# Patient Record
Sex: Female | Born: 1940 | Race: Black or African American | Hispanic: No | Marital: Single | Smoking: Never smoker
Health system: Southern US, Community
[De-identification: ages and names within clinical notes are randomized; demographics above are authoritative.]

## PROBLEM LIST (undated history)

## (undated) DIAGNOSIS — I4892 Unspecified atrial flutter: Secondary | ICD-10-CM

## (undated) DIAGNOSIS — E119 Type 2 diabetes mellitus without complications: Secondary | ICD-10-CM

## (undated) DIAGNOSIS — M3211 Endocarditis in systemic lupus erythematosus: Secondary | ICD-10-CM

## (undated) DIAGNOSIS — IMO0002 Reserved for concepts with insufficient information to code with codable children: Secondary | ICD-10-CM

## (undated) DIAGNOSIS — M1990 Unspecified osteoarthritis, unspecified site: Secondary | ICD-10-CM

## (undated) DIAGNOSIS — M329 Systemic lupus erythematosus, unspecified: Secondary | ICD-10-CM

## (undated) DIAGNOSIS — R0600 Dyspnea, unspecified: Secondary | ICD-10-CM

## (undated) DIAGNOSIS — K219 Gastro-esophageal reflux disease without esophagitis: Secondary | ICD-10-CM

## (undated) DIAGNOSIS — Z923 Personal history of irradiation: Secondary | ICD-10-CM

## (undated) DIAGNOSIS — C50919 Malignant neoplasm of unspecified site of unspecified female breast: Secondary | ICD-10-CM

## (undated) DIAGNOSIS — E039 Hypothyroidism, unspecified: Secondary | ICD-10-CM

## (undated) DIAGNOSIS — I10 Essential (primary) hypertension: Secondary | ICD-10-CM

## (undated) HISTORY — DX: Malignant neoplasm of unspecified site of unspecified female breast: C50.919

## (undated) HISTORY — DX: Personal history of irradiation: Z92.3

---

## 2007-01-27 HISTORY — PX: BREAST LUMPECTOMY: SHX2

## 2014-06-20 ENCOUNTER — Encounter (HOSPITAL_COMMUNITY): Payer: Self-pay | Admitting: Emergency Medicine

## 2014-06-20 ENCOUNTER — Emergency Department (HOSPITAL_COMMUNITY): Payer: Medicare Other

## 2014-06-20 ENCOUNTER — Emergency Department (HOSPITAL_COMMUNITY)
Admission: EM | Admit: 2014-06-20 | Discharge: 2014-06-20 | Disposition: A | Discharge status: Home / Self Care | Payer: Medicare Other | Attending: Emergency Medicine | Admitting: Emergency Medicine

## 2014-06-20 DIAGNOSIS — E119 Type 2 diabetes mellitus without complications: Secondary | ICD-10-CM | POA: Insufficient documentation

## 2014-06-20 DIAGNOSIS — R350 Frequency of micturition: Secondary | ICD-10-CM | POA: Diagnosis not present

## 2014-06-20 DIAGNOSIS — I10 Essential (primary) hypertension: Secondary | ICD-10-CM | POA: Diagnosis not present

## 2014-06-20 DIAGNOSIS — R5383 Other fatigue: Secondary | ICD-10-CM | POA: Insufficient documentation

## 2014-06-20 DIAGNOSIS — Z87440 Personal history of urinary (tract) infections: Secondary | ICD-10-CM | POA: Insufficient documentation

## 2014-06-20 DIAGNOSIS — R0602 Shortness of breath: Secondary | ICD-10-CM | POA: Diagnosis not present

## 2014-06-20 DIAGNOSIS — Z8739 Personal history of other diseases of the musculoskeletal system and connective tissue: Secondary | ICD-10-CM | POA: Diagnosis not present

## 2014-06-20 DIAGNOSIS — R05 Cough: Secondary | ICD-10-CM | POA: Diagnosis not present

## 2014-06-20 DIAGNOSIS — R112 Nausea with vomiting, unspecified: Secondary | ICD-10-CM | POA: Insufficient documentation

## 2014-06-20 DIAGNOSIS — R42 Dizziness and giddiness: Secondary | ICD-10-CM | POA: Diagnosis not present

## 2014-06-20 HISTORY — DX: Type 2 diabetes mellitus without complications: E11.9

## 2014-06-20 HISTORY — DX: Reserved for concepts with insufficient information to code with codable children: IMO0002

## 2014-06-20 HISTORY — DX: Essential (primary) hypertension: I10

## 2014-06-20 HISTORY — DX: Systemic lupus erythematosus, unspecified: M32.9

## 2014-06-20 LAB — I-STAT TROPONIN, ED: Troponin i, poc: 0 ng/mL (ref 0.00–0.08)

## 2014-06-20 LAB — CBC WITH DIFFERENTIAL/PLATELET
BASOS PCT: 0 % (ref 0–1)
Basophils Absolute: 0 10*3/uL (ref 0.0–0.1)
EOS ABS: 0 10*3/uL (ref 0.0–0.7)
EOS PCT: 1 % (ref 0–5)
HEMATOCRIT: 35 % — AB (ref 36.0–46.0)
Hemoglobin: 11.5 g/dL — ABNORMAL LOW (ref 12.0–15.0)
Lymphocytes Relative: 28 % (ref 12–46)
Lymphs Abs: 1.3 10*3/uL (ref 0.7–4.0)
MCH: 28.2 pg (ref 26.0–34.0)
MCHC: 32.9 g/dL (ref 30.0–36.0)
MCV: 85.8 fL (ref 78.0–100.0)
Monocytes Absolute: 0.3 10*3/uL (ref 0.1–1.0)
Monocytes Relative: 7 % (ref 3–12)
NEUTROS ABS: 2.9 10*3/uL (ref 1.7–7.7)
NEUTROS PCT: 64 % (ref 43–77)
Platelets: 217 10*3/uL (ref 150–400)
RBC: 4.08 MIL/uL (ref 3.87–5.11)
RDW: 14.2 % (ref 11.5–15.5)
WBC: 4.6 10*3/uL (ref 4.0–10.5)

## 2014-06-20 LAB — URINALYSIS, ROUTINE W REFLEX MICROSCOPIC
Bilirubin Urine: NEGATIVE
Glucose, UA: NEGATIVE mg/dL
Hgb urine dipstick: NEGATIVE
KETONES UR: NEGATIVE mg/dL
Nitrite: NEGATIVE
PH: 6 (ref 5.0–8.0)
PROTEIN: NEGATIVE mg/dL
Specific Gravity, Urine: 1.008 (ref 1.005–1.030)
UROBILINOGEN UA: 0.2 mg/dL (ref 0.0–1.0)

## 2014-06-20 LAB — BASIC METABOLIC PANEL
Anion gap: 9 (ref 5–15)
BUN: 17 mg/dL (ref 6–20)
CO2: 22 mmol/L (ref 22–32)
CREATININE: 1.23 mg/dL — AB (ref 0.44–1.00)
Calcium: 10.2 mg/dL (ref 8.9–10.3)
Chloride: 99 mmol/L — ABNORMAL LOW (ref 101–111)
GFR calc Af Amer: 50 mL/min — ABNORMAL LOW (ref >60)
GFR calc non Af Amer: 43 mL/min — ABNORMAL LOW (ref >60)
Glucose, Bld: 114 mg/dL — ABNORMAL HIGH (ref 65–99)
POTASSIUM: 4.6 mmol/L (ref 3.5–5.1)
SODIUM: 130 mmol/L — AB (ref 135–145)

## 2014-06-20 LAB — URINE MICROSCOPIC-ADD ON

## 2014-06-20 MED ORDER — SODIUM CHLORIDE 0.9 % IV BOLUS (SEPSIS)
1000.0000 mL | Freq: Once | INTRAVENOUS | Status: AC
  Administered 2014-06-20: 1000 mL via INTRAVENOUS

## 2014-06-20 NOTE — ED Notes (Signed)
Family requesting something to eat due to patient being diabetic.  Dr. Sherry Ruffing notified

## 2014-06-20 NOTE — ED Notes (Signed)
Pt c/o dizziness and nausea x 2 days; pt recently moved here due to decline in health to live with family; pt was recently hospitalized for urosepsis; pt denies urinary sx

## 2014-06-20 NOTE — Discharge Instructions (Signed)

## 2014-06-20 NOTE — ED Notes (Signed)
Discharge instructions reviewed with patient and daughter, voiced understanding

## 2014-06-20 NOTE — ED Provider Notes (Signed)
CSN: [id number]     Arrival date & time 06/20/14  1238 History   First MD Initiated Contact with Patient 06/20/14 1531     Chief Complaint  Patient presents with  . Dizziness   [name] is a 74 y.o. female with a past medical history significant for hypertension, diabetes, lupus, and recent multiple admissions for urosepsis discharge 2 weeks ago who presents with shortness of breath, fatigue, lightheadedness, nausea with vomiting, and frequency. The patient's currently by her daughter reports that the patient was discharged 2 weeks ago from a facility in [place] where she was found to be uroseptic. The patient says that since discharge, she is continued to feel short of breath with exertion and generalized fatigue. The patient says that she is more concerned because for the last 2 days, she has had lightheadedness with standing and occasional sensation of vertigo. The patient also has had nausea and vomiting for the last 2 days. The patient denies any fevers or chills, denies chest pain, and otherwise says she has had normal bowel movements. The patient denies dysuria, change in urine smell or color but does say she has had frequency.  The patient denies any other complaints on arrival.   (Consider location/radiation/quality/duration/timing/severity/associated sxs/prior Treatment) Patient is a 74 y.o. female presenting with dizziness. The history is provided by the patient and a relative (daughter). No language interpreter was used.  Dizziness Quality:  Head spinning and lightheadedness Severity:  Moderate Onset quality:  Gradual Duration:  2 days Timing:  Constant Progression:  Waxing and waning Chronicity:  Recurrent Context: standing up   Context: not with loss of consciousness   Relieved by:  Nothing Worsened by:  Nothing Ineffective treatments:  None tried Associated symptoms: nausea, shortness of breath and vomiting   Associated symptoms: no chest pain, no  diarrhea, no headaches, no palpitations, no syncope and no vision changes   Nausea:    Severity:  Moderate   Onset quality:  Gradual   Duration:  2 days   Timing:  Intermittent   Progression:  Waxing and waning Shortness of breath:    Severity:  Mild   Onset quality:  Gradual   Duration:  2 weeks   Timing:  Intermittent   Progression:  Waxing and waning Vomiting:    Quality:  Stomach contents   Number of occurrences:  Several   Severity:  Mild   Duration:  2 days   Timing:  Intermittent   Progression:  Unchanged   Past Medical History  Diagnosis Date  . Hypertension   . Diabetes mellitus without complication   . Lupus    History reviewed. No pertinent past surgical history. History reviewed. No pertinent family history. History  Substance Use Topics  . Smoking status: Never Smoker   . Smokeless tobacco: Not on file  . Alcohol Use: No   OB History    No data available     Review of Systems  Constitutional: Positive for fatigue. Negative for fever, chills and diaphoresis.  HENT: Negative for congestion.   Eyes: Negative for visual disturbance.  Respiratory: Positive for cough (dry) and shortness of breath. Negative for choking, chest tightness, wheezing and stridor.   Cardiovascular: Negative for chest pain, palpitations, leg swelling and syncope.  Gastrointestinal: Positive for nausea and vomiting. Negative for abdominal pain, diarrhea and constipation.  Genitourinary: Positive for frequency. Negative for dysuria and flank pain.  Musculoskeletal: Negative for back pain.  Skin: Negative for rash and wound.  Neurological: Positive  for dizziness and light-headedness. Negative for seizures, syncope and headaches.  Psychiatric/Behavioral: Negative for agitation.  All other systems reviewed and are negative.     Allergies  Ciprofloxacin  Home Medications   Prior to Admission medications   Not on File   BP 129/79 mmHg  Pulse 77  Temp(Src) 97.5 F (36.4 C)  (Oral)  Resp 18  SpO2 98% Physical Exam  Constitutional: [name], place, and time. She appears well-developed and well-nourished.  HENT:  Head: Normocephalic and atraumatic.  Mouth/Throat: No oropharyngeal exudate.  Eyes: Conjunctivae are normal. Pupils are equal, round, and reactive to light.  Neck: Normal range of motion.  Cardiovascular: Normal rate, regular rhythm, normal heart sounds and intact distal pulses.   No murmur heard. Pulmonary/Chest: Effort normal. No stridor. No respiratory distress. She has no wheezes. She exhibits no tenderness.  Abdominal: Soft. Bowel sounds are normal. She exhibits no distension. There is no tenderness. There is no rebound.  Musculoskeletal: Normal range of motion. She exhibits no tenderness.  Neurological: She is alert and oriented to person, place, and time. She exhibits normal muscle tone.  Skin: Skin is warm. She is not diaphoretic. No pallor.  Psychiatric: She has a normal mood and affect.  Nursing note and vitals reviewed.   ED Course  Procedures (including critical care time) Labs Review Labs Reviewed  BASIC METABOLIC PANEL - Abnormal; Notable for the following:    Sodium 130 (*)    Chloride 99 (*)    Glucose, Bld 114 (*)    Creatinine, Ser 1.23 (*)    GFR calc non Af Amer 43 (*)    GFR calc Af Amer 50 (*)    All other components within normal limits  CBC WITH DIFFERENTIAL/PLATELET - Abnormal; Notable for the following:    Hemoglobin 11.5 (*)    HCT 35.0 (*)    All other components within normal limits  URINALYSIS, ROUTINE W REFLEX MICROSCOPIC  I-STAT TROPOININ, ED    Imaging Review Dg Chest 2 View  06/20/2014   CLINICAL DATA:  Dizziness.  Lupus.  Hypertension.  Diabetes.  EXAM: CHEST  2 VIEW  COMPARISON:  None.  FINDINGS: Fine interstitial accentuation in the lung bases. Mildly tortuous thoracic aorta. Mild enlargement of the cardiopericardial silhouette  IMPRESSION: 1. Mild enlargement of the cardiopericardial  silhouette, without edema. 2. Fine interstitial accentuation in the lung bases. Causes other than chronic fibrosis include rheumatoid lung, idiopathic pulmonary fibrosis, drug reaction, bronchiectasis, chronic aspiration, dermatomyositis, sarcoidosis, scleroderma, and a variety of other conditions.   Electronically Signed   By: Van Clines M.D.   On: 06/20/2014 14:14     EKG Interpretation None      MDM   Caidynce Muzyka is a 74 y.o. female with a past medical history significant for hypertension, diabetes, lupus, and recent multiple admissions for urosepsis discharge 2 weeks ago who presents with shortness of breath, fatigue, lightheadedness, nausea with vomiting, and frequency. Given the patient's recent infection, there is concern for possible infectious etiology of her dizziness. The patient had diagnostic laboratory and imaging studies ordered. The patient did not have any chest pain or shortness of breath while in the ED.  The patient's diagnostic laboratory testing results are seen above. The patient's urinalysis showed moderate leukocytes however, there was many epithelial cells. The urine was nitrite negative and the patient denied other urinary symptoms aside from mild frequency. The patient reported that during her last UTI, she had dysuria which has resolved. She  is felt not to have a UTI this time. The patient's troponin was negative, her EKG did not show evidence of ischemia, Her CBC did not show a leukocytosis and her hemoglobin was 11.5. The patient's BNP showed mild hyponatremia and a creatinine of 1.2. The patient's chest x-ray did not show a focal consolidation concerning for pneumonia But did show mild enlargement of the pericardial silhouette. There was also some fine interstitial accentuation in the lung basis concerning for chronic changes.   7:57 PM On reassessment, the patient's daughter reports that the patient had a fall several days ago both in her bathroom and in the  kitchen where she hit her head on the wall. The daughter reports that the patient's dizziness worsened following that injury. In light of this information, a CT scan of the head was ordered.  The patient CT scan of the head did not show any acute abnormality aside from age-related volume loss.  The patient and her daughter were reassured with the diagnostic workup results. The patient was given fluids and was PO challenged. The patient was able to tolerate crackers, fluids, and a sandwich. The patient felt resolution of her lightheadedness following the fluid resuscitation and food. The patient was reassured that she did not appear to have an infection at this time however, she may be slightly dehydrated due to the increased fluid requirements following her recent infection. The patient was instructed to follow up with her PCP in the next several days. The patient's daughter reports that she will call them in the morning to set up an appointment. The patient was given return precautions for new or worsening symptoms. The patient and her family voice understanding of the plan of care, had no other questions, concerns, or complaints, and the patient was discharged in good condition.  This patient was seen with Dr. Aline Brochure, emergency medicine attending.    Final diagnoses:  Lightheaded       Antony Blackbird, MD 06/21/14 [id number]  Pamella Pert, MD 06/21/14 [phone]

## 2015-01-02 ENCOUNTER — Encounter (HOSPITAL_COMMUNITY): Payer: Self-pay | Admitting: Emergency Medicine

## 2015-01-02 ENCOUNTER — Inpatient Hospital Stay (HOSPITAL_COMMUNITY)
Admission: EM | Admit: 2015-01-02 | Discharge: 2015-01-06 | DRG: 864 | Disposition: A | Discharge status: Home / Self Care | Payer: Medicare Other | Attending: Internal Medicine | Admitting: Internal Medicine

## 2015-01-02 ENCOUNTER — Emergency Department (HOSPITAL_COMMUNITY): Payer: Medicare Other

## 2015-01-02 DIAGNOSIS — I129 Hypertensive chronic kidney disease with stage 1 through stage 4 chronic kidney disease, or unspecified chronic kidney disease: Secondary | ICD-10-CM | POA: Diagnosis present

## 2015-01-02 DIAGNOSIS — K219 Gastro-esophageal reflux disease without esophagitis: Secondary | ICD-10-CM | POA: Diagnosis present

## 2015-01-02 DIAGNOSIS — R7989 Other specified abnormal findings of blood chemistry: Secondary | ICD-10-CM

## 2015-01-02 DIAGNOSIS — R509 Fever, unspecified: Secondary | ICD-10-CM | POA: Diagnosis not present

## 2015-01-02 DIAGNOSIS — I10 Essential (primary) hypertension: Secondary | ICD-10-CM | POA: Diagnosis present

## 2015-01-02 DIAGNOSIS — R502 Drug induced fever: Principal | ICD-10-CM | POA: Diagnosis present

## 2015-01-02 DIAGNOSIS — E1122 Type 2 diabetes mellitus with diabetic chronic kidney disease: Secondary | ICD-10-CM | POA: Diagnosis present

## 2015-01-02 DIAGNOSIS — D638 Anemia in other chronic diseases classified elsewhere: Secondary | ICD-10-CM | POA: Diagnosis present

## 2015-01-02 DIAGNOSIS — R112 Nausea with vomiting, unspecified: Secondary | ICD-10-CM | POA: Diagnosis present

## 2015-01-02 DIAGNOSIS — M329 Systemic lupus erythematosus, unspecified: Secondary | ICD-10-CM | POA: Diagnosis present

## 2015-01-02 DIAGNOSIS — L298 Other pruritus: Secondary | ICD-10-CM | POA: Diagnosis present

## 2015-01-02 DIAGNOSIS — D649 Anemia, unspecified: Secondary | ICD-10-CM

## 2015-01-02 DIAGNOSIS — N183 Chronic kidney disease, stage 3 (moderate): Secondary | ICD-10-CM | POA: Diagnosis present

## 2015-01-02 DIAGNOSIS — R51 Headache: Secondary | ICD-10-CM | POA: Diagnosis present

## 2015-01-02 DIAGNOSIS — Y92009 Unspecified place in unspecified non-institutional (private) residence as the place of occurrence of the external cause: Secondary | ICD-10-CM

## 2015-01-02 DIAGNOSIS — T368X5A Adverse effect of other systemic antibiotics, initial encounter: Secondary | ICD-10-CM | POA: Diagnosis present

## 2015-01-02 DIAGNOSIS — IMO0002 Reserved for concepts with insufficient information to code with codable children: Secondary | ICD-10-CM | POA: Diagnosis present

## 2015-01-02 DIAGNOSIS — Z7984 Long term (current) use of oral hypoglycemic drugs: Secondary | ICD-10-CM

## 2015-01-02 DIAGNOSIS — N289 Disorder of kidney and ureter, unspecified: Secondary | ICD-10-CM

## 2015-01-02 DIAGNOSIS — E039 Hypothyroidism, unspecified: Secondary | ICD-10-CM | POA: Diagnosis present

## 2015-01-02 DIAGNOSIS — N390 Urinary tract infection, site not specified: Secondary | ICD-10-CM | POA: Diagnosis present

## 2015-01-02 DIAGNOSIS — T7840XA Allergy, unspecified, initial encounter: Secondary | ICD-10-CM

## 2015-01-02 HISTORY — DX: Hypothyroidism, unspecified: E03.9

## 2015-01-02 LAB — URINALYSIS, ROUTINE W REFLEX MICROSCOPIC
Bilirubin Urine: NEGATIVE
GLUCOSE, UA: NEGATIVE mg/dL
Hgb urine dipstick: NEGATIVE
KETONES UR: NEGATIVE mg/dL
Nitrite: NEGATIVE
Protein, ur: NEGATIVE mg/dL
Specific Gravity, Urine: 1.012 (ref 1.005–1.030)
pH: 7.5 (ref 5.0–8.0)

## 2015-01-02 LAB — CBC WITH DIFFERENTIAL/PLATELET
BASOS PCT: 0 %
Basophils Absolute: 0 10*3/uL (ref 0.0–0.1)
Eosinophils Absolute: 0 10*3/uL (ref 0.0–0.7)
Eosinophils Relative: 0 %
HEMATOCRIT: 34.5 % — AB (ref 36.0–46.0)
HEMOGLOBIN: 11.1 g/dL — AB (ref 12.0–15.0)
Lymphocytes Relative: 18 %
Lymphs Abs: 1.3 10*3/uL (ref 0.7–4.0)
MCH: 28.7 pg (ref 26.0–34.0)
MCHC: 32.2 g/dL (ref 30.0–36.0)
MCV: 89.1 fL (ref 78.0–100.0)
MONOS PCT: 3 %
Monocytes Absolute: 0.2 10*3/uL (ref 0.1–1.0)
NEUTROS ABS: 5.7 10*3/uL (ref 1.7–7.7)
Neutrophils Relative %: 79 %
Platelets: 215 10*3/uL (ref 150–400)
RBC: 3.87 MIL/uL (ref 3.87–5.11)
RDW: 14 % (ref 11.5–15.5)
WBC: 7.2 10*3/uL (ref 4.0–10.5)

## 2015-01-02 LAB — COMPREHENSIVE METABOLIC PANEL
ALT: 13 U/L — AB (ref 14–54)
AST: 25 U/L (ref 15–41)
Albumin: 4.3 g/dL (ref 3.5–5.0)
Alkaline Phosphatase: 56 U/L (ref 38–126)
Anion gap: 10 (ref 5–15)
BUN: 12 mg/dL (ref 6–20)
CALCIUM: 9.8 mg/dL (ref 8.9–10.3)
CHLORIDE: 99 mmol/L — AB (ref 101–111)
CO2: 25 mmol/L (ref 22–32)
CREATININE: 1.29 mg/dL — AB (ref 0.44–1.00)
GFR calc Af Amer: 47 mL/min — ABNORMAL LOW (ref >60)
GFR, EST NON AFRICAN AMERICAN: 40 mL/min — AB (ref >60)
Glucose, Bld: 124 mg/dL — ABNORMAL HIGH (ref 65–99)
Potassium: 3.9 mmol/L (ref 3.5–5.1)
Sodium: 134 mmol/L — ABNORMAL LOW (ref 135–145)
Total Bilirubin: 0.4 mg/dL (ref 0.3–1.2)
Total Protein: 8.4 g/dL — ABNORMAL HIGH (ref 6.5–8.1)

## 2015-01-02 LAB — I-STAT CG4 LACTIC ACID, ED
LACTIC ACID, VENOUS: 2.5 mmol/L — AB (ref 0.5–2.0)
Lactic Acid, Venous: 0.71 mmol/L (ref 0.5–2.0)

## 2015-01-02 LAB — URINE MICROSCOPIC-ADD ON

## 2015-01-02 LAB — CBG MONITORING, ED: Glucose-Capillary: 84 mg/dL (ref 65–99)

## 2015-01-02 MED ORDER — IBUPROFEN 200 MG PO TABS
200.0000 mg | ORAL_TABLET | Freq: Four times a day (QID) | ORAL | Status: DC | PRN
  Administered 2015-01-03 – 2015-01-04 (×2): 200 mg via ORAL
  Filled 2015-01-02 (×2): qty 1

## 2015-01-02 MED ORDER — PANTOPRAZOLE SODIUM 40 MG PO TBEC
40.0000 mg | DELAYED_RELEASE_TABLET | Freq: Every day | ORAL | Status: DC
  Administered 2015-01-03 – 2015-01-06 (×4): 40 mg via ORAL
  Filled 2015-01-02 (×4): qty 1

## 2015-01-02 MED ORDER — ACETAMINOPHEN 325 MG PO TABS
650.0000 mg | ORAL_TABLET | Freq: Four times a day (QID) | ORAL | Status: DC | PRN
  Administered 2015-01-03 – 2015-01-04 (×2): 650 mg via ORAL
  Filled 2015-01-02 (×3): qty 2

## 2015-01-02 MED ORDER — INSULIN ASPART 100 UNIT/ML ~~LOC~~ SOLN: 0.0000 [IU] | Freq: Three times a day (TID) | SUBCUTANEOUS | Status: DC

## 2015-01-02 MED ORDER — DEXTROSE 5 % IV SOLN
1.0000 g | Freq: Once | INTRAVENOUS | Status: AC
  Administered 2015-01-02: 1 g via INTRAVENOUS
  Filled 2015-01-02: qty 10

## 2015-01-02 MED ORDER — ONDANSETRON 4 MG PO TBDP
4.0000 mg | ORAL_TABLET | Freq: Once | ORAL | Status: AC | PRN
  Administered 2015-01-02: 4 mg via ORAL

## 2015-01-02 MED ORDER — HYDROXYCHLOROQUINE SULFATE 200 MG PO TABS
200.0000 mg | ORAL_TABLET | Freq: Every day | ORAL | Status: DC
  Administered 2015-01-03 – 2015-01-06 (×4): 200 mg via ORAL
  Filled 2015-01-02 (×4): qty 1

## 2015-01-02 MED ORDER — SODIUM CHLORIDE 0.9 % IV SOLN
INTRAVENOUS | Status: AC
  Administered 2015-01-03: via INTRAVENOUS

## 2015-01-02 MED ORDER — LEVOTHYROXINE SODIUM 50 MCG PO TABS
50.0000 ug | ORAL_TABLET | Freq: Every day | ORAL | Status: DC
  Administered 2015-01-03 – 2015-01-06 (×4): 50 ug via ORAL
  Filled 2015-01-02 (×4): qty 1

## 2015-01-02 MED ORDER — SODIUM CHLORIDE 0.9 % IV SOLN
1000.0000 mL | Freq: Once | INTRAVENOUS | Status: AC
  Administered 2015-01-02: 1000 mL via INTRAVENOUS

## 2015-01-02 MED ORDER — ACETAMINOPHEN 650 MG RE SUPP: 650.0000 mg | Freq: Four times a day (QID) | RECTAL | Status: DC | PRN

## 2015-01-02 MED ORDER — SODIUM CHLORIDE 0.9 % IV SOLN
1000.0000 mL | INTRAVENOUS | Status: DC
  Administered 2015-01-02: 1000 mL via INTRAVENOUS

## 2015-01-02 MED ORDER — ONDANSETRON 4 MG PO TBDP
ORAL_TABLET | ORAL | Status: AC
  Filled 2015-01-02: qty 1

## 2015-01-02 MED ORDER — SODIUM CHLORIDE 0.9 % IV BOLUS (SEPSIS)
500.0000 mL | Freq: Once | INTRAVENOUS | Status: AC
  Administered 2015-01-02: 500 mL via INTRAVENOUS

## 2015-01-02 MED ORDER — ENOXAPARIN SODIUM 40 MG/0.4ML ~~LOC~~ SOLN
40.0000 mg | SUBCUTANEOUS | Status: DC
  Administered 2015-01-03 – 2015-01-04 (×2): 40 mg via SUBCUTANEOUS
  Filled 2015-01-02 (×2): qty 0.4

## 2015-01-02 MED ORDER — ACETAMINOPHEN 325 MG PO TABS
650.0000 mg | ORAL_TABLET | Freq: Once | ORAL | Status: AC | PRN
  Administered 2015-01-02: 650 mg via ORAL
  Filled 2015-01-02: qty 2

## 2015-01-02 NOTE — ED Notes (Signed)
Dr. Roxanne Mins informed of patient's lactic acid level and vital signs.

## 2015-01-02 NOTE — ED Notes (Addendum)
Pt states she took a antibiotic for urinary tract infection today around 430pm and right after started itching all over and having tremors. No obvious rash is noted but pt states she just itching everywhere and also has a headache. Pt states she has itching in her throat but no difficulty speaking or trouble swallowing.

## 2015-01-02 NOTE — ED Provider Notes (Signed)
CSN: [id number]     Arrival date & time 01/02/15  1745 History   First MD Initiated Contact with Patient 01/02/15 2021     Chief Complaint  Patient presents with  . Pruritis     (Consider location/radiation/quality/duration/timing/severity/associated sxs/prior Treatment) The history is provided by the patient and a relative.   74 year old female has been having waxing and waning confusion at home. She was more confused yesterday than she had been previously. She was taken to her PCP today who did a urinalysis and diagnosed urinary tract infection and started her on ciprofloxacin. She is allergic to ciprofloxacin and started having itching following that. Family states that she is in general better today than she had been. She denies fever at home and denies chills and sweats. She denies cough. She did vomit once in triage but had not vomited prior to that. She denies constipation or diarrhea. She denies urinary urgency, frequency, tenesmus, urgency.  Past Medical History  Diagnosis Date  . Hypertension   . Diabetes mellitus without complication ([place])   . Lupus ([place])    History reviewed. No pertinent past surgical history. No family history on file. Social History  Substance Use Topics  . Smoking status: Never Smoker   . Smokeless tobacco: None  . Alcohol Use: No   OB History    No data available     Review of Systems  All other systems reviewed and are negative.     Allergies  Ciprofloxacin  Home Medications   Prior to Admission medications   Medication Sig Start Date End Date Taking? Authorizing Provider  glimepiride (AMARYL) 1 MG tablet Take 1 mg by mouth daily with breakfast.   Yes Historical Provider, MD  levothyroxine (SYNTHROID, LEVOTHROID) 50 MCG tablet Take 50 mcg by mouth daily before breakfast.   Yes Historical Provider, MD  lisinopril-hydrochlorothiazide (PRINZIDE,ZESTORETIC) 20-12.5 MG per tablet Take 1 tablet by mouth.    Yes Historical Provider, MD   omeprazole (PRILOSEC) 20 MG capsule Take 20 mg by mouth daily.   Yes Historical Provider, MD  glucose 5 G chewable tablet Chew 15 g by mouth as needed for low blood sugar.    Historical Provider, MD  hydroxychloroquine (PLAQUENIL) 200 MG tablet Take 200 mg by mouth daily.    Historical Provider, MD   BP 147/75 mmHg  Pulse 109  Temp(Src) 103.2 F (39.6 C) (Rectal)  Resp 20  Ht 5\' 6"  (1.676 m)  Wt 150 lb (68.04 kg)  BMI 24.22 kg/m2  SpO2 98% Physical Exam  Nursing note and vitals reviewed.  74 year old female, resting comfortably and in no acute distress. Vital signs are significant for fever and tachycardia and hypertension. Oxygen saturation is 98%, which is normal. Head is normocephalic and atraumatic. PERRLA, EOMI. Oropharynx is clear. Neck is nontender and supple without adenopathy or JVD. Back is nontender and there is no CVA tenderness. Lungs are clear without rales, wheezes, or rhonchi. Chest is nontender. Heart has regular rate and rhythm without murmur. Abdomen is soft, flat, nontender without masses or hepatosplenomegaly and peristalsis is normoactive. Extremities have no cyanosis or edema, full range of motion is present. Skin is warm and dry without rash. Neurologic: Mental status is normal - she is alert and oriented 3, cranial nerves are intact, there are no motor or sensory deficits.  ED Course  Procedures (including critical care time) Labs Review Results for orders placed or performed during the hospital encounter of 01/02/15  Comprehensive metabolic panel  Result Value  Ref Range   Sodium 134 (L) 135 - 145 mmol/L   Potassium 3.9 3.5 - 5.1 mmol/L   Chloride 99 (L) 101 - 111 mmol/L   CO2 25 22 - 32 mmol/L   Glucose, Bld 124 (H) 65 - 99 mg/dL   BUN 12 6 - 20 mg/dL   Creatinine, Ser 1.29 (H) 0.44 - 1.00 mg/dL   Calcium 9.8 8.9 - 10.3 mg/dL   Total Protein 8.4 (H) 6.5 - 8.1 g/dL   Albumin 4.3 3.5 - 5.0 g/dL   AST 25 15 - 41 U/L   ALT 13 (L) 14 - 54 U/L    Alkaline Phosphatase 56 38 - 126 U/L   Total Bilirubin 0.4 0.3 - 1.2 mg/dL   GFR calc non Af Amer 40 (L) >60 mL/min   GFR calc Af Amer 47 (L) >60 mL/min   Anion gap 10 5 - 15  CBC with Differential  Result Value Ref Range   WBC 7.2 4.0 - 10.5 K/uL   RBC 3.87 3.87 - 5.11 MIL/uL   Hemoglobin 11.1 (L) 12.0 - 15.0 g/dL   HCT 34.5 (L) 36.0 - 46.0 %   MCV 89.1 78.0 - 100.0 fL   MCH 28.7 26.0 - 34.0 pg   MCHC 32.2 30.0 - 36.0 g/dL   RDW 14.0 11.5 - 15.5 %   Platelets 215 150 - 400 K/uL   Neutrophils Relative % 79 %   Neutro Abs 5.7 1.7 - 7.7 K/uL   Lymphocytes Relative 18 %   Lymphs Abs 1.3 0.7 - 4.0 K/uL   Monocytes Relative 3 %   Monocytes Absolute 0.2 0.1 - 1.0 K/uL   Eosinophils Relative 0 %   Eosinophils Absolute 0.0 0.0 - 0.7 K/uL   Basophils Relative 0 %   Basophils Absolute 0.0 0.0 - 0.1 K/uL  Urinalysis, Routine w reflex microscopic (not at [facility])  Result Value Ref Range   Color, Urine YELLOW YELLOW   APPearance CLEAR CLEAR   Specific Gravity, Urine 1.012 1.005 - 1.030   pH 7.5 5.0 - 8.0   Glucose, UA NEGATIVE NEGATIVE mg/dL   Hgb urine dipstick NEGATIVE NEGATIVE   Bilirubin Urine NEGATIVE NEGATIVE   Ketones, ur NEGATIVE NEGATIVE mg/dL   Protein, ur NEGATIVE NEGATIVE mg/dL   Nitrite NEGATIVE NEGATIVE   Leukocytes, UA TRACE (A) NEGATIVE  Urine microscopic-add on  Result Value Ref Range   Squamous Epithelial / LPF 6-30 (A) NONE SEEN   WBC, UA 0-5 0 - 5 WBC/hpf   RBC / HPF 0-5 0 - 5 RBC/hpf   Bacteria, UA RARE (A) NONE SEEN  CBG monitoring, ED  Result Value Ref Range   Glucose-Capillary 84 65 - 99 mg/dL  I-Stat CG4 Lactic Acid, ED (Not at [facility])  Result Value Ref Range   Lactic Acid, Venous 2.50 (HH) 0.5 - 2.0 mmol/L   Comment NOTIFIED PHYSICIAN    Imaging Review Dg Chest 2 View  01/02/2015  CLINICAL DATA:  Fever.  Weakness.  Confusion. EXAM: CHEST  2 VIEW COMPARISON:  06/20/2014 6C FINDINGS: Mild cardiomegaly is stable. No evidence of congestive heart failure.  Chronic interstitial prominence noted in both lung bases. No evidence of acute infiltrate or edema. No evidence of pneumothorax or pleural effusion. IMPRESSION: Stable cardiomegaly and chronic bibasilar interstitial prominence. No acute findings. Electronically Signed   By: Earle Gell M.D.   On: 01/02/2015 20:37   I have personally reviewed and evaluated these images and lab results as part of my medical  decision-making.  MDM   Final diagnoses:  Elevated lactic acid level  Fever, unspecified  Renal insufficiency  Normochromic normocytic anemia    Fever with elevated lactic acid. She has minimal other criteria for sepsis. She is started on early goal-directed fluid therapy and is started on antibiotics for presumed urinary tract infection. Chest x-ray shows no evidence of pneumonia. Urine culture may not show her pathogen because of the dose of ciprofloxacin prior to coming to the ED. She is started on ceftriaxone for presumed urinary tract infection.  Urinalysis actually does not show clear evidence of UTI. WBC is normal. There is mild renal insufficiency present which is unchanged from baseline and mild anemia present unchanged from baseline. It is possible that fever is a drug fever from taking ciprofloxacin although that would not account for her elevated lactic acid. Lactic acid level will be reviewed. After completion of early goal-directed fluid therapy but patient does not appear clinically septic at this point. Case is discussed with Dr. Tamala Julian of [facility] who agrees to admit the patient under observation status.  Delora Fuel, MD [id number] [id number]

## 2015-01-03 ENCOUNTER — Encounter (HOSPITAL_COMMUNITY): Payer: Self-pay | Admitting: *Deleted

## 2015-01-03 DIAGNOSIS — M329 Systemic lupus erythematosus, unspecified: Secondary | ICD-10-CM | POA: Diagnosis present

## 2015-01-03 DIAGNOSIS — L298 Other pruritus: Secondary | ICD-10-CM | POA: Diagnosis present

## 2015-01-03 DIAGNOSIS — Y92009 Unspecified place in unspecified non-institutional (private) residence as the place of occurrence of the external cause: Secondary | ICD-10-CM | POA: Diagnosis not present

## 2015-01-03 DIAGNOSIS — N390 Urinary tract infection, site not specified: Secondary | ICD-10-CM | POA: Diagnosis present

## 2015-01-03 DIAGNOSIS — R51 Headache: Secondary | ICD-10-CM | POA: Diagnosis present

## 2015-01-03 DIAGNOSIS — T7840XA Allergy, unspecified, initial encounter: Secondary | ICD-10-CM

## 2015-01-03 DIAGNOSIS — N183 Chronic kidney disease, stage 3 (moderate): Secondary | ICD-10-CM

## 2015-01-03 DIAGNOSIS — D638 Anemia in other chronic diseases classified elsewhere: Secondary | ICD-10-CM | POA: Diagnosis present

## 2015-01-03 DIAGNOSIS — E039 Hypothyroidism, unspecified: Secondary | ICD-10-CM | POA: Diagnosis present

## 2015-01-03 DIAGNOSIS — R509 Fever, unspecified: Secondary | ICD-10-CM | POA: Diagnosis present

## 2015-01-03 DIAGNOSIS — I10 Essential (primary) hypertension: Secondary | ICD-10-CM | POA: Diagnosis present

## 2015-01-03 DIAGNOSIS — N3000 Acute cystitis without hematuria: Secondary | ICD-10-CM

## 2015-01-03 DIAGNOSIS — R112 Nausea with vomiting, unspecified: Secondary | ICD-10-CM | POA: Diagnosis present

## 2015-01-03 DIAGNOSIS — T368X5A Adverse effect of other systemic antibiotics, initial encounter: Secondary | ICD-10-CM | POA: Diagnosis present

## 2015-01-03 DIAGNOSIS — K219 Gastro-esophageal reflux disease without esophagitis: Secondary | ICD-10-CM | POA: Diagnosis present

## 2015-01-03 DIAGNOSIS — Z7984 Long term (current) use of oral hypoglycemic drugs: Secondary | ICD-10-CM | POA: Diagnosis not present

## 2015-01-03 DIAGNOSIS — R502 Drug induced fever: Secondary | ICD-10-CM | POA: Diagnosis present

## 2015-01-03 DIAGNOSIS — Z889 Allergy status to unspecified drugs, medicaments and biological substances status: Secondary | ICD-10-CM | POA: Diagnosis not present

## 2015-01-03 DIAGNOSIS — I129 Hypertensive chronic kidney disease with stage 1 through stage 4 chronic kidney disease, or unspecified chronic kidney disease: Secondary | ICD-10-CM | POA: Diagnosis present

## 2015-01-03 DIAGNOSIS — E1122 Type 2 diabetes mellitus with diabetic chronic kidney disease: Secondary | ICD-10-CM | POA: Diagnosis present

## 2015-01-03 LAB — CBC
HCT: 31.6 % — ABNORMAL LOW (ref 36.0–46.0)
Hemoglobin: 10 g/dL — ABNORMAL LOW (ref 12.0–15.0)
MCH: 28.2 pg (ref 26.0–34.0)
MCHC: 31.6 g/dL (ref 30.0–36.0)
MCV: 89 fL (ref 78.0–100.0)
PLATELETS: 208 10*3/uL (ref 150–400)
RBC: 3.55 MIL/uL — AB (ref 3.87–5.11)
RDW: 13.9 % (ref 11.5–15.5)
WBC: 7.3 10*3/uL (ref 4.0–10.5)

## 2015-01-03 LAB — BASIC METABOLIC PANEL
ANION GAP: 7 (ref 5–15)
BUN: 10 mg/dL (ref 6–20)
CALCIUM: 8.4 mg/dL — AB (ref 8.9–10.3)
CO2: 22 mmol/L (ref 22–32)
Chloride: 106 mmol/L (ref 101–111)
Creatinine, Ser: 1.12 mg/dL — ABNORMAL HIGH (ref 0.44–1.00)
GFR calc Af Amer: 55 mL/min — ABNORMAL LOW (ref >60)
GFR, EST NON AFRICAN AMERICAN: 48 mL/min — AB (ref >60)
Glucose, Bld: 132 mg/dL — ABNORMAL HIGH (ref 65–99)
POTASSIUM: 3.8 mmol/L (ref 3.5–5.1)
SODIUM: 135 mmol/L (ref 135–145)

## 2015-01-03 LAB — TSH: TSH: 1.489 u[IU]/mL (ref 0.350–4.500)

## 2015-01-03 LAB — GLUCOSE, CAPILLARY
GLUCOSE-CAPILLARY: 115 mg/dL — AB (ref 65–99)
GLUCOSE-CAPILLARY: 66 mg/dL (ref 65–99)
GLUCOSE-CAPILLARY: 82 mg/dL (ref 65–99)
Glucose-Capillary: 103 mg/dL — ABNORMAL HIGH (ref 65–99)
Glucose-Capillary: 91 mg/dL (ref 65–99)
Glucose-Capillary: 90 mg/dL (ref 65–99)

## 2015-01-03 MED ORDER — DIPHENHYDRAMINE HCL 50 MG/ML IJ SOLN: 25.0000 mg | Freq: Four times a day (QID) | INTRAMUSCULAR | Status: DC | PRN

## 2015-01-03 MED ORDER — HYDROCHLOROTHIAZIDE 12.5 MG PO CAPS
12.5000 mg | ORAL_CAPSULE | Freq: Every day | ORAL | Status: DC
  Administered 2015-01-03 – 2015-01-06 (×4): 12.5 mg via ORAL
  Filled 2015-01-03 (×4): qty 1

## 2015-01-03 MED ORDER — ONDANSETRON HCL 4 MG/2ML IJ SOLN: 4.0000 mg | Freq: Four times a day (QID) | INTRAMUSCULAR | Status: DC | PRN

## 2015-01-03 MED ORDER — DEXTROSE 5 % IV SOLN
1.0000 g | INTRAVENOUS | Status: DC
  Administered 2015-01-03 – 2015-01-05 (×3): 1 g via INTRAVENOUS
  Filled 2015-01-03 (×4): qty 10

## 2015-01-03 MED ORDER — SODIUM CHLORIDE 0.9 % IV SOLN
INTRAVENOUS | Status: DC
  Administered 2015-01-03 – 2015-01-05 (×3): via INTRAVENOUS

## 2015-01-03 MED ORDER — LISINOPRIL-HYDROCHLOROTHIAZIDE 20-12.5 MG PO TABS: 1.0000 | ORAL_TABLET | Freq: Every day | ORAL | Status: DC

## 2015-01-03 MED ORDER — LISINOPRIL 20 MG PO TABS
20.0000 mg | ORAL_TABLET | Freq: Every day | ORAL | Status: DC
Start: 2015-01-03 — End: 2015-01-06
  Administered 2015-01-03 – 2015-01-06 (×4): 20 mg via ORAL
  Filled 2015-01-03 (×4): qty 1

## 2015-01-03 NOTE — Progress Notes (Signed)
Changes made to assessment due to charting on wrong patient. Dorita Fray, RN, BSN 01/03/2015 11:20 AM

## 2015-01-03 NOTE — Progress Notes (Signed)
[facility] PROGRESS NOTE  [name] [id number] DOB: 01-07-1941 DOA: 01/02/2015 PCP: Benito Mccreedy, MD  Assessment/Plan: 1. Drug allergy -Patient recent diagnosed with urinary tract infection by her primary care provider, prescribed pruritus which led to the development of generalized pruritus associated with nausea vomiting. -Ciprofloxacin has been discontinued. She is currently receiving IV ceftriaxone for her urinary tract infection. -She remains hemodynamically stable  2.  Urinary tract infection. -Overnight she spiked a temperature of 103.2, this morning having temperature of 101.1. -She is ill-appearing on exam, will continue empiric IV antibiotic coverage with ceftriaxone 1 g IV every 24 hours. Urinalysis revealed trace leukocytes with rare bacteria however this was obtained after taking antibiotic therapy. -It is also possible fevers may be related to drug reaction -Plan to monitor her overnight    Code Status: Full code Family Communication: Family not present Disposition Plan: Will monitor overnight, it's been discharge in morning if she remains stable.  Antibiotics:  Ceftriaxone  HPI/Subjective: [name] is a pleasant 74 y/o with a past medical history of hypertension, lupus, hypothyroidism presented to the emergency department overnight with complaints of generalized pruritus. She was diagnosed with a urinary tract infection by her primary care provider and prescribed ciprofloxacin. After taking first dose of this medication she developed generalized pruritus associated with headaches, nausea and vomiting. On presentation she was found to have a temperature of 103.2 with lactic acid of 2.5.  Objective: Filed Vitals:   01/03/15 0937 01/03/15 1424  BP: 122/68 123/61  Pulse:  86  Temp:  99.4 F (37.4 C)  Resp:  16    Intake/Output Summary (Last 24 hours) at 01/03/15 1612 Last data filed at 01/03/15 1545  Gross per 24 hour  Intake 3072.08 ml  Output     300 ml  Net 2772.08 ml   Filed Weights   01/02/15 1754 01/03/15 1100  Weight: 68.04 kg (150 lb) 69.7 kg (153 lb 10.6 oz)    Exam:   General:  Ill-appearing, although no acute distress. She was ambulated down the hallway and back.  Cardiovascular: Regular rate rhythm normal S1-S2 no murmurs or gallops  Respiratory: Normal respiratory effort, lungs are clear to auscultation bilaterally  Abdomen: Soft nontender nondistended  Musculoskeletal: No edema  Data Reviewed: Basic Metabolic Panel:  Recent Labs Lab 01/02/15 1955 01/03/15 0251  NA 134* 135  K 3.9 3.8  CL 99* 106  CO2 25 22  GLUCOSE 124* 132*  BUN 12 10  CREATININE 1.29* 1.12*  CALCIUM 9.8 8.4*   Liver Function Tests:  Recent Labs Lab 01/02/15 1955  AST 25  ALT 13*  ALKPHOS 56  BILITOT 0.4  PROT 8.4*  ALBUMIN 4.3   No results for input(s): LIPASE, AMYLASE in the last 168 hours. No results for input(s): AMMONIA in the last 168 hours. CBC:  Recent Labs Lab 01/02/15 1955 01/03/15 0251  WBC 7.2 7.3  NEUTROABS 5.7  --   HGB 11.1* 10.0*  HCT 34.5* 31.6*  MCV 89.1 89.0  PLT 215 208   Cardiac Enzymes: No results for input(s): CKTOTAL, CKMB, CKMBINDEX, TROPONINI in the last 168 hours. BNP (last 3 results) No results for input(s): BNP in the last 8760 hours.  ProBNP (last 3 results) No results for input(s): PROBNP in the last 8760 hours.  CBG:  Recent Labs Lab 01/02/15 1939 01/03/15 0804 01/03/15 1137  GLUCAP 84 115* 103*    Recent Results (from the past 240 hour(s))  Culture, blood (routine x 2)     Status:  None (Preliminary result)   Collection Time: 01/02/15  7:55 PM  Result Value Ref Range Status   Specimen Description BLOOD RIGHT ARM  Final   Special Requests BOTTLES DRAWN AEROBIC AND ANAEROBIC 5CC  Final   Culture NO GROWTH < 24 HOURS  Final   Report Status PENDING  Incomplete  Culture, blood (routine x 2)     Status: None (Preliminary result)   Collection Time: 01/02/15   7:55 PM  Result Value Ref Range Status   Specimen Description BLOOD LEFT ARM  Final   Special Requests BOTTLES DRAWN AEROBIC AND ANAEROBIC 5CC  Final   Culture NO GROWTH < 24 HOURS  Final   Report Status PENDING  Incomplete  Urine culture     Status: None (Preliminary result)   Collection Time: 01/02/15  9:08 PM  Result Value Ref Range Status   Specimen Description URINE, CLEAN CATCH  Final   Special Requests NONE  Final   Culture NO GROWTH < 24 HOURS  Final   Report Status PENDING  Incomplete     Studies: Dg Chest 2 View  01/02/2015  CLINICAL DATA:  Fever.  Weakness.  Confusion. EXAM: CHEST  2 VIEW COMPARISON:  06/20/2014 6C FINDINGS: Mild cardiomegaly is stable. No evidence of congestive heart failure. Chronic interstitial prominence noted in both lung bases. No evidence of acute infiltrate or edema. No evidence of pneumothorax or pleural effusion. IMPRESSION: Stable cardiomegaly and chronic bibasilar interstitial prominence. No acute findings. Electronically Signed   By: Earle Gell M.D.   On: 01/02/2015 20:37    Scheduled Meds: . cefTRIAXone (ROCEPHIN)  IV  1 g Intravenous Q24H  . enoxaparin (LOVENOX) injection  40 mg Subcutaneous Q24H  . hydrochlorothiazide  12.5 mg Oral Daily  . hydroxychloroquine  200 mg Oral Daily  . insulin aspart  0-9 Units Subcutaneous TID WC  . levothyroxine  50 mcg Oral QAC breakfast  . lisinopril  20 mg Oral Daily  . pantoprazole  40 mg Oral Daily   Continuous Infusions: . sodium chloride 75 mL/hr at 01/03/15 1401    Principal Problem:   Drug-induced hypersensitivity reaction Active Problems:   Pyrexia   Hypothyroidism   HTN (hypertension)   UTI (urinary tract infection)   Lupus ([place])    Time spent:     Kelvin Cellar  [facility] Pager [phone]. If 7PM-7AM, please contact night-coverage at [email or web address], password [facility] 01/03/2015, 4:12 PM  LOS: 0 days

## 2015-01-03 NOTE — Progress Notes (Signed)
Patient family member complained of minimal swelling to pt's left side of face and bottom lip. RN noted that pt was sleeping on that side for the last couple of hours. Pt also complained of stiff neck. RN repositioned patient and informed patient and family member that we will monitor the next couple hours to see if the reposition of pt neck would give relief to stiff neck. RN will report off to night nurse to monitor pt's condition. Dorita Fray 01/03/2015 6:59 PM

## 2015-01-03 NOTE — H&P (Addendum)
[facility] History and Physical  [name] [id number] DOB: January 04, 1941 DOA: 01/02/2015  Referring physician: ED PCP: Benito Mccreedy, MD   Chief Complaint: Itching all over   HPI:  74 year old female with a past medical history of HTN, lupus, hypothyroidism, diabetes mellitus; who presents with complaints of  Itching, nausea, and vomiting. Patient went to her primary care office today for routine follow-up. Patient had PCP check her urine and she has a previous history of UTIs that have led her to have hospitalizations as bacteria can get in her blood. Patient denied any symptoms of dysuria, frequency, odor, or fever. Her doctor at that time performed a UA which appeared to be positive for bacteria. She was started on ciprofloxacin although patient did not recall at that time that she was allergic to this medicine. She took one dose and developed acute onset of itching all over, and felt as though her throat was getting tight. She reports associated symptoms of headache, nausea, vomiting, and shakes.   Upon arrival to the emergency department she was seen have elevated fever 103.69F, heart rate of 109, Lactic acid level of 2.5 on initial lab work. UA was positive for small amount of leukocyte estrace.   Review of Systems  Constitutional: Positive for fever, chills, malaise/fatigue and diaphoresis.  HENT: Negative for ear discharge.   Eyes: Negative for double vision and pain.  Respiratory: Positive for shortness of breath. Negative for hemoptysis and wheezing.   Cardiovascular: Negative for orthopnea and leg swelling.  Gastrointestinal: Positive for nausea and vomiting. Negative for diarrhea and constipation.  Genitourinary: Negative for dysuria, frequency, hematuria and flank pain.  Musculoskeletal: Positive for joint pain. Negative for myalgias.  Skin: Positive for itching. Negative for rash.  Neurological: Positive for tremors and headaches.      Past Medical History   Diagnosis Date  . Hypertension   . Diabetes mellitus without complication ([place])   . Lupus ([place])      History reviewed. No pertinent past surgical history.    Social History:  reports that she has never smoked. She does not have any smokeless tobacco history on file. She reports that she does not drink alcohol or use illicit drugs. Where does patient live--home Can patient participate in ADLs? Yes   Allergies  Allergen Reactions  . Ciprofloxacin Itching, Swelling and Other (See Comments)    Possibly causing tremors?    No family history on file.      Prior to Admission medications   Medication Sig Start Date End Date Taking? Authorizing Provider  glimepiride (AMARYL) 1 MG tablet Take 1 mg by mouth daily with breakfast.   Yes Historical Provider, MD  levothyroxine (SYNTHROID, LEVOTHROID) 50 MCG tablet Take 50 mcg by mouth daily before breakfast.   Yes Historical Provider, MD  lisinopril-hydrochlorothiazide (PRINZIDE,ZESTORETIC) 20-12.5 MG per tablet Take 1 tablet by mouth.    Yes Historical Provider, MD  omeprazole (PRILOSEC) 20 MG capsule Take 20 mg by mouth daily.   Yes Historical Provider, MD  glucose 5 G chewable tablet Chew 15 g by mouth as needed for low blood sugar.    Historical Provider, MD  hydroxychloroquine (PLAQUENIL) 200 MG tablet Take 200 mg by mouth daily.    Historical Provider, MD     Physical Exam: Filed Vitals:   01/02/15 2315 01/02/15 2330 01/02/15 2345 01/03/15 0000  BP: 115/69 127/69 127/75   Pulse:      Temp:    101.1 F (38.4 C)  TempSrc:    Rectal  Resp:   25   Height:      Weight:      SpO2:         Constitutional: Vital signs reviewed. Patient is a well-developed and well-nourished in no acute distress and cooperative with exam. Alert and oriented x3.  Head: Normocephalic and atraumatic  Ear: TM normal bilaterally  Mouth: no erythema or exudates, MMM  Eyes: PERRL, EOMI, conjunctivae normal, No scleral icterus.  Neck: Supple, Trachea  midline normal ROM, No JVD, mass, thyromegaly, or carotid bruit present.  Cardiovascular: RRR, S1 normal, S2 normal, no MRG, pulses symmetric and intact bilaterally  Pulmonary/Chest: CTAB, no wheezes, rales, or rhonchi  Abdominal: Soft. Non-tender, non-distended, bowel sounds are normal, no masses, organomegaly, or guarding present.  GU: no CVA tenderness Musculoskeletal: No joint deformities, erythema, or stiffness, ROM full and no nontender Ext: no edema and no cyanosis, pulses palpable bilaterally (DP and PT)  Hematology: no cervical, inginal, or axillary adenopathy.  Neurological: A&O x3, Strenght is normal and symmetric bilaterally, cranial nerve II-XII are grossly intact, no focal motor deficit, sensory intact to light touch bilaterally.  Skin: Warm, dry and intact. No rash, cyanosis, or clubbing.  Psychiatric: Normal mood and affect. speech and behavior is normal. Judgment and thought content normal. Cognition and memory are normal.      Data Review   Micro Results No results found for this or any previous visit (from the past 240 hour(s)).  Radiology Reports Dg Chest 2 View  01/02/2015  CLINICAL DATA:  Fever.  Weakness.  Confusion. EXAM: CHEST  2 VIEW COMPARISON:  06/20/2014 6C FINDINGS: Mild cardiomegaly is stable. No evidence of congestive heart failure. Chronic interstitial prominence noted in both lung bases. No evidence of acute infiltrate or edema. No evidence of pneumothorax or pleural effusion. IMPRESSION: Stable cardiomegaly and chronic bibasilar interstitial prominence. No acute findings. Electronically Signed   By: Earle Gell M.D.   On: 01/02/2015 20:37     CBC  Recent Labs Lab 01/02/15 1955  WBC 7.2  HGB 11.1*  HCT 34.5*  PLT 215  MCV 89.1  MCH 28.7  MCHC 32.2  RDW 14.0  LYMPHSABS 1.3  MONOABS 0.2  EOSABS 0.0  BASOSABS 0.0    Chemistries   Recent Labs Lab 01/02/15 1955  NA 134*  K 3.9  CL 99*  CO2 25  GLUCOSE 124*  BUN 12  CREATININE 1.29*   CALCIUM 9.8  AST 25  ALT 13*  ALKPHOS 56  BILITOT 0.4   ------------------------------------------------------------------------------------------------------------------ estimated creatinine clearance is 36.9 mL/min (by C-G formula based on Cr of 1.29). ------------------------------------------------------------------------------------------------------------------ No results for input(s): HGBA1C in the last 72 hours. ------------------------------------------------------------------------------------------------------------------ No results for input(s): CHOL, HDL, LDLCALC, TRIG, CHOLHDL, LDLDIRECT in the last 72 hours. ------------------------------------------------------------------------------------------------------------------ No results for input(s): TSH, T4TOTAL, T3FREE, THYROIDAB in the last 72 hours.  Invalid input(s): FREET3 ------------------------------------------------------------------------------------------------------------------ No results for input(s): VITAMINB12, FOLATE, FERRITIN, TIBC, IRON, RETICCTPCT in the last 72 hours.  Coagulation profile No results for input(s): INR, PROTIME in the last 168 hours.  No results for input(s): DDIMER in the last 72 hours.  Cardiac Enzymes No results for input(s): CKMB, TROPONINI, MYOGLOBIN in the last 168 hours.  Invalid input(s): CK ------------------------------------------------------------------------------------------------------------------ Invalid input(s): POCBNP   CBG:  Recent Labs Lab 01/02/15 1939  GLUCAP 84      EKG   Assessment/Plan Principal Problem: Suspected  Drug-induced hypersensitivity reaction: Patient's acute symptoms of itching with fever immediately following taking ciprofloxacin with known drug allergy. Lactic acid elevated  at 2.5 on admission question if this is so with drug allergies. Initial CBC showing a normal differential. -Advised patient on the need to avoid ciprofloxacin  and possibly fluoroquinolones as a class  -Symptomatic therapy including IV fluids -Benadryl when necessary itching -Zofran when necessary for nausea or vomiting -Continue to monitor overnight possible DC in a.m.  UTI (urinary tract infection): Present on admission. Patient with no complaints of dysuria, frequency, or urgency. UA at outpatient office positive. Status post one doses ciprofloxacin repeat urinalysis on admission showing only trace leukocyte esterase. Patient reports history of UTIs leading to hospitalizations and bacteremia. Patient received 1 dose of IM Rocephin in ED. Also on the differential included the possibility of sepsis as it too could relate to symptoms of fever, nausea, and vomiting.  -Urine culture ordered -F/u Blood cultures 2 -Question need of continued antibiotics and patient was suffering from a simple uncomplicated UTI  Pyrexia: Fever as high as 103.21F suspect related to ciprofloxacin less likely related to UTI. -Tylenol when necessary fever    Lupus: Patient notes that she has not been on this medicine for a week or so due to the recent move..Patient with history of lupus which could have effects on kidney function as well as hemoglobin and hematocrit. -Continue hydroxychloroquine -Care management consult    Hypothyroidism -Check TSH   -Continue home dose of levothyroxine  HTN (hypertension) -Continue lisinopril hydrochlorothiazide   Diabetes mellitus type 2: Patient reports only being on oral hypoglycemic agent. Unaware of last hemoglobin A1c. -Checking hemoglobin A1c -Held home medication glimepiride -CBG checks every before meals -Sensitive sliding scale insulin as needed Chronic kidney disease stage III: Stable baseline kidney functions seem to be 1.23 back in May 2016 and relatively unchanged on presentation today. -CBC repeat in a.m.  Anemia of chronic disease: H&H 11.1 and 34.5 respectively. Appears close to baseline as previous H&H was 11.5  and 35 and May 2016.. -Continue to monitor CBC repeat in a.m.  GERD -Protonix  Code Status:   full Family Communication: bedside Disposition Plan: admit   Total time spent 55 minutes.Greater than 50% of this time was spent in counseling, explanation of diagnosis, planning of further management, and coordination of care  [facility] Pager [phone]  If 7PM-7AM, please contact night-coverage [email or web address] Password [email or web address] 01/03/2015, 12:06 AM

## 2015-01-03 NOTE — Care Management Note (Signed)
Case Management Note  Patient Details  Name: [name] MRN: [id number] Date of Birth: 03-05-1940  Subjective/Objective:  Date:01/03/15 Spoke with patient at the bedside along with [name], daughter [phone].  Introduced self as Tourist information centre manager and explained role in discharge planning and how to be reached.  Verified patient lives in town, alone with daughter.  Expressed potential need for tub bench and 3 n 1 DME.  Verified patient anticipates to go home with family,  at time of discharge and will have full-time supervision by family  at this time to best of their knowledge.  Patient denied needing help with their medication.  Patient is driven by daughter to MD appointments.  Verified patient has PCP Iona Beard Osei-Bonsu.  NCM gave patient brochure for outpatient Neuro Rehabilitation.    Plan: CM will continue to follow for discharge planning and [facility] resources.                   Action/Plan:   Expected Discharge Date:                  Expected Discharge Plan:  Home/Self Care  In-House Referral:     Discharge planning Services  CM Consult  Post Acute Care Choice:    Choice offered to:     DME Arranged:    DME Agency:     HH Arranged:    [place] Agency:     Status of Service:  Completed, signed off  Medicare Important Message Given:    Date Medicare IM Given:    Medicare IM give by:    Date Additional Medicare IM Given:    Additional Medicare Important Message give by:     If discussed at [place] of Stay Meetings, dates discussed:    Additional Comments:  Zenon Mayo, RN 01/03/2015, 2:46 PM

## 2015-01-03 NOTE — Progress Notes (Signed)
[name] [id number] Code Status:Full code   Admission Data: 01/03/2015 4:46 AM Attending Provider:  Fuller Plan [id number], MD Consults/ Treatment Team:    [name] is a 74 y.o. female patient admitted from ED awake, alert - oriented  X 3 - no acute distress noted.  VSS - Blood pressure 98/49, pulse 90, temperature 100.1 F (37.8 C), temperature source Oral, resp. rate 15, height 5\' 6"  (1.676 m), weight 68.04 kg (150 lb), SpO2 98 %.    IV in place, occlusive dsg intact without redness.  Orientation to room, and floor completed with information packet given to patient/family.  Patient declined safety video at this time.  Admission INP armband ID verified with patient/family, and in place.   SR up x 2, fall assessment complete, with patient and family able to verbalize understanding of risk associated with falls, and verbalized understanding to call nsg before up out of bed.  Call light within reach, patient able to voice, and demonstrate understanding.  Skin, clean-dry- intact without evidence of bruising, or skin tears.   No evidence of skin break down noted on exam.     Will cont to eval and treat per MD orders.  Vertell Limber, RN 01/03/2015 4:46 AM  \

## 2015-01-03 NOTE — Evaluation (Signed)
Physical Therapy Evaluation Patient Details Name: Ettel Sipos MRN: [id number] DOB: 1940/11/14 Today's Date: 01/03/2015   History of Present Illness  Pt is a 74 y/o female with a PMH significant for HTN, lupus, hypothyroidism, DM. She presents with complaints of itching, nausea, and vomiting after taking an antibiotic that she did not recall she was allergic to when the MD prescribed it. She took one dose and developed acute symptoms.  Clinical Impression  Pt admitted with above diagnosis. Pt currently with functional limitations due to the deficits listed below (see PT Problem List). At the time of PT eval pt was able to perform transfers and ambulation with supervision to min assist for balance. Pt's daughter present during session and reports that she is not at her baseline of function and is typically very active. Pt will benefit from skilled PT to increase their independence and safety with mobility to allow discharge to the venue listed below.       Follow Up Recommendations Outpatient PT;Supervision for mobility/OOB    Equipment Recommendations  Cane;3in1 (PT) (vs. tub bench)    Recommendations for Other Services       Precautions / Restrictions Precautions Precautions: Fall Restrictions Weight Bearing Restrictions: No      Mobility  Bed Mobility Overal bed mobility: Modified Independent             General bed mobility comments: No use of bed rails required. Increased time to transition fully to EOB.   Transfers Overall transfer level: Needs assistance Equipment used: None Transfers: Sit to/from Stand Sit to Stand: Supervision         General transfer comment: Supervision for safety as pt appeared very guarded and somewhat unsteady.   Ambulation/Gait Ambulation/Gait assistance: Min assist Ambulation Distance (Feet): 200 Feet Assistive device: None Gait Pattern/deviations: Step-through pattern;Decreased stride length;Narrow base of support;Decreased  dorsiflexion - right;Decreased dorsiflexion - left Gait velocity: Decreased Gait velocity interpretation: Below normal speed for age/gender General Gait Details: Pt almost shuffling feet during ambulation, but taking consistently short, narrow steps. Pt was not able to make corrective changes with cues. At end of gait training, pt began taking steps and becoming increasingly more off balance. Assist was required with the gait belt to recover.  Stairs            Wheelchair Mobility    Modified Rankin (Stroke Patients Only)       Balance Overall balance assessment: Needs assistance Sitting-balance support: Feet supported;No upper extremity supported Sitting balance-Leahy Scale: Fair     Standing balance support: No upper extremity supported;During functional activity Standing balance-Leahy Scale: Fair                               Pertinent Vitals/Pain Pain Assessment: Faces Faces Pain Scale: Hurts even more Pain Location: Headache Pain Intervention(s): Limited activity within patient's tolerance;Monitored during session;Repositioned    Home Living Family/patient expects to be discharged to:: Private residence Living Arrangements: Children Available Help at Discharge: Family Type of Home: House Home Access: Stairs to enter Entrance Stairs-Rails: None Technical brewer of Steps: 1 Home Layout: Multi-level Home Equipment: None      Prior Function Level of Independence: Independent               Hand Dominance        Extremity/Trunk Assessment   Upper Extremity Assessment: Defer to OT evaluation (Pain with AROM overhead)  Lower Extremity Assessment: Generalized weakness (sudden release of tension during MMT of quads)      Cervical / Trunk Assessment: Normal  Communication   Communication: Expressive difficulties (Pt grossly does not speak during session.)  Cognition Arousal/Alertness: Lethargic Behavior During Therapy:  Flat affect Overall Cognitive Status: Impaired/Different from baseline Area of Impairment: Orientation;Attention;Following commands;Safety/judgement;Awareness;Problem solving Orientation Level: Disoriented to;Time;Situation Current Attention Level: Sustained   Following Commands: Follows one step commands with increased time;Follows one step commands inconsistently Safety/Judgement: Decreased awareness of safety;Decreased awareness of deficits Awareness: Emergent Problem Solving: Slow processing;Decreased initiation;Difficulty sequencing;Requires verbal cues General Comments: Pt initially reports it is May instead of December. Does not speak unless therapist insists that she answer questions. Daughter reports this is not her baseline.    General Comments      Exercises        Assessment/Plan    PT Assessment Patient needs continued PT services  PT Diagnosis Difficulty walking;Generalized weakness   PT Problem List Decreased strength;Decreased range of motion;Decreased activity tolerance;Decreased balance;Decreased mobility;Decreased knowledge of use of DME;Decreased safety awareness;Decreased knowledge of precautions;Pain  PT Treatment Interventions DME instruction;Gait training;Stair training;Functional mobility training;Therapeutic activities;Therapeutic exercise;Neuromuscular re-education;Patient/family education   PT Goals (Current goals can be found in the Care Plan section) Acute Rehab PT Goals Patient Stated Goal: Pt did not state goals during session PT Goal Formulation: With patient/family Time For Goal Achievement: 01/10/15 Potential to Achieve Goals: Good    Frequency Min 3X/week   Barriers to discharge        Co-evaluation               End of Session Equipment Utilized During Treatment: Gait belt Activity Tolerance: Patient limited by fatigue Patient left: in bed;with call bell/phone within reach;with bed alarm set;with family/visitor present Nurse  Communication: Mobility status    Functional Assessment Tool Used: Clinical judgement Functional Limitation: Mobility: Walking and moving around Mobility: Walking and Moving Around Current Status [id number]): At least 1 percent but less than 20 percent impaired, limited or restricted Mobility: Walking and Moving Around Goal Status [phone]): At least 1 percent but less than 20 percent impaired, limited or restricted    Time: [id number] PT Time Calculation (min) (ACUTE ONLY): 29 min   Charges:   PT Evaluation $Initial PT Evaluation Tier I: 1 Procedure PT Treatments $Gait Training: 8-22 mins   PT G Codes:   PT G-Codes **NOT FOR INPATIENT CLASS** Functional Assessment Tool Used: Clinical judgement Functional Limitation: Mobility: Walking and moving around Mobility: Walking and Moving Around Current Status [id number]): At least 1 percent but less than 20 percent impaired, limited or restricted Mobility: Walking and Moving Around Goal Status [phone]): At least 1 percent but less than 20 percent impaired, limited or restricted    Rolinda Roan 01/03/2015, 12:29 PM   Rolinda Roan, PT, DPT Acute Rehabilitation Services Pager: [phone]

## 2015-01-04 ENCOUNTER — Inpatient Hospital Stay (HOSPITAL_COMMUNITY): Payer: Medicare Other

## 2015-01-04 DIAGNOSIS — R502 Drug induced fever: Principal | ICD-10-CM

## 2015-01-04 LAB — GLUCOSE, CAPILLARY
GLUCOSE-CAPILLARY: 86 mg/dL (ref 65–99)
GLUCOSE-CAPILLARY: 94 mg/dL (ref 65–99)
GLUCOSE-CAPILLARY: 111 mg/dL — AB (ref 65–99)
Glucose-Capillary: 111 mg/dL — ABNORMAL HIGH (ref 65–99)

## 2015-01-04 LAB — URINE CULTURE
CULTURE: NO GROWTH
Culture: NO GROWTH

## 2015-01-04 LAB — BASIC METABOLIC PANEL
ANION GAP: 5 (ref 5–15)
BUN: 12 mg/dL (ref 6–20)
CALCIUM: 8.8 mg/dL — AB (ref 8.9–10.3)
CO2: 24 mmol/L (ref 22–32)
Chloride: 105 mmol/L (ref 101–111)
Creatinine, Ser: 1.11 mg/dL — ABNORMAL HIGH (ref 0.44–1.00)
GFR calc Af Amer: 56 mL/min — ABNORMAL LOW (ref >60)
GFR, EST NON AFRICAN AMERICAN: 48 mL/min — AB (ref >60)
GLUCOSE: 98 mg/dL (ref 65–99)
POTASSIUM: 3.7 mmol/L (ref 3.5–5.1)
SODIUM: 134 mmol/L — AB (ref 135–145)

## 2015-01-04 LAB — INFLUENZA PANEL BY PCR (TYPE A & B)
H1N1FLUPCR: NOT DETECTED
Influenza A By PCR: NEGATIVE
Influenza B By PCR: NEGATIVE

## 2015-01-04 LAB — CBC
HCT: 31.1 % — ABNORMAL LOW (ref 36.0–46.0)
Hemoglobin: 9.9 g/dL — ABNORMAL LOW (ref 12.0–15.0)
MCH: 28.5 pg (ref 26.0–34.0)
MCHC: 31.8 g/dL (ref 30.0–36.0)
MCV: 89.6 fL (ref 78.0–100.0)
PLATELETS: 183 10*3/uL (ref 150–400)
RBC: 3.47 MIL/uL — AB (ref 3.87–5.11)
RDW: 13.9 % (ref 11.5–15.5)
WBC: 6.2 10*3/uL (ref 4.0–10.5)

## 2015-01-04 LAB — HEMOGLOBIN A1C
Hgb A1c MFr Bld: 6.6 % — ABNORMAL HIGH (ref 4.8–5.6)
MEAN PLASMA GLUCOSE: 143 mg/dL

## 2015-01-04 NOTE — Consult Note (Signed)
NEURO HOSPITALIST CONSULT NOTE   Requestig physician: Dr. Coralyn Pear   Reason for Consult:Fever and headache  HPI:                                                                                                                                          Brandan Sockwell is an 74 y.o. female with history of HTN, DM, Lupus, and Hypothyroidism with presented with complaints of generalized pruritis after recently starting ciprofloxocin for outpatient treatment of a possible UTI.  She also reports complaints of nausea, vomiting, and headaches that started around the same time as she took the ciprofloxacin.  In the ED yesterday she was noted to have a fever of 103, a lactic acid of 2.5 without any leukocytosis.  She was diagnosed with a drug reaction and admitted to the hosptial.  Her U/A at that time was not very convincing for a UTI however given her acute illness her antibiotics were changed to rocephin.   Today she reports she is feeling better.  She has not had further nausea and vomiting and her puritis has improved.  She has had a headache but she reports it is now easing off.  She does previously note some neck "pressure" but reports that is now gone.  She does admit some generalized weakness which she attributes to spending all day in bed.  Overnight she has continued to have a low grade fever which has been treated with tylenol and ibuprofen.  Past Medical History  Diagnosis Date  . Hypertension   . Diabetes mellitus without complication ([place])   . Lupus ([place])   . Hypothyroidism     History reviewed. No pertinent past surgical history.  History reviewed. No pertinent family history.   Social History:  reports that she has never smoked. Her smokeless tobacco use includes Snuff. She reports that she does not drink alcohol or use illicit drugs.  Allergies  Allergen Reactions  . Ciprofloxacin Itching, Swelling and Other (See Comments)    Possibly causing tremors?     MEDICATIONS:                                                                                                                     I have reviewed the patient's current medications.   ROS:  History obtained from the patient  General ROS: positive for fatigue, fever negative for - chills, night sweats, weight gain or weight loss Psychological ROS: negative for -  depression Ophthalmic ROS: negative for - blurry vision, double vision, eye pain or loss of vision ENT ROS: negative for -  nasal discharge, oral lesions, sore throat, tinnitus or vertigo Allergy and Immunology ROS: negative for - hives or itchy/watery eyes Hematological and Lymphatic ROS: negative for - bleeding problems, bruising or swollen lymph nodes Endocrine ROS: negative for - polydipsia/polyuria or temperature intolerance Respiratory ROS: negative for - cough, hemoptysis, shortness of breath or wheezing Cardiovascular ROS: negative for - chest pain, dyspnea on exertion, edema or irregular heartbeat Gastrointestinal ROS: positive for nausea/vomiting negative for - abdominal pain, diarrhea, hematemesis,  or stool incontinence Genito-Urinary ROS: negative for - dysuria, hematuria, incontinence or urinary frequency/urgency Musculoskeletal ROS: negative for - joint swelling or muscular weakness Neurological ROS: as noted in HPI Dermatological ROS: negative for rash and skin lesion changes   Blood pressure 145/83, pulse 73, temperature 98.4 F (36.9 C), temperature source Oral, resp. rate 20, height 5\' 6"  (1.676 m), weight 153 lb 10.6 oz (69.7 kg), SpO2 100 %.   Neurologic Examination:                                                                                                      HEENT-  Normocephalic, no lesions, without obvious abnormality.  Normal external eye and conjunctiva.  Normal  TM's bilaterally.  Normal auditory canals and external ears. Normal external nose, mucus membranes and septum.  Normal pharynx. Cardiovascular- regular rate and rhythm, S1, S2 normal, no murmur, click, rub or gallop, pulses palpable throughout   Lungs- chest clear, no wheezing, rales, normal symmetric air entry, Heart exam - S1, S2 normal, no murmur, no gallop, rate regular Abdomen- soft, non-tender; bowel sounds normal; no masses,  no organomegaly Extremities- no edema Lymph-no adenopathy palpable Musculoskeletal- no neck tenderness. Negative kernig and Brudzinski's signs Skin-warm and dry, no hyperpigmentation, vitiligo, or suspicious lesions  Neurological Examination Mental Status: Alert, oriented, thought content appropriate.  Speech fluent without evidence of aphasia.  Able to follow 3 step commands without difficulty.   Cranial Nerves: II: Visual fields grossly normal, pupils equal, round, reactive to light and accommodation III,IV, VI: ptosis not present, extra-ocular motions intact bilaterally V,VII: smile symmetric, facial light touch sensation normal bilaterally  VIII: hearing normal bilaterally IX,X: uvula rises symmetrically XI: bilateral shoulder shrug XII: midline tongue extension Motor: Right : Upper extremity   5/5    Left:     Upper extremity   5/5  Lower extremity   5/5     Lower extremity   5/5 Tone and bulk:normal tone throughout; no atrophy noted Sensory: Pinprick and light touch intact throughout, bilaterally Deep Tendon Reflexes: 2+ and symmetric throughout Plantars: Right: downgoing   Left: downgoing Cerebellar: normal finger-to-nose Gait: not assessed      Lab Results: Basic Metabolic Panel:  Recent Labs Lab 01/02/15 1955 01/03/15 0251 01/04/15 1135  NA 134* 135 134*  K 3.9  3.8 3.7  CL 99* 106 105  CO2 25 22 24   GLUCOSE 124* 132* 98  BUN 12 10 12   CREATININE 1.29* 1.12* 1.11*  CALCIUM 9.8 8.4* 8.8*    Liver Function Tests:  Recent  Labs Lab 01/02/15 1955  AST 25  ALT 13*  ALKPHOS 56  BILITOT 0.4  PROT 8.4*  ALBUMIN 4.3   No results for input(s): LIPASE, AMYLASE in the last 168 hours. No results for input(s): AMMONIA in the last 168 hours.  CBC:  Recent Labs Lab 01/02/15 1955 01/03/15 0251 01/04/15 1135  WBC 7.2 7.3 6.2  NEUTROABS 5.7  --   --   HGB 11.1* 10.0* 9.9*  HCT 34.5* 31.6* 31.1*  MCV 89.1 89.0 89.6  PLT 215 208 183    Cardiac Enzymes: No results for input(s): CKTOTAL, CKMB, CKMBINDEX, TROPONINI in the last 168 hours.  Lipid Panel: No results for input(s): CHOL, TRIG, HDL, CHOLHDL, VLDL, LDLCALC in the last 168 hours.  CBG:  Recent Labs Lab 01/03/15 1720 01/03/15 2017 01/03/15 2139 01/04/15 0810 01/04/15 1135  GLUCAP 82 91 90 86 94    Microbiology: Results for orders placed or performed during the hospital encounter of 01/02/15  Culture, blood (routine x 2)     Status: None (Preliminary result)   Collection Time: 01/02/15  7:55 PM  Result Value Ref Range Status   Specimen Description BLOOD RIGHT ARM  Final   Special Requests BOTTLES DRAWN AEROBIC AND ANAEROBIC 5CC  Final   Culture NO GROWTH 2 DAYS  Final   Report Status PENDING  Incomplete  Culture, blood (routine x 2)     Status: None (Preliminary result)   Collection Time: 01/02/15  7:55 PM  Result Value Ref Range Status   Specimen Description BLOOD LEFT ARM  Final   Special Requests BOTTLES DRAWN AEROBIC AND ANAEROBIC 5CC  Final   Culture NO GROWTH 2 DAYS  Final   Report Status PENDING  Incomplete  Urine culture     Status: None   Collection Time: 01/02/15  9:08 PM  Result Value Ref Range Status   Specimen Description URINE, CLEAN CATCH  Final   Special Requests NONE  Final   Culture NO GROWTH 2 DAYS  Final   Report Status 01/04/2015 FINAL  Final  Urine culture     Status: None   Collection Time: 01/03/15  1:55 AM  Result Value Ref Range Status   Specimen Description URINE, CLEAN CATCH  Final   Special Requests  NONE  Final   Culture NO GROWTH 1 DAY  Final   Report Status 01/04/2015 FINAL  Final    Coagulation Studies: No results for input(s): LABPROT, INR in the last 72 hours.  Imaging: Dg Chest 2 View  01/04/2015  CLINICAL DATA:  Fever EXAM: CHEST  2 VIEW COMPARISON:  2 days ago FINDINGS: Tubing overlaps the chest. Chronic mild cardiomegaly and aortic tortuosity. Stable coarsened appearance of markings at the bases, dating back to at least 06/20/2014. No edema, effusion, or pneumothorax. Upper mediastinal widening and distortion in the setting of rotation. IMPRESSION: Mild scarring at the bases.  No definitive pneumonia. Electronically Signed   By: Monte Fantasia M.D.   On: 01/04/2015 12:16   Dg Chest 2 View  01/02/2015  CLINICAL DATA:  Fever.  Weakness.  Confusion. EXAM: CHEST  2 VIEW COMPARISON:  06/20/2014 6C FINDINGS: Mild cardiomegaly is stable. No evidence of congestive heart failure. Chronic interstitial prominence noted in both lung bases. No evidence  of acute infiltrate or edema. No evidence of pneumothorax or pleural effusion. IMPRESSION: Stable cardiomegaly and chronic bibasilar interstitial prominence. No acute findings. Electronically Signed   By: Earle Gell M.D.   On: 01/02/2015 20:37   Assessment/Plan: 74 year old female with history of HTN, DM and Lupus who presents with fevers and headaches after taking ciprofloxacin.  Pyrexia: - U/A nor symptoms very convincing for a UTI however she is on empiric treatment and did receive cipro PTA.  She does not have any meningeal signs but has been on rocephin.  She does have a history of lupus but does not have any other symptoms of a possible lupus flare.  Fevers could be caused by the potential drug reaction from Cipro.   Lucious Groves, DO IMTS PGY-3 Pager: [phone] 01/04/2015, 31:62 PM   73 year old female with headache/fever. Her headache does a pretty improving and she does not have any clear meningismus, but there is no clear source  of fever and the fact that her predominant symptom was headache with nausea is concerning. It is possible that this improved due to treatment, but she received Lovenox earlier today. With her rapidly improving and no current definite evidence for meningismus, would not favor pursuing lumbar puncture while still on Lovenox. If she were to worsen, then could broaden antibiotics from parameningeal coverage, otherwise we'll plan on considering lumbar puncture tomorrow.  Roland Rack, MD [facility] [phone]  If 7pm- 7am, please page neurology on call as listed in [place].

## 2015-01-04 NOTE — Progress Notes (Signed)
PT Cancellation Note  Patient Details Name: Hilder Mattiello MRN: [id number] DOB: 10/03/1940   Cancelled Treatment:    Reason Eval/Treat Not Completed: Medical issues which prohibited therapy;Patient at procedure or test/unavailable (high fever and at procedure).  Will try again Monday.   Ramond Dial 01/04/2015, 1:26 PM   Mee Hives, PT MS Acute Rehab Dept. Number: [phone] [id number] and [place] [phone]

## 2015-01-04 NOTE — Progress Notes (Signed)
[facility] PROGRESS NOTE  [name] [id number] DOB: 02/11/1940 DOA: 01/02/2015 PCP: Benito Mccreedy, MD  Assessment/Plan: 1. Drug allergy -Patient recent diagnosed with urinary tract infection by her primary care provider, prescribed pruritus which led to the development of generalized pruritus associated with nausea vomiting. -Ciprofloxacin has been discontinued.   2. Febrile Illness -Overnight patient again spiked a temperature of 103. Labs reviewed, white count remains within normal limits at 6,200. I checked a flu swab which came back negative. A repeat 2 view chest x-ray did not show evidence of pneumonia. -She complained of headache yesterday which I thought could be related to UTI or perhaps allergic reaction. She continues to have headache and today reported to me having "neck pain". -I'm not certain if febrile illnesses related to urinary tract infection or allergic reaction and I am concerned about other possibilities such as a viral meningitis. -I spoke with neurology regarding performing lumbar puncture. She is on Lovenox for which lumbar puncture will need to be scheduled for tomorrow morning. -For now will continue with present antibiotic therapy with ceftriaxone.  2.  Possible Urinary tract infection. -Patient continues to spike high temperatures of 103. -She is ill-appearing on exam, will continue empiric IV antibiotic coverage with ceftriaxone 1 g IV every 24 hours. Urinalysis revealed trace leukocytes with rare bacteria however this was obtained after taking antibiotic therapy. -Given presence of headache and "neck pain and "working her up for other possible causes of infection, pending lumbar puncture.  Code Status: Full code Family Communication: I spoke with her daughter who was present at bedside Disposition Plan: Plan for lumbar puncture in a.m.  Antibiotics:  Ceftriaxone  HPI/Subjective: [name] is a pleasant 74 y/o with a past medical history  of hypertension, lupus, hypothyroidism presented to the emergency department overnight with complaints of generalized pruritus. She was diagnosed with a urinary tract infection by her primary care provider and prescribed ciprofloxacin. After taking first dose of this medication she developed generalized pruritus associated with headaches, nausea and vomiting. On presentation she was found to have a temperature of 103.2 with lactic acid of 2.5.  Objective: Filed Vitals:   01/04/15 1138 01/04/15 1337  BP:  145/83  Pulse:  73  Temp: 100 F (37.8 C) 98.4 F (36.9 C)  Resp:  20    Intake/Output Summary (Last 24 hours) at 01/04/15 1528 Last data filed at 01/04/15 1400  Gross per 24 hour  Intake 2307.5 ml  Output    100 ml  Net 2207.5 ml   Filed Weights   01/02/15 1754 01/03/15 1100  Weight: 68.04 kg (150 lb) 69.7 kg (153 lb 10.6 oz)    Exam:   General:  Ill-appearing, although no acute distress. .  Cardiovascular: Regular rate rhythm normal S1-S2 no murmurs or gallops  Respiratory: Normal respiratory effort, lungs are clear to auscultation bilaterally  Abdomen: Soft nontender nondistended  Musculoskeletal: No edema  Data Reviewed: Basic Metabolic Panel:  Recent Labs Lab 01/02/15 1955 01/03/15 0251 01/04/15 1135  NA 134* 135 134*  K 3.9 3.8 3.7  CL 99* 106 105  CO2 25 22 24   GLUCOSE 124* 132* 98  BUN 12 10 12   CREATININE 1.29* 1.12* 1.11*  CALCIUM 9.8 8.4* 8.8*   Liver Function Tests:  Recent Labs Lab 01/02/15 1955  AST 25  ALT 13*  ALKPHOS 56  BILITOT 0.4  PROT 8.4*  ALBUMIN 4.3   No results for input(s): LIPASE, AMYLASE in the last 168 hours. No results for input(s): AMMONIA  in the last 168 hours. CBC:  Recent Labs Lab 01/02/15 1955 01/03/15 0251 01/04/15 1135  WBC 7.2 7.3 6.2  NEUTROABS 5.7  --   --   HGB 11.1* 10.0* 9.9*  HCT 34.5* 31.6* 31.1*  MCV 89.1 89.0 89.6  PLT 215 208 183   Cardiac Enzymes: No results for input(s): CKTOTAL,  CKMB, CKMBINDEX, TROPONINI in the last 168 hours. BNP (last 3 results) No results for input(s): BNP in the last 8760 hours.  ProBNP (last 3 results) No results for input(s): PROBNP in the last 8760 hours.  CBG:  Recent Labs Lab 01/03/15 1720 01/03/15 2017 01/03/15 2139 01/04/15 0810 01/04/15 1135  GLUCAP 82 91 90 86 94    Recent Results (from the past 240 hour(s))  Culture, blood (routine x 2)     Status: None (Preliminary result)   Collection Time: 01/02/15  7:55 PM  Result Value Ref Range Status   Specimen Description BLOOD RIGHT ARM  Final   Special Requests BOTTLES DRAWN AEROBIC AND ANAEROBIC 5CC  Final   Culture NO GROWTH 2 DAYS  Final   Report Status PENDING  Incomplete  Culture, blood (routine x 2)     Status: None (Preliminary result)   Collection Time: 01/02/15  7:55 PM  Result Value Ref Range Status   Specimen Description BLOOD LEFT ARM  Final   Special Requests BOTTLES DRAWN AEROBIC AND ANAEROBIC 5CC  Final   Culture NO GROWTH 2 DAYS  Final   Report Status PENDING  Incomplete  Urine culture     Status: None   Collection Time: 01/02/15  9:08 PM  Result Value Ref Range Status   Specimen Description URINE, CLEAN CATCH  Final   Special Requests NONE  Final   Culture NO GROWTH 2 DAYS  Final   Report Status 01/04/2015 FINAL  Final  Urine culture     Status: None   Collection Time: 01/03/15  1:55 AM  Result Value Ref Range Status   Specimen Description URINE, CLEAN CATCH  Final   Special Requests NONE  Final   Culture NO GROWTH 1 DAY  Final   Report Status 01/04/2015 FINAL  Final     Studies: Dg Chest 2 View  01/04/2015  CLINICAL DATA:  Fever EXAM: CHEST  2 VIEW COMPARISON:  2 days ago FINDINGS: Tubing overlaps the chest. Chronic mild cardiomegaly and aortic tortuosity. Stable coarsened appearance of markings at the bases, dating back to at least 06/20/2014. No edema, effusion, or pneumothorax. Upper mediastinal widening and distortion in the setting of  rotation. IMPRESSION: Mild scarring at the bases.  No definitive pneumonia. Electronically Signed   By: Monte Fantasia M.D.   On: 01/04/2015 12:16   Dg Chest 2 View  01/02/2015  CLINICAL DATA:  Fever.  Weakness.  Confusion. EXAM: CHEST  2 VIEW COMPARISON:  06/20/2014 6C FINDINGS: Mild cardiomegaly is stable. No evidence of congestive heart failure. Chronic interstitial prominence noted in both lung bases. No evidence of acute infiltrate or edema. No evidence of pneumothorax or pleural effusion. IMPRESSION: Stable cardiomegaly and chronic bibasilar interstitial prominence. No acute findings. Electronically Signed   By: Earle Gell M.D.   On: 01/02/2015 20:37    Scheduled Meds: . cefTRIAXone (ROCEPHIN)  IV  1 g Intravenous Q24H  . hydrochlorothiazide  12.5 mg Oral Daily  . hydroxychloroquine  200 mg Oral Daily  . insulin aspart  0-9 Units Subcutaneous TID WC  . levothyroxine  50 mcg Oral QAC breakfast  . lisinopril  20 mg Oral Daily  . pantoprazole  40 mg Oral Daily   Continuous Infusions: . sodium chloride 75 mL/hr at 01/03/15 1401    Principal Problem:   Drug-induced hypersensitivity reaction Active Problems:   Pyrexia   Hypothyroidism   HTN (hypertension)   UTI (urinary tract infection)   Lupus ([place])    Time spent: 35 min    Kelvin Cellar  [facility] Pager [phone]. If 7PM-7AM, please contact night-coverage at [email or web address], password [email or web address] 01/04/2015, 3:28 PM  LOS: 1 day

## 2015-01-05 DIAGNOSIS — R509 Fever, unspecified: Secondary | ICD-10-CM

## 2015-01-05 DIAGNOSIS — Z889 Allergy status to unspecified drugs, medicaments and biological substances status: Secondary | ICD-10-CM

## 2015-01-05 LAB — CBC WITH DIFFERENTIAL/PLATELET
BASOS ABS: 0 10*3/uL (ref 0.0–0.1)
Basophils Relative: 0 %
EOS PCT: 2 %
Eosinophils Absolute: 0.1 10*3/uL (ref 0.0–0.7)
HEMATOCRIT: 32.5 % — AB (ref 36.0–46.0)
HEMOGLOBIN: 10.4 g/dL — AB (ref 12.0–15.0)
LYMPHS PCT: 32 %
Lymphs Abs: 1.7 10*3/uL (ref 0.7–4.0)
MCH: 28.7 pg (ref 26.0–34.0)
MCHC: 32 g/dL (ref 30.0–36.0)
MCV: 89.5 fL (ref 78.0–100.0)
MONO ABS: 0.4 10*3/uL (ref 0.1–1.0)
MONOS PCT: 7 %
Neutro Abs: 3 10*3/uL (ref 1.7–7.7)
Neutrophils Relative %: 59 %
Platelets: 180 10*3/uL (ref 150–400)
RBC: 3.63 MIL/uL — ABNORMAL LOW (ref 3.87–5.11)
RDW: 13.8 % (ref 11.5–15.5)
WBC: 5.2 10*3/uL (ref 4.0–10.5)

## 2015-01-05 LAB — GLUCOSE, CAPILLARY
GLUCOSE-CAPILLARY: 133 mg/dL — AB (ref 65–99)
GLUCOSE-CAPILLARY: 121 mg/dL — AB (ref 65–99)
GLUCOSE-CAPILLARY: 127 mg/dL — AB (ref 65–99)
Glucose-Capillary: 79 mg/dL (ref 65–99)

## 2015-01-05 NOTE — Progress Notes (Signed)
Subjective: Headache remains much improved and fever curve is trendgin dow.   Exam: Filed Vitals:   01/05/15 0558 01/05/15 0907  BP: 143/90 133/59  Pulse: 65 60  Temp: 98.7 F (37.1 C) 97.7 F (36.5 C)  Resp: 18 18   Gen: In bed, NAD  Neuro: MS: awake, interactive and appropriate.    Impression: 74 yo F with head ache and fever without meningeal signs. With improvement and no menengial signs, I think that menigitis is relatively unlikely. I would not favor an LP at this time unless she were to have re-worsening of her headache or her fever without clear cause. If these were to happen, however, then could pursue LP at that time.   Recommendations: 1) No further recs at this time, please call with any further questions.   Roland Rack, MD [facility] [phone]  If 7pm- 7am, please page neurology on call as listed in [place].

## 2015-01-05 NOTE — Progress Notes (Signed)
[facility] PROGRESS NOTE  [name] [id number] DOB: 03-Sep-1940 DOA: 01/02/2015 PCP: Kathryn Mccreedy, MD  Assessment/Plan: 1. Drug allergy -Patient recent diagnosed with urinary tract infection by her primary care provider, prescribed pruritus which led to the development of generalized pruritus associated with nausea vomiting. -Ciprofloxacin has been discontinued.   2. Febrile Illness -Overnight patient again spiked a temperature of 103. Labs reviewed, white count remains within normal limits at 6,200. I checked a flu swab which came back negative. A repeat 2 view chest x-ray did not show evidence of pneumonia. -She complained of headache yesterday which I thought could be related to UTI or perhaps allergic reaction. She continued to have headache and on 01/04/2015 reported to me having "neck pain". -I'm not certain if febrile illnesses related to urinary tract infection or allergic reaction and I am concerned about other possibilities such as a viral meningitis for which Neurology was consulted for LP. -On 01/05/2015 case discussed with Neurology. She has shown significant improvement without further temp spikes and resolution of headaches. Dr Leonel Ramsay not recommending LP today and to continue monitoring for further fevers  2.  Possible Urinary tract infection. -Patient continues to spike high temperatures of 103. -She is ill-appearing on exam, will continue empiric IV antibiotic coverage with ceftriaxone 1 g IV every 24 hours. Urinalysis revealed trace leukocytes with rare bacteria however this was obtained after taking antibiotic therapy. -On ceftriaxone  Code Status: Full code Family Communication: I spoke with her daughter who was present at bedside Disposition Plan: Plan for 1 more day on Ceftriaxone. If she remains afebrile plan to discharge in am.   Antibiotics:  Ceftriaxone  HPI/Subjective: [name] is a pleasant 74 y/o with a past medical history of  hypertension, lupus, hypothyroidism presented to the emergency department overnight with complaints of generalized pruritus. She was diagnosed with a urinary tract infection by her primary care provider and prescribed ciprofloxacin. After taking first dose of this medication she developed generalized pruritus associated with headaches, nausea and vomiting. On presentation she was found to have a temperature of 103.2 with lactic acid of 2.5.  Objective: Filed Vitals:   01/05/15 0907 01/05/15 1320  BP: 133/59 121/78  Pulse: 60 65  Temp: 97.7 F (36.5 C) 98.7 F (37.1 C)  Resp: 18 20    Intake/Output Summary (Last 24 hours) at 01/05/15 1716 Last data filed at 01/05/15 1600  Gross per 24 hour  Intake   2550 ml  Output   1000 ml  Net   1550 ml   Filed Weights   01/02/15 1754 01/03/15 1100  Weight: 68.04 kg (150 lb) 69.7 kg (153 lb 10.6 oz)    Exam:   General:  Looks better, ambulated down the hallway with me.   Cardiovascular: Regular rate rhythm normal S1-S2 no murmurs or gallops  Respiratory: Normal respiratory effort, lungs are clear to auscultation bilaterally  Abdomen: Soft nontender nondistended  Musculoskeletal: No edema  Data Reviewed: Basic Metabolic Panel:  Recent Labs Lab 01/02/15 1955 01/03/15 0251 01/04/15 1135  NA 134* 135 134*  K 3.9 3.8 3.7  CL 99* 106 105  CO2 25 22 24   GLUCOSE 124* 132* 98  BUN 12 10 12   CREATININE 1.29* 1.12* 1.11*  CALCIUM 9.8 8.4* 8.8*   Liver Function Tests:  Recent Labs Lab 01/02/15 1955  AST 25  ALT 13*  ALKPHOS 56  BILITOT 0.4  PROT 8.4*  ALBUMIN 4.3   No results for input(s): LIPASE, AMYLASE in the last 168 hours.  No results for input(s): AMMONIA in the last 168 hours. CBC:  Recent Labs Lab 01/02/15 1955 01/03/15 0251 01/04/15 1135 01/05/15 0931  WBC 7.2 7.3 6.2 5.2  NEUTROABS 5.7  --   --  3.0  HGB 11.1* 10.0* 9.9* 10.4*  HCT 34.5* 31.6* 31.1* 32.5*  MCV 89.1 89.0 89.6 89.5  PLT 215 208 183 180    Cardiac Enzymes: No results for input(s): CKTOTAL, CKMB, CKMBINDEX, TROPONINI in the last 168 hours. BNP (last 3 results) No results for input(s): BNP in the last 8760 hours.  ProBNP (last 3 results) No results for input(s): PROBNP in the last 8760 hours.  CBG:  Recent Labs Lab 01/04/15 1705 01/04/15 2155 01/05/15 0813 01/05/15 1138 01/05/15 1648  GLUCAP 111* 111* 79 133* 121*    Recent Results (from the past 240 hour(s))  Culture, blood (routine x 2)     Status: None (Preliminary result)   Collection Time: 01/02/15  7:55 PM  Result Value Ref Range Status   Specimen Description BLOOD RIGHT ARM  Final   Special Requests BOTTLES DRAWN AEROBIC AND ANAEROBIC 5CC  Final   Culture NO GROWTH 3 DAYS  Final   Report Status PENDING  Incomplete  Culture, blood (routine x 2)     Status: None (Preliminary result)   Collection Time: 01/02/15  7:55 PM  Result Value Ref Range Status   Specimen Description BLOOD LEFT ARM  Final   Special Requests BOTTLES DRAWN AEROBIC AND ANAEROBIC 5CC  Final   Culture NO GROWTH 3 DAYS  Final   Report Status PENDING  Incomplete  Urine culture     Status: None   Collection Time: 01/02/15  9:08 PM  Result Value Ref Range Status   Specimen Description URINE, CLEAN CATCH  Final   Special Requests NONE  Final   Culture NO GROWTH 2 DAYS  Final   Report Status 01/04/2015 FINAL  Final  Urine culture     Status: None   Collection Time: 01/03/15  1:55 AM  Result Value Ref Range Status   Specimen Description URINE, CLEAN CATCH  Final   Special Requests NONE  Final   Culture NO GROWTH 1 DAY  Final   Report Status 01/04/2015 FINAL  Final     Studies: Dg Chest 2 View  01/04/2015  CLINICAL DATA:  Fever EXAM: CHEST  2 VIEW COMPARISON:  2 days ago FINDINGS: Tubing overlaps the chest. Chronic mild cardiomegaly and aortic tortuosity. Stable coarsened appearance of markings at the bases, dating back to at least 06/20/2014. No edema, effusion, or pneumothorax.  Upper mediastinal widening and distortion in the setting of rotation. IMPRESSION: Mild scarring at the bases.  No definitive pneumonia. Electronically Signed   By: Monte Fantasia M.D.   On: 01/04/2015 12:16    Scheduled Meds: . cefTRIAXone (ROCEPHIN)  IV  1 g Intravenous Q24H  . hydrochlorothiazide  12.5 mg Oral Daily  . hydroxychloroquine  200 mg Oral Daily  . insulin aspart  0-9 Units Subcutaneous TID WC  . levothyroxine  50 mcg Oral QAC breakfast  . lisinopril  20 mg Oral Daily  . pantoprazole  40 mg Oral Daily   Continuous Infusions: . sodium chloride 75 mL/hr at 01/05/15 1119    Principal Problem:   Drug-induced hypersensitivity reaction Active Problems:   Pyrexia   Hypothyroidism   HTN (hypertension)   UTI (urinary tract infection)   Lupus ([place])    Time spent: 25 min    Shawnte Demarest  [facility] Pager [phone]. If 7PM-7AM, please contact night-coverage at [email or web address], password [facility] 01/05/2015, 5:16 PM  LOS: 2 days

## 2015-01-06 LAB — GLUCOSE, CAPILLARY
GLUCOSE-CAPILLARY: 88 mg/dL (ref 65–99)
GLUCOSE-CAPILLARY: 113 mg/dL — AB (ref 65–99)

## 2015-01-06 MED ORDER — HYDROXYCHLOROQUINE SULFATE 200 MG PO TABS: 200.0000 mg | ORAL_TABLET | Freq: Every day | ORAL | Status: DC

## 2015-01-06 MED ORDER — CEFUROXIME AXETIL 250 MG PO TABS: 250.0000 mg | ORAL_TABLET | Freq: Two times a day (BID) | ORAL | Status: DC

## 2015-01-06 NOTE — Care Management Important Message (Signed)
Important Message  Patient Details  Name: Ebonie Grissett MRN: [id number] Date of Birth: 21-Sep-1940   Medicare Important Message Given:  Yes    Apolonio Schneiders, RN 01/06/2015, 10:56 AM

## 2015-01-06 NOTE — Progress Notes (Signed)
Discussed discharge instructions with patient and daughter, including the following:  medication list, when to call the MD, antibiotic therapy, [place] instructions, follow up appointments, and My chart information.  Patient discharged to private residence via private vehicle accompanied by friend and daughter.  Escorted to exit via wheelchair by nurse tech.

## 2015-01-06 NOTE — Progress Notes (Signed)
Physician Discharge Summary  [name] [id number] DOB: 05-Aug-1940 DOA: 01/02/2015  PCP: Benito Mccreedy, MD  Admit date: 01/02/2015 Discharge date: 01/06/2015  Time spent: 35 minutes  Recommendations for Outpatient Follow-up:  1. Patient admitted for febrile illness, could be related to UTI vs drug reaction to Cipro. Discharged on Ceftin 250 mg PO BID 2. She was set up with [facility] on discharge   Discharge Diagnoses:  Principal Problem:   Drug-induced hypersensitivity reaction Active Problems:   Pyrexia   Hypothyroidism   HTN (hypertension)   UTI (urinary tract infection)   Lupus ([facility])   Fever, unspecified   Discharge Condition: Stable  Diet recommendation: Heart Healthy  Filed Weights   01/02/15 1754 01/03/15 1100  Weight: 68.04 kg (150 lb) 69.7 kg (153 lb 10.6 oz)    History of present illness:  74 year old female with a past medical history of HTN, lupus, hypothyroidism, diabetes mellitus; who presents with complaints of Itching, nausea, and vomiting. Patient went to her primary care office today for routine follow-up. Patient had PCP check her urine and she has a previous history of UTIs that have led her to have hospitalizations as bacteria can get in her blood. Patient denied any symptoms of dysuria, frequency, odor, or fever. Her doctor at that time performed a UA which appeared to be positive for bacteria. She was started on ciprofloxacin although patient did not recall at that time that she was allergic to this medicine. She took one dose and developed acute onset of itching all over, and felt as though her throat was getting tight. She reports associated symptoms of headache, nausea, vomiting, and shakes.   Upon arrival to the emergency department she was seen have elevated fever 103.18F, heart rate of 109, Lactic acid level of 2.5 on initial lab work. UA was positive for small amount of leukocyte estrace.  Hospital Course:  Mrs Siragusa is a pleasant 74  y/o with a past medical history of hypertension, lupus, hypothyroidism presented to the emergency department overnight with complaints of generalized pruritus. She was diagnosed with a urinary tract infection by her primary care provider and prescribed ciprofloxacin. After taking first dose of this medication she developed generalized pruritus associated with headaches, nausea and vomiting. On presentation she was found to have a temperature of 103.2 with lactic acid of 2.5.   Drug allergy -Patient recent diagnosed with urinary tract infection by her primary care provider, prescribed pruritus which led to the development of generalized pruritus associated with nausea vomiting. -Ciprofloxacin has been discontinued.   2. Febrile Illness -Overnight patient again spiked a temperature of 103. Labs reviewed, white count remains within normal limits at 6,200. I checked a flu swab which came back negative. A repeat 2 view chest x-ray did not show evidence of pneumonia. -She complained of headache which I thought could be related to UTI or perhaps allergic reaction. She continued to have headache and on 01/04/2015 reported to me having "neck pain". -It was unclear to me if febrile illnesses related to urinary tract infection or allergic reaction and concerned other possibilities such as a viral meningitis for which Neurology was consulted for LP. -On 01/05/2015 case discussed with Neurology. She has shown significant improvement without further temp spikes and resolution of headaches. Dr Leonel Ramsay NOT recommending LP today and to continue monitoring for further fevers -On 01/06/2015 she had not spiked any fevers in the past 24 hours -Discharged on Ceftin 250 mg PO BID  2. Possible Urinary tract infection. -Patient continues to  spike high temperatures of 103. -She is ill-appearing on exam, will continue empiric IV antibiotic coverage with ceftriaxone 1 g IV every 24 hours. Urinalysis revealed trace  leukocytes with rare bacteria however this was obtained after taking antibiotic therapy. -Discharged on Ceftin 250 mg PO BID   Discharge Exam: Filed Vitals:   01/05/15 2135 01/06/15 0530  BP: 152/73 150/75  Pulse: 65 62  Temp: 98.5 F (36.9 C) 98.8 F (37.1 C)  Resp: 15 15     General: Looks better, nontoxic sitting at edge of bed.    Cardiovascular: Regular rate rhythm normal S1-S2 no murmurs or gallops  Respiratory: Normal respiratory effort, lungs are clear to auscultation bilaterally  Abdomen: Soft nontender nondistended  Musculoskeletal: No edema  Discharge Instructions   Discharge Instructions    Call MD for:  difficulty breathing, headache or visual disturbances    Complete by:  As directed      Call MD for:  extreme fatigue    Complete by:  As directed      Call MD for:  hives    Complete by:  As directed      Call MD for:  persistant dizziness or light-headedness    Complete by:  As directed      Call MD for:  persistant nausea and vomiting    Complete by:  As directed      Call MD for:  redness, tenderness, or signs of infection (pain, swelling, redness, odor or green/yellow discharge around incision site)    Complete by:  As directed      Call MD for:  severe uncontrolled pain    Complete by:  As directed      Call MD for:  temperature >100.4    Complete by:  As directed      Call MD for:    Complete by:  As directed      Diet - low sodium heart healthy    Complete by:  As directed      Increase activity slowly    Complete by:  As directed           Current Discharge Medication List    START taking these medications   Details  cefUROXime (CEFTIN) 250 MG tablet Take 1 tablet (250 mg total) by mouth 2 (two) times daily with a meal. Qty: 8 tablet, Refills: 0      CONTINUE these medications which have NOT CHANGED   Details  levothyroxine (SYNTHROID, LEVOTHROID) 50 MCG tablet Take 50 mcg by mouth daily before breakfast.     lisinopril-hydrochlorothiazide (PRINZIDE,ZESTORETIC) 20-12.5 MG per tablet Take 1 tablet by mouth.     omeprazole (PRILOSEC) 20 MG capsule Take 20 mg by mouth daily.    hydroxychloroquine (PLAQUENIL) 200 MG tablet Take 200 mg by mouth daily.      STOP taking these medications     glimepiride (AMARYL) 1 MG tablet      glucose 5 G chewable tablet        Allergies  Allergen Reactions  . Ciprofloxacin Itching, Swelling and Other (See Comments)    Possibly causing tremors?   Follow-up Information    Follow up with [facility].   Specialty:  Rehabilitation   Why:  they will call you at home to schedule apt time   Contact information:   [place] [phone]      Follow up with OSEI-BONSU,GEORGE, MD In 1 week.  Specialty:  Internal Medicine   Contact information:   [id number] [place] [address] [phone]        The results of significant diagnostics from this hospitalization (including imaging, microbiology, ancillary and laboratory) are listed below for reference.    Significant Diagnostic Studies: Dg Chest 2 View  01/04/2015  CLINICAL DATA:  Fever EXAM: CHEST  2 VIEW COMPARISON:  2 days ago FINDINGS: Tubing overlaps the chest. Chronic mild cardiomegaly and aortic tortuosity. Stable coarsened appearance of markings at the bases, dating back to at least 06/20/2014. No edema, effusion, or pneumothorax. Upper mediastinal widening and distortion in the setting of rotation. IMPRESSION: Mild scarring at the bases.  No definitive pneumonia. Electronically Signed   By: Monte Fantasia M.D.   On: 01/04/2015 12:16   Dg Chest 2 View  01/02/2015  CLINICAL DATA:  Fever.  Weakness.  Confusion. EXAM: CHEST  2 VIEW COMPARISON:  06/20/2014 6C FINDINGS: Mild cardiomegaly is stable. No evidence of congestive heart failure. Chronic interstitial prominence noted in  both lung bases. No evidence of acute infiltrate or edema. No evidence of pneumothorax or pleural effusion. IMPRESSION: Stable cardiomegaly and chronic bibasilar interstitial prominence. No acute findings. Electronically Signed   By: Earle Gell M.D.   On: 01/02/2015 20:37    Microbiology: Recent Results (from the past 240 hour(s))  Culture, blood (routine x 2)     Status: None (Preliminary result)   Collection Time: 01/02/15  7:55 PM  Result Value Ref Range Status   Specimen Description BLOOD RIGHT ARM  Final   Special Requests BOTTLES DRAWN AEROBIC AND ANAEROBIC 5CC  Final   Culture NO GROWTH 4 DAYS  Final   Report Status PENDING  Incomplete  Culture, blood (routine x 2)     Status: None (Preliminary result)   Collection Time: 01/02/15  7:55 PM  Result Value Ref Range Status   Specimen Description BLOOD LEFT ARM  Final   Special Requests BOTTLES DRAWN AEROBIC AND ANAEROBIC 5CC  Final   Culture NO GROWTH 4 DAYS  Final   Report Status PENDING  Incomplete  Urine culture     Status: None   Collection Time: 01/02/15  9:08 PM  Result Value Ref Range Status   Specimen Description URINE, CLEAN CATCH  Final   Special Requests NONE  Final   Culture NO GROWTH 2 DAYS  Final   Report Status 01/04/2015 FINAL  Final  Urine culture     Status: None   Collection Time: 01/03/15  1:55 AM  Result Value Ref Range Status   Specimen Description URINE, CLEAN CATCH  Final   Special Requests NONE  Final   Culture NO GROWTH 1 DAY  Final   Report Status 01/04/2015 FINAL  Final     Labs: Basic Metabolic Panel:  Recent Labs Lab 01/02/15 1955 01/03/15 0251 01/04/15 1135  NA 134* 135 134*  K 3.9 3.8 3.7  CL 99* 106 105  CO2 25 22 24   GLUCOSE 124* 132* 98  BUN 12 10 12   CREATININE 1.29* 1.12* 1.11*  CALCIUM 9.8 8.4* 8.8*   Liver Function Tests:  Recent Labs Lab 01/02/15 1955  AST 25  ALT 13*  ALKPHOS 56  BILITOT 0.4  PROT 8.4*  ALBUMIN 4.3   No results for input(s): LIPASE, AMYLASE  in the last 168 hours. No results for input(s): AMMONIA in the last 168 hours. CBC:  Recent Labs Lab 01/02/15 1955 01/03/15 0251 01/04/15 1135 01/05/15 [id number]  WBC 7.2 7.3 6.2 5.2  NEUTROABS 5.7  --   --  3.0  HGB 11.1* 10.0* 9.9* 10.4*  HCT 34.5* 31.6* 31.1* 32.5*  MCV 89.1 89.0 89.6 89.5  PLT 215 208 183 180   Cardiac Enzymes: No results for input(s): CKTOTAL, CKMB, CKMBINDEX, TROPONINI in the last 168 hours. BNP: BNP (last 3 results) No results for input(s): BNP in the last 8760 hours.  ProBNP (last 3 results) No results for input(s): PROBNP in the last 8760 hours.  CBG:  Recent Labs Lab 01/05/15 0813 01/05/15 1138 01/05/15 1648 01/05/15 2133 01/06/15 0846  GLUCAP 79 133* 121* 127* 88       Signed:  Yazan Gatling  [facility] 01/06/2015, 11:53 AM

## 2015-01-06 NOTE — Care Management (Signed)
CM spoke with the patient at the bedside about the 3N1 and shower bench request. Informed Medicare likely does not cover the shower bench. Offered the option of paying out-of-pocket for the shower bench. She would like the 3N1 ordered. Merry Proud at [facility] notified of DME order. Presenter, broadcasting BSN CCM

## 2015-01-06 NOTE — Discharge Summary (Signed)
Physician Discharge Summary  [name] [id number] DOB: Jul 09, 1940 DOA: 01/02/2015  PCP: Benito Mccreedy, MD  Admit date: 01/02/2015 Discharge date: 01/06/2015  Time spent: 35 minutes  Recommendations for Outpatient Follow-up:  1. Patient admitted for febrile illness, could be related to UTI vs drug reaction to Cipro. Discharged on Ceftin 250 mg PO BID 2. She was set up with [facility] on discharge   Discharge Diagnoses:  Principal Problem:  Drug-induced hypersensitivity reaction Active Problems:  Pyrexia  Hypothyroidism  HTN (hypertension)  UTI (urinary tract infection)  Lupus (HCC)  Fever, unspecified   Discharge Condition: Stable  Diet recommendation: Heart Healthy  Filed Weights   01/02/15 1754 01/03/15 1100  Weight: 68.04 kg (150 lb) 69.7 kg (153 lb 10.6 oz)    History of present illness:  74 year old female with a past medical history of HTN, lupus, hypothyroidism, diabetes mellitus; who presents with complaints of Itching, nausea, and vomiting. Patient went to her primary care office today for routine follow-up. Patient had PCP check her urine and she has a previous history of UTIs that have led her to have hospitalizations as bacteria can get in her blood. Patient denied any symptoms of dysuria, frequency, odor, or fever. Her doctor at that time performed a UA which appeared to be positive for bacteria. She was started on ciprofloxacin although patient did not recall at that time that she was allergic to this medicine. She took one dose and developed acute onset of itching all over, and felt as though her throat was getting tight. She reports associated symptoms of headache, nausea, vomiting, and shakes.   Upon arrival to the emergency department she was seen have elevated fever 103.87F, heart rate of 109, Lactic acid level of 2.5 on initial lab work. UA was positive for small amount of leukocyte estrace.  Hospital Course:  [name] is a  pleasant 74 y/o with a past medical history of hypertension, lupus, hypothyroidism presented to the emergency department overnight with complaints of generalized pruritus. She was diagnosed with a urinary tract infection by her primary care provider and prescribed ciprofloxacin. After taking first dose of this medication she developed generalized pruritus associated with headaches, nausea and vomiting. On presentation she was found to have a temperature of 103.2 with lactic acid of 2.5.   Drug allergy -Patient recent diagnosed with urinary tract infection by her primary care provider, prescribed pruritus which led to the development of generalized pruritus associated with nausea vomiting. -Ciprofloxacin has been discontinued.   2. Febrile Illness -Overnight patient again spiked a temperature of 103. Labs reviewed, white count remains within normal limits at 6,200. I checked a flu swab which came back negative. A repeat 2 view chest x-ray did not show evidence of pneumonia. -She complained of headache which I thought could be related to UTI or perhaps allergic reaction. She continued to have headache and on 01/04/2015 reported to me having "neck pain". -It was unclear to me if febrile illnesses related to urinary tract infection or allergic reaction and concerned other possibilities such as a viral meningitis for which Neurology was consulted for LP. -On 01/05/2015 case discussed with Neurology. She has shown significant improvement without further temp spikes and resolution of headaches. Dr Leonel Ramsay NOT recommending LP today and to continue monitoring for further fevers -On 01/06/2015 she had not spiked any fevers in the past 24 hours -Discharged on Ceftin 250 mg PO BID  2. Possible Urinary tract infection. -Patient continues to spike high temperatures of 103. -She is  ill-appearing on exam, will continue empiric IV antibiotic coverage with ceftriaxone 1 g IV every 24 hours. Urinalysis revealed  trace leukocytes with rare bacteria however this was obtained after taking antibiotic therapy. -Discharged on Ceftin 250 mg PO BID   Discharge Exam: Filed Vitals:   01/05/15 2135 01/06/15 0530  BP: 152/73 150/75  Pulse: 65 62  Temp: 98.5 F (36.9 C) 98.8 F (37.1 C)  Resp: 15 15     General: Looks better, nontoxic sitting at edge of bed.   Cardiovascular: Regular rate rhythm normal S1-S2 no murmurs or gallops  Respiratory: Normal respiratory effort, lungs are clear to auscultation bilaterally  Abdomen: Soft nontender nondistended  Musculoskeletal: No edema  Discharge Instructions   Discharge Instructions    Call MD for: difficulty breathing, headache or visual disturbances  Complete by: As directed      Call MD for: extreme fatigue  Complete by: As directed      Call MD for: hives  Complete by: As directed      Call MD for: persistant dizziness or light-headedness  Complete by: As directed      Call MD for: persistant nausea and vomiting  Complete by: As directed      Call MD for: redness, tenderness, or signs of infection (pain, swelling, redness, odor or green/yellow discharge around incision site)  Complete by: As directed      Call MD for: severe uncontrolled pain  Complete by: As directed      Call MD for: temperature >100.4  Complete by: As directed      Call MD for:  Complete by: As directed      Diet - low sodium heart healthy  Complete by: As directed      Increase activity slowly  Complete by: As directed           Current Discharge Medication List    START taking these medications   Details  cefUROXime (CEFTIN) 250 MG tablet Take 1 tablet (250 mg total) by mouth 2 (two) times daily with a meal. Qty: 8 tablet, Refills: 0      CONTINUE these medications which have NOT CHANGED   Details  levothyroxine  (SYNTHROID, LEVOTHROID) 50 MCG tablet Take 50 mcg by mouth daily before breakfast.    lisinopril-hydrochlorothiazide (PRINZIDE,ZESTORETIC) 20-12.5 MG per tablet Take 1 tablet by mouth.     omeprazole (PRILOSEC) 20 MG capsule Take 20 mg by mouth daily.    hydroxychloroquine (PLAQUENIL) 200 MG tablet Take 200 mg by mouth daily.      STOP taking these medications     glimepiride (AMARYL) 1 MG tablet      glucose 5 G chewable tablet        Allergies  Allergen Reactions  . Ciprofloxacin Itching, Swelling and Other (See Comments)    Possibly causing tremors?   Follow-up Information    Follow up with [facility].   Specialty: Rehabilitation   Why: they will call you at home to schedule apt time   Contact information:   [place] [phone]      Follow up with OSEI-BONSU,GEORGE, MD In 1 week.   Specialty: Internal Medicine   Contact information:   [id number] [place] [address] [phone]         The results of significant diagnostics from this hospitalization (including imaging, microbiology, ancillary and laboratory) are listed below for reference.    Significant Diagnostic  Studies:  Imaging Results    Dg Chest 2 View  01/04/2015 CLINICAL DATA: Fever EXAM: CHEST 2 VIEW COMPARISON: 2 days ago FINDINGS: Tubing overlaps the chest. Chronic mild cardiomegaly and aortic tortuosity. Stable coarsened appearance of markings at the bases, dating back to at least 06/20/2014. No edema, effusion, or pneumothorax. Upper mediastinal widening and distortion in the setting of rotation. IMPRESSION: Mild scarring at the bases. No definitive pneumonia. Electronically Signed By: Monte Fantasia M.D. On: 01/04/2015 12:16   Dg Chest 2 View  01/02/2015 CLINICAL DATA: Fever. Weakness. Confusion.  EXAM: CHEST 2 VIEW COMPARISON: 06/20/2014 6C FINDINGS: Mild cardiomegaly is stable. No evidence of congestive heart failure. Chronic interstitial prominence noted in both lung bases. No evidence of acute infiltrate or edema. No evidence of pneumothorax or pleural effusion. IMPRESSION: Stable cardiomegaly and chronic bibasilar interstitial prominence. No acute findings. Electronically Signed By: Earle Gell M.D. On: 01/02/2015 20:37     Microbiology: Recent Results (from the past 240 hour(s))  Culture, blood (routine x 2) Status: None (Preliminary result)   Collection Time: 01/02/15 7:55 PM  Result Value Ref Range Status   Specimen Description BLOOD RIGHT ARM  Final   Special Requests BOTTLES DRAWN AEROBIC AND ANAEROBIC 5CC  Final   Culture NO GROWTH 4 DAYS  Final   Report Status PENDING  Incomplete  Culture, blood (routine x 2) Status: None (Preliminary result)   Collection Time: 01/02/15 7:55 PM  Result Value Ref Range Status   Specimen Description BLOOD LEFT ARM  Final   Special Requests BOTTLES DRAWN AEROBIC AND ANAEROBIC 5CC  Final   Culture NO GROWTH 4 DAYS  Final   Report Status PENDING  Incomplete  Urine culture Status: None   Collection Time: 01/02/15 9:08 PM  Result Value Ref Range Status   Specimen Description URINE, CLEAN CATCH  Final   Special Requests NONE  Final   Culture NO GROWTH 2 DAYS  Final   Report Status 01/04/2015 FINAL  Final  Urine culture Status: None   Collection Time: 01/03/15 1:55 AM  Result Value Ref Range Status   Specimen Description URINE, CLEAN CATCH  Final   Special Requests NONE  Final   Culture NO GROWTH 1 DAY  Final   Report Status 01/04/2015 FINAL  Final     Labs: Basic Metabolic Panel:  Last Labs      Recent Labs Lab 01/02/15 1955 01/03/15 0251 01/04/15 1135  NA 134* 135 134*  K 3.9 3.8  3.7  CL 99* 106 105  CO2 25 22 24   GLUCOSE 124* 132* 98  BUN 12 10 12   CREATININE 1.29* 1.12* 1.11*  CALCIUM 9.8 8.4* 8.8*     Liver Function Tests:  Last Labs      Recent Labs Lab 01/02/15 1955  AST 25  ALT 13*  ALKPHOS 56  BILITOT 0.4  PROT 8.4*  ALBUMIN 4.3      Last Labs     No results for input(s): LIPASE, AMYLASE in the last 168 hours.    Last Labs     No results for input(s): AMMONIA in the last 168 hours.   CBC:  Last Labs      Recent Labs Lab 01/02/15 1955 01/03/15 0251 01/04/15 1135 01/05/15 0931  WBC 7.2 7.3 6.2 5.2  NEUTROABS 5.7 --  --  3.0  HGB 11.1* 10.0* 9.9* 10.4*  HCT 34.5* 31.6* 31.1* 32.5*  MCV 89.1 89.0 89.6 89.5  PLT 215 208 183 180     Cardiac  Enzymes:  Last Labs     No results for input(s): CKTOTAL, CKMB, CKMBINDEX, TROPONINI in the last 168 hours.   BNP: BNP (last 3 results)  Recent Labs (within last 365 days)    No results for input(s): BNP in the last 8760 hours.    ProBNP (last 3 results)  Recent Labs (within last 365 days)    No results for input(s): PROBNP in the last 8760 hours.    CBG:  Last Labs      Recent Labs Lab 01/05/15 0813 01/05/15 1138 01/05/15 1648 01/05/15 2133 01/06/15 0846  GLUCAP 79 133* 121* 127* 88         Signed:  Vegas Coffin [facility] 01/06/2015, 11:53 AM

## 2015-01-06 NOTE — Care Management Note (Signed)
Case Management Note  Patient Details  Name: Jemmah Vanwyhe MRN: [id number] Date of Birth: 24-Jul-1940  Subjective/Objective:                  UTI  Action/Plan: CM spoke with patient's daughter. Selected [facility] for home health PT. Tiffany at [facility] notified.   Expected Discharge Date:     01/06/15            Expected Discharge Plan:  Home/Self Care  In-House Referral:     Discharge planning Services  CM Consult  Post Acute Care Choice:    Choice offered to:  Adult Children, Patient  DME Arranged:  3-N-1 DME Agency:  [place]:  PT [place] Agency:  [place]  Status of Service:  Completed, signed off  Medicare Important Message Given:  Yes Date Medicare IM Given:    Medicare IM give by:    Date Additional Medicare IM Given:    Additional Medicare Important Message give by:     If discussed at [place] of Stay Meetings, dates discussed:    Additional Comments:  Apolonio Schneiders, RN 01/06/2015, 3:18 PM

## 2015-01-07 LAB — CULTURE, BLOOD (ROUTINE X 2)
Culture: NO GROWTH
Culture: NO GROWTH

## 2015-01-12 ENCOUNTER — Emergency Department (HOSPITAL_COMMUNITY)
Admission: EM | Admit: 2015-01-12 | Discharge: 2015-01-12 | Disposition: A | Discharge status: Home / Self Care | Payer: Medicare Other | Attending: Emergency Medicine | Admitting: Emergency Medicine

## 2015-01-12 ENCOUNTER — Encounter (HOSPITAL_COMMUNITY): Payer: Self-pay | Admitting: *Deleted

## 2015-01-12 ENCOUNTER — Emergency Department (HOSPITAL_COMMUNITY): Payer: Medicare Other

## 2015-01-12 DIAGNOSIS — Z79899 Other long term (current) drug therapy: Secondary | ICD-10-CM | POA: Diagnosis not present

## 2015-01-12 DIAGNOSIS — I10 Essential (primary) hypertension: Secondary | ICD-10-CM | POA: Diagnosis not present

## 2015-01-12 DIAGNOSIS — Z7984 Long term (current) use of oral hypoglycemic drugs: Secondary | ICD-10-CM | POA: Diagnosis not present

## 2015-01-12 DIAGNOSIS — R1110 Vomiting, unspecified: Secondary | ICD-10-CM | POA: Diagnosis present

## 2015-01-12 DIAGNOSIS — R531 Weakness: Secondary | ICD-10-CM | POA: Insufficient documentation

## 2015-01-12 DIAGNOSIS — R1111 Vomiting without nausea: Secondary | ICD-10-CM | POA: Insufficient documentation

## 2015-01-12 DIAGNOSIS — M329 Systemic lupus erythematosus, unspecified: Secondary | ICD-10-CM | POA: Diagnosis not present

## 2015-01-12 DIAGNOSIS — E039 Hypothyroidism, unspecified: Secondary | ICD-10-CM | POA: Insufficient documentation

## 2015-01-12 DIAGNOSIS — Z792 Long term (current) use of antibiotics: Secondary | ICD-10-CM | POA: Insufficient documentation

## 2015-01-12 DIAGNOSIS — E119 Type 2 diabetes mellitus without complications: Secondary | ICD-10-CM | POA: Insufficient documentation

## 2015-01-12 LAB — HEPATIC FUNCTION PANEL
ALT: 11 U/L — ABNORMAL LOW (ref 14–54)
AST: 22 U/L (ref 15–41)
Albumin: 4.5 g/dL (ref 3.5–5.0)
Alkaline Phosphatase: 48 U/L (ref 38–126)
BILIRUBIN DIRECT: 0.1 mg/dL (ref 0.1–0.5)
BILIRUBIN INDIRECT: 0.6 mg/dL (ref 0.3–0.9)
TOTAL PROTEIN: 8.9 g/dL — AB (ref 6.5–8.1)
Total Bilirubin: 0.7 mg/dL (ref 0.3–1.2)

## 2015-01-12 LAB — CBC WITH DIFFERENTIAL/PLATELET
Basophils Absolute: 0 10*3/uL (ref 0.0–0.1)
Basophils Relative: 0 %
Eosinophils Absolute: 0 10*3/uL (ref 0.0–0.7)
Eosinophils Relative: 1 %
HEMATOCRIT: 36.9 % (ref 36.0–46.0)
HEMOGLOBIN: 11.9 g/dL — AB (ref 12.0–15.0)
LYMPHS ABS: 1.8 10*3/uL (ref 0.7–4.0)
Lymphocytes Relative: 34 %
MCH: 28.7 pg (ref 26.0–34.0)
MCHC: 32.2 g/dL (ref 30.0–36.0)
MCV: 89.1 fL (ref 78.0–100.0)
MONOS PCT: 9 %
Monocytes Absolute: 0.5 10*3/uL (ref 0.1–1.0)
NEUTROS ABS: 3 10*3/uL (ref 1.7–7.7)
NEUTROS PCT: 56 %
Platelets: 304 10*3/uL (ref 150–400)
RBC: 4.14 MIL/uL (ref 3.87–5.11)
RDW: 13.6 % (ref 11.5–15.5)
WBC: 5.3 10*3/uL (ref 4.0–10.5)

## 2015-01-12 LAB — URINALYSIS, ROUTINE W REFLEX MICROSCOPIC
Bilirubin Urine: NEGATIVE
Glucose, UA: NEGATIVE mg/dL
Hgb urine dipstick: NEGATIVE
KETONES UR: NEGATIVE mg/dL
LEUKOCYTES UA: NEGATIVE
Nitrite: NEGATIVE
PROTEIN: NEGATIVE mg/dL
Specific Gravity, Urine: 1.017 (ref 1.005–1.030)
pH: 6 (ref 5.0–8.0)

## 2015-01-12 LAB — I-STAT TROPONIN, ED: Troponin i, poc: 0.01 ng/mL (ref 0.00–0.08)

## 2015-01-12 LAB — BASIC METABOLIC PANEL
Anion gap: 8 (ref 5–15)
BUN: 18 mg/dL (ref 6–20)
CALCIUM: 10.1 mg/dL (ref 8.9–10.3)
CO2: 26 mmol/L (ref 22–32)
CREATININE: 1.4 mg/dL — AB (ref 0.44–1.00)
Chloride: 99 mmol/L — ABNORMAL LOW (ref 101–111)
GFR calc Af Amer: 42 mL/min — ABNORMAL LOW (ref >60)
GFR, EST NON AFRICAN AMERICAN: 37 mL/min — AB (ref >60)
Glucose, Bld: 106 mg/dL — ABNORMAL HIGH (ref 65–99)
Potassium: 3.7 mmol/L (ref 3.5–5.1)
Sodium: 133 mmol/L — ABNORMAL LOW (ref 135–145)

## 2015-01-12 LAB — LIPASE, BLOOD: LIPASE: 42 U/L (ref 11–51)

## 2015-01-12 LAB — CBG MONITORING, ED: GLUCOSE-CAPILLARY: 98 mg/dL (ref 65–99)

## 2015-01-12 LAB — I-STAT CG4 LACTIC ACID, ED: LACTIC ACID, VENOUS: 0.99 mmol/L (ref 0.5–2.0)

## 2015-01-12 NOTE — ED Notes (Signed)
Pt verbalized understanding of d/c instructions and has no further questions. Pt stable and NAD.  

## 2015-01-12 NOTE — ED Provider Notes (Signed)
CSN: [id number]     Arrival date & time 01/12/15  1552 History   First MD Initiated Contact with Patient 01/12/15 1814     Chief Complaint  Patient presents with  . Emesis     (Consider location/radiation/quality/duration/timing/severity/associated sxs/prior Treatment) HPI Comments: 74 y.o. Female with history of HTN, DM, Lupus presents for vomiting.  The patient was recently admitted to the hospital for sepsis.  Since discharge the patient has felt generally weak and has not had much of an appetite but she denies fever, chills, abdominal pain.  Today she went to a Christmas party and after eating the food there she returned home and vomited.  She says that was the most that she had eaten in a long time.  She denies abdominal pain.  She says that she has not gotten her strength back from her admission but right now feels otherwise fine.     Past Medical History  Diagnosis Date  . Hypertension   . Diabetes mellitus without complication ([facility])   . Lupus ([place])   . Hypothyroidism    History reviewed. No pertinent past surgical history. No family history on file. Social History  Substance Use Topics  . Smoking status: Never Smoker   . Smokeless tobacco: Current User    Types: Snuff  . Alcohol Use: No   OB History    No data available     Review of Systems  Constitutional: Negative for fever, chills, activity change and fatigue.  HENT: Negative for congestion, postnasal drip and rhinorrhea.   Eyes: Negative for pain and visual disturbance.  Respiratory: Negative for cough, chest tightness and shortness of breath.   Cardiovascular: Negative for chest pain, palpitations and leg swelling.  Gastrointestinal: Positive for vomiting. Negative for nausea, abdominal pain and diarrhea.  Genitourinary: Negative for dysuria, urgency, frequency and hematuria.  Musculoskeletal: Negative for myalgias and back pain.  Skin: Negative for rash.  Neurological: Positive for weakness (generalized).  Negative for dizziness, syncope and headaches.  Hematological: Does not bruise/bleed easily.      Allergies  Ciprofloxacin  Home Medications   Prior to Admission medications   Medication Sig Start Date End Date Taking? Authorizing Provider  cefUROXime (CEFTIN) 250 MG tablet Take 1 tablet (250 mg total) by mouth 2 (two) times daily with a meal. 01/06/15  Yes Kelvin Cellar, MD  glimepiride (AMARYL) 1 MG tablet Take 1 mg by mouth daily. 01/02/15  Yes Historical Provider, MD  hydroxychloroquine (PLAQUENIL) 200 MG tablet Take 1 tablet (200 mg total) by mouth daily. 01/06/15  Yes Kelvin Cellar, MD  levothyroxine (SYNTHROID, LEVOTHROID) 50 MCG tablet Take 50 mcg by mouth daily before breakfast.   Yes Historical Provider, MD  lisinopril-hydrochlorothiazide (PRINZIDE,ZESTORETIC) 20-12.5 MG per tablet Take 1 tablet by mouth.    Yes Historical Provider, MD  Multiple Vitamin (MULTIVITAMIN WITH MINERALS) TABS tablet Take 1 tablet by mouth daily.   Yes Historical Provider, MD  omeprazole (PRILOSEC) 20 MG capsule Take 20 mg by mouth daily.   Yes Historical Provider, MD   BP 124/78 mmHg  Pulse 68  Temp(Src) 98 F (36.7 C) (Oral)  Resp 16  Wt 144 lb 9 oz (65.573 kg)  SpO2 99% Physical Exam  Constitutional: [name], place, and time. She appears well-developed and well-nourished. No distress.  HENT:  Head: Normocephalic and atraumatic.  Right Ear: External ear normal.  Left Ear: External ear normal.  Nose: Nose normal.  Mouth/Throat: Oropharynx is clear and moist. No oropharyngeal exudate.  Eyes: EOM are normal. Pupils are equal, round, and reactive to light.  Neck: Normal range of motion. Neck supple.  Cardiovascular: Normal rate, regular rhythm, normal heart sounds and intact distal pulses.   No murmur heard. Pulmonary/Chest: Effort normal. No respiratory distress. She has no wheezes. She has no rales.  Abdominal: Soft. She exhibits no distension. There is no tenderness.   Musculoskeletal: Normal range of motion. She exhibits no edema or tenderness.  Neurological: She is alert and oriented to person, place, and time.  Skin: Skin is warm and dry. No rash noted. She is not diaphoretic.  Vitals reviewed.   ED Course  Procedures (including critical care time) Labs Review Labs Reviewed  CBC WITH DIFFERENTIAL/PLATELET - Abnormal; Notable for the following:    Hemoglobin 11.9 (*)    All other components within normal limits  BASIC METABOLIC PANEL - Abnormal; Notable for the following:    Sodium 133 (*)    Chloride 99 (*)    Glucose, Bld 106 (*)    Creatinine, Ser 1.40 (*)    GFR calc non Af Amer 37 (*)    GFR calc Af Amer 42 (*)    All other components within normal limits  HEPATIC FUNCTION PANEL - Abnormal; Notable for the following:    Total Protein 8.9 (*)    ALT 11 (*)    All other components within normal limits  URINALYSIS, ROUTINE W REFLEX MICROSCOPIC (NOT AT [facility]) - Abnormal; Notable for the following:    APPearance CLOUDY (*)    All other components within normal limits  LIPASE, BLOOD  CBG MONITORING, ED  I-STAT CG4 LACTIC ACID, ED  Randolm Idol, ED    Imaging Review Dg Abd Acute W/chest  01/12/2015  CLINICAL DATA:  Nausea and vomiting. EXAM: DG ABDOMEN ACUTE W/ 1V CHEST COMPARISON:  01/04/2015 FINDINGS: There is no evidence of dilated bowel loops or free intraperitoneal air. Left renal calculi identified. The largest measures 5 mm. Heart size and mediastinal contours are within normal limits. Chronic interstitial coarsening is identified within both lung bases. Left renal calculi IMPRESSION: 1. No acute cardiopulmonary abnormalities. 2. Nonobstructive bowel gas pattern. 3. Left renal calculi. Electronically Signed   By: Kerby Moors M.D.   On: 01/12/2015 19:12   I have personally reviewed and evaluated these images and lab results as part of my medical decision-making.   EKG Interpretation   Date/Time:  Saturday January 12 2015  19:23:46 EST Ventricular Rate:  56 PR Interval:  193 QRS Duration: 95 QT Interval:  376 QTC Calculation: 363 R Axis:     Text Interpretation:  Sinus rhythm Borderline low voltage, extremity leads  LOW VOLTAGE Rate slowed compared to previous Confirmed by Trelon Plush  [id number]) on 01/12/2015 9:01:52 PM      MDM  Patient was seen and evaluated in stable condition.  Labs, EKG, AAS unremarkable.  Patient tolerated PO in the ED.  Patient reported feeling well.  All results were discussed with patient and family at bedside all of who expressed understanding.  Patient was discharged home in stable condition with strict return precautions and instruction to follow up with her PCP. Final diagnoses:  Vomiting without nausea, vomiting of unspecified type    1. Vomiting    Harvel Quale, MD 01/13/15 [phone]

## 2015-01-12 NOTE — Discharge Instructions (Signed)
The exact cause of your vomiting today is not entirely clear.  Eat small frequent meals.  Follow up with your primary care physician and a stomach doctor outpatient.  Return with worsening symptoms, fever, chills, nausea, vomiting, severe abdominal pain.  Nausea and Vomiting Nausea is a sick feeling that often comes before throwing up (vomiting). Vomiting is a reflex where stomach contents come out of your mouth. Vomiting can cause severe loss of body fluids (dehydration). Children and elderly adults can become dehydrated quickly, especially if they also have diarrhea. Nausea and vomiting are symptoms of a condition or disease. It is important to find the cause of your symptoms. CAUSES   Direct irritation of the stomach lining. This irritation can result from increased acid production (gastroesophageal reflux disease), infection, food poisoning, taking certain medicines (such as nonsteroidal anti-inflammatory drugs), alcohol use, or tobacco use.  Signals from the brain.These signals could be caused by a headache, heat exposure, an inner ear disturbance, increased pressure in the brain from injury, infection, a tumor, or a concussion, pain, emotional stimulus, or metabolic problems.  An obstruction in the gastrointestinal tract (bowel obstruction).  Illnesses such as diabetes, hepatitis, gallbladder problems, appendicitis, kidney problems, cancer, sepsis, atypical symptoms of a heart attack, or eating disorders.  Medical treatments such as chemotherapy and radiation.  Receiving medicine that makes you sleep (general anesthetic) during surgery. DIAGNOSIS Your caregiver may ask for tests to be done if the problems do not improve after a few days. Tests may also be done if symptoms are severe or if the reason for the nausea and vomiting is not clear. Tests may include:  Urine tests.  Blood tests.  Stool tests.  Cultures (to look for evidence of infection).  X-rays or other imaging  studies. Test results can help your caregiver make decisions about treatment or the need for additional tests. TREATMENT You need to stay well hydrated. Drink frequently but in small amounts.You may wish to drink water, sports drinks, clear broth, or eat frozen ice pops or gelatin dessert to help stay hydrated.When you eat, eating slowly may help prevent nausea.There are also some antinausea medicines that may help prevent nausea. HOME CARE INSTRUCTIONS   Take all medicine as directed by your caregiver.  If you do not have an appetite, do not force yourself to eat. However, you must continue to drink fluids.  If you have an appetite, eat a normal diet unless your caregiver tells you differently.  Eat a variety of complex carbohydrates (rice, wheat, potatoes, bread), lean meats, yogurt, fruits, and vegetables.  Avoid high-fat foods because they are more difficult to digest.  Drink enough water and fluids to keep your urine clear or pale yellow.  If you are dehydrated, ask your caregiver for specific rehydration instructions. Signs of dehydration may include:  Severe thirst.  Dry lips and mouth.  Dizziness.  Dark urine.  Decreasing urine frequency and amount.  Confusion.  Rapid breathing or pulse. SEEK IMMEDIATE MEDICAL CARE IF:   You have blood or brown flecks (like coffee grounds) in your vomit.  You have black or bloody stools.  You have a severe headache or stiff neck.  You are confused.  You have severe abdominal pain.  You have chest pain or trouble breathing.  You do not urinate at least once every 8 hours.  You develop cold or clammy skin.  You continue to vomit for longer than 24 to 48 hours.  You have a fever. MAKE SURE YOU:   Understand  these instructions.  Will watch your condition.  Will get help right away if you are not doing well or get worse.   This information is not intended to replace advice given to you by your health care provider.  Make sure you discuss any questions you have with your health care provider.   Document Released: 01/12/2005 Document Revised: 04/06/2011 Document Reviewed: 06/11/2010 Elsevier Interactive Patient Education [employer].

## 2015-01-12 NOTE — ED Notes (Signed)
The pt is c/o nausea vomiting for one week she was just discharged from the hospital last Sunday.  She has no pain  At present she is not nauseated

## 2015-05-02 DIAGNOSIS — I10 Essential (primary) hypertension: Secondary | ICD-10-CM | POA: Diagnosis not present

## 2015-05-02 DIAGNOSIS — E119 Type 2 diabetes mellitus without complications: Secondary | ICD-10-CM | POA: Diagnosis not present

## 2015-05-20 DIAGNOSIS — D126 Benign neoplasm of colon, unspecified: Secondary | ICD-10-CM | POA: Diagnosis not present

## 2015-05-20 DIAGNOSIS — Z1211 Encounter for screening for malignant neoplasm of colon: Secondary | ICD-10-CM | POA: Diagnosis not present

## 2015-05-20 DIAGNOSIS — K5730 Diverticulosis of large intestine without perforation or abscess without bleeding: Secondary | ICD-10-CM | POA: Diagnosis not present

## 2015-05-20 DIAGNOSIS — D120 Benign neoplasm of cecum: Secondary | ICD-10-CM | POA: Diagnosis not present

## 2015-05-22 DIAGNOSIS — E119 Type 2 diabetes mellitus without complications: Secondary | ICD-10-CM | POA: Diagnosis not present

## 2015-05-22 DIAGNOSIS — H5210 Myopia, unspecified eye: Secondary | ICD-10-CM | POA: Diagnosis not present

## 2015-05-22 DIAGNOSIS — H5200 Hypermetropia, unspecified eye: Secondary | ICD-10-CM | POA: Diagnosis not present

## 2015-05-24 DIAGNOSIS — E039 Hypothyroidism, unspecified: Secondary | ICD-10-CM | POA: Diagnosis not present

## 2015-05-24 DIAGNOSIS — I10 Essential (primary) hypertension: Secondary | ICD-10-CM | POA: Diagnosis not present

## 2015-05-24 DIAGNOSIS — E119 Type 2 diabetes mellitus without complications: Secondary | ICD-10-CM | POA: Diagnosis not present

## 2015-05-24 DIAGNOSIS — R6889 Other general symptoms and signs: Secondary | ICD-10-CM | POA: Diagnosis not present

## 2015-06-05 DIAGNOSIS — H905 Unspecified sensorineural hearing loss: Secondary | ICD-10-CM | POA: Diagnosis not present

## 2015-06-06 DIAGNOSIS — E119 Type 2 diabetes mellitus without complications: Secondary | ICD-10-CM | POA: Diagnosis not present

## 2015-06-06 DIAGNOSIS — I10 Essential (primary) hypertension: Secondary | ICD-10-CM | POA: Diagnosis not present

## 2015-06-19 DIAGNOSIS — J309 Allergic rhinitis, unspecified: Secondary | ICD-10-CM | POA: Diagnosis not present

## 2015-06-19 DIAGNOSIS — J209 Acute bronchitis, unspecified: Secondary | ICD-10-CM | POA: Diagnosis not present

## 2015-06-25 DIAGNOSIS — E119 Type 2 diabetes mellitus without complications: Secondary | ICD-10-CM | POA: Diagnosis not present

## 2015-06-25 DIAGNOSIS — Z01118 Encounter for examination of ears and hearing with other abnormal findings: Secondary | ICD-10-CM | POA: Diagnosis not present

## 2015-06-25 DIAGNOSIS — R6889 Other general symptoms and signs: Secondary | ICD-10-CM | POA: Diagnosis not present

## 2015-06-25 DIAGNOSIS — Z0000 Encounter for general adult medical examination without abnormal findings: Secondary | ICD-10-CM | POA: Diagnosis not present

## 2015-06-25 DIAGNOSIS — Z136 Encounter for screening for cardiovascular disorders: Secondary | ICD-10-CM | POA: Diagnosis not present

## 2015-06-25 DIAGNOSIS — I10 Essential (primary) hypertension: Secondary | ICD-10-CM | POA: Diagnosis not present

## 2015-06-25 DIAGNOSIS — E039 Hypothyroidism, unspecified: Secondary | ICD-10-CM | POA: Diagnosis not present

## 2015-06-25 DIAGNOSIS — H538 Other visual disturbances: Secondary | ICD-10-CM | POA: Diagnosis not present

## 2015-07-04 DIAGNOSIS — I10 Essential (primary) hypertension: Secondary | ICD-10-CM | POA: Diagnosis not present

## 2015-07-04 DIAGNOSIS — E119 Type 2 diabetes mellitus without complications: Secondary | ICD-10-CM | POA: Diagnosis not present

## 2015-07-17 ENCOUNTER — Other Ambulatory Visit: Payer: Self-pay | Admitting: Internal Medicine

## 2015-07-17 DIAGNOSIS — Z0000 Encounter for general adult medical examination without abnormal findings: Secondary | ICD-10-CM

## 2015-08-01 ENCOUNTER — Other Ambulatory Visit: Payer: Self-pay | Admitting: Internal Medicine

## 2015-08-01 DIAGNOSIS — E039 Hypothyroidism, unspecified: Secondary | ICD-10-CM | POA: Diagnosis not present

## 2015-08-01 DIAGNOSIS — E119 Type 2 diabetes mellitus without complications: Secondary | ICD-10-CM | POA: Diagnosis not present

## 2015-08-01 DIAGNOSIS — I10 Essential (primary) hypertension: Secondary | ICD-10-CM | POA: Diagnosis not present

## 2015-08-01 DIAGNOSIS — E2839 Other primary ovarian failure: Secondary | ICD-10-CM

## 2015-08-01 DIAGNOSIS — S93699A Other sprain of unspecified foot, initial encounter: Secondary | ICD-10-CM | POA: Diagnosis not present

## 2015-08-01 DIAGNOSIS — Z1231 Encounter for screening mammogram for malignant neoplasm of breast: Secondary | ICD-10-CM

## 2015-08-06 DIAGNOSIS — E039 Hypothyroidism, unspecified: Secondary | ICD-10-CM | POA: Diagnosis not present

## 2015-08-06 DIAGNOSIS — M545 Low back pain: Secondary | ICD-10-CM | POA: Diagnosis not present

## 2015-08-06 DIAGNOSIS — H9190 Unspecified hearing loss, unspecified ear: Secondary | ICD-10-CM | POA: Diagnosis not present

## 2015-08-06 DIAGNOSIS — I10 Essential (primary) hypertension: Secondary | ICD-10-CM | POA: Diagnosis not present

## 2015-08-06 DIAGNOSIS — Z7984 Long term (current) use of oral hypoglycemic drugs: Secondary | ICD-10-CM | POA: Diagnosis not present

## 2015-08-06 DIAGNOSIS — Z6824 Body mass index (BMI) 24.0-24.9, adult: Secondary | ICD-10-CM | POA: Diagnosis not present

## 2015-08-06 DIAGNOSIS — E1136 Type 2 diabetes mellitus with diabetic cataract: Secondary | ICD-10-CM | POA: Diagnosis not present

## 2015-08-06 DIAGNOSIS — M1990 Unspecified osteoarthritis, unspecified site: Secondary | ICD-10-CM | POA: Diagnosis not present

## 2015-08-06 DIAGNOSIS — M329 Systemic lupus erythematosus, unspecified: Secondary | ICD-10-CM | POA: Diagnosis not present

## 2015-08-19 DIAGNOSIS — M25562 Pain in left knee: Secondary | ICD-10-CM | POA: Diagnosis not present

## 2015-08-19 DIAGNOSIS — M3219 Other organ or system involvement in systemic lupus erythematosus: Secondary | ICD-10-CM | POA: Diagnosis not present

## 2015-08-19 DIAGNOSIS — R6889 Other general symptoms and signs: Secondary | ICD-10-CM | POA: Diagnosis not present

## 2015-08-19 DIAGNOSIS — N183 Chronic kidney disease, stage 3 (moderate): Secondary | ICD-10-CM | POA: Diagnosis not present

## 2015-08-21 DIAGNOSIS — E039 Hypothyroidism, unspecified: Secondary | ICD-10-CM | POA: Diagnosis not present

## 2015-08-21 DIAGNOSIS — G629 Polyneuropathy, unspecified: Secondary | ICD-10-CM | POA: Diagnosis not present

## 2015-08-21 DIAGNOSIS — I10 Essential (primary) hypertension: Secondary | ICD-10-CM | POA: Diagnosis not present

## 2015-08-21 DIAGNOSIS — R1030 Lower abdominal pain, unspecified: Secondary | ICD-10-CM | POA: Diagnosis not present

## 2015-08-21 DIAGNOSIS — N390 Urinary tract infection, site not specified: Secondary | ICD-10-CM | POA: Diagnosis not present

## 2015-08-21 DIAGNOSIS — E119 Type 2 diabetes mellitus without complications: Secondary | ICD-10-CM | POA: Diagnosis not present

## 2015-08-23 ENCOUNTER — Ambulatory Visit
Admission: RE | Admit: 2015-08-23 | Discharge: 2015-08-23 | Disposition: A | Discharge status: Home / Self Care | Payer: Commercial Managed Care - HMO | Source: Ambulatory Visit | Attending: Internal Medicine | Admitting: Internal Medicine

## 2015-08-23 DIAGNOSIS — G629 Polyneuropathy, unspecified: Secondary | ICD-10-CM | POA: Diagnosis not present

## 2015-08-23 DIAGNOSIS — E039 Hypothyroidism, unspecified: Secondary | ICD-10-CM | POA: Diagnosis not present

## 2015-08-23 DIAGNOSIS — I10 Essential (primary) hypertension: Secondary | ICD-10-CM | POA: Diagnosis not present

## 2015-08-23 DIAGNOSIS — Z1231 Encounter for screening mammogram for malignant neoplasm of breast: Secondary | ICD-10-CM

## 2015-08-23 DIAGNOSIS — E119 Type 2 diabetes mellitus without complications: Secondary | ICD-10-CM | POA: Diagnosis not present

## 2015-08-23 DIAGNOSIS — R1030 Lower abdominal pain, unspecified: Secondary | ICD-10-CM | POA: Diagnosis not present

## 2015-08-23 DIAGNOSIS — R6889 Other general symptoms and signs: Secondary | ICD-10-CM | POA: Diagnosis not present

## 2015-08-23 DIAGNOSIS — N390 Urinary tract infection, site not specified: Secondary | ICD-10-CM | POA: Diagnosis not present

## 2015-08-24 ENCOUNTER — Emergency Department (HOSPITAL_COMMUNITY)
Admission: EM | Admit: 2015-08-24 | Discharge: 2015-08-24 | Disposition: A | Discharge status: Home / Self Care | Payer: Commercial Managed Care - HMO | Attending: Dermatology | Admitting: Dermatology

## 2015-08-24 ENCOUNTER — Encounter (HOSPITAL_COMMUNITY): Payer: Self-pay | Admitting: *Deleted

## 2015-08-24 DIAGNOSIS — I10 Essential (primary) hypertension: Secondary | ICD-10-CM | POA: Diagnosis not present

## 2015-08-24 DIAGNOSIS — E039 Hypothyroidism, unspecified: Secondary | ICD-10-CM | POA: Diagnosis not present

## 2015-08-24 DIAGNOSIS — Z7984 Long term (current) use of oral hypoglycemic drugs: Secondary | ICD-10-CM | POA: Diagnosis not present

## 2015-08-24 DIAGNOSIS — Z79899 Other long term (current) drug therapy: Secondary | ICD-10-CM | POA: Insufficient documentation

## 2015-08-24 DIAGNOSIS — E119 Type 2 diabetes mellitus without complications: Secondary | ICD-10-CM | POA: Insufficient documentation

## 2015-08-24 DIAGNOSIS — R109 Unspecified abdominal pain: Secondary | ICD-10-CM | POA: Insufficient documentation

## 2015-08-24 DIAGNOSIS — Z5321 Procedure and treatment not carried out due to patient leaving prior to being seen by health care provider: Secondary | ICD-10-CM | POA: Diagnosis not present

## 2015-08-24 LAB — URINALYSIS, ROUTINE W REFLEX MICROSCOPIC
Bilirubin Urine: NEGATIVE
Glucose, UA: NEGATIVE mg/dL
HGB URINE DIPSTICK: NEGATIVE
KETONES UR: NEGATIVE mg/dL
Nitrite: NEGATIVE
PROTEIN: NEGATIVE mg/dL
Specific Gravity, Urine: 1.011 (ref 1.005–1.030)
pH: 6 (ref 5.0–8.0)

## 2015-08-24 LAB — URINE MICROSCOPIC-ADD ON

## 2015-08-24 NOTE — ED Triage Notes (Signed)
Pt complains of left flank pain for the past 3 days. Pt states she feels like she pulled something in her left left. Pain is worse with movement.

## 2015-08-24 NOTE — ED Notes (Signed)
1x call no answer.  

## 2015-08-28 DIAGNOSIS — R6889 Other general symptoms and signs: Secondary | ICD-10-CM | POA: Diagnosis not present

## 2015-08-28 DIAGNOSIS — R140 Abdominal distension (gaseous): Secondary | ICD-10-CM | POA: Diagnosis not present

## 2015-08-28 DIAGNOSIS — K219 Gastro-esophageal reflux disease without esophagitis: Secondary | ICD-10-CM | POA: Diagnosis not present

## 2015-09-10 DIAGNOSIS — K222 Esophageal obstruction: Secondary | ICD-10-CM | POA: Diagnosis not present

## 2015-09-10 DIAGNOSIS — K2950 Unspecified chronic gastritis without bleeding: Secondary | ICD-10-CM | POA: Diagnosis not present

## 2015-09-10 DIAGNOSIS — K30 Functional dyspepsia: Secondary | ICD-10-CM | POA: Diagnosis not present

## 2015-09-10 DIAGNOSIS — K2970 Gastritis, unspecified, without bleeding: Secondary | ICD-10-CM | POA: Diagnosis not present

## 2015-09-10 DIAGNOSIS — R6889 Other general symptoms and signs: Secondary | ICD-10-CM | POA: Diagnosis not present

## 2015-09-10 DIAGNOSIS — K449 Diaphragmatic hernia without obstruction or gangrene: Secondary | ICD-10-CM | POA: Diagnosis not present

## 2015-09-10 DIAGNOSIS — K219 Gastro-esophageal reflux disease without esophagitis: Secondary | ICD-10-CM | POA: Diagnosis not present

## 2015-09-23 DIAGNOSIS — R6889 Other general symptoms and signs: Secondary | ICD-10-CM | POA: Diagnosis not present

## 2015-10-23 DIAGNOSIS — R6889 Other general symptoms and signs: Secondary | ICD-10-CM | POA: Diagnosis not present

## 2015-10-23 DIAGNOSIS — R140 Abdominal distension (gaseous): Secondary | ICD-10-CM | POA: Diagnosis not present

## 2015-10-23 DIAGNOSIS — K219 Gastro-esophageal reflux disease without esophagitis: Secondary | ICD-10-CM | POA: Diagnosis not present

## 2015-11-11 DIAGNOSIS — I10 Essential (primary) hypertension: Secondary | ICD-10-CM | POA: Diagnosis not present

## 2015-11-11 DIAGNOSIS — M79671 Pain in right foot: Secondary | ICD-10-CM | POA: Diagnosis not present

## 2015-11-11 DIAGNOSIS — E039 Hypothyroidism, unspecified: Secondary | ICD-10-CM | POA: Diagnosis not present

## 2015-11-11 DIAGNOSIS — R6889 Other general symptoms and signs: Secondary | ICD-10-CM | POA: Diagnosis not present

## 2015-11-11 DIAGNOSIS — M79672 Pain in left foot: Secondary | ICD-10-CM | POA: Diagnosis not present

## 2015-11-11 DIAGNOSIS — E119 Type 2 diabetes mellitus without complications: Secondary | ICD-10-CM | POA: Diagnosis not present

## 2015-11-11 DIAGNOSIS — G629 Polyneuropathy, unspecified: Secondary | ICD-10-CM | POA: Diagnosis not present

## 2015-11-20 DIAGNOSIS — E039 Hypothyroidism, unspecified: Secondary | ICD-10-CM | POA: Diagnosis not present

## 2015-11-20 DIAGNOSIS — I10 Essential (primary) hypertension: Secondary | ICD-10-CM | POA: Diagnosis not present

## 2015-11-20 DIAGNOSIS — G629 Polyneuropathy, unspecified: Secondary | ICD-10-CM | POA: Diagnosis not present

## 2015-11-20 DIAGNOSIS — E119 Type 2 diabetes mellitus without complications: Secondary | ICD-10-CM | POA: Diagnosis not present

## 2015-11-28 DIAGNOSIS — R6889 Other general symptoms and signs: Secondary | ICD-10-CM | POA: Diagnosis not present

## 2015-11-28 DIAGNOSIS — M7731 Calcaneal spur, right foot: Secondary | ICD-10-CM | POA: Diagnosis not present

## 2015-11-28 DIAGNOSIS — M7989 Other specified soft tissue disorders: Secondary | ICD-10-CM | POA: Diagnosis not present

## 2015-11-28 DIAGNOSIS — M71572 Other bursitis, not elsewhere classified, left ankle and foot: Secondary | ICD-10-CM | POA: Diagnosis not present

## 2015-11-28 DIAGNOSIS — M722 Plantar fascial fibromatosis: Secondary | ICD-10-CM | POA: Diagnosis not present

## 2015-11-28 DIAGNOSIS — M7732 Calcaneal spur, left foot: Secondary | ICD-10-CM | POA: Diagnosis not present

## 2015-11-28 DIAGNOSIS — M71571 Other bursitis, not elsewhere classified, right ankle and foot: Secondary | ICD-10-CM | POA: Diagnosis not present

## 2016-01-01 DIAGNOSIS — G609 Hereditary and idiopathic neuropathy, unspecified: Secondary | ICD-10-CM | POA: Diagnosis not present

## 2016-01-01 DIAGNOSIS — M792 Neuralgia and neuritis, unspecified: Secondary | ICD-10-CM | POA: Diagnosis not present

## 2016-01-01 DIAGNOSIS — M722 Plantar fascial fibromatosis: Secondary | ICD-10-CM | POA: Diagnosis not present

## 2016-01-01 DIAGNOSIS — R6889 Other general symptoms and signs: Secondary | ICD-10-CM | POA: Diagnosis not present

## 2016-01-16 DIAGNOSIS — I10 Essential (primary) hypertension: Secondary | ICD-10-CM | POA: Diagnosis not present

## 2016-01-16 DIAGNOSIS — E119 Type 2 diabetes mellitus without complications: Secondary | ICD-10-CM | POA: Diagnosis not present

## 2016-01-16 DIAGNOSIS — G629 Polyneuropathy, unspecified: Secondary | ICD-10-CM | POA: Diagnosis not present

## 2016-01-16 DIAGNOSIS — R6889 Other general symptoms and signs: Secondary | ICD-10-CM | POA: Diagnosis not present

## 2016-01-16 DIAGNOSIS — E039 Hypothyroidism, unspecified: Secondary | ICD-10-CM | POA: Diagnosis not present

## 2016-02-21 DIAGNOSIS — R0602 Shortness of breath: Secondary | ICD-10-CM | POA: Diagnosis not present

## 2016-02-21 DIAGNOSIS — I10 Essential (primary) hypertension: Secondary | ICD-10-CM | POA: Diagnosis not present

## 2016-03-05 DIAGNOSIS — R0602 Shortness of breath: Secondary | ICD-10-CM | POA: Diagnosis not present

## 2016-03-05 DIAGNOSIS — E039 Hypothyroidism, unspecified: Secondary | ICD-10-CM | POA: Diagnosis not present

## 2016-03-05 DIAGNOSIS — I10 Essential (primary) hypertension: Secondary | ICD-10-CM | POA: Diagnosis not present

## 2016-03-05 DIAGNOSIS — E119 Type 2 diabetes mellitus without complications: Secondary | ICD-10-CM | POA: Diagnosis not present

## 2016-03-05 DIAGNOSIS — G629 Polyneuropathy, unspecified: Secondary | ICD-10-CM | POA: Diagnosis not present

## 2016-03-19 DIAGNOSIS — I10 Essential (primary) hypertension: Secondary | ICD-10-CM | POA: Diagnosis not present

## 2016-03-19 DIAGNOSIS — G629 Polyneuropathy, unspecified: Secondary | ICD-10-CM | POA: Diagnosis not present

## 2016-03-19 DIAGNOSIS — E119 Type 2 diabetes mellitus without complications: Secondary | ICD-10-CM | POA: Diagnosis not present

## 2016-03-19 DIAGNOSIS — M1990 Unspecified osteoarthritis, unspecified site: Secondary | ICD-10-CM | POA: Diagnosis not present

## 2016-03-19 DIAGNOSIS — R6889 Other general symptoms and signs: Secondary | ICD-10-CM | POA: Diagnosis not present

## 2016-03-19 DIAGNOSIS — E039 Hypothyroidism, unspecified: Secondary | ICD-10-CM | POA: Diagnosis not present

## 2016-04-03 DIAGNOSIS — R0602 Shortness of breath: Secondary | ICD-10-CM | POA: Diagnosis not present

## 2016-04-03 DIAGNOSIS — R05 Cough: Secondary | ICD-10-CM | POA: Diagnosis not present

## 2016-04-03 DIAGNOSIS — I10 Essential (primary) hypertension: Secondary | ICD-10-CM | POA: Diagnosis not present

## 2016-04-03 DIAGNOSIS — G629 Polyneuropathy, unspecified: Secondary | ICD-10-CM | POA: Diagnosis not present

## 2016-04-03 DIAGNOSIS — E039 Hypothyroidism, unspecified: Secondary | ICD-10-CM | POA: Diagnosis not present

## 2016-04-03 DIAGNOSIS — E119 Type 2 diabetes mellitus without complications: Secondary | ICD-10-CM | POA: Diagnosis not present

## 2016-04-03 DIAGNOSIS — Z0000 Encounter for general adult medical examination without abnormal findings: Secondary | ICD-10-CM | POA: Diagnosis not present

## 2016-04-03 DIAGNOSIS — J301 Allergic rhinitis due to pollen: Secondary | ICD-10-CM | POA: Diagnosis not present

## 2016-04-17 DIAGNOSIS — J301 Allergic rhinitis due to pollen: Secondary | ICD-10-CM | POA: Diagnosis not present

## 2016-04-17 DIAGNOSIS — G629 Polyneuropathy, unspecified: Secondary | ICD-10-CM | POA: Diagnosis not present

## 2016-04-17 DIAGNOSIS — J45909 Unspecified asthma, uncomplicated: Secondary | ICD-10-CM | POA: Diagnosis not present

## 2016-04-17 DIAGNOSIS — R0602 Shortness of breath: Secondary | ICD-10-CM | POA: Diagnosis not present

## 2016-04-17 DIAGNOSIS — E039 Hypothyroidism, unspecified: Secondary | ICD-10-CM | POA: Diagnosis not present

## 2016-04-17 DIAGNOSIS — E119 Type 2 diabetes mellitus without complications: Secondary | ICD-10-CM | POA: Diagnosis not present

## 2016-04-17 DIAGNOSIS — I10 Essential (primary) hypertension: Secondary | ICD-10-CM | POA: Diagnosis not present

## 2016-04-24 DIAGNOSIS — R300 Dysuria: Secondary | ICD-10-CM | POA: Diagnosis not present

## 2016-04-24 DIAGNOSIS — E119 Type 2 diabetes mellitus without complications: Secondary | ICD-10-CM | POA: Diagnosis not present

## 2016-04-24 DIAGNOSIS — I10 Essential (primary) hypertension: Secondary | ICD-10-CM | POA: Diagnosis not present

## 2016-04-24 DIAGNOSIS — J301 Allergic rhinitis due to pollen: Secondary | ICD-10-CM | POA: Diagnosis not present

## 2016-04-24 DIAGNOSIS — E039 Hypothyroidism, unspecified: Secondary | ICD-10-CM | POA: Diagnosis not present

## 2016-04-24 DIAGNOSIS — G629 Polyneuropathy, unspecified: Secondary | ICD-10-CM | POA: Diagnosis not present

## 2016-04-24 DIAGNOSIS — J45909 Unspecified asthma, uncomplicated: Secondary | ICD-10-CM | POA: Diagnosis not present

## 2016-05-28 DIAGNOSIS — E119 Type 2 diabetes mellitus without complications: Secondary | ICD-10-CM | POA: Diagnosis not present

## 2016-05-28 DIAGNOSIS — A498 Other bacterial infections of unspecified site: Secondary | ICD-10-CM | POA: Diagnosis not present

## 2016-05-28 DIAGNOSIS — R6889 Other general symptoms and signs: Secondary | ICD-10-CM | POA: Diagnosis not present

## 2016-05-28 DIAGNOSIS — M329 Systemic lupus erythematosus, unspecified: Secondary | ICD-10-CM | POA: Diagnosis not present

## 2016-05-28 DIAGNOSIS — J301 Allergic rhinitis due to pollen: Secondary | ICD-10-CM | POA: Diagnosis not present

## 2016-05-28 DIAGNOSIS — Z0000 Encounter for general adult medical examination without abnormal findings: Secondary | ICD-10-CM | POA: Diagnosis not present

## 2016-05-28 DIAGNOSIS — E039 Hypothyroidism, unspecified: Secondary | ICD-10-CM | POA: Diagnosis not present

## 2016-05-28 DIAGNOSIS — G629 Polyneuropathy, unspecified: Secondary | ICD-10-CM | POA: Diagnosis not present

## 2016-05-28 DIAGNOSIS — I10 Essential (primary) hypertension: Secondary | ICD-10-CM | POA: Diagnosis not present

## 2016-05-28 DIAGNOSIS — J45909 Unspecified asthma, uncomplicated: Secondary | ICD-10-CM | POA: Diagnosis not present

## 2016-06-26 ENCOUNTER — Other Ambulatory Visit: Payer: Self-pay | Admitting: Cardiology

## 2016-06-26 DIAGNOSIS — R0602 Shortness of breath: Secondary | ICD-10-CM | POA: Diagnosis not present

## 2016-06-26 DIAGNOSIS — E119 Type 2 diabetes mellitus without complications: Secondary | ICD-10-CM | POA: Diagnosis not present

## 2016-06-26 DIAGNOSIS — I10 Essential (primary) hypertension: Secondary | ICD-10-CM | POA: Diagnosis not present

## 2016-06-26 DIAGNOSIS — R6889 Other general symptoms and signs: Secondary | ICD-10-CM | POA: Diagnosis not present

## 2016-06-26 DIAGNOSIS — Z0189 Encounter for other specified special examinations: Secondary | ICD-10-CM | POA: Diagnosis not present

## 2016-07-02 ENCOUNTER — Other Ambulatory Visit: Payer: Commercial Managed Care - HMO

## 2016-07-03 DIAGNOSIS — R6889 Other general symptoms and signs: Secondary | ICD-10-CM | POA: Diagnosis not present

## 2016-07-03 DIAGNOSIS — I200 Unstable angina: Secondary | ICD-10-CM | POA: Diagnosis not present

## 2016-07-03 DIAGNOSIS — R0602 Shortness of breath: Secondary | ICD-10-CM | POA: Diagnosis not present

## 2016-07-15 ENCOUNTER — Encounter: Payer: Self-pay | Admitting: *Deleted

## 2016-07-16 ENCOUNTER — Other Ambulatory Visit (INDEPENDENT_AMBULATORY_CARE_PROVIDER_SITE_OTHER): Payer: Medicare HMO

## 2016-07-16 ENCOUNTER — Ambulatory Visit (INDEPENDENT_AMBULATORY_CARE_PROVIDER_SITE_OTHER): Payer: Medicare HMO | Admitting: Internal Medicine

## 2016-07-16 ENCOUNTER — Encounter: Payer: Self-pay | Admitting: Internal Medicine

## 2016-07-16 ENCOUNTER — Other Ambulatory Visit: Payer: Self-pay | Admitting: Internal Medicine

## 2016-07-16 VITALS — BP 112/72 | HR 77 | Ht 67.0 in | Wt 161.8 lb

## 2016-07-16 DIAGNOSIS — Z8739 Personal history of other diseases of the musculoskeletal system and connective tissue: Secondary | ICD-10-CM

## 2016-07-16 DIAGNOSIS — R05 Cough: Secondary | ICD-10-CM

## 2016-07-16 DIAGNOSIS — Z5181 Encounter for therapeutic drug level monitoring: Secondary | ICD-10-CM | POA: Insufficient documentation

## 2016-07-16 DIAGNOSIS — R0989 Other specified symptoms and signs involving the circulatory and respiratory systems: Secondary | ICD-10-CM | POA: Insufficient documentation

## 2016-07-16 DIAGNOSIS — M329 Systemic lupus erythematosus, unspecified: Secondary | ICD-10-CM

## 2016-07-16 DIAGNOSIS — R0600 Dyspnea, unspecified: Secondary | ICD-10-CM

## 2016-07-16 DIAGNOSIS — R6889 Other general symptoms and signs: Secondary | ICD-10-CM | POA: Diagnosis not present

## 2016-07-16 DIAGNOSIS — Z8709 Personal history of other diseases of the respiratory system: Secondary | ICD-10-CM | POA: Diagnosis not present

## 2016-07-16 DIAGNOSIS — Z79899 Other long term (current) drug therapy: Secondary | ICD-10-CM

## 2016-07-16 DIAGNOSIS — R059 Cough, unspecified: Secondary | ICD-10-CM

## 2016-07-16 LAB — NITRIC OXIDE: NITRIC OXIDE: 16

## 2016-07-16 LAB — SEDIMENTATION RATE: Sed Rate: 77 mm/hr — ABNORMAL HIGH (ref 0–30)

## 2016-07-16 NOTE — Progress Notes (Signed)
Subjective:    Patient ID: [name], female    DOB: 1940/11/04, 76 y.o.   MRN: [id number]  PCP Benito Mccreedy, MD  HPI  IOV 07/16/2016  Chief Complaint  Patient presents with  . Shortness of Breath    consult for sob referring doctor is Dr. Einar Gip patient states that she is very sob and using the inhaler helps her very much     According to the referral chart from Dr. Einar Gip, he'll originally saw her on 06/26/2016 for shortness of breath at follow-up. Patient reported to him that she's had insidious onset of shortness of breath for several years with progression in the last several months associated with bilateral chest tightness. Apparently patient was given a diagnosis of asthma and bronchodilator use does help her. Per the patient history spirometry testing outside was abnormal. Dyspnea is significant enough patient not able to do her household work. There is associated fatigue. A trial of diuresis by the primary care physician did help the shortness of breath. Patient's symptoms are associated with lisinopril intake for hypertension.  exhaled nitric oxide test today in the office is 19 ppb and normal.  IgE. ''Walking desaturation test on 07/16/2016 185 feet x 3 laps on room air forehead probe:  did NOT desaturate. Rest pulse ox was 100%, final pulse ox was 100%. HR response 75/min at rest to 85/min at peak exertion. 2-D echocardiogram 02/21/2016 as reviewed from the outside chart shows mild concentric left ventricular remodeling with ejection fraction 79. Normal right ventricle size and contractility. No evidence of diastolic dysfunction. No pericardial effusion    In talking to the patient and her daughter [name] the above information was confirmed. In addition she is on Advair discus for the last few months because of the above problems. It is helping her. She takes it as needed which ends up being every other day once. Today the exhaled nitric oxide was normal 16 ppb which is  essentially on Advair. She is also on lisinopril for the last few years. She does have associated mild cough. Evaluation by cardiology did show crackles in the concern is he has interstitial lung disease therefore she is referred here. Associated with concern of interstitial lung disease is a history of lupus that has been present for many years. When she relocated to [facility] few years ago with a primary care physician has been following this. Patient is on plaquenil but not steroids The family is unsure about SLE blood work  Results for RHYLAN, GROSS (MRN [id number]) as of 07/16/2016 10:45  Ref. Range 01/05/2015 09:31 01/12/2015 16:07 01/12/2015 19:45 01/12/2015 20:00 01/12/2015 20:02  Hemoglobin Latest Ref Range: 12.0 - 15.0 g/dL 10.4 (L) 11.9 (L)          has a past medical history of Breast cancer ([place]); Diabetes mellitus without complication ([place]); Hypertension; Hypothyroidism; and Lupus.   reports that she has never smoked. Her smokeless tobacco use includes Snuff.  Past Surgical History:  Procedure Laterality Date  . BREAST LUMPECTOMY  2009    Allergies  Allergen Reactions  . Ciprofloxacin Itching, Swelling and Other (See Comments)    Possibly causing tremors?    Immunization History  Administered Date(s) Administered  . Influenza,inj,Quad PF,36+ Mos 10/27/2015    Family History  Problem Relation Age of Onset  . Cancer Father        ? type     Current Outpatient Prescriptions:  .  cefUROXime (CEFTIN) 250 MG tablet, Take 1 tablet (250 mg total) by  mouth 2 (two) times daily with a meal., Disp: 8 tablet, Rfl: 0 .  gabapentin (NEURONTIN) 300 MG capsule, Take 300 mg by mouth 2 (two) times daily., Disp: , Rfl:  .  glimepiride (AMARYL) 1 MG tablet, Take 1 mg by mouth daily., Disp: , Rfl: 3 .  hydroxychloroquine (PLAQUENIL) 200 MG tablet, Take 1 tablet (200 mg total) by mouth daily., Disp: 30 tablet, Rfl: 0 .  levothyroxine (SYNTHROID, LEVOTHROID) 50 MCG tablet, Take 50 mcg  by mouth daily before breakfast., Disp: , Rfl:  .  lisinopril-hydrochlorothiazide (PRINZIDE,ZESTORETIC) 20-12.5 MG per tablet, Take 1 tablet by mouth. , Disp: , Rfl:  .  Multiple Vitamin (MULTIVITAMIN WITH MINERALS) TABS tablet, Take 1 tablet by mouth daily., Disp: , Rfl:  .  omeprazole (PRILOSEC) 20 MG capsule, Take 20 mg by mouth daily., Disp: , Rfl:    Review of Systems  Constitutional: Positive for unexpected weight change. Negative for fever.  HENT: Positive for nosebleeds. Negative for congestion, dental problem, ear pain, postnasal drip, rhinorrhea, sinus pressure, sneezing, sore throat and trouble swallowing.   Eyes: Negative for redness and itching.  Respiratory: Positive for cough and shortness of breath. Negative for chest tightness.   Cardiovascular: Negative for palpitations and leg swelling.  Gastrointestinal: Negative for nausea and vomiting.  Genitourinary: Negative for dysuria.  Musculoskeletal: Negative for joint swelling.  Skin: Negative for rash.  Allergic/Immunologic: Negative.  Negative for environmental allergies, food allergies and immunocompromised state.  Neurological: Negative for headaches.  Hematological: Bruises/bleeds easily.  Psychiatric/Behavioral: Negative for dysphoric mood. The patient is not nervous/anxious.        Objective:   Physical Exam  Constitutional: [name], place, and time. She appears well-developed and well-nourished. No distress.  HENT:  Head: Normocephalic and atraumatic.  Right Ear: External ear normal.  Left Ear: External ear normal.  Mouth/Throat: Oropharynx is clear and moist. No oropharyngeal exudate.  Eyes: Conjunctivae and EOM are normal. Pupils are equal, round, and reactive to light. Right eye exhibits no discharge. Left eye exhibits no discharge. No scleral icterus.  Neck: Normal range of motion. Neck supple. No JVD present. No tracheal deviation present. No thyromegaly present.  Cardiovascular: Normal  rate, regular rhythm, normal heart sounds and intact distal pulses.  Exam reveals no gallop and no friction rub.   No murmur heard. Pulmonary/Chest: Effort normal. No respiratory distress. She has no wheezes. She has rales. She exhibits no tenderness.  Abdominal: Soft. Bowel sounds are normal. She exhibits no distension and no mass. There is no tenderness. There is no rebound and no guarding.  Musculoskeletal: Normal range of motion. She exhibits no edema or tenderness.  Lymphadenopathy:    She has no cervical adenopathy.  Neurological: She is alert and oriented to person, place, and time. She has normal reflexes. No cranial nerve deficit. She exhibits normal muscle tone. Coordination normal.  Skin: Skin is warm and dry. No rash noted. She is not diaphoretic. No erythema. No pallor.  Psychiatric: She has a normal mood and affect. Her behavior is normal. Judgment and thought content normal.  Poor historianb  Vitals reviewed.   Vitals:   07/16/16 1036 07/16/16 1038  BP:  112/72  Pulse:  77  SpO2:  98%  Weight: 161 lb 12.8 oz (73.4 kg)   Height: 5\' 7"  (1.702 m)     Body mass index is 25.34 kg/m.       Assessment & Plan:     ICD-10-CM   1. Dyspnea, unspecified  type R06.00   2. Bibasilar crackles R09.89   3. History of systemic lupus erythematosus (SLE) Z87.39   4. Cough R05   5. Encounter for monitoring ACE-inhibitor therapy Z51.81    Z79.899   6. History of asthma Z87.09     Currently asthma seems under control Lisinopril could be contributing to cough but we will address this later Concern is Pulmonary Fibrosis Need to rule out pulmonary fibrosis and also SLE activity status  PLAN - Do RF, CCP, Sed Rate, DS-DNA,  ANA, SSa/SSB, SCl-70 - do HRCT supine and prone -Do  Pre-bd spiro and dlco only. No lung volume or bd response.   Followup 07/30/16 but after completing above test Might have to consider stopping ace inhiitor at followup    Dr. Brand Males, M.D.,  [facility].C.P Pulmonary and Critical Care Medicine Staff Physician [facility] Pager: [phone], If no answer or between  15:00h - 7:00h: call [phone]  07/16/2016 11:45 AM

## 2016-07-16 NOTE — Patient Instructions (Addendum)
ICD-10-CM   1. Dyspnea, unspecified type R06.00   2. Bibasilar crackles R09.89   3. History of systemic lupus erythematosus (SLE) Z87.39   4. Cough R05   5. Encounter for monitoring ACE-inhibitor therapy Z51.81    Z79.899   6. History of asthma Z87.09     Currently asthma seems under control Lisinopril could be contributing to cough but we will address this later Concern is Pulmonary Fibrosis Need to rule out pulmonary fibrosis and also SLE activity status  PLAN - Do RF, CCP, Sed Rate, DS-DNA,  ANA, SSa/SSB, SCl-70 - do HRCT supine and prone -Do  Pre-bd spiro and dlco only. No lung volume or bd response.   Followup 07/30/16 but after completing above test

## 2016-07-17 LAB — ANTI-SCLERODERMA ANTIBODY: SCLERODERMA (SCL-70) (ENA) ANTIBODY, IGG: NEGATIVE

## 2016-07-17 LAB — RHEUMATOID FACTOR: Rhuematoid fact SerPl-aCnc: <14 IU/mL (ref <14)

## 2016-07-17 LAB — CYCLIC CITRUL PEPTIDE ANTIBODY, IGG: Cyclic Citrullin Peptide Ab: <16 Units

## 2016-07-17 LAB — ANA: ANA: POSITIVE — AB

## 2016-07-17 LAB — SJOGRENS SYNDROME-A EXTRACTABLE NUCLEAR ANTIBODY: SSA (Ro) (ENA) Antibody, IgG: >8 — ABNORMAL HIGH

## 2016-07-17 LAB — ANTI-DNA ANTIBODY, DOUBLE-STRANDED: ds DNA Ab: 1 IU/mL

## 2016-07-17 LAB — SJOGRENS SYNDROME-B EXTRACTABLE NUCLEAR ANTIBODY: SSB (La) (ENA) Antibody, IgG: <1

## 2016-07-17 LAB — ANTI-NUCLEAR AB-TITER (ANA TITER)

## 2016-07-22 ENCOUNTER — Ambulatory Visit (INDEPENDENT_AMBULATORY_CARE_PROVIDER_SITE_OTHER)
Admission: RE | Admit: 2016-07-22 | Discharge: 2016-07-22 | Disposition: A | Discharge status: Home / Self Care | Payer: Medicare HMO | Source: Ambulatory Visit | Attending: Internal Medicine | Admitting: Internal Medicine

## 2016-07-22 DIAGNOSIS — R0600 Dyspnea, unspecified: Secondary | ICD-10-CM | POA: Diagnosis not present

## 2016-07-22 DIAGNOSIS — R05 Cough: Secondary | ICD-10-CM | POA: Diagnosis not present

## 2016-07-22 DIAGNOSIS — R059 Cough, unspecified: Secondary | ICD-10-CM

## 2016-07-22 DIAGNOSIS — R0989 Other specified symptoms and signs involving the circulatory and respiratory systems: Secondary | ICD-10-CM | POA: Diagnosis not present

## 2016-07-22 DIAGNOSIS — M329 Systemic lupus erythematosus, unspecified: Secondary | ICD-10-CM

## 2016-07-22 DIAGNOSIS — R6889 Other general symptoms and signs: Secondary | ICD-10-CM | POA: Diagnosis not present

## 2016-07-23 ENCOUNTER — Telehealth: Payer: Self-pay | Admitting: Internal Medicine

## 2016-07-23 NOTE — Telephone Encounter (Signed)
Seeing her 07/30/16  Autoimmune - positive ? Sjogren  HRCT  ILD +. Other findings too  Dr. Brand Males, M.D., [facility].C.P Pulmonary and Critical Care Medicine Staff Physician [facility] Pager: [phone], If no answer or between  15:00h - 7:00h: call [phone]  07/23/2016 8:26 AM

## 2016-07-30 ENCOUNTER — Ambulatory Visit (INDEPENDENT_AMBULATORY_CARE_PROVIDER_SITE_OTHER): Payer: Medicare HMO | Admitting: Internal Medicine

## 2016-07-30 ENCOUNTER — Encounter: Payer: Self-pay | Admitting: Internal Medicine

## 2016-07-30 DIAGNOSIS — M329 Systemic lupus erythematosus, unspecified: Secondary | ICD-10-CM

## 2016-07-30 DIAGNOSIS — J849 Interstitial pulmonary disease, unspecified: Secondary | ICD-10-CM | POA: Insufficient documentation

## 2016-07-30 DIAGNOSIS — R05 Cough: Secondary | ICD-10-CM | POA: Diagnosis not present

## 2016-07-30 DIAGNOSIS — R059 Cough, unspecified: Secondary | ICD-10-CM

## 2016-07-30 DIAGNOSIS — R6889 Other general symptoms and signs: Secondary | ICD-10-CM | POA: Diagnosis not present

## 2016-07-30 DIAGNOSIS — R0989 Other specified symptoms and signs involving the circulatory and respiratory systems: Secondary | ICD-10-CM

## 2016-07-30 NOTE — Assessment & Plan Note (Signed)
This might be from fibrosis but also lisinopril  Plan pleease talk to PCP Benito Mccreedy, MD and stop lisinopril

## 2016-07-30 NOTE — Progress Notes (Signed)
PFT could not be performed today due the pt's inability to comprehend how to perform Spirometry and DLCO.

## 2016-07-30 NOTE — Patient Instructions (Signed)
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

## 2016-07-30 NOTE — Assessment & Plan Note (Signed)
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

## 2016-07-30 NOTE — Progress Notes (Signed)
Subjective:     Patient ID: [name], female   DOB: 1940-05-06, 76 y.o.   MRN: [id number]  HPI   PCP Benito Mccreedy, MD  HPI  IOV 07/16/2016  Chief Complaint  Patient presents with  . Shortness of Breath    consult for sob referring doctor is Dr. Einar Gip patient states that she is very sob and using the inhaler helps her very much     According to the referral chart from Dr. Einar Gip, he'll originally saw her on 06/26/2016 for shortness of breath at follow-up. Patient reported to him that she's had insidious onset of shortness of breath for several years with progression in the last several months associated with bilateral chest tightness. Apparently patient was given a diagnosis of asthma and bronchodilator use does help her. Per the patient history spirometry testing outside was abnormal. Dyspnea is significant enough patient not able to do her household work. There is associated fatigue. A trial of diuresis by the primary care physician did help the shortness of breath. Patient's symptoms are associated with lisinopril intake for hypertension.  exhaled nitric oxide test today in the office is 19 ppb and normal.  IgE. ''Walking desaturation test on 07/16/2016 185 feet x 3 laps on room air forehead probe:  did NOT desaturate. Rest pulse ox was 100%, final pulse ox was 100%. HR response 75/min at rest to 85/min at peak exertion. 2-D echocardiogram 02/21/2016 as reviewed from the outside chart shows mild concentric left ventricular remodeling with ejection fraction 79. Normal right ventricle size and contractility. No evidence of diastolic dysfunction. No pericardial effusion    In talking to the patient and her daughter [name] the above information was confirmed. In addition she is on Advair discus for the last few months because of the above problems. It is helping her. She takes it as needed which ends up being every other day once. Today the exhaled nitric oxide was normal 16 ppb which is  essentially on Advair. She is also on lisinopril for the last few years. She does have associated mild cough. Evaluation by cardiology did show crackles in the concern is he has interstitial lung disease therefore she is referred here. Associated with concern of interstitial lung disease is a history of lupus that has been present for many years. When she relocated to [facility] few years ago with a primary care physician has been following this. Patient is on plaquenil but not steroids The family is unsure about SLE blood work  Results for [name], [name] (MRN [id number]) as of 07/16/2016 10:45  Ref. Range 01/05/2015 09:31 01/12/2015 16:07 01/12/2015 19:45 01/12/2015 20:00 01/12/2015 20:02  Hemoglobin Latest Ref Range: 12.0 - 15.0 g/dL 10.4 (L) 11.9 (L)        OV 07/30/2016  Chief Complaint  Patient presents with  . Follow-up    Pt was here for a PFT but was unable to complete it. Pt continues to be SOB with some improvement, occ. dry cough.   Here to follow-up for pulmonary test results for interstitial lung disease workup. In terms of test results her ESR is high ANA is positive and her Sjogren's antibodies positive but lupus antibodies and rheumatoid factor antibodies are negative. Her CT scan does show interstitial lung disease at the base. She could not complete pulmonary function test due to inability to follow direction but I suspect the disease is mild because of walking desaturation test last visit was mild. Her daughter and she appeared to understand these test results. She  reports dyspnea and cough. She is on ACE inhibitor.  Results for KEARSTEN, GINTHER (MRN [id number]) as of 07/30/2016 10:00  Ref. Range 07/16/2016 12:09 07/16/2016 12:18  Nitric Oxide Unknown  16  Sed Rate Latest Ref Range: 0 - 30 mm/hr 77 (H)   Anit Nuclear Antibody(ANA) Latest Ref Range: NEGATIVE  POS (A)   ANA Pattern 1 Unknown NUCLEOLAR (A)   ANA Titer 1 Latest Units: titer 7:628 (H)   Cyclic Citrullin Peptide Ab Latest  Units: Units <16   ds DNA Ab Latest Units: IU/mL 1   RA Latex Turbid. Latest Ref Range: <14 IU/mL <14   SSA (Ro) (ENA) Antibody, IgG Latest Ref Range: <1.0 NEG AI  >8.0 POS (H)   SSB (La) (ENA) Antibody, IgG Latest Ref Range: <1.0 NEG AI  <1.0 NEG   Scleroderma (Scl-70) (ENA) Antibody, IgG Latest Ref Range: <1.0 NEG AI  <1.0 NEG     IMPRESSION: CT CHEST HIGH RESOLUTION 1. 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 Differential includes fibrotic nonspecific interstitial pneumonia (NSIP). Follow-up high-resolution chest CT in 12 months would be useful to assess temporal pattern stability, as clinically warranted. 2. Solitary 3 mm solid right upper lobe pulmonary nodule. No follow-up needed if patient is low-risk. Non-contrast chest CT can be considered in 12 months if patient is high-risk. This recommendation follows the consensus statement: Guidelines for Management of Incidental Pulmonary Nodules Detected on CT Images: From the Fleischner Society 2017; Radiology 2017; 284:228-243. 3. Left main and 3 vessel coronary atherosclerosis. 4. Small hiatal hernia. 5. Diffuse hepatic steatosis.  Aortic Atherosclerosis (ICD10-I70.0) and Emphysema (ICD10-J43.9).   Electronically Signed   By: [name] M.D.   On: 07/22/2016 14:17    has a past medical history of Breast cancer ([place]); Diabetes mellitus without complication ([place]); Hypertension; Hypothyroidism; and Lupus.   reports that she has never smoked. Her smokeless tobacco use includes Snuff.  Past Surgical History:  Procedure Laterality Date  . BREAST LUMPECTOMY  2009    Allergies  Allergen Reactions  . Ciprofloxacin Itching, Swelling and Other (See Comments)    Possibly causing tremors?    Immunization History  Administered Date(s) Administered  . Influenza,inj,Quad PF,36+ Mos  10/27/2015    Family History  Problem Relation Age of Onset  . Cancer Father        ? type     Current Outpatient Prescriptions:  .  ADVAIR DISKUS 100-50 MCG/DOSE AEPB, Inhale 2 puffs into the lungs daily., Disp: , Rfl: 1 .  cefUROXime (CEFTIN) 250 MG tablet, Take 1 tablet (250 mg total) by mouth 2 (two) times daily with a meal., Disp: 8 tablet, Rfl: 0 .  gabapentin (NEURONTIN) 300 MG capsule, Take 300 mg by mouth 2 (two) times daily., Disp: , Rfl:  .  glimepiride (AMARYL) 1 MG tablet, Take 1 mg by mouth daily., Disp: , Rfl: 3 .  hydroxychloroquine (PLAQUENIL) 200 MG tablet, Take 1 tablet (200 mg total) by mouth daily., Disp: 30 tablet, Rfl: 0 .  levothyroxine (SYNTHROID, LEVOTHROID) 50 MCG tablet, Take 50 mcg by mouth daily before breakfast., Disp: , Rfl:  .  lisinopril-hydrochlorothiazide (PRINZIDE,ZESTORETIC) 20-12.5 MG per tablet, Take 1 tablet by mouth. , Disp: , Rfl:  .  Multiple Vitamin (MULTIVITAMIN WITH MINERALS) TABS tablet, Take 1 tablet by mouth daily., Disp: , Rfl:  .  omeprazole (PRILOSEC) 20 MG capsule, Take 20 mg by mouth daily., Disp: , Rfl:  Review of Systems     Objective:   Physical Exam Vitals:   07/30/16 0916  BP: 118/70  Pulse: 60  SpO2: 100%  Weight: 160 lb (72.6 kg)  Height: 5' 7" (1.702 m)   Estimated body mass index is 25.06 kg/m as calculated from the following:   Height as of this encounter: 5' 7" (1.702 m).   Weight as of this encounter: 160 lb (72.6 kg). creackles Poor historian    Assessment:       ICD-10-CM   1. ILD (interstitial lung disease) ([place]) J84.9   2. 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 MBrand Males M.D., FE[facility] HospitalC.P Pulmonary and Critical Care Medicine Staff Physician [facility] [place] Pager: [phone] If no answer or between  15:00h - 7:00h: call [phone]  07/30/2016 10:13 AM

## 2016-08-06 DIAGNOSIS — Z6825 Body mass index (BMI) 25.0-25.9, adult: Secondary | ICD-10-CM | POA: Diagnosis not present

## 2016-08-06 DIAGNOSIS — N183 Chronic kidney disease, stage 3 (moderate): Secondary | ICD-10-CM | POA: Diagnosis not present

## 2016-08-06 DIAGNOSIS — E663 Overweight: Secondary | ICD-10-CM | POA: Diagnosis not present

## 2016-08-06 DIAGNOSIS — J849 Interstitial pulmonary disease, unspecified: Secondary | ICD-10-CM | POA: Diagnosis not present

## 2016-08-06 DIAGNOSIS — M3219 Other organ or system involvement in systemic lupus erythematosus: Secondary | ICD-10-CM | POA: Diagnosis not present

## 2016-08-06 DIAGNOSIS — M25562 Pain in left knee: Secondary | ICD-10-CM | POA: Diagnosis not present

## 2016-08-20 DIAGNOSIS — R6889 Other general symptoms and signs: Secondary | ICD-10-CM | POA: Diagnosis not present

## 2016-08-21 DIAGNOSIS — E663 Overweight: Secondary | ICD-10-CM | POA: Diagnosis not present

## 2016-08-21 DIAGNOSIS — Z6825 Body mass index (BMI) 25.0-25.9, adult: Secondary | ICD-10-CM | POA: Diagnosis not present

## 2016-08-21 DIAGNOSIS — M3219 Other organ or system involvement in systemic lupus erythematosus: Secondary | ICD-10-CM | POA: Diagnosis not present

## 2016-08-21 DIAGNOSIS — M25562 Pain in left knee: Secondary | ICD-10-CM | POA: Diagnosis not present

## 2016-08-21 DIAGNOSIS — N183 Chronic kidney disease, stage 3 (moderate): Secondary | ICD-10-CM | POA: Diagnosis not present

## 2016-08-21 DIAGNOSIS — J849 Interstitial pulmonary disease, unspecified: Secondary | ICD-10-CM | POA: Diagnosis not present

## 2016-08-28 DIAGNOSIS — I10 Essential (primary) hypertension: Secondary | ICD-10-CM | POA: Diagnosis not present

## 2016-08-28 DIAGNOSIS — M329 Systemic lupus erythematosus, unspecified: Secondary | ICD-10-CM | POA: Diagnosis not present

## 2016-08-28 DIAGNOSIS — E039 Hypothyroidism, unspecified: Secondary | ICD-10-CM | POA: Diagnosis not present

## 2016-08-28 DIAGNOSIS — E119 Type 2 diabetes mellitus without complications: Secondary | ICD-10-CM | POA: Diagnosis not present

## 2016-08-28 DIAGNOSIS — J301 Allergic rhinitis due to pollen: Secondary | ICD-10-CM | POA: Diagnosis not present

## 2016-08-28 DIAGNOSIS — R6889 Other general symptoms and signs: Secondary | ICD-10-CM | POA: Diagnosis not present

## 2016-08-28 DIAGNOSIS — J45909 Unspecified asthma, uncomplicated: Secondary | ICD-10-CM | POA: Diagnosis not present

## 2016-08-28 DIAGNOSIS — G629 Polyneuropathy, unspecified: Secondary | ICD-10-CM | POA: Diagnosis not present

## 2016-10-22 DIAGNOSIS — Z2821 Immunization not carried out because of patient refusal: Secondary | ICD-10-CM | POA: Diagnosis not present

## 2016-10-22 DIAGNOSIS — E039 Hypothyroidism, unspecified: Secondary | ICD-10-CM | POA: Diagnosis not present

## 2016-10-22 DIAGNOSIS — G629 Polyneuropathy, unspecified: Secondary | ICD-10-CM | POA: Diagnosis not present

## 2016-10-22 DIAGNOSIS — I10 Essential (primary) hypertension: Secondary | ICD-10-CM | POA: Diagnosis not present

## 2016-10-22 DIAGNOSIS — E119 Type 2 diabetes mellitus without complications: Secondary | ICD-10-CM | POA: Diagnosis not present

## 2016-10-22 DIAGNOSIS — J45909 Unspecified asthma, uncomplicated: Secondary | ICD-10-CM | POA: Diagnosis not present

## 2016-10-22 DIAGNOSIS — M329 Systemic lupus erythematosus, unspecified: Secondary | ICD-10-CM | POA: Diagnosis not present

## 2016-10-22 DIAGNOSIS — J301 Allergic rhinitis due to pollen: Secondary | ICD-10-CM | POA: Diagnosis not present

## 2016-11-24 DIAGNOSIS — E039 Hypothyroidism, unspecified: Secondary | ICD-10-CM | POA: Diagnosis not present

## 2016-11-24 DIAGNOSIS — M329 Systemic lupus erythematosus, unspecified: Secondary | ICD-10-CM | POA: Diagnosis not present

## 2016-11-24 DIAGNOSIS — I10 Essential (primary) hypertension: Secondary | ICD-10-CM | POA: Diagnosis not present

## 2016-11-24 DIAGNOSIS — J301 Allergic rhinitis due to pollen: Secondary | ICD-10-CM | POA: Diagnosis not present

## 2016-11-24 DIAGNOSIS — E119 Type 2 diabetes mellitus without complications: Secondary | ICD-10-CM | POA: Diagnosis not present

## 2016-11-24 DIAGNOSIS — G629 Polyneuropathy, unspecified: Secondary | ICD-10-CM | POA: Diagnosis not present

## 2016-11-24 DIAGNOSIS — J45909 Unspecified asthma, uncomplicated: Secondary | ICD-10-CM | POA: Diagnosis not present

## 2016-12-16 DIAGNOSIS — E039 Hypothyroidism, unspecified: Secondary | ICD-10-CM | POA: Diagnosis not present

## 2016-12-16 DIAGNOSIS — G629 Polyneuropathy, unspecified: Secondary | ICD-10-CM | POA: Diagnosis not present

## 2017-01-13 DIAGNOSIS — G629 Polyneuropathy, unspecified: Secondary | ICD-10-CM | POA: Diagnosis not present

## 2017-01-13 DIAGNOSIS — M329 Systemic lupus erythematosus, unspecified: Secondary | ICD-10-CM | POA: Diagnosis not present

## 2017-01-13 DIAGNOSIS — N289 Disorder of kidney and ureter, unspecified: Secondary | ICD-10-CM | POA: Diagnosis not present

## 2017-01-13 DIAGNOSIS — R6889 Other general symptoms and signs: Secondary | ICD-10-CM | POA: Diagnosis not present

## 2017-01-13 DIAGNOSIS — E119 Type 2 diabetes mellitus without complications: Secondary | ICD-10-CM | POA: Diagnosis not present

## 2017-01-13 DIAGNOSIS — J45909 Unspecified asthma, uncomplicated: Secondary | ICD-10-CM | POA: Diagnosis not present

## 2017-01-13 DIAGNOSIS — I10 Essential (primary) hypertension: Secondary | ICD-10-CM | POA: Diagnosis not present

## 2017-01-13 DIAGNOSIS — E039 Hypothyroidism, unspecified: Secondary | ICD-10-CM | POA: Diagnosis not present

## 2017-01-13 DIAGNOSIS — J301 Allergic rhinitis due to pollen: Secondary | ICD-10-CM | POA: Diagnosis not present

## 2017-01-18 DIAGNOSIS — I10 Essential (primary) hypertension: Secondary | ICD-10-CM | POA: Diagnosis not present

## 2017-01-18 DIAGNOSIS — J45909 Unspecified asthma, uncomplicated: Secondary | ICD-10-CM | POA: Diagnosis not present

## 2017-01-18 DIAGNOSIS — E039 Hypothyroidism, unspecified: Secondary | ICD-10-CM | POA: Diagnosis not present

## 2017-01-18 DIAGNOSIS — E119 Type 2 diabetes mellitus without complications: Secondary | ICD-10-CM | POA: Diagnosis not present

## 2017-01-18 DIAGNOSIS — J301 Allergic rhinitis due to pollen: Secondary | ICD-10-CM | POA: Diagnosis not present

## 2017-01-18 DIAGNOSIS — G629 Polyneuropathy, unspecified: Secondary | ICD-10-CM | POA: Diagnosis not present

## 2017-01-18 DIAGNOSIS — M329 Systemic lupus erythematosus, unspecified: Secondary | ICD-10-CM | POA: Diagnosis not present

## 2017-01-18 DIAGNOSIS — N289 Disorder of kidney and ureter, unspecified: Secondary | ICD-10-CM | POA: Diagnosis not present

## 2017-02-02 ENCOUNTER — Ambulatory Visit (INDEPENDENT_AMBULATORY_CARE_PROVIDER_SITE_OTHER): Payer: Medicare HMO | Admitting: Internal Medicine

## 2017-02-02 ENCOUNTER — Encounter: Payer: Self-pay | Admitting: Internal Medicine

## 2017-02-02 ENCOUNTER — Ambulatory Visit: Payer: Medicare HMO

## 2017-02-02 VITALS — BP 128/62 | HR 67 | Ht 67.0 in | Wt 157.2 lb

## 2017-02-02 DIAGNOSIS — J849 Interstitial pulmonary disease, unspecified: Secondary | ICD-10-CM

## 2017-02-02 DIAGNOSIS — R059 Cough, unspecified: Secondary | ICD-10-CM

## 2017-02-02 DIAGNOSIS — R05 Cough: Secondary | ICD-10-CM

## 2017-02-02 DIAGNOSIS — R6889 Other general symptoms and signs: Secondary | ICD-10-CM | POA: Diagnosis not present

## 2017-02-02 NOTE — Progress Notes (Signed)
Subjective:     Patient ID: [name], female   DOB: March 26, 1940, 77 y.o.   MRN: [id number]  HPI   PCP Kathryn Mccreedy, MD  HPI  IOV 07/16/2016  Chief Complaint  Patient presents with  . Shortness of Breath    consult for sob referring doctor is Dr. Einar [name] patient states that she is very sob and using the inhaler helps her very much     According to the referral chart from Dr. Einar [name], he'll originally saw her on 06/26/2016 for shortness of breath at follow-up. Patient reported to him that she's had insidious onset of shortness of breath for several years with progression in the last several months associated with bilateral chest tightness. Apparently patient was given a diagnosis of asthma and bronchodilator use does help her. Per the patient history spirometry testing outside was abnormal. Dyspnea is significant enough patient not able to do her household work. There is associated fatigue. A trial of diuresis by the primary care physician did help the shortness of breath. Patient's symptoms are associated with lisinopril intake for hypertension.  exhaled nitric oxide test today in the office is 19 ppb and normal.  IgE. ''Walking desaturation test on 07/16/2016 185 feet x 3 laps on room air forehead probe:  did NOT desaturate. Rest pulse ox was 100%, final pulse ox was 100%. HR response 75/min at rest to 85/min at peak exertion. 2-D echocardiogram 02/21/2016 as reviewed from the outside chart shows mild concentric left ventricular remodeling with ejection fraction 79. Normal right ventricle size and contractility. No evidence of diastolic dysfunction. No pericardial effusion    In talking to the patient and her daughter [name] the above information was confirmed. In addition she is on Advair discus for the last few months because of the above problems. It is helping her. She takes it as needed which ends up being every other day once. Today the exhaled nitric oxide was normal 16 ppb which is  essentially on Advair. She is also on lisinopril for the last few years. She does have associated mild cough. Evaluation by cardiology did show crackles in the concern is he has interstitial lung disease therefore she is referred here. Associated with concern of interstitial lung disease is a history of lupus that has been present for many years. When she relocated to [facility] few years ago with a primary care physician has been following this. Patient is on plaquenil but not steroids The family is unsure about SLE blood work  Results for Kathryn, [name] (MRN [id number]) as of 07/16/2016 10:45  Ref. Range 01/05/2015 09:31 01/12/2015 16:07 01/12/2015 19:45 01/12/2015 20:00 01/12/2015 20:02  Hemoglobin Latest Ref Range: 12.0 - 15.0 g/dL 10.4 (L) 11.9 (L)        OV 07/30/2016  Chief Complaint  Patient presents with  . Follow-up    Pt was here for a PFT but was unable to complete it. Pt continues to be SOB with some improvement, occ. dry cough.   Here to follow-up for pulmonary test results for interstitial lung disease workup. In terms of test results her ESR is high ANA is positive and her Sjogren's antibodies positive but lupus antibodies and rheumatoid factor antibodies are negative. Her CT scan does show interstitial lung disease at the base. She could not complete pulmonary function test due to inability to follow direction but I suspect the disease is mild because of walking desaturation test last visit was mild. Her daughter and she appeared to understand these test results. She  reports dyspnea and cough. She is on ACE inhibitor.    OV 02/02/2017  Chief Complaint  Patient presents with  . Follow-up    Pt states she  has been doing good since last visit. Denies any complaints of cough, SOB, or CP.   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  In the interim she has no complaints.  She only has mild dyspnea on  exertion.  Walking desaturation test on 85 feet x3 laps on room air.  Resting pulse ox 100%.  Final pulse ox 97%.  Resting heart rate was 70/min.  Final heart rate was 87/min.  She did see a rheumatologist Dr. Gavin [name] in practice last month.  I reviewed the note.  It appears that they are not finding any other systemic evidence of autoimmune disease.  Therefore she is still maintained on Plaquenil.  According to the notes patient is also Smith and RNP antibody positive.  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  Most recent CT scan of the chest in summer 2018 as a possible/probable UIP.  Results for Kathryn, [name] (MRN [id number]) as of 07/30/2016 10:00  Ref. Range 07/16/2016 12:09 07/16/2016 12:18  Nitric Oxide Unknown  16  Sed Rate Latest Ref Range: 0 - 30 mm/hr 77 (H)   Anit Nuclear Antibody(ANA) Latest Ref Range: NEGATIVE  POS (A)   ANA Pattern 1 Unknown NUCLEOLAR (A)   ANA Titer 1 Latest Units: titer 2:035 (H)   Cyclic Citrullin Peptide Ab Latest Units: Units <16   ds DNA Ab Latest Units: IU/mL 1   RA Latex Turbid. Latest Ref Range: <14 IU/mL <14   SSA (Ro) (ENA) Antibody, IgG Latest Ref Range: <1.0 NEG AI  >8.0 POS (H)   SSB (La) (ENA) Antibody, IgG Latest Ref Range: <1.0 NEG AI  <1.0 NEG   Scleroderma (Scl-70) (ENA) Antibody, IgG Latest Ref Range: <1.0 NEG AI  <1.0 NEG     IMPRESSION: CT CHEST HIGH RESOLUTION 1. 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 Differential includes fibrotic nonspecific interstitial pneumonia (NSIP). Follow-up high-resolution chest CT in 12 months would be useful to assess temporal pattern stability, as clinically warranted. 2. Solitary 3 mm solid right upper lobe pulmonary nodule. No follow-up needed if patient is low-risk. Non-contrast chest CT can be considered in 12 months if patient is  high-risk. This recommendation follows the consensus statement: Guidelines for Management of Incidental Pulmonary Nodules Detected on CT Images: From the Fleischner Society 2017; Radiology 2017; 284:228-243. 3. Left main and 3 vessel coronary atherosclerosis. 4. Small hiatal hernia. 5. Diffuse hepatic steatosis.  Aortic Atherosclerosis (ICD10-I70.0) and Emphysema (ICD10-J43.9).   Electronically Signed   By: [name] M.D.   On: 07/22/2016 14:17  OV 02/02/2017  Chief Complaint  Patient presents with  . Follow-up    Pt states she  has been doing good since last visit. Denies any complaints of cough, SOB, or CP.        has a past medical history of Breast cancer ([place]), Diabetes mellitus without complication ([place]), Hypertension, Hypothyroidism, and Lupus.   reports that  has never smoked. She quit smokeless tobacco use about 2 years ago. Her smokeless tobacco use included snuff.  Past Surgical History:  Procedure Laterality Date  . BREAST LUMPECTOMY  2009    Allergies  Allergen Reactions  .  Ciprofloxacin Itching, Swelling and Other (See Comments)    Possibly causing tremors?    Immunization History  Administered Date(s) Administered  . Influenza, High Dose Seasonal PF 11/09/2016  . Influenza,inj,Quad PF,6+ Mos 10/27/2015    Family History  Problem Relation Age of Onset  . Cancer Father        ? type     Current Outpatient Medications:  .  ADVAIR DISKUS 100-50 MCG/DOSE AEPB, Inhale 2 puffs into the lungs daily., Disp: , Rfl: 1 .  cefUROXime (CEFTIN) 250 MG tablet, Take 1 tablet (250 mg total) by mouth 2 (two) times daily with a meal., Disp: 8 tablet, Rfl: 0 .  gabapentin (NEURONTIN) 300 MG capsule, Take 300 mg by mouth 2 (two) times daily., Disp: , Rfl:  .  glimepiride (AMARYL) 1 MG tablet, Take 1 mg by mouth daily., Disp: , Rfl: 3 .  hydroxychloroquine (PLAQUENIL) 200 MG tablet, Take 1 tablet (200 mg total) by mouth daily., Disp: 30 tablet, Rfl: 0 .   levothyroxine (SYNTHROID, LEVOTHROID) 50 MCG tablet, Take 50 mcg by mouth daily before breakfast., Disp: , Rfl:  .  losartan-hydrochlorothiazide (HYZAAR) 50-12.5 MG tablet, TK 1 T PO  ONCE A DAY, Disp: , Rfl: 1 .  Multiple Vitamin (MULTIVITAMIN WITH MINERALS) TABS tablet, Take 1 tablet by mouth daily., Disp: , Rfl:  .  omeprazole (PRILOSEC) 20 MG capsule, Take 20 mg by mouth daily., Disp: , Rfl:    Review of Systems     Objective:   Physical Exam  Constitutional: [name], place, and time. She appears well-developed and well-nourished. No distress.  HENT:  Head: Normocephalic and atraumatic.  Right Ear: External ear normal.  Left Ear: External ear normal.  Mouth/Throat: Oropharynx is clear and moist. No oropharyngeal exudate.  Eyes: Conjunctivae and EOM are normal. Pupils are equal, round, and reactive to light. Right eye exhibits no discharge. Left eye exhibits no discharge. No scleral icterus.  Neck: Normal range of motion. Neck supple. No JVD present. No tracheal deviation present. No thyromegaly present.  Cardiovascular: Normal rate, regular rhythm, normal heart sounds and intact distal pulses. Exam reveals no gallop and no friction rub.  No murmur heard. Pulmonary/Chest: Effort normal. No respiratory distress. She has no wheezes. She has rales. She exhibits no tenderness.  Abdominal: Soft. Bowel sounds are normal. She exhibits no distension and no mass. There is no tenderness. There is no rebound and no guarding.  Musculoskeletal: Normal range of motion. She exhibits no edema or tenderness.  Lymphadenopathy:    She has no cervical adenopathy.  Neurological: She is alert and oriented to person, place, and time. She has normal reflexes. No cranial nerve deficit. She exhibits normal muscle tone. Coordination normal.  Skin: Skin is warm and dry. No rash noted. She is not diaphoretic. No erythema. No pallor.  Discolored more so in the malar area of face  Psychiatric: She  has a normal mood and affect. Her behavior is normal. Judgment and thought content normal.  Vitals reviewed.  Vitals:   02/02/17 1113  BP: 128/62  Pulse: 67  SpO2: 100%  Weight: 157 lb 3.2 oz (71.3 kg)  Height: 5' 7"  (1.702 m)    Estimated body mass index is 24.62 kg/m as calculated from the following:   Height as of this encounter: 5' 7"  (1.702 m).   Weight as of this encounter: 157 lb 3.2 oz (71.3 kg).     Assessment:       ICD-10-CM  1. ILD (interstitial lung disease) ([place]) J84.9   2. Cough R05        Plan:     ILD (interstitial lung disease) ([place]) You have pulmonary fibrosis THis is liely due to your autoimmune condition v another disease called IPF This disease can unpredictably get worse especially given the CT findings of probable UIP. In an effort to slow it down sometimes strong medications are indicated and the medication can vary if is SLE v IPF   Cough Glad better after stopping lisinopril     Plan I will call  Dr Trudie Reed to discusss therapies; I think patient if definitely has a history of lupus and his multiple antibodies positive will probably benefit from CellCept.  Followup 3 months do HRCT Return after 3 months after HRCT    > 50% of this > 25 min visit spent in face to face counseling or coordination of care    Dr. Brand Males, M.D., [facility].C.P Pulmonary and Critical Care Medicine Staff Physician, [place] Director - Interstitial Lung Disease  Program  Pulmonary [place] [facility] [place], [place], [address]  Pager: [phone], If no answer or between  15:00h - 7:00h: call [phone] Telephone: [phone]

## 2017-02-02 NOTE — Patient Instructions (Addendum)
ILD (interstitial lung disease) ([place]) You have pulmonary fibrosis THis is liely due to your autoimmune condition v another disease called IPF This disease can unpredictably get worse In an effort to slow it down sometimes strong medications are indicated and the medication can vary if is SLE v IPF   Cough Glad better after stopping lisinopril     Plan I will call  Dr Trudie Reed to discusss therapies  Followup 3 months do HRCT Return after 3 months after HRCT

## 2017-02-11 ENCOUNTER — Inpatient Hospital Stay: Admission: RE | Admit: 2017-02-11 | Payer: Medicare HMO | Source: Ambulatory Visit

## 2017-02-15 ENCOUNTER — Ambulatory Visit (INDEPENDENT_AMBULATORY_CARE_PROVIDER_SITE_OTHER)
Admission: RE | Admit: 2017-02-15 | Discharge: 2017-02-15 | Disposition: A | Discharge status: Home / Self Care | Payer: Medicare HMO | Source: Ambulatory Visit | Attending: Internal Medicine | Admitting: Internal Medicine

## 2017-02-15 DIAGNOSIS — R6889 Other general symptoms and signs: Secondary | ICD-10-CM | POA: Diagnosis not present

## 2017-02-15 DIAGNOSIS — J849 Interstitial pulmonary disease, unspecified: Secondary | ICD-10-CM

## 2017-02-22 DIAGNOSIS — M25512 Pain in left shoulder: Secondary | ICD-10-CM | POA: Diagnosis not present

## 2017-02-22 DIAGNOSIS — J849 Interstitial pulmonary disease, unspecified: Secondary | ICD-10-CM | POA: Diagnosis not present

## 2017-02-22 DIAGNOSIS — M3219 Other organ or system involvement in systemic lupus erythematosus: Secondary | ICD-10-CM | POA: Diagnosis not present

## 2017-02-22 DIAGNOSIS — Z6824 Body mass index (BMI) 24.0-24.9, adult: Secondary | ICD-10-CM | POA: Diagnosis not present

## 2017-02-22 DIAGNOSIS — R6889 Other general symptoms and signs: Secondary | ICD-10-CM | POA: Diagnosis not present

## 2017-02-22 DIAGNOSIS — Z79899 Other long term (current) drug therapy: Secondary | ICD-10-CM | POA: Diagnosis not present

## 2017-02-22 DIAGNOSIS — M65331 Trigger finger, right middle finger: Secondary | ICD-10-CM | POA: Diagnosis not present

## 2017-02-23 ENCOUNTER — Telehealth: Payer: Self-pay | Admitting: Internal Medicine

## 2017-02-23 NOTE — Telephone Encounter (Signed)
[name]  1/.,my last ov note said to do HRCT in 3 months but they did it in 2-3 weeks/ Any idea why?  2. I tried callign Dr Trudie Reed rheum. Can you leave a message in their office for Dr Gavin Pound to call me anytime on my cell?   3. Ensure fu in 3 months with Pre-bd spiro and dlco only. No lung volume or bd response. No post-bd spiro

## 2017-02-23 NOTE — Telephone Encounter (Signed)
Order for the HRCT was placed by Salome Arnt, CMA so I am not sure why it was ordered for pt to have it in 2-3 weeks instead of 3 months.  I will go ahead and send a message to Dr. Trudie Reed for her to call you regarding pt.  There is a reminder on pt's chart for her to be called to have a 67month follow up visit with you.  I will make sure it also says for pt to be scheduled for the pre-bd spiro and dlco.

## 2017-02-24 NOTE — Telephone Encounter (Signed)
Thanks,. Please get her in ILD clinic at followup

## 2017-02-24 NOTE — Telephone Encounter (Signed)
Attempted to call pt to see if I could schedule her 43-month follow up visit and 25min PFT for pt in April, but pt did not answer.  I am looking at April 23,2019 as the day to get pt scheduled with MR in the ILD clinic and was trying to see if I could get pt to come in at 1:00 for the 41min PFT and have the OV with MR at 1:30, or to see if pt could come in at 2:00 for the 5min PFT and have the OV with MR at 2:30, or have pt come in at 3:00 for the 79min PFT and have the OV with MR at 3:30.  Left message for pt to return our call so we can get her scheduled for both the 64min PFT and the OV with MR in the ILD clinic.  There is a recall in pt's chart regarding the follow up appt.  See the options above for [place] to get pt scheduled.

## 2017-02-24 NOTE — Telephone Encounter (Signed)
See previous phone note from 02/23/17. Will close this encounter.

## 2017-03-03 DIAGNOSIS — I10 Essential (primary) hypertension: Secondary | ICD-10-CM | POA: Diagnosis not present

## 2017-03-03 DIAGNOSIS — E119 Type 2 diabetes mellitus without complications: Secondary | ICD-10-CM | POA: Diagnosis not present

## 2017-03-04 NOTE — Telephone Encounter (Signed)
Called and left message for her to call back for PFT/MR appt 05/18/17. Will try to call back at a later time.

## 2017-03-04 NOTE — Telephone Encounter (Signed)
Pt was called and appts were scheduled for pt.  Message being closed.

## 2017-03-16 DIAGNOSIS — H11003 Unspecified pterygium of eye, bilateral: Secondary | ICD-10-CM | POA: Diagnosis not present

## 2017-03-16 DIAGNOSIS — H1045 Other chronic allergic conjunctivitis: Secondary | ICD-10-CM | POA: Diagnosis not present

## 2017-03-16 DIAGNOSIS — E119 Type 2 diabetes mellitus without complications: Secondary | ICD-10-CM | POA: Diagnosis not present

## 2017-03-16 DIAGNOSIS — H5203 Hypermetropia, bilateral: Secondary | ICD-10-CM | POA: Diagnosis not present

## 2017-03-26 DIAGNOSIS — Z0100 Encounter for examination of eyes and vision without abnormal findings: Secondary | ICD-10-CM | POA: Diagnosis not present

## 2017-03-31 DIAGNOSIS — E119 Type 2 diabetes mellitus without complications: Secondary | ICD-10-CM | POA: Diagnosis not present

## 2017-03-31 DIAGNOSIS — I10 Essential (primary) hypertension: Secondary | ICD-10-CM | POA: Diagnosis not present

## 2017-05-05 DIAGNOSIS — M329 Systemic lupus erythematosus, unspecified: Secondary | ICD-10-CM | POA: Diagnosis not present

## 2017-05-05 DIAGNOSIS — E119 Type 2 diabetes mellitus without complications: Secondary | ICD-10-CM | POA: Diagnosis not present

## 2017-05-12 DIAGNOSIS — E039 Hypothyroidism, unspecified: Secondary | ICD-10-CM | POA: Diagnosis not present

## 2017-05-12 DIAGNOSIS — N289 Disorder of kidney and ureter, unspecified: Secondary | ICD-10-CM | POA: Diagnosis not present

## 2017-05-12 DIAGNOSIS — R6889 Other general symptoms and signs: Secondary | ICD-10-CM | POA: Diagnosis not present

## 2017-05-12 DIAGNOSIS — M329 Systemic lupus erythematosus, unspecified: Secondary | ICD-10-CM | POA: Diagnosis not present

## 2017-05-12 DIAGNOSIS — M545 Low back pain: Secondary | ICD-10-CM | POA: Diagnosis not present

## 2017-05-12 DIAGNOSIS — J301 Allergic rhinitis due to pollen: Secondary | ICD-10-CM | POA: Diagnosis not present

## 2017-05-12 DIAGNOSIS — I10 Essential (primary) hypertension: Secondary | ICD-10-CM | POA: Diagnosis not present

## 2017-05-12 DIAGNOSIS — G629 Polyneuropathy, unspecified: Secondary | ICD-10-CM | POA: Diagnosis not present

## 2017-05-12 DIAGNOSIS — J45909 Unspecified asthma, uncomplicated: Secondary | ICD-10-CM | POA: Diagnosis not present

## 2017-05-12 DIAGNOSIS — E119 Type 2 diabetes mellitus without complications: Secondary | ICD-10-CM | POA: Diagnosis not present

## 2017-05-17 ENCOUNTER — Other Ambulatory Visit: Payer: Self-pay | Admitting: Internal Medicine

## 2017-05-17 DIAGNOSIS — R0600 Dyspnea, unspecified: Secondary | ICD-10-CM

## 2017-05-18 ENCOUNTER — Ambulatory Visit (INDEPENDENT_AMBULATORY_CARE_PROVIDER_SITE_OTHER): Payer: Medicare HMO | Admitting: Internal Medicine

## 2017-05-18 ENCOUNTER — Encounter: Payer: Self-pay | Admitting: Internal Medicine

## 2017-05-18 VITALS — BP 118/70 | HR 81 | Ht 66.0 in | Wt 157.8 lb

## 2017-05-18 DIAGNOSIS — R6889 Other general symptoms and signs: Secondary | ICD-10-CM | POA: Diagnosis not present

## 2017-05-18 DIAGNOSIS — J849 Interstitial pulmonary disease, unspecified: Secondary | ICD-10-CM | POA: Diagnosis not present

## 2017-05-18 DIAGNOSIS — M359 Systemic involvement of connective tissue, unspecified: Secondary | ICD-10-CM

## 2017-05-18 DIAGNOSIS — J439 Emphysema, unspecified: Secondary | ICD-10-CM

## 2017-05-18 DIAGNOSIS — R0600 Dyspnea, unspecified: Secondary | ICD-10-CM

## 2017-05-18 MED ORDER — TIOTROPIUM BROMIDE MONOHYDRATE 1.25 MCG/ACT IN AERS: 2.0000 | INHALATION_SPRAY | Freq: Every day | RESPIRATORY_TRACT | 0 refills | Status: DC

## 2017-05-18 NOTE — Progress Notes (Signed)
PFT was attempted today but could not be completed. Pt would not follow instructions and gave a very poor effort.

## 2017-05-18 NOTE — Patient Instructions (Addendum)
ICD-10-CM   1. ILD (interstitial lung disease) ([place]) J84.9   2. 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 Pulmonary emphysema, unspecified emphysema type ([place]) J43.9     You have pulmonary fibrosis THis is due to your autoimmune antibodies This disease can unpredictably get worse but currently stable  In addition you have mild emphysema on CT chest  PLAN In an effort to slow it down sometimes strong medications eg immuran or cellcept are indicated and I recommend this  We discussed this but respect at this point you are unwilling to take this risk on with side effect profile  YOu can try spiriva sample to see if this helps your shortness of breath - due to description of emphysema on CT   Followup 3 months do Pre-bd spiro and dlco only. No lung volume or bd response. No post-bd spiro Return after 3 months after PFT - return to ILD clinic

## 2017-05-18 NOTE — Progress Notes (Signed)
Subjective:     Patient ID: [name], female   DOB: January 15, 1941, 77 y.o.   MRN: [id number]   PCP Benito Mccreedy, MD  HPI  IOV 07/16/2016  Chief Complaint  Patient presents with  . Shortness of Breath    consult for sob referring doctor is Dr. Einar Gip patient states that she is very sob and using the inhaler helps her very much     According to the referral chart from Dr. Einar Gip, he'll originally saw her on 06/26/2016 for shortness of breath at follow-up. Patient reported to him that she's had insidious onset of shortness of breath for several years with progression in the last several months associated with bilateral chest tightness. Apparently patient was given a diagnosis of asthma and bronchodilator use does help her. Per the patient history spirometry testing outside was abnormal. Dyspnea is significant enough patient not able to do her household work. There is associated fatigue. A trial of diuresis by the primary care physician did help the shortness of breath. Patient's symptoms are associated with lisinopril intake for hypertension.  exhaled nitric oxide test today in the office is 19 ppb and normal.  IgE. ''Walking desaturation test on 07/16/2016 185 feet x 3 laps on room air forehead probe:  did NOT desaturate. Rest pulse ox was 100%, final pulse ox was 100%. HR response 75/min at rest to 85/min at peak exertion. 2-D echocardiogram 02/21/2016 as reviewed from the outside chart shows mild concentric left ventricular remodeling with ejection fraction 79. Normal right ventricle size and contractility. No evidence of diastolic dysfunction. No pericardial effusion    In talking to the patient and her daughter [name] the above information was confirmed. In addition she is on Advair discus for the last few months because of the above problems. It is helping her. She takes it as needed which ends up being every other day once. Today the exhaled nitric oxide was normal 16 ppb which is  essentially on Advair. She is also on lisinopril for the last few years. She does have associated mild cough. Evaluation by cardiology did show crackles in the concern is he has interstitial lung disease therefore she is referred here. Associated with concern of interstitial lung disease is a history of lupus that has been present for many years. When she relocated to [facility] few years ago with a primary care physician has been following this. Patient is on plaquenil but not steroids The family is unsure about SLE blood work  Results for [name], [name] (MRN [id number]) as of 07/16/2016 10:45  Ref. Range 01/05/2015 09:31 01/12/2015 16:07 01/12/2015 19:45 01/12/2015 20:00 01/12/2015 20:02  Hemoglobin Latest Ref Range: 12.0 - 15.0 g/dL 10.4 (L) 11.9 (L)        OV 07/30/2016  Chief Complaint  Patient presents with  . Follow-up    Pt was here for a PFT but was unable to complete it. Pt continues to be SOB with some improvement, occ. dry cough.   Here to follow-up for pulmonary test results for interstitial lung disease workup. In terms of test results her ESR is high ANA is positive and her Sjogren's antibodies positive but lupus antibodies and rheumatoid factor antibodies are negative. Her CT scan does show interstitial lung disease at the base. She could not complete pulmonary function test due to inability to follow direction but I suspect the disease is mild because of walking desaturation test last visit was mild. Her daughter and she appeared to understand these test results. She reports dyspnea  and cough. She is on ACE inhibitor.    OV 02/02/2017  Chief Complaint  Patient presents with  . Follow-up    Pt states she  has been doing good since last visit. Denies any complaints of cough, SOB, or CP.   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  In the interim she has no complaints.  She only has mild dyspnea on  exertion.  Walking desaturation test on 185 feet x3 laps on room air.  Resting pulse ox 100%.  Final pulse ox 97%.  Resting heart rate was 70/min.  Final heart rate was 87/min.  She did see a rheumatologist Dr. Gavin Pound in practice last month.  I reviewed the note.  It appears that they are not finding any other systemic evidence of autoimmune disease.  Therefore she is still maintained on Plaquenil.  According to the notes patient is also Smith and RNP antibody positive.  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  Most recent CT scan of the chest in summer 2018 as a possible/probable UIP.  Results for [name], [name] (MRN [id number]) as of 07/30/2016 10:00  Ref. Range 07/16/2016 12:09 07/16/2016 12:18  Nitric Oxide Unknown  16  Sed Rate Latest Ref Range: 0 - 30 mm/hr 77 (H)   Anit Nuclear Antibody(ANA) Latest Ref Range: NEGATIVE  POS (A)   ANA Pattern 1 Unknown NUCLEOLAR (A)   ANA Titer 1 Latest Units: titer 4:098 (H)   Cyclic Citrullin Peptide Ab Latest Units: Units <16   ds DNA Ab Latest Units: IU/mL 1   RA Latex Turbid. Latest Ref Range: <14 IU/mL <14   SSA (Ro) (ENA) Antibody, IgG Latest Ref Range: <1.0 NEG AI  >8.0 POS (H)   SSB (La) (ENA) Antibody, IgG Latest Ref Range: <1.0 NEG AI  <1.0 NEG   Scleroderma (Scl-70) (ENA) Antibody, IgG Latest Ref Range: <1.0 NEG AI  <1.0 NEG     IMPRESSION: CT CHEST HIGH RESOLUTION 1. 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 Differential includes fibrotic nonspecific interstitial pneumonia (NSIP). Follow-up high-resolution chest CT in 12 months would be useful to assess temporal pattern stability, as clinically warranted. 2. Solitary 3 mm solid right upper lobe pulmonary nodule. No follow-up needed if patient is low-risk. Non-contrast chest CT can be considered in 12 months if patient is  high-risk. This recommendation follows the consensus statement: Guidelines for Management of Incidental Pulmonary Nodules Detected on CT Images: From the Fleischner Society 2017; Radiology 2017; 284:228-243. 3. Left main and 3 vessel coronary atherosclerosis. 4. Small hiatal hernia. 5. Diffuse hepatic steatosis.  Aortic Atherosclerosis (ICD10-I70.0) and Emphysema (ICD10-J43.9).   Electronically Signed   By: [name] M.D.   On: 07/22/2016 14:17  OV 05/18/2017  Chief Complaint  Patient presents with  . Follow-up    PFT attempted today but pt was unable to complete as per Ria Comment pt did not want to do the PFT.  HRCT done 1/21.  Pt states she has been doing well. Breathing is about the same as last visit. Denies any complaints or concerns.    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  In the interim overall she is stable.  She is an extremely poor historian.  After asking her multiple times in different ways she believes earlier this year she  had a cardiac stress test for a coronary artery calcification by Dr. Einar Gip and this was normal.  She is completely unaware of the fact that she has interstitial lung disease.  She believes she has asthma but when I questioned her and directed her that I have explained to her that she has pulmonary fibrosis she then admitted to the in fact she understands that she has pulmonary fibrosis.  Overall she is stable she has mild dyspnea on exertion without any cough.  She thinks she had lupus diagnosed by a rheumatologist in [facility] but when I told her that there is no systemic evidence of autoimmune disease when she saw [facility] rheumatology she seems surprised that she even made that visit.  There are no new issues.   Walking desaturation test on 05/18/2017 185 feet x 3 laps on ROOM AIR:  did not have symptioms or  desaturate. Rest pulse ox was 100%, final pulse ox was 95%.  HR response 69/min at rest to 90/min at peak exertion. Patient Keyana Guevara  Did not Desaturate < 88% . Deatra James yes did  Desaturated </= 3% points. Deatra James yes did get tachyardic   IMPRESSION: 1. No appreciable interval progression of basilar predominant fibrotic interstitial lung disease with mild honeycombing in the intervening 6 months. Findings are still considered to represent probable usual interstitial pneumonia (UIP), with the differential including fibrotic phase nonspecific interstitial pneumonia (NSIP). Follow-up high-resolution chest CT recommended in 12 months to assess ongoing temporal pattern stability. 2. Tiny right upper lobe pulmonary nodule is stable for 6 months and probably benign. 3. Left main and 3 vessel coronary atherosclerosis. 4. Small hiatal hernia.  Aortic Atherosclerosis (ICD10-I70.0).   Electronically Signed   By: [name] M.D.   On: 02/15/2017 16:05   has a past medical history of Breast cancer ([place]), Diabetes mellitus without complication ([place]), Hypertension, Hypothyroidism, and Lupus ([place]).   reports that she has never smoked. She quit smokeless tobacco use about 2 years ago. Her smokeless tobacco use included snuff.  Past Surgical History:  Procedure Laterality Date  . BREAST LUMPECTOMY  2009    Allergies  Allergen Reactions  . Ciprofloxacin Itching, Swelling and Other (See Comments)    Possibly causing tremors?    Immunization History  Administered Date(s) Administered  . Influenza, High Dose Seasonal PF 11/09/2016  . Influenza,inj,Quad PF,6+ Mos 10/27/2015    Family History  Problem Relation Age of Onset  . Cancer Father        ? type     Current Outpatient Medications:  .  ADVAIR DISKUS 100-50 MCG/DOSE AEPB, Inhale 2 puffs into the lungs daily., Disp: , Rfl: 1 .  gabapentin (NEURONTIN) 300 MG capsule, Take 300 mg by mouth 2 (two) times daily., Disp: , Rfl:  .  glimepiride (AMARYL) 1 MG tablet, Take 1 mg by  mouth daily., Disp: , Rfl: 3 .  hydroxychloroquine (PLAQUENIL) 200 MG tablet, Take 1 tablet (200 mg total) by mouth daily., Disp: 30 tablet, Rfl: 0 .  levothyroxine (SYNTHROID, LEVOTHROID) 50 MCG tablet, Take 50 mcg by mouth daily before breakfast., Disp: , Rfl:  .  losartan-hydrochlorothiazide (HYZAAR) 50-12.5 MG tablet, TK 1 T PO  ONCE A DAY, Disp: , Rfl: 1 .  Multiple Vitamin (MULTIVITAMIN WITH MINERALS) TABS tablet, Take 1 tablet by mouth daily., Disp: , Rfl:  .  omeprazole (PRILOSEC) 20 MG capsule, Take 20 mg by mouth daily., Disp: , Rfl:    Review of Systems  Objective:   Physical Exam Vitals:   05/18/17 1334  BP: 118/70  Pulse: 81  SpO2: 98%  Weight: 157 lb 12.8 oz (71.6 kg)  Height: _0  (1.676 m)    Estimated body mass index is 25.47 kg/m as calculated from the following:   Height as of this encounter: _1  (1.676 m).   Weight as of this encounter: 157 lb 12.8 oz (71.6 kg).     Assessment:       ICD-10-CM   1. ILD (interstitial lung disease) ([place]) J84.9   2. 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 Pulmonary emphysema, unspecified emphysema type ([place]) J43.9        Plan:       You have pulmonary fibrosis THis is due to your autoimmune antibodies This disease can unpredictably get worse but currently stable  In addition you have mild emphysema on CT chest  PLAN In an effort to slow it down sometimes strong medications eg immuran or cellcept are indicated and I recommend this  We discussed this but respect at this point you are unwilling to take this risk on with side effect profile  YOu can try spiriva sample to see if this helps your shortness of breath - due to description of emphysema on CT   Followup 3 months do Pre-bd spiro and dlco only. No lung volume or bd response. No post-bd spiro Return after 3 months after PFT - return to ILD clinic     > 50% of this > 25 min visit spent in face to face counseling or  coordination of care    Dr. Brand Males, M.D., [facility].C.P Pulmonary and Critical Care Medicine Staff Physician, [place] Director - Interstitial Lung Disease  Program  [facility] [place], [place], [address]  Pager: [phone], If no answer or between  15:00h - 7:00h: call [phone] Telephone: [phone]

## 2017-06-03 DIAGNOSIS — J301 Allergic rhinitis due to pollen: Secondary | ICD-10-CM | POA: Diagnosis not present

## 2017-06-03 DIAGNOSIS — M329 Systemic lupus erythematosus, unspecified: Secondary | ICD-10-CM | POA: Diagnosis not present

## 2017-06-03 DIAGNOSIS — M545 Low back pain: Secondary | ICD-10-CM | POA: Diagnosis not present

## 2017-06-03 DIAGNOSIS — R6889 Other general symptoms and signs: Secondary | ICD-10-CM | POA: Diagnosis not present

## 2017-06-03 DIAGNOSIS — Z0000 Encounter for general adult medical examination without abnormal findings: Secondary | ICD-10-CM | POA: Diagnosis not present

## 2017-06-03 DIAGNOSIS — G629 Polyneuropathy, unspecified: Secondary | ICD-10-CM | POA: Diagnosis not present

## 2017-06-03 DIAGNOSIS — E119 Type 2 diabetes mellitus without complications: Secondary | ICD-10-CM | POA: Diagnosis not present

## 2017-06-03 DIAGNOSIS — I10 Essential (primary) hypertension: Secondary | ICD-10-CM | POA: Diagnosis not present

## 2017-06-03 DIAGNOSIS — E039 Hypothyroidism, unspecified: Secondary | ICD-10-CM | POA: Diagnosis not present

## 2017-06-03 DIAGNOSIS — J45909 Unspecified asthma, uncomplicated: Secondary | ICD-10-CM | POA: Diagnosis not present

## 2017-06-08 ENCOUNTER — Telehealth: Payer: Self-pay | Admitting: Internal Medicine

## 2017-06-08 MED ORDER — TIOTROPIUM BROMIDE MONOHYDRATE 1.25 MCG/ACT IN AERS: 2.0000 | INHALATION_SPRAY | Freq: Every day | RESPIRATORY_TRACT | 5 refills | Status: DC

## 2017-06-08 NOTE — Telephone Encounter (Signed)
ICD-10-CM   1.   ILD (interstitial lung disease) ([place]) J84.9   2. 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 Pulmonary emphysema, unspecified emphysema type ([place]) J43.9     You have pulmonary fibrosis THis is due to your autoimmune antibodies This disease can unpredictably get worse but currently stable  In addition you have mild emphysema on CT chest  PLAN In an effort to slow it down sometimes strong medications eg immuran or cellcept are indicated and I recommend this  We discussed this but respect at this point you are unwilling to take this risk on with side effect profile  YOu can try spiriva sample to see if this helps your shortness of breath - due to description of emphysema on CT   Followup 3 months do Pre-bd spiro and dlco only. No lung volume or bd response. No post-bd spiro Return after 3 months after PFT - return to ILD clinic   .Spoke with pt and advised rx sent to pharmacy. Nothing further is needed.

## 2017-06-09 DIAGNOSIS — E119 Type 2 diabetes mellitus without complications: Secondary | ICD-10-CM | POA: Diagnosis not present

## 2017-06-09 DIAGNOSIS — I10 Essential (primary) hypertension: Secondary | ICD-10-CM | POA: Diagnosis not present

## 2017-07-25 LAB — PULMONARY FUNCTION TEST
FEF 25-75 Pre: 1.63 L/sec
FEF2575-%Pred-Pre: 94 %
FEV1-%Pred-Pre: 76 %
FEV1-PRE: 1.51 L
FEV1FVC-%Pred-Pre: 122 %
FEV6-%PRED-PRE: 65 %
FEV6-Pre: 1.61 L
FEV6FVC-%PRED-PRE: 104 %
FVC-%Pred-Pre: 63 %
FVC-PRE: 1.61 L
PRE FEV1/FVC RATIO: 93 %
Pre FEV6/FVC Ratio: 100 %

## 2017-08-02 DIAGNOSIS — I10 Essential (primary) hypertension: Secondary | ICD-10-CM | POA: Diagnosis not present

## 2017-08-02 DIAGNOSIS — J45909 Unspecified asthma, uncomplicated: Secondary | ICD-10-CM | POA: Diagnosis not present

## 2017-08-02 DIAGNOSIS — E039 Hypothyroidism, unspecified: Secondary | ICD-10-CM | POA: Diagnosis not present

## 2017-08-02 DIAGNOSIS — R6889 Other general symptoms and signs: Secondary | ICD-10-CM | POA: Diagnosis not present

## 2017-08-02 DIAGNOSIS — J301 Allergic rhinitis due to pollen: Secondary | ICD-10-CM | POA: Diagnosis not present

## 2017-08-02 DIAGNOSIS — G629 Polyneuropathy, unspecified: Secondary | ICD-10-CM | POA: Diagnosis not present

## 2017-08-02 DIAGNOSIS — M329 Systemic lupus erythematosus, unspecified: Secondary | ICD-10-CM | POA: Diagnosis not present

## 2017-08-02 DIAGNOSIS — N3000 Acute cystitis without hematuria: Secondary | ICD-10-CM | POA: Diagnosis not present

## 2017-08-02 DIAGNOSIS — M545 Low back pain: Secondary | ICD-10-CM | POA: Diagnosis not present

## 2017-08-02 DIAGNOSIS — N289 Disorder of kidney and ureter, unspecified: Secondary | ICD-10-CM | POA: Diagnosis not present

## 2017-08-02 DIAGNOSIS — E119 Type 2 diabetes mellitus without complications: Secondary | ICD-10-CM | POA: Diagnosis not present

## 2017-08-12 DIAGNOSIS — R6889 Other general symptoms and signs: Secondary | ICD-10-CM | POA: Diagnosis not present

## 2017-08-17 DIAGNOSIS — M3219 Other organ or system involvement in systemic lupus erythematosus: Secondary | ICD-10-CM | POA: Diagnosis not present

## 2017-08-17 DIAGNOSIS — M65331 Trigger finger, right middle finger: Secondary | ICD-10-CM | POA: Diagnosis not present

## 2017-08-17 DIAGNOSIS — Z79899 Other long term (current) drug therapy: Secondary | ICD-10-CM | POA: Diagnosis not present

## 2017-08-17 DIAGNOSIS — R6889 Other general symptoms and signs: Secondary | ICD-10-CM | POA: Diagnosis not present

## 2017-08-17 DIAGNOSIS — M25512 Pain in left shoulder: Secondary | ICD-10-CM | POA: Diagnosis not present

## 2017-08-17 DIAGNOSIS — Z6825 Body mass index (BMI) 25.0-25.9, adult: Secondary | ICD-10-CM | POA: Diagnosis not present

## 2017-08-17 DIAGNOSIS — E663 Overweight: Secondary | ICD-10-CM | POA: Diagnosis not present

## 2017-08-17 DIAGNOSIS — J849 Interstitial pulmonary disease, unspecified: Secondary | ICD-10-CM | POA: Diagnosis not present

## 2017-08-18 ENCOUNTER — Ambulatory Visit: Payer: Medicare HMO | Admitting: Internal Medicine

## 2017-08-18 ENCOUNTER — Ambulatory Visit (INDEPENDENT_AMBULATORY_CARE_PROVIDER_SITE_OTHER): Payer: Medicare HMO | Admitting: Internal Medicine

## 2017-08-18 ENCOUNTER — Encounter: Payer: Self-pay | Admitting: Internal Medicine

## 2017-08-18 VITALS — BP 120/64 | HR 66 | Ht 66.0 in | Wt 164.0 lb

## 2017-08-18 DIAGNOSIS — M359 Systemic involvement of connective tissue, unspecified: Secondary | ICD-10-CM

## 2017-08-18 DIAGNOSIS — R6889 Other general symptoms and signs: Secondary | ICD-10-CM | POA: Diagnosis not present

## 2017-08-18 DIAGNOSIS — J849 Interstitial pulmonary disease, unspecified: Secondary | ICD-10-CM

## 2017-08-18 DIAGNOSIS — J439 Emphysema, unspecified: Secondary | ICD-10-CM

## 2017-08-18 MED ORDER — TIOTROPIUM BROMIDE MONOHYDRATE 1.25 MCG/ACT IN AERS: 2.0000 | INHALATION_SPRAY | Freq: Every day | RESPIRATORY_TRACT | 5 refills | Status: DC

## 2017-08-18 NOTE — Progress Notes (Addendum)
Subjective:     Patient ID: [name], female   DOB: 1940-11-14, 77 y.o.   MRN: [id number]  HPI  PCP Kathryn Mccreedy, MD  HPI  IOV 07/16/2016  Chief Complaint  Patient presents with  . Shortness of Breath    consult for sob referring doctor is Dr. Einar [name] patient states that she is very sob and using the inhaler helps her very much     According to the referral chart from Dr. Einar [name], he'll originally saw her on 06/26/2016 for shortness of breath at follow-up. Patient reported to him that she's had insidious onset of shortness of breath for several years with progression in the last several months associated with bilateral chest tightness. Apparently patient was given a diagnosis of asthma and bronchodilator use does help her. Per the patient history spirometry testing outside was abnormal. Dyspnea is significant enough patient not able to do her household work. There is associated fatigue. A trial of diuresis by the primary care physician did help the shortness of breath. Patient's symptoms are associated with lisinopril intake for hypertension.  exhaled nitric oxide test today in the office is 19 ppb and normal.  IgE. ''Walking desaturation test on 07/16/2016 185 feet x 3 laps on room air forehead probe:  did NOT desaturate. Rest pulse ox was 100%, final pulse ox was 100%. HR response 75/min at rest to 85/min at peak exertion. 2-D echocardiogram 02/21/2016 as reviewed from the outside chart shows mild concentric left ventricular remodeling with ejection fraction 79. Normal right ventricle size and contractility. No evidence of diastolic dysfunction. No pericardial effusion    In talking to the patient and her daughter [name] the above information was confirmed. In addition she is on Advair discus for the last few months because of the above problems. It is helping her. She takes it as needed which ends up being every other day once. Today the exhaled nitric oxide was normal 16 ppb which is  essentially on Advair. She is also on lisinopril for the last few years. She does have associated mild cough. Evaluation by cardiology did show crackles in the concern is he has interstitial lung disease therefore she is referred here. Associated with concern of interstitial lung disease is a history of lupus that has been present for many years. When she relocated to [facility] few years ago with a primary care physician has been following this. Patient is on plaquenil but not steroids The family is unsure about SLE blood work  Results for [name], [name] (MRN [id number]) as of 07/16/2016 10:45  Ref. Range 01/05/2015 09:31 01/12/2015 16:07 01/12/2015 19:45 01/12/2015 20:00 01/12/2015 20:02  Hemoglobin Latest Ref Range: 12.0 - 15.0 g/dL 10.4 (L) 11.9 (L)        OV 07/30/2016  Chief Complaint  Patient presents with  . Follow-up    Pt was here for a PFT but was unable to complete it. Pt continues to be SOB with some improvement, occ. dry cough.   Here to follow-up for pulmonary test results for interstitial lung disease workup. In terms of test results her ESR is high ANA is positive and her Sjogren's antibodies positive but lupus antibodies and rheumatoid factor antibodies are negative. Her CT scan does show interstitial lung disease at the base. She could not complete pulmonary function test due to inability to follow direction but I suspect the disease is mild because of walking desaturation test last visit was mild. Her daughter and she appeared to understand these test results. She reports  dyspnea and cough. She is on ACE inhibitor.    OV 02/02/2017  Chief Complaint  Patient presents with  . Follow-up    Pt states she  has been doing good since last visit. Denies any complaints of cough, SOB, or CP.   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  In the interim she has no complaints.  She only has mild dyspnea on  exertion.  Walking desaturation test on 185 feet x3 laps on room air.  Resting pulse ox 100%.  Final pulse ox 97%.  Resting heart rate was 70/min.  Final heart rate was 87/min.  She did see a rheumatologist Dr. Gavin [name] in practice last month.  I reviewed the note.  It appears that they are not finding any other systemic evidence of autoimmune disease.  Therefore she is still maintained on Plaquenil.  According to the notes patient is also Smith and RNP antibody positive.  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  Most recent CT scan of the chest in summer 2018 as a possible/probable UIP.  Results for Kathryn, [name] (MRN [id number]) as of 07/30/2016 10:00  Ref. Range 07/16/2016 12:09 07/16/2016 12:18  Nitric Oxide Unknown  16  Sed Rate Latest Ref Range: 0 - 30 mm/hr 77 (H)   Anit Nuclear Antibody(ANA) Latest Ref Range: NEGATIVE  POS (A)   ANA Pattern 1 Unknown NUCLEOLAR (A)   ANA Titer 1 Latest Units: titer 2:800 (H)   Cyclic Citrullin Peptide Ab Latest Units: Units <16   ds DNA Ab Latest Units: IU/mL 1   RA Latex Turbid. Latest Ref Range: <14 IU/mL <14   SSA (Ro) (ENA) Antibody, IgG Latest Ref Range: <1.0 NEG AI  >8.0 POS (H)   SSB (La) (ENA) Antibody, IgG Latest Ref Range: <1.0 NEG AI  <1.0 NEG   Scleroderma (Scl-70) (ENA) Antibody, IgG Latest Ref Range: <1.0 NEG AI  <1.0 NEG     IMPRESSION: CT CHEST HIGH RESOLUTION 1. 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 Differential includes fibrotic nonspecific interstitial pneumonia (NSIP). Follow-up high-resolution chest CT in 12 months would be useful to assess temporal pattern stability, as clinically warranted. 2. Solitary 3 mm solid right upper lobe pulmonary nodule. No follow-up needed if patient is low-risk. Non-contrast chest CT can be considered in 12 months if patient is  high-risk. This recommendation follows the consensus statement: Guidelines for Management of Incidental Pulmonary Nodules Detected on CT Images: From the Fleischner Society 2017; Radiology 2017; 284:228-243. 3. Left main and 3 vessel coronary atherosclerosis. 4. Small hiatal hernia. 5. Diffuse hepatic steatosis.  Aortic Atherosclerosis (ICD10-I70.0) and Emphysema (ICD10-J43.9).   Electronically Signed   By: [name] M.D.   On: 07/22/2016 14:17  OV 05/18/2017  Chief Complaint  Patient presents with  . Follow-up    PFT attempted today but pt was unable to complete as per [name] pt did not want to do the PFT.  HRCT done 1/21.  Pt states she has been doing well. Breathing is about the same as last visit. Denies any complaints or concerns.    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  In the interim overall she is stable.  She is an extremely poor historian.  After asking her multiple times in different ways she believes earlier this year  she had a cardiac stress test for a coronary artery calcification by Dr. Einar [name] and this was normal.  She is completely unaware of the fact that she has interstitial lung disease.  She believes she has asthma but when I questioned her and directed her that I have explained to her that she has pulmonary fibrosis she then admitted to the in fact she understands that she has pulmonary fibrosis.  Overall she is stable she has mild dyspnea on exertion without any cough.  She thinks she had lupus diagnosed by a rheumatologist in [facility] but when I told her that there is no systemic evidence of autoimmune disease when she saw [facility] rheumatology she seems surprised that she even made that visit.  There are no new issues.   Walking desaturation test on 05/18/2017 185 feet x 3 laps on ROOM AIR:  did not have symptioms or  desaturate. Rest pulse ox was 100%, final pulse ox was 95%.  HR response 69/min at rest to 90/min at peak exertion. Patient [name]  Did not Desaturate < 88% . [name] yes did  Desaturated </= 3% points. [name] yes did get tachyardic   IMPRESSION: Lungs/Pleura: No pneumothorax. No pleural effusion. Posterior right upper lobe 3 mm solid pulmonary nodule (series 5/image 39), stable since 07/22/2016 chest CT. No acute consolidative airspace disease, lung masses or new significant pulmonary nodules. No significant air trapping on the expiration sequence. There is patchy confluent subpleural reticulation and ground-glass attenuation in both lungs with associated mild traction bronchiectasis and minimal architectural distortion. There is mild honeycombing throughout both lung bases. Findings have not convincingly progressed since 07/22/2016 chest CT. 1. No appreciable interval progression of basilar predominant fibrotic interstitial lung disease with mild honeycombing in the intervening 6 months. Findings are still considered to represent probable usual interstitial pneumonia (UIP), with the differential including fibrotic phase nonspecific interstitial pneumonia (NSIP). Follow-up high-resolution chest CT recommended in 12 months to assess ongoing temporal pattern stability. 2. Tiny right upper lobe pulmonary nodule is stable for 6 months and probably benign. 3. Left main and 3 vessel coronary atherosclerosis. 4. Small hiatal hernia.  Aortic Atherosclerosis (ICD10-I70.0).   Electronically Signed   By: [name] M.D.   On: 02/15/2017 16:05   OV 08/18/2017  Chief Complaint  Patient presents with  . Follow-up    PFT attempted today but pt was unable to complete.  Pt states she is about the same since last visit. Denies any real complaints of SOB and denies any real complaints.    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 Immunomodulators of probably due to toxic follow-up. Moreover her ILD appears stable over time. Therefore we decided to treat her him from Spiriva. She is here for follow-up she tells me she is feeling better. She likes her Spiriva. This is actually helping her shortness of breath. Walking desaturation test is essentially normal. She had a pulmonary function test today but she could not follow through with instructions. There are no other new issues. She denies any ER visits    Simple office walk 185 feet x  3 laps goal with forehead probe 08/18/2017     O2 used *room air  Number laps completed 3  Comments about pace Normal pace  Resting Pulse Ox/HR 100% and 67/min  Final Pulse Ox/HR 100% and 91/min  Desaturated </=  88% no  Desaturated <= 3% points no  Got Tachycardic >/= 90/min yes  Symptoms at end of test Mild dyspnea  Miscellaneous comments x        has a past medical history of Breast cancer ([place]), Diabetes mellitus without complication ([place]), Hypertension, Hypothyroidism, and Lupus ([place]).   reports that she has never smoked. She quit smokeless tobacco use about 2 years ago. Her smokeless tobacco use included snuff.  Past Surgical History:  Procedure Laterality Date  . BREAST LUMPECTOMY  2009    Allergies  Allergen Reactions  . Ciprofloxacin Itching, Swelling and Other (See Comments)    Possibly causing tremors?    Immunization History  Administered Date(s) Administered  . Influenza, High Dose Seasonal PF 11/09/2016  . Influenza,inj,Quad PF,6+ Mos 10/27/2015    Family History  Problem Relation Age of Onset  . Cancer Father        ? type     Current Outpatient Medications:  .  ADVAIR DISKUS 100-50 MCG/DOSE AEPB, Inhale 2 puffs into the lungs daily., Disp: , Rfl: 1 .  gabapentin (NEURONTIN) 300 MG capsule, Take 300 mg by mouth 2 (two) times daily., Disp: , Rfl:  .  glimepiride (AMARYL) 1 MG tablet, Take 1 mg by  mouth daily., Disp: , Rfl: 3 .  hydroxychloroquine (PLAQUENIL) 200 MG tablet, Take 1 tablet (200 mg total) by mouth daily., Disp: 30 tablet, Rfl: 0 .  levothyroxine (SYNTHROID, LEVOTHROID) 50 MCG tablet, Take 50 mcg by mouth daily before breakfast., Disp: , Rfl:  .  losartan-hydrochlorothiazide (HYZAAR) 50-12.5 MG tablet, TK 1 T PO  ONCE A DAY, Disp: , Rfl: 1 .  Multiple Vitamin (MULTIVITAMIN WITH MINERALS) TABS tablet, Take 1 tablet by mouth daily., Disp: , Rfl:  .  omeprazole (PRILOSEC) 20 MG capsule, Take 20 mg by mouth daily., Disp: , Rfl:  .  Tiotropium Bromide Monohydrate (SPIRIVA RESPIMAT) 1.25 MCG/ACT AERS, Inhale 2 puffs into the lungs daily., Disp: 1 Inhaler, Rfl: 5   Review of Systems     Objective:   Physical Exam  Constitutional: [name], place, and time. She appears well-developed and well-nourished. No distress.  HENT:  Head: Normocephalic and atraumatic.  Right Ear: External ear normal.  Left Ear: External ear normal.  Mouth/Throat: Oropharynx is clear and moist. No oropharyngeal exudate.  Eyes: Pupils are equal, round, and reactive to light. Conjunctivae and EOM are normal. Right eye exhibits no discharge. Left eye exhibits no discharge. No scleral icterus.  Neck: Normal range of motion. Neck supple. No JVD present. No tracheal deviation present. No thyromegaly present.  Cardiovascular: Normal rate, regular rhythm, normal heart sounds and intact distal pulses. Exam reveals no gallop and no friction rub.  No murmur heard. Pulmonary/Chest: Effort normal and breath sounds normal. No respiratory distress. She has no wheezes. She has no rales. She exhibits no tenderness.  Abdominal: Soft. Bowel sounds are normal. She exhibits no distension and no mass. There is no tenderness. There is no rebound and no guarding.  Musculoskeletal: Normal range of motion. She exhibits no edema or tenderness.  Lymphadenopathy:    She has no cervical adenopathy.  Neurological:  She is alert and oriented to person, place, and time. She has normal reflexes. No cranial nerve deficit. She exhibits normal muscle tone. Coordination normal.  Skin: Skin is warm and dry. No rash noted. She is not diaphoretic. No erythema. No pallor.  Psychiatric: She has a normal mood and affect. Her behavior is normal. Judgment  and thought content normal.  Vitals reviewed.  Today's Vitals   08/18/17 1117  BP: 120/64  Pulse: 66  SpO2: 97%  Weight: 164 lb (74.4 kg)  Height: 5' 6"  (1.676 m)    Estimated body mass index is 26.47 kg/m as calculated from the following:   Height as of this encounter: 5' 6"  (1.676 m).   Weight as of this encounter: 164 lb (74.4 kg).     Assessment:       ICD-10-CM   1. ILD (interstitial lung disease) ([place]) J84.9   2. 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 Pulmonary emphysema, unspecified emphysema type ([place]) J43.9   4. Interstitial pulmonary disease ([facility]) J84.9 CT Chest High Resolution       Plan:      Overall stable  Plan Continue spiriva daily We decided against immune modulators We understand that you have difficulty doing breathing test - so we will do scans and symptom based monitpring  followup Repeat hrct early jan 2020  Followup Early jan 2020 after HRCT    Dr. Brand Males, M.D., [facility].C.P Pulmonary and Critical Care Medicine Staff Physician, [place] Director - Interstitial Lung Disease  Program  Pulmonary [place] at [place], [place], [address]  Pager: [phone], If no answer or between  15:00h - 7:00h: call [phone] Telephone: [phone]

## 2017-08-18 NOTE — Progress Notes (Signed)
Unable to complete PFT. Pt uanble to successfully exhale for more than 1 second despite multple technicians attempting. Procedure terminated.

## 2017-08-18 NOTE — Addendum Note (Signed)
Addended by: Elton Sin on: 08/18/2017 12:20 PM   Modules accepted: Orders

## 2017-08-18 NOTE — Patient Instructions (Addendum)
ICD-10-CM   1. ILD (interstitial lung disease) ([place]) J84.9   2. 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 Pulmonary emphysema, unspecified emphysema type ([place]) J43.9     Overall stable  Plan Continue spiriva daily We decided against immune modulators We understand that you have difficulty doing breathing test - so we will do scans and symptom based monitpring  followup Repeat hrct early jan 2020  Followup Early jan 2019 after HRCT

## 2017-09-09 DIAGNOSIS — E119 Type 2 diabetes mellitus without complications: Secondary | ICD-10-CM | POA: Diagnosis not present

## 2017-09-09 DIAGNOSIS — H538 Other visual disturbances: Secondary | ICD-10-CM | POA: Diagnosis not present

## 2017-09-09 DIAGNOSIS — Z01118 Encounter for examination of ears and hearing with other abnormal findings: Secondary | ICD-10-CM | POA: Diagnosis not present

## 2017-09-09 DIAGNOSIS — G629 Polyneuropathy, unspecified: Secondary | ICD-10-CM | POA: Diagnosis not present

## 2017-09-09 DIAGNOSIS — Z136 Encounter for screening for cardiovascular disorders: Secondary | ICD-10-CM | POA: Diagnosis not present

## 2017-09-09 DIAGNOSIS — J45909 Unspecified asthma, uncomplicated: Secondary | ICD-10-CM | POA: Diagnosis not present

## 2017-09-09 DIAGNOSIS — R6889 Other general symptoms and signs: Secondary | ICD-10-CM | POA: Diagnosis not present

## 2017-09-09 DIAGNOSIS — Z0000 Encounter for general adult medical examination without abnormal findings: Secondary | ICD-10-CM | POA: Diagnosis not present

## 2017-09-09 DIAGNOSIS — E039 Hypothyroidism, unspecified: Secondary | ICD-10-CM | POA: Diagnosis not present

## 2017-09-09 DIAGNOSIS — I10 Essential (primary) hypertension: Secondary | ICD-10-CM | POA: Diagnosis not present

## 2017-09-23 DIAGNOSIS — R300 Dysuria: Secondary | ICD-10-CM | POA: Diagnosis not present

## 2017-09-23 DIAGNOSIS — I10 Essential (primary) hypertension: Secondary | ICD-10-CM | POA: Diagnosis not present

## 2017-09-23 DIAGNOSIS — E119 Type 2 diabetes mellitus without complications: Secondary | ICD-10-CM | POA: Diagnosis not present

## 2017-09-23 DIAGNOSIS — R6889 Other general symptoms and signs: Secondary | ICD-10-CM | POA: Diagnosis not present

## 2017-09-23 DIAGNOSIS — J45909 Unspecified asthma, uncomplicated: Secondary | ICD-10-CM | POA: Diagnosis not present

## 2017-09-23 DIAGNOSIS — E039 Hypothyroidism, unspecified: Secondary | ICD-10-CM | POA: Diagnosis not present

## 2017-09-23 DIAGNOSIS — G629 Polyneuropathy, unspecified: Secondary | ICD-10-CM | POA: Diagnosis not present

## 2017-11-04 DIAGNOSIS — H538 Other visual disturbances: Secondary | ICD-10-CM | POA: Diagnosis not present

## 2017-11-04 DIAGNOSIS — H2510 Age-related nuclear cataract, unspecified eye: Secondary | ICD-10-CM | POA: Diagnosis not present

## 2017-11-04 DIAGNOSIS — E1136 Type 2 diabetes mellitus with diabetic cataract: Secondary | ICD-10-CM | POA: Diagnosis not present

## 2017-11-04 DIAGNOSIS — R6889 Other general symptoms and signs: Secondary | ICD-10-CM | POA: Diagnosis not present

## 2017-11-04 DIAGNOSIS — E119 Type 2 diabetes mellitus without complications: Secondary | ICD-10-CM | POA: Diagnosis not present

## 2017-11-04 DIAGNOSIS — H04123 Dry eye syndrome of bilateral lacrimal glands: Secondary | ICD-10-CM | POA: Diagnosis not present

## 2017-11-16 DIAGNOSIS — J432 Centrilobular emphysema: Secondary | ICD-10-CM | POA: Diagnosis not present

## 2017-11-16 DIAGNOSIS — Z23 Encounter for immunization: Secondary | ICD-10-CM | POA: Diagnosis not present

## 2017-11-16 DIAGNOSIS — I10 Essential (primary) hypertension: Secondary | ICD-10-CM | POA: Diagnosis not present

## 2017-11-16 DIAGNOSIS — E119 Type 2 diabetes mellitus without complications: Secondary | ICD-10-CM | POA: Diagnosis not present

## 2017-11-16 DIAGNOSIS — E039 Hypothyroidism, unspecified: Secondary | ICD-10-CM | POA: Diagnosis not present

## 2017-11-16 DIAGNOSIS — G629 Polyneuropathy, unspecified: Secondary | ICD-10-CM | POA: Diagnosis not present

## 2017-11-16 DIAGNOSIS — R6889 Other general symptoms and signs: Secondary | ICD-10-CM | POA: Diagnosis not present

## 2017-12-28 DIAGNOSIS — J432 Centrilobular emphysema: Secondary | ICD-10-CM | POA: Diagnosis not present

## 2017-12-28 DIAGNOSIS — I10 Essential (primary) hypertension: Secondary | ICD-10-CM | POA: Diagnosis not present

## 2017-12-28 DIAGNOSIS — G629 Polyneuropathy, unspecified: Secondary | ICD-10-CM | POA: Diagnosis not present

## 2017-12-28 DIAGNOSIS — E039 Hypothyroidism, unspecified: Secondary | ICD-10-CM | POA: Diagnosis not present

## 2017-12-28 DIAGNOSIS — E119 Type 2 diabetes mellitus without complications: Secondary | ICD-10-CM | POA: Diagnosis not present

## 2017-12-28 DIAGNOSIS — R6889 Other general symptoms and signs: Secondary | ICD-10-CM | POA: Diagnosis not present

## 2018-02-11 ENCOUNTER — Ambulatory Visit (INDEPENDENT_AMBULATORY_CARE_PROVIDER_SITE_OTHER)
Admission: RE | Admit: 2018-02-11 | Discharge: 2018-02-11 | Disposition: A | Discharge status: Home / Self Care | Payer: Medicare HMO | Source: Ambulatory Visit | Attending: Internal Medicine | Admitting: Internal Medicine

## 2018-02-11 DIAGNOSIS — J849 Interstitial pulmonary disease, unspecified: Secondary | ICD-10-CM | POA: Diagnosis not present

## 2018-02-11 DIAGNOSIS — R0602 Shortness of breath: Secondary | ICD-10-CM | POA: Diagnosis not present

## 2018-02-15 ENCOUNTER — Encounter: Payer: Self-pay | Admitting: Internal Medicine

## 2018-02-15 ENCOUNTER — Telehealth: Payer: Self-pay | Admitting: Internal Medicine

## 2018-02-15 ENCOUNTER — Ambulatory Visit (INDEPENDENT_AMBULATORY_CARE_PROVIDER_SITE_OTHER): Payer: Medicare HMO | Admitting: Internal Medicine

## 2018-02-15 VITALS — BP 122/70 | HR 74 | Ht 66.0 in | Wt 163.8 lb

## 2018-02-15 DIAGNOSIS — Z23 Encounter for immunization: Secondary | ICD-10-CM | POA: Diagnosis not present

## 2018-02-15 DIAGNOSIS — I288 Other diseases of pulmonary vessels: Secondary | ICD-10-CM | POA: Diagnosis not present

## 2018-02-15 DIAGNOSIS — J8489 Other specified interstitial pulmonary diseases: Secondary | ICD-10-CM | POA: Diagnosis not present

## 2018-02-15 DIAGNOSIS — M358 Other specified systemic involvement of connective tissue: Secondary | ICD-10-CM

## 2018-02-15 DIAGNOSIS — J849 Interstitial pulmonary disease, unspecified: Secondary | ICD-10-CM | POA: Diagnosis not present

## 2018-02-15 DIAGNOSIS — J439 Emphysema, unspecified: Secondary | ICD-10-CM | POA: Diagnosis not present

## 2018-02-15 NOTE — Patient Instructions (Addendum)
ILD (interstitial lung disease) ([place]) Interstitial lung disease due to connective tissue disease ([place])  - continue supportive care  -We discussed a tablet called nintedanib which is preventive.  -Agree is not too enthused about the side effects and agree that we can consider this more strongly if the lung disease were to decline - try doing spirometry test next visit  - let us see if you can achieve it technically  - do spiro/dlco in 6 months   Pulmonary emphysema, unspecified emphysema type ([place])  - continue spiriva - have prevnar vaccine 02/15/2018  Enlarged Pulmonary artery  - right heart cath if symptoms decline  Followup ILD clinic in 6 months

## 2018-02-15 NOTE — Telephone Encounter (Signed)
Called and spoke with pt  She was returning [facility] call regarding ct  The results were discussed at ov with MR today  Nothing further needed

## 2018-02-15 NOTE — Progress Notes (Signed)
This was discussed with the pt at ov today

## 2018-02-15 NOTE — Progress Notes (Signed)
HPI  PCP Benito Mccreedy, MD  HPI  IOV 07/16/2016  Chief Complaint  Patient presents with  . Shortness of Breath    consult for sob referring doctor is Dr. Einar Gip patient states that she is very sob and using the inhaler helps her very much     According to the referral chart from Dr. Einar Gip, he'll originally saw her on 06/26/2016 for shortness of breath at follow-up. Patient reported to him that she's had insidious onset of shortness of breath for several years with progression in the last several months associated with bilateral chest tightness. Apparently patient was given a diagnosis of asthma and bronchodilator use does help her. Per the patient history spirometry testing outside was abnormal. Dyspnea is significant enough patient not able to do her household work. There is associated fatigue. A trial of diuresis by the primary care physician did help the shortness of breath. Patient's symptoms are associated with lisinopril intake for hypertension.  exhaled nitric oxide test today in the office is 19 ppb and normal.  IgE. ''Walking desaturation test on 07/16/2016 185 feet x 3 laps on room air forehead probe:  did NOT desaturate. Rest pulse ox was 100%, final pulse ox was 100%. HR response 75/min at rest to 85/min at peak exertion. 2-D echocardiogram 02/21/2016 as reviewed from the outside chart shows mild concentric left ventricular remodeling with ejection fraction 79. Normal right ventricle size and contractility. No evidence of diastolic dysfunction. No pericardial effusion    In talking to the patient and her daughter [name] the above information was confirmed. In addition she is on Advair discus for the last few months because of the above problems. It is helping her. She takes it as needed which ends up being every other day once. Today the exhaled nitric oxide was normal 16 ppb which is essentially on Advair. She is also on lisinopril for the last few years. She does have  associated mild cough. Evaluation by cardiology did show crackles in the concern is he has interstitial lung disease therefore she is referred here. Associated with concern of interstitial lung disease is a history of lupus that has been present for many years. When she relocated to [facility] few years ago with a primary care physician has been following this. Patient is on plaquenil but not steroids The family is unsure about SLE blood work  Results for KRISTIANN, NOYCE (MRN [id number]) as of 07/16/2016 10:45  Ref. Range 01/05/2015 09:31 01/12/2015 16:07 01/12/2015 19:45 01/12/2015 20:00 01/12/2015 20:02  Hemoglobin Latest Ref Range: 12.0 - 15.0 g/dL 10.4 (L) 11.9 (L)        OV 07/30/2016  Chief Complaint  Patient presents with  . Follow-up    Pt was here for a PFT but was unable to complete it. Pt continues to be SOB with some improvement, occ. dry cough.   Here to follow-up for pulmonary test results for interstitial lung disease workup. In terms of test results her ESR is high ANA is positive and her Sjogren's antibodies positive but lupus antibodies and rheumatoid factor antibodies are negative. Her CT scan does show interstitial lung disease at the base. She could not complete pulmonary function test due to inability to follow direction but I suspect the disease is mild because of walking desaturation test last visit was mild. Her daughter and she appeared to understand these test results. She reports dyspnea and cough. She is on ACE inhibitor.    OV 02/02/2017  Chief Complaint  Patient presents  with  . Follow-up    Pt states she  has been doing good since last visit. Denies any complaints of cough, SOB, or CP.   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  In the interim she has no complaints.  She only has mild dyspnea on exertion.  Walking desaturation test on 185 feet x3 laps on room air.  Resting pulse ox  100%.  Final pulse ox 97%.  Resting heart rate was 70/min.  Final heart rate was 87/min.  She did see a rheumatologist Dr. Gavin Pound in practice last month.  I reviewed the note.  It appears that they are not finding any other systemic evidence of autoimmune disease.  Therefore she is still maintained on Plaquenil.  According to the notes patient is also Smith and RNP antibody positive.  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  Most recent CT scan of the chest in summer 2018 as a possible/probable UIP.  Results for IVERNA, HAMMAC (MRN [id number]) as of 07/30/2016 10:00  Ref. Range 07/16/2016 12:09 07/16/2016 12:18  Nitric Oxide Unknown  16  Sed Rate Latest Ref Range: 0 - 30 mm/hr 77 (H)   Anit Nuclear Antibody(ANA) Latest Ref Range: NEGATIVE  POS (A)   ANA Pattern 1 Unknown NUCLEOLAR (A)   ANA Titer 1 Latest Units: titer 4:327 (H)   Cyclic Citrullin Peptide Ab Latest Units: Units <16   ds DNA Ab Latest Units: IU/mL 1   RA Latex Turbid. Latest Ref Range: <14 IU/mL <14   SSA (Ro) (ENA) Antibody, IgG Latest Ref Range: <1.0 NEG AI  >8.0 POS (H)   SSB (La) (ENA) Antibody, IgG Latest Ref Range: <1.0 NEG AI  <1.0 NEG   Scleroderma (Scl-70) (ENA) Antibody, IgG Latest Ref Range: <1.0 NEG AI  <1.0 NEG     IMPRESSION: CT CHEST HIGH RESOLUTION 1. 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 Differential includes fibrotic nonspecific interstitial pneumonia (NSIP). Follow-up high-resolution chest CT in 12 months would be useful to assess temporal pattern stability, as clinically warranted. 2. Solitary 3 mm solid right upper lobe pulmonary nodule. No follow-up needed if patient is low-risk. Non-contrast chest CT can be considered in 12 months if patient is high-risk. This recommendation follows the consensus statement: Guidelines for Management  of Incidental Pulmonary Nodules Detected on CT Images: From the Fleischner Society 2017; Radiology 2017; 284:228-243. 3. Left main and 3 vessel coronary atherosclerosis. 4. Small hiatal hernia. 5. Diffuse hepatic steatosis.  Aortic Atherosclerosis (ICD10-I70.0) and Emphysema (ICD10-J43.9).   Electronically Signed   By: [name] M.D.   On: 07/22/2016 14:17  OV 05/18/2017  Chief Complaint  Patient presents with  . Follow-up    PFT attempted today but pt was unable to complete as per Ria Comment pt did not want to do the PFT.  HRCT done 1/21.  Pt states she has been doing well. Breathing is about the same as last visit. Denies any complaints or concerns.    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  In the interim overall she is stable.  She is an extremely poor historian.  After asking her multiple times in different ways she believes earlier this year she had a cardiac stress test for a coronary artery calcification by Dr. Einar Gip and this was normal.  She is completely unaware of the fact that she has interstitial lung disease.  She believes she has asthma but when I questioned her and directed her that I have explained to her that she has pulmonary fibrosis she then admitted to the in fact she understands that she has pulmonary fibrosis.  Overall she is stable she has mild dyspnea on exertion without any cough.  She thinks she had lupus diagnosed by a rheumatologist in [facility] but when I told her that there is no systemic evidence of autoimmune disease when she saw [facility] rheumatology she seems surprised that she even made that visit.  There are no new issues.   Walking desaturation test on 05/18/2017 185 feet x 3 laps on ROOM AIR:  did not have symptioms or  desaturate. Rest pulse ox was 100%, final pulse ox was 95%. HR response 69/min at rest to 90/min at peak exertion. Patient Lani Havlik  Did not  Desaturate < 88% . Deatra James yes did  Desaturated </= 3% points. Deatra James yes did get tachyardic   IMPRESSION: Lungs/Pleura: No pneumothorax. No pleural effusion. Posterior right upper lobe 3 mm solid pulmonary nodule (series 5/image 39), stable since 07/22/2016 chest CT. No acute consolidative airspace disease, lung masses or new significant pulmonary nodules. No significant air trapping on the expiration sequence. There is patchy confluent subpleural reticulation and ground-glass attenuation in both lungs with associated mild traction bronchiectasis and minimal architectural distortion. There is mild honeycombing throughout both lung bases. Findings have not convincingly progressed since 07/22/2016 chest CT. 1. No appreciable interval progression of basilar predominant fibrotic interstitial lung disease with mild honeycombing in the intervening 6 months. Findings are still considered to represent probable usual interstitial pneumonia (UIP), with the differential including fibrotic phase nonspecific interstitial pneumonia (NSIP). Follow-up high-resolution chest CT recommended in 12 months to assess ongoing temporal pattern stability. 2. Tiny right upper lobe pulmonary nodule is stable for 6 months and probably benign. 3. Left main and 3 vessel coronary atherosclerosis. 4. Small hiatal hernia.  Aortic Atherosclerosis (ICD10-I70.0).   Electronically Signed   By: [name] M.D.   On: 02/15/2017 16:05   OV 08/18/2017  Chief Complaint  Patient presents with  . Follow-up    PFT attempted today but pt was unable to complete.  Pt states she is about the same since last visit. Denies any real complaints of SOB and denies any real complaints.    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 Immunomodulators of probably due to  toxic follow-up. Moreover her ILD appears stable over time. Therefore we decided to treat her with  Spiriva. She is here for follow-up she tells me she is feeling better. She likes her Spiriva. This is actually helping her shortness of breath. Walking desaturation test is essentially normal. She had a pulmonary function test today but she could not follow through with instructions. There are no other new issues. She denies any ER visits   OV 02/15/2018  Subjective:  Patient ID: [name], female , DOB: February 07, 1940 , age 35 y.o. , MRN: [id number] , ADDRESS: [address] [id number]   02/15/2018 -   Chief Complaint  Patient presents with  . Follow-up    Pt states she has been doing well since last visit. States she does become SOB when she exerts herself and states she does  have an occ. dry cough. Denies any complaints of CP/chest tightness.    Follow-up interstitial lung disease UIP radiology patter: with autoimmune features serology - IPAF (June 2018 ANA 1:320, SSA > 8, dry eyes/mount + eSR 71)   - on supportive care due to goals of care (could not do PFT technically 2019) -   Follow-up mild associated emphysema on CT scan - on spiriva     HPI Mora Hoelting 77 y.o. -last seen April 2019.  Is a routine follow-up for the above issues.  She expresses that she has dry eyes and dry mouth.  She again does not recollect seeing a rheumatologist.  She is on supportive care but she is on Plaquenil.  Therefore she is probably seeing a rheumatologist.  She is unable to do pulmonary function test therefore she did a walk test with [place] and it is stable and documented below she also had high-resolution CT chest that I visualized as documented below.  She has UIP pattern but it is stable and a year and a half now.  She has no worsening dyspnea.  No interim complaints.  She does have coronary artery calcification on the CT scan but she says she saw Dr. Einar Gip approximately a year ago and a stress  test was normal.  She continues with Spiriva for the associated emphysema from remote smoking.  This is helping her.  We discussed about taking anti-fibrotic's which would be indicated if this was IPF but because she has autoimmune features it would be indicated only if she got worse.  In either event she seems somewhat nervous about taking a drug that has GI side effect potential.  She wants to wait and watch.      Simple office walk 185 feet x  3 laps goal with forehead probe 08/18/2017    02/15/2018   O2 used *room air Room air  Number laps completed 3 3  Comments about pace Normal pace Slow pace  Resting Pulse Ox/HR 100% and 67/min 100% and 73/min  Final Pulse Ox/HR 100% and 91/min 100% and 96/min  Desaturated </= 88% no No   Desaturated <= 3% points no no  Got Tachycardic >/= 90/min yes yes  Symptoms at end of test Mild dyspnea Mild dyspnea  Miscellaneous comments x x     HRCT Jan 2020 IMPRESSION: 1. Pulmonary parenchymal pattern of fibrosis, as described above, stable from 07/22/2016. Findings are consistent with UIP per consensus guidelines: Diagnosis of Idiopathic Pulmonary Fibrosis: An Official ATS/ERS/JRS/ALAT Clinical Practice Guideline. [place], Iss 5, [phone], Sep 26 2016. 2. Liver appears steatotic. 3. Aortic atherosclerosis (ICD10-170.0). Coronary artery calcification. 4. Enlarged pulmonary arteries, indicative of pulmonary arterial hypertension.   Electronically Signed   By: Lorin Picket M.D.   On: 02/11/2018 12:04   ROS - per HPI     has a past medical history of Breast cancer ([place]), Diabetes mellitus without complication ([place]), Hypertension, Hypothyroidism, and Lupus ([place]).   reports that she has never smoked. She quit smokeless tobacco use about 3 years ago.  Her smokeless tobacco use included snuff.  Past Surgical History:  Procedure Laterality Date  . BREAST LUMPECTOMY  2009    Allergies  Allergen Reactions    . Ciprofloxacin Itching, Swelling and Other (See Comments)    Possibly causing tremors?    Immunization History  Administered Date(s) Administered  . Influenza, High Dose Seasonal PF 11/09/2016  . Influenza,inj,Quad PF,6+ Mos 10/27/2015    Family  History  Problem Relation Age of Onset  . Cancer Father        ? type     Current Outpatient Medications:  .  ADVAIR DISKUS 100-50 MCG/DOSE AEPB, Inhale 2 puffs into the lungs daily., Disp: , Rfl: 1 .  gabapentin (NEURONTIN) 300 MG capsule, Take 300 mg by mouth 2 (two) times daily., Disp: , Rfl:  .  glimepiride (AMARYL) 1 MG tablet, Take 1 mg by mouth daily., Disp: , Rfl: 3 .  hydroxychloroquine (PLAQUENIL) 200 MG tablet, Take 1 tablet (200 mg total) by mouth daily., Disp: 30 tablet, Rfl: 0 .  levothyroxine (SYNTHROID, LEVOTHROID) 75 MCG tablet, , Disp: , Rfl:  .  losartan-hydrochlorothiazide (HYZAAR) 50-12.5 MG tablet, TK 1 T PO  ONCE A DAY, Disp: , Rfl: 1 .  Multiple Vitamin (MULTIVITAMIN WITH MINERALS) TABS tablet, Take 1 tablet by mouth daily., Disp: , Rfl:  .  omeprazole (PRILOSEC) 20 MG capsule, Take 20 mg by mouth daily., Disp: , Rfl:  .  Tiotropium Bromide Monohydrate (SPIRIVA RESPIMAT) 1.25 MCG/ACT AERS, Inhale 2 puffs into the lungs daily., Disp: 1 Inhaler, Rfl: 5      Objective:   Vitals:   02/15/18 1138  BP: 122/70  Pulse: 74  SpO2: 100%  Weight: 163 lb 12.8 oz (74.3 kg)  Height: _0  (1.676 m)    Estimated body mass index is 26.44 kg/m as calculated from the following:   Height as of this encounter: _1  (1.676 m).   Weight as of this encounter: 163 lb 12.8 oz (74.3 kg).  _2 @  [employer]   02/15/18 1138  Weight: 163 lb 12.8 oz (74.3 kg)     Physical Exam  General Appearance:    Alert, cooperative, no distress, appears stated age - yes , Deconditioned looking - no , OBESE  - no, Sitting on Wheelchair -  no  Head:    Normocephalic, without obvious abnormality, atraumatic  Eyes:    PERRL,  conjunctiva/corneas clear,  Ears:    Normal TM's and external ear canals, both ears  Nose:   Nares normal, septum midline, mucosa normal, no drainage    or sinus tenderness. OXYGEN ON  - no . Patient is @ ra   Throat:   Lips, mucosa, and tongue normal; teeth and gums normal. Cyanosis on lips - no  Neck:   Supple, symmetrical, trachea midline, no adenopathy;    thyroid:  no enlargement/tenderness/nodules; no carotid   bruit or JVD  Back:     Symmetric, no curvature, ROM normal, no CVA tenderness  Lungs:     Distress - no , Wheeze no, Barrell Chest - no, Purse lip breathing - no, Crackles - yes at base   Chest Wall:    No tenderness or deformity.    Heart:    Regular rate and rhythm, S1 and S2 normal, no rub   or gallop, Murmur - no  Breast Exam:    NOT DONE  Abdomen:     Soft, non-tender, bowel sounds active all four quadrants,    no masses, no organomegaly. Visceral obesity - no  Genitalia:   NOT DONE  Rectal:   NOT DONE  Extremities:   Extremities - normal, Has Cane - no, Clubbing - no, Edema - no  Pulses:   2+ and symmetric all extremities  Skin:   Stigmata of Connective Tissue Disease - STIGMATA of CONNECTIVE TISSUE DISEASE  - Distal digital fissuring (ie, "mechanic hands") - no - Distal digital  tip ulceration - no -Inflammatory arthritis or polyarticular morning joint stiffness ?60 minutes - no - Palmar telangiectasia - no - Raynaud phenomenon - no - Unexplained digital edema - no - Unexplained fixed rash on the digital extensor surfaces (Gottron's sign) - no ... - Deformities of RA - no - Scleroderma  - no - Malar Rash -  no   Lymph nodes:   Cervical, supraclavicular, and axillary nodes normal  Psychiatric:  Neurologic:   Pleasant - yes, Anxious - no, Flat affect - yes  CAm-ICU - neg, Alert and Oriented x 3 - yes, Moves all 4s - yes, Speech - normal, Cognition - intact           Assessment:       ICD-10-CM   1. ILD (interstitial lung disease) ([place]) J84.9   2.  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 Pulmonary emphysema, unspecified emphysema type ([place]) J43.9        Plan:     Patient Instructions  ILD (interstitial lung disease) ([place]) Interstitial lung disease due to connective tissue disease ([place])  - continue supportive care  -We discussed a tablet called nintedanib which is preventive.  -Agree is not too enthused about the side effects and agree that we can consider this more strongly if the lung disease were to decline - try doing spirometry test next visit  - let us see if you can achieve it technically  - do spiro/dlco in 6 months   Pulmonary emphysema, unspecified emphysema type ([place])  - continue spiriva - have pneumovax vaccine 02/15/2018   Followup ILD clinic in 6 months  > 50% of this > 25 min visit spent in face to face counseling or coordination of care - by this undersigned MD - Dr Brand Males. This includes one or more of the following documented above: discussion of test results, diagnostic or treatment recommendations, prognosis, risks and benefits of management options, instructions, education, compliance or risk-factor reduction    SIGNATURE    Dr. Brand Males, M.D., F.C.C.P,  Pulmonary and Critical Care Medicine Staff Physician, [place] Director - Interstitial Lung Disease  Program  [facility] [place] [facility] [place], [place], [address]  Pager: [phone], If no answer or between  15:00h - 7:00h: call [phone] Telephone: [phone]  12:20 PM 02/15/2018

## 2018-03-08 DIAGNOSIS — N3000 Acute cystitis without hematuria: Secondary | ICD-10-CM | POA: Diagnosis not present

## 2018-03-08 DIAGNOSIS — I10 Essential (primary) hypertension: Secondary | ICD-10-CM | POA: Diagnosis not present

## 2018-03-08 DIAGNOSIS — G629 Polyneuropathy, unspecified: Secondary | ICD-10-CM | POA: Diagnosis not present

## 2018-03-08 DIAGNOSIS — E119 Type 2 diabetes mellitus without complications: Secondary | ICD-10-CM | POA: Diagnosis not present

## 2018-03-08 DIAGNOSIS — J432 Centrilobular emphysema: Secondary | ICD-10-CM | POA: Diagnosis not present

## 2018-03-08 DIAGNOSIS — E039 Hypothyroidism, unspecified: Secondary | ICD-10-CM | POA: Diagnosis not present

## 2018-03-23 IMAGING — CT CT CHEST HIGH RESOLUTION W/O CM
2 of 5 series · 15 of 36 positions shown, 18 images · non-contrast
Comparison: 07/22/2016 high-resolution chest CT.

CLINICAL DATA: Follow-up interstitial lung disease. History breast
cancer.

EXAM:
CT CHEST WITHOUT CONTRAST
TECHNIQUE: Multidetector CT imaging of the chest was performed following the
standard protocol without intravenous contrast. High resolution
imaging of the lungs, as well as inspiratory and expiratory imaging,
was performed.

[Series 3: high resolution · axial · 0.59mm/px · z∈[+1267,+1507]mm · 12 of 133 slices shown, 15 images]
[im 7/133  mediastinal]
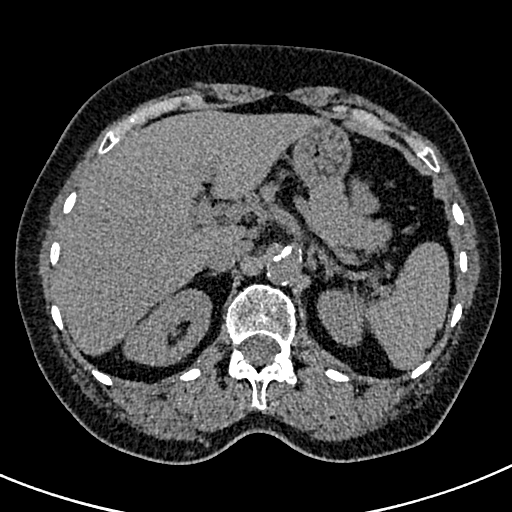
[im 7/133  lung]
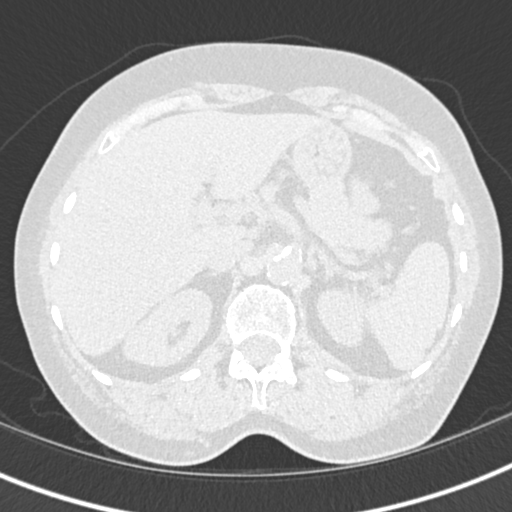
[im 19/133  lung]
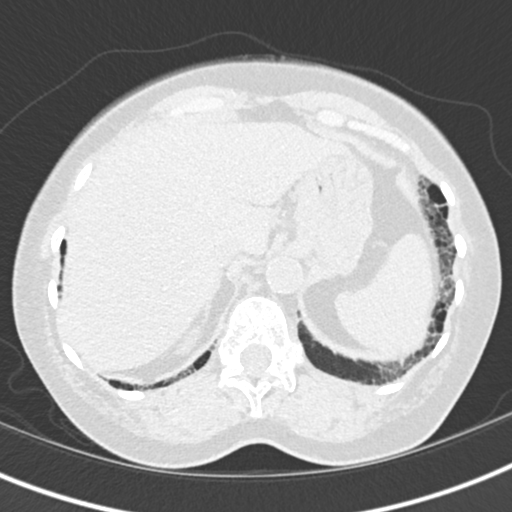
[im 31/133  lung]
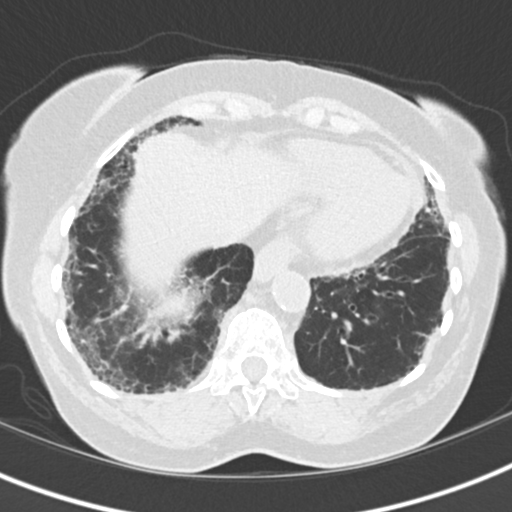
[im 43/133  lung]
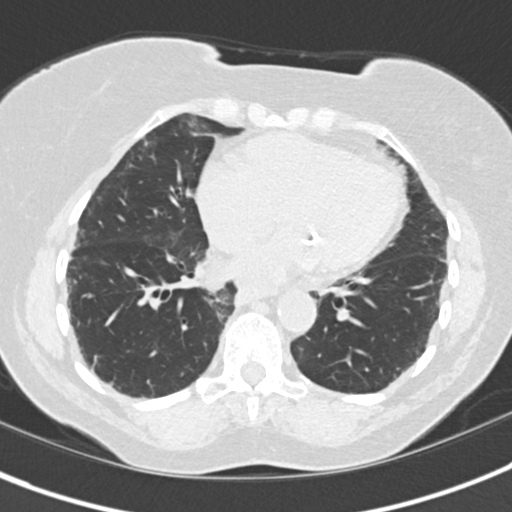
[im 49/133  mediastinal]
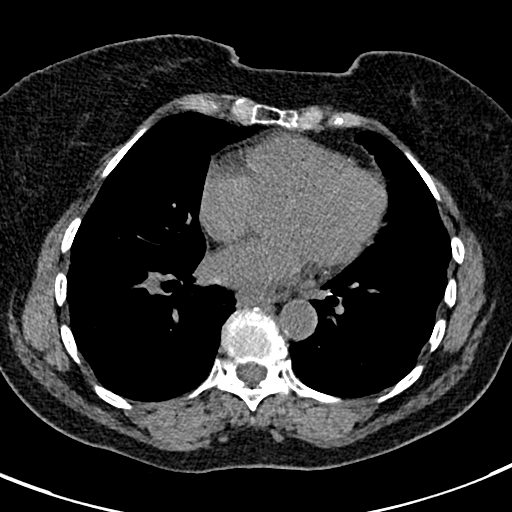
[im 49/133  lung]
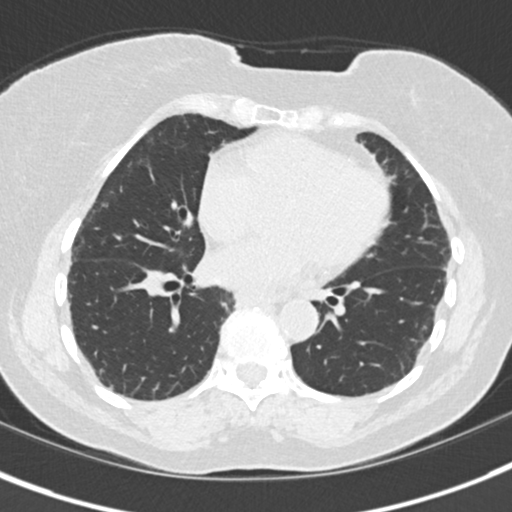
[im 61/133  lung]
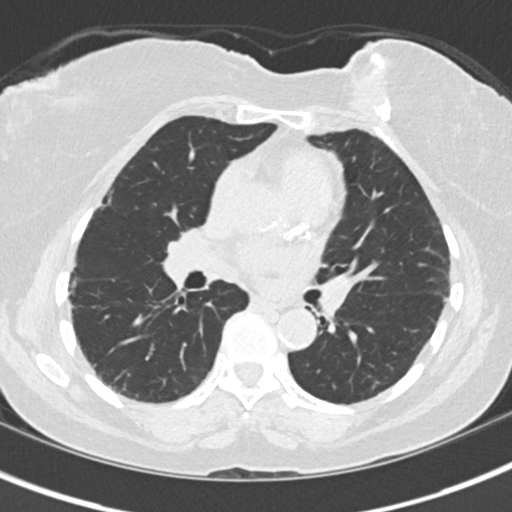
[im 73/133  lung]
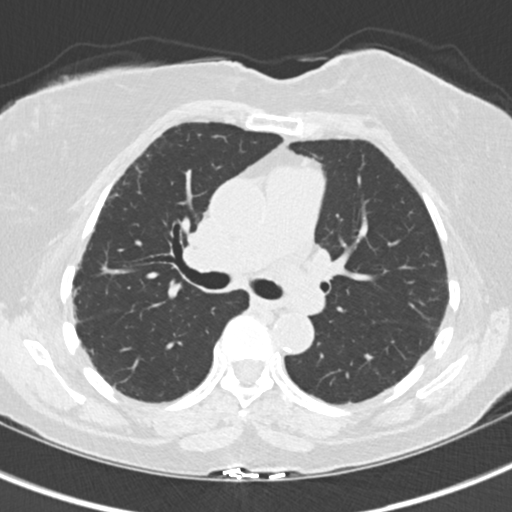
[im 85/133  lung]
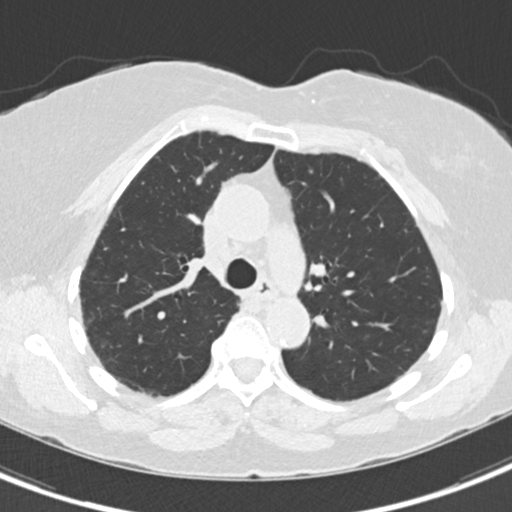
[im 91/133  mediastinal]
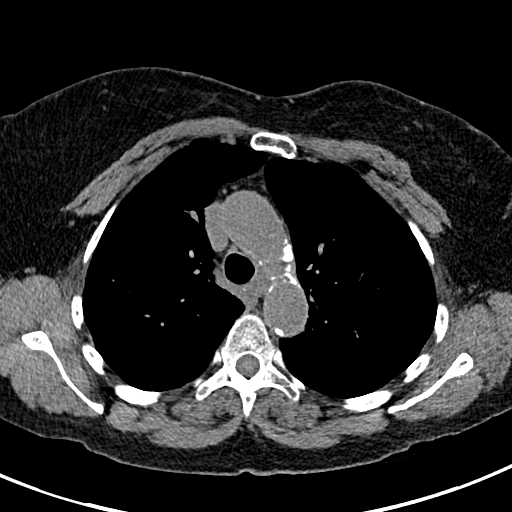
[im 91/133  lung]
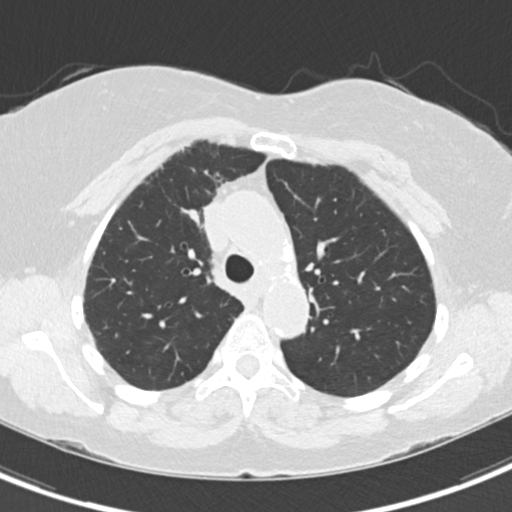
[im 103/133  lung]
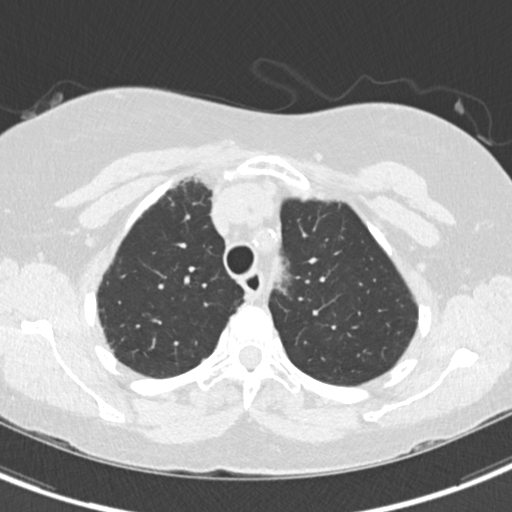
[im 115/133  lung]
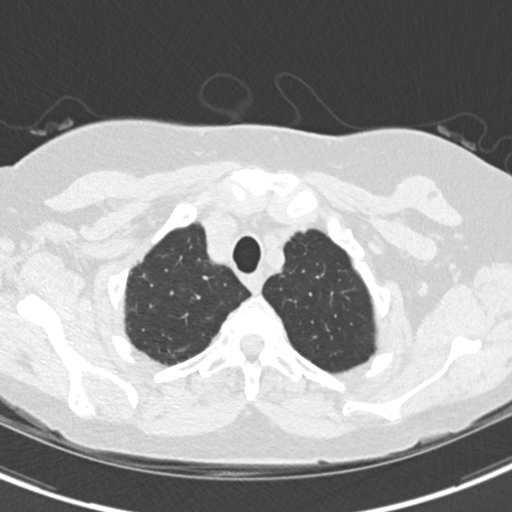
[im 127/133  lung]
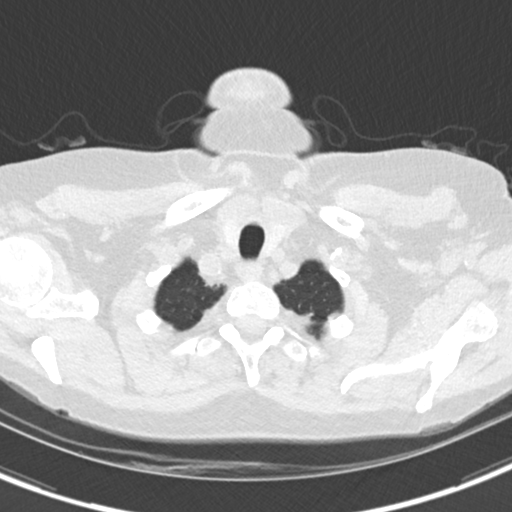

[Series 7: coronal · coronal · 0.55mm/px · 3 of 117 slices shown]
[im 24/117  lung]
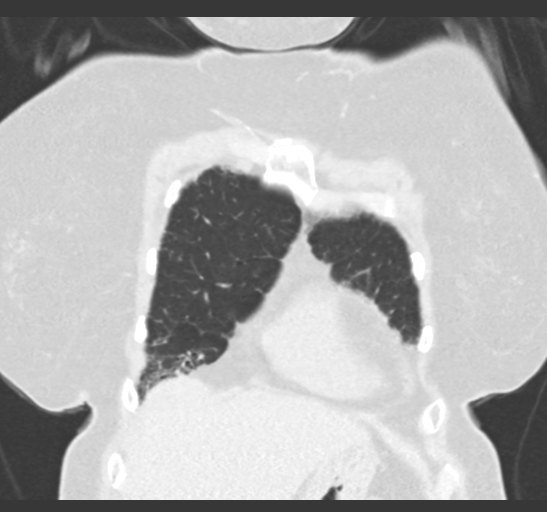
[im 47/117  lung]
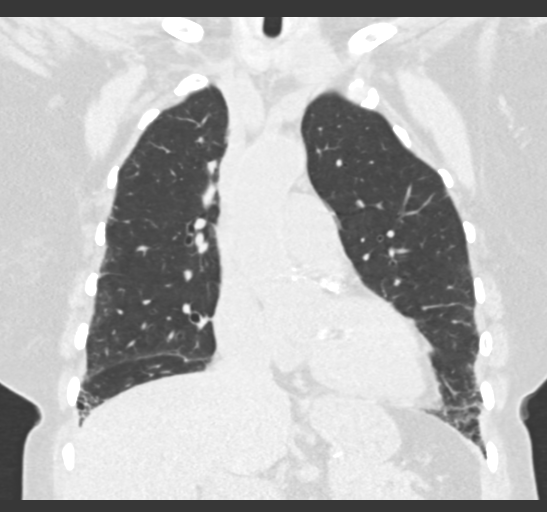
[im 70/117  lung]
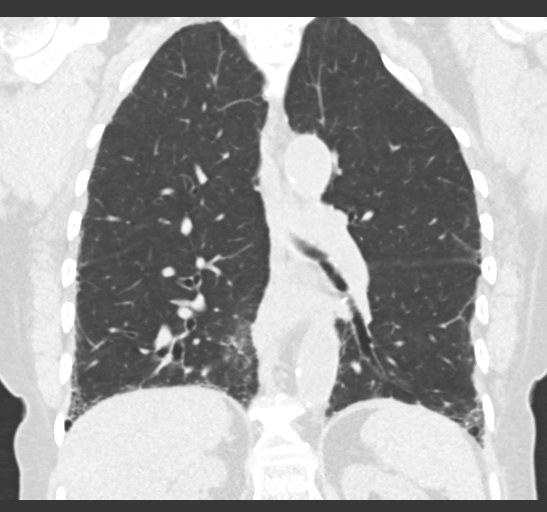

[15 of 36 positions shown; findings below may reference images not displayed]

FINDINGS: Cardiovascular: Normal heart size. Trace pericardial
effusion/thickening is stable. Left main and three-vessel coronary
atherosclerosis. Atherosclerotic nonaneurysmal thoracic aorta.
Stable top-normal caliber main pulmonary artery (3.1 cm diameter).

Mediastinum/Nodes: No discrete thyroid nodules. Unremarkable
esophagus. No pathologically enlarged axillary, mediastinal or gross
hilar lymph nodes, noting limited sensitivity for the detection of
hilar adenopathy on this noncontrast study.

Lungs/Pleura: No pneumothorax. No pleural effusion. Posterior right
upper lobe 3 mm solid pulmonary nodule (series 5/image 39), stable
since 07/22/2016 chest CT. No acute consolidative airspace disease,
lung masses or new significant pulmonary nodules. No significant air
trapping on the expiration sequence. There is patchy confluent
subpleural reticulation and ground-glass attenuation in both lungs
with associated mild traction bronchiectasis and minimal
architectural distortion. There is mild honeycombing throughout both
lung bases. Findings have not convincingly progressed since
07/22/2016 chest CT.

Upper abdomen: Small hiatal hernia.

Musculoskeletal: No aggressive appearing focal osseous lesions.
Moderate thoracic spondylosis. Stable cystic and calcific changes in
the medial left breast compatible with post lumpectomy change.
IMPRESSION: 1. No appreciable interval progression of basilar predominant
fibrotic interstitial lung disease with mild honeycombing in the
intervening 6 months. Findings are still considered to represent
probable usual interstitial pneumonia (UIP), with the differential
including fibrotic phase nonspecific interstitial pneumonia (NSIP).
Follow-up high-resolution chest CT recommended in 12 months to
assess ongoing temporal pattern stability.
2. Tiny right upper lobe pulmonary nodule is stable for 6 months and
probably benign.
3. Left main and 3 vessel coronary atherosclerosis.
4. Small hiatal hernia.

Aortic Atherosclerosis ([id number].W).

## 2018-04-12 DIAGNOSIS — M150 Primary generalized (osteo)arthritis: Secondary | ICD-10-CM | POA: Diagnosis not present

## 2018-04-12 DIAGNOSIS — M2550 Pain in unspecified joint: Secondary | ICD-10-CM | POA: Diagnosis not present

## 2018-04-12 DIAGNOSIS — Z79899 Other long term (current) drug therapy: Secondary | ICD-10-CM | POA: Diagnosis not present

## 2018-04-12 DIAGNOSIS — N183 Chronic kidney disease, stage 3 (moderate): Secondary | ICD-10-CM | POA: Diagnosis not present

## 2018-04-12 DIAGNOSIS — J849 Interstitial pulmonary disease, unspecified: Secondary | ICD-10-CM | POA: Diagnosis not present

## 2018-04-12 DIAGNOSIS — M3219 Other organ or system involvement in systemic lupus erythematosus: Secondary | ICD-10-CM | POA: Diagnosis not present

## 2018-04-12 DIAGNOSIS — E1122 Type 2 diabetes mellitus with diabetic chronic kidney disease: Secondary | ICD-10-CM | POA: Diagnosis not present

## 2018-04-12 DIAGNOSIS — Z6826 Body mass index (BMI) 26.0-26.9, adult: Secondary | ICD-10-CM | POA: Diagnosis not present

## 2018-04-12 DIAGNOSIS — E663 Overweight: Secondary | ICD-10-CM | POA: Diagnosis not present

## 2018-05-24 DIAGNOSIS — G629 Polyneuropathy, unspecified: Secondary | ICD-10-CM | POA: Diagnosis not present

## 2018-05-24 DIAGNOSIS — J432 Centrilobular emphysema: Secondary | ICD-10-CM | POA: Diagnosis not present

## 2018-05-24 DIAGNOSIS — E039 Hypothyroidism, unspecified: Secondary | ICD-10-CM | POA: Diagnosis not present

## 2018-05-24 DIAGNOSIS — I10 Essential (primary) hypertension: Secondary | ICD-10-CM | POA: Diagnosis not present

## 2018-05-24 DIAGNOSIS — E119 Type 2 diabetes mellitus without complications: Secondary | ICD-10-CM | POA: Diagnosis not present

## 2018-06-14 DIAGNOSIS — Z0001 Encounter for general adult medical examination with abnormal findings: Secondary | ICD-10-CM | POA: Diagnosis not present

## 2018-06-14 DIAGNOSIS — Z5181 Encounter for therapeutic drug level monitoring: Secondary | ICD-10-CM | POA: Diagnosis not present

## 2018-06-14 DIAGNOSIS — Z01021 Encounter for examination of eyes and vision following failed vision screening with abnormal findings: Secondary | ICD-10-CM | POA: Diagnosis not present

## 2018-06-14 DIAGNOSIS — Z1329 Encounter for screening for other suspected endocrine disorder: Secondary | ICD-10-CM | POA: Diagnosis not present

## 2018-06-14 DIAGNOSIS — J432 Centrilobular emphysema: Secondary | ICD-10-CM | POA: Diagnosis not present

## 2018-06-14 DIAGNOSIS — I10 Essential (primary) hypertension: Secondary | ICD-10-CM | POA: Diagnosis not present

## 2018-06-14 DIAGNOSIS — Z01118 Encounter for examination of ears and hearing with other abnormal findings: Secondary | ICD-10-CM | POA: Diagnosis not present

## 2018-06-14 DIAGNOSIS — Z131 Encounter for screening for diabetes mellitus: Secondary | ICD-10-CM | POA: Diagnosis not present

## 2018-06-14 DIAGNOSIS — Z1389 Encounter for screening for other disorder: Secondary | ICD-10-CM | POA: Diagnosis not present

## 2018-06-14 DIAGNOSIS — E119 Type 2 diabetes mellitus without complications: Secondary | ICD-10-CM | POA: Diagnosis not present

## 2018-06-14 DIAGNOSIS — E039 Hypothyroidism, unspecified: Secondary | ICD-10-CM | POA: Diagnosis not present

## 2018-06-14 DIAGNOSIS — G629 Polyneuropathy, unspecified: Secondary | ICD-10-CM | POA: Diagnosis not present

## 2018-06-29 DIAGNOSIS — E039 Hypothyroidism, unspecified: Secondary | ICD-10-CM | POA: Diagnosis not present

## 2018-06-29 DIAGNOSIS — R300 Dysuria: Secondary | ICD-10-CM | POA: Diagnosis not present

## 2018-06-29 DIAGNOSIS — G629 Polyneuropathy, unspecified: Secondary | ICD-10-CM | POA: Diagnosis not present

## 2018-06-29 DIAGNOSIS — J432 Centrilobular emphysema: Secondary | ICD-10-CM | POA: Diagnosis not present

## 2018-06-29 DIAGNOSIS — E119 Type 2 diabetes mellitus without complications: Secondary | ICD-10-CM | POA: Diagnosis not present

## 2018-06-29 DIAGNOSIS — I10 Essential (primary) hypertension: Secondary | ICD-10-CM | POA: Diagnosis not present

## 2018-07-18 ENCOUNTER — Other Ambulatory Visit: Payer: Self-pay | Admitting: *Deleted

## 2018-07-18 DIAGNOSIS — Z20822 Contact with and (suspected) exposure to covid-19: Secondary | ICD-10-CM

## 2018-07-24 LAB — NOVEL CORONAVIRUS, NAA: SARS-CoV-2, NAA: NOT DETECTED

## 2018-08-19 ENCOUNTER — Ambulatory Visit
Admission: EM | Admit: 2018-08-19 | Discharge: 2018-08-19 | Disposition: A | Discharge status: Home / Self Care | Payer: Medicare HMO

## 2018-09-15 DIAGNOSIS — Z79899 Other long term (current) drug therapy: Secondary | ICD-10-CM | POA: Diagnosis not present

## 2018-09-15 DIAGNOSIS — B390 Acute pulmonary histoplasmosis capsulati: Secondary | ICD-10-CM | POA: Diagnosis not present

## 2018-09-15 DIAGNOSIS — N390 Urinary tract infection, site not specified: Secondary | ICD-10-CM | POA: Diagnosis not present

## 2018-09-15 DIAGNOSIS — M25552 Pain in left hip: Secondary | ICD-10-CM | POA: Diagnosis not present

## 2018-09-15 DIAGNOSIS — R300 Dysuria: Secondary | ICD-10-CM | POA: Diagnosis not present

## 2018-09-15 DIAGNOSIS — T300 Burn of unspecified body region, unspecified degree: Secondary | ICD-10-CM | POA: Diagnosis not present

## 2018-09-15 DIAGNOSIS — Z1159 Encounter for screening for other viral diseases: Secondary | ICD-10-CM | POA: Diagnosis not present

## 2018-09-15 DIAGNOSIS — M5416 Radiculopathy, lumbar region: Secondary | ICD-10-CM | POA: Diagnosis not present

## 2018-09-15 DIAGNOSIS — E119 Type 2 diabetes mellitus without complications: Secondary | ICD-10-CM | POA: Diagnosis not present

## 2018-09-15 DIAGNOSIS — M25551 Pain in right hip: Secondary | ICD-10-CM | POA: Diagnosis not present

## 2018-09-21 DIAGNOSIS — M25552 Pain in left hip: Secondary | ICD-10-CM | POA: Diagnosis not present

## 2018-09-21 DIAGNOSIS — M5416 Radiculopathy, lumbar region: Secondary | ICD-10-CM | POA: Diagnosis not present

## 2018-09-21 DIAGNOSIS — M25551 Pain in right hip: Secondary | ICD-10-CM | POA: Diagnosis not present

## 2018-09-27 DIAGNOSIS — Z20828 Contact with and (suspected) exposure to other viral communicable diseases: Secondary | ICD-10-CM | POA: Diagnosis not present

## 2018-09-29 DIAGNOSIS — N390 Urinary tract infection, site not specified: Secondary | ICD-10-CM | POA: Diagnosis not present

## 2018-09-29 DIAGNOSIS — M25559 Pain in unspecified hip: Secondary | ICD-10-CM | POA: Diagnosis not present

## 2018-09-29 DIAGNOSIS — I10 Essential (primary) hypertension: Secondary | ICD-10-CM | POA: Diagnosis not present

## 2018-09-29 DIAGNOSIS — E871 Hypo-osmolality and hyponatremia: Secondary | ICD-10-CM | POA: Diagnosis not present

## 2018-10-06 DIAGNOSIS — Z113 Encounter for screening for infections with a predominantly sexual mode of transmission: Secondary | ICD-10-CM | POA: Diagnosis not present

## 2018-10-06 DIAGNOSIS — R5383 Other fatigue: Secondary | ICD-10-CM | POA: Diagnosis not present

## 2018-10-06 DIAGNOSIS — Z114 Encounter for screening for human immunodeficiency virus [HIV]: Secondary | ICD-10-CM | POA: Diagnosis not present

## 2018-10-06 DIAGNOSIS — E559 Vitamin D deficiency, unspecified: Secondary | ICD-10-CM | POA: Diagnosis not present

## 2018-10-06 DIAGNOSIS — E7800 Pure hypercholesterolemia, unspecified: Secondary | ICD-10-CM | POA: Diagnosis not present

## 2018-10-06 DIAGNOSIS — Z1331 Encounter for screening for depression: Secondary | ICD-10-CM | POA: Diagnosis not present

## 2018-10-06 DIAGNOSIS — E1165 Type 2 diabetes mellitus with hyperglycemia: Secondary | ICD-10-CM | POA: Diagnosis not present

## 2018-10-06 DIAGNOSIS — R0602 Shortness of breath: Secondary | ICD-10-CM | POA: Diagnosis not present

## 2018-10-06 DIAGNOSIS — Z0000 Encounter for general adult medical examination without abnormal findings: Secondary | ICD-10-CM | POA: Diagnosis not present

## 2018-10-06 DIAGNOSIS — N390 Urinary tract infection, site not specified: Secondary | ICD-10-CM | POA: Diagnosis not present

## 2018-10-06 DIAGNOSIS — Z1339 Encounter for screening examination for other mental health and behavioral disorders: Secondary | ICD-10-CM | POA: Diagnosis not present

## 2018-10-06 DIAGNOSIS — Z1159 Encounter for screening for other viral diseases: Secondary | ICD-10-CM | POA: Diagnosis not present

## 2018-10-11 ENCOUNTER — Other Ambulatory Visit: Payer: Self-pay | Admitting: Physician Assistant

## 2018-10-11 DIAGNOSIS — Z1231 Encounter for screening mammogram for malignant neoplasm of breast: Secondary | ICD-10-CM

## 2018-10-13 DIAGNOSIS — J849 Interstitial pulmonary disease, unspecified: Secondary | ICD-10-CM | POA: Diagnosis not present

## 2018-10-13 DIAGNOSIS — M2550 Pain in unspecified joint: Secondary | ICD-10-CM | POA: Diagnosis not present

## 2018-10-13 DIAGNOSIS — Z79899 Other long term (current) drug therapy: Secondary | ICD-10-CM | POA: Diagnosis not present

## 2018-10-13 DIAGNOSIS — M3219 Other organ or system involvement in systemic lupus erythematosus: Secondary | ICD-10-CM | POA: Diagnosis not present

## 2018-10-13 DIAGNOSIS — N183 Chronic kidney disease, stage 3 (moderate): Secondary | ICD-10-CM | POA: Diagnosis not present

## 2018-10-13 DIAGNOSIS — E663 Overweight: Secondary | ICD-10-CM | POA: Diagnosis not present

## 2018-10-13 DIAGNOSIS — Z6825 Body mass index (BMI) 25.0-25.9, adult: Secondary | ICD-10-CM | POA: Diagnosis not present

## 2018-10-13 DIAGNOSIS — M150 Primary generalized (osteo)arthritis: Secondary | ICD-10-CM | POA: Diagnosis not present

## 2018-10-27 DIAGNOSIS — E559 Vitamin D deficiency, unspecified: Secondary | ICD-10-CM | POA: Diagnosis not present

## 2018-10-27 DIAGNOSIS — E1165 Type 2 diabetes mellitus with hyperglycemia: Secondary | ICD-10-CM | POA: Diagnosis not present

## 2018-10-27 DIAGNOSIS — Z712 Person consulting for explanation of examination or test findings: Secondary | ICD-10-CM | POA: Diagnosis not present

## 2018-11-23 DIAGNOSIS — Z780 Asymptomatic menopausal state: Secondary | ICD-10-CM | POA: Diagnosis not present

## 2018-12-15 DIAGNOSIS — B351 Tinea unguium: Secondary | ICD-10-CM | POA: Diagnosis not present

## 2018-12-15 DIAGNOSIS — E1165 Type 2 diabetes mellitus with hyperglycemia: Secondary | ICD-10-CM | POA: Diagnosis not present

## 2018-12-15 DIAGNOSIS — M79674 Pain in right toe(s): Secondary | ICD-10-CM | POA: Diagnosis not present

## 2018-12-15 DIAGNOSIS — M79675 Pain in left toe(s): Secondary | ICD-10-CM | POA: Diagnosis not present

## 2019-02-17 ENCOUNTER — Inpatient Hospital Stay (HOSPITAL_COMMUNITY)
Admission: EM | Admit: 2019-02-17 | Discharge: 2019-02-20 | DRG: 690 | Disposition: A | Discharge status: Home / Self Care | Payer: Medicare Other | Attending: Family Medicine | Admitting: Family Medicine

## 2019-02-17 ENCOUNTER — Emergency Department (HOSPITAL_COMMUNITY): Payer: Medicare Other

## 2019-02-17 ENCOUNTER — Other Ambulatory Visit: Payer: Self-pay

## 2019-02-17 ENCOUNTER — Encounter (HOSPITAL_COMMUNITY): Payer: Self-pay

## 2019-02-17 DIAGNOSIS — G44319 Acute post-traumatic headache, not intractable: Secondary | ICD-10-CM

## 2019-02-17 DIAGNOSIS — E039 Hypothyroidism, unspecified: Secondary | ICD-10-CM | POA: Diagnosis present

## 2019-02-17 DIAGNOSIS — Z79890 Hormone replacement therapy (postmenopausal): Secondary | ICD-10-CM

## 2019-02-17 DIAGNOSIS — E11649 Type 2 diabetes mellitus with hypoglycemia without coma: Secondary | ICD-10-CM | POA: Diagnosis present

## 2019-02-17 DIAGNOSIS — I10 Essential (primary) hypertension: Secondary | ICD-10-CM | POA: Diagnosis present

## 2019-02-17 DIAGNOSIS — I348 Other nonrheumatic mitral valve disorders: Secondary | ICD-10-CM | POA: Diagnosis present

## 2019-02-17 DIAGNOSIS — Z853 Personal history of malignant neoplasm of breast: Secondary | ICD-10-CM

## 2019-02-17 DIAGNOSIS — Y9301 Activity, walking, marching and hiking: Secondary | ICD-10-CM | POA: Diagnosis present

## 2019-02-17 DIAGNOSIS — I129 Hypertensive chronic kidney disease with stage 1 through stage 4 chronic kidney disease, or unspecified chronic kidney disease: Secondary | ICD-10-CM | POA: Diagnosis present

## 2019-02-17 DIAGNOSIS — R55 Syncope and collapse: Secondary | ICD-10-CM | POA: Diagnosis present

## 2019-02-17 DIAGNOSIS — N3000 Acute cystitis without hematuria: Secondary | ICD-10-CM | POA: Diagnosis not present

## 2019-02-17 DIAGNOSIS — I38 Endocarditis, valve unspecified: Secondary | ICD-10-CM | POA: Diagnosis present

## 2019-02-17 DIAGNOSIS — E871 Hypo-osmolality and hyponatremia: Secondary | ICD-10-CM | POA: Diagnosis present

## 2019-02-17 DIAGNOSIS — J8410 Pulmonary fibrosis, unspecified: Secondary | ICD-10-CM | POA: Diagnosis present

## 2019-02-17 DIAGNOSIS — W19XXXA Unspecified fall, initial encounter: Secondary | ICD-10-CM | POA: Diagnosis present

## 2019-02-17 DIAGNOSIS — I2510 Atherosclerotic heart disease of native coronary artery without angina pectoris: Secondary | ICD-10-CM | POA: Diagnosis present

## 2019-02-17 DIAGNOSIS — Z20822 Contact with and (suspected) exposure to covid-19: Secondary | ICD-10-CM | POA: Diagnosis present

## 2019-02-17 DIAGNOSIS — N1831 Chronic kidney disease, stage 3a: Secondary | ICD-10-CM | POA: Diagnosis present

## 2019-02-17 DIAGNOSIS — Z87891 Personal history of nicotine dependence: Secondary | ICD-10-CM

## 2019-02-17 DIAGNOSIS — E1140 Type 2 diabetes mellitus with diabetic neuropathy, unspecified: Secondary | ICD-10-CM | POA: Diagnosis present

## 2019-02-17 DIAGNOSIS — Z888 Allergy status to other drugs, medicaments and biological substances status: Secondary | ICD-10-CM

## 2019-02-17 DIAGNOSIS — E1122 Type 2 diabetes mellitus with diabetic chronic kidney disease: Secondary | ICD-10-CM | POA: Diagnosis present

## 2019-02-17 DIAGNOSIS — Z79899 Other long term (current) drug therapy: Secondary | ICD-10-CM

## 2019-02-17 DIAGNOSIS — IMO0002 Reserved for concepts with insufficient information to code with codable children: Secondary | ICD-10-CM | POA: Diagnosis present

## 2019-02-17 DIAGNOSIS — N179 Acute kidney failure, unspecified: Secondary | ICD-10-CM | POA: Diagnosis present

## 2019-02-17 DIAGNOSIS — M329 Systemic lupus erythematosus, unspecified: Secondary | ICD-10-CM | POA: Diagnosis present

## 2019-02-17 DIAGNOSIS — R0602 Shortness of breath: Secondary | ICD-10-CM | POA: Diagnosis present

## 2019-02-17 DIAGNOSIS — E860 Dehydration: Secondary | ICD-10-CM | POA: Diagnosis present

## 2019-02-17 DIAGNOSIS — Z809 Family history of malignant neoplasm, unspecified: Secondary | ICD-10-CM

## 2019-02-17 LAB — CBC WITH DIFFERENTIAL/PLATELET
Abs Immature Granulocytes: 0.02 10*3/uL (ref 0.00–0.07)
Basophils Absolute: 0 10*3/uL (ref 0.0–0.1)
Basophils Relative: 0 %
Eosinophils Absolute: 0.1 10*3/uL (ref 0.0–0.5)
Eosinophils Relative: 1 %
HCT: 37.5 % (ref 36.0–46.0)
Hemoglobin: 12.7 g/dL (ref 12.0–15.0)
Immature Granulocytes: 0 %
Lymphocytes Relative: 27 %
Lymphs Abs: 1.5 10*3/uL (ref 0.7–4.0)
MCH: 29.8 pg (ref 26.0–34.0)
MCHC: 33.9 g/dL (ref 30.0–36.0)
MCV: 88 fL (ref 80.0–100.0)
Monocytes Absolute: 0.3 10*3/uL (ref 0.1–1.0)
Monocytes Relative: 6 %
Neutro Abs: 3.7 10*3/uL (ref 1.7–7.7)
Neutrophils Relative %: 66 %
Platelets: 238 10*3/uL (ref 150–400)
RBC: 4.26 MIL/uL (ref 3.87–5.11)
RDW: 12.9 % (ref 11.5–15.5)
WBC: 5.7 10*3/uL (ref 4.0–10.5)
nRBC: 0 % (ref 0.0–0.2)

## 2019-02-17 LAB — BASIC METABOLIC PANEL
Anion gap: 10 (ref 5–15)
BUN: 13 mg/dL (ref 8–23)
CO2: 23 mmol/L (ref 22–32)
Calcium: 9.1 mg/dL (ref 8.9–10.3)
Chloride: 88 mmol/L — ABNORMAL LOW (ref 98–111)
Creatinine, Ser: 1.34 mg/dL — ABNORMAL HIGH (ref 0.44–1.00)
GFR calc Af Amer: 44 mL/min — ABNORMAL LOW (ref >60)
GFR calc non Af Amer: 38 mL/min — ABNORMAL LOW (ref >60)
Glucose, Bld: 63 mg/dL — ABNORMAL LOW (ref 70–99)
Potassium: 3.6 mmol/L (ref 3.5–5.1)
Sodium: 121 mmol/L — ABNORMAL LOW (ref 135–145)

## 2019-02-17 MED ORDER — ACETAMINOPHEN 500 MG PO TABS
1000.0000 mg | ORAL_TABLET | Freq: Once | ORAL | Status: AC
  Administered 2019-02-17: 1000 mg via ORAL
  Filled 2019-02-17: qty 2

## 2019-02-17 MED ORDER — SODIUM CHLORIDE 0.9 % IV BOLUS
500.0000 mL | Freq: Once | INTRAVENOUS | Status: AC
  Administered 2019-02-18: 500 mL via INTRAVENOUS

## 2019-02-17 NOTE — ED Provider Notes (Signed)
[facility] EMERGENCY DEPARTMENT Provider Note   CSN: [id number] Arrival date & time: 02/17/19  2059     History Chief Complaint  Patient presents with  . Headache    Kenijah Sawdon is a 79 y.o. female.  Patient is a 79 year old female who presents with a headache.  She states that yesterday she had a dizzy spell when she was walking to the bathroom and she sat down on the toilet and felt a little better so she stood up and got dizzy again and she had a near syncopal event where she fell backward landing on her back and striking her head on the floor.  She said that she was doing okay throughout the day but then this evening she started having a significant headache in her left posterior head where she hit it yesterday.  The pain radiates to her left frontal scalp area and around her left eye.  She denies any eye pain.  No vision changes.  She has not had any dizziness today.  She has had a couple of other similar type dizzy spells over the last couple weeks but none that is been persistent.  She does not report any vertiginous type symptoms.  She says it is more when she stands up.  She denies any chest pain or shortness of breath.  No palpitations.  No fevers or other recent illnesses.  She said she felt a little fatigued today but not really significantly different than her day-to-day health.  She denies any other injuries from the fall yesterday.  She has not taken anything at home for the pain.        Past Medical History:  Diagnosis Date  . Breast cancer ([place])   . Diabetes mellitus without complication ([place])   . Hypertension   . Hypothyroidism   . Lupus [facility])     Patient Active Problem List   Diagnosis Date Noted  . ILD (interstitial lung disease) ([place]) 07/30/2016  . Dyspnea 07/16/2016  . Bibasilar crackles 07/16/2016  . History of systemic lupus erythematosus (SLE) ([place]) 07/16/2016  . Cough 07/16/2016  . Encounter for monitoring ACE-inhibitor therapy  07/16/2016  . History of asthma 07/16/2016  . Fever, unspecified   . Hypothyroidism 01/03/2015  . HTN (hypertension) 01/03/2015  . UTI (urinary tract infection) 01/03/2015  . Drug-induced hypersensitivity reaction 01/03/2015  . Lupus ([place]) 01/03/2015  . Pyrexia 01/02/2015    Past Surgical History:  Procedure Laterality Date  . BREAST LUMPECTOMY  2009     OB History   No obstetric history on file.     Family History  Problem Relation Age of Onset  . Cancer Father        ? type    Social History   Tobacco Use  . Smoking status: Never Smoker  . Smokeless tobacco: Former Systems developer    Types: Snuff  Substance Use Topics  . Alcohol use: No  . Drug use: No    Home Medications Prior to Admission medications   Medication Sig Start Date End Date Taking? Authorizing Provider  ADVAIR DISKUS 100-50 MCG/DOSE AEPB Inhale 2 puffs into the lungs daily. 04/17/16   [provider]  gabapentin (NEURONTIN) 300 MG capsule Take 300 mg by mouth 2 (two) times daily.    [provider]  glimepiride (AMARYL) 1 MG tablet Take 1 mg by mouth daily. 01/02/15   [provider]  hydroxychloroquine (PLAQUENIL) 200 MG tablet Take 1 tablet (200 mg total) by mouth daily. 01/06/15  Kelvin Cellar, MD  levothyroxine (SYNTHROID, LEVOTHROID) 75 MCG tablet  02/13/18   [provider]  losartan-hydrochlorothiazide (HYZAAR) 50-12.5 MG tablet TK 1 T PO  ONCE A DAY 01/29/17   [provider]  Multiple Vitamin (MULTIVITAMIN WITH MINERALS) TABS tablet Take 1 tablet by mouth daily.    [provider]  omeprazole (PRILOSEC) 20 MG capsule Take 20 mg by mouth daily.    [provider]  Tiotropium Bromide Monohydrate (SPIRIVA RESPIMAT) 1.25 MCG/ACT AERS Inhale 2 puffs into the lungs daily. 08/18/17   Brand Males, MD    Allergies    Ciprofloxacin  Review of Systems   Review of Systems  Constitutional: Negative for chills, diaphoresis, fatigue and fever.    HENT: Negative for congestion, rhinorrhea and sneezing.   Eyes: Negative.   Respiratory: Negative for cough, chest tightness and shortness of breath.   Cardiovascular: Negative for chest pain and leg swelling.  Gastrointestinal: Negative for abdominal pain, blood in stool, diarrhea, nausea and vomiting.  Genitourinary: Negative for difficulty urinating, flank pain, frequency and hematuria.  Musculoskeletal: Negative for arthralgias and back pain.  Skin: Negative for rash.  Neurological: Positive for syncope (near syncope) and headaches. Negative for dizziness, speech difficulty, weakness and numbness.    Physical Exam Updated Vital Signs BP (!) 156/71   Pulse 63   Temp 98.1 F (36.7 C) (Oral)   Resp 18   Ht 5\' 5"  (1.651 m)   Wt 69.9 kg   SpO2 98%   BMI 25.63 kg/m   Physical Exam Constitutional:      Appearance: She is well-developed.  HENT:     Head: Normocephalic and atraumatic.     Comments: Positive tenderness on palpation to the posterior scalp on the left side, no deformities are noted.  No wounds are noted. Eyes:     Pupils: Pupils are equal, round, and reactive to light.  Neck:     Comments: Some mild tenderness to the upper cervical spine, no step-offs or deformities, no pain to the thoracic or lumbosacral spine Cardiovascular:     Rate and Rhythm: Normal rate and regular rhythm.     Heart sounds: Normal heart sounds.  Pulmonary:     Effort: Pulmonary effort is normal. No respiratory distress.     Breath sounds: Normal breath sounds. No wheezing or rales.  Chest:     Chest wall: No tenderness.  Abdominal:     General: Bowel sounds are normal.     Palpations: Abdomen is soft.     Tenderness: There is no abdominal tenderness. There is no guarding or rebound.  Musculoskeletal:        General: Normal range of motion.  Lymphadenopathy:     Cervical: No cervical adenopathy.  Skin:    General: Skin is warm and dry.     Findings: No rash.  Neurological:      Mental Status: She is alert and oriented to person, place, and time.     Comments: Motor 5/5 all extremities Sensation grossly intact to LT all extremities Finger to Nose intact, no pronator drift CN II-XII grossly intact       ED Results / Procedures / Treatments   Labs (all labs ordered are listed, but only abnormal results are displayed) Labs Reviewed  BASIC METABOLIC PANEL - Abnormal; Notable for the following components:      Result Value   Sodium 121 (*)    Chloride 88 (*)    Glucose, Bld 63 (*)  Creatinine, Ser 1.34 (*)    GFR calc non Af Amer 38 (*)    GFR calc Af Amer 44 (*)    All other components within normal limits  CBC WITH DIFFERENTIAL/PLATELET  URINALYSIS, ROUTINE W REFLEX MICROSCOPIC    EKG EKG Interpretation  Date/Time:  Friday February 17 2019 22:15:19 EST Ventricular Rate:  61 PR Interval:    QRS Duration: 100 QT Interval:  450 QTC Calculation: 454 R Axis:   10 Text Interpretation: Sinus rhythm Borderline prolonged PR interval Low voltage, extremity and precordial leads Consider anterior infarct Nonspecific T abnormalities, lateral leads since last tracing no significant change Confirmed by Malvin Johns [phone]) on 02/17/2019 10:58:57 PM   Radiology CT Head Wo Contrast  Result Date: 02/17/2019 CLINICAL DATA:  79 year old female with fall. EXAM: CT HEAD WITHOUT CONTRAST CT CERVICAL SPINE WITHOUT CONTRAST TECHNIQUE: Multidetector CT imaging of the head and cervical spine was performed following the standard protocol without intravenous contrast. Multiplanar CT image reconstructions of the cervical spine were also generated. COMPARISON:  Head CT dated 06/20/2014. FINDINGS: CT HEAD FINDINGS Brain: The ventricles and sulci appropriate size for patient's age. Minimal periventricular and deep white matter chronic microvascular ischemic changes noted. There is no acute intracranial hemorrhage. No mass effect or midline shift. No extra-axial fluid collection.  Vascular: No hyperdense vessel or unexpected calcification. Skull: Normal. Negative for fracture or focal lesion. Sinuses/Orbits: No acute finding. Other: None CT CERVICAL SPINE FINDINGS Alignment: No acute subluxation. There is straightening of normal cervical lordosis which may be positional or due to muscle spasm. There is grade 1 C3-C4 retrolisthesis. Skull base and vertebrae: No acute fracture. No primary bone lesion or focal pathologic process. Soft tissues and spinal canal: No prevertebral fluid or swelling. No visible canal hematoma. Disc levels: Multilevel degenerative changes. There is a large disc osteophyte complex at C4-C5 with mild narrowing of the central canal and moderate bilateral neural foramina narrowing. Upper chest: Negative. Other: Advanced left carotid bulb calcified plaque. IMPRESSION: 1. No acute intracranial pathology. 2. No acute/traumatic cervical spine pathology. Multilevel degenerative changes most prominent at C4-C5. Electronically Signed   By: Anner Crete M.D.   On: 02/17/2019 21:46   CT Cervical Spine Wo Contrast  Result Date: 02/17/2019 CLINICAL DATA:  79 year old female with fall. EXAM: CT HEAD WITHOUT CONTRAST CT CERVICAL SPINE WITHOUT CONTRAST TECHNIQUE: Multidetector CT imaging of the head and cervical spine was performed following the standard protocol without intravenous contrast. Multiplanar CT image reconstructions of the cervical spine were also generated. COMPARISON:  Head CT dated 06/20/2014. FINDINGS: CT HEAD FINDINGS Brain: The ventricles and sulci appropriate size for patient's age. Minimal periventricular and deep white matter chronic microvascular ischemic changes noted. There is no acute intracranial hemorrhage. No mass effect or midline shift. No extra-axial fluid collection. Vascular: No hyperdense vessel or unexpected calcification. Skull: Normal. Negative for fracture or focal lesion. Sinuses/Orbits: No acute finding. Other: None CT CERVICAL SPINE  FINDINGS Alignment: No acute subluxation. There is straightening of normal cervical lordosis which may be positional or due to muscle spasm. There is grade 1 C3-C4 retrolisthesis. Skull base and vertebrae: No acute fracture. No primary bone lesion or focal pathologic process. Soft tissues and spinal canal: No prevertebral fluid or swelling. No visible canal hematoma. Disc levels: Multilevel degenerative changes. There is a large disc osteophyte complex at C4-C5 with mild narrowing of the central canal and moderate bilateral neural foramina narrowing. Upper chest: Negative. Other: Advanced left carotid bulb calcified plaque. IMPRESSION: 1. No  acute intracranial pathology. 2. No acute/traumatic cervical spine pathology. Multilevel degenerative changes most prominent at C4-C5. Electronically Signed   By: Anner Crete M.D.   On: 02/17/2019 21:46    Procedures Procedures (including critical care time)  Medications Ordered in ED Medications  sodium chloride 0.9 % bolus 500 mL (has no administration in time range)  acetaminophen (TYLENOL) tablet 1,000 mg (1,000 mg Oral Given 02/17/19 2217)    ED Course  I have reviewed the triage vital signs and the nursing notes.  Pertinent labs & imaging results that were available during my care of the patient were reviewed by me and considered in my medical decision making (see chart for details).    MDM Rules/Calculators/A&P                      Patient presents with a headache after a fall that happened yesterday.  The fall was related to a near syncopal event.  She is neurologically intact.  She had a head CT as well as a CT of her C-spine which shows no acute abnormality.  Her labs do show a markedly low sodium at 121.  She has had some low sodiums in the past but it is been mild in the 1 34-1 35 range.  She has mild elevation in her creatinine which seems to be baseline.  Urinalysis is still pending.  She will need to be admitted for further treatment of  her hyponatremia.  Her granddaughter was updated. Final Clinical Impression(s) / ED Diagnoses Final diagnoses:  Near syncope  Hyponatremia  Acute post-traumatic headache, not intractable    Rx / DC Orders ED Discharge Orders    None       Malvin Johns, MD 02/18/19 0002

## 2019-02-17 NOTE — ED Triage Notes (Signed)
Pt BIB [facility] for eval of  Headache onset 30 mins PTA radiating to L eye. No visual changes or neuro deficits. EMS reports improved on arrival. Pt reported dizziness yesterday, did fall and strike head yesterday, No LOC. Denies vomiting or nausea. Pt is +DM. GCS 15, neuro intact.

## 2019-02-17 NOTE — ED Notes (Signed)
[phone] Granddaughter, Lamar Sprinkles please update as needed Also call for ride when pt is being transported

## 2019-02-17 NOTE — ED Provider Notes (Signed)
12:00 AM  Assumed care from Dr. Tamera Punt.  Patient is a 79 yo F with h/o HTN, hypothyroidism, DM, lupus on plaquenil who presents to ED with HA after fall yesterday.  Patient was lightheaded and had near syncopal event yesterday and fell backward and hit head.  Felt better today and then later developed headache.  CT head and c spine showed no acute abnormality.  HA improving with Tylenol.  Labs show sodium of 121.  Patient is on hydrochlorothiazide.  IVF have been ordered.  I have been asked to take phone call for admission to medicine.  Discussed patient's case with hospitalist, Dr. Maudie Mercury.  I have recommended admission and patient (and family if present) agree with this plan. Admitting physician will place admission orders.   I reviewed all nursing notes, vitals, pertinent previous records and interpreted all EKGs, lab and urine results, imaging (as available).     Jennine Peddy, Delice Bison, DO 02/18/19 [phone]

## 2019-02-18 ENCOUNTER — Observation Stay (HOSPITAL_BASED_OUTPATIENT_CLINIC_OR_DEPARTMENT_OTHER): Payer: Medicare Other

## 2019-02-18 ENCOUNTER — Other Ambulatory Visit: Payer: Self-pay

## 2019-02-18 DIAGNOSIS — I2510 Atherosclerotic heart disease of native coronary artery without angina pectoris: Secondary | ICD-10-CM | POA: Diagnosis present

## 2019-02-18 DIAGNOSIS — E1140 Type 2 diabetes mellitus with diabetic neuropathy, unspecified: Secondary | ICD-10-CM | POA: Diagnosis present

## 2019-02-18 DIAGNOSIS — I38 Endocarditis, valve unspecified: Secondary | ICD-10-CM | POA: Diagnosis present

## 2019-02-18 DIAGNOSIS — E860 Dehydration: Secondary | ICD-10-CM | POA: Diagnosis present

## 2019-02-18 DIAGNOSIS — I129 Hypertensive chronic kidney disease with stage 1 through stage 4 chronic kidney disease, or unspecified chronic kidney disease: Secondary | ICD-10-CM | POA: Diagnosis present

## 2019-02-18 DIAGNOSIS — I371 Nonrheumatic pulmonary valve insufficiency: Secondary | ICD-10-CM | POA: Diagnosis not present

## 2019-02-18 DIAGNOSIS — Z79890 Hormone replacement therapy (postmenopausal): Secondary | ICD-10-CM | POA: Diagnosis not present

## 2019-02-18 DIAGNOSIS — Z888 Allergy status to other drugs, medicaments and biological substances status: Secondary | ICD-10-CM | POA: Diagnosis not present

## 2019-02-18 DIAGNOSIS — I348 Other nonrheumatic mitral valve disorders: Secondary | ICD-10-CM | POA: Diagnosis present

## 2019-02-18 DIAGNOSIS — M329 Systemic lupus erythematosus, unspecified: Secondary | ICD-10-CM | POA: Diagnosis present

## 2019-02-18 DIAGNOSIS — R55 Syncope and collapse: Secondary | ICD-10-CM | POA: Diagnosis present

## 2019-02-18 DIAGNOSIS — N3000 Acute cystitis without hematuria: Secondary | ICD-10-CM | POA: Diagnosis present

## 2019-02-18 DIAGNOSIS — R0602 Shortness of breath: Secondary | ICD-10-CM | POA: Diagnosis present

## 2019-02-18 DIAGNOSIS — Z809 Family history of malignant neoplasm, unspecified: Secondary | ICD-10-CM | POA: Diagnosis not present

## 2019-02-18 DIAGNOSIS — J8410 Pulmonary fibrosis, unspecified: Secondary | ICD-10-CM | POA: Diagnosis present

## 2019-02-18 DIAGNOSIS — G44319 Acute post-traumatic headache, not intractable: Secondary | ICD-10-CM | POA: Diagnosis present

## 2019-02-18 DIAGNOSIS — Z87891 Personal history of nicotine dependence: Secondary | ICD-10-CM | POA: Diagnosis not present

## 2019-02-18 DIAGNOSIS — E1122 Type 2 diabetes mellitus with diabetic chronic kidney disease: Secondary | ICD-10-CM | POA: Diagnosis present

## 2019-02-18 DIAGNOSIS — E871 Hypo-osmolality and hyponatremia: Secondary | ICD-10-CM | POA: Diagnosis present

## 2019-02-18 DIAGNOSIS — E039 Hypothyroidism, unspecified: Secondary | ICD-10-CM | POA: Diagnosis present

## 2019-02-18 DIAGNOSIS — I361 Nonrheumatic tricuspid (valve) insufficiency: Secondary | ICD-10-CM

## 2019-02-18 DIAGNOSIS — Y9301 Activity, walking, marching and hiking: Secondary | ICD-10-CM | POA: Diagnosis present

## 2019-02-18 DIAGNOSIS — N179 Acute kidney failure, unspecified: Secondary | ICD-10-CM | POA: Diagnosis present

## 2019-02-18 DIAGNOSIS — Z20822 Contact with and (suspected) exposure to covid-19: Secondary | ICD-10-CM | POA: Diagnosis present

## 2019-02-18 DIAGNOSIS — Z853 Personal history of malignant neoplasm of breast: Secondary | ICD-10-CM | POA: Diagnosis not present

## 2019-02-18 DIAGNOSIS — I10 Essential (primary) hypertension: Secondary | ICD-10-CM | POA: Diagnosis not present

## 2019-02-18 DIAGNOSIS — Z79899 Other long term (current) drug therapy: Secondary | ICD-10-CM | POA: Diagnosis not present

## 2019-02-18 DIAGNOSIS — E11649 Type 2 diabetes mellitus with hypoglycemia without coma: Secondary | ICD-10-CM | POA: Diagnosis present

## 2019-02-18 DIAGNOSIS — W19XXXA Unspecified fall, initial encounter: Secondary | ICD-10-CM | POA: Diagnosis present

## 2019-02-18 LAB — CBC
HCT: 36.8 % (ref 36.0–46.0)
Hemoglobin: 12.2 g/dL (ref 12.0–15.0)
MCH: 29.5 pg (ref 26.0–34.0)
MCHC: 33.2 g/dL (ref 30.0–36.0)
MCV: 88.9 fL (ref 80.0–100.0)
Platelets: 225 10*3/uL (ref 150–400)
RBC: 4.14 MIL/uL (ref 3.87–5.11)
RDW: 13 % (ref 11.5–15.5)
WBC: 5.7 10*3/uL (ref 4.0–10.5)
nRBC: 0 % (ref 0.0–0.2)

## 2019-02-18 LAB — URINALYSIS, ROUTINE W REFLEX MICROSCOPIC
Bilirubin Urine: NEGATIVE
Glucose, UA: NEGATIVE mg/dL
Hgb urine dipstick: NEGATIVE
Ketones, ur: NEGATIVE mg/dL
Nitrite: NEGATIVE
Protein, ur: NEGATIVE mg/dL
Specific Gravity, Urine: 1.008 (ref 1.005–1.030)
pH: 5 (ref 5.0–8.0)

## 2019-02-18 LAB — CBG MONITORING, ED
Glucose-Capillary: 31 mg/dL — CL (ref 70–99)
Glucose-Capillary: 38 mg/dL — CL (ref 70–99)
Glucose-Capillary: 119 mg/dL — ABNORMAL HIGH (ref 70–99)
Glucose-Capillary: 30 mg/dL — CL (ref 70–99)

## 2019-02-18 LAB — COMPREHENSIVE METABOLIC PANEL
ALT: 19 U/L (ref 0–44)
AST: 27 U/L (ref 15–41)
Albumin: 3.8 g/dL (ref 3.5–5.0)
Alkaline Phosphatase: 57 U/L (ref 38–126)
Anion gap: 10 (ref 5–15)
BUN: 13 mg/dL (ref 8–23)
CO2: 22 mmol/L (ref 22–32)
Calcium: 9.2 mg/dL (ref 8.9–10.3)
Chloride: 94 mmol/L — ABNORMAL LOW (ref 98–111)
Creatinine, Ser: 1.23 mg/dL — ABNORMAL HIGH (ref 0.44–1.00)
GFR calc Af Amer: 49 mL/min — ABNORMAL LOW (ref >60)
GFR calc non Af Amer: 43 mL/min — ABNORMAL LOW (ref >60)
Glucose, Bld: 50 mg/dL — ABNORMAL LOW (ref 70–99)
Potassium: 4.4 mmol/L (ref 3.5–5.1)
Sodium: 126 mmol/L — ABNORMAL LOW (ref 135–145)
Total Bilirubin: 0.3 mg/dL (ref 0.3–1.2)
Total Protein: 8 g/dL (ref 6.5–8.1)

## 2019-02-18 LAB — GLUCOSE, CAPILLARY
Glucose-Capillary: 153 mg/dL — ABNORMAL HIGH (ref 70–99)
Glucose-Capillary: 90 mg/dL (ref 70–99)
Glucose-Capillary: 130 mg/dL — ABNORMAL HIGH (ref 70–99)
Glucose-Capillary: 162 mg/dL — ABNORMAL HIGH (ref 70–99)

## 2019-02-18 LAB — OSMOLALITY, URINE: Osmolality, Ur: 273 mOsm/kg — ABNORMAL LOW (ref 300–900)

## 2019-02-18 LAB — SODIUM, URINE, RANDOM: Sodium, Ur: 40 mmol/L

## 2019-02-18 LAB — OSMOLALITY: Osmolality: 261 mOsm/kg — ABNORMAL LOW (ref 275–295)

## 2019-02-18 LAB — SARS CORONAVIRUS 2 (TAT 6-24 HRS): SARS Coronavirus 2: NEGATIVE

## 2019-02-18 LAB — ECHOCARDIOGRAM COMPLETE
Height: 65 in
Weight: 2483.26 oz

## 2019-02-18 LAB — CORTISOL: Cortisol, Plasma: 8.3 ug/dL

## 2019-02-18 LAB — TSH: TSH: 4.495 u[IU]/mL (ref 0.350–4.500)

## 2019-02-18 LAB — TROPONIN I (HIGH SENSITIVITY): Troponin I (High Sensitivity): 4 ng/L (ref <18)

## 2019-02-18 MED ORDER — LEVOTHYROXINE SODIUM 75 MCG PO TABS
75.0000 ug | ORAL_TABLET | Freq: Every day | ORAL | Status: DC
  Administered 2019-02-19 – 2019-02-20 (×2): 75 ug via ORAL
  Filled 2019-02-18 (×2): qty 1

## 2019-02-18 MED ORDER — INSULIN ASPART 100 UNIT/ML ~~LOC~~ SOLN: 0.0000 [IU] | Freq: Every day | SUBCUTANEOUS | Status: DC

## 2019-02-18 MED ORDER — GLIMEPIRIDE 2 MG PO TABS
2.0000 mg | ORAL_TABLET | Freq: Every day | ORAL | Status: DC
  Administered 2019-02-19 – 2019-02-20 (×2): 2 mg via ORAL
  Filled 2019-02-18 (×4): qty 1

## 2019-02-18 MED ORDER — DEXTROSE-NACL 5-0.9 % IV SOLN: INTRAVENOUS | Status: AC

## 2019-02-18 MED ORDER — UMECLIDINIUM BROMIDE 62.5 MCG/INH IN AEPB
1.0000 | INHALATION_SPRAY | Freq: Every day | RESPIRATORY_TRACT | Status: DC
  Administered 2019-02-19 – 2019-02-20 (×2): 1 via RESPIRATORY_TRACT
  Filled 2019-02-18: qty 7

## 2019-02-18 MED ORDER — SODIUM CHLORIDE 0.9 % IV SOLN: INTRAVENOUS | Status: DC

## 2019-02-18 MED ORDER — PANTOPRAZOLE SODIUM 40 MG PO TBEC
40.0000 mg | DELAYED_RELEASE_TABLET | Freq: Every day | ORAL | Status: DC
  Administered 2019-02-18: 40 mg via ORAL
  Filled 2019-02-18 (×3): qty 1

## 2019-02-18 MED ORDER — DEXTROSE 50 % IV SOLN
INTRAVENOUS | Status: AC
  Administered 2019-02-18: 50 mL via INTRAVENOUS
  Filled 2019-02-18: qty 50

## 2019-02-18 MED ORDER — HYDROXYCHLOROQUINE SULFATE 200 MG PO TABS
200.0000 mg | ORAL_TABLET | Freq: Every day | ORAL | Status: DC
  Administered 2019-02-18 – 2019-02-20 (×3): 200 mg via ORAL
  Filled 2019-02-18 (×3): qty 1

## 2019-02-18 MED ORDER — SODIUM CHLORIDE 0.9 % IV SOLN
1.0000 g | INTRAVENOUS | Status: DC
  Administered 2019-02-19 (×2): 1 g via INTRAVENOUS
  Filled 2019-02-18 (×2): qty 10

## 2019-02-18 MED ORDER — ENOXAPARIN SODIUM 40 MG/0.4ML ~~LOC~~ SOLN
40.0000 mg | SUBCUTANEOUS | Status: DC
  Administered 2019-02-18 – 2019-02-19 (×2): 40 mg via SUBCUTANEOUS
  Filled 2019-02-18 (×2): qty 0.4

## 2019-02-18 MED ORDER — LOSARTAN POTASSIUM 50 MG PO TABS
50.0000 mg | ORAL_TABLET | Freq: Every day | ORAL | Status: DC
  Administered 2019-02-18 – 2019-02-20 (×3): 50 mg via ORAL
  Filled 2019-02-18 (×3): qty 1

## 2019-02-18 MED ORDER — ACETAMINOPHEN 650 MG RE SUPP: 650.0000 mg | Freq: Four times a day (QID) | RECTAL | Status: DC | PRN

## 2019-02-18 MED ORDER — SODIUM CHLORIDE 0.9 % IV SOLN
1.0000 g | Freq: Once | INTRAVENOUS | Status: AC
  Administered 2019-02-18: 1 g via INTRAVENOUS
  Filled 2019-02-18: qty 10

## 2019-02-18 MED ORDER — ACETAMINOPHEN 325 MG PO TABS: 650.0000 mg | ORAL_TABLET | Freq: Four times a day (QID) | ORAL | Status: DC | PRN

## 2019-02-18 MED ORDER — CAPSAICIN 0.025 % EX CREA: TOPICAL_CREAM | Freq: Three times a day (TID) | CUTANEOUS | Status: DC | PRN

## 2019-02-18 MED ORDER — INSULIN ASPART 100 UNIT/ML ~~LOC~~ SOLN
0.0000 [IU] | Freq: Three times a day (TID) | SUBCUTANEOUS | Status: DC
  Administered 2019-02-18: 2 [IU] via SUBCUTANEOUS
  Administered 2019-02-18 – 2019-02-20 (×2): 1 [IU] via SUBCUTANEOUS

## 2019-02-18 MED ORDER — DEXTROSE 50 % IV SOLN: 25.0000 g | Freq: Once | INTRAVENOUS | Status: AC

## 2019-02-18 MED ORDER — GABAPENTIN 300 MG PO CAPS
300.0000 mg | ORAL_CAPSULE | Freq: Two times a day (BID) | ORAL | Status: DC
  Administered 2019-02-18 – 2019-02-20 (×5): 300 mg via ORAL
  Filled 2019-02-18 (×5): qty 1

## 2019-02-18 NOTE — ED Notes (Signed)
Tele   Breakfast ordered  

## 2019-02-18 NOTE — ED Notes (Signed)
Admitting at bedside 

## 2019-02-18 NOTE — ED Notes (Signed)
Made aware of hypoglycemia tech. Per hypoglycemia sidebar pt able to take PO so given two orange juice and a sandwich. Pt a+Ox4 at this time

## 2019-02-18 NOTE — ED Notes (Signed)
Text/page midlevel regarding hypoglycemia

## 2019-02-18 NOTE — Progress Notes (Signed)
  Echocardiogram 2D Echocardiogram has been performed.  Jennette Dubin 02/18/2019, 9:13 AM

## 2019-02-18 NOTE — ED Notes (Signed)
Report attempted 

## 2019-02-18 NOTE — H&P (Addendum)
TRH H&P    Patient Demographics:    [name], is a 79 y.o. female  MRN: [id number]  DOB - 1940-03-01  Admit Date - 02/17/2019  Referring MD/NP/PA:  Lenard Forth Ward  Outpatient Primary MD for the patient is Osei-Bonsu, Iona Beard, MD  Patient coming from: home  Chief complaint- near syncope   HPI:    [name]  is a 79 y.o. female,  Hypothyroidism, Hypertension, Dm2, Lupus,  H/o breast cancer, presents due to near syncope,  Felt dizzy as she was walking from the bedroom to the bathroom pt denies headache other than the back of the head that she stuck on ground.  Pt denies cp, palp, sob, n/v, diarrhea, brbpr, black stool, dysuria, hematuria, focal weakness, numbness, tingling. Pt denies any recent medication change other than increase in glimepiride.    In ED  CT brain / c spine IMPRESSION: 1. No acute intracranial pathology. 2. No acute/traumatic cervical spine pathology. Multilevel degenerative changes most prominent at C4-C5.  Na 121, K 3.6,  Bun 13, Creatinine 1.34 Glucose 63 Wbc 5.7, Hgbn 12.7, Plt 238  Urine osm 273, urine sodium 40 Urinalysis wbc 11-20, rbc 0-5  Pt will be admitted for dizziness, near syncope, and acute lower uti and hyponatremia.       Review of systems:    In addition to the HPI above,  No Fever-chills, No Headache, No changes with Vision or hearing, No problems swallowing food or Liquids, No Chest pain, Cough or Shortness of Breath, No Abdominal pain, No Nausea or Vomiting, bowel movements are regular, No Blood in stool or Urine, No dysuria,  + slight frequency of urination.  No new skin rashes or bruises, No new joints pains-aches,  No new weakness, tingling, numbness in any extremity, No recent weight gain or loss, No polyuria, polydypsia or polyphagia, No significant Mental Stressors.  All other systems reviewed and are negative.    Past History of  the following :    Past Medical History:  Diagnosis Date  . Breast cancer ([place])   . Diabetes mellitus without complication ([place])   . Hypertension   . Hypothyroidism   . Lupus [facility])       Past Surgical History:  Procedure Laterality Date  . BREAST LUMPECTOMY  2009      Social History:      Social History   Tobacco Use  . Smoking status: Never Smoker  . Smokeless tobacco: Former Systems developer    Types: Snuff  Substance Use Topics  . Alcohol use: No       Family History :     Family History  Problem Relation Age of Onset  . Cancer Father        ? type       Home Medications:   Prior to Admission medications   Medication Sig Start Date End Date Taking? Authorizing Provider  capsaicin (ZOSTRIX) 0.025 % cream Apply topically 3 (three) times daily as needed (for pain).  09/16/18  Yes [provider]  gabapentin (NEURONTIN) 300 MG capsule Take  300 mg by mouth 2 (two) times daily.   Yes [provider]  glimepiride (AMARYL) 2 MG tablet Take 2 mg by mouth daily. 02/09/19  Yes [provider]  hydroxychloroquine (PLAQUENIL) 200 MG tablet Take 1 tablet (200 mg total) by mouth daily. 01/06/15  Yes Kelvin Cellar, MD  levothyroxine (SYNTHROID, LEVOTHROID) 75 MCG tablet Take 75 mcg by mouth daily before breakfast.  02/13/18  Yes [provider]  losartan-hydrochlorothiazide (HYZAAR) 50-12.5 MG tablet Take 1 tablet by mouth daily.  01/29/17  Yes [provider]  omeprazole (PRILOSEC) 20 MG capsule Take 20 mg by mouth daily.   Yes [provider]  Tiotropium Bromide Monohydrate (SPIRIVA RESPIMAT) 1.25 MCG/ACT AERS Inhale 2 puffs into the lungs daily. 08/18/17  Yes Brand Males, MD  Vitamin D, Ergocalciferol, (DRISDOL) 1.25 MG (50000 UNIT) CAPS capsule Take 50,000 Units by mouth every Sunday.  02/09/19  Yes [provider]     Allergies:     Allergies  Allergen Reactions  . Ciprofloxacin Itching, Swelling and Other  (See Comments)    Possibly causing tremors?     Physical Exam:   Vitals  Blood pressure (!) 157/71, pulse 63, temperature 98.1 F (36.7 C), temperature source Oral, resp. rate (!) 21, height 5\' 5"  (1.651 m), weight 69.9 kg, SpO2 98 %.  1.  General: axoxo3  2. Psychiatric: euthymic  3. Neurologic: Nonfocal, cn2-12 intact, reflexes 2+ symmetric, diffuse with no clonus, motor 5/5 in all 4 ext  4. HEENMT:  Anicteric, pupils 1.40mm symmetric, direct, consensual intact Neck: no jvd, no bruit  5. Respiratory : CTAB  6. Cardiovascular : rrr s1, s2, no m/g'/r  7. Gastrointestinal:  Abd: soft, nt, nd, +bs  8. Skin:  Ext: no c/c/e, no rash  9.Musculoskeletal:  Good ROM    Data Review:    CBC Recent Labs  Lab 02/17/19 2117  WBC 5.7  HGB 12.7  HCT 37.5  PLT 238  MCV 88.0  MCH 29.8  MCHC 33.9  RDW 12.9  LYMPHSABS 1.5  MONOABS 0.3  EOSABS 0.1  BASOSABS 0.0   ------------------------------------------------------------------------------------------------------------------  Results for orders placed or performed during the hospital encounter of 02/17/19 (from the past 48 hour(s))  Basic metabolic panel     Status: Abnormal   Collection Time: 02/17/19  9:17 PM  Result Value Ref Range   Sodium 121 (L) 135 - 145 mmol/L   Potassium 3.6 3.5 - 5.1 mmol/L   Chloride 88 (L) 98 - 111 mmol/L   CO2 23 22 - 32 mmol/L   Glucose, Bld 63 (L) 70 - 99 mg/dL   BUN 13 8 - 23 mg/dL   Creatinine, Ser 1.34 (H) 0.44 - 1.00 mg/dL   Calcium 9.1 8.9 - 10.3 mg/dL   GFR calc non Af Amer 38 (L) >60 mL/min   GFR calc Af Amer 44 (L) >60 mL/min   Anion gap 10 5 - 15    Comment: Performed at [facility], [address], [place], [place] [address]  CBC with Differential     Status: None   Collection Time: 02/17/19  9:17 PM  Result Value Ref Range   WBC 5.7 4.0 - 10.5 K/uL   RBC 4.26 3.87 - 5.11 MIL/uL   Hemoglobin 12.7 12.0 - 15.0 g/dL   HCT 37.5 36.0 - 46.0 %   MCV 88.0 80.0 -  100.0 fL   MCH 29.8 26.0 - 34.0 pg   MCHC 33.9 30.0 - 36.0 g/dL  RDW 12.9 11.5 - 15.5 %   Platelets 238 150 - 400 K/uL   nRBC 0.0 0.0 - 0.2 %   Neutrophils Relative % 66 %   Neutro Abs 3.7 1.7 - 7.7 K/uL   Lymphocytes Relative 27 %   Lymphs Abs 1.5 0.7 - 4.0 K/uL   Monocytes Relative 6 %   Monocytes Absolute 0.3 0.1 - 1.0 K/uL   Eosinophils Relative 1 %   Eosinophils Absolute 0.1 0.0 - 0.5 K/uL   Basophils Relative 0 %   Basophils Absolute 0.0 0.0 - 0.1 K/uL   Immature Granulocytes 0 %   Abs Immature Granulocytes 0.02 0.00 - 0.07 K/uL    Comment: Performed at [facility] [address] [place], [place] [address]  Osmolality, urine     Status: Abnormal   Collection Time: 02/18/19 12:03 AM  Result Value Ref Range   Osmolality, Ur 273 (L) 300 - 900 mOsm/kg    Comment: Performed at [facility] [address], [place], [place] [address]  Urinalysis, Routine w reflex microscopic     Status: Abnormal   Collection Time: 02/18/19 12:09 AM  Result Value Ref Range   Color, Urine YELLOW YELLOW   APPearance CLEAR CLEAR   Specific Gravity, Urine 1.008 1.005 - 1.030   pH 5.0 5.0 - 8.0   Glucose, UA NEGATIVE NEGATIVE mg/dL   Hgb urine dipstick NEGATIVE NEGATIVE   Bilirubin Urine NEGATIVE NEGATIVE   Ketones, ur NEGATIVE NEGATIVE mg/dL   Protein, ur NEGATIVE NEGATIVE mg/dL   Nitrite NEGATIVE NEGATIVE   Leukocytes,Ua MODERATE (A) NEGATIVE   RBC / HPF 0-5 0 - 5 RBC/hpf   WBC, UA 11-20 0 - 5 WBC/hpf   Bacteria, UA RARE (A) NONE SEEN   Squamous Epithelial / LPF 6-10 0 - 5   Hyaline Casts, UA PRESENT     Comment: Performed at [facility], [address], [place], [place] [address]  Sodium, urine, random     Status: None   Collection Time: 02/18/19 12:13 AM  Result Value Ref Range   Sodium, Ur 40 mmol/L    Comment: Performed at [facility] [address] [place], [place] [address]    Chemistries  Recent Labs  Lab 02/17/19 2117  NA 121*  K 3.6  CL 88*   CO2 23  GLUCOSE 63*  BUN 13  CREATININE 1.34*  CALCIUM 9.1   ------------------------------------------------------------------------------------------------------------------  ------------------------------------------------------------------------------------------------------------------ GFR: Estimated Creatinine Clearance: 35.1 mL/min (A) (by C-G formula based on SCr of 1.34 mg/dL (H)). Liver Function Tests: No results for input(s): AST, ALT, ALKPHOS, BILITOT, PROT, ALBUMIN in the last 168 hours. No results for input(s): LIPASE, AMYLASE in the last 168 hours. No results for input(s): AMMONIA in the last 168 hours. Coagulation Profile: No results for input(s): INR, PROTIME in the last 168 hours. Cardiac Enzymes: No results for input(s): CKTOTAL, CKMB, CKMBINDEX, TROPONINI in the last 168 hours. BNP (last 3 results) No results for input(s): PROBNP in the last 8760 hours. HbA1C: No results for input(s): HGBA1C in the last 72 hours. CBG: No results for input(s): GLUCAP in the last 168 hours. Lipid Profile: No results for input(s): CHOL, HDL, LDLCALC, TRIG, CHOLHDL, LDLDIRECT in the last 72 hours. Thyroid Function Tests: No results for input(s): TSH, T4TOTAL, FREET4, T3FREE, THYROIDAB in the last 72 hours. Anemia Panel: No results for input(s): VITAMINB12, FOLATE, FERRITIN, TIBC, IRON, RETICCTPCT in the last 72 hours.  --------------------------------------------------------------------------------------------------------------- Urine analysis:  Component Value Date/Time   COLORURINE YELLOW 02/18/2019 0009   APPEARANCEUR CLEAR 02/18/2019 0009   LABSPEC 1.008 02/18/2019 0009   PHURINE 5.0 02/18/2019 0009   GLUCOSEU NEGATIVE 02/18/2019 0009   HGBUR NEGATIVE 02/18/2019 0009   BILIRUBINUR NEGATIVE 02/18/2019 0009   KETONESUR NEGATIVE 02/18/2019 0009   PROTEINUR NEGATIVE 02/18/2019 0009   UROBILINOGEN 0.2 06/20/2014 1842   NITRITE NEGATIVE 02/18/2019 0009   LEUKOCYTESUR  MODERATE (A) 02/18/2019 0009      Imaging Results:    CT Head Wo Contrast  Result Date: 02/17/2019 CLINICAL DATA:  79 year old female with fall. EXAM: CT HEAD WITHOUT CONTRAST CT CERVICAL SPINE WITHOUT CONTRAST TECHNIQUE: Multidetector CT imaging of the head and cervical spine was performed following the standard protocol without intravenous contrast. Multiplanar CT image reconstructions of the cervical spine were also generated. COMPARISON:  Head CT dated 06/20/2014. FINDINGS: CT HEAD FINDINGS Brain: The ventricles and sulci appropriate size for patient's age. Minimal periventricular and deep white matter chronic microvascular ischemic changes noted. There is no acute intracranial hemorrhage. No mass effect or midline shift. No extra-axial fluid collection. Vascular: No hyperdense vessel or unexpected calcification. Skull: Normal. Negative for fracture or focal lesion. Sinuses/Orbits: No acute finding. Other: None CT CERVICAL SPINE FINDINGS Alignment: No acute subluxation. There is straightening of normal cervical lordosis which may be positional or due to muscle spasm. There is grade 1 C3-C4 retrolisthesis. Skull base and vertebrae: No acute fracture. No primary bone lesion or focal pathologic process. Soft tissues and spinal canal: No prevertebral fluid or swelling. No visible canal hematoma. Disc levels: Multilevel degenerative changes. There is a large disc osteophyte complex at C4-C5 with mild narrowing of the central canal and moderate bilateral neural foramina narrowing. Upper chest: Negative. Other: Advanced left carotid bulb calcified plaque. IMPRESSION: 1. No acute intracranial pathology. 2. No acute/traumatic cervical spine pathology. Multilevel degenerative changes most prominent at C4-C5. Electronically Signed   By: Anner Crete M.D.   On: 02/17/2019 21:46   CT Cervical Spine Wo Contrast  Result Date: 02/17/2019 CLINICAL DATA:  79 year old female with fall. EXAM: CT HEAD WITHOUT  CONTRAST CT CERVICAL SPINE WITHOUT CONTRAST TECHNIQUE: Multidetector CT imaging of the head and cervical spine was performed following the standard protocol without intravenous contrast. Multiplanar CT image reconstructions of the cervical spine were also generated. COMPARISON:  Head CT dated 06/20/2014. FINDINGS: CT HEAD FINDINGS Brain: The ventricles and sulci appropriate size for patient's age. Minimal periventricular and deep white matter chronic microvascular ischemic changes noted. There is no acute intracranial hemorrhage. No mass effect or midline shift. No extra-axial fluid collection. Vascular: No hyperdense vessel or unexpected calcification. Skull: Normal. Negative for fracture or focal lesion. Sinuses/Orbits: No acute finding. Other: None CT CERVICAL SPINE FINDINGS Alignment: No acute subluxation. There is straightening of normal cervical lordosis which may be positional or due to muscle spasm. There is grade 1 C3-C4 retrolisthesis. Skull base and vertebrae: No acute fracture. No primary bone lesion or focal pathologic process. Soft tissues and spinal canal: No prevertebral fluid or swelling. No visible canal hematoma. Disc levels: Multilevel degenerative changes. There is a large disc osteophyte complex at C4-C5 with mild narrowing of the central canal and moderate bilateral neural foramina narrowing. Upper chest: Negative. Other: Advanced left carotid bulb calcified plaque. IMPRESSION: 1. No acute intracranial pathology. 2. No acute/traumatic cervical spine pathology. Multilevel degenerative changes most prominent at C4-C5. Electronically Signed   By: Anner Crete M.D.   On: 02/17/2019 21:46  Assessment & Plan:    Principal Problem:   Hyponatremia Active Problems:   Near syncope  Near syncope, dizziness Tele Trop I  Check carotid utlrasound Check cardiac echo Monitor  Hyponatremia secondary to ? 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  Code Status:  FULL CODE per patient, attempted to notify  daughter that patient will be admitted to [facility], no response  Admission status: Observation/: Based on patients clinical presentation and evaluation of above clinical data, I have made determination that patient meets Inpatient criteria at this time.  Time spent in minutes : 55 minutes   Jani Gravel M.D on 02/18/2019 at 1:27 AM

## 2019-02-18 NOTE — Progress Notes (Addendum)
Carotid artery duplex completed. Refer to "CV Proc" under chart review to view preliminary results.  02/18/2019 9:04 AM Kelby Aline., MHA, RVT, RDCS, RDMS

## 2019-02-18 NOTE — Progress Notes (Signed)
[facility]                                                                              Patient Demographics  [name], is a 79 y.o. female, DOB - [birth year], [id number]  Admit date - 02/17/2019   Admitting Physician Benito Mccreedy, MD  Outpatient Primary MD for the patient is Benito Mccreedy, MD  Outpatient specialists:   LOS - 0  days    Chief Complaint  Patient presents with  . Headache       Brief summary  [name]  is a 79 y.o. female,  Hypothyroidism, Hypertension, Dm2, Lupus,  H/o breast cancer, presents due to near syncope,  Felt dizzy as she was walking from the bedroom to the bathroom, initial evaluation with CT brain and C-spine was negative, labs indicated hyponatremia of 121, and admitted for near syncope with hyponatremia and UTI.    Assessment & Plan    Principal Problem:   Hyponatremia Active Problems:   Near syncope Near syncope Episode of dizziness with feeling of passing out Cardiac enzymes Echocardiogram Carotid Doppler  Hyponatremia Hydrochlorothiazide discontinued admission with urinary studies TSH-within normal limits Gentle saline hydration with monitoring of renal function and after lites  Acute UTI Antibiotics initiated with IV Rocephin  Type 2 diabetes mellitus Hypoglycemic episodes documented earlier Continue oral medication Monitor CBGs with sliding scale insulin  Hypothyroidism Admitted on 75 mg daily of levothyroxine  Code Status: Full code DVT Prophylaxis:  Lovenox  Family Communication: Discussed in detail with the patient, all imaging results, lab results explained to the patient   Disposition Plan: Home when after work-up complete  Time Spent in minutes 35 minutes  Procedures:    Consultants:     Antimicrobials:      Medications  Scheduled Meds: . enoxaparin (LOVENOX) injection  40 mg Subcutaneous Q24H  . gabapentin  300 mg Oral BID  . glimepiride  2 mg Oral Q breakfast   . hydroxychloroquine  200 mg Oral Daily  . insulin aspart  0-5 Units Subcutaneous QHS  . insulin aspart  0-9 Units Subcutaneous TID WC  . levothyroxine  75 mcg Oral Q0600  . losartan  50 mg Oral Daily  . pantoprazole  40 mg Oral Daily  . umeclidinium bromide  1 puff Inhalation Daily   Continuous Infusions: . [START ON 02/19/2019] cefTRIAXone (ROCEPHIN)  IV     PRN Meds:.acetaminophen **OR** acetaminophen, capsaicin   Antibiotics   Anti-infectives (From admission, onward)   Start     Dose/Rate Route Frequency Ordered Stop   02/19/19 0000  cefTRIAXone (ROCEPHIN) 1 g in sodium chloride 0.9 % 100 mL IVPB     1 g 200 mL/hr over 30 Minutes Intravenous Every 24 hours 02/18/19 0200     02/18/19 1000  hydroxychloroquine (PLAQUENIL) tablet 200 mg     200 mg Oral Daily 02/18/19 0614     02/18/19 0215  cefTRIAXone (ROCEPHIN) 1 g in sodium chloride 0.9 % 100 mL IVPB     1 g 200 mL/hr over 30 Minutes Intravenous  Once 02/18/19 0206 02/18/19 0254        Subjective:  [name] was seen and examined today.  No more dizziness but continues to feel weak Objective:   Vitals:   02/18/19 0415 02/18/19 0430 02/18/19 0500 02/18/19 0607  BP: (!) 132/59 127/66 140/73 (!) 149/72  Pulse: 82 85 83 64  Resp: 19 17 18    Temp:    98.2 F (36.8 C)  TempSrc:    Oral  SpO2: 99% 99% 100% 100%  Weight:   70.4 kg   Height:       No intake or output data in the 24 hours ending 02/18/19 1449   Wt Readings from Last 3 Encounters:  02/18/19 70.4 kg  02/15/18 74.3 kg  08/18/17 74.4 kg     Exam  General: NAD  HEENT: NCAT,  PERRL,MMM  Neck: SUPPLE, (-) JVD  Cardiovascular: RRR, (-) GALLOP, (-) MURMUR  Respiratory: CTA  Gastrointestinal: SOFT, (-) DISTENSION, BS(+), (_) TENDERNESS  Ext: (-) CYANOSIS, (-) EDEMA  Neuro: A, OX 3  Skin:(-) RASH  Psych:NORMAL AFFECT/MOOD   Data Reviewed:  I have personally reviewed following labs and imaging studies  Micro Results Recent Results  (from the past 240 hour(s))  SARS CORONAVIRUS 2 (TAT 6-24 HRS) Nasopharyngeal Nasopharyngeal Swab     Status: None   Collection Time: 02/18/19 12:03 AM   Specimen: Nasopharyngeal Swab  Result Value Ref Range Status   SARS Coronavirus 2 NEGATIVE NEGATIVE Final    Comment: (NOTE) SARS-CoV-2 target nucleic acids are NOT DETECTED. The SARS-CoV-2 RNA is generally detectable in upper and lower respiratory specimens during the acute phase of infection. Negative results do not preclude SARS-CoV-2 infection, do not rule out co-infections with other pathogens, and should not be used as the sole basis for treatment or other patient management decisions. Negative results must be combined with clinical observations, patient history, and epidemiological information. The expected result is Negative. Fact Sheet for Patients: SugarRoll.be Fact Sheet for Healthcare Providers: https://www.woods-mathews.com/ This test is not yet approved or cleared by the [place] FDA and  has been authorized for detection and/or diagnosis of SARS-CoV-2 by FDA under an Emergency Use Authorization (EUA). This EUA will remain  in effect (meaning this test can be used) for the duration of the COVID-19 declaration under Section 56 4(b)(1) of the Act, 21 U.S.C. section 360bbb-3(b)(1), unless the authorization is terminated or revoked sooner. Performed at [facility], [place] [address], [place], [place] [address]     Radiology Reports CT Head Wo Contrast  Result Date: 02/17/2019 CLINICAL DATA:  79 year old female with fall. EXAM: CT HEAD WITHOUT CONTRAST CT CERVICAL SPINE WITHOUT CONTRAST TECHNIQUE: Multidetector CT imaging of the head and cervical spine was performed following the standard protocol without intravenous contrast. Multiplanar CT image reconstructions of the cervical spine were also generated. COMPARISON:  Head CT dated 06/20/2014. FINDINGS: CT HEAD FINDINGS  Brain: The ventricles and sulci appropriate size for patient's age. Minimal periventricular and deep white matter chronic microvascular ischemic changes noted. There is no acute intracranial hemorrhage. No mass effect or midline shift. No extra-axial fluid collection. Vascular: No hyperdense vessel or unexpected calcification. Skull: Normal. Negative for fracture or focal lesion. Sinuses/Orbits: No acute finding. Other: None CT CERVICAL SPINE FINDINGS Alignment: No acute subluxation. There is straightening of normal cervical lordosis which may be positional or due to muscle spasm. There is grade 1 C3-C4 retrolisthesis. Skull base and vertebrae: No acute fracture. No primary bone lesion or focal pathologic process. Soft tissues and spinal canal: No prevertebral fluid or swelling. No visible canal  hematoma. Disc levels: Multilevel degenerative changes. There is a large disc osteophyte complex at C4-C5 with mild narrowing of the central canal and moderate bilateral neural foramina narrowing. Upper chest: Negative. Other: Advanced left carotid bulb calcified plaque. IMPRESSION: 1. No acute intracranial pathology. 2. No acute/traumatic cervical spine pathology. Multilevel degenerative changes most prominent at C4-C5. Electronically Signed   By: Anner Crete M.D.   On: 02/17/2019 21:46   CT Cervical Spine Wo Contrast  Result Date: 02/17/2019 CLINICAL DATA:  79 year old female with fall. EXAM: CT HEAD WITHOUT CONTRAST CT CERVICAL SPINE WITHOUT CONTRAST TECHNIQUE: Multidetector CT imaging of the head and cervical spine was performed following the standard protocol without intravenous contrast. Multiplanar CT image reconstructions of the cervical spine were also generated. COMPARISON:  Head CT dated 06/20/2014. FINDINGS: CT HEAD FINDINGS Brain: The ventricles and sulci appropriate size for patient's age. Minimal periventricular and deep white matter chronic microvascular ischemic changes noted. There is no acute  intracranial hemorrhage. No mass effect or midline shift. No extra-axial fluid collection. Vascular: No hyperdense vessel or unexpected calcification. Skull: Normal. Negative for fracture or focal lesion. Sinuses/Orbits: No acute finding. Other: None CT CERVICAL SPINE FINDINGS Alignment: No acute subluxation. There is straightening of normal cervical lordosis which may be positional or due to muscle spasm. There is grade 1 C3-C4 retrolisthesis. Skull base and vertebrae: No acute fracture. No primary bone lesion or focal pathologic process. Soft tissues and spinal canal: No prevertebral fluid or swelling. No visible canal hematoma. Disc levels: Multilevel degenerative changes. There is a large disc osteophyte complex at C4-C5 with mild narrowing of the central canal and moderate bilateral neural foramina narrowing. Upper chest: Negative. Other: Advanced left carotid bulb calcified plaque. IMPRESSION: 1. No acute intracranial pathology. 2. No acute/traumatic cervical spine pathology. Multilevel degenerative changes most prominent at C4-C5. Electronically Signed   By: Anner Crete M.D.   On: 02/17/2019 21:46   ECHOCARDIOGRAM COMPLETE  Result Date: 02/18/2019   ECHOCARDIOGRAM REPORT   Patient Name:   THAVY ROWLES Date of Exam: 02/18/2019 Medical Rec #:  [id number]    Height:       65.0 in Accession #:    [id number]   Weight:       155.2 lb Date of Birth:  21-Sep-1940     BSA:          1.78 m Patient Age:    45 years     BP:           149/72 mmHg Patient Gender: F            HR:           61 bpm. Exam Location:  Inpatient Procedure: 2D Echo Indications:    Syncope R55  History:        Patient has no prior history of Echocardiogram examinations.                 Risk Factors:Hypertension and Diabetes.  Sonographer:    Mikki Santee RDCS (AE) Referring Phys: Terrell  1. Mitral valve anterior mitral leaflet tip vegetation 1 x 0.7 cm. 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 Consider blood cultures and ID consultation as well as cardiology consultation.  2. The mitral valve is abnormal. Trivial mitral valve regurgitation. No evidence of mitral stenosis.  3. Left ventricular ejection fraction, by visual estimation, is 60 to 65%. The left ventricle has normal  function. There is moderately increased left ventricular hypertrophy.  4. Elevated left ventricular end-diastolic pressure.  5. Left ventricular diastolic parameters are consistent with Grade I diastolic dysfunction (impaired relaxation).  6. Global right ventricle has normal systolic function.The right ventricular size is normal. No increase in right ventricular wall thickness.  7. Left atrial size was normal.  8. Right atrial size was normal.  9. The tricuspid valve is normal in structure. Tricuspid valve regurgitation is mild-moderate. 10. The aortic valve is tricuspid. Aortic valve regurgitation is not visualized. No evidence of aortic valve sclerosis or stenosis. 11. The pulmonic valve was normal in structure. Pulmonic valve regurgitation is mild. FINDINGS  Left Ventricle: Left ventricular ejection fraction, by visual estimation, is 60 to 65%. The left ventricle has normal function. The left ventricle has no regional wall motion abnormalities. There is moderately increased left ventricular hypertrophy. Left ventricular diastolic parameters are consistent with Grade I diastolic dysfunction (impaired relaxation). Elevated left ventricular end-diastolic pressure. Right Ventricle: The right ventricular size is normal. No increase in right ventricular wall thickness. Global RV systolic function is has normal systolic function. Left Atrium: Left atrial size was normal in size. Right Atrium: Right atrial size was normal in size Pericardium: There is no evidence of pericardial effusion. Mitral Valve: The mitral valve is abnormal. Trivial mitral valve  regurgitation. No evidence of mitral valve stenosis by observation. Mitral valve anterior mitral leaflet tip vegetation 1 x 0.7 cm. 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 Consider blood cultures and ID consultation as well as cardiology consultation. Tricuspid Valve: The tricuspid valve is normal in structure. Tricuspid valve regurgitation is mild-moderate. Aortic Valve: The aortic valve is tricuspid. Aortic valve regurgitation is not visualized. The aortic valve is structurally normal, with no evidence of sclerosis or stenosis. Pulmonic Valve: The pulmonic valve was normal in structure. Pulmonic valve regurgitation is mild. Pulmonic regurgitation is mild. Aorta: The aortic root is normal in size and structure. Venous: The inferior vena cava was not well visualized. IAS/Shunts: The interatrial septum was not well visualized. There is no evidence of a patent foramen ovale. No ventricular septal defect is seen or detected. There is no evidence of an atrial septal defect.  LEFT VENTRICLE PLAX 2D LVIDd:         2.90 cm  Diastology LVIDs:         2.20 cm  LV e' lateral:   5.87 cm/s LV PW:         1.30 cm  LV E/e' lateral: 11.8 LV IVS:        1.40 cm  LV e' medial:    3.86 cm/s LVOT diam:     2.30 cm  LV E/e' medial:  18.0 LV SV:         16 ml LV SV Index:   8.84 LVOT Area:     4.15 cm  RIGHT VENTRICLE RV S prime:     8.76 cm/s TAPSE (M-mode): 1.0 cm LEFT ATRIUM             Index       RIGHT ATRIUM           Index LA diam:        3.40 cm 1.91 cm/m  RA Area:     10.00 cm LA Vol (A2C):   39.0 ml 21.96 ml/m RA Volume:   15.60 ml  8.78 ml/m LA Vol (A4C):   30.2 ml  17.00 ml/m LA Biplane Vol: 35.0 ml 19.71 ml/m  AORTIC VALVE LVOT Vmax:   87.50 cm/s LVOT Vmean:  60.100 cm/s LVOT VTI:    0.197 m  AORTA Ao Root diam: 3.00 cm MITRAL VALVE                         TRICUSPID VALVE MV Area (PHT): 1.71 cm              TR  Peak grad:   20.9 mmHg MV PHT:        128.76 msec           TR Vmax:        259.00 cm/s MV Decel Time: 444 msec MV E velocity: 69.50 cm/s  103 cm/s  SHUNTS MV A velocity: 113.00 cm/s 70.3 cm/s Systemic VTI:  0.20 m MV E/A ratio:  0.62        1.5       Systemic Diam: 2.30 cm  Cherlynn Kaiser MD Electronically signed by Cherlynn Kaiser MD Signature Date/Time: 02/18/2019/11:32:52 AM    Final    VAS US CAROTID  Result Date: 02/18/2019 Carotid Arterial Duplex Study Indications:       Syncope. Risk Factors:      Hypertension, Diabetes. Comparison Study:  No prior study. Performing Technologist: Maudry Mayhew MHA, RDMS, RVT, RDCS  Examination Guidelines: A complete evaluation includes B-mode imaging, spectral Doppler, color Doppler, and power Doppler as needed of all accessible portions of each vessel. Bilateral testing is considered an integral part of a complete examination. Limited examinations for reoccurring indications may be performed as noted.  Right Carotid Findings: +----------+--------+-------+--------+--------------------------------+--------+           PSV cm/sEDV    StenosisPlaque Description              Comments                   cm/s                                                    +----------+--------+-------+--------+--------------------------------+--------+ CCA Prox  95      10             smooth and heterogenous                  +----------+--------+-------+--------+--------------------------------+--------+ CCA Distal56      9              smooth and heterogenous                  +----------+--------+-------+--------+--------------------------------+--------+ ICA Prox  53      14             heterogenous, smooth and                                                  calcific                                 +----------+--------+-------+--------+--------------------------------+--------+ ICA Distal150     33                                                       +----------+--------+-------+--------+--------------------------------+--------+  ECA       65      5              smooth, heterogenous and                                                  calcific                                 +----------+--------+-------+--------+--------------------------------+--------+ +----------+--------+-------+----------------+-------------------+           PSV cm/sEDV cmsDescribe        Arm Pressure (mmHG) +----------+--------+-------+----------------+-------------------+ Subclavian101            Multiphasic, WNL                    +----------+--------+-------+----------------+-------------------+ +---------+--------+--+--------+--+---------+ VertebralPSV cm/s66EDV cm/s12Antegrade +---------+--------+--+--------+--+---------+  Left Carotid Findings: +----------+--------+--------+--------+-----------------------+--------+           PSV cm/sEDV cm/sStenosisPlaque Description     Comments +----------+--------+--------+--------+-----------------------+--------+ CCA Prox  86      15                                              +----------+--------+--------+--------+-----------------------+--------+ CCA Distal68      21              smooth and heterogenous         +----------+--------+--------+--------+-----------------------+--------+ ICA Prox  55      13              calcific                        +----------+--------+--------+--------+-----------------------+--------+ ICA Distal64      20                                              +----------+--------+--------+--------+-----------------------+--------+ ECA       63      7               calcific                        +----------+--------+--------+--------+-----------------------+--------+ +----------+--------+--------+----------------+-------------------+           PSV cm/sEDV cm/sDescribe        Arm Pressure (mmHG)  +----------+--------+--------+----------------+-------------------+ [id number]              Multiphasic, WNL                    +----------+--------+--------+----------------+-------------------+ +---------+--------+--+--------+--+---------+ VertebralPSV cm/s78EDV cm/s17Antegrade +---------+--------+--+--------+--+---------+   Summary: Right Carotid: Velocities in the right ICA are consistent with a 1-39% stenosis. Left Carotid: Velocities in the left ICA are consistent with a 1-39% stenosis. Vertebrals:  Bilateral vertebral arteries demonstrate antegrade flow. Subclavians: Normal flow hemodynamics were seen in bilateral subclavian              arteries. *See table(s) above for measurements and observations.  Electronically signed by Ruta Hinds MD on 02/18/2019 at 9:37:05 AM.    Final  Lab Data:  CBC: Recent Labs  Lab 02/17/19 2117 02/18/19 0233  WBC 5.7 5.7  NEUTROABS 3.7  --   HGB 12.7 12.2  HCT 37.5 36.8  MCV 88.0 88.9  PLT 238 [id number]   Basic Metabolic Panel: Recent Labs  Lab 02/17/19 2117 02/18/19 0233  NA 121* 126*  K 3.6 4.4  CL 88* 94*  CO2 23 22  GLUCOSE 63* 50*  BUN 13 13  CREATININE 1.34* 1.23*  CALCIUM 9.1 9.2   GFR: Estimated Creatinine Clearance: 38.3 mL/min (A) (by C-G formula based on SCr of 1.23 mg/dL (H)). Liver Function Tests: Recent Labs  Lab 02/18/19 0233  AST 27  ALT 19  ALKPHOS 57  BILITOT 0.3  PROT 8.0  ALBUMIN 3.8   No results for input(s): LIPASE, AMYLASE in the last 168 hours. No results for input(s): AMMONIA in the last 168 hours. Coagulation Profile: No results for input(s): INR, PROTIME in the last 168 hours. Cardiac Enzymes: No results for input(s): CKTOTAL, CKMB, CKMBINDEX, TROPONINI in the last 168 hours. BNP (last 3 results) No results for input(s): PROBNP in the last 8760 hours. HbA1C: No results for input(s): HGBA1C in the last 72 hours. CBG: Recent Labs  Lab 02/18/19 0219 02/18/19 0244 02/18/19 0304  02/18/19 0633 02/18/19 1152  GLUCAP 31* 30* 119* 153* 90   Lipid Profile: No results for input(s): CHOL, HDL, LDLCALC, TRIG, CHOLHDL, LDLDIRECT in the last 72 hours. Thyroid Function Tests: Recent Labs    02/18/19 0113  TSH 4.495   Anemia Panel: No results for input(s): VITAMINB12, FOLATE, FERRITIN, TIBC, IRON, RETICCTPCT in the last 72 hours. Urine analysis:    Component Value Date/Time   COLORURINE YELLOW 02/18/2019 0009   APPEARANCEUR CLEAR 02/18/2019 0009   LABSPEC 1.008 02/18/2019 0009   PHURINE 5.0 02/18/2019 0009   GLUCOSEU NEGATIVE 02/18/2019 0009   HGBUR NEGATIVE 02/18/2019 0009   BILIRUBINUR NEGATIVE 02/18/2019 0009   KETONESUR NEGATIVE 02/18/2019 0009   PROTEINUR NEGATIVE 02/18/2019 0009   UROBILINOGEN 0.2 06/20/2014 1842   NITRITE NEGATIVE 02/18/2019 0009   LEUKOCYTESUR MODERATE (A) 02/18/2019 0009     Benito Mccreedy M.D. [facility] 02/18/2019, 2:49 PM  Pager: [phone] Between 7am to 7pm - call Pager - [phone]  After 7pm go to [email or web address] - password [email or web address]  Call night coverage person covering after 7pm

## 2019-02-19 LAB — GLUCOSE, CAPILLARY
Glucose-Capillary: 111 mg/dL — ABNORMAL HIGH (ref 70–99)
Glucose-Capillary: 111 mg/dL — ABNORMAL HIGH (ref 70–99)
Glucose-Capillary: 125 mg/dL — ABNORMAL HIGH (ref 70–99)
Glucose-Capillary: 84 mg/dL (ref 70–99)
Glucose-Capillary: 98 mg/dL (ref 70–99)
Glucose-Capillary: 99 mg/dL (ref 70–99)

## 2019-02-19 NOTE — Plan of Care (Addendum)
Attempted to call daughter back for an update at 61, no answer. Will continue to monitor.  Problem: Education: Goal: Knowledge of General Education information will improve Description: Including pain rating scale, medication(s)/side effects and non-pharmacologic comfort measures Outcome: Progressing   Problem: Clinical Measurements: Goal: Will remain free from infection Outcome: Progressing   Problem: Pain Managment: Goal: General experience of comfort will improve Outcome: Progressing   Problem: Safety: Goal: Ability to remain free from injury will improve Outcome: Progressing   Problem: Skin Integrity: Goal: Risk for impaired skin integrity will decrease Outcome: Progressing

## 2019-02-19 NOTE — Progress Notes (Signed)
[facility]                                                                              Patient Demographics  [name], is a 79 y.o. female, DOB - [birth year], [id number]  Admit date - 02/17/2019   Admitting Physician Benito Mccreedy, MD  Outpatient Primary MD for the patient is Osei-Bonsu, Iona Beard, MD  Outpatient specialists:   LOS - 1  days    Chief Complaint  Patient presents with   Headache       Brief summary  [name]  is a 79 y.o. female,  Hypothyroidism, Hypertension, Dm2, Lupus,  H/o breast cancer, presents due to near syncope,  Felt dizzy as she was walking from the bedroom to the bathroom, initial evaluation with CT brain and C-spine was negative, labs indicated hyponatremia of 121, and admitted for near syncope with hyponatremia and UTI.    Assessment & Plan    Principal Problem:   Hyponatremia Active Problems:   Near syncope Near syncope Episode of dizziness with feeling of passing out Cardiac enzymes Echocardiogram, 02/18/2019-EF 60-65%, incidental Mitral valve anterior mitral leaflet tip vegetation 1 x 0.7 cm. 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 Einar Gip consulted  Consider ID consult as needed Hyponatremia Hydrochlorothiazide discontinued admission with urinary studies TSH-within normal limits Gentle saline hydration with monitoring of renal function and after lites  Acute kidney injury Likely due to mild dehydration on admission Improving with gentle hydration Monitor renal function  electrolytes  Acute UTI Antibiotics initiated with IV Rocephin  Type 2 diabetes mellitus Hypoglycemic episodes documented earlier Continue oral medication Monitor CBGs with sliding scale insulin  Hypothyroidism Admitted on 75 mg daily of levothyroxine  Code Status: Full code DVT Prophylaxis:  Lovenox  Family Communication: Discussed in detail with the patient, all imaging results, lab results explained to the patient   Disposition Plan: Home when after work-up complete  Time Spent in minutes 25 minutes  Procedures:    Consultants:     Antimicrobials:      Medications  Scheduled Meds:  enoxaparin (LOVENOX) injection  40 mg Subcutaneous Q24H   gabapentin  300 mg Oral BID   glimepiride  2 mg Oral Q breakfast   hydroxychloroquine  200 mg Oral Daily   insulin aspart  0-5 Units Subcutaneous QHS   insulin aspart  0-9 Units Subcutaneous TID WC   levothyroxine  75 mcg Oral Q0600   losartan  50 mg Oral Daily   pantoprazole  40 mg Oral Daily   umeclidinium bromide  1 puff Inhalation Daily   Continuous Infusions:  cefTRIAXone (ROCEPHIN)  IV 1 g (02/19/19 0048)   PRN Meds:.acetaminophen **OR** acetaminophen, capsaicin   Antibiotics   Anti-infectives (From admission, onward)   Start     Dose/Rate Route Frequency Ordered Stop   02/19/19  0000  cefTRIAXone (ROCEPHIN) 1 g in sodium chloride 0.9 % 100 mL IVPB     1 g 200 mL/hr over 30 Minutes Intravenous Every 24 hours 02/18/19 0200     02/18/19 1000  hydroxychloroquine (PLAQUENIL) tablet 200 mg     200 mg Oral Daily 02/18/19 0614     02/18/19 0215  cefTRIAXone (ROCEPHIN) 1 g in sodium chloride 0.9 % 100 mL IVPB     1 g 200 mL/hr over 30 Minutes Intravenous  Once 02/18/19 0206 02/18/19 0254        Subjective:   [name] was seen and examined today.  Overall improving but continues to feel weak PT to work with patient As per discharge the next 24 hours Objective:   Vitals:   02/18/19 1520  02/18/19 2009 02/19/19 0359 02/19/19 0738  BP: 133/79 126/65 (!) 124/58 109/64  Pulse: 76 77 68 73  Resp:  15 16   Temp: 98.8 F (37.1 C) 98.5 F (36.9 C) 98.9 F (37.2 C) 99 F (37.2 C)  TempSrc: Oral Oral Oral Oral  SpO2: 100% 98% 100% 100%  Weight:   69.4 kg   Height:        Intake/Output Summary (Last 24 hours) at 02/19/2019 1225 Last data filed at 02/19/2019 0700 Gross per 24 hour  Intake 600 ml  Output --  Net 600 ml     Wt Readings from Last 3 Encounters:  02/19/19 69.4 kg  02/15/18 74.3 kg  08/18/17 74.4 kg     Exam  General: NAD  HEENT: NCAT,  PERRL,MMM  Neck: SUPPLE, (-) JVD  Cardiovascular: RRR, (-) GALLOP, (-) MURMUR  Respiratory: CTA  Gastrointestinal: SOFT, (-) DISTENSION, BS(+), (_) TENDERNESS  Ext: (-) CYANOSIS, (-) EDEMA  Neuro: A, OX 3  Skin:(-) RASH  Psych:NORMAL AFFECT/MOOD   Data Reviewed:  I have personally reviewed following labs and imaging studies  Micro Results Recent Results (from the past 240 hour(s))  SARS CORONAVIRUS 2 (TAT 6-24 HRS) Nasopharyngeal Nasopharyngeal Swab     Status: None   Collection Time: 02/18/19 12:03 AM   Specimen: Nasopharyngeal Swab  Result Value Ref Range Status   SARS Coronavirus 2 NEGATIVE NEGATIVE Final    Comment: (NOTE) SARS-CoV-2 target nucleic acids are NOT DETECTED. The SARS-CoV-2 RNA is generally detectable in upper and lower respiratory specimens during the acute phase of infection. Negative results do not preclude SARS-CoV-2 infection, do not rule out co-infections with other pathogens, and should not be used as the sole basis for treatment or other patient management decisions. Negative results must be combined with clinical observations, patient history, and epidemiological information. The expected result is Negative. Fact Sheet for Patients: SugarRoll.be Fact Sheet for Healthcare Providers: https://www.woods-mathews.com/ This test is not  yet approved or cleared by the [place] FDA and  has been authorized for detection and/or diagnosis of SARS-CoV-2 by FDA under an Emergency Use Authorization (EUA). This EUA will remain  in effect (meaning this test can be used) for the duration of the COVID-19 declaration under Section 56 4(b)(1) of the Act, 21 U.S.C. section 360bbb-3(b)(1), unless the authorization is terminated or revoked sooner. Performed at [facility], [place] [address] [place],  [address]     Radiology Reports CT Head Wo Contrast  Result Date: 02/17/2019 CLINICAL DATA:  79 year old female with fall. EXAM: CT HEAD WITHOUT CONTRAST CT CERVICAL SPINE WITHOUT CONTRAST TECHNIQUE: Multidetector CT imaging of the head and cervical spine was performed following the standard protocol without  intravenous contrast. Multiplanar CT image reconstructions of the cervical spine were also generated. COMPARISON:  Head CT dated 06/20/2014. FINDINGS: CT HEAD FINDINGS Brain: The ventricles and sulci appropriate size for patient's age. Minimal periventricular and deep white matter chronic microvascular ischemic changes noted. There is no acute intracranial hemorrhage. No mass effect or midline shift. No extra-axial fluid collection. Vascular: No hyperdense vessel or unexpected calcification. Skull: Normal. Negative for fracture or focal lesion. Sinuses/Orbits: No acute finding. Other: None CT CERVICAL SPINE FINDINGS Alignment: No acute subluxation. There is straightening of normal cervical lordosis which may be positional or due to muscle spasm. There is grade 1 C3-C4 retrolisthesis. Skull base and vertebrae: No acute fracture. No primary bone lesion or focal pathologic process. Soft tissues and spinal canal: No prevertebral fluid or swelling. No visible canal hematoma. Disc levels: Multilevel degenerative changes. There is a large disc osteophyte complex at C4-C5 with mild narrowing of the central canal and moderate bilateral  neural foramina narrowing. Upper chest: Negative. Other: Advanced left carotid bulb calcified plaque. IMPRESSION: 1. No acute intracranial pathology. 2. No acute/traumatic cervical spine pathology. Multilevel degenerative changes most prominent at C4-C5. Electronically Signed   By: Anner Crete M.D.   On: 02/17/2019 21:46   CT Cervical Spine Wo Contrast  Result Date: 02/17/2019 CLINICAL DATA:  79 year old female with fall. EXAM: CT HEAD WITHOUT CONTRAST CT CERVICAL SPINE WITHOUT CONTRAST TECHNIQUE: Multidetector CT imaging of the head and cervical spine was performed following the standard protocol without intravenous contrast. Multiplanar CT image reconstructions of the cervical spine were also generated. COMPARISON:  Head CT dated 06/20/2014. FINDINGS: CT HEAD FINDINGS Brain: The ventricles and sulci appropriate size for patient's age. Minimal periventricular and deep white matter chronic microvascular ischemic changes noted. There is no acute intracranial hemorrhage. No mass effect or midline shift. No extra-axial fluid collection. Vascular: No hyperdense vessel or unexpected calcification. Skull: Normal. Negative for fracture or focal lesion. Sinuses/Orbits: No acute finding. Other: None CT CERVICAL SPINE FINDINGS Alignment: No acute subluxation. There is straightening of normal cervical lordosis which may be positional or due to muscle spasm. There is grade 1 C3-C4 retrolisthesis. Skull base and vertebrae: No acute fracture. No primary bone lesion or focal pathologic process. Soft tissues and spinal canal: No prevertebral fluid or swelling. No visible canal hematoma. Disc levels: Multilevel degenerative changes. There is a large disc osteophyte complex at C4-C5 with mild narrowing of the central canal and moderate bilateral neural foramina narrowing. Upper chest: Negative. Other: Advanced left carotid bulb calcified plaque. IMPRESSION: 1. No acute intracranial pathology. 2. No acute/traumatic cervical  spine pathology. Multilevel degenerative changes most prominent at C4-C5. Electronically Signed   By: Anner Crete M.D.   On: 02/17/2019 21:46   ECHOCARDIOGRAM COMPLETE  Result Date: 02/18/2019   ECHOCARDIOGRAM REPORT   Patient Name:   [name] Date of Exam: 02/18/2019 Medical Rec #:  [id number]    Height:       65.0 in Accession #:    [id number]   Weight:       155.2 lb Date of Birth:  March 27, 1940     BSA:          1.78 m Patient Age:    62 years     BP:           149/72 mmHg Patient Gender: F            HR:           61 bpm. Exam Location:  Inpatient Procedure: 2D Echo Indications:    Syncope R55  History:        Patient has no prior history of Echocardiogram examinations.                 Risk Factors:Hypertension and Diabetes.  Sonographer:    Mikki Santee RDCS (AE) Referring Phys: [place]  1. Mitral valve anterior mitral leaflet tip vegetation 1 x 0.7 cm. 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 Consider blood cultures and ID consultation as well as cardiology consultation.  2. The mitral valve is abnormal. Trivial mitral valve regurgitation. No evidence of mitral stenosis.  3. Left ventricular ejection fraction, by visual estimation, is 60 to 65%. The left ventricle has normal function. There is moderately increased left ventricular hypertrophy.  4. Elevated left ventricular end-diastolic pressure.  5. Left ventricular diastolic parameters are consistent with Grade I diastolic dysfunction (impaired relaxation).  6. Global right ventricle has normal systolic function.The right ventricular size is normal. No increase in right ventricular wall thickness.  7. Left atrial size was normal.  8. Right atrial size was normal.  9. The tricuspid valve is normal in structure. Tricuspid valve regurgitation is mild-moderate. 10. The aortic valve is tricuspid. Aortic valve regurgitation is  not visualized. No evidence of aortic valve sclerosis or stenosis. 11. The pulmonic valve was normal in structure. Pulmonic valve regurgitation is mild. FINDINGS  Left Ventricle: Left ventricular ejection fraction, by visual estimation, is 60 to 65%. The left ventricle has normal function. The left ventricle has no regional wall motion abnormalities. There is moderately increased left ventricular hypertrophy. Left ventricular diastolic parameters are consistent with Grade I diastolic dysfunction (impaired relaxation). Elevated left ventricular end-diastolic pressure. Right Ventricle: The right ventricular size is normal. No increase in right ventricular wall thickness. Global RV systolic function is has normal systolic function. Left Atrium: Left atrial size was normal in size. Right Atrium: Right atrial size was normal in size Pericardium: There is no evidence of pericardial effusion. Mitral Valve: The mitral valve is abnormal. Trivial mitral valve regurgitation. No evidence of mitral valve stenosis by observation. Mitral valve anterior mitral leaflet tip vegetation 1 x 0.7 cm. 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 Consider blood cultures and ID consultation as well as cardiology consultation. Tricuspid Valve: The tricuspid valve is normal in structure. Tricuspid valve regurgitation is mild-moderate. Aortic Valve: The aortic valve is tricuspid. Aortic valve regurgitation is not visualized. The aortic valve is structurally normal, with no evidence of sclerosis or stenosis. Pulmonic Valve: The pulmonic valve was normal in structure. Pulmonic valve regurgitation is mild. Pulmonic regurgitation is mild. Aorta: The aortic root is normal in size and structure. Venous: The inferior vena cava was not well visualized. IAS/Shunts: The interatrial septum was not well visualized. There is no evidence of a patent foramen  ovale. No ventricular septal defect is seen or detected. There is no evidence of an atrial septal defect.  LEFT VENTRICLE PLAX 2D LVIDd:         2.90 cm  Diastology LVIDs:         2.20 cm  LV e' lateral:   5.87 cm/s LV PW:         1.30 cm  LV E/e' lateral: 11.8 LV IVS:        1.40 cm  LV e' medial:  3.86 cm/s LVOT diam:     2.30 cm  LV E/e' medial:  18.0 LV SV:         16 ml LV SV Index:   8.84 LVOT Area:     4.15 cm  RIGHT VENTRICLE RV S prime:     8.76 cm/s TAPSE (M-mode): 1.0 cm LEFT ATRIUM             Index       RIGHT ATRIUM           Index LA diam:        3.40 cm 1.91 cm/m  RA Area:     10.00 cm LA Vol (A2C):   39.0 ml 21.96 ml/m RA Volume:   15.60 ml  8.78 ml/m LA Vol (A4C):   30.2 ml 17.00 ml/m LA Biplane Vol: 35.0 ml 19.71 ml/m  AORTIC VALVE LVOT Vmax:   87.50 cm/s LVOT Vmean:  60.100 cm/s LVOT VTI:    0.197 m  AORTA Ao Root diam: 3.00 cm MITRAL VALVE                         TRICUSPID VALVE MV Area (PHT): 1.71 cm              TR Peak grad:   20.9 mmHg MV PHT:        128.76 msec           TR Vmax:        259.00 cm/s MV Decel Time: 444 msec MV E velocity: 69.50 cm/s  103 cm/s  SHUNTS MV A velocity: 113.00 cm/s 70.3 cm/s Systemic VTI:  0.20 m MV E/A ratio:  0.62        1.5       Systemic Diam: 2.30 cm  Cherlynn Kaiser MD Electronically signed by Cherlynn Kaiser MD Signature Date/Time: 02/18/2019/11:32:52 AM    Final    VAS US CAROTID  Result Date: 02/18/2019 Carotid Arterial Duplex Study Indications:       Syncope. Risk Factors:      Hypertension, Diabetes. Comparison Study:  No prior study. Performing Technologist: Maudry Mayhew MHA, RDMS, RVT, RDCS  Examination Guidelines: A complete evaluation includes B-mode imaging, spectral Doppler, color Doppler, and power Doppler as needed of all accessible portions of each vessel. Bilateral testing is considered an integral part of a complete examination. Limited examinations for reoccurring indications may be performed as noted.  Right Carotid  Findings: +----------+--------+-------+--------+--------------------------------+--------+             PSV cm/s EDV     Stenosis Plaque Description               Comments                       cm/s                                                        +----------+--------+-------+--------+--------------------------------+--------+  CCA Prox   95       10               smooth and heterogenous                    +----------+--------+-------+--------+--------------------------------+--------+  CCA Distal 56  9                smooth and heterogenous                    +----------+--------+-------+--------+--------------------------------+--------+  ICA Prox   53       14               heterogenous, smooth and                                                         calcific                                   +----------+--------+-------+--------+--------------------------------+--------+  ICA Distal 150      33                                                          +----------+--------+-------+--------+--------------------------------+--------+  ECA        65       5                smooth, heterogenous and                                                         calcific                                   +----------+--------+-------+--------+--------------------------------+--------+ +----------+--------+-------+----------------+-------------------+             PSV cm/s EDV cms Describe         Arm Pressure (mmHG)  +----------+--------+-------+----------------+-------------------+  Subclavian 101              Multiphasic, WNL                      +----------+--------+-------+----------------+-------------------+ +---------+--------+--+--------+--+---------+  Vertebral PSV cm/s 66 EDV cm/s 12 Antegrade  +---------+--------+--+--------+--+---------+  Left Carotid Findings: +----------+--------+--------+--------+-----------------------+--------+             PSV cm/s EDV cm/s Stenosis Plaque Description      Comments   +----------+--------+--------+--------+-----------------------+--------+  CCA Prox   86       15                                                  +----------+--------+--------+--------+-----------------------+--------+  CCA Distal 68       21                smooth and heterogenous           +----------+--------+--------+--------+-----------------------+--------+  ICA Prox   55       13  calcific                          +----------+--------+--------+--------+-----------------------+--------+  ICA Distal 64       20                                                  +----------+--------+--------+--------+-----------------------+--------+  ECA        63       7                 calcific                          +----------+--------+--------+--------+-----------------------+--------+ +----------+--------+--------+----------------+-------------------+             PSV cm/s EDV cm/s Describe         Arm Pressure (mmHG)  +----------+--------+--------+----------------+-------------------+  Subclavian 99                Multiphasic, WNL                      +----------+--------+--------+----------------+-------------------+ +---------+--------+--+--------+--+---------+  Vertebral PSV cm/s 78 EDV cm/s 17 Antegrade  +---------+--------+--+--------+--+---------+   Summary: Right Carotid: Velocities in the right ICA are consistent with a 1-39% stenosis. Left Carotid: Velocities in the left ICA are consistent with a 1-39% stenosis. Vertebrals:  Bilateral vertebral arteries demonstrate antegrade flow. Subclavians: Normal flow hemodynamics were seen in bilateral subclavian              arteries. *See table(s) above for measurements and observations.  Electronically signed by Ruta Hinds MD on 02/18/2019 at 9:37:05 AM.    Final     Lab Data:  CBC: Recent Labs  Lab 02/17/19 2117 02/18/19 0233  WBC 5.7 5.7  NEUTROABS 3.7  --   HGB 12.7 12.2  HCT 37.5 36.8  MCV 88.0 88.9  PLT 238 [id number]   Basic Metabolic  Panel: Recent Labs  Lab 02/17/19 2117 02/18/19 0233  NA 121* 126*  K 3.6 4.4  CL 88* 94*  CO2 23 22  GLUCOSE 63* 50*  BUN 13 13  CREATININE 1.34* 1.23*  CALCIUM 9.1 9.2   GFR: Estimated Creatinine Clearance: 38.1 mL/min (A) (by C-G formula based on SCr of 1.23 mg/dL (H)). Liver Function Tests: Recent Labs  Lab 02/18/19 0233  AST 27  ALT 19  ALKPHOS 57  BILITOT 0.3  PROT 8.0  ALBUMIN 3.8   No results for input(s): LIPASE, AMYLASE in the last 168 hours. No results for input(s): AMMONIA in the last 168 hours. Coagulation Profile: No results for input(s): INR, PROTIME in the last 168 hours. Cardiac Enzymes: No results for input(s): CKTOTAL, CKMB, CKMBINDEX, TROPONINI in the last 168 hours. BNP (last 3 results) No results for input(s): PROBNP in the last 8760 hours. HbA1C: No results for input(s): HGBA1C in the last 72 hours. CBG: Recent Labs  Lab 02/18/19 2033 02/19/19 0007 02/19/19 0403 02/19/19 0736 02/19/19 1145  GLUCAP 162* 111* 98 111* 84   Lipid Profile: No results for input(s): CHOL, HDL, LDLCALC, TRIG, CHOLHDL, LDLDIRECT in the last 72 hours. Thyroid Function Tests: Recent Labs    02/18/19 0113  TSH 4.495   Anemia Panel: No results for input(s): VITAMINB12, FOLATE, FERRITIN, TIBC, IRON, RETICCTPCT in the last 72 hours. Urine  analysis:    Component Value Date/Time   COLORURINE YELLOW 02/18/2019 0009   APPEARANCEUR CLEAR 02/18/2019 0009   LABSPEC 1.008 02/18/2019 0009   PHURINE 5.0 02/18/2019 0009   GLUCOSEU NEGATIVE 02/18/2019 0009   HGBUR NEGATIVE 02/18/2019 0009   BILIRUBINUR NEGATIVE 02/18/2019 0009   KETONESUR NEGATIVE 02/18/2019 0009   PROTEINUR NEGATIVE 02/18/2019 0009   UROBILINOGEN 0.2 06/20/2014 1842   NITRITE NEGATIVE 02/18/2019 0009   LEUKOCYTESUR MODERATE (A) 02/18/2019 0009     Benito Mccreedy M.D. [facility] 02/19/2019, 12:25 PM  Pager: [phone] Between 7am to 7pm - call Pager - [phone]  After 7pm go to  [email or web address] - password [email or web address]  Call night coverage person covering after 7pm

## 2019-02-19 NOTE — Plan of Care (Signed)
  Problem: Pain Managment: Goal: General experience of comfort will improve Outcome: Progressing   Problem: Safety: Goal: Ability to remain free from injury will improve Outcome: Progressing   Problem: Skin Integrity: Goal: Risk for impaired skin integrity will decrease Outcome: Progressing   

## 2019-02-20 DIAGNOSIS — I10 Essential (primary) hypertension: Secondary | ICD-10-CM

## 2019-02-20 DIAGNOSIS — N3000 Acute cystitis without hematuria: Secondary | ICD-10-CM | POA: Diagnosis present

## 2019-02-20 DIAGNOSIS — E871 Hypo-osmolality and hyponatremia: Secondary | ICD-10-CM

## 2019-02-20 DIAGNOSIS — R55 Syncope and collapse: Secondary | ICD-10-CM

## 2019-02-20 DIAGNOSIS — I38 Endocarditis, valve unspecified: Secondary | ICD-10-CM

## 2019-02-20 DIAGNOSIS — I2510 Atherosclerotic heart disease of native coronary artery without angina pectoris: Secondary | ICD-10-CM

## 2019-02-20 HISTORY — DX: Acute cystitis without hematuria: N30.00

## 2019-02-20 LAB — COMPREHENSIVE METABOLIC PANEL
ALT: 18 U/L (ref 0–44)
AST: 23 U/L (ref 15–41)
Albumin: 3.5 g/dL (ref 3.5–5.0)
Alkaline Phosphatase: 50 U/L (ref 38–126)
Anion gap: 10 (ref 5–15)
BUN: 13 mg/dL (ref 8–23)
CO2: 24 mmol/L (ref 22–32)
Calcium: 9.4 mg/dL (ref 8.9–10.3)
Chloride: 97 mmol/L — ABNORMAL LOW (ref 98–111)
Creatinine, Ser: 1.13 mg/dL — ABNORMAL HIGH (ref 0.44–1.00)
GFR calc Af Amer: 55 mL/min — ABNORMAL LOW (ref >60)
GFR calc non Af Amer: 47 mL/min — ABNORMAL LOW (ref >60)
Glucose, Bld: 121 mg/dL — ABNORMAL HIGH (ref 70–99)
Potassium: 4.5 mmol/L (ref 3.5–5.1)
Sodium: 131 mmol/L — ABNORMAL LOW (ref 135–145)
Total Bilirubin: 0.5 mg/dL (ref 0.3–1.2)
Total Protein: 7.8 g/dL (ref 6.5–8.1)

## 2019-02-20 LAB — GLUCOSE, CAPILLARY
Glucose-Capillary: 38 mg/dL — CL (ref 70–99)
Glucose-Capillary: 142 mg/dL — ABNORMAL HIGH (ref 70–99)
Glucose-Capillary: 55 mg/dL — ABNORMAL LOW (ref 70–99)
Glucose-Capillary: 114 mg/dL — ABNORMAL HIGH (ref 70–99)

## 2019-02-20 LAB — LIPID PANEL
Cholesterol: 152 mg/dL (ref 0–200)
HDL: 37 mg/dL — ABNORMAL LOW (ref >40)
LDL Cholesterol: 88 mg/dL (ref 0–99)
Total CHOL/HDL Ratio: 4.1 RATIO
Triglycerides: 134 mg/dL (ref <150)
VLDL: 27 mg/dL (ref 0–40)

## 2019-02-20 LAB — CBC
HCT: 33.3 % — ABNORMAL LOW (ref 36.0–46.0)
Hemoglobin: 11.3 g/dL — ABNORMAL LOW (ref 12.0–15.0)
MCH: 30 pg (ref 26.0–34.0)
MCHC: 33.9 g/dL (ref 30.0–36.0)
MCV: 88.3 fL (ref 80.0–100.0)
Platelets: 212 10*3/uL (ref 150–400)
RBC: 3.77 MIL/uL — ABNORMAL LOW (ref 3.87–5.11)
RDW: 13 % (ref 11.5–15.5)
WBC: 4.8 10*3/uL (ref 4.0–10.5)
nRBC: 0 % (ref 0.0–0.2)

## 2019-02-20 LAB — ANTITHROMBIN III: AntiThromb III Func: 83 % (ref 75–120)

## 2019-02-20 MED ORDER — RIVAROXABAN 15 MG PO TABS: 15.0000 mg | ORAL_TABLET | Freq: Every day | ORAL | 0 refills | Status: DC

## 2019-02-20 MED ORDER — ROSUVASTATIN CALCIUM 20 MG PO TABS: 20.0000 mg | ORAL_TABLET | Freq: Every day | ORAL | 3 refills | Status: DC

## 2019-02-20 MED ORDER — RIVAROXABAN 15 MG PO TABS
15.0000 mg | ORAL_TABLET | Freq: Every day | ORAL | Status: DC
  Administered 2019-02-20: 15 mg via ORAL
  Filled 2019-02-20: qty 1

## 2019-02-20 NOTE — Discharge Summary (Signed)
Physician Discharge Summary  [name] [id number] DOB: Jan 08, 1941 DOA: 02/17/2019  PCP: Benito Mccreedy, MD  Admit date: 02/17/2019 Discharge date: 02/20/2019  Admitted From: Home Disposition:  Home  Recommendations for Outpatient Follow-up:  1. Follow up with PCP in 1 week 2. Follow up with Dr Einar Gip of cardiology in 2 weeks 3. Please obtain BMP to ensure improvement of sodium and a CBC due to anemia within 1 week.  4. Please follow up on the following pending results:   Home Health: N/A Equipment/Devices: Cane   Discharge Condition: Good CODE STATUS: Full  Diet recommendation: General  Brief/Interim Summary: From H&P:  "[name]  is a 79 y.o. female,  Hypothyroidism, Hypertension, Dm2, Lupus,  H/o breast cancer, presents due to near syncope,  Felt dizzy as she was walking from the bedroom to the bathroom pt denies headache other than the back of the head that she stuck on ground.  Pt denies cp, palp, sob, n/v, diarrhea, brbpr, black stool, dysuria, hematuria, focal weakness, numbness, tingling. Pt denies any recent medication change other than increase in glimepiride." Kathryn Gravel, MD  Interim: CT head neg. Na came up to 131 from 121. Tx for UTI w Rocephin. Echo showed mitral leaflet tip vegetation.  Subjective on day of discharge: Reports improving, still somewhat weak. No recurrent episodes of near syncope. No urinary complaints or fevers.   Discharge Diagnoses:  Principal Problem:   Hyponatremia Active Problems:   Hypothyroidism   HTN (hypertension)   Lupus (HCC)   Near syncope   Acute cystitis   Discharge Instructions  Discharge Instructions    Call MD for:  extreme fatigue   Complete by: As directed    Call MD for:  persistant dizziness or light-headedness   Complete by: As directed    Call MD for:  persistant nausea and vomiting   Complete by: As directed    Call MD for:  temperature >100.4   Complete by: As directed    Diet - low sodium heart  healthy   Complete by: As directed    Increase activity slowly   Complete by: As directed      Allergies as of 02/20/2019      Reactions   Ciprofloxacin Itching, Swelling, Other (See Comments)   Possibly causing tremors?      Medication List    TAKE these medications   capsaicin 0.025 % cream Commonly known as: ZOSTRIX Apply topically 3 (three) times daily as needed (for pain).   gabapentin 300 MG capsule Commonly known as: NEURONTIN Take 300 mg by mouth 2 (two) times daily.   glimepiride 2 MG tablet Commonly known as: AMARYL Take 2 mg by mouth daily.   hydroxychloroquine 200 MG tablet Commonly known as: PLAQUENIL Take 1 tablet (200 mg total) by mouth daily.   levothyroxine 75 MCG tablet Commonly known as: SYNTHROID Take 75 mcg by mouth daily before breakfast.   losartan-hydrochlorothiazide 50-12.5 MG tablet Commonly known as: HYZAAR Take 1 tablet by mouth daily.   omeprazole 20 MG capsule Commonly known as: PRILOSEC Take 20 mg by mouth daily.   Rivaroxaban 15 MG Tabs tablet Commonly known as: XARELTO Take 1 tablet (15 mg total) by mouth daily with supper. Start taking on: February 21, 2019   rosuvastatin 20 MG tablet Commonly known as: Crestor Take 1 tablet (20 mg total) by mouth daily.   Tiotropium Bromide Monohydrate 1.25 MCG/ACT Aers Commonly known as: Spiriva Respimat Inhale 2 puffs into the lungs daily.   Vitamin D (  Ergocalciferol) 1.25 MG (50000 UNIT) Caps capsule Commonly known as: DRISDOL Take 50,000 Units by mouth every Sunday.            Durable Medical Equipment  (From admission, onward)         Start     Ordered   02/20/19 1152  For home use only DME Cane  Once     01 /25/21 1151         Follow-up Information    Adrian Prows, MD. Call.   Specialty: Cardiology Why: To be seen in 3-4 weeks for cardiac conditions Contact information: [address] [place] [address] [phone]          Allergies  Allergen  Reactions  . Ciprofloxacin Itching, Swelling and Other (See Comments)    Possibly causing tremors?    Consultations:  Cardiology, Dr Adrian Prows   Procedures/Studies: CT Head Wo Contrast  Result Date: 02/17/2019 CLINICAL DATA:  79 year old female with fall. EXAM: CT HEAD WITHOUT CONTRAST CT CERVICAL SPINE WITHOUT CONTRAST TECHNIQUE: Multidetector CT imaging of the head and cervical spine was performed following the standard protocol without intravenous contrast. Multiplanar CT image reconstructions of the cervical spine were also generated. COMPARISON:  Head CT dated 06/20/2014. FINDINGS: CT HEAD FINDINGS Brain: The ventricles and sulci appropriate size for patient's age. Minimal periventricular and deep white matter chronic microvascular ischemic changes noted. There is no acute intracranial hemorrhage. No mass effect or midline shift. No extra-axial fluid collection. Vascular: No hyperdense vessel or unexpected calcification. Skull: Normal. Negative for fracture or focal lesion. Sinuses/Orbits: No acute finding. Other: None CT CERVICAL SPINE FINDINGS Alignment: No acute subluxation. There is straightening of normal cervical lordosis which may be positional or due to muscle spasm. There is grade 1 C3-C4 retrolisthesis. Skull base and vertebrae: No acute fracture. No primary bone lesion or focal pathologic process. Soft tissues and spinal canal: No prevertebral fluid or swelling. No visible canal hematoma. Disc levels: Multilevel degenerative changes. There is a large disc osteophyte complex at C4-C5 with mild narrowing of the central canal and moderate bilateral neural foramina narrowing. Upper chest: Negative. Other: Advanced left carotid bulb calcified plaque. IMPRESSION: 1. No acute intracranial pathology. 2. No acute/traumatic cervical spine pathology. Multilevel degenerative changes most prominent at C4-C5. Electronically Signed   By: Anner Crete M.D.   On: 02/17/2019 21:46   CT Cervical  Spine Wo Contrast  Result Date: 02/17/2019 CLINICAL DATA:  79 year old female with fall. EXAM: CT HEAD WITHOUT CONTRAST CT CERVICAL SPINE WITHOUT CONTRAST TECHNIQUE: Multidetector CT imaging of the head and cervical spine was performed following the standard protocol without intravenous contrast. Multiplanar CT image reconstructions of the cervical spine were also generated. COMPARISON:  Head CT dated 06/20/2014. FINDINGS: CT HEAD FINDINGS Brain: The ventricles and sulci appropriate size for patient's age. Minimal periventricular and deep white matter chronic microvascular ischemic changes noted. There is no acute intracranial hemorrhage. No mass effect or midline shift. No extra-axial fluid collection. Vascular: No hyperdense vessel or unexpected calcification. Skull: Normal. Negative for fracture or focal lesion. Sinuses/Orbits: No acute finding. Other: None CT CERVICAL SPINE FINDINGS Alignment: No acute subluxation. There is straightening of normal cervical lordosis which may be positional or due to muscle spasm. There is grade 1 C3-C4 retrolisthesis. Skull base and vertebrae: No acute fracture. No primary bone lesion or focal pathologic process. Soft tissues and spinal canal: No prevertebral fluid or swelling. No visible canal hematoma. Disc levels: Multilevel degenerative changes. There is a large  disc osteophyte complex at C4-C5 with mild narrowing of the central canal and moderate bilateral neural foramina narrowing. Upper chest: Negative. Other: Advanced left carotid bulb calcified plaque. IMPRESSION: 1. No acute intracranial pathology. 2. No acute/traumatic cervical spine pathology. Multilevel degenerative changes most prominent at C4-C5. Electronically Signed   By: Anner Crete M.D.   On: 02/17/2019 21:46   ECHOCARDIOGRAM COMPLETE  Result Date: 02/18/2019   ECHOCARDIOGRAM REPORT   Patient Name:   [name] Date of Exam: 02/18/2019 Medical Rec #:  [id number]    Height:       65.0 in Accession #:     [id number]   Weight:       155.2 lb Date of Birth:  06-21-1940     BSA:          1.78 m Patient Age:    42 years     BP:           149/72 mmHg Patient Gender: F            HR:           61 bpm. Exam Location:  Inpatient Procedure: 2D Echo Indications:    Syncope R55  History:        Patient has no prior history of Echocardiogram examinations.                 Risk Factors:Hypertension and Diabetes.  Sonographer:    Mikki Santee RDCS (AE) Referring Phys: [place]  1. Mitral valve anterior mitral leaflet tip vegetation 1 x 0.7 cm. 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 Consider blood cultures and ID consultation as well as cardiology consultation.  2. The mitral valve is abnormal. Trivial mitral valve regurgitation. No evidence of mitral stenosis.  3. Left ventricular ejection fraction, by visual estimation, is 60 to 65%. The left ventricle has normal function. There is moderately increased left ventricular hypertrophy.  4. Elevated left ventricular end-diastolic pressure.  5. Left ventricular diastolic parameters are consistent with Grade I diastolic dysfunction (impaired relaxation).  6. Global right ventricle has normal systolic function.The right ventricular size is normal. No increase in right ventricular wall thickness.  7. Left atrial size was normal.  8. Right atrial size was normal.  9. The tricuspid valve is normal in structure. Tricuspid valve regurgitation is mild-moderate. 10. The aortic valve is tricuspid. Aortic valve regurgitation is not visualized. No evidence of aortic valve sclerosis or stenosis. 11. The pulmonic valve was normal in structure. Pulmonic valve regurgitation is mild. FINDINGS  Left Ventricle: Left ventricular ejection fraction, by visual estimation, is 60 to 65%. The left ventricle has normal function. The left ventricle has no regional wall motion  abnormalities. There is moderately increased left ventricular hypertrophy. Left ventricular diastolic parameters are consistent with Grade I diastolic dysfunction (impaired relaxation). Elevated left ventricular end-diastolic pressure. Right Ventricle: The right ventricular size is normal. No increase in right ventricular wall thickness. Global RV systolic function is has normal systolic function. Left Atrium: Left atrial size was normal in size. Right Atrium: Right atrial size was normal in size Pericardium: There is no evidence of pericardial effusion. Mitral Valve: The mitral valve is abnormal. Trivial mitral valve regurgitation. No evidence of mitral valve stenosis by observation. Mitral valve anterior mitral leaflet tip vegetation 1 x 0.7 cm. 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 Consider blood cultures and ID consultation as well as cardiology consultation. Tricuspid Valve: The tricuspid valve is normal in structure. Tricuspid valve regurgitation is mild-moderate. Aortic Valve: The aortic valve is tricuspid. Aortic valve regurgitation is not visualized. The aortic valve is structurally normal, with no evidence of sclerosis or stenosis. Pulmonic Valve: The pulmonic valve was normal in structure. Pulmonic valve regurgitation is mild. Pulmonic regurgitation is mild. Aorta: The aortic root is normal in size and structure. Venous: The inferior vena cava was not well visualized. IAS/Shunts: The interatrial septum was not well visualized. There is no evidence of a patent foramen ovale. No ventricular septal defect is seen or detected. There is no evidence of an atrial septal defect.  LEFT VENTRICLE PLAX 2D LVIDd:         2.90 cm  Diastology LVIDs:         2.20 cm  LV e' lateral:   5.87 cm/s LV PW:         1.30 cm  LV E/e' lateral: 11.8 LV IVS:        1.40 cm  LV e' medial:    3.86 cm/s LVOT diam:     2.30 cm   LV E/e' medial:  18.0 LV SV:         16 ml LV SV Index:   8.84 LVOT Area:     4.15 cm  RIGHT VENTRICLE RV S prime:     8.76 cm/s TAPSE (M-mode): 1.0 cm LEFT ATRIUM             Index       RIGHT ATRIUM           Index LA diam:        3.40 cm 1.91 cm/m  RA Area:     10.00 cm LA Vol (A2C):   39.0 ml 21.96 ml/m RA Volume:   15.60 ml  8.78 ml/m LA Vol (A4C):   30.2 ml 17.00 ml/m LA Biplane Vol: 35.0 ml 19.71 ml/m  AORTIC VALVE LVOT Vmax:   87.50 cm/s LVOT Vmean:  60.100 cm/s LVOT VTI:    0.197 m  AORTA Ao Root diam: 3.00 cm MITRAL VALVE                         TRICUSPID VALVE MV Area (PHT): 1.71 cm              TR Peak grad:   20.9 mmHg MV PHT:        128.76 msec           TR Vmax:        259.00 cm/s MV Decel Time: 444 msec MV E velocity: 69.50 cm/s  103 cm/s  SHUNTS MV A velocity: 113.00 cm/s 70.3 cm/s Systemic VTI:  0.20 m MV E/A ratio:  0.62        1.5       Systemic Diam: 2.30 cm  Cherlynn Kaiser MD Electronically signed by Cherlynn Kaiser MD Signature Date/Time: 02/18/2019/11:32:52 AM    Final    VAS US CAROTID  Result Date: 02/18/2019 Carotid Arterial Duplex Study Indications:       Syncope. Risk Factors:      Hypertension, Diabetes. Comparison Study:  No prior study. Performing Technologist: Maudry Mayhew MHA, RDMS, RVT, RDCS  Examination Guidelines: A complete evaluation includes B-mode imaging, spectral Doppler, color Doppler, and power Doppler as needed of all accessible portions of each vessel. Bilateral testing is considered an  integral part of a complete examination. Limited examinations for reoccurring indications may be performed as noted.  Right Carotid Findings: +----------+--------+-------+--------+--------------------------------+--------+           PSV cm/sEDV    StenosisPlaque Description              Comments                   cm/s                                                    +----------+--------+-------+--------+--------------------------------+--------+ CCA  Prox  95      10             smooth and heterogenous                  +----------+--------+-------+--------+--------------------------------+--------+ CCA Distal56      9              smooth and heterogenous                  +----------+--------+-------+--------+--------------------------------+--------+ ICA Prox  53      14             heterogenous, smooth and                                                  calcific                                 +----------+--------+-------+--------+--------------------------------+--------+ ICA Distal150     33                                                      +----------+--------+-------+--------+--------------------------------+--------+ ECA       65      5              smooth, heterogenous and                                                  calcific                                 +----------+--------+-------+--------+--------------------------------+--------+ +----------+--------+-------+----------------+-------------------+           PSV cm/sEDV cmsDescribe        Arm Pressure (mmHG) +----------+--------+-------+----------------+-------------------+ Subclavian101            Multiphasic, WNL                    +----------+--------+-------+----------------+-------------------+ +---------+--------+--+--------+--+---------+ VertebralPSV cm/s66EDV cm/s12Antegrade +---------+--------+--+--------+--+---------+  Left Carotid Findings: +----------+--------+--------+--------+-----------------------+--------+           PSV cm/sEDV cm/sStenosisPlaque Description     Comments +----------+--------+--------+--------+-----------------------+--------+ CCA Prox  86      15                                              +----------+--------+--------+--------+-----------------------+--------+  CCA Distal68      21              smooth and heterogenous          +----------+--------+--------+--------+-----------------------+--------+ ICA Prox  55      13              calcific                        +----------+--------+--------+--------+-----------------------+--------+ ICA Distal64      20                                              +----------+--------+--------+--------+-----------------------+--------+ ECA       63      7               calcific                        +----------+--------+--------+--------+-----------------------+--------+ +----------+--------+--------+----------------+-------------------+           PSV cm/sEDV cm/sDescribe        Arm Pressure (mmHG) +----------+--------+--------+----------------+-------------------+ [id number]              Multiphasic, WNL                    +----------+--------+--------+----------------+-------------------+ +---------+--------+--+--------+--+---------+ VertebralPSV cm/s78EDV cm/s17Antegrade +---------+--------+--+--------+--+---------+   Summary: Right Carotid: Velocities in the right ICA are consistent with a 1-39% stenosis. Left Carotid: Velocities in the left ICA are consistent with a 1-39% stenosis. Vertebrals:  Bilateral vertebral arteries demonstrate antegrade flow. Subclavians: Normal flow hemodynamics were seen in bilateral subclavian              arteries. *See table(s) above for measurements and observations.  Electronically signed by Ruta Hinds MD on 02/18/2019 at 9:37:05 AM.    Final       Discharge Exam: Vitals:   02/20/19 0412 02/20/19 0757  BP: 124/68 139/75  Pulse: 80 69  Resp: 16 17  Temp: 99 F (37.2 C) 98.5 F (36.9 C)  SpO2: 100% 100%    General: Pt is alert, awake, not in acute distress Cardiovascular: RRR, S1/S2 +, no edema Respiratory: CTA bilaterally, no wheezing, no rhonchi, no respiratory distress, no conversational dyspnea  Abdominal: Soft, NT, ND, bowel sounds + Extremities: no edema, no cyanosis Psych: Normal mood and  affect, stable judgement and insight     The results of significant diagnostics from this hospitalization (including imaging, microbiology, ancillary and laboratory) are listed below for reference.     Microbiology: Recent Results (from the past 240 hour(s))  SARS CORONAVIRUS 2 (TAT 6-24 HRS) Nasopharyngeal Nasopharyngeal Swab     Status: None   Collection Time: 02/18/19 12:03 AM   Specimen: Nasopharyngeal Swab  Result Value Ref Range Status   SARS Coronavirus 2 NEGATIVE NEGATIVE Final    Comment: (NOTE) SARS-CoV-2 target nucleic acids are NOT DETECTED. The SARS-CoV-2 RNA is generally detectable in upper and lower respiratory specimens during the acute phase of infection. Negative results do not preclude SARS-CoV-2 infection, do not rule out co-infections with other pathogens, and should not be used as the sole basis for treatment or other patient management decisions. Negative results must be combined with clinical observations, patient history, and epidemiological information. The expected result is Negative. Fact Sheet for Patients: SugarRoll.be Fact Sheet for Healthcare Providers: https://www.woods-mathews.com/  This test is not yet approved or cleared by the [place] and  has been authorized for detection and/or diagnosis of SARS-CoV-2 by FDA under an Emergency Use Authorization (EUA). This EUA will remain  in effect (meaning this test can be used) for the duration of the COVID-19 declaration under Section 56 4(b)(1) of the Act, 21 U.S.C. section 360bbb-3(b)(1), unless the authorization is terminated or revoked sooner. Performed at [facility], [place] [address] [place], [place] [address]      Labs: Basic Metabolic Panel: Recent Labs  Lab 02/17/19 2117 02/18/19 0233 02/20/19 0248  NA 121* 126* 131*  K 3.6 4.4 4.5  CL 88* 94* 97*  CO2 23 22 24   GLUCOSE 63* 50* 121*  BUN 13 13 13   CREATININE 1.34* 1.23* 1.13*   CALCIUM 9.1 9.2 9.4   Liver Function Tests: Recent Labs  Lab 02/18/19 0233 02/20/19 0248  AST 27 23  ALT 19 18  ALKPHOS 57 50  BILITOT 0.3 0.5  PROT 8.0 7.8  ALBUMIN 3.8 3.5   CBC: Recent Labs  Lab 02/17/19 2117 02/18/19 0233 02/20/19 0248  WBC 5.7 5.7 4.8  NEUTROABS 3.7  --   --   HGB 12.7 12.2 11.3*  HCT 37.5 36.8 33.3*  MCV 88.0 88.9 88.3  PLT 238 225 212   CBG: Recent Labs  Lab 02/19/19 1625 02/19/19 2110 02/20/19 0644 02/20/19 1212 02/20/19 1300  GLUCAP 99 125* 142* 55* 114*   Lipid Profile Recent Labs    02/20/19 0814  CHOL 152  HDL 37*  LDLCALC 88  TRIG 134  CHOLHDL 4.1   Thyroid function studies Recent Labs    02/18/19 0113  TSH 4.495   Urinalysis    Component Value Date/Time   COLORURINE YELLOW 02/18/2019 0009   APPEARANCEUR CLEAR 02/18/2019 0009   LABSPEC 1.008 02/18/2019 0009   PHURINE 5.0 02/18/2019 0009   GLUCOSEU NEGATIVE 02/18/2019 0009   HGBUR NEGATIVE 02/18/2019 0009   BILIRUBINUR NEGATIVE 02/18/2019 0009   KETONESUR NEGATIVE 02/18/2019 0009   PROTEINUR NEGATIVE 02/18/2019 0009   UROBILINOGEN 0.2 06/20/2014 1842   NITRITE NEGATIVE 02/18/2019 0009   LEUKOCYTESUR MODERATE (A) 02/18/2019 0009   Microbiology Recent Results (from the past 240 hour(s))  SARS CORONAVIRUS 2 (TAT 6-24 HRS) Nasopharyngeal Nasopharyngeal Swab     Status: None   Collection Time: 02/18/19 12:03 AM   Specimen: Nasopharyngeal Swab  Result Value Ref Range Status   SARS Coronavirus 2 NEGATIVE NEGATIVE Final    Comment: (NOTE) SARS-CoV-2 target nucleic acids are NOT DETECTED. The SARS-CoV-2 RNA is generally detectable in upper and lower respiratory specimens during the acute phase of infection. Negative results do not preclude SARS-CoV-2 infection, do not rule out co-infections with other pathogens, and should not be used as the sole basis for treatment or other patient management decisions. Negative results must be combined with clinical  observations, patient history, and epidemiological information. The expected result is Negative. Fact Sheet for Patients: SugarRoll.be Fact Sheet for Healthcare Providers: https://www.woods-mathews.com/ This test is not yet approved or cleared by the [place] FDA and  has been authorized for detection and/or diagnosis of SARS-CoV-2 by FDA under an Emergency Use Authorization (EUA). This EUA will remain  in effect (meaning this test can be used) for the duration of the COVID-19 declaration under Section 56 4(b)(1) of the Act, 21 U.S.C. section 360bbb-3(b)(1), unless the authorization is terminated or revoked sooner. Performed at [facility], [place],  [place] [address]      Patient was seen and examined on the day of discharge and was found to be in stable condition. Time coordinating discharge: 32 minutes including assessment and coordination of care, as well as examination of the patient.   SIGNED:  Shelda Pal, DO [facility] 02/20/2019, 1:54 PM

## 2019-02-20 NOTE — Care Management Note (Signed)
Patient from home with family. Ordered cane through Zack with [facility].  Magdalen Spatz RN

## 2019-02-20 NOTE — Plan of Care (Signed)
  Problem: Education: Goal: Knowledge of General Education information will improve Description: Including pain rating scale, medication(s)/side effects and non-pharmacologic comfort measures 02/20/2019 1533 by Stevan Born, RN Outcome: Completed/Met 02/20/2019 1420 by Stevan Born, RN Outcome: Progressing   Problem: Health Behavior/Discharge Planning: Goal: Ability to manage health-related needs will improve 02/20/2019 1533 by Stevan Born, RN Outcome: Completed/Met 02/20/2019 1420 by Stevan Born, RN Outcome: Progressing   Problem: Clinical Measurements: Goal: Ability to maintain clinical measurements within normal limits will improve Outcome: Completed/Met Goal: Will remain free from infection 02/20/2019 1533 by Stevan Born, RN Outcome: Completed/Met 02/20/2019 1420 by Stevan Born, RN Outcome: Progressing Goal: Diagnostic test results will improve Outcome: Completed/Met Goal: Respiratory complications will improve Outcome: Completed/Met Goal: Cardiovascular complication will be avoided Outcome: Completed/Met   Problem: Activity: Goal: Risk for activity intolerance will decrease Outcome: Completed/Met   Problem: Nutrition: Goal: Adequate nutrition will be maintained Outcome: Completed/Met   Problem: Coping: Goal: Level of anxiety will decrease Outcome: Completed/Met   Problem: Elimination: Goal: Will not experience complications related to bowel motility Outcome: Completed/Met Goal: Will not experience complications related to urinary retention Outcome: Completed/Met   Problem: Pain Managment: Goal: General experience of comfort will improve 02/20/2019 1533 by Stevan Born, RN Outcome: Completed/Met 02/20/2019 1420 by Stevan Born, RN Outcome: Progressing   Problem: Safety: Goal: Ability to remain free from injury will improve 02/20/2019 1533 by Stevan Born, RN Outcome: Completed/Met 02/20/2019 1420 by Stevan Born,  RN Outcome: Progressing   Problem: Skin Integrity: Goal: Risk for impaired skin integrity will decrease 02/20/2019 1533 by Stevan Born, RN Outcome: Completed/Met 02/20/2019 1420 by Stevan Born, RN Outcome: Progressing

## 2019-02-20 NOTE — Plan of Care (Signed)
  Problem: Pain Managment: Goal: General experience of comfort will improve Outcome: Progressing   Problem: Safety: Goal: Ability to remain free from injury will improve Outcome: Progressing   Problem: Skin Integrity: Goal: Risk for impaired skin integrity will decrease Outcome: Progressing   

## 2019-02-20 NOTE — Progress Notes (Signed)
Dr. Nani Ravens notified of blood sugar- 55.  MD states to give patient juice.  MD states patient may discharge to home.

## 2019-02-20 NOTE — Plan of Care (Signed)
  Problem: Education: Goal: Knowledge of General Education information will improve Description: Including pain rating scale, medication(s)/side effects and non-pharmacologic comfort measures Outcome: Progressing   Problem: Health Behavior/Discharge Planning: Goal: Ability to manage health-related needs will improve Outcome: Progressing   Problem: Clinical Measurements: Goal: Will remain free from infection Outcome: Progressing   Problem: Pain Managment: Goal: General experience of comfort will improve Outcome: Progressing   Problem: Safety: Goal: Ability to remain free from injury will improve Outcome: Progressing   Problem: Skin Integrity: Goal: Risk for impaired skin integrity will decrease Outcome: Progressing

## 2019-02-20 NOTE — Discharge Instructions (Addendum)
You were cared for by a hospitalist during your hospital stay. If you have any questions about your discharge medications or the care you received while you were in the hospital after you are discharged, you can call the unit and ask to speak with the hospitalist on call if the hospitalist that took care of you is not available. Once you are discharged, your primary care physician will handle any further medical issues. Please note that NO REFILLS for any discharge medications will be authorized once you are discharged, as it is imperative that you return to your primary care physician (or establish a relationship with a primary care physician if you do not have one) for your aftercare needs so that they can reassess your need for medications and monitor your lab values.  Please call for an appointment in 2 weeks:  Adrian Prows, MD, [facility] Office: [place], [place], [place] [address]

## 2019-02-20 NOTE — Evaluation (Signed)
Physical Therapy Evaluation/ Discharge Patient Details Name: [name] MRN: [id number] DOB: 1940/05/16 Today's Date: 02/20/2019   History of Present Illness  79 y.o. female admitted with near syncope with UTI and hyponatremia. PMHx: Hypothyroidism, HTN, Dm2, Lupus,  H/o breast cancer  Clinical Impression  Pt very pleasant and reports no falls other than presyncope leading to admission. Pt lives with daughter, grandchildren and great grandchildren who can provide assist at D/C. Pt with higher level balance deficits with difficulty standing on one leg and educated for benefit of cane for community ambulation and need for UE support for stepping into shower or up a curb. Pt verbalized understanding and eager to return home. Encouraged daily ambulation and mobility acutely with pt making sure to allow time with positional changes for any lightheadedness acutely. Will sign off with pt aware and agreeable.     Follow Up Recommendations No PT follow up    Equipment Recommendations  Cane    Recommendations for Other Services       Precautions / Restrictions Precautions Precautions: Fall      Mobility  Bed Mobility Overal bed mobility: Modified Independent             General bed mobility comments: pt able to rise from flat bed  Transfers Overall transfer level: Modified independent               General transfer comment: able to stand from bed without assist  Ambulation/Gait Ambulation/Gait assistance: Modified independent (Device/Increase time) Gait Distance (Feet): 500 Feet Assistive device: None Gait Pattern/deviations: WFL(Within Functional Limits)   Gait velocity interpretation: >2.62 ft/sec, indicative of community ambulatory General Gait Details: pt with slightly decreased gait with good stability and ability to perform vertical and horizontal head turns as well as change of speed with decreased speed variance  Stairs            Wheelchair Mobility     Modified Rankin (Stroke Patients Only)       Balance Overall balance assessment: Mild deficits observed, not formally tested                                           Pertinent Vitals/Pain Pain Assessment: No/denies pain    Home Living Family/patient expects to be discharged to:: Private residence Living Arrangements: Children Available Help at Discharge: Family;Available 24 hours/day Type of Home: House Home Access: Stairs to enter   [employer] of Steps: 1 Home Layout: Two level;Able to live on main level with bedroom/bathroom Home Equipment: Bedside commode      Prior Function Level of Independence: Independent               Hand Dominance        Extremity/Trunk Assessment   Upper Extremity Assessment Upper Extremity Assessment: Overall WFL for tasks assessed    Lower Extremity Assessment Lower Extremity Assessment: Overall WFL for tasks assessed    Cervical / Trunk Assessment Cervical / Trunk Assessment: Normal  Communication   Communication: No difficulties  Cognition Arousal/Alertness: Awake/alert Behavior During Therapy: WFL for tasks assessed/performed Overall Cognitive Status: Within Functional Limits for tasks assessed                                        General Comments General comments (skin integrity,  edema, etc.): pt able to perform Rhomberg eyes closed 30 sec and SLS 3 sec each leg. Pt educated for higher level balance deficits and reports she has grab bar to step into shower and educated for benefit of cane for community mobility particularly with curbs or stepping over obstacles    Exercises     Assessment/Plan    PT Assessment Patent does not need any further PT services  PT Problem List         PT Treatment Interventions      PT Goals (Current goals can be found in the Care Plan section)  Acute Rehab PT Goals PT Goal Formulation: All assessment and education complete, DC  therapy    Frequency     Barriers to discharge        Co-evaluation               AM-PAC PT "6 Clicks" Mobility  Outcome Measure Help needed turning from your back to your side while in a flat bed without using bedrails?: None Help needed moving from lying on your back to sitting on the side of a flat bed without using bedrails?: None Help needed moving to and from a bed to a chair (including a wheelchair)?: None Help needed standing up from a chair using your arms (e.g., wheelchair or bedside chair)?: None Help needed to walk in hospital room?: None Help needed climbing 3-5 steps with a railing? : None 6 Click Score: 24    End of Session   Activity Tolerance: Patient tolerated treatment well Patient left: in chair;with call bell/phone within reach Nurse Communication: Mobility status PT Visit Diagnosis: Other abnormalities of gait and mobility (R26.89)    [id number] PT Time Calculation (min) (ACUTE ONLY): 16 min   Charges:   PT Evaluation $PT Eval Low Complexity: [place], PT Acute Rehabilitation Services Pager: [phone] Office: [place] 02/20/2019, 8:55 AM

## 2019-02-20 NOTE — Consult Note (Signed)
CARDIOLOGY CONSULT NOTE  Patient ID: [name] MRN: [id number] DOB/AGE: 07/30/1940 79 y.o.  Admit date: 02/17/2019 Referring Physician  Isla Pence, MD Primary Physician:  Benito Mccreedy, MD Reason for Consultation  Abnormal echo, syyncope  Patient ID: [name], female    DOB: October 05, 1940, 79 y.o.   MRN: [id number]  Chief Complaint  Patient presents with  . Near syncope and fall   HPI:    [name]  is a 79 y.o. African-American female with history of diabetes mellitus without complication, hypertension, SLE, history of remote breast cancer status post right breast lumpectomy sometime in 2015, admitted to the hospital with near syncope, initially felt dizzy while at home when she stood up.  Second time around she went to the bathroom and felt very dizzy and fell backwards, daughter called EMS and patient was brought to the emergency room.  Patient was found to be hyponatremic, also possibly UTI.  She underwent echocardiogram which was abnormal and hence cardiology consultation obtained.  Has chronic shortness of breath that is stable over the past couple years, has diagnosis of interstitial pulmonary disease, has coronary calcification by CT and a negative nuclear stress test in 2018.  No leg edema, no PND or orthopnea, no hemoptysis, no chest pain or palpitations.  Past Medical History:  Diagnosis Date  . Breast cancer ([place])   . Diabetes mellitus without complication ([place])   . Hypertension   . Hypothyroidism   . Lupus [facility])    Past Surgical History:  Procedure Laterality Date  . BREAST LUMPECTOMY  2009   Social History   Socioeconomic History  . Marital status: Single    Spouse name: Not on file  . Number of children: Not on file  . Years of education: Not on file  . Highest education level: Not on file  Occupational History  . Not on file  Tobacco Use  . Smoking status: Never Smoker  . Smokeless tobacco: Former Systems developer    Types: Snuff  Substance and Sexual  Activity  . Alcohol use: No  . Drug use: No  . Sexual activity: Not on file  Other Topics Concern  . Not on file  Social History Narrative  . Not on file   Social Determinants of Health   Financial Resource Strain:   . Difficulty of Paying Living Expenses: Not on file  Food Insecurity:   . Worried About Charity fundraiser in the Last Year: Not on file  . Ran Out of Food in the Last Year: Not on file  Transportation Needs:   . Lack of Transportation (Medical): Not on file  . Lack of Transportation (Non-Medical): Not on file  Physical Activity:   . Days of Exercise per Week: Not on file  . Minutes of Exercise per Session: Not on file  Stress:   . Feeling of Stress : Not on file  Social Connections:   . Frequency of Communication with Friends and Family: Not on file  . Frequency of Social Gatherings with Friends and Family: Not on file  . Attends Religious Services: Not on file  . Active Member of Clubs or Organizations: Not on file  . Attends Archivist Meetings: Not on file  . Marital Status: Not on file  Intimate Partner Violence:   . Fear of Current or Ex-Partner: Not on file  . Emotionally Abused: Not on file  . Physically Abused: Not on file  . Sexually Abused: Not on file   ROS  Review of  Systems  Constitution: Negative for weight gain.  Cardiovascular: Positive for dyspnea on exertion (chronic and stable). Negative for leg swelling and syncope.  Respiratory: Negative for hemoptysis.   Endocrine: Negative for cold intolerance.  Hematologic/Lymphatic: Does not bruise/bleed easily.  Gastrointestinal: Negative for hematochezia and melena.  Genitourinary: Positive for dysuria.  Neurological: Negative for headaches and light-headedness.  All other systems reviewed and are negative.  Objective   Vitals with BMI 02/20/2019 02/20/2019 02/20/2019  Height - 5\' 7"  -  Weight - 152 lbs 12 oz -  BMI - [id number] -  Systolic [id number] - [id number]  Diastolic 75 - 68  Pulse 69 - 80     Blood pressure 139/75, pulse 69, temperature 98.5 F (36.9 C), temperature source Oral, resp. rate 17, height 5\' 7"  (1.702 m), weight 69.3 kg, SpO2 100 %.    Physical Exam  Neck: No thyromegaly present.  Cardiovascular: Normal rate, regular rhythm, normal heart sounds and intact distal pulses. Exam reveals no gallop.  No murmur heard. No leg edema, no JVD.  Pulmonary/Chest: Effort normal. She has rales (bilateral coarse crackles at bases).  Abdominal: Soft. Bowel sounds are normal.  Musculoskeletal:     Cervical back: Neck supple.  Skin: Skin is warm and dry.   Laboratory examination:   Recent Labs    02/17/19 2117 02/18/19 0233 02/20/19 0248  NA 121* 126* 131*  K 3.6 4.4 4.5  CL 88* 94* 97*  CO2 23 22 24   GLUCOSE 63* 50* 121*  BUN 13 13 13   CREATININE 1.34* 1.23* 1.13*  CALCIUM 9.1 9.2 9.4  GFRNONAA 38* 43* 47*  GFRAA 44* 49* 55*   estimated creatinine clearance is 41.2 mL/min (A) (by C-G formula based on SCr of 1.13 mg/dL (H)).  CMP Latest Ref Rng & Units 02/20/2019 02/18/2019 02/17/2019  Glucose 70 - 99 mg/dL 121(H) 50(L) 63(L)  BUN 8 - 23 mg/dL 13 13 13   Creatinine 0.44 - 1.00 mg/dL 1.13(H) 1.23(H) 1.34(H)  Sodium 135 - 145 mmol/L 131(L) 126(L) 121(L)  Potassium 3.5 - 5.1 mmol/L 4.5 4.4 3.6  Chloride 98 - 111 mmol/L 97(L) 94(L) 88(L)  CO2 22 - 32 mmol/L 24 22 23   Calcium 8.9 - 10.3 mg/dL 9.4 9.2 9.1  Total Protein 6.5 - 8.1 g/dL 7.8 8.0 -  Total Bilirubin 0.3 - 1.2 mg/dL 0.5 0.3 -  Alkaline Phos 38 - 126 U/L 50 57 -  AST 15 - 41 U/L 23 27 -  ALT 0 - 44 U/L 18 19 -   CBC Latest Ref Rng & Units 02/20/2019 02/18/2019 02/17/2019  WBC 4.0 - 10.5 K/uL 4.8 5.7 5.7  Hemoglobin 12.0 - 15.0 g/dL 11.3(L) 12.2 12.7  Hematocrit 36.0 - 46.0 % 33.3(L) 36.8 37.5  Platelets 150 - 400 K/uL 212 225 238   Lipid Panel     Component Value Date/Time   CHOL 152 02/20/2019 0814   TRIG 134 02/20/2019 0814   HDL 37 (L) 02/20/2019 0814   CHOLHDL 4.1 02/20/2019 0814   VLDL 27  02/20/2019 0814   LDLCALC 88 02/20/2019 0814   HEMOGLOBIN A1C Lab Results  Component Value Date   HGBA1C 6.6 (H) 01/03/2015   MPG 143 01/03/2015   TSH Recent Labs    02/18/19 0113  TSH 4.495   BNP (last 3 results) No results for input(s): BNP in the last 8760 hours.  Medications and allergies   Allergies  Allergen Reactions  . Ciprofloxacin Itching, Swelling and Other (See Comments)    Possibly  causing tremors?     Prior to Admission medications   Medication Sig Start Date End Date Taking? Authorizing Provider  capsaicin (ZOSTRIX) 0.025 % cream Apply topically 3 (three) times daily as needed (for pain).  09/16/18  Yes [provider]  gabapentin (NEURONTIN) 300 MG capsule Take 300 mg by mouth 2 (two) times daily.   Yes [provider]  glimepiride (AMARYL) 2 MG tablet Take 2 mg by mouth daily. 02/09/19  Yes [provider]  hydroxychloroquine (PLAQUENIL) 200 MG tablet Take 1 tablet (200 mg total) by mouth daily. 01/06/15  Yes Kelvin Cellar, MD  levothyroxine (SYNTHROID, LEVOTHROID) 75 MCG tablet Take 75 mcg by mouth daily before breakfast.  02/13/18  Yes [provider]  losartan-hydrochlorothiazide (HYZAAR) 50-12.5 MG tablet Take 1 tablet by mouth daily.  01/29/17  Yes [provider]  omeprazole (PRILOSEC) 20 MG capsule Take 20 mg by mouth daily.   Yes [provider]  Tiotropium Bromide Monohydrate (SPIRIVA RESPIMAT) 1.25 MCG/ACT AERS Inhale 2 puffs into the lungs daily. 08/18/17  Yes Brand Males, MD  Vitamin D, Ergocalciferol, (DRISDOL) 1.25 MG (50000 UNIT) CAPS capsule Take 50,000 Units by mouth every Sunday.  02/09/19  Yes [provider]    . cefTRIAXone (ROCEPHIN)  IV 1 g (02/19/19 2306)    Current Outpatient Medications  Medication Instructions  . capsaicin (ZOSTRIX) 0.025 % cream Topical, 3 times daily PRN  . gabapentin (NEURONTIN) 300 mg, Oral, 2 times daily  . glimepiride (AMARYL) 2 mg, Oral,  Daily  . hydroxychloroquine (PLAQUENIL) 200 mg, Oral, Daily  . levothyroxine (SYNTHROID) 75 mcg, Oral, Daily before breakfast  . losartan-hydrochlorothiazide (HYZAAR) 50-12.5 MG tablet 1 tablet, Oral, Daily  . omeprazole (PRILOSEC) 20 mg, Oral, Daily  . [START ON 02/21/2019] Rivaroxaban (XARELTO) 15 mg, Oral, Daily with supper  . rosuvastatin (CRESTOR) 20 mg, Oral, Daily  . Tiotropium Bromide Monohydrate (SPIRIVA RESPIMAT) 1.25 MCG/ACT AERS 2 puffs, Inhalation, Daily  . Vitamin D (Ergocalciferol) (DRISDOL) 50,000 Units, Oral, Every Sun   I/O last 3 completed shifts: In: 340 [P.O.:240; IV Piggyback:100] Out: 600 [Stool:600] No intake/output data recorded.   Radiology:   Imaging: HR CT Chest  02/11/2018: Cardiovascular: Atherosclerotic calcification of the aorta and coronary arteries. Heart size normal. No pericardial effusion. Pulmonary arteries are enlarged. Lungs/Pleura: Peripheral and basilar predominant subpleural reticulation, ground-glass, traction bronchiectasis/bronchiolectasis and probable honeycombing grossly stable prior exams. 3 mm posterior right upper lobe nodule (series 3, image 47), unchanged and considered benign. No worrisome pulmonary nodules. Mild air trapping. No pleural fluid. Airway is unremarkable.  CT head without contrast and CT cervical spine without contrast 02/18/2019: 1. No acute intracranial pathology. 2. No acute/traumatic cervical spine pathology. Multilevel degenerative changes most prominent at C4-C5.  Cardiac Studies:   Lexiscan myoview stress test 07/03/2016: 1. The resting electrocardiogram demonstrated normal sinus rhythm, normal resting conduction, PVC and normal rest repolarization. Stress EKG is non-diagnostic for ischemia as it a pharmacologic stress using Lexiscan. Stress symptoms included dyspnea. 2. Myocardial perfusion imaging is normal. Overall left ventricular systolic function was normal without regional wall motion abnormalities. The  left ventricular ejection fraction was 75%.  Echocardiogram 02/18/2019:   Left ventricular ejection fraction, by visual estimation, is 60 to 65%. The left ventricle has normal function. There is moderately increased left ventricular hypertrophy. Elevated left ventricular end-diastolic pressure. Left ventricular diastolic parameters are consistent with Grade I diastolic dysfunction (impaired relaxation). Mitral valve anterior mitral leaflet tip vegetation 1 x 0.7 cm. 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 The mitral valve is abnormal. Trivial mitral valve regurgitation. No evidence of mitral stenosis. The tricuspid valve is normal in structure. Tricuspid valve regurgitation is mild-moderate. TR Peak grad:   20.9 mmHg. No pulmonary hypertension.  Carotid artery duplex  02/18/2019: Right Carotid: Velocities in the right ICA are consistent with a 1-39% stenosis. Left Carotid: Velocities in the left ICA are consistent with a 1-39% stenosis. Vertebrals:  Bilateral vertebral arteries demonstrate antegrade flow. Subclavians: Normal flow hemodynamics were seen in bilateral subclavian arteries.  Assessment   1. 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 2. Syncope due to UTI and vasovagal episode. 3. Essential hypertension 4. 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 H/O SLE and breast cancer  6. ILD 7. Coronary calcification noted on CT chest 07/22/2016. 8.  Shortness of breath.  Abnormal CT chest revealing dilated pulmonary artery, may indicate pulmonary hypertension. 9.  History of breast cancer  Recommendations:   Folasade Marentette  is a 79 y.o. African-American female with history of diabetes mellitus without complication, hypertension, SLE, history of remote breast cancer status post right breast lumpectomy sometime in 2015, admitted to the  hospital with near syncope.  The echocardiogram was reviewed in detail.  Clearly the mitral valve leaflets both anterior > posterior mitral leaflet is clearly involved in the absence of infectious etiology, high suspicion for Libman-Sacks endocarditis.  This type of lesions are usually found in patients with connective tissue disease but mostly malignancy.  Patient does have history of breast cancer, she needs to have further evaluation to exclude any underlying malignancy as well.  This type of endocarditis carries extreme high risk for embolic phenomena.  Would recommend anticoagulation, Xarelto 15 mg daily.  I will also order for hypercoagulable work-up.  She does have coronary calcification noted on CT scan, needs to be on a statin irrespective of lipid level and she has had a nuclear stress test in 2018 which was nonischemic but may consider repeating a stress test at some point in future.  I will see her back in the office for follow-up. Can be discharged from cardiac standpoint and I will f/u labs. Xarelto 15 mg qpm.  This was a 75 minute consultation with independent review of images, evaluation of complex presentation and management.    Adrian Prows, MD, [facility] 02/20/2019, 8:10 PM [facility] Pager: [phone] Office: [phone]

## 2019-02-21 LAB — PROTEIN S ACTIVITY: Protein S Activity: 45 % — ABNORMAL LOW (ref 63–140)

## 2019-02-21 LAB — HOMOCYSTEINE: Homocysteine: 14.6 umol/L (ref 0.0–19.2)

## 2019-02-21 LAB — PROTEIN S, TOTAL: Protein S Ag, Total: 77 % (ref 60–150)

## 2019-02-21 LAB — LUPUS ANTICOAGULANT PANEL
DRVVT: 24.6 s (ref 0.0–47.0)
PTT Lupus Anticoagulant: 40.9 s (ref 0.0–51.9)

## 2019-02-21 LAB — PROTEIN C ACTIVITY: Protein C Activity: >199 % — ABNORMAL HIGH (ref 73–180)

## 2019-02-21 LAB — PROTEIN C, TOTAL: Protein C, Total: 97 % (ref 60–150)

## 2019-02-22 LAB — BETA-2-GLYCOPROTEIN I ABS, IGG/M/A
Beta-2 Glyco I IgG: <9 GPI IgG units (ref 0–20)
Beta-2-Glycoprotein I IgA: 11 GPI IgA units (ref 0–25)
Beta-2-Glycoprotein I IgM: <9 GPI IgM units (ref 0–32)

## 2019-02-22 NOTE — Progress Notes (Signed)
No significant hypercoagulable state noted.

## 2019-02-23 LAB — CARDIOLIPIN ANTIBODIES, IGG, IGM, IGA
Anticardiolipin IgA: <9 APL U/mL (ref 0–11)
Anticardiolipin IgG: <9 GPL U/mL (ref 0–14)
Anticardiolipin IgM: <9 MPL U/mL (ref 0–12)

## 2019-02-23 LAB — FACTOR 5 LEIDEN

## 2019-02-24 LAB — PROTHROMBIN GENE MUTATION

## 2019-03-22 ENCOUNTER — Ambulatory Visit: Payer: Medicare Other | Admitting: Cardiology

## 2019-03-22 ENCOUNTER — Encounter: Payer: Self-pay | Admitting: Cardiology

## 2019-03-22 ENCOUNTER — Other Ambulatory Visit: Payer: Self-pay

## 2019-03-22 VITALS — BP 140/76 | HR 73 | Temp 98.4°F | Ht 67.0 in | Wt 162.0 lb

## 2019-03-22 DIAGNOSIS — Z87898 Personal history of other specified conditions: Secondary | ICD-10-CM

## 2019-03-22 DIAGNOSIS — I38 Endocarditis, valve unspecified: Secondary | ICD-10-CM

## 2019-03-22 DIAGNOSIS — J849 Interstitial pulmonary disease, unspecified: Secondary | ICD-10-CM

## 2019-03-22 DIAGNOSIS — I2510 Atherosclerotic heart disease of native coronary artery without angina pectoris: Secondary | ICD-10-CM

## 2019-03-22 DIAGNOSIS — M329 Systemic lupus erythematosus, unspecified: Secondary | ICD-10-CM

## 2019-03-22 DIAGNOSIS — E782 Mixed hyperlipidemia: Secondary | ICD-10-CM

## 2019-03-22 MED ORDER — ATORVASTATIN CALCIUM 20 MG PO TABS: 20.0000 mg | ORAL_TABLET | Freq: Every day | ORAL | 3 refills | Status: DC

## 2019-03-22 NOTE — Progress Notes (Signed)
Primary Physician/Referring:  Benito Mccreedy, MD  Patient ID: [name], female    DOB: 05-Jul-1940, 79 y.o.   MRN: [id number]  Chief Complaint  Patient presents with  . Loss of Consciousness    hospital follow up   HPI:    [name]  is a 79 y.o. African-American female with history of diabetes mellitus without complication, hypertension, SLE, history of remote breast cancer status post right breast lumpectomy sometime in 2015, admitted to the hospital with near syncope on 02/17/19, patient was found to be hyponatremic, also possibly UTI.  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 She has been started on Xarelto and now presents for hospital follow up.  She could not tolerate the Crestor due to worsening back pain, has stopped 3 days ago.   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 Has chronic shortness of breath that has progressively worsened over the last several months. Stable since hospitalization. No changes in her weight or leg edema. No PND or orthopnea.  Has coronary calcification by CT and a negative nuclear stress test in 2018. No hemoptysis, no chest pain or palpitations.  Past Medical History:  Diagnosis Date  . Breast cancer ([place])   . Diabetes mellitus without complication ([place])   . Hypertension   . Hypothyroidism   . Lupus [facility])    Past Surgical History:  Procedure Laterality Date  . BREAST LUMPECTOMY  2009   Social History   Tobacco Use  . Smoking status: Never Smoker  . Smokeless tobacco: Former Systems developer    Types: Snuff  Substance Use Topics  . Alcohol use: No    ROS  Review of Systems  Cardiovascular: Positive for dyspnea on exertion. Negative for chest pain, claudication, leg swelling, orthopnea, palpitations and syncope.  Neurological: Negative for dizziness, focal weakness and headaches.  All other systems reviewed and are negative.  Objective  Blood pressure 140/76, pulse 73, temperature 98.4  F (36.9 C), height 5\' 7"  (1.702 m), weight 162 lb (73.5 kg), SpO2 100 %.  Vitals with BMI 03/22/2019 03/22/2019 02/20/2019  Height - 5\' 7"  -  Weight - 162 lbs -  BMI - [id number] -  Systolic [id number]  Diastolic 76 85 75  Pulse 73 73 69    Physical Exam  Constitutional: [name], place, and time. Vital signs are normal. She appears well-developed and well-nourished.  Cardiovascular: Normal rate, regular rhythm, normal heart sounds and intact distal pulses.  Pulmonary/Chest: Effort normal and breath sounds normal. No accessory muscle usage. No respiratory distress.  Neurological: She is alert and oriented to person, place, and time.  Vitals reviewed.  Laboratory examination:   Recent Labs    02/17/19 2117 02/18/19 0233 02/20/19 0248  NA 121* 126* 131*  K 3.6 4.4 4.5  CL 88* 94* 97*  CO2 23 22 24   GLUCOSE 63* 50* 121*  BUN 13 13 13   CREATININE 1.34* 1.23* 1.13*  CALCIUM 9.1 9.2 9.4  GFRNONAA 38* 43* 47*  GFRAA 44* 49* 55*   CrCl cannot be calculated (Patient's most recent lab result is older than the maximum 21 days allowed.).  CMP Latest Ref Rng & Units 02/20/2019 02/18/2019 02/17/2019  Glucose 70 - 99 mg/dL 121(H) 50(L) 63(L)  BUN 8 - 23 mg/dL 13 13 13   Creatinine 0.44 - 1.00 mg/dL 1.13(H) 1.23(H) 1.34(H)  Sodium 135 - 145 mmol/L 131(L) 126(L) 121(L)  Potassium 3.5 - 5.1 mmol/L 4.5 4.4 3.6  Chloride 98 -  111 mmol/L 97(L) 94(L) 88(L)  CO2 22 - 32 mmol/L 24 22 23   Calcium 8.9 - 10.3 mg/dL 9.4 9.2 9.1  Total Protein 6.5 - 8.1 g/dL 7.8 8.0 -  Total Bilirubin 0.3 - 1.2 mg/dL 0.5 0.3 -  Alkaline Phos 38 - 126 U/L 50 57 -  AST 15 - 41 U/L 23 27 -  ALT 0 - 44 U/L 18 19 -   CBC Latest Ref Rng & Units 02/20/2019 02/18/2019 02/17/2019  WBC 4.0 - 10.5 K/uL 4.8 5.7 5.7  Hemoglobin 12.0 - 15.0 g/dL 11.3(L) 12.2 12.7  Hematocrit 36.0 - 46.0 % 33.3(L) 36.8 37.5  Platelets 150 - 400 K/uL 212 225 238   Lipid Panel     Component Value Date/Time   CHOL 152 02/20/2019  0814   TRIG 134 02/20/2019 0814   HDL 37 (L) 02/20/2019 0814   CHOLHDL 4.1 02/20/2019 0814   VLDL 27 02/20/2019 0814   LDLCALC 88 02/20/2019 0814   HEMOGLOBIN A1C Lab Results  Component Value Date   HGBA1C 6.6 (H) 01/03/2015   MPG 143 01/03/2015   TSH Recent Labs    02/18/19 0113  TSH 4.495    External labs: none  Medications and allergies   Allergies  Allergen Reactions  . Ciprofloxacin Itching, Swelling and Other (See Comments)    Possibly causing tremors?     Current Outpatient Medications  Medication Instructions  . atorvastatin (LIPITOR) 20 mg, Oral, Daily  . capsaicin (ZOSTRIX) 0.025 % cream Topical, 3 times daily PRN  . gabapentin (NEURONTIN) 300 mg, Oral, 2 times daily  . glimepiride (AMARYL) 2 mg, Oral, Daily  . hydroxychloroquine (PLAQUENIL) 200 mg, Oral, Daily  . levothyroxine (SYNTHROID) 75 mcg, Oral, Daily before breakfast  . losartan (COZAAR) 50 mg, Oral, Daily  . omeprazole (PRILOSEC) 20 mg, Oral, Daily  . Rivaroxaban (XARELTO) 15 mg, Oral, Daily with supper  . rosuvastatin (CRESTOR) 20 mg, Oral, Daily  . Tiotropium Bromide Monohydrate (SPIRIVA RESPIMAT) 1.25 MCG/ACT AERS 2 puffs, Inhalation, Daily  . Vitamin D (Ergocalciferol) (DRISDOL) 50,000 Units, Oral, Every Sun    Radiology:  HR CT Chest  02/11/2018: Cardiovascular: Atherosclerotic calcification of the aorta and coronary arteries. Heart size normal. No pericardial effusion. Pulmonary arteries are enlarged. Lungs/Pleura: Peripheral and basilar predominant subpleural reticulation, ground-glass, traction bronchiectasis/bronchiolectasis and probable honeycombing grossly stable prior exams. 3 mm posterior right upper lobe nodule (series 3, image 47), unchanged and considered benign. No worrisome pulmonary nodules. Mild air trapping. No pleural fluid. Airway is unremarkable.  CT head without contrast and CT cervical spine without contrast 02/18/2019: 1. No acute intracranial pathology. 2. No  acute/traumatic cervical spine pathology. Multilevel degenerative changes most prominent at C4-C5.  Cardiac Studies:   Lexiscan myoview stress test 07/03/2016: 1. The resting electrocardiogram demonstrated normal sinus rhythm, normal resting conduction, PVC and normal rest repolarization. Stress EKG is non-diagnostic for ischemia as it a pharmacologic stress using Lexiscan. Stress symptoms included dyspnea. 2. Myocardial perfusion imaging is normal. Overall left ventricular systolic function was normal without regional wall motion abnormalities. The left ventricular ejection fraction was 75%.  Echocardiogram 02/18/2019:   Left ventricular ejection fraction, by visual estimation, is 60 to 65%. The left ventricle has normal function. There is moderately increased left ventricular hypertrophy. Elevated left ventricular end-diastolic pressure. Left ventricular diastolic parameters are consistent with Grade I diastolic dysfunction (impaired relaxation). Mitral valve anterior mitral leaflet tip vegetation 1 x 0.7 cm. 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 The mitral valve is abnormal. Trivial mitral valve regurgitation. No evidence of mitral stenosis. The tricuspid valve is normal in structure. Tricuspid valve regurgitation is mild-moderate. TR Peak grad: 20.9 mmHg. No pulmonary hypertension.  Carotid artery duplex  02/18/2019: Right Carotid: Velocities in the right ICA are consistent with a 1-39% stenosis. Left Carotid: Velocities in the left ICA are consistent with a 1-39% stenosis. Vertebrals: Bilateral vertebral arteries demonstrate antegrade flow. Subclavians: Normal flow hemodynamics were seen in bilateral subclavian arteries.  Assessment     ICD-10-CM   1. [place] endocarditis  I38 PCV ECHOCARDIOGRAM COMPLETE  2. History of syncope  Z87.898   3. History of systemic lupus  erythematosus (SLE) ([facility])  M32.9   4. ILD (interstitial lung disease) ([place])  J84.9   5. Coronary artery calcification seen on CT scan  I25.10     EKG 02/17/2019: Normal sinus rhythm at 61 bpm, normal axis, PRWP cannot exclude anterior infarct old. Nonspecific T wave abnormality.  Meds ordered this encounter  Medications  . atorvastatin (LIPITOR) 20 MG tablet    Sig: Take 1 tablet (20 mg total) by mouth daily.    Dispense:  30 tablet    Refill:  3    Order Specific Question:   Supervising Provider    Answer:   Adrian Prows [[id number]]    Medications Discontinued During This Encounter  Medication Reason  . losartan-hydrochlorothiazide (HYZAAR) 50-12.5 MG tablet Error     Recommendations:   Keirra Tarry  is a 79 y.o. African-American female with history of diabetes mellitus without complication, hypertension, SLE, history of remote breast cancer status post right breast lumpectomy sometime in 2015, admitted to the hospital with near syncope on 02/17/19, patient was found to be hyponatremic, also possibly UTI.  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 She has been started on Xarelto and now presents for hospital follow up.  She is doing well since her hospitalization. No recurrence of syncope, likely related to vasovagal and in setting of UTI. Will continue to monitor.   No clinical evidence of heart failure. She has chronic dyspnea on exertion that has been slowly progressing over the last several months. Would recommend that she follow up with pulmonary given her history of ILD as likely etiology. No exertional chest pain. Does have coronary calcification on CT, negative nuclear stress test in 2018, would recommend continued aggressive medical management. Do not feel that she needs repeat stress testing at this point, unless she has significant worsening in her dyspnea or exertional chest pain. As she did not tolerate Crestor, will try Lipitor 20 mg  daily. She will need repeat lipid panel in 8 weeks, will ask that this be performed at her PCP office.  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 As these type of lesions are usually found in patients with connective tissue disease but mostly malignancy.  Patient does have history of breast cancer, she needs to have further evaluation to exclude any underlying malignancy as well. I will defer to PCP and I have encouraged patient to make an appointment to discuss. This type of endocarditis carries extreme high risk for embolic phenomena.  Would recommend anticoagulation, Xarelto 15 mg daily. Hypercoagulable workup was negative. No bleeding diathesis reported. Will repeat her echocardiogram in 6 months and see her back at that time. Encouraged her to contact me sooner if needed.  CC: Dr. Iona Beard Osei-Bonsu   Miquel Dunn, MSN,  APRN, FNP-C [facility]. [place] Office: [phone] Fax: [phone]

## 2019-03-23 ENCOUNTER — Encounter: Payer: Self-pay | Admitting: Cardiology

## 2019-03-30 DIAGNOSIS — J432 Centrilobular emphysema: Secondary | ICD-10-CM | POA: Diagnosis not present

## 2019-03-30 DIAGNOSIS — I38 Endocarditis, valve unspecified: Secondary | ICD-10-CM | POA: Diagnosis not present

## 2019-03-30 DIAGNOSIS — G629 Polyneuropathy, unspecified: Secondary | ICD-10-CM | POA: Diagnosis not present

## 2019-03-30 DIAGNOSIS — R300 Dysuria: Secondary | ICD-10-CM | POA: Diagnosis not present

## 2019-03-30 DIAGNOSIS — E1165 Type 2 diabetes mellitus with hyperglycemia: Secondary | ICD-10-CM | POA: Diagnosis not present

## 2019-03-30 DIAGNOSIS — E039 Hypothyroidism, unspecified: Secondary | ICD-10-CM | POA: Diagnosis not present

## 2019-03-30 DIAGNOSIS — I10 Essential (primary) hypertension: Secondary | ICD-10-CM | POA: Diagnosis not present

## 2019-03-30 DIAGNOSIS — N3000 Acute cystitis without hematuria: Secondary | ICD-10-CM | POA: Diagnosis not present

## 2019-03-30 DIAGNOSIS — I2584 Coronary atherosclerosis due to calcified coronary lesion: Secondary | ICD-10-CM | POA: Diagnosis not present

## 2019-04-07 DIAGNOSIS — Z20822 Contact with and (suspected) exposure to covid-19: Secondary | ICD-10-CM | POA: Diagnosis not present

## 2019-04-11 DIAGNOSIS — E039 Hypothyroidism, unspecified: Secondary | ICD-10-CM | POA: Diagnosis not present

## 2019-04-11 DIAGNOSIS — E1165 Type 2 diabetes mellitus with hyperglycemia: Secondary | ICD-10-CM | POA: Diagnosis not present

## 2019-04-11 DIAGNOSIS — I38 Endocarditis, valve unspecified: Secondary | ICD-10-CM | POA: Diagnosis not present

## 2019-04-11 DIAGNOSIS — I2584 Coronary atherosclerosis due to calcified coronary lesion: Secondary | ICD-10-CM | POA: Diagnosis not present

## 2019-04-11 DIAGNOSIS — I10 Essential (primary) hypertension: Secondary | ICD-10-CM | POA: Diagnosis not present

## 2019-04-11 DIAGNOSIS — J432 Centrilobular emphysema: Secondary | ICD-10-CM | POA: Diagnosis not present

## 2019-04-11 DIAGNOSIS — G629 Polyneuropathy, unspecified: Secondary | ICD-10-CM | POA: Diagnosis not present

## 2019-04-12 DIAGNOSIS — J849 Interstitial pulmonary disease, unspecified: Secondary | ICD-10-CM | POA: Diagnosis not present

## 2019-04-12 DIAGNOSIS — M150 Primary generalized (osteo)arthritis: Secondary | ICD-10-CM | POA: Diagnosis not present

## 2019-04-12 DIAGNOSIS — M2550 Pain in unspecified joint: Secondary | ICD-10-CM | POA: Diagnosis not present

## 2019-04-12 DIAGNOSIS — M3219 Other organ or system involvement in systemic lupus erythematosus: Secondary | ICD-10-CM | POA: Diagnosis not present

## 2019-04-12 DIAGNOSIS — Z79899 Other long term (current) drug therapy: Secondary | ICD-10-CM | POA: Diagnosis not present

## 2019-04-12 DIAGNOSIS — Z6824 Body mass index (BMI) 24.0-24.9, adult: Secondary | ICD-10-CM | POA: Diagnosis not present

## 2019-04-12 DIAGNOSIS — E663 Overweight: Secondary | ICD-10-CM | POA: Diagnosis not present

## 2019-05-08 ENCOUNTER — Ambulatory Visit: Payer: Medicare Other | Admitting: Internal Medicine

## 2019-06-14 ENCOUNTER — Ambulatory Visit (INDEPENDENT_AMBULATORY_CARE_PROVIDER_SITE_OTHER): Payer: Medicare HMO | Admitting: Internal Medicine

## 2019-06-14 ENCOUNTER — Encounter: Payer: Self-pay | Admitting: Internal Medicine

## 2019-06-14 ENCOUNTER — Other Ambulatory Visit: Payer: Self-pay

## 2019-06-14 VITALS — BP 116/70 | HR 62 | Temp 98.1°F | Ht 67.0 in | Wt 156.6 lb

## 2019-06-14 DIAGNOSIS — J439 Emphysema, unspecified: Secondary | ICD-10-CM

## 2019-06-14 DIAGNOSIS — I288 Other diseases of pulmonary vessels: Secondary | ICD-10-CM | POA: Diagnosis not present

## 2019-06-14 DIAGNOSIS — J849 Interstitial pulmonary disease, unspecified: Secondary | ICD-10-CM | POA: Diagnosis not present

## 2019-06-14 NOTE — Patient Instructions (Addendum)
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 compared to 1 year ago  Plan -Do high-resolution CT chest supine and prone -do this in the next few to several weeks -Do overnight pulse ox study on room air -Hold off on pulmonary function test because of technical difficulties with you doing it -Based on results of CT scan of the chest will discussed antifibrotic therapy and possible enrollment in ILD-pro registry  Pulmonary emphysema, unspecified emphysema type ([place])  -This appears stable without any flareups  Plan -- continue spiriva   Enlarged Pulmonary artery  - right heart cath depending on clinical course  Followup -30-minute slot in the next few weeks to several weeks but after high-resolution CT chest and overnight pulse ox study

## 2019-06-14 NOTE — Progress Notes (Signed)
HPI  PCP Kathryn Mccreedy, MD  HPI  IOV 07/16/2016  Chief Complaint  Patient presents with  . Shortness of Breath    consult for sob referring doctor is Dr. Einar [name] patient states that she is very sob and using the inhaler helps her very much     According to the referral chart from Dr. Einar [name], he'll originally saw her on 06/26/2016 for shortness of breath at follow-up. Patient reported to him that she's had insidious onset of shortness of breath for several years with progression in the last several months associated with bilateral chest tightness. Apparently patient was given a diagnosis of asthma and bronchodilator use does help her. Per the patient history spirometry testing outside was abnormal. Dyspnea is significant enough patient not able to do her household work. There is associated fatigue. A trial of diuresis by the primary care physician did help the shortness of breath. Patient's symptoms are associated with lisinopril intake for hypertension.  exhaled nitric oxide test today in the office is 19 ppb and normal.  IgE. ''Walking desaturation test on 07/16/2016 185 feet x 3 laps on room air forehead probe:  did NOT desaturate. Rest pulse ox was 100%, final pulse ox was 100%. HR response 75/min at rest to 85/min at peak exertion. 2-D echocardiogram 02/21/2016 as reviewed from the outside chart shows mild concentric left ventricular remodeling with ejection fraction 79. Normal right ventricle size and contractility. No evidence of diastolic dysfunction. No pericardial effusion    In talking to the patient and her daughter [name] the above information was confirmed. In addition she is on Advair discus for the last few months because of the above problems. It is helping her. She takes it as needed which ends up being every other day once. Today the exhaled nitric oxide was normal 16 ppb which is essentially on Advair. She is also on lisinopril for the last few years. She does have  associated mild cough. Evaluation by cardiology did show crackles in the concern is he has interstitial lung disease therefore she is referred here. Associated with concern of interstitial lung disease is a history of lupus that has been present for many years. When she relocated to [facility] few years ago with a primary care physician has been following this. Patient is on plaquenil but not steroids The family is unsure about SLE blood work  Results for [name], [name] (MRN [id number]) as of 07/16/2016 10:45  Ref. Range 01/05/2015 09:31 01/12/2015 16:07 01/12/2015 19:45 01/12/2015 20:00 01/12/2015 20:02  Hemoglobin Latest Ref Range: 12.0 - 15.0 g/dL 10.4 (L) 11.9 (L)        OV 07/30/2016  Chief Complaint  Patient presents with  . Follow-up    Pt was here for a PFT but was unable to complete it. Pt continues to be SOB with some improvement, occ. dry cough.   Here to follow-up for pulmonary test results for interstitial lung disease workup. In terms of test results her ESR is high ANA is positive and her Sjogren's antibodies positive but lupus antibodies and rheumatoid factor antibodies are negative. Her CT scan does show interstitial lung disease at the base. She could not complete pulmonary function test due to inability to follow direction but I suspect the disease is mild because of walking desaturation test last visit was mild. Her daughter and she appeared to understand these test results. She reports dyspnea and cough. She is on ACE inhibitor.    OV 02/02/2017  Chief Complaint  Patient presents  with  . Follow-up    Pt states she  has been doing good since last visit. Denies any complaints of cough, SOB, or CP.   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  In the interim she has no complaints.  She only has mild dyspnea on exertion.  Walking desaturation test on 185 feet x3 laps on room air.  Resting pulse ox  100%.  Final pulse ox 97%.  Resting heart rate was 70/min.  Final heart rate was 87/min.  She did see a rheumatologist Dr. Gavin [name] in practice last month.  I reviewed the note.  It appears that they are not finding any other systemic evidence of autoimmune disease.  Therefore she is still maintained on Plaquenil.  According to the notes patient is also Smith and RNP antibody positive.  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  Most recent CT scan of the chest in summer 2018 as a possible/probable UIP.  Results for Kathryn, [name] (MRN [id number]) as of 07/30/2016 10:00  Ref. Range 07/16/2016 12:09 07/16/2016 12:18  Nitric Oxide Unknown  16  Sed Rate Latest Ref Range: 0 - 30 mm/hr 77 (H)   Anit Nuclear Antibody(ANA) Latest Ref Range: NEGATIVE  POS (A)   ANA Pattern 1 Unknown NUCLEOLAR (A)   ANA Titer 1 Latest Units: titer 4:327 (H)   Cyclic Citrullin Peptide Ab Latest Units: Units <16   ds DNA Ab Latest Units: IU/mL 1   RA Latex Turbid. Latest Ref Range: <14 IU/mL <14   SSA (Ro) (ENA) Antibody, IgG Latest Ref Range: <1.0 NEG AI  >8.0 POS (H)   SSB (La) (ENA) Antibody, IgG Latest Ref Range: <1.0 NEG AI  <1.0 NEG   Scleroderma (Scl-70) (ENA) Antibody, IgG Latest Ref Range: <1.0 NEG AI  <1.0 NEG     IMPRESSION: CT CHEST HIGH RESOLUTION 1. 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 Differential includes fibrotic nonspecific interstitial pneumonia (NSIP). Follow-up high-resolution chest CT in 12 months would be useful to assess temporal pattern stability, as clinically warranted. 2. Solitary 3 mm solid right upper lobe pulmonary nodule. No follow-up needed if patient is low-risk. Non-contrast chest CT can be considered in 12 months if patient is high-risk. This recommendation follows the consensus statement: Guidelines for Management  of Incidental Pulmonary Nodules Detected on CT Images: From the Fleischner Society 2017; Radiology 2017; 284:228-243. 3. Left main and 3 vessel coronary atherosclerosis. 4. Small hiatal hernia. 5. Diffuse hepatic steatosis.  Aortic Atherosclerosis (ICD10-I70.0) and Emphysema (ICD10-J43.9).   Electronically Signed   By: [name] M.D.   On: 07/22/2016 14:17  OV 05/18/2017  Chief Complaint  Patient presents with  . Follow-up    PFT attempted today but pt was unable to complete as per Ria Comment pt did not want to do the PFT.  HRCT done 1/21.  Pt states she has been doing well. Breathing is about the same as last visit. Denies any complaints or concerns.    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  In the interim overall she is stable.  She is an extremely poor historian.  After asking her multiple times in different ways she believes earlier this year she had a cardiac stress test for a coronary artery calcification by Dr. Einar [name] and this was normal.  She is completely unaware of the fact that she has interstitial lung disease.  She believes she has asthma but when I questioned her and directed her that I have explained to her that she has pulmonary fibrosis she then admitted to the in fact she understands that she has pulmonary fibrosis.  Overall she is stable she has mild dyspnea on exertion without any cough.  She thinks she had lupus diagnosed by a rheumatologist in [facility] but when I told her that there is no systemic evidence of autoimmune disease when she saw [facility] rheumatology she seems surprised that she even made that visit.  There are no new issues.   Walking desaturation test on 05/18/2017 185 feet x 3 laps on ROOM AIR:  did not have symptioms or  desaturate. Rest pulse ox was 100%, final pulse ox was 95%. HR response 69/min at rest to 90/min at peak exertion. Patient [name]  Did not  Desaturate < 88% . [name] did  Desaturated </= 3% points. [name] did get tachyardic   IMPRESSION: Lungs/Pleura: No pneumothorax. No pleural effusion. Posterior right upper lobe 3 mm solid pulmonary nodule (series 5/image 39), stable since 07/22/2016 chest CT. No acute consolidative airspace disease, lung masses or new significant pulmonary nodules. No significant air trapping on the expiration sequence. There is patchy confluent subpleural reticulation and ground-glass attenuation in both lungs with associated mild traction bronchiectasis and minimal architectural distortion. There is mild honeycombing throughout both lung bases. Findings have not convincingly progressed since 07/22/2016 chest CT. 1. No appreciable interval progression of basilar predominant fibrotic interstitial lung disease with mild honeycombing in the intervening 6 months. Findings are still considered to represent probable usual interstitial pneumonia (UIP), with the differential including fibrotic phase nonspecific interstitial pneumonia (NSIP). Follow-up high-resolution chest CT recommended in 12 months to assess ongoing temporal pattern stability. 2. Tiny right upper lobe pulmonary nodule is stable for 6 months and probably benign. 3. Left main and 3 vessel coronary atherosclerosis. 4. Small hiatal hernia.  Aortic Atherosclerosis (ICD10-I70.0).   Electronically Signed   By: [name] M.D.   On: 02/15/2017 16:05   OV 08/18/2017  Chief Complaint  Patient presents with  . Follow-up    PFT attempted today but pt was unable to complete.  Pt states she is about the same since last visit. Denies any real complaints of SOB and denies any real complaints.    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 Immunomodulators of probably due to  toxic follow-up. Moreover her ILD appears stable over time. Therefore we decided to treat her with  Spiriva. She is here for follow-up she tells me she is feeling better. She likes her Spiriva. This is actually helping her shortness of breath. Walking desaturation test is essentially normal. She had a pulmonary function test today but she could not follow through with instructions. There are no other new issues. She denies any ER visits   OV 02/15/2018  Subjective:  Patient ID: Rondia Higginbotham, female , DOB: [birth year] , age 54 y.o. , MRN: [id number] , ADDRESS: [place] [id number]   02/15/2018 -   Chief Complaint  Patient presents with  . Follow-up    Pt states she has been doing well since last visit. States she does become SOB when she exerts herself and states she does  have an occ. dry cough. Denies any complaints of CP/chest tightness.    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 -last seen April 2019.  Is a routine follow-up for the above issues.  She expresses that she has dry eyes and dry mouth.  She again does not recollect seeing a rheumatologist.  She is on supportive care but she is on Plaquenil.  Therefore she is probably seeing a rheumatologist.  She is unable to do pulmonary function test therefore she did a walk test with [place] and it is stable and documented below she also had high-resolution CT chest that I visualized as documented below.  She has UIP pattern but it is stable and a year and a half now.  She has no worsening dyspnea.  No interim complaints.  She does have coronary artery calcification on the CT scan but she says she saw Dr. Einar [name] approximately a year ago and a stress  test was normal.  She continues with Spiriva for the associated emphysema from remote smoking.  This is helping her.  We discussed about taking anti-fibrotic's which would be indicated if this was IPF but because she has autoimmune features it would be indicated only if she got worse.  In either event she seems somewhat nervous about taking a drug that has GI side effect potential.  She wants to wait and watch.     OV 06/14/2019  Subjective:  Patient ID: Laticia Vannostrand, female , DOB: [birth year] , age 79 y.o. , MRN: [id number] , ADDRESS: [place] [id number] PCP Kathryn Mccreedy, MD Cards- Dr [name] Rheum - Dr [name]  06/14/2019 -   Chief Complaint  Patient presents with  . Follow-up    Pt states she has been doing good since last visit and denies any complaints.   Follow-up IPAF (UIP with autoimmune serology; June 2018 ANA 1:320, SSA > 8, dry eyes/mount + eSR 71)     - on supportive care due to goals of care (could not do PFT technically 2019) -   Follow-up mild associated emphysema on CT scan   - on spiriva  HPI Shaterra Kimmey 79 y.o. -presents for follow-up.  Not seen her in over a year because of the COVID-19 pandemic.  She tells me that steadily and slowly in the last 1 year her shortness of breath with exertion has progressed.  Her effort tolerance is lower.  She states that walking in our office she felt more dyspneic than in the past.  In fact walking in our office her pulse ox dropped 8 points.  This is a change for her.  She does not want to do pulmonary function test because of technical challenges with her doing PFTs.  She prefers other modes of monitoring.  This would include CT scan of the chest.  At last visit she did not want entertain the idea of antifibrotic therapy.  She preferred supportive care but now that she is declining she is more open to the idea.  Her symptom scores are listed below.  In the interim she did get admitted to the hospital in  January 2021 with hyponatremia.  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   SYMPTOM SCALE - ILD 06/14/2019   O2 use ra  Shortness of Breath 0 -> 5 scale with 5 being worst (score 6 If unable to do)  At rest 0  Simple tasks - showers, clothes change, eating, shaving 1  Household (dishes, doing bed, laundry) 3  Shopping 5  Walking level at own pace 3  Walking up Stairs 5  Total (30-36) Dyspnea Score 17  How bad is your cough? 2  How bad is your fatigue 3  How bad is nausea 00  How bad is vomiting?  0  How bad is diarrhea? 0  How bad is anxiety? 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  There is mention of intact RV function but no mention of pulmonary artery pressures this is based on review of the chart.  Labs in January 2021 shows a creatinine 1.13 mg percent with a GFR of 47 and a hemoglobin of 11.3 g%.. She was evaluated by Dr. Einar [name].  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   Simple office walk 185 feet x  3 laps goal with forehead probe 08/18/2017    02/15/2018  06/14/2019   O2 used *room air Room air ra  Number laps completed '3 3 3  '$ Comments about pace Normal pace Slow pace Slow pace  Resting Pulse Ox/HR 100% and 67/min 100% and 73/min 100% and 63/mi  Final Pulse Ox/HR 100% and 91/min 100% and 96/min 92% and 84/mi  Desaturated </= 88% no No  no  Desaturated <= 3% points no no Yues, 8 ponts  Got Tachycardic >/= 90/min [name] [name] no  Symptoms at end of test Mild dyspnea Mild dyspnea Mild dyspnea  Miscellaneous comments x x      HRCT Jan 2020 IMPRESSION: 1. Pulmonary parenchymal pattern of fibrosis, as described above, stable from 07/22/2016. Findings are consistent with UIP per consensus guidelines: Diagnosis of Idiopathic Pulmonary Fibrosis: An Official ATS/ERS/JRS/ALAT  Clinical Practice Guideline. Kelly, Iss 5, [phone], Sep 26 2016. 2. Liver appears steatotic. 3. Aortic atherosclerosis (ICD10-170.0). Coronary artery calcification. 4. Enlarged pulmonary arteries, indicative of pulmonary arterial hypertension.   Electronically Signed   By: Lorin Picket M.D.   On: 02/11/2018 12:04   ROS - per HPI  Results for MARIYANNA, MUCHA (MRN [id number]) as of 06/14/2019 10:26  Ref. Range 05/18/2017 12:44  FVC-Pre Latest Units: L 1.61  FVC-%Pred-Pre Latest Units: % 63     has a past medical history of Breast cancer ([place]), Diabetes mellitus without complication ([place]), Hypertension, Hypothyroidism, and Lupus ([place]).   reports that she has never smoked. She quit smokeless tobacco use about 4 years ago.  Her smokeless tobacco use included snuff.  Past Surgical History:  Procedure Laterality Date  . BREAST LUMPECTOMY  2009    Allergies  Allergen Reactions  . Ciprofloxacin Itching, Swelling and Other (See Comments)    Possibly causing tremors?    Immunization History  Administered Date(s) Administered  . Influenza, High Dose Seasonal PF 11/09/2016  . Influenza,inj,Quad PF,6+ Mos 10/27/2015  . PFIZER SARS-COV-2 Vaccination 04/22/2019, 05/13/2019  . Pneumococcal Conjugate-13 02/15/2018    Family History  Problem Relation Age of Onset  . Cancer Father        ?  type     Current Outpatient Medications:  .  atorvastatin (LIPITOR) 20 MG tablet, Take 1 tablet (20 mg total) by mouth daily., Disp: 30 tablet, Rfl: 3 .  capsaicin (ZOSTRIX) 0.025 % cream, Apply topically 3 (three) times daily as needed (for pain). , Disp: , Rfl:  .  gabapentin (NEURONTIN) 300 MG capsule, Take 300 mg by mouth 2 (two) times daily., Disp: , Rfl:  .  glimepiride (AMARYL) 2 MG tablet, Take 2 mg by mouth daily., Disp: , Rfl:  .  hydroxychloroquine (PLAQUENIL) 200 MG tablet, Take 1 tablet (200 mg total) by mouth daily., Disp: 30 tablet, Rfl: 0 .   levothyroxine (SYNTHROID, LEVOTHROID) 75 MCG tablet, Take 75 mcg by mouth daily before breakfast. , Disp: , Rfl:  .  losartan (COZAAR) 50 MG tablet, Take 50 mg by mouth daily., Disp: , Rfl:  .  omeprazole (PRILOSEC) 20 MG capsule, Take 20 mg by mouth daily., Disp: , Rfl:  .  Rivaroxaban (XARELTO) 15 MG TABS tablet, Take 1 tablet (15 mg total) by mouth daily with supper., Disp: 60 tablet, Rfl: 0 .  Tiotropium Bromide Monohydrate (SPIRIVA RESPIMAT) 1.25 MCG/ACT AERS, Inhale 2 puffs into the lungs daily., Disp: 1 Inhaler, Rfl: 5 .  Vitamin D, Ergocalciferol, (DRISDOL) 1.25 MG (50000 UNIT) CAPS capsule, Take 50,000 Units by mouth every Sunday. , Disp: , Rfl:       Objective:   Vitals:   06/14/19 1025  BP: 116/70  Pulse: 62  Temp: 98.1 F (36.7 C)  TempSrc: Temporal  SpO2: 100%  Weight: 156 lb 9.6 oz (71 kg)  Height: '5\' 7"'$  (1.702 m)    Estimated body mass index is 24.53 kg/m as calculated from the following:   Height as of this encounter: '5\' 7"'$  (1.702 m).   Weight as of this encounter: 156 lb 9.6 oz (71 kg).  '@WEIGHTCHANGE'$ @  [employer]   06/14/19 1025  Weight: 156 lb 9.6 oz (71 kg)     Physical Exam  General Appearance:    Alert, cooperative, no distress, appears stated age - [name] , Deconditioned looking - [name] , OBESE  - no, Sitting on Wheelchair -  no  Head:    Normocephalic, without obvious abnormality, atraumatic  Eyes:    PERRL, conjunctiva/corneas clear,  Ears:    Normal TM's and external ear canals, both ears  Nose:   Nares normal, septum midline, mucosa normal, no drainage    or sinus tenderness. OXYGEN ON  - no . Patient is @ no   Throat:   Lips, mucosa, and tongue normal; teeth and gums normal. Cyanosis on lips - no  Neck:   Supple, symmetrical, trachea midline, no adenopathy;    thyroid:  no enlargement/tenderness/nodules; no carotid   bruit or JVD  Back:     Symmetric, no curvature, ROM normal, no CVA tenderness  Lungs:     Distress - no , Wheeze no, Barrell  Chest - no, Purse lip breathing - no, Crackles -classic Velcro crackles with a craniocaudal gradient.  Predominant at the base  Chest Wall:    No tenderness or deformity.    Heart:    Regular rate and rhythm, S1 and S2 normal, no rub   or gallop, Murmur - no  Breast Exam:    NOT DONE  Abdomen:     Soft, non-tender, bowel sounds active all four quadrants,    no masses, no organomegaly. Visceral obesity - no  Genitalia:   NOT DONE  Rectal:  NOT DONE  Extremities:   Extremities - normal, Has Cane - no, Clubbing - no, Edema - no  Pulses:   2+ and symmetric all extremities  Skin:   Stigmata of Connective Tissue Disease -appears to have tight skin  Lymph nodes:   Cervical, supraclavicular, and axillary nodes normal  Psychiatric:  Neurologic:   Pleasant - [name], Anxious - no, Flat affect - [name]  CAm-ICU - neg, Alert and Oriented x 3 - [name], Moves all 4s - [name], Speech - normal, Cognition - intact           Assessment:       ICD-10-CM   1. ILD (interstitial lung disease) (HCC)  J84.9 Pulse oximetry, overnight  2. Pulmonary emphysema, unspecified emphysema type ([place])  J43.9   3. Enlarged pulmonary artery (HCC)  I28.8   4. Interstitial pulmonary disease (HCC)  J84.9 CT Chest High Resolution    CANCELED: CT Chest High Resolution   Am concerned about progressive ILD.  In addition depending on the CT scan of results she might actually need a right heart catheterization independent of the echo report that did not mention any evidence of increased pulmonary artery pressures.  This is because of connective tissue disease and ILD and progressive shortness of breath.    Plan:     Patient Instructions  ILD (interstitial lung disease) ([place]) Interstitial lung disease due to connective tissue disease ([place])  -I am concerned your pulmonary fibrosis is worse compared to 1 year ago  Plan -Do high-resolution CT chest supine and prone -do this in the next few to several weeks -Do overnight pulse ox study  on room air -Hold off on pulmonary function test because of technical difficulties with you doing it -Based on results of CT scan of the chest will discussed antifibrotic therapy and possible enrollment in ILD-pro registry  Pulmonary emphysema, unspecified emphysema type ([place])  -This appears stable without any flareups  Plan -- continue spiriva   Enlarged Pulmonary artery  - right heart cath depending on clinical course  Followup -30-minute slot in the next few weeks to several weeks but after high-resolution CT chest and overnight pulse ox study     SIGNATURE    Dr. Brand Males, M.D., F.C.C.P,  Pulmonary and Critical Care Medicine Staff Physician, [facility] Director - Interstitial Lung Disease  Program  Pulmonary [place] at [place], [place], [address]  Pager: [phone], If no answer or between  15:00h - 7:00h: call [phone] Telephone: [phone]  10:53 AM 06/14/2019

## 2019-06-21 ENCOUNTER — Telehealth: Payer: Self-pay | Admitting: Internal Medicine

## 2019-06-21 ENCOUNTER — Ambulatory Visit
Admission: EM | Admit: 2019-06-21 | Discharge: 2019-06-21 | Disposition: A | Discharge status: Home / Self Care | Payer: Medicare HMO | Attending: Emergency Medicine | Admitting: Emergency Medicine

## 2019-06-21 DIAGNOSIS — N3941 Urge incontinence: Secondary | ICD-10-CM | POA: Diagnosis not present

## 2019-06-21 DIAGNOSIS — M545 Low back pain, unspecified: Secondary | ICD-10-CM

## 2019-06-21 DIAGNOSIS — R300 Dysuria: Secondary | ICD-10-CM

## 2019-06-21 LAB — POCT URINALYSIS DIP (MANUAL ENTRY)
Bilirubin, UA: NEGATIVE
Blood, UA: NEGATIVE
Glucose, UA: NEGATIVE mg/dL
Ketones, POC UA: NEGATIVE mg/dL
Leukocytes, UA: NEGATIVE
Nitrite, UA: NEGATIVE
Protein Ur, POC: NEGATIVE mg/dL
Spec Grav, UA: 1.02 (ref 1.010–1.025)
Urobilinogen, UA: 0.2 E.U./dL
pH, UA: 7 (ref 5.0–8.0)

## 2019-06-21 MED ORDER — SPIRIVA RESPIMAT 1.25 MCG/ACT IN AERS: 2.0000 | INHALATION_SPRAY | Freq: Every day | RESPIRATORY_TRACT | 3 refills | Status: DC

## 2019-06-21 MED ORDER — KETOROLAC TROMETHAMINE 30 MG/ML IJ SOLN
15.0000 mg | Freq: Once | INTRAMUSCULAR | Status: AC
  Administered 2019-06-21: 15 mg via INTRAMUSCULAR

## 2019-06-21 NOTE — ED Provider Notes (Signed)
[facility]    CSN: [id number] Arrival date & time: 06/21/19  1042      History   Chief Complaint Chief Complaint  Patient presents with  . Urinary Tract Infection    HPI Shekita Googe is a 79 y.o. female with history of diabetes, hypothyroidism, hypertension, lupus presenting for 2-week course of right-sided back/flank pain, intermittent dysuria as well as mild urinary incontinence.  Patient denying pelvic or abdominal pain, nausea, vomiting, fever.  States she feels well otherwise.  Has not take anything for symptoms.  Denies injury or inciting event.   Past Medical History:  Diagnosis Date  . Breast cancer ([place])   . Diabetes mellitus without complication ([place])   . Hypertension   . Hypothyroidism   . Lupus [facility])     Patient Active Problem List   Diagnosis Date Noted  . Acute cystitis 02/20/2019  . Near syncope 02/18/2019  . Hyponatremia 02/18/2019  . ILD (interstitial lung disease) ([place]) 07/30/2016  . Dyspnea 07/16/2016  . Bibasilar crackles 07/16/2016  . History of systemic lupus erythematosus (SLE) ([place]) 07/16/2016  . Cough 07/16/2016  . Encounter for monitoring ACE-inhibitor therapy 07/16/2016  . History of asthma 07/16/2016  . Fever, unspecified   . Hypothyroidism 01/03/2015  . HTN (hypertension) 01/03/2015  . Drug-induced hypersensitivity reaction 01/03/2015  . Lupus ([place]) 01/03/2015  . Pyrexia 01/02/2015    Past Surgical History:  Procedure Laterality Date  . BREAST LUMPECTOMY  2009    OB History   No obstetric history on file.      Home Medications    Prior to Admission medications   Medication Sig Start Date End Date Taking? Authorizing Provider  atorvastatin (LIPITOR) 20 MG tablet Take 1 tablet (20 mg total) by mouth daily. 03/22/19 06/20/19  Miquel Dunn, NP  capsaicin (ZOSTRIX) 0.025 % cream Apply topically 3 (three) times daily as needed (for pain).  09/16/18   [provider]  gabapentin (NEURONTIN) 300 MG  capsule Take 300 mg by mouth 2 (two) times daily.    [provider]  glimepiride (AMARYL) 2 MG tablet Take 2 mg by mouth daily. 02/09/19   [provider]  hydroxychloroquine (PLAQUENIL) 200 MG tablet Take 1 tablet (200 mg total) by mouth daily. 01/06/15   Kelvin Cellar, MD  levothyroxine (SYNTHROID, LEVOTHROID) 75 MCG tablet Take 75 mcg by mouth daily before breakfast.  02/13/18   [provider]  losartan (COZAAR) 50 MG tablet Take 50 mg by mouth daily.    [provider]  omeprazole (PRILOSEC) 20 MG capsule Take 20 mg by mouth daily.    [provider]  Rivaroxaban (XARELTO) 15 MG TABS tablet Take 1 tablet (15 mg total) by mouth daily with supper. 02/21/19   Shelda Pal, DO  Tiotropium Bromide Monohydrate (SPIRIVA RESPIMAT) 1.25 MCG/ACT AERS Inhale 2 puffs into the lungs daily. 08/18/17   Brand Males, MD  Vitamin D, Ergocalciferol, (DRISDOL) 1.25 MG (50000 UNIT) CAPS capsule Take 50,000 Units by mouth every Sunday.  02/09/19   [provider]    Family History Family History  Problem Relation Age of Onset  . Cancer Father        ? type    Social History Social History   Tobacco Use  . Smoking status: Never Smoker  . Smokeless tobacco: Former Systems developer    Types: Snuff  Substance Use Topics  . Alcohol use: No  . Drug use: No     Allergies   Ciprofloxacin  Review of Systems As per HPI   Physical Exam Triage Vital Signs ED Triage Vitals  Enc Vitals Group     BP      Pulse      Resp      Temp      Temp src      SpO2      Weight      Height      Head Circumference      Peak Flow      Pain Score      Pain Loc      Pain Edu?      Excl. in [place]?    No data found.  Updated Vital Signs BP (!) 108/50 (BP Location: Left Arm)   Pulse 74   Temp 98 F (36.7 C) (Oral)   Resp 18   SpO2 96%   Visual Acuity Right Eye Distance:   Left Eye Distance:   Bilateral Distance:    Right Eye Near:   Left  Eye Near:    Bilateral Near:     Physical Exam Constitutional:      General: She is not in acute distress. HENT:     Head: Normocephalic and atraumatic.  Eyes:     General: No scleral icterus.    Pupils: Pupils are equal, round, and reactive to light.  Cardiovascular:     Rate and Rhythm: Normal rate and regular rhythm.  Pulmonary:     Effort: Pulmonary effort is normal. No respiratory distress.     Breath sounds: No wheezing.  Abdominal:     General: Bowel sounds are normal. There is no distension.     Palpations: Abdomen is soft.     Tenderness: There is no abdominal tenderness. There is no right CVA tenderness, left CVA tenderness or guarding.  Skin:    Capillary Refill: Capillary refill takes less than 2 seconds.     Coloration: Skin is not jaundiced or pale.  Neurological:     Mental Status: She is alert and oriented to person, place, and time.      UC Treatments / Results  Labs (all labs ordered are listed, but only abnormal results are displayed) Labs Reviewed  POCT URINALYSIS DIP (MANUAL ENTRY) - Normal    EKG   Radiology No results found.  Procedures Procedures (including critical care time)  Medications Ordered in UC Medications  ketorolac (TORADOL) 30 MG/ML injection 15 mg (has no administration in time range)    Initial Impression / Assessment and Plan / UC Course  I have reviewed the triage vital signs and the nursing notes.  Pertinent labs & imaging results that were available during my care of the patient were reviewed by me and considered in my medical decision making (see chart for details).     Patient febrile, nontoxic in office today.  Urine dipstick unremarkable.  Patient denying systemic symptoms of poor glucose control such as polyuria, polydipsia, polyphagia.  Patient given Toradol shot in office which he tolerated well.  Will monitor symptoms closely, and follow-up with PCP in 1 week.  Return precautions discussed, patient verbalized  understanding and is agreeable to plan. Final Clinical Impressions(s) / UC Diagnoses   Final diagnoses:  Acute right-sided low back pain without sciatica  Dysuria  Urge incontinence of urine     Discharge Instructions     Urine is not infected. Today you are given a shot to help out with pain and inflammation. Important to follow-up with your primary care  doctor in 1 week for further evaluation if needed. Go to the ER for worsening pain, blood in urine, fever, severe abdominal pain.    ED Prescriptions    None     PDMP not reviewed this encounter.   Hall-Potvin, [place], [place] 06/21/19 1224

## 2019-06-21 NOTE — ED Triage Notes (Signed)
Pt c/o rt flank pain, burning on urination, and dribbles at times for over the past 2wks

## 2019-06-21 NOTE — Telephone Encounter (Signed)
Spoke with patient regarding refill request for Spiriva with 3 refills. Patient is aware of refill . Patient's voice understanding. Nothing else needed.

## 2019-06-21 NOTE — ED Notes (Signed)
Bed: EUC01 Expected date:  Expected time:  Means of arrival:  Comments:

## 2019-06-21 NOTE — Discharge Instructions (Signed)
Urine is not infected. Today you are given a shot to help out with pain and inflammation. Important to follow-up with your primary care doctor in 1 week for further evaluation if needed. Go to the ER for worsening pain, blood in urine, fever, severe abdominal pain.

## 2019-06-22 ENCOUNTER — Encounter: Payer: Self-pay | Admitting: Internal Medicine

## 2019-06-23 DIAGNOSIS — J849 Interstitial pulmonary disease, unspecified: Secondary | ICD-10-CM | POA: Diagnosis not present

## 2019-06-29 ENCOUNTER — Telehealth: Payer: Self-pay | Admitting: Internal Medicine

## 2019-06-29 DIAGNOSIS — Z0001 Encounter for general adult medical examination with abnormal findings: Secondary | ICD-10-CM | POA: Diagnosis not present

## 2019-06-29 DIAGNOSIS — I38 Endocarditis, valve unspecified: Secondary | ICD-10-CM | POA: Diagnosis not present

## 2019-06-29 DIAGNOSIS — G629 Polyneuropathy, unspecified: Secondary | ICD-10-CM | POA: Diagnosis not present

## 2019-06-29 DIAGNOSIS — E039 Hypothyroidism, unspecified: Secondary | ICD-10-CM | POA: Diagnosis not present

## 2019-06-29 DIAGNOSIS — E1165 Type 2 diabetes mellitus with hyperglycemia: Secondary | ICD-10-CM | POA: Diagnosis not present

## 2019-06-29 DIAGNOSIS — I2584 Coronary atherosclerosis due to calcified coronary lesion: Secondary | ICD-10-CM | POA: Diagnosis not present

## 2019-06-29 DIAGNOSIS — N1831 Chronic kidney disease, stage 3a: Secondary | ICD-10-CM | POA: Diagnosis not present

## 2019-06-29 DIAGNOSIS — I10 Essential (primary) hypertension: Secondary | ICD-10-CM | POA: Diagnosis not present

## 2019-06-29 DIAGNOSIS — J432 Centrilobular emphysema: Secondary | ICD-10-CM | POA: Diagnosis not present

## 2019-06-29 NOTE — Telephone Encounter (Signed)
Overnight pulse oximetry done on room air on Jun 22, 2019.  Time spent less than 88% was 0 minutes lowest pulse ox was 90%  Plan -Does not need overnight oxygen

## 2019-07-03 NOTE — Telephone Encounter (Signed)
Called and spoke with pt letting her know the results of the ono and she verbalized understanding. Nothing further needed.

## 2019-07-11 ENCOUNTER — Other Ambulatory Visit: Payer: Self-pay

## 2019-07-11 MED ORDER — ATORVASTATIN CALCIUM 20 MG PO TABS: 20.0000 mg | ORAL_TABLET | Freq: Every day | ORAL | 3 refills | Status: DC

## 2019-07-25 ENCOUNTER — Ambulatory Visit
Admission: EM | Admit: 2019-07-25 | Discharge: 2019-07-25 | Disposition: A | Discharge status: Home / Self Care | Payer: Medicare HMO | Attending: Emergency Medicine | Admitting: Emergency Medicine

## 2019-07-25 ENCOUNTER — Other Ambulatory Visit: Payer: Self-pay

## 2019-07-25 DIAGNOSIS — J069 Acute upper respiratory infection, unspecified: Secondary | ICD-10-CM | POA: Diagnosis not present

## 2019-07-25 DIAGNOSIS — R059 Cough, unspecified: Secondary | ICD-10-CM

## 2019-07-25 DIAGNOSIS — R05 Cough: Secondary | ICD-10-CM | POA: Diagnosis not present

## 2019-07-25 MED ORDER — FLUTICASONE PROPIONATE 50 MCG/ACT NA SUSP: 1.0000 | Freq: Every day | NASAL | 0 refills | Status: DC

## 2019-07-25 MED ORDER — BENZONATATE 100 MG PO CAPS: 100.0000 mg | ORAL_CAPSULE | Freq: Three times a day (TID) | ORAL | 0 refills | Status: DC

## 2019-07-25 MED ORDER — ALBUTEROL SULFATE HFA 108 (90 BASE) MCG/ACT IN AERS: 2.0000 | INHALATION_SPRAY | RESPIRATORY_TRACT | 0 refills | Status: DC | PRN

## 2019-07-25 MED ORDER — CETIRIZINE HCL 10 MG PO TABS: 10.0000 mg | ORAL_TABLET | Freq: Every day | ORAL | 0 refills | Status: DC

## 2019-07-25 MED ORDER — AEROCHAMBER PLUS FLO-VU MEDIUM MISC: 1.0000 | Freq: Once | 0 refills | Status: AC

## 2019-07-25 MED ORDER — PREDNISONE 20 MG PO TABS: 20.0000 mg | ORAL_TABLET | Freq: Every day | ORAL | 0 refills | Status: DC

## 2019-07-25 NOTE — Discharge Instructions (Addendum)
1 tab of steroid (prednisone) with breakfast for the next 5 days. Use inhaler with spacer 2 times a day (please have daughter show you how to use it, can watch video we saw on YouTube)  Tessalon for cough. Start flonase, atrovent nasal spray for nasal congestion/drainage. You can use over the counter nasal saline rinse such as neti pot for nasal congestion. Keep hydrated, your urine should be clear to pale yellow in color. Tylenol/motrin for fever and pain. Monitor for any worsening of symptoms, chest pain, shortness of breath, wheezing, swelling of the throat, go to the emergency department for further evaluation needed.

## 2019-07-25 NOTE — ED Triage Notes (Signed)
Cough, sneezing and runny nose since yesterday

## 2019-07-25 NOTE — ED Provider Notes (Signed)
[facility]    CSN: [id number] Arrival date & time: 07/25/19  1525      History   Chief Complaint Chief Complaint  Patient presents with  . Cough    HPI [name] is a 79 y.o. female with history of diabetes, hypertension, lupus, hypothyroidism, breast cancer presenting for URI symptoms since yesterday.  Endorsing dry cough, sneezing, and rhinorrhea.  Has been fully vaccinated for Covid since April.  No chest pain, shortness of breath, lower leg swelling, vomiting, diarrhea.  Has not take anything for symptoms.   Past Medical History:  Diagnosis Date  . Breast cancer ([place])   . Diabetes mellitus without complication ([place])   . Hypertension   . Hypothyroidism   . Lupus [facility])     Patient Active Problem List   Diagnosis Date Noted  . Acute cystitis 02/20/2019  . Near syncope 02/18/2019  . Hyponatremia 02/18/2019  . ILD (interstitial lung disease) ([place]) 07/30/2016  . Dyspnea 07/16/2016  . Bibasilar crackles 07/16/2016  . History of systemic lupus erythematosus (SLE) ([place]) 07/16/2016  . Cough 07/16/2016  . Encounter for monitoring ACE-inhibitor therapy 07/16/2016  . History of asthma 07/16/2016  . Fever, unspecified   . Hypothyroidism 01/03/2015  . HTN (hypertension) 01/03/2015  . Drug-induced hypersensitivity reaction 01/03/2015  . Lupus ([place]) 01/03/2015  . Pyrexia 01/02/2015    Past Surgical History:  Procedure Laterality Date  . BREAST LUMPECTOMY  2009    OB History   No obstetric history on file.      Home Medications    Prior to Admission medications   Medication Sig Start Date End Date Taking? Authorizing Provider  albuterol (VENTOLIN HFA) 108 (90 Base) MCG/ACT inhaler Inhale 2 puffs into the lungs every 4 (four) hours as needed for wheezing or shortness of breath. 07/25/19   Hall-Potvin, [place], PA-C  atorvastatin (LIPITOR) 20 MG tablet Take 1 tablet (20 mg total) by mouth daily. 07/11/19 10/09/19  Adrian Prows, MD  benzonatate (TESSALON)  100 MG capsule Take 1 capsule (100 mg total) by mouth every 8 (eight) hours. 07/25/19   Hall-Potvin, [place], PA-C  capsaicin (ZOSTRIX) 0.025 % cream Apply topically 3 (three) times daily as needed (for pain).  09/16/18   [provider]  cetirizine (ZYRTEC ALLERGY) 10 MG tablet Take 1 tablet (10 mg total) by mouth daily. 07/25/19   Hall-Potvin, [place], PA-C  fluticasone (FLONASE) 50 MCG/ACT nasal spray Place 1 spray into both nostrils daily. 07/25/19   Hall-Potvin, [place], PA-C  gabapentin (NEURONTIN) 300 MG capsule Take 300 mg by mouth 2 (two) times daily.    [provider]  glimepiride (AMARYL) 2 MG tablet Take 2 mg by mouth daily. 02/09/19   [provider]  hydroxychloroquine (PLAQUENIL) 200 MG tablet Take 1 tablet (200 mg total) by mouth daily. 01/06/15   Kelvin Cellar, MD  levothyroxine (SYNTHROID, LEVOTHROID) 75 MCG tablet Take 75 mcg by mouth daily before breakfast.  02/13/18   [provider]  losartan (COZAAR) 50 MG tablet Take 50 mg by mouth daily.    [provider]  omeprazole (PRILOSEC) 20 MG capsule Take 20 mg by mouth daily.    [provider]  predniSONE (DELTASONE) 20 MG tablet Take 1 tablet (20 mg total) by mouth daily. 07/25/19   Hall-Potvin, [place], PA-C  Rivaroxaban (XARELTO) 15 MG TABS tablet Take 1 tablet (15 mg total) by mouth daily with supper. 02/21/19   Shelda Pal, DO  Spacer/Aero-Holding Chambers (AEROCHAMBER PLUS FLO-VU MEDIUM) MISC 1  each by Other route once for 1 dose. 07/25/19 07/25/19  Hall-Potvin, [place], PA-C  Tiotropium Bromide Monohydrate (SPIRIVA RESPIMAT) 1.25 MCG/ACT AERS Inhale 2 puffs into the lungs daily. 06/21/19   Brand Males, MD  Vitamin D, Ergocalciferol, (DRISDOL) 1.25 MG (50000 UNIT) CAPS capsule Take 50,000 Units by mouth every Sunday.  02/09/19   [provider]    Family History Family History  Problem Relation Age of Onset  . Cancer Father        ? type     Social History Social History   Tobacco Use  . Smoking status: Never Smoker  . Smokeless tobacco: Former Systems developer    Types: Snuff  Substance Use Topics  . Alcohol use: No  . Drug use: No     Allergies   Ciprofloxacin   Review of Systems As per HPI   Physical Exam Triage Vital Signs ED Triage Vitals [07/25/19 1541]  Enc Vitals Group     BP (!) 145/81     Pulse Rate (!) 106     Resp 16     Temp 98 F (36.7 C)     Temp src      SpO2 96 %     Weight      Height      Head Circumference      Peak Flow      Pain Score      Pain Loc      Pain Edu?      Excl. in [place]?    No data found.  Updated Vital Signs BP (!) 145/81   Pulse (!) 106   Temp 98 F (36.7 C)   Resp 16   SpO2 96%   Visual Acuity Right Eye Distance:   Left Eye Distance:   Bilateral Distance:    Right Eye Near:   Left Eye Near:    Bilateral Near:     Physical Exam Constitutional:      General: She is not in acute distress.    Appearance: She is obese. She is not ill-appearing or diaphoretic.  HENT:     Head: Normocephalic and atraumatic.     Mouth/Throat:     Mouth: Mucous membranes are moist.     Pharynx: Oropharynx is clear. No oropharyngeal exudate or posterior oropharyngeal erythema.  Eyes:     General: No scleral icterus.    Conjunctiva/sclera: Conjunctivae normal.     Pupils: Pupils are equal, round, and reactive to light.  Neck:     Comments: Trachea midline, negative JVD Cardiovascular:     Rate and Rhythm: Normal rate and regular rhythm.     Heart sounds: No murmur heard.  No gallop.   Pulmonary:     Effort: Pulmonary effort is normal. No respiratory distress.     Breath sounds: No wheezing, rhonchi or rales.  Musculoskeletal:     Cervical back: Neck supple. No tenderness.  Lymphadenopathy:     Cervical: No cervical adenopathy.  Skin:    Capillary Refill: Capillary refill takes less than 2 seconds.     Coloration: Skin is not jaundiced or pale.     Findings: No  rash.  Neurological:     General: No focal deficit present.     Mental Status: She is alert and oriented to person, place, and time.      UC Treatments / Results  Labs (all labs ordered are listed, but only abnormal results are displayed) Labs Reviewed  NOVEL CORONAVIRUS, NAA  EKG   Radiology No results found.  Procedures Procedures (including critical care time)  Medications Ordered in UC Medications - No data to display  Initial Impression / Assessment and Plan / UC Course  I have reviewed the triage vital signs and the nursing notes.  Pertinent labs & imaging results that were available during my care of the patient were reviewed by me and considered in my medical decision making (see chart for details).     Patient afebrile, nontoxic, with SpO2 96%.  Patient followed routinely by her pulmonologist [facility]. Per chart review, she recently completed overnight pulse oximetry: Did not qualify for overnight O2 therapy.  Covid PCR pending.  Patient to quarantine until results are back.  We will treat supportively as outlined below.  Return precautions discussed, patient verbalized understanding and is agreeable to plan.  Final Clinical Impressions(s) / UC Diagnoses   Final diagnoses:  Cough  URI with cough and congestion     Discharge Instructions     1 tab of steroid (prednisone) with breakfast for the next 5 days. Use inhaler with spacer 2 times a day (please have daughter show you how to use it, can watch video we saw on YouTube)  Tessalon for cough. Start flonase, atrovent nasal spray for nasal congestion/drainage. You can use over the counter nasal saline rinse such as neti pot for nasal congestion. Keep hydrated, your urine should be clear to pale yellow in color. Tylenol/motrin for fever and pain. Monitor for any worsening of symptoms, chest pain, shortness of breath, wheezing, swelling of the throat, go to the emergency department for further evaluation  needed.     ED Prescriptions    Medication Sig Dispense Auth. Provider   albuterol (VENTOLIN HFA) 108 (90 Base) MCG/ACT inhaler Inhale 2 puffs into the lungs every 4 (four) hours as needed for wheezing or shortness of breath. 18 g Hall-Potvin, [place], PA-C   Spacer/Aero-Holding Chambers (AEROCHAMBER PLUS FLO-VU MEDIUM) MISC 1 each by Other route once for 1 dose. 1 each Hall-Potvin, [place], PA-C   benzonatate (TESSALON) 100 MG capsule Take 1 capsule (100 mg total) by mouth every 8 (eight) hours. 21 capsule Hall-Potvin, [place], PA-C   predniSONE (DELTASONE) 20 MG tablet Take 1 tablet (20 mg total) by mouth daily. 5 tablet Hall-Potvin, [place], PA-C   cetirizine (ZYRTEC ALLERGY) 10 MG tablet Take 1 tablet (10 mg total) by mouth daily. 30 tablet Hall-Potvin, [place], PA-C   fluticasone (FLONASE) 50 MCG/ACT nasal spray Place 1 spray into both nostrils daily. 16 g Hall-Potvin, [place], PA-C     PDMP not reviewed this encounter.   Hall-Potvin, [place], [place] 07/26/19 1229

## 2019-07-26 ENCOUNTER — Telehealth: Payer: Self-pay | Admitting: Internal Medicine

## 2019-07-26 LAB — NOVEL CORONAVIRUS, NAA: SARS-CoV-2, NAA: NOT DETECTED

## 2019-07-26 LAB — SARS-COV-2, NAA 2 DAY TAT

## 2019-07-26 NOTE — Telephone Encounter (Signed)
I tried her cell x 3 and msg states number not in service  LMTCB on her home number

## 2019-07-27 ENCOUNTER — Ambulatory Visit
Admission: RE | Admit: 2019-07-27 | Discharge: 2019-07-27 | Disposition: A | Discharge status: Home / Self Care | Payer: Medicare HMO | Source: Ambulatory Visit | Attending: Internal Medicine | Admitting: Internal Medicine

## 2019-07-27 DIAGNOSIS — J849 Interstitial pulmonary disease, unspecified: Secondary | ICD-10-CM

## 2019-07-27 DIAGNOSIS — I700 Atherosclerosis of aorta: Secondary | ICD-10-CM | POA: Diagnosis not present

## 2019-07-27 DIAGNOSIS — I2510 Atherosclerotic heart disease of native coronary artery without angina pectoris: Secondary | ICD-10-CM | POA: Diagnosis not present

## 2019-07-27 DIAGNOSIS — J479 Bronchiectasis, uncomplicated: Secondary | ICD-10-CM | POA: Diagnosis not present

## 2019-07-27 DIAGNOSIS — R590 Localized enlarged lymph nodes: Secondary | ICD-10-CM | POA: Diagnosis not present

## 2019-07-27 NOTE — Telephone Encounter (Signed)
LMTCB x2 for pt 

## 2019-07-28 NOTE — Telephone Encounter (Signed)
Called pt, the number is not in service. Will close encounter due to 3 failed attempts.

## 2019-08-06 ENCOUNTER — Other Ambulatory Visit: Payer: Self-pay | Admitting: Cardiology

## 2019-08-16 ENCOUNTER — Telehealth: Payer: Self-pay | Admitting: Internal Medicine

## 2019-08-16 NOTE — Telephone Encounter (Signed)
LMTCB with pharmacy of choice to send Spiriva.

## 2019-08-17 ENCOUNTER — Encounter: Payer: Self-pay | Admitting: Internal Medicine

## 2019-08-17 ENCOUNTER — Ambulatory Visit (INDEPENDENT_AMBULATORY_CARE_PROVIDER_SITE_OTHER): Payer: Medicare HMO | Admitting: Internal Medicine

## 2019-08-17 ENCOUNTER — Other Ambulatory Visit: Payer: Self-pay

## 2019-08-17 VITALS — BP 120/60 | HR 69 | Temp 98.1°F | Ht 67.0 in | Wt 155.4 lb

## 2019-08-17 DIAGNOSIS — R0600 Dyspnea, unspecified: Secondary | ICD-10-CM

## 2019-08-17 DIAGNOSIS — R0989 Other specified symptoms and signs involving the circulatory and respiratory systems: Secondary | ICD-10-CM

## 2019-08-17 DIAGNOSIS — J849 Interstitial pulmonary disease, unspecified: Secondary | ICD-10-CM | POA: Diagnosis not present

## 2019-08-17 DIAGNOSIS — M359 Systemic involvement of connective tissue, unspecified: Secondary | ICD-10-CM | POA: Diagnosis not present

## 2019-08-17 DIAGNOSIS — R0609 Other forms of dyspnea: Secondary | ICD-10-CM

## 2019-08-17 MED ORDER — SPIRIVA RESPIMAT 1.25 MCG/ACT IN AERS: 2.0000 | INHALATION_SPRAY | Freq: Every day | RESPIRATORY_TRACT | 0 refills | Status: DC

## 2019-08-17 NOTE — Patient Instructions (Signed)
ICD-10-CM   1. Dyspnea on exertion  R06.00   2. ILD (interstitial lung disease) ([place])  J84.9   3. 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 Pulmonary air trapping  R09.89     Shortness of breath there is partly because of your pulmonary fibrosis otherwise call interstitial lung disease The good news is that between January 2020 in July 2021 the pulmonary fibrosis is not worse In fact you do not need oxygen at this point in time However, I cannot explain the worsening shortness of breath  -This can be because of associated very mild air trapping that was seen in 2020 CT chest  -Can also be because of heart issues and I am glad you are having echocardiogram 09/01/2019  Plan -No indication for prednisone or antifibrotic therapy for pulmonary fibrosis -No indication for oxygen at this point in time -We can restart empiric Spiriva Respimat 2 puffs once daily and reassess -Keep appointment with cardiology -Refer to pulmonary rehabilitation -No indication of pulmonary function test due to technical difficulties doing it  Follow-up -9 months and a 30-minute slot to interstitial lung disease clinic  -Symptom questionnaire and simple walk test at follow-up

## 2019-08-17 NOTE — Addendum Note (Signed)
Addended by: Lorretta Harp on: 08/17/2019 02:19 PM   Modules accepted: Orders

## 2019-08-17 NOTE — Progress Notes (Signed)
   PCP Osei-Bonsu, George, MD  HPI  IOV 07/16/2016  Chief Complaint  Patient presents with  . Shortness of Breath    consult for sob referring doctor is Dr. Ganji patient states that she is very sob and using the inhaler helps her very much     According to the referral chart from Dr. Ganji, he'll originally saw her on 06/26/2016 for shortness of breath at follow-up. Patient reported to him that she's had insidious onset of shortness of breath for several years with progression in the last several months associated with bilateral chest tightness. Apparently patient was given a diagnosis of asthma and bronchodilator use does help her. Per the patient history spirometry testing outside was abnormal. Dyspnea is significant enough patient not able to do her household work. There is associated fatigue. A trial of diuresis by the primary care physician did help the shortness of breath. Patient's symptoms are associated with lisinopril intake for hypertension.  exhaled nitric oxide test today in the office is 19 ppb and normal.  IgE. ''Walking desaturation test on 07/16/2016 185 feet x 3 laps on room air forehead probe:  did NOT desaturate. Rest pulse ox was 100%, final pulse ox was 100%. HR response 75/min at rest to 85/min at peak exertion. 2-D echocardiogram 02/21/2016 as reviewed from the outside chart shows mild concentric left ventricular remodeling with ejection fraction 79. Normal right ventricle size and contractility. No evidence of diastolic dysfunction. No pericardial effusion    In talking to the patient and her daughter [name] the above information was confirmed. In addition she is on Advair discus for the last few months because of the above problems. It is helping her. She takes it as needed which ends up being every other day once. Today the exhaled nitric oxide was normal 16 ppb which is essentially on Advair. She is also on lisinopril for the last few years. She does have associated  mild cough. Evaluation by cardiology did show crackles in the concern is he has interstitial lung disease therefore she is referred here. Associated with concern of interstitial lung disease is a history of lupus that has been present for many years. When she relocated to [place] few years ago with a primary care physician has been following this. Patient is on plaquenil but not steroids The family is unsure about SLE blood work  Results for [name], Kathryn (MRN [id number]) as of 07/16/2016 10:45  Ref. Range 01/05/2015 09:31 01/12/2015 16:07 01/12/2015 19:45 01/12/2015 20:00 01/12/2015 20:02  Hemoglobin Latest Ref Range: 12.0 - 15.0 g/dL 10.4 (L) 11.9 (L)        OV 07/30/2016  Chief Complaint  Patient presents with  . Follow-up    Pt was here for a PFT but was unable to complete it. Pt continues to be SOB with some improvement, occ. dry cough.   Here to follow-up for pulmonary test results for interstitial lung disease workup. In terms of test results her ESR is high ANA is positive and her Sjogren's antibodies positive but lupus antibodies and rheumatoid factor antibodies are negative. Her CT scan does show interstitial lung disease at the base. She could not complete pulmonary function test due to inability to follow direction but I suspect the disease is mild because of walking desaturation test last visit was mild. Her daughter and she appeared to understand these test results. She reports dyspnea and cough. She is on ACE inhibitor.    OV 02/02/2017  Chief Complaint  Patient presents with  .   Follow-up    Pt states she  has been doing good since last visit. Denies any complaints of cough, SOB, or CP.   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  In the interim she has no complaints.  She only has mild dyspnea on exertion.  Walking desaturation test on 185 feet x3 laps on room air.  Resting pulse ox 100%.  Final  pulse ox 97%.  Resting heart rate was 70/min.  Final heart rate was 87/min.  She did see a rheumatologist Dr. Angela Hawkes in practice last month.  I reviewed the note.  It appears that they are not finding any other systemic evidence of autoimmune disease.  Therefore she is still maintained on Plaquenil.  According to the notes patient is also Smith and RNP antibody positive.  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  Most recent CT scan of the chest in summer 2018 as a possible/probable UIP.  Results for [name], Kathryn (MRN [id number]) as of 07/30/2016 10:00  Ref. Range 07/16/2016 12:09 07/16/2016 12:18  Nitric Oxide Unknown  16  Sed Rate Latest Ref Range: 0 - 30 mm/hr 77 (H)   Anit Nuclear Antibody(ANA) Latest Ref Range: NEGATIVE  POS (A)   ANA Pattern 1 Unknown NUCLEOLAR (A)   ANA Titer 1 Latest Units: titer 1:320 (H)   Cyclic Citrullin Peptide Ab Latest Units: Units <16   ds DNA Ab Latest Units: IU/mL 1   RA Latex Turbid. Latest Ref Range: <14 IU/mL <14   SSA (Ro) (ENA) Antibody, IgG Latest Ref Range: <1.0 NEG AI  >8.0 POS (H)   SSB (La) (ENA) Antibody, IgG Latest Ref Range: <1.0 NEG AI  <1.0 NEG   Scleroderma (Scl-70) (ENA) Antibody, IgG Latest Ref Range: <1.0 NEG AI  <1.0 NEG     IMPRESSION: CT CHEST HIGH RESOLUTION 1. 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 Differential includes fibrotic nonspecific interstitial pneumonia (NSIP). Follow-up high-resolution chest CT in 12 months would be useful to assess temporal pattern stability, as clinically warranted. 2. Solitary 3 mm solid right upper lobe pulmonary nodule. No follow-up needed if patient is low-risk. Non-contrast chest CT can be considered in 12 months if patient is high-risk. This recommendation follows the consensus statement: Guidelines for Management of Incidental  Pulmonary Nodules Detected on CT Images: From the Fleischner Society 2017; Radiology 2017; 284:228-243. 3. Left main and 3 vessel coronary atherosclerosis. 4. Small hiatal hernia. 5. Diffuse hepatic steatosis.  Aortic Atherosclerosis (ICD10-I70.0) and Emphysema (ICD10-J43.9).   Electronically Signed   By: Jason A Poff M.D.   On: 07/22/2016 14:17  OV 05/18/2017  Chief Complaint  Patient presents with  . Follow-up    PFT attempted today but pt was unable to complete as per Lindsay pt did not want to do the PFT.  HRCT done 1/21.  Pt states she has been doing well. Breathing is about the same as last visit. Denies any complaints or concerns.    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  In the interim overall she is stable.  She is an extremely poor historian.  After asking her multiple times in different ways she believes earlier this year she had a cardiac stress test for a coronary artery calcification by Dr. Ganji and this was normal.  She is completely   unaware of the fact that she has interstitial lung disease.  She believes she has asthma but when I questioned her and directed her that I have explained to her that she has pulmonary fibrosis she then admitted to the in fact she understands that she has pulmonary fibrosis.  Overall she is stable she has mild dyspnea on exertion without any cough.  She thinks she had lupus diagnosed by a rheumatologist in [place] but when I told her that there is no systemic evidence of autoimmune disease when she saw [place] rheumatology she seems surprised that she even made that visit.  There are no new issues.   Walking desaturation test on 05/18/2017 185 feet x 3 laps on ROOM AIR:  did not have symptioms or  desaturate. Rest pulse ox was 100%, final pulse ox was 95%. HR response 69/min at rest to 90/min at peak exertion. Patient Lyndel Whitehurst  Did not Desaturate < 88% .  Shaira Penny yes did  Desaturated </= 3% points. Tayte Flott yes did get tachyardic   IMPRESSION: Lungs/Pleura: No pneumothorax. No pleural effusion. Posterior right upper lobe 3 mm solid pulmonary nodule (series 5/image 39), stable since 07/22/2016 chest CT. No acute consolidative airspace disease, lung masses or new significant pulmonary nodules. No significant air trapping on the expiration sequence. There is patchy confluent subpleural reticulation and ground-glass attenuation in both lungs with associated mild traction bronchiectasis and minimal architectural distortion. There is mild honeycombing throughout both lung bases. Findings have not convincingly progressed since 07/22/2016 chest CT. 1. No appreciable interval progression of basilar predominant fibrotic interstitial lung disease with mild honeycombing in the intervening 6 months. Findings are still considered to represent probable usual interstitial pneumonia (UIP), with the differential including fibrotic phase nonspecific interstitial pneumonia (NSIP). Follow-up high-resolution chest CT recommended in 12 months to assess ongoing temporal pattern stability. 2. Tiny right upper lobe pulmonary nodule is stable for 6 months and probably benign. 3. Left main and 3 vessel coronary atherosclerosis. 4. Small hiatal hernia.  Aortic Atherosclerosis (ICD10-I70.0).   Electronically Signed   By: Jason A Poff M.D.   On: 02/15/2017 16:05   OV 08/18/2017  Chief Complaint  Patient presents with  . Follow-up    PFT attempted today but pt was unable to complete.  Pt states she is about the same since last visit. Denies any real complaints of SOB and denies any real complaints.    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 Immunomodulators of probably due to toxic follow-up.  Moreover her ILD appears stable over time. Therefore we decided to treat her with  Spiriva. She is here for follow-up she tells me she is feeling better. She likes her Spiriva. This is actually helping her shortness of breath. Walking desaturation test is essentially normal. She had a pulmonary function test today but she could not follow through with instructions. There are no other new issues. She denies any ER visits   OV 02/15/2018  Subjective:  Patient ID: [name], female , DOB: 12/04/1940 , age 77 y.o. , MRN: [id number] , ADDRESS: [address]  [place] [address]   02/15/2018 -   Chief Complaint  Patient presents with  . Follow-up    Pt states she has been doing well since last visit. States she does become SOB when she exerts herself and states she does have an occ.   dry cough. Denies any complaints of CP/chest tightness.    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 -last seen April 2019.  Is a routine follow-up for the above issues.  She expresses that she has dry eyes and dry mouth.  She again does not recollect seeing a rheumatologist.  She is on supportive care but she is on Plaquenil.  Therefore she is probably seeing a rheumatologist.  She is unable to do pulmonary function test therefore she did a walk test with us and it is stable and documented below she also had high-resolution CT chest that I visualized as documented below.  She has UIP pattern but it is stable and a year and a half now.  She has no worsening dyspnea.  No interim complaints.  She does have coronary artery calcification on the CT scan but she says she saw Dr. Ganji approximately a year ago and a stress test was normal.   She continues with Spiriva for the associated emphysema from remote smoking.  This is helping her.  We discussed about taking anti-fibrotic's which would be indicated if this was IPF but because she has autoimmune features it would be indicated only if she got worse.  In either event she seems somewhat nervous about taking a drug that has GI side effect potential.  She wants to wait and watch.     OV 06/14/2019  Subjective:  Patient ID: Danilynn Crooker, female , DOB: 03/29/1940 , age 79 y.o. , MRN: [id number] , ADDRESS: [address] [place] [address] PCP Osei-Bonsu, George, MD Cards- Dr Ganji Rheum - Dr angela Hawkes  06/14/2019 -   Chief Complaint  Patient presents with  . Follow-up    Pt states she has been doing good since last visit and denies any complaints.   Follow-up IPAF (UIP with autoimmune serology; June 2018 ANA 1:320, SSA > 8, dry eyes/mount + eSR 71)     - on supportive care due to goals of care (could not do PFT technically 2019) -   Follow-up mild associated emphysema on CT scan   - on spiriva  HPI [name] 79 y.o. -presents for follow-up.  Not seen her in over a year because of the COVID-19 pandemic.  She tells me that steadily and slowly in the last 1 year her shortness of breath with exertion has progressed.  Her effort tolerance is lower.  She states that walking in our office she felt more dyspneic than in the past.  In fact walking in our office her pulse ox dropped 8 points.  This is a change for her.  She does not want to do pulmonary function test because of technical challenges with her doing PFTs.  She prefers other modes of monitoring.  This would include CT scan of the chest.  At last visit she did not want entertain the idea of antifibrotic therapy.  She preferred supportive care but now that she is declining she is more open to the idea.  Her symptom scores are listed below.  In the interim she did get admitted to the hospital in January 2021 with  hyponatremia.  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    She is on Xarelto.    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  There is mention of intact RV function but no mention of pulmonary artery pressures this is based on review of the chart.  Labs in January 2021 shows a creatinine 1.13 mg percent with a GFR of 47 and a hemoglobin of 11.3 g%.. She was evaluated by Dr. Ganji.  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   OV 08/17/2019   Subjective:  Patient ID: [name], female , DOB: 07/28/1940, age 79 y.o. years. , MRN: [id number],  ADDRESS: [address] [place] [address] PCP  Osei-Bonsu, George, MD Providers : Treatment Team:  Attending Provider: Carlester Kasparek, MD  Follow-up IPAF (UIP with autoimmune serology; June 2018 ANA 1:320, SSA > 8, dry eyes/mount + eSR 71)     - on supportive care due to goals of care (could not do PFT technically 2019) -   Follow-up mild associated emphysema on CT scan   - on spiriva  Chief Complaint  Patient presents with  . Follow-up    pt states she has been doing okay since last visit. States that her breathing has become a little worse. Pt denies any complaints of cough, chest tightness, or wheezing.       HPI [name] 79 y.o. -presents for follow-up.  Last seen in May 2021 at the time she is complaining of more shortness of breath.  So we did overnight oxygen study.  This was done in June 2021.  No desaturations.  No oxygen needed.  Today she presents again and she tells me that she has shortness of breath with exertion relieved by rest.  Similar to slightly worse compared to before as documented below.  We did a high-resolution CT chest with is unchanged.  Is consistent with UIP.  She tells me that Spiriva helped her in the past.  However there is no air-trapping emphysema mentioned in the  current CT chest.  2020 January CT chest but does mention some air trapping.  But there is no emphysema she never smoked.  She wants to try Spiriva again.  She has upcoming visit with her cardiologist in August 2021 associated with echocardiogram.  There are no other new issues.    SYMPTOM SCALE - ILD 06/14/2019  08/17/2019   O2 use ra ra  Shortness of Breath 0 -> 5 scale with 5 being worst (score 6 If unable to do)   At rest 0 2  Simple tasks - showers, clothes change, eating, shaving 1 2  Household (dishes, doing bed, laundry) 3 3  Shopping 5 4  Walking level at own pace 3 3  Walking up Stairs 5 5  Total (30-36) Dyspnea Score 17 19  How bad is your cough? 2   How bad is your fatigue 3   How bad is nausea 00   How bad is vomiting?  0   How bad is diarrhea? 0   How bad is anxiety? 0 0  How bad is depression 0 1         Simple office walk 185 feet x  3 laps goal with forehead probe 08/18/2017    02/15/2018  06/14/2019   O2 used *room air Room air ra  Number laps completed 3 3 3  Comments about pace Normal pace Slow pace Slow pace  Resting Pulse Ox/HR 100%   and 67/min 100% and 73/min 100% and 63/mi  Final Pulse Ox/HR 100% and 91/min 100% and 96/min 92% and 84/mi  Desaturated </= 88% no No  no  Desaturated <= 3% points no no Yues, 8 ponts  Got Tachycardic >/= 90/min yes yes no  Symptoms at end of test Mild dyspnea Mild dyspnea Mild dyspnea  Miscellaneous comments x x       Results for SHANNIN, NAAB (MRN [id number]) as of 06/14/2019 10:26  Ref. Range 05/18/2017 12:44  FVC-Pre Latest Units: L 1.61  FVC-%Pred-Pre Latest Units: % 63     COMPARISON:  Chest CT 02/11/2018. -> July 2021  FINDINGS: Cardiovascular: Heart size is normal. There is no significant pericardial fluid, thickening or pericardial calcification. There is aortic atherosclerosis, as well as atherosclerosis of the great vessels of the mediastinum and the coronary arteries, including calcified  atherosclerotic plaque in the left main, left anterior descending, left circumflex and right coronary arteries. Calcifications of the mitral annulus.  Mediastinum/Nodes: No pathologically enlarged mediastinal or hilar lymph nodes. Please note that accurate exclusion of hilar adenopathy is limited on noncontrast CT scans. Esophagus is unremarkable in appearance. No axillary lymphadenopathy.  Lungs/Pleura: High-resolution images again demonstrate areas of septal thickening, subpleural reticulation, parenchymal banding, traction bronchiectasis, peripheral bronchiolectasis and mild honeycombing. These findings have a definitive craniocaudal gradient. No significant progression of disease compared to the prior study. Inspiratory and expiratory imaging is unremarkable. No acute consolidative airspace disease. No pleural effusions. No suspicious appearing pulmonary nodules or masses are noted. No acute consolidative airspace disease. No pleural effusions.  Upper Abdomen: Aortic atherosclerosis.  Musculoskeletal: There are no aggressive appearing lytic or blastic lesions noted in the visualized portions of the skeleton.  IMPRESSION: 1. The appearance of the lungs is compatible with interstitial lung disease, with a spectrum of findings considered diagnostic of usual interstitial pneumonia (UIP) per current ATS guidelines. No significant progression of disease compared to the prior examination. 2. Aortic atherosclerosis, in addition to left main and 3 vessel coronary artery disease. Assessment for potential risk factor modification, dietary therapy or pharmacologic therapy may be warranted, if clinically indicated.  Aortic Atherosclerosis (ICD10-I70.0).   Electronically Signed   By: Vinnie Langton M.D.   On: 07/29/2019 11:01 ROS - per HPI      has a past medical history of Breast cancer ([place]), Diabetes mellitus without complication ([place]), Hypertension, Hypothyroidism,  and Lupus ([place]).   reports that she has never smoked. She quit smokeless tobacco use about 4 years ago.  Her smokeless tobacco use included snuff.  Past Surgical History:  Procedure Laterality Date  . BREAST LUMPECTOMY  2009    Allergies  Allergen Reactions  . Ciprofloxacin Itching, Swelling and Other (See Comments)    Possibly causing tremors?    Immunization History  Administered Date(s) Administered  . Influenza, High Dose Seasonal PF 11/09/2016, 10/27/2018  . Influenza,inj,Quad PF,6+ Mos 10/27/2015  . PFIZER SARS-COV-2 Vaccination 04/22/2019, 05/13/2019  . Pneumococcal Conjugate-13 02/15/2018    Family History  Problem Relation Age of Onset  . Cancer Father        ? type     Current Outpatient Medications:  .  albuterol (VENTOLIN HFA) 108 (90 Base) MCG/ACT inhaler, Inhale 2 puffs into the lungs every 4 (four) hours as needed for wheezing or shortness of breath., Disp: 18 g, Rfl: 0 .  atorvastatin (LIPITOR) 20 MG tablet, TAKE 1 TABLET(20 MG) BY MOUTH DAILY, Disp: 90 tablet, Rfl: 3 .  capsaicin (ZOSTRIX) 0.025 % cream,  Apply topically 3 (three) times daily as needed (for pain). , Disp: , Rfl:  .  gabapentin (NEURONTIN) 300 MG capsule, Take 300 mg by mouth 2 (two) times daily., Disp: , Rfl:  .  glimepiride (AMARYL) 2 MG tablet, Take 2 mg by mouth daily., Disp: , Rfl:  .  hydroxychloroquine (PLAQUENIL) 200 MG tablet, Take 1 tablet (200 mg total) by mouth daily., Disp: 30 tablet, Rfl: 0 .  levothyroxine (SYNTHROID, LEVOTHROID) 75 MCG tablet, Take 75 mcg by mouth daily before breakfast. , Disp: , Rfl:  .  losartan (COZAAR) 50 MG tablet, Take 50 mg by mouth daily., Disp: , Rfl:  .  omeprazole (PRILOSEC) 20 MG capsule, Take 20 mg by mouth daily., Disp: , Rfl:  .  Rivaroxaban (XARELTO) 15 MG TABS tablet, Take 1 tablet (15 mg total) by mouth daily with supper., Disp: 60 tablet, Rfl: 0 .  Tiotropium Bromide Monohydrate (SPIRIVA RESPIMAT) 1.25 MCG/ACT AERS, Inhale 2 puffs into the  lungs daily., Disp: 4 g, Rfl: 3      Objective:   Vitals:   08/17/19 1342  BP: (!) 120/60  Pulse: 69  Temp: 98.1 F (36.7 C)  TempSrc: Oral  SpO2: 99%  Weight: 155 lb 6.4 oz (70.5 kg)  Height: '5\' 7"'$  (1.702 m)    Estimated body mass index is 24.34 kg/m as calculated from the following:   Height as of this encounter: '5\' 7"'$  (1.702 m).   Weight as of this encounter: 155 lb 6.4 oz (70.5 kg).  '@WEIGHTCHANGE'$ @  [employer]   08/17/19 1342  Weight: 155 lb 6.4 oz (70.5 kg)     Physical Exam Tall thin female.  Alert and oriented x3.  No neck nodes.  Bilateral bibasal Velcro crackles present.  No stigmata of connective tissue disease abdomen soft normal heart sounds.       Assessment:       ICD-10-CM   1. Dyspnea on exertion  R06.00   2. ILD (interstitial lung disease) ([place])  J84.9   3. 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 Pulmonary air trapping  R09.89        Plan:     Patient Instructions     ICD-10-CM   1. Dyspnea on exertion  R06.00   2. ILD (interstitial lung disease) ([place])  J84.9   3. 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 Pulmonary air trapping  R09.89     Shortness of breath there is partly because of your pulmonary fibrosis otherwise call interstitial lung disease The good news is that between January 2020 in July 2021 the pulmonary fibrosis is not worse In fact you do not need oxygen at this point in time However, I cannot explain the worsening shortness of breath  -This can be because of associated very mild air trapping that was seen in 2020 CT chest  -Can also be because of heart issues and I am glad you are having echocardiogram 09/01/2019  Plan -No indication for prednisone or antifibrotic therapy for pulmonary fibrosis -No indication for oxygen at this point in time -We can restart empiric Spiriva Respimat 2 puffs once daily and reassess -Keep appointment with cardiology -Refer to pulmonary  rehabilitation -No indication of pulmonary function test due to technical difficulties doing it  Follow-up -9 months and a 30-minute slot to interstitial lung disease clinic  -Symptom questionnaire and simple walk test at follow-up     SIGNATURE    Dr. Brand Males, M.D., F.C.C.P,  Pulmonary and Critical Care Medicine Staff Physician, [place] Director - Interstitial Lung Disease  Program  Pulmonary [facility] [place], [place], [address]  Pager: [phone], If no answer or between  15:00h - 7:00h: call [phone] Telephone: [phone]  2:08 PM 08/17/2019

## 2019-08-18 NOTE — Telephone Encounter (Signed)
Called and left message for patient to return call.  

## 2019-08-22 NOTE — Telephone Encounter (Signed)
Attempted to call pt but unable to reach. Left message for her to return call. Due to multiple attempts trying to reach pt and unable to do so, per triage protocol encounter will be closed. 

## 2019-09-01 ENCOUNTER — Ambulatory Visit: Payer: Medicare HMO

## 2019-09-01 ENCOUNTER — Other Ambulatory Visit: Payer: Self-pay

## 2019-09-01 ENCOUNTER — Other Ambulatory Visit: Payer: Medicare Other

## 2019-09-01 DIAGNOSIS — I38 Endocarditis, valve unspecified: Secondary | ICD-10-CM | POA: Diagnosis not present

## 2019-09-15 ENCOUNTER — Other Ambulatory Visit: Payer: Self-pay

## 2019-09-15 ENCOUNTER — Ambulatory Visit: Payer: Medicare Other | Admitting: Cardiology

## 2019-09-15 ENCOUNTER — Encounter: Payer: Self-pay | Admitting: Cardiology

## 2019-09-15 VITALS — BP 120/69 | HR 73 | Resp 15 | Ht 67.0 in | Wt 154.0 lb

## 2019-09-15 DIAGNOSIS — I38 Endocarditis, valve unspecified: Secondary | ICD-10-CM | POA: Diagnosis not present

## 2019-09-15 DIAGNOSIS — J849 Interstitial pulmonary disease, unspecified: Secondary | ICD-10-CM | POA: Diagnosis not present

## 2019-09-15 DIAGNOSIS — M329 Systemic lupus erythematosus, unspecified: Secondary | ICD-10-CM | POA: Diagnosis not present

## 2019-09-15 DIAGNOSIS — R0609 Other forms of dyspnea: Secondary | ICD-10-CM

## 2019-09-15 DIAGNOSIS — R0600 Dyspnea, unspecified: Secondary | ICD-10-CM | POA: Diagnosis not present

## 2019-09-15 DIAGNOSIS — E782 Mixed hyperlipidemia: Secondary | ICD-10-CM | POA: Diagnosis not present

## 2019-09-15 DIAGNOSIS — N1832 Chronic kidney disease, stage 3b: Secondary | ICD-10-CM

## 2019-09-15 NOTE — Progress Notes (Signed)
Primary Physician/Referring:  Benito Mccreedy, MD  Patient ID: [name], female    DOB: Sep 21, 1940, 79 y.o.   MRN: [id number]  Chief Complaint  Patient presents with  . Follow-up    6 month  . Endocarditis   HPI:    [name]  is a 79 y.o. 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  She is presently on chronic Xarelto due to high risk for cardioembolic phenomena.  She follows Dr. Chase Caller for her ILD, has chronic shortness of breath that has gradually progressed, was recently evaluated by 6-minute walk test and appears to be stable.  She also underwent repeat CT scan, high-resolution chest and now presents for follow-up. No changes in her weight or leg edema. No PND or orthopnea.  Past Medical History:  Diagnosis Date  . Breast cancer ([place])   . Diabetes mellitus without complication ([place])   . Hypertension   . Hypothyroidism   . Lupus [facility])    Past Surgical History:  Procedure Laterality Date  . BREAST LUMPECTOMY  2009   Family History  Problem Relation Age of Onset  . Cancer Father        ? type  . Asthma Brother     Social History   Tobacco Use  . Smoking status: Never Smoker  . Smokeless tobacco: Former Systems developer    Types: Snuff  Substance Use Topics  . Alcohol use: No   Marital Status: Single  ROS  Review of Systems  Cardiovascular: Positive for dyspnea on exertion. Negative for chest pain and leg swelling.  Musculoskeletal: Positive for arthritis and back pain.  Gastrointestinal: Negative for melena.   Objective  Blood pressure 120/69, pulse 73, resp.  rate 15, height 5\' 7"  (1.702 m), weight 154 lb (69.9 kg), SpO2 97 %.  Vitals with BMI 09/15/2019 08/17/2019 07/25/2019  Height 5\' 7"  5\' 7"  -  Weight 154 lbs 155 lbs 6 oz -  BMI 58.09 98.33 -  Systolic 825 053 976  Diastolic 69 60 81  Pulse 73 69 106     Physical Exam Cardiovascular:     Rate and Rhythm: Normal rate and regular rhythm.     Pulses: Intact distal pulses.     Heart sounds: Normal heart sounds. No murmur heard.  No gallop.      Comments: No leg edema, no JVD. Pulmonary:     Effort: Pulmonary effort is normal.     Breath sounds: Rales (coarse bilateral basal leathery crackles) present.  Abdominal:     General: Bowel sounds are normal.     Palpations: Abdomen is soft.    Laboratory examination:   Recent Labs    02/17/19 2117 02/18/19 0233 02/20/19 0248  NA 121* 126* 131*  K 3.6 4.4 4.5  CL 88* 94* 97*  CO2 23 22 24   GLUCOSE 63* 50* 121*  BUN 13 13 13   CREATININE 1.34* 1.23* 1.13*  CALCIUM 9.1 9.2 9.4  GFRNONAA 38* 43* 47*  GFRAA 44* 49* 55*   CrCl cannot be calculated (Patient's most recent lab result is older than the maximum 21 days allowed.).  CMP Latest Ref Rng & Units 02/20/2019 02/18/2019 02/17/2019  Glucose  70 - 99 mg/dL 121(H) 50(L) 63(L)  BUN 8 - 23 mg/dL 13 13 13   Creatinine 0.44 - 1.00 mg/dL 1.13(H) 1.23(H) 1.34(H)  Sodium 135 - 145 mmol/L 131(L) 126(L) 121(L)  Potassium 3.5 - 5.1 mmol/L 4.5 4.4 3.6  Chloride 98 - 111 mmol/L 97(L) 94(L) 88(L)  CO2 22 - 32 mmol/L 24 22 23   Calcium 8.9 - 10.3 mg/dL 9.4 9.2 9.1  Total Protein 6.5 - 8.1 g/dL 7.8 8.0 -  Total Bilirubin 0.3 - 1.2 mg/dL 0.5 0.3 -  Alkaline Phos 38 - 126 U/L 50 57 -  AST 15 - 41 U/L 23 27 -  ALT 0 - 44 U/L 18 19 -   CBC Latest Ref Rng & Units 02/20/2019 02/18/2019 02/17/2019  WBC 4.0 - 10.5 K/uL 4.8 5.7 5.7  Hemoglobin 12.0 - 15.0 g/dL 11.3(L) 12.2 12.7  Hematocrit 36 - 46 % 33.3(L) 36.8 37.5  Platelets 150 - 400 K/uL 212 225 238   Lipid Panel Recent Labs    02/20/19 0814  CHOL  152  TRIG 134  LDLCALC 88  VLDL 27  HDL 37*  CHOLHDL 4.1     HEMOGLOBIN A1C Lab Results  Component Value Date   HGBA1C 6.6 (H) 01/03/2015   MPG 143 01/03/2015   TSH Recent Labs    02/18/19 0113  TSH 4.495   Medications and allergies   Allergies  Allergen Reactions  . Ciprofloxacin Itching, Swelling and Other (See Comments)    Possibly causing tremors?  . Crestor [Rosuvastatin] Other (See Comments)    Myalgia and back pain     Outpatient Medications Prior to Visit  Medication Sig Dispense Refill  . atorvastatin (LIPITOR) 20 MG tablet TAKE 1 TABLET(20 MG) BY MOUTH DAILY 90 tablet 3  . capsaicin (ZOSTRIX) 0.025 % cream Apply topically 3 (three) times daily as needed (for pain).     Marland Kitchen gabapentin (NEURONTIN) 300 MG capsule Take 300 mg by mouth 2 (two) times daily.    Marland Kitchen glimepiride (AMARYL) 2 MG tablet Take 2 mg by mouth daily.    . hydroxychloroquine (PLAQUENIL) 200 MG tablet Take 1 tablet (200 mg total) by mouth daily. 30 tablet 0  . levothyroxine (SYNTHROID, LEVOTHROID) 75 MCG tablet Take 75 mcg by mouth daily before breakfast.     . losartan (COZAAR) 50 MG tablet Take 50 mg by mouth daily.    Marland Kitchen omeprazole (PRILOSEC) 20 MG capsule Take 20 mg by mouth daily.    . Rivaroxaban (XARELTO) 15 MG TABS tablet Take 1 tablet (15 mg total) by mouth daily with supper. 60 tablet 0  . albuterol (VENTOLIN HFA) 108 (90 Base) MCG/ACT inhaler Inhale 2 puffs into the lungs every 4 (four) hours as needed for wheezing or shortness of breath. (Patient not taking: Reported on 09/15/2019) 18 g 0  . Tiotropium Bromide Monohydrate (SPIRIVA RESPIMAT) 1.25 MCG/ACT AERS Inhale 2 puffs into the lungs daily. (Patient not taking: Reported on 09/15/2019) 4 g 3  . Tiotropium Bromide Monohydrate (SPIRIVA RESPIMAT) 1.25 MCG/ACT AERS Inhale 2 puffs into the lungs daily. (Patient not taking: Reported on 09/15/2019) 4 g 0   No facility-administered medications prior to visit.     Radiology:   CT head without  contrast and CT cervical spine without contrast 02/18/2019: 1. No acute intracranial pathology. 2. No acute/traumatic cervical spine pathology. Multilevel degenerative changes most prominent at C4-C5.  HRCT chest 07/29/2019: 1. The appearance of the lungs is compatible with interstitial lung disease, with a spectrum of findings  considered diagnostic of usual interstitial pneumonia (UIP) per current ATS guidelines. No significant progression of disease compared to the prior  examination. 2. Aortic atherosclerosis, in addition to left main and 3 vessel coronary artery disease. Assessment for potential risk factor modification, dietary therapy or pharmacologic therapy may be warranted, if clinically indicated. 3. Aortic Atherosclerosis (ICD10-I70.0).  Cardiac Studies:   Lexiscan myoview stress test 07/03/2016: 1. The resting electrocardiogram demonstrated normal sinus rhythm, normal resting conduction, PVC and normal rest repolarization. Stress EKG is non-diagnostic for ischemia as it a pharmacologic stress using Lexiscan. Stress symptoms included dyspnea. 2. Myocardial perfusion imaging is normal. Overall left ventricular systolic function was normal without regional wall motion abnormalities. The left ventricular ejection fraction was 75%.  Carotid artery duplex  02/18/2019: Right Carotid: Velocities in the right ICA are consistent with a 1-39% stenosis. Left Carotid: Velocities in the left ICA are consistent with a 1-39% stenosis. Vertebrals: Bilateral vertebral arteries demonstrate antegrade flow. Subclavians: Normal flow hemodynamics were seen in bilateral subclavian arteries.  Echocardiogram 09/01/2019:  Normal LV systolic function with visual EF 60-65%. Left ventricle cavity  is normal in size. Mild left ventricular hypertrophy. Normal global wall  motion. Calculated EF 58%.  At the tip of the anterior mitral leaflet illustrates a calcified  echodensity measuring 0.27 x 0.39cm.  Differential diagnosis may include  healed vegetation or libman sacks endocarditis in setting of lupus. Mild  (Grade I) mitral regurgitation.  Mild tricuspid regurgitation. No evidence of pulmonary hypertension.  Mild to moderate pulmonic regurgitation.  Compared to previous study 02/18/2019 no significant change except the  calcified echodensity now measures smaller.  Clinical correlation required, if warranted may consider transesophageal  echocardiogram.  EKG  EKG 09/15/2019: Normal sinus rhythm at rate of 75 bpm, left atrial enlargement, left axis deviation.  Poor R wave progression, cannot exclude anteroseptal infarct old.  PACs (2).  Nonspecific T abnormality.    EKG 02/17/2019: Normal sinus rhythm at 61 bpm, normal axis, PRWP cannot exclude anterior infarct old. Nonspecific T wave abnormality.  Assessment     ICD-10-CM   1. [place] endocarditis  I38 EKG 12-Lead    PCV ECHOCARDIOGRAM COMPLETE  2. Dyspnea on exertion  R06.00 PCV ECHOCARDIOGRAM COMPLETE  3. History of systemic lupus erythematosus (SLE) (H[facility])  M32.9   4. ILD (interstitial lung disease) ([place])  J84.9   5. Mixed hyperlipidemia  E78.2   6. Stage 3b chronic kidney disease  N18.32     There are no discontinued medications.  No orders of the defined types were placed in this encounter.   Recommendations:   [name] is a 79 y.o. 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  She is presently on chronic Xarelto due to high risk for cardioembolic phenomena.  Hypercoagulable workup was negative.  Gradually worsening  dyspnea is probably related to underlying deconditioning and patient has not been physically active.  Do not suspect progression of coronary disease although it cannot be excluded completely.  Blood pressure is well controlled, lipids are fairly well controlled, she is on statins and tolerating Lipitor.  Also recent echocardiogram and also high-resolution CT scan does not show any evidence of pulmonary hypertension.  Also was evaluated by Dr. Chase Caller and felt that her 6-minute walk test was fairly stable without hypoxemia.    Hence we will continue present medications, I will see her back in 6 months with repeat echocardiogram to evaluate Libman-Sacks endocarditis lesions on the mitral valve.  If lesions are completely resolved, I could consider discontinuing Xarelto.  Fortunately she has not had any bleeding diathesis.  Her daughter is present on the phone and all questions answered.  Renal function has remained stable at stage III chronic kidney disease.  On review of her medications, patient is also not taking any bronchodilator therapy.  I do not exactly know the reasons for this, whether this is one of the reasons for her dyspnea on exertion remains to be seen.   Adrian Prows, MD, [facility] 09/15/2019, 1:08 PM Office: [phone]  CC: Dr. Chase Caller.

## 2019-09-22 ENCOUNTER — Ambulatory Visit: Payer: Medicare Other | Admitting: Cardiology

## 2019-09-27 ENCOUNTER — Telehealth: Payer: Self-pay

## 2019-09-27 MED ORDER — ATORVASTATIN CALCIUM 20 MG PO TABS: ORAL_TABLET | ORAL | 3 refills | Status: DC

## 2019-09-27 NOTE — Telephone Encounter (Signed)
Patient called and asked if she should continue her blood thinner?

## 2019-09-27 NOTE — Telephone Encounter (Signed)
Yes for now until her next echo in 6 months and OV

## 2019-09-28 ENCOUNTER — Other Ambulatory Visit: Payer: Self-pay

## 2019-09-28 MED ORDER — RIVAROXABAN 15 MG PO TABS: 15.0000 mg | ORAL_TABLET | Freq: Every day | ORAL | 1 refills | Status: DC

## 2019-10-06 DIAGNOSIS — Z20822 Contact with and (suspected) exposure to covid-19: Secondary | ICD-10-CM | POA: Diagnosis not present

## 2019-10-17 DIAGNOSIS — Z79899 Other long term (current) drug therapy: Secondary | ICD-10-CM | POA: Diagnosis not present

## 2019-10-17 DIAGNOSIS — Z6824 Body mass index (BMI) 24.0-24.9, adult: Secondary | ICD-10-CM | POA: Diagnosis not present

## 2019-10-17 DIAGNOSIS — J849 Interstitial pulmonary disease, unspecified: Secondary | ICD-10-CM | POA: Diagnosis not present

## 2019-10-17 DIAGNOSIS — M3219 Other organ or system involvement in systemic lupus erythematosus: Secondary | ICD-10-CM | POA: Diagnosis not present

## 2019-10-17 DIAGNOSIS — M150 Primary generalized (osteo)arthritis: Secondary | ICD-10-CM | POA: Diagnosis not present

## 2019-11-29 ENCOUNTER — Other Ambulatory Visit: Payer: Self-pay

## 2019-11-29 ENCOUNTER — Ambulatory Visit
Admission: EM | Admit: 2019-11-29 | Discharge: 2019-11-29 | Disposition: A | Discharge status: Home / Self Care | Payer: Medicare HMO | Attending: Internal Medicine | Admitting: Internal Medicine

## 2019-11-29 ENCOUNTER — Encounter: Payer: Self-pay | Admitting: Emergency Medicine

## 2019-11-29 DIAGNOSIS — R630 Anorexia: Secondary | ICD-10-CM | POA: Diagnosis not present

## 2019-11-29 DIAGNOSIS — Z7689 Persons encountering health services in other specified circumstances: Secondary | ICD-10-CM | POA: Diagnosis not present

## 2019-11-29 DIAGNOSIS — R059 Cough, unspecified: Secondary | ICD-10-CM | POA: Diagnosis not present

## 2019-11-29 DIAGNOSIS — R5383 Other fatigue: Secondary | ICD-10-CM

## 2019-11-29 NOTE — ED Triage Notes (Signed)
Patient c/o headache, loss of appetite, malaise, diarrhea, and cough x 1 week.   Patient denies fever at home.   Patient states she wants COVID-19 testing.

## 2019-11-29 NOTE — Discharge Instructions (Addendum)
Recommend use your Albuterol inhaler 2 puffs every 4 to 6 hours as needed for cough. Rest. Push fluids. Recommend increase calories while you are feeling weak and ill. Stay at home. Follow-up pending COVID 19 test results and with your PCP or Pulmonologist in 4 to 5 days if not improving.

## 2019-11-29 NOTE — ED Provider Notes (Signed)
[facility]    CSN: [id number] Arrival date & time: 11/29/19  1118      History   Chief Complaint Chief Complaint  Patient presents with   Headache   Fatigue    HPI [name] is a 79 y.o. female.   79 year old female presents with fatigue, cough, and decreased appetite for over 1 week. Started with sneezing, slight congestion and headache which has improved. Now feeling more weak and tired. Had loose stools a few days ago but also have improved. Denies any fever, nausea or vomiting. Her great grandson was sick about 2 weeks ago and thinks he may have passed on his illness to her. No known exposure to COVID 19. Has been vaccinated. Has not taken any medication for symptoms. No longer using Spiriva inhaler- does use Albuterol prn but has not used it in the past few days. Other chronic health issues include HTN, hyperlipidemia, thyroid disorder, type 2 DM, chronic kidney disease, Lupus, and chronic lung disease.  Currently on Losartan, Lipitor, Amaryl, Plaquenil, Xarelato, Synthroid, Gabapentin, and Prilosec daily.   The history is provided by the patient.    Past Medical History:  Diagnosis Date   Breast cancer ([place])    Diabetes mellitus without complication ([place])    Hypertension    Hypothyroidism    Lupus ([place])     Patient Active Problem List   Diagnosis Date Noted   Acute cystitis 02/20/2019   Near syncope 02/18/2019   Hyponatremia 02/18/2019   ILD (interstitial lung disease) ([place]) 07/30/2016   Dyspnea 07/16/2016   Bibasilar crackles 07/16/2016   History of systemic lupus erythematosus (SLE) ([place]) 07/16/2016   Cough 07/16/2016   Encounter for monitoring ACE-inhibitor therapy 07/16/2016   History of asthma 07/16/2016   Fever, unspecified    Hypothyroidism 01/03/2015   HTN (hypertension) 01/03/2015   Drug-induced hypersensitivity reaction 01/03/2015   Lupus ([place]) 01/03/2015   Pyrexia 01/02/2015    Past Surgical History:    Procedure Laterality Date   BREAST LUMPECTOMY  2009    OB History   No obstetric history on file.      Home Medications    Prior to Admission medications   Medication Sig Start Date End Date Taking? Authorizing Provider  albuterol (VENTOLIN HFA) 108 (90 Base) MCG/ACT inhaler Inhale 2 puffs into the lungs every 4 (four) hours as needed for wheezing or shortness of breath. Patient not taking: Reported on 09/15/2019 07/25/19   Hall-Potvin, [place], PA-C  atorvastatin (LIPITOR) 20 MG tablet TAKE 1 TABLET(20 MG) BY MOUTH DAILY 09/27/19   Adrian Prows, MD  capsaicin (ZOSTRIX) 0.025 % cream Apply topically 3 (three) times daily as needed (for pain).  09/16/18   [provider]  gabapentin (NEURONTIN) 300 MG capsule Take 300 mg by mouth 2 (two) times daily.    [provider]  glimepiride (AMARYL) 2 MG tablet Take 2 mg by mouth daily. 02/09/19   [provider]  hydroxychloroquine (PLAQUENIL) 200 MG tablet Take 1 tablet (200 mg total) by mouth daily. 01/06/15   Kelvin Cellar, MD  levothyroxine (SYNTHROID, LEVOTHROID) 75 MCG tablet Take 75 mcg by mouth daily before breakfast.  02/13/18   [provider]  losartan (COZAAR) 50 MG tablet Take 50 mg by mouth daily.    [provider]  omeprazole (PRILOSEC) 20 MG capsule Take 20 mg by mouth daily.    [provider]  Rivaroxaban (XARELTO) 15 MG TABS tablet Take 1 tablet (15 mg total) by mouth  daily with supper. 09/28/19   Adrian Prows, MD  Tiotropium Bromide Monohydrate (SPIRIVA RESPIMAT) 1.25 MCG/ACT AERS Inhale 2 puffs into the lungs daily. Patient not taking: Reported on 09/15/2019 08/17/19 11/29/19  Brand Males, MD    Family History Family History  Problem Relation Age of Onset   Cancer Father        ? type   Asthma Brother     Social History Social History   Tobacco Use   Smoking status: Never Smoker   Smokeless tobacco: Former Systems developer    Types: Snuff  Vaping Use   Vaping Use:  Never used  Substance Use Topics   Alcohol use: No   Drug use: No     Allergies   Ciprofloxacin and Crestor [rosuvastatin]   Review of Systems Review of Systems  Constitutional: Positive for activity change, appetite change and fatigue. Negative for chills, diaphoresis and fever.  HENT: Positive for congestion (resolving), postnasal drip and sneezing. Negative for ear discharge, ear pain, mouth sores, nosebleeds, sinus pressure, sinus pain, sore throat and trouble swallowing.   Eyes: Negative for discharge and redness.  Respiratory: Positive for cough. Negative for chest tightness and wheezing.   Gastrointestinal: Positive for diarrhea. Negative for abdominal pain, nausea and vomiting.  Musculoskeletal: Positive for arthralgias and myalgias. Negative for neck pain and neck stiffness.  Skin: Negative for color change and rash.  Allergic/Immunologic: Positive for immunocompromised state. Negative for environmental allergies and food allergies.  Neurological: Positive for weakness and headaches. Negative for dizziness, tremors, seizures, syncope, facial asymmetry, speech difficulty and numbness.  Hematological: Negative for adenopathy. Bruises/bleeds easily.     Physical Exam Triage Vital Signs ED Triage Vitals  Enc Vitals Group     BP 11/29/19 1151 133/80     Pulse Rate 11/29/19 1151 72     Resp 11/29/19 1151 15     Temp 11/29/19 1151 98.2 F (36.8 C)     Temp Source 11/29/19 1151 Oral     SpO2 11/29/19 1151 96 %     Weight --      Height --      Head Circumference --      Peak Flow --      Pain Score 11/29/19 1150 0     Pain Loc --      Pain Edu? --      Excl. in [place]? --    No data found.  Updated Vital Signs BP 133/80 (BP Location: Left Arm)    Pulse 72    Temp 98.2 F (36.8 C) (Oral)    Resp 15    SpO2 96%   Visual Acuity Right Eye Distance:   Left Eye Distance:   Bilateral Distance:    Right Eye Near:   Left Eye Near:    Bilateral Near:     Physical  Exam Vitals and nursing note reviewed.  Constitutional:      General: She is awake. She is not in acute distress.    Appearance: She is well-developed and well-groomed. She is ill-appearing.     Comments: She is sitting in the exam chair in no acute distress but appears tired and ill.   HENT:     Head: Normocephalic and atraumatic.     Right Ear: Hearing, tympanic membrane, ear canal and external ear normal.     Left Ear: Hearing, tympanic membrane, ear canal and external ear normal.     Nose: Nose normal. No congestion or rhinorrhea.     Right  Sinus: No maxillary sinus tenderness or frontal sinus tenderness.     Left Sinus: No maxillary sinus tenderness or frontal sinus tenderness.     Mouth/Throat:     Lips: Pink.     Mouth: Mucous membranes are moist.     Pharynx: Uvula midline. No pharyngeal swelling, oropharyngeal exudate, posterior oropharyngeal erythema or uvula swelling.     Comments: Yellowish brown coating of tongue most likely due to chewing tobacco use.  Eyes:     Extraocular Movements: Extraocular movements intact.     Conjunctiva/sclera: Conjunctivae normal.  Cardiovascular:     Rate and Rhythm: Normal rate.  Pulmonary:     Effort: Pulmonary effort is normal. No respiratory distress.     Breath sounds: Normal air entry. No decreased air movement. Examination of the right-upper field reveals decreased breath sounds and wheezing. Examination of the left-upper field reveals decreased breath sounds and wheezing. Examination of the right-middle field reveals decreased breath sounds. Examination of the right-lower field reveals decreased breath sounds. Examination of the left-lower field reveals decreased breath sounds. Decreased breath sounds and wheezing (mild) present. No rhonchi or rales.     Comments: No crackles or coarse breath sounds on exam.  Musculoskeletal:     Cervical back: Normal range of motion and neck supple. No rigidity.  Lymphadenopathy:     Cervical: No  cervical adenopathy.  Skin:    General: Skin is warm and dry.     Capillary Refill: Capillary refill takes less than 2 seconds.     Findings: No rash.  Neurological:     General: No focal deficit present.     Mental Status: She is alert and oriented to person, place, and time.  Psychiatric:        Mood and Affect: Mood normal.        Behavior: Behavior normal. Behavior is cooperative.        Thought Content: Thought content normal.        Judgment: Judgment normal.      UC Treatments / Results  Labs (all labs ordered are listed, but only abnormal results are displayed) Labs Reviewed  NOVEL CORONAVIRUS, NAA    EKG   Radiology No results found.  Procedures Procedures (including critical care time)  Medications Ordered in UC Medications - No data to display  Initial Impression / Assessment and Plan / UC Course  I have reviewed the triage vital signs and the nursing notes.  Pertinent labs & imaging results that were available during my care of the patient were reviewed by me and considered in my medical decision making (see chart for details).    Reviewed with patient that she probably has a respiratory viral illness. Most respiratory symptoms are improving and headache has resolved but still lingering fatigue and weakness. Did not feel imaging is needed at this time since symptoms are slowly improving but may consider if symptoms persist. Encouraged to use her Albuterol inhaler 2 puffs every 4 to 6 hours as needed for cough and any shortness of breath. Push fluids. Discussed eating calorie and protein dense bland foods in small portions but frequently to help improve strength. Stay at home. Rest. Discussed that she may need to see her Pulmonologist or PCP if symptoms persist for further evaluation. If symptoms worsen, especially if unable to keep down fluids and any dyspnea worsens, go to the ER ASAP. Otherwise, follow-up pending COVID 19 test results and with her Pulmonologist  or PCP in 4 to 5 days if  not improving.  Final Clinical Impressions(s) / UC Diagnoses   Final diagnoses:  Cough  Fatigue, unspecified type  Loss of appetite     Discharge Instructions     Recommend use your Albuterol inhaler 2 puffs every 4 to 6 hours as needed for cough. Rest. Push fluids. Recommend increase calories while you are feeling weak and ill. Stay at home. Follow-up pending COVID 19 test results and with your PCP or Pulmonologist in 4 to 5 days if not improving.     ED Prescriptions    None     PDMP not reviewed this encounter.   Katy Apo, NP 11/30/19 1756

## 2019-12-01 LAB — NOVEL CORONAVIRUS, NAA: SARS-CoV-2, NAA: NOT DETECTED

## 2019-12-01 LAB — SARS-COV-2, NAA 2 DAY TAT

## 2019-12-04 DIAGNOSIS — E039 Hypothyroidism, unspecified: Secondary | ICD-10-CM | POA: Diagnosis not present

## 2019-12-04 DIAGNOSIS — G629 Polyneuropathy, unspecified: Secondary | ICD-10-CM | POA: Diagnosis not present

## 2019-12-04 DIAGNOSIS — E1165 Type 2 diabetes mellitus with hyperglycemia: Secondary | ICD-10-CM | POA: Diagnosis not present

## 2019-12-04 DIAGNOSIS — N1831 Chronic kidney disease, stage 3a: Secondary | ICD-10-CM | POA: Diagnosis not present

## 2019-12-04 DIAGNOSIS — I10 Essential (primary) hypertension: Secondary | ICD-10-CM | POA: Diagnosis not present

## 2019-12-26 ENCOUNTER — Ambulatory Visit
Admission: EM | Admit: 2019-12-26 | Discharge: 2019-12-26 | Disposition: A | Discharge status: Home / Self Care | Payer: Medicare Other | Attending: Emergency Medicine | Admitting: Emergency Medicine

## 2019-12-26 ENCOUNTER — Other Ambulatory Visit: Payer: Self-pay

## 2019-12-26 DIAGNOSIS — T162XXA Foreign body in left ear, initial encounter: Secondary | ICD-10-CM

## 2019-12-26 DIAGNOSIS — Z7689 Persons encountering health services in other specified circumstances: Secondary | ICD-10-CM | POA: Diagnosis not present

## 2019-12-26 DIAGNOSIS — J069 Acute upper respiratory infection, unspecified: Secondary | ICD-10-CM | POA: Diagnosis not present

## 2019-12-26 MED ORDER — CETIRIZINE HCL 10 MG PO TABS: 10.0000 mg | ORAL_TABLET | Freq: Every day | ORAL | 0 refills | Status: DC

## 2019-12-26 MED ORDER — FLUTICASONE PROPIONATE 50 MCG/ACT NA SUSP: 1.0000 | Freq: Every day | NASAL | 0 refills | Status: DC

## 2019-12-26 MED ORDER — BENZONATATE 100 MG PO CAPS: 100.0000 mg | ORAL_CAPSULE | Freq: Three times a day (TID) | ORAL | 0 refills | Status: DC

## 2019-12-26 MED ORDER — ALBUTEROL SULFATE HFA 108 (90 BASE) MCG/ACT IN AERS
2.0000 | INHALATION_SPRAY | RESPIRATORY_TRACT | 0 refills | Status: DC | PRN
Start: 2019-12-26 — End: 2020-04-15

## 2019-12-26 MED ORDER — AEROCHAMBER PLUS FLO-VU MEDIUM MISC: 1.0000 | Freq: Once | 0 refills | Status: AC

## 2019-12-26 NOTE — ED Triage Notes (Signed)
Pt c/p "allergies-runny nose, sneezing, itching eyes" x 3 days-SOB/CP x "months"-pt states she has been seen by pulmonologist and cardiologist for SOB c/o-pt denies cough/fever-NAD-slow steady gait

## 2019-12-26 NOTE — ED Provider Notes (Signed)
[facility]    CSN: [id number] Arrival date & time: 12/26/19  1140      History   Chief Complaint Chief Complaint  Patient presents with  . URI    HPI [name] is a 79 y.o. female  With history as below presenting for 3-day course of runny nose, sneezing, itchy eyes, chest tightness with dyspnea.  Requesting refill of inhaler.  States she is followed by up with a pulmonologist and cardiologist, who are aware of her increased dyspnea.  Work-up so far unremarkable.  Patient states breast cancer is in remission.  Past Medical History:  Diagnosis Date  . Breast cancer ([place])   . Diabetes mellitus without complication ([place])   . Hypertension   . Hypothyroidism   . Lupus [facility])     Patient Active Problem List   Diagnosis Date Noted  . Acute cystitis 02/20/2019  . Near syncope 02/18/2019  . Hyponatremia 02/18/2019  . ILD (interstitial lung disease) ([place]) 07/30/2016  . Dyspnea 07/16/2016  . Bibasilar crackles 07/16/2016  . History of systemic lupus erythematosus (SLE) ([place]) 07/16/2016  . Cough 07/16/2016  . Encounter for monitoring ACE-inhibitor therapy 07/16/2016  . History of asthma 07/16/2016  . Fever, unspecified   . Hypothyroidism 01/03/2015  . HTN (hypertension) 01/03/2015  . Drug-induced hypersensitivity reaction 01/03/2015  . Lupus ([place]) 01/03/2015  . Pyrexia 01/02/2015    Past Surgical History:  Procedure Laterality Date  . BREAST LUMPECTOMY  2009    OB History   No obstetric history on file.      Home Medications    Prior to Admission medications   Medication Sig Start Date End Date Taking? Authorizing Provider  albuterol (VENTOLIN HFA) 108 (90 Base) MCG/ACT inhaler Inhale 2 puffs into the lungs every 4 (four) hours as needed for wheezing or shortness of breath. 12/26/19   Hall-Potvin, [place], PA-C  atorvastatin (LIPITOR) 20 MG tablet TAKE 1 TABLET(20 MG) BY MOUTH DAILY 09/27/19   Adrian Prows, MD  benzonatate (TESSALON) 100 MG capsule  Take 1 capsule (100 mg total) by mouth every 8 (eight) hours. 12/26/19   Hall-Potvin, [place], PA-C  capsaicin (ZOSTRIX) 0.025 % cream Apply topically 3 (three) times daily as needed (for pain).  09/16/18   [provider]  cetirizine (ZYRTEC ALLERGY) 10 MG tablet Take 1 tablet (10 mg total) by mouth daily. 12/26/19   Hall-Potvin, [place], PA-C  fluticasone (FLONASE) 50 MCG/ACT nasal spray Place 1 spray into both nostrils daily. 12/26/19   Hall-Potvin, [place], PA-C  gabapentin (NEURONTIN) 300 MG capsule Take 300 mg by mouth 2 (two) times daily.    [provider]  glimepiride (AMARYL) 2 MG tablet Take 2 mg by mouth daily. 02/09/19   [provider]  hydroxychloroquine (PLAQUENIL) 200 MG tablet Take 1 tablet (200 mg total) by mouth daily. 01/06/15   Kelvin Cellar, MD  levothyroxine (SYNTHROID, LEVOTHROID) 75 MCG tablet Take 75 mcg by mouth daily before breakfast.  02/13/18   [provider]  losartan (COZAAR) 50 MG tablet Take 50 mg by mouth daily.    [provider]  omeprazole (PRILOSEC) 20 MG capsule Take 20 mg by mouth daily.    [provider]  Rivaroxaban (XARELTO) 15 MG TABS tablet Take 1 tablet (15 mg total) by mouth daily with supper. 09/28/19   Adrian Prows, MD  Spacer/Aero-Holding Chambers (AEROCHAMBER PLUS FLO-VU MEDIUM) MISC 1 each by Other route once for 1 dose. 12/26/19 12/26/19  Hall-Potvin, [place], PA-C  Tiotropium Bromide Monohydrate (SPIRIVA  RESPIMAT) 1.25 MCG/ACT AERS Inhale 2 puffs into the lungs daily. Patient not taking: Reported on 09/15/2019 08/17/19 11/29/19  Brand Males, MD    Family History Family History  Problem Relation Age of Onset  . Cancer Father        ? type  . Asthma Brother     Social History Social History   Tobacco Use  . Smoking status: Never Smoker  . Smokeless tobacco: Former Systems developer    Types: Snuff  Vaping Use  . Vaping Use: Never used  Substance Use Topics  . Alcohol use: No  . Drug  use: No     Allergies   Ciprofloxacin and Crestor [rosuvastatin]   Review of Systems Review of Systems  Constitutional: Negative for fatigue and fever.  HENT: Positive for congestion, rhinorrhea and sneezing. Negative for dental problem, ear pain, facial swelling, hearing loss, sinus pain, sore throat, trouble swallowing and voice change.   Eyes: Negative for photophobia, pain and visual disturbance.  Respiratory: Positive for cough, chest tightness and shortness of breath.        DOE, chronic/stable  Cardiovascular: Negative for chest pain and palpitations.  Gastrointestinal: Negative for diarrhea and vomiting.  Musculoskeletal: Negative for arthralgias and myalgias.  Neurological: Negative for dizziness and headaches.     Physical Exam Triage Vital Signs ED Triage Vitals  Enc Vitals Group     BP 12/26/19 1212 116/75     Pulse Rate 12/26/19 1212 77     Resp 12/26/19 1212 20     Temp 12/26/19 1212 98.8 F (37.1 C)     Temp Source 12/26/19 1212 Oral     SpO2 12/26/19 1212 98 %     Weight 12/26/19 1210 152 lb (68.9 kg)     Height 12/26/19 1210 5\' 7"  (1.702 m)     Head Circumference --      Peak Flow --      Pain Score 12/26/19 1210 0     Pain Loc --      Pain Edu? --      Excl. in [place]? --    No data found.  Updated Vital Signs BP 116/75 (BP Location: Left Arm)   Pulse 77   Temp 98.8 F (37.1 C) (Oral)   Resp 20   Ht 5\' 7"  (1.702 m)   Wt 152 lb (68.9 kg)   SpO2 98%   BMI 23.81 kg/m   Visual Acuity Right Eye Distance:   Left Eye Distance:   Bilateral Distance:    Right Eye Near:   Left Eye Near:    Bilateral Near:     Physical Exam Constitutional:      General: She is not in acute distress.    Appearance: She is not ill-appearing or diaphoretic.  HENT:     Head: Normocephalic and atraumatic.     Right Ear: Tympanic membrane and ear canal normal.     Left Ear: Tympanic membrane and ear canal normal.     Ears:     Comments: L ear w/ FB, removed w/  forceps, repeat exam unremarkable    Mouth/Throat:     Mouth: Mucous membranes are moist.     Pharynx: Oropharynx is clear. No oropharyngeal exudate or posterior oropharyngeal erythema.  Eyes:     General: No scleral icterus.    Conjunctiva/sclera: Conjunctivae normal.     Pupils: Pupils are equal, round, and reactive to light.  Neck:     Comments: Trachea midline, negative JVD Cardiovascular:  Rate and Rhythm: Normal rate and regular rhythm.     Heart sounds: No murmur heard.  No gallop.   Pulmonary:     Effort: Pulmonary effort is normal. No respiratory distress.     Breath sounds: No wheezing, rhonchi or rales.  Musculoskeletal:     Cervical back: Neck supple. No tenderness.  Lymphadenopathy:     Cervical: No cervical adenopathy.  Skin:    Capillary Refill: Capillary refill takes less than 2 seconds.     Coloration: Skin is not jaundiced or pale.     Findings: No rash.  Neurological:     General: No focal deficit present.     Mental Status: She is alert and oriented to person, place, and time.      UC Treatments / Results  Labs (all labs ordered are listed, but only abnormal results are displayed) Labs Reviewed  NOVEL CORONAVIRUS, NAA    EKG   Radiology No results found.  Procedures Procedures (including critical care time)  Medications Ordered in UC Medications - No data to display  Initial Impression / Assessment and Plan / UC Course  I have reviewed the triage vital signs and the nursing notes.  Pertinent labs & imaging results that were available during my care of the patient were reviewed by me and considered in my medical decision making (see chart for details).     Patient afebrile, nontoxic, with SpO2 98%.  Covid PCR pending.  Patient to quarantine until results are back.  We will treat supportively as outlined below.  Return precautions discussed, patient verbalized understanding and is agreeable to plan. Final Clinical Impressions(s) / UC  Diagnoses   Final diagnoses:  Foreign body of left ear, initial encounter  URI with cough and congestion     Discharge Instructions     Tessalon for cough. Start flonase, atrovent nasal spray for nasal congestion/drainage. You can use over the counter nasal saline rinse such as neti pot for nasal congestion. Keep hydrated, your urine should be clear to pale yellow in color. Tylenol/motrin for fever and pain. Monitor for any worsening of symptoms, chest pain, shortness of breath, wheezing, swelling of the throat, go to the emergency department for further evaluation needed.     ED Prescriptions    Medication Sig Dispense Auth. Provider   albuterol (VENTOLIN HFA) 108 (90 Base) MCG/ACT inhaler Inhale 2 puffs into the lungs every 4 (four) hours as needed for wheezing or shortness of breath. 18 g Hall-Potvin, [place], PA-C   Spacer/Aero-Holding Chambers (AEROCHAMBER PLUS FLO-VU MEDIUM) MISC 1 each by Other route once for 1 dose. 1 each Hall-Potvin, [place], PA-C   cetirizine (ZYRTEC ALLERGY) 10 MG tablet Take 1 tablet (10 mg total) by mouth daily. 30 tablet Hall-Potvin, [place], PA-C   fluticasone (FLONASE) 50 MCG/ACT nasal spray Place 1 spray into both nostrils daily. 16 g Hall-Potvin, [place], PA-C   benzonatate (TESSALON) 100 MG capsule Take 1 capsule (100 mg total) by mouth every 8 (eight) hours. 21 capsule Hall-Potvin, [place], PA-C     PDMP not reviewed this encounter.   Hall-Potvin, [place], [place] 12/26/19 1442

## 2019-12-26 NOTE — Discharge Instructions (Signed)

## 2019-12-27 LAB — SARS-COV-2, NAA 2 DAY TAT

## 2019-12-27 LAB — NOVEL CORONAVIRUS, NAA: SARS-CoV-2, NAA: DETECTED — AB

## 2019-12-28 ENCOUNTER — Telehealth: Payer: Self-pay | Admitting: Nurse Practitioner

## 2019-12-28 NOTE — Telephone Encounter (Signed)
Called to Discuss with patient about Covid symptoms and the use of the monoclonal antibody infusion for those with mild to moderate Covid symptoms and at a high risk of hospitalization.     Pt appears to qualify for this infusion due to co-morbid conditions and/or a member of an at-risk group in accordance with the FDA Emergency Use Authorization. (BMI >25, hypertension, advanced age)    Unable to reach pt. Voicemail left.   Alda Lea, NP [facility]  [phone]

## 2020-02-11 NOTE — Progress Notes (Deleted)
Primary Physician/Referring:  Benito Mccreedy, MD  Patient ID: [name], female    DOB: [birth year], 80 y.o.   MRN: [id number]  No chief complaint on file.  HPI:    [name]  is a 80 y.o. 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  She is presently on chronic Xarelto due to high risk for cardioembolic phenomena.  She follows Dr. Chase Caller for her ILD, has chronic shortness of breath that has gradually progressed, was recently evaluated by 6-minute walk test and appears to be stable.  She also underwent repeat CT scan, high-resolution chest and now presents for follow-up. No changes in her weight or leg edema. No PND or orthopnea.  ***Patient presents for urgent visit with complaints of shortness of breath. She was last seen in our office by Dr. Einar Gip 09/15/2019. Patient advised to continue anticoagulation until at least March 2022 until repeat echocardiogram.   ***Have you seen Dr. Chase Caller?   Past Medical History:  Diagnosis Date  . Breast cancer ([place])   . Diabetes mellitus without complication ([place])   . Hypertension   . Hypothyroidism   . Lupus [facility])    Past Surgical History:  Procedure Laterality Date  . BREAST LUMPECTOMY  2009   Family History  Problem Relation Age of Onset  . Cancer Father        ? type  . Asthma Brother     Social History   Tobacco Use  . Smoking status: Never Smoker  . Smokeless tobacco: Former Systems developer    Types: Snuff  Substance Use Topics  . Alcohol use: No   Marital Status: Single  ROS  Review of Systems  Cardiovascular: Positive for  dyspnea on exertion. Negative for chest pain and leg swelling.  Musculoskeletal: Positive for arthritis and back pain.  Gastrointestinal: Negative for melena.   Objective  There were no vitals taken for this visit.  Vitals with BMI 12/26/2019 11/29/2019 09/15/2019  Height 5\' 7"  - 5\' 7"   Weight 152 lbs - 154 lbs  BMI [id number]  Systolic [id number]  Diastolic 75 80 69  Pulse 77 72 73     Physical Exam Cardiovascular:     Rate and Rhythm: Normal rate and regular rhythm.     Pulses: Intact distal pulses.     Heart sounds: Normal heart sounds. No murmur heard. No gallop.      Comments: No leg edema, no JVD. Pulmonary:     Effort: Pulmonary effort is normal.     Breath sounds: Rales (coarse bilateral basal leathery crackles) present.  Abdominal:     General: Bowel sounds are normal.     Palpations: Abdomen is soft.    Laboratory examination:   Recent Labs    02/17/19 2117 02/18/19 0233 02/20/19 0248  NA 121* 126* 131*  K 3.6 4.4 4.5  CL 88* 94* 97*  CO2 23 22 24   GLUCOSE 63* 50* 121*  BUN 13 13 13   CREATININE 1.34* 1.23* 1.13*  CALCIUM 9.1 9.2 9.4  GFRNONAA 38* 43* 47*  GFRAA 44* 49* 55*   CrCl cannot be calculated (Patient's most recent lab result is older than the maximum  21 days allowed.).  CMP Latest Ref Rng & Units 02/20/2019 02/18/2019 02/17/2019  Glucose 70 - 99 mg/dL 121(H) 50(L) 63(L)  BUN 8 - 23 mg/dL 13 13 13   Creatinine 0.44 - 1.00 mg/dL 1.13(H) 1.23(H) 1.34(H)  Sodium 135 - 145 mmol/L 131(L) 126(L) 121(L)  Potassium 3.5 - 5.1 mmol/L 4.5 4.4 3.6  Chloride 98 - 111 mmol/L 97(L) 94(L) 88(L)  CO2 22 - 32 mmol/L 24 22 23   Calcium 8.9 - 10.3 mg/dL 9.4 9.2 9.1  Total Protein 6.5 - 8.1 g/dL 7.8 8.0 -  Total Bilirubin 0.3 - 1.2 mg/dL 0.5 0.3 -  Alkaline Phos 38 - 126 U/L 50 57 -  AST 15 - 41 U/L 23 27 -  ALT 0 - 44 U/L 18 19 -   CBC Latest Ref Rng & Units 02/20/2019 02/18/2019 02/17/2019  WBC 4.0 - 10.5 K/uL 4.8 5.7 5.7  Hemoglobin 12.0 - 15.0 g/dL 11.3(L)  12.2 12.7  Hematocrit 36.0 - 46.0 % 33.3(L) 36.8 37.5  Platelets 150 - 400 K/uL 212 225 238   Lipid Panel Recent Labs    02/20/19 0814  CHOL 152  TRIG 134  LDLCALC 88  VLDL 27  HDL 37*  CHOLHDL 4.1     HEMOGLOBIN A1C Lab Results  Component Value Date   HGBA1C 6.6 (H) 01/03/2015   MPG 143 01/03/2015   TSH Recent Labs    02/18/19 0113  TSH 4.495   Medications and allergies   Allergies  Allergen Reactions  . Ciprofloxacin Itching, Swelling and Other (See Comments)    Possibly causing tremors?  . Crestor [Rosuvastatin] Other (See Comments)    Myalgia and back pain     Outpatient Medications Prior to Visit  Medication Sig Dispense Refill  . albuterol (VENTOLIN HFA) 108 (90 Base) MCG/ACT inhaler Inhale 2 puffs into the lungs every 4 (four) hours as needed for wheezing or shortness of breath. 18 g 0  . atorvastatin (LIPITOR) 20 MG tablet TAKE 1 TABLET(20 MG) BY MOUTH DAILY 90 tablet 3  . benzonatate (TESSALON) 100 MG capsule Take 1 capsule (100 mg total) by mouth every 8 (eight) hours. 21 capsule 0  . capsaicin (ZOSTRIX) 0.025 % cream Apply topically 3 (three) times daily as needed (for pain).     . cetirizine (ZYRTEC ALLERGY) 10 MG tablet Take 1 tablet (10 mg total) by mouth daily. 30 tablet 0  . fluticasone (FLONASE) 50 MCG/ACT nasal spray Place 1 spray into both nostrils daily. 16 g 0  . gabapentin (NEURONTIN) 300 MG capsule Take 300 mg by mouth 2 (two) times daily.    Marland Kitchen glimepiride (AMARYL) 2 MG tablet Take 2 mg by mouth daily.    . hydroxychloroquine (PLAQUENIL) 200 MG tablet Take 1 tablet (200 mg total) by mouth daily. 30 tablet 0  . levothyroxine (SYNTHROID, LEVOTHROID) 75 MCG tablet Take 75 mcg by mouth daily before breakfast.     . losartan (COZAAR) 50 MG tablet Take 50 mg by mouth daily.    Marland Kitchen omeprazole (PRILOSEC) 20 MG capsule Take 20 mg by mouth daily.    . Rivaroxaban (XARELTO) 15 MG TABS tablet Take 1 tablet (15 mg total) by mouth daily with supper. 180  tablet 1   No facility-administered medications prior to visit.     Radiology:   CT head without contrast and CT cervical spine without contrast 02/18/2019: 1. No acute intracranial pathology. 2. No acute/traumatic cervical spine pathology. Multilevel degenerative changes most prominent at C4-C5.  HRCT chest 07/29/2019:  1. The appearance of the lungs is compatible with interstitial lung disease, with a spectrum of findings considered diagnostic of usual interstitial pneumonia (UIP) per current ATS guidelines. No significant progression of disease compared to the prior  examination. 2. Aortic atherosclerosis, in addition to left main and 3 vessel coronary artery disease. Assessment for potential risk factor modification, dietary therapy or pharmacologic therapy may be warranted, if clinically indicated. 3. Aortic Atherosclerosis (ICD10-I70.0).  Cardiac Studies:   Lexiscan myoview stress test 07/03/2016: 1. The resting electrocardiogram demonstrated normal sinus rhythm, normal resting conduction, PVC and normal rest repolarization. Stress EKG is non-diagnostic for ischemia as it a pharmacologic stress using Lexiscan. Stress symptoms included dyspnea. 2. Myocardial perfusion imaging is normal. Overall left ventricular systolic function was normal without regional wall motion abnormalities. The left ventricular ejection fraction was 75%.  Carotid artery duplex  02/18/2019: Right Carotid: Velocities in the right ICA are consistent with a 1-39% stenosis. Left Carotid: Velocities in the left ICA are consistent with a 1-39% stenosis. Vertebrals: Bilateral vertebral arteries demonstrate antegrade flow. Subclavians: Normal flow hemodynamics were seen in bilateral subclavian arteries.  Echocardiogram 09/01/2019:  Normal LV systolic function with visual EF 60-65%. Left ventricle cavity  is normal in size. Mild left ventricular hypertrophy. Normal global wall  motion. Calculated EF 58%.  At  the tip of the anterior mitral leaflet illustrates a calcified  echodensity measuring 0.27 x 0.39cm. Differential diagnosis may include  healed vegetation or libman sacks endocarditis in setting of lupus. Mild  (Grade I) mitral regurgitation.  Mild tricuspid regurgitation. No evidence of pulmonary hypertension.  Mild to moderate pulmonic regurgitation.  Compared to previous study 02/18/2019 no significant change except the  calcified echodensity now measures smaller.  Clinical correlation required, if warranted may consider transesophageal  echocardiogram.  EKG   EKG 09/15/2019: Normal sinus rhythm at rate of 75 bpm, left atrial enlargement, left axis deviation.  Poor R wave progression, cannot exclude anteroseptal infarct old.  PACs (2).  Nonspecific T abnormality.    EKG 02/17/2019: Normal sinus rhythm at 61 bpm, normal axis, PRWP cannot exclude anterior infarct old. Nonspecific T wave abnormality.  Assessment     ICD-10-CM   1. [place] endocarditis  I38   2. Dyspnea on exertion  R06.00   3. ILD (interstitial lung disease) (HCC)  J84.9     There are no discontinued medications.  No orders of the defined types were placed in this encounter.   Recommendations:   [name] is a 80 y.o. 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  She is presently on chronic Xarelto due to high risk for cardioembolic phenomena.  Hypercoagulable workup was negative.  Gradually worsening dyspnea is probably related to underlying deconditioning and patient has not been physically active.  Do not suspect  progression of coronary disease although it cannot be excluded completely.  Blood pressure is well controlled, lipids are fairly well controlled, she is on statins and tolerating Lipitor.  Also recent echocardiogram and also high-resolution CT scan does not show any evidence of pulmonary hypertension.  Also was evaluated by Dr. Marchelle Gearing and felt that her 6-minute walk test was fairly stable without hypoxemia.  Hence we will continue present medications, I will see her back in 6 months with repeat echocardiogram to evaluate Libman-Sacks endocarditis lesions on the mitral valve.  If lesions are completely resolved, I could consider discontinuing Xarelto.  Fortunately she has not had any bleeding diathesis.  Her daughter is present on the phone and all questions answered.  Renal function has remained stable at stage III chronic kidney disease.  On review of her medications, patient is also not taking any bronchodilator therapy.  I do not exactly know the reasons for this, whether this is one of the reasons for her dyspnea on exertion remains to be seen.  *** ***

## 2020-02-13 ENCOUNTER — Other Ambulatory Visit: Payer: Self-pay

## 2020-02-13 ENCOUNTER — Telehealth: Payer: Medicare Other | Admitting: Student

## 2020-02-13 DIAGNOSIS — I38 Endocarditis, valve unspecified: Secondary | ICD-10-CM

## 2020-02-13 DIAGNOSIS — R0600 Dyspnea, unspecified: Secondary | ICD-10-CM

## 2020-02-13 DIAGNOSIS — J849 Interstitial pulmonary disease, unspecified: Secondary | ICD-10-CM

## 2020-02-26 ENCOUNTER — Other Ambulatory Visit: Payer: Self-pay | Admitting: Internal Medicine

## 2020-02-27 DIAGNOSIS — Z131 Encounter for screening for diabetes mellitus: Secondary | ICD-10-CM | POA: Diagnosis not present

## 2020-02-27 DIAGNOSIS — I2584 Coronary atherosclerosis due to calcified coronary lesion: Secondary | ICD-10-CM | POA: Diagnosis not present

## 2020-02-27 DIAGNOSIS — Z136 Encounter for screening for cardiovascular disorders: Secondary | ICD-10-CM | POA: Diagnosis not present

## 2020-02-27 DIAGNOSIS — E1165 Type 2 diabetes mellitus with hyperglycemia: Secondary | ICD-10-CM | POA: Diagnosis not present

## 2020-02-27 DIAGNOSIS — E039 Hypothyroidism, unspecified: Secondary | ICD-10-CM | POA: Diagnosis not present

## 2020-02-27 DIAGNOSIS — I10 Essential (primary) hypertension: Secondary | ICD-10-CM | POA: Diagnosis not present

## 2020-02-27 DIAGNOSIS — K219 Gastro-esophageal reflux disease without esophagitis: Secondary | ICD-10-CM | POA: Diagnosis not present

## 2020-02-27 DIAGNOSIS — N1831 Chronic kidney disease, stage 3a: Secondary | ICD-10-CM | POA: Diagnosis not present

## 2020-02-27 DIAGNOSIS — Z0001 Encounter for general adult medical examination with abnormal findings: Secondary | ICD-10-CM | POA: Diagnosis not present

## 2020-02-27 DIAGNOSIS — J432 Centrilobular emphysema: Secondary | ICD-10-CM | POA: Diagnosis not present

## 2020-03-08 DIAGNOSIS — J8410 Pulmonary fibrosis, unspecified: Secondary | ICD-10-CM | POA: Diagnosis not present

## 2020-03-08 DIAGNOSIS — F17210 Nicotine dependence, cigarettes, uncomplicated: Secondary | ICD-10-CM | POA: Diagnosis not present

## 2020-03-08 DIAGNOSIS — J849 Interstitial pulmonary disease, unspecified: Secondary | ICD-10-CM | POA: Diagnosis not present

## 2020-03-08 DIAGNOSIS — E1142 Type 2 diabetes mellitus with diabetic polyneuropathy: Secondary | ICD-10-CM | POA: Diagnosis not present

## 2020-03-08 DIAGNOSIS — E871 Hypo-osmolality and hyponatremia: Secondary | ICD-10-CM | POA: Diagnosis not present

## 2020-03-08 DIAGNOSIS — Z23 Encounter for immunization: Secondary | ICD-10-CM | POA: Diagnosis not present

## 2020-03-08 DIAGNOSIS — Z1159 Encounter for screening for other viral diseases: Secondary | ICD-10-CM | POA: Diagnosis not present

## 2020-03-08 DIAGNOSIS — E039 Hypothyroidism, unspecified: Secondary | ICD-10-CM | POA: Diagnosis not present

## 2020-03-08 DIAGNOSIS — D689 Coagulation defect, unspecified: Secondary | ICD-10-CM | POA: Diagnosis not present

## 2020-03-08 DIAGNOSIS — E785 Hyperlipidemia, unspecified: Secondary | ICD-10-CM | POA: Diagnosis not present

## 2020-03-08 DIAGNOSIS — D849 Immunodeficiency, unspecified: Secondary | ICD-10-CM | POA: Diagnosis not present

## 2020-03-08 DIAGNOSIS — I10 Essential (primary) hypertension: Secondary | ICD-10-CM | POA: Diagnosis not present

## 2020-03-12 ENCOUNTER — Other Ambulatory Visit: Payer: Medicare Other

## 2020-03-18 ENCOUNTER — Ambulatory Visit: Payer: Medicare Other | Admitting: Cardiology

## 2020-03-18 DIAGNOSIS — E7800 Pure hypercholesterolemia, unspecified: Secondary | ICD-10-CM | POA: Diagnosis not present

## 2020-03-18 DIAGNOSIS — K219 Gastro-esophageal reflux disease without esophagitis: Secondary | ICD-10-CM | POA: Diagnosis not present

## 2020-03-18 DIAGNOSIS — Z23 Encounter for immunization: Secondary | ICD-10-CM | POA: Diagnosis not present

## 2020-03-18 DIAGNOSIS — Z0000 Encounter for general adult medical examination without abnormal findings: Secondary | ICD-10-CM | POA: Diagnosis not present

## 2020-03-18 DIAGNOSIS — E1169 Type 2 diabetes mellitus with other specified complication: Secondary | ICD-10-CM | POA: Diagnosis not present

## 2020-03-18 DIAGNOSIS — M3213 Lung involvement in systemic lupus erythematosus: Secondary | ICD-10-CM | POA: Diagnosis not present

## 2020-03-18 DIAGNOSIS — E1141 Type 2 diabetes mellitus with diabetic mononeuropathy: Secondary | ICD-10-CM | POA: Diagnosis not present

## 2020-03-18 DIAGNOSIS — Z1389 Encounter for screening for other disorder: Secondary | ICD-10-CM | POA: Diagnosis not present

## 2020-03-21 ENCOUNTER — Ambulatory Visit: Payer: Medicare Other

## 2020-03-21 ENCOUNTER — Other Ambulatory Visit: Payer: Self-pay

## 2020-03-21 DIAGNOSIS — R0609 Other forms of dyspnea: Secondary | ICD-10-CM | POA: Diagnosis not present

## 2020-03-21 DIAGNOSIS — I38 Endocarditis, valve unspecified: Secondary | ICD-10-CM | POA: Diagnosis not present

## 2020-03-21 DIAGNOSIS — R0600 Dyspnea, unspecified: Secondary | ICD-10-CM

## 2020-03-25 ENCOUNTER — Telehealth: Payer: Self-pay | Admitting: Internal Medicine

## 2020-03-25 NOTE — Telephone Encounter (Signed)
Sle/ild. Laset seen July 2022 with advise for follwoup in 9 months . No PFT needed  Plan  -Give follow-up in April 20 16 Jun 2020 -30-minute slot.  Needs symptom questionnaire and simple walk test at follow-up

## 2020-03-26 ENCOUNTER — Ambulatory Visit: Payer: Medicare Other | Admitting: Cardiology

## 2020-03-28 NOTE — Telephone Encounter (Signed)
Called and spoke with pt letting her know the info per MR that he wants [place] to schedule her a f/u with him. Pt verbalized understanding. appt scheduled for pt with MR 4/12. Nothing further needed.

## 2020-04-15 ENCOUNTER — Other Ambulatory Visit: Payer: Self-pay

## 2020-04-15 ENCOUNTER — Ambulatory Visit: Payer: Medicare Other | Admitting: Cardiology

## 2020-04-15 ENCOUNTER — Encounter: Payer: Self-pay | Admitting: Cardiology

## 2020-04-15 VITALS — BP 139/82 | HR 72 | Temp 97.6°F | Resp 17 | Ht 67.0 in | Wt 157.8 lb

## 2020-04-15 DIAGNOSIS — M329 Systemic lupus erythematosus, unspecified: Secondary | ICD-10-CM

## 2020-04-15 DIAGNOSIS — I209 Angina pectoris, unspecified: Secondary | ICD-10-CM

## 2020-04-15 DIAGNOSIS — J849 Interstitial pulmonary disease, unspecified: Secondary | ICD-10-CM

## 2020-04-15 DIAGNOSIS — R0600 Dyspnea, unspecified: Secondary | ICD-10-CM

## 2020-04-15 DIAGNOSIS — R0609 Other forms of dyspnea: Secondary | ICD-10-CM

## 2020-04-15 DIAGNOSIS — I38 Endocarditis, valve unspecified: Secondary | ICD-10-CM | POA: Diagnosis not present

## 2020-04-15 MED ORDER — ALBUTEROL SULFATE HFA 108 (90 BASE) MCG/ACT IN AERS: 2.0000 | INHALATION_SPRAY | RESPIRATORY_TRACT | 3 refills | Status: DC | PRN

## 2020-04-15 NOTE — Progress Notes (Signed)
Primary Physician/Referring:  Carol Ada, MD  Patient ID: [name], female    DOB: [birth year], 80 y.o.   MRN: [id number]  Chief Complaint  Patient presents with  . Endocarditis  . Hyperlipidemia  . Follow-up    6 MONTH   HPI:    [name]  is a 80 y.o. 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  She is presently on chronic Xarelto due to high risk for cardioembolic phenomena.  She follows Dr. Chase Caller for her ILD, has chronic shortness of breath that has gradually progressed however has been stable over the past 6 months.  She states that with exertional activity she also gets chest tightness but on questioning states that this been ongoing for a very long time.  This is a new symptom that she endorses today.  No changes in her weight or leg edema. No PND or orthopnea.  Past Medical History:  Diagnosis Date  . Breast cancer ([place])   . Diabetes mellitus without complication ([place])   . Hypertension   . Hypothyroidism   . Lupus [facility])    Past Surgical History:  Procedure Laterality Date  . BREAST LUMPECTOMY  2009   Family History  Problem Relation Age of Onset  . Cancer Father        ? type  . Asthma Brother     Social History   Tobacco Use  . Smoking status: Never Smoker  . Smokeless tobacco: Former Systems developer    Types: Snuff  Substance Use Topics  . Alcohol use: No   Marital Status: Single  ROS  Review of Systems  Cardiovascular: Positive for chest pain and dyspnea on exertion. Negative for leg swelling.  Respiratory: Positive for wheezing.   Musculoskeletal:  Positive for arthritis and back pain.  Gastrointestinal: Negative for melena.   Objective  Blood pressure 139/82, pulse 72, temperature 97.6 F (36.4 C), temperature source Temporal, resp. rate 17, height 5\' 7"  (1.702 m), weight 157 lb 12.8 oz (71.6 kg), SpO2 98 %.  Vitals with BMI 04/15/2020 12/26/2019 11/29/2019  Height 5\' 7"  5\' 7"  -  Weight 157 lbs 13 oz 152 lbs -  BMI 51.02 58.5 -  Systolic 277 824 235  Diastolic 82 75 80  Pulse 72 77 72     Physical Exam Cardiovascular:     Rate and Rhythm: Normal rate and regular rhythm.     Pulses: Intact distal pulses.     Heart sounds: Normal heart sounds. No murmur heard. No gallop.      Comments: No leg edema, no JVD. Pulmonary:     Effort: Pulmonary effort is normal.     Breath sounds: Rales (coarse bilateral basal leathery crackles) present.  Abdominal:     General: Bowel sounds are normal.     Palpations: Abdomen is soft.  Skin:    Capillary Refill: Capillary refill takes less than 2 seconds.  Neurological:     Mental Status: [name], place, and time.  Psychiatric:        Mood and Affect: Mood normal.    Laboratory examination:   No results for input(s): NA, K, CL, CO2, GLUCOSE, BUN, CREATININE,  CALCIUM, GFRNONAA, GFRAA in the last 8760 hours. CrCl cannot be calculated (Patient's most recent lab result is older than the maximum 21 days allowed.).  CMP Latest Ref Rng & Units 02/20/2019 02/18/2019 02/17/2019  Glucose 70 - 99 mg/dL 121(H) 50(L) 63(L)  BUN 8 - 23 mg/dL 13 13 13   Creatinine 0.44 - 1.00 mg/dL 1.13(H) 1.23(H) 1.34(H)  Sodium 135 - 145 mmol/L 131(L) 126(L) 121(L)  Potassium 3.5 - 5.1 mmol/L 4.5 4.4 3.6  Chloride 98 - 111 mmol/L 97(L) 94(L) 88(L)  CO2 22 - 32 mmol/L 24 22 23   Calcium 8.9 - 10.3 mg/dL 9.4 9.2 9.1  Total Protein 6.5 - 8.1 g/dL 7.8 8.0 -  Total Bilirubin 0.3 - 1.2 mg/dL 0.5 0.3 -  Alkaline Phos 38 - 126 U/L 50 57 -  AST 15 - 41 U/L 23 27 -  ALT 0 - 44 U/L 18 19 -   CBC Latest Ref  Rng & Units 02/20/2019 02/18/2019 02/17/2019  WBC 4.0 - 10.5 K/uL 4.8 5.7 5.7  Hemoglobin 12.0 - 15.0 g/dL 11.3(L) 12.2 12.7  Hematocrit 36.0 - 46.0 % 33.3(L) 36.8 37.5  Platelets 150 - 400 K/uL 212 225 238   Lipid Panel No results for input(s): CHOL, TRIG, LDLCALC, VLDL, HDL, CHOLHDL, LDLDIRECT in the last 8760 hours.   HEMOGLOBIN A1C Lab Results  Component Value Date   HGBA1C 6.6 (H) 01/03/2015   MPG 143 01/03/2015   TSH No results for input(s): TSH in the last 8760 hours. Medications and allergies   Allergies  Allergen Reactions  . Ciprofloxacin Itching, Swelling and Other (See Comments)    Possibly causing tremors?  . Crestor [Rosuvastatin] Other (See Comments)    Myalgia and back pain     Outpatient Medications Prior to Visit  Medication Sig Dispense Refill  . atorvastatin (LIPITOR) 20 MG tablet TAKE 1 TABLET(20 MG) BY MOUTH DAILY 90 tablet 3  . capsaicin (ZOSTRIX) 0.025 % cream Apply topically 3 (three) times daily as needed (for pain).     . cetirizine (ZYRTEC ALLERGY) 10 MG tablet Take 1 tablet (10 mg total) by mouth daily. 30 tablet 0  . fluticasone (FLONASE) 50 MCG/ACT nasal spray Place 1 spray into both nostrils daily. 16 g 0  . gabapentin (NEURONTIN) 300 MG capsule Take 300 mg by mouth 2 (two) times daily.    Marland Kitchen glimepiride (AMARYL) 2 MG tablet Take 2 mg by mouth daily.    . hydroxychloroquine (PLAQUENIL) 200 MG tablet Take 1 tablet (200 mg total) by mouth daily. 30 tablet 0  . levothyroxine (SYNTHROID, LEVOTHROID) 75 MCG tablet Take 75 mcg by mouth daily before breakfast.     . losartan (COZAAR) 50 MG tablet Take 50 mg by mouth daily.    Marland Kitchen omeprazole (PRILOSEC) 20 MG capsule Take 20 mg by mouth daily.    . Rivaroxaban (XARELTO) 15 MG TABS tablet Take 1 tablet (15 mg total) by mouth daily with supper. 180 tablet 1  . albuterol (VENTOLIN HFA) 108 (90 Base) MCG/ACT inhaler Inhale 2 puffs into the lungs every 4 (four) hours as needed for wheezing or shortness of breath.  18 g 0  . losartan-hydrochlorothiazide (HYZAAR) 50-12.5 MG tablet Take 1 tablet by mouth daily.    . benzonatate (TESSALON) 100 MG capsule Take 1 capsule (100 mg total) by mouth every 8 (eight) hours. 21 capsule 0   No facility-administered medications prior to visit.     Radiology:   CT head without contrast and CT cervical spine without  contrast 02/18/2019: 1. No acute intracranial pathology. 2. No acute/traumatic cervical spine pathology. Multilevel degenerative changes most prominent at C4-C5.  HRCT chest 07/29/2019: 1. The appearance of the lungs is compatible with interstitial lung disease, with a spectrum of findings considered diagnostic of usual interstitial pneumonia (UIP) per current ATS guidelines. No significant progression of disease compared to the prior  examination. 2. Aortic atherosclerosis, in addition to left main and 3 vessel coronary artery disease. Assessment for potential risk factor modification, dietary therapy or pharmacologic therapy may be warranted, if clinically indicated. 3. Aortic Atherosclerosis (ICD10-I70.0).  Cardiac Studies:   Lexiscan myoview stress test 07/03/2016: 1. The resting electrocardiogram demonstrated normal sinus rhythm, normal resting conduction, PVC and normal rest repolarization. Stress EKG is non-diagnostic for ischemia as it a pharmacologic stress using Lexiscan. Stress symptoms included dyspnea. 2. Myocardial perfusion imaging is normal. Overall left ventricular systolic function was normal without regional wall motion abnormalities. The left ventricular ejection fraction was 75%.  Carotid artery duplex  02/18/2019: Right Carotid: Velocities in the right ICA are consistent with a 1-39% stenosis. Left Carotid: Velocities in the left ICA are consistent with a 1-39% stenosis. Vertebrals: Bilateral vertebral arteries demonstrate antegrade flow. Subclavians: Normal flow hemodynamics were seen in bilateral subclavian  arteries.  Echocardiogram 03/21/2020: Left ventricle cavity is normal in size. Moderate concentric hypertrophy of the left ventricle. Normal global wall motion. Normal LV systolic function with EF 66%. Doppler evidence of grade I (impaired) diastolic dysfunction, normal LAP.  Trileaflet aortic valve.  Trace aortic regurgitation. Focal echodensity at the tip of anterior mitral leaflet could represent calcification or healed vegetation. Mild (Grade I) mitral regurgitation. Mild tricuspid regurgitation.  Mild pulmonic regurgitation. No evidence of pulmonary hypertension. No significant change compared to previous study on 09/01/2019  EKG  EKG 04/15/2020: Normal sinus rhythm at rate of 64 bpm, left atrial enlargement, left axis deviation, left anterior fascicular block.  Poor R wave progression, cannot exclude anteroseptal infarct old.  Nonspecific T abnormality.  No significant change from prior EKG.  EKG 09/15/2019: Normal sinus rhythm at rate of 75 bpm, left atrial enlargement, left axis deviation.  Poor R wave progression, cannot exclude anteroseptal infarct old.  PACs (2).  Nonspecific T abnormality.    EKG 02/17/2019: Normal sinus rhythm at 61 bpm, normal axis, PRWP cannot exclude anterior infarct old. Nonspecific T wave abnormality.  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 SLE (systemic lupus erythematosus related syndrome) (HCC)  M32.9   3. Dyspnea on exertion  R06.00   4. ILD (interstitial lung disease) (HCC)  J84.9 albuterol (VENTOLIN HFA) 108 (90 Base) MCG/ACT inhaler  5. Angina pectoris ([place])  I20.9     Medications Discontinued During This Encounter  Medication Reason  . benzonatate (TESSALON) 100 MG capsule Error  . albuterol (VENTOLIN HFA) 108 (90 Base) MCG/ACT inhaler Reorder   Meds ordered this encounter  Medications  . albuterol (VENTOLIN HFA) 108 (90 Base) MCG/ACT inhaler    Sig: Inhale 2 puffs into the lungs every 4 (four) hours as  needed for wheezing or shortness of breath.    Dispense:  18 g    Refill:  3   Recommendations:   [name] is a 80 y.o. 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  She is presently on chronic Xarelto due to high risk for cardioembolic phenomena.  Hypercoagulable workup was negative.  I refilled her prescription for albuterol MDI as she was out of them.  Dyspnea has remained stable compared to 6 months ago, she still also continues to have exertional chest pain only when she climbs or walks uphill, although this history was not given to me previously, states that has been going for quite a while.  All the symptoms have been fairly stable.  Patient is on a statin, she is also on anticoagulation, hence even though she may have anginal symptoms as long as her symptoms are stable, continue medical therapy.  She has had a negative nuclear stress test in 2019.  I would like to see her back in 3 months to discuss long-term anticoagulation with regard to marantic endocarditis.  I will discuss with Dr. Gavin Pound who is her rheumatologist to see whether long-term anticoagulation is needed or we can come off of anticoagulation as the marantic endocarditis lesions appear to be almost gone on her mitral valve leaflet.    I would have a low threshold to perform right and left heart catheterization if her symptoms are getting worse.   Addendum: I was able to get in touch with Dr. Gavin Pound and patient's SLE appears to be very quiet and labs have been very stable.  Dr. Trudie Reed will reassess her tomorrow  as patient has an appointment with her, labs will also be obtained.  Depending upon this we may consider discontinuing long-term anticoagulation.  Further research needed with regard to long-term anticoagulation versus discontinuing anticoagulation as valvular abnormality has clearly improved significantly.    Adrian Prows, MD, [facility] 04/15/2020, 12:16 PM Office: [phone]  CC: Dr. Chase Caller. CC: Dr. Gavin Pound

## 2020-04-16 DIAGNOSIS — I339 Acute and subacute endocarditis, unspecified: Secondary | ICD-10-CM | POA: Diagnosis not present

## 2020-04-16 DIAGNOSIS — J849 Interstitial pulmonary disease, unspecified: Secondary | ICD-10-CM | POA: Diagnosis not present

## 2020-04-16 DIAGNOSIS — Z6824 Body mass index (BMI) 24.0-24.9, adult: Secondary | ICD-10-CM | POA: Diagnosis not present

## 2020-04-16 DIAGNOSIS — M150 Primary generalized (osteo)arthritis: Secondary | ICD-10-CM | POA: Diagnosis not present

## 2020-04-16 DIAGNOSIS — M3219 Other organ or system involvement in systemic lupus erythematosus: Secondary | ICD-10-CM | POA: Diagnosis not present

## 2020-05-07 ENCOUNTER — Encounter: Payer: Self-pay | Admitting: Internal Medicine

## 2020-05-07 ENCOUNTER — Ambulatory Visit (INDEPENDENT_AMBULATORY_CARE_PROVIDER_SITE_OTHER): Payer: Medicare Other | Admitting: Internal Medicine

## 2020-05-07 ENCOUNTER — Other Ambulatory Visit: Payer: Self-pay

## 2020-05-07 VITALS — BP 122/70 | HR 67 | Temp 97.5°F | Ht 67.0 in | Wt 156.8 lb

## 2020-05-07 DIAGNOSIS — R0609 Other forms of dyspnea: Secondary | ICD-10-CM

## 2020-05-07 DIAGNOSIS — M359 Systemic involvement of connective tissue, unspecified: Secondary | ICD-10-CM | POA: Diagnosis not present

## 2020-05-07 DIAGNOSIS — R0989 Other specified symptoms and signs involving the circulatory and respiratory systems: Secondary | ICD-10-CM

## 2020-05-07 DIAGNOSIS — J849 Interstitial pulmonary disease, unspecified: Secondary | ICD-10-CM | POA: Diagnosis not present

## 2020-05-07 DIAGNOSIS — R0600 Dyspnea, unspecified: Secondary | ICD-10-CM | POA: Diagnosis not present

## 2020-05-07 NOTE — Patient Instructions (Addendum)
ICD-10-CM   1. Dyspnea on exertion  R06.00   2. ILD (interstitial lung disease) ([place])  J84.9   3. 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 Pulmonary air trapping  R09.89     Shortness of breath there is partly because of your pulmonary fibrosis otherwise call interstitial lung disease. Also, stiff heart muscle that is mild and stable  The good news is that   - based on CT scan between January 2020 in July 2021 and symptoms/walk between July 2021 and April 2022 the pulmonary fibrosis is not worse  In fact you do not need oxygen at this point in time  Too bad spiriva did not help but glad albuterola as needed helps  Plan -No indication for prednisone or antifibrotic therapy for pulmonary fibrosis -No indication for oxygen at this point in time - attempt spirometry and dlco in 6 months  - do HRCTsupine and prone in 6 months    Follow-up -6 months and a 30-minute slot to interstitial lung disease clinic  -Symptom questionnaire and simple walk test at follow-up

## 2020-05-07 NOTE — Progress Notes (Signed)
PCP Benito Mccreedy, MD  HPI  IOV 07/16/2016  Chief Complaint  Patient presents with  . Shortness of Breath    consult for sob referring doctor is Dr. Einar Gip patient states that she is very sob and using the inhaler helps her very much     According to the referral chart from Dr. Einar Gip, he'll originally saw her on 06/26/2016 for shortness of breath at follow-up. Patient reported to him that she's had insidious onset of shortness of breath for several years with progression in the last several months associated with bilateral chest tightness. Apparently patient was given a diagnosis of asthma and bronchodilator use does help her. Per the patient history spirometry testing outside was abnormal. Dyspnea is significant enough patient not able to do her household work. There is associated fatigue. A trial of diuresis by the primary care physician did help the shortness of breath. Patient's symptoms are associated with lisinopril intake for hypertension.  exhaled nitric oxide test today in the office is 19 ppb and normal.  IgE. ''Walking desaturation test on 07/16/2016 185 feet x 3 laps on room air forehead probe:  did NOT desaturate. Rest pulse ox was 100%, final pulse ox was 100%. HR response 75/min at rest to 85/min at peak exertion. 2-D echocardiogram 02/21/2016 as reviewed from the outside chart shows mild concentric left ventricular remodeling with ejection fraction 79. Normal right ventricle size and contractility. No evidence of diastolic dysfunction. No pericardial effusion    In talking to the patient and her daughter [name] the above information was confirmed. In addition she is on Advair discus for the last few months because of the above problems. It is helping her. She takes it as needed which ends up being every other day once. Today the exhaled nitric oxide was normal 16 ppb which is essentially on Advair. She is also on lisinopril for the last few years. She does have associated  mild cough. Evaluation by cardiology did show crackles in the concern is he has interstitial lung disease therefore she is referred here. Associated with concern of interstitial lung disease is a history of lupus that has been present for many years. When she relocated to [facility] few years ago with a primary care physician has been following this. Patient is on plaquenil but not steroids The family is unsure about SLE blood work  Results for LILLA, CALLEJO (MRN [id number]) as of 07/16/2016 10:45  Ref. Range 01/05/2015 09:31 01/12/2015 16:07 01/12/2015 19:45 01/12/2015 20:00 01/12/2015 20:02  Hemoglobin Latest Ref Range: 12.0 - 15.0 g/dL 10.4 (L) 11.9 (L)        OV 07/30/2016  Chief Complaint  Patient presents with  . Follow-up    Pt was here for a PFT but was unable to complete it. Pt continues to be SOB with some improvement, occ. dry cough.   Here to follow-up for pulmonary test results for interstitial lung disease workup. In terms of test results her ESR is high ANA is positive and her Sjogren's antibodies positive but lupus antibodies and rheumatoid factor antibodies are negative. Her CT scan does show interstitial lung disease at the base. She could not complete pulmonary function test due to inability to follow direction but I suspect the disease is mild because of walking desaturation test last visit was mild. Her daughter and she appeared to understand these test results. She reports dyspnea and cough. She is on ACE inhibitor.    OV 02/02/2017  Chief Complaint  Patient presents with  .  Follow-up    Pt states she  has been doing good since last visit. Denies any complaints of cough, SOB, or CP.   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  In the interim she has no complaints.  She only has mild dyspnea on exertion.  Walking desaturation test on 185 feet x3 laps on room air.  Resting pulse ox 100%.  Final  pulse ox 97%.  Resting heart rate was 70/min.  Final heart rate was 87/min.  She did see a rheumatologist Dr. Gavin Pound in practice last month.  I reviewed the note.  It appears that they are not finding any other systemic evidence of autoimmune disease.  Therefore she is still maintained on Plaquenil.  According to the notes patient is also Smith and RNP antibody positive.  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  Most recent CT scan of the chest in summer 2018 as a possible/probable UIP.  Results for VIKTORIA, GRUETZMACHER (MRN [id number]) as of 07/30/2016 10:00  Ref. Range 07/16/2016 12:09 07/16/2016 12:18  Nitric Oxide Unknown  16  Sed Rate Latest Ref Range: 0 - 30 mm/hr 77 (H)   Anit Nuclear Antibody(ANA) Latest Ref Range: NEGATIVE  POS (A)   ANA Pattern 1 Unknown NUCLEOLAR (A)   ANA Titer 1 Latest Units: titer 7:169 (H)   Cyclic Citrullin Peptide Ab Latest Units: Units <16   ds DNA Ab Latest Units: IU/mL 1   RA Latex Turbid. Latest Ref Range: <14 IU/mL <14   SSA (Ro) (ENA) Antibody, IgG Latest Ref Range: <1.0 NEG AI  >8.0 POS (H)   SSB (La) (ENA) Antibody, IgG Latest Ref Range: <1.0 NEG AI  <1.0 NEG   Scleroderma (Scl-70) (ENA) Antibody, IgG Latest Ref Range: <1.0 NEG AI  <1.0 NEG     IMPRESSION: CT CHEST HIGH RESOLUTION 1. 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 Differential includes fibrotic nonspecific interstitial pneumonia (NSIP). Follow-up high-resolution chest CT in 12 months would be useful to assess temporal pattern stability, as clinically warranted. 2. Solitary 3 mm solid right upper lobe pulmonary nodule. No follow-up needed if patient is low-risk. Non-contrast chest CT can be considered in 12 months if patient is high-risk. This recommendation follows the consensus statement: Guidelines for Management of Incidental  Pulmonary Nodules Detected on CT Images: From the Fleischner Society 2017; Radiology 2017; 284:228-243. 3. Left main and 3 vessel coronary atherosclerosis. 4. Small hiatal hernia. 5. Diffuse hepatic steatosis.  Aortic Atherosclerosis (ICD10-I70.0) and Emphysema (ICD10-J43.9).   Electronically Signed   By: [name] M.D.   On: 07/22/2016 14:17  OV 05/18/2017  Chief Complaint  Patient presents with  . Follow-up    PFT attempted today but pt was unable to complete as per Ria Comment pt did not want to do the PFT.  HRCT done 1/21.  Pt states she has been doing well. Breathing is about the same as last visit. Denies any complaints or concerns.    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  In the interim overall she is stable.  She is an extremely poor historian.  After asking her multiple times in different ways she believes earlier this year she had a cardiac stress test for a coronary artery calcification by Dr. Einar Gip and this was normal.  She is completely  unaware of the fact that she has interstitial lung disease.  She believes she has asthma but when I questioned her and directed her that I have explained to her that she has pulmonary fibrosis she then admitted to the in fact she understands that she has pulmonary fibrosis.  Overall she is stable she has mild dyspnea on exertion without any cough.  She thinks she had lupus diagnosed by a rheumatologist in [place] but when I told her that there is no systemic evidence of autoimmune disease when she saw [facility] she seems surprised that she even made that visit.  There are no new issues.   Walking desaturation test on 05/18/2017 185 feet x 3 laps on ROOM AIR:  did not have symptioms or  desaturate. Rest pulse ox was 100%, final pulse ox was 95%. HR response 69/min at rest to 90/min at peak exertion. Patient [name]  Did not Desaturate < 88% .  Nhyira Tyner yes did  Desaturated </= 3% points. Shalita Klare yes did get tachyardic   IMPRESSION: Lungs/Pleura: No pneumothorax. No pleural effusion. Posterior right upper lobe 3 mm solid pulmonary nodule (series 5/image 39), stable since 07/22/2016 chest CT. No acute consolidative airspace disease, lung masses or new significant pulmonary nodules. No significant air trapping on the expiration sequence. There is patchy confluent subpleural reticulation and ground-glass attenuation in both lungs with associated mild traction bronchiectasis and minimal architectural distortion. There is mild honeycombing throughout both lung bases. Findings have not convincingly progressed since 07/22/2016 chest CT. 1. No appreciable interval progression of basilar predominant fibrotic interstitial lung disease with mild honeycombing in the intervening 6 months. Findings are still considered to represent probable usual interstitial pneumonia (UIP), with the differential including fibrotic phase nonspecific interstitial pneumonia (NSIP). Follow-up high-resolution chest CT recommended in 12 months to assess ongoing temporal pattern stability. 2. Tiny right upper lobe pulmonary nodule is stable for 6 months and probably benign. 3. Left main and 3 vessel coronary atherosclerosis. 4. Small hiatal hernia.  Aortic Atherosclerosis (ICD10-I70.0).   Electronically Signed   By: Jason A Poff M.D.   On: 02/15/2017 16:05   OV 08/18/2017  Chief Complaint  Patient presents with  . Follow-up    PFT attempted today but pt was unable to complete.  Pt states she is about the same since last visit. Denies any real complaints of SOB and denies any real complaints.    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 Immunomodulators of probably due to toxic follow-up.  Moreover her ILD appears stable over time. Therefore we decided to treat her with  Spiriva. She is here for follow-up she tells me she is feeling better. She likes her Spiriva. This is actually helping her shortness of breath. Walking desaturation test is essentially normal. She had a pulmonary function test today but she could not follow through with instructions. There are no other new issues. She denies any ER visits   OV 02/15/2018  Subjective:  Patient ID: [name], female , DOB: 12/30/1940 , age 77 y.o. , MRN: [id number] , ADDRESS: [address] [place] [address]   02/15/2018 -   Chief Complaint  Patient presents with  . Follow-up    Pt states she has been doing well since last visit. States she does become SOB when she exerts herself and states she does have an occ.   dry cough. Denies any complaints of CP/chest tightness.    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 -last seen April 2019.  Is a routine follow-up for the above issues.  She expresses that she has dry eyes and dry mouth.  She again does not recollect seeing a rheumatologist.  She is on supportive care but she is on Plaquenil.  Therefore she is probably seeing a rheumatologist.  She is unable to do pulmonary function test therefore she did a walk test with us and it is stable and documented below she also had high-resolution CT chest that I visualized as documented below.  She has UIP pattern but it is stable and a year and a half now.  She has no worsening dyspnea.  No interim complaints.  She does have coronary artery calcification on the CT scan but she says she saw Dr. Ganji approximately a year ago and a stress test was normal.   She continues with Spiriva for the associated emphysema from remote smoking.  This is helping her.  We discussed about taking anti-fibrotic's which would be indicated if this was IPF but because she has autoimmune features it would be indicated only if she got worse.  In either event she seems somewhat nervous about taking a drug that has GI side effect potential.  She wants to wait and watch.     OV 06/14/2019  Subjective:  Patient ID: [name], female , DOB: 03/03/1940 , age 79 y.o. , MRN: [id number] , ADDRESS: [address] [place] [address] PCP Osei-Bonsu, George, MD Cards- Dr Ganji Rheum - Dr angela Hawkes  06/14/2019 -   Chief Complaint  Patient presents with  . Follow-up    Pt states she has been doing good since last visit and denies any complaints.   Follow-up IPAF (UIP with autoimmune serology; June 2018 ANA 1:320, SSA > 8, dry eyes/mount + eSR 71)     - on supportive care due to goals of care (could not do PFT technically 2019) -   Follow-up mild associated emphysema on CT scan   - on spiriva  HPI Josey Hlavac 79 y.o. -presents for follow-up.  Not seen her in over a year because of the COVID-19 pandemic.  She tells me that steadily and slowly in the last 1 year her shortness of breath with exertion has progressed.  Her effort tolerance is lower.  She states that walking in our office she felt more dyspneic than in the past.  In fact walking in our office her pulse ox dropped 8 points.  This is a change for her.  She does not want to do pulmonary function test because of technical challenges with her doing PFTs.  She prefers other modes of monitoring.  This would include CT scan of the chest.  At last visit she did not want entertain the idea of antifibrotic therapy.  She preferred supportive care but now that she is declining she is more open to the idea.  Her symptom scores are listed below.  In the interim she did get admitted to the hospital in January 2021 with  hyponatremia.  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    She is on Xarelto.    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  There is mention of intact RV function but no mention of pulmonary artery pressures this is based on review of the chart.  Labs in January 2021 shows a creatinine 1.13 mg percent with a GFR of 47 and a hemoglobin of 11.3 g%.. She was evaluated by Dr. Ganji.  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   OV 08/17/2019   Subjective:  Patient ID: [name], female , DOB: 05/09/1940, age 79 y.o. years. , MRN: [id number],  ADDRESS: [address] [place] [address] PCP  Osei-Bonsu, George, MD Providers : Treatment Team:  Attending Provider: Ramaswamy, Murali, MD  Follow-up IPAF (UIP with autoimmune serology; June 2018 ANA 1:320, SSA > 8, dry eyes/mount + eSR 71)     - on supportive care due to goals of care (could not do PFT technically 2019) -   Follow-up mild associated emphysema on CT scan   - on spiriva  Chief Complaint  Patient presents with  . Follow-up    pt states she has been doing okay since last visit. States that her breathing has become a little worse. Pt denies any complaints of cough, chest tightness, or wheezing.       HPI Elantra Goodwyn 79 y.o. -presents for follow-up.  Last seen in May 2021 at the time she is complaining of more shortness of breath.  So we did overnight oxygen study.  This was done in June 2021.  No desaturations.  No oxygen needed.  Today she presents again and she tells me that she has shortness of breath with exertion relieved by rest.  Similar to slightly worse compared to before as documented below.  We did a high-resolution CT chest with is unchanged.  Is consistent with UIP.  She tells me that Spiriva helped her in the past.  However there is no air-trapping emphysema mentioned in the  current CT chest.  2020 January CT chest but does mention some air trapping.  But there is no emphysema she never smoked.  She wants to try Spiriva again.  She has upcoming visit with her cardiologist in August 2021 associated with echocardiogram.  There are no other new issues.    SYMPTOM SCALE - ILD 06/14/2019  08/17/2019   O2 use ra ra  Shortness of Breath 0 -> 5 scale with 5 being worst (score 6 If unable to do)   At rest 0 2  Simple tasks - showers, clothes change, eating, shaving 1 2  Household (dishes, doing bed, laundry) 3 3  Shopping 5 4  Walking level at own pace 3 3  Walking up Stairs 5 5  Total (30-36) Dyspnea Score 17 19  How bad is your cough? 2   How bad is your fatigue 3   How bad is nausea 00   How bad is vomiting?  0   How bad is diarrhea? 0   How bad is anxiety? 0 0  How bad is depression 0 1         Simple office walk 185 feet x  3 laps goal with forehead probe 08/18/2017    02/15/2018  06/14/2019   O2 used *room air Room air ra  Number laps completed 3 3 3  Comments about pace Normal pace Slow pace Slow pace  Resting Pulse Ox/HR 100%   and 67/min 100% and 73/min 100% and 63/mi  Final Pulse Ox/HR 100% and 91/min 100% and 96/min 92% and 84/mi  Desaturated </= 88% no No  no  Desaturated <= 3% points no no Yues, 8 ponts  Got Tachycardic >/= 90/min yes yes no  Symptoms at end of test Mild dyspnea Mild dyspnea Mild dyspnea  Miscellaneous comments x x       Results for Reichelt, Drisana (MRN [id number]) as of 06/14/2019 10:26  Ref. Range 05/18/2017 12:44  FVC-Pre Latest Units: L 1.61  FVC-%Pred-Pre Latest Units: % 63     COMPARISON:  Chest CT 02/11/2018. -> July 2021  FINDINGS: Cardiovascular: Heart size is normal. There is no significant pericardial fluid, thickening or pericardial calcification. There is aortic atherosclerosis, as well as atherosclerosis of the great vessels of the mediastinum and the coronary arteries, including calcified  atherosclerotic plaque in the left main, left anterior descending, left circumflex and right coronary arteries. Calcifications of the mitral annulus.  Mediastinum/Nodes: No pathologically enlarged mediastinal or hilar lymph nodes. Please note that accurate exclusion of hilar adenopathy is limited on noncontrast CT scans. Esophagus is unremarkable in appearance. No axillary lymphadenopathy.  Lungs/Pleura: High-resolution images again demonstrate areas of septal thickening, subpleural reticulation, parenchymal banding, traction bronchiectasis, peripheral bronchiolectasis and mild honeycombing. These findings have a definitive craniocaudal gradient. No significant progression of disease compared to the prior study. Inspiratory and expiratory imaging is unremarkable. No acute consolidative airspace disease. No pleural effusions. No suspicious appearing pulmonary nodules or masses are noted. No acute consolidative airspace disease. No pleural effusions.  Upper Abdomen: Aortic atherosclerosis.  Musculoskeletal: There are no aggressive appearing lytic or blastic lesions noted in the visualized portions of the skeleton.  IMPRESSION: 1. The appearance of the lungs is compatible with interstitial lung disease, with a spectrum of findings considered diagnostic of usual interstitial pneumonia (UIP) per current ATS guidelines. No significant progression of disease compared to the prior examination. 2. Aortic atherosclerosis, in addition to left main and 3 vessel coronary artery disease. Assessment for potential risk factor modification, dietary therapy or pharmacologic therapy may be warranted, if clinically indicated.  Aortic Atherosclerosis (ICD10-I70.0).   Electronically Signed   By: Daniel  Entrikin M.D.   On: 07/29/2019 11:01 ROS - per HPI      OV 05/07/2020  Subjective:  Patient ID: [name], female , DOB: 12/04/1940 , age 80 y.o. , MRN: [id number] , ADDRESS: [address] [place] [address] PCP Smith, Candace, MD Patient Care Team: Smith, Candace, MD as PCP - General (Family Medicine) Hawkes, Angela, MD as Consulting Physician (Rheumatology)  This Provider for this visit: Treatment Team:  Attending Provider: Ramaswamy, Murali, MD    05/07/2020 -   Chief Complaint  Patient presents with  . Follow-up    Still having shortness of breath with activity.     Follow-up IPAF (UIP with autoimmune serology; June 2018 ANA 1:320, SSA > 8, dry eyes/mount + eSR 71)     - on supportive care due to goals of care (could not do PFT technically 2019) -   - last CT July 2021  Follow-up mild associated emphysema on CT scan   - on spiriva  [facility] feb 2022 -  - gr1 ddx. Normal PA/RV and LV  HPI [name] 80 y.o. -returns for follow-up.  She says that compared to last visit she is somewhat better.  She is seeing cardiology and has had echocardiogram in August 2021 and February 2022.  She has stable   LV function and RV function.  No elevation pulmonary pressures.  She has grade 1 diastolic dysfunction.  She uses albuterol as needed and this helps her dyspnea.  She does not think rehabilitation will benefit her.  She tried Spiriva and did not help her.  She does not want to use it again.  She is okay with continued supportive care monitoring.  This time she said she is willing to give spirometry a try as part of her monitoring plan.  She is agreed to have high-resolution CT chest in 6 months.  Her ILD symptom score shows stability.  A walking desaturation test shows stability.     SYMPTOM SCALE - ILD 06/14/2019  08/17/2019  05/07/2020   O2 use ra ra ra  Shortness of Breath 0 -> 5 scale with 5 being worst (score 6 If unable to do)    At rest 0 2 0  Simple tasks - showers, clothes change, eating, shaving 1 2 1  Household (dishes, doing bed, laundry) 3 3 4  Shopping 5 4 5  Walking level at own pace 3 3 3  Walking up Stairs 5 5 4  Total (30-36) Dyspnea  Score 17 19 17  How bad is your cough? 2  1  How bad is your fatigue 3  3  How bad is nausea 00  0  How bad is vomiting?  0  0  How bad is diarrhea? 0  0  How bad is anxiety? 0 0 0  How bad is depression 0 1 0         Simple office walk 185 feet x  3 laps goal with forehead probe 08/18/2017    02/15/2018  06/14/2019  05/07/2020   O2 used *room air Room air ra ra  Number laps completed 3 3 3 3  Comments about pace Normal pace Slow pace Slow pace slow  Resting Pulse Ox/HR 100% and 67/min 100% and 73/min 100% and 63/mi 100% and 67/min  Final Pulse Ox/HR 100% and 91/min 100% and 96/min 92% and 84/mi 100% and 91/  Desaturated </= 88% no No  no no  Desaturated <= 3% points no no Yues, 8 ponts no  Got Tachycardic >/= 90/min yes yes no yes  Symptoms at end of test Mild dyspnea Mild dyspnea Mild dyspnea Mild dyspnea  Miscellaneous comments x x        Results for Walmsley, Ellawyn (MRN [id number]) as of 06/14/2019 10:26  Ref. Range 05/18/2017 12:44  FVC-Pre Latest Units: L 1.61  FVC-%Pred-Pre Latest Units: % 63     PFT  PFT Results Latest Ref Rng & Units 05/18/2017  FVC-Pre L 1.61  FVC-Predicted Pre % 63  Pre FEV1/FVC % % 93  FEV1-Pre L 1.51  FEV1-Predicted Pre % 76       has a past medical history of Breast cancer (HCC), Diabetes mellitus without complication (HCC), Hypertension, Hypothyroidism, and Lupus (HCC).   reports that she has never smoked. She quit smokeless tobacco use about 5 years ago.  Her smokeless tobacco use included snuff.  Past Surgical History:  Procedure Laterality Date  . BREAST LUMPECTOMY  2009    Allergies  Allergen Reactions  . Ciprofloxacin Itching, Swelling and Other (See Comments)    Possibly causing tremors?  . Crestor [Rosuvastatin] Other (See Comments)    Myalgia and back pain    Immunization History  Administered Date(s) Administered  . Influenza, High Dose Seasonal PF 11/09/2016, 10/27/2018  . Influenza,inj,Quad PF,6+ Mos  10/27/2015  .   PFIZER(Purple Top)SARS-COV-2 Vaccination 04/22/2019, 05/13/2019  . Pneumococcal Conjugate-13 02/15/2018    Family History  Problem Relation Age of Onset  . Cancer Father        ? type  . Asthma Brother      Current Outpatient Medications:  .  albuterol (VENTOLIN HFA) 108 (90 Base) MCG/ACT inhaler, Inhale 2 puffs into the lungs every 4 (four) hours as needed for wheezing or shortness of breath., Disp: 18 g, Rfl: 3 .  atorvastatin (LIPITOR) 20 MG tablet, TAKE 1 TABLET(20 MG) BY MOUTH DAILY, Disp: 90 tablet, Rfl: 3 .  capsaicin (ZOSTRIX) 0.025 % cream, Apply topically 3 (three) times daily as needed (for pain). , Disp: , Rfl:  .  fluticasone (FLONASE) 50 MCG/ACT nasal spray, Place 1 spray into both nostrils daily., Disp: 16 g, Rfl: 0 .  gabapentin (NEURONTIN) 300 MG capsule, Take 300 mg by mouth 2 (two) times daily., Disp: , Rfl:  .  glimepiride (AMARYL) 2 MG tablet, Take 2 mg by mouth daily., Disp: , Rfl:  .  hydroxychloroquine (PLAQUENIL) 200 MG tablet, Take 1 tablet (200 mg total) by mouth daily., Disp: 30 tablet, Rfl: 0 .  levothyroxine (SYNTHROID, LEVOTHROID) 75 MCG tablet, Take 75 mcg by mouth daily before breakfast. , Disp: , Rfl:  .  losartan (COZAAR) 50 MG tablet, Take 50 mg by mouth daily., Disp: , Rfl:  .  losartan-hydrochlorothiazide (HYZAAR) 50-12.5 MG tablet, Take 1 tablet by mouth daily., Disp: , Rfl:  .  omeprazole (PRILOSEC) 20 MG capsule, Take 20 mg by mouth daily., Disp: , Rfl:  .  Rivaroxaban (XARELTO) 15 MG TABS tablet, Take 1 tablet (15 mg total) by mouth daily with supper., Disp: 180 tablet, Rfl: 1 .  cetirizine (ZYRTEC ALLERGY) 10 MG tablet, Take 1 tablet (10 mg total) by mouth daily. (Patient not taking: Reported on 05/07/2020), Disp: 30 tablet, Rfl: 0      Objective:   Vitals:   05/07/20 1030  BP: 122/70  Pulse: 67  Temp: (!) 97.5 F (36.4 C)  TempSrc: Temporal  SpO2: 100%  Weight: 156 lb 12.8 oz (71.1 kg)  Height: 5' 7" (1.702 m)     Estimated body mass index is 24.56 kg/m as calculated from the following:   Height as of this encounter: 5' 7" (1.702 m).   Weight as of this encounter: 156 lb 12.8 oz (71.1 kg).  _0 @  Filed Weights   05/07/20 1030  Weight: 156 lb 12.8 oz (71.1 kg)     Physical Exam  General: No distress. Looks well Neuro: Alert and Oriented x 3. GCS 15. Speech normal Psych: Pleasant Resp:  Barrel Chest - no.  Wheeze - no, Crackles - yes, No overt respiratory distress CVS: Normal heart sounds. Murmurs - no Ext: Stigmata of Connective Tissue Disease - no HEENT: Normal upper airway. PEERL +. No post nasal drip        Assessment:       ICD-10-CM   1. ILD (interstitial lung disease) ([place])  J84.9   2. 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 Pulmonary air trapping  R09.89   4. Dyspnea on exertion  R06.00        Plan:     Patient Instructions     ICD-10-CM   1. Dyspnea on exertion  R06.00   2. ILD (interstitial lung disease) ([place])  J84.9   3. 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 Pulmonary air trapping  R09.89  Shortness of breath there is partly because of your pulmonary fibrosis otherwise call interstitial lung disease. Also, stiff heart muscle that is mild and stable  The good news is that   - based on CT scan between January 2020 in July 2021 and symptoms/walk between July 2021 and April 2022 the pulmonary fibrosis is not worse  In fact you do not need oxygen at this point in time  Too bad spiriva did not help but glad albuterola as needed helps  Plan -No indication for prednisone or antifibrotic therapy for pulmonary fibrosis -No indication for oxygen at this point in time - attempt spirometry and dlco in 6 months  - do HRCTsupine and prone in 6 months    Follow-up -6 months and a 30-minute slot to interstitial lung disease clinic  -Symptom questionnaire and simple walk test at follow-up   SIGNATURE     Dr. Murali Ramaswamy, M.D., F.C.C.P,  Pulmonary and Critical Care Medicine Staff Physician, [facility] Director - Interstitial Lung Disease  Program  [facility], [place], [address]  Pager: [phone], If no answer or between  15:00h - 7:00h: call [phone] Telephone: [phone]  11:06 AM 05/07/2020  

## 2020-07-17 ENCOUNTER — Ambulatory Visit: Payer: Medicare Other | Admitting: Cardiology

## 2020-07-17 ENCOUNTER — Other Ambulatory Visit: Payer: Self-pay

## 2020-07-17 ENCOUNTER — Encounter: Payer: Self-pay | Admitting: Cardiology

## 2020-07-17 VITALS — BP 130/70 | HR 71 | Temp 97.9°F | Resp 17 | Ht 67.0 in | Wt 158.0 lb

## 2020-07-17 DIAGNOSIS — I38 Endocarditis, valve unspecified: Secondary | ICD-10-CM

## 2020-07-17 DIAGNOSIS — E1122 Type 2 diabetes mellitus with diabetic chronic kidney disease: Secondary | ICD-10-CM

## 2020-07-17 DIAGNOSIS — J849 Interstitial pulmonary disease, unspecified: Secondary | ICD-10-CM

## 2020-07-17 DIAGNOSIS — N1831 Chronic kidney disease, stage 3a: Secondary | ICD-10-CM

## 2020-07-17 DIAGNOSIS — R0609 Other forms of dyspnea: Secondary | ICD-10-CM

## 2020-07-17 DIAGNOSIS — M329 Systemic lupus erythematosus, unspecified: Secondary | ICD-10-CM

## 2020-07-17 NOTE — Progress Notes (Signed)
Primary Physician/Referring:  Carol Ada, MD  Patient ID: [name], female    DOB: 01-09-1941, 80 y.o.   MRN: [id number]  Chief Complaint  Patient presents with   marantic endocarditis   Anticoagulation   HPI:    [name]  is a 80 y.o. 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  She is presently on chronic Xarelto due to high risk for cardioembolic phenomena.  Hypercoagulable workup was negative.  I had also discuss this with Dr. Gavin Pound and she had mentioned from rheumatologic standpoint, her lupus has been very stable and very mild.  She presents here for 24-monthoffice visit and follow-up of sporadic endocarditis.  She follows Dr. RChase Callerfor her ILD, has chronic shortness of breath has been stableThis is a new symptom that she endorses today.  No changes in her weight or leg edema. No PND or orthopnea.  Past Medical History:  Diagnosis Date   Breast cancer ([facility]    Diabetes mellitus without complication ([facility]    Hypertension    Hypothyroidism    Lupus ([facility]    Past Surgical History:  Procedure Laterality Date   BREAST LUMPECTOMY  2009   Family History  Problem Relation Age of Onset   Cancer Father        ? type   Asthma Brother     Social History   Tobacco Use   Smoking status: Never   Smokeless tobacco: Former    Types: Snuff    Quit date: 02/03/2015  Substance Use Topics   Alcohol use: No   Marital Status: Single  ROS  Review of Systems  Cardiovascular:  Positive for chest pain and dyspnea on exertion. Negative for leg swelling.   Respiratory:  Positive for wheezing.   Musculoskeletal:  Positive for arthritis and back pain.  Gastrointestinal:  Negative for melena.  Objective  Blood pressure 130/70, pulse 71, temperature 97.9 F (36.6 C), temperature source Temporal, resp. rate 17, height _0  (1.702 m), weight 158 lb (71.7 kg), SpO2 100 %.  Vitals with BMI 07/17/2020 05/07/2020 04/15/2020  Height _1  _2  _3   Weight 158 lbs 156 lbs 13 oz 157 lbs 13 oz  BMI 24.74 [id number] Systolic [id number] Diastolic 70 70 82  Pulse 71 67 72     Physical Exam Cardiovascular:     Rate and Rhythm: Normal rate and regular rhythm.     Pulses: Intact distal pulses.     Heart sounds: Normal heart sounds. No murmur heard.   No gallop.     Comments: No leg edema, no JVD. Pulmonary:     Effort: Pulmonary effort is normal.     Breath sounds: Rales (coarse bilateral basal leathery crackles) present.  Abdominal:     General: Bowel sounds are normal.     Palpations: Abdomen is soft.  Skin:    Capillary Refill: Capillary refill takes less than 2 seconds.  Neurological:     Mental Status: [name], place, and time.  Psychiatric:        Mood and Affect: Mood normal.   Laboratory examination:  No results for input(s): NA, K, CL, CO2, GLUCOSE, BUN, CREATININE, CALCIUM, GFRNONAA, GFRAA in the last 8760 hours. CrCl cannot be calculated (Patient's most recent lab result is older than the maximum 21 days allowed.).  CMP Latest Ref Rng & Units 02/20/2019 02/18/2019 02/17/2019  Glucose 70 - 99 mg/dL 121(H) 50(L) 63(L)  BUN 8 - 23 mg/dL _0 Creatinine 0.44 - 1.00 mg/dL 1.13(H) 1.23(H) 1.34(H)  Sodium 135 - 145 mmol/L 131(L) 126(L) 121(L)  Potassium 3.5 - 5.1 mmol/L 4.5 4.4 3.6  Chloride 98 - 111 mmol/L 97(L) 94(L) 88(L)  CO2 22 - 32 mmol/L _1 Calcium 8.9 - 10.3 mg/dL 9.4 9.2 9.1  Total Protein 6.5 - 8.1 g/dL 7.8 8.0 -  Total Bilirubin 0.3 - 1.2 mg/dL 0.5 0.3 -  Alkaline Phos 38 - 126 U/L 50 57 -   AST 15 - 41 U/L 23 27 -  ALT 0 - 44 U/L 18 19 -   CBC Latest Ref Rng & Units 02/20/2019 02/18/2019 02/17/2019  WBC 4.0 - 10.5 K/uL 4.8 5.7 5.7  Hemoglobin 12.0 - 15.0 g/dL 11.3(L) 12.2 12.7  Hematocrit 36.0 - 46.0 % 33.3(L) 36.8 37.5  Platelets 150 - 400 K/uL 212 225 238   TSH No results for input(s): TSH in the last 8760 hours.  External labs:  Labs 06/25/2020:  A1c 7.5%.  Hb 11.3/HCT 33.7, platelets 234.  Normal indicis.  Serum glucose 122, BUN 13, creatinine 1.20, EGFR 46 mL, potassium 4.3, CMP otherwise normal.  Total cholesterol 104, triglycerides 113, HDL 37, LDL 67.  Non-HDL cholesterol 113.  Medications and allergies   Allergies  Allergen Reactions   Ciprofloxacin Itching, Swelling and Other (See Comments)    Possibly causing tremors?   Crestor [Rosuvastatin] Other (See Comments)    Myalgia and back pain    Outpatient Medications Prior to Visit  Medication Sig Dispense Refill   albuterol (VENTOLIN HFA) 108 (90 Base) MCG/ACT inhaler Inhale 2 puffs into the lungs every 4 (four) hours as needed for wheezing or shortness of breath. 18 g 3   atorvastatin (LIPITOR) 20 MG tablet TAKE 1 TABLET(20 MG) BY MOUTH DAILY 90 tablet 3   capsaicin (ZOSTRIX) 0.025 % cream Apply topically 3 (three) times daily as needed (for pain).      gabapentin (NEURONTIN) 300 MG capsule Take 300 mg by mouth 2 (two) times daily.     glimepiride (AMARYL) 2 MG tablet Take 2 mg by mouth daily.     hydroxychloroquine (PLAQUENIL) 200 MG tablet Take 1 tablet (200 mg total) by mouth daily. 30 tablet 0   levothyroxine (SYNTHROID, LEVOTHROID) 75 MCG tablet Take 75 mcg by mouth daily before breakfast.      losartan-hydrochlorothiazide (HYZAAR) 50-12.5 MG tablet Take 1 tablet by mouth daily.     omeprazole (PRILOSEC) 20 MG capsule Take 20 mg by mouth daily.     Rivaroxaban (XARELTO) 15 MG TABS tablet Take 1 tablet (15 mg total) by mouth daily with supper. 180 tablet 1   cetirizine (ZYRTEC ALLERGY) 10 MG  tablet Take 1 tablet (10 mg total) by mouth daily. 30 tablet 0   fluticasone (FLONASE) 50 MCG/ACT nasal spray Place 1 spray into both nostrils daily. 16 g 0   losartan (COZAAR) 50 MG tablet Take 50 mg by mouth daily.     No facility-administered medications prior to visit.     Radiology:   CT head without contrast and CT cervical spine without contrast 02/18/2019: 1. No  acute intracranial pathology. 2. No acute/traumatic cervical spine pathology. Multilevel degenerative changes most prominent at C4-C5.  HRCT chest 07/29/2019: 1. The appearance of the lungs is compatible with interstitial lung disease, with a spectrum of findings considered diagnostic of usual interstitial pneumonia (UIP) per current ATS guidelines. No significant progression of disease compared to the prior  examination. 2. Aortic atherosclerosis, in addition to left main and 3 vessel coronary artery disease. Assessment for potential risk factor modification, dietary therapy or pharmacologic therapy may be warranted, if clinically indicated. 3. Aortic Atherosclerosis (ICD10-I70.0).  Cardiac Studies:   Lexiscan myoview stress test 07/03/2016: 1. The resting electrocardiogram demonstrated normal sinus rhythm, normal resting conduction, PVC and normal rest repolarization. Stress EKG is non-diagnostic for ischemia as it a pharmacologic stress using Lexiscan. Stress symptoms included dyspnea. 2. Myocardial perfusion imaging is normal. Overall left ventricular systolic function was normal without regional wall motion abnormalities. The left ventricular ejection fraction was 75%.   Carotid artery duplex  02/18/2019: Right Carotid: Velocities in the right ICA are consistent with a 1-39% stenosis. Left Carotid: Velocities in the left ICA are consistent with a 1-39% stenosis. Vertebrals:  Bilateral vertebral arteries demonstrate antegrade flow. Subclavians: Normal flow hemodynamics were seen in bilateral subclavian  arteries.  Echocardiogram 03/21/2020: Left ventricle cavity is normal in size. Moderate concentric hypertrophy of the left ventricle. Normal global wall motion. Normal LV systolic function with EF 66%. Doppler evidence of grade I (impaired) diastolic dysfunction, normal LAP.  Trileaflet aortic valve.  Trace aortic regurgitation. Focal echodensity at the tip of anterior mitral leaflet could represent calcification or healed vegetation. Mild (Grade I) mitral regurgitation. Mild tricuspid regurgitation.  Mild pulmonic regurgitation. No evidence of pulmonary hypertension. No significant change compared to previous study on 09/01/2019  EKG  EKG 04/15/2020: Normal sinus rhythm at rate of 64 bpm, left atrial enlargement, left axis deviation, left anterior fascicular block.  Poor R wave progression, cannot exclude anteroseptal infarct old.  Nonspecific T abnormality.  No significant change from prior EKG.  EKG 09/15/2019: Normal sinus rhythm at rate of 75 bpm, left atrial enlargement, left axis deviation.  Poor R wave progression, cannot exclude anteroseptal infarct old.  PACs (2).  Nonspecific T abnormality.    EKG 02/17/2019: Normal sinus rhythm at 61 bpm, normal axis, PRWP cannot exclude anterior infarct old. Nonspecific T wave abnormality.  Assessment     ICD-10-CM   1. Marantic endocarditis  I38 PCV ECHOCARDIOGRAM COMPLETE    2. Dyspnea on exertion  R06.00 PCV ECHOCARDIOGRAM COMPLETE    3. ILD (interstitial lung disease) ([place])  J84.9     4. History of systemic lupus erythematosus (SLE) ([facility])  M32.9     5. Type 2 diabetes mellitus with stage 3a chronic kidney disease, without long-term current use of insulin ([facility])  E11.22    N18.31       Medications Discontinued During This Encounter  Medication Reason   cetirizine (ZYRTEC ALLERGY) 10 MG tablet Error   fluticasone (FLONASE) 50 MCG/ACT nasal spray Error   losartan (COZAAR) 50 MG tablet Error   Rivaroxaban (XARELTO) 15 MG TABS tablet  Completed Course   No orders of the defined types were placed in this encounter.  Recommendations:   Adalynne Steffensmeier is a 80 y.o. 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  She is presently on chronic Xarelto due to high risk for cardioembolic phenomena.  Hypercoagulable workup was negative.  I had also discuss this with Dr. Gavin Pound and she had mentioned from rheumatologic standpoint, her lupus has been very stable and very mild.  She now presents for 9-monthoffice visit.  I discussed the risks and benefits of being on anticoagulation.  He would like to discontinue Xarelto for now, repeat echocardiogram in 3 months and if there is any recurrence or increasing size of the mass on the mitral leaflet, we could certainly reinitiate anticoagulation.  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 RChase Caller  She has not had any chest pain, she is diabetic and also has coronary calcification, she is on a statin, and has had a lot of stress test in 2018.  I reviewed external labs, excellent control of lipids.  She does have uncontrolled diabetes and stage III chronic kidney disease that has remained stable as well.  I will see her back in 3 months for follow-up.  This will be done after the echocardiogram.    JAdrian Prows MD, [facility]6/22/2022, 1:06 PM Office: [phone]

## 2020-10-10 ENCOUNTER — Ambulatory Visit: Payer: Medicare Other

## 2020-10-10 ENCOUNTER — Other Ambulatory Visit: Payer: Self-pay

## 2020-10-10 DIAGNOSIS — R0600 Dyspnea, unspecified: Secondary | ICD-10-CM

## 2020-10-10 DIAGNOSIS — I38 Endocarditis, valve unspecified: Secondary | ICD-10-CM

## 2020-10-10 DIAGNOSIS — R0609 Other forms of dyspnea: Secondary | ICD-10-CM

## 2020-10-17 ENCOUNTER — Other Ambulatory Visit: Payer: Self-pay

## 2020-10-17 ENCOUNTER — Encounter: Payer: Self-pay | Admitting: Cardiology

## 2020-10-17 ENCOUNTER — Ambulatory Visit: Payer: Medicare Other | Admitting: Cardiology

## 2020-10-17 VITALS — BP 131/70 | HR 71 | Temp 98.0°F | Resp 16 | Ht 67.0 in | Wt 156.0 lb

## 2020-10-17 DIAGNOSIS — R0600 Dyspnea, unspecified: Secondary | ICD-10-CM

## 2020-10-17 DIAGNOSIS — J849 Interstitial pulmonary disease, unspecified: Secondary | ICD-10-CM

## 2020-10-17 DIAGNOSIS — R0609 Other forms of dyspnea: Secondary | ICD-10-CM

## 2020-10-17 DIAGNOSIS — I38 Endocarditis, valve unspecified: Secondary | ICD-10-CM

## 2020-10-17 NOTE — Progress Notes (Signed)
Primary Physician/Referring:  Carol Ada, MD  Patient ID: [name], female    DOB: 05-30-1940, 80 y.o.   MRN: [id number]  Chief Complaint  Patient presents with   Marantic endocarditis   Follow-up    3 month   Results    echo   Hypertension   HPI:    [name]  is a 80 y.o. 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    Patient discontinued Xarelto 3 months ago after shared decision making, she is presently doing well and has not had any worsening dyspnea, has not had any TIA-like symptoms.  She has not had any recent hospitalization.   She presents here for 8-monthoffice visit and follow-up of marantic endocarditis.  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  Past Medical History:  Diagnosis Date   [place] [facility] [place]    Diabetes mellitus without complication ([place]    Hypertension    Hypothyroidism    [place]    Past Surgical History:  Procedure Laterality Date   BREAST LUMPECTOMY  2009   Family History  Problem Relation Age of Onset   Cancer Father        ? type   Asthma Brother     Social History   Tobacco Use   Smoking status: Never   Smokeless tobacco: Former    Types: Snuff    Quit date: 02/03/2015  Substance Use Topics   Alcohol use: No   Marital Status: Single  ROS  Review of Systems  Cardiovascular:  Positive for dyspnea on exertion (stable). Negative for chest pain and leg swelling.  Respiratory:  Positive for wheezing.   Musculoskeletal:  Positive for arthritis and back  pain.  Gastrointestinal:  Negative for melena.  Objective  Blood pressure 131/70, pulse 71, temperature 98 F (36.7 C), resp. rate 16, height 5' 7"  (1.702 m), weight 156 lb (70.8 kg), SpO2 99 %.  Vitals with BMI 10/17/2020 07/17/2020 05/07/2020  Height 5' 7"  5' 7"  5' 7"   Weight 156 lbs 158 lbs 156 lbs 13 oz  BMI 24.43 [phone] Systolic [phone] Diastolic 70 70 70  Pulse 71 71 67     Physical Exam Neck:     Vascular: No carotid bruit or JVD.  Cardiovascular:     Rate and Rhythm: Normal rate and regular rhythm.     Pulses: Intact distal pulses.     Heart sounds: Normal heart sounds. No murmur heard.   No gallop.  Pulmonary:     Effort: Pulmonary effort is normal.     Breath sounds: Rales (coarse bilateral basal leathery crackles) present.  Abdominal:     General: Bowel sounds are normal.     Palpations: Abdomen is soft.  Musculoskeletal:        General: No swelling.     Cervical back: Neck supple.  Skin:    Capillary Refill: Capillary refill takes less than 2 seconds.  Neurological:     Mental Status: [name], place, and time.  Psychiatric:        Mood  and Affect: Mood normal.   Laboratory examination:   No results for input(s): NA, K, CL, CO2, GLUCOSE, BUN, CREATININE, CALCIUM, GFRNONAA, GFRAA in the last 8760 hours. CrCl cannot be calculated (Patient's most recent lab result is older than the maximum 21 days allowed.).  CMP Latest Ref Rng & Units 02/20/2019 02/18/2019 02/17/2019  Glucose 70 - 99 mg/dL 121(H) 50(L) 63(L)  BUN 8 - 23 mg/dL 13 13 13   Creatinine 0.44 - 1.00 mg/dL 1.13(H) 1.23(H) 1.34(H)  Sodium 135 - 145 mmol/L 131(L) 126(L) 121(L)  Potassium 3.5 - 5.1 mmol/L 4.5 4.4 3.6  Chloride 98 - 111 mmol/L 97(L) 94(L) 88(L)  CO2 22 - 32 mmol/L 24 22 23   Calcium 8.9 - 10.3 mg/dL 9.4 9.2 9.1  Total Protein 6.5 - 8.1 g/dL 7.8 8.0 -  Total Bilirubin 0.3 - 1.2 mg/dL 0.5 0.3 -  Alkaline Phos 38 - 126 U/L 50 57 -  AST 15 - 41 U/L 23 27 -  ALT 0 -  44 U/L 18 19 -   CBC Latest Ref Rng & Units 02/20/2019 02/18/2019 02/17/2019  WBC 4.0 - 10.5 K/uL 4.8 5.7 5.7  Hemoglobin 12.0 - 15.0 g/dL 11.3(L) 12.2 12.7  Hematocrit 36.0 - 46.0 % 33.3(L) 36.8 37.5  Platelets 150 - 400 K/uL 212 225 238   TSH No results for input(s): TSH in the last 8760 hours.  External labs:  Labs 06/25/2020:  A1c 7.5%.  Hb 11.3/HCT 33.7, platelets 234.  Normal indicis.  Serum glucose 122, BUN 13, creatinine 1.20, EGFR 46 mL, potassium 4.3, CMP otherwise normal.  Total cholesterol 104, triglycerides 113, HDL 37, LDL 67.  Non-HDL cholesterol 113.  Medications and allergies   Allergies  Allergen Reactions   Ciprofloxacin Itching, Swelling and Other (See Comments)    Possibly causing tremors?   Crestor [Rosuvastatin] Other (See Comments)    Myalgia and back pain    Outpatient Medications Prior to Visit  Medication Sig Dispense Refill   albuterol (VENTOLIN HFA) 108 (90 Base) MCG/ACT inhaler Inhale 2 puffs into the lungs every 4 (four) hours as needed for wheezing or shortness of breath. 18 g 3   capsaicin (ZOSTRIX) 0.025 % cream Apply topically 3 (three) times daily as needed (for pain).      ferrous sulfate 324 MG TBEC Take 324 mg by mouth.     gabapentin (NEURONTIN) 300 MG capsule Take 300 mg by mouth 2 (two) times daily.     glimepiride (AMARYL) 2 MG tablet Take 2 mg by mouth daily.     hydroxychloroquine (PLAQUENIL) 200 MG tablet Take 1 tablet (200 mg total) by mouth daily. 30 tablet 0   levothyroxine (SYNTHROID, LEVOTHROID) 75 MCG tablet Take 75 mcg by mouth daily before breakfast.      losartan-hydrochlorothiazide (HYZAAR) 50-12.5 MG tablet Take 1 tablet by mouth daily.     omeprazole (PRILOSEC) 20 MG capsule Take 20 mg by mouth daily.     atorvastatin (LIPITOR) 20 MG tablet TAKE 1 TABLET(20 MG) BY MOUTH DAILY 90 tablet 3   XARELTO 15 MG TABS tablet Take 1 tablet by mouth daily at 6 (six) AM.     No facility-administered medications prior to visit.      Radiology:   CT head without contrast and CT cervical spine without contrast 02/18/2019: 1. No acute intracranial pathology. 2. No acute/traumatic cervical spine pathology. Multilevel degenerative changes most prominent at C4-C5.  HRCT chest 07/29/2019: 1. The appearance of the lungs is compatible with interstitial lung disease,  with a spectrum of findings considered diagnostic of usual interstitial pneumonia (UIP) per current ATS guidelines. No significant progression of disease compared to the prior  examination. 2. Aortic atherosclerosis, in addition to left main and 3 vessel coronary artery disease. Assessment for potential risk factor modification, dietary therapy or pharmacologic therapy may be warranted, if clinically indicated. 3. Aortic Atherosclerosis (ICD10-I70.0).  Cardiac Studies:   Lexiscan myoview stress test 07/03/2016: 1. The resting electrocardiogram demonstrated normal sinus rhythm, normal resting conduction, PVC and normal rest repolarization. Stress EKG is non-diagnostic for ischemia as it a pharmacologic stress using Lexiscan. Stress symptoms included dyspnea. 2. Myocardial perfusion imaging is normal. Overall left ventricular systolic function was normal without regional wall motion abnormalities. The left ventricular ejection fraction was 75%.   Carotid artery duplex  02/18/2019: Right Carotid: Velocities in the right ICA are consistent with a 1-39% stenosis. Left Carotid: Velocities in the left ICA are consistent with a 1-39% stenosis. Vertebrals:  Bilateral vertebral arteries demonstrate antegrade flow. Subclavians: Normal flow hemodynamics were seen in bilateral subclavian arteries.  Echocardiogram 03/21/2020: Left ventricle cavity is normal in size. Moderate concentric hypertrophy of the left ventricle. Normal global wall motion. Normal LV systolic function with EF 66%. Doppler evidence of grade I (impaired) diastolic dysfunction, normal LAP.  Trileaflet  aortic valve.  Trace aortic regurgitation. Focal echodensity at the tip of anterior mitral leaflet could represent calcification or healed vegetation. Mild (Grade I) mitral regurgitation. Mild tricuspid regurgitation.  Mild pulmonic regurgitation. No evidence of pulmonary hypertension. No significant change compared to previous study on 09/01/2019  PCV ECHOCARDIOGRAM COMPLETE 10/10/2020  Narrative Echocardiogram 10/10/2020: Normal LV systolic function with visual EF 60-65%. Left ventricle cavity is normal in size. Normal left ventricular wall thickness. Normal global wall motion. Normal diastolic filling pattern, normal LAP. Native mitral valve with focal echodensity at the tip of anterior mitral leaflet could represent calcification or healed vegetation cannot be excluded (noted on prior study). No mitral regurgitation or stenosis. Mild tricuspid regurgitation. No evidence of pulmonary hypertension. RVSP measures 30 mmHg. Mild pulmonic regurgitation. Compared to study 03/21/2020 no significant change.  EKG  EKG 10/17/2020: Normal sinus rhythm at rate of 62 bpm, normal axis, diffuse nonspecific T abnormality. Compared to 04/15/2020, left axis deviation not present.   Assessment     ICD-10-CM   1. 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 Dyspnea on exertion  R06.00 PCV ECHOCARDIOGRAM COMPLETE    3. ILD (interstitial lung disease) ([place])  J84.9       Medications Discontinued During This Encounter  Medication Reason   atorvastatin (LIPITOR) 20 MG tablet Error   XARELTO 15 MG TABS tablet Completed Course   No orders of the defined types were placed in this encounter.  Recommendations:   Kaeleigh Westendorf is a 80 y.o. 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    Patient discontinued Xarelto 3 months ago after shared decision making, she is presently doing well and has not had any worsening dyspnea, has not had any TIA-like symptoms.  She has not had any recent hospitalization.  No change in physical  exam.  I reviewed the results of the echocardiogram revealing what appears to be like a well-healed vegetation at the tip of mitral valve.  We will continue to observe this, I will repeat echocardiogram in 6 months but for follow-up of her dyspnea and marantic endocarditis.  Patient does have ILD and will follow up if she were to develop any pulmonary hypertension as well.  She has not had any chest pain, she is diabetic and also has coronary calcification, she is on a statin, and has had a lot of stress test in 2018.  I reviewed external labs, excellent control of lipids.  She does have uncontrolled diabetes and stage III chronic kidney disease that has remained stable as well.  I will see her back in 3 months for follow-up.  This will be done after the echocardiogram.    Adrian Prows, MD, [facility] 10/17/2020, 1:54 PM Office: [phone]

## 2020-11-08 ENCOUNTER — Other Ambulatory Visit: Payer: Self-pay

## 2020-11-08 ENCOUNTER — Ambulatory Visit
Admission: RE | Admit: 2020-11-08 | Discharge: 2020-11-08 | Disposition: A | Discharge status: Home / Self Care | Payer: Medicare Other | Source: Ambulatory Visit | Attending: Internal Medicine | Admitting: Internal Medicine

## 2020-11-08 DIAGNOSIS — R0609 Other forms of dyspnea: Secondary | ICD-10-CM

## 2020-11-08 DIAGNOSIS — J849 Interstitial pulmonary disease, unspecified: Secondary | ICD-10-CM

## 2020-11-08 DIAGNOSIS — M359 Systemic involvement of connective tissue, unspecified: Secondary | ICD-10-CM

## 2020-11-08 DIAGNOSIS — R0989 Other specified symptoms and signs involving the circulatory and respiratory systems: Secondary | ICD-10-CM

## 2020-11-15 ENCOUNTER — Other Ambulatory Visit: Payer: Self-pay

## 2020-11-15 ENCOUNTER — Ambulatory Visit (INDEPENDENT_AMBULATORY_CARE_PROVIDER_SITE_OTHER): Payer: Medicare Other

## 2020-11-15 ENCOUNTER — Ambulatory Visit
Admission: EM | Admit: 2020-11-15 | Discharge: 2020-11-15 | Disposition: A | Discharge status: Home / Self Care | Payer: Medicare Other | Attending: Internal Medicine | Admitting: Internal Medicine

## 2020-11-15 DIAGNOSIS — R059 Cough, unspecified: Secondary | ICD-10-CM

## 2020-11-15 DIAGNOSIS — R051 Acute cough: Secondary | ICD-10-CM

## 2020-11-15 DIAGNOSIS — R5383 Other fatigue: Secondary | ICD-10-CM | POA: Diagnosis not present

## 2020-11-15 DIAGNOSIS — R0602 Shortness of breath: Secondary | ICD-10-CM

## 2020-11-15 MED ORDER — BENZONATATE 100 MG PO CAPS: 100.0000 mg | ORAL_CAPSULE | Freq: Three times a day (TID) | ORAL | 0 refills | Status: DC | PRN

## 2020-11-15 NOTE — ED Provider Notes (Signed)
[facility]    CSN: [id number] Arrival date & time: 11/15/20  1409      History   Chief Complaint Chief Complaint  Patient presents with   Cough    HPI [name] is a 80 y.o. female.   Patient presents with productive cough that has been present for approximately 2 weeks.  Cough is productive with clear sputum.  Patient also endorses shortness of breath but denies chest pain.  Patient has baseline shortness of breath per pulmonology note but states that this "feels different".  Patient has also felt very fatigued lately.  Denies any fevers or known sick contacts.  Denies any upper respiratory symptoms.  Patient does have history of interstitial lung disease.  Denies chest pain.   Cough  Past Medical History:  Diagnosis Date   Breast cancer ([place])    Diabetes mellitus without complication ([place])    Hypertension    Hypothyroidism    Lupus ([place])     Patient Active Problem List   Diagnosis Date Noted   Acute cystitis 02/20/2019   Near syncope 02/18/2019   Hyponatremia 02/18/2019   ILD (interstitial lung disease) ([place]) 07/30/2016   Dyspnea 07/16/2016   Bibasilar crackles 07/16/2016   History of systemic lupus erythematosus (SLE) ([place]) 07/16/2016   Cough 07/16/2016   Encounter for monitoring ACE-inhibitor therapy 07/16/2016   History of asthma 07/16/2016   Fever, unspecified    Hypothyroidism 01/03/2015   HTN (hypertension) 01/03/2015   Drug-induced hypersensitivity reaction 01/03/2015   Lupus ([place]) 01/03/2015   Pyrexia 01/02/2015    Past Surgical History:  Procedure Laterality Date   BREAST LUMPECTOMY  2009    OB History   No obstetric history on file.      Home Medications    Prior to Admission medications   Medication Sig Start Date End Date Taking? Authorizing Provider  benzonatate (TESSALON) 100 MG capsule Take 1 capsule (100 mg total) by mouth every 8 (eight) hours as needed for cough. 11/15/20  Yes Naavya Postma, Michele Rockers, FNP  albuterol  (VENTOLIN HFA) 108 (90 Base) MCG/ACT inhaler Inhale 2 puffs into the lungs every 4 (four) hours as needed for wheezing or shortness of breath. 04/15/20   Adrian Prows, MD  capsaicin (ZOSTRIX) 0.025 % cream Apply topically 3 (three) times daily as needed (for pain).  09/16/18   [provider]  ferrous sulfate 324 MG TBEC Take 324 mg by mouth.    [provider]  gabapentin (NEURONTIN) 300 MG capsule Take 300 mg by mouth 2 (two) times daily.    [provider]  glimepiride (AMARYL) 2 MG tablet Take 2 mg by mouth daily. 02/09/19   [provider]  hydroxychloroquine (PLAQUENIL) 200 MG tablet Take 1 tablet (200 mg total) by mouth daily. 01/06/15   Kelvin Cellar, MD  levothyroxine (SYNTHROID, LEVOTHROID) 75 MCG tablet Take 75 mcg by mouth daily before breakfast.  02/13/18   [provider]  losartan-hydrochlorothiazide (HYZAAR) 50-12.5 MG tablet Take 1 tablet by mouth daily.    [provider]  omeprazole (PRILOSEC) 20 MG capsule Take 20 mg by mouth daily.    [provider]  Tiotropium Bromide Monohydrate (SPIRIVA RESPIMAT) 1.25 MCG/ACT AERS Inhale 2 puffs into the lungs daily. Patient not taking: Reported on 09/15/2019 08/17/19 11/29/19  Brand Males, MD    Family History Family History  Problem Relation Age of Onset   Cancer Father        ? type   Asthma Brother  Social History Social History   Tobacco Use   Smoking status: Never   Smokeless tobacco: Former    Types: Snuff    Quit date: 02/03/2015  Vaping Use   Vaping Use: Never used  Substance Use Topics   Alcohol use: No   Drug use: No     Allergies   Ciprofloxacin and Crestor [rosuvastatin]   Review of Systems Review of Systems Per HPI  Physical Exam Triage Vital Signs ED Triage Vitals [11/15/20 1450]  Enc Vitals Group     BP 113/67     Pulse Rate 98     Resp 18     Temp 98.4 F (36.9 C)     Temp Source Oral     SpO2 98 %     Weight      Height       Head Circumference      Peak Flow      Pain Score 0     Pain Loc      Pain Edu?      Excl. in [place]?    No data found.  Updated Vital Signs BP 113/67 (BP Location: Left Arm)   Pulse 98   Temp 98.4 F (36.9 C) (Oral)   Resp 18   SpO2 98%   Visual Acuity Right Eye Distance:   Left Eye Distance:   Bilateral Distance:    Right Eye Near:   Left Eye Near:    Bilateral Near:     Physical Exam Constitutional:      General: She is not in acute distress.    Appearance: Normal appearance. She is not toxic-appearing or diaphoretic.  HENT:     Head: Normocephalic and atraumatic.     Right Ear: Tympanic membrane and ear canal normal.     Left Ear: Tympanic membrane and ear canal normal.     Nose: Nose normal.     Mouth/Throat:     Mouth: Mucous membranes are moist.     Pharynx: No posterior oropharyngeal erythema.  Eyes:     Extraocular Movements: Extraocular movements intact.     Conjunctiva/sclera: Conjunctivae normal.     Pupils: Pupils are equal, round, and reactive to light.  Cardiovascular:     Rate and Rhythm: Normal rate and regular rhythm.     Pulses: Normal pulses.     Heart sounds: Normal heart sounds.  Pulmonary:     Effort: Pulmonary effort is normal. No respiratory distress.     Breath sounds: Normal breath sounds.     Comments: Crackles present in bilateral lower lung fields Neurological:     General: No focal deficit present.     Mental Status: She is alert and oriented to person, place, and time. Mental status is at baseline.  Psychiatric:        Mood and Affect: Mood normal.        Behavior: Behavior normal.        Thought Content: Thought content normal.        Judgment: Judgment normal.     UC Treatments / Results  Labs (all labs ordered are listed, but only abnormal results are displayed) Labs Reviewed - No data to display  EKG   Radiology DG Chest 2 View  Result Date: 11/15/2020 CLINICAL DATA:  Persistent cough, short of breath,  fatigue EXAM: CHEST - 2 VIEW COMPARISON:  01/04/2015, 11/08/2020 FINDINGS: Frontal and lateral views of the chest demonstrate an unremarkable cardiac silhouette. Basilar predominant scarring and fibrosis again  noted unchanged since recent CT. No airspace disease, effusion, or pneumothorax. Soft tissue calcification within the left breast again seen. No acute bony abnormalities. IMPRESSION: 1. Basilar predominant scarring and fibrosis. No acute airspace disease. Electronically Signed   By: Randa Ngo M.D.   On: 11/15/2020 15:30    Procedures Procedures (including critical care time)  Medications Ordered in UC Medications - No data to display  Initial Impression / Assessment and Plan / UC Course  I have reviewed the triage vital signs and the nursing notes.  Pertinent labs & imaging results that were available during my care of the patient were reviewed by me and considered in my medical decision making (see chart for details).     Chest x-ray was negative for any acute cardiopulmonary process.  X-ray seems consistent with most recent CT scan indicating pulmonary fibrosis.  Suspect that crackle sounds in lungs are consistent with chronic pulmonary fibrosis.  Patient was advised that she needs to follow-up with pulmonology for further evaluation and management.  Benzonatate prescribed to take as needed for cough.  No signs of superimposed infection.  Vital signs stable at discharge and oxygen levels were normal.  Stable for discharge.  Patient was agreeable to following up with pulmonology. Do not think COVID-19 testing is necessary given duration of symptoms.Discussed strict return precautions. Patient verbalized understanding and is agreeable with plan.  Final Clinical Impressions(s) / UC Diagnoses   Final diagnoses:  Acute cough     Discharge Instructions      You have been prescribed cough medication to take as needed for cough.  Your chest x-ray was negative for any acute  abnormalities.  Please follow-up with pulmonologist today for further evaluation and management.     ED Prescriptions     Medication Sig Dispense Auth. Provider   benzonatate (TESSALON) 100 MG capsule Take 1 capsule (100 mg total) by mouth every 8 (eight) hours as needed for cough. 21 capsule [place], Michele Rockers, [place]      PDMP not reviewed this encounter.   Teodora Medici, [place] 11/15/20 [phone]

## 2020-11-15 NOTE — ED Triage Notes (Signed)
Pt c/o cough, SOB, fatigue. Denies sore throat, nasal congestion, body aches or chills, nausea, vomiting, diarrhea, constipation. Onset about 2 weeks ago.

## 2020-11-15 NOTE — Discharge Instructions (Addendum)
You have been prescribed cough medication to take as needed for cough.  Your chest x-ray was negative for any acute abnormalities.  Please follow-up with pulmonologist today for further evaluation and management.

## 2020-12-05 ENCOUNTER — Ambulatory Visit: Payer: Medicare Other | Admitting: Cardiology

## 2020-12-05 ENCOUNTER — Other Ambulatory Visit: Payer: Self-pay

## 2020-12-05 ENCOUNTER — Encounter: Payer: Self-pay | Admitting: Cardiology

## 2020-12-05 VITALS — BP 99/59 | HR 79 | Temp 97.9°F | Resp 16 | Ht 67.0 in | Wt 155.8 lb

## 2020-12-05 DIAGNOSIS — R42 Dizziness and giddiness: Secondary | ICD-10-CM

## 2020-12-05 DIAGNOSIS — R002 Palpitations: Secondary | ICD-10-CM

## 2020-12-05 DIAGNOSIS — R0602 Shortness of breath: Secondary | ICD-10-CM

## 2020-12-05 NOTE — Progress Notes (Signed)
Primary Physician/Referring:  Carol Ada, MD  Patient ID: [name], female    DOB: [birth year], 80 y.o.   MRN: [id number]  Chief Complaint  Patient presents with   Tachycardia   Shortness of Breath   Dizziness        HPI:    [name]  is a 80 y.o. 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    Patient discontinued Xarelto 3 months ago after shared decision making, she is presently doing well and has not had any worsening dyspnea, [name], dizziness and also rapid heartbeat.   Past Medical History:  Diagnosis Date   Breast cancer ([place])    Diabetes mellitus without complication ([place])    Hypertension    Hypothyroidism    Lupus ([place])    Past Surgical History:  Procedure Laterality Date   BREAST LUMPECTOMY  2009   Family History  Problem Relation Age of Onset   Cancer Father        ? type   Asthma Brother     Social History   Tobacco Use   Smoking status: Never   Smokeless tobacco: Former    Types: Snuff    Quit date: 02/03/2015  Substance Use Topics   Alcohol use: No   Marital Status: Single  ROS  Review of Systems  Cardiovascular:  Positive for dyspnea on exertion (stable). Negative for chest pain and leg swelling.  Respiratory:  Positive for wheezing.   Musculoskeletal:  Positive for arthritis and back pain.  Gastrointestinal:  Negative for melena.  Objective  Blood pressure (!) 99/59, pulse 79, temperature 97.9  F (36.6 C), temperature source Temporal, resp. rate 16, height 5' 7"  (1.702 m), weight 155 lb 12.8 oz (70.7 kg), SpO2 99 %.  Vitals with BMI 12/05/2020 12/05/2020 12/05/2020  Height - - -  Weight - - -  BMI - - -  Systolic 99 128 786  Diastolic 59 66 69  Pulse 79 63 56     Physical Exam Neck:     Vascular: No carotid bruit or JVD.  Cardiovascular:     Rate and Rhythm: Normal rate and regular rhythm.     Pulses: Intact distal pulses.     Heart sounds: Normal heart sounds. No murmur heard.   No gallop.  Pulmonary:     Effort: Pulmonary effort is normal.     Breath sounds: Rales (coarse bilateral basal leathery crackles) present.  Abdominal:     General: Bowel sounds are normal.     Palpations: Abdomen is soft.  Musculoskeletal:        General: No swelling.     Cervical back: Neck supple.  Skin:    Capillary Refill: Capillary refill takes less than 2 seconds.  Neurological:     Mental Status: [name], place, and time.  Psychiatric:        Mood and Affect: Mood normal.   Laboratory examination:   No results for input(s): NA, K, CL, CO2, GLUCOSE, BUN, CREATININE,  CALCIUM, GFRNONAA, GFRAA in the last 8760 hours. CrCl cannot be calculated (Patient's most recent lab result is older than the maximum 21 days allowed.).  CMP Latest Ref Rng & Units 02/20/2019 02/18/2019 02/17/2019  Glucose 70 - 99 mg/dL 121(H) 50(L) 63(L)  BUN 8 - 23 mg/dL 13 13 13   Creatinine 0.44 - 1.00 mg/dL 1.13(H) 1.23(H) 1.34(H)  Sodium 135 - 145 mmol/L 131(L) 126(L) 121(L)  Potassium 3.5 - 5.1 mmol/L 4.5 4.4 3.6  Chloride 98 - 111 mmol/L 97(L) 94(L) 88(L)  CO2 22 - 32 mmol/L 24 22 23   Calcium 8.9 - 10.3 mg/dL 9.4 9.2 9.1  Total Protein 6.5 - 8.1 g/dL 7.8 8.0 -  Total Bilirubin 0.3 - 1.2 mg/dL 0.5 0.3 -  Alkaline Phos 38 - 126 U/L 50 57 -  AST 15 - 41 U/L 23 27 -  ALT 0 - 44 U/L 18 19 -   CBC Latest Ref Rng & Units 02/20/2019 02/18/2019 02/17/2019  WBC 4.0 - 10.5 K/uL 4.8 5.7 5.7   Hemoglobin 12.0 - 15.0 g/dL 11.3(L) 12.2 12.7  Hematocrit 36.0 - 46.0 % 33.3(L) 36.8 37.5  Platelets 150 - 400 K/uL 212 225 238   TSH No results for input(s): TSH in the last 8760 hours.  External labs:  Labs 06/25/2020:  A1c 7.5%.  Hb 11.3/HCT 33.7, platelets 234.  Normal indicis.  Serum glucose 122, BUN 13, creatinine 1.20, EGFR 46 mL, potassium 4.3, CMP otherwise normal.  Total cholesterol 104, triglycerides 113, HDL 37, LDL 67.  Non-HDL cholesterol 113.  Medications and allergies   Allergies  Allergen Reactions   Ciprofloxacin Itching, Swelling and Other (See Comments)    Possibly causing tremors?   Crestor [Rosuvastatin] Other (See Comments)    Myalgia and back pain    Outpatient Medications Prior to Visit  Medication Sig Dispense Refill   albuterol (VENTOLIN HFA) 108 (90 Base) MCG/ACT inhaler Inhale 2 puffs into the lungs every 4 (four) hours as needed for wheezing or shortness of breath. 18 g 3   capsaicin (ZOSTRIX) 0.025 % cream Apply topically 3 (three) times daily as needed (for pain).      ferrous sulfate 324 MG TBEC Take 324 mg by mouth.     gabapentin (NEURONTIN) 300 MG capsule Take 300 mg by mouth 2 (two) times daily.     glimepiride (AMARYL) 2 MG tablet Take 2 mg by mouth daily.     hydroxychloroquine (PLAQUENIL) 200 MG tablet Take 1 tablet (200 mg total) by mouth daily. 30 tablet 0   levothyroxine (SYNTHROID, LEVOTHROID) 75 MCG tablet Take 75 mcg by mouth daily before breakfast.      omeprazole (PRILOSEC) 20 MG capsule Take 20 mg by mouth daily.     losartan-hydrochlorothiazide (HYZAAR) 50-12.5 MG tablet Take 1 tablet by mouth daily.     benzonatate (TESSALON) 100 MG capsule Take 1 capsule (100 mg total) by mouth every 8 (eight) hours as needed for cough. 21 capsule 0   No facility-administered medications prior to visit.     Radiology:   CT head without contrast and CT cervical spine without contrast 02/18/2019: 1. No acute intracranial pathology. 2.  No acute/traumatic cervical spine pathology. Multilevel degenerative changes most prominent at C4-C5.  HRCT chest 07/29/2019: 1. The appearance of the lungs is compatible with interstitial lung disease, with a spectrum of findings considered diagnostic of usual interstitial pneumonia (UIP) per current ATS guidelines. No significant progression of disease compared to the prior  examination. 2. Aortic atherosclerosis, in  addition to left main and 3 vessel coronary artery disease. Assessment for potential risk factor modification, dietary therapy or pharmacologic therapy may be warranted, if clinically indicated. 3. Aortic Atherosclerosis (ICD10-I70.0).  Cardiac Studies:   Lexiscan myoview stress test 07/03/2016: 1. The resting electrocardiogram demonstrated normal sinus rhythm, normal resting conduction, PVC and normal rest repolarization. Stress EKG is non-diagnostic for ischemia as it a pharmacologic stress using Lexiscan. Stress symptoms included dyspnea. 2. Myocardial perfusion imaging is normal. Overall left ventricular systolic function was normal without regional wall motion abnormalities. The left ventricular ejection fraction was 75%.   Carotid artery duplex  02/18/2019: Right Carotid: Velocities in the right ICA are consistent with a 1-39% stenosis. Left Carotid: Velocities in the left ICA are consistent with a 1-39% stenosis. Vertebrals:  Bilateral vertebral arteries demonstrate antegrade flow. Subclavians: Normal flow hemodynamics were seen in bilateral subclavian arteries.  Echocardiogram 03/21/2020: Left ventricle cavity is normal in size. Moderate concentric hypertrophy of the left ventricle. Normal global wall motion. Normal LV systolic function with EF 66%. Doppler evidence of grade I (impaired) diastolic dysfunction, normal LAP.  Trileaflet aortic valve.  Trace aortic regurgitation. Focal echodensity at the tip of anterior mitral leaflet could represent calcification or healed  vegetation. Mild (Grade I) mitral regurgitation. Mild tricuspid regurgitation.  Mild pulmonic regurgitation. No evidence of pulmonary hypertension. No significant change compared to previous study on 09/01/2019  PCV ECHOCARDIOGRAM COMPLETE 10/10/2020  Narrative Echocardiogram 10/10/2020: Normal LV systolic function with visual EF 60-65%. Left ventricle cavity is normal in size. Normal left ventricular wall thickness. Normal global wall motion. Normal diastolic filling pattern, normal LAP. Native mitral valve with focal echodensity at the tip of anterior mitral leaflet could represent calcification or healed vegetation cannot be excluded (noted on prior study). No mitral regurgitation or stenosis. Mild tricuspid regurgitation. No evidence of pulmonary hypertension. RVSP measures 30 mmHg. Mild pulmonic regurgitation. Compared to study 03/21/2020 no significant change.  EKG  EKG 12/05/2020: Normal sinus rhythm at rate of 64 bpm, left axis deviation, poor R wave progression, cannot exclude anteroseptal infarct old.  Nonspecific T abnormality.  Baseline artifact.  No significant change from 10/17/2020 04/15/2020.  Assessment     ICD-10-CM   1. Shortness of breath  R06.02 EKG 12-Lead    2. Dizziness  R42     3. Palpitations  R00.2       Medications Discontinued During This Encounter  Medication Reason   benzonatate (TESSALON) 100 MG capsule Error   losartan-hydrochlorothiazide (HYZAAR) 50-12.5 MG tablet Side effect (s)   No orders of the defined types were placed in this encounter.  Recommendations:   Deshante Cassell is a 80 y.o. 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    Patient discontinued Xarelto 3 months ago after shared decision making, she is presently doing well and has not had any worsening dyspnea, has not had any TIA-like symptoms.  She has not had any recent hospitalization.  No change in physical exam.  I had seen her 2 months ago but she presented back stating that she was having shortness of breath, dizziness and also rapid heartbeat.  Her blood pressure has been  very low.  I suspect her symptoms of near syncope, dizziness and fatigue are all related to low blood pressure.  Patient also has been monitoring her blood pressure and it has always been normal or low.  I will discontinue losartan HCT for now.  Her symptoms should improve over time.  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 I will see her back as previously scheduled in 6 months from now or sooner if there is any problem.   Adrian Prows, MD, [facility] 12/05/2020, 11:26 AM Office: [phone]

## 2020-12-30 ENCOUNTER — Other Ambulatory Visit: Payer: Self-pay | Admitting: Internal Medicine

## 2020-12-31 LAB — SARS CORONAVIRUS 2 (TAT 6-24 HRS): SARS Coronavirus 2: NEGATIVE

## 2021-01-02 ENCOUNTER — Ambulatory Visit: Payer: Medicare Other | Admitting: Internal Medicine

## 2021-02-14 ENCOUNTER — Encounter: Payer: Self-pay | Admitting: Internal Medicine

## 2021-02-14 ENCOUNTER — Ambulatory Visit (INDEPENDENT_AMBULATORY_CARE_PROVIDER_SITE_OTHER): Payer: Medicare Other | Admitting: Internal Medicine

## 2021-02-14 ENCOUNTER — Other Ambulatory Visit: Payer: Self-pay

## 2021-02-14 VITALS — BP 126/70 | HR 64 | Temp 97.9°F | Ht 67.0 in | Wt 152.4 lb

## 2021-02-14 DIAGNOSIS — J849 Interstitial pulmonary disease, unspecified: Secondary | ICD-10-CM | POA: Diagnosis not present

## 2021-02-14 DIAGNOSIS — R0609 Other forms of dyspnea: Secondary | ICD-10-CM

## 2021-02-14 DIAGNOSIS — R0989 Other specified symptoms and signs involving the circulatory and respiratory systems: Secondary | ICD-10-CM

## 2021-02-14 DIAGNOSIS — M359 Systemic involvement of connective tissue, unspecified: Secondary | ICD-10-CM

## 2021-02-14 LAB — PULMONARY FUNCTION TEST
DL/VA % pred: 70 %
DL/VA: 2.8 ml/min/mmHg/L
DLCO cor % pred: 47 %
DLCO cor: 9.78 ml/min/mmHg
DLCO unc % pred: 47 %
DLCO unc: 9.78 ml/min/mmHg
FEF 25-75 Pre: 1.69 L/sec
FEF2575-%Pred-Pre: 106 %
FEV1-%Pred-Pre: 82 %
FEV1-Pre: 1.54 L
FEV1FVC-%Pred-Pre: 110 %
FEV6-%Pred-Pre: 79 %
FEV6-Pre: 1.84 L
FEV6FVC-%Pred-Pre: 104 %
FVC-%Pred-Pre: 76 %
FVC-Pre: 1.84 L
Pre FEV1/FVC ratio: 84 %
Pre FEV6/FVC Ratio: 100 %

## 2021-02-14 NOTE — Progress Notes (Signed)
PCP Kathryn Mccreedy, MD  HPI  IOV 07/16/2016  Chief Complaint  Patient presents with   Shortness of Breath    consult for sob referring doctor is Dr. Einar [name] patient states that she is very sob and using the inhaler helps her very much     According to the referral chart from Dr. Einar [name], he'll originally saw her on 06/26/2016 for shortness of breath at follow-up. Patient reported to him that she's had insidious onset of shortness of breath for several years with progression in the last several months associated with bilateral chest tightness. Apparently patient was given a diagnosis of asthma and bronchodilator use does help her. Per the patient history spirometry testing outside was abnormal. Dyspnea is significant enough patient not able to do her household work. There is associated fatigue. A trial of diuresis by the primary care physician did help the shortness of breath. Patient's symptoms are associated with lisinopril intake for hypertension.  exhaled nitric oxide test today in the office is 19 ppb and normal.  IgE. ''Walking desaturation test on 07/16/2016 185 feet x 3 laps on room air forehead probe:  did NOT desaturate. Rest pulse ox was 100%, final pulse ox was 100%. HR response 75/min at rest to 85/min at peak exertion. 2-D echocardiogram 02/21/2016 as reviewed from the outside chart shows mild concentric left ventricular remodeling with ejection fraction 79. Normal right ventricle size and contractility. No evidence of diastolic dysfunction. No pericardial effusion    In talking to the patient and her daughter [name] the above information was confirmed. In addition she is on Advair discus for the last few months because of the above problems. It is helping her. She takes it as needed which ends up being every other day once. Today the exhaled nitric oxide was normal 16 ppb which is essentially on Advair. She is also on lisinopril for the last few years. She does have  associated mild cough. Evaluation by cardiology did show crackles in the concern is he has interstitial lung disease therefore she is referred here. Associated with concern of interstitial lung disease is a history of lupus that has been present for many years. When she relocated to [facility] few years ago with a primary care physician has been following this. Patient is on plaquenil but not steroids The family is unsure about SLE blood work  Results for [name] (MRN [id number]) as of 07/16/2016 10:45  Ref. Range 01/05/2015 09:31 01/12/2015 16:07 01/12/2015 19:45 01/12/2015 20:00 01/12/2015 20:02  Hemoglobin Latest Ref Range: 12.0 - 15.0 g/dL 10.4 (L) 11.9 (L)        OV 07/30/2016  Chief Complaint  Patient presents with   Follow-up    Pt was here for a PFT but was unable to complete it. Pt continues to be SOB with some improvement, occ. dry cough.   Here to follow-up for pulmonary test results for interstitial lung disease workup. In terms of test results her ESR is high ANA is positive and her Sjogren's antibodies positive but lupus antibodies and rheumatoid factor antibodies are negative. Her CT scan does show interstitial lung disease at the base. She could not complete pulmonary function test due to inability to follow direction but I suspect the disease is mild because of walking desaturation test last visit was mild. Her daughter and she appeared to understand these test results. She reports dyspnea and cough. She is on ACE inhibitor.    OV 02/02/2017  Chief Complaint  Patient  presents with   Follow-up    Pt states she  has been doing good since last visit. Denies any complaints of cough, SOB, or CP.   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  In the interim she has no complaints.  She only has mild dyspnea on exertion.  Walking desaturation test on 185 feet x3 laps on room air.  Resting pulse ox 100%.   Final pulse ox 97%.  Resting heart rate was 70/min.  Final heart rate was 87/min.  She did see a rheumatologist Dr. Gavin Pound in practice last month.  I reviewed the note.  It appears that they are not finding any other systemic evidence of autoimmune disease.  Therefore she is still maintained on Plaquenil.  According to the notes patient is also Smith and RNP antibody positive.  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  Most recent CT scan of the chest in summer 2018 as a possible/probable UIP.  Results for [name] (MRN [id number]) as of 07/30/2016 10:00  Ref. Range 07/16/2016 12:09 07/16/2016 12:18  Nitric Oxide Unknown  16  Sed Rate Latest Ref Range: 0 - 30 mm/hr 77 (H)   Anit Nuclear Antibody(ANA) Latest Ref Range: NEGATIVE  POS (A)   ANA Pattern 1 Unknown NUCLEOLAR (A)   ANA Titer 1 Latest Units: titer 2:094 (H)   Cyclic Citrullin Peptide Ab Latest Units: Units <16   ds DNA Ab Latest Units: IU/mL 1   RA Latex Turbid. Latest Ref Range: <14 IU/mL <14   SSA (Ro) (ENA) Antibody, IgG Latest Ref Range: <1.0 NEG AI  >8.0 POS (H)   SSB (La) (ENA) Antibody, IgG Latest Ref Range: <1.0 NEG AI  <1.0 NEG   Scleroderma (Scl-70) (ENA) Antibody, IgG Latest Ref Range: <1.0 NEG AI  <1.0 NEG     IMPRESSION: CT CHEST HIGH RESOLUTION 1. 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 Differential includes fibrotic nonspecific interstitial pneumonia (NSIP). Follow-up high-resolution chest CT in 12 months would be useful to assess temporal pattern stability, as clinically warranted. 2. Solitary 3 mm solid right upper lobe pulmonary nodule. No follow-up needed if patient is low-risk. Non-contrast chest CT can be considered in 12 months if patient is high-risk. This recommendation follows the consensus statement: Guidelines for Management of  Incidental Pulmonary Nodules Detected on CT Images: From the Fleischner Society 2017; Radiology 2017; 284:228-243. 3. Left main and 3 vessel coronary atherosclerosis. 4. Small hiatal hernia. 5. Diffuse hepatic steatosis.   Aortic Atherosclerosis (ICD10-I70.0) and Emphysema (ICD10-J43.9).     Electronically Signed   By: [name] M.D.   On: 07/22/2016 14:17  OV 05/18/2017  Chief Complaint  Patient presents with   Follow-up    PFT attempted today but pt was unable to complete as per Ria Comment pt did not want to do the PFT.  HRCT done 1/21.  Pt states she has been doing well. Breathing is about the same as last visit. Denies any complaints or concerns.    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  In the interim overall she is stable.  She is an extremely poor historian.  After asking her multiple times in different ways she believes earlier this year she had a cardiac stress test for a coronary artery calcification by Dr. Einar [name] and  this was normal.  She is completely unaware of the fact that she has interstitial lung disease.  She believes she has asthma but when I questioned her and directed her that I have explained to her that she has pulmonary fibrosis she then admitted to the in fact she understands that she has pulmonary fibrosis.  Overall she is stable she has mild dyspnea on exertion without any cough.  She thinks she had lupus diagnosed by a rheumatologist in [facility] but when I told her that there is no systemic evidence of autoimmune disease when she saw [facility] rheumatology she seems surprised that she even made that visit.  There are no new issues.   Walking desaturation test on 05/18/2017 185 feet x 3 laps on ROOM AIR:  did not have symptioms or  desaturate. Rest pulse ox was 100%, final pulse ox was 95%. HR response 69/min at rest to 90/min at peak exertion. Patient [name]  Did not Desaturate  < 88% . [name] yes did  Desaturated </= 3% points. [name] yes did get tachyardic   IMPRESSION: Lungs/Pleura: No pneumothorax. No pleural effusion. Posterior right upper lobe 3 mm solid pulmonary nodule (series 5/image 39), stable since 07/22/2016 chest CT. No acute consolidative airspace disease, lung masses or new significant pulmonary nodules. No significant air trapping on the expiration sequence. There is patchy confluent subpleural reticulation and ground-glass attenuation in both lungs with associated mild traction bronchiectasis and minimal architectural distortion. There is mild honeycombing throughout both lung bases. Findings have not convincingly progressed since 07/22/2016 chest CT. 1. No appreciable interval progression of basilar predominant fibrotic interstitial lung disease with mild honeycombing in the intervening 6 months. Findings are still considered to represent probable usual interstitial pneumonia (UIP), with the differential including fibrotic phase nonspecific interstitial pneumonia (NSIP). Follow-up high-resolution chest CT recommended in 12 months to assess ongoing temporal pattern stability. 2. Tiny right upper lobe pulmonary nodule is stable for 6 months and probably benign. 3. Left main and 3 vessel coronary atherosclerosis. 4. Small hiatal hernia.   Aortic Atherosclerosis (ICD10-I70.0).     Electronically Signed   By: [name] M.D.   On: 02/15/2017 16:05   OV 08/18/2017  Chief Complaint  Patient presents with   Follow-up    PFT attempted today but pt was unable to complete.  Pt states she is about the same since last visit. Denies any real complaints of SOB and denies any real complaints.    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 Immunomodulators of probably due to toxic  follow-up. Moreover her ILD appears stable over time. Therefore we decided to treat her with  Spiriva. She is here for follow-up she tells me she is feeling better. She likes her Spiriva. This is actually helping her shortness of breath. Walking desaturation test is essentially normal. She had a pulmonary function test today but she could not follow through with instructions. There are no other new issues. She denies any ER visits   OV 02/15/2018  Subjective:  Patient ID: [name], [name] , DOB: Jan 14, 1941 , age 52 y.o. , MRN: [id number] , ADDRESS: [place] [id number]   02/15/2018 -   Chief Complaint  Patient presents with   Follow-up    Pt states she has been doing well since last visit. States she does become SOB when  she exerts herself and states she does have an occ. dry cough. Denies any complaints of CP/chest tightness.    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 -last seen April 2019.  Is a routine follow-up for the above issues.  She expresses that she has dry eyes and dry mouth.  She again does not recollect seeing a rheumatologist.  She is on supportive care but she is on Plaquenil.  Therefore she is probably seeing a rheumatologist.  She is unable to do pulmonary function test therefore she did a walk test with [place] and it is stable and documented below she also had high-resolution CT chest that I visualized as documented below.  She has UIP pattern but it is stable and a year and a half now.  She has no worsening dyspnea.  No interim complaints.  She does have coronary artery calcification on the CT scan but she says she saw Dr. Einar [name] approximately a year ago and a stress test  was normal.  She continues with Spiriva for the associated emphysema from remote smoking.  This is helping her.  We discussed about taking anti-fibrotic's which would be indicated if this was IPF but because she has autoimmune features it would be indicated only if she got worse.  In either event she seems somewhat nervous about taking a drug that has GI side effect potential.  She wants to wait and watch.     OV 06/14/2019  Subjective:  Patient ID: [name], [name] , [name] , age 61 y.o. , MRN: [id number] , ADDRESS: [address] [place] [address] PCP Kathryn Mccreedy, MD Cards- Dr [name] Rheum - Dr Gavin Pound  06/14/2019 -   Chief Complaint  Patient presents with   Follow-up    Pt states she has been doing good since last visit and denies any complaints.   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 -presents for follow-up.  Not seen her in over a year because of the COVID-19 pandemic.  She tells me that steadily and slowly in the last 1 year her shortness of breath with exertion has progressed.  Her effort tolerance is lower.  She states that walking in our office she felt more dyspneic than in the past.  In fact walking in our office her pulse ox dropped 8 points.  This is a change for her.  She does not want to do pulmonary function test because of technical challenges with her doing PFTs.  She prefers other modes of monitoring.  This would include CT scan of the chest.  At last visit she did not want entertain the idea of antifibrotic therapy.  She preferred supportive care but now that she is declining she is more open to the idea.  Her symptom scores are listed below.  In the interim she did get admitted to the hospital in January  2021 with hyponatremia.  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  There is mention of intact RV function but no mention of pulmonary artery pressures this is based on review of the chart.  Labs in January 2021 shows a creatinine 1.13 mg percent with a GFR of 47 and a hemoglobin of 11.3 g%.. She was evaluated by Dr. Einar [name].  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   OV 08/17/2019   Subjective:  Patient ID: [name], [name] , DOB: 10-08-1940, age 61 y.o. years. , MRN: [id number],  ADDRESS: [place] [address] PCP  Kathryn Mccreedy, MD Providers : Treatment Team:  Attending Provider: Brand Males, MD  Follow-up IPAF (UIP with autoimmune serology; June 2018 ANA 1:320, SSA > 8, dry eyes/mount + eSR 71)     - on supportive care due to goals of care (could not do PFT technically 2019) -   Follow-up mild associated emphysema on CT scan   - on spiriva  Chief Complaint  Patient presents with   Follow-up    pt states she has been doing okay since last visit. States that her breathing has become a little worse. Pt denies any complaints of cough, chest tightness, or wheezing.       HPI [name] 81 y.o. -presents for follow-up.  Last seen in May 2021 at the time she is complaining of more shortness of breath.  So we did overnight oxygen study.  This was done in June 2021.  No desaturations.  No oxygen needed.  Today she presents again and she tells me that she has shortness of breath with exertion relieved by rest.  Similar to slightly worse compared to before as documented below.  We did a high-resolution CT chest with is unchanged.  Is consistent with UIP.  She tells me that Spiriva helped her in the past.  However there is no air-trapping emphysema mentioned in  the current CT chest.  2020 January CT chest but does mention some air trapping.  But there is no emphysema she never smoked.  She wants to try Spiriva again.  She has upcoming visit with her cardiologist in August 2021 associated with echocardiogram.  There are no other new issues.    SYMPTOM SCALE - ILD 06/14/2019  08/17/2019   O2 use ra ra  Shortness of Breath 0 -> 5 scale with 5 being worst (score 6 If unable to do)   At rest 0 2  Simple tasks - showers, clothes change, eating, shaving 1 2  Household (dishes, doing bed, laundry) 3 3  Shopping 5 4  Walking level at own pace 3 3  Walking up Stairs 5 5  Total (30-36) Dyspnea Score 17 19  How bad is your cough? 2   How bad is your fatigue 3   How bad is nausea 00   How bad is vomiting?  0   How bad is diarrhea? 0   How bad is anxiety? 0 0  How bad is depression 0 1         Simple office walk 185 feet x  3 laps goal with forehead probe 08/18/2017    02/15/2018  06/14/2019   O2 used *room air Room air ra  Number laps completed _0 Comments about pace  Normal pace Slow pace Slow pace  Resting Pulse Ox/HR 100% and 67/min 100% and 73/min 100% and 63/mi  Final Pulse Ox/HR 100% and 91/min 100% and 96/min 92% and 84/mi  Desaturated </= 88% no No  no  Desaturated <= 3% points no no Yues, 8 ponts  Got Tachycardic >/= 90/min yes yes no  Symptoms at end of test Mild dyspnea Mild dyspnea Mild dyspnea  Miscellaneous comments x x       Results for AMBRA, HAVERSTICK (MRN [id number]) as of 06/14/2019 10:26  Ref. Range 05/18/2017 12:44  FVC-Pre Latest Units: L 1.61  FVC-%Pred-Pre Latest Units: % 63     COMPARISON:  Chest CT 02/11/2018. -> July 2021   FINDINGS: Cardiovascular: Heart size is normal. There is no significant pericardial fluid, thickening or pericardial calcification. There is aortic atherosclerosis, as well as atherosclerosis of the great vessels of the mediastinum and the coronary arteries, including calcified  atherosclerotic plaque in the left main, left anterior descending, left circumflex and right coronary arteries. Calcifications of the mitral annulus.   Mediastinum/Nodes: No pathologically enlarged mediastinal or hilar lymph nodes. Please note that accurate exclusion of hilar adenopathy is limited on noncontrast CT scans. Esophagus is unremarkable in appearance. No axillary lymphadenopathy.   Lungs/Pleura: High-resolution images again demonstrate areas of septal thickening, subpleural reticulation, parenchymal banding, traction bronchiectasis, peripheral bronchiolectasis and mild honeycombing. These findings have a definitive craniocaudal gradient. No significant progression of disease compared to the prior study. Inspiratory and expiratory imaging is unremarkable. No acute consolidative airspace disease. No pleural effusions. No suspicious appearing pulmonary nodules or masses are noted. No acute consolidative airspace disease. No pleural effusions.   Upper Abdomen: Aortic atherosclerosis.   Musculoskeletal: There are no aggressive appearing lytic or blastic lesions noted in the visualized portions of the skeleton.   IMPRESSION: 1. The appearance of the lungs is compatible with interstitial lung disease, with a spectrum of findings considered diagnostic of usual interstitial pneumonia (UIP) per current ATS guidelines. No significant progression of disease compared to the prior examination. 2. Aortic atherosclerosis, in addition to left main and 3 vessel coronary artery disease. Assessment for potential risk factor modification, dietary therapy or pharmacologic therapy may be warranted, if clinically indicated.   Aortic Atherosclerosis (ICD10-I70.0).     Electronically Signed   By: Vinnie Langton M.D.   On: 07/29/2019 11:01 ROS - per HPI      OV 05/07/2020  Subjective:  Patient ID: [name], [name] , DOB: 06/21/1940 , age 45 y.o. , MRN: [id number] , ADDRESS: [id number] [place] [address] PCP Carol Ada, MD Patient Care Team: Carol Ada, MD as PCP - General (Family Medicine) Gavin Pound, MD as Consulting Physician (Rheumatology)  This Provider for this visit: Treatment Team:  Attending Provider: Brand Males, MD    05/07/2020 -   Chief Complaint  Patient presents with   Follow-up    Still having shortness of breath with activity.     Follow-up IPAF (UIP with autoimmune serology; June 2018 ANA 1:320, SSA > 8, dry eyes/mount + eSR 71)     - on supportive care due to goals of care (could not do PFT technically 2019) -   - last CT July 2021  Follow-up mild associated emphysema on CT scan   - on spiriva  ECho feb 2022 -  - gr1 ddx. Normal PA/RV and LV  HPI [name] 81 y.o. -returns for follow-up.  She says that compared to last visit she is somewhat  better.  She is seeing cardiology and has had echocardiogram in August 2021 and February 2022.  She has stable LV function and RV function.  No elevation pulmonary pressures.  She has grade 1 diastolic dysfunction.  She uses albuterol as needed and this helps her dyspnea.  She does not think rehabilitation will benefit her.  She tried Spiriva and did not help her.  She does not want to use it again.  She is okay with continued supportive care monitoring.  This time she said she is willing to give spirometry a try as part of her monitoring plan.  She is agreed to have high-resolution CT chest in 6 months.  Her ILD symptom score shows stability.  A walking desaturation test shows stability.    Results for EMERY, BINZ (MRN [id number]) as of 06/14/2019 10:26  Ref. Range 05/18/2017 12:44  FVC-Pre Latest Units: L 1.61  FVC-%Pred-Pre Latest Units: % 63     PFT  PFT Results Latest Ref Rng & Units 05/18/2017  FVC-Pre L 1.61  FVC-Predicted Pre % 63  Pre FEV1/FVC % % 93  FEV1-Pre L 1.51  FEV1-Predicted Pre % 76   OV 02/14/2021  Subjective:  Patient ID: [name],  [name] , DOB: 03/18/1940 , age 23 y.o. , MRN: [id number] , ADDRESS: [address] [place] [address] PCP Carol Ada, MD Patient Care Team: Carol Ada, MD as PCP - General (Family Medicine) Gavin Pound, MD as Consulting Physician (Rheumatology)  This Provider for this visit: Treatment Team:  Attending Provider: Brand Males, MD    02/14/2021 -   Chief Complaint  Patient presents with   Follow-up    PFT performed today.  Pt states she has been doing okay since last visit. States that she does become SOB with exertion.    Follow-up IPAF (UIP with autoimmune serology; June 2018 ANA 1:320, SSA > 8, dry eyes/mount + eSR 71)     - on supportive care due to goals of care (could not do PFT technically 2019) -   - last CT July Oct 2022 with slow pregrssion  Follow-up mild associated emphysema on CT scan   - on spiriva - not taking  ECho feb and sept 2022 -  - gr1 ddx. Normal PA/RV and LV  - similar sept 2022  HPI [name] 81 y.o. -returns for follow-up after nearly 1 year.  Actually 9 months.  She tells me she is stable.  She tells me her dyspnea is no change in 1 year.  She also tells me her dyspnea is no change in a few years.  However looking at the objective evaluation of her symptoms, her dyspnea is actually gotten worse.  The radiologist also thinks her ILD is worse compared to 1 year ago.  Most recent CT scan chest was in October 2022 and when compared to 2021 somewhat worse and compared to several years ago significantly worse.  She is not feeling this.  Her pulmonary function test is stable and so is a walking desaturation test.  We discussed the possibility of starting antifibrotic's based on the fact that there might be slight change in the CT scan and she has UIP.  The alternative is to weight and document progression of at least 5-10%.  We took a shared decision making to wait but both of [place] agreed that she needs close follow-up.  Okay    SYMPTOM SCALE  - ILD 06/14/2019  08/17/2019  05/07/2020  02/14/2021   O2 use ra ra  ra ra  Shortness of Breath 0 -> 5 scale with 5 being worst (score 6 If unable to do)     At rest 0 2 0 0  Simple tasks - showers, clothes change, eating, shaving _0 Household (dishes, doing bed, laundry) _1 Shopping _2 Walking level at own pace _3 Walking up Stairs _4 Total (30-36) Dyspnea Score _5 How bad is your cough? 2  1 0  How bad is your fatigue _6 How bad is nausea 00  0 0  How bad is vomiting?  0  0 0  How bad is diarrhea? 0  0 0  How bad is anxiety? 0 0 0 2  How bad is depression 0 1 0 3         Simple office walk 185 feet x  3 laps goal with forehead probe 08/18/2017    02/15/2018  06/14/2019  05/07/2020  02/14/2021   O2 used *room air Room air ra ra ra  Number laps completed _7 Comments about pace Normal pace Slow pace Slow pace slow Slow pace  Resting Pulse Ox/HR 100% and 67/min 100% and 73/min 100% and 63/mi 100% and 67/min 100% and 64  Final Pulse Ox/HR 100% and 91/min 100% and 96/min 92% and 84/mi 100% and 91/ 100% and 91  Desaturated </= 88% no No  no no no  Desaturated <= 3% points no no Yues, 8 ponts no no  Got Tachycardic >/= 90/min yes yes no yes yes  Symptoms at end of test _8   Miscellaneous comments x x   x      CT Chest data- HRCT - oct 2022  Narrative & Impression  CLINICAL DATA:  Interstitial lung disease, dyspnea on exertion, history of left breast cancer and radiation   EXAM: CT CHEST WITHOUT CONTRAST   TECHNIQUE: Multidetector CT imaging of the chest was performed following the standard protocol without intravenous contrast. High resolution imaging of the lungs, as well as inspiratory and expiratory imaging, was performed.   COMPARISON:  07/27/2019 02/11/2018, 02/15/2017, 07/22/2016   FINDINGS: Cardiovascular: Aortic atherosclerosis. Normal heart  size. Three-vessel coronary artery calcifications. No pericardial effusion.   Mediastinum/Nodes: No enlarged mediastinal, hilar, or axillary lymph nodes. Small hiatal hernia. Thyroid gland, trachea, and esophagus demonstrate no significant findings.   Lungs/Pleura: Redemonstrated moderate pulmonary fibrosis in a pattern with apical to basal gradient, featuring irregular peripheral interstitial opacity, septal thickening, traction bronchiectasis, subpleural bronchiolectasis, and areas of honeycombing at the lung bases. Fibrotic findings are slightly worsened compared to prior examination dated 07/27/2019 and clearly worsened over time on examinations dating back to 07/22/2016. No significant air trapping on expiratory phase imaging. No pleural effusion or pneumothorax.   Upper Abdomen: No acute abnormality.   Musculoskeletal: Calcified mass of the medial left breast, unchanged (series 2, image 89). No suspicious bone lesions identified.   IMPRESSION: 1. Redemonstrated moderate pulmonary fibrosis in a pattern with apical to basal gradient, featuring irregular peripheral interstitial opacity, septal thickening, traction bronchiectasis, subpleural bronchiolectasis, and areas of honeycombing at the lung bases. Fibrotic findings are slightly worsened compared to prior examination dated 07/27/2019 and clearly worsened over time on examinations dating back to 07/22/2016. Findings are consistent with UIP per consensus guidelines: Diagnosis of Idiopathic Pulmonary Fibrosis: An Official  ATS/ERS/JRS/ALAT Clinical Practice Guideline. [place], Iss 5, [phone], Sep 26 2016. 2. Coronary artery disease.   Aortic Atherosclerosis (ICD10-I70.0).     Electronically Signed   By: Delanna Ahmadi M.D.   On: 11/11/2020 09:12      No results found.    PFT  PFT Results Latest Ref Rng & Units 02/14/2021 05/18/2017  FVC-Pre L 1.84 1.61  FVC-Predicted Pre % 76 63  Pre  FEV1/FVC % % 84 93  FEV1-Pre L 1.54 1.51  FEV1-Predicted Pre % 82 76  DLCO uncorrected ml/min/mmHg 9.78 -  DLCO UNC% % 47 -  DLCO corrected ml/min/mmHg 9.78 -  DLCO COR %Predicted % 47 -  DLVA Predicted % 70 -       has a past medical history of Breast cancer ([place]), Diabetes mellitus without complication ([place]), Hypertension, Hypothyroidism, and Lupus ([place]).   reports that she has never smoked. She quit smokeless tobacco use about 6 years ago.  Her smokeless tobacco use included snuff.  Past Surgical History:  Procedure Laterality Date   BREAST LUMPECTOMY  2009    Allergies  Allergen Reactions   Ciprofloxacin Itching, Swelling and Other (See Comments)    Possibly causing tremors?   Crestor [Rosuvastatin] Other (See Comments)    Myalgia and back pain    Immunization History  Administered Date(s) Administered   Influenza, High Dose Seasonal PF 11/09/2016, 10/27/2018, 10/26/2020   Influenza,inj,Quad PF,6+ Mos 10/27/2015   PFIZER(Purple Top)SARS-COV-2 Vaccination 04/22/2019, 05/13/2019, 01/15/2020   Pneumococcal Conjugate-13 07/11/2013, 02/15/2018   Pneumococcal Polysaccharide-23 03/18/2020    Family History  Problem Relation Age of Onset   Cancer Father        ? type   Asthma Brother      Current Outpatient Medications:    albuterol (VENTOLIN HFA) 108 (90 Base) MCG/ACT inhaler, Inhale 2 puffs into the lungs every 4 (four) hours as needed for wheezing or shortness of breath., Disp: 18 g, Rfl: 3   capsaicin (ZOSTRIX) 0.025 % cream, Apply topically 3 (three) times daily as needed (for pain). , Disp: , Rfl:    ferrous sulfate 324 MG TBEC, Take 324 mg by mouth., Disp: , Rfl:    gabapentin (NEURONTIN) 300 MG capsule, Take 300 mg by mouth 2 (two) times daily., Disp: , Rfl:    glimepiride (AMARYL) 2 MG tablet, Take 2 mg by mouth daily., Disp: , Rfl:    hydroxychloroquine (PLAQUENIL) 200 MG tablet, Take 1 tablet (200 mg total) by mouth daily., Disp: 30 tablet, Rfl: 0    levothyroxine (SYNTHROID) 100 MCG tablet, Take 100 mcg by mouth daily., Disp: , Rfl:    omeprazole (PRILOSEC) 20 MG capsule, Take 20 mg by mouth daily., Disp: , Rfl:       Objective:   Vitals:   02/14/21 1349  BP: 126/70  Pulse: 64  Temp: 97.9 F (36.6 C)  TempSrc: Oral  SpO2: 100%  Weight: 152 lb 6.4 oz (69.1 kg)  Height: _0  (1.702 m)    Estimated body mass index is 23.87 kg/m as calculated from the following:   Height as of this encounter: _1  (1.702 m).   Weight as of this encounter: 152 lb 6.4 oz (69.1 kg).  _2 @  Filed Weights   02/14/21 1349  Weight: 152 lb 6.4 oz (69.1 kg)     Physical Exam    General: No distress. Looks well.  Neuro: Alert and Oriented x 3. GCS 15. Speech normal Psych: Pleasant  Resp:  Barrel Chest - no.  Wheeze - no, Crackles - mild base, No overt respiratory distress CVS: Normal heart sounds. Murmurs - no Ext: Stigmata of Connective Tissue Disease - no HEENT: Normal upper airway. PEERL +. No post nasal drip        Assessment:       ICD-10-CM   1. Dyspnea on exertion  R06.09 Pulmonary function test    2. ILD (interstitial lung disease) ([place])  J84.9 Pulmonary function test         Plan:     Patient Instructions     ICD-10-CM   1. Dyspnea on exertion  R06.09     2. ILD (interstitial lung disease) (HCC)  J84.9       Symptoms, walk test and PFT stable since last year but Ct san chest shows slight worsening  Plan  - continue observation approach but need close followup  - hold off antifibrotics for now - do spiro/dlco in 4-5 months   Follow-up -4-5 months and a 30-minute slot to interstitial lung disease clinic but after PFT  -Symptom questionnaire and simple walk test at follow-up    SIGNATURE    Dr. Brand Males, M.D., F.C.C.P,  Pulmonary and Critical Care Medicine Staff Physician, [place] Director - Interstitial Lung Disease  Program  [facility] [place], [place], [address]  Pager: [phone], If no answer or between  15:00h - 7:00h: call [phone] Telephone: [phone]  2:27 PM 02/14/2021

## 2021-02-14 NOTE — Patient Instructions (Addendum)
ICD-10-CM   1. Dyspnea on exertion  R06.09     2. ILD (interstitial lung disease) (HCC)  J84.9       Symptoms, walk test and PFT stable since last year but Ct san chest shows slight worsening  Plan  - continue observation approach but need close followup  - hold off antifibrotics for now - do spiro/dlco in 4-5 months   Follow-up -4-5 months and a 30-minute slot to interstitial lung disease clinic but after PFT  -Symptom questionnaire and simple walk test at follow-up

## 2021-02-14 NOTE — Progress Notes (Signed)
Spirometry and DLCO completed today  ?

## 2021-03-24 ENCOUNTER — Other Ambulatory Visit: Payer: Self-pay | Admitting: Family Medicine

## 2021-03-24 DIAGNOSIS — E2839 Other primary ovarian failure: Secondary | ICD-10-CM

## 2021-03-24 DIAGNOSIS — Z1231 Encounter for screening mammogram for malignant neoplasm of breast: Secondary | ICD-10-CM

## 2021-04-07 ENCOUNTER — Other Ambulatory Visit: Payer: Self-pay

## 2021-04-07 ENCOUNTER — Ambulatory Visit: Payer: Medicare Other

## 2021-04-07 DIAGNOSIS — I38 Endocarditis, valve unspecified: Secondary | ICD-10-CM

## 2021-04-07 DIAGNOSIS — R0609 Other forms of dyspnea: Secondary | ICD-10-CM

## 2021-04-17 ENCOUNTER — Encounter: Payer: Self-pay | Admitting: Cardiology

## 2021-04-17 ENCOUNTER — Other Ambulatory Visit: Payer: Self-pay

## 2021-04-17 ENCOUNTER — Ambulatory Visit: Payer: Medicare Other | Admitting: Cardiology

## 2021-04-17 VITALS — BP 138/72 | HR 90 | Temp 97.7°F | Resp 16 | Ht 67.0 in | Wt 153.2 lb

## 2021-04-17 DIAGNOSIS — M329 Systemic lupus erythematosus, unspecified: Secondary | ICD-10-CM

## 2021-04-17 DIAGNOSIS — I38 Endocarditis, valve unspecified: Secondary | ICD-10-CM

## 2021-04-17 DIAGNOSIS — R0609 Other forms of dyspnea: Secondary | ICD-10-CM

## 2021-04-17 NOTE — Progress Notes (Signed)
? ?Primary Physician/Referring:  Carol Ada, MD ? ?Patient ID: [name], female    DOB: [birth year], 81 y.o.   MRN: [id number] ? ?Chief Complaint  ?Patient presents with  ? [place] endocarditis  ? Shortness of Breath  ? Follow-up  ? ?HPI:   ? ?[name]  is a 81 y.o. 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  She was treated with Xarelto for repeated of 6 months. ? ?Reviewed last office visit due to near syncopal spells, I discontinued lisinopril HCT due to low blood pressure, this is 22-monthoffice visit.  She has not had any further episodes of near syncope, presently doing well except for mild chronic dyspnea.  She has not had any recent hospitalization. ? ?Past Medical History:  ?Diagnosis Date  ? Breast cancer ([place]   ? Diabetes mellitus without complication ([place]   ? Hypertension   ? Hypothyroidism   ? [place]upus ([place]   ? ?Past Surgical History:  ?Procedure Laterality Date  ? BREAST LUMPECTOMY  2009  ? ?Family History  ?Problem Relation Age of Onset  ? Cancer Father   ?     ? type  ? Asthma Brother   ?  ?Social History  ? ?Tobacco Use  ? Smoking status: Never  ? Smokeless tobacco: Former  ?  Types: Snuff  ?  Quit date: 02/03/2015  ?Substance Use Topics  ? Alcohol use: No  ? ?Marital Status: Single  ?ROS  ?Review of Systems  ?Cardiovascular:  Positive for dyspnea on exertion (stable). Negative for chest pain and leg swelling.  ?Respiratory:  Positive for wheezing.   ?Musculoskeletal:  Positive for arthritis and back pain.  ?Gastrointestinal:  Negative for melena.  ?Objective  ?Blood  pressure 138/72, pulse 90, temperature 97.7 ?F (36.5 ?C), temperature source Temporal, resp. rate 16, height 5' 7"  (1.702 m), weight 153 lb 3.2 oz (69.5 kg), SpO2 98 %.  ? ?  04/17/2021  ?  2:23 PM 04/17/2021  ?  2:20 PM 02/14/2021  ?  1:49 PM  ?Vitals with BMI  ?Height  5' 7"  5' 7"   ?Weight  153 lbs 3 oz 152 lbs 6 oz  ?BMI  23.99 23.86  ?Systolic [id number] ?Diastolic 72 84 70  ?Pulse 90 79 64  ?  ? Physical Exam ?Neck:  ?   Vascular: No carotid bruit or JVD.  ?Cardiovascular:  ?   Rate and Rhythm: Normal rate and regular rhythm.  ?   Pulses: Intact distal pulses.  ?   Heart sounds: Normal heart sounds. No murmur heard. ?  No gallop.  ?Pulmonary:  ?   Effort: Pulmonary effort is normal.  ?   Breath sounds: Rales (coarse bilateral basal leathery crackles) present.  ?Abdominal:  ?   General: Bowel sounds are normal.  ?   Palpations: Abdomen is soft.  ?Musculoskeletal:     ?   General: No swelling.  ?   Cervical back: Neck supple.  ?Skin: ?   Capillary Refill: Capillary refill takes less than 2 seconds.  ?Neurological:  ?   Mental Status: [name], place, and time.  ?Psychiatric:     ?  Mood and Affect: Mood normal.  ? ?Laboratory examination:  ? ?No results for input(s): NA, K, CL, CO2, GLUCOSE, BUN, CREATININE, CALCIUM, GFRNONAA, GFRAA in the last 8760 hours. ?CrCl cannot be calculated (Patient's most recent lab result is older than the maximum 21 days allowed.).  ? ?  Latest Ref Rng & Units 02/20/2019  ?  2:48 AM 02/18/2019  ?  2:33 AM 02/17/2019  ?  9:17 PM  ?CMP  ?Glucose 70 - 99 mg/dL 121   50   63    ?BUN 8 - 23 mg/dL 13   13   13     ?Creatinine 0.44 - 1.00 mg/dL 1.13   1.23   1.34    ?Sodium 135 - 145 mmol/L 131   126   121    ?Potassium 3.5 - 5.1 mmol/L 4.5   4.4   3.6    ?Chloride 98 - 111 mmol/L 97   94   88    ?CO2 22 - 32 mmol/L 24   22   23     ?Calcium 8.9 - 10.3 mg/dL 9.4   9.2   9.1    ?Total Protein 6.5 - 8.1 g/dL 7.8   8.0     ?Total Bilirubin 0.3 - 1.2 mg/dL 0.5   0.3     ?Alkaline  Phos 38 - 126 U/L 50   57     ?AST 15 - 41 U/L 23   27     ?ALT 0 - 44 U/L 18   19     ? ? ?  Latest Ref Rng & Units 02/20/2019  ?  2:48 AM 02/18/2019  ?  2:33 AM 02/17/2019  ?  9:17 PM  ?CBC  ?WBC 4.0 - 10.5 K/uL 4.8   5.7   5.7    ?Hemoglobin 12.0 - 15.0 g/dL 11.3   12.2   12.7    ?Hematocrit 36.0 - 46.0 % 33.3   36.8   37.5    ?Platelets 150 - 400 K/uL 212   225   238    ? ?TSH ?No results for input(s): TSH in the last 8760 hours. ? ?External labs: ? ?Labs 06/25/2020: ? ?A1c 7.5%. ? ?Hb 11.3/HCT 33.7, platelets 234.  Normal indicis. ? ?Serum glucose 122, BUN 13, creatinine 1.20, EGFR 46 mL, potassium 4.3, CMP otherwise normal. ? ?Total cholesterol 104, triglycerides 113, HDL 37, LDL 67.  Non-HDL cholesterol 113. ? ?Medications and allergies  ? ?Allergies  ?Allergen Reactions  ? Ciprofloxacin Itching, Swelling and Other (See Comments)  ?  Possibly causing tremors?  ? Crestor [Rosuvastatin] Other (See Comments)  ?  Myalgia and back pain  ?  ? ?Current Outpatient Medications:  ?  albuterol (VENTOLIN HFA) 108 (90 Base) MCG/ACT inhaler, Inhale 2 puffs into the lungs every 4 (four) hours as needed for wheezing or shortness of breath., Disp: 18 g, Rfl: 3 ?  capsaicin (ZOSTRIX) 0.025 % cream, Apply topically 3 (three) times daily as needed (for pain). , Disp: , Rfl:  ?  ferrous sulfate 324 MG TBEC, Take 324 mg by mouth., Disp: , Rfl:  ?  gabapentin (NEURONTIN) 300 MG capsule, Take 300 mg by mouth 2 (two) times daily., Disp: , Rfl:  ?  glimepiride (AMARYL) 2 MG tablet, Take 2 mg by mouth daily., Disp: , Rfl:  ?  hydroxychloroquine (PLAQUENIL) 200 MG tablet, Take 1 tablet (200 mg total) by mouth daily., Disp: 30 tablet, Rfl: 0 ?  levothyroxine (SYNTHROID) 100 MCG tablet, Take  100 mcg by mouth daily., Disp: , Rfl:  ?  omeprazole (PRILOSEC) 20 MG capsule, Take 20 mg by mouth daily., Disp: , Rfl:   ?Radiology:  ? ?CT head without contrast and CT cervical spine without contrast 02/18/2019: ?1. No acute intracranial pathology. ?2.  No acute/traumatic cervical spine pathology. Multilevel ?degenerative changes most prominent at C4-C5. ? ?HRCT chest 07/29/2019: ?1. The appearance of the lungs is compatible with interstitial lung disease, with a spectrum of findings considered diagnostic of usual interstitial pneumonia (UIP) per current ATS guidelines. No ?significant progression of disease compared to the prior  examination. ?2. Aortic atherosclerosis, in addition to left main and 3 vessel coronary artery disease. Assessment for potential risk factor modification, dietary therapy or pharmacologic therapy may be warranted, if clinically indicated. ?3. Aortic Atherosclerosis (ICD10-I70.0). ? ?Cardiac Studies:  ? ?Lexiscan myoview stress test 07/03/2016: ?1. The resting electrocardiogram demonstrated normal sinus rhythm, normal resting conduction, PVC and normal rest repolarization. Stress EKG is non-diagnostic for ischemia as it a pharmacologic stress using Lexiscan. Stress symptoms included dyspnea. ?2. Myocardial perfusion imaging is normal. Overall left ventricular systolic function was normal without regional wall motion abnormalities. The left ventricular ejection fraction was 75%. ?  ?Carotid artery duplex  02/18/2019: ?Right Carotid: Velocities in the right ICA are consistent with a 1-39% stenosis. ?Left Carotid: Velocities in the left ICA are consistent with a 1-39% stenosis. ?Vertebrals:  Bilateral vertebral arteries demonstrate antegrade flow. ?Subclavians: Normal flow hemodynamics were seen in bilateral subclavian arteries. ? ?PCV ECHOCARDIOGRAM COMPLETE 04/07/2021 ? ?Narrative ?Echocardiogram 04/07/2021: ?Normal LV systolic function with EF 57%. Left ventricle cavity is normal in size. Moderate concentric hypertrophy of the left ventricle. Normal global wall motion. Doppler evidence of grade I (impaired) diastolic dysfunction, normal LAP. Calculated EF 57%. ?Left atrial cavity is mildly dilated. ?Mild calcification of the mitral valve  annulus. Mild mitral valve leaflet thickening with mild calcification especially of the anterior MV leaflet. Trace mitral regurgitation. No evidence of mitral valve stenosis. ?Structurally normal tricuspid valve

## 2021-04-21 ENCOUNTER — Telehealth: Payer: Self-pay | Admitting: Hematology and Oncology

## 2021-04-21 NOTE — Telephone Encounter (Signed)
Scheduled appt per 3/24 referral. Pt is aware of appt date and time. Pt is aware to arrive 15 mins prior to appt time and to bring and updated insurance card. Pt is aware of appt location.   ?

## 2021-04-23 ENCOUNTER — Other Ambulatory Visit: Payer: Self-pay | Admitting: Family Medicine

## 2021-04-24 ENCOUNTER — Other Ambulatory Visit: Payer: Self-pay | Admitting: Family Medicine

## 2021-04-24 DIAGNOSIS — G4489 Other headache syndrome: Secondary | ICD-10-CM

## 2021-05-07 ENCOUNTER — Other Ambulatory Visit: Payer: Self-pay

## 2021-05-07 ENCOUNTER — Inpatient Hospital Stay: Payer: Medicare Other | Attending: Hematology and Oncology | Admitting: Hematology and Oncology

## 2021-05-07 ENCOUNTER — Inpatient Hospital Stay: Payer: Medicare Other

## 2021-05-07 ENCOUNTER — Encounter: Payer: Self-pay | Admitting: Hematology and Oncology

## 2021-05-07 VITALS — BP 184/84 | HR 72 | Temp 97.3°F | Resp 18 | Ht 67.0 in | Wt 152.7 lb

## 2021-05-07 DIAGNOSIS — E119 Type 2 diabetes mellitus without complications: Secondary | ICD-10-CM | POA: Insufficient documentation

## 2021-05-07 DIAGNOSIS — M329 Systemic lupus erythematosus, unspecified: Secondary | ICD-10-CM | POA: Insufficient documentation

## 2021-05-07 DIAGNOSIS — Z853 Personal history of malignant neoplasm of breast: Secondary | ICD-10-CM | POA: Diagnosis not present

## 2021-05-07 DIAGNOSIS — Z803 Family history of malignant neoplasm of breast: Secondary | ICD-10-CM | POA: Insufficient documentation

## 2021-05-07 DIAGNOSIS — Z8042 Family history of malignant neoplasm of prostate: Secondary | ICD-10-CM | POA: Diagnosis not present

## 2021-05-07 DIAGNOSIS — R779 Abnormality of plasma protein, unspecified: Secondary | ICD-10-CM

## 2021-05-07 DIAGNOSIS — Z79899 Other long term (current) drug therapy: Secondary | ICD-10-CM | POA: Diagnosis not present

## 2021-05-07 DIAGNOSIS — E039 Hypothyroidism, unspecified: Secondary | ICD-10-CM | POA: Insufficient documentation

## 2021-05-07 DIAGNOSIS — R7982 Elevated C-reactive protein (CRP): Secondary | ICD-10-CM | POA: Insufficient documentation

## 2021-05-07 DIAGNOSIS — Z7984 Long term (current) use of oral hypoglycemic drugs: Secondary | ICD-10-CM | POA: Diagnosis not present

## 2021-05-07 DIAGNOSIS — Z923 Personal history of irradiation: Secondary | ICD-10-CM | POA: Insufficient documentation

## 2021-05-07 DIAGNOSIS — I10 Essential (primary) hypertension: Secondary | ICD-10-CM | POA: Diagnosis not present

## 2021-05-07 LAB — CBC WITH DIFFERENTIAL (CANCER CENTER ONLY)
Abs Immature Granulocytes: 0.01 10*3/uL (ref 0.00–0.07)
Basophils Absolute: 0 10*3/uL (ref 0.0–0.1)
Basophils Relative: 0 %
Eosinophils Absolute: 0.1 10*3/uL (ref 0.0–0.5)
Eosinophils Relative: 2 %
HCT: 36.4 % (ref 36.0–46.0)
Hemoglobin: 11.7 g/dL — ABNORMAL LOW (ref 12.0–15.0)
Immature Granulocytes: 0 %
Lymphocytes Relative: 38 %
Lymphs Abs: 2 10*3/uL (ref 0.7–4.0)
MCH: 29.8 pg (ref 26.0–34.0)
MCHC: 32.1 g/dL (ref 30.0–36.0)
MCV: 92.9 fL (ref 80.0–100.0)
Monocytes Absolute: 0.3 10*3/uL (ref 0.1–1.0)
Monocytes Relative: 7 %
Neutro Abs: 2.8 10*3/uL (ref 1.7–7.7)
Neutrophils Relative %: 53 %
Platelet Count: 213 10*3/uL (ref 150–400)
RBC: 3.92 MIL/uL (ref 3.87–5.11)
RDW: 13.6 % (ref 11.5–15.5)
WBC Count: 5.2 10*3/uL (ref 4.0–10.5)
nRBC: 0 % (ref 0.0–0.2)

## 2021-05-07 LAB — CMP (CANCER CENTER ONLY)
ALT: 17 U/L (ref 0–44)
AST: 23 U/L (ref 15–41)
Albumin: 4.1 g/dL (ref 3.5–5.0)
Alkaline Phosphatase: 63 U/L (ref 38–126)
Anion gap: 7 (ref 5–15)
BUN: 9 mg/dL (ref 8–23)
CO2: 28 mmol/L (ref 22–32)
Calcium: 9.4 mg/dL (ref 8.9–10.3)
Chloride: 103 mmol/L (ref 98–111)
Creatinine: 1.02 mg/dL — ABNORMAL HIGH (ref 0.44–1.00)
GFR, Estimated: 56 mL/min — ABNORMAL LOW (ref >60)
Glucose, Bld: 115 mg/dL — ABNORMAL HIGH (ref 70–99)
Potassium: 3.6 mmol/L (ref 3.5–5.1)
Sodium: 138 mmol/L (ref 135–145)
Total Bilirubin: 0.3 mg/dL (ref 0.3–1.2)
Total Protein: 9.1 g/dL — ABNORMAL HIGH (ref 6.5–8.1)

## 2021-05-07 LAB — LACTATE DEHYDROGENASE: LDH: 198 U/L — ABNORMAL HIGH (ref 98–192)

## 2021-05-07 NOTE — Progress Notes (Signed)
?[facility] ?Telephone:([phone]   [phone] ? ?INITIAL CONSULT NOTE ? ?Patient Care Team: ?Carol Ada, MD as PCP - General (Family Medicine) ?Gavin Pound, MD as Consulting Physician (Rheumatology) ? ?Hematological/Oncological History ?# Elevated Serum Protein ?04/16/2021: noted to have elevated serum protein SPEP ordered showed Total Globulin of 5.2 (nml 2.2-3.9). No monoclonal protein noted.  ?05/07/2021: establish care with Dr. Lorenso Courier  ? ?#Breast Cancer ?2009: s/p resection and adjuvant radiation therapy.  ? ?CHIEF COMPLAINTS/PURPOSE OF CONSULTATION:  ?"Elevated Serum Protein " ? ?HISTORY OF PRESENTING ILLNESS:  ?[name] 81 y.o. female with medical history significant for DM type II, HTN, hypothyroidism, and lupus who presents for evaluation of elevated serum protein.  ? ?On review of the previous records [name] was found to have elevated total globulin of 5.2 on 04/16/2021.  An SPEP showed that there was no evidence of monoclonal protein.  Due to concern for the elevated serum protein the patient was referred to hematology. ? ?On exam today [name] notes that "all I know is my doctor suggested I come see you today".  She notes that she was told she had a blood abnormality with increased protein.  She notes that she has a diagnosis of lupus which was diagnosed in 2011.  Is currently well controlled on Plaquenil therapy.  She notes that she sees her rheumatologist every 6 months.  She does have shortness of breath on occasion but notes that she has been "lazy ever since I retired in 2012."  She has that she does have chronic back pain and her chiropractor says her spine is "twisted".  She notes she has not seen any changes in her urine such as foaming or change in the color. ? ?On further discussion she notes that she is a never smoker never drinker.  Her family history is remarkable for type 2 diabetes in her mother and prostate cancer.  Her daughter had a history  of breast cancer.  The patient also endorses having had breast cancer herself treated in 2009 with surgery and subsequent radiation.  She notes that she is not having any issues with fevers, chills, sweats, nausea, vomiting or diarrhea.  A full 10 point ROS is listed below. ? ?MEDICAL HISTORY:  ?Past Medical History:  ?Diagnosis Date  ? Breast cancer ([place])   ? Diabetes mellitus without complication ([place])   ? Hypertension   ? Hypothyroidism   ? Lupus ([place])   ? ? ?SURGICAL HISTORY: ?Past Surgical History:  ?Procedure Laterality Date  ? BREAST LUMPECTOMY  2009  ? ? ?SOCIAL HISTORY: ?Social History  ? ?Socioeconomic History  ? Marital status: Single  ?  Spouse name: Not on file  ? Number of children: 2  ? Years of education: Not on file  ? Highest education level: Not on file  ?Occupational History  ? Not on file  ?Tobacco Use  ? Smoking status: Never  ? Smokeless tobacco: Former  ?  Types: Snuff  ?  Quit date: 02/03/2015  ?Vaping Use  ? Vaping Use: Never used  ?Substance and Sexual Activity  ? Alcohol use: No  ? Drug use: No  ? Sexual activity: Not on file  ?Other Topics Concern  ? Not on file  ?Social History Narrative  ? Not on file  ? ?Social Determinants of Health  ? ?Financial Resource Strain: Not on file  ?Food Insecurity: Not on file  ?Transportation Needs: Not on file  ?Physical Activity: Not on file  ?Stress: Not  on file  ?Social Connections: Not on file  ?Intimate Partner Violence: Not on file  ? ? ?FAMILY HISTORY: ?Family History  ?Problem Relation Age of Onset  ? Cancer Father   ?     ? type  ? Asthma Brother   ? ? ?ALLERGIES:  is allergic to ciprofloxacin and crestor [rosuvastatin]. ? ?MEDICATIONS:  ?Current Outpatient Medications  ?Medication Sig Dispense Refill  ? albuterol (VENTOLIN HFA) 108 (90 Base) MCG/ACT inhaler Inhale 2 puffs into the lungs every 4 (four) hours as needed for wheezing or shortness of breath. 18 g 3  ? capsaicin (ZOSTRIX) 0.025 % cream Apply topically 3 (three) times daily as needed  (for pain).     ? ferrous sulfate 324 MG TBEC Take 324 mg by mouth.    ? gabapentin (NEURONTIN) 300 MG capsule Take 300 mg by mouth 2 (two) times daily.    ? glimepiride (AMARYL) 2 MG tablet Take 2 mg by mouth daily.    ? hydroxychloroquine (PLAQUENIL) 200 MG tablet Take 1 tablet (200 mg total) by mouth daily. 30 tablet 0  ? levothyroxine (SYNTHROID) 100 MCG tablet Take 100 mcg by mouth daily.    ? losartan-hydrochlorothiazide (HYZAAR) 50-12.5 MG tablet Take 1 tablet by mouth daily.    ? omeprazole (PRILOSEC) 20 MG capsule Take 20 mg by mouth daily.    ? ?No current facility-administered medications for this visit.  ? ? ?REVIEW OF SYSTEMS:   ?Constitutional: ( - ) fevers, ( - )  chills , ( - ) night sweats ?Eyes: ( - ) blurriness of vision, ( - ) double vision, ( - ) watery eyes ?Ears, nose, mouth, throat, and face: ( - ) mucositis, ( - ) sore throat ?Respiratory: ( - ) cough, ( - ) dyspnea, ( - ) wheezes ?Cardiovascular: ( - ) palpitation, ( - ) chest discomfort, ( - ) lower extremity swelling ?Gastrointestinal:  ( - ) nausea, ( - ) heartburn, ( - ) change in bowel habits ?Skin: ( - ) abnormal skin rashes ?Lymphatics: ( - ) new lymphadenopathy, ( - ) easy bruising ?Neurological: ( - ) numbness, ( - ) tingling, ( - ) new weaknesses ?Behavioral/Psych: ( - ) mood change, ( - ) new changes  ?All other systems were reviewed with the patient and are negative. ? ?PHYSICAL EXAMINATION: ? ?Vitals:  ? 05/07/21 1259  ?BP: (!) 184/84  ?Pulse: 72  ?Resp: 18  ?Temp: (!) 97.3 ?F (36.3 ?C)  ?SpO2: 97%  ? ?Filed Weights  ? 05/07/21 1259  ?Weight: 152 lb 11.2 oz (69.3 kg)  ? ? ?GENERAL: well appearing elderly African-American female in NAD  ?SKIN: skin color, texture, turgor are normal, no rashes or significant lesions ?EYES: conjunctiva are pink and non-injected, sclera clear ?LUNGS: clear to auscultation and percussion with normal breathing effort ?HEART: regular rate & rhythm and no murmurs and no lower extremity  edema ?Musculoskeletal: no cyanosis of digits and no clubbing  ?PSYCH: alert & oriented x 3, fluent speech ?NEURO: no focal motor/sensory deficits ? ?LABORATORY DATA:  ?I have reviewed the data as listed ? ?  Latest Ref Rng & Units 02/20/2019  ?  2:48 AM 02/18/2019  ?  2:33 AM 02/17/2019  ?  9:17 PM  ?CBC  ?WBC 4.0 - 10.5 K/uL 4.8   5.7   5.7    ?Hemoglobin 12.0 - 15.0 g/dL 11.3   12.2   12.7    ?Hematocrit 36.0 - 46.0 % 33.3   36.8  37.5    ?Platelets 150 - 400 K/uL 212   225   238    ? ? ? ?  Latest Ref Rng & Units 02/20/2019  ?  2:48 AM 02/18/2019  ?  2:33 AM 02/17/2019  ?  9:17 PM  ?CMP  ?Glucose 70 - 99 mg/dL 121   50   63    ?BUN 8 - 23 mg/dL 13   13   13     ?Creatinine 0.44 - 1.00 mg/dL 1.13   1.23   1.34    ?Sodium 135 - 145 mmol/L 131   126   121    ?Potassium 3.5 - 5.1 mmol/L 4.5   4.4   3.6    ?Chloride 98 - 111 mmol/L 97   94   88    ?CO2 22 - 32 mmol/L 24   22   23     ?Calcium 8.9 - 10.3 mg/dL 9.4   9.2   9.1    ?Total Protein 6.5 - 8.1 g/dL 7.8   8.0     ?Total Bilirubin 0.3 - 1.2 mg/dL 0.5   0.3     ?Alkaline Phos 38 - 126 U/L 50   57     ?AST 15 - 41 U/L 23   27     ?ALT 0 - 44 U/L 18   19     ? ? ? ?ASSESSMENT & PLAN ?Deatra James 81 y.o. female with medical history significant for DM type II, HTN, hypothyroidism, and lupus who presents for evaluation of elevated serum protein.  ? ?After review of the labs, review of the records, and discussion with the patient the patients findings are most consistent with elevated total globulin in the setting of autoimmune disease.  Typically inflammatory running conditions result in higher levels of serum protein.  At this time there is no evidence of a monoclonal protein, however we will conduct a full work-up to ensure that there is no evidence of a monoclonal gammopathy. ? ?Monoclonal Gammopathies are a group of medical conditions defined by the presence of a monoclonal protein (an M protein) in the blood or urine. Monoclonal gammopathies include monoclonal  gammopathy of unknown significance (MGUS), Monoclonal gammopathies of renal or neurological significance,  smoldering multiple myeloma (SMM), multiple myeloma (MM), AL amyloidosis, and Waldenstrom macroglobulinemia. The goal

## 2021-05-08 LAB — BETA 2 MICROGLOBULIN, SERUM: Beta-2 Microglobulin: 2.4 mg/L (ref 0.6–2.4)

## 2021-05-08 LAB — KAPPA/LAMBDA LIGHT CHAINS
Kappa free light chain: 116.6 mg/L — ABNORMAL HIGH (ref 3.3–19.4)
Kappa, lambda light chain ratio: 1.64 (ref 0.26–1.65)
Lambda free light chains: 71.2 mg/L — ABNORMAL HIGH (ref 5.7–26.3)

## 2021-05-09 LAB — MULTIPLE MYELOMA PANEL, SERUM
Albumin SerPl Elph-Mcnc: 3.8 g/dL (ref 2.9–4.4)
Albumin/Glob SerPl: 0.8 (ref 0.7–1.7)
Alpha 1: 0.3 g/dL (ref 0.0–0.4)
Alpha2 Glob SerPl Elph-Mcnc: 0.7 g/dL (ref 0.4–1.0)
B-Globulin SerPl Elph-Mcnc: 1.2 g/dL (ref 0.7–1.3)
Gamma Glob SerPl Elph-Mcnc: 2.8 g/dL — ABNORMAL HIGH (ref 0.4–1.8)
Globulin, Total: 5 g/dL — ABNORMAL HIGH (ref 2.2–3.9)
IgA: 600 mg/dL — ABNORMAL HIGH (ref 64–422)
IgG (Immunoglobin G), Serum: 2674 mg/dL — ABNORMAL HIGH (ref 586–1602)
IgM (Immunoglobulin M), Srm: 30 mg/dL (ref 26–217)
Total Protein ELP: 8.8 g/dL — ABNORMAL HIGH (ref 6.0–8.5)

## 2021-05-12 ENCOUNTER — Telehealth: Payer: Self-pay | Admitting: *Deleted

## 2021-05-12 NOTE — Telephone Encounter (Signed)
-----   Message from Orson Slick, MD sent at 05/12/2021  9:19 AM EDT ----- ?Please let [name] know that her work did not show any evidence of abnormal protein in her blood. She does have increased protein, but fortunately there were no 'red flags' for abnormal protein. There is no need for routine f/u in our clinic.  ? ?----- Message ----- ?From: Interface, Lab In [place] ?Sent: 05/07/2021   2:13 PM EDT ?To: Orson Slick, MD ? ? ?

## 2021-05-12 NOTE — Telephone Encounter (Signed)
TCT patient's daughter, Justice Britain as pt phone was not current. ?Spoke with [name] and advised that her labs did not reveal any "red Flag" type proteins. She had an elevated protein level but it was not the concerning type.  Advised that she still needs to turn in her 24 hour urine but otherwise, she does not need to follow up in this clinic. ?[name] voiced understanding. ?

## 2021-05-15 ENCOUNTER — Other Ambulatory Visit: Payer: Medicare Other

## 2021-05-15 ENCOUNTER — Ambulatory Visit
Admission: RE | Admit: 2021-05-15 | Discharge: 2021-05-15 | Disposition: A | Discharge status: Home / Self Care | Payer: Medicare Other | Source: Ambulatory Visit | Attending: Family Medicine | Admitting: Family Medicine

## 2021-05-15 DIAGNOSIS — G4489 Other headache syndrome: Secondary | ICD-10-CM

## 2021-05-21 ENCOUNTER — Other Ambulatory Visit (HOSPITAL_COMMUNITY): Payer: Self-pay | Admitting: Interventional Radiology

## 2021-05-21 DIAGNOSIS — I671 Cerebral aneurysm, nonruptured: Secondary | ICD-10-CM

## 2021-05-26 ENCOUNTER — Other Ambulatory Visit: Payer: Self-pay

## 2021-05-28 ENCOUNTER — Other Ambulatory Visit: Payer: Self-pay | Admitting: Radiology

## 2021-05-29 ENCOUNTER — Other Ambulatory Visit: Payer: Self-pay

## 2021-05-29 ENCOUNTER — Other Ambulatory Visit (HOSPITAL_COMMUNITY): Payer: Self-pay | Admitting: Interventional Radiology

## 2021-05-29 ENCOUNTER — Ambulatory Visit (HOSPITAL_COMMUNITY)
Admission: RE | Admit: 2021-05-29 | Discharge: 2021-05-29 | Disposition: A | Discharge status: Home / Self Care | Payer: Medicare Other | Source: Ambulatory Visit | Attending: Interventional Radiology | Admitting: Interventional Radiology

## 2021-05-29 DIAGNOSIS — M329 Systemic lupus erythematosus, unspecified: Secondary | ICD-10-CM | POA: Diagnosis not present

## 2021-05-29 DIAGNOSIS — E039 Hypothyroidism, unspecified: Secondary | ICD-10-CM | POA: Diagnosis not present

## 2021-05-29 DIAGNOSIS — E119 Type 2 diabetes mellitus without complications: Secondary | ICD-10-CM | POA: Insufficient documentation

## 2021-05-29 DIAGNOSIS — I10 Essential (primary) hypertension: Secondary | ICD-10-CM | POA: Insufficient documentation

## 2021-05-29 DIAGNOSIS — I671 Cerebral aneurysm, nonruptured: Secondary | ICD-10-CM | POA: Insufficient documentation

## 2021-05-29 DIAGNOSIS — G4489 Other headache syndrome: Secondary | ICD-10-CM | POA: Insufficient documentation

## 2021-05-29 DIAGNOSIS — Z87891 Personal history of nicotine dependence: Secondary | ICD-10-CM | POA: Diagnosis not present

## 2021-05-29 DIAGNOSIS — Z7984 Long term (current) use of oral hypoglycemic drugs: Secondary | ICD-10-CM | POA: Insufficient documentation

## 2021-05-29 HISTORY — PX: IR 3D INDEPENDENT WKST: IMG2385

## 2021-05-29 HISTORY — PX: IR ANGIO VERTEBRAL SEL VERTEBRAL BILAT MOD SED: IMG5369

## 2021-05-29 HISTORY — PX: IR ANGIO INTRA EXTRACRAN SEL COM CAROTID INNOMINATE BILAT MOD SED: IMG5360

## 2021-05-29 LAB — CBC
HCT: 37.9 % (ref 36.0–46.0)
Hemoglobin: 12.3 g/dL (ref 12.0–15.0)
MCH: 30.2 pg (ref 26.0–34.0)
MCHC: 32.5 g/dL (ref 30.0–36.0)
MCV: 93.1 fL (ref 80.0–100.0)
Platelets: 233 10*3/uL (ref 150–400)
RBC: 4.07 MIL/uL (ref 3.87–5.11)
RDW: 13.6 % (ref 11.5–15.5)
WBC: 5.5 10*3/uL (ref 4.0–10.5)
nRBC: 0 % (ref 0.0–0.2)

## 2021-05-29 LAB — BASIC METABOLIC PANEL
Anion gap: 8 (ref 5–15)
BUN: 12 mg/dL (ref 8–23)
CO2: 26 mmol/L (ref 22–32)
Calcium: 9.5 mg/dL (ref 8.9–10.3)
Chloride: 101 mmol/L (ref 98–111)
Creatinine, Ser: 1.1 mg/dL — ABNORMAL HIGH (ref 0.44–1.00)
GFR, Estimated: 51 mL/min — ABNORMAL LOW (ref >60)
Glucose, Bld: 123 mg/dL — ABNORMAL HIGH (ref 70–99)
Potassium: 3.8 mmol/L (ref 3.5–5.1)
Sodium: 135 mmol/L (ref 135–145)

## 2021-05-29 LAB — GLUCOSE, CAPILLARY
Glucose-Capillary: 105 mg/dL — ABNORMAL HIGH (ref 70–99)
Glucose-Capillary: 102 mg/dL — ABNORMAL HIGH (ref 70–99)

## 2021-05-29 LAB — PROTIME-INR
INR: 1 (ref 0.8–1.2)
Prothrombin Time: 12.9 seconds (ref 11.4–15.2)

## 2021-05-29 MED ORDER — HEPARIN SODIUM (PORCINE) 1000 UNIT/ML IJ SOLN
INTRAMUSCULAR | Status: AC
  Filled 2021-05-29: qty 1

## 2021-05-29 MED ORDER — IOHEXOL 300 MG/ML  SOLN: 100.0000 mL | Freq: Once | INTRAMUSCULAR | Status: DC | PRN

## 2021-05-29 MED ORDER — LIDOCAINE HCL 1 % IJ SOLN
INTRAMUSCULAR | Status: AC
  Filled 2021-05-29: qty 20

## 2021-05-29 MED ORDER — HEPARIN SODIUM (PORCINE) 1000 UNIT/ML IJ SOLN
INTRAMUSCULAR | Status: AC | PRN
  Administered 2021-05-29: 1000 [IU] via INTRAVENOUS

## 2021-05-29 MED ORDER — SODIUM CHLORIDE 0.9 % IV SOLN: Freq: Once | INTRAVENOUS | Status: AC

## 2021-05-29 MED ORDER — SODIUM CHLORIDE 0.9 % IV SOLN: INTRAVENOUS | Status: AC

## 2021-05-29 MED ORDER — FENTANYL CITRATE (PF) 100 MCG/2ML IJ SOLN
INTRAMUSCULAR | Status: AC
  Filled 2021-05-29: qty 2

## 2021-05-29 MED ORDER — MIDAZOLAM HCL 2 MG/2ML IJ SOLN
INTRAMUSCULAR | Status: AC
  Filled 2021-05-29: qty 2

## 2021-05-29 MED ORDER — HYDRALAZINE HCL 20 MG/ML IJ SOLN
INTRAMUSCULAR | Status: AC
  Filled 2021-05-29: qty 1

## 2021-05-29 MED ORDER — LIDOCAINE HCL (PF) 1 % IJ SOLN
INTRAMUSCULAR | Status: AC | PRN
  Administered 2021-05-29: 10 mL

## 2021-05-29 MED ORDER — FENTANYL CITRATE (PF) 100 MCG/2ML IJ SOLN
INTRAMUSCULAR | Status: AC | PRN
  Administered 2021-05-29: 25 ug via INTRAVENOUS

## 2021-05-29 MED ORDER — HYDRALAZINE HCL 20 MG/ML IJ SOLN
INTRAMUSCULAR | Status: AC | PRN
  Administered 2021-05-29 (×2): 5 mg via INTRAVENOUS

## 2021-05-29 MED ORDER — MIDAZOLAM HCL 2 MG/2ML IJ SOLN
INTRAMUSCULAR | Status: AC | PRN
  Administered 2021-05-29: 1 mg via INTRAVENOUS

## 2021-05-29 NOTE — Sedation Documentation (Signed)
Patient transported to short stay. Right groin site intact. No drainage noted from dressing. Soft to palpation, no hematoma noted. +2 palpable distal pulse intact.  ?

## 2021-05-29 NOTE — Sedation Documentation (Signed)
5 Fr Exoseal failed to deploy ?

## 2021-05-29 NOTE — Procedures (Signed)
INR. ?Four-vessel cerebral arteriogram. ?Right CFA approach. ?Findings. ?Less than 2 mm infundibulum arising at the origin of the left posterior communicating artery. ?Mild atherosclerotic disease of the proximal left internal carotid artery. ? ?Arlean Hopping MD ? ?

## 2021-05-29 NOTE — H&P (Signed)
? ?Chief Complaint: ?Patient was seen in consultation today for  cerebral arteriogram ? ?Referring Physician(s): ?Dr. Carol Ada ? ?Supervising Physician: Luanne Bras ? ?Patient Status: [facility] - Out-pt ? ?History of Present Illness: ?[name] is a 81 y.o. female who has been having recent headaches, primarily left sided.  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 ?She has been referred to Dr. Estanislado Pandy for formal cerebral angiogram. ?PMHx, meds, labs, imaging, allergies reviewed. ?Feels well, no recent fevers, chills, illness. ?States left sided headaches usually very brief and not associated with any neurologic symptoms such as visual changes, slurred speech, or weakness. ?Has been NPO today as directed. ? ? ? ?Past Medical History:  ?Diagnosis Date  ? Breast cancer ([place])   ? Diabetes mellitus without complication ([place])   ? Hypertension   ? Hypothyroidism   ? Lupus ([place])   ? ? ?Past Surgical History:  ?Procedure Laterality Date  ? BREAST LUMPECTOMY  2009  ? ? ?Allergies: ?Ciprofloxacin and Crestor [rosuvastatin] ? ?Medications: ?Prior to Admission medications   ?Medication Sig Start Date End Date Taking? Authorizing Provider  ?albuterol (VENTOLIN HFA) 108 (90 Base) MCG/ACT inhaler Inhale 2 puffs into the lungs every 4 (four) hours as needed for wheezing or shortness of breath. 04/15/20  Yes Adrian Prows, MD  ?diclofenac Sodium (VOLTAREN) 1 % GEL Apply 1 application. topically 4 (four) times daily as needed (pain).   Yes [provider]  ?ferrous sulfate 325 (65 FE) MG tablet Take 325 mg by mouth daily.   Yes [provider]  ?gabapentin (NEURONTIN) 300 MG capsule Take 300 mg by mouth every 8 (eight) hours.   Yes [provider]  ?glimepiride (AMARYL) 1 MG tablet Take 1 mg by mouth daily with breakfast.   Yes [provider]  ?hydroxychloroquine (PLAQUENIL) 200 MG tablet Take 1  tablet (200 mg total) by mouth daily. 01/06/15  Yes Kelvin Cellar, MD  ?ibuprofen (ADVIL) 200 MG tablet Take 200 mg by mouth every 6 (six) hours as needed for moderate pain or headache.   Yes [provider]  ?levothyroxine (SYNTHROID) 100 MCG tablet Take 100 mcg by mouth daily. 02/05/21  Yes [provider]  ?naproxen sodium (ALEVE) 220 MG tablet Take 220 mg by mouth daily as needed (pain).   Yes [provider]  ?omeprazole (PRILOSEC) 20 MG capsule Take 20 mg by mouth daily before breakfast.   Yes [provider]  ?fluticasone (FLONASE) 50 MCG/ACT nasal spray Place 1 spray into both nostrils daily as needed for allergies or rhinitis.    [provider]  ?lisinopril (ZESTRIL) 10 MG tablet Take 10 mg by mouth daily.    [provider]  ?OVER THE COUNTER MEDICATION Take 1 tablet by mouth daily as needed (allergies). Flonase headache & allergy relief    [provider]  ?Tiotropium Bromide Monohydrate (SPIRIVA RESPIMAT) 1.25 MCG/ACT AERS Inhale 2 puffs into the lungs daily. ?Patient not taking: Reported on 09/15/2019 08/17/19 11/29/19  Brand Males, MD  ?  ? ?Family History  ?Problem Relation Age of Onset  ? Cancer Father   ?     ? type  ? Asthma Brother   ? ? ?Social History  ? ?Socioeconomic History  ? Marital status: Single  ?  Spouse name: Not on file  ? Number of children: 2  ? Years of education: Not on file  ? Highest education  level: Not on file  ?Occupational History  ? Not on file  ?Tobacco Use  ? Smoking status: Never  ? Smokeless tobacco: Former  ?  Types: Snuff  ?  Quit date: 02/03/2015  ?Vaping Use  ? Vaping Use: Never used  ?Substance and Sexual Activity  ? Alcohol use: No  ? Drug use: No  ? Sexual activity: Not on file  ?Other Topics Concern  ? Not on file  ?Social History Narrative  ? Not on file  ? ?Social Determinants of Health  ? ?Financial Resource Strain: Not on file  ?Food Insecurity: Not on file  ?Transportation Needs: Not on file   ?Physical Activity: Not on file  ?Stress: Not on file  ?Social Connections: Not on file  ? ? ?Review of Systems: A 12 point ROS discussed and pertinent positives are indicated in the HPI above.  All other systems are negative. ? ?Review of Systems ? ?Vital Signs: ?BP (!) 186/81   Pulse (!) 55   Temp 98.3 ?F (36.8 ?C) (Oral)   Resp 18   Ht '5\' 7"'$  (1.702 m)   Wt 68.9 kg   SpO2 98%   BMI 23.81 kg/m?  ? ?Physical Exam ?HENT:  ?   Mouth/Throat:  ?   Mouth: Mucous membranes are moist.  ?   Pharynx: Oropharynx is clear.  ?Cardiovascular:  ?   Rate and Rhythm: Normal rate and regular rhythm.  ?   Pulses: Normal pulses.  ?   Heart sounds: Normal heart sounds.  ?Pulmonary:  ?   Effort: Pulmonary effort is normal. No respiratory distress.  ?   Breath sounds: Normal breath sounds.  ?Skin: ?   General: Skin is warm and dry.  ?Neurological:  ?   General: No focal deficit present.  ?   Mental Status: She is alert and oriented to person, place, and time.  ?   Cranial Nerves: No cranial nerve deficit.  ?Psychiatric:     ?   Mood and Affect: Mood normal.     ?   Thought Content: Thought content normal.     ?   Judgment: Judgment normal.  ? ? ? ? ?Imaging: ?MR ANGIO HEAD WO CONTRAST ? ?Result Date: 05/15/2021 ?CLINICAL DATA:  Other headache syndrome EXAM: MRA HEAD WITHOUT CONTRAST TECHNIQUE: Angiographic images of the Circle of Willis were acquired using MRA technique without intravenous contrast. COMPARISON:  No pertinent prior exam. FINDINGS: POSTERIOR CIRCULATION: --Vertebral arteries: Normal --Inferior cerebellar arteries: Normal. --Basilar artery: Normal. --Superior cerebellar arteries: Normal. --Posterior cerebral arteries: Normal. ANTERIOR CIRCULATION: --Intracranial internal carotid arteries: There is a laterally projecting 2 mm outpouching arising from the clinoid segment of the left ICA. Otherwise normal. --Anterior cerebral arteries (ACA): Normal. Hypoplastic right A1 segment, normal variant. --Middle cerebral  arteries (MCA): Normal. ANATOMIC VARIANTS: None IMPRESSION: 1. No intracranial arterial occlusion or high-grade stenosis. 2. The 2 mm outpouching projecting laterally from the clinoid segment of the left ICA may indicate a small aneurysm or an area of atherosclerotic irregularity. Electronically Signed   By: Ulyses Jarred M.D.   On: 05/15/2021 21:00   ? ?Labs: ? ?CBC: ?Recent Labs  ?  05/07/21 ?1345  ?WBC 5.2  ?HGB 11.7*  ?HCT 36.4  ?PLT 213  ? ? ?COAGS: ?No results for input(s): INR, APTT in the last 8760 hours. ? ?BMP: ?Recent Labs  ?  05/07/21 ?1345  ?NA 138  ?K 3.6  ?CL 103  ?CO2 28  ?GLUCOSE 115*  ?BUN 9  ?CALCIUM 9.4  ?  CREATININE 1.02*  ?GFRNONAA 56*  ? ? ?LIVER FUNCTION TESTS: ?Recent Labs  ?  05/07/21 ?1345  ?BILITOT 0.3  ?AST 23  ?ALT 17  ?ALKPHOS 63  ?PROT 9.1*  ?ALBUMIN 4.1  ? ? ?TUMOR MARKERS: ?No results for input(s): AFPTM, CEA, CA199, CHROMGRNA in the last 8760 hours. ? 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 ?Risks and benefits of cerebral angiogram were discussed with the patient including, but not limited to bleeding, infection, vascular injury or contrast induced renal failure. ? ?This interventional procedure involves the use of X-rays and because of the nature of the planned procedure, it is possible that we will have prolonged use of X-ray fluoroscopy. ? ?Potential radiation risks to you include (but are not limited to) the following: ?- A slightly elevated risk for cancer  several years later in life. This risk is typically less ?than 0.5% percent. This risk is low in comparison to the normal incidence of human ?cancer, which is 33% for women and 50% for men according to the American Cancer ?Society. ?- Radiation induced injury can include skin redness, resembling a rash, tissue breakdown / ulcers and hair loss (which can be temporary or permanent).  ? ?The likelihood of either of these occurring depends  on the difficulty of the procedure and whether you are sensitive to radiation due to previous procedures, disease, or genetic conditions.  ? ?IF your procedure requires a prolonged use of radiation, you will be not

## 2021-05-30 ENCOUNTER — Ambulatory Visit: Payer: Self-pay

## 2021-05-30 NOTE — Telephone Encounter (Signed)
?  Chief Complaint: headache ?Symptoms: headache 10/10 on L side of forehead down face and into neck  ?Frequency: 1 hr suddenly ?Pertinent Negatives: Patient denies vision changes ?Disposition: '[x]'$ ED /'[]'$ Urgent Care (no appt availability in office) / '[]'$ Appointment(In office/virtual)/ '[]'$  [place] [facility]/ '[]'$ Home Care/ '[]'$ Refused Recommended Disposition /'[]'$  Mobile Bus/ '[]'$  Follow-up with PCP ?Additional Notes: pt's daughter states pt had testing to check for aneursym yesterday and didn't give f/up instructions on pain but pt concerned about headache and wanted to see if she needed to go back to ED. I advised daughter to take pt to ED for evaluation.  ? ? ?Reason for Disposition ? [1] SEVERE headache AND [2] sudden-onset (i.e., reaching maximum intensity within seconds to 1 hour) ? ?Answer Assessment - Initial Assessment Questions ?1. LOCATION: "Where does it hurt?"  ?    L side forehead down to face  ?2. ONSET: "When did the headache start?" (Minutes, hours or days)  ?    Started to get really bad around 5pm  ?3. PATTERN: "Does the pain come and go, or has it been constant since it started?" ?    Constant  ?4. SEVERITY: "How bad is the pain?" and "What does it keep you from doing?"  (e.g., Scale 1-10; mild, moderate, or severe) ?  - MILD (1-3): doesn't interfere with normal activities  ?  - MODERATE (4-7): interferes with normal activities or awakens from sleep  ?  - SEVERE (8-10): excruciating pain, unable to do any normal activities    ?    10 ?8. HEAD INJURY: "Has there been any recent injury to the head?"  ?    Had testing for brain aneurysm  ?9. OTHER SYMPTOMS: "Do you have any other symptoms?" (fever, stiff neck, eye pain, sore throat, cold symptoms) ?    No ? ?Protocols used: Headache-A-AH ? ?

## 2021-06-03 ENCOUNTER — Ambulatory Visit (INDEPENDENT_AMBULATORY_CARE_PROVIDER_SITE_OTHER): Payer: Medicare Other | Admitting: Internal Medicine

## 2021-06-03 VITALS — BP 124/78 | HR 66 | Temp 98.0°F | Ht 66.75 in | Wt 153.6 lb

## 2021-06-03 DIAGNOSIS — R768 Other specified abnormal immunological findings in serum: Secondary | ICD-10-CM | POA: Diagnosis not present

## 2021-06-03 DIAGNOSIS — J849 Interstitial pulmonary disease, unspecified: Secondary | ICD-10-CM

## 2021-06-03 DIAGNOSIS — R0609 Other forms of dyspnea: Secondary | ICD-10-CM | POA: Diagnosis not present

## 2021-06-03 DIAGNOSIS — J84112 Idiopathic pulmonary fibrosis: Secondary | ICD-10-CM | POA: Diagnosis not present

## 2021-06-03 LAB — PULMONARY FUNCTION TEST
DL/VA % pred: 76 %
DL/VA: 3.05 ml/min/mmHg/L
DLCO cor % pred: 48 %
DLCO cor: 10.11 ml/min/mmHg
DLCO unc % pred: 47 %
DLCO unc: 9.75 ml/min/mmHg
FEF 25-75 Pre: 1.64 L/sec
FEF2575-%Pred-Pre: 106 %
FEV1-%Pred-Pre: 84 %
FEV1-Pre: 1.56 L
FEV1FVC-%Pred-Pre: 108 %
FEV6-%Pred-Pre: 83 %
FEV6-Pre: 1.9 L
FEV6FVC-%Pred-Pre: 104 %
FVC-%Pred-Pre: 80 %
FVC-Pre: 1.9 L
Pre FEV1/FVC ratio: 82 %
Pre FEV6/FVC Ratio: 100 %

## 2021-06-03 NOTE — Progress Notes (Signed)
? ? ? ?PCP Kathryn Mccreedy, MD ? ?HPI ? ?IOV 07/16/2016 ? ?Chief Complaint  ?Patient presents with  ? Shortness of Breath  ?  consult for sob referring doctor is Dr. Einar [name] patient states that she is very sob and using the inhaler helps her very much   ? ? ?According to the referral chart from Dr. Einar [name], he'll originally saw her on 06/26/2016 for shortness of breath at follow-up. Patient reported to him that she's had insidious onset of shortness of breath for several years with progression in the last several months associated with bilateral chest tightness. Apparently patient was given a diagnosis of asthma and bronchodilator use does help her. Per the patient history spirometry testing outside was abnormal. Dyspnea is significant enough patient not able to do her household work. There is associated fatigue. A trial of diuresis by the primary care physician did help the shortness of breath. Patient's symptoms are associated with lisinopril intake for hypertension.  exhaled nitric oxide test today in the office is 19 ppb and normal.  IgE. ''Walking desaturation test on 07/16/2016 185 feet x 3 laps on room air forehead probe:  did NOT desaturate. Rest pulse ox was 100%, final pulse ox was 100%. HR response 75/min at rest to 85/min at peak exertion. 2-D echocardiogram 02/21/2016 as reviewed from the outside chart shows mild concentric left ventricular remodeling with ejection fraction 79. Normal right ventricle size and contractility. No evidence of diastolic dysfunction. No pericardial effusion ? ? ? ?In talking to the patient and her daughter [name] the above information was confirmed. In addition she is on Advair discus for the last few months because of the above problems. It is helping her. She takes it as needed which ends up being every other day once. Today the exhaled nitric oxide was normal 16 ppb which is essentially on Advair. She is also on lisinopril for the last few years. She does have associated  mild cough. Evaluation by cardiology did show crackles in the concern is he has interstitial lung disease therefore she is referred here. Associated with concern of interstitial lung disease is a history of lupus that has been present for many years. When she relocated to [facility] few years ago with a primary care physician has been following this. Patient is on plaquenil but not steroids The family is unsure about SLE blood work ? ?Results for Kathryn, PROVENCAL (MRN [id number]) as of 07/16/2016 10:45 ? Ref. Range 01/05/2015 09:31 01/12/2015 16:07 01/12/2015 19:45 01/12/2015 20:00 01/12/2015 20:02  ?Hemoglobin Latest Ref Range: 12.0 - 15.0 g/dL 10.4 (L) 11.9 (L)     ? ? ? ?OV 07/30/2016 ? ?Chief Complaint  ?Patient presents with  ? Follow-up  ?  Pt was here for a PFT but was unable to complete it. Pt continues to be SOB with some improvement, occ. dry cough.  ? ?Here to follow-up for pulmonary test results for interstitial lung disease workup. In terms of test results her ESR is high ANA is positive and her Sjogren's antibodies positive but lupus antibodies and rheumatoid factor antibodies are negative. Her CT scan does show interstitial lung disease at the base. She could not complete pulmonary function test due to inability to follow direction but I suspect the disease is mild because of walking desaturation test last visit was mild. Her daughter and she appeared to understand these test results. She reports dyspnea and cough. She is on ACE inhibitor. ? ? ? ?OV 02/02/2017 ? ?Chief Complaint  ?Patient presents with  ?  Follow-up  ?  Pt states she  has been doing good since last visit. Denies any complaints of cough, SOB, or CP.  ? 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 ?She presents for follow-up.  In the interim she has no complaints.  She only has mild dyspnea on exertion.  Walking desaturation test on 185 feet x3 laps on room air.  Resting pulse ox 100%.  Final  pulse ox 97%.  Resting heart rate was 70/min.  Final heart rate was 87/min.  She did see a rheumatologist Dr. Gavin [name] in practice last month.  I reviewed the note.  It appears that they are not finding any other systemic evidence of autoimmune disease.  Therefore she is still maintained on Plaquenil.  According to the notes patient is also Smith and RNP antibody positive.  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  Most recent CT scan of the chest in summer 2018 as a possible/probable UIP. ? ?Results for Kathryn, [name] (MRN [id number]) as of 07/30/2016 10:00 ? Ref. Range 07/16/2016 12:09 07/16/2016 12:18  ?Nitric Oxide Unknown  16  ?Sed Rate Latest Ref Range: 0 - 30 mm/hr 77 (H)   ?Anit Nuclear Antibody(ANA) Latest Ref Range: NEGATIVE  POS (A)   ?ANA Pattern 1 Unknown NUCLEOLAR (A)   ?ANA Titer 1 Latest Units: titer 1:320 (H)   ?Cyclic Citrullin Peptide Ab Latest Units: Units <16   ?ds DNA Ab Latest Units: IU/mL 1   ?RA Latex Turbid. Latest Ref Range: <14 IU/mL <14   ?SSA (Ro) (ENA) Antibody, IgG Latest Ref Range: <1.0 NEG AI  >8.0 POS (H)   ?SSB (La) (ENA) Antibody, IgG Latest Ref Range: <1.0 NEG AI  <1.0 NEG   ?Scleroderma (Scl-70) (ENA) Antibody, IgG Latest Ref Range: <1.0 NEG AI  <1.0 NEG   ? ? ?IMPRESSION: CT CHEST HIGH RESOLUTION ?1. 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 Differential includes ?fibrotic nonspecific interstitial pneumonia (NSIP). Follow-up ?high-resolution chest CT in 12 months would be useful to assess ?temporal pattern stability, as clinically warranted. ?2. Solitary 3 mm solid right upper lobe pulmonary nodule. No ?follow-up needed if patient is low-risk. Non-contrast chest CT can ?be considered in 12 months if patient is high-risk. This ?recommendation follows the consensus statement: Guidelines for ?Management of Incidental  Pulmonary Nodules Detected on CT Images: ?From the Fleischner Society 2017; Radiology 2017; 284:228-243. ?3. Left main and 3 vessel coronary atherosclerosis. ?4. Small hiatal hernia. ?5. Diffuse hepatic steatosis. ?  ?Aortic Atherosclerosis (ICD10-I70.0) and Emphysema (ICD10-J43.9). ?  ?  ?Electronically Signed ?  By: [name] M.D. ?  On: 07/22/2016 14:17 ? ?OV 05/18/2017 ? ?Chief Complaint  ?Patient presents with  ? Follow-up  ?  PFT attempted today but pt was unable to complete as per Ria Comment pt did not want to do the PFT.  HRCT done 1/21.  Pt states she has been doing well. Breathing is about the same as last visit. Denies any complaints or concerns.  ? ? 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 ?[name] returns for follow-up.  In the interim overall she is stable.  She is an extremely poor historian.  After asking her multiple times in different ways she believes earlier this year she had a cardiac stress test for a coronary artery calcification by Dr. Einar [name] and this was normal.  She is completely unaware of the fact that she has interstitial lung disease.  She believes she has asthma but when I questioned her and directed her that I have explained to her that she has pulmonary fibrosis she then admitted to the in fact she understands that she has pulmonary fibrosis.  Overall she is stable she has mild dyspnea on exertion without any cough.  She thinks she had lupus diagnosed by a rheumatologist in [facility] but when I told her that there is no systemic evidence of autoimmune disease when she saw [facility] rheumatology she seems surprised that she even made that visit.  There are no new issues. ? ? ?Walking desaturation test on 05/18/2017 185 feet x 3 laps on ROOM AIR:  did not have symptioms or  desaturate. Rest pulse ox was 100%, final pulse ox was 95%. HR response 69/min at rest to 90/min at peak exertion. Patient Ladean Steinmeyer  Did not Desaturate < 88% .  Deatra James yes did  Desaturated </= 3% points. Deatra James yes did get tachyardic ? ? ?IMPRESSION: ?Lungs/Pleura: No pneumothorax. No pleural effusion. Posterior right ?upper lobe 3 mm solid pulmonary nod

## 2021-06-03 NOTE — Progress Notes (Signed)
Spirometry and Dlco done today. 

## 2021-06-03 NOTE — Patient Instructions (Addendum)
ICD-10-CM   ?1. ILD (interstitial lung disease) ([place])  J84.9   ?  ?2. ANA positive  R76.8   ?  ? ? ? ?Symptoms PFT stable since last year though Ct [facility] chest shows slight worsening ? ?Plan (shared decision making) ? - continue observation approach but need close followup ? - hold off antifibrotics for now ?- do spiro/dlco in 6 months ?- sign release to get records from Dr Dossie Der your current rheumatologist ? - if no specific autoimmune diseaes then I have to change classificatio of your ILD from IPAF -> IPF ? ? ?Follow-up ?-60month and a 30-minute slot to interstitial lung disease clinic but after PFT ? -Symptom questionnaire and simple walk test at follow-up ?

## 2021-06-26 ENCOUNTER — Ambulatory Visit
Admission: EM | Admit: 2021-06-26 | Discharge: 2021-06-26 | Disposition: A | Discharge status: Home / Self Care | Payer: Medicare Other | Attending: Family Medicine | Admitting: Family Medicine

## 2021-06-26 ENCOUNTER — Encounter: Payer: Self-pay | Admitting: Emergency Medicine

## 2021-06-26 ENCOUNTER — Ambulatory Visit (INDEPENDENT_AMBULATORY_CARE_PROVIDER_SITE_OTHER): Payer: Medicare Other

## 2021-06-26 DIAGNOSIS — R051 Acute cough: Secondary | ICD-10-CM

## 2021-06-26 DIAGNOSIS — J849 Interstitial pulmonary disease, unspecified: Secondary | ICD-10-CM

## 2021-06-26 DIAGNOSIS — J9801 Acute bronchospasm: Secondary | ICD-10-CM

## 2021-06-26 MED ORDER — PREDNISONE 20 MG PO TABS: 40.0000 mg | ORAL_TABLET | Freq: Every day | ORAL | 0 refills | Status: AC

## 2021-06-26 MED ORDER — ALBUTEROL SULFATE HFA 108 (90 BASE) MCG/ACT IN AERS: 2.0000 | INHALATION_SPRAY | RESPIRATORY_TRACT | 0 refills | Status: DC | PRN

## 2021-06-26 NOTE — ED Triage Notes (Signed)
Pt is present today for a cough that started one week ago

## 2021-06-26 NOTE — ED Provider Notes (Signed)
[facility]    CSN: [id number] Arrival date & time: 06/26/21  1147      History   Chief Complaint Chief Complaint  Patient presents with   Cough    HPI [name] is a 81 y.o. female.    Cough Here for congestion and cough.  About 10 days ago she started having upper respiratory symptoms with nasal congestion and cough.  Then the cough is worsened.  She has felt like she needed her inhaler more often than usual.  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  She has been out of her albuterol inhaler however  Fever at any point  Past medical history also includes hypertension, hypothyroidism and diabetes.  She states her sugars have been good lately  Past Medical History:  Diagnosis Date   Breast cancer ([place])    Diabetes mellitus without complication ([place])    Hypertension    Hypothyroidism    Lupus ([place])     Patient Active Problem List   Diagnosis Date Noted   Acute cystitis 02/20/2019   Near syncope 02/18/2019   Hyponatremia 02/18/2019   ILD (interstitial lung disease) ([place]) 07/30/2016   Dyspnea 07/16/2016   Bibasilar crackles 07/16/2016   History of systemic lupus erythematosus (SLE) ([place]) 07/16/2016   Cough 07/16/2016   Encounter for monitoring ACE-inhibitor therapy 07/16/2016   History of asthma 07/16/2016   Fever, unspecified    Hypothyroidism 01/03/2015   HTN (hypertension) 01/03/2015   Drug-induced hypersensitivity reaction 01/03/2015   Lupus ([place]) 01/03/2015   Pyrexia 01/02/2015    Past Surgical History:  Procedure Laterality Date   BREAST LUMPECTOMY  2009   IR 3D INDEPENDENT WKST  05/29/2021   IR ANGIO INTRA EXTRACRAN SEL COM CAROTID INNOMINATE BILAT MOD SED  05/29/2021   IR ANGIO VERTEBRAL SEL VERTEBRAL BILAT MOD SED  05/29/2021    OB History   No obstetric history on file.      Home Medications    Prior to Admission medications   Medication Sig Start Date End Date Taking? Authorizing Provider   predniSONE (DELTASONE) 20 MG tablet Take 2 tablets (40 mg total) by mouth daily with breakfast for 3 days. 06/26/21 06/29/21 Yes Arron Tetrault, Gwenlyn Perking, MD  albuterol (VENTOLIN HFA) 108 (90 Base) MCG/ACT inhaler Inhale 2 puffs into the lungs every 4 (four) hours as needed for wheezing or shortness of breath. 06/26/21   Barrett Henle, MD  diclofenac Sodium (VOLTAREN) 1 % GEL Apply 1 application. topically 4 (four) times daily as needed (pain).    [provider]  ferrous sulfate 325 (65 FE) MG tablet Take 325 mg by mouth daily.    [provider]  fluticasone (FLONASE) 50 MCG/ACT nasal spray Place 1 spray into both nostrils daily as needed for allergies or rhinitis.    [provider]  gabapentin (NEURONTIN) 300 MG capsule Take 300 mg by mouth every 8 (eight) hours.    [provider]  glimepiride (AMARYL) 1 MG tablet Take 1 mg by mouth daily with breakfast.    [provider]  hydroxychloroquine (PLAQUENIL) 200 MG tablet Take 1 tablet (200 mg total) by mouth daily. 01/06/15   Kelvin Cellar, MD  ibuprofen (ADVIL) 200 MG tablet Take 200 mg by mouth every 6 (six) hours as needed for moderate pain or headache.    [provider]  levothyroxine (SYNTHROID) 100 MCG tablet Take 100 mcg by mouth daily. 02/05/21   [provider]  lisinopril (ZESTRIL) 10 MG tablet Take 10 mg by mouth daily.    [provider]  naproxen sodium (ALEVE) 220 MG tablet Take 220 mg by mouth daily as needed (pain).    [provider]  omeprazole (PRILOSEC) 20 MG capsule Take 20 mg by mouth daily before breakfast.    [provider]  OVER THE COUNTER MEDICATION Take 1 tablet by mouth daily as needed (allergies). Flonase headache & allergy relief    [provider]  Tiotropium Bromide Monohydrate (SPIRIVA RESPIMAT) 1.25 MCG/ACT AERS Inhale 2 puffs into the lungs daily. Patient not taking: Reported on 09/15/2019 08/17/19 11/29/19  Brand Males, MD    Family History Family History  Problem Relation Age of Onset   Cancer Father        ? type   Asthma Brother     Social History Social History   Tobacco Use   Smoking status: Never   Smokeless tobacco: Former    Types: Snuff    Quit date: 02/03/2015  Vaping Use   Vaping Use: Never used  Substance Use Topics   Alcohol use: No   Drug use: No     Allergies   Ciprofloxacin and Crestor [rosuvastatin]   Review of Systems Review of Systems  Respiratory:  Positive for cough.     Physical Exam Triage Vital Signs ED Triage Vitals  Enc Vitals Group     BP 06/26/21 1249 (!) 178/81     Pulse Rate 06/26/21 1249 82     Resp 06/26/21 1249 18     Temp 06/26/21 1249 98.1 F (36.7 C)     Temp src --      SpO2 06/26/21 1249 95 %     Weight --      Height --      Head Circumference --      Peak Flow --      Pain Score 06/26/21 1248 0     Pain Loc --      Pain Edu? --      Excl. in [place]? --    No data found.  Updated Vital Signs BP (!) 178/81   Pulse 82   Temp 98.1 F (36.7 C)   Resp 18   SpO2 95%   Visual Acuity Right Eye Distance:   Left Eye Distance:   Bilateral Distance:    Right Eye Near:   Left Eye Near:    Bilateral Near:     Physical Exam Vitals reviewed.  Constitutional:      General: She is not in acute distress.    Appearance: She is not toxic-appearing.  HENT:     Right Ear: Tympanic membrane and ear canal normal.     Left Ear: Tympanic membrane and ear canal normal.     Nose: Nose normal.     Mouth/Throat:     Mouth: Mucous membranes are moist.     Pharynx: No oropharyngeal exudate or posterior oropharyngeal erythema.     Comments: edentulous Eyes:     Extraocular Movements: Extraocular movements intact.     Conjunctiva/sclera: Conjunctivae normal.     Pupils: Pupils are equal, round, and reactive to light.  Cardiovascular:     Rate and Rhythm: Normal rate and regular rhythm.     Heart sounds: No murmur heard. Pulmonary:      Effort: No respiratory distress.     Breath sounds: No stridor. No wheezing, rhonchi or rales.     Comments: Expiratory breath sounds  are diminished Musculoskeletal:     Cervical back: Neck supple.  Lymphadenopathy:     Cervical: No cervical adenopathy.  Skin:    Capillary Refill: Capillary refill takes less than 2 seconds.     Coloration: Skin is not jaundiced or pale.  Neurological:     General: No focal deficit present.     Mental Status: She is alert and oriented to person, place, and time.  Psychiatric:        Behavior: Behavior normal.     UC Treatments / Results  Labs (all labs ordered are listed, but only abnormal results are displayed) Labs Reviewed - No data to display  EKG   Radiology DG Chest 2 View  Result Date: 06/26/2021 CLINICAL DATA:  Cough EXAM: CHEST - 2 VIEW COMPARISON:  11/15/2020 FINDINGS: Cardiac size is within normal limits. There are no signs of alveolar pulmonary edema. There is peribronchial thickening. Increased interstitial markings are seen in the both lower lung fields with no significant interval change. There are coarse calcifications in the left breast. IMPRESSION: Peribronchial thickening suggests bronchitis. There is no focal pulmonary consolidation. Increased interstitial markings in both lower lung fields suggest scarring from interstitial lung disease. Electronically Signed   By: Elmer Picker M.D.   On: 06/26/2021 13:22    Procedures Procedures (including critical care time)  Medications Ordered in UC Medications - No data to display  Initial Impression / Assessment and Plan / UC Course  I have reviewed the triage vital signs and the nursing notes.  Pertinent labs & imaging results that were available during my care of the patient were reviewed by me and considered in my medical decision making (see chart for details).     X-ray shows evidence of bronchitis but no infiltrate.  I will treat for possible bronchospasm and  exacerbation with 3 days of prednisone. Final Clinical Impressions(s) / UC Diagnoses   Final diagnoses:  Acute cough  Bronchospasm     Discharge Instructions      Your chest x-ray did not show any pneumonia  Albuterol inhaler--do 2 puffs every 4 hours as needed for shortness of breath or wheezing  Prednisone 20 mg--2 tablets daily for 3 days     ED Prescriptions     Medication Sig Dispense Auth. Provider   albuterol (VENTOLIN HFA) 108 (90 Base) MCG/ACT inhaler Inhale 2 puffs into the lungs every 4 (four) hours as needed for wheezing or shortness of breath. 18 g Barrett Henle, MD   predniSONE (DELTASONE) 20 MG tablet Take 2 tablets (40 mg total) by mouth daily with breakfast for 3 days. 6 tablet Windy Carina Gwenlyn Perking, MD      PDMP not reviewed this encounter.   Barrett Henle, MD 06/26/21 [phone]

## 2021-06-26 NOTE — Discharge Instructions (Addendum)
Your chest x-ray did not show any pneumonia  Albuterol inhaler--do 2 puffs every 4 hours as needed for shortness of breath or wheezing  Prednisone 20 mg--2 tablets daily for 3 days

## 2021-06-30 ENCOUNTER — Ambulatory Visit
Admission: EM | Admit: 2021-06-30 | Discharge: 2021-06-30 | Disposition: A | Discharge status: Home / Self Care | Payer: Medicare Other | Attending: Internal Medicine | Admitting: Internal Medicine

## 2021-06-30 DIAGNOSIS — R058 Other specified cough: Secondary | ICD-10-CM

## 2021-06-30 MED ORDER — BENZONATATE 200 MG PO CAPS: 200.0000 mg | ORAL_CAPSULE | Freq: Three times a day (TID) | ORAL | 0 refills | Status: DC | PRN

## 2021-06-30 NOTE — ED Triage Notes (Signed)
Pt presents with productive cough X 2 weeks that has been unrelieved with prescribed medication; pt states the coughing is causing chest, rib & back pain.

## 2021-06-30 NOTE — ED Provider Notes (Signed)
[facility]    CSN: [id number] Arrival date & time: 06/30/21  1013      History   Chief Complaint Chief Complaint  Patient presents with   Cough    HPI [name] is a 81 y.o. female comes to the urgent care with a 2-week history of cough productive of clear sputum.  Patient's symptoms started insidiously and has been persistent.  She denies any fever or chills.  No chest tightness or wheezing.  She complains of chest and back pain associated with coughing.  No nausea or vomiting.  No fever or chills.  No sick contacts.  She denies any sore throat or runny nose.  Patient is fully vaccinated against COVID-19 virus.  No weight loss or night sweats.  No dizziness, near syncope or syncopal episodes.  Patient was seen in the urgent care about 4 days ago and had normal chest x-ray.  She was prescribed a short course of steroids which she is completed.  She continues to cough and the cough is disrupting her sleep pattern. HPI  Past Medical History:  Diagnosis Date   Breast cancer ([place])    Diabetes mellitus without complication ([place])    Hypertension    Hypothyroidism    Lupus ([place])     Patient Active Problem List   Diagnosis Date Noted   Acute cystitis 02/20/2019   Near syncope 02/18/2019   Hyponatremia 02/18/2019   ILD (interstitial lung disease) ([place]) 07/30/2016   Dyspnea 07/16/2016   Bibasilar crackles 07/16/2016   History of systemic lupus erythematosus (SLE) ([place]) 07/16/2016   Cough 07/16/2016   Encounter for monitoring ACE-inhibitor therapy 07/16/2016   History of asthma 07/16/2016   Fever, unspecified    Hypothyroidism 01/03/2015   HTN (hypertension) 01/03/2015   Drug-induced hypersensitivity reaction 01/03/2015   Lupus ([place]) 01/03/2015   Pyrexia 01/02/2015    Past Surgical History:  Procedure Laterality Date   BREAST LUMPECTOMY  2009   IR 3D INDEPENDENT WKST  05/29/2021   IR ANGIO INTRA EXTRACRAN SEL COM CAROTID INNOMINATE BILAT MOD SED  05/29/2021   IR  ANGIO VERTEBRAL SEL VERTEBRAL BILAT MOD SED  05/29/2021    OB History   No obstetric history on file.      Home Medications    Prior to Admission medications   Medication Sig Start Date End Date Taking? Authorizing Provider  benzonatate (TESSALON) 200 MG capsule Take 1 capsule (200 mg total) by mouth 3 (three) times daily as needed for cough. 06/30/21  Yes Graviela Nodal, Myrene Galas, MD  albuterol (VENTOLIN HFA) 108 (90 Base) MCG/ACT inhaler Inhale 2 puffs into the lungs every 4 (four) hours as needed for wheezing or shortness of breath. 06/26/21   Barrett Henle, MD  diclofenac Sodium (VOLTAREN) 1 % GEL Apply 1 application. topically 4 (four) times daily as needed (pain).    [provider]  ferrous sulfate 325 (65 FE) MG tablet Take 325 mg by mouth daily.    [provider]  fluticasone (FLONASE) 50 MCG/ACT nasal spray Place 1 spray into both nostrils daily as needed for allergies or rhinitis.    [provider]  gabapentin (NEURONTIN) 300 MG capsule Take 300 mg by mouth every 8 (eight) hours.    [provider]  glimepiride (AMARYL) 1 MG tablet Take 1 mg by mouth daily with breakfast.    [provider]  hydroxychloroquine (PLAQUENIL) 200 MG tablet Take 1 tablet (200 mg total) by mouth daily. 01/06/15   Kelvin Cellar,  MD  ibuprofen (ADVIL) 200 MG tablet Take 200 mg by mouth every 6 (six) hours as needed for moderate pain or headache.    [provider]  levothyroxine (SYNTHROID) 100 MCG tablet Take 100 mcg by mouth daily. 02/05/21   [provider]  lisinopril (ZESTRIL) 10 MG tablet Take 10 mg by mouth daily.    [provider]  naproxen sodium (ALEVE) 220 MG tablet Take 220 mg by mouth daily as needed (pain).    [provider]  omeprazole (PRILOSEC) 20 MG capsule Take 20 mg by mouth daily before breakfast.    [provider]  OVER THE COUNTER MEDICATION Take 1 tablet by mouth daily as needed (allergies).  Flonase headache & allergy relief    [provider]  Tiotropium Bromide Monohydrate (SPIRIVA RESPIMAT) 1.25 MCG/ACT AERS Inhale 2 puffs into the lungs daily. Patient not taking: Reported on 09/15/2019 08/17/19 11/29/19  Brand Males, MD    Family History Family History  Problem Relation Age of Onset   Cancer Father        ? type   Asthma Brother     Social History Social History   Tobacco Use   Smoking status: Never   Smokeless tobacco: Former    Types: Snuff    Quit date: 02/03/2015  Vaping Use   Vaping Use: Never used  Substance Use Topics   Alcohol use: No   Drug use: No     Allergies   Ciprofloxacin and Crestor [rosuvastatin]   Review of Systems Review of Systems  Constitutional:  Positive for fatigue. Negative for activity change, fever and unexpected weight change.  HENT: Negative.    Respiratory:  Positive for cough and chest tightness. Negative for shortness of breath and wheezing.   Cardiovascular:  Positive for chest pain.  Gastrointestinal: Negative.   Genitourinary: Negative.   Neurological: Negative.     Physical Exam Triage Vital Signs ED Triage Vitals  Enc Vitals Group     BP 06/30/21 1129 (!) 180/100     Pulse Rate 06/30/21 1129 (!) 101     Resp 06/30/21 1129 16     Temp 06/30/21 1129 99.1 F (37.3 C)     Temp Source 06/30/21 1129 Oral     SpO2 06/30/21 1129 92 %     Weight --      Height --      Head Circumference --      Peak Flow --      Pain Score 06/30/21 1128 4     Pain Loc --      Pain Edu? --      Excl. in [place]? --    No data found.  Updated Vital Signs BP (!) 180/100 (BP Location: Left Arm)   Pulse (!) 101   Temp 99.1 F (37.3 C) (Oral)   Resp 16   SpO2 92%   Visual Acuity Right Eye Distance:   Left Eye Distance:   Bilateral Distance:    Right Eye Near:   Left Eye Near:    Bilateral Near:     Physical Exam Vitals and nursing note reviewed.  Constitutional:      General: She is not in acute  distress.    Appearance: She is not ill-appearing.  HENT:     Right Ear: Tympanic membrane normal.     Left Ear: Tympanic membrane normal.     Mouth/Throat:     Pharynx: No posterior oropharyngeal erythema.  Cardiovascular:  Rate and Rhythm: Normal rate and regular rhythm.     Pulses: Normal pulses.     Heart sounds: Normal heart sounds.  Pulmonary:     Effort: Pulmonary effort is normal. No respiratory distress.     Breath sounds: Normal breath sounds. No stridor.  Abdominal:     General: Bowel sounds are normal.     Palpations: Abdomen is soft.  Musculoskeletal:        General: No swelling or tenderness. Normal range of motion.  Neurological:     Mental Status: She is alert.     UC Treatments / Results  Labs (all labs ordered are listed, but only abnormal results are displayed) Labs Reviewed - No data to display  EKG   Radiology No results found.  Procedures Procedures (including critical care time)  Medications Ordered in UC Medications - No data to display  Initial Impression / Assessment and Plan / UC Course  I have reviewed the triage vital signs and the nursing notes.  Pertinent labs & imaging results that were available during my care of the patient were reviewed by me and considered in my medical decision making (see chart for details).     1.  Postviral cough syndrome: No indication for follow-up chest x-ray Tessalon Perles as needed for cough Increase oral fluid intake Humidifier use will help with cough and chest congestion Return to urgent care if symptoms worsen The cough will improve over the next 7 to 10 days. Final Clinical Impressions(s) / UC Diagnoses   Final diagnoses:  Post-viral cough syndrome     Discharge Instructions      Please take medications as prescribed Increase oral fluid intake Cough may take a couple of weeks to a few weeks to resolve. No indication for chest x-ray at this time Return to urgent care if you have  any other symptoms.   ED Prescriptions     Medication Sig Dispense Auth. Provider   benzonatate (TESSALON) 200 MG capsule Take 1 capsule (200 mg total) by mouth 3 (three) times daily as needed for cough. 30 capsule Takayla Baillie, Myrene Galas, MD      PDMP not reviewed this encounter.   Chase Picket, MD 06/30/21 [phone]

## 2021-06-30 NOTE — Discharge Instructions (Addendum)
Please take medications as prescribed Increase oral fluid intake Cough may take a couple of weeks to a few weeks to resolve. No indication for chest x-ray at this time Return to urgent care if you have any other symptoms.

## 2021-07-02 ENCOUNTER — Emergency Department (HOSPITAL_COMMUNITY)
Admission: EM | Admit: 2021-07-02 | Discharge: 2021-07-02 | Disposition: A | Discharge status: Home / Self Care | Payer: Medicare Other | Attending: Emergency Medicine | Admitting: Emergency Medicine

## 2021-07-02 ENCOUNTER — Emergency Department (HOSPITAL_COMMUNITY): Payer: Medicare Other

## 2021-07-02 ENCOUNTER — Other Ambulatory Visit: Payer: Self-pay

## 2021-07-02 DIAGNOSIS — J181 Lobar pneumonia, unspecified organism: Secondary | ICD-10-CM | POA: Diagnosis not present

## 2021-07-02 DIAGNOSIS — Z20822 Contact with and (suspected) exposure to covid-19: Secondary | ICD-10-CM | POA: Insufficient documentation

## 2021-07-02 DIAGNOSIS — Z7984 Long term (current) use of oral hypoglycemic drugs: Secondary | ICD-10-CM | POA: Insufficient documentation

## 2021-07-02 DIAGNOSIS — J8410 Pulmonary fibrosis, unspecified: Secondary | ICD-10-CM | POA: Diagnosis not present

## 2021-07-02 DIAGNOSIS — I471 Supraventricular tachycardia: Secondary | ICD-10-CM | POA: Diagnosis not present

## 2021-07-02 DIAGNOSIS — I10 Essential (primary) hypertension: Secondary | ICD-10-CM | POA: Diagnosis not present

## 2021-07-02 DIAGNOSIS — E119 Type 2 diabetes mellitus without complications: Secondary | ICD-10-CM | POA: Insufficient documentation

## 2021-07-02 DIAGNOSIS — R0602 Shortness of breath: Secondary | ICD-10-CM | POA: Diagnosis present

## 2021-07-02 DIAGNOSIS — J189 Pneumonia, unspecified organism: Secondary | ICD-10-CM

## 2021-07-02 DIAGNOSIS — Z79899 Other long term (current) drug therapy: Secondary | ICD-10-CM | POA: Insufficient documentation

## 2021-07-02 LAB — COMPREHENSIVE METABOLIC PANEL
ALT: 24 U/L (ref 0–44)
AST: 33 U/L (ref 15–41)
Albumin: 2.9 g/dL — ABNORMAL LOW (ref 3.5–5.0)
Alkaline Phosphatase: 88 U/L (ref 38–126)
Anion gap: 7 (ref 5–15)
BUN: 18 mg/dL (ref 8–23)
CO2: 24 mmol/L (ref 22–32)
Calcium: 8.9 mg/dL (ref 8.9–10.3)
Chloride: 101 mmol/L (ref 98–111)
Creatinine, Ser: 1.31 mg/dL — ABNORMAL HIGH (ref 0.44–1.00)
GFR, Estimated: 41 mL/min — ABNORMAL LOW (ref >60)
Glucose, Bld: 156 mg/dL — ABNORMAL HIGH (ref 70–99)
Potassium: 4.1 mmol/L (ref 3.5–5.1)
Sodium: 132 mmol/L — ABNORMAL LOW (ref 135–145)
Total Bilirubin: 1 mg/dL (ref 0.3–1.2)
Total Protein: 7.8 g/dL (ref 6.5–8.1)

## 2021-07-02 LAB — RESP PANEL BY RT-PCR (FLU A&B, COVID) ARPGX2
Influenza A by PCR: NEGATIVE
Influenza B by PCR: NEGATIVE
SARS Coronavirus 2 by RT PCR: NEGATIVE

## 2021-07-02 LAB — TROPONIN I (HIGH SENSITIVITY)
Troponin I (High Sensitivity): 18 ng/L — ABNORMAL HIGH (ref <18)
Troponin I (High Sensitivity): 17 ng/L (ref <18)

## 2021-07-02 LAB — CBC WITH DIFFERENTIAL/PLATELET
Abs Immature Granulocytes: 0.29 10*3/uL — ABNORMAL HIGH (ref 0.00–0.07)
Basophils Absolute: 0 10*3/uL (ref 0.0–0.1)
Basophils Relative: 0 %
Eosinophils Absolute: 0.1 10*3/uL (ref 0.0–0.5)
Eosinophils Relative: 1 %
HCT: 37.1 % (ref 36.0–46.0)
Hemoglobin: 12.3 g/dL (ref 12.0–15.0)
Immature Granulocytes: 2 %
Lymphocytes Relative: 11 %
Lymphs Abs: 1.5 10*3/uL (ref 0.7–4.0)
MCH: 30.1 pg (ref 26.0–34.0)
MCHC: 33.2 g/dL (ref 30.0–36.0)
MCV: 90.7 fL (ref 80.0–100.0)
Monocytes Absolute: 1 10*3/uL (ref 0.1–1.0)
Monocytes Relative: 7 %
Neutro Abs: 10.9 10*3/uL — ABNORMAL HIGH (ref 1.7–7.7)
Neutrophils Relative %: 79 %
Platelets: 276 10*3/uL (ref 150–400)
RBC: 4.09 MIL/uL (ref 3.87–5.11)
RDW: 13.3 % (ref 11.5–15.5)
WBC: 13.8 10*3/uL — ABNORMAL HIGH (ref 4.0–10.5)
nRBC: 0 % (ref 0.0–0.2)

## 2021-07-02 LAB — MAGNESIUM: Magnesium: 2.1 mg/dL (ref 1.7–2.4)

## 2021-07-02 MED ORDER — DOXYCYCLINE HYCLATE 100 MG PO CAPS: 100.0000 mg | ORAL_CAPSULE | Freq: Two times a day (BID) | ORAL | 0 refills | Status: DC

## 2021-07-02 MED ORDER — DOXYCYCLINE HYCLATE 100 MG PO TABS
100.0000 mg | ORAL_TABLET | Freq: Once | ORAL | Status: AC
  Administered 2021-07-02: 100 mg via ORAL
  Filled 2021-07-02: qty 1

## 2021-07-02 NOTE — Discharge Instructions (Signed)
Please follow-up with Dr. Einar Gip this week if you continue to have fast heart rates or shortness of breath, ER for worsening symptoms  Doxycycline is an antibiotic which is taken twice a day, this treats bacterial infections that can cause staph infections, it treats sinus infections and some pneumonia.  In this case I would like for you to take the antibiotic exactly as prescribed until it is completed.  Please be aware that occasionally people will get a rash if they are in the sunlight for extended periods of time while taking this medicine.  Thank you for allowing [place] to treat you in the emergency department today.  After reviewing your examination and potential testing that was done it appears that you are safe to go home.  I would like for you to follow-up with your doctor within the next several days, have them obtain your results and follow-up with them to review all of these tests.  If you should develop severe or worsening symptoms return to the emergency department immediately

## 2021-07-02 NOTE — Progress Notes (Signed)
Patient presenting with COPD/pulmonary fibrosis and pneumonia. EMS EKG tracing reveals brief atrial tachycardia with rate variation.   Do not suspect A. Fib/ Patient being treated with albuterol and steroids and could have p precipitated atrial tachycardia.  No indication for anticoagulation.  We can follow her up in the outpatient basis and 2 to 3 weeks.

## 2021-07-02 NOTE — ED Triage Notes (Signed)
Pt bib [facility] from home c/o SOB on exertion. EMS found pt to in Afib RVR, pt does see a cardiologist unsure why. Pt recently diagnosed with bronchitis 2 weeks ago. Pt in NSR upon arrival.   EMS vitals 124/74 BP 99% O2 90-150s HR

## 2021-07-02 NOTE — ED Provider Notes (Signed)
[facility] EMERGENCY DEPARTMENT Provider Note   CSN: [id number] Arrival date & time: 07/02/21  1546     History  Chief Complaint  Patient presents with   Shortness of Breath    [name] is a 81 y.o. female.   Shortness of Breath  This patient is a 81 year old female, presents to the hospital with a complaint shortness of breath that specifically came on just prior to arrival.  She was at home in her usual state of health which unfortunately over the last 2 weeks means that she is coughing frequently.  During this time she tried to get up out of her recliner chair to walk across the room and became extremely dyspneic, she went back and sat down which prompted her call to paramedics.  The paramedics found the patient to be in an atrial arrhythmia, by the time they arrived to the hospital the patient had converted back into a normal sinus rhythm and most of her symptoms had resolved.  She states she is still coughing and feels like her heart races when she coughs too much.  The patient has visited the urgent care couple of times in the last 2 weeks but has not gotten any better with regards to her cough.  The first visit she received prednisone and albuterol but this did not help the second visit the patient was given benzonatate for ongoing cough, an x-ray done at the first visit did not show any signs of infiltrate.  The patient denies fevers, she denies chest pain, she denies sore throat, she states there are 2 young children in the house to keep a runny nose  Home Medications Prior to Admission medications   Medication Sig Start Date End Date Taking? Authorizing Provider  albuterol (VENTOLIN HFA) 108 (90 Base) MCG/ACT inhaler Inhale 2 puffs into the lungs every 4 (four) hours as needed for wheezing or shortness of breath. 06/26/21   Barrett Henle, MD  benzonatate (TESSALON) 200 MG capsule Take 1 capsule (200 mg total) by mouth 3 (three) times daily as needed for  cough. 06/30/21   LampteyMyrene Galas, MD  diclofenac Sodium (VOLTAREN) 1 % GEL Apply 1 application. topically 4 (four) times daily as needed (pain).    [provider]  doxycycline (VIBRAMYCIN) 100 MG capsule Take 1 capsule (100 mg total) by mouth 2 (two) times daily for 7 days. 07/02/21 07/09/21  Noemi Chapel, MD  ferrous sulfate 325 (65 FE) MG tablet Take 325 mg by mouth daily.    [provider]  fluticasone (FLONASE) 50 MCG/ACT nasal spray Place 1 spray into both nostrils daily as needed for allergies or rhinitis.    [provider]  gabapentin (NEURONTIN) 300 MG capsule Take 300 mg by mouth every 8 (eight) hours.    [provider]  glimepiride (AMARYL) 1 MG tablet Take 1 mg by mouth daily with breakfast.    [provider]  hydroxychloroquine (PLAQUENIL) 200 MG tablet Take 1 tablet (200 mg total) by mouth daily. 01/06/15   Kelvin Cellar, MD  ibuprofen (ADVIL) 200 MG tablet Take 200 mg by mouth every 6 (six) hours as needed for moderate pain or headache.    [provider]  levothyroxine (SYNTHROID) 100 MCG tablet Take 100 mcg by mouth daily. 02/05/21   [provider]  lisinopril (ZESTRIL) 10 MG tablet Take 10 mg by mouth daily.    [provider]  naproxen sodium (ALEVE) 220 MG tablet Take 220 mg by  mouth daily as needed (pain).    [provider]  omeprazole (PRILOSEC) 20 MG capsule Take 20 mg by mouth daily before breakfast.    [provider]  OVER THE COUNTER MEDICATION Take 1 tablet by mouth daily as needed (allergies). Flonase headache & allergy relief    [provider]  Tiotropium Bromide Monohydrate (SPIRIVA RESPIMAT) 1.25 MCG/ACT AERS Inhale 2 puffs into the lungs daily. Patient not taking: Reported on 09/15/2019 08/17/19 11/29/19  Brand Males, MD      Allergies    Ciprofloxacin and Crestor [rosuvastatin]    Review of Systems   Review of Systems  Respiratory:  Positive for  shortness of breath.   All other systems reviewed and are negative.  Physical Exam Updated Vital Signs BP (!) 141/78   Pulse 84   Temp 98.9 F (37.2 C) (Oral)   Resp (!) 25   SpO2 95%  Physical Exam Vitals and nursing note reviewed.  Constitutional:      General: She is not in acute distress.    Appearance: She is well-developed.  HENT:     Head: Normocephalic and atraumatic.     Mouth/Throat:     Pharynx: No oropharyngeal exudate.  Eyes:     General: No scleral icterus.       Right eye: No discharge.        Left eye: No discharge.     Conjunctiva/sclera: Conjunctivae normal.     Pupils: Pupils are equal, round, and reactive to light.  Neck:     Thyroid: No thyromegaly.     Vascular: No JVD.  Cardiovascular:     Rate and Rhythm: Normal rate and regular rhythm.     Heart sounds: Normal heart sounds. No murmur heard.   No friction rub. No gallop.     Comments: Heart rate is 93-94 in sinus rhythm with normal pulses Pulmonary:     Effort: Pulmonary effort is normal. No respiratory distress.     Breath sounds: Rales present. No wheezing.     Comments: Speaks in full sentences, no tachypnea, rales at the right base Abdominal:     General: Bowel sounds are normal. There is no distension.     Palpations: Abdomen is soft. There is no mass.     Tenderness: There is no abdominal tenderness.  Musculoskeletal:        General: No tenderness. Normal range of motion.     Cervical back: Normal range of motion and neck supple.     Right lower leg: No edema.     Left lower leg: No edema.  Lymphadenopathy:     Cervical: No cervical adenopathy.  Skin:    General: Skin is warm and dry.     Findings: No erythema or rash.  Neurological:     Mental Status: She is alert.     Coordination: Coordination normal.  Psychiatric:        Behavior: Behavior normal.    ED Results / Procedures / Treatments   Labs (all labs ordered are listed, but only abnormal results are displayed) Labs  Reviewed  CBC WITH DIFFERENTIAL/PLATELET - Abnormal; Notable for the following components:      Result Value   WBC 13.8 (*)    Neutro Abs 10.9 (*)    Abs Immature Granulocytes 0.29 (*)    All other components within normal limits  COMPREHENSIVE METABOLIC PANEL - Abnormal; Notable for the following components:   Sodium 132 (*)    Glucose, Bld 156 (*)  Creatinine, Ser 1.31 (*)    Albumin 2.9 (*)    GFR, Estimated 41 (*)    All other components within normal limits  TROPONIN I (HIGH SENSITIVITY) - Abnormal; Notable for the following components:   Troponin I (High Sensitivity) 18 (*)    All other components within normal limits  RESP PANEL BY RT-PCR (FLU A&B, COVID) ARPGX2  MAGNESIUM  TROPONIN I (HIGH SENSITIVITY)    EKG EKG Interpretation  Date/Time:  Wednesday July 02 2021 15:49:59 EDT Ventricular Rate:  90 PR Interval:  162 QRS Duration: 83 QT Interval:  343 QTC Calculation: 420 R Axis:   7 Text Interpretation: Sinus tachycardia Atrial premature complexes Low voltage, precordial leads Probable anteroseptal infarct, old Nonspecific T abnormalities, lateral leads Confirmed by Noemi Chapel [phone]) on 07/02/2021 6:56:39 PM  Radiology DG Chest Portable 1 View  Result Date: 07/02/2021 CLINICAL DATA:  sob EXAM: PORTABLE CHEST 1 VIEW COMPARISON:  June one twenty FINDINGS: Mild cardiomegaly. As before, again seen is peribronchial thickening. There are some interstitial changes seen and are more prominent at the lung bases with likely superimposed mild airspace disease at the right lung base. IMPRESSION: Mild cardiomegaly. Interstitial changes and are more prominent at the lung bases with likely superimposed infiltrations at the right lung base. Electronically Signed   By: Frazier Richards M.D.   On: 07/02/2021 16:14    Procedures Procedures    Medications Ordered in ED Medications  doxycycline (VIBRA-TABS) tablet 100 mg (100 mg Oral Given 07/02/21 1923)    ED Course/ Medical Decision  Making/ A&P                           Medical Decision Making Risk Prescription drug management.   This patient presents to the ED for concern of ongoing shortness of breath, no arrhythmia, this involves an extensive number of treatment options, and is a complaint that carries with it a high risk of complications and morbidity.  The differential diagnosis includes pneumonia, would also consider that this patient had a primary atrial arrhythmia.  Reviewing the patient's prehospital cardiac strip that shows that she was in atrial flutter at a rate of 150 bpm with occasional 3-1 block as well but mostly 2-1.  Her rhythm on arrival was normal sinus at a rate of about 93 bpm   Co morbidities that complicate the patient evaluation  Hypertension, acid reflux, diabetes on oral medications   Additional history obtained:  Additional history obtained from electronic medical record and prior imaging External records from outside source obtained and reviewed including prior x-ray which showed no signs of pneumonia Additionally the patient's medical record was evaluated and she has been seen by cardiology as recently as the last several months during which time they saw her for her dyspnea on exertion, it is known that 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  She is also known to have lupus and is on medications for this. She has had coronary stress test which have been unremarkable, she is seen pulmonology because of a history of pulmonary fibrosis   Lab Tests:  I Ordered, and personally interpreted labs.  The pertinent results include:  sepsis w/u.  Cardiac w/u   Imaging Studies ordered:  I ordered imaging studies including portable chest xray  I independently visualized and interpreted imaging which showed possible infiltrate at the right base but bilateral pulmonary fibrosis I agree with the radiologist interpretation  Cardiac Monitoring: /  EKG:  The patient was maintained on a cardiac monitor.  I personally viewed and interpreted the cardiac monitored which showed an underlying rhythm of: What appears to be sinus rhythm   Consultations Obtained:  I requested consultation with the cardiologist Dr. Einar Gip,  and discussed lab and imaging findings as well as pertinent plan - they recommend: Outpatient treatment and follow-up in the cardiology office   Problem List / ED Course / Critical interventions / Medication management  Patient given doxycycline for possible pneumonia, remained in a normal rhythm throughout with normal blood pressure I ordered medication including doxycycline for pneumonia Reevaluation of the patient after these medicines showed that the patient improved I have reviewed the patients home medicines and have made adjustments as needed   Social Determinants of Health:  None   Test / Admission - Considered:  Considered admission but after discussion with cardiology the patient does not need to be admitted for the transient tacky arrhythmia.  With regards to the pneumonia the patient is very stable can be discharged home.  Not requiring oxygen         Final Clinical Impression(s) / ED Diagnoses Final diagnoses:  Community acquired pneumonia of right lower lobe of lung  Pulmonary fibrosis ([place])  Atrial tachycardia ([place])    Rx / DC Orders ED Discharge Orders          Ordered    doxycycline (VIBRAMYCIN) 100 MG capsule  2 times daily,   Status:  Discontinued        07/02/21 1955    doxycycline (VIBRAMYCIN) 100 MG capsule  2 times daily        07/02/21 1955              Noemi Chapel, MD 07/02/21 [phone]

## 2021-07-03 ENCOUNTER — Encounter: Payer: Self-pay | Admitting: Student

## 2021-07-03 ENCOUNTER — Ambulatory Visit (INDEPENDENT_AMBULATORY_CARE_PROVIDER_SITE_OTHER): Payer: Medicare Other | Admitting: Student

## 2021-07-03 VITALS — BP 150/74 | HR 93 | Temp 99.3°F | Ht 67.0 in | Wt 152.0 lb

## 2021-07-03 DIAGNOSIS — R0600 Dyspnea, unspecified: Secondary | ICD-10-CM | POA: Diagnosis not present

## 2021-07-03 DIAGNOSIS — J189 Pneumonia, unspecified organism: Secondary | ICD-10-CM | POA: Diagnosis not present

## 2021-07-03 DIAGNOSIS — J849 Interstitial pulmonary disease, unspecified: Secondary | ICD-10-CM | POA: Diagnosis not present

## 2021-07-03 MED ORDER — ALBUTEROL SULFATE HFA 108 (90 BASE) MCG/ACT IN AERS: 2.0000 | INHALATION_SPRAY | RESPIRATORY_TRACT | 6 refills | Status: DC | PRN

## 2021-07-03 MED ORDER — IPRATROPIUM-ALBUTEROL 0.5-2.5 (3) MG/3ML IN SOLN: 3.0000 mL | Freq: Four times a day (QID) | RESPIRATORY_TRACT | 6 refills | Status: DC | PRN

## 2021-07-03 MED ORDER — AMOXICILLIN-POT CLAVULANATE 875-125 MG PO TABS: 1.0000 | ORAL_TABLET | Freq: Two times a day (BID) | ORAL | 0 refills | Status: AC

## 2021-07-03 MED ORDER — AZITHROMYCIN 250 MG PO TABS: ORAL_TABLET | ORAL | 0 refills | Status: DC

## 2021-07-03 NOTE — Patient Instructions (Addendum)
-   Prescription sent for augmentin, azithromycin to treat pneumonia - head to the lab today - duonebs breathing treatments as needed, follow them with flutter valve 10 slow but firm puffs twice daily  - consider over the counter medicines with dextromethorphan for treatment of cough - see you in July

## 2021-07-03 NOTE — Progress Notes (Signed)
Synopsis: Referred for cough by Carol Ada, MD  Subjective:   PATIENT ID: [name] GENDER: female DOB: 23-Sep-1940, MRN: [id number]  Chief Complaint  Patient presents with   Acute Visit    Increased SOB and cough x 2 wks. Went to ED 07/02/21- dx with right infiltrate and prescribed doxy but she is hesitant about taking due to her lupus. Her cough has been prod with white sputum.    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 Chase Caller and considering antifibrotic, diastolic dysfunction, DM, breast cancer, hypothyroid.  Cough nonstop over last couple of weeks, increased dyspnea. Cough is still productive, pleuritic pain. Seen in urgent care 6/1 (given 3d prednisone which per pt was not helpful), 6/5 for cough (given tessalon perles for presumed postviral cough), nasal congestion. Seen in [facility] ED yesterday given doxy for CAP. Covid negative. No fever, hemoptysis.   Otherwise pertinent review of systems is negative.    Past Medical History:  Diagnosis Date   Breast cancer ([place])    Diabetes mellitus without complication ([place])    Hypertension    Hypothyroidism    Lupus ([place])      Family History  Problem Relation Age of Onset   Cancer Father        ? type   Asthma Brother      Past Surgical History:  Procedure Laterality Date   BREAST LUMPECTOMY  2009   IR 3D INDEPENDENT WKST  05/29/2021   IR ANGIO INTRA EXTRACRAN SEL COM CAROTID INNOMINATE BILAT MOD SED  05/29/2021   IR ANGIO VERTEBRAL SEL VERTEBRAL BILAT MOD SED  05/29/2021    Social History   Socioeconomic History   Marital status: Single    Spouse name: Not on file   Number of children: 2   Years of education: Not on file   Highest education level: Not on file  Occupational History   Not on file  Tobacco Use   Smoking status: Never   Smokeless tobacco: Former    Types: Snuff    Quit date: 02/03/2015  Vaping Use   Vaping Use: Never used   Substance and Sexual Activity   Alcohol use: No   Drug use: No   Sexual activity: Not on file  Other Topics Concern   Not on file  Social History Narrative   Not on file   Social Determinants of Health   Financial Resource Strain: Not on file  Food Insecurity: Not on file  Transportation Needs: Unmet Transportation Needs (06/02/2021)   PRAPARE - Hydrologist (Medical): Yes    Lack of Transportation (Non-Medical): No  Physical Activity: Not on file  Stress: Not on file  Social Connections: Not on file  Intimate Partner Violence: Not on file     Allergies  Allergen Reactions   Ciprofloxacin Itching, Swelling and Other (See Comments)    Possibly causing tremors?   Crestor [Rosuvastatin] Other (See Comments)    Myalgia and back pain     Outpatient Medications Prior to Visit  Medication Sig Dispense Refill   benzonatate (TESSALON) 200 MG capsule Take 1 capsule (200 mg total) by mouth 3 (three) times daily as needed for cough. 30 capsule 0   diclofenac Sodium (VOLTAREN) 1 % GEL Apply 1 application. topically 4 (four) times daily as needed (pain).     ferrous sulfate 325 (65 FE) MG tablet Take 325  mg by mouth daily.     fluticasone (FLONASE) 50 MCG/ACT nasal spray Place 1 spray into both nostrils daily as needed for allergies or rhinitis.     gabapentin (NEURONTIN) 300 MG capsule Take 300 mg by mouth every 8 (eight) hours.     glimepiride (AMARYL) 1 MG tablet Take 1 mg by mouth daily with breakfast.     hydroxychloroquine (PLAQUENIL) 200 MG tablet Take 1 tablet (200 mg total) by mouth daily. 30 tablet 0   ibuprofen (ADVIL) 200 MG tablet Take 200 mg by mouth every 6 (six) hours as needed for moderate pain or headache.     levothyroxine (SYNTHROID) 100 MCG tablet Take 100 mcg by mouth daily.     lisinopril (ZESTRIL) 10 MG tablet Take 10 mg by mouth daily.     naproxen sodium (ALEVE) 220 MG tablet Take 220 mg by mouth daily as needed (pain).      omeprazole (PRILOSEC) 20 MG capsule Take 20 mg by mouth daily before breakfast.     OVER THE COUNTER MEDICATION Take 1 tablet by mouth daily as needed (allergies). Flonase headache & allergy relief     albuterol (VENTOLIN HFA) 108 (90 Base) MCG/ACT inhaler Inhale 2 puffs into the lungs every 4 (four) hours as needed for wheezing or shortness of breath. 18 g 0   doxycycline (VIBRAMYCIN) 100 MG capsule Take 1 capsule (100 mg total) by mouth 2 (two) times daily for 7 days. 14 capsule 0   No facility-administered medications prior to visit.       Objective:   Physical Exam:  General appearance: 81 y.o., female, NAD, conversant  Eyes: anicteric sclerae; PERRL, tracking appropriately HENT: NCAT; MMM Neck: Trachea midline; no lymphadenopathy, no JVD Lungs: CTAB, no crackles, no wheeze, with normal respiratory effort CV: RRR, no murmur  Abdomen: Soft, non-tender; non-distended, BS present  Extremities: No peripheral edema, warm Skin: Normal turgor and texture; no rash Psych: Appropriate affect Neuro: Alert and oriented to person and place, no focal deficit     Vitals:   07/03/21 1606  BP: (!) 150/74  Pulse: 93  Temp: 99.3 F (37.4 C)  TempSrc: Oral  SpO2: 97%  Weight: 152 lb (68.9 kg)  Height: 5' 7"  (1.702 m)   97% on RA BMI Readings from Last 3 Encounters:  07/03/21 23.81 kg/m  06/03/21 24.24 kg/m  05/29/21 23.81 kg/m   Wt Readings from Last 3 Encounters:  07/03/21 152 lb (68.9 kg)  06/03/21 153 lb 9.6 oz (69.7 kg)  05/29/21 152 lb (68.9 kg)     CBC    Component Value Date/Time   WBC 13.8 (H) 07/02/2021 1624   RBC 4.09 07/02/2021 1624   HGB 12.3 07/02/2021 1624   HGB 11.7 (L) 05/07/2021 1345   HCT 37.1 07/02/2021 1624   PLT 276 07/02/2021 1624   PLT 213 05/07/2021 1345   MCV 90.7 07/02/2021 1624   MCH 30.1 07/02/2021 1624   MCHC 33.2 07/02/2021 1624   RDW 13.3 07/02/2021 1624   LYMPHSABS 1.5 07/02/2021 1624   MONOABS 1.0 07/02/2021 1624   EOSABS 0.1  07/02/2021 1624   BASOSABS 0.0 07/02/2021 1624      Chest Imaging: CXR with R>L basilar alveolar opacities, R perihilar opacity  Pulmonary Functions Testing Results:    Latest Ref Rng & Units 06/03/2021   10:37 AM 02/14/2021   12:36 PM 05/18/2017   12:44 PM  PFT Results  FVC-Pre L 1.90  1.84  1.61   FVC-Predicted Pre %  80  76  63   Pre FEV1/FVC % % 82  84  93   FEV1-Pre L 1.56  1.54  1.51   FEV1-Predicted Pre % 84  82  76   DLCO uncorrected ml/min/mmHg 9.75  9.78    DLCO UNC% % 47  47    DLCO corrected ml/min/mmHg 10.11  9.78    DLCO COR %Predicted % 48  47    DLVA Predicted % 76  70        Assessment & Plan:   # Suspected CAP Lower suspicion for AE-ILD she is not hypoxic, CHF - euvolemic and weight is stable/decreased.   Plan: - check BNP - augmentin, azithro - she requests nebulizer, let's try duonebs at least twice daily - flutter valve 10 slow but firm puffs twice daily after each duoneb treatment - she say steroids were not helpful for her and she's not wheezy on exam, we'll hold off.  - RTC later this summer for discussion re: starting antifibrotic for progressive fibrotic phenotype (looks progressed radiographically however lung function appears stable)      Maryjane Hurter, MD [facility] 07/03/2021 4:34 PM

## 2021-07-04 LAB — BRAIN NATRIURETIC PEPTIDE: Pro B Natriuretic peptide (BNP): 148 pg/mL — ABNORMAL HIGH (ref 0.0–100.0)

## 2021-07-10 ENCOUNTER — Telehealth: Payer: Self-pay | Admitting: Student

## 2021-07-11 MED ORDER — IPRATROPIUM-ALBUTEROL 0.5-2.5 (3) MG/3ML IN SOLN: 3.0000 mL | Freq: Four times a day (QID) | RESPIRATORY_TRACT | 6 refills | Status: DC | PRN

## 2021-07-11 NOTE — Telephone Encounter (Signed)
Called and spoke with patient who states that her pharmacy does not have the Duoneb in stock. Advised her that I can send it to [facility] and we can see if she can get it from them. She expressed understanding. RX has been sent. Nothing further needed at this time.

## 2021-07-14 ENCOUNTER — Ambulatory Visit: Payer: Medicare Other | Admitting: Student

## 2021-07-14 ENCOUNTER — Encounter: Payer: Self-pay | Admitting: Student

## 2021-07-14 ENCOUNTER — Inpatient Hospital Stay: Payer: Medicare Other

## 2021-07-14 ENCOUNTER — Telehealth: Payer: Self-pay | Admitting: Student

## 2021-07-14 VITALS — BP 183/91 | HR 63 | Temp 97.8°F | Resp 16 | Ht 67.0 in | Wt 150.4 lb

## 2021-07-14 DIAGNOSIS — R0609 Other forms of dyspnea: Secondary | ICD-10-CM

## 2021-07-14 DIAGNOSIS — R002 Palpitations: Secondary | ICD-10-CM

## 2021-07-14 DIAGNOSIS — J849 Interstitial pulmonary disease, unspecified: Secondary | ICD-10-CM

## 2021-07-14 MED ORDER — AMLODIPINE BESYLATE 2.5 MG PO TABS: 2.5000 mg | ORAL_TABLET | Freq: Every day | ORAL | 3 refills | Status: DC

## 2021-07-14 NOTE — Telephone Encounter (Signed)
Called pt but when the call connected, pt was unable to hear me on the other end. Tried to call back but the same thing happened and this time due to pt not able to hear me, she hung up.  Will try to call pt back later to verify insurance information prior to sending Rx for her duoneb to local pharmacy.

## 2021-07-14 NOTE — Progress Notes (Signed)
Primary Physician/Referring:  Carol Ada, MD  Patient ID: [name], female    DOB: Sep 28, 1940, 81 y.o.   MRN: [id number]  Chief Complaint  Patient presents with  . Hospitalization Follow-up   HPI:    [name]  is a 81 y.o. 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  She was treated with Xarelto for repeated of 6 months.  Patient was last seen in the office 04/17/2021 by Dr. Einar Gip at which time she was advised to follow-up as needed.  Since then patient has follow-up with pulmonology for management of ILD and was recently diagnosed with community-acquired pneumonia 07/02/2021.  She now presents with complaints of shortness of breath.  During evaluation in the ED provider noted EMS EKG to be concerning for atrial flutter and advised patient follow-up with cardiology.  Patient states she continues to have shortness of breath and occasional palpitations but this has been improving with treatment of pneumonia.  She continues to follow closely with pulmonology.  Denies chest pain, syncope, near syncope.  Past Medical History:  Diagnosis Date  . Breast cancer ([place])   . Diabetes mellitus without complication ([place])   . Hypertension   . Hypothyroidism   . Lupus [facility])    Past Surgical History:  Procedure Laterality Date  . BREAST LUMPECTOMY  2009  . IR 3D INDEPENDENT WKST  05/29/2021  . IR ANGIO INTRA EXTRACRAN SEL COM CAROTID INNOMINATE BILAT MOD SED  05/29/2021  . IR ANGIO VERTEBRAL SEL VERTEBRAL BILAT MOD SED  05/29/2021   Family History  Problem Relation Age of Onset  .  Cancer Father        ? type  . Asthma Brother     Social History   Tobacco Use  . Smoking status: Never  . Smokeless tobacco: Former    Types: Snuff    Quit date: 02/03/2015  Substance Use Topics  . Alcohol use: No   Marital Status: Single  ROS  Review of Systems  Cardiovascular:  Positive for dyspnea on exertion and palpitations. Negative for chest pain and leg swelling.  Respiratory:  Positive for wheezing.   Musculoskeletal:  Positive for arthritis and back pain.  Gastrointestinal:  Negative for melena.   Objective  Blood pressure (!) 183/91, pulse 63, temperature 97.8 F (36.6 C), temperature source Temporal, resp. rate 16, height _0  (1.702 m), weight 150 lb 6.4 oz (68.2 kg), SpO2 98 %.     07/14/2021    8:47 AM 07/14/2021    8:36 AM 07/03/2021    4:06 PM  Vitals with BMI  Height  _1  _2   Weight  150 lbs 6 oz 152 lbs  BMI  33.82 50.5  Systolic 397 673 419  Diastolic 91 76 74  Pulse 63 78 93     Physical Exam Vitals reviewed.  Neck:     Vascular: No carotid bruit or JVD.  Cardiovascular:     Rate and Rhythm: Normal rate and regular rhythm.     Pulses: Intact distal pulses.     Heart sounds: Normal heart sounds. No murmur heard.    No gallop.  Pulmonary:  Effort: Pulmonary effort is normal.     Breath sounds: Rales (coarse bilateral basal leathery crackles) present.  Musculoskeletal:     Cervical back: Neck supple.     Right lower leg: No edema.     Left lower leg: No edema.   Laboratory examination:   Recent Labs    05/07/21 1345 05/29/21 0829 07/02/21 1624  NA 138 135 132*  K 3.6 3.8 4.1  CL 103 101 101  CO2 _0 GLUCOSE 115* 123* 156*  BUN _1 CREATININE 1.02* 1.10* 1.31*  CALCIUM 9.4 9.5 8.9  GFRNONAA 56* 51* 41*   estimated creatinine clearance is 33.9 mL/min (A) (by C-G formula based on SCr of 1.31 mg/dL (H)).     Latest Ref Rng & Units 07/02/2021    4:24 PM 05/29/2021    8:29 AM 05/07/2021    1:45 PM  CMP  Glucose 70 -  99 mg/dL 156  123  115   BUN 8 - 23 mg/dL _2 Creatinine 0.44 - 1.00 mg/dL 1.31  1.10  1.02   Sodium 135 - 145 mmol/L 132  135  138   Potassium 3.5 - 5.1 mmol/L 4.1  3.8  3.6   Chloride 98 - 111 mmol/L 101  101  103   CO2 22 - 32 mmol/L _3 Calcium 8.9 - 10.3 mg/dL 8.9  9.5  9.4   Total Protein 6.5 - 8.1 g/dL 7.8   9.1   Total Bilirubin 0.3 - 1.2 mg/dL 1.0   0.3   Alkaline Phos 38 - 126 U/L 88   63   AST 15 - 41 U/L 33   23   ALT 0 - 44 U/L 24   17       Latest Ref Rng & Units 07/02/2021    4:24 PM 05/29/2021    8:29 AM 05/07/2021    1:45 PM  CBC  WBC 4.0 - 10.5 K/uL 13.8  5.5  5.2   Hemoglobin 12.0 - 15.0 g/dL 12.3  12.3  11.7   Hematocrit 36.0 - 46.0 % 37.1  37.9  36.4   Platelets 150 - 400 K/uL 276  233  213    TSH No results for input(s): "TSH" in the last 8760 hours.  External labs:  Labs 06/25/2020:  A1c 7.5%.  Hb 11.3/HCT 33.7, platelets 234.  Normal indicis.  Serum glucose 122, BUN 13, creatinine 1.20, EGFR 46 mL, potassium 4.3, CMP otherwise normal.  Total cholesterol 104, triglycerides 113, HDL 37, LDL 67.  Non-HDL cholesterol 113.  Medications and allergies   Allergies  Allergen Reactions  . Ciprofloxacin Itching, Swelling and Other (See Comments)    Possibly causing tremors?  . Crestor [Rosuvastatin] Other (See Comments)    Myalgia and back pain     Current Outpatient Medications:  .  albuterol (VENTOLIN HFA) 108 (90 Base) MCG/ACT inhaler, Inhale 2 puffs into the lungs every 4 (four) hours as needed for wheezing or shortness of breath., Disp: 18 g, Rfl: 6 .  amLODipine (NORVASC) 2.5 MG tablet, Take 1 tablet (2.5 mg total) by mouth daily., Disp: 30 tablet, Rfl: 3 .  diclofenac Sodium (VOLTAREN) 1 % GEL, Apply 1 application. topically 4 (four) times daily as needed (pain)., Disp: , Rfl:  .  ferrous sulfate 325 (65 FE) MG tablet, Take 325 mg by mouth daily., Disp: , Rfl:  .  fluticasone (FLONASE) 50 MCG/ACT nasal  spray, Place 1 spray into  both nostrils daily as needed for allergies or rhinitis., Disp: , Rfl:  .  gabapentin (NEURONTIN) 300 MG capsule, Take 300 mg by mouth every 8 (eight) hours., Disp: , Rfl:  .  glimepiride (AMARYL) 1 MG tablet, Take 1 mg by mouth daily with breakfast., Disp: , Rfl:  .  hydroxychloroquine (PLAQUENIL) 200 MG tablet, Take 1 tablet (200 mg total) by mouth daily., Disp: 30 tablet, Rfl: 0 .  ibuprofen (ADVIL) 200 MG tablet, Take 200 mg by mouth every 6 (six) hours as needed for moderate pain or headache., Disp: , Rfl:  .  ipratropium-albuterol (DUONEB) 0.5-2.5 (3) MG/3ML SOLN, Take 3 mLs by nebulization every 6 (six) hours as needed., Disp: 360 mL, Rfl: 6 .  levothyroxine (SYNTHROID) 100 MCG tablet, Take 100 mcg by mouth daily., Disp: , Rfl:  .  lisinopril (ZESTRIL) 10 MG tablet, Take 10 mg by mouth daily., Disp: , Rfl:  .  omeprazole (PRILOSEC) 20 MG capsule, Take 20 mg by mouth daily before breakfast., Disp: , Rfl:  .  OVER THE COUNTER MEDICATION, Take 1 tablet by mouth daily as needed (allergies). Flonase headache & allergy relief, Disp: , Rfl:   Radiology:   CT head without contrast and CT cervical spine without contrast 02/18/2019: 1. No acute intracranial pathology. 2. No acute/traumatic cervical spine pathology. Multilevel degenerative changes most prominent at C4-C5.  HRCT chest 07/29/2019: 1. The appearance of the lungs is compatible with interstitial lung disease, with a spectrum of findings considered diagnostic of usual interstitial pneumonia (UIP) per current ATS guidelines. No significant progression of disease compared to the prior  examination. 2. Aortic atherosclerosis, in addition to left main and 3 vessel coronary artery disease. Assessment for potential risk factor modification, dietary therapy or pharmacologic therapy may be warranted, if clinically indicated. 3. Aortic Atherosclerosis (ICD10-I70.0).  Cardiac Studies:   Lexiscan myoview stress test 07/03/2016: 1. The resting  electrocardiogram demonstrated normal sinus rhythm, normal resting conduction, PVC and normal rest repolarization. Stress EKG is non-diagnostic for ischemia as it a pharmacologic stress using Lexiscan. Stress symptoms included dyspnea. 2. Myocardial perfusion imaging is normal. Overall left ventricular systolic function was normal without regional wall motion abnormalities. The left ventricular ejection fraction was 75%.   Carotid artery duplex  02/18/2019: Right Carotid: Velocities in the right ICA are consistent with a 1-39% stenosis. Left Carotid: Velocities in the left ICA are consistent with a 1-39% stenosis. Vertebrals:  Bilateral vertebral arteries demonstrate antegrade flow. Subclavians: Normal flow hemodynamics were seen in bilateral subclavian arteries.  PCV ECHOCARDIOGRAM COMPLETE 04/07/2021  Narrative Echocardiogram 04/07/2021: Normal LV systolic function with EF 57%. Left ventricle cavity is normal in size. Moderate concentric hypertrophy of the left ventricle. Normal global wall motion. Doppler evidence of grade I (impaired) diastolic dysfunction, normal LAP. Calculated EF 57%. Left atrial cavity is mildly dilated. Mild calcification of the mitral valve annulus. Mild mitral valve leaflet thickening with mild calcification especially of the anterior MV leaflet. Trace mitral regurgitation. No evidence of mitral valve stenosis. Structurally normal tricuspid valve.  Mild tricuspid regurgitation. No evidence of pulmonary hypertension. Structurally normal pulmonic valve.  Mild pulmonic regurgitation. No significant change from 10/10/2020.  EKG  EKG 04/17/2021: Normal sinus rhythm at the rate of 71 bpm, left atrial enlargement, left axis deviation, left anterior fascicular block.  Borderline progression, cannot exclude anteroseptal infarct old.  Nonspecific T abnormality in 1 and aVL. No significant change from EKG 12/05/2020   Assessment  ICD-10-CM   1. Palpitations  R00.2 LONG  TERM MONITOR (3-14 DAYS)    2. Dyspnea on exertion  R06.09     3. ILD (interstitial lung disease) ([place])  J84.9       Medications Discontinued During This Encounter  Medication Reason  . azithromycin (ZITHROMAX) 250 MG tablet Completed Course  . benzonatate (TESSALON) 200 MG capsule Completed Course  . naproxen sodium (ALEVE) 220 MG tablet     Meds ordered this encounter  Medications  . amLODipine (NORVASC) 2.5 MG tablet    Sig: Take 1 tablet (2.5 mg total) by mouth daily.    Dispense:  30 tablet    Refill:  3   Recommendations:   Torrie Namba is a 81 y.o. 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  She was treated with Xarelto for repeated of 6 months.  Patient was last seen in the office 04/17/2021 by Dr. Einar Gip at which time she was advised to follow-up as needed.  Since then patient has follow-up with pulmonology for management of ILD and was recently diagnosed with community-acquired pneumonia 07/02/2021.  She now presents with complaints of shortness of breath.  During evaluation in the ED provider noted EMS EKG to be concerning for atrial flutter and advised patient follow-up with cardiology.  SPECT patient's symptoms are primarily related to underlying lung pathology.  However given concern for atrial flutter by EMS EKG according to ED records, will obtain 2-week cardiac monitor to evaluate for underlying arrhythmias.    Patient's blood pressure is also uncontrolled, will start Motifene 2.5 mg p.o. daily.  Advised patient to monitor blood pressure daily at home and  bring a written log to her next appointment.  Follow-up in 4 weeks, sooner if needed.   Alethia Berthold, PA-C 07/14/2021, 9:29 AM Office: [phone]

## 2021-07-16 NOTE — Telephone Encounter (Signed)
Called and spoke with patient to ask her if anything has changed with her insurance. She states that she still has [employer] and then [employer] as secondary. Called and spoke with Colletta [place] to let her know that patient does have Medicare and that I can fax them over copy of her insurance card. They provided fax number of [phone]. Nothing further needed at this time.

## 2021-07-17 ENCOUNTER — Telehealth: Payer: Self-pay | Admitting: Student

## 2021-07-31 ENCOUNTER — Encounter: Payer: Self-pay | Admitting: Primary Care

## 2021-07-31 ENCOUNTER — Ambulatory Visit (INDEPENDENT_AMBULATORY_CARE_PROVIDER_SITE_OTHER): Payer: Medicare Other | Admitting: Primary Care

## 2021-07-31 ENCOUNTER — Ambulatory Visit (INDEPENDENT_AMBULATORY_CARE_PROVIDER_SITE_OTHER): Payer: Medicare Other

## 2021-07-31 VITALS — BP 136/74 | HR 69 | Temp 98.3°F | Ht 67.0 in | Wt 148.8 lb

## 2021-07-31 DIAGNOSIS — J189 Pneumonia, unspecified organism: Secondary | ICD-10-CM | POA: Insufficient documentation

## 2021-07-31 DIAGNOSIS — J849 Interstitial pulmonary disease, unspecified: Secondary | ICD-10-CM

## 2021-07-31 HISTORY — DX: Pneumonia, unspecified organism: J18.9

## 2021-07-31 NOTE — Assessment & Plan Note (Addendum)
-   Treated for CAP in June with Augmentin and Azithromycin. Acute respiratory symptoms have clinically resolved. Needs repeat CXR today.

## 2021-07-31 NOTE — Patient Instructions (Addendum)
Appears to have recovered from the pneumonia that you had in June.  We will repeat a chest x-ray today.  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  Lung function remains stable.  Plan is to continue to observe.  We began to discuss antifibrotic's which are intended to slow the progression of pulmonary fibrosis but could have potential side effects including nausea vomiting diarrhea or weight loss.  You have elected to think about starting this medication and discussed with Dr. Chase Caller this fall.  Recommendations: Stay as active as possible Notify us if cough or shortness of breath worsen  Orders: Simple walk with forehead probe - 3 laps on Room air  Chest x-ray today (ordered)  Follow-up: Recall is in for  October/Nov with Dr. Chase Caller  (30 mins)

## 2021-07-31 NOTE — Progress Notes (Signed)
_0  ID: [name], female    DOB: [birth year], 81 y.o.   MRN: [id number]  Chief Complaint  Patient presents with   Follow-up    SOB with fast ambulation     Referring provider: Carol Ada, MD  HPI: 82 year old female, never smoked.  Past medical history significant for hypertension, ILD, hypothyroidism, history of asthma, systemic lupus.  Patient Dr. Chase Caller, last seen in office on 07/03/2021 by Dr. Verlee Monte for community-acquired pneumonia.  Previous [facility] pulmonary encounter: 06/03/21- Dr. Chase Caller   06/03/2021 -       Chief Complaint  Patient presents with   Follow-up      PFT performed today.  Pt states she has been doing okay since last visit and denies any real complaints. States that every so often she will become SOB.      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 Normal PA/RV and LV             - similar sept 2022   HPI [name] 81 y.o. -returns for follow-up.  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  However symptomatically she was stable.  Therefore we decided to see her again with pulmonary function test.  Pulmonary function test shows continued stability [see below].  Symptom wise she is also stable without any change.  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  However in the 2021 European respiratory general review (https://err.ersjournals.com/content/30/[email or web address]/210177#T1)  -UIP is excluded from this classification unless there is specific clinical and serologic features.  She does  have serologic factors of positive SSA and positive ANA at 1: 320.  However she does not have specific clinical features particularly of Raynaud's or digital tip ulceration or digital tip fissuring or palmar telangiectasia early morning joint stiffness or Raynaud's or unexplained digital edema Gottron sign.  In talking to her she says her rheumatologist is not Dr. Dossie Der.  She is not sure if she has a specific autoimmune diagnosis.  But she does follow-up.  Last visit was March 2023.  I do not have the notes with me.  In the past she has been labeled as lupus before she moved to [place].  Within rheumatologist Dr. Trudie Reed her previous rheumatologist did not think she had any specific connective tissue disease.  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  Therefore we discussed antifibrotic therapy again.  Explained UIP is by prognostic marker and the preventative role of antifibrotic's.  Explained side effects.  She is not sure she wants to do it and she wants to reflect on it.  We discussed potential clinical trials and she wants to reflect on this as well.  07/03/21- Dr. Verlee Monte     Chief Complaint  Patient presents with   Acute Visit      Increased SOB and cough x 2 wks. Went to ED 07/02/21- dx with right infiltrate and prescribed doxy but she is hesitant about taking due to her lupus. Her cough has been prod with white sputum.     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 Chase Caller and considering antifibrotic, diastolic dysfunction, DM, breast cancer, hypothyroid.   Cough nonstop over last couple of weeks, increased dyspnea. Cough is still productive, pleuritic pain. Seen in urgent care 6/1 (given 3d prednisone which per pt was not helpful), 6/5 for cough (given tessalon perles for presumed postviral cough), nasal congestion. Seen in [facility] ED yesterday given doxy for CAP. Covid negative. No fever,  hemoptysis.    Otherwise pertinent review of systems is negative.    07/31/2021- Interim hx  Patient presents today for 1 month follow-up for CAP.   She was treated with Augmentin and azithromycin. She is doing well today. Feels better. Shortness of breath, cough and wheezing have resolved. She has chronic dyspnea with moderate exertion. No shortness of breath at rest or with simple ADLs. She has a rare dry cough. No oxygen requirements.    SYMPTOM SCALE - ILD 06/14/2019   08/17/2019   05/07/2020   02/14/2021   06/03/2021   07/31/2021   O2 use _0  RA  Shortness of Breath 0 -> 5 scale with 5 being worst (score 6 If unable to do)           At rest 0 2 0 0 2 0  Simple tasks - showers, clothes change, eating, shaving _1 Household (dishes, doing bed, laundry) _2 0  Shopping _3 Walking level at own pace _4 0  Walking up Stairs _5 Total (30-36) Dyspnea Score _6 How bad is your cough? 2   1 0 0 1  How bad is your fatigue _7 How bad is nausea 00   0 0 0 0  How bad is vomiting?   0   0 0 0 0  How bad is diarrhea? 0   0 0 0 0  How bad is anxiety? 0 0 0 2 0 3  How bad is depression 0 1 0 3 0 1       Simple office walk 185 feet x  3 laps goal with forehead probe 08/18/2017       02/15/2018   06/14/2019   05/07/2020   02/14/2021   06/03/21   07/31/2021   O2 used *room air Room air ra ra ra   RA  Number laps completed _8 Comments about pace Normal pace Slow pace Slow pace slow Slow pace   Slow pace   Resting Pulse Ox/HR 100% and 67/min 100% and 73/min 100% and 63/mi 100% and 67/min 100% and 64   100% and 71  Final Pulse Ox/HR 100% and 91/min 100% and 96/min 92% and 84/mi 100% and 91/ 100% and 91   98% and 88  Desaturated </= 88% no No  no no no   No  Desaturated <= 3% points no no Yues, 8 ponts no no   No  Got Tachycardic >/= 90/min yes yes no yes yes   No  Symptoms at end of test Mild dyspnea Mild  dyspnea Mild dyspnea Mild dyspnea Mild dyspnea   Mild dyspnea  Miscellaneous comments x x     x   x     Allergies  Allergen Reactions   Ciprofloxacin Itching, Swelling and Other (See Comments)    Possibly causing tremors?   Crestor [Rosuvastatin] Other (See Comments)    Myalgia and back pain    Immunization History  Administered Date(s) Administered   Influenza, High Dose Seasonal PF 11/09/2016, 10/27/2018, 10/26/2020   Influenza,inj,Quad PF,6+ Mos 10/27/2015   PFIZER(Purple Top)SARS-COV-2 Vaccination 04/22/2019, 05/13/2019, 01/15/2020   Pneumococcal Conjugate-13 07/11/2013, 02/15/2018   Pneumococcal Polysaccharide-23 03/18/2020   Tdap 03/20/2021    Past Medical History:  Diagnosis Date   Breast cancer ([place])    Diabetes mellitus without complication ([place])    Hypertension    Hypothyroidism    Lupus ([place])     Tobacco History: Social History   Tobacco Use  Smoking Status Never  Smokeless Tobacco Former   Types: Snuff   Quit date: 02/03/2015   Counseling given: Not Answered   Outpatient Medications Prior to Visit  Medication Sig Dispense Refill   amLODipine (NORVASC) 2.5 MG tablet Take 1 tablet (2.5 mg total) by mouth daily. 30 tablet 3   diclofenac Sodium (VOLTAREN) 1 % GEL Apply 1 application. topically 4 (four) times daily as needed (pain).     ferrous sulfate 325 (65 FE) MG tablet Take 325 mg by mouth daily.     fluticasone (FLONASE) 50 MCG/ACT nasal spray Place 1 spray into both nostrils daily as needed for allergies or rhinitis.     gabapentin (NEURONTIN) 300 MG capsule Take 300 mg by mouth every 8 (eight) hours.     glimepiride (AMARYL) 1 MG tablet Take 1 mg by mouth daily with breakfast.     hydroxychloroquine (PLAQUENIL) 200 MG tablet Take 1 tablet (200 mg total) by mouth daily. 30 tablet 0   ibuprofen (ADVIL) 200 MG tablet Take 200 mg by mouth every 6 (six) hours as needed for moderate pain or headache.     ipratropium-albuterol (DUONEB) 0.5-2.5 (3) MG/3ML  SOLN Take 3 mLs by nebulization every 6 (six) hours as needed. 360 mL 6   levothyroxine (SYNTHROID) 100 MCG tablet Take 100 mcg by mouth daily.     lisinopril (ZESTRIL) 10 MG tablet Take 10 mg by mouth daily.     omeprazole (PRILOSEC) 20 MG capsule Take 20 mg by mouth daily before breakfast.     OVER THE COUNTER MEDICATION Take 1 tablet by mouth daily as needed (allergies). Flonase headache & allergy relief     albuterol (VENTOLIN HFA) 108 (90 Base) MCG/ACT inhaler Inhale 2 puffs into the lungs every 4 (four) hours as needed for wheezing or shortness of breath. (Patient not taking: Reported on 07/31/2021) 18 g 6   No facility-administered medications prior to visit.   Review of Systems  Review of Systems  Constitutional:  Positive for fatigue.  HENT: Negative.    Respiratory:  Negative for cough, chest tightness, shortness of breath and wheezing.        DOE, rare dry cough  Cardiovascular: Negative.     Physical Exam  BP 136/74 (BP Location: Right Arm, Patient Position: Sitting, Cuff Size: Normal)   Pulse 69   Temp 98.3 F (36.8  C) (Oral)   Ht _0  (1.702 m)   Wt 148 lb 12.8 oz (67.5 kg)   SpO2 98%   BMI 23.31 kg/m  Physical Exam Constitutional:      Appearance: Normal appearance.  HENT:     Head: Normocephalic and atraumatic.     Mouth/Throat:     Mouth: Mucous membranes are moist.     Pharynx: Oropharynx is clear.  Cardiovascular:     Rate and Rhythm: Normal rate and regular rhythm.  Pulmonary:     Effort: Pulmonary effort is normal.     Breath sounds: Rales present. No wheezing or rhonchi.  Skin:    General: Skin is warm and dry.  Neurological:     General: No focal deficit present.     Mental Status: She is alert and oriented to person, place, and time. Mental status is at baseline.  Psychiatric:        Mood and Affect: Mood normal.        Behavior: Behavior normal.        Thought Content: Thought content normal.        Judgment: Judgment normal.      Lab  Results:  CBC    Component Value Date/Time   WBC 13.8 (H) 07/02/2021 1624   RBC 4.09 07/02/2021 1624   HGB 12.3 07/02/2021 1624   HGB 11.7 (L) 05/07/2021 1345   HCT 37.1 07/02/2021 1624   PLT 276 07/02/2021 1624   PLT 213 05/07/2021 1345   MCV 90.7 07/02/2021 1624   MCH 30.1 07/02/2021 1624   MCHC 33.2 07/02/2021 1624   RDW 13.3 07/02/2021 1624   LYMPHSABS 1.5 07/02/2021 1624   MONOABS 1.0 07/02/2021 1624   EOSABS 0.1 07/02/2021 1624   BASOSABS 0.0 07/02/2021 1624    BMET    Component Value Date/Time   NA 132 (L) 07/02/2021 1624   K 4.1 07/02/2021 1624   CL 101 07/02/2021 1624   CO2 24 07/02/2021 1624   GLUCOSE 156 (H) 07/02/2021 1624   BUN 18 07/02/2021 1624   CREATININE 1.31 (H) 07/02/2021 1624   CREATININE 1.02 (H) 05/07/2021 1345   CALCIUM 8.9 07/02/2021 1624   GFRNONAA 41 (L) 07/02/2021 1624   GFRNONAA 56 (L) 05/07/2021 1345   GFRAA 55 (L) 02/20/2019 0248    BNP No results found for: "BNP"  ProBNP    Component Value Date/Time   PROBNP 148.0 (H) 07/03/2021 1643    Imaging: DG Chest Portable 1 View  Result Date: 07/02/2021 CLINICAL DATA:  sob EXAM: PORTABLE CHEST 1 VIEW COMPARISON:  June one twenty FINDINGS: Mild cardiomegaly. As before, again seen is peribronchial thickening. There are some interstitial changes seen and are more prominent at the lung bases with likely superimposed mild airspace disease at the right lung base. IMPRESSION: Mild cardiomegaly. Interstitial changes and are more prominent at the lung bases with likely superimposed infiltrations at the right lung base. Electronically Signed   By: Frazier Richards M.D.   On: 07/02/2021 16:14     Assessment & Plan:   ILD (interstitial lung disease) ([place]) -Currently under observation. Symptoms and lung function remain stable. UIP has progressed on CT imaging. No oxygen requirements. We continued the discussion about the role of antifibrotic's, however, she would like to hold off on starting medication  and discuss further with Dr. Chase Caller at her follow-up in October.  Pneumonia - Treated for CAP in June with Augmentin and Azithromycin. Acute respiratory symptoms have clinically resolved. Needs repeat CXR  today. \  Martyn Ehrich, NP 07/31/2021

## 2021-07-31 NOTE — Assessment & Plan Note (Signed)
-  Currently under observation. Symptoms and lung function remain stable. UIP has progressed on CT imaging. No oxygen requirements. We continued the discussion about the role of antifibrotic's, however, she would like to hold off on starting medication and discuss further with Dr. Chase Caller at her follow-up in October.

## 2021-07-31 NOTE — Progress Notes (Signed)
Please let patient know CXR showed chronic pulm fibrosis, decreased opacities in the right which corresponds with resolving pneumonia. No new findings.

## 2021-08-12 ENCOUNTER — Telehealth: Payer: Self-pay | Admitting: Student

## 2021-08-12 ENCOUNTER — Telehealth: Payer: Self-pay

## 2021-08-12 NOTE — Telephone Encounter (Signed)
[name] w/[facility] calling to confirm strength of medication. Please call her back at [phone].

## 2021-08-13 ENCOUNTER — Ambulatory Visit: Payer: Medicare Other | Admitting: Student

## 2021-08-13 NOTE — Telephone Encounter (Signed)
That is a typo in the note. Amlodipine appears to have been sent. Can you please clarify

## 2021-08-14 NOTE — Telephone Encounter (Signed)
Called and spoke to patient she said she does not have that medication she is out of town but is coming back today she will call her daughter to make sure they did not deliver it after she left. But, no patient is not taking medication

## 2021-08-14 NOTE — Telephone Encounter (Signed)
If patient has not received it, please send amlodipine 2.5 mg daily to her pharmacy.

## 2021-08-18 ENCOUNTER — Telehealth: Payer: Self-pay | Admitting: Student

## 2021-08-18 ENCOUNTER — Ambulatory Visit: Payer: Medicare Other | Admitting: Student

## 2021-08-18 NOTE — Progress Notes (Deleted)
Primary Physician/Referring:  Carol Ada, MD  Patient ID: [name], female    DOB: 28-Aug-1940, 81 y.o.   MRN: [id number]  No chief complaint on file.  HPI:    [name]  is a 81 y.o. 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  She was treated with Xarelto for repeated of 6 months.  Patient was last seen in the office 07/14/2021 for an urgent visit as she was evaluated in the ED for pneumonia and EMS EKG transiently noted concern for atrial flutter.  At last office visit ordered 2-week cardiac monitor to further evaluate underlying atrial fibrillation/flutter as well as added amlodipine 2.5 mg p.o. daily given uncontrolled hypertension.  2-week cardiac monitor revealed no evidence of atrial fibrillation or flutter.  Patient now presents for 4-week follow-up. ***  ***Did not start amlodipine?   Patient was last seen in the office 04/17/2021 by Dr. Einar Gip at which time she was advised to follow-up as needed.  Since then patient has follow-up with pulmonology for management of ILD and was recently diagnosed with community-acquired pneumonia 07/02/2021.  She now presents with complaints of shortness of breath.  During evaluation in the ED provider noted EMS EKG to be concerning for atrial flutter and advised patient follow-up with cardiology.  Patient states she continues to have shortness of breath and occasional palpitations but this has been improving with treatment of pneumonia.  She continues to follow closely with pulmonology.  Denies chest pain, syncope, near syncope.  Past  Medical History:  Diagnosis Date   Breast cancer ([place])    Diabetes mellitus without complication ([place])    Hypertension    Hypothyroidism    Lupus ([place])    Past Surgical History:  Procedure Laterality Date   BREAST LUMPECTOMY  2009   IR 3D INDEPENDENT WKST  05/29/2021   IR ANGIO INTRA EXTRACRAN SEL COM CAROTID INNOMINATE BILAT MOD SED  05/29/2021   IR ANGIO VERTEBRAL SEL VERTEBRAL BILAT MOD SED  05/29/2021   Family History  Problem Relation Age of Onset   Cancer Father        ? type   Asthma Brother     Social History   Tobacco Use   Smoking status: Never   Smokeless tobacco: Former    Types: Snuff    Quit date: 02/03/2015  Substance Use Topics   Alcohol use: No   Marital Status: Single  ROS  Review of Systems  Cardiovascular:  Positive for dyspnea on exertion and palpitations. Negative for chest pain and leg swelling.  Respiratory:  Positive for wheezing.   Musculoskeletal:  Positive for arthritis and back pain.  Gastrointestinal:  Negative for melena.   Objective  There were no vitals taken for this visit.     07/31/2021   11:47 AM 07/14/2021    8:47 AM 07/14/2021    8:36 AM  Vitals with BMI  Height 5' 7"  5' 7"  Weight 148 lbs 13 oz  150 lbs 6 oz  BMI 25.3  66.44  Systolic 034 742 595  Diastolic 74 91 76  Pulse 69 63 78     Physical Exam Vitals  reviewed.  Neck:     Vascular: No carotid bruit or JVD.  Cardiovascular:     Rate and Rhythm: Normal rate and regular rhythm.     Pulses: Intact distal pulses.     Heart sounds: Normal heart sounds. No murmur heard.    No gallop.  Pulmonary:     Effort: Pulmonary effort is normal.     Breath sounds: Rales (coarse bilateral basal leathery crackles) present.  Musculoskeletal:     Cervical back: Neck supple.     Right lower leg: No edema.     Left lower leg: No edema.    Laboratory examination:   Recent Labs    05/07/21 1345 05/29/21 0829 07/02/21 1624  NA 138 135 132*  K 3.6 3.8 4.1  CL 103 101 101  CO2 _0 GLUCOSE 115* 123* 156*  BUN _1 CREATININE 1.02* 1.10* 1.31*  CALCIUM 9.4 9.5 8.9  GFRNONAA 56* 51* 41*    CrCl cannot be calculated (Patient's most recent lab result is older than the maximum 21 days allowed.).     Latest Ref Rng & Units 07/02/2021    4:24 PM 05/29/2021    8:29 AM 05/07/2021    1:45 PM  CMP  Glucose 70 - 99 mg/dL 156  123  115   BUN 8 - 23 mg/dL _2 Creatinine 0.44 - 1.00 mg/dL 1.31  1.10  1.02   Sodium 135 - 145 mmol/L 132  135  138   Potassium 3.5 - 5.1 mmol/L 4.1  3.8  3.6   Chloride 98 - 111 mmol/L 101  101  103   CO2 22 - 32 mmol/L _3 Calcium 8.9 - 10.3 mg/dL 8.9  9.5  9.4   Total Protein 6.5 - 8.1 g/dL 7.8   9.1   Total Bilirubin 0.3 - 1.2 mg/dL 1.0   0.3   Alkaline Phos 38 - 126 U/L 88   63   AST 15 - 41 U/L 33   23   ALT 0 - 44 U/L 24   17       Latest Ref Rng & Units 07/02/2021    4:24 PM 05/29/2021    8:29 AM 05/07/2021    1:45 PM  CBC  WBC 4.0 - 10.5 K/uL 13.8  5.5  5.2   Hemoglobin 12.0 - 15.0 g/dL 12.3  12.3  11.7   Hematocrit 36.0 - 46.0 % 37.1  37.9  36.4   Platelets 150 - 400 K/uL 276  233  213    TSH No results for input(s): "TSH" in the last 8760 hours.  External labs:  Labs 06/25/2020:  A1c 7.5%.  Hb 11.3/HCT 33.7, platelets 234.  Normal indicis.  Serum glucose 122, BUN 13, creatinine 1.20, EGFR 46 mL, potassium 4.3, CMP otherwise normal.  Total cholesterol 104, triglycerides 113, HDL 37, LDL 67.  Non-HDL cholesterol 113.  Medications and allergies   Allergies  Allergen Reactions   Ciprofloxacin Itching, Swelling and Other (See Comments)    Possibly causing tremors?   Crestor [Rosuvastatin] Other (See Comments)    Myalgia and back pain     Current Outpatient Medications:    albuterol (VENTOLIN HFA) 108 (90 Base) MCG/ACT inhaler, Inhale 2 puffs into the lungs every 4 (four) hours as needed for wheezing or shortness of breath. (Patient not taking: Reported on 07/31/2021), Disp: 18 g, Rfl: 6    amLODipine (  NORVASC) 2.5 MG tablet, Take 1 tablet (2.5 mg total) by mouth daily., Disp: 30 tablet, Rfl: 3   diclofenac Sodium (VOLTAREN) 1 % GEL, Apply 1 application. topically 4 (four) times daily as needed (pain)., Disp: , Rfl:    ferrous sulfate 325 (65 FE) MG tablet, Take 325 mg by mouth daily., Disp: , Rfl:    fluticasone (FLONASE) 50 MCG/ACT nasal spray, Place 1 spray into both nostrils daily as needed for allergies or rhinitis., Disp: , Rfl:    gabapentin (NEURONTIN) 300 MG capsule, Take 300 mg by mouth every 8 (eight) hours., Disp: , Rfl:    glimepiride (AMARYL) 1 MG tablet, Take 1 mg by mouth daily with breakfast., Disp: , Rfl:    hydroxychloroquine (PLAQUENIL) 200 MG tablet, Take 1 tablet (200 mg total) by mouth daily., Disp: 30 tablet, Rfl: 0   ibuprofen (ADVIL) 200 MG tablet, Take 200 mg by mouth every 6 (six) hours as needed for moderate pain or headache., Disp: , Rfl:    ipratropium-albuterol (DUONEB) 0.5-2.5 (3) MG/3ML SOLN, Take 3 mLs by nebulization every 6 (six) hours as needed., Disp: 360 mL, Rfl: 6   levothyroxine (SYNTHROID) 100 MCG tablet, Take 100 mcg by mouth daily., Disp: , Rfl:    lisinopril (ZESTRIL) 10 MG tablet, Take 10 mg by mouth daily., Disp: , Rfl:    omeprazole (PRILOSEC) 20 MG capsule, Take 20 mg by mouth daily before breakfast., Disp: , Rfl:    OVER THE COUNTER MEDICATION, Take 1 tablet by mouth daily as needed (allergies). Flonase headache & allergy relief, Disp: , Rfl:   Radiology:   CT head without contrast and CT cervical spine without contrast 02/18/2019: 1. No acute intracranial pathology. 2. No acute/traumatic cervical spine pathology. Multilevel degenerative changes most prominent at C4-C5.  HRCT chest 07/29/2019: 1. The appearance of the lungs is compatible with interstitial lung disease, with a spectrum of findings considered diagnostic of usual interstitial pneumonia (UIP) per current ATS guidelines. No significant progression of disease compared  to the prior  examination. 2. Aortic atherosclerosis, in addition to left main and 3 vessel coronary artery disease. Assessment for potential risk factor modification, dietary therapy or pharmacologic therapy may be warranted, if clinically indicated. 3. Aortic Atherosclerosis (ICD10-I70.0).  Cardiac Studies:   Lexiscan myoview stress test 07/03/2016: 1. The resting electrocardiogram demonstrated normal sinus rhythm, normal resting conduction, PVC and normal rest repolarization. Stress EKG is non-diagnostic for ischemia as it a pharmacologic stress using Lexiscan. Stress symptoms included dyspnea. 2. Myocardial perfusion imaging is normal. Overall left ventricular systolic function was normal without regional wall motion abnormalities. The left ventricular ejection fraction was 75%.   Carotid artery duplex  02/18/2019: Right Carotid: Velocities in the right ICA are consistent with a 1-39% stenosis. Left Carotid: Velocities in the left ICA are consistent with a 1-39% stenosis. Vertebrals:  Bilateral vertebral arteries demonstrate antegrade flow. Subclavians: Normal flow hemodynamics were seen in bilateral subclavian arteries.  PCV ECHOCARDIOGRAM COMPLETE 24/82/5003 Normal LV systolic function with EF 57%. Left ventricle cavity is normal in size. Moderate concentric hypertrophy of the left ventricle. Normal global wall motion. Doppler evidence of grade I (impaired) diastolic dysfunction, normal LAP. Calculated EF 57%. Left atrial cavity is mildly dilated. Mild calcification of the mitral valve annulus. Mild mitral valve leaflet thickening with mild calcification especially of the anterior MV leaflet. Trace mitral regurgitation. No evidence of mitral valve stenosis. Structurally normal tricuspid valve.  Mild tricuspid regurgitation. No evidence of pulmonary hypertension. Structurally normal pulmonic valve.  Mild pulmonic regurgitation. No significant change from 10/10/2020.  Zio patch cardiac  monitor 11 days (07/14/2021 - 07/25/2021): Predominant underlying rhythm was sinus.  8 episodes of SVT with the longest lasting 19 beats.  Single nonsustained asymptomatic 4 beat episode of ventricular tachycardia at 7 AM on 07/20/2021.  Rare PACs and rare PVCs.  Minimum heart rate 48 bpm, maximum heart rate 220 bpm, average heart rate 73 bpm.  No evidence of high degree AV block, pauses >3 seconds, or atrial fibrillation.  No patient triggered events.  EKG  EKG 04/17/2021: Normal sinus rhythm at the rate of 71 bpm, left atrial enlargement, left axis deviation, left anterior fascicular block.  Borderline progression, cannot exclude anteroseptal infarct old.  Nonspecific T abnormality in 1 and aVL. No significant change from EKG 12/05/2020   Assessment   No diagnosis found.   There are no discontinued medications.   No orders of the defined types were placed in this encounter.  Recommendations:   Jamariah Tony is a 81 y.o. 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  She was treated with Xarelto for repeated of 6 months.  Patient was last seen in the office 07/14/2021 for an urgent visit as she was evaluated in the ED for pneumonia and EMS EKG transiently noted concern for atrial flutter.  At last office visit ordered 2-week cardiac monitor to further evaluate underlying atrial fibrillation/flutter as well as added amlodipine 2.5 mg p.o. daily given uncontrolled hypertension.  2-week cardiac monitor revealed no evidence of atrial fibrillation or flutter.  Patient now presents for 4-week  follow-up. ***  ***Did not start amlodipine?   Patient was last seen in the office 04/17/2021 by Dr. Einar Gip at which time she was advised to follow-up as needed.  Since then patient has follow-up with pulmonology for management of ILD and was recently diagnosed with community-acquired pneumonia 07/02/2021.  She now presents with complaints of shortness of breath.  During evaluation in the ED provider noted EMS EKG to be concerning for atrial flutter and advised patient follow-up with cardiology.  SPECT patient's symptoms are primarily related to underlying lung pathology.  However given concern for atrial flutter by EMS EKG according to ED records, will obtain 2-week cardiac monitor to evaluate for underlying arrhythmias.    Patient's blood pressure is also uncontrolled, will start Motifene 2.5 mg p.o. daily.  Advised patient to monitor blood pressure daily at home and bring a written log to her next appointment.  Follow-up in 4 weeks, sooner if needed.   Alethia Berthold, PA-C 08/18/2021, 10:45 AM Office: [phone]

## 2021-08-18 NOTE — Telephone Encounter (Signed)
[name] w/[facility] says she spoke w/someone on 7/18; looks like it was [place]. She says no pharmacy has received any refills on her medication so she is wondering if [facility] [place] wants her to stop taking that particular medicine. I informed her patient is coming in for office visit today, but I would still reach out.

## 2021-09-04 ENCOUNTER — Ambulatory Visit
Admission: RE | Admit: 2021-09-04 | Discharge: 2021-09-04 | Disposition: A | Discharge status: Home / Self Care | Payer: Medicare Other | Source: Ambulatory Visit | Attending: Family Medicine | Admitting: Family Medicine

## 2021-09-04 DIAGNOSIS — E2839 Other primary ovarian failure: Secondary | ICD-10-CM

## 2021-09-04 DIAGNOSIS — Z1231 Encounter for screening mammogram for malignant neoplasm of breast: Secondary | ICD-10-CM

## 2021-09-11 ENCOUNTER — Encounter: Payer: Self-pay | Admitting: Cardiology

## 2021-09-17 ENCOUNTER — Telehealth: Payer: Self-pay | Admitting: Internal Medicine

## 2021-09-17 NOTE — Telephone Encounter (Signed)
[name],  Patient is wanting to move forward with starting the antifibrotics that you and her discussed in office last visit.   Please advise [name].  Which medication are you wanting to start and ill get the paperwork started if it requires paperwork.   Thank you

## 2021-09-26 ENCOUNTER — Ambulatory Visit: Payer: Medicare Other | Admitting: Cardiology

## 2021-09-26 NOTE — Telephone Encounter (Signed)
Ofev is fine. Coronary art calcification in itself is not an issue. There was 0.5% increased MI in the Rx arm in INPULSIS. So we just hav eit as a caution in patients with known CAD but is not a contraindication. If ofev wont work for her, esbriet  I do not see followup /.Last PFt May 2023. So copying front desk to get spiro/dlco and first avail 30 min visit with me      Latest Ref Rng & Units 06/03/2021   10:37 AM 02/14/2021   12:36 PM 05/18/2017   12:44 PM  PFT Results  FVC-Pre L 1.90  1.84  1.61   FVC-Predicted Pre % 80  76  63   Pre FEV1/FVC % % 82  84  93   FEV1-Pre L 1.56  1.54  1.51   FEV1-Predicted Pre % 84  82  76   DLCO uncorrected ml/min/mmHg 9.75  9.78    DLCO UNC% % 47  47    DLCO corrected ml/min/mmHg 10.11  9.78    DLCO COR %Predicted % 48  47    DLVA Predicted % 76  70

## 2021-09-26 NOTE — Telephone Encounter (Signed)
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 She has an apt with you in October I believe. She has three vessel coronary artery calcifications on imaging, no other significant cardiac history. She is not on blood thinner. Do you recommend Ofev or Esbriet

## 2021-09-30 ENCOUNTER — Telehealth: Payer: Self-pay | Admitting: Pharmacist

## 2021-09-30 DIAGNOSIS — J849 Interstitial pulmonary disease, unspecified: Secondary | ICD-10-CM

## 2021-09-30 DIAGNOSIS — Z5181 Encounter for therapeutic drug level monitoring: Secondary | ICD-10-CM

## 2021-09-30 NOTE — Telephone Encounter (Signed)
Received notification that patient is now open to starting antifibrotics for ILD.  Submitted a Prior Authorization request to [facility] for OFEV via CoverMyMeds. Will update once we receive a response.  Key: [id number]  Dose: '150mg'$  twice daily  She needs updated labwork (CBC, CMET) prior to starting Ofev.  Knox Saliva, PharmD, MPH, BCPS, CPP Clinical Pharmacist (Rheumatology and Pulmonology)

## 2021-09-30 NOTE — Telephone Encounter (Signed)
Will start Ofev benefits investigation in separate telephone encounter. Of note, patient will need updated CBC and CMET prior to starting Ofev  Knox Saliva, PharmD, MPH, BCPS, CPP Clinical Pharmacist (Rheumatology and Pulmonology)

## 2021-09-30 NOTE — Telephone Encounter (Signed)
[name] can you let patient know we will start process with Pharmacy about getting her approved for medication called OFEV to help slow down progression of her pulmonary fibrosis. There can be several side effects including nausea, vomiting, diarrhea and weight loss. We will monitor routine labs and our pharmacy will reach out to her gain to go over some of the safe precautions and side effect panel    Front desk- We need to schedule spiro/dlco prior to apt with MR FIRST AVAILABLE (30 min)

## 2021-09-30 NOTE — Telephone Encounter (Signed)
Attempted to call pt but after the line ringing twice, it went to a busy tone. Tried to call back, but same thing happened. Will try to call back later.  Have sent pt a mychart message with the info from [facility] letting her also know that we need to schedule a follow up with her to see MR and to have a 53mn PFT prior. The office visit with MR will need to be 385m as pt has ILD.   Routing to front desk pool for help.

## 2021-10-01 NOTE — Telephone Encounter (Signed)
Patient states she is returning a call that she missed.  She would like the nurse to call her to explain about her medication.  Please advise and call patient back at [phone]

## 2021-10-01 NOTE — Telephone Encounter (Signed)
Received a fax regarding Prior Authorization from [facility] for [name]. Authorization has been DENIED because patient does not have HRCT showing at least 10% of lung volume with fibrotic changes.  Will need to appeal  Knox Saliva, PharmD, MPH, BCPS, CPP Clinical Pharmacist (Rheumatology and Pulmonology)

## 2021-10-03 NOTE — Telephone Encounter (Signed)
Pt has been contacted to schedule f/u through calls and MyChart. Nothing further needed.

## 2021-10-15 NOTE — Telephone Encounter (Signed)
Submitted an URGENT appeal to [facility] for [place].  Reference # [id number] Phone: [phone] Fax: [phone]  Knox Saliva, PharmD, MPH, BCPS, CPP Clinical Pharmacist (Rheumatology and Pulmonology)

## 2021-10-23 ENCOUNTER — Other Ambulatory Visit (HOSPITAL_COMMUNITY): Payer: Self-pay

## 2021-10-23 NOTE — Telephone Encounter (Signed)
Received fax from [facility]. Ofev denial has been OVERTURNED. Dickey Gave is approved from 09/30/21 through 01/25/22  Auth # [id number] Case # [id number]  Per test claim, patient has no copay for Ofev.  She will need updated baseline CBC/CMP prior to starting. She will plan to arrange transportation for labwork next week.  I have advised her to schedule same-day spirometry and appt with Dr. Chase Caller while onsite for [facility], PharmD, MPH, BCPS, CPP Clinical Pharmacist (Rheumatology and Pulmonology)

## 2021-10-24 ENCOUNTER — Other Ambulatory Visit (INDEPENDENT_AMBULATORY_CARE_PROVIDER_SITE_OTHER): Payer: Medicare Other

## 2021-10-24 DIAGNOSIS — Z5181 Encounter for therapeutic drug level monitoring: Secondary | ICD-10-CM | POA: Diagnosis not present

## 2021-10-24 DIAGNOSIS — J849 Interstitial pulmonary disease, unspecified: Secondary | ICD-10-CM | POA: Diagnosis not present

## 2021-10-24 LAB — COMPREHENSIVE METABOLIC PANEL
ALT: 13 U/L (ref 0–35)
AST: 21 U/L (ref 0–37)
Albumin: 3.9 g/dL (ref 3.5–5.2)
Alkaline Phosphatase: 71 U/L (ref 39–117)
BUN: 11 mg/dL (ref 6–23)
CO2: 26 mEq/L (ref 19–32)
Calcium: 9.5 mg/dL (ref 8.4–10.5)
Chloride: 105 mEq/L (ref 96–112)
Creatinine, Ser: 1.04 mg/dL (ref 0.40–1.20)
GFR: 51.13 mL/min — ABNORMAL LOW (ref >60.00)
Glucose, Bld: 73 mg/dL (ref 70–99)
Potassium: 3.9 mEq/L (ref 3.5–5.1)
Sodium: 137 mEq/L (ref 135–145)
Total Bilirubin: 0.3 mg/dL (ref 0.2–1.2)
Total Protein: 8.3 g/dL (ref 6.0–8.3)

## 2021-10-24 LAB — CBC WITH DIFFERENTIAL/PLATELET
Basophils Absolute: 0 10*3/uL (ref 0.0–0.1)
Basophils Relative: 0.4 % (ref 0.0–3.0)
Eosinophils Absolute: 0.2 10*3/uL (ref 0.0–0.7)
Eosinophils Relative: 3.1 % (ref 0.0–5.0)
HCT: 33.8 % — ABNORMAL LOW (ref 36.0–46.0)
Hemoglobin: 11.3 g/dL — ABNORMAL LOW (ref 12.0–15.0)
Lymphocytes Relative: 42.1 % (ref 12.0–46.0)
Lymphs Abs: 2.2 10*3/uL (ref 0.7–4.0)
MCHC: 33.4 g/dL (ref 30.0–36.0)
MCV: 90.2 fl (ref 78.0–100.0)
Monocytes Absolute: 0.4 10*3/uL (ref 0.1–1.0)
Monocytes Relative: 8.3 % (ref 3.0–12.0)
Neutro Abs: 2.4 10*3/uL (ref 1.4–7.7)
Neutrophils Relative %: 46.1 % (ref 43.0–77.0)
Platelets: 214 10*3/uL (ref 150.0–400.0)
RBC: 3.75 Mil/uL — ABNORMAL LOW (ref 3.87–5.11)
RDW: 14.8 % (ref 11.5–15.5)
WBC: 5.2 10*3/uL (ref 4.0–10.5)

## 2021-10-28 ENCOUNTER — Telehealth: Payer: Self-pay | Admitting: Pharmacist

## 2021-10-28 DIAGNOSIS — J849 Interstitial pulmonary disease, unspecified: Secondary | ICD-10-CM

## 2021-10-28 DIAGNOSIS — Z5181 Encounter for therapeutic drug level monitoring: Secondary | ICD-10-CM

## 2021-10-28 MED ORDER — OFEV 150 MG PO CAPS: 150.0000 mg | ORAL_CAPSULE | Freq: Two times a day (BID) | ORAL | 5 refills | Status: DC

## 2021-10-28 NOTE — Telephone Encounter (Signed)
Subjective:  Patient called today by [facility] pharmacy team for Ofev new start counseling.   Patient was last seen by Derl Barrow, NP on 07/31/21. Primary pulmonologist is Dr. Chase Caller. 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  She was previously reluctant to start antifibrotic but states that she decided to move forward with it as a trial.  History of CAD: No History of MI: No Current anticoagulant use: No History of HTN: Yes  History of elevated LFTs: No History of diarrhea, nausea, vomiting: No  Objective: Allergies  Allergen Reactions   Ciprofloxacin Itching, Swelling and Other (See Comments)    Possibly causing tremors?   Crestor [Rosuvastatin] Other (See Comments)    Myalgia and back pain    Outpatient Encounter Medications as of 10/28/2021  Medication Sig Note   albuterol (VENTOLIN HFA) 108 (90 Base) MCG/ACT inhaler Inhale 2 puffs into the lungs every 4 (four) hours as needed for wheezing or shortness of breath. (Patient not taking: Reported on 07/31/2021)    amLODipine (NORVASC) 2.5 MG tablet Take 1 tablet (2.5 mg total) by mouth daily.    diclofenac Sodium (VOLTAREN) 1 % GEL Apply 1 application. topically 4 (four) times daily as needed (pain).    ferrous sulfate 325 (65 FE) MG tablet Take 325 mg by mouth daily.    fluticasone (FLONASE) 50 MCG/ACT nasal spray Place 1 spray into both nostrils daily as needed for allergies or rhinitis.    gabapentin (NEURONTIN) 300 MG capsule Take 300 mg by mouth every 8 (eight) hours.    glimepiride (AMARYL) 1 MG tablet Take 1 mg by mouth daily with breakfast.    hydroxychloroquine (PLAQUENIL) 200 MG tablet Take 1 tablet (200 mg total) by mouth daily.    ibuprofen (ADVIL) 200 MG tablet Take 200 mg by mouth every 6 (six) hours as needed for moderate pain or headache.    ipratropium-albuterol (DUONEB) 0.5-2.5 (3) MG/3ML SOLN Take 3 mLs by nebulization every 6 (six) hours as needed.    levothyroxine  (SYNTHROID) 100 MCG tablet Take 100 mcg by mouth daily.    lisinopril (ZESTRIL) 10 MG tablet Take 10 mg by mouth daily.    omeprazole (PRILOSEC) 20 MG capsule Take 20 mg by mouth daily before breakfast.    OVER THE COUNTER MEDICATION Take 1 tablet by mouth daily as needed (allergies). Flonase headache & allergy relief    [DISCONTINUED] Tiotropium Bromide Monohydrate (SPIRIVA RESPIMAT) 1.25 MCG/ACT AERS Inhale 2 puffs into the lungs daily. (Patient not taking: Reported on 09/15/2019) 11/29/2019: Pulmonologist has stopped this medication per patient   No facility-administered encounter medications on file as of 10/28/2021.     Immunization History  Administered Date(s) Administered   Influenza, High Dose Seasonal PF 11/09/2016, 10/27/2018, 10/26/2020   Influenza,inj,Quad PF,6+ Mos 10/27/2015   PFIZER(Purple Top)SARS-COV-2 Vaccination 04/22/2019, 05/13/2019, 01/15/2020   Pneumococcal Conjugate-13 07/11/2013, 02/15/2018   Pneumococcal Polysaccharide-23 03/18/2020   Tdap 03/20/2021      PFT's No results found for: "FEV1", "FVC", "FEV1FVC", "TLC", "DLCO"    CMP     Component Value Date/Time   NA 137 10/24/2021 1517   K 3.9 10/24/2021 1517   CL 105 10/24/2021 1517   CO2 26 10/24/2021 1517   GLUCOSE 73 10/24/2021 1517   BUN 11 10/24/2021 1517   CREATININE 1.04 10/24/2021 1517   CREATININE 1.02 (H) 05/07/2021 1345   CALCIUM 9.5 10/24/2021 1517   PROT 8.3 10/24/2021 1517   ALBUMIN 3.9 10/24/2021 1517   AST  21 10/24/2021 1517   AST 23 05/07/2021 1345   ALT 13 10/24/2021 1517   ALT 17 05/07/2021 1345   ALKPHOS 71 10/24/2021 1517   BILITOT 0.3 10/24/2021 1517   BILITOT 0.3 05/07/2021 1345   GFRNONAA 41 (L) 07/02/2021 1624   GFRNONAA 56 (L) 05/07/2021 1345   GFRAA 55 (L) 02/20/2019 0248      CBC    Component Value Date/Time   WBC 5.2 10/24/2021 1517   RBC 3.75 (L) 10/24/2021 1517   HGB 11.3 (L) 10/24/2021 1517   HGB 11.7 (L) 05/07/2021 1345   HCT 33.8 (L) 10/24/2021 1517    PLT 214.0 10/24/2021 1517   PLT 213 05/07/2021 1345   MCV 90.2 10/24/2021 1517   MCH 30.1 07/02/2021 1624   MCHC 33.4 10/24/2021 1517   RDW 14.8 10/24/2021 1517   LYMPHSABS 2.2 10/24/2021 1517   MONOABS 0.4 10/24/2021 1517   EOSABS 0.2 10/24/2021 1517   BASOSABS 0.0 10/24/2021 1517      LFT's    Latest Ref Rng & Units 10/24/2021    3:17 PM 07/02/2021    4:24 PM 05/07/2021    1:45 PM  Hepatic Function  Total Protein 6.0 - 8.3 g/dL 8.3  7.8  9.1   Albumin 3.5 - 5.2 g/dL 3.9  2.9  4.1   AST 0 - 37 U/L 21  33  23   ALT 0 - 35 U/L 13  24  17    Alk Phosphatase 39 - 117 U/L 71  88  63   Total Bilirubin 0.2 - 1.2 mg/dL 0.3  1.0  0.3     HRCT (11/08/20) - Redemonstrated moderate pulmonary fibrosis in a pattern with apical to basal gradient, featuring irregular peripheral interstitial opacity, septal thickening, traction bronchiectasis, subpleural bronchiolectasis, and areas of honeycombing at the lung bases. Fibrotic findings are slightly worsened compared to prior examination dated 07/27/2019 and clearly worsened over time on examinations dating back to 07/22/2016. Findings are consistent with UIP per consensus guidelines  Chest CT (07/31/21) - Chronic pulmonary fibrosis with decreased opacities in the right lower lung. No new airspace disease.  Assessment and Plan  Ofev Medication Management Thoroughly counseled patient on the efficacy, mechanism of action, dosing, administration, adverse effects, and monitoring parameters of Ofev. Patient verbalized understanding.  Goals of Therapy: Will not stop or reverse the progression of ILD. It will slow the progression of ILD.  Inhibits tyrosine kinase inhibitors which slow the fibrosis/progression of ILD -Significant reduction in the rate of disease progression was observed after treatment (61.1% [before] vs 33.3% [after], P?=?0.008) over 42 weeks.  Dosing: 150 mg (one capsule) by mouth twice daily (approx 12 hours apart). Discussed taking with  food approximately 12 hours apart. Discussed that capsule should not be crushed or split.  Adverse Effects: Nausea, vomiting, diarrhea (2 in 3 patients), appetite loss, weight loss - management of diarrhea with loperamide discussed including max use of 48 hours and max of 8 capsules per day. Abdominal pain (up to 1 in 5 patients) Nasopharyngitis (13%), UTI (6%) Risk of thrombosis (3%) and acute MI (2%) Hypertension (5%) Dizziness Fatigue (10%)  Monitoring: Monitor for diarrhea, nausea and vomiting, GI perforation, hepatotoxicity  Monitor LFTs - baseline, monthly for first 6 months, then every 3 months routinely CBC w differential at baseline and every 3 months routinely Future order for CMET placed today. Reviewed with patient in detail since she must schedule transportation  Access: Approval of Ofev through: insurance. No copay Rx sent to:  [facility]: [phone]   Medication Reconciliation A drug regimen assessment was performed, including review of allergies, interactions, disease-state management, dosing and immunization history. Medications were reviewed with the patient, including name, instructions, indication, goals of therapy, potential side effects, importance of adherence, and safe use.  Anticoagulant use: No  This appointment required 15 minutes of patient care (this includes precharting, chart review, review of results, face-to-face care, etc.).  Thank you for involving pharmacy to assist in providing this patient's care.   Knox Saliva, PharmD, MPH, BCPS, CPP Clinical Pharmacist (Rheumatology and Pulmonology)

## 2021-11-05 NOTE — Telephone Encounter (Signed)
Patient called back- gave [facility] phone number, [phone]. Patient states she will give them a call.

## 2021-11-05 NOTE — Telephone Encounter (Signed)
[facility] to determine if patient has filled Ofev. Per pharmacy rep, shipment has not been scheduled. Attempted to conference call w patient to schedule but unable to reach. Left VM requesting she call [facility] to schedule shipment  Knox Saliva, PharmD, MPH, BCPS, CPP Clinical Pharmacist (Rheumatology and Pulmonology)

## 2021-11-14 NOTE — Telephone Encounter (Signed)
Called pt regarding Ofev shipment. Patient started Ofev 6 days ago. States she is tolerating without issue thus far. Advised her to call [facility] when she 1-2 weeks of medication remaining since they do not do auto-refills on specialty medications. She verbalized understanding.  Knox Saliva, PharmD, MPH, BCPS, CPP Clinical Pharmacist (Rheumatology and Pulmonology)

## 2021-11-20 ENCOUNTER — Telehealth: Payer: Self-pay | Admitting: Internal Medicine

## 2021-11-20 NOTE — Telephone Encounter (Signed)
Called and spoke with patient. She verbalized understanding and is aware to call us back if anything changes.   Nothing further needed at time of call.

## 2021-11-20 NOTE — Telephone Encounter (Signed)
Hold Ofev for the next week.  Would try to restart once daily with meal for 2 weeks then try to go up to twice daily to see if can tolerate better.  If unable to tolerate please call office back we will give further instructions.  If needs to come in we can work in for a visit  Please contact office for sooner follow up if symptoms do not improve or worsen or seek emergency care

## 2021-11-20 NOTE — Telephone Encounter (Signed)
Spoke with the pt  She started OFEV approx 2 wks ago  She has started to feel bad over the past 2 days  She says it's hard to describe, but she feels "sluggish"  She denies any abdominal pain, nausea, vomiting, diarrhea, constipation  She states her stomach "feels bad, like when you drink too much water"  She is taking OFEV with meals BID  Tammy, please advise, thanks!

## 2021-12-06 ENCOUNTER — Encounter: Payer: Self-pay | Admitting: Emergency Medicine

## 2021-12-06 ENCOUNTER — Ambulatory Visit
Admission: EM | Admit: 2021-12-06 | Discharge: 2021-12-06 | Disposition: A | Discharge status: Home / Self Care | Payer: Medicare Other | Attending: Physician Assistant | Admitting: Physician Assistant

## 2021-12-06 DIAGNOSIS — Z1152 Encounter for screening for COVID-19: Secondary | ICD-10-CM | POA: Diagnosis not present

## 2021-12-06 DIAGNOSIS — J029 Acute pharyngitis, unspecified: Secondary | ICD-10-CM | POA: Insufficient documentation

## 2021-12-06 LAB — RESP PANEL BY RT-PCR (FLU A&B, COVID) ARPGX2
Influenza A by PCR: NEGATIVE
Influenza B by PCR: NEGATIVE
SARS Coronavirus 2 by RT PCR: NEGATIVE

## 2021-12-06 NOTE — Discharge Instructions (Addendum)
Tylenol every 4 hours. Warm salt water gargles.

## 2021-12-06 NOTE — ED Triage Notes (Signed)
Pt said she has been having sore throat since Thursday with a cough, no fevers, no chills

## 2021-12-12 NOTE — ED Provider Notes (Signed)
[facility]    CSN: [id number] Arrival date & time: 12/06/21  1101      History   Chief Complaint Chief Complaint  Patient presents with   Sore Throat    HPI [name] is a 81 y.o. female.   Patient complains of a sore throat and a cough for 3 days.  Patient reports she has not had a fever or chills.  Patient denies any exposure to anyone with flu or COVID.  The history is provided by the patient. No language interpreter was used.  Sore Throat This is a new problem. The problem occurs constantly. The problem has not changed since onset.Nothing aggravates the symptoms. Nothing relieves the symptoms. She has tried nothing for the symptoms.    Past Medical History:  Diagnosis Date   Breast cancer ([place])    Diabetes mellitus without complication ([place])    Hypertension    Hypothyroidism    Lupus ([place])     Patient Active Problem List   Diagnosis Date Noted   Pneumonia 07/31/2021   Acute cystitis 02/20/2019   Near syncope 02/18/2019   Hyponatremia 02/18/2019   ILD (interstitial lung disease) ([place]) 07/30/2016   Dyspnea 07/16/2016   Bibasilar crackles 07/16/2016   History of systemic lupus erythematosus (SLE) ([place]) 07/16/2016   Cough 07/16/2016   Encounter for monitoring ACE-inhibitor therapy 07/16/2016   History of asthma 07/16/2016   Fever, unspecified    Hypothyroidism 01/03/2015   HTN (hypertension) 01/03/2015   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      Home Medications    Prior to Admission medications   Medication Sig Start Date End Date Taking? Authorizing Provider  albuterol  (VENTOLIN HFA) 108 (90 Base) MCG/ACT inhaler Inhale 2 puffs into the lungs every 4 (four) hours as needed for wheezing or shortness of breath. Patient not taking: Reported on 07/31/2021 07/03/21   Maryjane Hurter, MD  amLODipine (NORVASC) 2.5 MG tablet Take 1 tablet (2.5 mg total) by mouth daily. 07/14/21 11/11/21  Cantwell, Celeste C, PA-C  diclofenac Sodium (VOLTAREN) 1 % GEL Apply 1 application. topically 4 (four) times daily as needed (pain).    [provider]  ferrous sulfate 325 (65 FE) MG tablet Take 325 mg by mouth daily.    [provider]  fluticasone (FLONASE) 50 MCG/ACT nasal spray Place 1 spray into both nostrils daily as needed for allergies or rhinitis.    [provider]  gabapentin (NEURONTIN) 300 MG capsule Take 300 mg by mouth every 8 (eight) hours.    [provider]  glimepiride (AMARYL) 1 MG tablet Take 1 mg by mouth daily with breakfast.    [provider]  hydroxychloroquine (PLAQUENIL) 200 MG tablet Take 1 tablet (200 mg total) by mouth daily. 01/06/15   Kelvin Cellar, MD  ibuprofen (ADVIL) 200 MG tablet Take 200 mg by mouth every 6 (six) hours as needed for moderate pain or headache.    [provider]  ipratropium-albuterol (DUONEB) 0.5-2.5 (3) MG/3ML SOLN Take 3 mLs by nebulization every 6 (six) hours as needed. 07/11/21  Maryjane Hurter, MD  levothyroxine (SYNTHROID) 100 MCG tablet Take 100 mcg by mouth daily. 02/05/21   [provider]  lisinopril (ZESTRIL) 10 MG tablet Take 10 mg by mouth daily.    [provider]  Nintedanib (OFEV) 150 MG CAPS Take 1 capsule (150 mg total) by mouth 2 (two) times daily. 10/28/21   Brand Males, MD  omeprazole (PRILOSEC) 20 MG capsule Take 20 mg by mouth daily before breakfast.    [provider]  OVER THE COUNTER MEDICATION Take 1 tablet by mouth daily as needed (allergies). Flonase headache & allergy relief    [provider]  Tiotropium  Bromide Monohydrate (SPIRIVA RESPIMAT) 1.25 MCG/ACT AERS Inhale 2 puffs into the lungs daily. Patient not taking: Reported on 09/15/2019 08/17/19 11/29/19  Brand Males, MD    Family History Family History  Problem Relation Age of Onset   Cancer Father        ? type   Asthma Brother     Social History Social History   Tobacco Use   Smoking status: Never   Smokeless tobacco: Former    Types: Snuff    Quit date: 02/03/2015  Vaping Use   Vaping Use: Never used  Substance Use Topics   Alcohol use: No   Drug use: No     Allergies   Ciprofloxacin and Crestor [rosuvastatin]   Review of Systems Review of Systems  All other systems reviewed and are negative.    Physical Exam Triage Vital Signs ED Triage Vitals  Enc Vitals Group     BP 12/06/21 1206 (!) 167/80     Pulse Rate 12/06/21 1206 73     Resp 12/06/21 1206 16     Temp 12/06/21 1206 98.1 F (36.7 C)     Temp Source 12/06/21 1206 Oral     SpO2 12/06/21 1206 98 %     Weight --      Height --      Head Circumference --      Peak Flow --      Pain Score 12/06/21 1205 8     Pain Loc --      Pain Edu? --      Excl. in [place]? --    No data found.  Updated Vital Signs BP (!) 167/80 (BP Location: Left Arm)   Pulse 73   Temp 98.1 F (36.7 C) (Oral)   Resp 16   SpO2 98%   Visual Acuity Right Eye Distance:   Left Eye Distance:   Bilateral Distance:    Right Eye Near:   Left Eye Near:    Bilateral Near:     Physical Exam Vitals and nursing note reviewed.  Constitutional:      Appearance: She is well-developed.  HENT:     Head: Normocephalic.  Cardiovascular:     Rate and Rhythm: Normal rate.  Pulmonary:     Effort: Pulmonary effort is normal.  Abdominal:     General: There is no distension.  Musculoskeletal:        General: Normal range of motion.     Cervical back: Normal range of motion.  Skin:    General: Skin is warm.  Neurological:     General: No focal deficit present.     Mental  Status: She is alert and oriented to person, place, and time.      UC Treatments / Results  Labs (all labs ordered are listed, but only abnormal results are displayed) Labs  Reviewed  RESP PANEL BY RT-PCR (FLU A&B, COVID) [id number]    EKG   Radiology No results found.  Procedures Procedures (including critical care time)  Medications Ordered in UC Medications - No data to display  Initial Impression / Assessment and Plan / UC Course  I have reviewed the triage vital signs and the nursing notes.  Pertinent labs & imaging results that were available during my care of the patient were reviewed by me and considered in my medical decision making (see chart for details).     MDM: Influenza and COVID ordered patient counseled on treatment for pharyngitis I recommended Tylenol and warm salt water gargles Final Clinical Impressions(s) / UC Diagnoses   Final diagnoses:  Acute pharyngitis, unspecified etiology     Discharge Instructions      Tylenol every 4 hours. Warm salt water gargles.     ED Prescriptions   None    PDMP not reviewed this encounter. An After Visit Summary was printed and given to the patient.       Fransico Meadow, [place] 12/12/21 1529

## 2021-12-17 ENCOUNTER — Telehealth (INDEPENDENT_AMBULATORY_CARE_PROVIDER_SITE_OTHER): Payer: Medicare Other | Admitting: Nurse Practitioner

## 2021-12-17 ENCOUNTER — Encounter: Payer: Self-pay | Admitting: Nurse Practitioner

## 2021-12-17 ENCOUNTER — Telehealth: Payer: Self-pay | Admitting: Internal Medicine

## 2021-12-17 DIAGNOSIS — J209 Acute bronchitis, unspecified: Secondary | ICD-10-CM | POA: Diagnosis not present

## 2021-12-17 DIAGNOSIS — J849 Interstitial pulmonary disease, unspecified: Secondary | ICD-10-CM

## 2021-12-17 HISTORY — DX: Acute bronchitis, unspecified: J20.9

## 2021-12-17 MED ORDER — PREDNISONE 20 MG PO TABS: 40.0000 mg | ORAL_TABLET | Freq: Every day | ORAL | 0 refills | Status: AC

## 2021-12-17 MED ORDER — BENZONATATE 200 MG PO CAPS: 200.0000 mg | ORAL_CAPSULE | Freq: Three times a day (TID) | ORAL | 1 refills | Status: DC | PRN

## 2021-12-17 MED ORDER — DOXYCYCLINE HYCLATE 100 MG PO TABS: 100.0000 mg | ORAL_TABLET | Freq: Two times a day (BID) | ORAL | 0 refills | Status: AC

## 2021-12-17 NOTE — Patient Instructions (Addendum)
Continue Albuterol inhaler 2 puffs or 3 mL neb every 6 hours as needed for shortness of breath or wheezing. Notify if symptoms persist despite rescue inhaler/neb use. Continue flonase nasal spray 2 sprays each nostril daily Continue nintedanib (Ofev) 1 capsule Three times a day. Take with food Continue omeprazole 1 capsule daily  Prednisone 40 mg daily for 5 days. Take in AM with food.  Doxycycline 1 tab Twice daily for 7 days. Take with food. Wear sunscreen and avoid excessive sun exposure while taking - increases risk for sunburns Benzonatate 1 capsule Three times a day as needed for cough  Guaifenesin [phone] mg Twice daily for congestion  Monitor oxygen levels at home for goal >88-90%  Follow up in 2 weeks with Dr. Chase Caller or Alanson Aly. If symptoms do not improve or worsen, please contact office for sooner follow up or seek emergency care.

## 2021-12-17 NOTE — Progress Notes (Signed)
Patient ID: [name], female     DOB: 14-Aug-1940, 81 y.o.      MRN: [id number]  Chief Complaint  Patient presents with   Acute Visit    SOB, productive cough, headache, and congestion x2 weeks    Virtual Visit via Video Note  I connected with [name] on 12/17/21 at  3:00 PM EST by a video enabled telemedicine application and verified that I am speaking with the correct person using two identifiers.  Location: Patient: Home Provider: Office    I discussed the limitations of evaluation and management by telemedicine and the availability of in person appointments. The patient expressed understanding and agreed to proceed.  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  She is patient [name] last seen in office 07/31/2021.  Past medical history significant for hypothyroidism, hypertension, systemic lupus, history of asthma.  07/31/2021: [name] with Kathryn Napoleon NP for follow-up.  She had been seen by [name] 07/03/2021 and diagnosed with CAP.  She was treated with Augmentin and azithromycin.  Feeling better.  Shortness of breath, cough and wheezing have resolved.  She has chronic dyspnea with moderate exertion.  No increase in baseline.  No oxygen requirements.  Currently under surveillance not on Ofev.  Will follow-up with [name] in October and discuss restarting.  Patient ended up contacting the office on 8/23 and wanted to go ahead and move forward with restarting antifibrotic therapy - restarted Ofev.   12/17/2021: Today-acute visit Patient presents today for acute visit via virtual visit with her daughter.  A few people in her family tested positive for RSV around 2 weeks ago.  She started having similar symptoms with cough, fatigue, headaches and sinus congestion.  Her symptoms have improved some but she continues to have a productive cough with yellow phlegm and feels like she is having a lot of chest congestion.  She  also feels slightly more short of breath compared to her baseline.  Still having some headaches and nasal congestion.  Denies any fevers, chills, hemoptysis, lower extremity swelling.  She is eating and drinking well.  Has not had any GI symptoms.  Has not been monitoring her oxygen levels.  She did go to urgent care on 11/11.  They swabbed her for COVID and flu which were both negative.  Did not perform any RSV testing.  No imaging was obtained.  She was discharged home with supportive care measures.  Allergies  Allergen Reactions   Ciprofloxacin Itching, Swelling and Other (See Comments)    Possibly causing tremors?   Crestor [Rosuvastatin] Other (See Comments)    Myalgia and back pain   Immunization History  Administered Date(s) Administered   Influenza, High Dose Seasonal PF 11/09/2016, 10/27/2018, 10/26/2020   Influenza,inj,Quad PF,6+ Mos 10/27/2015   PFIZER(Purple Top)SARS-COV-2 Vaccination 04/22/2019, 05/13/2019, 01/15/2020   Pneumococcal Conjugate-13 07/11/2013, 02/15/2018   Pneumococcal Polysaccharide-23 03/18/2020   Tdap 03/20/2021   Past Medical History:  Diagnosis Date   Breast cancer ([place])    Diabetes mellitus without complication ([place])    Hypertension    Hypothyroidism    Lupus ([place])     Tobacco History: Social History   Tobacco Use  Smoking Status Never  Smokeless Tobacco Former   Types: Snuff   Quit date: 02/03/2015   Counseling given: Not Answered   Outpatient Medications Prior to Visit  Medication Sig Dispense Refill   albuterol (VENTOLIN HFA) 108 (90 Base) MCG/ACT inhaler Inhale  2 puffs into the lungs every 4 (four) hours as needed for wheezing or shortness of breath. 18 g 6   diclofenac Sodium (VOLTAREN) 1 % GEL Apply 1 application. topically 4 (four) times daily as needed (pain).     ferrous sulfate 325 (65 FE) MG tablet Take 325 mg by mouth daily.     fluticasone (FLONASE) 50 MCG/ACT nasal spray Place 1 spray into both nostrils daily as needed for  allergies or rhinitis.     gabapentin (NEURONTIN) 300 MG capsule Take 300 mg by mouth every 8 (eight) hours.     glimepiride (AMARYL) 1 MG tablet Take 1 mg by mouth daily with breakfast.     hydroxychloroquine (PLAQUENIL) 200 MG tablet Take 1 tablet (200 mg total) by mouth daily. 30 tablet 0   ibuprofen (ADVIL) 200 MG tablet Take 200 mg by mouth every 6 (six) hours as needed for moderate pain or headache.     ipratropium-albuterol (DUONEB) 0.5-2.5 (3) MG/3ML SOLN Take 3 mLs by nebulization every 6 (six) hours as needed. 360 mL 6   levothyroxine (SYNTHROID) 100 MCG tablet Take 100 mcg by mouth daily.     lisinopril (ZESTRIL) 10 MG tablet Take 10 mg by mouth daily.     Nintedanib (OFEV) 150 MG CAPS Take 1 capsule (150 mg total) by mouth 2 (two) times daily. 60 capsule 5   omeprazole (PRILOSEC) 20 MG capsule Take 20 mg by mouth daily before breakfast.     OVER THE COUNTER MEDICATION Take 1 tablet by mouth daily as needed (allergies). Flonase headache & allergy relief     amLODipine (NORVASC) 2.5 MG tablet Take 1 tablet (2.5 mg total) by mouth daily. 30 tablet 3   No facility-administered medications prior to visit.     Review of Systems:   Constitutional: No weight loss or gain, night sweats, fevers, chills +fatigue, lassitude. HEENT: No difficulty swallowing, tooth/dental problems, or sore throat. No sneezing, itching, ear ache. +headaches, nasal congestion CV:  No chest pain, orthopnea, PND, swelling in lower extremities, anasarca, dizziness, palpitations, syncope Resp: +shortness of breath with exertion; productive cough; chest congestion. No hemoptysis. No wheezing. No chest wall deformity GI:  No heartburn, indigestion, abdominal pain, nausea, vomiting, diarrhea, change in bowel habits, loss of appetite, bloody stools.  GU: No dysuria, change in color of urine, urgency or frequency.   MSK:  No joint pain or swelling.   Neuro: No dizziness or lightheadedness.  Psych: No depression or  anxiety. Mood stable.   Observations/Objective: Patient is well-developed, well-nourished in no acute distress. A&Ox3. Resting comfortably at home. Unlabored, regular breathing. Speech is clear and coherent with logical content. Able to speak in complete sentences without difficulty.    Assessment and Plan: Acute bronchitis Suspect she has acute bronchitis with recent URI; possible IPF flare. She was seen in the ED on 11/11 without imaging. Instructed her that we are limited in workup with a virtual visit. Verbalized understanding. We will treat her with empiric doxycycline course and prednisone burst. If no improvement, she will need to come in for OV with CXR. Encouraged her to monitor her oxygen levels at home for goal >88-90%. She will go to the ED if she develops hypoxia or worsening symptoms. Cough control measures advised.   Patient Instructions  Continue Albuterol inhaler 2 puffs or 3 mL neb every 6 hours as needed for shortness of breath or wheezing. Notify if symptoms persist despite rescue inhaler/neb use. Continue flonase nasal spray 2 sprays each nostril  daily Continue nintedanib (Ofev) 1 capsule Three times a day. Take with food Continue omeprazole 1 capsule daily  Prednisone 40 mg daily for 5 days. Take in AM with food.  Doxycycline 1 tab Twice daily for 7 days. Take with food. Wear sunscreen and avoid excessive sun exposure while taking - increases risk for sunburns Benzonatate 1 capsule Three times a day as needed for cough  Guaifenesin [phone] mg Twice daily for congestion  Monitor oxygen levels at home for goal >88-90%  Follow up in 2 weeks with [name] or Alanson Aly. If symptoms do not improve or worsen, please contact office for sooner follow up or seek emergency care.     ILD (interstitial lung disease) ([place]) IPF on Ofev. Tolerating well today. No acute GI symptoms. See above plan.     I discussed the assessment and treatment plan with the patient. The  patient was provided an opportunity to ask questions and all were answered. The patient agreed with the plan and demonstrated an understanding of the instructions.   The patient was advised to call back or seek an in-person evaluation if the symptoms worsen or if the condition fails to improve as anticipated.  I provided 32 minutes of non-face-to-face time during this encounter.   Clayton Bibles, NP

## 2021-12-17 NOTE — Assessment & Plan Note (Signed)
IPF on Ofev. Tolerating well today. No acute GI symptoms. See above plan.

## 2021-12-17 NOTE — Assessment & Plan Note (Signed)
Suspect she has acute bronchitis with recent URI; possible IPF flare. She was seen in the ED on 11/11 without imaging. Instructed her that we are limited in workup with a virtual visit. Verbalized understanding. We will treat her with empiric doxycycline course and prednisone burst. If no improvement, she will need to come in for OV with CXR. Encouraged her to monitor her oxygen levels at home for goal >88-90%. She will go to the ED if she develops hypoxia or worsening symptoms. Cough control measures advised.   Patient Instructions  Continue Albuterol inhaler 2 puffs or 3 mL neb every 6 hours as needed for shortness of breath or wheezing. Notify if symptoms persist despite rescue inhaler/neb use. Continue flonase nasal spray 2 sprays each nostril daily Continue nintedanib (Ofev) 1 capsule Three times a day. Take with food Continue omeprazole 1 capsule daily  Prednisone 40 mg daily for 5 days. Take in AM with food.  Doxycycline 1 tab Twice daily for 7 days. Take with food. Wear sunscreen and avoid excessive sun exposure while taking - increases risk for sunburns Benzonatate 1 capsule Three times a day as needed for cough  Guaifenesin [phone] mg Twice daily for congestion  Monitor oxygen levels at home for goal >88-90%  Follow up in 2 weeks with Dr. Chase Caller or Alanson Aly. If symptoms do not improve or worsen, please contact office for sooner follow up or seek emergency care.

## 2021-12-17 NOTE — Telephone Encounter (Signed)
Called and spoke with pt's daughter who states that pt has been sick for about a week. This was after the family all had RSV and pt is not getting any better. Stated to her that we needed to schedule a visit and she verbalized understanding. Video visit scheduled for pt with Arrey at 3pm today. Nothing further needed.

## 2022-01-13 ENCOUNTER — Ambulatory Visit (INDEPENDENT_AMBULATORY_CARE_PROVIDER_SITE_OTHER): Payer: Medicare Other | Admitting: Internal Medicine

## 2022-01-13 ENCOUNTER — Encounter: Payer: Self-pay | Admitting: Internal Medicine

## 2022-01-13 VITALS — BP 120/64 | HR 68 | Temp 98.0°F | Ht 66.7 in | Wt 149.0 lb

## 2022-01-13 DIAGNOSIS — J849 Interstitial pulmonary disease, unspecified: Secondary | ICD-10-CM

## 2022-01-13 DIAGNOSIS — J84112 Idiopathic pulmonary fibrosis: Secondary | ICD-10-CM | POA: Diagnosis not present

## 2022-01-13 DIAGNOSIS — R768 Other specified abnormal immunological findings in serum: Secondary | ICD-10-CM | POA: Diagnosis not present

## 2022-01-13 DIAGNOSIS — M329 Systemic lupus erythematosus, unspecified: Secondary | ICD-10-CM | POA: Diagnosis not present

## 2022-01-13 LAB — PULMONARY FUNCTION TEST
DL/VA % pred: 91 %
DL/VA: 3.64 ml/min/mmHg/L
DLCO cor % pred: 87 %
DLCO cor: 18.06 ml/min/mmHg
DLCO unc % pred: 87 %
DLCO unc: 18.06 ml/min/mmHg
RV % pred: 107 %
RV: 2.69 L
TLC % pred: 89 %
TLC: 4.89 L

## 2022-01-13 NOTE — Patient Instructions (Addendum)
ICD-10-CM   1. ILD (interstitial lung disease) (H[facility])  J84.9     2. 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 ANA positive  R76.8     4. 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 [facility] late 2022 showed worsening. Clinically you believe you are stable in 2023 .  YOu tried ofev in ocotber 2023 for few days and stopped due to abdominal pain and nauseated. Respect not wanting to do this medicine again.  Noted difficulty doing PFT esp today   Plan (shared decision making with you and daughter [name])  - continue observation approach but need close followup  - mark Ofev as allergy  - will hold off on PFT testing for now - do HRCT in 3-4 months - return sooner if needed - consider ILD-PRO registry at next visit   Follow-up -35month and a 30-minute slot to interstitial lung disease clinic but after HRCT  -Symptom questionnaire and simple walk test at follow-up

## 2022-01-13 NOTE — Progress Notes (Signed)
Patient unable to complete spirometry. DLCO and lung volumes performed today.

## 2022-01-13 NOTE — Progress Notes (Signed)
HPI:06/03/2021 -   81 year old female, never smoked.  Past medical history significant for hypertension, ILD, hypothyroidism, history of asthma, systemic lupus.  Patient Dr. Chase Caller, last seen in office on 07/03/2021 by Dr. Verlee Monte for community-acquired pneumonia.        Chief Complaint  Patient presents with   Follow-up      PFT performed today.  Pt states she has been doing okay since last visit and denies any real complaints. States that every so often she will become SOB.   HPI [name] 81 y.o. -returns for follow-up.  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  However symptomatically she was stable.  Therefore we decided to see her again with pulmonary function test.  Pulmonary function test shows continued stability [see below].  Symptom wise she is also stable without any change.  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  However in the 2021 European respiratory general review (https://err.ersjournals.com/content/30/162/210177#T1)  -UIP is excluded from this classification unless there is specific clinical and serologic features.  She does have serologic factors of positive SSA and positive ANA at 1: 320.  However she does not have specific clinical features particularly of Raynaud's or digital tip ulceration or digital tip fissuring or palmar telangiectasia early morning joint stiffness or Raynaud's or unexplained digital edema Gottron sign.  In talking to her she says her rheumatologist is not Dr. Dossie Der.  She is not sure if she has a specific autoimmune diagnosis.  But she does follow-up.  Last visit was March 2023.  I do not have the notes with me.  In the past she has been labeled as lupus before she moved to [place].  Within rheumatologist Dr. Trudie Reed her previous rheumatologist did not think she had any specific connective tissue  disease.  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  Therefore we discussed antifibrotic therapy again.  Explained UIP is by prognostic marker and the preventative role of antifibrotic's.  Explained side effects.  She is not sure she wants to do it and she wants to reflect on it.  We discussed potential clinical trials and she wants to reflect on this as well.   07/03/21- Dr. Verlee Monte     Chief Complaint  Patient presents with   Acute Visit      Increased SOB and cough x 2 wks. Went to ED 07/02/21- dx with right infiltrate and prescribed doxy but she is hesitant about taking due to her lupus. Her cough has been prod with white sputum.     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 Chase Caller and considering antifibrotic, diastolic dysfunction, DM, breast cancer, hypothyroid.   Cough nonstop over last couple of weeks, increased dyspnea. Cough is still productive, pleuritic pain. Seen in urgent care 6/1 (given 3d prednisone which per pt was not helpful), 6/5 for cough (given tessalon perles for presumed postviral cough), nasal congestion. Seen in [facility] ED yesterday given doxy for CAP. Covid negative. No fever, hemoptysis.    Otherwise pertinent review of systems is negative.    07/31/2021- Interim hx  Patient presents today for 1 month follow-up for CAP.   She was treated with Augmentin and azithromycin. She is doing well today. Feels better. Shortness of  breath, cough and wheezing have resolved. She has chronic dyspnea with moderate exertion. No shortness of breath at rest or with simple ADLs. She has a rare dry cough. No oxygen requirements.: OV with Volanda Napoleon NP for follow-up.  She had been seen by Dr. Bjorn Loser 07/03/2021 and diagnosed with CAP.  She was treated with Augmentin and azithromycin.  Feeling better.  Shortness of breath, cough and wheezing have resolved.  She has chronic dyspnea with moderate exertion.   No increase in baseline.  No oxygen requirements.  Currently under surveillance not on Ofev.  Will follow-up with Dr. Chase Caller in October and discuss restarting.  Patient ended up contacting the office on 8/23 and wanted to go ahead and move forward with restarting antifibrotic therapy - restarted Ofev.   12/17/2021: Today-acute visit Patient presents today for acute visit via virtual visit with her daughter.  A few people in her family tested positive for RSV around 2 weeks ago.  She started having similar symptoms with cough, fatigue, headaches and sinus congestion.  Her symptoms have improved some but she continues to have a productive cough with yellow phlegm and feels like she is having a lot of chest congestion.  She also feels slightly more short of breath compared to her baseline.  Still having some headaches and nasal congestion.  Denies any fevers, chills, hemoptysis, lower extremity swelling.  She is eating and drinking well.  Has not had any GI symptoms.  Has not been monitoring her oxygen levels.  She did go to urgent care on 11/11.  They swabbed her for COVID and flu which were both negative.  Did not perform any RSV testing.  No imaging was obtained.  She was discharged home with supportive care measures.   OV 01/13/2022  Subjective:  Patient ID: [name], female , DOB: [birth year] , age 62 y.o. 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 Dossie Der for lupus and is on Plaquenil.  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 Normal PA/RV and LV             - similar sept 2022    01/13/2022 -   Chief Complaint  Patient presents with   Follow-up    PFT performed today.  Pt states she is about the same since last visit. States she has been sick twice recently.     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 -returns for follow-up.  After my last visit  she did call in and said that she was open to taking antifibrotic's.  We started this in October 2023.  However she took only for few days and then stopped because of abdominal pain and nausea and other GI side effects.  Her daughter [name] advised her also not to take the medicines after reading the side effects.  She tells me today that she does not want to go back on taking nintedanib.She tells me overall she is stable for the year of 2023.  Daughter wanted know what other options there.  We discussed pirfenidone.  Daughter was interested in patient taking pirfenidone but patient rejected this idea because of significant GI side effects even with pirfenidone.  Daughter was also interested in knowing about other possible therapeutic options.  Explained to her that only nintedanib and pirfenidone exist.  Everything else outside this would be a clinical trial.  There are some upcoming trials with inhaled pirfenidone and other agents.  Discussed the concept of clinical trials.  They are processing this information.     Daughter was interested in knowing the natural history of pulmonary fibrosis and the future prognosis.  Did explain progressive phenotype and the likelihood of ending up on oxygen class III-4 dyspnea in the  future.   Of note the daughter states she got exposed to kids with RSV and has been fatigued this was right before Thanksgiving 2023.  Currently patient feels fine but the daughter feels she is more fatigued.  Walking desaturation test shows that she is more fatigued and she cannot do her 3 labs but there is no change in the heart rate n or pulse ox compared to the past.  She denies any fever or chills or sputum production or worsening cough.     SYMPTOM SCALE - ILD 06/14/2019   08/17/2019   05/07/2020   02/14/2021   06/03/2021   07/31/2021  01/13/2022   O2 use _0  RA ra  Shortness of Breath 0 -> 5 scale with 5 being worst (score 6 If unable to do)            At rest 0 2 0 0 2 0 0  Simple tasks - showers, clothes change, eating, shaving _1 Household (dishes, doing bed, laundry) _2 0 2  Shopping _3 Walking level at own pace _4 0 2  Walking up Stairs _5 Total (30-36) Dyspnea Score _6 How bad is your cough? 2   1 0 0 1 1  How bad is your fatigue _7 How bad is nausea 00   0 0 0 0 0  How bad is vomiting?   0   0 0 0 0 0  How bad is diarrhea? 0   0 0 0 0 0  How bad is anxiety? 0 0 0 2 0 3 0  How bad is depression 0 1 0 3 0 1 0       Simple office walk 185 feet x  3 laps goal with forehead probe 08/18/2017       02/15/2018   06/14/2019   05/07/2020   02/14/2021   06/03/21   07/31/2021  01/13/2022   O2 used *room air Room air ra ra ra   RA  ra  Number laps completed _0 of 3  Comments about pace Normal pace Slow pace Slow pace slow Slow pace   Slow pace  Slow pace  Resting Pulse Ox/HR 100% and 67/min 100% and 73/min 100% and 63/mi 100% and 67/min 100% and 64   100% and 71 100% and HR 68  Final Pulse Ox/HR 100% and 91/min 100% and 96/min 92% and 84/mi 100% and 91/ 100% and 91   98% and 88 98% and HR 92  Desaturated </= 88% no No  no no no   No no  Desaturated <= 3% points no no Yues, 8  ponts no no   No no  Got Tachycardic >/= 90/min yes yes no yes yes   No no  Symptoms at end of test _1    Mild dyspnea Stopped early due to fatigue  Miscellaneous comments x x     x   x     PFT     Latest Ref Rng & Units 01/13/2022   12:32 PM 06/03/2021   10:37 AM 02/14/2021   12:36 PM 05/18/2017   12:44 PM  PFT Results  FVC-Pre L  1.90  1.84  1.61   FVC-Predicted Pre %  80  76  63   Pre FEV1/FVC % %  82  84  93   FEV1-Pre L  1.56  1.54  1.51   FEV1-Predicted Pre %  84  82  76   DLCO uncorrected ml/min/mmHg 18.06  P 9.75  9.78    DLCO UNC% % 87  P 47  47    DLCO corrected ml/min/mmHg 18.06  P 10.11  9.78    DLCO COR %Predicted % 87  P 48  47    DLVA Predicted % 91  P 76  70    TLC L 4.89  P     TLC % Predicted % 89  P     RV % Predicted % 107  P       P Preliminary result       has a past medical history of Breast cancer ([place]), Diabetes mellitus without complication ([place]), Hypertension, Hypothyroidism, and Lupus ([place]).   reports that she has never smoked. She quit smokeless tobacco use about 6 years ago.  Her smokeless tobacco use included snuff.  Past Surgical History:  Procedure Laterality Date   BREAST LUMPECTOMY  2009   IR 3D INDEPENDENT WKST  05/29/2021   IR ANGIO INTRA EXTRACRAN SEL COM CAROTID INNOMINATE BILAT MOD SED  05/29/2021   IR ANGIO VERTEBRAL SEL VERTEBRAL BILAT MOD SED  05/29/2021    Allergies  Allergen Reactions   Ciprofloxacin Itching, Swelling and Other (See Comments)    Possibly causing tremors?   Crestor [Rosuvastatin] Other (See Comments)    Myalgia and back pain   Ofev [Nintedanib]     Immunization History  Administered Date(s) Administered   Influenza, High Dose Seasonal PF 11/09/2016, 10/27/2018, 10/26/2020   Influenza,inj,Quad PF,6+ Mos 10/27/2015   PFIZER(Purple Top)SARS-COV-2 Vaccination 04/22/2019, 05/13/2019, 01/15/2020   Pneumococcal Conjugate-13 07/11/2013, 02/15/2018    Pneumococcal Polysaccharide-23 03/18/2020   Tdap 03/20/2021    Family History  Problem Relation Age of Onset   Cancer Father        ? type   Asthma Brother      Current Outpatient Medications:    diclofenac Sodium (VOLTAREN) 1 % GEL, Apply 1 application.  topically 4 (four) times daily as needed (pain)., Disp: , Rfl:    ferrous sulfate 325 (65 FE) MG tablet, Take 325 mg by mouth daily., Disp: , Rfl:    fluticasone (FLONASE) 50 MCG/ACT nasal spray, Place 1 spray into both nostrils daily as needed for allergies or rhinitis., Disp: , Rfl:    gabapentin (NEURONTIN) 300 MG capsule, Take 300 mg by mouth every 8 (eight) hours., Disp: , Rfl:    glimepiride (AMARYL) 1 MG tablet, Take 1 mg by mouth daily with breakfast., Disp: , Rfl:    hydroxychloroquine (PLAQUENIL) 200 MG tablet, Take 1 tablet (200 mg total) by mouth daily., Disp: 30 tablet, Rfl: 0   ibuprofen (ADVIL) 200 MG tablet, Take 200 mg by mouth every 6 (six) hours as needed for moderate pain or headache., Disp: , Rfl:    ipratropium-albuterol (DUONEB) 0.5-2.5 (3) MG/3ML SOLN, Take 3 mLs by nebulization every 6 (six) hours as needed., Disp: 360 mL, Rfl: 6   levothyroxine (SYNTHROID) 100 MCG tablet, Take 100 mcg by mouth daily., Disp: , Rfl:    lisinopril (ZESTRIL) 10 MG tablet, Take 10 mg by mouth daily., Disp: , Rfl:    omeprazole (PRILOSEC) 20 MG capsule, Take 20 mg by mouth daily before breakfast., Disp: , Rfl:    OVER THE COUNTER MEDICATION, Take 1 tablet by mouth daily as needed (allergies). Flonase headache & allergy relief, Disp: , Rfl:    albuterol (VENTOLIN HFA) 108 (90 Base) MCG/ACT inhaler, Inhale 2 puffs into the lungs every 4 (four) hours as needed for wheezing or shortness of breath. (Patient not taking: Reported on 01/13/2022), Disp: 18 g, Rfl: 6      Objective:   Vitals:   01/13/22 1439  BP: 120/64  Pulse: 68  Temp: 98 F (36.7 C)  TempSrc: Oral  SpO2: 100%  Weight: 149 lb (67.6 kg)  Height: 5' 6.7" (1.694 m)     Estimated body mass index is 23.55 kg/m as calculated from the following:   Height as of this encounter: 5' 6.7" (1.694 m).   Weight as of this encounter: 149 lb (67.6 kg).  _0 @  Filed Weights   01/13/22 1439  Weight: 149 lb (67.6 kg)     Physical Exam    General: No distress. Looks stable but somewhat physically deconditioned Neuro: Alert and Oriented x 3. GCS 15. Speech normal Psych: Pleasant Resp:  Barrel Chest - no.  Wheeze - no, Crackles - yes, No overt respiratory distress CVS: Normal heart sounds. Murmurs - no Ext: Stigmata of Connective Tissue Disease - no HEENT: Normal upper airway. PEERL +. No post nasal drip        Assessment:       ICD-10-CM   1. ILD (interstitial lung disease) (HCC)  J84.9     2. 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 ANA positive  R76.8     4. History of systemic lupus erythematosus (SLE) ([place])  M32.9          Plan:     Patient Instructions     ICD-10-CM   1. ILD (interstitial lung disease) (HCC)  J84.9     2. 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 ANA positive  R76.8     4. 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 Ct san chest late 2022 showed worsening. Clinically  you believe you are stable in 2023 .  YOu tried ofev in ocotber 2023 for few days and stopped due to abdominal pain and nauseated. Respect not wanting to do this medicine again.  Noted difficulty doing PFT esp today   Plan (shared decision making with you and daughter [name])  - continue observation approach but need close followup  - mark Ofev as allergy  - will hold off on PFT testing for now - do HRCT in 3-4 months - return sooner if needed - consider ILD-PRO registry at next visit   Follow-up -51month and a 30-minute slot to interstitial lung disease clinic but after HRCT  -Symptom questionnaire and simple walk  test at follow-up   ( Level 05 visit: Estb 40-54 min n  visit type: on-site physical face to visit  in total care time and counseling or/and coordination of care by this undersigned MD - Dr MBrand Males This includes one or more of the following on this same day 01/13/2022: pre-charting, chart review, note writing, documentation discussion of test results, diagnostic or treatment recommendations, prognosis, risks and benefits of management options, instructions, education, compliance or risk-factor reduction. It excludes time spent by the [facility] office staff in the care of the patient. Actual time 440min)    SIGNATURE    Dr. MBrand Males M.D., F.C.C.P,  Pulmonary and Critical Care Medicine Staff Physician, CSewardDirector - Interstitial Lung Disease  Program  Pulmonary [place] [address] Pager: [phone] If no answer or between  15:00h - 7:00h: call [phone] Telephone: [phone]  3:33 PM 01/13/2022

## 2022-01-13 NOTE — Patient Instructions (Signed)
DLCO and lung volumes performed today.

## 2022-03-26 ENCOUNTER — Encounter: Payer: Self-pay | Admitting: Internal Medicine

## 2022-03-26 ENCOUNTER — Ambulatory Visit (INDEPENDENT_AMBULATORY_CARE_PROVIDER_SITE_OTHER): Payer: 59 | Admitting: Internal Medicine

## 2022-03-26 VITALS — BP 108/62 | HR 74 | Temp 97.9°F | Ht 67.0 in | Wt 147.2 lb

## 2022-03-26 DIAGNOSIS — R768 Other specified abnormal immunological findings in serum: Secondary | ICD-10-CM

## 2022-03-26 DIAGNOSIS — J84112 Idiopathic pulmonary fibrosis: Secondary | ICD-10-CM | POA: Diagnosis not present

## 2022-03-26 DIAGNOSIS — M359 Systemic involvement of connective tissue, unspecified: Secondary | ICD-10-CM

## 2022-03-26 DIAGNOSIS — M329 Systemic lupus erythematosus, unspecified: Secondary | ICD-10-CM

## 2022-03-26 DIAGNOSIS — J8489 Other specified interstitial pulmonary diseases: Secondary | ICD-10-CM

## 2022-03-26 NOTE — Patient Instructions (Addendum)
ICD-10-CM   1. Interstitial lung disease due to connective tissue disease ([place])  J84.89    M35.9     2. 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 ANA positive  R76.8     4. 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 Ct san chest late 2022 showed worsening. Clinically you seemed stable in 2023  but might be getting worse now in feb 2024.    Plan  - do HRCT in next few weeks - do echo in next few wweks - do cbc, bmet, lft, bnp 03/26/2022 - do blood quantiferon gold 03/26/2022 - do ono test on room air     Follow-up-  3 weeks to see APP but after completing above  - might need o2 based on results  - might need discussion on esbriet or immuran or cellcept  - might need repeat walk test at followup

## 2022-03-26 NOTE — Progress Notes (Signed)
I:06/03/2021 -   82 year old female, never smoked.  Past medical history significant for hypertension, ILD, hypothyroidism, history of asthma, systemic lupus.  Patient Dr. Chase Caller, last seen in office on 07/03/2021 by Dr. Verlee Monte for community-acquired pneumonia.        Chief Complaint  Patient presents with   Follow-up      PFT performed today.  Pt states she has been doing okay since last visit and denies any real complaints. States that every so often she will become SOB.   HPI [name] 82 y.o. -returns for follow-up.  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  However symptomatically she was stable.  Therefore we decided to see her again with pulmonary function test.  Pulmonary function test shows continued stability [see below].  Symptom wise she is also stable without any change.  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  However in the 2021 European respiratory general review (https://err.ersjournals.com/content/30/162/210177#T1)  -UIP is excluded from this classification unless there is specific clinical and serologic features.  She does have serologic factors of positive SSA and positive ANA at 1: 320.  However she does not have specific clinical features particularly of Raynaud's or digital tip ulceration or digital tip fissuring or palmar telangiectasia early morning joint stiffness or Raynaud's or unexplained digital edema Gottron sign.  In talking to her she says her rheumatologist is not Dr. Dossie Der.  She is not sure if she has a specific autoimmune diagnosis.  But she does follow-up.  Last visit was March 2023.  I do not have the notes with me.  In the past she has been labeled as lupus before she moved to [place].  Within rheumatologist Dr. Trudie Reed her previous rheumatologist did not think she had any specific connective tissue  disease.  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  Therefore we discussed antifibrotic therapy again.  Explained UIP is by prognostic marker and the preventative role of antifibrotic's.  Explained side effects.  She is not sure she wants to do it and she wants to reflect on it.  We discussed potential clinical trials and she wants to reflect on this as well.   07/03/21- Dr. Verlee Monte     Chief Complaint  Patient presents with   Acute Visit      Increased SOB and cough x 2 wks. Went to ED 07/02/21- dx with right infiltrate and prescribed doxy but she is hesitant about taking due to her lupus. Her cough has been prod with white sputum.     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 Chase Caller and considering antifibrotic, diastolic dysfunction, DM, breast cancer, hypothyroid.   Cough nonstop over last couple of weeks, increased dyspnea. Cough is still productive, pleuritic pain. Seen in urgent care 6/1 (given 3d prednisone which per pt was not helpful), 6/5 for cough (given tessalon perles for presumed postviral cough), nasal congestion. Seen in [facility] ED yesterday given doxy for CAP. Covid negative. No fever, hemoptysis.    Otherwise pertinent review of systems is negative.    07/31/2021- Interim hx  Patient presents today for 1 month follow-up for CAP.   She was treated with Augmentin and azithromycin. She is doing well today. Feels better. Shortness  of breath, cough and wheezing have resolved. She has chronic dyspnea with moderate exertion. No shortness of breath at rest or with simple ADLs. She has a rare dry cough. No oxygen requirements.: OV with Volanda Napoleon NP for follow-up.  She had been seen by Dr. Bjorn Loser 07/03/2021 and diagnosed with CAP.  She was treated with Augmentin and azithromycin.  Feeling better.  Shortness of breath, cough and wheezing have resolved.  She has chronic dyspnea with moderate exertion.   No increase in baseline.  No oxygen requirements.  Currently under surveillance not on Ofev.  Will follow-up with Dr. Chase Caller in October and discuss restarting.  Patient ended up contacting the office on 8/23 and wanted to go ahead and move forward with restarting antifibrotic therapy - restarted Ofev.   12/17/2021: Today-acute visit Patient presents today for acute visit via virtual visit with her daughter.  A few people in her family tested positive for RSV around 2 weeks ago.  She started having similar symptoms with cough, fatigue, headaches and sinus congestion.  Her symptoms have improved some but she continues to have a productive cough with yellow phlegm and feels like she is having a lot of chest congestion.  She also feels slightly more short of breath compared to her baseline.  Still having some headaches and nasal congestion.  Denies any fevers, chills, hemoptysis, lower extremity swelling.  She is eating and drinking well.  Has not had any GI symptoms.  Has not been monitoring her oxygen levels.  She did go to urgent care on 11/11.  They swabbed her for COVID and flu which were both negative.  Did not perform any RSV testing.  No imaging was obtained.  She was discharged home with supportive care measures.   OV 01/13/2022  Subjective:  Patient ID: [name], female , DOB: 1940-03-23 , age 82 y.o. , MRN: [id number] , ADDRESS: [address] [place] [address] PCP Carol Ada, MD Patient Care Team: Carol Ada, MD as PCP - General (Family Medicine) Gavin Pound, MD as Consulting Physician (Rheumatology)  This Provider for this visit: Treatment Team:  Attending Provider: Brand Males, MD  01/13/2022 -   Chief Complaint  Patient presents with   Follow-up    PFT performed today.  Pt states she is about the same since last visit. States she has been sick twice recently.     HPI [name] 82 y.o. -returns for follow-up.  After my last visit she did  call in and said that she was open to taking antifibrotic's.  We started this in October 2023.  However she took only for few days and then stopped because of abdominal pain and nausea and other GI side effects.  Her daughter [name] advised her also not to take the medicines after reading the side effects.  She tells me today that she does not want to go back on taking nintedanib.She tells me overall she is stable for the year of 2023.  Daughter wanted know what other options there.  We discussed pirfenidone.  Daughter was interested in patient taking pirfenidone but patient rejected this idea because of significant GI side effects even with pirfenidone.  Daughter was also interested in knowing about other possible therapeutic options.  Explained to her that only nintedanib and pirfenidone exist.  Everything else outside this would be a clinical trial.  There are some upcoming trials with inhaled pirfenidone and other agents.  Discussed the concept of clinical trials.  They are processing this  information.     Daughter was interested in knowing the natural history of pulmonary fibrosis and the future prognosis.  Did explain progressive phenotype and the likelihood of ending up on oxygen class III-4 dyspnea in the future.   Of note the daughter states she got exposed to kids with RSV and has been fatigued this was right before Thanksgiving 2023.  Currently patient feels fine but the daughter feels she is more fatigued.  Walking desaturation test shows that she is more fatigued and she cannot do her 3 labs but there is no change in the heart rate n or pulse ox compared to the past.  She denies any fever or chills or sputum production or worsening cough.      OV 03/26/2022  Subjective:  Patient ID: [name], female , DOB: [birth year] , age 65 y.o. 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 Dossie Der for lupus and is on Plaquenil.  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 Normal PA/RV and LV             - similar sept 2022     03/26/2022 -   Chief Complaint  Patient presents with   Follow-up    SOB seems worse.  Hard to breathe at times.  Needs albuterol MDI.      HPI [name] 82 y.o. -returns for follow-up for the above issue.  Initially she said she was doing stable but I think continue to talk to her she said that she might be feeling a little more short of breath on exertion relieved by rest than before.  She then said that was just a single episode 2 weeks ago when she walked to the truck she got quite short of breath and had to stop but then it resolved and she feels baseline but in evaluation of the symptom score sheet her symptoms are much worse than previous visit in December 2023.  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  And she had previously failed nintedanib and she already has slowly progressive disease.  The technician CMA walked her today for single pulse oximetry she was not sure about the accuracy and the reading but it appears patient might be desaturating.  Her last CT scan was in fall 2022.  She was supposed to have a CT  scan at this visit but I am unclear why this has not happened and  patient's not sure either.  Patient does have does not want to have pulmonary function test.  Her last echocardiogram was also in September 2022.  She is at risk candidate for pulmonary hypertension.  She denies any fever or chills or nausea vomiting or cough or wheezing no changes in medicines no new medical issues.  She also has baseline anemia and this has not been rechecked in a while.  Based on test review. SYMPTOM SCALE - ILD 06/14/2019   08/17/2019   05/07/2020   02/14/2021   06/03/2021   07/31/2021  01/13/2022  03/26/2022   O2 use ra ra ra ra ra RA ra ra  Shortness of Breath 0 -> 5 scale with 5 being worst (score 6 If unable to do)             At rest 0 2 0 0 2 0 0 2  Simple tasks - showers, clothes change, eating, shaving '1 2 1 4 1 1 1 4  '$ Household (dishes, doing bed, laundry) '3 3 4 4 1 '$ 0 2 4  Shopping '5 4 5 5 3 5 3 4  '$ Walking level at own pace '3 3 3 3 2 '$ 0 2 3  Walking up Stairs '5 5 4 5 5 5 5 5  '$ Total (30-36) Dyspnea Score '17 19 17 21 14 11 13 22  '$ How bad is your cough? 2   1 0 0 1 1 0  How bad is your fatigue '3   3 3 1 2 3 '$ 0  How bad is nausea 00   0 0 0 0 0 0  How bad is vomiting?   0   0 0 0 0 0 0  How bad is diarrhea? 0   0 0 0 0 0 0  How bad is anxiety? 0 0 0 2 0 3 0 0  How bad is depression 0 1 0 3 0 1 0 1       Simple office walk 185 feet x  3 laps goal with forehead probe 08/18/2017       02/15/2018   06/14/2019   05/07/2020   02/14/2021   06/03/21   07/31/2021  01/13/2022  03/26/2022   O2 used *room air Room air ra ra ra   RA ra ra  Number laps completed '3 3 3 3 3   3 2 '$ of 3  Of 3  Comments about pace Normal pace Slow pace Slow pace slow Slow pace   Slow pace  Slow pace   Resting Pulse Ox/HR 100% and 67/min 100% and 73/min 100% and 63/mi 100% and 67/min 100% and 64   100% and 71 100% and HR 68 98% and hr 89  Final Pulse Ox/HR 100% and 91/min 100% and 96/min 92% and 84/mi 100% and 91/ 100% and 91   98%  and 88 98% and HR 92 85% and HR 92  Desaturated </= 88% no No  no no no   No no   Desaturated <= 3% points no no Yues, 8 ponts no no   No no   Got Tachycardic >/= 90/min yes yes no yes yes   No no   Symptoms at end of test Mild dyspnea Mild dyspnea Mild dyspnea Mild dyspnea Mild dyspnea   Mild dyspnea Stopped early due to fatigue   Miscellaneous comments x x     x   x  TECHNICAIA  UNSURE OF ACCCURACE    CT Chest data - HRCT OCt 2022  Narrative & Impression  CLINICAL DATA:  Interstitial lung disease, dyspnea on exertion, history of left breast cancer and radiation   EXAM: CT CHEST WITHOUT CONTRAST   TECHNIQUE: Multidetector CT imaging of the chest was performed following the standard protocol without intravenous contrast. High resolution imaging of the lungs, as well as inspiratory and expiratory imaging, was performed.   COMPARISON:  07/27/2019 02/11/2018, 02/15/2017, 07/22/2016   FINDINGS: Cardiovascular: Aortic atherosclerosis. Normal heart size. Three-vessel coronary artery calcifications. No pericardial effusion.   Mediastinum/Nodes: No enlarged mediastinal, hilar, or axillary lymph nodes. Small hiatal hernia. Thyroid gland, trachea, and esophagus demonstrate no significant findings.   Lungs/Pleura: Redemonstrated moderate pulmonary fibrosis in a pattern with apical to basal gradient, featuring irregular peripheral interstitial opacity, septal thickening, traction bronchiectasis, subpleural bronchiolectasis, and areas of honeycombing at the lung bases. Fibrotic findings are slightly worsened compared to prior examination dated 07/27/2019 and clearly worsened over time on examinations dating back to 07/22/2016. No significant air trapping on expiratory phase imaging. No pleural effusion or pneumothorax.   Upper Abdomen: No acute abnormality.   Musculoskeletal: Calcified mass of the medial left breast, unchanged (series 2, image 89). No suspicious bone lesions  identified.   IMPRESSION: 1. Redemonstrated moderate pulmonary fibrosis in a pattern with apical to basal gradient, featuring irregular peripheral interstitial opacity, septal thickening, traction bronchiectasis, subpleural bronchiolectasis, and areas of honeycombing at the lung bases. Fibrotic findings are slightly worsened compared to prior examination dated 07/27/2019 and clearly worsened over time on examinations dating back to 07/22/2016. Findings are consistent with UIP per consensus guidelines: Diagnosis of Idiopathic Pulmonary Fibrosis: An Official ATS/ERS/JRS/ALAT Clinical Practice Guideline. [place], Iss 5, [phone], Sep 26 2016. 2. Coronary artery disease.   Aortic Atherosclerosis (ICD10-I70.0).     Electronically Signed   By: Delanna Ahmadi M.D.   On: 11/11/2020 09:12    No results found.    PFT     Latest Ref Rng & Units 01/13/2022   12:32 PM 06/03/2021   10:37 AM 02/14/2021   12:36 PM 05/18/2017   12:44 PM  PFT Results  FVC-Pre L  1.90  1.84  1.61   FVC-Predicted Pre %  80  76  63   Pre FEV1/FVC % %  82  84  93   FEV1-Pre L  1.56  1.54  1.51   FEV1-Predicted Pre %  84  82  76   DLCO uncorrected ml/min/mmHg 18.06  9.75  9.78    DLCO UNC% % 87  47  47    DLCO corrected ml/min/mmHg 18.06  10.11  9.78    DLCO COR %Predicted % 87  48  47    DLVA Predicted % 91  76  70    TLC L 4.89      TLC % Predicted % 89      RV % Predicted % 107           has a past medical history of Breast cancer ([place]), Diabetes mellitus without complication ([place]), Hypertension, Hypothyroidism, and Lupus ([place]).   reports that she has never smoked. She quit smokeless tobacco use about 7 years ago.  Her smokeless tobacco use included snuff.  Past Surgical History:  Procedure Laterality Date   BREAST LUMPECTOMY  2009   IR 3D INDEPENDENT WKST  05/29/2021   IR ANGIO INTRA EXTRACRAN SEL COM CAROTID INNOMINATE BILAT MOD SED  05/29/2021   IR ANGIO VERTEBRAL SEL  VERTEBRAL BILAT MOD SED  05/29/2021  Allergies  Allergen Reactions   Ciprofloxacin Itching, Swelling and Other (See Comments)    Possibly causing tremors?   Crestor [Rosuvastatin] Other (See Comments)    Myalgia and back pain   Ofev [Nintedanib] Nausea Only    Immunization History  Administered Date(s) Administered   Influenza, High Dose Seasonal PF 11/09/2016, 10/27/2018, 10/26/2020   Influenza,inj,Quad PF,6+ Mos 10/27/2015   PFIZER(Purple Top)SARS-COV-2 Vaccination 04/22/2019, 05/13/2019, 01/15/2020   Pneumococcal Conjugate-13 07/11/2013, 02/15/2018   Pneumococcal Polysaccharide-23 03/18/2020   Tdap 03/20/2021    Family History  Problem Relation Age of Onset   Cancer Father        ? type   Asthma Brother      Current Outpatient Medications:    albuterol (VENTOLIN HFA) 108 (90 Base) MCG/ACT inhaler, Inhale 2 puffs into the lungs every 4 (four) hours as needed for wheezing or shortness of breath., Disp: 18 g, Rfl: 6   diclofenac Sodium (VOLTAREN) 1 % GEL, Apply 1 application. topically 4 (four) times daily as needed (pain)., Disp: , Rfl:    gabapentin (NEURONTIN) 300 MG capsule, Take 300 mg by mouth every 8 (eight) hours., Disp: , Rfl:    glimepiride (AMARYL) 1 MG tablet, Take 1 mg by mouth daily with breakfast., Disp: , Rfl:    hydroxychloroquine (PLAQUENIL) 200 MG tablet, Take 1 tablet (200 mg total) by mouth daily., Disp: 30 tablet, Rfl: 0   ibuprofen (ADVIL) 200 MG tablet, Take 200 mg by mouth every 6 (six) hours as needed for moderate pain or headache., Disp: , Rfl:    ipratropium-albuterol (DUONEB) 0.5-2.5 (3) MG/3ML SOLN, Take 3 mLs by nebulization every 6 (six) hours as needed., Disp: 360 mL, Rfl: 6   levothyroxine (SYNTHROID) 100 MCG tablet, Take 100 mcg by mouth daily., Disp: , Rfl:    lisinopril (ZESTRIL) 10 MG tablet, Take 10 mg by mouth daily., Disp: , Rfl:    omeprazole (PRILOSEC) 20 MG capsule, Take 20 mg by mouth daily before breakfast., Disp: , Rfl:     ferrous sulfate 325 (65 FE) MG tablet, Take 325 mg by mouth daily. (Patient not taking: Reported on 03/26/2022), Disp: , Rfl:    fluticasone (FLONASE) 50 MCG/ACT nasal spray, Place 1 spray into both nostrils daily as needed for allergies or rhinitis. (Patient not taking: Reported on 03/26/2022), Disp: , Rfl:    OVER THE COUNTER MEDICATION, Take 1 tablet by mouth daily as needed (allergies). Flonase headache & allergy relief (Patient not taking: Reported on 03/26/2022), Disp: , Rfl:       Objective:   Vitals:   03/26/22 1444  BP: 108/62  Pulse: 74  Temp: 97.9 F (36.6 C)  TempSrc: Oral  SpO2: 97%  Weight: 147 lb 3.2 oz (66.8 kg)  Height: '5\' 7"'$  (1.702 m)    Estimated body mass index is 23.05 kg/m as calculated from the following:   Height as of this encounter: '5\' 7"'$  (1.702 m).   Weight as of this encounter: 147 lb 3.2 oz (66.8 kg).  '@WEIGHTCHANGE'$ @  [employer]   03/26/22 1444  Weight: 147 lb 3.2 oz (66.8 kg)     Physical Exam    General: No distress. Looks well Neuro: Alert and Oriented x 3. GCS 15. Speech normal Psych: Pleasant Resp:  Barrel Chest - no.  Wheeze - no, Crackles - YES BASE, No overt respiratory distress CVS: Normal heart sounds. Murmurs - no Ext: Stigmata of Connective Tissue Disease - SLE + HEENT: Normal upper airway. PEERL +. No post  nasal drip        Assessment:       ICD-10-CM   1. Interstitial lung disease due to connective tissue disease ([place])  J84.89    M35.9     2. 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 ANA positive  R76.8     4. History of systemic lupus erythematosus (SLE) ([place])  M32.9          Plan:     Patient Instructions     ICD-10-CM   1. Interstitial lung disease due to connective tissue disease ([place])  J84.89    M35.9     2. UIP (usual interstitial pneumonitis) ([facility])  J84.112     3. ANA positive  R76.8     4. History of systemic lupus erythematosus (SLE) ([facility])  M32.9        Pulmonary  fibrosis is from SLE. It is slowly progressive. Ct san chest late 2022 showed worsening. Clinically you seemed stable in 2023  but might be getting worse now in feb 2024.    Plan  - do HRCT in next few weeks - do echo in next few wweks - do cbc, bmet, lft, bnp 03/26/2022 - do blood quantiferon gold 03/26/2022 - do ono test on room air     Follow-up-  3 weeks to see APP but after completing above  - might need o2 based on results  - might need discussion on esbriet or immuran or cellcept    SIGNATURE    Dr. Brand Males, M.D., F.C.C.P,  Pulmonary and Critical Care Medicine Staff Physician, [place] Director - Interstitial Lung Disease  Program  [facility] [place], [place], [address]  Pager: [phone], If no answer or between  15:00h - 7:00h: call [phone] Telephone: [phone]  3:19 PM 03/26/2022  Moderate Complexity MDM NEW OFFICE  The table below is from the 2021 E/M guidelines, first released in 2021, with minor revisions added in 2023. Must meet the requirements for 2 out of 3 dimensions to qualify.    Number and complexity of problems addressed Amount and/or complexity of data reviewed Risk of complications and/or morbidity  One or more chronic illness with mild exacerbation, progression, or side effects of treatment  Two or more stable chronic illnesses  One undiagnosed new problem with uncertain prognosis  One acute illness with systemic symptoms   Acute complicated injury Must meet the requirements for 1 of 3 of the categories)  Category 1: Tests and documents, historian  Any combination of 3 of the following:  Assessment requiring an independent historian  Review of prior external records  Review of results of each unique test  Ordering of each unique test    Category 2: Interpretation of tests  Independent interpretation of a test perfromed by another  physician/NPP  Category 3: Discuss management/tests  Discussion of magagement or tests with an external physician/NPP Prescription drug management  Decision regarding minor surgery with identfied patient or procedure risk factors  Decision regarding elective major surgery without identified patient or procedure risk factors  Diagnosis or treatment significantly limited by social determinants of health

## 2022-03-27 LAB — CBC WITH DIFFERENTIAL/PLATELET
Basophils Absolute: 0.1 10*3/uL (ref 0.0–0.1)
Basophils Relative: 1 % (ref 0.0–3.0)
Eosinophils Absolute: 0.1 10*3/uL (ref 0.0–0.7)
Eosinophils Relative: 1.9 % (ref 0.0–5.0)
HCT: 37.4 % (ref 36.0–46.0)
Hemoglobin: 12.6 g/dL (ref 12.0–15.0)
Lymphocytes Relative: 38.9 % (ref 12.0–46.0)
Lymphs Abs: 2.7 10*3/uL (ref 0.7–4.0)
MCHC: 33.7 g/dL (ref 30.0–36.0)
MCV: 89.7 fl (ref 78.0–100.0)
Monocytes Absolute: 0.5 10*3/uL (ref 0.1–1.0)
Monocytes Relative: 7.6 % (ref 3.0–12.0)
Neutro Abs: 3.5 10*3/uL (ref 1.4–7.7)
Neutrophils Relative %: 50.6 % (ref 43.0–77.0)
Platelets: 287 10*3/uL (ref 150.0–400.0)
RBC: 4.16 Mil/uL (ref 3.87–5.11)
RDW: 14.4 % (ref 11.5–15.5)
WBC: 6.9 10*3/uL (ref 4.0–10.5)

## 2022-03-27 LAB — HEPATIC FUNCTION PANEL
ALT: 10 U/L (ref 0–35)
AST: 19 U/L (ref 0–37)
Albumin: 4 g/dL (ref 3.5–5.2)
Alkaline Phosphatase: 60 U/L (ref 39–117)
Bilirubin, Direct: 0.1 mg/dL (ref 0.0–0.3)
Total Bilirubin: 0.6 mg/dL (ref 0.2–1.2)
Total Protein: 8.5 g/dL — ABNORMAL HIGH (ref 6.0–8.3)

## 2022-03-27 LAB — BASIC METABOLIC PANEL
BUN: 13 mg/dL (ref 6–23)
CO2: 28 mEq/L (ref 19–32)
Calcium: 10.1 mg/dL (ref 8.4–10.5)
Chloride: 98 mEq/L (ref 96–112)
Creatinine, Ser: 1.21 mg/dL — ABNORMAL HIGH (ref 0.40–1.20)
GFR: 42.51 mL/min — ABNORMAL LOW (ref >60.00)
Glucose, Bld: 68 mg/dL — ABNORMAL LOW (ref 70–99)
Potassium: 4.3 mEq/L (ref 3.5–5.1)
Sodium: 131 mEq/L — ABNORMAL LOW (ref 135–145)

## 2022-03-27 LAB — BRAIN NATRIURETIC PEPTIDE: Pro B Natriuretic peptide (BNP): 32 pg/mL (ref 0.0–100.0)

## 2022-03-28 LAB — QUANTIFERON-TB GOLD PLUS
Mitogen-NIL: 7.95 IU/mL
NIL: 0.02 IU/mL
QuantiFERON-TB Gold Plus: NEGATIVE
TB1-NIL: 0 IU/mL
TB2-NIL: 0 IU/mL

## 2022-03-30 ENCOUNTER — Telehealth: Payer: Self-pay | Admitting: Internal Medicine

## 2022-03-30 NOTE — Telephone Encounter (Signed)
  Creat - slightly high but baseline -> she should talk to PCP Carol Ada, MD and see if sake to still take lisinopril  Na - low. Not on diuretic. Can make ppl a bit tired/dizzy. Should talk to PCP Carol Ada, MD  Other labs normal  LABS    PULMONARY No results for input(s): "PHART", "PCO2ART", "PO2ART", "HCO3", "TCO2", "O2SAT" in the last 168 hours.  Invalid input(s): "PCO2", "PO2"  CBC Recent Labs  Lab 03/26/22 1525  HGB 12.6  HCT 37.4  WBC 6.9  PLT 287.0    COAGULATION No results for input(s): "INR" in the last 168 hours.  CARDIAC  No results for input(s): "TROPONINI" in the last 168 hours. Recent Labs  Lab 03/26/22 1525  PROBNP 32.0     CHEMISTRY Recent Labs  Lab 03/26/22 1525  NA 131*  K 4.3  CL 98  CO2 28  GLUCOSE 68*  BUN 13  CREATININE 1.21*  CALCIUM 10.1   Estimated Creatinine Clearance: 36.7 mL/min (A) (by C-G formula based on SCr of 1.21 mg/dL (H)).   LIVER Recent Labs  Lab 03/26/22 1525  AST 19  ALT 10  ALKPHOS 60  BILITOT 0.6  PROT 8.5*  ALBUMIN 4.0     INFECTIOUS No results for input(s): "LATICACIDVEN", "PROCALCITON" in the last 168 hours.   ENDOCRINE CBG (last 3)  No results for input(s): "GLUCAP" in the last 72 hours.       IMAGING x48h  - image(s) personally visualized  -   highlighted in bold No results found.

## 2022-03-30 NOTE — Telephone Encounter (Signed)
Attempted to call pt but unable to reach. Left message to return call.  

## 2022-04-02 ENCOUNTER — Telehealth: Payer: Self-pay | Admitting: Internal Medicine

## 2022-04-02 DIAGNOSIS — M359 Systemic involvement of connective tissue, unspecified: Secondary | ICD-10-CM

## 2022-04-02 DIAGNOSIS — G4734 Idiopathic sleep related nonobstructive alveolar hypoventilation: Secondary | ICD-10-CM

## 2022-04-02 NOTE — Telephone Encounter (Signed)
Overnight pulse oximetry done on 03/30/2022 shows pulse ox less than equal to 88% for 5 minutes and 3 seconds  Plan - It is precisely at this point patient recommended to have nighttime oxygen but if she wants to hold off I am okay with that but we will retest that as time goes on.  If she wants nighttime oxygen we can start with 1 L nasal cannula

## 2022-04-03 ENCOUNTER — Other Ambulatory Visit: Payer: Medicare Other

## 2022-04-03 NOTE — Telephone Encounter (Signed)
Called and spoke with pt letting her know the results of labwork and she verbalized understanding. Nothing further needed. 

## 2022-04-03 NOTE — Telephone Encounter (Signed)
Called and spoke with pt letting her know the results of the ONO and she verbalized understanding stating she would be okay starting on O2 at night. Order placed.nothing further needed.

## 2022-04-10 ENCOUNTER — Telehealth: Payer: Self-pay | Admitting: Internal Medicine

## 2022-04-10 NOTE — Telephone Encounter (Signed)
Wants to see if Dr. Chase Caller TAKING PT OFF lisinopril (ZESTRIL) 10 MG tablet [id number] due to dry cough Dr. Nicki Reaper  [phone]

## 2022-04-13 NOTE — Telephone Encounter (Signed)
PT ret our call, she says. Adv will have [place] ret call to her @ [phone]

## 2022-04-13 NOTE — Telephone Encounter (Signed)
Kathryn Males, MD  Note Expand All Collapse All   Creat - slightly high but baseline -> she should talk to PCP Carol Ada, MD and see if safe to still take lisinopril   Na - low. Not on diuretic. Can make ppl a bit tired/dizzy. Should talk to PCP Carol Ada, MD   Other labs normal          Called pt's PCP office and spoke with [name] letting her know the info per MR. [name] stated she would let [name] and Dr. Tamala Julian know this and then get back with [place] with what they say.

## 2022-04-14 NOTE — Telephone Encounter (Signed)
Called and left detailed message for patient about labs and to talk to PCP about lisinipril. Nothing further needed

## 2022-04-15 ENCOUNTER — Telehealth: Payer: Self-pay

## 2022-04-15 NOTE — Telephone Encounter (Signed)
Hey ladies,  Can we see the wait time for the echocardiogram for this patient. HRCT is scheduled for 04/28/22 and I was wondering about the echocardiogram.   Thank you

## 2022-04-16 ENCOUNTER — Ambulatory Visit: Payer: 59 | Admitting: Primary Care

## 2022-04-16 NOTE — Telephone Encounter (Signed)
Appt. Rescheduled with Dr. Chase Caller for 06-01-2022 to allow time for pt. To get HRCT and Echo. Test done. Pt. Agreed and made note on her calendar.

## 2022-04-16 NOTE — Telephone Encounter (Signed)
Echo is scheduled for 4/12

## 2022-04-16 NOTE — Telephone Encounter (Signed)
Closing encounter

## 2022-04-25 ENCOUNTER — Ambulatory Visit (INDEPENDENT_AMBULATORY_CARE_PROVIDER_SITE_OTHER): Payer: 59

## 2022-04-25 ENCOUNTER — Inpatient Hospital Stay (HOSPITAL_COMMUNITY)
Admission: EM | Admit: 2022-04-25 | Discharge: 2022-04-27 | DRG: 641 | Disposition: A | Discharge status: Home / Self Care | Payer: 59 | Attending: Internal Medicine | Admitting: Internal Medicine

## 2022-04-25 ENCOUNTER — Inpatient Hospital Stay (HOSPITAL_COMMUNITY): Payer: 59

## 2022-04-25 ENCOUNTER — Other Ambulatory Visit: Payer: Self-pay

## 2022-04-25 ENCOUNTER — Ambulatory Visit (INDEPENDENT_AMBULATORY_CARE_PROVIDER_SITE_OTHER)
Admission: EM | Admit: 2022-04-25 | Discharge: 2022-04-25 | Disposition: A | Discharge status: Home / Self Care | Payer: 59 | Source: Home / Self Care

## 2022-04-25 DIAGNOSIS — Z1152 Encounter for screening for COVID-19: Secondary | ICD-10-CM | POA: Diagnosis not present

## 2022-04-25 DIAGNOSIS — Z79890 Hormone replacement therapy (postmenopausal): Secondary | ICD-10-CM

## 2022-04-25 DIAGNOSIS — E871 Hypo-osmolality and hyponatremia: Secondary | ICD-10-CM | POA: Diagnosis present

## 2022-04-25 DIAGNOSIS — R55 Syncope and collapse: Secondary | ICD-10-CM | POA: Diagnosis present

## 2022-04-25 DIAGNOSIS — I480 Paroxysmal atrial fibrillation: Secondary | ICD-10-CM | POA: Diagnosis present

## 2022-04-25 DIAGNOSIS — R5383 Other fatigue: Secondary | ICD-10-CM

## 2022-04-25 DIAGNOSIS — N3001 Acute cystitis with hematuria: Secondary | ICD-10-CM | POA: Insufficient documentation

## 2022-04-25 DIAGNOSIS — Z888 Allergy status to other drugs, medicaments and biological substances status: Secondary | ICD-10-CM

## 2022-04-25 DIAGNOSIS — R636 Underweight: Secondary | ICD-10-CM | POA: Diagnosis present

## 2022-04-25 DIAGNOSIS — Z87891 Personal history of nicotine dependence: Secondary | ICD-10-CM | POA: Diagnosis not present

## 2022-04-25 DIAGNOSIS — Z7984 Long term (current) use of oral hypoglycemic drugs: Secondary | ICD-10-CM

## 2022-04-25 DIAGNOSIS — N1831 Chronic kidney disease, stage 3a: Secondary | ICD-10-CM | POA: Diagnosis present

## 2022-04-25 DIAGNOSIS — I959 Hypotension, unspecified: Secondary | ICD-10-CM | POA: Diagnosis present

## 2022-04-25 DIAGNOSIS — R5381 Other malaise: Secondary | ICD-10-CM | POA: Insufficient documentation

## 2022-04-25 DIAGNOSIS — Z881 Allergy status to other antibiotic agents status: Secondary | ICD-10-CM

## 2022-04-25 DIAGNOSIS — E860 Dehydration: Secondary | ICD-10-CM | POA: Diagnosis not present

## 2022-04-25 DIAGNOSIS — Z87440 Personal history of urinary (tract) infections: Secondary | ICD-10-CM

## 2022-04-25 DIAGNOSIS — N390 Urinary tract infection, site not specified: Secondary | ICD-10-CM | POA: Diagnosis not present

## 2022-04-25 DIAGNOSIS — R1084 Generalized abdominal pain: Secondary | ICD-10-CM | POA: Diagnosis present

## 2022-04-25 DIAGNOSIS — E861 Hypovolemia: Secondary | ICD-10-CM | POA: Diagnosis not present

## 2022-04-25 DIAGNOSIS — E876 Hypokalemia: Secondary | ICD-10-CM | POA: Diagnosis present

## 2022-04-25 DIAGNOSIS — B952 Enterococcus as the cause of diseases classified elsewhere: Secondary | ICD-10-CM | POA: Diagnosis present

## 2022-04-25 DIAGNOSIS — E1122 Type 2 diabetes mellitus with diabetic chronic kidney disease: Secondary | ICD-10-CM | POA: Diagnosis present

## 2022-04-25 DIAGNOSIS — J449 Chronic obstructive pulmonary disease, unspecified: Secondary | ICD-10-CM | POA: Diagnosis present

## 2022-04-25 DIAGNOSIS — Z6822 Body mass index (BMI) 22.0-22.9, adult: Secondary | ICD-10-CM | POA: Diagnosis not present

## 2022-04-25 DIAGNOSIS — E039 Hypothyroidism, unspecified: Secondary | ICD-10-CM | POA: Diagnosis present

## 2022-04-25 DIAGNOSIS — R0989 Other specified symptoms and signs involving the circulatory and respiratory systems: Secondary | ICD-10-CM | POA: Diagnosis present

## 2022-04-25 DIAGNOSIS — J849 Interstitial pulmonary disease, unspecified: Secondary | ICD-10-CM | POA: Diagnosis present

## 2022-04-25 DIAGNOSIS — R000 Tachycardia, unspecified: Secondary | ICD-10-CM | POA: Diagnosis present

## 2022-04-25 DIAGNOSIS — M329 Systemic lupus erythematosus, unspecified: Secondary | ICD-10-CM | POA: Diagnosis present

## 2022-04-25 DIAGNOSIS — Z853 Personal history of malignant neoplasm of breast: Secondary | ICD-10-CM

## 2022-04-25 DIAGNOSIS — I10 Essential (primary) hypertension: Secondary | ICD-10-CM | POA: Diagnosis not present

## 2022-04-25 DIAGNOSIS — R059 Cough, unspecified: Secondary | ICD-10-CM

## 2022-04-25 DIAGNOSIS — E1140 Type 2 diabetes mellitus with diabetic neuropathy, unspecified: Secondary | ICD-10-CM | POA: Diagnosis present

## 2022-04-25 DIAGNOSIS — I4892 Unspecified atrial flutter: Secondary | ICD-10-CM | POA: Diagnosis present

## 2022-04-25 DIAGNOSIS — J439 Emphysema, unspecified: Secondary | ICD-10-CM | POA: Diagnosis not present

## 2022-04-25 DIAGNOSIS — R509 Fever, unspecified: Secondary | ICD-10-CM | POA: Diagnosis not present

## 2022-04-25 DIAGNOSIS — I129 Hypertensive chronic kidney disease with stage 1 through stage 4 chronic kidney disease, or unspecified chronic kidney disease: Secondary | ICD-10-CM | POA: Diagnosis present

## 2022-04-25 DIAGNOSIS — R197 Diarrhea, unspecified: Secondary | ICD-10-CM | POA: Diagnosis present

## 2022-04-25 DIAGNOSIS — K5900 Constipation, unspecified: Secondary | ICD-10-CM | POA: Diagnosis present

## 2022-04-25 DIAGNOSIS — Z79899 Other long term (current) drug therapy: Secondary | ICD-10-CM

## 2022-04-25 DIAGNOSIS — Z825 Family history of asthma and other chronic lower respiratory diseases: Secondary | ICD-10-CM

## 2022-04-25 LAB — CBC
HCT: 32.8 % — ABNORMAL LOW (ref 36.0–46.0)
HCT: 31.3 % — ABNORMAL LOW (ref 36.0–46.0)
Hemoglobin: 11.3 g/dL — ABNORMAL LOW (ref 12.0–15.0)
Hemoglobin: 10.7 g/dL — ABNORMAL LOW (ref 12.0–15.0)
MCH: 29.7 pg (ref 26.0–34.0)
MCH: 29.7 pg (ref 26.0–34.0)
MCHC: 34.5 g/dL (ref 30.0–36.0)
MCHC: 34.2 g/dL (ref 30.0–36.0)
MCV: 86.1 fL (ref 80.0–100.0)
MCV: 86.9 fL (ref 80.0–100.0)
Platelets: 197 10*3/uL (ref 150–400)
Platelets: 207 10*3/uL (ref 150–400)
RBC: 3.81 MIL/uL — ABNORMAL LOW (ref 3.87–5.11)
RBC: 3.6 MIL/uL — ABNORMAL LOW (ref 3.87–5.11)
RDW: 13.3 % (ref 11.5–15.5)
RDW: 13.2 % (ref 11.5–15.5)
WBC: 6.4 10*3/uL (ref 4.0–10.5)
WBC: 5.3 10*3/uL (ref 4.0–10.5)
nRBC: 0 % (ref 0.0–0.2)
nRBC: 0 % (ref 0.0–0.2)

## 2022-04-25 LAB — BASIC METABOLIC PANEL
Anion gap: 10 (ref 5–15)
Anion gap: 9 (ref 5–15)
BUN: 13 mg/dL (ref 8–23)
BUN: 11 mg/dL (ref 8–23)
CO2: 19 mmol/L — ABNORMAL LOW (ref 22–32)
CO2: 19 mmol/L — ABNORMAL LOW (ref 22–32)
Calcium: 8.6 mg/dL — ABNORMAL LOW (ref 8.9–10.3)
Calcium: 8.2 mg/dL — ABNORMAL LOW (ref 8.9–10.3)
Chloride: 97 mmol/L — ABNORMAL LOW (ref 98–111)
Chloride: 101 mmol/L (ref 98–111)
Creatinine, Ser: 1.24 mg/dL — ABNORMAL HIGH (ref 0.44–1.00)
Creatinine, Ser: 1.02 mg/dL — ABNORMAL HIGH (ref 0.44–1.00)
GFR, Estimated: 44 mL/min — ABNORMAL LOW (ref >60)
GFR, Estimated: 56 mL/min — ABNORMAL LOW (ref >60)
Glucose, Bld: 80 mg/dL (ref 70–99)
Glucose, Bld: 73 mg/dL (ref 70–99)
Potassium: 3.4 mmol/L — ABNORMAL LOW (ref 3.5–5.1)
Potassium: 3.4 mmol/L — ABNORMAL LOW (ref 3.5–5.1)
Sodium: 126 mmol/L — ABNORMAL LOW (ref 135–145)
Sodium: 129 mmol/L — ABNORMAL LOW (ref 135–145)

## 2022-04-25 LAB — POCT URINALYSIS DIP (MANUAL ENTRY)
Bilirubin, UA: NEGATIVE
Glucose, UA: NEGATIVE mg/dL
Ketones, POC UA: NEGATIVE mg/dL
Nitrite, UA: NEGATIVE
Protein Ur, POC: NEGATIVE mg/dL
Spec Grav, UA: 1.01 (ref 1.010–1.025)
Urobilinogen, UA: 1 E.U./dL
pH, UA: 6 (ref 5.0–8.0)

## 2022-04-25 LAB — HEPATIC FUNCTION PANEL
ALT: 12 U/L (ref 0–44)
AST: 20 U/L (ref 15–41)
Albumin: 3.2 g/dL — ABNORMAL LOW (ref 3.5–5.0)
Alkaline Phosphatase: 46 U/L (ref 38–126)
Bilirubin, Direct: 0.1 mg/dL (ref 0.0–0.2)
Indirect Bilirubin: 0.6 mg/dL (ref 0.3–0.9)
Total Bilirubin: 0.7 mg/dL (ref 0.3–1.2)
Total Protein: 8 g/dL (ref 6.5–8.1)

## 2022-04-25 LAB — TSH
TSH: 3.262 u[IU]/mL (ref 0.350–4.500)
TSH: 3.005 u[IU]/mL (ref 0.350–4.500)

## 2022-04-25 LAB — URINALYSIS, ROUTINE W REFLEX MICROSCOPIC
Bilirubin Urine: NEGATIVE
Glucose, UA: NEGATIVE mg/dL
Hgb urine dipstick: NEGATIVE
Ketones, ur: NEGATIVE mg/dL
Nitrite: NEGATIVE
Protein, ur: NEGATIVE mg/dL
Specific Gravity, Urine: 1.005 (ref 1.005–1.030)
pH: 6 (ref 5.0–8.0)

## 2022-04-25 LAB — PROTIME-INR
INR: 1.2 (ref 0.8–1.2)
Prothrombin Time: 14.8 seconds (ref 11.4–15.2)

## 2022-04-25 LAB — LACTIC ACID, PLASMA: Lactic Acid, Venous: 0.8 mmol/L (ref 0.5–1.9)

## 2022-04-25 LAB — MAGNESIUM: Magnesium: 2 mg/dL (ref 1.7–2.4)

## 2022-04-25 LAB — TROPONIN I (HIGH SENSITIVITY)
Troponin I (High Sensitivity): 9 ng/L (ref <18)
Troponin I (High Sensitivity): 8 ng/L (ref <18)

## 2022-04-25 LAB — LIPASE, BLOOD: Lipase: 34 U/L (ref 11–51)

## 2022-04-25 LAB — CBG MONITORING, ED: Glucose-Capillary: 78 mg/dL (ref 70–99)

## 2022-04-25 LAB — SARS CORONAVIRUS 2 BY RT PCR: SARS Coronavirus 2 by RT PCR: NEGATIVE

## 2022-04-25 LAB — D-DIMER, QUANTITATIVE: D-Dimer, Quant: 1.33 ug/mL-FEU — ABNORMAL HIGH (ref 0.00–0.50)

## 2022-04-25 MED ORDER — ACETAMINOPHEN 650 MG RE SUPP: 650.0000 mg | Freq: Four times a day (QID) | RECTAL | Status: DC | PRN

## 2022-04-25 MED ORDER — CEPHALEXIN 500 MG PO CAPS: 500.0000 mg | ORAL_CAPSULE | Freq: Two times a day (BID) | ORAL | 0 refills | Status: DC

## 2022-04-25 MED ORDER — ACETAMINOPHEN 325 MG PO TABS: 650.0000 mg | ORAL_TABLET | Freq: Four times a day (QID) | ORAL | Status: DC | PRN

## 2022-04-25 MED ORDER — ONDANSETRON HCL 4 MG/2ML IJ SOLN: 4.0000 mg | Freq: Four times a day (QID) | INTRAMUSCULAR | Status: DC | PRN

## 2022-04-25 MED ORDER — SODIUM CHLORIDE 0.9 % IV SOLN
1.0000 g | INTRAVENOUS | Status: DC
  Administered 2022-04-26: 1 g via INTRAVENOUS
  Filled 2022-04-25: qty 10

## 2022-04-25 MED ORDER — SODIUM CHLORIDE 0.9 % IV SOLN: INTRAVENOUS | Status: DC

## 2022-04-25 MED ORDER — ONDANSETRON HCL 4 MG PO TABS: 4.0000 mg | ORAL_TABLET | Freq: Four times a day (QID) | ORAL | Status: DC | PRN

## 2022-04-25 MED ORDER — LEVALBUTEROL HCL 0.63 MG/3ML IN NEBU: 0.6300 mg | INHALATION_SOLUTION | Freq: Four times a day (QID) | RESPIRATORY_TRACT | Status: DC | PRN

## 2022-04-25 MED ORDER — SODIUM CHLORIDE 0.9 % IV SOLN: Freq: Once | INTRAVENOUS | Status: AC

## 2022-04-25 MED ORDER — HEPARIN SODIUM (PORCINE) 5000 UNIT/ML IJ SOLN
5000.0000 [IU] | Freq: Three times a day (TID) | INTRAMUSCULAR | Status: DC
  Administered 2022-04-26 – 2022-04-27 (×4): 5000 [IU] via SUBCUTANEOUS
  Filled 2022-04-25 (×4): qty 1

## 2022-04-25 MED ORDER — SODIUM CHLORIDE 0.9 % IV SOLN
1.0000 g | Freq: Once | INTRAVENOUS | Status: AC
  Administered 2022-04-25: 1 g via INTRAVENOUS
  Filled 2022-04-25: qty 10

## 2022-04-25 MED ORDER — SODIUM CHLORIDE 0.9 % IV BOLUS
500.0000 mL | Freq: Once | INTRAVENOUS | Status: AC
  Administered 2022-04-25: 500 mL via INTRAVENOUS

## 2022-04-25 NOTE — Discharge Instructions (Addendum)
We are going to treat you for a urinary tract infection.  Please start cephalexin twice daily for 7 days.  We will contact you if we need to arrange any additional treatment based on your culture result.  I will contact you with your lab work.  Your chest x-ray was stable and consistent with your history of interstitial lung disease.  Continue your medications as prescribed by her pulmonologist.  Make sure that you rest and drink plenty of fluid.  Follow-up with your primary care next week.  If at any point you have worsening symptoms including shortness of breath, chest pain, nausea/vomiting, weakness, severe abdominal pain you need to go to the emergency room.

## 2022-04-25 NOTE — ED Notes (Signed)
ED TO INPATIENT HANDOFF REPORT  ED Nurse Name and Phone #: Newman Pies [phone]  S Name/Age/Gender [name] 82 y.o. female Room/Bed: 035C/035C  Code Status   Code Status: Full Code  Home/SNF/Other Home Patient oriented to: self, place, time, and situation Is this baseline? Yes   Triage Complete: Triage complete  Chief Complaint Syncope [R55]  Triage Note [facility] bib [facility] from [facility] after a syncopal episode she was feeling lightheaded and dizzy. Initial BP was 90/60 and HR 130 . [facility] states She went to urgent care this morning and was diagnosed with a UTI. [facility] stated they gave 500 NS and her heart rate went up to 150 and ECG showed A-fiib rvr. [facility] states that she is fluctuating between NSR and AFIB.   Urgent Care prescribed antibiotics but she has not picked them up from pharmacy yet.   HX: A-fib, valve repair    Allergies Allergies  Allergen Reactions   Ciprofloxacin Itching, Swelling and Other (See Comments)    Possibly causing tremors?   Crestor [Rosuvastatin] Other (See Comments)    Myalgia and back pain   Ofev [Nintedanib] Nausea Only    Level of Care/Admitting Diagnosis ED Disposition     ED Disposition  Admit   Condition  --   [place]: [place] [[id number]]  Level of Care: Progressive [102]  Admit to Progressive based on following criteria: CARDIOVASCULAR & THORACIC of moderate stability with acute coronary syndrome symptoms/low risk myocardial infarction/hypertensive urgency/arrhythmias/heart failure potentially compromising stability and stable post cardiovascular intervention patients.  May admit patient to [facility] or [facility] if equivalent level of care is available:: No  Covid Evaluation: Confirmed COVID Negative  Diagnosis: Syncope [206001]  Admitting Physician: Clance Boll [id number]  Attending Physician: Clance Boll [id number]  Certification:: I certify this patient will need  inpatient services for at least 2 midnights  Estimated Length of Stay: 3          B Medical/Surgery History Past Medical History:  Diagnosis Date   Breast cancer ([place])    Diabetes mellitus without complication ([place])    Hypertension    Hypothyroidism    Lupus ([place])    Past Surgical History:  Procedure Laterality Date   BREAST LUMPECTOMY  2009   IR 3D INDEPENDENT WKST  05/29/2021   IR ANGIO INTRA EXTRACRAN SEL COM CAROTID INNOMINATE BILAT MOD SED  05/29/2021   IR ANGIO VERTEBRAL SEL VERTEBRAL BILAT MOD SED  05/29/2021     A IV Location/Drains/Wounds Patient Lines/Drains/Airways Status     Active Line/Drains/Airways     Name Placement date Placement time Site Days   Peripheral IV 04/25/22 20 G Right Forearm 04/25/22  1601  Forearm  less than 1   Wound / Incision (Open or Dehisced) 05/29/21 Puncture Groin Anterior;Proximal;Right Arterial access puncture site 05/29/21  1120  Groin  331            Intake/Output Last 24 hours  Intake/Output Summary (Last 24 hours) at 04/25/2022 2218 Last data filed at 04/25/2022 2214 Gross per 24 hour  Intake 1100 ml  Output --  Net 1100 ml    Labs/Imaging Results for orders placed or performed during the hospital encounter of 04/25/22 (from the past 48 hour(s))  CBG monitoring, ED     Status: None   Collection Time: 04/25/22  4:58 PM  Result Value Ref Range   Glucose-Capillary 78 70 - 99 mg/dL    Comment: Glucose reference range applies  only to samples taken after fasting for at least 8 hours.  TSH     Status: None   Collection Time: 04/25/22  5:02 PM  Result Value Ref Range   TSH 3.262 0.350 - 4.500 uIU/mL    Comment: Performed by a 3rd Generation assay with a functional sensitivity of <=0.01 uIU/mL. Performed at [facility], [place] [address] [place], [place] [address]   SARS Coronavirus 2 by RT PCR (hospital order, performed in [facility] hospital lab) *cepheid single result test* Anterior Nasal Swab     Status: None    Collection Time: 04/25/22  5:02 PM   Specimen: Anterior Nasal Swab  Result Value Ref Range   SARS Coronavirus 2 by RT PCR NEGATIVE NEGATIVE    Comment: Performed at [facility], [place] [address], [place], [place] [id number]  Basic metabolic panel     Status: Abnormal   Collection Time: 04/25/22  5:04 PM  Result Value Ref Range   Sodium 126 (L) 135 - 145 mmol/L   Potassium 3.4 (L) 3.5 - 5.1 mmol/L   Chloride 97 (L) 98 - 111 mmol/L   CO2 19 (L) 22 - 32 mmol/L   Glucose, Bld 80 70 - 99 mg/dL    Comment: Glucose reference range applies only to samples taken after fasting for at least 8 hours.   BUN 13 8 - 23 mg/dL   Creatinine, Ser 1.24 (H) 0.44 - 1.00 mg/dL   Calcium 8.6 (L) 8.9 - 10.3 mg/dL   GFR, Estimated 44 (L) >60 mL/min    Comment: (NOTE) Calculated using the CKD-EPI Creatinine Equation (2021)    Anion gap 10 5 - 15    Comment: Performed at [facility] [address], [place], [place] [address]  CBC     Status: Abnormal   Collection Time: 04/25/22  5:04 PM  Result Value Ref Range   WBC 6.4 4.0 - 10.5 K/uL   RBC 3.81 (L) 3.87 - 5.11 MIL/uL   Hemoglobin 11.3 (L) 12.0 - 15.0 g/dL   HCT 32.8 (L) 36.0 - 46.0 %   MCV 86.1 80.0 - 100.0 fL   MCH 29.7 26.0 - 34.0 pg   MCHC 34.5 30.0 - 36.0 g/dL   RDW 13.3 11.5 - 15.5 %   Platelets 197 150 - 400 K/uL   nRBC 0.0 0.0 - 0.2 %    Comment: Performed at [facility], [place] [address] [place], [place] [address]  Hepatic function panel     Status: Abnormal   Collection Time: 04/25/22  5:04 PM  Result Value Ref Range   Total Protein 8.0 6.5 - 8.1 g/dL   Albumin 3.2 (L) 3.5 - 5.0 g/dL   AST 20 15 - 41 U/L   ALT 12 0 - 44 U/L   Alkaline Phosphatase 46 38 - 126 U/L   Total Bilirubin 0.7 0.3 - 1.2 mg/dL   Bilirubin, Direct 0.1 0.0 - 0.2 mg/dL   Indirect Bilirubin 0.6 0.3 - 0.9 mg/dL    Comment: Performed at [facility] [address], [place], [place] [address]  Lipase, blood     Status: None   Collection Time:  04/25/22  5:04 PM  Result Value Ref Range   Lipase 34 11 - 51 U/L    Comment: Performed at [facility] [address] [place], [place] [address]  Troponin I (High Sensitivity)     Status: None   Collection Time:  04/25/22  5:04 PM  Result Value Ref Range   Troponin I (High Sensitivity) 9 <18 ng/L    Comment: (NOTE) Elevated high sensitivity troponin I (hsTnI) values and significant  changes across serial measurements may suggest ACS but many other  chronic and acute conditions are known to elevate hsTnI results.  Refer to the "Links" section for chest pain algorithms and additional  guidance. Performed at [facility], [place] [address], [place], [place] [address]   Magnesium     Status: None   Collection Time: 04/25/22  5:04 PM  Result Value Ref Range   Magnesium 2.0 1.7 - 2.4 mg/dL    Comment: Performed at [facility], [place] [address], [place], [place] [address]  Protime-INR     Status: None   Collection Time: 04/25/22  5:04 PM  Result Value Ref Range   Prothrombin Time 14.8 11.4 - 15.2 seconds   INR 1.2 0.8 - 1.2    Comment: (NOTE) INR goal varies based on device and disease states. Performed at [facility], [place] [address] [place], [place] [address]   Troponin I (High Sensitivity)     Status: None   Collection Time: 04/25/22  7:42 PM  Result Value Ref Range   Troponin I (High Sensitivity) 8 <18 ng/L    Comment: (NOTE) Elevated high sensitivity troponin I (hsTnI) values and significant  changes across serial measurements may suggest ACS but many other  chronic and acute conditions are known to elevate hsTnI results.  Refer to the "Links" section for chest pain algorithms and additional  guidance. Performed at [facility], [place] [address], [place], [place] [address]   Urinalysis, Routine w reflex microscopic -Urine, Clean Catch     Status: Abnormal   Collection Time: 04/25/22  7:55 PM  Result Value Ref Range   Color, Urine YELLOW YELLOW    APPearance HAZY (A) CLEAR   Specific Gravity, Urine 1.005 1.005 - 1.030   pH 6.0 5.0 - 8.0   Glucose, UA NEGATIVE NEGATIVE mg/dL   Hgb urine dipstick NEGATIVE NEGATIVE   Bilirubin Urine NEGATIVE NEGATIVE   Ketones, ur NEGATIVE NEGATIVE mg/dL   Protein, ur NEGATIVE NEGATIVE mg/dL   Nitrite NEGATIVE NEGATIVE   Leukocytes,Ua MODERATE (A) NEGATIVE   RBC / HPF 0-5 0 - 5 RBC/hpf   WBC, UA 11-20 0 - 5 WBC/hpf   Bacteria, UA MANY (A) NONE SEEN   Squamous Epithelial / HPF 0-5 0 - 5 /HPF    Comment: Performed at [facility], [address] [place], [place] [address]   DG Chest 2 View  Result Date: 04/25/2022 CLINICAL DATA:  Worsening cough. Diarrhea and fatigue for 1 week. History of interstitial lung disease. EXAM: CHEST - 2 VIEW COMPARISON:  07/31/2021 FINDINGS: Lateral view degraded by patient arm position. Midline trachea. Normal heart size. Atherosclerosis in the transverse aorta. Diffuse interstitial thickening with more focal reticulation in the left-greater-than-right lower lobes, similar. Given differences in technique, no evidence of acute superimposed lobar consolidation. IMPRESSION: Basilar predominant reticulation, likely related to history of interstitial lung disease. Similar to 07/31/2021. No acute superimposed process. Aortic Atherosclerosis (ICD10-I70.0). Electronically Signed   By: [name] M.D.   On: 04/25/2022 12:10    Pending Labs Unresulted Labs (From admission, onward)     Start     Ordered   04/26/22 0500  CBC  Tomorrow morning,   R        04/25/22 2157  04/26/22 0500  Comprehensive metabolic panel  Tomorrow morning,   R        04/25/22 2157   04/25/22 [id number]  Basic metabolic panel  Once,   R        04/25/22 2157   04/25/22 2157  Hemoglobin A1c  Once,   R        04/25/22 2157   04/25/22 2157  TSH  Once,   R        04/25/22 2157   04/25/22 2152  CBC  (heparin)  Once,   R       Comments: Baseline for heparin therapy IF NOT ALREADY DRAWN.  Notify MD if PLT  < 100 K.    04/25/22 2157   04/25/22 2150  Sedimentation rate  Once,   R        04/25/22 2157   04/25/22 2150  Lactic acid, plasma  STAT Now then every 3 hours,   R (with STAT occurrences)      04/25/22 2157   04/25/22 2149  Procalcitonin  Once,   R       References:    Procalcitonin Lower Respiratory Tract Infection AND Sepsis Procalcitonin Algorithm   04/25/22 2157   04/25/22 2149  D-dimer, quantitative  ONCE - STAT,   STAT        04/25/22 2157            Vitals/Pain Today's Vitals   04/25/22 1930 04/25/22 2115 04/25/22 2133 04/25/22 2133  BP: 134/75  123/79   Pulse: 70 71 69   Resp: (!) 27 (!) 22 19   Temp:    98.4 F (36.9 C)  TempSrc:    Oral  SpO2: 98% 98% 97%   Weight:      Height:      PainSc:        Isolation Precautions Airborne and Contact precautions  Medications Medications  cefTRIAXone (ROCEPHIN) 1 g in sodium chloride 0.9 % 100 mL IVPB (has no administration in time range)  heparin injection 5,000 Units (has no administration in time range)  0.9 %  sodium chloride infusion (has no administration in time range)  acetaminophen (TYLENOL) tablet 650 mg (has no administration in time range)    Or  acetaminophen (TYLENOL) suppository 650 mg (has no administration in time range)  ondansetron (ZOFRAN) tablet 4 mg (has no administration in time range)    Or  ondansetron (ZOFRAN) injection 4 mg (has no administration in time range)  levalbuterol (XOPENEX) nebulizer solution 0.63 mg (has no administration in time range)  sodium chloride 0.9 % bolus 500 mL (0 mLs Intravenous Stopped 04/25/22 1944)  cefTRIAXone (ROCEPHIN) 1 g in sodium chloride 0.9 % 100 mL IVPB (0 g Intravenous Stopped 04/25/22 2214)  0.9 %  sodium chloride infusion ( Intravenous New Bag/Given 04/25/22 2132)    Mobility walks     Focused Assessments Cardiac Assessment Handoff:    No results found for: "CKTOTAL", "CKMB", "CKMBINDEX", "TROPONINI" No results found for: "DDIMER" Does the  Patient currently have chest pain? No    R Recommendations: See Admitting Provider Note  Report given to:   Additional Notes: Paroxysmal atrial fibrillation; near-syncope and weakness since earlier today. A/O x 4, ambulatory w standby assist. Continent b/b.

## 2022-04-25 NOTE — ED Provider Notes (Signed)
[facility]    CSN: [id number] Arrival date & time: 04/25/22  1017      History   Chief Complaint Chief Complaint  Patient presents with   Abdominal Pain   Fatigue   Diarrhea    HPI [name] is a 82 y.o. female.   Patient presents today with a 1 week history of general malaise.  She reports subjective fever but did not measure her temperature as well as decreased appetite.  She is concerned that she might have a UTI as she often gets these without significant symptoms though she has been experiencing some urinary frequency.  She also reports associated cough but has a history of interstitial lung disease with chronic cough that is increased from baseline.  She denies any recent antibiotics.  Denies any recent urogenital procedure or self-catheterization.  She is eating and drinking but has had a decreased appetite.  She reports a history of diabetes but states her blood sugars are adequately controlled and she does not take SGLT2 inhibitor.  Denies any known sick contacts.  Denies any chest pain, shortness of breath.  She does struggle with constipation but reports that she had a bowel movement yesterday that was small.  She is passing gas normally.  Reports mild generalized abdominal pain but denies any focal abdominal pain.    Past Medical History:  Diagnosis Date   Breast cancer ([place])    Diabetes mellitus without complication ([place])    Hypertension    Hypothyroidism    Lupus ([place])     Patient Active Problem List   Diagnosis Date Noted   Acute bronchitis 12/17/2021   Pneumonia 07/31/2021   Acute cystitis 02/20/2019   Near syncope 02/18/2019   Hyponatremia 02/18/2019   ILD (interstitial lung disease) ([place]) 07/30/2016   Dyspnea 07/16/2016   Bibasilar crackles 07/16/2016   History of systemic lupus erythematosus (SLE) ([place]) 07/16/2016   Cough 07/16/2016   Encounter for monitoring ACE-inhibitor therapy 07/16/2016   History of asthma 07/16/2016   Fever,  unspecified    Hypothyroidism 01/03/2015   HTN (hypertension) 01/03/2015   Drug-induced hypersensitivity reaction 01/03/2015   Lupus ([place]) 01/03/2015   Pyrexia 01/02/2015    Past Surgical History:  Procedure Laterality Date   BREAST LUMPECTOMY  2009   IR 3D INDEPENDENT WKST  05/29/2021   IR ANGIO INTRA EXTRACRAN SEL COM CAROTID INNOMINATE BILAT MOD SED  05/29/2021   IR ANGIO VERTEBRAL SEL VERTEBRAL BILAT MOD SED  05/29/2021    OB History   No obstetric history on file.      Home Medications    Prior to Admission medications   Medication Sig Start Date End Date Taking? Authorizing Provider  cephALEXin (KEFLEX) 500 MG capsule Take 1 capsule (500 mg total) by mouth 2 (two) times daily. 04/25/22  Yes Ahyana Skillin K, PA-C  albuterol (VENTOLIN HFA) 108 (90 Base) MCG/ACT inhaler Inhale 2 puffs into the lungs every 4 (four) hours as needed for wheezing or shortness of breath. 07/03/21   Maryjane Hurter, MD  diclofenac Sodium (VOLTAREN) 1 % GEL Apply 1 application. topically 4 (four) times daily as needed (pain).    [provider]  ferrous sulfate 325 (65 FE) MG tablet Take 325 mg by mouth daily. Patient not taking: Reported on 03/26/2022    [provider]  fluticasone (FLONASE) 50 MCG/ACT nasal spray Place 1 spray into both nostrils daily as needed for allergies or rhinitis. Patient not taking: Reported on 03/26/2022  [provider]  gabapentin (NEURONTIN) 300 MG capsule Take 300 mg by mouth every 8 (eight) hours.    [provider]  glimepiride (AMARYL) 1 MG tablet Take 1 mg by mouth daily with breakfast.    [provider]  hydroxychloroquine (PLAQUENIL) 200 MG tablet Take 1 tablet (200 mg total) by mouth daily. 01/06/15   Kelvin Cellar, MD  ibuprofen (ADVIL) 200 MG tablet Take 200 mg by mouth every 6 (six) hours as needed for moderate pain or headache.    [provider]  ipratropium-albuterol (DUONEB) 0.5-2.5 (3) MG/3ML SOLN Take  3 mLs by nebulization every 6 (six) hours as needed. 07/11/21   Maryjane Hurter, MD  levothyroxine (SYNTHROID) 100 MCG tablet Take 100 mcg by mouth daily. 02/05/21   [provider]  lisinopril (ZESTRIL) 10 MG tablet Take 10 mg by mouth daily.    [provider]  omeprazole (PRILOSEC) 20 MG capsule Take 20 mg by mouth daily before breakfast.    [provider]  OVER THE COUNTER MEDICATION Take 1 tablet by mouth daily as needed (allergies). Flonase headache & allergy relief Patient not taking: Reported on 03/26/2022    [provider]  Tiotropium Bromide Monohydrate (SPIRIVA RESPIMAT) 1.25 MCG/ACT AERS Inhale 2 puffs into the lungs daily. Patient not taking: Reported on 09/15/2019 08/17/19 11/29/19  Brand Males, MD    Family History Family History  Problem Relation Age of Onset   Cancer Father        ? type   Asthma Brother     Social History Social History   Tobacco Use   Smoking status: Never   Smokeless tobacco: Former    Types: Snuff    Quit date: 02/03/2015  Vaping Use   Vaping Use: Never used  Substance Use Topics   Alcohol use: No   Drug use: No     Allergies   Ciprofloxacin, Crestor [rosuvastatin], and Ofev [nintedanib]   Review of Systems Review of Systems  Constitutional:  Positive for activity change, appetite change, fatigue and fever (subjective).  HENT:  Negative for congestion and sore throat.   Respiratory:  Positive for cough. Negative for shortness of breath.   Cardiovascular:  Negative for chest pain.  Gastrointestinal:  Negative for abdominal pain, constipation, diarrhea, nausea and vomiting.  Genitourinary:  Positive for frequency. Negative for dysuria and urgency.     Physical Exam Triage Vital Signs ED Triage Vitals  Enc Vitals Group     BP 04/25/22 1127 101/67     Pulse Rate 04/25/22 1127 94     Resp 04/25/22 1127 17     Temp 04/25/22 1127 98.8 F (37.1 C)     Temp Source 04/25/22 1127 Oral     SpO2  04/25/22 1127 92 %     Weight --      Height --      Head Circumference --      Peak Flow --      Pain Score 04/25/22 1132 3     Pain Loc --      Pain Edu? --      Excl. in [place]? --    No data found.  Updated Vital Signs BP 101/67 (BP Location: Left Arm)   Pulse 94   Temp 98.8 F (37.1 C) (Oral)   Resp 17   SpO2 92%   Visual Acuity Right Eye Distance:   Left Eye Distance:   Bilateral Distance:    Right Eye Near:  Left Eye Near:    Bilateral Near:     Physical Exam Vitals reviewed.  Constitutional:      General: She is awake. She is not in acute distress.    Appearance: Normal appearance. She is well-developed. She is not ill-appearing.     Comments: Very pleasant female appears stated age in no acute distress sitting comfortably in exam room  HENT:     Head: Normocephalic and atraumatic.     Right Ear: Tympanic membrane, ear canal and external ear normal. Tympanic membrane is not erythematous or bulging.     Left Ear: Tympanic membrane, ear canal and external ear normal. Tympanic membrane is not erythematous or bulging.     Nose:     Right Sinus: No maxillary sinus tenderness or frontal sinus tenderness.     Left Sinus: No maxillary sinus tenderness or frontal sinus tenderness.     Mouth/Throat:     Pharynx: Uvula midline. No oropharyngeal exudate or posterior oropharyngeal erythema.  Cardiovascular:     Rate and Rhythm: Normal rate and regular rhythm.     Heart sounds: Normal heart sounds, S1 normal and S2 normal. No murmur heard. Pulmonary:     Effort: Pulmonary effort is normal.     Breath sounds: Examination of the right-lower field reveals rales. Examination of the left-lower field reveals rales. Rales present. No wheezing or rhonchi.  Psychiatric:        Behavior: Behavior is cooperative.      UC Treatments / Results  Labs (all labs ordered are listed, but only abnormal results are displayed) Labs Reviewed  POCT URINALYSIS DIP (MANUAL ENTRY) -  Abnormal; Notable for the following components:      Result Value   Clarity, UA cloudy (*)    Blood, UA trace-intact (*)    Leukocytes, UA Trace (*)    All other components within normal limits  URINE CULTURE  CBC WITH DIFFERENTIAL/PLATELET  COMPREHENSIVE METABOLIC PANEL    EKG   Radiology DG Chest 2 View  Result Date: 04/25/2022 CLINICAL DATA:  Worsening cough. Diarrhea and fatigue for 1 week. History of interstitial lung disease. EXAM: CHEST - 2 VIEW COMPARISON:  07/31/2021 FINDINGS: Lateral view degraded by patient arm position. Midline trachea. Normal heart size. Atherosclerosis in the transverse aorta. Diffuse interstitial thickening with more focal reticulation in the left-greater-than-right lower lobes, similar. Given differences in technique, no evidence of acute superimposed lobar consolidation. IMPRESSION: Basilar predominant reticulation, likely related to history of interstitial lung disease. Similar to 07/31/2021. No acute superimposed process. Aortic Atherosclerosis (ICD10-I70.0). Electronically Signed   By: [name] M.D.   On: 04/25/2022 12:10    Procedures Procedures (including critical care time)  Medications Ordered in UC Medications - No data to display  Initial Impression / Assessment and Plan / UC Course  I have reviewed the triage vital signs and the nursing notes.  Pertinent labs & imaging results that were available during my care of the patient were reviewed by me and considered in my medical decision making (see chart for details).     Patient is well-appearing, afebrile, nontoxic, nontachycardic.  Physical exam is reassuring today.  Chest x-ray was obtained given bilateral rails which showed stable changes consistent with history of interstitial lung disease without acute superimposed cardiopulmonary disease.  UA was obtained that showed hemoglobin and leukocytes.  Given her history of fever and recurrent urinary tract infections will cover with  cephalexin 500 mg twice daily for 7 days.  No indication for  dose adjustment based on metabolic panel from February 2024 with creatinine of 1.21 and calculated creatinine clearance of 39.68 mL/min.  Basic labs including CBC and CMP were obtained today and if she has significant leukocytosis or abnormal kidney function she would need to be seen immediately in the emergency room.  Recommended that she follow-up with her primary care next week for reevaluation and management.  If she has any worsening or changing symptoms including recurrent fever, general malaise, weakness, abdominal pain, nausea, vomiting, shortness of breath, chest pain she needs to be seen immediately.  Strict return precautions given.    Final Clinical Impressions(s) / UC Diagnoses   Final diagnoses:  Fever, unspecified  Acute cystitis with hematuria  Malaise and fatigue  Bilateral rales     Discharge Instructions      We are going to treat you for a urinary tract infection.  Please start cephalexin twice daily for 7 days.  We will contact you if we need to arrange any additional treatment based on your culture result.  I will contact you with your lab work.  Your chest x-ray was stable and consistent with your history of interstitial lung disease.  Continue your medications as prescribed by her pulmonologist.  Make sure that you rest and drink plenty of fluid.  Follow-up with your primary care next week.  If at any point you have worsening symptoms including shortness of breath, chest pain, nausea/vomiting, weakness, severe abdominal pain you need to go to the emergency room.     ED Prescriptions     Medication Sig Dispense Auth. Provider   cephALEXin (KEFLEX) 500 MG capsule Take 1 capsule (500 mg total) by mouth 2 (two) times daily. 14 capsule Cesareo Vickrey K, PA-C      PDMP not reviewed this encounter.   Terrilee Croak, PA-C 04/25/22 1231

## 2022-04-25 NOTE — H&P (Incomplete)
History and Physical    [name] [id number] DOB: 02-05-1940 DOA: 04/25/2022  PCP: Carol Ada, MD  Patient coming from: restaurant  I have personally briefly reviewed patient's old medical records in [place]  Chief Complaint: syncope  HPI: [name] is a 82 y.o. female with medical history significant of  ILD followed by pulmonary, BRCA, DMII, Hypertension , Hypothyroidism ,? Lupus/Libman-Sacks endocarditis 2021 , admission 2021 for syncope with concern for Atrial flutter in the field, who presents to ED  BIB EMS after having syncopal episode in restaurant. Per chart patient bp in field was 90/30 and hr was noted to 130 with strip noting afib with rvr. Patient was treated with 500 NS. Per EMS, strip fluctuated between Afib and sinus. OF note patient has history of feeling poorly over the last 2 days. Per family patient was noted to be lethargic weak , sleeping more with decrease intake. This am patient had a near syncope event and this prompted family to bring patient to UC. Patient on evaluation at [facility] was diagnosed with UTI and discharged on Keflex.   Patient after UC stopped at restaurant and had another episode of syncope/ near syncope. Per family patient has not had history of afib in the past. Currently on floor, patient notes she feels some what improved. Of note per RN patient did have some complaints of feeling weak and presyncopal on transition from stretcher to bed.  Patient however s/p ivfs was noted to have no further feelings of presyncope. Patient currently noted no n/v/d/f/c/ but does not mild cough, no chest pain no current sob.  ED Course:  Vitals: afeb, bp 101/67,  hr 94 , rr 17  sat 92% on ra  CXR: IMPRESSION: Basilar predominant reticulation, likely related to history of interstitial lung disease. Similar to 07/31/2021. Ua :+bacteria wbc + , mod LE EKG: Respiratory panel neg TSH 3.2 Wbc 6.4, hgb 11.3 (12.6), plt 197 NA 126, K 3.4, CL 97, bicarb 19   cr 1.24 ( at baseline) CE9  Tx :CTx Review of Systems: As per HPI otherwise 10 point review of systems negative.   Past Medical History:  Diagnosis Date   Breast cancer ([place])    Diabetes mellitus without complication ([place])    Hypertension    Hypothyroidism    Lupus ([place])     Past Surgical History:  Procedure Laterality Date   BREAST LUMPECTOMY  2009   IR 3D INDEPENDENT WKST  05/29/2021   IR ANGIO INTRA EXTRACRAN SEL COM CAROTID INNOMINATE BILAT MOD SED  05/29/2021   IR ANGIO VERTEBRAL SEL VERTEBRAL BILAT MOD SED  05/29/2021     reports that she has never smoked. She quit smokeless tobacco use about 7 years ago.  Her smokeless tobacco use included snuff. She reports that she does not drink alcohol and does not use drugs.  Allergies  Allergen Reactions   Ciprofloxacin Itching, Swelling and Other (See Comments)    Possibly causing tremors?   Crestor [Rosuvastatin] Other (See Comments)    Myalgia and back pain   Ofev [Nintedanib] Nausea Only    Family History  Problem Relation Age of Onset   Cancer Father        ? type   Asthma Brother     Prior to Admission medications   Medication Sig Start Date End Date Taking? Authorizing Provider  albuterol (VENTOLIN HFA) 108 (90 Base) MCG/ACT inhaler Inhale 2 puffs into the lungs every 4 (four) hours as needed for wheezing  or shortness of breath. 07/03/21   Maryjane Hurter, MD  cephALEXin (KEFLEX) 500 MG capsule Take 1 capsule (500 mg total) by mouth 2 (two) times daily. 04/25/22   Raspet, Derry Skill, PA-C  diclofenac Sodium (VOLTAREN) 1 % GEL Apply 1 application. topically 4 (four) times daily as needed (pain).    [provider]  ferrous sulfate 325 (65 FE) MG tablet Take 325 mg by mouth daily. Patient not taking: Reported on 03/26/2022    [provider]  fluticasone (FLONASE) 50 MCG/ACT nasal spray Place 1 spray into both nostrils daily as needed for allergies or rhinitis. Patient not taking: Reported on 03/26/2022     [provider]  gabapentin (NEURONTIN) 300 MG capsule Take 300 mg by mouth every 8 (eight) hours.    [provider]  glimepiride (AMARYL) 1 MG tablet Take 1 mg by mouth daily with breakfast.    [provider]  hydroxychloroquine (PLAQUENIL) 200 MG tablet Take 1 tablet (200 mg total) by mouth daily. 01/06/15   Kelvin Cellar, MD  ibuprofen (ADVIL) 200 MG tablet Take 200 mg by mouth every 6 (six) hours as needed for moderate pain or headache.    [provider]  ipratropium-albuterol (DUONEB) 0.5-2.5 (3) MG/3ML SOLN Take 3 mLs by nebulization every 6 (six) hours as needed. 07/11/21   Maryjane Hurter, MD  levothyroxine (SYNTHROID) 100 MCG tablet Take 100 mcg by mouth daily. 02/05/21   [provider]  lisinopril (ZESTRIL) 10 MG tablet Take 10 mg by mouth daily.    [provider]  omeprazole (PRILOSEC) 20 MG capsule Take 20 mg by mouth daily before breakfast.    [provider]  OVER THE COUNTER MEDICATION Take 1 tablet by mouth daily as needed (allergies). Flonase headache & allergy relief Patient not taking: Reported on 03/26/2022    [provider]  Tiotropium Bromide Monohydrate (SPIRIVA RESPIMAT) 1.25 MCG/ACT AERS Inhale 2 puffs into the lungs daily. Patient not taking: Reported on 09/15/2019 08/17/19 11/29/19  Brand Males, MD    Physical Exam: Vitals:   04/25/22 1633 04/25/22 1634 04/25/22 1830 04/25/22 1930  BP: 123/76  127/65 134/75  Pulse: 93  76 70  Resp: (!) 22  (!) 25 (!) 27  Temp: 98.4 F (36.9 C)     TempSrc: Oral     SpO2: 96%  98% 98%  Weight:  68.9 kg    Height:  5\' 7"  (1.702 m)      Constitutional: NAD, calm, comfortable Vitals:   04/25/22 1633 04/25/22 1634 04/25/22 1830 04/25/22 1930  BP: 123/76  127/65 134/75  Pulse: 93  76 70  Resp: (!) 22  (!) 25 (!) 27  Temp: 98.4 F (36.9 C)     TempSrc: Oral     SpO2: 96%  98% 98%  Weight:  68.9 kg    Height:  5\' 7"  (1.702 m)     Eyes:  PERRL, lids and conjunctivae normal ENMT: Mucous membranes are moist. Posterior pharynx clear of any exudate or lesions.Normal dentition.  Neck: normal, supple, no masses, no thyromegaly Respiratory: clear to auscultation bilaterally, no wheezing, no crackles. Normal respiratory effort. No accessory muscle use.  Cardiovascular: Regular rate and rhythm, no murmurs / rubs / gallops. No extremity edema. 2+ pedal pulses.  Abdomen: no tenderness, no masses palpated. No hepatosplenomegaly. Bowel sounds positive.  Musculoskeletal: no clubbing / cyanosis. No joint deformity upper and lower extremities. Good ROM, no contractures. Normal muscle tone.  Skin: no rashes,  lesions, ulcers. No induration Neurologic: CN 2-12 grossly intact. Sensation intact, Strength 5/5 in all 4.  Psychiatric: Normal judgment and insight. Alert and oriented x 3. Normal mood.    Labs on Admission: I have personally reviewed following labs and imaging studies  CBC: Recent Labs  Lab 04/25/22 1704  WBC 6.4  HGB 11.3*  HCT 32.8*  MCV 86.1  PLT [id number]   Basic Metabolic Panel: Recent Labs  Lab 04/25/22 1704  NA 126*  K 3.4*  CL 97*  CO2 19*  GLUCOSE 80  BUN 13  CREATININE 1.24*  CALCIUM 8.6*  MG 2.0   GFR: Estimated Creatinine Clearance: 35.8 mL/min (A) (by C-G formula based on SCr of 1.24 mg/dL (H)). Liver Function Tests: Recent Labs  Lab 04/25/22 1704  AST 20  ALT 12  ALKPHOS 46  BILITOT 0.7  PROT 8.0  ALBUMIN 3.2*   Recent Labs  Lab 04/25/22 1704  LIPASE 34   No results for input(s): "AMMONIA" in the last 168 hours. Coagulation Profile: Recent Labs  Lab 04/25/22 1704  INR 1.2   Cardiac Enzymes: No results for input(s): "CKTOTAL", "CKMB", "CKMBINDEX", "TROPONINI" in the last 168 hours. BNP (last 3 results) Recent Labs    07/03/21 1643 03/26/22 1525  PROBNP 148.0* 32.0   HbA1C: No results for input(s): "HGBA1C" in the last 72 hours. CBG: Recent Labs  Lab 04/25/22 1658  GLUCAP 78    Lipid Profile: No results for input(s): "CHOL", "HDL", "LDLCALC", "TRIG", "CHOLHDL", "LDLDIRECT" in the last 72 hours. Thyroid Function Tests: Recent Labs    04/25/22 1702  TSH 3.262   Anemia Panel: No results for input(s): "VITAMINB12", "FOLATE", "FERRITIN", "TIBC", "IRON", "RETICCTPCT" in the last 72 hours. Urine analysis:    Component Value Date/Time   COLORURINE YELLOW 04/25/2022 1955   APPEARANCEUR HAZY (A) 04/25/2022 1955   LABSPEC 1.005 04/25/2022 1955   PHURINE 6.0 04/25/2022 1955   GLUCOSEU NEGATIVE 04/25/2022 1955   HGBUR NEGATIVE 04/25/2022 1955   BILIRUBINUR NEGATIVE 04/25/2022 1955   BILIRUBINUR negative 04/25/2022 1221   [place] 04/25/2022 1955   PROTEINUR NEGATIVE 04/25/2022 1955   UROBILINOGEN 1.0 04/25/2022 1221   UROBILINOGEN 0.2 06/20/2014 1842   NITRITE NEGATIVE 04/25/2022 1955   LEUKOCYTESUR MODERATE (A) 04/25/2022 1955    Radiological Exams on Admission: DG Chest 2 View  Result Date: 04/25/2022 CLINICAL DATA:  Worsening cough. Diarrhea and fatigue for 1 week. History of interstitial lung disease. EXAM: CHEST - 2 VIEW COMPARISON:  07/31/2021 FINDINGS: Lateral view degraded by patient arm position. Midline trachea. Normal heart size. Atherosclerosis in the transverse aorta. Diffuse interstitial thickening with more focal reticulation in the left-greater-than-right lower lobes, similar. Given differences in technique, no evidence of acute superimposed lobar consolidation. IMPRESSION: Basilar predominant reticulation, likely related to history of interstitial lung disease. Similar to 07/31/2021. No acute superimposed process. Aortic Atherosclerosis (ICD10-I70.0). Electronically Signed   By: [name] M.D.   On: 04/25/2022 12:10    EKG: Independently reviewed. See above  Assessment/Plan  Syncope -prior hx of similar presentation 2021, followed by [facility]  -has had previous event monitor 07/05/21 no afib or aflutter noted   -insetting of UTI and dehydration  -syncope multifactorial cardiac arryhthmia/volume status/ uti  -patient on arrival to ED has remained in sinus with stable bp s/p ivfs  - to be complete will f/u with ECHO in am  -continue to monitor on tele  -to be complete check d-dimer/ctpa if + - CTH , neuro checks  -TSH,  cycle CE - PT/OT   New Onset Afib -noted in filed ,resolved prior to admission to Ed - no recurrence  -monitor on tele overnight  - check mag/ phos /ensure electrolyte stable  -echo pending  - may need repeat event monitor on discharge   UTI -check lactic inflammatory markers  -continue with CTX  -f/u on urine culture   Hypovolemic Hyponatremia  -monitor labs  -ivfs  - urine osmo, urine na   Hypokalemia  -replete prn   ILD -not symptomatic  -on spiriva -followed by pulmonary   Hx of BRCA -in remission   DMII -iss/fs  -hold oral hypoglycemic for now -f/u on a1c   Hypertension -holding bp meds currently due to relative hypotension and syncope  -resume as able    Hypothyroidism  -resume synthroid -check TSH    Lupus/autoimmune disease nos  - unclear diagnosis  - not on DMARD  - followed with rheumatology     DVT prophylaxis: heparin Code Status: full/ as discussed per patient wishes in event of cardiac arrest  Family Communication:  Disposition Plan: patient  expected to be admitted greater than 2 midnights  Consults called: n/a Admission status: progressive    Clance Boll MD [facility]   If 7PM-7AM, please contact night-coverage [email or web address] Password [email or web address]  04/25/2022, 9:08 PM

## 2022-04-25 NOTE — ED Provider Notes (Signed)
Emergency Department Provider Note   I have reviewed the triage vital signs and the nursing notes.   HISTORY  Chief Complaint Near Syncope   HPI [name] is a 82 y.o. female past history of lupus, hypothyroid, hypertension, diabetes presents the emergency department with generalized weakness and near syncope today.  She not been feeling well for 2 days with poor appetite and fatigue.  She went to urgent care where she was diagnosed with a urinary tract infection with Keflex called to the pharmacy but not had a chance to pick it up because when she returned home she felt suddenly lightheaded with standing to the point of nearly passing out.  She called EMS who arrived to find her with a blood pressure in the 90s and tachycardic to the 130-150 range.  They report the patient going in and out of sinus rhythm and A-fib with RVR.  No documented history of patient having A-fib in the past.   Past Medical History:  Diagnosis Date   Breast cancer (HCC)    Diabetes mellitus without complication (HCC)    Hypertension    Hypothyroidism    Lupus (HCC)     Review of Systems  Constitutional: No fever/chills Cardiovascular: Denies chest pain. Positive near syncope.  Respiratory: Denies shortness of breath. Gastrointestinal: No abdominal pain.  No nausea, no vomiting. Musculoskeletal: Negative for back pain. Skin: Negative for rash. Neurological: Negative for headaches.  ____________________________________________   PHYSICAL EXAM:  VITAL SIGNS: ED Triage Vitals  Enc Vitals Group     BP 04/25/22 1633 123/76     Pulse Rate 04/25/22 1633 93     Resp 04/25/22 1633 (!) 22     Temp 04/25/22 1633 98.4 F (36.9 C)     Temp Source 04/25/22 1633 Oral     SpO2 04/25/22 1630 96 %     Weight 04/25/22 1634 152 lb (68.9 kg)     Height 04/25/22 1634 5\' 7"  (1.702 m)   Constitutional: Alert and oriented. Well appearing and in no acute distress. Eyes: Conjunctivae are normal.  Head:  Atraumatic. Nose: No congestion/rhinnorhea. Mouth/Throat: Mucous membranes are moist.  Neck: No stridor.   Cardiovascular: Normal rate, regular rhythm. Good peripheral circulation. Grossly normal heart sounds.   Respiratory: Normal respiratory effort.  No retractions. Lungs CTAB. Gastrointestinal: Soft and nontender. No distention.  Musculoskeletal: No lower extremity tenderness nor edema. No gross deformities of extremities. Neurologic:  Normal speech and language. No gross focal neurologic deficits are appreciated.  Skin:  Skin is warm, dry and intact. No rash noted.  ____________________________________________   LABS (all labs ordered are listed, but only abnormal results are displayed)  Labs Reviewed  BASIC METABOLIC PANEL - Abnormal; Notable for the following components:      Result Value   Sodium 126 (*)    Potassium 3.4 (*)    Chloride 97 (*)    CO2 19 (*)    Creatinine, Ser 1.24 (*)    Calcium 8.6 (*)    GFR, Estimated 44 (*)    All other components within normal limits  CBC - Abnormal; Notable for the following components:   RBC 3.81 (*)    Hemoglobin 11.3 (*)    HCT 32.8 (*)    All other components within normal limits  URINALYSIS, ROUTINE W REFLEX MICROSCOPIC - Abnormal; Notable for the following components:   APPearance HAZY (*)    Leukocytes,Ua MODERATE (*)    Bacteria, UA MANY (*)    All  other components within normal limits  HEPATIC FUNCTION PANEL - Abnormal; Notable for the following components:   Albumin 3.2 (*)    All other components within normal limits  D-DIMER, QUANTITATIVE - Abnormal; Notable for the following components:   D-Dimer, Quant 1.33 (*)    All other components within normal limits  SEDIMENTATION RATE - Abnormal; Notable for the following components:   Sed Rate 40 (*)    All other components within normal limits  CBC - Abnormal; Notable for the following components:   RBC 3.60 (*)    Hemoglobin 10.7 (*)    HCT 31.3 (*)    All other  components within normal limits  BASIC METABOLIC PANEL - Abnormal; Notable for the following components:   Sodium 129 (*)    Potassium 3.4 (*)    CO2 19 (*)    Creatinine, Ser 1.02 (*)    Calcium 8.2 (*)    GFR, Estimated 56 (*)    All other components within normal limits  HEMOGLOBIN A1C - Abnormal; Notable for the following components:   Hgb A1c MFr Bld 6.7 (*)    All other components within normal limits  CBC - Abnormal; Notable for the following components:   RBC 3.73 (*)    Hemoglobin 10.8 (*)    HCT 33.1 (*)    All other components within normal limits  COMPREHENSIVE METABOLIC PANEL - Abnormal; Notable for the following components:   Sodium 131 (*)    Creatinine, Ser 1.10 (*)    Calcium 8.6 (*)    Albumin 3.0 (*)    GFR, Estimated 51 (*)    All other components within normal limits  GLUCOSE, CAPILLARY - Abnormal; Notable for the following components:   Glucose-Capillary 113 (*)    All other components within normal limits  GLUCOSE, CAPILLARY - Abnormal; Notable for the following components:   Glucose-Capillary 112 (*)    All other components within normal limits  SARS CORONAVIRUS 2 BY RT PCR  LIPASE, BLOOD  TSH  MAGNESIUM  PROTIME-INR  PROCALCITONIN  LACTIC ACID, PLASMA  LACTIC ACID, PLASMA  TSH  GLUCOSE, CAPILLARY  GLUCOSE, CAPILLARY  CBG MONITORING, ED  TROPONIN I (HIGH SENSITIVITY)  TROPONIN I (HIGH SENSITIVITY)   ____________________________________________  EKG  Rate: 92 PR: 184 QTc: 449  Sinus rhythm. Narrow QRS. No ST elevation or depression.  ____________________________________________  RADIOLOGY  DG Chest 2 View  Result Date: 04/25/2022 CLINICAL DATA:  Worsening cough. Diarrhea and fatigue for 1 week. History of interstitial lung disease. EXAM: CHEST - 2 VIEW COMPARISON:  07/31/2021 FINDINGS: Lateral view degraded by patient arm position. Midline trachea. Normal heart size. Atherosclerosis in the transverse aorta. Diffuse interstitial  thickening with more focal reticulation in the left-greater-than-right lower lobes, similar. Given differences in technique, no evidence of acute superimposed lobar consolidation. IMPRESSION: Basilar predominant reticulation, likely related to history of interstitial lung disease. Similar to 07/31/2021. No acute superimposed process. Aortic Atherosclerosis (ICD10-I70.0). Electronically Signed   By: Jeronimo Greaves M.D.   On: 04/25/2022 12:10    ____________________________________________   PROCEDURES  Procedure(s) performed:   Procedures  None  ____________________________________________   INITIAL IMPRESSION / ASSESSMENT AND PLAN / ED COURSE  Pertinent labs & imaging results that were available during my care of the patient were reviewed by me and considered in my medical decision making (see chart for details).   This patient is Presenting for Evaluation of near syncope, which does require a range of treatment options, and is a  complaint that involves a high risk of morbidity and mortality.  The Differential Diagnoses  includes but is not exclusive to alcohol, illicit or prescription medications, intracranial pathology such as stroke, intracerebral hemorrhage, fever or infectious causes including sepsis, hypoxemia, uremia, trauma, endocrine related disorders such as diabetes, hypoglycemia, thyroid-related diseases, etc.   Critical Interventions-    Medications  sodium chloride 0.9 % bolus 500 mL (0 mLs Intravenous Stopped 04/25/22 1944)  cefTRIAXone (ROCEPHIN) 1 g in sodium chloride 0.9 % 100 mL IVPB (0 g Intravenous Stopped 04/25/22 2214)  0.9 %  sodium chloride infusion (0 mLs Intravenous Stopped 04/25/22 2220)  iohexol (OMNIPAQUE) 350 MG/ML injection 75 mL (75 mLs Intravenous Contrast Given 04/26/22 1021)    Reassessment after intervention: symptoms slightly improved.    Clinical Laboratory Tests Ordered, included BMP with mild hyponatremia at 129. No AKI. No anemia.   Radiologic  Tests Ordered, included CXR. I independently interpreted the images and agree with radiology interpretation.   Cardiac Monitor Tracing which shows NSR.    Social Determinants of Health Risk patient is a non-smoker.   Consult complete with [facility]. Plan for obs admit for syncope evaluation.  Medical Decision Making: Summary:  Patient presents emergency department for evaluation of syncope.  On arrival EMS found her to be hypotensive with a tacky arrhythmia.  Flutter versus A-fib.  She has since converted to normal sinus rhythm.  Labs are reassuring.  Mild hyponatremia. ? UTI as well.   Reevaluation with update and discussion with patient and family.  Ultimately deciding to observe with correction of mild hyponatremia and further syncope evaluation.  Patient's presentation is most consistent with acute presentation with potential threat to life or bodily function.   Disposition: admit  ____________________________________________  FINAL CLINICAL IMPRESSION(S) / ED DIAGNOSES  Final diagnoses:  Syncope and collapse  PAF (paroxysmal atrial fibrillation) (HCC)     NEW OUTPATIENT MEDICATIONS STARTED DURING THIS VISIT:  Discharge Medication List as of 04/27/2022  9:39 AM     START taking these medications   Details  amoxicillin (AMOXIL) 500 MG capsule Take 2 tablets (1,000 mg total) by mouth 2 (two) times daily for 3 days., Starting Mon 04/27/2022, Until Thu 04/30/2022, Normal    aspirin EC 81 MG tablet Take 1 tablet (81 mg total) by mouth daily. Swallow whole., Starting Mon 04/27/2022, Until Tue 04/27/2023, OTC    bisoprolol (ZEBETA) 5 MG tablet Take 1 tablet (5 mg total) by mouth daily., Starting Tue 04/28/2022, Normal        Note:  This document was prepared using Dragon voice recognition software and may include unintentional dictation errors.  Alona Bene, MD, [facility] Emergency Medicine    Bowman Higbie, Arlyss Repress, MD 04/29/22 [phone]

## 2022-04-25 NOTE — ED Triage Notes (Signed)
[facility]aitent bib [facility] after a syncopal episode she was feeling lightheaded and dizzy. Initial BP was 90/60 and HR 130 . [facility] states She went to urgent care this morning and was diagnosed with a UTI. [facility] stated they gave 500 NS and her heart rate went up to 150 and ECG showed A-fiib rvr. [facility] states that she is fluctuating between NSR and AFIB.   Urgent Care prescribed antibiotics but she has not picked them up from pharmacy yet.   HX: A-fib, valve repair

## 2022-04-25 NOTE — ED Triage Notes (Signed)
Pt presents with complaints of generalized abdominal discomfort, diarrhea, and fatigue X 1 week.

## 2022-04-26 ENCOUNTER — Inpatient Hospital Stay (HOSPITAL_COMMUNITY): Payer: 59

## 2022-04-26 ENCOUNTER — Encounter (HOSPITAL_COMMUNITY): Payer: Self-pay | Admitting: Internal Medicine

## 2022-04-26 DIAGNOSIS — I480 Paroxysmal atrial fibrillation: Secondary | ICD-10-CM | POA: Diagnosis not present

## 2022-04-26 DIAGNOSIS — J439 Emphysema, unspecified: Secondary | ICD-10-CM

## 2022-04-26 DIAGNOSIS — I10 Essential (primary) hypertension: Secondary | ICD-10-CM

## 2022-04-26 DIAGNOSIS — R55 Syncope and collapse: Secondary | ICD-10-CM

## 2022-04-26 LAB — ECHOCARDIOGRAM COMPLETE
AR max vel: 2.01 cm2
AV Area VTI: 2.34 cm2
AV Area mean vel: 2.15 cm2
AV Mean grad: 3 mmHg
AV Peak grad: 5.3 mmHg
Ao pk vel: 1.15 m/s
Area-P 1/2: 2.17 cm2
Height: 67 in
S' Lateral: 1.9 cm
Single Plane A4C EF: 62.6 %
Weight: 2257.51 oz

## 2022-04-26 LAB — COMPREHENSIVE METABOLIC PANEL
ALT: 10 U/L (ref 0–44)
ALT: 7 IU/L (ref 0–32)
AST: 18 U/L (ref 15–41)
AST: 19 IU/L (ref 0–40)
Albumin/Globulin Ratio: 0.9 — ABNORMAL LOW (ref 1.2–2.2)
Albumin: 3 g/dL — ABNORMAL LOW (ref 3.5–5.0)
Albumin: 4.3 g/dL (ref 3.8–4.8)
Alkaline Phosphatase: 44 U/L (ref 38–126)
Alkaline Phosphatase: 69 IU/L (ref 44–121)
Anion gap: 8 (ref 5–15)
BUN/Creatinine Ratio: 11 — ABNORMAL LOW (ref 12–28)
BUN: 11 mg/dL (ref 8–23)
BUN: 13 mg/dL (ref 8–27)
Bilirubin Total: 0.6 mg/dL (ref 0.0–1.2)
CO2: 22 mmol/L (ref 22–32)
CO2: 20 mmol/L (ref 20–29)
Calcium: 8.6 mg/dL — ABNORMAL LOW (ref 8.9–10.3)
Calcium: 9.6 mg/dL (ref 8.7–10.3)
Chloride: 101 mmol/L (ref 98–111)
Chloride: 92 mmol/L — ABNORMAL LOW (ref 96–106)
Creatinine, Ser: 1.1 mg/dL — ABNORMAL HIGH (ref 0.44–1.00)
Creatinine, Ser: 1.16 mg/dL — ABNORMAL HIGH (ref 0.57–1.00)
GFR, Estimated: 51 mL/min — ABNORMAL LOW (ref >60)
Globulin, Total: 4.7 g/dL — ABNORMAL HIGH (ref 1.5–4.5)
Glucose, Bld: 86 mg/dL (ref 70–99)
Glucose: 104 mg/dL — ABNORMAL HIGH (ref 70–99)
Potassium: 3.7 mmol/L (ref 3.5–5.1)
Potassium: 3.8 mmol/L (ref 3.5–5.2)
Sodium: 131 mmol/L — ABNORMAL LOW (ref 135–145)
Sodium: 127 mmol/L — ABNORMAL LOW (ref 134–144)
Total Bilirubin: 0.6 mg/dL (ref 0.3–1.2)
Total Protein: 7.4 g/dL (ref 6.5–8.1)
Total Protein: 9 g/dL — ABNORMAL HIGH (ref 6.0–8.5)
eGFR: 48 mL/min/{1.73_m2} — ABNORMAL LOW (ref >59)

## 2022-04-26 LAB — CBC
HCT: 33.1 % — ABNORMAL LOW (ref 36.0–46.0)
Hemoglobin: 10.8 g/dL — ABNORMAL LOW (ref 12.0–15.0)
MCH: 29 pg (ref 26.0–34.0)
MCHC: 32.6 g/dL (ref 30.0–36.0)
MCV: 88.7 fL (ref 80.0–100.0)
Platelets: 203 10*3/uL (ref 150–400)
RBC: 3.73 MIL/uL — ABNORMAL LOW (ref 3.87–5.11)
RDW: 13.4 % (ref 11.5–15.5)
WBC: 5.4 10*3/uL (ref 4.0–10.5)
nRBC: 0 % (ref 0.0–0.2)

## 2022-04-26 LAB — CBC WITH DIFFERENTIAL/PLATELET
Basophils Absolute: 0 10*3/uL (ref 0.0–0.2)
Basos: 0 %
EOS (ABSOLUTE): 0.1 10*3/uL (ref 0.0–0.4)
Eos: 1 %
Hematocrit: 38.9 % (ref 34.0–46.6)
Hemoglobin: 12.9 g/dL (ref 11.1–15.9)
Immature Grans (Abs): 0 10*3/uL (ref 0.0–0.1)
Immature Granulocytes: 0 %
Lymphocytes Absolute: 1.7 10*3/uL (ref 0.7–3.1)
Lymphs: 23 %
MCH: 29.5 pg (ref 26.6–33.0)
MCHC: 33.2 g/dL (ref 31.5–35.7)
MCV: 89 fL (ref 79–97)
Monocytes Absolute: 0.7 10*3/uL (ref 0.1–0.9)
Monocytes: 10 %
Neutrophils Absolute: 4.7 10*3/uL (ref 1.4–7.0)
Neutrophils: 66 %
Platelets: 213 10*3/uL (ref 150–450)
RBC: 4.38 x10E6/uL (ref 3.77–5.28)
RDW: 14.1 % (ref 11.7–15.4)
WBC: 7.1 10*3/uL (ref 3.4–10.8)

## 2022-04-26 LAB — LACTIC ACID, PLASMA: Lactic Acid, Venous: 0.9 mmol/L (ref 0.5–1.9)

## 2022-04-26 LAB — PROCALCITONIN: Procalcitonin: <0.1 ng/mL

## 2022-04-26 LAB — GLUCOSE, CAPILLARY
Glucose-Capillary: 86 mg/dL (ref 70–99)
Glucose-Capillary: 113 mg/dL — ABNORMAL HIGH (ref 70–99)
Glucose-Capillary: 112 mg/dL — ABNORMAL HIGH (ref 70–99)

## 2022-04-26 LAB — SEDIMENTATION RATE: Sed Rate: 40 mm/hr — ABNORMAL HIGH (ref 0–22)

## 2022-04-26 MED ORDER — GABAPENTIN 300 MG PO CAPS
300.0000 mg | ORAL_CAPSULE | Freq: Three times a day (TID) | ORAL | Status: DC
  Administered 2022-04-26 – 2022-04-27 (×3): 300 mg via ORAL
  Filled 2022-04-26 (×3): qty 1

## 2022-04-26 MED ORDER — ORAL CARE MOUTH RINSE: 15.0000 mL | OROMUCOSAL | Status: DC | PRN

## 2022-04-26 MED ORDER — LISINOPRIL 5 MG PO TABS
10.0000 mg | ORAL_TABLET | Freq: Every day | ORAL | Status: DC
  Administered 2022-04-26 – 2022-04-27 (×2): 10 mg via ORAL
  Filled 2022-04-26 (×2): qty 2

## 2022-04-26 MED ORDER — PANTOPRAZOLE SODIUM 40 MG PO TBEC
40.0000 mg | DELAYED_RELEASE_TABLET | Freq: Every day | ORAL | Status: DC
  Administered 2022-04-26 – 2022-04-27 (×2): 40 mg via ORAL
  Filled 2022-04-26 (×2): qty 1

## 2022-04-26 MED ORDER — HYDROXYCHLOROQUINE SULFATE 200 MG PO TABS
200.0000 mg | ORAL_TABLET | Freq: Every day | ORAL | Status: DC
  Administered 2022-04-26 – 2022-04-27 (×2): 200 mg via ORAL
  Filled 2022-04-26 (×2): qty 1

## 2022-04-26 MED ORDER — ENSURE ENLIVE PO LIQD
237.0000 mL | Freq: Two times a day (BID) | ORAL | Status: DC
  Administered 2022-04-26 – 2022-04-27 (×3): 237 mL via ORAL

## 2022-04-26 MED ORDER — IOHEXOL 350 MG/ML SOLN
75.0000 mL | Freq: Once | INTRAVENOUS | Status: AC | PRN
  Administered 2022-04-26: 75 mL via INTRAVENOUS

## 2022-04-26 MED ORDER — INSULIN ASPART 100 UNIT/ML IJ SOLN: 0.0000 [IU] | Freq: Three times a day (TID) | INTRAMUSCULAR | Status: DC

## 2022-04-26 MED ORDER — LEVOTHYROXINE SODIUM 100 MCG PO TABS
100.0000 ug | ORAL_TABLET | Freq: Every day | ORAL | Status: DC
  Administered 2022-04-26 – 2022-04-27 (×2): 100 ug via ORAL
  Filled 2022-04-26 (×2): qty 1

## 2022-04-26 NOTE — Progress Notes (Signed)
PROGRESS NOTE        PATIENT DETAILS Name: [name] Age: 82 y.o. Sex: female Date of Birth: [birth year] Admit Date: 04/25/2022 Admitting Physician Clance Boll, MD SQ:[id number], Hal Hope, MD  Brief Summary: Patient is a 81 y.o.  female ILD, DM-2, HTN, hypothyroidism-who presented to a local urgent care yesterday due to fatigue/subjective fever-was found to have UTI-started on Keflex and discharged home, she subsequently had a presyncopal episode at a [employer] and was brought to the ED-and subsequently admitted to the hospitalist service.  Per EMS records-patient apparently had a brief episode of atrial fibrillation/atrial flutter-however has been maintaining sinus rhythm since her arrival in the emergency room.  Significant events: 3/30>> admit to [facility]  Significant studies: 3/30>> CXR: Basilar predominant reticulation-likely related to ILD 3/31>> CT head: No acute intracranial abnormality  Significant microbiology data: 3/30>> COVID PCR: Negative 3/30>> urine culture: Pending  Procedures: None  Consults: Cardiology-Brief phone consult with Dr. Shellia Carwin on 3/31.  Subjective: Lying comfortably in bed-denies any chest pain or shortness of breath.  Objective: Vitals: Blood pressure (!) 146/68, pulse 70, temperature 98.1 F (36.7 C), temperature source Oral, resp. rate (!) 21, height 5\' 7"  (1.702 m), weight 64 kg, SpO2 95 %.   Exam: Gen Exam:Alert awake-not in any distress HEENT:atraumatic, normocephalic Chest: B/L clear to auscultation anteriorly CVS:S1S2 regular Abdomen:soft non tender, non distended Extremities:no edema Neurology: Non focal Skin: no rash  Pertinent Labs/Radiology:    Latest Ref Rng & Units 04/26/2022   12:45 AM 04/25/2022   10:11 PM 04/25/2022    5:04 PM  CBC  WBC 4.0 - 10.5 K/uL 5.4  5.3  6.4   Hemoglobin 12.0 - 15.0 g/dL 10.8  10.7  11.3   Hematocrit 36.0 - 46.0 % 33.1  31.3  32.8   Platelets 150 - 400 K/uL  203  207  197     Lab Results  Component Value Date   NA 131 (L) 04/26/2022   K 3.7 04/26/2022   CL 101 04/26/2022   CO2 22 04/26/2022      Assessment/Plan: Syncope Unclear etiology-probably related to volume depletion in the setting of UTI-apparently was dizzy when she stood up from her car-and subsequently had a syncopal/presyncopal episode Check orthostatic vital signs Check echo Telemetry monitoring-but no arrhythmias overnight.  Brief episode of a flutter/A-fib with RVR Had a brief episode of a flutter/A-fib as documented by EMS-but has been maintaining sinus rhythm since she presented to the ED TSH stable Echo pending Case discussed with cardiologist-Dr Custovic over the phone on 3/31-since a very brief episode-hold off on starting anticoagulation. CHADS2 Vasc of at least 4. If has recurrent A-fib/flutter-will start anticoagulation.  UTI Afebrile overnight-nontoxic-appearing-no leukocytosis Continue Rocephin x 3 days Await culture.  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 Continue bronchodilators Resume outpatient follow-up with Dr. Chase Caller  DM-2 Continue to hold Amaryl Start SSI Follow CBGs  Recent Labs    04/25/22 1658  GLUCAP 78    HTN Resume lisinopril Await orthostatics  Hypothyroidism Resume Synthroid TSH stable.  History of possible lupus Resume Plaquenil  Peripheral neuropathy Likely related to DM-2 Resume Neurontin   Underweight: Estimated body mass index is 22.1 kg/m as calculated from the following:   Height as of this encounter: 5\' 7"  (1.702 m).   Weight as of this encounter: 64 kg.   Code status:   Code  Status: Full Code   DVT Prophylaxis: heparin injection 5,000 Units Start: 04/26/22 0600   Family Communication: None at bedside   Disposition Plan: Status is: Inpatient Remains inpatient appropriate because: Severity of illness   Planned Discharge Destination:Home   Diet: Diet Order             Diet Carb Modified  Fluid consistency: Thin; Room service appropriate? Yes  Diet effective now                     Antimicrobial agents: Anti-infectives (From admission, onward)    Start     Dose/Rate Route Frequency Ordered Stop   04/26/22 2000  cefTRIAXone (ROCEPHIN) 1 g in sodium chloride 0.9 % 100 mL IVPB        1 g 200 mL/hr over 30 Minutes Intravenous Every 24 hours 04/25/22 2157     04/25/22 2045  cefTRIAXone (ROCEPHIN) 1 g in sodium chloride 0.9 % 100 mL IVPB        1 g 200 mL/hr over 30 Minutes Intravenous  Once 04/25/22 2039 04/25/22 2214        MEDICATIONS: Scheduled Meds:  feeding supplement  237 mL Oral BID BM   heparin  5,000 Units Subcutaneous Q8H   Continuous Infusions:  sodium chloride 75 mL/hr at 04/26/22 0100   cefTRIAXone (ROCEPHIN)  IV     PRN Meds:.acetaminophen **OR** acetaminophen, levalbuterol, ondansetron **OR** ondansetron (ZOFRAN) IV, mouth rinse   I have personally reviewed following labs and imaging studies  LABORATORY DATA: CBC: Recent Labs  Lab 04/25/22 1223 04/25/22 1704 04/25/22 2211 04/26/22 0045  WBC 7.1 6.4 5.3 5.4  NEUTROABS 4.7  --   --   --   HGB 12.9 11.3* 10.7* 10.8*  HCT 38.9 32.8* 31.3* 33.1*  MCV 89 86.1 86.9 88.7  PLT 213 197 207 [id number]    Basic Metabolic Panel: Recent Labs  Lab 04/25/22 1223 04/25/22 1704 04/25/22 2211 04/26/22 0045  NA 127* 126* 129* 131*  K 3.8 3.4* 3.4* 3.7  CL 92* 97* 101 101  CO2 20 19* 19* 22  GLUCOSE 104* 80 73 86  BUN 13 13 11 11   CREATININE 1.16* 1.24* 1.02* 1.10*  CALCIUM 9.6 8.6* 8.2* 8.6*  MG  --  2.0  --   --     GFR: Estimated Creatinine Clearance: 40.3 mL/min (A) (by C-G formula based on SCr of 1.1 mg/dL (H)).  Liver Function Tests: Recent Labs  Lab 04/25/22 1223 04/25/22 1704 04/26/22 0045  AST 19 20 18   ALT 7 12 10   ALKPHOS 69 46 44  BILITOT 0.6 0.7 0.6  PROT 9.0* 8.0 7.4  ALBUMIN 4.3 3.2* 3.0*   Recent Labs  Lab 04/25/22 1704  LIPASE 34   No results for input(s):  "AMMONIA" in the last 168 hours.  Coagulation Profile: Recent Labs  Lab 04/25/22 1704  INR 1.2    Cardiac Enzymes: No results for input(s): "CKTOTAL", "CKMB", "CKMBINDEX", "TROPONINI" in the last 168 hours.  BNP (last 3 results) Recent Labs    07/03/21 1643 03/26/22 1525  PROBNP 148.0* 32.0    Lipid Profile: No results for input(s): "CHOL", "HDL", "LDLCALC", "TRIG", "CHOLHDL", "LDLDIRECT" in the last 72 hours.  Thyroid Function Tests: Recent Labs    04/25/22 2211  TSH 3.005    Anemia Panel: No results for input(s): "VITAMINB12", "FOLATE", "FERRITIN", "TIBC", "IRON", "RETICCTPCT" in the last 72 hours.  Urine analysis:    Component Value Date/Time   COLORURINE YELLOW 04/25/2022  1955   APPEARANCEUR HAZY (A) 04/25/2022 1955   LABSPEC 1.005 04/25/2022 1955   PHURINE 6.0 04/25/2022 1955   GLUCOSEU NEGATIVE 04/25/2022 1955   HGBUR NEGATIVE 04/25/2022 1955   [place] NEGATIVE 04/25/2022 1955   BILIRUBINUR negative 04/25/2022 [place] 04/25/2022 1955   PROTEINUR NEGATIVE 04/25/2022 1955   UROBILINOGEN 1.0 04/25/2022 1221   UROBILINOGEN 0.2 06/20/2014 1842   NITRITE NEGATIVE 04/25/2022 1955   LEUKOCYTESUR MODERATE (A) 04/25/2022 1955    Sepsis Labs: Lactic Acid, Venous    Component Value Date/Time   LATICACIDVEN 0.9 04/26/2022 0045    MICROBIOLOGY: Recent Results (from the past 240 hour(s))  SARS Coronavirus 2 by RT PCR (hospital order, performed in [place] [facility] lab) *cepheid single result test* Anterior Nasal Swab     Status: None   Collection Time: 04/25/22  5:02 PM   Specimen: Anterior Nasal Swab  Result Value Ref Range Status   SARS Coronavirus 2 by RT PCR NEGATIVE NEGATIVE Final    Comment: Performed at [facility], [place] [address] [place], [place] [address]    RADIOLOGY STUDIES/RESULTS: CT HEAD WO CONTRAST (5MM)  Result Date: 04/26/2022 CLINICAL DATA:  Syncope EXAM: CT HEAD WITHOUT CONTRAST TECHNIQUE: Contiguous  axial images were obtained from the base of the skull through the vertex without intravenous contrast. RADIATION DOSE REDUCTION: This exam was performed according to the departmental dose-optimization program which includes automated exposure control, adjustment of the mA and/or kV according to patient size and/or use of iterative reconstruction technique. COMPARISON:  02/17/2019 FINDINGS: Brain: There is no mass, hemorrhage or extra-axial collection. The size and configuration of the ventricles and extra-axial CSF spaces are normal. There is hypoattenuation of the white matter, most commonly indicating chronic small vessel disease. Vascular: Atherosclerotic calcification of the vertebral and internal carotid arteries at the skull base. No abnormal hyperdensity of the major intracranial arteries or dural venous sinuses. Skull: The visualized skull base, calvarium and extracranial soft tissues are normal. Sinuses/Orbits: No fluid levels or advanced mucosal thickening of the visualized paranasal sinuses. No mastoid or middle ear effusion. The orbits are normal. IMPRESSION: 1. No acute intracranial abnormality. 2. Chronic small vessel disease. Electronically Signed   By: Ulyses Jarred M.D.   On: 04/26/2022 00:37   DG Chest 2 View  Result Date: 04/25/2022 CLINICAL DATA:  Worsening cough. Diarrhea and fatigue for 1 week. History of interstitial lung disease. EXAM: CHEST - 2 VIEW COMPARISON:  07/31/2021 FINDINGS: Lateral view degraded by patient arm position. Midline trachea. Normal heart size. Atherosclerosis in the transverse aorta. Diffuse interstitial thickening with more focal reticulation in the left-greater-than-right lower lobes, similar. Given differences in technique, no evidence of acute superimposed lobar consolidation. IMPRESSION: Basilar predominant reticulation, likely related to history of interstitial lung disease. Similar to 07/31/2021. No acute superimposed process. Aortic Atherosclerosis  (ICD10-I70.0). Electronically Signed   By: [name] M.D.   On: 04/25/2022 12:10     LOS: 1 day   Oren Binet, MD  [facility]    To contact the attending provider between 7A-7P or the covering provider during after hours 7P-7A, please log into the web site [email or web address] and access using universal [place] password for that web site. If you do not have the password, please call the hospital operator.  04/26/2022, 9:22 AM

## 2022-04-26 NOTE — Progress Notes (Signed)
  Echocardiogram 2D Echocardiogram has been performed.  [name] 04/26/2022, 5:12 PM

## 2022-04-27 ENCOUNTER — Telehealth: Payer: Self-pay | Admitting: Internal Medicine

## 2022-04-27 ENCOUNTER — Other Ambulatory Visit (HOSPITAL_COMMUNITY): Payer: Self-pay

## 2022-04-27 DIAGNOSIS — R55 Syncope and collapse: Secondary | ICD-10-CM | POA: Diagnosis not present

## 2022-04-27 DIAGNOSIS — I480 Paroxysmal atrial fibrillation: Secondary | ICD-10-CM | POA: Diagnosis not present

## 2022-04-27 LAB — HEMOGLOBIN A1C
Hgb A1c MFr Bld: 6.7 % — ABNORMAL HIGH (ref 4.8–5.6)
Mean Plasma Glucose: 146 mg/dL

## 2022-04-27 LAB — URINE CULTURE: Culture: >=100000 — AB

## 2022-04-27 LAB — GLUCOSE, CAPILLARY: Glucose-Capillary: 85 mg/dL (ref 70–99)

## 2022-04-27 MED ORDER — BISOPROLOL FUMARATE 5 MG PO TABS
5.0000 mg | ORAL_TABLET | Freq: Every day | ORAL | Status: DC
  Administered 2022-04-27: 5 mg via ORAL
  Filled 2022-04-27: qty 1

## 2022-04-27 MED ORDER — BISOPROLOL FUMARATE 5 MG PO TABS
5.0000 mg | ORAL_TABLET | Freq: Every day | ORAL | 1 refills | Status: DC
  Filled 2022-04-27: qty 30, 30d supply, fill #0

## 2022-04-27 MED ORDER — AMOXICILLIN 500 MG PO CAPS
1000.0000 mg | ORAL_CAPSULE | Freq: Two times a day (BID) | ORAL | 0 refills | Status: AC
  Filled 2022-04-27: qty 12, 3d supply, fill #0

## 2022-04-27 MED ORDER — ASPIRIN 81 MG PO TBEC: 81.0000 mg | DELAYED_RELEASE_TABLET | Freq: Every day | ORAL | 2 refills | Status: DC

## 2022-04-27 NOTE — Evaluation (Signed)
Occupational Therapy Evaluation and Discharge Patient Details Name: [name] MRN: [id number] DOB: 1940-04-05 Today's Date: 04/27/2022   History of Present Illness Patient is a 82 y/o female who presents on 3/30 to urgent care due to abdominal discomfort, fatigue and diarrhea, found to have UTI. Later that day presents to ED due to syncope/presyncope episode at a restaurant, with a brief episode of A-fib/A-flutter but back in NSR. PMH includes HTN, DM, lupus, hx of breast ca.   Clinical Impression   Pt is functioning at a set up to supervision level in ADLs and ADL transfers. Reinforced PTs recommendation to use DME as needed at home. Pt reports 24 hour supervision of her family. No further OT needs.      Recommendations for follow up therapy are one component of a multi-disciplinary discharge planning process, led by the attending physician.  Recommendations may be updated based on patient status, additional functional criteria and insurance authorization.   Assistance Recommended at Discharge Intermittent Supervision/Assistance  Patient can return home with the following A little help with walking and/or transfers;Assistance with cooking/housework;Two people to help with bathing/dressing/bathroom;Direct supervision/assist for medications management;Direct supervision/assist for financial management;Assist for transportation;Help with stairs or ramp for entrance    Functional Status Assessment  Patient has had a recent decline in their functional status and demonstrates the ability to make significant improvements in function in a reasonable and predictable amount of time.  Equipment Recommendations  None recommended by OT    Recommendations for Other Services       Precautions / Restrictions Precautions Precautions: Fall Restrictions Weight Bearing Restrictions: No      Mobility Bed Mobility               General bed mobility comments: in chair    Transfers Overall  transfer level: Modified independent Equipment used: None               General transfer comment: from chair and toilet      Balance     Sitting balance-Leahy Scale: Good       Standing balance-Leahy Scale: Fair                             ADL either performed or assessed with clinical judgement   ADL Overall ADL's : Needs assistance/impaired Eating/Feeding: Independent;Sitting   Grooming: Wash/dry hands;Standing;Supervision/safety   Upper Body Bathing: Supervision/ safety;Standing   Lower Body Bathing: Set up;Sit to/from stand   Upper Body Dressing : Set up;Sitting   Lower Body Dressing: Sit to/from stand;Supervision/safety   Toilet Transfer: Supervision/safety   Toileting- Clothing Manipulation and Hygiene: Supervision/safety       Functional mobility during ADLs: Supervision/safety       Vision Ability to See in Adequate Light: 0 Adequate Patient Visual Report: No change from baseline       Perception     Praxis      Pertinent Vitals/Pain Pain Assessment Pain Assessment: No/denies pain     Hand Dominance Right   Extremity/Trunk Assessment Upper Extremity Assessment Upper Extremity Assessment: Overall WFL for tasks assessed   Lower Extremity Assessment Lower Extremity Assessment: Defer to PT evaluation   Cervical / Trunk Assessment Cervical / Trunk Assessment: Normal   Communication Communication Communication: HOH   Cognition Arousal/Alertness: Awake/alert Behavior During Therapy: WFL for tasks assessed/performed Overall Cognitive Status: No family/caregiver present to determine baseline cognitive functioning  General Comments: impaired problem solving, needing assist to manage TV remote, may be baseline     General Comments  Supine BP 155/75, HR 71 bpm, Sitting BP 109/73, HR 75 bpm, Standing BP 114/63, HR 87 bpm, Sitting BP post activity 128/76.    Exercises      Shoulder Instructions      Home Living Family/patient expects to be discharged to:: Private residence Living Arrangements: Children Available Help at Discharge: Family;Available 24 hours/day Type of Home: House Home Access: Stairs to enter [employer] of Steps: 1 Entrance Stairs-Rails: None Home Layout: Two level;Able to live on main level with bedroom/bathroom;1/2 bath on main level     Bathroom Shower/Tub: Sponge bathes at baseline   Bathroom Toilet: Standard     Home Equipment: Cane - single point;BSC/3in1;Rollator (4 wheels)          Prior Functioning/Environment Prior Level of Function : Independent/Modified Independent             Mobility Comments: independent, does some IADLS. Uses SPC PRN, no driving ADLs Comments: independent, does a sink bath as full bath is upstairs        OT Problem List:        OT Treatment/Interventions:      OT Goals(Current goals can be found in the care plan section)    OT Frequency:      Co-evaluation              AM-PAC OT "6 Clicks" Daily Activity     Outcome Measure Help from another person eating meals?: None Help from another person taking care of personal grooming?: None Help from another person toileting, which includes using toliet, bedpan, or urinal?: None Help from another person bathing (including washing, rinsing, drying)?: None Help from another person to put on and taking off regular upper body clothing?: None Help from another person to put on and taking off regular lower body clothing?: None 6 Click Score: 24   End of Session Equipment Utilized During Treatment: Gait belt  Activity Tolerance: Patient tolerated treatment well Patient left: in chair;with call bell/phone within reach  OT Visit Diagnosis: Muscle weakness (generalized) (M62.81)                Time: [id number] OT Time Calculation (min): 13 min Charges:  OT General Charges $OT Visit: 1 Visit OT Evaluation $OT Eval Low  Complexity: 1 Low  Cleta Alberts, OTR/L Acute Rehabilitation Services Office: [phone]   Malka So 04/27/2022, 10:15 AM

## 2022-04-27 NOTE — Telephone Encounter (Signed)
cancel

## 2022-04-27 NOTE — Discharge Summary (Addendum)
PATIENT DETAILS Name: [name] Age: 82 y.o. Sex: female Date of Birth: [birth year] MRN: [id number]. Admitting Physician: Clance Boll, MD [id number], Hal Hope, MD  Admit Date: 04/25/2022 Discharge date: 04/27/2022  Recommendations for Outpatient Follow-up:  Follow up with PCP in 1-2 weeks Please obtain CMP/CBC in one week Please ensure follow-up with cardiology.  Admitted From:  Home  Disposition: Home    Discharge Condition: good  CODE STATUS:   Code Status: Full Code   Diet recommendation:  Diet Order             Diet - low sodium heart healthy           Diet Carb Modified           Diet Carb Modified Fluid consistency: Thin; Room service appropriate? Yes  Diet effective now                    Brief Summary: Patient is a 82 y.o.  female ILD, DM-2, HTN, hypothyroidism-who presented to a local urgent care yesterday due to fatigue/subjective fever-was found to have UTI-started on Keflex and discharged home, she subsequently had a presyncopal episode at a [employer] and was brought to the ED-and subsequently admitted to the hospitalist service.  Per EMS records-patient apparently had a brief episode of atrial fibrillation/atrial flutter-however has been maintaining sinus rhythm since her arrival in the emergency room.   Significant events: 3/30>> admit to [facility]   Significant studies: 3/30>> CXR: Basilar predominant reticulation-likely related to ILD 3/31>> CT head: No acute intracranial abnormality   Significant microbiology data: 3/30>> COVID PCR: Negative 3/30>> urine culture: Pending   Procedures: None   Consults: Cardiology-Brief phone consult with Dr. Shellia Carwin on 3/31.  Brief Hospital Course: Syncope Unclear etiology-probably related to volume depletion in the setting of UTI-apparently was dizzy when she stood up from her car-and subsequently had a syncopal/presyncopal episode Orthostatic vital sign were negative yesterday-but this  was after she already received fluids Echo with preserved EF, CTA chest without PE. Telemetry negative for any further recurrence of A-fib or any significant arrhythmias.   Brief episode of a flutter/A-fib with RVR Had a brief episode of a flutter/A-fib as documented by EMS-but has been maintaining sinus rhythm since she presented to the ED TSH stable Echo stable EF Case discussed with cardiologist-Dr Custovic over the phone on 3/31-since very brief episode-hold off on starting anticoagulation for now. CHADS2 Vasc of at least 4. If has recurrent A-fib/flutter-will start anticoagulation per cardiology recommendations Please ensure outpatient follow-up with patient's primary cardiologist.   UTI Afebrile overnight-nontoxic-appearing-no leukocytosis Urine culture positive for Enterococcus Stop Rocephin-Will switch to amoxicillin.   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 Continue bronchodilators Resume outpatient follow-up with Dr. Chase Caller   DM-2 CBG stable with SSI Resume Amaryl on discharge.    HTN BP on the higher side continue lisinopril-add low-dose beta-blocker.   Hypothyroidism Resume Synthroid TSH stable.   History of possible lupus Continue Plaquenil   Peripheral neuropathy Likely related to DM-2 Continue Neurontin    Underweight: Estimated body mass index is 22.1 kg/m as calculated from the following:   Height as of this encounter: 5\' 7"  (1.702 m).   Weight as of this encounter: 64 kg   Discharge Diagnoses:  Principal Problem:   Syncope   Discharge Instructions:  Activity:  As tolerated  Discharge Instructions     Call MD for:  extreme fatigue   Complete by: As directed    Call MD for:  persistant  nausea and vomiting   Complete by: As directed    Diet - low sodium heart healthy   Complete by: As directed    Diet Carb Modified   Complete by: As directed    Discharge instructions   Complete by: As directed    Follow with Primary MD  Carol Ada, MD in 1-2 weeks  Please get a complete blood count and chemistry panel checked by your Primary MD at your next visit, and again as instructed by your Primary MD.  Get Medicines reviewed and adjusted: Please take all your medications with you for your next visit with your Primary MD  Laboratory/radiological data: Please request your Primary MD to go over all hospital tests and procedure/radiological results at the follow up, please ask your Primary MD to get all Hospital records sent to his/her office.  In some cases, they will be blood work, cultures and biopsy results pending at the time of your discharge. Please request that your primary care M.D. follows up on these results.  Also Note the following: If you experience worsening of your admission symptoms, develop shortness of breath, life threatening emergency, suicidal or homicidal thoughts you must seek medical attention immediately by calling 911 or calling your MD immediately  if symptoms less severe.  You must read complete instructions/literature along with all the possible adverse reactions/side effects for all the Medicines you take and that have been prescribed to you. Take any new Medicines after you have completely understood and accpet all the possible adverse reactions/side effects.   Do not drive when taking Pain medications or sleeping medications (Benzodaizepines)  Do not take more than prescribed Pain, Sleep and Anxiety Medications. It is not advisable to combine anxiety,sleep and pain medications without talking with your primary care practitioner  Special Instructions: If you have smoked or chewed Tobacco  in the last 2 yrs please stop smoking, stop any regular Alcohol  and or any Recreational drug use.  Wear Seat belts while driving.  Please note: You were cared for by a hospitalist during your hospital stay. Once you are discharged, your primary care physician will handle any further medical issues. Please  note that NO REFILLS for any discharge medications will be authorized once you are discharged, as it is imperative that you return to your primary care physician (or establish a relationship with a primary care physician if you do not have one) for your post hospital discharge needs so that they can reassess your need for medications and monitor your lab values.   Increase activity slowly   Complete by: As directed       Allergies as of 04/27/2022       Reactions   Ciprofloxacin Itching, Swelling, Other (See Comments)   Possibly causing tremors?   Crestor [rosuvastatin] Other (See Comments)   Myalgia and back pain   Ofev [nintedanib] Nausea Only        Medication List     STOP taking these medications    cephALEXin 500 MG capsule Commonly known as: KEFLEX       TAKE these medications    albuterol 108 (90 Base) MCG/ACT inhaler Commonly known as: VENTOLIN HFA Inhale 2 puffs into the lungs every 4 (four) hours as needed for wheezing or shortness of breath.   amoxicillin 500 MG tablet Commonly known as: AMOXIL Take 2 tablets (1,000 mg total) by mouth 2 (two) times daily for 3 days.   aspirin EC 81 MG tablet Take 1 tablet (81 mg  total) by mouth daily. Swallow whole.   bisoprolol 5 MG tablet Commonly known as: ZEBETA Take 1 tablet (5 mg total) by mouth daily. Start taking on: April 28, 2022   diclofenac Sodium 1 % Gel Commonly known as: VOLTAREN Apply 1 application. topically 4 (four) times daily as needed (pain).   ferrous sulfate 325 (65 FE) MG tablet Take 325 mg by mouth daily.   fluticasone 50 MCG/ACT nasal spray Commonly known as: FLONASE Place 1 spray into both nostrils daily as needed for allergies or rhinitis.   gabapentin 300 MG capsule Commonly known as: NEURONTIN Take 300 mg by mouth every 8 (eight) hours.   glimepiride 1 MG tablet Commonly known as: AMARYL Take 1 mg by mouth daily with breakfast.   hydroxychloroquine 200 MG tablet Commonly known  as: PLAQUENIL Take 1 tablet (200 mg total) by mouth daily.   ibuprofen 200 MG tablet Commonly known as: ADVIL Take 200 mg by mouth every 6 (six) hours as needed for moderate pain or headache.   ipratropium-albuterol 0.5-2.5 (3) MG/3ML Soln Commonly known as: DUONEB Take 3 mLs by nebulization every 6 (six) hours as needed.   levothyroxine 100 MCG tablet Commonly known as: SYNTHROID Take 100 mcg by mouth daily.   lisinopril 10 MG tablet Commonly known as: ZESTRIL Take 10 mg by mouth daily.   omeprazole 20 MG capsule Commonly known as: PRILOSEC Take 20 mg by mouth daily before breakfast.   OVER THE COUNTER MEDICATION Take 1 tablet by mouth daily as needed (allergies). Flonase headache & allergy relief        Allergies  Allergen Reactions   Ciprofloxacin Itching, Swelling and Other (See Comments)    Possibly causing tremors?   Crestor [Rosuvastatin] Other (See Comments)    Myalgia and back pain   Ofev [Nintedanib] Nausea Only     Other Procedures/Studies: ECHOCARDIOGRAM COMPLETE  Result Date: 04/26/2022    ECHOCARDIOGRAM REPORT   Patient Name:   [name] Date of Exam: 04/26/2022 Medical Rec #:  [id number]      Height:       67.0 in Accession #:    [id number]     Weight:       141.1 lb Date of Birth:  [birth year]       BSA:          1.744 m Patient Age:    22 years       BP: Patient Gender: F              HR: Exam Location:  Referring Phys: [place]  1. Left ventricular ejection fraction, by estimation, is 70 to 75%. The left ventricle has hyperdynamic function. The left ventricle has no regional wall motion abnormalities. There is moderate left ventricular hypertrophy. Left ventricular diastolic parameters are consistent with Grade I diastolic dysfunction (impaired relaxation).  2. Right ventricular systolic function is normal. The right ventricular size is normal.  3. The mitral valve is abnormal. The tip of the anterior mitral valve leaflet is  severely calcified. No evidence of mitral valve regurgitation. No evidence of mitral stenosis.  4. The aortic valve is tricuspid. Aortic valve regurgitation is not visualized. No aortic stenosis is present. Comparison(s): No significant change from prior study. FINDINGS  Left Ventricle: Left ventricular ejection fraction, by estimation, is 70 to 75%. The left ventricle has hyperdynamic function. The left ventricle has no regional wall motion abnormalities. The left ventricular internal cavity size was normal in size. There  is moderate left ventricular hypertrophy. Left ventricular diastolic parameters are consistent with Grade I diastolic dysfunction (impaired relaxation). Right Ventricle: The right ventricular size is normal. No increase in right ventricular wall thickness. Right ventricular systolic function is normal. Left Atrium: Left atrial size was normal in size. Right Atrium: Right atrial size was normal in size. Pericardium: There is no evidence of pericardial effusion. Mitral Valve: The mitral valve is abnormal. There is severe calcification of the anterior mitral valve leaflet(s). No evidence of mitral valve regurgitation. No evidence of mitral valve stenosis. Tricuspid Valve: The tricuspid valve is not well visualized. Tricuspid valve regurgitation is not demonstrated. No evidence of tricuspid stenosis. Aortic Valve: The aortic valve is tricuspid. Aortic valve regurgitation is not visualized. No aortic stenosis is present. Aortic valve mean gradient measures 3.0 mmHg. Aortic valve peak gradient measures 5.3 mmHg. Aortic valve area, by VTI measures 2.34 cm. Pulmonic Valve: The pulmonic valve was not well visualized. Pulmonic valve regurgitation is mild to moderate. No evidence of pulmonic stenosis. Aorta: The aortic root is normal in size and structure. Venous: The inferior vena cava was not well visualized. IAS/Shunts: The interatrial septum was not well visualized.  LEFT VENTRICLE PLAX 2D LVIDd:          3.30 cm     Diastology LVIDs:         1.90 cm     LV e' medial:    5.87 cm/s LV PW:         1.30 cm     LV E/e' medial:  12.4 LV IVS:        1.40 cm     LV e' lateral:   8.16 cm/s LVOT diam:     1.80 cm     LV E/e' lateral: 8.9 LV SV:         62 LV SV Index:   36 LVOT Area:     2.54 cm  LV Volumes (MOD) LV vol d, MOD A4C: 58.9 ml LV vol s, MOD A4C: 22.0 ml LV SV MOD A4C:     58.9 ml RIGHT VENTRICLE RV S prime:     18.90 cm/s TAPSE (M-mode): 2.1 cm LEFT ATRIUM             Index        RIGHT ATRIUM           Index LA Vol (A2C):   37.8 ml 21.68 ml/m  RA Area:     13.60 cm LA Vol (A4C):   42.8 ml 24.55 ml/m  RA Volume:   31.80 ml  18.24 ml/m LA Biplane Vol: 41.3 ml 23.69 ml/m  AORTIC VALVE AV Area (Vmax):    2.01 cm AV Area (Vmean):   2.15 cm AV Area (VTI):     2.34 cm AV Vmax:           115.00 cm/s AV Vmean:          82.500 cm/s AV VTI:            0.267 m AV Peak Grad:      5.3 mmHg AV Mean Grad:      3.0 mmHg LVOT Vmax:         91.00 cm/s LVOT Vmean:        69.700 cm/s LVOT VTI:          0.245 m LVOT/AV VTI ratio: 0.92  AORTA Ao Root diam: 2.90 cm Ao Asc diam:  3.40 cm MITRAL VALVE  TRICUSPID VALVE MV Area (PHT): 2.17 cm     TR Peak grad:   29.8 mmHg MV Decel Time: 349 msec     TR Vmax:        273.00 cm/s MV E velocity: 72.80 cm/s MV A velocity: 116.00 cm/s  SHUNTS MV E/A ratio:  0.63         Systemic VTI:  0.24 m                             Systemic Diam: 1.80 cm Vishnu Priya Mallipeddi Electronically signed by Lorelee Cover Mallipeddi Signature Date/Time: 04/26/2022/8:56:32 PM    Final    CT Angio Chest Pulmonary Embolism (PE) W or WO Contrast  Result Date: 04/26/2022 CLINICAL DATA:  Syncope, no other cardiac signs/symptoms EXAM: CT ANGIOGRAPHY CHEST WITH CONTRAST TECHNIQUE: Multidetector CT imaging of the chest was performed using the standard protocol during bolus administration of intravenous contrast. Multiplanar CT image reconstructions and MIPs were obtained to evaluate the vascular  anatomy. RADIATION DOSE REDUCTION: This exam was performed according to the departmental dose-optimization program which includes automated exposure control, adjustment of the mA and/or kV according to patient size and/or use of iterative reconstruction technique. CONTRAST:  43mL OMNIPAQUE IOHEXOL 350 MG/ML SOLN COMPARISON:  11/08/2020 and previous FINDINGS: Cardiovascular: SVC patent. Heart size normal. Trace pericardial fluid. Satisfactory opacification of pulmonary arteries noted, and there is no evidence of pulmonary emboli. Extensive coronary calcifications. Adequate contrast opacification of the thoracic aorta with no evidence of dissection, aneurysm, or stenosis. There is classic 3-vessel brachiocephalic arch anatomy without proximal stenosis. Scattered calcified plaque through the arch and descending thoracic aorta. Mediastinum/Nodes: 1.1 cm enlarged anterior mediastinal node, previously 1 cm. 1 cm subcarinal lymph node. Subcentimeter AP window and precarinal nodes appear more conspicuous. No mediastinal mass or hematoma. Lungs/Pleura: No pleural effusion. No pneumothorax. Pulmonary fibrosis with new airspace and ground-glass opacities throughout much of the left lower lobe, and to a lesser degree in the basilar segments of the right lower lobe. Upper Abdomen: Layering hyperdensities in the gallbladder suggesting small gallstones. No acute findings. Musculoskeletal: 4 cm medial left breast lesion with coarse calcifications as before. Mild spondylitic changes in the lower thoracic spine. Review of the MIP images confirms the above findings. IMPRESSION: 1. Negative for acute PE or thoracic aortic dissection. 2. Pulmonary fibrosis with new airspace and ground-glass opacities throughout much of the left lower lobe, and to a lesser degree in the basilar segments of the right lower lobe, suggesting pneumonia. 3. Mild mediastinal adenopathy, possibly reactive but nonspecific. 4. Cholelithiasis. 5. Coronary and   Aortic Atherosclerosis (ICD10-I70.0). Electronically Signed   By: Lucrezia Europe M.D.   On: 04/26/2022 10:34   CT HEAD WO CONTRAST (5MM)  Result Date: 04/26/2022 CLINICAL DATA:  Syncope EXAM: CT HEAD WITHOUT CONTRAST TECHNIQUE: Contiguous axial images were obtained from the base of the skull through the vertex without intravenous contrast. RADIATION DOSE REDUCTION: This exam was performed according to the departmental dose-optimization program which includes automated exposure control, adjustment of the mA and/or kV according to patient size and/or use of iterative reconstruction technique. COMPARISON:  02/17/2019 FINDINGS: Brain: There is no mass, hemorrhage or extra-axial collection. The size and configuration of the ventricles and extra-axial CSF spaces are normal. There is hypoattenuation of the white matter, most commonly indicating chronic small vessel disease. Vascular: Atherosclerotic calcification of the vertebral and internal carotid arteries at the skull base. No abnormal hyperdensity of  the major intracranial arteries or dural venous sinuses. Skull: The visualized skull base, calvarium and extracranial soft tissues are normal. Sinuses/Orbits: No fluid levels or advanced mucosal thickening of the visualized paranasal sinuses. No mastoid or middle ear effusion. The orbits are normal. IMPRESSION: 1. No acute intracranial abnormality. 2. Chronic small vessel disease. Electronically Signed   By: Ulyses Jarred M.D.   On: 04/26/2022 00:37   DG Chest 2 View  Result Date: 04/25/2022 CLINICAL DATA:  Worsening cough. Diarrhea and fatigue for 1 week. History of interstitial lung disease. EXAM: CHEST - 2 VIEW COMPARISON:  07/31/2021 FINDINGS: Lateral view degraded by patient arm position. Midline trachea. Normal heart size. Atherosclerosis in the transverse aorta. Diffuse interstitial thickening with more focal reticulation in the left-greater-than-right lower lobes, similar. Given differences in technique, no  evidence of acute superimposed lobar consolidation. IMPRESSION: Basilar predominant reticulation, likely related to history of interstitial lung disease. Similar to 07/31/2021. No acute superimposed process. Aortic Atherosclerosis (ICD10-I70.0). Electronically Signed   By: [name] M.D.   On: 04/25/2022 12:10     TODAY-DAY OF DISCHARGE:  Subjective:   [name] today has no headache,no chest abdominal pain,no new weakness tingling or numbness, feels much better wants to go home today.   Objective:   Blood pressure (!) 142/67, pulse 74, temperature 98.9 F (37.2 C), temperature source Oral, resp. rate 19, height 5\' 7"  (1.702 m), weight 64 kg, SpO2 95 %. No intake or output data in the 24 hours ending 04/27/22 0919 Filed Weights   04/25/22 1634 04/25/22 2307 04/26/22 0453  Weight: 68.9 kg 64 kg 64 kg    Exam: Awake Alert, Oriented *3, No new F.N deficits, Normal affect [place].AT,PERRAL Supple Neck,No JVD, No cervical lymphadenopathy appriciated.  Symmetrical Chest wall movement, Good air movement bilaterally, CTAB RRR,No Gallops,Rubs or new Murmurs, No Parasternal Heave +ve B.Sounds, Abd Soft, Non tender, No organomegaly appriciated, No rebound -guarding or rigidity. No Cyanosis, Clubbing or edema, No new Rash or bruise   PERTINENT RADIOLOGIC STUDIES: ECHOCARDIOGRAM COMPLETE  Result Date: 04/26/2022    ECHOCARDIOGRAM REPORT   Patient Name:   [name] Date of Exam: 04/26/2022 Medical Rec #:  [id number]      Height:       67.0 in Accession #:    [id number]     Weight:       141.1 lb Date of Birth:  08-12-1940       BSA:          1.744 m Patient Age:    68 years       BP: Patient Gender: F              HR: Exam Location:  Referring Phys: [place]  1. Left ventricular ejection fraction, by estimation, is 70 to 75%. The left ventricle has hyperdynamic function. The left ventricle has no regional wall motion abnormalities. There is moderate left ventricular  hypertrophy. Left ventricular diastolic parameters are consistent with Grade I diastolic dysfunction (impaired relaxation).  2. Right ventricular systolic function is normal. The right ventricular size is normal.  3. The mitral valve is abnormal. The tip of the anterior mitral valve leaflet is severely calcified. No evidence of mitral valve regurgitation. No evidence of mitral stenosis.  4. The aortic valve is tricuspid. Aortic valve regurgitation is not visualized. No aortic stenosis is present. Comparison(s): No significant change from prior study. FINDINGS  Left Ventricle: Left ventricular ejection fraction, by estimation, is 70 to  75%. The left ventricle has hyperdynamic function. The left ventricle has no regional wall motion abnormalities. The left ventricular internal cavity size was normal in size. There is moderate left ventricular hypertrophy. Left ventricular diastolic parameters are consistent with Grade I diastolic dysfunction (impaired relaxation). Right Ventricle: The right ventricular size is normal. No increase in right ventricular wall thickness. Right ventricular systolic function is normal. Left Atrium: Left atrial size was normal in size. Right Atrium: Right atrial size was normal in size. Pericardium: There is no evidence of pericardial effusion. Mitral Valve: The mitral valve is abnormal. There is severe calcification of the anterior mitral valve leaflet(s). No evidence of mitral valve regurgitation. No evidence of mitral valve stenosis. Tricuspid Valve: The tricuspid valve is not well visualized. Tricuspid valve regurgitation is not demonstrated. No evidence of tricuspid stenosis. Aortic Valve: The aortic valve is tricuspid. Aortic valve regurgitation is not visualized. No aortic stenosis is present. Aortic valve mean gradient measures 3.0 mmHg. Aortic valve peak gradient measures 5.3 mmHg. Aortic valve area, by VTI measures 2.34 cm. Pulmonic Valve: The pulmonic valve was not well  visualized. Pulmonic valve regurgitation is mild to moderate. No evidence of pulmonic stenosis. Aorta: The aortic root is normal in size and structure. Venous: The inferior vena cava was not well visualized. IAS/Shunts: The interatrial septum was not well visualized.  LEFT VENTRICLE PLAX 2D LVIDd:         3.30 cm     Diastology LVIDs:         1.90 cm     LV e' medial:    5.87 cm/s LV PW:         1.30 cm     LV E/e' medial:  12.4 LV IVS:        1.40 cm     LV e' lateral:   8.16 cm/s LVOT diam:     1.80 cm     LV E/e' lateral: 8.9 LV SV:         62 LV SV Index:   36 LVOT Area:     2.54 cm  LV Volumes (MOD) LV vol d, MOD A4C: 58.9 ml LV vol s, MOD A4C: 22.0 ml LV SV MOD A4C:     58.9 ml RIGHT VENTRICLE RV S prime:     18.90 cm/s TAPSE (M-mode): 2.1 cm LEFT ATRIUM             Index        RIGHT ATRIUM           Index LA Vol (A2C):   37.8 ml 21.68 ml/m  RA Area:     13.60 cm LA Vol (A4C):   42.8 ml 24.55 ml/m  RA Volume:   31.80 ml  18.24 ml/m LA Biplane Vol: 41.3 ml 23.69 ml/m  AORTIC VALVE AV Area (Vmax):    2.01 cm AV Area (Vmean):   2.15 cm AV Area (VTI):     2.34 cm AV Vmax:           115.00 cm/s AV Vmean:          82.500 cm/s AV VTI:            0.267 m AV Peak Grad:      5.3 mmHg AV Mean Grad:      3.0 mmHg LVOT Vmax:         91.00 cm/s LVOT Vmean:        69.700 cm/s LVOT VTI:  0.245 m LVOT/AV VTI ratio: 0.92  AORTA Ao Root diam: 2.90 cm Ao Asc diam:  3.40 cm MITRAL VALVE                TRICUSPID VALVE MV Area (PHT): 2.17 cm     TR Peak grad:   29.8 mmHg MV Decel Time: 349 msec     TR Vmax:        273.00 cm/s MV E velocity: 72.80 cm/s MV A velocity: 116.00 cm/s  SHUNTS MV E/A ratio:  0.63         Systemic VTI:  0.24 m                             Systemic Diam: 1.80 cm Vishnu Priya Mallipeddi Electronically signed by Lorelee Cover Mallipeddi Signature Date/Time: 04/26/2022/8:56:32 PM    Final    CT Angio Chest Pulmonary Embolism (PE) W or WO Contrast  Result Date: 04/26/2022 CLINICAL DATA:   Syncope, no other cardiac signs/symptoms EXAM: CT ANGIOGRAPHY CHEST WITH CONTRAST TECHNIQUE: Multidetector CT imaging of the chest was performed using the standard protocol during bolus administration of intravenous contrast. Multiplanar CT image reconstructions and MIPs were obtained to evaluate the vascular anatomy. RADIATION DOSE REDUCTION: This exam was performed according to the departmental dose-optimization program which includes automated exposure control, adjustment of the mA and/or kV according to patient size and/or use of iterative reconstruction technique. CONTRAST:  70mL OMNIPAQUE IOHEXOL 350 MG/ML SOLN COMPARISON:  11/08/2020 and previous FINDINGS: Cardiovascular: SVC patent. Heart size normal. Trace pericardial fluid. Satisfactory opacification of pulmonary arteries noted, and there is no evidence of pulmonary emboli. Extensive coronary calcifications. Adequate contrast opacification of the thoracic aorta with no evidence of dissection, aneurysm, or stenosis. There is classic 3-vessel brachiocephalic arch anatomy without proximal stenosis. Scattered calcified plaque through the arch and descending thoracic aorta. Mediastinum/Nodes: 1.1 cm enlarged anterior mediastinal node, previously 1 cm. 1 cm subcarinal lymph node. Subcentimeter AP window and precarinal nodes appear more conspicuous. No mediastinal mass or hematoma. Lungs/Pleura: No pleural effusion. No pneumothorax. Pulmonary fibrosis with new airspace and ground-glass opacities throughout much of the left lower lobe, and to a lesser degree in the basilar segments of the right lower lobe. Upper Abdomen: Layering hyperdensities in the gallbladder suggesting small gallstones. No acute findings. Musculoskeletal: 4 cm medial left breast lesion with coarse calcifications as before. Mild spondylitic changes in the lower thoracic spine. Review of the MIP images confirms the above findings. IMPRESSION: 1. Negative for acute PE or thoracic aortic  dissection. 2. Pulmonary fibrosis with new airspace and ground-glass opacities throughout much of the left lower lobe, and to a lesser degree in the basilar segments of the right lower lobe, suggesting pneumonia. 3. Mild mediastinal adenopathy, possibly reactive but nonspecific. 4. Cholelithiasis. 5. Coronary and  Aortic Atherosclerosis (ICD10-I70.0). Electronically Signed   By: Lucrezia Europe M.D.   On: 04/26/2022 10:34   CT HEAD WO CONTRAST (5MM)  Result Date: 04/26/2022 CLINICAL DATA:  Syncope EXAM: CT HEAD WITHOUT CONTRAST TECHNIQUE: Contiguous axial images were obtained from the base of the skull through the vertex without intravenous contrast. RADIATION DOSE REDUCTION: This exam was performed according to the departmental dose-optimization program which includes automated exposure control, adjustment of the mA and/or kV according to patient size and/or use of iterative reconstruction technique. COMPARISON:  02/17/2019 FINDINGS: Brain: There is no mass, hemorrhage or extra-axial collection. The size and configuration of the ventricles and extra-axial  CSF spaces are normal. There is hypoattenuation of the white matter, most commonly indicating chronic small vessel disease. Vascular: Atherosclerotic calcification of the vertebral and internal carotid arteries at the skull base. No abnormal hyperdensity of the major intracranial arteries or dural venous sinuses. Skull: The visualized skull base, calvarium and extracranial soft tissues are normal. Sinuses/Orbits: No fluid levels or advanced mucosal thickening of the visualized paranasal sinuses. No mastoid or middle ear effusion. The orbits are normal. IMPRESSION: 1. No acute intracranial abnormality. 2. Chronic small vessel disease. Electronically Signed   By: Ulyses Jarred M.D.   On: 04/26/2022 00:37   DG Chest 2 View  Result Date: 04/25/2022 CLINICAL DATA:  Worsening cough. Diarrhea and fatigue for 1 week. History of interstitial lung disease. EXAM: CHEST -  2 VIEW COMPARISON:  07/31/2021 FINDINGS: Lateral view degraded by patient arm position. Midline trachea. Normal heart size. Atherosclerosis in the transverse aorta. Diffuse interstitial thickening with more focal reticulation in the left-greater-than-right lower lobes, similar. Given differences in technique, no evidence of acute superimposed lobar consolidation. IMPRESSION: Basilar predominant reticulation, likely related to history of interstitial lung disease. Similar to 07/31/2021. No acute superimposed process. Aortic Atherosclerosis (ICD10-I70.0). Electronically Signed   By: [name] M.D.   On: 04/25/2022 12:10     PERTINENT LAB RESULTS: CBC: Recent Labs    04/25/22 2211 04/26/22 0045  WBC 5.3 5.4  HGB 10.7* 10.8*  HCT 31.3* 33.1*  PLT 207 203   CMET CMP     Component Value Date/Time   NA 131 (L) 04/26/2022 0045   NA 127 (L) 04/25/2022 1223   K 3.7 04/26/2022 0045   CL 101 04/26/2022 0045   CO2 22 04/26/2022 0045   GLUCOSE 86 04/26/2022 0045   BUN 11 04/26/2022 0045   BUN 13 04/25/2022 1223   CREATININE 1.10 (H) 04/26/2022 0045   CREATININE 1.02 (H) 05/07/2021 1345   CALCIUM 8.6 (L) 04/26/2022 0045   PROT 7.4 04/26/2022 0045   PROT 9.0 (H) 04/25/2022 1223   ALBUMIN 3.0 (L) 04/26/2022 0045   ALBUMIN 4.3 04/25/2022 1223   AST 18 04/26/2022 0045   AST 23 05/07/2021 1345   ALT 10 04/26/2022 0045   ALT 17 05/07/2021 1345   ALKPHOS 44 04/26/2022 0045   BILITOT 0.6 04/26/2022 0045   BILITOT 0.6 04/25/2022 1223   BILITOT 0.3 05/07/2021 1345   GFRNONAA 51 (L) 04/26/2022 0045   GFRNONAA 56 (L) 05/07/2021 1345   GFRAA 55 (L) 02/20/2019 0248    GFR Estimated Creatinine Clearance: 40.3 mL/min (A) (by C-G formula based on SCr of 1.1 mg/dL (H)). Recent Labs    04/25/22 1704  LIPASE 34   No results for input(s): "CKTOTAL", "CKMB", "CKMBINDEX", "TROPONINI" in the last 72 hours. Invalid input(s): "POCBNP" Recent Labs    04/25/22 2211  DDIMER 1.33*   Recent Labs     04/25/22 2211  HGBA1C 6.7*   No results for input(s): "CHOL", "HDL", "LDLCALC", "TRIG", "CHOLHDL", "LDLDIRECT" in the last 72 hours. Recent Labs    04/25/22 2211  TSH 3.005   No results for input(s): "VITAMINB12", "FOLATE", "FERRITIN", "TIBC", "IRON", "RETICCTPCT" in the last 72 hours. Coags: Recent Labs    04/25/22 1704  INR 1.2   Microbiology: Recent Results (from the past 240 hour(s))  SARS Coronavirus 2 by RT PCR (hospital order, performed in [facility] hospital lab) *cepheid single result test* Anterior Nasal Swab     Status: None   Collection Time: 04/25/22  5:02 PM  Specimen: Anterior Nasal Swab  Result Value Ref Range Status   SARS Coronavirus 2 by RT PCR NEGATIVE NEGATIVE Final    Comment: Performed at [facility], [address], [place], [place] [address]  Urine Culture     Status: Abnormal   Collection Time: 04/25/22  7:24 PM   Specimen: Urine, Clean Catch  Result Value Ref Range Status   Specimen Description URINE, CLEAN CATCH  Final   Special Requests   Final    NONE Performed at [facility], [place] [address] [place], [place] [address]    Culture >=100,000 COLONIES/mL ENTEROCOCCUS FAECALIS (A)  Final   Report Status 04/27/2022 FINAL  Final   Organism ID, Bacteria ENTEROCOCCUS FAECALIS (A)  Final      Susceptibility   Enterococcus faecalis - MIC*    AMPICILLIN <=2 SENSITIVE Sensitive     NITROFURANTOIN <=16 SENSITIVE Sensitive     VANCOMYCIN 1 SENSITIVE Sensitive     * >=100,000 COLONIES/mL ENTEROCOCCUS FAECALIS    FURTHER DISCHARGE INSTRUCTIONS:  Get Medicines reviewed and adjusted: Please take all your medications with you for your next visit with your Primary MD  Laboratory/radiological data: Please request your Primary MD to go over all hospital tests and procedure/radiological results at the follow up, please ask your Primary MD to get all Hospital records sent to his/her office.  In some cases, they will be blood work, cultures  and biopsy results pending at the time of your discharge. Please request that your primary care M.D. goes through all the records of your hospital data and follows up on these results.  Also Note the following: If you experience worsening of your admission symptoms, develop shortness of breath, life threatening emergency, suicidal or homicidal thoughts you must seek medical attention immediately by calling 911 or calling your MD immediately  if symptoms less severe.  You must read complete instructions/literature along with all the possible adverse reactions/side effects for all the Medicines you take and that have been prescribed to you. Take any new Medicines after you have completely understood and accpet all the possible adverse reactions/side effects.   Do not drive when taking Pain medications or sleeping medications (Benzodaizepines)  Do not take more than prescribed Pain, Sleep and Anxiety Medications. It is not advisable to combine anxiety,sleep and pain medications without talking with your primary care practitioner  Special Instructions: If you have smoked or chewed Tobacco  in the last 2 yrs please stop smoking, stop any regular Alcohol  and or any Recreational drug use.  Wear Seat belts while driving.  Please note: You were cared for by a hospitalist during your hospital stay. Once you are discharged, your primary care physician will handle any further medical issues. Please note that NO REFILLS for any discharge medications will be authorized once you are discharged, as it is imperative that you return to your primary care physician (or establish a relationship with a primary care physician if you do not have one) for your post hospital discharge needs so that they can reassess your need for medications and monitor your lab values.  Total Time spent coordinating discharge including counseling, education and face to face time equals greater than 30 minutes.  SignedOren Binet 04/27/2022 9:19 AM

## 2022-04-27 NOTE — Telephone Encounter (Signed)
Pls call to resched CT. PT gave wrong address to the Soc [employer] transportation and needs 2 days to resched a ride.   States she was in hospital and very confused w/all the appts and her illness.   Her number is [phone]

## 2022-04-27 NOTE — Evaluation (Addendum)
Physical Therapy Evaluation Patient Details Name: [name] MRN: [id number] DOB: [birth year] Today's Date: 04/27/2022  History of Present Illness  Patient is a 82 y/o female who presents on 3/30 to urgent care due to abdominal discomfort, fatigue and diarrhea, found to have UTI. Later that day presents to ED due to syncope/presyncope episode at a restaurant, with a brief episode of A-fib/A-flutter but back in NSR. PMH includes HTN, DM, lupus, hx of breast ca.  Clinical Impression  Patient presents with mild balance deficits, deconditioning and generalized weakness s/p above. Pt is home with family and reports being Mod I for ADLs at baseline as well as ambulation. Today, pt tolerated transfers and gait training with min guard-supervision for safety. Slow and guarded movements needing to stop and talk at times but no complete LOB. Encouraged using DME at home as needed temporarily. See orthostatic vitals below. Likely will progress well when home and able to increase activity. Will follow acutely to maximize independence and mobility prior to return home.     Supine BP 155/75, HR 71 bpm Sitting BP 109/73, HR 75 bpm Standing BP 114/63, HR 87 bpm Sitting BP post activity 128/76, pt asymptomatic throughout.   Recommendations for follow up therapy are one component of a multi-disciplinary discharge planning process, led by the attending physician.  Recommendations may be updated based on patient status, additional functional criteria and insurance authorization.  Follow Up Recommendations       Assistance Recommended at Discharge PRN  Patient can return home with the following  Assist for transportation;Assistance with cooking/housework;Help with stairs or ramp for entrance    Equipment Recommendations None recommended by PT  Recommendations for Other Services       Functional Status Assessment Patient has had a recent decline in their functional status and demonstrates the ability to make  significant improvements in function in a reasonable and predictable amount of time.     Precautions / Restrictions Precautions Precautions: Fall Restrictions Weight Bearing Restrictions: No      Mobility  Bed Mobility Overal bed mobility: Modified Independent             General bed mobility comments: No assist needed, assist with line management    Transfers Overall transfer level: Modified independent Equipment used: None               General transfer comment: Stood from EOB x1, from toilet x1 without difficulty. No dizziness.    Ambulation/Gait Ambulation/Gait assistance: Supervision Gait Distance (Feet): 200 Feet Assistive device: None Gait Pattern/deviations: Step-through pattern, Decreased stride length, Narrow base of support Gait velocity: decreased Gait velocity interpretation: <1.8 ft/sec, indicate of risk for recurrent falls   General Gait Details: Slow, guarded gait with short step lengths bilaterally. A few instances of stopping and talking.  Stairs            Wheelchair Mobility    Modified Rankin (Stroke Patients Only)       Balance Overall balance assessment: Needs assistance Sitting-balance support: Feet supported, No upper extremity supported Sitting balance-Leahy Scale: Good Sitting balance - Comments: Able to reach outside BoS and donn shoes without dififculty or LOB   Standing balance support: During functional activity Standing balance-Leahy Scale: Fair Standing balance comment: Able to stand at sink and manage pericare without difficulty, supervision                             Pertinent Vitals/Pain Pain Assessment  Pain Assessment: No/denies pain    Home Living Family/patient expects to be discharged to:: Private residence Living Arrangements: Children Available Help at Discharge: Family;Available 24 hours/day Type of Home: House Home Access: Stairs to enter Entrance Stairs-Rails: None Entrance  Stairs-Number of Steps: 1   Home Layout: Able to live on main level with bedroom/bathroom;Two level Home Equipment: Cane - single point;BSC/3in1;Rollator (4 wheels)      Prior Function Prior Level of Function : Independent/Modified Independent             Mobility Comments: independent, does some IADLS. Uses SPC PRN, no driving ADLs Comments: independent, does a sink bath as full bath is upstairs     Hand Dominance   Dominant Hand: Right    Extremity/Trunk Assessment   Upper Extremity Assessment Upper Extremity Assessment: Defer to OT evaluation    Lower Extremity Assessment Lower Extremity Assessment: Generalized weakness (but functional)    Cervical / Trunk Assessment Cervical / Trunk Assessment: Normal  Communication   Communication: HOH  Cognition Arousal/Alertness: Awake/alert Behavior During Therapy: WFL for tasks assessed/performed Overall Cognitive Status: Within Functional Limits for tasks assessed                                 General Comments: Some slow processing but seems appropriate for age/session        General Comments General comments (skin integrity, edema, etc.): Supine BP 155/75, HR 71 bpm, Sitting BP 109/73, HR 75 bpm, Standing BP 114/63, HR 87 bpm, Sitting BP post activity 128/76.    Exercises     Assessment/Plan    PT Assessment Patient needs continued PT services  PT Problem List Decreased strength;Decreased mobility;Decreased balance;Decreased activity tolerance       PT Treatment Interventions Therapeutic activities;Gait training;Therapeutic exercise;Patient/family education;Balance training;Functional mobility training    PT Goals (Current goals can be found in the Care Plan section)  Acute Rehab PT Goals Patient Stated Goal: to go home, go to my 82th  bday party on Saturday PT Goal Formulation: With patient Time For Goal Achievement: 05/11/22 Potential to Achieve Goals: Good    Frequency Min 3X/week      Co-evaluation               AM-PAC PT "6 Clicks" Mobility  Outcome Measure Help needed turning from your back to your side while in a flat bed without using bedrails?: None Help needed moving from lying on your back to sitting on the side of a flat bed without using bedrails?: None Help needed moving to and from a bed to a chair (including a wheelchair)?: A Little Help needed standing up from a chair using your arms (e.g., wheelchair or bedside chair)?: A Little Help needed to walk in hospital room?: A Little Help needed climbing 3-5 steps with a railing? : A Little 6 Click Score: 20    End of Session Equipment Utilized During Treatment: Gait belt Activity Tolerance: Patient tolerated treatment well Patient left: in bed;with call bell/phone within reach (sitting EOB) Nurse Communication: Mobility status PT Visit Diagnosis: Difficulty in walking, not elsewhere classified (R26.2);Muscle weakness (generalized) (M62.81);Unsteadiness on feet (R26.81)    Time: [id number] PT Time Calculation (min) (ACUTE ONLY): 31 min   Charges:   PT Evaluation $PT Eval Low Complexity: 1 Low PT Treatments $Gait Training: 8-22 mins        Marisa Severin, PT, DPT Acute Rehabilitation Services Secure chat preferred Office [phone]  Marguarite Arbour A Jeramie Scogin 04/27/2022, 9:31 AM

## 2022-04-28 ENCOUNTER — Telehealth: Payer: Self-pay | Admitting: Internal Medicine

## 2022-04-28 ENCOUNTER — Other Ambulatory Visit: Payer: 59

## 2022-04-28 NOTE — Telephone Encounter (Signed)
Pt daughter states her mom has a bad cough, with Sob, and was very weak. Other day they had a leak in her home w mold and wants to know if this can be a cause. Pt was release from the hospital yesterday  Pharmacy: [facility].

## 2022-04-29 NOTE — Telephone Encounter (Signed)
ATC patient. LVMTCB. 

## 2022-04-30 NOTE — Telephone Encounter (Signed)
PT's daughter calling again. Missed Ty's call yesterday. Please try again.  [phone]

## 2022-04-30 NOTE — Telephone Encounter (Signed)
Called and spoke with patient's daughter [name]. She stated that her mother was in the hospital recently for weakness and a UTI. She was discharged on 04/27/22 and has had a cough ever since. She was unable to tell me if she was coughing up any phlegm. She also mentioned that her SOB has increased as well.   She remembered that she had a prescription for Tessalon perles at [facility] so she filled these yesterday but she has not been able to feel a difference in her cough. She confirmed that she is still using her Duoneb solution as needed. She was discharged on amoxicillin 1000mg  twice daily for 3 days for the UTI.   She wanted to know if MR had any recommendations for her.   [facility]   MR, can you please advise? Thanks!

## 2022-05-05 NOTE — Telephone Encounter (Signed)
Patient aware of apmt for later this month,

## 2022-05-08 ENCOUNTER — Ambulatory Visit (HOSPITAL_COMMUNITY): Payer: 59 | Attending: Internal Medicine

## 2022-05-08 ENCOUNTER — Telehealth: Payer: Self-pay | Admitting: Internal Medicine

## 2022-05-08 DIAGNOSIS — I371 Nonrheumatic pulmonary valve insufficiency: Secondary | ICD-10-CM | POA: Diagnosis not present

## 2022-05-08 DIAGNOSIS — M359 Systemic involvement of connective tissue, unspecified: Secondary | ICD-10-CM | POA: Insufficient documentation

## 2022-05-08 DIAGNOSIS — J8489 Other specified interstitial pulmonary diseases: Secondary | ICD-10-CM

## 2022-05-08 DIAGNOSIS — J45909 Unspecified asthma, uncomplicated: Secondary | ICD-10-CM | POA: Diagnosis not present

## 2022-05-08 DIAGNOSIS — J84112 Idiopathic pulmonary fibrosis: Secondary | ICD-10-CM

## 2022-05-08 DIAGNOSIS — R55 Syncope and collapse: Secondary | ICD-10-CM | POA: Insufficient documentation

## 2022-05-08 DIAGNOSIS — R0600 Dyspnea, unspecified: Secondary | ICD-10-CM

## 2022-05-08 LAB — ECHOCARDIOGRAM COMPLETE
Area-P 1/2: 2.3 cm2
S' Lateral: 1.9 cm

## 2022-05-08 NOTE — Telephone Encounter (Signed)
Attempted to call pt but unable to reach. Left message to return call.  

## 2022-05-08 NOTE — Telephone Encounter (Signed)
   Restuls simimar to echo in march 2024 when in [place]. She should follow with cardiology   IMPRESSIONS     1. Left ventricular ejection fraction, by estimation, is 60 to 65%. The  left ventricle has normal function. The left ventricle has no regional  wall motion abnormalities. There is mild concentric left ventricular  hypertrophy. Left ventricular diastolic  parameters are consistent with Grade I diastolic dysfunction (impaired  relaxation). Elevated left ventricular end-diastolic pressure.   2. Right ventricular systolic function is normal. The right ventricular  size is normal.   3. There is severe calcification involving the anterior mitral valve  leaflet that extends into the chordae tendinae. The mitral valve is  degenerative. Trivial mitral valve regurgitation. No evidence of mitral  stenosis.   4. The aortic valve is normal in structure. Aortic valve regurgitation is  not visualized. No aortic stenosis is present.   5. 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

## 2022-05-11 NOTE — Telephone Encounter (Signed)
Mychart message sent to pt of the results. Will wait to make sure she has read the message prior to closing the encounter.

## 2022-05-14 NOTE — Telephone Encounter (Signed)
Pt has an upcoming visit with  Dr. Marchelle Gearing in May. Message has been sent to Dr. Marchelle Gearing for further recommendations.

## 2022-05-14 NOTE — Telephone Encounter (Signed)
Sounds like she needs to be seen in the office tomorrow or early next week.  If it certainly gets worse they are to go to the emergency department.  Sounds like she needs to be seen in the office tomorrow or early next week.  If it certainly gets worse they are to go to the emergency department.

## 2022-05-15 NOTE — Telephone Encounter (Signed)
Called and spoke with patients daughter Casimer Bilis. She verbalized understanding. Got patient scheduled to see Dr. Marchelle Gearing before her may appointment.   Nothing further needed.

## 2022-05-20 ENCOUNTER — Other Ambulatory Visit: Payer: 59

## 2022-05-25 ENCOUNTER — Inpatient Hospital Stay: Admission: RE | Admit: 2022-05-25 | Payer: 59 | Source: Ambulatory Visit

## 2022-05-26 ENCOUNTER — Ambulatory Visit: Payer: 59 | Admitting: Internal Medicine

## 2022-05-27 ENCOUNTER — Encounter: Payer: Self-pay | Admitting: Cardiology

## 2022-05-27 ENCOUNTER — Ambulatory Visit: Payer: 59 | Admitting: Cardiology

## 2022-05-27 VITALS — BP 178/82 | HR 64 | Ht 67.0 in | Wt 143.0 lb

## 2022-05-27 DIAGNOSIS — R053 Chronic cough: Secondary | ICD-10-CM

## 2022-05-27 DIAGNOSIS — I10 Essential (primary) hypertension: Secondary | ICD-10-CM

## 2022-05-27 DIAGNOSIS — J849 Interstitial pulmonary disease, unspecified: Secondary | ICD-10-CM

## 2022-05-27 DIAGNOSIS — N1831 Chronic kidney disease, stage 3a: Secondary | ICD-10-CM

## 2022-05-27 MED ORDER — SPIRONOLACTONE 25 MG PO TABS: 12.5000 mg | ORAL_TABLET | ORAL | 2 refills | Status: DC

## 2022-05-27 MED ORDER — AMLODIPINE BESYLATE 5 MG PO TABS: 5.0000 mg | ORAL_TABLET | Freq: Every evening | ORAL | 2 refills | Status: DC

## 2022-05-27 NOTE — Progress Notes (Signed)
Primary Physician/Referring:  Merri Brunette, MD  Patient ID: [name], female    DOB: [birth year], 82 y.o.   MRN: [id number]  Chief Complaint  Patient presents with   Dyspnea on exertion   Transitions Of Care   HPI:    [name]  is a 82 y.o. African-American female with history of diabetes mellitus with stage III chronic kidney disease, coronary calcification by CT and a negative nuclear stress test in 2018, SLE on plaquenil, history of remote breast cancer status post right breast lumpectomy sometime in 2015, admitted to the hospital with near syncope on 02/17/19 with hyponatremic, also possibly UTI, again admitted on 04/15/2022 with similar presentation of syncope, dehydration and UTI with Enterococcus, she was hydrated and discharged home.  She now presents for follow-up.  Except for continued cough, she has no specific complaints today.  She is frustrated about the cough.  She has not had any further dizziness or syncope.  Past Medical History:  Diagnosis Date   Breast cancer (HCC)    Diabetes mellitus without complication (HCC)    Hypertension    Hypothyroidism    Lupus (HCC)    Past Surgical History:  Procedure Laterality Date   BREAST LUMPECTOMY  2009   IR 3D INDEPENDENT WKST  05/29/2021   IR ANGIO INTRA EXTRACRAN SEL COM CAROTID INNOMINATE BILAT MOD SED  05/29/2021   IR ANGIO VERTEBRAL SEL VERTEBRAL BILAT MOD SED  05/29/2021   Family History  Problem Relation Age of Onset   Cancer Father        ? type   Asthma Brother     Social History   Tobacco Use   Smoking status: Never   Smokeless tobacco: Former    Types: Snuff    Quit date: 02/03/2015  Substance Use Topics   Alcohol use: No   Marital Status: Single  ROS  Review of Systems  Cardiovascular:  Positive for dyspnea on exertion and palpitations. Negative for chest pain and leg swelling.  Respiratory:  Positive for wheezing.   Musculoskeletal:  Positive for arthritis and back pain.  Gastrointestinal:   Negative for melena.   Objective  Blood pressure (!) 178/82, pulse 64, height 5\' 7"  (1.702 m), weight 143 lb (64.9 kg), SpO2 98 %.     05/27/2022   11:18 AM 05/27/2022   11:11 AM 04/27/2022    8:09 AM  Vitals with BMI  Height  5\' 7"    Weight  143 lbs   BMI  22.39   Systolic 178 177 846  Diastolic 82 79 67  Pulse 64 66 74     Physical Exam Vitals reviewed.  Neck:     Vascular: No carotid bruit or JVD.  Cardiovascular:     Rate and Rhythm: Normal rate and regular rhythm.     Pulses: Intact distal pulses.     Heart sounds: Normal heart sounds. No murmur heard.    No gallop.  Pulmonary:     Effort: Pulmonary effort is normal.     Breath sounds: Rales (coarse bilateral basal leathery crackles) present.  Musculoskeletal:     Cervical back: Neck supple.     Right lower leg: No edema.     Left lower leg: No edema.    Laboratory examination:   Recent Labs    04/25/22 1704 04/25/22 2211 04/26/22 0045  NA 126* 129* 131*  K 3.4* 3.4* 3.7  CL 97* 101 101  CO2 19* 19* 22  GLUCOSE 80 73 86  BUN 13 11 11   CREATININE 1.24* 1.02* 1.10*  CALCIUM 8.6* 8.2* 8.6*  GFRNONAA 44* 56* 51*      Latest Ref Rng & Units 04/26/2022   12:45 AM 04/25/2022   10:11 PM 04/25/2022    5:04 PM  CMP  Glucose 70 - 99 mg/dL 86  73  80   BUN 8 - 23 mg/dL 11  11  13    Creatinine 0.44 - 1.00 mg/dL 1.61  0.96  0.45   Sodium 135 - 145 mmol/L 131  129  126   Potassium 3.5 - 5.1 mmol/L 3.7  3.4  3.4   Chloride 98 - 111 mmol/L 101  101  97   CO2 22 - 32 mmol/L 22  19  19    Calcium 8.9 - 10.3 mg/dL 8.6  8.2  8.6   Total Protein 6.5 - 8.1 g/dL 7.4   8.0   Total Bilirubin 0.3 - 1.2 mg/dL 0.6   0.7   Alkaline Phos 38 - 126 U/L 44   46   AST 15 - 41 U/L 18   20   ALT 0 - 44 U/L 10   12       Latest Ref Rng & Units 04/26/2022   12:45 AM 04/25/2022   10:11 PM 04/25/2022    5:04 PM  CBC  WBC 4.0 - 10.5 K/uL 5.4  5.3  6.4   Hemoglobin 12.0 - 15.0 g/dL 40.9  81.1  91.4   Hematocrit 36.0 - 46.0 % 33.1  31.3   32.8   Platelets 150 - 400 K/uL 203  207  197    TSH Recent Labs    04/25/22 1702 04/25/22 2211  TSH 3.262 3.005    External labs:  Labs 06/25/2020:  A1c 7.5%.  Hb 11.3/HCT 33.7, platelets 234.  Normal indicis.  Serum glucose 122, BUN 13, creatinine 1.20, EGFR 46 mL, potassium 4.3, CMP otherwise normal.  Total cholesterol 104, triglycerides 113, HDL 37, LDL 67.  Non-HDL cholesterol 113.  Medications and allergies   Allergies  Allergen Reactions   Ciprofloxacin Itching, Swelling and Other (See Comments)    Possibly causing tremors?   Lisinopril Cough   Crestor [Rosuvastatin] Other (See Comments)    Myalgia and back pain   Ofev [Nintedanib] Nausea Only     Current Outpatient Medications:    albuterol (VENTOLIN HFA) 108 (90 Base) MCG/ACT inhaler, Inhale 2 puffs into the lungs every 4 (four) hours as needed for wheezing or shortness of breath., Disp: 18 g, Rfl: 6   amLODipine (NORVASC) 5 MG tablet, Take 1 tablet (5 mg total) by mouth every evening., Disp: 30 tablet, Rfl: 2   diclofenac Sodium (VOLTAREN) 1 % GEL, Apply 1 application. topically 4 (four) times daily as needed (pain)., Disp: , Rfl:    gabapentin (NEURONTIN) 300 MG capsule, Take 300 mg by mouth every 8 (eight) hours., Disp: , Rfl:    glimepiride (AMARYL) 1 MG tablet, Take 1 mg by mouth daily with breakfast., Disp: , Rfl:    hydroxychloroquine (PLAQUENIL) 200 MG tablet, Take 1 tablet (200 mg total) by mouth daily., Disp: 30 tablet, Rfl: 0   ibuprofen (ADVIL) 200 MG tablet, Take 200 mg by mouth every 6 (six) hours as needed for moderate pain or headache., Disp: , Rfl:    ipratropium-albuterol (DUONEB) 0.5-2.5 (3) MG/3ML SOLN, Take 3 mLs by nebulization every 6 (six) hours as needed., Disp: 360 mL, Rfl: 6   levothyroxine (SYNTHROID) 100 MCG tablet, Take 100 mcg  by mouth daily., Disp: , Rfl:    omeprazole (PRILOSEC) 20 MG capsule, Take 20 mg by mouth daily before breakfast., Disp: , Rfl:    spironolactone (ALDACTONE)  25 MG tablet, Take 0.5 tablets (12.5 mg total) by mouth every morning., Disp: 30 tablet, Rfl: 2   bisoprolol (ZEBETA) 5 MG tablet, Take 1 tablet (5 mg total) by mouth daily., Disp: 30 tablet, Rfl: 1  Radiology:   CT   chest 04/26/2022: 1. Negative for acute PE or thoracic aortic dissection. 2. Pulmonary fibrosis with new airspace and ground-glass opacities throughout much of the left lower lobe, and to a lesser degree in the basilar segments of the right lower lobe, suggesting pneumonia. 3. Mild mediastinal adenopathy, possibly reactive but nonspecific. 4. Cholelithiasis. 5. Coronary and  Aortic Atherosclerosis  Cardiac Studies:   Lexiscan myoview stress test 07/03/2016: 1. The resting electrocardiogram demonstrated normal sinus rhythm, normal resting conduction, PVC and normal rest repolarization. Stress EKG is non-diagnostic for ischemia as it a pharmacologic stress using Lexiscan. Stress symptoms included dyspnea. 2. Myocardial perfusion imaging is normal. Overall left ventricular systolic function was normal without regional wall motion abnormalities. The left ventricular ejection fraction was 75%.   Carotid artery duplex  02/18/2019: Right Carotid: Velocities in the right ICA are consistent with a 1-39% stenosis. Left Carotid: Velocities in the left ICA are consistent with a 1-39% stenosis. Vertebrals:  Bilateral vertebral arteries demonstrate antegrade flow. Subclavians: Normal flow hemodynamics were seen in bilateral subclavian arteries.  ECHOCARDIOGRAM COMPLETE 05/08/2022  1. Left ventricular ejection fraction, by estimation, is 60 to 65%. The left ventricle has normal function. The left ventricle has no regional wall motion abnormalities. There is mild concentric left ventricular hypertrophy. Left ventricular diastolic  parameters are consistent with Grade I diastolic dysfunction (impaired relaxation). Elevated left ventricular end-diastolic pressure.  2. Right ventricular  systolic function is normal. The right ventricular size is normal.  3. There is severe calcification involving the anterior mitral valve leaflet that extends into the chordae tendinae. The mitral valve is degenerative. Trivial mitral valve regurgitation. No evidence of mitral stenosis.  4. The aortic valve is normal in structure. Aortic valve regurgitation is not visualized. No aortic stenosis is present.  5. 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  EKG  EKG 05/27/2022: Sinus bradycardia at rate of 59 bpm, leftward axis.  Poor R progression, probably normal variant.  Nonspecific T abnormality.  Compared to 04/17/2021, no significant change.  Previous heart rate was 71 bpm.  Assessment     ICD-10-CM   1. Primary hypertension  I10 EKG 12-Lead    spironolactone (ALDACTONE) 25 MG tablet    amLODipine (NORVASC) 5 MG tablet    Basic metabolic panel    2. Chronic cough  R05.3 Basic metabolic panel    3. ILD (interstitial lung disease) (HCC)  J84.9     4. Stage 3a chronic kidney disease (HCC)  N18.31       Medications Discontinued During This Encounter  Medication Reason   aspirin EC 81 MG tablet Patient Preference   ferrous sulfate 325 (65 FE) MG tablet Patient Preference   fluticasone (FLONASE) 50 MCG/ACT nasal spray Patient Preference   OVER THE COUNTER MEDICATION Patient Preference   lisinopril (ZESTRIL) 10 MG tablet Allergic reaction    Meds ordered this encounter  Medications   spironolactone (ALDACTONE) 25 MG tablet    Sig: Take 0.5 tablets (12.5 mg total) by mouth every morning.    Dispense:  30 tablet    Refill:  2    Discontinue lisinopril allergy cough   amLODipine (NORVASC) 5 MG tablet    Sig: Take 1 tablet (5 mg total) by mouth every evening.    Dispense:  30 tablet    Refill:  2   Recommendations:   [name] is a 82 y.o. African-American female with history of diabetes mellitus with stage III chronic kidney disease, coronary calcification by CT and a negative  nuclear stress test in 2018, SLE on plaquenil, history of remote breast cancer status post right breast lumpectomy sometime in 2015, admitted to the hospital with near syncope on 02/17/19 with hyponatremic, also possibly UTI, again admitted on 04/15/2022 with similar presentation of syncope, dehydration and UTI with Enterococcus, she was hydrated and discharged home.  She now presents for follow-up.  1. Primary hypertension Blood pressure is elevated today again, I have started her on amlodipine 5 mg daily.  She will also start with spironolactone 12.5 mg in the morning.  Obtain BMP in 2 weeks. Office visit in 6 weeks for follow-up.  - EKG 12-Lead - spironolactone (ALDACTONE) 25 MG tablet; Take 0.5 tablets (12.5 mg total) by mouth every morning.  Dispense: 30 tablet; Refill: 2 - amLODipine (NORVASC) 5 MG tablet; Take 1 tablet (5 mg total) by mouth every evening.  Dispense: 30 tablet; Refill: 2 - Basic metabolic panel  2. Chronic cough Chronic cough could be related to lisinopril but she also has ILD.  This is a dry irritating cough, I truly believe that stopping lisinopril can be appropriate.  Will see how she does following discontinuation and starting spironolactone.  - Basic metabolic panel  3. ILD (interstitial lung disease) (HCC) ILD has remained stable, follows Dr. Marchelle Gearing.  She may benefit from being on oxygen, however due to frequent cough she has not been able to use this.  She can revisit this if cough resolves by stopping lisinopril.  4. Stage 3a chronic kidney disease (HCC) Stage IIIa chronic kidney disease needs close monitoring since initiation of therapy, BMP has been ordered.  I reviewed her chart from the hospitalization, her syncope was clearly related to dehydration and UTI.  This is the second episode that she has had syncope and UTI.  She may need to be further evaluated for frequent urinary tract infection.  Also the fact that she had questionable atrial fibrillation is  probably not consequential in the absence of sustained atrial fibrillation no further evaluation is indicated.  She was found to have elevated heart rate when the EMS arrived at her house when she had syncope.  Updated all her charts and labs.    Yates Decamp, MD, [facility] 05/27/2022, 12:00 PM Office: [phone] Fax: [phone] Pager: [phone]      CC Dr. Harriett Sine, Dr. Merri Brunette

## 2022-05-27 NOTE — Patient Instructions (Signed)
Please go to [facility] in 2 to 3 weeks, no fasting needed to get blood work done.

## 2022-06-01 ENCOUNTER — Ambulatory Visit (INDEPENDENT_AMBULATORY_CARE_PROVIDER_SITE_OTHER): Payer: 59

## 2022-06-01 ENCOUNTER — Ambulatory Visit (INDEPENDENT_AMBULATORY_CARE_PROVIDER_SITE_OTHER): Payer: 59 | Admitting: Internal Medicine

## 2022-06-01 ENCOUNTER — Encounter: Payer: Self-pay | Admitting: Internal Medicine

## 2022-06-01 VITALS — BP 114/60 | HR 78 | Ht 67.0 in | Wt 142.4 lb

## 2022-06-01 DIAGNOSIS — J84112 Idiopathic pulmonary fibrosis: Secondary | ICD-10-CM

## 2022-06-01 DIAGNOSIS — R768 Other specified abnormal immunological findings in serum: Secondary | ICD-10-CM | POA: Diagnosis not present

## 2022-06-01 DIAGNOSIS — J8489 Other specified interstitial pulmonary diseases: Secondary | ICD-10-CM

## 2022-06-01 DIAGNOSIS — M359 Systemic involvement of connective tissue, unspecified: Secondary | ICD-10-CM

## 2022-06-01 DIAGNOSIS — M329 Systemic lupus erythematosus, unspecified: Secondary | ICD-10-CM

## 2022-06-01 DIAGNOSIS — R0609 Other forms of dyspnea: Secondary | ICD-10-CM | POA: Diagnosis not present

## 2022-06-01 LAB — HEPATIC FUNCTION PANEL
ALT: 8 U/L (ref 0–35)
AST: 17 U/L (ref 0–37)
Albumin: 4.1 g/dL (ref 3.5–5.2)
Alkaline Phosphatase: 57 U/L (ref 39–117)
Bilirubin, Direct: 0.1 mg/dL (ref 0.0–0.3)
Total Bilirubin: 0.4 mg/dL (ref 0.2–1.2)
Total Protein: 8.9 g/dL — ABNORMAL HIGH (ref 6.0–8.3)

## 2022-06-01 LAB — CBC WITH DIFFERENTIAL/PLATELET
Basophils Absolute: 0 10*3/uL (ref 0.0–0.1)
Basophils Relative: 0.6 % (ref 0.0–3.0)
Eosinophils Absolute: 0.1 10*3/uL (ref 0.0–0.7)
Eosinophils Relative: 1.6 % (ref 0.0–5.0)
HCT: 36.9 % (ref 36.0–46.0)
Hemoglobin: 12.1 g/dL (ref 12.0–15.0)
Lymphocytes Relative: 42.6 % (ref 12.0–46.0)
Lymphs Abs: 2.7 10*3/uL (ref 0.7–4.0)
MCHC: 32.9 g/dL (ref 30.0–36.0)
MCV: 89.4 fl (ref 78.0–100.0)
Monocytes Absolute: 0.4 10*3/uL (ref 0.1–1.0)
Monocytes Relative: 6.7 % (ref 3.0–12.0)
Neutro Abs: 3 10*3/uL (ref 1.4–7.7)
Neutrophils Relative %: 48.5 % (ref 43.0–77.0)
Platelets: 274 10*3/uL (ref 150.0–400.0)
RBC: 4.12 Mil/uL (ref 3.87–5.11)
RDW: 15.8 % — ABNORMAL HIGH (ref 11.5–15.5)
WBC: 6.2 10*3/uL (ref 4.0–10.5)

## 2022-06-01 LAB — BRAIN NATRIURETIC PEPTIDE: Pro B Natriuretic peptide (BNP): 97 pg/mL (ref 0.0–100.0)

## 2022-06-01 LAB — BASIC METABOLIC PANEL
BUN: 7 mg/dL (ref 6–23)
CO2: 27 mEq/L (ref 19–32)
Calcium: 9.7 mg/dL (ref 8.4–10.5)
Chloride: 100 mEq/L (ref 96–112)
Creatinine, Ser: 1 mg/dL (ref 0.40–1.20)
GFR: 53.37 mL/min — ABNORMAL LOW (ref >60.00)
Glucose, Bld: 64 mg/dL — ABNORMAL LOW (ref 70–99)
Potassium: 3.8 mEq/L (ref 3.5–5.1)
Sodium: 134 mEq/L — ABNORMAL LOW (ref 135–145)

## 2022-06-01 NOTE — Progress Notes (Signed)
I:06/03/2021 -   82 year old female, never smoked.  Past medical history significant for hypertension, ILD, hypothyroidism, history of asthma, systemic lupus.  Patient Dr. Marchelle [name], last seen in office on 07/03/2021 by Dr. Thora [name] for community-acquired pneumonia.        Chief Complaint  Patient presents with   Follow-up      PFT performed today.  Pt states she has been doing okay since last visit and denies any real complaints. States that every so often she will become SOB.   HPI [name] 81 y.o. -returns for follow-up.  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  However symptomatically she was stable.  Therefore we decided to see her again with pulmonary function test.  Pulmonary function test shows continued stability [see below].  Symptom wise she is also stable without any change.  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  However in the 2021 European respiratory general review (https://err.ersjournals.com/content/30/162/210177#T1)  -UIP is excluded from this classification unless there is specific clinical and serologic features.  She does have serologic factors of positive SSA and positive ANA at 1: 320.  However she does not have specific clinical features particularly of Raynaud's or digital tip ulceration or digital tip fissuring or palmar telangiectasia early morning joint stiffness or Raynaud's or unexplained digital edema Gottron sign.  In talking to her she says her rheumatologist is not Dr. Kathi [name].  She is not sure if she has a specific autoimmune diagnosis.  But she does follow-up.  Last visit was March 2023.  I do not have the notes with me.  In the past she has been labeled as lupus before she moved to [place].  Within rheumatologist Dr. Nickola [name] her previous rheumatologist did not think she had any specific connective tissue  disease.  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  Therefore we discussed antifibrotic therapy again.  Explained UIP is by prognostic marker and the preventative role of antifibrotic's.  Explained side effects.  She is not sure she wants to do it and she wants to reflect on it.  We discussed potential clinical trials and she wants to reflect on this as well.   07/03/21- Dr. Thora [name]     Chief Complaint  Patient presents with   Acute Visit      Increased SOB and cough x 2 wks. Went to ED 07/02/21- dx with right infiltrate and prescribed doxy but she is hesitant about taking due to her lupus. Her cough has been prod with white sputum.     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 Marchelle [name] and considering antifibrotic, diastolic dysfunction, DM, breast cancer, hypothyroid.   Cough nonstop over last couple of weeks, increased dyspnea. Cough is still productive, pleuritic pain. Seen in urgent care 6/1 (given 3d prednisone which per pt was not helpful), 6/5 for cough (given tessalon perles for presumed postviral cough), nasal congestion. Seen in [facility] ED yesterday given doxy for CAP. Covid negative. No fever, hemoptysis.    Otherwise pertinent review of systems is negative.    07/31/2021- Interim hx  Patient presents today for 1 month follow-up for CAP.   She was treated with Augmentin and azithromycin. She is doing well today. Feels better.  Shortness of breath, cough and wheezing have resolved. She has chronic dyspnea with moderate exertion. No shortness of breath at rest or with simple ADLs. She has a rare dry cough. No oxygen requirements.: OV with Kathryn Ridges NP for follow-up.  She had been seen by Dr. Daphane Shepherd 07/03/2021 and diagnosed with CAP.  She was treated with Augmentin and azithromycin.  Feeling better.  Shortness of breath, cough and wheezing have resolved.  She has chronic dyspnea with moderate exertion.   No increase in baseline.  No oxygen requirements.  Currently under surveillance not on Ofev.  Will follow-up with Dr. Marchelle [name] in October and discuss restarting.  Patient ended up contacting the office on 8/23 and wanted to go ahead and move forward with restarting antifibrotic therapy - restarted Ofev.   12/17/2021: Today-acute visit Patient presents today for acute visit via virtual visit with her daughter.  A few people in her family tested positive for RSV around 2 weeks ago.  She started having similar symptoms with cough, fatigue, headaches and sinus congestion.  Her symptoms have improved some but she continues to have a productive cough with yellow phlegm and feels like she is having a lot of chest congestion.  She also feels slightly more short of breath compared to her baseline.  Still having some headaches and nasal congestion.  Denies any fevers, chills, hemoptysis, lower extremity swelling.  She is eating and drinking well.  Has not had any GI symptoms.  Has not been monitoring her oxygen levels.  She did go to urgent care on 11/11.  They swabbed her for COVID and flu which were both negative.  Did not perform any RSV testing.  No imaging was obtained.  She was discharged home with supportive care measures.   OV 01/13/2022  Subjective:  Patient ID: [name], female , DOB: Feb 08, 1940 , age 66 y.o. , MRN: [id number] , ADDRESS: [address] [place] [address] PCP Merri Brunette, MD Patient Care Team: Merri Brunette, MD as PCP - General (Family Medicine) Zenovia Jordan, MD as Consulting Physician (Rheumatology)  This Provider for this visit: Treatment Team:  Attending Provider: Kalman Shan, MD  01/13/2022 -   Chief Complaint  Patient presents with   Follow-up    PFT performed today.  Pt states she is about the same since last visit. States she has been sick twice recently.     HPI [name] 82 y.o. -returns for follow-up.  After my last visit she did  call in and said that she was open to taking antifibrotic's.  We started this in October 2023.  However she took only for few days and then stopped because of abdominal pain and nausea and other GI side effects.  Her daughter Casimer Bilis advised her also not to take the medicines after reading the side effects.  She tells me today that she does not want to go back on taking nintedanib.She tells me overall she is stable for the year of 2023.  Daughter wanted know what other options there.  We discussed pirfenidone.  Daughter was interested in patient taking pirfenidone but patient rejected this idea because of significant GI side effects even with pirfenidone.  Daughter was also interested in knowing about other possible therapeutic options.  Explained to her that only nintedanib and pirfenidone exist.  Everything else outside this would be a clinical trial.  There are some upcoming trials with inhaled pirfenidone and other agents.  Discussed the concept of clinical trials.  They are processing  this information.     Daughter was interested in knowing the natural history of pulmonary fibrosis and the future prognosis.  Did explain progressive phenotype and the likelihood of ending up on oxygen class III-4 dyspnea in the future.   Of note the daughter states she got exposed to kids with RSV and has been fatigued this was right before Thanksgiving 2023.  Currently patient feels fine but the daughter feels she is more fatigued.  Walking desaturation test shows that she is more fatigued and she cannot do her 3 labs but there is no change in the heart rate n or pulse ox compared to the past.  She denies any fever or chills or sputum production or worsening cough.      OV 03/26/2022  Subjective:  Patient ID: [name], female , DOB: 12/29/1940 , age 82 y.o. , MRN: [id number] , ADDRESS: [address] [place] [address] PCP Merri Brunette, MD Patient Care Team: Merri Brunette, MD as PCP - General  (Family Medicine) Zenovia Jordan, MD as Consulting Physician (Rheumatology)  This Provider for this visit: Treatment Team:  Attending Provider: Kalman Shan, MD   Chief Complaint  Patient presents with   Follow-up    SOB seems worse.  Hard to breathe at times.  Needs albuterol MDI.      HPI [name] 82 y.o. -returns for follow-up for the above issue.  Initially she said she was doing stable but I think continue to talk to her she said that she might be feeling a little more short of breath on exertion relieved by rest than before.  She then said that was just a single episode 2 weeks ago when she walked to the truck she got quite short of breath and had to stop but then it resolved and she feels baseline but in evaluation of the symptom score sheet her symptoms are much worse than previous visit in December 2023.  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  And she had previously failed nintedanib and she already has slowly progressive disease.  The technician CMA walked her today for single pulse oximetry she was not sure about the accuracy and the reading but it appears patient might be desaturating.  Her last CT scan was in fall 2022.  She was supposed to have a CT scan at this visit but I am unclear why this has not happened and patient's not sure either.  Patient does have does not want to have pulmonary function test.  Her last echocardiogram was also in September 2022.  She is at risk candidate for pulmonary hypertension.  She denies any fever or chills or nausea vomiting or cough or wheezing no changes in medicines no new medical issues.  She also has baseline anemia and this has not been rechecked in a while.  Based on test review.   CT Chest data - HRCT OCt 2022  Narrative & Impression  CLINICAL DATA:  Interstitial lung disease, dyspnea on exertion, history of left breast cancer and radiation   EXAM: CT CHEST WITHOUT CONTRAST    TECHNIQUE: Multidetector CT imaging of the chest was performed following the standard protocol without intravenous contrast. High resolution imaging of the lungs, as well as inspiratory and expiratory imaging, was performed.   COMPARISON:  07/27/2019 02/11/2018, 02/15/2017, 07/22/2016   FINDINGS: Cardiovascular: Aortic atherosclerosis. Normal heart size. Three-vessel coronary artery calcifications. No pericardial effusion.   Mediastinum/Nodes: No enlarged mediastinal, hilar, or axillary lymph  nodes. Small hiatal hernia. Thyroid gland, trachea, and esophagus demonstrate no significant findings.   Lungs/Pleura: Redemonstrated moderate pulmonary fibrosis in a pattern with apical to basal gradient, featuring irregular peripheral interstitial opacity, septal thickening, traction bronchiectasis, subpleural bronchiolectasis, and areas of honeycombing at the lung bases. Fibrotic findings are slightly worsened compared to prior examination dated 07/27/2019 and clearly worsened over time on examinations dating back to 07/22/2016. No significant air trapping on expiratory phase imaging. No pleural effusion or pneumothorax.   Upper Abdomen: No acute abnormality.   Musculoskeletal: Calcified mass of the medial left breast, unchanged (series 2, image 89). No suspicious bone lesions identified.   IMPRESSION: 1. Redemonstrated moderate pulmonary fibrosis in a pattern with apical to basal gradient, featuring irregular peripheral interstitial opacity, septal thickening, traction bronchiectasis, subpleural bronchiolectasis, and areas of honeycombing at the lung bases. Fibrotic findings are slightly worsened compared to prior examination dated 07/27/2019 and clearly worsened over time on examinations dating back to 07/22/2016. Findings are consistent with UIP per consensus guidelines: Diagnosis of Idiopathic Pulmonary Fibrosis: An Official ATS/ERS/JRS/ALAT Clinical Practice Guideline. Am Rosezetta Schlatter Crit Care Med Vol 198, Iss 5, [phone], Sep 26 2016. 2. Coronary artery disease.   Aortic Atherosclerosis (ICD10-I70.0).     Electronically Signed   By: Jearld Lesch M.D.   On: 11/11/2020 09:12     OV 06/01/2022  Subjective:  Patient ID: [name], female , DOB: Apr 09, 1940 , age 33 y.o. , MRN: [id number] , ADDRESS: [address] [place] [address] PCP Merri Brunette, MD Patient Care Team: Merri Brunette, MD as PCP - General (Family Medicine) Zenovia Jordan, MD as Consulting Physician (Rheumatology)  This Provider for this visit: Treatment Team:  Attending Provider: Kalman Shan, MD    06/01/2022 -   Chief Complaint  Patient presents with   Follow-up    F/up on Echo, HRCT. HRCT not done.      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 Kathi [name] for lupus and is on Plaquenil.  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 Normal PA/RV and LV             - similar sept 2022     03/26/2022 -    HPI [name] 82 y.o. -returns for follow-up.9 history is gained from talking to the patient but later also from talking to the daughter.  At first the patient states she was feeling great.  She says after her recent hospitalization late March 2024 through early April 2024 for dehydration related to UTI and presyncope she is actually doing better.  She is going dancing.  Her shortness of breath is actually improved and energy levels are better.)  But later when I  called the daughter the daughter said the  whole reason the appointment was made with [place] because she is having worsening cough.  Then the patient later admitted the cough is indeed worse and she has some clear sputum but the patient also said that the cough got worse during the hospital stay [CT suggested some pneumonia] but is currently improving and getting better.  Daughter denied cough is better. Denied dysphagia. There was some mold exposure in the houseThere is no edema there is no orthopnea no paroxysmal nocturnal dyspnea.  Admitted to a sit/stand test x 12 times.  She did not get dyspneic and she did not desaturate.  The results are below.  She was supposed to have a high-resolution CT chest but this did not happen because of the hospitalization and her having a CT angio chest.  Will proceed today and get chest x-ray and labs  At this point in time I think symptoms are stable exercise hypoxemia is absent.  I think it is best we continue to monitor ILD before committing to antifibrotic's particularly given her age and previous intolerance to nintedanib and having chronic kidney disease.     SYMPTOM SCALE - ILD 06/14/2019   08/17/2019   05/07/2020   02/14/2021   06/03/2021   07/31/2021  01/13/2022  03/26/2022  06/01/2022   O2 use ra ra ra ra ra RA ra ra ra  Shortness of Breath 0 -> 5 scale with 5 being worst (score 6 If unable to do)              At rest 0 2 0 0 2 0 0 2 0  Simple tasks - showers, clothes change, eating, shaving 1 2 1 4 1 1 1 4  0  Household (dishes, doing bed, laundry) 3 3 4 4 1  0 2 4 2   Shopping 5 4 5 5 3 5 3 4 2   Walking level at own pace 3 3 3 3 2  0 2 3 1   Walking up Stairs 5 5 4 5 5 5 5 5  0  Total (30-36) Dyspnea Score 17 19 17 21 14 11 13 22 5   How bad is your cough? 2   1 0 0 1 1 0 5  How bad is your fatigue 3   3 3 1 2 3  0 3  How bad is nausea 00   0 0 0 0 0 0 0  How bad is vomiting?   0   0 0 0 0 0 0 0  How bad is diarrhea? 0   0 0 0 0 0 0 0  How bad is anxiety? 0 0  0 2 0 3 0 0 1  How bad is depression 0 1 0 3 0 1 0 1 0       Simple office walk 185 feet x  3 laps goal with forehead probe 08/18/2017       02/15/2018   06/14/2019   05/07/2020   02/14/2021   06/03/21   07/31/2021  01/13/2022  06/01/2022   O2 used *room air Room air ra ra ra   RA ra ra  Number laps completed 3 3 3 3 3   3 2  of 3 Sit stand x 12 times  Comments about pace Normal pace Slow pace Slow pace slow Slow pace   Slow pace  Slow pace   Resting Pulse Ox/HR 100% and 67/min 100% and 73/min 100% and 63/mi 100% and 67/min 100% and 64   100% and 71 100% and HR 68 98% ad  HR 88  Final Pulse Ox/HR 100% and 91/min 100% and 96/min 92% and 84/mi 100% and 91/ 100% and 91   98% and 88 98% and HR 92 96% and HR 86  Desaturated </= 88% no No  no no no   No no   Desaturated <= 3% points no no Yues, 8 ponts no no   No no   Got Tachycardic >/= 90/min yes yes no yes yes   No no   Symptoms at end of test Mild dyspnea Mild dyspnea Mild dyspnea Mild dyspnea Mild dyspnea   Mild dyspnea Stopped early due to fatigue No dyspnea  Miscellaneous comments x x     x   x       ECHO 05/08/22   IMPRESSIONS     1. Left ventricular ejection fraction, by estimation, is 60 to 65%. The  left ventricle has normal function. The left ventricle has no regional  wall motion abnormalities. There is mild concentric left ventricular  hypertrophy. Left ventricular diastolic  parameters are consistent with Grade I diastolic dysfunction (impaired  relaxation). Elevated left ventricular end-diastolic pressure.   2. Right ventricular systolic function is normal. The right ventricular  size is normal.   3. There is severe calcification involving the anterior mitral valve  leaflet that extends into the chordae tendinae. The mitral valve is  degenerative. Trivial mitral valve regurgitation. No evidence of mitral  stenosis.   4. The aortic valve is normal in structure. Aortic valve regurgitation is  not visualized. No aortic  stenosis is present.   5. 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   PFT     Latest Ref Rng & Units 01/13/2022   12:32 PM 06/03/2021   10:37 AM 02/14/2021   12:36 PM 05/18/2017   12:44 PM  PFT Results  FVC-Pre L  1.90  1.84  1.61   FVC-Predicted Pre %  80  76  63   Pre FEV1/FVC % %  82  84  93   FEV1-Pre L  1.56  1.54  1.51   FEV1-Predicted Pre %  84  82  76   DLCO uncorrected ml/min/mmHg 18.06  9.75  9.78    DLCO UNC% % 87  47  47    DLCO corrected ml/min/mmHg 18.06  10.11  9.78    DLCO COR %Predicted % 87  48  47    DLVA Predicted % 91  76  70    TLC L 4.89      TLC % Predicted % 89      RV % Predicted % 107        CT March 2024  MPRESSION: 1. Negative for acute PE or thoracic aortic dissection. 2. Pulmonary fibrosis with new airspace and ground-glass opacities throughout much of the left lower lobe, and to a lesser degree in the basilar segments of the right lower lobe, suggesting pneumonia. 3. Mild mediastinal adenopathy, possibly reactive but nonspecific. 4. Cholelithiasis. 5. Coronary and  Aortic Atherosclerosis (ICD10-I70.0).     Electronically Signed   By: Corlis Leak M.D.   On: 04/26/2022 10:34    has a past medical history of Breast cancer (HCC), Diabetes mellitus without complication (HCC), Hypertension, Hypothyroidism, and Lupus (HCC).   reports that she has never smoked. She quit smokeless tobacco use about 7 years ago.  Her smokeless tobacco use included snuff.  Past Surgical History:  Procedure Laterality Date   BREAST LUMPECTOMY  2009   IR 3D INDEPENDENT WKST  05/29/2021  IR ANGIO INTRA EXTRACRAN SEL COM CAROTID INNOMINATE BILAT MOD SED  05/29/2021   IR ANGIO VERTEBRAL SEL VERTEBRAL BILAT MOD SED  05/29/2021    Allergies  Allergen Reactions   Ciprofloxacin Itching, Swelling and Other (See Comments)    Possibly causing tremors?   Lisinopril Cough   Crestor [Rosuvastatin] Other (See Comments)    Myalgia and back pain   Ofev [Nintedanib] Nausea Only     Immunization History  Administered Date(s) Administered   Influenza, High Dose Seasonal PF 11/09/2016, 10/27/2018, 10/26/2020   Influenza,inj,Quad PF,6+ Mos 10/27/2015   PFIZER(Purple Top)SARS-COV-2 Vaccination 04/22/2019, 05/13/2019, 01/15/2020   Pneumococcal Conjugate-13 07/11/2013, 02/15/2018   Pneumococcal Polysaccharide-23 03/18/2020   Tdap 03/20/2021    Family History  Problem Relation Age of Onset   Cancer Father        ? type   Asthma Brother      Current Outpatient Medications:    albuterol (VENTOLIN HFA) 108 (90 Base) MCG/ACT inhaler, Inhale 2 puffs into the lungs every 4 (four) hours as needed for wheezing or shortness of breath., Disp: 18 g, Rfl: 6   amLODipine (NORVASC) 5 MG tablet, Take 1 tablet (5 mg total) by mouth every evening., Disp: 30 tablet, Rfl: 2   bisoprolol (ZEBETA) 5 MG tablet, Take 1 tablet (5 mg total) by mouth daily., Disp: 30 tablet, Rfl: 1   diclofenac Sodium (VOLTAREN) 1 % GEL, Apply 1 application. topically 4 (four) times daily as needed (pain)., Disp: , Rfl:    gabapentin (NEURONTIN) 300 MG capsule, Take 300 mg by mouth every 8 (eight) hours., Disp: , Rfl:    glimepiride (AMARYL) 1 MG tablet, Take 1 mg by mouth daily with breakfast., Disp: , Rfl:    hydroxychloroquine (PLAQUENIL) 200 MG tablet, Take 1 tablet (200 mg total) by mouth daily., Disp: 30 tablet, Rfl: 0   ibuprofen (ADVIL) 200 MG tablet, Take 200 mg by mouth every 6 (six) hours as needed for moderate pain or headache., Disp: , Rfl:    ipratropium-albuterol (DUONEB) 0.5-2.5 (3) MG/3ML SOLN, Take 3 mLs by nebulization every 6 (six) hours as needed., Disp: 360 mL, Rfl: 6   levothyroxine (SYNTHROID) 100 MCG tablet, Take 100 mcg by mouth daily., Disp: , Rfl:    omeprazole (PRILOSEC) 20 MG capsule, Take 20 mg by mouth daily before breakfast., Disp: , Rfl:    spironolactone (ALDACTONE) 25 MG tablet, Take 0.5 tablets (12.5 mg total) by mouth every morning., Disp: 30 tablet, Rfl: 2       Objective:   Vitals:   06/01/22 1430  BP: 114/60  Pulse: 78  SpO2: 97%  Weight: 142 lb 6.4 oz (64.6 kg)  Height: 5\' 7"  (1.702 m)    Estimated body mass index is 22.3 kg/m as calculated from the following:   Height as of this encounter: 5\' 7"  (1.702 m).   Weight as of this encounter: 142 lb 6.4 oz (64.6 kg).  @WEIGHTCHANGE @  Filed Weights   06/01/22 1430  Weight: 142 lb 6.4 oz (64.6 kg)     Physical Exam    General: No distress. Looks well Neuro: Alert and Oriented x 3. GCS 15. Speech normal Psych: Pleasant Resp:  Barrel Chest - no.  Wheeze - no, Crackles - YES BASE, No overt respiratory distress CVS: Normal heart sounds. Murmurs - no Ext: Stigmata of Connective Tissue Disease - no HEENT: Normal upper airway. PEERL +. No post nasal drip        Assessment:  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 ANA positive  R76.8     4. History of systemic lupus erythematosus (SLE) (HCC)  M32.9          Plan:     Patient Instructions     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 ANA positive  R76.8     4. 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 Ct [facility] chest late 2022 showed worsening. Clinically you seem stable 2019 >  06/01/2022 based on symptoms and PFT and exercise desaturation test. Glad actually feeling better after admission march 2024 for dehdyration  and doing exercises at home  Plan  -- do cbc, bmet, lft, bnp 06/01/2022 - continue exercise - do spirometry and dlco in 4 months     Follow-up-  4 mnths to see DR [name]  - might need discussion on esbriet or immuran or cellcept if there is progresion  - might need  repeat walk test at followup  (Level 04: Estb 30-39 min   visit type: on-site physical face to visit visit spent in total care time and counseling or/and coordination of care by this undersigned MD - Dr Kalman Shan. This includes one or more of the following on this same day 06/01/2022: pre-charting, chart review, note writing, documentation discussion of test results, diagnostic or treatment recommendations, prognosis, risks and benefits of management options, instructions, education, compliance or risk-factor reduction. It excludes time spent by the CMA or office staff in the care of the patient . Actual time is 35 min)   SIGNATURE    Dr. Kalman Shan, M.D., F.C.C.P,  Pulmonary and Critical Care Medicine Staff Physician, [facility] Director - Interstitial Lung Disease  Program  Pulmonary Fibrosis [facility] at [facility] [place], [place], [address]  Pager: [phone], If no answer or between  15:00h - 7:00h: call [phone] Telephone: [phone]  3:02 PM 06/01/2022

## 2022-06-01 NOTE — Patient Instructions (Addendum)
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 ANA positive  R76.8     4. 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 Ct [facility] chest late 2022 showed worsening. Clinically you seem stable 2019 >  06/01/2022 based on symptoms and PFT and exercise desaturation test. Glad actually feeling better after admission march 2024 for dehdyration  and doing exercises at home  - Unclear why cough got worse around time of admission late March 2024 but glad slowly getteing better; likely reactive  Plan  - cXR 2 view 06/01/2022 -- do cbc, bmet, lft, bnp 06/01/2022 - continue exercise - do spirometry and dlco in 4 months - daughter informed to get rid of mold   Follow-up-  Call or return sooner if cough worsens/changes/does not resolve 4 mnths to see DR Marchelle Gearing  - might need discussion on esbriet or immuran or cellcept if there is progresion  - might need repeat walk test at followup

## 2022-07-08 ENCOUNTER — Ambulatory Visit: Payer: 59 | Admitting: Cardiology

## 2022-07-16 ENCOUNTER — Encounter: Payer: Self-pay | Admitting: Cardiology

## 2022-07-16 ENCOUNTER — Ambulatory Visit: Payer: Self-pay | Admitting: Cardiology

## 2022-07-16 NOTE — Progress Notes (Signed)
No Show       Primary Physician/Referring:  Merri Brunette, MD  Patient ID: [name], female    DOB: 09-Nov-1940, 82 y.o.   MRN: [id number]  No chief complaint on file.  HPI:    [name]  is an 82 y.o. African-American female with history of diabetes mellitus with stage III chronic kidney disease, coronary calcification by CT and a negative nuclear stress test in 2018, SLE on plaquenil, history of remote breast cancer status post right breast lumpectomy sometime in 2015, admitted to the hospital with near syncope on 02/17/19 with hyponatremic, also possibly UTI, again admitted on 04/15/2022 with similar presentation of syncope, dehydration and UTI with Enterococcus, she was hydrated and discharged home.  She now presents for follow-up.  Except for continued cough, she has no specific complaints today.  She is frustrated about the cough.  She has not had any further dizziness or syncope.  Past Medical History:  Diagnosis Date   Acute bronchitis 12/17/2021   Acute cystitis 02/20/2019   Breast cancer (HCC)    Diabetes mellitus without complication (HCC)    Hypertension    Hypothyroidism    Lupus (HCC)    Pneumonia 07/31/2021   Past Surgical History:  Procedure Laterality Date   BREAST LUMPECTOMY  2009   IR 3D INDEPENDENT WKST  05/29/2021   IR ANGIO INTRA EXTRACRAN SEL COM CAROTID INNOMINATE BILAT MOD SED  05/29/2021   IR ANGIO VERTEBRAL SEL VERTEBRAL BILAT MOD SED  05/29/2021   Family History  Problem Relation Age of Onset   Cancer Father        ? type   Asthma Brother     Social History   Tobacco Use   Smoking status: Never   Smokeless tobacco: Former    Types: Snuff    Quit date: 02/03/2015  Substance Use Topics   Alcohol use: No   Marital Status: Single  ROS  Review of Systems  Cardiovascular:  Positive for dyspnea on exertion and palpitations. Negative for chest pain and leg swelling.  Respiratory:  Positive for wheezing.   Musculoskeletal:  Positive for  arthritis and back pain.   Objective  There were no vitals taken for this visit.     06/01/2022    2:30 PM 05/27/2022   11:18 AM 05/27/2022   11:11 AM  Vitals with BMI  Height 5\' 7"   5\' 7"   Weight 142 lbs 6 oz  143 lbs  BMI 22.3  22.39  Systolic 114 178 191  Diastolic 60 82 79  Pulse 78 64 66     Physical Exam Vitals reviewed.  Neck:     Vascular: No carotid bruit or JVD.  Cardiovascular:     Rate and Rhythm: Normal rate and regular rhythm.     Pulses: Intact distal pulses.     Heart sounds: Normal heart sounds. No murmur heard.    No gallop.  Pulmonary:     Effort: Pulmonary effort is normal.     Breath sounds: Rales (coarse bilateral basal leathery crackles) present.  Abdominal:     General: Bowel sounds are normal.     Palpations: Abdomen is soft.  Musculoskeletal:     Cervical back: Neck supple.     Right lower leg: No edema.     Left lower leg: No edema.    Laboratory examination:   Recent Labs    04/25/22 1704 04/25/22 2211 04/26/22 0045 06/01/22 1518  NA 126* 129* 131* 134*  K  3.4* 3.4* 3.7 3.8  CL 97* 101 101 100  CO2 19* 19* 22 27  GLUCOSE 80 73 86 64*  BUN 13 11 11 7   CREATININE 1.24* 1.02* 1.10* 1.00  CALCIUM 8.6* 8.2* 8.6* 9.7  GFRNONAA 44* 56* 51*  --        Latest Ref Rng & Units 06/01/2022    3:18 PM 04/26/2022   12:45 AM 04/25/2022   10:11 PM  CMP  Glucose 70 - 99 mg/dL 64  86  73   BUN 6 - 23 mg/dL 7  11  11    Creatinine 0.40 - 1.20 mg/dL 7.82  9.56  2.13   Sodium 135 - 145 mEq/L 134  131  129   Potassium 3.5 - 5.1 mEq/L 3.8  3.7  3.4   Chloride 96 - 112 mEq/L 100  101  101   CO2 19 - 32 mEq/L 27  22  19    Calcium 8.4 - 10.5 mg/dL 9.7  8.6  8.2   Total Protein 6.0 - 8.3 g/dL 8.9  7.4    Total Bilirubin 0.2 - 1.2 mg/dL 0.4  0.6    Alkaline Phos 39 - 117 U/L 57  44    AST 0 - 37 U/L 17  18    ALT 0 - 35 U/L 8  10        Latest Ref Rng & Units 06/01/2022    3:18 PM 04/26/2022   12:45 AM 04/25/2022   10:11 PM  CBC  WBC 4.0 - 10.5 K/uL  6.2  5.4  5.3   Hemoglobin 12.0 - 15.0 g/dL 08.6  57.8  46.9   Hematocrit 36.0 - 46.0 % 36.9  33.1  31.3   Platelets 150.0 - 400.0 K/uL 274.0  203  207    TSH Recent Labs    04/25/22 1702 04/25/22 2211  TSH 3.262 3.005     External labs:  Labs 06/25/2020:  A1c 7.5%.  Hb 11.3/HCT 33.7, platelets 234.  Normal indicis.  Serum glucose 122, BUN 13, creatinine 1.20, EGFR 46 mL, potassium 4.3, CMP otherwise normal.  Total cholesterol 104, triglycerides 113, HDL 37, LDL 67.  Non-HDL cholesterol 113.  Medications and allergies   Allergies  Allergen Reactions   Ciprofloxacin Itching, Swelling and Other (See Comments)    Possibly causing tremors?   Lisinopril Cough   Crestor [Rosuvastatin] Other (See Comments)    Myalgia and back pain   Ofev [Nintedanib] Nausea Only     Current Outpatient Medications:    albuterol (VENTOLIN HFA) 108 (90 Base) MCG/ACT inhaler, Inhale 2 puffs into the lungs every 4 (four) hours as needed for wheezing or shortness of breath., Disp: 18 g, Rfl: 6   amLODipine (NORVASC) 5 MG tablet, Take 1 tablet (5 mg total) by mouth every evening., Disp: 30 tablet, Rfl: 2   bisoprolol (ZEBETA) 5 MG tablet, Take 1 tablet (5 mg total) by mouth daily., Disp: 30 tablet, Rfl: 1   diclofenac Sodium (VOLTAREN) 1 % GEL, Apply 1 application. topically 4 (four) times daily as needed (pain)., Disp: , Rfl:    gabapentin (NEURONTIN) 300 MG capsule, Take 300 mg by mouth every 8 (eight) hours., Disp: , Rfl:    glimepiride (AMARYL) 1 MG tablet, Take 1 mg by mouth daily with breakfast., Disp: , Rfl:    hydroxychloroquine (PLAQUENIL) 200 MG tablet, Take 1 tablet (200 mg total) by mouth daily., Disp: 30 tablet, Rfl: 0   ibuprofen (ADVIL) 200 MG tablet, Take 200  mg by mouth every 6 (six) hours as needed for moderate pain or headache., Disp: , Rfl:    ipratropium-albuterol (DUONEB) 0.5-2.5 (3) MG/3ML SOLN, Take 3 mLs by nebulization every 6 (six) hours as needed., Disp: 360 mL, Rfl: 6    levothyroxine (SYNTHROID) 100 MCG tablet, Take 100 mcg by mouth daily., Disp: , Rfl:    omeprazole (PRILOSEC) 20 MG capsule, Take 20 mg by mouth daily before breakfast., Disp: , Rfl:    spironolactone (ALDACTONE) 25 MG tablet, Take 0.5 tablets (12.5 mg total) by mouth every morning., Disp: 30 tablet, Rfl: 2  Radiology:   CT   chest 04/26/2022: 1. Negative for acute PE or thoracic aortic dissection. 2. Pulmonary fibrosis with new airspace and ground-glass opacities throughout much of the left lower lobe, and to a lesser degree in the basilar segments of the right lower lobe, suggesting pneumonia. 3. Mild mediastinal adenopathy, possibly reactive but nonspecific. 4. Cholelithiasis. 5. Coronary and  Aortic Atherosclerosis  Cardiac Studies:   Lexiscan myoview stress test 07/03/2016: 1. The resting electrocardiogram demonstrated normal sinus rhythm, normal resting conduction, PVC and normal rest repolarization. Stress EKG is non-diagnostic for ischemia as it a pharmacologic stress using Lexiscan. Stress symptoms included dyspnea. 2. Myocardial perfusion imaging is normal. Overall left ventricular systolic function was normal without regional wall motion abnormalities. The left ventricular ejection fraction was 75%.   Carotid artery duplex  02/18/2019: Right Carotid: Velocities in the right ICA are consistent with a 1-39% stenosis. Left Carotid: Velocities in the left ICA are consistent with a 1-39% stenosis. Vertebrals:  Bilateral vertebral arteries demonstrate antegrade flow. Subclavians: Normal flow hemodynamics were seen in bilateral subclavian arteries.  ECHOCARDIOGRAM COMPLETE 05/08/2022  1. Left ventricular ejection fraction, by estimation, is 60 to 65%. The left ventricle has normal function. The left ventricle has no regional wall motion abnormalities. There is mild concentric left ventricular hypertrophy. Left ventricular diastolic  parameters are consistent with Grade I diastolic  dysfunction (impaired relaxation). Elevated left ventricular end-diastolic pressure.  2. Right ventricular systolic function is normal. The right ventricular size is normal.  3. There is severe calcification involving the anterior mitral valve leaflet that extends into the chordae tendinae. The mitral valve is degenerative. Trivial mitral valve regurgitation. No evidence of mitral stenosis.  4. The aortic valve is normal in structure. Aortic valve regurgitation is not visualized. No aortic stenosis is present.  5. 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  EKG  EKG 05/27/2022: Sinus bradycardia at rate of 59 bpm, leftward axis.  Poor R progression, probably normal variant.  Nonspecific T abnormality.  Compared to 04/17/2021, no significant change.  Previous heart rate was 71 bpm.  Assessment     ICD-10-CM   1. Primary hypertension  I10     2. Palpitations  R00.2     3. Dyspnea on exertion  R06.09       There are no discontinued medications.   No orders of the defined types were placed in this encounter.  Recommendations:   ERICCA LABRA is a 82 y.o. African-American female with history of diabetes mellitus with stage III chronic kidney disease, coronary calcification by CT and a negative nuclear stress test in 2018, SLE on plaquenil, history of remote breast cancer status post right breast lumpectomy sometime in 2015, admitted to the hospital with near syncope on 02/17/19 with hyponatremic, also possibly UTI, again admitted on 04/15/2022 with similar presentation of syncope, dehydration and UTI with Enterococcus, she was hydrated and discharged home.  Seen  by me 6 weeks ago and apparently initiated her back on antihypertensive medications she now presents for follow-up.  1. Primary hypertension  2. Stage 3a chronic kidney disease (HCC) Patient is tolerating spironolactone and renal function has remained stable.   Yates Decamp, MD, [facility] 07/16/2022, 7:07 AM Office: [phone] Fax:  [phone] Pager: [phone]      CC Dr. Harriett Sine, Dr. Merri Brunette

## 2022-08-12 ENCOUNTER — Other Ambulatory Visit: Payer: Self-pay | Admitting: Cardiology

## 2022-08-12 DIAGNOSIS — I10 Essential (primary) hypertension: Secondary | ICD-10-CM

## 2022-09-08 ENCOUNTER — Ambulatory Visit: Payer: 59 | Admitting: Cardiology

## 2022-09-08 ENCOUNTER — Encounter: Payer: Self-pay | Admitting: Cardiology

## 2022-09-08 VITALS — BP 130/70 | HR 69 | Resp 16 | Ht 67.0 in | Wt 148.0 lb

## 2022-09-08 DIAGNOSIS — N1831 Chronic kidney disease, stage 3a: Secondary | ICD-10-CM

## 2022-09-08 DIAGNOSIS — I10 Essential (primary) hypertension: Secondary | ICD-10-CM

## 2022-09-08 MED ORDER — SPIRONOLACTONE 25 MG PO TABS
12.5000 mg | ORAL_TABLET | ORAL | 0 refills | Status: AC
Start: 2022-09-08 — End: 2022-12-07

## 2022-09-08 NOTE — Progress Notes (Signed)
Primary Physician/Referring:  Merri Brunette, MD  Patient ID: [name], female    DOB: 08/16/1940, 82 y.o.   MRN: [id number]  Chief Complaint  Patient presents with  . Hypertension  . Results  . Follow-up   HPI:    [name]  is a 82 y.o. African-American female with history of diabetes mellitus with stage III chronic kidney disease, coronary calcification by CT and a negative nuclear stress test in 2018, SLE on plaquenil, history of remote breast cancer status post right breast lumpectomy sometime in 2015, admitted to the hospital with near syncope on 80/22/21 with hyponatremic, also possibly UTI, again admitted on 80/20/2024 with similar presentation of syncope, dehydration and UTI with Enterococcus, she was hydrated and discharged home.    She was seen by me about 2 months ago, her blood pressure was markedly elevated hence he restarted her medications that were discontinued at the hospital.  However she had developed continued and worsening cough as well and wanted to be seen.  I discontinued lisinopril started on spironolactone she now presents for follow-up.  States that her blood pressure has been under excellent control at home with blood pressure being no greater than 125/70 mmHg.  She remains asymptomatic except for chronic dyspnea..  Past Medical History:  Diagnosis Date  . Acute bronchitis 12/17/2021  . Acute cystitis 02/20/2019  . Breast cancer (HCC)   . Diabetes mellitus without complication (HCC)   . Hypertension   . Hypothyroidism   . Lupus (HCC)   . Pneumonia 07/31/2021   Past Surgical History:  Procedure Laterality Date  . BREAST LUMPECTOMY  2009  . IR 3D INDEPENDENT WKST  05/29/2021  . IR ANGIO INTRA EXTRACRAN SEL COM CAROTID INNOMINATE BILAT MOD SED  05/29/2021  . IR ANGIO VERTEBRAL SEL VERTEBRAL BILAT MOD SED  05/29/2021   Family History  Problem Relation Age of Onset  . Cancer Father        ? type  . Asthma Brother     Social History   Tobacco Use   . Smoking status: Never  . Smokeless tobacco: Former    Types: Snuff    Quit date: 02/03/2015  Substance Use Topics  . Alcohol use: No   Marital Status: Single  ROS  Review of Systems  Cardiovascular:  Positive for dyspnea on exertion. Negative for chest pain, leg swelling and palpitations.  Respiratory:  Positive for wheezing.   Musculoskeletal:  Positive for arthritis and back pain.   Objective  Blood pressure 130/70, pulse 69, resp. rate 16, height 5\' 7"  (1.702 m), weight 148 lb (67.1 kg), SpO2 97%.     09/08/2022   12:51 PM 09/08/2022   12:11 PM 06/01/2022    2:30 PM  Vitals with BMI  Height  5\' 7"  5\' 7"   Weight  148 lbs 142 lbs 6 oz  BMI  23.17 22.3  Systolic 130 139 409  Diastolic 70 69 60  Pulse  69 78     Physical Exam Vitals reviewed.  Neck:     Vascular: No carotid bruit or JVD.  Cardiovascular:     Rate and Rhythm: Normal rate and regular rhythm.     Pulses: Intact distal pulses.     Heart sounds: Normal heart sounds. No murmur heard.    No gallop.  Pulmonary:     Effort: Pulmonary effort is normal.     Breath sounds: Rales (coarse bilateral basal leathery crackles) present.  Abdominal:     General:  Bowel sounds are normal.     Palpations: Abdomen is soft.  Musculoskeletal:     Cervical back: Neck supple.     Right lower leg: No edema.     Left lower leg: No edema.   Laboratory examination:   Recent Labs    04/25/22 1704 04/25/22 2211 04/26/22 0045 06/01/22 1518  NA 126* 129* 131* 134*  K 3.4* 3.4* 3.7 3.8  CL 97* 101 101 100  CO2 19* 19* 22 27  GLUCOSE 80 73 86 64*  BUN 13 11 11 7   CREATININE 1.24* 1.02* 1.10* 1.00  CALCIUM 8.6* 8.2* 8.6* 9.7  GFRNONAA 44* 56* 51*  --       Latest Ref Rng & Units 06/01/2022    3:18 PM 04/26/2022   12:45 AM 04/25/2022   10:11 PM  CMP  Glucose 70 - 99 mg/dL 64  86  73   BUN 6 - 23 mg/dL 7  11  11    Creatinine 0.40 - 1.20 mg/dL 1.91  4.78  2.95   Sodium 135 - 145 mEq/L 134  131  129   Potassium 3.5 - 5.1  mEq/L 3.8  3.7  3.4   Chloride 96 - 112 mEq/L 100  101  101   CO2 19 - 32 mEq/L 27  22  19    Calcium 8.4 - 10.5 mg/dL 9.7  8.6  8.2   Total Protein 6.0 - 8.3 g/dL 8.9  7.4    Total Bilirubin 0.2 - 1.2 mg/dL 0.4  0.6    Alkaline Phos 39 - 117 U/L 57  44    AST 0 - 37 U/L 17  18    ALT 0 - 35 U/L 8  10        Latest Ref Rng & Units 06/01/2022    3:18 PM 04/26/2022   12:45 AM 04/25/2022   10:11 PM  CBC  WBC 4.0 - 10.5 K/uL 6.2  5.4  5.3   Hemoglobin 12.0 - 15.0 g/dL 62.1  30.8  65.7   Hematocrit 36.0 - 46.0 % 36.9  33.1  31.3   Platelets 150.0 - 400.0 K/uL 274.0  203  207    TSH Recent Labs    04/25/22 1702 04/25/22 2211  TSH 3.262 3.005    External labs:  Labs 06/25/2020:  A1c 7.5%.  Hb 11.3/HCT 33.7, platelets 234.  Normal indicis.  Serum glucose 122, BUN 13, creatinine 1.20, EGFR 46 mL, potassium 4.3, CMP otherwise normal.  Total cholesterol 104, triglycerides 113, HDL 37, LDL 67.  Non-HDL cholesterol 113.  Radiology:   CT   chest 04/26/2022: 1. Negative for acute PE or thoracic aortic dissection. 2. Pulmonary fibrosis with new airspace and ground-glass opacities throughout much of the left lower lobe, and to a lesser degree in the basilar segments of the right lower lobe, suggesting pneumonia. 3. Mild mediastinal adenopathy, possibly reactive but nonspecific. 4. Cholelithiasis. 5. Coronary and  Aortic Atherosclerosis  Cardiac Studies:   Lexiscan myoview stress test 07/03/2016: 1. The resting electrocardiogram demonstrated normal sinus rhythm, normal resting conduction, PVC and normal rest repolarization. Stress EKG is non-diagnostic for ischemia as it a pharmacologic stress using Lexiscan. Stress symptoms included dyspnea. 2. Myocardial perfusion imaging is normal. Overall left ventricular systolic function was normal without regional wall motion abnormalities. The left ventricular ejection fraction was 75%.   Carotid artery duplex  02/18/2019: Right Carotid:  Velocities in the right ICA are consistent with a 1-39% stenosis. Left Carotid: Velocities in the  left ICA are consistent with a 1-39% stenosis. Vertebrals:  Bilateral vertebral arteries demonstrate antegrade flow. Subclavians: Normal flow hemodynamics were seen in bilateral subclavian arteries.  ECHOCARDIOGRAM COMPLETE 05/08/2022  1. Left ventricular ejection fraction, by estimation, is 60 to 65%. The left ventricle has normal function. The left ventricle has no regional wall motion abnormalities. There is mild concentric left ventricular hypertrophy. Left ventricular diastolic  parameters are consistent with Grade I diastolic dysfunction (impaired relaxation). Elevated left ventricular end-diastolic pressure.  2. Right ventricular systolic function is normal. The right ventricular size is normal.  3. There is severe calcification involving the anterior mitral valve leaflet that extends into the chordae tendinae. The mitral valve is degenerative. Trivial mitral valve regurgitation. No evidence of mitral stenosis.  4. The aortic valve is normal in structure. Aortic valve regurgitation is not visualized. No aortic stenosis is present.  5. 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  EKG  EKG 05/27/2022: Sinus bradycardia at rate of 59 bpm, leftward axis.  Poor R progression, probably normal variant.  Nonspecific T abnormality.  Compared to 04/17/2021, no significant change.  Previous heart rate was 71 bpm.   Medications and allergies   Allergies  Allergen Reactions  . Ciprofloxacin Itching, Swelling and Other (See Comments)    Possibly causing tremors?  . Lisinopril Cough  . Crestor [Rosuvastatin] Other (See Comments)    Myalgia and back pain  . Ofev [Nintedanib] Nausea Only    Current Outpatient Medications:  .  albuterol (VENTOLIN HFA) 108 (90 Base) MCG/ACT inhaler, Inhale 2 puffs into the lungs every 4 (four) hours as needed for wheezing or shortness of breath., Disp: 18 g, Rfl: 6 .   amLODipine (NORVASC) 5 MG tablet, TAKE ONE TABLET BY MOUTH EVERY EVENING, Disp: 30 tablet, Rfl: 2 .  bisoprolol (ZEBETA) 5 MG tablet, Take 1 tablet (5 mg total) by mouth daily., Disp: 30 tablet, Rfl: 1 .  diclofenac Sodium (VOLTAREN) 1 % GEL, Apply 1 application. topically 4 (four) times daily as needed (pain)., Disp: , Rfl:  .  gabapentin (NEURONTIN) 300 MG capsule, Take 300 mg by mouth every 8 (eight) hours., Disp: , Rfl:  .  glimepiride (AMARYL) 1 MG tablet, Take 1 mg by mouth daily with breakfast., Disp: , Rfl:  .  hydroxychloroquine (PLAQUENIL) 200 MG tablet, Take 1 tablet (200 mg total) by mouth daily., Disp: 30 tablet, Rfl: 0 .  ibuprofen (ADVIL) 200 MG tablet, Take 200 mg by mouth every 6 (six) hours as needed for moderate pain or headache., Disp: , Rfl:  .  ipratropium-albuterol (DUONEB) 0.5-2.5 (3) MG/3ML SOLN, Take 3 mLs by nebulization every 6 (six) hours as needed., Disp: 360 mL, Rfl: 6 .  levothyroxine (SYNTHROID) 100 MCG tablet, Take 100 mcg by mouth daily., Disp: , Rfl:  .  omeprazole (PRILOSEC) 20 MG capsule, Take 20 mg by mouth daily before breakfast., Disp: , Rfl:  .  spironolactone (ALDACTONE) 25 MG tablet, Take 0.5 tablets (12.5 mg total) by mouth every morning., Disp: 45 tablet, Rfl: 0   Assessment     ICD-10-CM   1. Primary hypertension  I10 spironolactone (ALDACTONE) 25 MG tablet    2. Stage 3a chronic kidney disease (HCC)  N18.31       Medications Discontinued During This Encounter  Medication Reason  . spironolactone (ALDACTONE) 25 MG tablet Reorder     Meds ordered this encounter  Medications  . spironolactone (ALDACTONE) 25 MG tablet    Sig: Take 0.5 tablets (12.5 mg total) by  mouth every morning.    Dispense:  45 tablet    Refill:  0    Refills with Dr. Merri Brunette   Recommendations:   [name] is a 83 y.o. African-American female with history of diabetes mellitus with stage III chronic kidney disease, coronary calcification by CT and a  negative nuclear stress test in 2018, SLE on plaquenil, history of remote breast cancer status post right breast lumpectomy sometime in 2015, admitted to the hospital with near syncope on 80/22/21 with hyponatremic, also possibly UTI, again admitted on 80/20/2024 with similar presentation of syncope, dehydration and UTI with Enterococcus, she was hydrated and discharged home.    Since hospital discharge her blood pressure has been markedly elevated, she was seen by me about 2 months ago and had discontinued lisinopril due to cough and added spironolactone she now presents for follow-up.  1. Primary hypertension Since addition of low-dose of spironolactone, 12.5 mg daily, blood pressure is under excellent control.  I also reviewed her BMP, sodium levels are minimally decreased but overall improved from before and potassium levels are normal and she does have stage IIIa chronic kidney disease that has remained stable as well.  In view of this continue spironolactone at 12.5 mg daily, further refills via her PCP.  Since discontinuing lisinopril, her cough is completely resolved.  She does have ILD but glad that he has responded well to stopping the lisinopril.  - spironolactone (ALDACTONE) 25 MG tablet; Take 0.5 tablets (12.5 mg total) by mouth every morning.  Dispense: 45 tablet; Refill: 0  2. Stage 3a chronic kidney disease (HCC) As dictated above, kidney function has remained stable.  I will see her back on a as needed basis.   Yates Decamp, MD, [facility] 09/08/2022, 12:54 PM Office: [phone] Fax: [phone] Pager: [phone]      CC Dr. Kalman Shan, Dr. Merri Brunette

## 2022-10-04 ENCOUNTER — Ambulatory Visit
Admission: EM | Admit: 2022-10-04 | Discharge: 2022-10-04 | Disposition: A | Discharge status: Home / Self Care | Payer: 59 | Attending: Internal Medicine | Admitting: Internal Medicine

## 2022-10-04 DIAGNOSIS — J069 Acute upper respiratory infection, unspecified: Secondary | ICD-10-CM | POA: Insufficient documentation

## 2022-10-04 DIAGNOSIS — Z1152 Encounter for screening for COVID-19: Secondary | ICD-10-CM | POA: Insufficient documentation

## 2022-10-04 DIAGNOSIS — R059 Cough, unspecified: Secondary | ICD-10-CM | POA: Diagnosis not present

## 2022-10-04 DIAGNOSIS — B9789 Other viral agents as the cause of diseases classified elsewhere: Secondary | ICD-10-CM | POA: Diagnosis not present

## 2022-10-04 MED ORDER — BENZONATATE 100 MG PO CAPS
100.0000 mg | ORAL_CAPSULE | Freq: Three times a day (TID) | ORAL | 0 refills | Status: DC | PRN
Start: 2022-10-04 — End: 2022-10-16

## 2022-10-04 MED ORDER — FLUTICASONE PROPIONATE 50 MCG/ACT NA SUSP
1.0000 | Freq: Every day | NASAL | 0 refills | Status: DC
Start: 2022-10-04 — End: 2022-10-16

## 2022-10-04 NOTE — Discharge Instructions (Addendum)
It appears that you have a viral illness that should run its course.  I have prescribed you 2 medications to alleviate symptoms.  COVID test is pending.  Follow-up if any symptoms persist or worsen.

## 2022-10-04 NOTE — ED Provider Notes (Signed)
[facility]    CSN: [id number] Arrival date & time: 10/04/22  1336      History   Chief Complaint No chief complaint on file.   HPI [name] is a 82 y.o. female.   Patient presents with cough, runny nose, sneezing that started about 2 days ago.  Denies any fever but reports her grandchildren has had similar symptoms.  Patient has taken Tussin at home with minimal improvement.  Denies history of asthma or COPD.  Denies chest pain or shortness of breath.     Past Medical History:  Diagnosis Date   Acute bronchitis 12/17/2021   Acute cystitis 02/20/2019   Breast cancer (HCC)    Diabetes mellitus without complication (HCC)    Hypertension    Hypothyroidism    Lupus (HCC)    Pneumonia 07/31/2021    Patient Active Problem List   Diagnosis Date Noted   Syncope 04/25/2022   Near syncope 02/18/2019   Hyponatremia 02/18/2019   ILD (interstitial lung disease) (HCC) 07/30/2016   Dyspnea 07/16/2016   Bibasilar crackles 07/16/2016   History of systemic lupus erythematosus (SLE) (HCC) 07/16/2016   Cough 07/16/2016   Encounter for monitoring ACE-inhibitor therapy 07/16/2016   History of asthma 07/16/2016   Hypothyroidism 01/03/2015   Primary hypertension 01/03/2015   Drug-induced hypersensitivity reaction 01/03/2015   Lupus (HCC) 01/03/2015   Pyrexia 01/02/2015    Past Surgical History:  Procedure Laterality Date   BREAST LUMPECTOMY  2009   IR 3D INDEPENDENT WKST  05/29/2021   IR ANGIO INTRA EXTRACRAN SEL COM CAROTID INNOMINATE BILAT MOD SED  05/29/2021   IR ANGIO VERTEBRAL SEL VERTEBRAL BILAT MOD SED  05/29/2021    OB History   No obstetric history on file.      Home Medications    Prior to Admission medications   Medication Sig Start Date End Date Taking? Authorizing Provider  benzonatate (TESSALON) 100 MG capsule Take 1 capsule (100 mg total) by mouth every 8 (eight) hours as needed for cough. 10/04/22  Yes Levonte Molina, Rolly Salter E, FNP  fluticasone  (FLONASE) 50 MCG/ACT nasal spray Place 1 spray into both nostrils daily. 10/04/22  Yes Doranne Schmutz, Acie Fredrickson, FNP  albuterol (VENTOLIN HFA) 108 (90 Base) MCG/ACT inhaler Inhale 2 puffs into the lungs every 4 (four) hours as needed for wheezing or shortness of breath. 07/03/21   Omar Person, MD  amLODipine (NORVASC) 5 MG tablet TAKE ONE TABLET BY MOUTH EVERY EVENING 08/12/22   Yates Decamp, MD  bisoprolol (ZEBETA) 5 MG tablet Take 1 tablet (5 mg total) by mouth daily. 04/28/22   Ghimire, Werner Lean, MD  diclofenac Sodium (VOLTAREN) 1 % GEL Apply 1 application. topically 4 (four) times daily as needed (pain).    [provider]  gabapentin (NEURONTIN) 300 MG capsule Take 300 mg by mouth every 8 (eight) hours.    [provider]  glimepiride (AMARYL) 1 MG tablet Take 1 mg by mouth daily with breakfast.    [provider]  hydroxychloroquine (PLAQUENIL) 200 MG tablet Take 1 tablet (200 mg total) by mouth daily. 01/06/15   Jeralyn Bennett, MD  ibuprofen (ADVIL) 200 MG tablet Take 200 mg by mouth every 6 (six) hours as needed for moderate pain or headache.    [provider]  ipratropium-albuterol (DUONEB) 0.5-2.5 (3) MG/3ML SOLN Take 3 mLs by nebulization every 6 (six) hours as needed. 07/11/21   Omar Person, MD  levothyroxine (SYNTHROID) 100 MCG tablet Take 100 mcg  by mouth daily. 02/05/21   [provider]  omeprazole (PRILOSEC) 20 MG capsule Take 20 mg by mouth daily before breakfast.    [provider]  spironolactone (ALDACTONE) 25 MG tablet Take 0.5 tablets (12.5 mg total) by mouth every morning. 09/08/22 12/07/22  Yates Decamp, MD  Tiotropium Bromide Monohydrate (SPIRIVA RESPIMAT) 1.25 MCG/ACT AERS Inhale 2 puffs into the lungs daily. Patient not taking: Reported on 09/15/2019 08/17/19 11/29/19  Kalman Shan, MD    Family History Family History  Problem Relation Age of Onset   Cancer Father        ? type   Asthma Brother     Social  History Social History   Tobacco Use   Smoking status: Never   Smokeless tobacco: Former    Types: Snuff    Quit date: 02/03/2015  Vaping Use   Vaping status: Never Used  Substance Use Topics   Alcohol use: No   Drug use: No     Allergies   Ciprofloxacin, Lisinopril, Crestor [rosuvastatin], and Ofev [nintedanib]   Review of Systems Review of Systems Per HPI  Physical Exam Triage Vital Signs ED Triage Vitals  Encounter Vitals Group     BP 10/04/22 1447 (!) 152/77     Systolic BP Percentile --      Diastolic BP Percentile --      Pulse Rate 10/04/22 1447 69     Resp 10/04/22 1447 18     Temp 10/04/22 1447 98.7 F (37.1 C)     Temp Source 10/04/22 1447 Oral     SpO2 10/04/22 1447 97 %     Weight 10/04/22 1451 140 lb (63.5 kg)     Height 10/04/22 1451 5\' 7"  (1.702 m)     Head Circumference --      Peak Flow --      Pain Score 10/04/22 1450 0     Pain Loc --      Pain Education --      Exclude from Growth Chart --    No data found.  Updated Vital Signs BP (!) 152/77 (BP Location: Left Arm)   Pulse 69   Temp 98.7 F (37.1 C) (Oral)   Resp 18   Ht 5\' 7"  (1.702 m)   Wt 140 lb (63.5 kg)   SpO2 97%   BMI 21.93 kg/m   Visual Acuity Right Eye Distance:   Left Eye Distance:   Bilateral Distance:    Right Eye Near:   Left Eye Near:    Bilateral Near:     Physical Exam Constitutional:      General: She is not in acute distress.    Appearance: Normal appearance. She is not toxic-appearing or diaphoretic.  HENT:     Head: Normocephalic and atraumatic.     Right Ear: Tympanic membrane and ear canal normal.     Left Ear: Tympanic membrane and ear canal normal.     Nose: Congestion present.     Mouth/Throat:     Mouth: Mucous membranes are moist.     Pharynx: No posterior oropharyngeal erythema.  Eyes:     Extraocular Movements: Extraocular movements intact.     Conjunctiva/sclera: Conjunctivae normal.     Pupils: Pupils are equal, round, and reactive to  light.  Cardiovascular:     Rate and Rhythm: Normal rate and regular rhythm.     Pulses: Normal pulses.     Heart sounds: Normal heart sounds.  Pulmonary:     Effort:  Pulmonary effort is normal. No respiratory distress.     Breath sounds: Normal breath sounds. No stridor. No wheezing, rhonchi or rales.  Abdominal:     General: Abdomen is flat. Bowel sounds are normal.     Palpations: Abdomen is soft.  Musculoskeletal:        General: Normal range of motion.     Cervical back: Normal range of motion.  Skin:    General: Skin is warm and dry.  Neurological:     General: No focal deficit present.     Mental Status: She is alert and oriented to person, place, and time. Mental status is at baseline.  Psychiatric:        Mood and Affect: Mood normal.        Behavior: Behavior normal.      UC Treatments / Results  Labs (all labs ordered are listed, but only abnormal results are displayed) Labs Reviewed  SARS CORONAVIRUS 2 (TAT 6-24 HRS)    EKG   Radiology No results found.  Procedures Procedures (including critical care time)  Medications Ordered in UC Medications - No data to display  Initial Impression / Assessment and Plan / UC Course  I have reviewed the triage vital signs and the nursing notes.  Pertinent labs & imaging results that were available during my care of the patient were reviewed by me and considered in my medical decision making (see chart for details).     Patient presents with symptoms likely from a viral upper respiratory infection.  Do not suspect underlying cardiopulmonary process. Symptoms seem unlikely related to ACS, CHF or COPD exacerbations, pneumonia, pneumothorax. Patient is nontoxic appearing and not in need of emergent medical intervention. Covid test pending.   Recommended symptom control with medications and supportive care.  Patient was sent Flonase and benzonatate to take for symptoms.  Return if symptoms fail to improve in 1-2 weeks  or you develop shortness of breath, chest pain, severe headache. Patient states understanding and is agreeable.  Discharged with PCP followup.  Final Clinical Impressions(s) / UC Diagnoses   Final diagnoses:  Viral upper respiratory tract infection with cough     Discharge Instructions      It appears that you have a viral illness that should run its course.  I have prescribed you 2 medications to alleviate symptoms.  COVID test is pending.  Follow-up if any symptoms persist or worsen.    ED Prescriptions     Medication Sig Dispense Auth. Provider   fluticasone (FLONASE) 50 MCG/ACT nasal spray Place 1 spray into both nostrils daily. 16 g Tya Haughey, Rolly Salter E, [place]   benzonatate (TESSALON) 100 MG capsule Take 1 capsule (100 mg total) by mouth every 8 (eight) hours as needed for cough. 21 capsule Bell, Acie Fredrickson, [place]      PDMP not reviewed this encounter.   Gustavus Bryant, [place] 10/04/22 [phone]

## 2022-10-04 NOTE — ED Triage Notes (Signed)
Patient presents with cough, runny nose and sneezing x day 2. Treated with safe tussin without relief.

## 2022-10-05 LAB — SARS CORONAVIRUS 2 (TAT 6-24 HRS): SARS Coronavirus 2: NEGATIVE

## 2022-10-11 ENCOUNTER — Other Ambulatory Visit: Payer: Self-pay

## 2022-10-11 ENCOUNTER — Encounter (HOSPITAL_COMMUNITY): Payer: Self-pay

## 2022-10-11 ENCOUNTER — Inpatient Hospital Stay (HOSPITAL_COMMUNITY)
Admission: EM | Admit: 2022-10-11 | Discharge: 2022-10-16 | DRG: 871 | Disposition: A | Discharge status: Home Health | Payer: 59 | Attending: Internal Medicine | Admitting: Internal Medicine

## 2022-10-11 ENCOUNTER — Emergency Department (HOSPITAL_COMMUNITY): Payer: 59

## 2022-10-11 DIAGNOSIS — Z888 Allergy status to other drugs, medicaments and biological substances status: Secondary | ICD-10-CM

## 2022-10-11 DIAGNOSIS — I4892 Unspecified atrial flutter: Secondary | ICD-10-CM | POA: Diagnosis present

## 2022-10-11 DIAGNOSIS — R509 Fever, unspecified: Secondary | ICD-10-CM | POA: Diagnosis present

## 2022-10-11 DIAGNOSIS — I4891 Unspecified atrial fibrillation: Secondary | ICD-10-CM | POA: Diagnosis present

## 2022-10-11 DIAGNOSIS — Z79899 Other long term (current) drug therapy: Secondary | ICD-10-CM

## 2022-10-11 DIAGNOSIS — J13 Pneumonia due to Streptococcus pneumoniae: Secondary | ICD-10-CM | POA: Diagnosis present

## 2022-10-11 DIAGNOSIS — A419 Sepsis, unspecified organism: Secondary | ICD-10-CM | POA: Diagnosis present

## 2022-10-11 DIAGNOSIS — R6520 Severe sepsis without septic shock: Secondary | ICD-10-CM | POA: Diagnosis present

## 2022-10-11 DIAGNOSIS — N3000 Acute cystitis without hematuria: Secondary | ICD-10-CM

## 2022-10-11 DIAGNOSIS — I700 Atherosclerosis of aorta: Secondary | ICD-10-CM | POA: Insufficient documentation

## 2022-10-11 DIAGNOSIS — R0902 Hypoxemia: Secondary | ICD-10-CM | POA: Diagnosis not present

## 2022-10-11 DIAGNOSIS — N1831 Chronic kidney disease, stage 3a: Secondary | ICD-10-CM | POA: Diagnosis present

## 2022-10-11 DIAGNOSIS — Z87440 Personal history of urinary (tract) infections: Secondary | ICD-10-CM

## 2022-10-11 DIAGNOSIS — I10 Essential (primary) hypertension: Secondary | ICD-10-CM | POA: Diagnosis not present

## 2022-10-11 DIAGNOSIS — J189 Pneumonia, unspecified organism: Principal | ICD-10-CM

## 2022-10-11 DIAGNOSIS — R197 Diarrhea, unspecified: Secondary | ICD-10-CM | POA: Diagnosis present

## 2022-10-11 DIAGNOSIS — E119 Type 2 diabetes mellitus without complications: Secondary | ICD-10-CM | POA: Diagnosis not present

## 2022-10-11 DIAGNOSIS — M329 Systemic lupus erythematosus, unspecified: Secondary | ICD-10-CM | POA: Diagnosis present

## 2022-10-11 DIAGNOSIS — E871 Hypo-osmolality and hyponatremia: Secondary | ICD-10-CM | POA: Diagnosis present

## 2022-10-11 DIAGNOSIS — A403 Sepsis due to Streptococcus pneumoniae: Secondary | ICD-10-CM | POA: Diagnosis present

## 2022-10-11 DIAGNOSIS — R0602 Shortness of breath: Secondary | ICD-10-CM | POA: Diagnosis present

## 2022-10-11 DIAGNOSIS — E872 Acidosis, unspecified: Secondary | ICD-10-CM | POA: Diagnosis present

## 2022-10-11 DIAGNOSIS — E1122 Type 2 diabetes mellitus with diabetic chronic kidney disease: Secondary | ICD-10-CM | POA: Diagnosis present

## 2022-10-11 DIAGNOSIS — Z7901 Long term (current) use of anticoagulants: Secondary | ICD-10-CM | POA: Diagnosis not present

## 2022-10-11 DIAGNOSIS — J849 Interstitial pulmonary disease, unspecified: Secondary | ICD-10-CM | POA: Diagnosis present

## 2022-10-11 DIAGNOSIS — Z79890 Hormone replacement therapy (postmenopausal): Secondary | ICD-10-CM

## 2022-10-11 DIAGNOSIS — R6510 Systemic inflammatory response syndrome (SIRS) of non-infectious origin without acute organ dysfunction: Secondary | ICD-10-CM

## 2022-10-11 DIAGNOSIS — Z853 Personal history of malignant neoplasm of breast: Secondary | ICD-10-CM | POA: Diagnosis not present

## 2022-10-11 DIAGNOSIS — Z825 Family history of asthma and other chronic lower respiratory diseases: Secondary | ICD-10-CM

## 2022-10-11 DIAGNOSIS — E1165 Type 2 diabetes mellitus with hyperglycemia: Secondary | ICD-10-CM | POA: Diagnosis present

## 2022-10-11 DIAGNOSIS — I2510 Atherosclerotic heart disease of native coronary artery without angina pectoris: Secondary | ICD-10-CM | POA: Diagnosis present

## 2022-10-11 DIAGNOSIS — Z7984 Long term (current) use of oral hypoglycemic drugs: Secondary | ICD-10-CM

## 2022-10-11 DIAGNOSIS — I130 Hypertensive heart and chronic kidney disease with heart failure and stage 1 through stage 4 chronic kidney disease, or unspecified chronic kidney disease: Secondary | ICD-10-CM | POA: Diagnosis present

## 2022-10-11 DIAGNOSIS — I5032 Chronic diastolic (congestive) heart failure: Secondary | ICD-10-CM | POA: Diagnosis present

## 2022-10-11 DIAGNOSIS — E039 Hypothyroidism, unspecified: Secondary | ICD-10-CM | POA: Diagnosis present

## 2022-10-11 DIAGNOSIS — E782 Mixed hyperlipidemia: Secondary | ICD-10-CM | POA: Diagnosis present

## 2022-10-11 DIAGNOSIS — Z881 Allergy status to other antibiotic agents status: Secondary | ICD-10-CM

## 2022-10-11 DIAGNOSIS — N390 Urinary tract infection, site not specified: Secondary | ICD-10-CM | POA: Diagnosis present

## 2022-10-11 DIAGNOSIS — Z1152 Encounter for screening for COVID-19: Secondary | ICD-10-CM | POA: Diagnosis not present

## 2022-10-11 DIAGNOSIS — N179 Acute kidney failure, unspecified: Secondary | ICD-10-CM | POA: Diagnosis present

## 2022-10-11 LAB — URINALYSIS, W/ REFLEX TO CULTURE (INFECTION SUSPECTED)
Bilirubin Urine: NEGATIVE
Glucose, UA: NEGATIVE mg/dL
Ketones, ur: NEGATIVE mg/dL
Nitrite: POSITIVE — AB
Protein, ur: NEGATIVE mg/dL
Specific Gravity, Urine: 1.01 (ref 1.005–1.030)
pH: 6 (ref 5.0–8.0)

## 2022-10-11 LAB — HEPATIC FUNCTION PANEL
ALT: 11 U/L (ref 0–44)
AST: 25 U/L (ref 15–41)
Albumin: 2.9 g/dL — ABNORMAL LOW (ref 3.5–5.0)
Alkaline Phosphatase: 60 U/L (ref 38–126)
Bilirubin, Direct: 0.3 mg/dL — ABNORMAL HIGH (ref 0.0–0.2)
Indirect Bilirubin: 0.7 mg/dL (ref 0.3–0.9)
Total Bilirubin: 1 mg/dL (ref 0.3–1.2)
Total Protein: 8.1 g/dL (ref 6.5–8.1)

## 2022-10-11 LAB — BASIC METABOLIC PANEL
Anion gap: 10 (ref 5–15)
BUN: 33 mg/dL — ABNORMAL HIGH (ref 8–23)
CO2: 19 mmol/L — ABNORMAL LOW (ref 22–32)
Calcium: 8.5 mg/dL — ABNORMAL LOW (ref 8.9–10.3)
Chloride: 97 mmol/L — ABNORMAL LOW (ref 98–111)
Creatinine, Ser: 1.95 mg/dL — ABNORMAL HIGH (ref 0.44–1.00)
GFR, Estimated: 26 mL/min — ABNORMAL LOW (ref >60)
Glucose, Bld: 117 mg/dL — ABNORMAL HIGH (ref 70–99)
Potassium: 3.8 mmol/L (ref 3.5–5.1)
Sodium: 126 mmol/L — ABNORMAL LOW (ref 135–145)

## 2022-10-11 LAB — CBC
HCT: 36.2 % (ref 36.0–46.0)
Hemoglobin: 11.8 g/dL — ABNORMAL LOW (ref 12.0–15.0)
MCH: 29 pg (ref 26.0–34.0)
MCHC: 32.6 g/dL (ref 30.0–36.0)
MCV: 88.9 fL (ref 80.0–100.0)
Platelets: 212 10*3/uL (ref 150–400)
RBC: 4.07 MIL/uL (ref 3.87–5.11)
RDW: 13.7 % (ref 11.5–15.5)
WBC: 16.5 10*3/uL — ABNORMAL HIGH (ref 4.0–10.5)
nRBC: 0 % (ref 0.0–0.2)

## 2022-10-11 LAB — RESP PANEL BY RT-PCR (RSV, FLU A&B, COVID)  RVPGX2
Influenza A by PCR: NEGATIVE
Influenza B by PCR: NEGATIVE
Resp Syncytial Virus by PCR: NEGATIVE
SARS Coronavirus 2 by RT PCR: NEGATIVE

## 2022-10-11 LAB — GLUCOSE, CAPILLARY: Glucose-Capillary: 225 mg/dL — ABNORMAL HIGH (ref 70–99)

## 2022-10-11 LAB — SARS CORONAVIRUS 2 BY RT PCR: SARS Coronavirus 2 by RT PCR: NEGATIVE

## 2022-10-11 LAB — CREATININE, URINE, RANDOM: Creatinine, Urine: 75 mg/dL

## 2022-10-11 LAB — TROPONIN I (HIGH SENSITIVITY)
Troponin I (High Sensitivity): 14 ng/L (ref <18)
Troponin I (High Sensitivity): 15 ng/L (ref <18)

## 2022-10-11 LAB — SODIUM, URINE, RANDOM: Sodium, Ur: 37 mmol/L

## 2022-10-11 LAB — STREP PNEUMONIAE URINARY ANTIGEN: Strep Pneumo Urinary Antigen: POSITIVE — AB

## 2022-10-11 LAB — MAGNESIUM: Magnesium: 1.7 mg/dL (ref 1.7–2.4)

## 2022-10-11 LAB — TSH: TSH: 4.162 u[IU]/mL (ref 0.350–4.500)

## 2022-10-11 LAB — MRSA NEXT GEN BY PCR, NASAL: MRSA by PCR Next Gen: NOT DETECTED

## 2022-10-11 LAB — BRAIN NATRIURETIC PEPTIDE: B Natriuretic Peptide: 403.8 pg/mL — ABNORMAL HIGH (ref 0.0–100.0)

## 2022-10-11 LAB — I-STAT CG4 LACTIC ACID, ED
Lactic Acid, Venous: 2.5 mmol/L (ref 0.5–1.9)
Lactic Acid, Venous: 1.7 mmol/L (ref 0.5–1.9)

## 2022-10-11 LAB — PROCALCITONIN: Procalcitonin: 7.05 ng/mL

## 2022-10-11 LAB — OSMOLALITY, URINE: Osmolality, Ur: 374 mosm/kg (ref 300–900)

## 2022-10-11 LAB — D-DIMER, QUANTITATIVE: D-Dimer, Quant: 2.83 ug{FEU}/mL — ABNORMAL HIGH (ref 0.00–0.50)

## 2022-10-11 MED ORDER — HYDROXYCHLOROQUINE SULFATE 200 MG PO TABS
200.0000 mg | ORAL_TABLET | Freq: Every day | ORAL | Status: DC
  Administered 2022-10-11 – 2022-10-12 (×2): 200 mg via ORAL
  Filled 2022-10-11 (×2): qty 1

## 2022-10-11 MED ORDER — SODIUM CHLORIDE 0.9 % IV SOLN
1.0000 g | Freq: Once | INTRAVENOUS | Status: AC
  Administered 2022-10-11: 1 g via INTRAVENOUS
  Filled 2022-10-11: qty 10

## 2022-10-11 MED ORDER — ACETAMINOPHEN 325 MG PO TABS
650.0000 mg | ORAL_TABLET | Freq: Once | ORAL | Status: AC
  Administered 2022-10-11: 650 mg via ORAL
  Filled 2022-10-11: qty 2

## 2022-10-11 MED ORDER — SODIUM CHLORIDE 0.9% FLUSH
3.0000 mL | Freq: Two times a day (BID) | INTRAVENOUS | Status: DC
  Administered 2022-10-11 – 2022-10-16 (×7): 3 mL via INTRAVENOUS

## 2022-10-11 MED ORDER — INSULIN ASPART 100 UNIT/ML IJ SOLN: 0.0000 [IU] | Freq: Three times a day (TID) | INTRAMUSCULAR | Status: DC

## 2022-10-11 MED ORDER — LACTATED RINGERS IV BOLUS (SEPSIS)
500.0000 mL | Freq: Once | INTRAVENOUS | Status: AC
  Administered 2022-10-11: 500 mL via INTRAVENOUS

## 2022-10-11 MED ORDER — SODIUM CHLORIDE 0.9 % IV SOLN
500.0000 mg | Freq: Once | INTRAVENOUS | Status: AC
  Administered 2022-10-11: 500 mg via INTRAVENOUS
  Filled 2022-10-11: qty 5

## 2022-10-11 MED ORDER — SODIUM CHLORIDE 0.9 % IV BOLUS
1000.0000 mL | Freq: Once | INTRAVENOUS | Status: AC
  Administered 2022-10-11: 1000 mL via INTRAVENOUS

## 2022-10-11 MED ORDER — METHYLPREDNISOLONE SODIUM SUCC 125 MG IJ SOLR
125.0000 mg | Freq: Once | INTRAMUSCULAR | Status: AC
  Administered 2022-10-11: 125 mg via INTRAVENOUS
  Filled 2022-10-11: qty 2

## 2022-10-11 MED ORDER — HEPARIN (PORCINE) 25000 UT/250ML-% IV SOLN
1250.0000 [IU]/h | INTRAVENOUS | Status: AC
  Administered 2022-10-11: 1000 [IU]/h via INTRAVENOUS
  Filled 2022-10-11 (×3): qty 250

## 2022-10-11 MED ORDER — MAGNESIUM SULFATE IN D5W 1-5 GM/100ML-% IV SOLN
1.0000 g | Freq: Once | INTRAVENOUS | Status: AC
  Administered 2022-10-11: 1 g via INTRAVENOUS
  Filled 2022-10-11: qty 100

## 2022-10-11 MED ORDER — RISAQUAD PO CAPS
1.0000 | ORAL_CAPSULE | Freq: Two times a day (BID) | ORAL | Status: DC
  Administered 2022-10-11 – 2022-10-16 (×10): 1 via ORAL
  Filled 2022-10-11 (×12): qty 1

## 2022-10-11 MED ORDER — AZITHROMYCIN 250 MG PO TABS: 500.0000 mg | ORAL_TABLET | Freq: Every day | ORAL | Status: DC

## 2022-10-11 MED ORDER — SODIUM CHLORIDE 0.9 % IV SOLN: INTRAVENOUS | Status: DC

## 2022-10-11 MED ORDER — GUAIFENESIN ER 600 MG PO TB12
600.0000 mg | ORAL_TABLET | Freq: Two times a day (BID) | ORAL | Status: DC
  Administered 2022-10-11 – 2022-10-12 (×2): 600 mg via ORAL
  Filled 2022-10-11 (×2): qty 1

## 2022-10-11 MED ORDER — SODIUM CHLORIDE 0.9 % IV SOLN
2.0000 g | INTRAVENOUS | Status: DC
  Administered 2022-10-12 – 2022-10-14 (×3): 2 g via INTRAVENOUS
  Filled 2022-10-11 (×3): qty 20

## 2022-10-11 MED ORDER — INSULIN ASPART 100 UNIT/ML IJ SOLN
0.0000 [IU] | Freq: Three times a day (TID) | INTRAMUSCULAR | Status: DC
  Administered 2022-10-12: 1 [IU] via SUBCUTANEOUS
  Administered 2022-10-12 – 2022-10-13 (×3): 2 [IU] via SUBCUTANEOUS
  Administered 2022-10-13: 1 [IU] via SUBCUTANEOUS
  Administered 2022-10-13: 2 [IU] via SUBCUTANEOUS
  Administered 2022-10-14 – 2022-10-16 (×5): 1 [IU] via SUBCUTANEOUS

## 2022-10-11 MED ORDER — SODIUM CHLORIDE 0.9 % IV SOLN
100.0000 mg | Freq: Two times a day (BID) | INTRAVENOUS | Status: DC
  Administered 2022-10-12 – 2022-10-13 (×3): 100 mg via INTRAVENOUS
  Filled 2022-10-11 (×3): qty 100

## 2022-10-11 MED ORDER — INSULIN ASPART 100 UNIT/ML IJ SOLN
0.0000 [IU] | Freq: Every day | INTRAMUSCULAR | Status: DC
  Administered 2022-10-11: 2 [IU] via SUBCUTANEOUS

## 2022-10-11 MED ORDER — DILTIAZEM HCL-DEXTROSE 125-5 MG/125ML-% IV SOLN (PREMIX)
5.0000 mg/h | INTRAVENOUS | Status: DC
  Administered 2022-10-11 – 2022-10-13 (×3): 5 mg/h via INTRAVENOUS
  Filled 2022-10-11 (×3): qty 125

## 2022-10-11 MED ORDER — IPRATROPIUM-ALBUTEROL 0.5-2.5 (3) MG/3ML IN SOLN
3.0000 mL | RESPIRATORY_TRACT | Status: DC | PRN
  Administered 2022-10-13: 3 mL via RESPIRATORY_TRACT
  Filled 2022-10-11: qty 3

## 2022-10-11 NOTE — H&P (Signed)
History and Physical    Patient: [name] ZOX:[id number] DOB: Mar 24, 1940 DOA: 10/11/2022 DOS: the patient was seen and examined on 10/11/2022 PCP: Merri Brunette, MD  Patient coming from: Home via EMS  Chief Complaint:  Chief Complaint  Patient presents with   Shortness of Breath   Atrial Flutter   HPI: [name] is a 82 y.o. female with medical history significant of hypertension, CAD, paroxysmal atrial flutter, interstitial lung disease, diabetes mellitus type 2, CKD stage III, SLE, hypothyroidism, and remote history of breast cancer s/p lumpectomy who presents with complaints of cough and shortness of breath.  Symptoms started about a week ago with sinus congestion.  She thereafter developed a productive cough and reported subjective fever and diarrhea.  She has had epigastric discomfort when coughing.  Denies having any nausea or vomiting symptoms.  Patient notes that one of her grandchildren was recently ill with similar symptoms.  She had been seen in the urgent care on 9/8 and evaluated and had negative COVID-19 test.  Symptoms thought to be secondary to a viral upper respiratory infection for which she was given prescriptions for Tessalon Perles and Flonase.  En route with EMS patient was noted to have heart rates into the 180s.  She was given 10 mg of diltiazem IV and 500 mL bolus of normal saline IV fluids.  Upon admission into the emergency department patient was noted to have temperature up to 100.1 F with heart rates elevated into the 140s, respirations 16-34, blood pressures 118/77 and 152/86, and O2 saturation maintained on room air.  Labs significant for WBC 16.5, hemoglobin 11.8, sodium 126, BUN 33, creatinine 1.95, glucose 117, BNP 403.8, high-sensitivity troponin 14, and lactic acid 2.5.  Chest x-ray noted findings consistent with interstitial disease with no significant evidence of edema or focal infiltrate.  Patient had been given 1.5 L normal saline IV fluids,  Solu-Medrol 125 mg IV, acetaminophen 650 mg p.o., Rocephin, azithromycin, heparin drip, and diltiazem drip.    Review of Systems: As mentioned in the history of present illness. All other systems reviewed and are negative. Past Medical History:  Diagnosis Date   Acute bronchitis 12/17/2021   Acute cystitis 02/20/2019   Breast cancer (HCC)    Diabetes mellitus without complication (HCC)    Hypertension    Hypothyroidism    Lupus (HCC)    Pneumonia 07/31/2021   Past Surgical History:  Procedure Laterality Date   BREAST LUMPECTOMY  2009   IR 3D INDEPENDENT WKST  05/29/2021   IR ANGIO INTRA EXTRACRAN SEL COM CAROTID INNOMINATE BILAT MOD SED  05/29/2021   IR ANGIO VERTEBRAL SEL VERTEBRAL BILAT MOD SED  05/29/2021   Social History:  reports that she has never smoked. She quit smokeless tobacco use about 7 years ago.  Her smokeless tobacco use included snuff. She reports that she does not drink alcohol and does not use drugs.  Allergies  Allergen Reactions   Ciprofloxacin Itching, Swelling and Other (See Comments)    Possibly causing tremors?   Lisinopril Cough   Crestor [Rosuvastatin] Other (See Comments)    Myalgia and back pain   Ofev [Nintedanib] Nausea Only    Family History  Problem Relation Age of Onset   Cancer Father        ? type   Asthma Brother     Prior to Admission medications   Medication Sig Start Date End Date Taking? Authorizing Provider  albuterol (VENTOLIN HFA) 108 (90 Base) MCG/ACT inhaler Inhale 2  puffs into the lungs every 4 (four) hours as needed for wheezing or shortness of breath. 07/03/21   Omar Person, MD  amLODipine (NORVASC) 5 MG tablet TAKE ONE TABLET BY MOUTH EVERY EVENING 08/12/22   Yates Decamp, MD  benzonatate (TESSALON) 100 MG capsule Take 1 capsule (100 mg total) by mouth every 8 (eight) hours as needed for cough. 10/04/22   Gustavus Bryant, FNP  bisoprolol (ZEBETA) 5 MG tablet Take 1 tablet (5 mg total) by mouth daily. 04/28/22   Ghimire, Werner Lean, MD  diclofenac Sodium (VOLTAREN) 1 % GEL Apply 1 application. topically 4 (four) times daily as needed (pain).    [provider]  fluticasone (FLONASE) 50 MCG/ACT nasal spray Place 1 spray into both nostrils daily. 10/04/22   Gustavus Bryant, FNP  gabapentin (NEURONTIN) 300 MG capsule Take 300 mg by mouth every 8 (eight) hours.    [provider]  glimepiride (AMARYL) 1 MG tablet Take 1 mg by mouth daily with breakfast.    [provider]  hydroxychloroquine (PLAQUENIL) 200 MG tablet Take 1 tablet (200 mg total) by mouth daily. 01/06/15   Jeralyn Bennett, MD  ibuprofen (ADVIL) 200 MG tablet Take 200 mg by mouth every 6 (six) hours as needed for moderate pain or headache.    [provider]  ipratropium-albuterol (DUONEB) 0.5-2.5 (3) MG/3ML SOLN Take 3 mLs by nebulization every 6 (six) hours as needed. 07/11/21   Omar Person, MD  levothyroxine (SYNTHROID) 100 MCG tablet Take 100 mcg by mouth daily. 02/05/21   [provider]  omeprazole (PRILOSEC) 20 MG capsule Take 20 mg by mouth daily before breakfast.    [provider]  spironolactone (ALDACTONE) 25 MG tablet Take 0.5 tablets (12.5 mg total) by mouth every morning. 09/08/22 12/07/22  Yates Decamp, MD  Tiotropium Bromide Monohydrate (SPIRIVA RESPIMAT) 1.25 MCG/ACT AERS Inhale 2 puffs into the lungs daily. Patient not taking: Reported on 09/15/2019 08/17/19 11/29/19  Kalman Shan, MD    Physical Exam: Vitals:   10/11/22 1515 10/11/22 1530 10/11/22 1545 10/11/22 1600  BP: (!) 152/86 134/82 126/83 118/77  Pulse: (!) 136 (!) 137 (!) 140 (!) 139  Resp: (!) 28 (!) 25 (!) 34 16  Temp:      SpO2: 99% 98% 100% 99%  Weight:      Height:         Constitutional: Elderly female who appears to be sick but in no acute distress Eyes: PERRL, lids and conjunctivae normal ENMT: Mucous membranes are moist. Posterior pharynx clear of any exudate or lesions.  Neck: normal, supple  Respiratory:  Tachypneic with lower lung rhonchi appreciated.  O2 saturation currently maintained on room air. Cardiovascular: regularly irregular and tachycardic. No extremity edema. 2+ pedal pulses.    Abdomen: no tenderness, no masses palpated.  Bowel sounds positive.  Musculoskeletal: no clubbing / cyanosis. No joint deformity upper and lower extremities. Good ROM, no contractures.   Skin: no rashes, lesions, ulcers. No induration Neurologic: CN 2-12 grossly intact.   Strength 5/5 in all 4.  Psychiatric: Normal judgment and insight. Alert and oriented x 3. Normal mood.   Data Reviewed:  EKG appears to show a atrial flutter QTc prolonged at 558.  Reviewed labs, imaging, pertinent records as documented in this note.  Assessment and Plan:  Sepsis secondary to suspected setting community-acquired pneumonia Patient presents with complaints of cough, shortness of breath, and malaise.  Chest x-ray noted chronic interstitial lung disease of lower  zones.  Chest x-ray noted chronic interstitial lung disease without clear focal infiltrate.  Previously had negative COVID-19 screening performed on 9/8.  Patient was noted to be tachycardic, tachypneic, and have elevated white blood cell count of 16.52 meeting SIRS criteria.  Lactic acid seem to be reassuring at 1.7.  Patient had recently sick contacts of one of her  -Admit to a progressive bed -Continuous pulse oximetry with nasal cannula oxygen maintain O2 saturation greater than 90% -Follow-up respiratory virus panel pending -Follow-up blood and sputum cultures -Check urine strep and urine legionella -Check procalcitonin -Continue empiric antibiotic Rocephin and Doxycyclin -Mucinex -Tylenol as needed for fever  Addendum: UTI Urinalysis was small leukocytosis, positive leukocytosis, rare bacteria, and wbc 21-50. -Check urine culture -Antibiotics as noted above  Paroxysmal atrial flutter Acute.  Patient appears to be in atrial flutter records note similar  reports of atrial flutter back in 2021 when patient was admitted with syncope.  Long-term monitoring however had not noted any concern for this.  Patient reports that she had temporarily been on anticoagulation, but was stopped thereafter. CHA2DS2-VASc score = at 5. -Goal potassium at least 4 and magnesium at least 2.  Replace as needed -Add on D-dimer -Continue Cardizem drip -Continue heparin per pharmacy [facility] cardiology consulted for follow-up for any further recommendations  Acute kidney injury Patient presents with creatinine elevated at 1.95 with BUN 35.  Suspect likely multifactorial in the setting of acute infection and elevated heart rates.  Patient had been given IV fluids. -Check urinalysis -Nephrotoxic agents  Hyponatremia Acute on chronic.  Initial sodium noted to be 126.  Suspect hypovolemic-report of symptoms. -Check urine na -Continue to monitor sodium level  Diarrhea Patient reports having several episodes of diarrhea.  Denies having any complaints of abdominal pain except when coughing. -Monitor intake and output -Consider Imodium if diarrhea persist  Diastolic congestive heart failure Patient does not appear grossly fluid overloaded on physical exam.  BNP elevated at 403.  Last echocardiogram from 04/2022 noted EF to be 60-65 percent with grade 1 diastolic dysfunction. -Daily weights  Diabetes mellitus type 2, without long-term use of insulin Last hemoglobin A1c was 6.7 on 04/25/2022.  On admission glucose 117. -Hypoglycemic protocols -Hold Amaryl -CBGs before every meal with  sensitive SSI   -Adjust regimen as needed  Interstitial lung diseas Lupus/autoimmune disease -Continue Plaquenil -Continue outpatient follow-up with pulmonology and rheumatology  Hypothyroidism Patient reported that she was no longer taking levothyroxine. -Check TSH -Consider resuming levothyroxine at a lower dose thyroid studies result  Will need to review home medication list  and resume when medically appropriate  DVT prophylaxis: Heparin Advance Care Planning:   Code Status: Full Code   Consults: Cardiology  Family Communication:   Severity of Illness: The appropriate patient status for this patient is INPATIENT. Inpatient status is judged to be reasonable and necessary in order to provide the required intensity of service to ensure the patient's safety. The patient's presenting symptoms, physical exam findings, and initial radiographic and laboratory data in the context of their chronic comorbidities is felt to place them at high risk for further clinical deterioration. Furthermore, it is not anticipated that the patient will be medically stable for discharge from the hospital within 2 midnights of admission.   * I certify that at the point of admission it is my clinical judgment that the patient will require inpatient hospital care spanning beyond 2 midnights from the point of admission due to high intensity of service, high risk for  further deterioration and high frequency of surveillance required.*  Author: Clydie Braun, MD 10/11/2022 4:04 PM  For on call review [email or web address].

## 2022-10-11 NOTE — ED Provider Notes (Signed)
[place] EMERGENCY DEPARTMENT AT [facility] Provider Note   CSN: [id number] Arrival date & time: 10/11/22  1420     History  Chief Complaint  Patient presents with   Shortness of Breath   Atrial Flutter    [name] is a 82 y.o. female.   Shortness of Breath Associated symptoms: cough   Patient presents for fatigue, generalized weakness, cough, shortness of breath that has been worsening over the past week.  Her medical history includes hypothyroidism, HTN, SLE, asthma, interstitial lung disease, DM.  EMS was called to her home today.  Initially, heart rate was in the 180s.  She was given 10 mg of diltiazem.  Since then, heart rate has been sustained in the mid 140s.  Patient denies any current palpitations.  She does endorse ongoing fatigue and generalized weakness.  She is not on home oxygen.  EMS noted hypoxia and mid to high 80s.  She was placed on supplemental oxygen by nasal cannula.  Blood pressures with EMS have been in the 120s range.     Home Medications Prior to Admission medications   Medication Sig Start Date End Date Taking? Authorizing Provider  albuterol (VENTOLIN HFA) 108 (90 Base) MCG/ACT inhaler Inhale 2 puffs into the lungs every 4 (four) hours as needed for wheezing or shortness of breath. 07/03/21   Omar Person, MD  amLODipine (NORVASC) 5 MG tablet TAKE ONE TABLET BY MOUTH EVERY EVENING 08/12/22   Yates Decamp, MD  benzonatate (TESSALON) 100 MG capsule Take 1 capsule (100 mg total) by mouth every 8 (eight) hours as needed for cough. 10/04/22   Gustavus Bryant, FNP  bisoprolol (ZEBETA) 5 MG tablet Take 1 tablet (5 mg total) by mouth daily. 04/28/22   Ghimire, Werner Lean, MD  diclofenac Sodium (VOLTAREN) 1 % GEL Apply 1 application. topically 4 (four) times daily as needed (pain).    [provider]  fluticasone (FLONASE) 50 MCG/ACT nasal spray Place 1 spray into both nostrils daily. 10/04/22   Gustavus Bryant, FNP  gabapentin (NEURONTIN) 300  MG capsule Take 300 mg by mouth every 8 (eight) hours.    [provider]  glimepiride (AMARYL) 1 MG tablet Take 1 mg by mouth daily with breakfast.    [provider]  hydroxychloroquine (PLAQUENIL) 200 MG tablet Take 1 tablet (200 mg total) by mouth daily. 01/06/15   Jeralyn Bennett, MD  ibuprofen (ADVIL) 200 MG tablet Take 200 mg by mouth every 6 (six) hours as needed for moderate pain or headache.    [provider]  ipratropium-albuterol (DUONEB) 0.5-2.5 (3) MG/3ML SOLN Take 3 mLs by nebulization every 6 (six) hours as needed. 07/11/21   Omar Person, MD  levothyroxine (SYNTHROID) 100 MCG tablet Take 100 mcg by mouth daily. 02/05/21   [provider]  omeprazole (PRILOSEC) 20 MG capsule Take 20 mg by mouth daily before breakfast.    [provider]  spironolactone (ALDACTONE) 25 MG tablet Take 0.5 tablets (12.5 mg total) by mouth every morning. 09/08/22 12/07/22  Yates Decamp, MD  Tiotropium Bromide Monohydrate (SPIRIVA RESPIMAT) 1.25 MCG/ACT AERS Inhale 2 puffs into the lungs daily. Patient not taking: Reported on 09/15/2019 08/17/19 11/29/19  Kalman Shan, MD      Allergies    Ciprofloxacin, Lisinopril, Crestor [rosuvastatin], and Ofev [nintedanib]    Review of Systems   Review of Systems  Constitutional:  Positive for fatigue.  Respiratory:  Positive for cough and shortness of breath.  Gastrointestinal:  Positive for diarrhea.  Neurological:  Positive for weakness (Generalized).  All other systems reviewed and are negative.   Physical Exam Updated Vital Signs BP (!) 152/86   Pulse (!) 136   Temp 100.1 F (37.8 C)   Resp (!) 28   Ht 5\' 7"  (1.702 m)   Wt 63.5 kg   SpO2 99%   BMI 21.93 kg/m  Physical Exam Vitals and nursing note reviewed.  Constitutional:      General: She is not in acute distress.    Appearance: She is well-developed. She is not toxic-appearing or diaphoretic.  HENT:     Head: Normocephalic and  atraumatic.     Mouth/Throat:     Mouth: Mucous membranes are moist.  Eyes:     Conjunctiva/sclera: Conjunctivae normal.  Cardiovascular:     Rate and Rhythm: Regular rhythm. Tachycardia present.     Heart sounds: No murmur heard. Pulmonary:     Effort: Pulmonary effort is normal. Tachypnea present. No respiratory distress.     Breath sounds: Rhonchi present.  Abdominal:     Palpations: Abdomen is soft.     Tenderness: There is no abdominal tenderness.  Musculoskeletal:        General: No swelling. Normal range of motion.     Cervical back: Neck supple.     Right lower leg: No edema.     Left lower leg: No edema.  Skin:    General: Skin is warm and dry.     Capillary Refill: Capillary refill takes less than 2 seconds.     Coloration: Skin is not cyanotic or pale.  Neurological:     General: No focal deficit present.     Mental Status: She is alert and oriented to person, place, and time.  Psychiatric:        Mood and Affect: Mood normal.        Behavior: Behavior normal.     ED Results / Procedures / Treatments   Labs (all labs ordered are listed, but only abnormal results are displayed) Labs Reviewed  BASIC METABOLIC PANEL - Abnormal; Notable for the following components:      Result Value   Sodium 126 (*)    Chloride 97 (*)    CO2 19 (*)    Glucose, Bld 117 (*)    BUN 33 (*)    Creatinine, Ser 1.95 (*)    Calcium 8.5 (*)    GFR, Estimated 26 (*)    All other components within normal limits  CBC - Abnormal; Notable for the following components:   WBC 16.5 (*)    Hemoglobin 11.8 (*)    All other components within normal limits  BRAIN NATRIURETIC PEPTIDE - Abnormal; Notable for the following components:   B Natriuretic Peptide 403.8 (*)    All other components within normal limits  I-STAT CG4 LACTIC ACID, ED - Abnormal; Notable for the following components:   Lactic Acid, Venous 2.5 (*)    All other components within normal limits  CULTURE, BLOOD (ROUTINE X 2)   CULTURE, BLOOD (ROUTINE X 2)  RESP PANEL BY RT-PCR (RSV, FLU A&B, COVID)  RVPGX2  URINALYSIS, W/ REFLEX TO CULTURE (INFECTION SUSPECTED)  HEPATIC FUNCTION PANEL  MAGNESIUM  HEPARIN LEVEL (UNFRACTIONATED)  TROPONIN I (HIGH SENSITIVITY)    EKG EKG Interpretation Date/Time:  Sunday October 11 2022 14:29:58 EDT Ventricular Rate:  138 PR Interval:  169 QRS Duration:  92 QT Interval:  368 QTC Calculation: 558 R Axis:   -  40  Text Interpretation: Sinus or ectopic atrial tachycardia Left axis deviation Probable anterior infarct, age indeterminate Abnormal T, consider ischemia, diffuse leads Lateral leads are also involved Prolonged QT interval Confirmed by Gloris Manchester [phone]) on 10/11/2022 3:10:56 PM  Radiology DG Chest Portable 1 View  Result Date: 10/11/2022 CLINICAL DATA:  Short of breath. EXAM: PORTABLE CHEST 1 VIEW COMPARISON:  06/01/2022 and older exams.  CT, 06/26/2022. FINDINGS: Mild enlargement of cardiac silhouette. No mediastinal or hilar masses. Coarse interstitial thickening in the lower lungs and inferior aspect of the right upper lobe with associated ground-glass type opacity. Lungs hyperexpanded but otherwise clear. No convincing pleural effusion.  No pneumothorax. Skeletal structures grossly intact. IMPRESSION: 1. 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 Cannot exclude superimposed infection. No evidence of pulmonary edema. 2. Mild stable cardiomegaly. Electronically Signed   By: Amie Portland M.D.   On: 10/11/2022 15:00    Procedures Procedures    Medications Ordered in ED Medications  azithromycin (ZITHROMAX) 500 mg in sodium chloride 0.9 % 250 mL IVPB (500 mg Intravenous New Bag/Given 10/11/22 1535)  sodium chloride 0.9 % bolus 1,000 mL (has no administration in time range)  diltiazem (CARDIZEM) 125 mg in dextrose 5% 125 mL (1 mg/mL) infusion (has no administration in time range)  heparin ADULT infusion 100 units/mL (25000  units/262mL) (has no administration in time range)  lactated ringers bolus 500 mL (0 mLs Intravenous Stopped 10/11/22 1529)  acetaminophen (TYLENOL) tablet 650 mg (650 mg Oral Given 10/11/22 1504)  cefTRIAXone (ROCEPHIN) 1 g in sodium chloride 0.9 % 100 mL IVPB (0 g Intravenous Stopped 10/11/22 1535)    ED Course/ Medical Decision Making/ A&P                                 Medical Decision Making Amount and/or Complexity of Data Reviewed Labs: ordered. Radiology: ordered.  Risk OTC drugs. Prescription drug management. Decision regarding hospitalization.   This patient presents to the ED for concern of fatigue and shortness of breath, this involves an extensive number of treatment options, and is a complaint that carries with it a high risk of complications and morbidity.  The differential diagnosis includes URI, pneumonia, PE, ACS, arrhythmia, dehydration, metabolic derangements, acidosis, other infection, exacerbation of chronic lung disease, complication of lupus   Co morbidities that complicate the patient evaluation  hypothyroidism, HTN, SLE, asthma, interstitial lung disease, DM   Additional history obtained:  Additional history obtained from EMS External records from outside source obtained and reviewed including EMR   Lab Tests:  I Ordered, and personally interpreted labs.  The pertinent results include: Leukocytosis and lactic acidosis are present consistent with sepsis.  AKI is present.  Hyponatremia and hypochloremia are present in addition to a non-anion gap metabolic acidosis.  BNP is moderately elevated.  Anemia is baseline.   Imaging Studies ordered:  I ordered imaging studies including chest x-ray I independently visualized and interpreted imaging which showed bibasilar opacities I agree with the radiologist interpretation   Cardiac Monitoring: / EKG:  The patient was maintained on a cardiac monitor.  I personally viewed and interpreted the cardiac  monitored which showed an underlying rhythm of: Narrow complex regular rhythm tachycardia consistent with atrial flutter  Problem List / ED Course / Critical interventions / Medication management  Patient presents for fatigue and weakness that has been worsening over the past week.  She has had URI  symptoms of cough and shortness of breath.  She also reports diarrhea.  She has had decreased p.o. intake.  EMS noted narrow complex tachycardia in the range of 180s on their arrival.  She received 10 mg of diltiazem and 500 cc IVF prior to arrival.  Heart rate improved to the 140s.  On exam, patient is alert and oriented.  She is mildly tachypneic but breathing is unlabored at this time.  She has rhonchorous breath sounds on lung auscultation.  Initial EKG shows indeterminate rhythm but possibly atrial flutter with 2:1 conduction.  She was found to have a low-grade temperature on arrival.  Given her diarrhea and poor p.o. intake, patient was given additional IV fluids for hydration.  Tylenol was ordered for antipyresis.  On cardiac monitor, she did have some irregularity to her heart rate with intermittent sinus rhythm shortly after arrival.  Heart rate did momentarily come down to 110s.  Subsequently, it remained elevated in the 140s.  He was a narrow complex regular rhythm.  Blood pressures in the 130s SBP.  Diltiazem and heparin were ordered for treatment of atrial flutter.  Per chart review, she does not have a history of this.  Lab work is notable for leukocytosis AKI, mild hyponatremia, and lactic acidosis.  Patient was treated for pneumonia with antibiotics and additional IV fluid.  SpO2 remained normal on room air.  Initial BNP was moderately elevated.  Troponin was normal.  Patient was admitted to medicine for further management. I ordered medication including IV fluids for hydration; Tylenol for antipyresis; ceftriaxone and azithromycin for pneumonia; diltiazem and heparin for atrial flutter Reevaluation  of the patient after these medicines showed that the patient improved I have reviewed the patients home medicines and have made adjustments as needed   Social Determinants of Health:  Lives at home with family  CRITICAL CARE Performed by: Gloris Manchester   Total critical care time: 32 minutes  Critical care time was exclusive of separately billable procedures and treating other patients.  Critical care was necessary to treat or prevent imminent or life-threatening deterioration.  Critical care was time spent personally by me on the following activities: development of treatment plan with patient and/or surrogate as well as nursing, discussions with consultants, evaluation of patient's response to treatment, examination of patient, obtaining history from patient or surrogate, ordering and performing treatments and interventions, ordering and review of laboratory studies, ordering and review of radiographic studies, pulse oximetry and re-evaluation of patient's condition.         Final Clinical Impression(s) / ED Diagnoses Final diagnoses:  Pneumonia of both lower lobes due to infectious organism  Atrial flutter with rapid ventricular response (HCC)  Hyponatremia  SIRS (systemic inflammatory response syndrome) (HCC)    Rx / DC Orders ED Discharge Orders     None         Gloris Manchester, MD 10/11/22 [phone]

## 2022-10-11 NOTE — ED Notes (Signed)
Pt's complaining of general malaise for the last several days.  She's stated that she has had diarrhea, SOB, and weakness.  Pt denied chest pain, dizziness, headache, n/v/d.

## 2022-10-11 NOTE — ED Notes (Signed)
Called lab and added on magnesium and hepatic function

## 2022-10-11 NOTE — Progress Notes (Signed)
ANTICOAGULATION CONSULT NOTE - Initial Consult  Pharmacy Consult for Heparin Indication: atrial fibrillation  Allergies  Allergen Reactions   Ciprofloxacin Itching, Swelling and Other (See Comments)    Possibly causing tremors?   Lisinopril Cough   Crestor [Rosuvastatin] Other (See Comments)    Myalgia and back pain   Ofev [Nintedanib] Nausea Only    Patient Measurements: Height: 5\' 7"  (170.2 cm) Weight: 63.5 kg (140 lb) IBW/kg (Calculated) : 61.6 Heparin Dosing Weight: 63.5 kg  Vital Signs: Temp: 100.1 F (37.8 C) (09/15 1429) BP: 152/86 (09/15 1515) Pulse Rate: 136 (09/15 1515)  Labs: Recent Labs    10/11/22 1431  HGB 11.8*  HCT 36.2  PLT 212  CREATININE 1.95*  TROPONINIHS 14    Estimated Creatinine Clearance: 22.4 mL/min (A) (by C-G formula based on SCr of 1.95 mg/dL (H)).   Medical History: Past Medical History:  Diagnosis Date   Acute bronchitis 12/17/2021   Acute cystitis 02/20/2019   Breast cancer (HCC)    Diabetes mellitus without complication (HCC)    Hypertension    Hypothyroidism    Lupus (HCC)    Pneumonia 07/31/2021    Assessment: 82 yof with a history of SOB. Patient is presenting with hypothyroidism, HTN, SLE, asthma, ILD, DM. Heparin per pharmacy consult placed for atrial fibrillation.  Patient is not on anticoagulation prior to arrival.  Hgb 11.8; plt 212  Goal of Therapy:  Heparin level 0.3-0.7 units/ml Monitor platelets by anticoagulation protocol: Yes   Plan:  No initial heparin bolus Start heparin infusion at 1000 units/hr Check anti-Xa level in 8 hours and daily while on heparin Continue to monitor H&H and platelets  Delmar Landau, PharmD, BCPS 10/11/2022 3:45 PM ED Clinical Pharmacist -  [phone]

## 2022-10-11 NOTE — ED Notes (Signed)
Family requested update.  Provider was made aware via secure chat.

## 2022-10-11 NOTE — ED Notes (Signed)
ED TO INPATIENT HANDOFF REPORT  ED Nurse Name and Phone #: Dahlia Client Paramedic [phone]  S Name/Age/Gender [name] 82 y.o. female Room/Bed: 010C/010C  Code Status   Code Status: Prior  Home/SNF/Other Home Patient oriented to: self, place, time, and situation Is this baseline? Yes   Triage Complete: Triage complete  Chief Complaint Sepsis due to pneumonia (HCC) [J18.9, A41.9]  Triage Note PT arrives via EMS from home. Pt reports she has been feeling weak and fatigued for the past few days and is having sob with exertion. Upon EMS arrival, her HR was 180. EMS administered 10mg  of diltiazem and a ns bolus. PT is AxOx4. HR is currently in the 130s. PT denies cp at this time    Allergies Allergies  Allergen Reactions   Ciprofloxacin Itching, Swelling and Other (See Comments)    Possibly causing tremors?   Lisinopril Cough   Crestor [Rosuvastatin] Other (See Comments)    Myalgia and back pain   Ofev [Nintedanib] Nausea Only    Level of Care/Admitting Diagnosis ED Disposition     ED Disposition  Admit   Condition  --   Comment  Hospital Area: [facility] [[id number]]  Level of Care: Progressive [102]  Admit to Progressive based on following criteria: RESPIRATORY PROBLEMS hypoxemic/hypercapnic respiratory failure that is responsive to NIPPV (BiPAP) or High Flow Nasal Cannula (6-80 lpm). Frequent assessment/intervention, no > Q2 hrs < Q4 hrs, to maintain oxygenation and pulmonary hygiene.  May admit patient to [facility] or [facility] if equivalent level of care is available:: No  Covid Evaluation: Asymptomatic - no recent exposure (last 10 days) testing not required  Diagnosis: Sepsis due to pneumonia [facility]) [[id number]]  Admitting Physician: Clydie Braun [[id number]]  Attending Physician: Clydie Braun [[id number]]  Certification:: I certify this patient will need inpatient services for at least 2 midnights  Expected Medical Readiness: 10/13/2022           B Medical/Surgery History Past Medical History:  Diagnosis Date   Acute bronchitis 12/17/2021   Acute cystitis 02/20/2019   Breast cancer (HCC)    Diabetes mellitus without complication (HCC)    Hypertension    Hypothyroidism    Lupus (HCC)    Pneumonia 07/31/2021   Past Surgical History:  Procedure Laterality Date   BREAST LUMPECTOMY  2009   IR 3D INDEPENDENT WKST  05/29/2021   IR ANGIO INTRA EXTRACRAN SEL COM CAROTID INNOMINATE BILAT MOD SED  05/29/2021   IR ANGIO VERTEBRAL SEL VERTEBRAL BILAT MOD SED  05/29/2021     A IV Location/Drains/Wounds Patient Lines/Drains/Airways Status     Active Line/Drains/Airways     Name Placement date Placement time Site Days   Peripheral IV 10/11/22 20 G Left Antecubital 10/11/22  --  Antecubital  less than 1   Peripheral IV 10/11/22 20 G Anterior;Right Forearm 10/11/22  1449  Forearm  less than 1   Peripheral IV 10/11/22 22 G Posterior;Proximal;Right Forearm 10/11/22  1622  Forearm  less than 1   Wound / Incision (Open or Dehisced) 05/29/21 Puncture Groin Anterior;Proximal;Right Arterial access puncture site 05/29/21  1120  Groin  500            Intake/Output Last 24 hours No intake or output data in the 24 hours ending 10/11/22 1628  Labs/Imaging Results for orders placed or performed during the hospital encounter of 10/11/22 (from the past 48 hour(s))  Basic metabolic panel     Status: Abnormal   Collection  Time: 10/11/22  2:31 PM  Result Value Ref Range   Sodium 126 (L) 135 - 145 mmol/L   Potassium 3.8 3.5 - 5.1 mmol/L   Chloride 97 (L) 98 - 111 mmol/L   CO2 19 (L) 22 - 32 mmol/L   Glucose, Bld 117 (H) 70 - 99 mg/dL    Comment: Glucose reference range applies only to samples taken after fasting for at least 8 hours.   BUN 33 (H) 8 - 23 mg/dL   Creatinine, Ser 7.82 (H) 0.44 - 1.00 mg/dL   Calcium 8.5 (L) 8.9 - 10.3 mg/dL   GFR, Estimated 26 (L) >60 mL/min    Comment: (NOTE) Calculated using the CKD-EPI Creatinine  Equation (2021)    Anion gap 10 5 - 15    Comment: Performed at [facility], [address] [place], [place] [address]  CBC     Status: Abnormal   Collection Time: 10/11/22  2:31 PM  Result Value Ref Range   WBC 16.5 (H) 4.0 - 10.5 K/uL   RBC 4.07 3.87 - 5.11 MIL/uL   Hemoglobin 11.8 (L) 12.0 - 15.0 g/dL   HCT 30.8 65.7 - 84.6 %   MCV 88.9 80.0 - 100.0 fL   MCH 29.0 26.0 - 34.0 pg   MCHC 32.6 30.0 - 36.0 g/dL   RDW 96.2 95.2 - 84.1 %   Platelets 212 150 - 400 K/uL   nRBC 0.0 0.0 - 0.2 %    Comment: Performed at [facility], [address], [place], [place] [address]  Troponin I (High Sensitivity)     Status: None   Collection Time: 10/11/22  2:31 PM  Result Value Ref Range   Troponin I (High Sensitivity) 14 <18 ng/L    Comment: (NOTE) Elevated high sensitivity troponin I (hsTnI) values and significant  changes across serial measurements may suggest ACS but many other  chronic and acute conditions are known to elevate hsTnI results.  Refer to the "Links" section for chest pain algorithms and additional  guidance. Performed at [facility], [address], [place], [place] [address]   Brain natriuretic peptide     Status: Abnormal   Collection Time: 10/11/22  2:31 PM  Result Value Ref Range   B Natriuretic Peptide 403.8 (H) 0.0 - 100.0 pg/mL    Comment: Performed at [facility], [address] [place], [place] [address]  Hepatic function panel     Status: Abnormal   Collection Time: 10/11/22  2:31 PM  Result Value Ref Range   Total Protein 8.1 6.5 - 8.1 g/dL   Albumin 2.9 (L) 3.5 - 5.0 g/dL   AST 25 15 - 41 U/L   ALT 11 0 - 44 U/L   Alkaline Phosphatase 60 38 - 126 U/L   Total Bilirubin 1.0 0.3 - 1.2 mg/dL   Bilirubin, Direct 0.3 (H) 0.0 - 0.2 mg/dL   Indirect Bilirubin 0.7 0.3 - 0.9 mg/dL    Comment: Performed at [facility], [address] [place], [place] [address]  Magnesium     Status: None   Collection Time: 10/11/22  2:31 PM   Result Value Ref Range   Magnesium 1.7 1.7 - 2.4 mg/dL    Comment: Performed at [facility], [address] [place], [place] [address]  Resp panel by RT-PCR (RSV, Flu A&B, Covid) Anterior Nasal Swab     Status: None   Collection Time: 10/11/22  2:36  PM   Specimen: Anterior Nasal Swab  Result Value Ref Range   SARS Coronavirus 2 by RT PCR NEGATIVE NEGATIVE   Influenza A by PCR NEGATIVE NEGATIVE   Influenza B by PCR NEGATIVE NEGATIVE    Comment: (NOTE) The Xpert Xpress SARS-CoV-2/FLU/RSV plus assay is intended as an aid in the diagnosis of influenza from Nasopharyngeal swab specimens and should not be used as a sole basis for treatment. Nasal washings and aspirates are unacceptable for Xpert Xpress SARS-CoV-2/FLU/RSV testing.  Fact Sheet for Patients: BloggerCourse.com  Fact Sheet for Healthcare Providers: SeriousBroker.it  This test is not yet approved or cleared by the [place] FDA and has been authorized for detection and/or diagnosis of SARS-CoV-2 by FDA under an Emergency Use Authorization (EUA). This EUA will remain in effect (meaning this test can be used) for the duration of the COVID-19 declaration under Section 564(b)(1) of the Act, 21 U.S.C. section 360bbb-3(b)(1), unless the authorization is terminated or revoked.     Resp Syncytial Virus by PCR NEGATIVE NEGATIVE    Comment: (NOTE) Fact Sheet for Patients: BloggerCourse.com  Fact Sheet for Healthcare Providers: SeriousBroker.it  This test is not yet approved or cleared by the [place] FDA and has been authorized for detection and/or diagnosis of SARS-CoV-2 by FDA under an Emergency Use Authorization (EUA). This EUA will remain in effect (meaning this test can be used) for the duration of the COVID-19 declaration under Section 564(b)(1) of the Act, 21 U.S.C. section 360bbb-3(b)(1), unless the  authorization is terminated or revoked.  Performed at [facility], [address] [place], [place] [address]   I-Stat CG4 Lactic Acid     Status: Abnormal   Collection Time: 10/11/22  2:50 PM  Result Value Ref Range   Lactic Acid, Venous 2.5 (HH) 0.5 - 1.9 mmol/L   Comment NOTIFIED PHYSICIAN   I-Stat CG4 Lactic Acid     Status: None   Collection Time: 10/11/22  4:25 PM  Result Value Ref Range   Lactic Acid, Venous 1.7 0.5 - 1.9 mmol/L   DG Chest Portable 1 View  Result Date: 10/11/2022 CLINICAL DATA:  Short of breath. EXAM: PORTABLE CHEST 1 VIEW COMPARISON:  06/01/2022 and older exams.  CT, 06/26/2022. FINDINGS: Mild enlargement of cardiac silhouette. No mediastinal or hilar masses. Coarse interstitial thickening in the lower lungs and inferior aspect of the right upper lobe with associated ground-glass type opacity. Lungs hyperexpanded but otherwise clear. No convincing pleural effusion.  No pneumothorax. Skeletal structures grossly intact. IMPRESSION: 1. 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 Cannot exclude superimposed infection. No evidence of pulmonary edema. 2. Mild stable cardiomegaly. Electronically Signed   By: Amie Portland M.D.   On: 10/11/2022 15:00    Pending Labs Unresulted Labs (From admission, onward)     Start     Ordered   10/12/22 0500  Heparin level (unfractionated)  Daily,   R      10/11/22 1547   10/12/22 0000  Heparin level (unfractionated)  Once-Timed,   URGENT        10/11/22 1547   10/11/22 1627  SARS Coronavirus 2 by RT PCR (hospital order, performed in [facility] hospital lab) *cepheid single result test* Anterior Nasal Swab  (Tier 2 - SARS Coronavirus 2 by RT PCR (hospital order, performed in [facility] hospital lab) *cepheid single result test*)  Once,   R       Comments: Recent sick contacts    10/11/22 1627  10/11/22 1621  Urinalysis, Routine w reflex microscopic -Urine, Clean Catch  Once,   R        Question:  Specimen Source  Answer:  Urine, Clean Catch   10/11/22 1620   10/11/22 1555  Legionella Pneumophila Serogp 1 Ur Ag  Once,   URGENT        10/11/22 1554   10/11/22 1549  TSH  Once,   URGENT        10/11/22 1548   10/11/22 1436  Urinalysis, w/ Reflex to Culture (Infection Suspected) -Urine, Clean Catch  (Undifferentiated presentation (screening labs and basic nursing orders))  ONCE - URGENT,   URGENT       Question:  Specimen Source  Answer:  Urine, Clean Catch   10/11/22 1435   10/11/22 1435  Blood culture (routine x 2)  BLOOD CULTURE X 2,   R (with STAT occurrences)      10/11/22 1434            Vitals/Pain Today's Vitals   10/11/22 1515 10/11/22 1530 10/11/22 1545 10/11/22 1600  BP: (!) 152/86 134/82 126/83 118/77  Pulse: (!) 136 (!) 137 (!) 140 (!) 139  Resp: (!) 28 (!) 25 (!) 34 16  Temp:      SpO2: 99% 98% 100% 99%  Weight:      Height:      PainSc:        Isolation Precautions Airborne and Contact precautions  Medications Medications  azithromycin (ZITHROMAX) 500 mg in sodium chloride 0.9 % 250 mL IVPB (500 mg Intravenous New Bag/Given 10/11/22 1535)  diltiazem (CARDIZEM) 125 mg in dextrose 5% 125 mL (1 mg/mL) infusion (5 mg/hr Intravenous New Bag/Given 10/11/22 1556)  heparin ADULT infusion 100 units/mL (25000 units/265mL) (1,000 Units/hr Intravenous New Bag/Given 10/11/22 1626)  lactated ringers bolus 500 mL (0 mLs Intravenous Stopped 10/11/22 1529)  acetaminophen (TYLENOL) tablet 650 mg (650 mg Oral Given 10/11/22 1504)  cefTRIAXone (ROCEPHIN) 1 g in sodium chloride 0.9 % 100 mL IVPB (0 g Intravenous Stopped 10/11/22 1535)  sodium chloride 0.9 % bolus 1,000 mL (1,000 mLs Intravenous New Bag/Given 10/11/22 1603)  methylPREDNISolone sodium succinate (SOLU-MEDROL) 125 mg/2 mL injection 125 mg (125 mg Intravenous Given 10/11/22 1622)    Mobility walks with device - cane      Focused Assessments     R Recommendations: See Admitting Provider  Note  Report given to:   Additional Notes:

## 2022-10-11 NOTE — ED Triage Notes (Signed)
PT arrives via EMS from home. Pt reports she has been feeling weak and fatigued for the past few days and is having sob with exertion. Upon EMS arrival, her HR was 180. EMS administered 10mg  of diltiazem and a ns bolus. PT is AxOx4. HR is currently in the 130s. PT denies cp at this time

## 2022-10-11 NOTE — ED Notes (Signed)
Called the lab to add onto urine sample

## 2022-10-11 NOTE — Consult Note (Signed)
CARDIOLOGY CONSULT NOTE  Patient ID: [name] MRN: [id number] DOB/AGE: Jul 18, 1940 82 y.o.  Admit date: 10/11/2022 Attending physician: Clydie Braun, MD Primary Physician:  Merri Brunette, MD Outpatient Cardiology Provider: Dr. Yates Decamp Inpatient Cardiologist: Tessa Lerner, DO, [facility]  Reason of consultation: Atrial flutter, symptomatic Referring physician: Clydie Braun, MD  Chief complaint: Shortness of breath, fevers  HPI:  [name] is a 82 y.o. African-American female who presents with a chief complaint of " shortness of breath, fever." Her past medical history and cardiovascular risk factors include: Hypertension, hyperlipidemia, diabetes mellitus type 2, chronic kidney disease, coronary calcification by nongated CT study, lupus on Plaquenil, history of breast cancer status post right breast lumpectomy, history of recurrent urinary tract infections, ILD.  Patient is accompanied by her daughter and extended family at bedside.  She provides verbal consent with regards to discussing her medical information in their presence.  Daughter also provides collateral history.  She has been experiencing shortness of breath which has been ongoing for the last several weeks.  Patient states" I think I have a virus."  Shortness of breath has been getting progressively worse.  Last Sunday she had gone to urgent care and was tested negative for COVID-19 infection.  But continued to have sporadic episodes of fevers, chills, shaking episodes.  Her last episode of fever was yesterday 10/10/2022.  Since the patient was not at baseline daughter called rescue squad who checked her heart rate was noted to be tachycardic.  EKG confirmed atrial fibrillation and she was given medications through her IV as well as fluids and was brought to the ED for further evaluation and management.  Cardiology was asked to evaluate the patient for new onset of atrial flutter.  She has been started on IV heparin drip  and diltiazem drip.  Her ventricular rate is slowly improving.   ALLERGIES: Allergies  Allergen Reactions   Ciprofloxacin Itching, Swelling and Other (See Comments)    Possibly causing tremors?   Lisinopril Cough   Crestor [Rosuvastatin] Other (See Comments)    Myalgia and back pain   Ofev [Nintedanib] Nausea Only    PAST MEDICAL HISTORY: Past Medical History:  Diagnosis Date   Acute bronchitis 12/17/2021   Acute cystitis 02/20/2019   Breast cancer (HCC)    Diabetes mellitus without complication (HCC)    Hypertension    Hypothyroidism    Lupus (HCC)    Pneumonia 07/31/2021    PAST SURGICAL HISTORY: Past Surgical History:  Procedure Laterality Date   BREAST LUMPECTOMY  2009   IR 3D INDEPENDENT WKST  05/29/2021   IR ANGIO INTRA EXTRACRAN SEL COM CAROTID INNOMINATE BILAT MOD SED  05/29/2021   IR ANGIO VERTEBRAL SEL VERTEBRAL BILAT MOD SED  05/29/2021    FAMILY HISTORY: The patient's family history includes Asthma in her brother; Cancer in her father.   SOCIAL HISTORY:  The patient  reports that she has never smoked. She quit smokeless tobacco use about 7 years ago.  Her smokeless tobacco use included snuff. She reports that she does not drink alcohol and does not use drugs.  MEDICATIONS: Current Outpatient Medications  Medication Instructions   albuterol (VENTOLIN HFA) 108 (90 Base) MCG/ACT inhaler 2 puffs, Inhalation, Every 4 hours PRN   amLODipine (NORVASC) 5 mg, Oral, Every evening   benzonatate (TESSALON) 100 mg, Oral, Every 8 hours PRN   bisoprolol (ZEBETA) 5 mg, Oral, Daily   diclofenac Sodium (VOLTAREN) 1 % GEL 1 application , Topical, 4 times  daily PRN   fluticasone (FLONASE) 50 MCG/ACT nasal spray 1 spray, Each Nare, Daily   gabapentin (NEURONTIN) 300 mg, Oral, Every 8 hours   glimepiride (AMARYL) 1 mg, Oral, Daily with breakfast   hydroxychloroquine (PLAQUENIL) 200 mg, Oral, Daily   ibuprofen (ADVIL) 200 mg, Oral, Every 6 hours PRN   ipratropium-albuterol  (DUONEB) 0.5-2.5 (3) MG/3ML SOLN 3 mLs, Nebulization, Every 6 hours PRN   levothyroxine (SYNTHROID) 100 mcg, Daily   omeprazole (PRILOSEC) 20 mg, Oral, Daily before breakfast   spironolactone (ALDACTONE) 12.5 mg, Oral, BH-each morning    REVIEW OF SYSTEMS: Review of Systems  Constitutional: Positive for chills, fever and malaise/fatigue.  Cardiovascular:  Positive for dyspnea on exertion. Negative for chest pain, claudication, irregular heartbeat, leg swelling, near-syncope, orthopnea, palpitations, paroxysmal nocturnal dyspnea and syncope.  Respiratory:  Positive for cough, shortness of breath and sputum production.   Hematologic/Lymphatic: Negative for bleeding problem.  Musculoskeletal:  Negative for muscle cramps and myalgias.  Gastrointestinal:  Positive for diarrhea.  Neurological:  Negative for dizziness and light-headedness.    PHYSICAL EXAMINATION: PHYSICAL EXAM: Temp:  [98.5 F (36.9 C)-100.1 F (37.8 C)] 98.5 F (36.9 C) (09/15 1700) Pulse Rate:  [88-140] 88 (09/15 1700) Resp:  [16-34] 17 (09/15 1700) BP: (108-152)/(46-86) 129/70 (09/15 1700) SpO2:  [97 %-100 %] 98 % (09/15 1700) Weight:  [63.5 kg] 63.5 kg (09/15 1431)  Intake/Output: No intake or output data in the 24 hours ending 10/11/22 1916   Net IO Since Admission: No IO data has been entered for this period [10/11/22 1916]  Weights:     10/11/2022    2:31 PM 10/04/2022    2:51 PM 09/08/2022   12:11 PM  Last 3 Weights  Weight (lbs) 140 lb 140 lb 148 lb  Weight (kg) 63.504 kg 63.504 kg 67.132 kg     Physical Exam  Constitutional: No distress. She appears chronically ill.  hemodynamically stable.   Neck: No JVD present.  Cardiovascular: S1 normal and S2 normal. An irregularly irregular rhythm present. Tachycardia present.  No murmurs rubs or gallops appreciated secondary to tachycardia.  Pulmonary/Chest: Effort normal. No stridor. She has no rales. She has diffuse wheezes.  Rhonchi's bilaterally   Abdominal: Soft. Bowel sounds are normal. She exhibits no distension. There is no abdominal tenderness.  Musculoskeletal:        General: No edema.     Cervical back: Neck supple.  Neurological: She is alert and oriented to person, place, and time. She has intact cranial nerves (2-12).  Skin: Skin is warm and moist.    LAB RESULTS: Chemistry Recent Labs  Lab 10/11/22 1431  NA 126*  K 3.8  CL 97*  CO2 19*  GLUCOSE 117*  BUN 33*  CREATININE 1.95*  CALCIUM 8.5*  PROT 8.1  ALBUMIN 2.9*  AST 25  ALT 11  ALKPHOS 60  BILITOT 1.0  GFRNONAA 26*  ANIONGAP 10    Hematology Recent Labs  Lab 10/11/22 1431  WBC 16.5*  RBC 4.07  HGB 11.8*  HCT 36.2  MCV 88.9  MCH 29.0  MCHC 32.6  RDW 13.7  PLT 212   High Sensitivity Troponin:   Recent Labs  Lab 10/11/22 1431 10/11/22 1608  TROPONINIHS 14 15     Cardiac EnzymesNo results for input(s): "TROPONINI" in the last 168 hours. No results for input(s): "TROPIPOC" in the last 168 hours.  BNP Recent Labs  Lab 10/11/22 1431  BNP 403.8*    DDimer  Recent Labs  Lab  10/11/22 1431  DDIMER 2.83*    Hemoglobin A1c:  Lab Results  Component Value Date   HGBA1C 6.7 (H) 04/25/2022   MPG 146 04/25/2022   TSH  Recent Labs    04/25/22 1702 04/25/22 2211 10/11/22 1608  TSH 3.262 3.005 4.162   Lipid Panel  Lab Results  Component Value Date   CHOL 152 02/20/2019   HDL 37 (L) 02/20/2019   LDLCALC 88 02/20/2019   TRIG 134 02/20/2019   CHOLHDL 4.1 02/20/2019   Drugs of Abuse  No results found for: "LABOPIA", "COCAINSCRNUR", "LABBENZ", "AMPHETMU", "THCU", "LABBARB"    CARDIAC DATABASE: EKG: 10/11/2022: Atrial flutter 2:1 conduction, rare PVCs, left axis.  10/09/2022: Atrial flutter, 138 bpm, left axis.  Echocardiogram: 04/2022  1. Left ventricular ejection fraction, by estimation, is 60 to 65%. The  left ventricle has normal function. The left ventricle has no regional  wall motion abnormalities. There is mild  concentric left ventricular  hypertrophy. Left ventricular diastolic  parameters are consistent with Grade I diastolic dysfunction (impaired  relaxation). Elevated left ventricular end-diastolic pressure.   2. Right ventricular systolic function is normal. The right ventricular  size is normal.   3. There is severe calcification involving the anterior mitral valve  leaflet that extends into the chordae tendinae. The mitral valve is  degenerative. Trivial mitral valve regurgitation. No evidence of mitral  stenosis.   4. The aortic valve is normal in structure. Aortic valve regurgitation is  not visualized. No aortic stenosis is present.   5. 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   Stress Testing:  Lexiscan myoview stress test 07/03/2016: 1. The resting electrocardiogram demonstrated normal sinus rhythm, normal resting conduction, PVC and normal rest repolarization. Stress EKG is non-diagnostic for ischemia as it a pharmacologic stress using Lexiscan. Stress symptoms included dyspnea. 2. Myocardial perfusion imaging is normal. Overall left ventricular systolic function was normal without regional wall motion abnormalities. The left ventricular ejection fraction was 75%.  Scheduled Meds:  acidophilus  1 capsule Oral BID   guaiFENesin  600 mg Oral BID   insulin aspart  0-5 Units Subcutaneous QHS   [START ON 10/12/2022] insulin aspart  0-9 Units Subcutaneous TID WC   sodium chloride flush  3 mL Intravenous Q12H    Continuous Infusions:  sodium chloride     [START ON 10/12/2022] cefTRIAXone (ROCEPHIN)  IV     diltiazem (CARDIZEM) infusion 5 mg/hr (10/11/22 1556)   [START ON 10/12/2022] doxycycline (VIBRAMYCIN) IV     heparin 1,000 Units/hr (10/11/22 1626)    PRN Meds: ipratropium-albuterol  IMPRESSION & RECOMMENDATIONS: [name] is a 82 y.o. African-American female whose past medical history and cardiovascular risk factors include: Aortic atherosclerosis, hypertension,  hyperlipidemia, diabetes mellitus type 2, chronic kidney disease, coronary calcification by nongated CT study, lupus on Plaquenil, history of breast cancer status post right breast lumpectomy, history of recurrent urinary tract infections, ILD.  Impression:  Atrial flutter with rapid ventricular rate. Acute febrile illness. Shortness of breath-multifactorial infectious etiology cannot be ruled out in the acute setting but also has underlying ILD. Hypertension. Hyperlipidemia. Diabetes mellitus type 2. ILD. Lupus History of breast cancer. History of urinary tract infections. Aortic atherosclerosis   Plan:  Symptomatic atrial flutter with rapid ventricular rate: Newly discovered-has had intermittent episodes in the past. Current onset unknown Rate control: Diltiazem drip. Rhythm control: N/A. Thromboembolic prophylaxis: IV heparin drip Click Here to Calculate/Change CHADS2VASc Score The patient's CHADS2-VASc score is 6, indicating a 9.7% annual risk of stroke.  CHF History: No HTN History: Yes Diabetes History: Yes Stroke History: No Vascular Disease History: Yes Recommend oral anticoagulation for now to reduce the chances of thromboembolic events.   No history of falls or bleeding per patient and daughter.   They understand the risks, benefits, and alternatives to anticoagulation. She has had remote history of intermittent atrial fibrillation/flutter upon review of EMR but not placed on anticoagulation as per shared decision. Her onset of current AFL is unknown, no prior fall/GI bleeding, lives w/ her daughter, and high CHA2DS2-VASc SCORE thus I would favor anticoagulation to reduce the risk of thromboembolic events. Long-term use of anticoagulation can be discussed as outpatient with primary cardiology reassessing risks versus benefits.  Patient and family agreeable with the plan of care. Of note, patient had a cardiac monitor in the past which did not illustrate atrial  fibrillation/flutter during the monitoring period.  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  In addition, would avoid the use of amiodarone if possible given her history of ILD.  If she does not convert to sinus rhythm with rate control strategy could consider sotalol as an alternative.  Acute febrile illness: Management per primary team.  Shortness of breath: Multifactorial.  Will focus on management of atrial flutter.  Management of the noncardiac causes deferred to attending physician.  Hypertension: Resume home medications.  Hyperlipidemia: Resume home medications.  Plan of care discussed with the patient, daughter, and family at bedside.  Their questions and concerns addressed to their satisfaction.   Total encounter time 66 minutes. *Total Encounter Time as defined by the Centers for Medicare and Medicaid Services includes, in addition to the face-to-face time of a patient visit (documented in the note above) non-face-to-face time: obtaining and reviewing outside history, ordering and reviewing medications, tests or procedures, care coordination (communications with other health care professionals or caregivers) and documentation in the medical record.  Patient's questions and concerns were addressed to her satisfaction. She voices understanding of the instructions provided during this encounter.   This note was created using a voice recognition software as a result there may be grammatical errors inadvertently enclosed that do not reflect the nature of this encounter. Every attempt is made to correct such errors.  Delilah Shan [facility]  Pager:  [phone] Office: [phone] 10/11/2022, 7:16 PM

## 2022-10-12 ENCOUNTER — Inpatient Hospital Stay (HOSPITAL_COMMUNITY): Payer: 59

## 2022-10-12 ENCOUNTER — Other Ambulatory Visit (HOSPITAL_COMMUNITY): Payer: Self-pay

## 2022-10-12 DIAGNOSIS — J189 Pneumonia, unspecified organism: Secondary | ICD-10-CM

## 2022-10-12 DIAGNOSIS — A419 Sepsis, unspecified organism: Secondary | ICD-10-CM | POA: Diagnosis not present

## 2022-10-12 DIAGNOSIS — I4892 Unspecified atrial flutter: Secondary | ICD-10-CM

## 2022-10-12 LAB — BLOOD CULTURE ID PANEL (REFLEXED) - BCID2

## 2022-10-12 LAB — ECHOCARDIOGRAM COMPLETE
Area-P 1/2: 3.72 cm2
Height: 67 in
S' Lateral: 2.1 cm
Weight: 2363.33 [oz_av]

## 2022-10-12 LAB — GLUCOSE, CAPILLARY
Glucose-Capillary: 167 mg/dL — ABNORMAL HIGH (ref 70–99)
Glucose-Capillary: 135 mg/dL — ABNORMAL HIGH (ref 70–99)
Glucose-Capillary: 152 mg/dL — ABNORMAL HIGH (ref 70–99)
Glucose-Capillary: 136 mg/dL — ABNORMAL HIGH (ref 70–99)

## 2022-10-12 LAB — HEPARIN LEVEL (UNFRACTIONATED)
Heparin Unfractionated: 0.23 [IU]/mL — ABNORMAL LOW (ref 0.30–0.70)
Heparin Unfractionated: 0.27 [IU]/mL — ABNORMAL LOW (ref 0.30–0.70)
Heparin Unfractionated: 0.37 [IU]/mL (ref 0.30–0.70)

## 2022-10-12 LAB — BASIC METABOLIC PANEL
Anion gap: 7 (ref 5–15)
BUN: 23 mg/dL (ref 8–23)
CO2: 18 mmol/L — ABNORMAL LOW (ref 22–32)
Calcium: 8.3 mg/dL — ABNORMAL LOW (ref 8.9–10.3)
Chloride: 105 mmol/L (ref 98–111)
Creatinine, Ser: 1.19 mg/dL — ABNORMAL HIGH (ref 0.44–1.00)
GFR, Estimated: 46 mL/min — ABNORMAL LOW (ref >60)
Glucose, Bld: 163 mg/dL — ABNORMAL HIGH (ref 70–99)
Potassium: 4.1 mmol/L (ref 3.5–5.1)
Sodium: 130 mmol/L — ABNORMAL LOW (ref 135–145)

## 2022-10-12 LAB — MAGNESIUM: Magnesium: 2.2 mg/dL (ref 1.7–2.4)

## 2022-10-12 MED ORDER — GUAIFENESIN-DM 100-10 MG/5ML PO SYRP
5.0000 mL | ORAL_SOLUTION | ORAL | Status: DC | PRN
  Administered 2022-10-12 – 2022-10-13 (×3): 5 mL via ORAL
  Filled 2022-10-12 (×4): qty 5

## 2022-10-12 MED ORDER — DM-GUAIFENESIN ER 30-600 MG PO TB12
1.0000 | ORAL_TABLET | Freq: Two times a day (BID) | ORAL | Status: DC
  Administered 2022-10-12 – 2022-10-16 (×8): 1 via ORAL
  Filled 2022-10-12 (×8): qty 1

## 2022-10-12 MED ORDER — IOHEXOL 350 MG/ML SOLN
75.0000 mL | Freq: Once | INTRAVENOUS | Status: AC | PRN
  Administered 2022-10-12: 75 mL via INTRAVENOUS

## 2022-10-12 MED ORDER — PANTOPRAZOLE SODIUM 40 MG PO TBEC
40.0000 mg | DELAYED_RELEASE_TABLET | Freq: Every day | ORAL | Status: DC
  Administered 2022-10-12 – 2022-10-16 (×5): 40 mg via ORAL
  Filled 2022-10-12 (×5): qty 1

## 2022-10-12 MED ORDER — IPRATROPIUM-ALBUTEROL 0.5-2.5 (3) MG/3ML IN SOLN
3.0000 mL | Freq: Four times a day (QID) | RESPIRATORY_TRACT | Status: DC
  Administered 2022-10-12 – 2022-10-13 (×2): 3 mL via RESPIRATORY_TRACT
  Filled 2022-10-12 (×2): qty 3

## 2022-10-12 NOTE — TOC Benefit Eligibility Note (Signed)
Patient Product/process development scientist completed.    The patient is insured through [facility]. Patient has Medicare and is not eligible for a copay card, but may be able to apply for patient assistance, if available.    Ran test claim for Eliquis 5 mg and the current 30 day co-pay is $0.00.  Ran test claim for Xarelto 20 mg and the current 30 day co-pay is $0.00.  This test claim was processed through [facility]- copay amounts may vary at other pharmacies due to pharmacy/plan contracts, or as the patient moves through the different stages of their insurance plan.     Roland Earl, CPHT Pharmacy Technician III Certified Patient Advocate [facility] Patient Advocate Team Direct Number: [phone]  Fax: [phone]

## 2022-10-12 NOTE — Progress Notes (Signed)
ANTICOAGULATION CONSULT NOTE - Follow Up  Pharmacy Consult for Heparin Indication: atrial fibrillation  Allergies  Allergen Reactions   Ciprofloxacin Itching, Swelling and Other (See Comments)    Possibly causing tremors?   Lisinopril Cough   Crestor [Rosuvastatin] Other (See Comments)    Myalgia and back pain   Ofev [Nintedanib] Nausea Only    Patient Measurements: Height: 5\' 7"  (170.2 cm) Weight: 67 kg (147 lb 11.3 oz) IBW/kg (Calculated) : 61.6 Heparin Dosing Weight: 63.5 kg  Vital Signs: Temp: 97.6 F (36.4 C) (09/16 2000) Temp Source: Oral (09/16 2000) BP: 143/73 (09/16 2000) Pulse Rate: 71 (09/16 2000)  Labs: Recent Labs    10/11/22 1431 10/11/22 1608 10/12/22 0021 10/12/22 0215 10/12/22 1018 10/12/22 2115  HGB 11.8*  --   --   --   --   --   HCT 36.2  --   --   --   --   --   PLT 212  --   --   --   --   --   HEPARINUNFRC  --   --  0.23*  --  0.27* 0.37  CREATININE 1.95*  --   --  1.19*  --   --   TROPONINIHS 14 15  --   --   --   --     Estimated Creatinine Clearance: 36.7 mL/min (A) (by C-G formula based on SCr of 1.19 mg/dL (H)).   Medical History: Past Medical History:  Diagnosis Date   Acute bronchitis 12/17/2021   Acute cystitis 02/20/2019   Breast cancer (HCC)    Diabetes mellitus without complication (HCC)    Hypertension    Hypothyroidism    Lupus (HCC)    Pneumonia 07/31/2021    Assessment: 82 yoF admitted with AFL. Pharmacy to dose heparin. Pt is not on AC PTA.   Heparin level therapeutic at 0.37, CBC stable, no infusion issues or bleeding documented.   Goal of Therapy:  Heparin level 0.3-0.7 units/ml Monitor platelets by anticoagulation protocol: Yes   Plan:  Continue heparin infusion at 1250 units/h Check confirmatory HL with AM labs Follow up long-term [facility] plans  Loralee Pacas, PharmD, BCPS 10/12/2022 10:27 PM  Please check AMION for all [facility] Pharmacy phone numbers After 10:00 PM, call Main Pharmacy [phone]

## 2022-10-12 NOTE — Progress Notes (Signed)
Echocardiogram 2D Echocardiogram has been performed.  Warren Lacy Oley Lahaie RDCS 10/12/2022, 11:17 AM

## 2022-10-12 NOTE — Progress Notes (Signed)
ANTICOAGULATION CONSULT NOTE - Follow Up  Pharmacy Consult for Heparin Indication: atrial fibrillation  Allergies  Allergen Reactions   Ciprofloxacin Itching, Swelling and Other (See Comments)    Possibly causing tremors?   Lisinopril Cough   Crestor [Rosuvastatin] Other (See Comments)    Myalgia and back pain   Ofev [Nintedanib] Nausea Only    Patient Measurements: Height: 5\' 7"  (170.2 cm) Weight: 63.5 kg (140 lb) IBW/kg (Calculated) : 61.6 Heparin Dosing Weight: 63.5 kg  Vital Signs: Temp: 98.5 F (36.9 C) (09/15 2337) Temp Source: Oral (09/15 2337) BP: 138/83 (09/15 2337) Pulse Rate: 80 (09/15 2337)  Labs: Recent Labs    10/11/22 1431 10/11/22 1608 10/12/22 0021  HGB 11.8*  --   --   HCT 36.2  --   --   PLT 212  --   --   HEPARINUNFRC  --   --  0.23*  CREATININE 1.95*  --   --   TROPONINIHS 14 15  --     Estimated Creatinine Clearance: 22.4 mL/min (A) (by C-G formula based on SCr of 1.95 mg/dL (H)).   Medical History: Past Medical History:  Diagnosis Date   Acute bronchitis 12/17/2021   Acute cystitis 02/20/2019   Breast cancer (HCC)    Diabetes mellitus without complication (HCC)    Hypertension    Hypothyroidism    Lupus (HCC)    Pneumonia 07/31/2021    Assessment: 82 yof with a history of SOB. Patient is presenting with hypothyroidism, HTN, SLE, asthma, ILD, DM. Heparin per pharmacy consult placed for atrial fibrillation.  Patient is not on anticoagulation prior to arrival.  Hgb 11.8; plt 212  9/16 AM: heparin level returned at 0.23 on 1000 units/hr (subtherapeutic). Per RN, no issues with the heparin infusion running or signs/symptoms of bleeding. Last CBC showed Hgb 11.8, plts WNL  Goal of Therapy:  Heparin level 0.3-0.7 units/ml Monitor platelets by anticoagulation protocol: Yes   Plan:  Increase heparin infusion to 1100 units/hr Check anti-Xa level in 8 hours and daily while on heparin Continue to monitor H&H and platelets  Arabella Merles, PharmD. Clinical Pharmacist 10/12/2022 12:52 AM

## 2022-10-12 NOTE — Progress Notes (Signed)
PHARMACY - PHYSICIAN COMMUNICATION CRITICAL VALUE ALERT - BLOOD CULTURE IDENTIFICATION (BCID)  [name] is an 82 y.o. female who presented to [facility] on 10/11/2022 with a chief complaint of sepsis due to CAP.  Assessment: Strep pneumoniae bloodstream infection. 1 of 2 sets. No resistance. Likely due to CAP  Name of physician (or Provider) Contacted: Andreas Newport, MD  Current antibiotics: Ceftriaxone and Doxycyline  Changes to prescribed antibiotics recommended:  Provider notified. Current antibiotics appropriately cover S. pnuemoniae BSI. Recommended discontinuation of doxycycline and continue ceftriaxone. Awaiting provider response.   Results for orders placed or performed during the hospital encounter of 10/11/22  Blood Culture ID Panel (Reflexed) (Collected: 10/11/2022  2:35 PM)  Result Value Ref Range   Enterococcus faecalis NOT DETECTED NOT DETECTED   Enterococcus Faecium NOT DETECTED NOT DETECTED   Listeria monocytogenes NOT DETECTED NOT DETECTED   Staphylococcus species NOT DETECTED NOT DETECTED   Staphylococcus aureus (BCID) NOT DETECTED NOT DETECTED   Staphylococcus epidermidis NOT DETECTED NOT DETECTED   Staphylococcus lugdunensis NOT DETECTED NOT DETECTED   Streptococcus species DETECTED (A) NOT DETECTED   Streptococcus agalactiae NOT DETECTED NOT DETECTED   Streptococcus pneumoniae DETECTED (A) NOT DETECTED   Streptococcus pyogenes NOT DETECTED NOT DETECTED   A.calcoaceticus-baumannii NOT DETECTED NOT DETECTED   Bacteroides fragilis NOT DETECTED NOT DETECTED   Enterobacterales NOT DETECTED NOT DETECTED   Enterobacter cloacae complex NOT DETECTED NOT DETECTED   Escherichia coli NOT DETECTED NOT DETECTED   Klebsiella aerogenes NOT DETECTED NOT DETECTED   Klebsiella oxytoca NOT DETECTED NOT DETECTED   Klebsiella pneumoniae NOT DETECTED NOT DETECTED   Proteus species NOT DETECTED NOT DETECTED   Salmonella species NOT DETECTED NOT DETECTED   Serratia  marcescens NOT DETECTED NOT DETECTED   Haemophilus influenzae NOT DETECTED NOT DETECTED   Neisseria meningitidis NOT DETECTED NOT DETECTED   Pseudomonas aeruginosa NOT DETECTED NOT DETECTED   Stenotrophomonas maltophilia NOT DETECTED NOT DETECTED   Candida albicans NOT DETECTED NOT DETECTED   Candida auris NOT DETECTED NOT DETECTED   Candida glabrata NOT DETECTED NOT DETECTED   Candida krusei NOT DETECTED NOT DETECTED   Candida parapsilosis NOT DETECTED NOT DETECTED   Candida tropicalis NOT DETECTED NOT DETECTED   Cryptococcus neoformans/gattii NOT DETECTED NOT DETECTED    [name] 10/12/2022  10:51 AM

## 2022-10-12 NOTE — Progress Notes (Addendum)
PROGRESS NOTE  [name] WJX:[id number] DOB: 10/20/1940 DOA: 10/11/2022 PCP: Merri Brunette, MD  HPI/Recap of past 24 hours: [name] is a 82 y.o. female with medical history significant of HTN, CAD, atrial flutter, interstitial lung disease, DM type 2, CKD stage III, SLE, hypothyroidism, and remote history of breast cancer s/p lumpectomy who presents with complaints of cough, SOB, fever X 1 week. En route with EMS, patient was noted to have heart rates into the 180s.  She was given 10 mg of diltiazem IV and 500 mL bolus of normal saline IV fluids. In the ED, HR elevated into the 140s, respirations 16-34, O2 saturation maintained on room air.  Labs significant for WBC 16.5, BNP 403.8, high-sensitivity troponin 14, and lactic acid 2.5.  Chest x-ray noted findings consistent with interstitial disease with no significant evidence of edema or focal infiltrate.  Cardiology consulted.  Patient admitted for further management.     Today, patient continues to have productive cough, and pain around her chest likely due to coughing.  Patient denies any left-sided chest pain, worsening shortness of breath, fever or chills.      Assessment/Plan: Principal Problem:   Sepsis due to pneumonia [facility]) Active Problems:   UTI (urinary tract infection)   Paroxysmal atrial flutter (HCC)   Hyponatremia   AKI (acute kidney injury) (HCC)   Diarrhea   Diabetes mellitus type 2, noninsulin dependent (HCC)   Chronic diastolic CHF (congestive heart failure) (HCC)   Hypothyroidism   History of systemic lupus erythematosus (SLE) (HCC)   ILD (interstitial lung disease) (HCC)   Controlled type 2 diabetes mellitus without complication, without long-term current use of insulin (HCC)   Mixed hyperlipidemia   Benign hypertension   Acute febrile illness    Severe sepsis likely 2/2:  CAP 2/2 strep pneumoniae + possible UTI + bacteremia On admission, tachycardic, tachypneic, leukocytosis, elevated  LA Currently afebrile, with leukocytosis BC x 2, with 1 set growing strep pneumoniae Procalcitonin 7.05 Urine strep pneumoniae positive, urine legionella pending UA with small leukocytes, positive nitrite, WBC 21-50, bacteria rare Flu and covid negative LA resolved Continue gentle IVF Continue empiric antibiotic Rocephin and Doxycycline for now, de-escalate once improving Monitor closely  Strep pneumo bacteremia BC x 2, with 1 set growing strep pneumoniae Management as above   UTI Urinalysis was small leukocytosis, positive leukocytosis, rare bacteria, and wbc 21-5 UC pending AB as above   Paroxysmal atrial flutter ?Hx of atrial flutter back in 2021 when patient was admitted with syncope CHA2DS2-VASc score = at 5 Continue Cardizem drip Continue heparin per pharmacy Cardiology consulted  Elevated d-dimer Likely in the setting of hx of lupus, sepsis CTA chest pending On heparin drip for Aflutter   Acute kidney injury Patient presents with creatinine elevated at 1.95 with BUN 35 Improving, continue IVF Daily BMP   Hyponatremia Acute on chronic, 126 on presentation Improving Continue IVF Daily BMP   Diarrhea None today Denies any abd pain   Chronic diastolic HF BNP elevated at 403 Last echocardiogram from 04/2022 noted EF to be 60-65 percent with grade 1 diastolic dysfunction Daily weights   Diabetes mellitus type 2, without long-term use of insulin Last hemoglobin A1c was 6.7 on 04/25/2022 SSI, accuchecks, hypoglycemic protocols   Interstitial lung disease Lupus/autoimmune disease Hold Plaquenil in the setting of sepsis Continue outpatient follow-up with pulmonology and rheumatology   Hypothyroidism Patient reported that she was no longer taking levothyroxine. TSH 4.1 Continue to monitor  Estimated body mass index is 23.13 kg/m as calculated from the following:   Height as of this encounter: 5\' 7"  (1.702 m).   Weight as of this encounter: 67 kg.      Code Status: Full  Family Communication: None at bedside  Disposition Plan: Status is: Inpatient Remains inpatient appropriate because: level of care      Consultants: Cardiology  Procedures: None  Antimicrobials: Ceftriaxone Doxycycline  DVT prophylaxis:  Heparin drip   Objective: Vitals:   10/12/22 0504 10/12/22 0756 10/12/22 1128 10/12/22 1528  BP:  132/71 133/69 136/78  Pulse:  73    Resp:  20    Temp:  97.6 F (36.4 C) 98.4 F (36.9 C) 99.2 F (37.3 C)  TempSrc:  Oral Oral Oral  SpO2:  99%    Weight: 67 kg     Height:        Intake/Output Summary (Last 24 hours) at 10/12/2022 1614 Last data filed at 10/12/2022 1448 Gross per 24 hour  Intake 2214.47 ml  Output --  Net 2214.47 ml   Filed Weights   10/11/22 1431 10/12/22 0504  Weight: 63.5 kg 67 kg    Exam: General: NAD, acutely ill appearing  Cardiovascular: S1, S2 present Respiratory: Rhonchi noted  Abdomen: Soft, nontender, nondistended, bowel sounds present Musculoskeletal: No bilateral pedal edema noted Skin: Normal Psychiatry: Normal mood     Data Reviewed: CBC: Recent Labs  Lab 10/11/22 1431  WBC 16.5*  HGB 11.8*  HCT 36.2  MCV 88.9  PLT 212   Basic Metabolic Panel: Recent Labs  Lab 10/11/22 1431 10/12/22 0215  NA 126* 130*  K 3.8 4.1  CL 97* 105  CO2 19* 18*  GLUCOSE 117* 163*  BUN 33* 23  CREATININE 1.95* 1.19*  CALCIUM 8.5* 8.3*  MG 1.7 2.2   GFR: Estimated Creatinine Clearance: 36.7 mL/min (A) (by C-G formula based on SCr of 1.19 mg/dL (H)). Liver Function Tests: Recent Labs  Lab 10/11/22 1431  AST 25  ALT 11  ALKPHOS 60  BILITOT 1.0  PROT 8.1  ALBUMIN 2.9*   No results for input(s): "LIPASE", "AMYLASE" in the last 168 hours. No results for input(s): "AMMONIA" in the last 168 hours. Coagulation Profile: No results for input(s): "INR", "PROTIME" in the last 168 hours. Cardiac Enzymes: No results for input(s): "CKTOTAL", "CKMB", "CKMBINDEX",  "TROPONINI" in the last 168 hours. BNP (last 3 results) Recent Labs    03/26/22 1525 06/01/22 1518  PROBNP 32.0 97.0   HbA1C: No results for input(s): "HGBA1C" in the last 72 hours. CBG: Recent Labs  Lab 10/11/22 2122 10/12/22 0624 10/12/22 1130 10/12/22 1530  GLUCAP 225* 167* 135* 152*   Lipid Profile: No results for input(s): "CHOL", "HDL", "LDLCALC", "TRIG", "CHOLHDL", "LDLDIRECT" in the last 72 hours. Thyroid Function Tests: Recent Labs    10/11/22 1608  TSH 4.162   Anemia Panel: No results for input(s): "VITAMINB12", "FOLATE", "FERRITIN", "TIBC", "IRON", "RETICCTPCT" in the last 72 hours. Urine analysis:    Component Value Date/Time   COLORURINE YELLOW 10/11/2022 1608   APPEARANCEUR CLEAR 10/11/2022 1608   LABSPEC 1.010 10/11/2022 1608   PHURINE 6.0 10/11/2022 1608   GLUCOSEU NEGATIVE 10/11/2022 1608   HGBUR SMALL (A) 10/11/2022 1608   BILIRUBINUR NEGATIVE 10/11/2022 1608   BILIRUBINUR negative 04/25/2022 1221   KETONESUR NEGATIVE 10/11/2022 1608   PROTEINUR NEGATIVE 10/11/2022 1608   UROBILINOGEN 1.0 04/25/2022 1221   UROBILINOGEN 0.2 06/20/2014 1842   NITRITE POSITIVE (A) 10/11/2022 1608  LEUKOCYTESUR SMALL (A) 10/11/2022 1608   Sepsis Labs: @LABRCNTIP (procalcitonin:4,lacticidven:4)  ) Recent Results (from the past 240 hour(s))  SARS CORONAVIRUS 2 (TAT 6-24 HRS) Anterior Nasal Swab     Status: None   Collection Time: 10/04/22  3:02 PM   Specimen: Anterior Nasal Swab  Result Value Ref Range Status   SARS Coronavirus 2 NEGATIVE NEGATIVE Final    Comment: (NOTE) SARS-CoV-2 target nucleic acids are NOT DETECTED.  The SARS-CoV-2 RNA is generally detectable in upper and lower respiratory specimens during the acute phase of infection. Negative results do not preclude SARS-CoV-2 infection, do not rule out co-infections with other pathogens, and should not be used as the sole basis for treatment or other patient management decisions. Negative results  must be combined with clinical observations, patient history, and epidemiological information. The expected result is Negative.  Fact Sheet for Patients: HairSlick.no  Fact Sheet for Healthcare Providers: quierodirigir.com  This test is not yet approved or cleared by the [place] FDA and  has been authorized for detection and/or diagnosis of SARS-CoV-2 by FDA under an Emergency Use Authorization (EUA). This EUA will remain  in effect (meaning this test can be used) for the duration of the COVID-19 declaration under Se ction 564(b)(1) of the Act, 21 U.S.C. section 360bbb-3(b)(1), unless the authorization is terminated or revoked sooner.  Performed at [facility], [address] [place], [place] [address]   Blood culture (routine x 2)     Status: None (Preliminary result)   Collection Time: 10/11/22  2:35 PM   Specimen: BLOOD RIGHT FOREARM  Result Value Ref Range Status   Specimen Description BLOOD RIGHT FOREARM  Final   Special Requests   Final    BOTTLES DRAWN AEROBIC AND ANAEROBIC Blood Culture results may not be optimal due to an excessive volume of blood received in culture bottles   Culture  Setup Time   Final    GRAM POSITIVE COCCI IN PAIRS AEROBIC BOTTLE ONLY CRITICAL RESULT CALLED TO, READ BACK BY AND VERIFIED WITH: PHARMD B AGEE [id number] AT 1021 AM BY CM Performed at [facility], [address] [place], [place] [address]    Culture GRAM POSITIVE COCCI  Final   Report Status PENDING  Incomplete  Blood Culture ID Panel (Reflexed)     Status: Abnormal   Collection Time: 10/11/22  2:35 PM  Result Value Ref Range Status   Enterococcus faecalis NOT DETECTED NOT DETECTED Final   Enterococcus Faecium NOT DETECTED NOT DETECTED Final   Listeria monocytogenes NOT DETECTED NOT DETECTED Final   Staphylococcus species NOT DETECTED NOT DETECTED Final   Staphylococcus aureus (BCID) NOT DETECTED NOT DETECTED  Final   Staphylococcus epidermidis NOT DETECTED NOT DETECTED Final   Staphylococcus lugdunensis NOT DETECTED NOT DETECTED Final   Streptococcus species DETECTED (A) NOT DETECTED Final    Comment: CRITICAL RESULT CALLED TO, READ BACK BY AND VERIFIED WITH: PHARMD B AGEE [id number] AR 1021 AM BY CM    Streptococcus agalactiae NOT DETECTED NOT DETECTED Final   Streptococcus pneumoniae DETECTED (A) NOT DETECTED Final    Comment: CRITICAL RESULT CALLED TO, READ BACK BY AND VERIFIED WITH: PHARMD B AGEE [id number] AT 1021 AM BY CM    Streptococcus pyogenes NOT DETECTED NOT DETECTED Final   A.calcoaceticus-baumannii NOT DETECTED NOT DETECTED Final   Bacteroides fragilis NOT DETECTED NOT DETECTED Final   Enterobacterales NOT DETECTED NOT DETECTED Final   Enterobacter cloacae complex NOT DETECTED NOT DETECTED Final  Escherichia coli NOT DETECTED NOT DETECTED Final   Klebsiella aerogenes NOT DETECTED NOT DETECTED Final   Klebsiella oxytoca NOT DETECTED NOT DETECTED Final   Klebsiella pneumoniae NOT DETECTED NOT DETECTED Final   Proteus species NOT DETECTED NOT DETECTED Final   Salmonella species NOT DETECTED NOT DETECTED Final   Serratia marcescens NOT DETECTED NOT DETECTED Final   Haemophilus influenzae NOT DETECTED NOT DETECTED Final   Neisseria meningitidis NOT DETECTED NOT DETECTED Final   Pseudomonas aeruginosa NOT DETECTED NOT DETECTED Final   Stenotrophomonas maltophilia NOT DETECTED NOT DETECTED Final   Candida albicans NOT DETECTED NOT DETECTED Final   Candida auris NOT DETECTED NOT DETECTED Final   Candida glabrata NOT DETECTED NOT DETECTED Final   Candida krusei NOT DETECTED NOT DETECTED Final   Candida parapsilosis NOT DETECTED NOT DETECTED Final   Candida tropicalis NOT DETECTED NOT DETECTED Final   Cryptococcus neoformans/gattii NOT DETECTED NOT DETECTED Final    Comment: Performed at [facility], [address], [place], [place] [address]  Resp panel by RT-PCR (RSV, Flu A&B,  Covid) Anterior Nasal Swab     Status: None   Collection Time: 10/11/22  2:36 PM   Specimen: Anterior Nasal Swab  Result Value Ref Range Status   SARS Coronavirus 2 by RT PCR NEGATIVE NEGATIVE Final   Influenza A by PCR NEGATIVE NEGATIVE Final   Influenza B by PCR NEGATIVE NEGATIVE Final    Comment: (NOTE) The Xpert Xpress SARS-CoV-2/FLU/RSV plus assay is intended as an aid in the diagnosis of influenza from Nasopharyngeal swab specimens and should not be used as a sole basis for treatment. Nasal washings and aspirates are unacceptable for Xpert Xpress SARS-CoV-2/FLU/RSV testing.  Fact Sheet for Patients: BloggerCourse.com  Fact Sheet for Healthcare Providers: SeriousBroker.it  This test is not yet approved or cleared by the [place] FDA and has been authorized for detection and/or diagnosis of SARS-CoV-2 by FDA under an Emergency Use Authorization (EUA). This EUA will remain in effect (meaning this test can be used) for the duration of the COVID-19 declaration under Section 564(b)(1) of the Act, 21 U.S.C. section 360bbb-3(b)(1), unless the authorization is terminated or revoked.     Resp Syncytial Virus by PCR NEGATIVE NEGATIVE Final    Comment: (NOTE) Fact Sheet for Patients: BloggerCourse.com  Fact Sheet for Healthcare Providers: SeriousBroker.it  This test is not yet approved or cleared by the [place] FDA and has been authorized for detection and/or diagnosis of SARS-CoV-2 by FDA under an Emergency Use Authorization (EUA). This EUA will remain in effect (meaning this test can be used) for the duration of the COVID-19 declaration under Section 564(b)(1) of the Act, 21 U.S.C. section 360bbb-3(b)(1), unless the authorization is terminated or revoked.  Performed at [facility], [address], [place], [place] [address]   Blood culture (routine x 2)      Status: None (Preliminary result)   Collection Time: 10/11/22  2:40 PM   Specimen: BLOOD LEFT HAND  Result Value Ref Range Status   Specimen Description BLOOD LEFT HAND  Final   Special Requests   Final    BOTTLES DRAWN AEROBIC AND ANAEROBIC Blood Culture results may not be optimal due to an inadequate volume of blood received in culture bottles   Culture   Final    NO GROWTH < 24 HOURS Performed at [facility], [address] [place], [place] [address]    Report Status PENDING  Incomplete  SARS Coronavirus  2 by RT PCR (hospital order, performed in [facility] hospital lab) *cepheid single result test* Anterior Nasal Swab     Status: None   Collection Time: 10/11/22  3:55 PM   Specimen: Anterior Nasal Swab  Result Value Ref Range Status   SARS Coronavirus 2 by RT PCR NEGATIVE NEGATIVE Final    Comment: Performed at [facility], [address], [place], [place] [address]  MRSA Next Gen by PCR, Nasal     Status: None   Collection Time: 10/11/22  4:37 PM   Specimen: Nasal Mucosa; Nasal Swab  Result Value Ref Range Status   MRSA by PCR Next Gen NOT DETECTED NOT DETECTED Final    Comment: (NOTE) The GeneXpert MRSA Assay (FDA approved for NASAL specimens only), is one component of a comprehensive MRSA colonization surveillance program. It is not intended to diagnose MRSA infection nor to guide or monitor treatment for MRSA infections. Test performance is not FDA approved in patients less than 67 years old. Performed at [facility], [address] [place], [place] [address]       Studies: ECHOCARDIOGRAM COMPLETE  Result Date: 10/12/2022    ECHOCARDIOGRAM REPORT   Patient Name:   [name] Date of Exam: 10/12/2022 Medical Rec #:  [id number]      Height:       67.0 in Accession #:    [id number]     Weight:       147.7 lb Date of Birth:  1940/07/19       BSA:          1.778 m Patient Age:    82 years       BP:           132/71 mmHg Patient Gender: F               HR:           65 bpm. Exam Location:  Inpatient Procedure: 2D Echo, Cardiac Doppler and Color Doppler Indications:    I48.92* Unspecified atrial flutter  History:        Patient has prior history of Echocardiogram examinations, most                 recent 05/08/2022. CHF; Risk Factors:Hypertension, Diabetes and                 Dyslipidemia.  Sonographer:    Irving Burton Senior RDCS Referring Phys: Clydie Braun IMPRESSIONS  1. Left ventricular ejection fraction, by estimation, is 60 to 65%. The left ventricle has normal function. The left ventricle has no regional wall motion abnormalities. There is mild left ventricular hypertrophy. Left ventricular diastolic parameters are consistent with Grade II diastolic dysfunction (pseudonormalization). Elevated left atrial pressure.  2. Right ventricular systolic function is normal. The right ventricular size is normal. There is mildly elevated pulmonary artery systolic pressure.  3. Left atrial size was mildly dilated.  4. Right atrial size was mildly dilated.  5. The mitral valve is normal in structure. Mild mitral valve regurgitation. No evidence of mitral stenosis.  6. The aortic valve is tricuspid. Aortic valve regurgitation is trivial. Aortic valve sclerosis is present, with no evidence of aortic valve stenosis.  7. The inferior vena cava is normal in size with greater than 50% respiratory variability, suggesting right atrial pressure of 3 mmHg. FINDINGS  Left Ventricle: Left ventricular ejection fraction, by estimation, is 60 to 65%. The left ventricle has normal function. The left  ventricle has no regional wall motion abnormalities. The left ventricular internal cavity size was normal in size. There is  mild left ventricular hypertrophy. Left ventricular diastolic parameters are consistent with Grade II diastolic dysfunction (pseudonormalization). Elevated left atrial pressure. Right Ventricle: The right ventricular size is normal. Right ventricular systolic function is  normal. There is mildly elevated pulmonary artery systolic pressure. The tricuspid regurgitant velocity is 3.09 m/s, and with an assumed right atrial pressure of 3 mmHg, the estimated right ventricular systolic pressure is 41.2 mmHg. Left Atrium: Left atrial size was mildly dilated. Right Atrium: Right atrial size was mildly dilated. Pericardium: There is no evidence of pericardial effusion. Mitral Valve: The mitral valve is normal in structure. There is severe calcification of the mitral valve leaflet(s). Mild mitral valve regurgitation. No evidence of mitral valve stenosis. Tricuspid Valve: The tricuspid valve is normal in structure. Tricuspid valve regurgitation is mild . No evidence of tricuspid stenosis. Aortic Valve: The aortic valve is tricuspid. Aortic valve regurgitation is trivial. Aortic valve sclerosis is present, with no evidence of aortic valve stenosis. Pulmonic Valve: The pulmonic valve was normal in structure. Pulmonic valve regurgitation is mild. No evidence of pulmonic stenosis. Aorta: The aortic root is normal in size and structure. Venous: The inferior vena cava is normal in size with greater than 50% respiratory variability, suggesting right atrial pressure of 3 mmHg. IAS/Shunts: No atrial level shunt detected by color flow Doppler.  LEFT VENTRICLE PLAX 2D LVIDd:         3.20 cm   Diastology LVIDs:         2.10 cm   LV e' medial:    5.11 cm/s LV PW:         1.00 cm   LV E/e' medial:  22.5 LV IVS:        1.70 cm   LV e' lateral:   6.53 cm/s LVOT diam:     1.90 cm   LV E/e' lateral: 17.6 LV SV:         54 LV SV Index:   30 LVOT Area:     2.84 cm  RIGHT VENTRICLE RV S prime:     10.00 cm/s TAPSE (M-mode): 1.7 cm LEFT ATRIUM             Index        RIGHT ATRIUM           Index LA diam:        3.20 cm 1.80 cm/m   RA Area:     22.00 cm LA Vol (A2C):   55.8 ml 31.39 ml/m  RA Volume:   64.20 ml  36.11 ml/m LA Vol (A4C):   42.6 ml 23.96 ml/m LA Biplane Vol: 49.0 ml 27.56 ml/m  AORTIC VALVE LVOT  Vmax:   87.20 cm/s LVOT Vmean:  62.500 cm/s LVOT VTI:    0.189 m  AORTA Ao Root diam: 3.20 cm Ao Asc diam:  3.10 cm MITRAL VALVE                TRICUSPID VALVE MV Area (PHT): 3.72 cm     TR Peak grad:   38.2 mmHg MV Decel Time: 204 msec     TR Vmax:        309.00 cm/s MV E velocity: 115.00 cm/s MV A velocity: 104.00 cm/s  SHUNTS MV E/A ratio:  1.11         Systemic VTI:  0.19 m  Systemic Diam: 1.90 cm Olga Millers MD Electronically signed by Olga Millers MD Signature Date/Time: 10/12/2022/1:49:36 PM    Final     Scheduled Meds:  acidophilus  1 capsule Oral BID   guaiFENesin  600 mg Oral BID   hydroxychloroquine  200 mg Oral Daily   insulin aspart  0-5 Units Subcutaneous QHS   insulin aspart  0-9 Units Subcutaneous TID WC   sodium chloride flush  3 mL Intravenous Q12H    Continuous Infusions:  sodium chloride 75 mL/hr at 10/12/22 1448   cefTRIAXone (ROCEPHIN)  IV 2 g (10/12/22 0910)   diltiazem (CARDIZEM) infusion 5 mg/hr (10/11/22 1556)   doxycycline (VIBRAMYCIN) IV 100 mg (10/12/22 0916)   heparin 1,250 Units/hr (10/12/22 1232)     LOS: 1 day     Briant Cedar, MD [facility]  If 7PM-7AM, please contact night-coverage [email or web address] 10/12/2022, 4:14 PM

## 2022-10-12 NOTE — Progress Notes (Signed)
ANTICOAGULATION CONSULT NOTE - Follow Up  Pharmacy Consult for Heparin Indication: atrial fibrillation  Allergies  Allergen Reactions   Ciprofloxacin Itching, Swelling and Other (See Comments)    Possibly causing tremors?   Lisinopril Cough   Crestor [Rosuvastatin] Other (See Comments)    Myalgia and back pain   Ofev [Nintedanib] Nausea Only    Patient Measurements: Height: 5\' 7"  (170.2 cm) Weight: 67 kg (147 lb 11.3 oz) IBW/kg (Calculated) : 61.6 Heparin Dosing Weight: 63.5 kg  Vital Signs: Temp: 98.4 F (36.9 C) (09/16 1128) Temp Source: Oral (09/16 1128) BP: 133/69 (09/16 1128) Pulse Rate: 73 (09/16 0756)  Labs: Recent Labs    10/11/22 1431 10/11/22 1608 10/12/22 0021 10/12/22 0215 10/12/22 1018  HGB 11.8*  --   --   --   --   HCT 36.2  --   --   --   --   PLT 212  --   --   --   --   HEPARINUNFRC  --   --  0.23*  --  0.27*  CREATININE 1.95*  --   --  1.19*  --   TROPONINIHS 14 15  --   --   --     Estimated Creatinine Clearance: 36.7 mL/min (A) (by C-G formula based on SCr of 1.19 mg/dL (H)).   Medical History: Past Medical History:  Diagnosis Date   Acute bronchitis 12/17/2021   Acute cystitis 02/20/2019   Breast cancer (HCC)    Diabetes mellitus without complication (HCC)    Hypertension    Hypothyroidism    Lupus (HCC)    Pneumonia 07/31/2021    Assessment: 82 yoF admitted with AFL. Pharmacy to dose heparin. Pt is not on AC PTA.   Heparin level subtherapeutic at 0.27, CBC stable, no infusion issues per nursing or bleeding.   Goal of Therapy:  Heparin level 0.3-0.7 units/ml Monitor platelets by anticoagulation protocol: Yes   Plan:  Increase heparin infusion to 1250 units/h Recheck heparin level in 8h  Fredonia Highland, PharmD, [place], [facility] Clinical Pharmacist [phone] Please check AMION for all [facility] Pharmacy numbers 10/12/2022

## 2022-10-12 NOTE — Plan of Care (Signed)

## 2022-10-12 NOTE — Plan of Care (Signed)
Problem: Activity: Goal: Ability to tolerate increased activity will improve Outcome: Progressing   Problem: Coping: Goal: Ability to adjust to condition or change in health will improve Outcome: Progressing   Problem: Skin Integrity: Goal: Risk for impaired skin integrity will decrease Outcome: Progressing

## 2022-10-13 DIAGNOSIS — Z7901 Long term (current) use of anticoagulants: Secondary | ICD-10-CM

## 2022-10-13 DIAGNOSIS — J189 Pneumonia, unspecified organism: Secondary | ICD-10-CM | POA: Diagnosis not present

## 2022-10-13 DIAGNOSIS — A419 Sepsis, unspecified organism: Secondary | ICD-10-CM | POA: Diagnosis not present

## 2022-10-13 LAB — URINE CULTURE

## 2022-10-13 LAB — CBC WITH DIFFERENTIAL/PLATELET
Abs Immature Granulocytes: 0.16 10*3/uL — ABNORMAL HIGH (ref 0.00–0.07)
Basophils Absolute: 0 10*3/uL (ref 0.0–0.1)
Basophils Relative: 0 %
Eosinophils Absolute: 0 10*3/uL (ref 0.0–0.5)
Eosinophils Relative: 0 %
HCT: 28.3 % — ABNORMAL LOW (ref 36.0–46.0)
Hemoglobin: 9.5 g/dL — ABNORMAL LOW (ref 12.0–15.0)
Immature Granulocytes: 1 %
Lymphocytes Relative: 6 %
Lymphs Abs: 1 10*3/uL (ref 0.7–4.0)
MCH: 29.1 pg (ref 26.0–34.0)
MCHC: 33.6 g/dL (ref 30.0–36.0)
MCV: 86.8 fL (ref 80.0–100.0)
Monocytes Absolute: 0.5 10*3/uL (ref 0.1–1.0)
Monocytes Relative: 3 %
Neutro Abs: 15.6 10*3/uL — ABNORMAL HIGH (ref 1.7–7.7)
Neutrophils Relative %: 90 %
Platelets: 208 10*3/uL (ref 150–400)
RBC: 3.26 MIL/uL — ABNORMAL LOW (ref 3.87–5.11)
RDW: 13.8 % (ref 11.5–15.5)
WBC: 17.3 10*3/uL — ABNORMAL HIGH (ref 4.0–10.5)
nRBC: 0 % (ref 0.0–0.2)

## 2022-10-13 LAB — GLUCOSE, CAPILLARY
Glucose-Capillary: 176 mg/dL — ABNORMAL HIGH (ref 70–99)
Glucose-Capillary: 160 mg/dL — ABNORMAL HIGH (ref 70–99)
Glucose-Capillary: 124 mg/dL — ABNORMAL HIGH (ref 70–99)
Glucose-Capillary: 143 mg/dL — ABNORMAL HIGH (ref 70–99)

## 2022-10-13 LAB — BASIC METABOLIC PANEL
Anion gap: 10 (ref 5–15)
BUN: 17 mg/dL (ref 8–23)
CO2: 16 mmol/L — ABNORMAL LOW (ref 22–32)
Calcium: 8.4 mg/dL — ABNORMAL LOW (ref 8.9–10.3)
Chloride: 105 mmol/L (ref 98–111)
Creatinine, Ser: 0.97 mg/dL (ref 0.44–1.00)
GFR, Estimated: 59 mL/min — ABNORMAL LOW (ref >60)
Glucose, Bld: 168 mg/dL — ABNORMAL HIGH (ref 70–99)
Potassium: 3.7 mmol/L (ref 3.5–5.1)
Sodium: 131 mmol/L — ABNORMAL LOW (ref 135–145)

## 2022-10-13 LAB — HEPARIN LEVEL (UNFRACTIONATED): Heparin Unfractionated: 0.43 [IU]/mL (ref 0.30–0.70)

## 2022-10-13 LAB — LEGIONELLA PNEUMOPHILA SEROGP 1 UR AG: L. pneumophila Serogp 1 Ur Ag: NEGATIVE

## 2022-10-13 MED ORDER — DICLOFENAC SODIUM 1 % EX GEL: 4.0000 g | Freq: Four times a day (QID) | CUTANEOUS | Status: DC | PRN

## 2022-10-13 MED ORDER — APIXABAN 5 MG PO TABS
5.0000 mg | ORAL_TABLET | Freq: Two times a day (BID) | ORAL | Status: DC
  Administered 2022-10-13 – 2022-10-16 (×6): 5 mg via ORAL
  Filled 2022-10-13 (×6): qty 1

## 2022-10-13 MED ORDER — ACETAMINOPHEN 325 MG PO TABS
650.0000 mg | ORAL_TABLET | Freq: Four times a day (QID) | ORAL | Status: DC | PRN
  Administered 2022-10-13 – 2022-10-14 (×2): 650 mg via ORAL
  Filled 2022-10-13 (×2): qty 2

## 2022-10-13 MED ORDER — DILTIAZEM HCL 60 MG PO TABS
60.0000 mg | ORAL_TABLET | Freq: Two times a day (BID) | ORAL | Status: DC
  Administered 2022-10-13 – 2022-10-16 (×6): 60 mg via ORAL
  Filled 2022-10-13 (×6): qty 1

## 2022-10-13 MED ORDER — GABAPENTIN 100 MG PO CAPS
100.0000 mg | ORAL_CAPSULE | Freq: Three times a day (TID) | ORAL | Status: DC
  Administered 2022-10-13 – 2022-10-16 (×9): 100 mg via ORAL
  Filled 2022-10-13 (×9): qty 1

## 2022-10-13 MED ORDER — POLYETHYLENE GLYCOL 3350 17 G PO PACK
17.0000 g | PACK | Freq: Every day | ORAL | Status: DC
  Administered 2022-10-13 – 2022-10-16 (×2): 17 g via ORAL
  Filled 2022-10-13 (×4): qty 1

## 2022-10-13 MED ORDER — TRAZODONE HCL 50 MG PO TABS
25.0000 mg | ORAL_TABLET | Freq: Every evening | ORAL | Status: DC | PRN
  Administered 2022-10-13 – 2022-10-14 (×2): 25 mg via ORAL
  Filled 2022-10-13 (×2): qty 1

## 2022-10-13 MED ORDER — SENNOSIDES-DOCUSATE SODIUM 8.6-50 MG PO TABS
1.0000 | ORAL_TABLET | Freq: Every day | ORAL | Status: DC
  Administered 2022-10-13 – 2022-10-15 (×3): 1 via ORAL
  Filled 2022-10-13 (×3): qty 1

## 2022-10-13 NOTE — Progress Notes (Addendum)
PROGRESS NOTE  [name]:[id number] DOB: 1941-01-20 DOA: 10/11/2022 PCP: Merri Brunette, MD  HPI/Recap of past 24 hours: [name] is a 82 y.o. female with medical history significant of HTN, CAD, atrial flutter, interstitial lung disease, DM type 2, CKD stage III, SLE, hypothyroidism, and remote history of breast cancer s/p lumpectomy who presents with complaints of cough, SOB, fever X 1 week. En route with EMS, patient was noted to have heart rates into the 180s.  She was given 10 mg of diltiazem IV and 500 mL bolus of normal saline IV fluids. In the ED, HR elevated into the 140s, respirations 16-34, O2 saturation maintained on room air.  Labs significant for WBC 16.5, BNP 403.8, high-sensitivity troponin 14, and lactic acid 2.5.  Chest x-ray noted findings consistent with interstitial disease with no significant evidence of edema or focal infiltrate.  Cardiology consulted.  Patient admitted for further management.     Today, pt slowly improving, denies any chest pain, fever/chills      Assessment/Plan: Principal Problem:   Sepsis due to pneumonia [facility]) Active Problems:   UTI (urinary tract infection)   Paroxysmal atrial flutter (HCC)   Hyponatremia   AKI (acute kidney injury) (HCC)   Diarrhea   Diabetes mellitus type 2, noninsulin dependent (HCC)   Chronic diastolic CHF (congestive heart failure) (HCC)   Hypothyroidism   History of systemic lupus erythematosus (SLE) (HCC)   ILD (interstitial lung disease) (HCC)   Controlled type 2 diabetes mellitus without complication, without long-term current use of insulin (HCC)   Mixed hyperlipidemia   Benign hypertension   Acute febrile illness    Severe sepsis Likely 2/2 multifocal PNA with strep pneumoniae + possible UTI + bacteremia On admission, tachycardic, tachypneic, leukocytosis, elevated LA Currently afebrile, with leukocytosis BC x 2, growing strep pneumoniae Procalcitonin 7.05 Urine strep pneumoniae  positive, urine legionella negative UA with small leukocytes, positive nitrite, WBC 21-50, bacteria rare CTA chest with multifocal pneumonia Flu and covid negative LA resolved S/p IVF Continue Rocephin, d/c Doxycycline Monitor closely  Strep pneumo bacteremia BC x 2, with 1 set growing strep pneumoniae Management as above   UTI Urinalysis was small leukocytosis, positive leukocytosis, rare bacteria, and wbc 21-50 UC pending AB as above   Paroxysmal atrial flutter ?Hx of atrial flutter back in 2021 when patient was admitted with syncope CHA2DS2-VASc score = at 5 Continue Cardizem drip, cardiology to wean to oral S/p heparin--> switch to [facility] Cardiology consulted  Elevated d-dimer Likely in the setting of hx of lupus, sepsis CTA chest no PE   Acute kidney injury on CKD stage 3a Patient presents with creatinine elevated at 1.95 with BUN 35 Improved s/p IVF Daily BMP   Hyponatremia Acute on chronic, 126 on presentation Improving S/p IVF Daily BMP   Chronic diastolic HF BNP elevated at 403 Last echocardiogram from 04/2022 noted EF to be 60-65 percent with grade 1 diastolic dysfunction Daily weights   Diabetes mellitus type 2, with hyperglycemia  Last hemoglobin A1c was 6.7 on 04/25/2022 SSI, accuchecks, hypoglycemic protocols   Interstitial lung disease Lupus/autoimmune disease Hold Plaquenil in the setting of sepsis Continue outpatient follow-up with pulmonology and rheumatology   Hypothyroidism Patient reported that she was no longer taking levothyroxine. TSH 4.1 Continue to monitor      Estimated body mass index is 22.03 kg/m as calculated from the following:   Height as of this encounter: 5\' 7"  (1.702 m).   Weight as of this encounter: 63.8  kg.     Code Status: Full  Family Communication: None at bedside  Disposition Plan: Status is: Inpatient Remains inpatient appropriate because: level of  care      Consultants: Cardiology  Procedures: None  Antimicrobials: Ceftriaxone   DVT prophylaxis:  Heparin drip--> Eliquis   Objective: Vitals:   10/13/22 0550 10/13/22 0812 10/13/22 1100 10/13/22 1621  BP: 134/82 136/87 137/77 (!) 144/72  Pulse: (!) 107 (!) 109 76 80  Resp: 20 20 12 20   Temp: 98 F (36.7 C) 98.7 F (37.1 C) 98.9 F (37.2 C) 98.4 F (36.9 C)  TempSrc: Oral Oral Oral Oral  SpO2: 96%   95%  Weight: 63.8 kg     Height:        Intake/Output Summary (Last 24 hours) at 10/13/2022 1704 Last data filed at 10/13/2022 1530 Gross per 24 hour  Intake 2373.77 ml  Output --  Net 2373.77 ml   Filed Weights   10/11/22 1431 10/12/22 0504 10/13/22 0550  Weight: 63.5 kg 67 kg 63.8 kg    Exam: General: NAD Cardiovascular: S1, S2 present Respiratory: CTAB Abdomen: Soft, nontender, nondistended, bowel sounds present Musculoskeletal: No bilateral pedal edema noted Skin: Normal Psychiatry: Normal mood     Data Reviewed: CBC: Recent Labs  Lab 10/11/22 1431 10/13/22 0212  WBC 16.5* 17.3*  NEUTROABS  --  15.6*  HGB 11.8* 9.5*  HCT 36.2 28.3*  MCV 88.9 86.8  PLT 212 208   Basic Metabolic Panel: Recent Labs  Lab 10/11/22 1431 10/12/22 0215 10/13/22 0212  NA 126* 130* 131*  K 3.8 4.1 3.7  CL 97* 105 105  CO2 19* 18* 16*  GLUCOSE 117* 163* 168*  BUN 33* 23 17  CREATININE 1.95* 1.19* 0.97  CALCIUM 8.5* 8.3* 8.4*  MG 1.7 2.2  --    GFR: Estimated Creatinine Clearance: 45 mL/min (by C-G formula based on SCr of 0.97 mg/dL). Liver Function Tests: Recent Labs  Lab 10/11/22 1431  AST 25  ALT 11  ALKPHOS 60  BILITOT 1.0  PROT 8.1  ALBUMIN 2.9*   No results for input(s): "LIPASE", "AMYLASE" in the last 168 hours. No results for input(s): "AMMONIA" in the last 168 hours. Coagulation Profile: No results for input(s): "INR", "PROTIME" in the last 168 hours. Cardiac Enzymes: No results for input(s): "CKTOTAL", "CKMB", "CKMBINDEX",  "TROPONINI" in the last 168 hours. BNP (last 3 results) Recent Labs    03/26/22 1525 06/01/22 1518  PROBNP 32.0 97.0   HbA1C: No results for input(s): "HGBA1C" in the last 72 hours. CBG: Recent Labs  Lab 10/12/22 1530 10/12/22 2140 10/13/22 0543 10/13/22 1104 10/13/22 1623  GLUCAP 152* 136* 176* 160* 124*   Lipid Profile: No results for input(s): "CHOL", "HDL", "LDLCALC", "TRIG", "CHOLHDL", "LDLDIRECT" in the last 72 hours. Thyroid Function Tests: Recent Labs    10/11/22 1608  TSH 4.162   Anemia Panel: No results for input(s): "VITAMINB12", "FOLATE", "FERRITIN", "TIBC", "IRON", "RETICCTPCT" in the last 72 hours. Urine analysis:    Component Value Date/Time   COLORURINE YELLOW 10/11/2022 1608   APPEARANCEUR CLEAR 10/11/2022 1608   LABSPEC 1.010 10/11/2022 1608   PHURINE 6.0 10/11/2022 1608   GLUCOSEU NEGATIVE 10/11/2022 1608   HGBUR SMALL (A) 10/11/2022 1608   BILIRUBINUR NEGATIVE 10/11/2022 1608   BILIRUBINUR negative 04/25/2022 1221   KETONESUR NEGATIVE 10/11/2022 1608   PROTEINUR NEGATIVE 10/11/2022 1608   UROBILINOGEN 1.0 04/25/2022 1221   UROBILINOGEN 0.2 06/20/2014 1842   NITRITE POSITIVE (A) 10/11/2022 1608  LEUKOCYTESUR SMALL (A) 10/11/2022 1608   Sepsis Labs: @LABRCNTIP (procalcitonin:4,lacticidven:4)  ) Recent Results (from the past 240 hour(s))  SARS CORONAVIRUS 2 (TAT 6-24 HRS) Anterior Nasal Swab     Status: None   Collection Time: 10/04/22  3:02 PM   Specimen: Anterior Nasal Swab  Result Value Ref Range Status   SARS Coronavirus 2 NEGATIVE NEGATIVE Final    Comment: (NOTE) SARS-CoV-2 target nucleic acids are NOT DETECTED.  The SARS-CoV-2 RNA is generally detectable in upper and lower respiratory specimens during the acute phase of infection. Negative results do not preclude SARS-CoV-2 infection, do not rule out co-infections with other pathogens, and should not be used as the sole basis for treatment or other patient management  decisions. Negative results must be combined with clinical observations, patient history, and epidemiological information. The expected result is Negative.  Fact Sheet for Patients: HairSlick.no  Fact Sheet for Healthcare Providers: quierodirigir.com  This test is not yet approved or cleared by the [place] FDA and  has been authorized for detection and/or diagnosis of SARS-CoV-2 by FDA under an Emergency Use Authorization (EUA). This EUA will remain  in effect (meaning this test can be used) for the duration of the COVID-19 declaration under Se ction 564(b)(1) of the Act, 21 U.S.C. section 360bbb-3(b)(1), unless the authorization is terminated or revoked sooner.  Performed at [facility], [address] [place], [place] [address]   Blood culture (routine x 2)     Status: Abnormal (Preliminary result)   Collection Time: 10/11/22  2:35 PM   Specimen: BLOOD RIGHT FOREARM  Result Value Ref Range Status   Specimen Description BLOOD RIGHT FOREARM  Final   Special Requests   Final    BOTTLES DRAWN AEROBIC AND ANAEROBIC Blood Culture results may not be optimal due to an excessive volume of blood received in culture bottles   Culture  Setup Time   Final    GRAM POSITIVE COCCI IN PAIRS AEROBIC BOTTLE ONLY CRITICAL RESULT CALLED TO, READ BACK BY AND VERIFIED WITH: PHARMD B AGEE [id number] AT 1021 AM BY CM    Culture (A)  Final    STREPTOCOCCUS PNEUMONIAE SUSCEPTIBILITIES TO FOLLOW Performed at [facility], [address], [place], [place] [address]    Report Status PENDING  Incomplete  Blood Culture ID Panel (Reflexed)     Status: Abnormal   Collection Time: 10/11/22  2:35 PM  Result Value Ref Range Status   Enterococcus faecalis NOT DETECTED NOT DETECTED Final   Enterococcus Faecium NOT DETECTED NOT DETECTED Final   Listeria monocytogenes NOT DETECTED NOT DETECTED Final   Staphylococcus species NOT DETECTED NOT  DETECTED Final   Staphylococcus aureus (BCID) NOT DETECTED NOT DETECTED Final   Staphylococcus epidermidis NOT DETECTED NOT DETECTED Final   Staphylococcus lugdunensis NOT DETECTED NOT DETECTED Final   Streptococcus species DETECTED (A) NOT DETECTED Final    Comment: CRITICAL RESULT CALLED TO, READ BACK BY AND VERIFIED WITH: PHARMD B AGEE [id number] AR 1021 AM BY CM    Streptococcus agalactiae NOT DETECTED NOT DETECTED Final   Streptococcus pneumoniae DETECTED (A) NOT DETECTED Final    Comment: CRITICAL RESULT CALLED TO, READ BACK BY AND VERIFIED WITH: PHARMD B AGEE [id number] AT 1021 AM BY CM    Streptococcus pyogenes NOT DETECTED NOT DETECTED Final   A.calcoaceticus-baumannii NOT DETECTED NOT DETECTED Final   Bacteroides fragilis NOT DETECTED NOT DETECTED Final   Enterobacterales NOT DETECTED NOT DETECTED Final   Enterobacter cloacae complex  NOT DETECTED NOT DETECTED Final   Escherichia coli NOT DETECTED NOT DETECTED Final   Klebsiella aerogenes NOT DETECTED NOT DETECTED Final   Klebsiella oxytoca NOT DETECTED NOT DETECTED Final   Klebsiella pneumoniae NOT DETECTED NOT DETECTED Final   Proteus species NOT DETECTED NOT DETECTED Final   Salmonella species NOT DETECTED NOT DETECTED Final   Serratia marcescens NOT DETECTED NOT DETECTED Final   Haemophilus influenzae NOT DETECTED NOT DETECTED Final   Neisseria meningitidis NOT DETECTED NOT DETECTED Final   Pseudomonas aeruginosa NOT DETECTED NOT DETECTED Final   Stenotrophomonas maltophilia NOT DETECTED NOT DETECTED Final   Candida albicans NOT DETECTED NOT DETECTED Final   Candida auris NOT DETECTED NOT DETECTED Final   Candida glabrata NOT DETECTED NOT DETECTED Final   Candida krusei NOT DETECTED NOT DETECTED Final   Candida parapsilosis NOT DETECTED NOT DETECTED Final   Candida tropicalis NOT DETECTED NOT DETECTED Final   Cryptococcus neoformans/gattii NOT DETECTED NOT DETECTED Final    Comment: Performed at [facility], [address] [place], [place] [address]  Resp panel by RT-PCR (RSV, Flu A&B, Covid) Anterior Nasal Swab     Status: None   Collection Time: 10/11/22  2:36 PM   Specimen: Anterior Nasal Swab  Result Value Ref Range Status   SARS Coronavirus 2 by RT PCR NEGATIVE NEGATIVE Final   Influenza A by PCR NEGATIVE NEGATIVE Final   Influenza B by PCR NEGATIVE NEGATIVE Final    Comment: (NOTE) The Xpert Xpress SARS-CoV-2/FLU/RSV plus assay is intended as an aid in the diagnosis of influenza from Nasopharyngeal swab specimens and should not be used as a sole basis for treatment. Nasal washings and aspirates are unacceptable for Xpert Xpress SARS-CoV-2/FLU/RSV testing.  Fact Sheet for Patients: BloggerCourse.com  Fact Sheet for Healthcare Providers: SeriousBroker.it  This test is not yet approved or cleared by the [place] FDA and has been authorized for detection and/or diagnosis of SARS-CoV-2 by FDA under an Emergency Use Authorization (EUA). This EUA will remain in effect (meaning this test can be used) for the duration of the COVID-19 declaration under Section 564(b)(1) of the Act, 21 U.S.C. section 360bbb-3(b)(1), unless the authorization is terminated or revoked.     Resp Syncytial Virus by PCR NEGATIVE NEGATIVE Final    Comment: (NOTE) Fact Sheet for Patients: BloggerCourse.com  Fact Sheet for Healthcare Providers: SeriousBroker.it  This test is not yet approved or cleared by the [place] FDA and has been authorized for detection and/or diagnosis of SARS-CoV-2 by FDA under an Emergency Use Authorization (EUA). This EUA will remain in effect (meaning this test can be used) for the duration of the COVID-19 declaration under Section 564(b)(1) of the Act, 21 U.S.C. section 360bbb-3(b)(1), unless the authorization is terminated or revoked.  Performed at [facility], [address], [place], [place] [address]   Blood culture (routine x 2)     Status: None (Preliminary result)   Collection Time: 10/11/22  2:40 PM   Specimen: BLOOD LEFT HAND  Result Value Ref Range Status   Specimen Description BLOOD LEFT HAND  Final   Special Requests   Final    BOTTLES DRAWN AEROBIC AND ANAEROBIC Blood Culture results may not be optimal due to an inadequate volume of blood received in culture bottles   Culture   Final    NO GROWTH 2 DAYS Performed at [facility], [address], [place], [place] [address]    Report Status  PENDING  Incomplete  SARS Coronavirus 2 by RT PCR (hospital order, performed in [facility] hospital lab) *cepheid single result test* Anterior Nasal Swab     Status: None   Collection Time: 10/11/22  3:55 PM   Specimen: Anterior Nasal Swab  Result Value Ref Range Status   SARS Coronavirus 2 by RT PCR NEGATIVE NEGATIVE Final    Comment: Performed at [facility], [address], [place], [place] [address]  MRSA Next Gen by PCR, Nasal     Status: None   Collection Time: 10/11/22  4:37 PM   Specimen: Nasal Mucosa; Nasal Swab  Result Value Ref Range Status   MRSA by PCR Next Gen NOT DETECTED NOT DETECTED Final    Comment: (NOTE) The GeneXpert MRSA Assay (FDA approved for NASAL specimens only), is one component of a comprehensive MRSA colonization surveillance program. It is not intended to diagnose MRSA infection nor to guide or monitor treatment for MRSA infections. Test performance is not FDA approved in patients less than 39 years old. Performed at [facility], [address] [place], [place] [address]       Studies: No results found.  Scheduled Meds:  acidophilus  1 capsule Oral BID   apixaban  5 mg Oral BID   dextromethorphan-guaiFENesin  1 tablet Oral BID   gabapentin  100 mg Oral Q8H   insulin aspart  0-5 Units Subcutaneous QHS   insulin aspart  0-9 Units Subcutaneous TID WC   pantoprazole  40 mg Oral Daily    polyethylene glycol  17 g Oral Daily   senna-docusate  1 tablet Oral QHS   sodium chloride flush  3 mL Intravenous Q12H    Continuous Infusions:  cefTRIAXone (ROCEPHIN)  IV Stopped (10/13/22 1142)   diltiazem (CARDIZEM) infusion 5 mg/hr (10/13/22 1143)   heparin 1,250 Units/hr (10/13/22 1326)     LOS: 2 days     Briant Cedar, MD [facility]  If 7PM-7AM, please contact night-coverage [email or web address] 10/13/2022, 5:04 PM

## 2022-10-13 NOTE — Plan of Care (Signed)

## 2022-10-13 NOTE — Progress Notes (Signed)
Progress Note  Patient Name: [name] MRN: [id number] DOB: [birth year] Date of Encounter: 10/13/2022  Attending physician: Briant Cedar, MD Primary care provider: Merri Brunette, MD Primary Cardiologist: Dr. Yates Decamp  Subjective: [name] is a 82 y.o. African-American female who was seen and examined at bedside  Resting in bed comfortably. Denies anginal chest pain or heart failure symptoms. Remains afebrile. Remains in normal sinus rhythm Case discussed and reviewed with her nurse.  Objective: Vital Signs in the last 24 hours: Temp:  [97.6 F (36.4 C)-98.9 F (37.2 C)] 98.4 F (36.9 C) (09/17 1621) Pulse Rate:  [71-109] 80 (09/17 1621) Resp:  [12-26] 20 (09/17 1621) BP: (134-146)/(72-87) 144/72 (09/17 1621) SpO2:  [95 %-98 %] 95 % (09/17 1621) Weight:  [63.8 kg] 63.8 kg (09/17 0550)  Intake/Output:  Intake/Output Summary (Last 24 hours) at 10/13/2022 1935 Last data filed at 10/13/2022 1530 Gross per 24 hour  Intake 2124.22 ml  Output --  Net 2124.22 ml    Net IO Since Admission: 4,588.24 mL [10/13/22 1935]  Weights:     10/13/2022    5:50 AM 10/12/2022    5:04 AM 10/11/2022    2:31 PM  Last 3 Weights  Weight (lbs) 140 lb 10.5 oz 147 lb 11.3 oz 140 lb  Weight (kg) 63.8 kg 67 kg 63.504 kg      Telemetry:  Overnight telemetry shows sinus rhythm, which I personally reviewed.   Physical examination: PHYSICAL EXAM: Vitals:   10/13/22 0550 10/13/22 0812 10/13/22 1100 10/13/22 1621  BP: 134/82 136/87 137/77 (!) 144/72  Pulse: (!) 107 (!) 109 76 80  Resp: 20 20 12 20   Temp: 98 F (36.7 C) 98.7 F (37.1 C) 98.9 F (37.2 C) 98.4 F (36.9 C)  TempSrc: Oral Oral Oral Oral  SpO2: 96%   95%  Weight: 63.8 kg     Height:        Physical Exam  Constitutional: No distress. She appears chronically ill.  hemodynamically stable.   Neck: No JVD present.  Cardiovascular: Normal rate, regular rhythm, S1 normal, S2 normal, intact distal pulses and  normal pulses. Exam reveals no gallop, no S3 and no S4.  No murmur heard. Pulmonary/Chest: Effort normal and breath sounds normal. No stridor. She has no wheezes. She has no rales.  Abdominal: Soft. Bowel sounds are normal. She exhibits no distension. There is no abdominal tenderness.  Musculoskeletal:        General: No edema.     Cervical back: Neck supple.  Neurological: She is alert and oriented to person, place, and time. She has intact cranial nerves (2-12).  Skin: Skin is warm and moist.    Lab Results: Chemistry Recent Labs  Lab 10/11/22 1431 10/12/22 0215 10/13/22 0212  NA 126* 130* 131*  K 3.8 4.1 3.7  CL 97* 105 105  CO2 19* 18* 16*  GLUCOSE 117* 163* 168*  BUN 33* 23 17  CREATININE 1.95* 1.19* 0.97  CALCIUM 8.5* 8.3* 8.4*  PROT 8.1  --   --   ALBUMIN 2.9*  --   --   AST 25  --   --   ALT 11  --   --   ALKPHOS 60  --   --   BILITOT 1.0  --   --   GFRNONAA 26* 46* 59*  ANIONGAP 10 7 10     Hematology Recent Labs  Lab 10/11/22 1431 10/13/22 0212  WBC 16.5* 17.3*  RBC 4.07 3.26*  HGB  11.8* 9.5*  HCT 36.2 28.3*  MCV 88.9 86.8  MCH 29.0 29.1  MCHC 32.6 33.6  RDW 13.7 13.8  PLT 212 208   High Sensitivity Troponin:   Recent Labs  Lab 10/11/22 1431 10/11/22 1608  TROPONINIHS 14 15     Cardiac EnzymesNo results for input(s): "TROPONINI" in the last 168 hours. No results for input(s): "TROPIPOC" in the last 168 hours.  BNP Recent Labs  Lab 10/11/22 1431  BNP 403.8*    DDimer  Recent Labs  Lab 10/11/22 1431  DDIMER 2.83*    Hemoglobin A1c:  Lab Results  Component Value Date   HGBA1C 6.7 (H) 04/25/2022   MPG 146 04/25/2022   TSH  Recent Labs    04/25/22 1702 04/25/22 2211 10/11/22 1608  TSH 3.262 3.005 4.162   Lipid Panel  Lab Results  Component Value Date   CHOL 152 02/20/2019   HDL 37 (L) 02/20/2019   LDLCALC 88 02/20/2019   TRIG 134 02/20/2019   CHOLHDL 4.1 02/20/2019   Drugs of Abuse  No results found for: "LABOPIA",  "COCAINSCRNUR", "LABBENZ", "AMPHETMU", "THCU", "LABBARB"    Imaging: CT Angio Chest Pulmonary Embolism (PE) W or WO Contrast  Result Date: 10/12/2022 CLINICAL DATA:  Shortness of breath. Pulmonary embolism (PE) suspected, low to intermediate prob, positive D-dimer Elevated d-dimer EXAM: CT ANGIOGRAPHY CHEST WITH CONTRAST TECHNIQUE: Multidetector CT imaging of the chest was performed using the standard protocol during bolus administration of intravenous contrast. Multiplanar CT image reconstructions and MIPs were obtained to evaluate the vascular anatomy. RADIATION DOSE REDUCTION: This exam was performed according to the departmental dose-optimization program which includes automated exposure control, adjustment of the mA and/or kV according to patient size and/or use of iterative reconstruction technique. CONTRAST:  75mL OMNIPAQUE IOHEXOL 350 MG/ML SOLN COMPARISON:  CT 04/26/2022, x-ray 10/11/2022 FINDINGS: Cardiovascular: Satisfactory opacification of the pulmonary arteries to the segmental level. No evidence of pulmonary embolism. Thoracic aorta is nonaneurysmal. Atherosclerotic calcification of the aorta and coronary arteries. Mild cardiomegaly. No pericardial effusion. Mediastinum/Nodes: Mildly enlarged mediastinal and right hilar lymph nodes, likely reactive. No axillary lymphadenopathy. Trachea within normal limits. Small hiatal hernia. Lungs/Pleura: Patchy airspace opacities within the bilateral lower lobes, right middle lobe, and right upper lobe. No pleural effusion or pneumothorax. Upper Abdomen: No acute abnormality. Musculoskeletal: Stable calcified mass in the medial left breast measuring up to 4.6 cm. No new or acute bony findings. Review of the MIP images confirms the above findings. IMPRESSION: 1. No evidence of pulmonary embolism. 2. Patchy airspace opacities within the bilateral lower lobes, right middle lobe, and right upper lobe, compatible with multifocal pneumonia. 3. Mildly enlarged  mediastinal and right hilar lymph nodes, likely reactive. 4. Aortic and coronary artery atherosclerosis (ICD10-I70.0). 5. Stable calcified mass in the medial left breast measuring up to 4.6 cm. Correlate with prior mammographic workup. Electronically Signed   By: Duanne Guess D.O.   On: 10/12/2022 17:51   ECHOCARDIOGRAM COMPLETE  Result Date: 10/12/2022    ECHOCARDIOGRAM REPORT   Patient Name:   [name] Date of Exam: 10/12/2022 Medical Rec #:  [id number]      Height:       67.0 in Accession #:    [id number]     Weight:       147.7 lb Date of Birth:  March 29, 1940       BSA:          1.778 m Patient Age:    3 years  BP:           132/71 mmHg Patient Gender: F              HR:           65 bpm. Exam Location:  Inpatient Procedure: 2D Echo, Cardiac Doppler and Color Doppler Indications:    I48.92* Unspecified atrial flutter  History:        Patient has prior history of Echocardiogram examinations, most                 recent 05/08/2022. CHF; Risk Factors:Hypertension, Diabetes and                 Dyslipidemia.  Sonographer:    Irving Burton Senior RDCS Referring Phys: Clydie Braun IMPRESSIONS  1. Left ventricular ejection fraction, by estimation, is 60 to 65%. The left ventricle has normal function. The left ventricle has no regional wall motion abnormalities. There is mild left ventricular hypertrophy. Left ventricular diastolic parameters are consistent with Grade II diastolic dysfunction (pseudonormalization). Elevated left atrial pressure.  2. Right ventricular systolic function is normal. The right ventricular size is normal. There is mildly elevated pulmonary artery systolic pressure.  3. Left atrial size was mildly dilated.  4. Right atrial size was mildly dilated.  5. The mitral valve is normal in structure. Mild mitral valve regurgitation. No evidence of mitral stenosis.  6. The aortic valve is tricuspid. Aortic valve regurgitation is trivial. Aortic valve sclerosis is present, with no evidence of  aortic valve stenosis.  7. The inferior vena cava is normal in size with greater than 50% respiratory variability, suggesting right atrial pressure of 3 mmHg. FINDINGS  Left Ventricle: Left ventricular ejection fraction, by estimation, is 60 to 65%. The left ventricle has normal function. The left ventricle has no regional wall motion abnormalities. The left ventricular internal cavity size was normal in size. There is  mild left ventricular hypertrophy. Left ventricular diastolic parameters are consistent with Grade II diastolic dysfunction (pseudonormalization). Elevated left atrial pressure. Right Ventricle: The right ventricular size is normal. Right ventricular systolic function is normal. There is mildly elevated pulmonary artery systolic pressure. The tricuspid regurgitant velocity is 3.09 m/s, and with an assumed right atrial pressure of 3 mmHg, the estimated right ventricular systolic pressure is 41.2 mmHg. Left Atrium: Left atrial size was mildly dilated. Right Atrium: Right atrial size was mildly dilated. Pericardium: There is no evidence of pericardial effusion. Mitral Valve: The mitral valve is normal in structure. There is severe calcification of the mitral valve leaflet(s). Mild mitral valve regurgitation. No evidence of mitral valve stenosis. Tricuspid Valve: The tricuspid valve is normal in structure. Tricuspid valve regurgitation is mild . No evidence of tricuspid stenosis. Aortic Valve: The aortic valve is tricuspid. Aortic valve regurgitation is trivial. Aortic valve sclerosis is present, with no evidence of aortic valve stenosis. Pulmonic Valve: The pulmonic valve was normal in structure. Pulmonic valve regurgitation is mild. No evidence of pulmonic stenosis. Aorta: The aortic root is normal in size and structure. Venous: The inferior vena cava is normal in size with greater than 50% respiratory variability, suggesting right atrial pressure of 3 mmHg. IAS/Shunts: No atrial level shunt detected  by color flow Doppler.  LEFT VENTRICLE PLAX 2D LVIDd:         3.20 cm   Diastology LVIDs:         2.10 cm   LV e' medial:    5.11 cm/s LV PW:  1.00 cm   LV E/e' medial:  22.5 LV IVS:        1.70 cm   LV e' lateral:   6.53 cm/s LVOT diam:     1.90 cm   LV E/e' lateral: 17.6 LV SV:         54 LV SV Index:   30 LVOT Area:     2.84 cm  RIGHT VENTRICLE RV S prime:     10.00 cm/s TAPSE (M-mode): 1.7 cm LEFT ATRIUM             Index        RIGHT ATRIUM           Index LA diam:        3.20 cm 1.80 cm/m   RA Area:     22.00 cm LA Vol (A2C):   55.8 ml 31.39 ml/m  RA Volume:   64.20 ml  36.11 ml/m LA Vol (A4C):   42.6 ml 23.96 ml/m LA Biplane Vol: 49.0 ml 27.56 ml/m  AORTIC VALVE LVOT Vmax:   87.20 cm/s LVOT Vmean:  62.500 cm/s LVOT VTI:    0.189 m  AORTA Ao Root diam: 3.20 cm Ao Asc diam:  3.10 cm MITRAL VALVE                TRICUSPID VALVE MV Area (PHT): 3.72 cm     TR Peak grad:   38.2 mmHg MV Decel Time: 204 msec     TR Vmax:        309.00 cm/s MV E velocity: 115.00 cm/s MV A velocity: 104.00 cm/s  SHUNTS MV E/A ratio:  1.11         Systemic VTI:  0.19 m                             Systemic Diam: 1.90 cm Olga Millers MD Electronically signed by Olga Millers MD Signature Date/Time: 10/12/2022/1:49:36 PM    Final     CARDIAC DATABASE: EKG: 10/11/2022: Atrial flutter 2:1 conduction, rare PVCs, left axis.  916/2024: Sinus rhythm, 67 bpm, without underlying ischemia injury pattern.  Echocardiogram: 10/12/2022  1. Left ventricular ejection fraction, by estimation, is 60 to 65%. The  left ventricle has normal function. The left ventricle has no regional  wall motion abnormalities. There is mild left ventricular hypertrophy.  Left ventricular diastolic parameters  are consistent with Grade II diastolic dysfunction (pseudonormalization).  Elevated left atrial pressure.   2. Right ventricular systolic function is normal. The right ventricular  size is normal. There is mildly elevated pulmonary  artery systolic  pressure.   3. Left atrial size was mildly dilated.   4. Right atrial size was mildly dilated.   5. The mitral valve is normal in structure. Mild mitral valve  regurgitation. No evidence of mitral stenosis.   6. The aortic valve is tricuspid. Aortic valve regurgitation is trivial.  Aortic valve sclerosis is present, with no evidence of aortic valve  stenosis.   7. The inferior vena cava is normal in size with greater than 50%  respiratory variability, suggesting right atrial pressure of 3 mmHg.   Stress test: Lexiscan myoview stress test 07/03/2016: 1. The resting electrocardiogram demonstrated normal sinus rhythm, normal resting conduction, PVC and normal rest repolarization. Stress EKG is non-diagnostic for ischemia as it a pharmacologic stress using Lexiscan. Stress symptoms included dyspnea. 2. Myocardial perfusion imaging is normal. Overall left ventricular systolic function was normal  without regional wall motion abnormalities. The left ventricular ejection fraction was 75%.  Heart catheterization: None  Scheduled Meds:  acidophilus  1 capsule Oral BID   apixaban  5 mg Oral BID   dextromethorphan-guaiFENesin  1 tablet Oral BID   diltiazem  60 mg Oral Q12H   gabapentin  100 mg Oral Q8H   insulin aspart  0-5 Units Subcutaneous QHS   insulin aspart  0-9 Units Subcutaneous TID WC   pantoprazole  40 mg Oral Daily   polyethylene glycol  17 g Oral Daily   senna-docusate  1 tablet Oral QHS   sodium chloride flush  3 mL Intravenous Q12H    Continuous Infusions:  cefTRIAXone (ROCEPHIN)  IV Stopped (10/13/22 1142)    PRN Meds: guaiFENesin-dextromethorphan, ipratropium-albuterol, traZODone   IMPRESSION & RECOMMENDATIONS: [name] is a 82 y.o. African-American female whose past medical history and cardiac risk factors include: Hypertension, hyperlipidemia, diabetes mellitus type 2, chronic kidney disease, coronary calcification by nongated CT study, lupus on  Plaquenil, history of breast cancer status post right breast lumpectomy, history of recurrent urinary tract infections, ILD.   Impression: Paroxysmal atrial flutter. Multifocal pneumonia with strep pneumoniae with bacteremia Urinary tract infection. Acute kidney injury-resolved. Hypertension. Hyperlipidemia. Diabetes mellitus type 2. ILD. Lupus. History of breast cancer. History of urinary tract infections. Aortic atherosclerosis  Recommendations: Paroxysmal atrial flutter: In the past has had intermittent episodes but due to short durations she was not placed on medical therapy. Since the current duration is unknown she is currently started on AV nodal blocking agents and thromboembolic prophylaxis given high CHA2DS2-VASc score.  IV heparin transition to Eliquis. Discontinue IV diltiazem drip and transition to diltiazem 60 mg p.o. twice daily Click Here to Calculate/Change CHADS2VASc Score The patient's CHADS2-VASc score is 6, indicating a 9.7% annual risk of stroke.   CHF History: No HTN History: Yes Diabetes History: Yes Stroke History: No Vascular Disease History: Yes Spoke to her daughter over the phone who is in agreement with continuing her on oral anticoagulation given her CHA2DS2-VASc score and her history. Recommend outpatient follow-up with Dr. Jacinto Halim, primary cardiologist. Recommend avoiding antiarrhythmic medication such as amiodarone given her history of ILD.  Acute kidney injury: Resolved.  Multifocal pneumonia with strep pneumo and bacteremia: Management per primary team  Hypertension: Resume home medications.  Hyperlipidemia: Continue home medications.  Cardiology will sign off as she remains in sinus rhythm and medical therapy have been transitioned from IV to oral.  Plan of care discussed with nursing staff, patient, daughter over the phone, as well as attending physician.  Please reach out if any questions or concerns arise.  Patient's questions and  concerns were addressed to her satisfaction. She voices understanding of the instructions provided during this encounter.   This note was created using a voice recognition software as a result there may be grammatical errors inadvertently enclosed that do not reflect the nature of this encounter. Every attempt is made to correct such errors.  Delilah Shan [facility]  Pager:  [phone] Office: [phone] 10/13/2022, 7:35 PM

## 2022-10-13 NOTE — Progress Notes (Signed)
ANTICOAGULATION CONSULT NOTE - Follow Up  Pharmacy Consult for Heparin > Eliquis Indication: atrial fibrillation  Allergies  Allergen Reactions   Ciprofloxacin Itching, Swelling and Other (See Comments)    Possibly causing tremors?   Lisinopril Cough   Crestor [Rosuvastatin] Other (See Comments)    Myalgia and back pain   Ofev [Nintedanib] Nausea Only    Patient Measurements: Height: 5\' 7"  (170.2 cm) Weight: 63.8 kg (140 lb 10.5 oz) IBW/kg (Calculated) : 61.6 Heparin Dosing Weight: 63.5 kg  Vital Signs: Temp: 98.9 F (37.2 C) (09/17 1100) Temp Source: Oral (09/17 1100) BP: 137/77 (09/17 1100) Pulse Rate: 76 (09/17 1100)  Labs: Recent Labs    10/11/22 1431 10/11/22 1608 10/12/22 0021 10/12/22 0215 10/12/22 1018 10/12/22 2115 10/13/22 0212  HGB 11.8*  --   --   --   --   --  9.5*  HCT 36.2  --   --   --   --   --  28.3*  PLT 212  --   --   --   --   --  208  HEPARINUNFRC  --   --    < >  --  0.27* 0.37 0.43  CREATININE 1.95*  --   --  1.19*  --   --  0.97  TROPONINIHS 14 15  --   --   --   --   --    < > = values in this interval not displayed.    Estimated Creatinine Clearance: 45 mL/min (by C-G formula based on SCr of 0.97 mg/dL).   Medical History: Past Medical History:  Diagnosis Date   Acute bronchitis 12/17/2021   Acute cystitis 02/20/2019   Breast cancer (HCC)    Diabetes mellitus without complication (HCC)    Hypertension    Hypothyroidism    Lupus (HCC)    Pneumonia 07/31/2021    Assessment: 82 yoF admitted with AFL. Pharmacy to dose heparin. Pt is not on AC PTA.   Heparin level subtherapeutic at 0.43, CBC stable.   PM: Pharmacy consulted to transition to Eliquis. Meets criteria for standard dosing since weight > 60kg, SCr < 1.5  Goal of Therapy:  Heparin level 0.3-0.7 units/ml Monitor platelets by anticoagulation protocol: Yes   Plan:  At 18:00, dc IV heparin and begin Eliquis 5mg  PO BID Provide education prior to discharge Co-pay  $0.00  Loralee Pacas, PharmD, BCPS 10/13/2022 2:53 PM  Please check AMION for all [facility] phone numbers After 10:00 PM, call Main Pharmacy [phone]

## 2022-10-13 NOTE — Plan of Care (Signed)
  Problem: Activity: Goal: Ability to tolerate increased activity will improve Outcome: Progressing   Problem: Respiratory: Goal: Ability to maintain adequate ventilation will improve Outcome: Progressing Goal: Ability to maintain a clear airway will improve Outcome: Progressing   Problem: Coping: Goal: Ability to adjust to condition or change in health will improve Outcome: Progressing   Problem: Nutritional: Goal: Maintenance of adequate nutrition will improve Outcome: Progressing Goal: Progress toward achieving an optimal weight will improve Outcome: Progressing   Problem: Skin Integrity: Goal: Risk for impaired skin integrity will decrease Outcome: Progressing

## 2022-10-13 NOTE — TOC Initial Note (Signed)
Transition of Care [facility]) - Initial/Assessment Note    Patient Details  Name: [name] MRN: [id number] Date of Birth: 02/10/1940  [facility]) CM/SW Contact:    Leone Haven, RN Phone Number: 10/13/2022, 5:33 PM  Clinical Narrative:                 From home with daughter, has PCP and insurance on file, states has no HH services in place at this time , she has a cane at home.  States daughter will transport her home at [employer] and she  is support system, states gets medications from [employer], they deliver to her.Sandria Bales  ambulatory with cane.  Expected Discharge Plan: Home/Self Care Barriers to Discharge: Continued Medical Work up   Patient Goals and CMS Choice Patient states their goals for this hospitalization and ongoing recovery are:: return home   Choice offered to / list presented to : NA      Expected Discharge Plan and Services In-house Referral: NA Discharge Planning Services: CM Consult Post Acute Care Choice: NA Living arrangements for the past 2 months: Single Family Home                 DME Arranged: N/A DME Agency: NA       HH Arranged: NA          Prior Living Arrangements/Services Living arrangements for the past 2 months: Single Family Home Lives with:: Adult Children (daughter) Patient language and need for interpreter reviewed:: Yes Do you feel safe going back to the place where you live?: Yes      Need for Family Participation in Patient Care: Yes (Comment) Care giver support system in place?: Yes (comment) Current home services: DME (cane) Criminal Activity/Legal Involvement Pertinent to Current Situation/Hospitalization: No - Comment as needed  Activities of Daily Living Home Assistive Devices/Equipment: Cane (specify quad or straight) (Pateint has both) ADL Screening (condition at time of admission) Patient's cognitive ability adequate to safely complete daily activities?: Yes Is the patient deaf or have  difficulty hearing?: No Does the patient have difficulty seeing, even when wearing glasses/contacts?: No Does the patient have difficulty concentrating, remembering, or making decisions?: No Patient able to express need for assistance with ADLs?: Yes Does the patient have difficulty dressing or bathing?: No Independently performs ADLs?: Yes (appropriate for developmental age) Does the patient have difficulty walking or climbing stairs?: Yes Weakness of Legs: Both Weakness of Arms/Hands: None  Permission Sought/Granted Permission sought to share information with : Case Manager Permission granted to share information with : Yes, Verbal Permission Granted              Emotional Assessment   Attitude/Demeanor/Rapport: Engaged Affect (typically observed): Appropriate Orientation: : Oriented to Self, Oriented to Place, Oriented to  Time, Oriented to Situation Alcohol / Substance Use: Not Applicable Psych Involvement: No (comment)  Admission diagnosis:  Hyponatremia [E87.1] SIRS (systemic inflammatory response syndrome) (HCC) [R65.10] Atrial flutter with rapid ventricular response (HCC) [I48.92] AKI (acute kidney injury) (HCC) [N17.9] Sepsis due to pneumonia (HCC) [J18.9, A41.9] Pneumonia of both lower lobes due to infectious organism [J18.9] Patient Active Problem List   Diagnosis Date Noted   Sepsis due to pneumonia (HCC) 10/11/2022   AKI (acute kidney injury) (HCC) 10/11/2022   Paroxysmal atrial flutter (HCC) 10/11/2022   Diarrhea 10/11/2022   Controlled type 2 diabetes mellitus without complication, without long-term current use of insulin (HCC) 10/11/2022   Mixed hyperlipidemia 10/11/2022   Atherosclerosis  of aorta (HCC) 10/11/2022   Diabetes mellitus type 2, noninsulin dependent (HCC) 10/11/2022   Benign hypertension 10/11/2022   Acute febrile illness 10/11/2022   Atrial flutter with rapid ventricular response (HCC) 10/11/2022   Chronic diastolic CHF (congestive heart  failure) (HCC) 10/11/2022   Syncope 04/25/2022   Near syncope 02/18/2019   Hyponatremia 02/18/2019   ILD (interstitial lung disease) (HCC) 07/30/2016   Dyspnea 07/16/2016   Bibasilar crackles 07/16/2016   History of systemic lupus erythematosus (SLE) (HCC) 07/16/2016   Cough 07/16/2016   Encounter for monitoring ACE-inhibitor therapy 07/16/2016   History of asthma 07/16/2016   Hypothyroidism 01/03/2015   Primary hypertension 01/03/2015   UTI (urinary tract infection) 01/03/2015   Drug-induced hypersensitivity reaction 01/03/2015   Lupus (HCC) 01/03/2015   Pyrexia 01/02/2015   PCP:  Merri Brunette, MD Pharmacy:   [facility] - [place], [place] - [address] [place] [address] Phone: [phone] Fax: [phone]  [facility] - [place] ([place]), [place] - [address] [place]) [place] [address] Phone: [phone] Fax: [phone]  [facility] - [place], [place] - [address] [facility] [employer] [address] [place] [address] Phone: [phone] Fax: [phone]  [facility] - [place], [place] - [address] [place] [address] Phone: [phone] Fax: [phone]  [facility] [address] [place] [address] Phone: [phone] Fax: [phone]  [facility] - [place], [place] - [address] [place] [address] Phone: [phone] Fax: [phone]     Social Determinants of Health (SDOH) Social History: SDOH Screenings   Food Insecurity: No Food Insecurity (10/12/2022)  Housing: Low Risk  (10/12/2022)  Transportation Needs: No Transportation Needs (10/12/2022)  Utilities: At Risk (10/12/2022)  Tobacco Use: Medium Risk (10/11/2022)   SDOH Interventions:     Readmission Risk Interventions     No data to display

## 2022-10-13 NOTE — Progress Notes (Signed)
ANTICOAGULATION CONSULT NOTE - Follow Up  Pharmacy Consult for Heparin Indication: atrial fibrillation  Allergies  Allergen Reactions   Ciprofloxacin Itching, Swelling and Other (See Comments)    Possibly causing tremors?   Lisinopril Cough   Crestor [Rosuvastatin] Other (See Comments)    Myalgia and back pain   Ofev [Nintedanib] Nausea Only    Patient Measurements: Height: 5\' 7"  (170.2 cm) Weight: 63.8 kg (140 lb 10.5 oz) IBW/kg (Calculated) : 61.6 Heparin Dosing Weight: 63.5 kg  Vital Signs: Temp: 98.7 F (37.1 C) (09/17 0812) Temp Source: Oral (09/17 0812) BP: 136/87 (09/17 0812) Pulse Rate: 109 (09/17 0812)  Labs: Recent Labs    10/11/22 1431 10/11/22 1608 10/12/22 0021 10/12/22 0215 10/12/22 1018 10/12/22 2115 10/13/22 0212  HGB 11.8*  --   --   --   --   --  9.5*  HCT 36.2  --   --   --   --   --  28.3*  PLT 212  --   --   --   --   --  208  HEPARINUNFRC  --   --    < >  --  0.27* 0.37 0.43  CREATININE 1.95*  --   --  1.19*  --   --  0.97  TROPONINIHS 14 15  --   --   --   --   --    < > = values in this interval not displayed.    Estimated Creatinine Clearance: 45 mL/min (by C-G formula based on SCr of 0.97 mg/dL).   Medical History: Past Medical History:  Diagnosis Date   Acute bronchitis 12/17/2021   Acute cystitis 02/20/2019   Breast cancer (HCC)    Diabetes mellitus without complication (HCC)    Hypertension    Hypothyroidism    Lupus (HCC)    Pneumonia 07/31/2021    Assessment: 82 yoF admitted with AFL. Pharmacy to dose heparin. Pt is not on AC PTA.   Heparin level subtherapeutic at 0.43, CBC stable.   Goal of Therapy:  Heparin level 0.3-0.7 units/ml Monitor platelets by anticoagulation protocol: Yes   Plan:  Continue heparin 1250 units/h Daily heparin level and CBC   Fredonia Highland, PharmD, [place], [place] [facility] Clinical Pharmacist [phone] Please check AMION for all [facility] numbers 10/13/2022

## 2022-10-14 DIAGNOSIS — A419 Sepsis, unspecified organism: Secondary | ICD-10-CM | POA: Diagnosis not present

## 2022-10-14 DIAGNOSIS — J189 Pneumonia, unspecified organism: Secondary | ICD-10-CM | POA: Diagnosis not present

## 2022-10-14 LAB — GLUCOSE, CAPILLARY
Glucose-Capillary: 114 mg/dL — ABNORMAL HIGH (ref 70–99)
Glucose-Capillary: 127 mg/dL — ABNORMAL HIGH (ref 70–99)
Glucose-Capillary: 122 mg/dL — ABNORMAL HIGH (ref 70–99)
Glucose-Capillary: 116 mg/dL — ABNORMAL HIGH (ref 70–99)

## 2022-10-14 LAB — CULTURE, BLOOD (ROUTINE X 2)

## 2022-10-14 MED ORDER — AMLODIPINE BESYLATE 5 MG PO TABS
5.0000 mg | ORAL_TABLET | Freq: Every day | ORAL | Status: DC
  Administered 2022-10-14 – 2022-10-16 (×3): 5 mg via ORAL
  Filled 2022-10-14 (×3): qty 1

## 2022-10-14 MED ORDER — SODIUM CHLORIDE 0.9 % IV SOLN: 2.0000 g | INTRAVENOUS | Status: DC

## 2022-10-14 NOTE — Care Management Important Message (Signed)
Important Message  Patient Details  Name: [name] MRN: [id number] Date of Birth: 05-22-1940   Medicare Important Message Given:  Yes     Dorena Bodo 10/14/2022, 12:48 PM

## 2022-10-14 NOTE — Plan of Care (Signed)

## 2022-10-14 NOTE — Plan of Care (Signed)
  Problem: Activity: Goal: Ability to tolerate increased activity will improve Outcome: Progressing   Problem: Clinical Measurements: Goal: Ability to maintain a body temperature in the normal range will improve Outcome: Progressing   Problem: Respiratory: Goal: Ability to maintain adequate ventilation will improve Outcome: Progressing Goal: Ability to maintain a clear airway will improve Outcome: Progressing   Problem: Education: Goal: Knowledge of General Education information will improve Description: Including pain rating scale, medication(s)/side effects and non-pharmacologic comfort measures Outcome: Progressing   Problem: Health Behavior/Discharge Planning: Goal: Ability to manage health-related needs will improve Outcome: Progressing   Problem: Clinical Measurements: Goal: Ability to maintain clinical measurements within normal limits will improve Outcome: Progressing Goal: Will remain free from infection Outcome: Progressing Goal: Diagnostic test results will improve Outcome: Progressing Goal: Respiratory complications will improve Outcome: Progressing Goal: Cardiovascular complication will be avoided Outcome: Progressing   Problem: Activity: Goal: Risk for activity intolerance will decrease Outcome: Progressing   Problem: Nutrition: Goal: Adequate nutrition will be maintained Outcome: Progressing   Problem: Coping: Goal: Level of anxiety will decrease Outcome: Progressing   Problem: Elimination: Goal: Will not experience complications related to bowel motility Outcome: Progressing Goal: Will not experience complications related to urinary retention Outcome: Progressing   Problem: Pain Managment: Goal: General experience of comfort will improve Outcome: Progressing   Problem: Safety: Goal: Ability to remain free from injury will improve Outcome: Progressing

## 2022-10-14 NOTE — TOC Progression Note (Signed)
Transition of Care [facility]) - Progression Note    Patient Details  Name: [name] MRN: [id number] Date of Birth: Apr 17, 1940  Transition of Care [facility]) CM/SW Contact  Leone Haven, RN Phone Number: 10/14/2022, 11:31 AM  Clinical Narrative:    Patient continue on iv abxs, TOC conts to follow.    Expected Discharge Plan: Home/Self Care Barriers to Discharge: Continued Medical Work up  Expected Discharge Plan and Services In-house Referral: NA Discharge Planning Services: CM Consult Post Acute Care Choice: NA Living arrangements for the past 2 months: Single Family Home                 DME Arranged: N/A DME Agency: NA       HH Arranged: NA           Social Determinants of Health (SDOH) Interventions SDOH Screenings   Food Insecurity: No Food Insecurity (10/12/2022)  Housing: Low Risk  (10/12/2022)  Transportation Needs: No Transportation Needs (10/12/2022)  Utilities: At Risk (10/12/2022)  Tobacco Use: Medium Risk (10/11/2022)    Readmission Risk Interventions     No data to display

## 2022-10-14 NOTE — Progress Notes (Signed)
Second reading. Made nurse aware, pt stated that she was in pain, but didn't want to keep taking too much pain meds.

## 2022-10-14 NOTE — Progress Notes (Signed)
PROGRESS NOTE    [name]  WUJ:[id number] DOB: 1940/06/17 DOA: 10/11/2022 PCP: Merri Brunette, MD   Brief Narrative: 82 year old with past medical history significant for hypertension, CAD, a flutter, interstitial lung disease, diabetes type 2, CKD stage IIIa, S LEE, hypothyroidism, remote history of breast cancer status postlumpectomy who presents complaining of cough, shortness of breath, fever for 1 week.  She was noted to be tachycardic heart rate in the 180s while she was evaluated by EMS.  She received IV fluids, IV diltiazem.  In the ED heart rate continue to be in the 140s, found to have lactic acidosis, increased BNP, chest x-ray consistent with interstitial lung disease, CT angio chest, negative for PE, finding consistent with multifocal pneumonia.  Patient admitted with multifocal pneumonia, strep pneumonia bacteremia, paroxysmal  A flutter.  Evaluated by cardiology.  She was initially on Cardizem drip subsequently transition to oral Cardizem.  Started on Eliquis.     Assessment & Plan:   Principal Problem:   Sepsis due to pneumonia [facility]) Active Problems:   UTI (urinary tract infection)   Paroxysmal atrial flutter (HCC)   Hyponatremia   AKI (acute kidney injury) (HCC)   Diarrhea   Diabetes mellitus type 2, noninsulin dependent (HCC)   Chronic diastolic CHF (congestive heart failure) (HCC)   Hypothyroidism   History of systemic lupus erythematosus (SLE) (HCC)   ILD (interstitial lung disease) (HCC)   Controlled type 2 diabetes mellitus without complication, without long-term current use of insulin (HCC)   Mixed hyperlipidemia   Benign hypertension   Acute febrile illness   Long term (current) use of anticoagulants    1-Sepsis severe secondary to multifocal pneumonia,  -Strep pneumonia bacteremia -Patient presents with tachypnea, tachycardia, leukocytosis, lactic acidosis.  CTA chest consistent with multifocal pneumonia -Blood Cultures positive for strep  pneumoniae Urine strep pneumoniae positive.  Legionella antigen negative. -White blood cell trending down. -Continue with IV ceftriaxone.  She will need to complete 7 to 10 days of antibiotics  2-UTI: Getting IV ceftriaxone  Paroxysmal atrial flutter On Cardizem to oral Cardizem and Heparin drip was transitioned to Eliquis Cardiology assistance  Elevated D-dimer: CTA chest negative for PE  AKI on CKD stage III A Improved with IV fluids Containing peaked to 1.9  Hypnatremia: Improved with IV fluids.  Chronic Diastolic heart failure: compensated.   Diabetes type 2 with hyperglycemia: Continue with sliding scale insulin  Interstitial. lung disease, lupus Holding Plaquenil in the setting of infection  Hypothyroidism: Continue with Synthroid  Stable calcified mass in the medial left breast measuring up to 4.6 cm. By CT chest./  -needs follow up with PCP/oncology.     Estimated body mass index is 23.13 kg/m as calculated from the following:   Height as of this encounter: 5\' 7"  (1.702 m).   Weight as of this encounter: 67 kg.   DVT prophylaxis: eliquis Code Status: Full code Family Communication: no family at bedside Disposition Plan:  Status is: Inpatient Remains inpatient appropriate because: management of bacteremia    Consultants:  Cardiology   Procedures:    Antimicrobials:    Subjective: She is sleepy, couldn't sleep last night. She denies worsening dyspnea. Cough improved.    Objective: Vitals:   10/14/22 0407 10/14/22 0419 10/14/22 0754 10/14/22 1101  BP: (!) 144/68   136/86  Pulse: 72  72 66  Resp: 17   (!) 21  Temp: 99.2 F (37.3 C)  98.9 F (37.2 C) 98.6 F (37 C)  TempSrc: Oral  Oral Oral  SpO2: 96%   91%  Weight:  67 kg    Height:        Intake/Output Summary (Last 24 hours) at 10/14/2022 1411 Last data filed at 10/13/2022 1530 Gross per 24 hour  Intake 104.34 ml  Output --  Net 104.34 ml   Filed Weights   10/12/22 0504 10/13/22  0550 10/14/22 0419  Weight: 67 kg 63.8 kg 67 kg    Examination:  General exam: Appears calm and comfortable  Respiratory system: Clear to auscultation. Respiratory effort normal. Cardiovascular system: S1 & S2 heard, RRR. Gastrointestinal system: Abdomen is nondistended, soft and nontender. No organomegaly or masses felt. Normal bowel sounds heard. Central nervous system: Alert and oriented. Extremities: Symmetric 5 x 5 power.   Data Reviewed: I have personally reviewed following labs and imaging studies  CBC: Recent Labs  Lab 10/11/22 1431 10/13/22 0212 10/14/22 0227  WBC 16.5* 17.3* 10.0  NEUTROABS  --  15.6* 7.1  HGB 11.8* 9.5* 9.6*  HCT 36.2 28.3* 28.6*  MCV 88.9 86.8 87.5  PLT 212 208 197   Basic Metabolic Panel: Recent Labs  Lab 10/11/22 1431 10/12/22 0215 10/13/22 0212 10/14/22 0227  NA 126* 130* 131* 134*  K 3.8 4.1 3.7 3.5  CL 97* 105 105 104  CO2 19* 18* 16* 19*  GLUCOSE 117* 163* 168* 121*  BUN 33* 23 17 12   CREATININE 1.95* 1.19* 0.97 0.97  CALCIUM 8.5* 8.3* 8.4* 8.8*  MG 1.7 2.2  --   --    GFR: Estimated Creatinine Clearance: 45 mL/min (by C-G formula based on SCr of 0.97 mg/dL). Liver Function Tests: Recent Labs  Lab 10/11/22 1431  AST 25  ALT 11  ALKPHOS 60  BILITOT 1.0  PROT 8.1  ALBUMIN 2.9*   No results for input(s): "LIPASE", "AMYLASE" in the last 168 hours. No results for input(s): "AMMONIA" in the last 168 hours. Coagulation Profile: No results for input(s): "INR", "PROTIME" in the last 168 hours. Cardiac Enzymes: No results for input(s): "CKTOTAL", "CKMB", "CKMBINDEX", "TROPONINI" in the last 168 hours. BNP (last 3 results) Recent Labs    03/26/22 1525 06/01/22 1518  PROBNP 32.0 97.0   HbA1C: No results for input(s): "HGBA1C" in the last 72 hours. CBG: Recent Labs  Lab 10/13/22 1104 10/13/22 1623 10/13/22 2125 10/14/22 0626 10/14/22 1055  GLUCAP 160* 124* 143* 114* 127*   Lipid Profile: No results for  input(s): "CHOL", "HDL", "LDLCALC", "TRIG", "CHOLHDL", "LDLDIRECT" in the last 72 hours. Thyroid Function Tests: Recent Labs    10/11/22 1608  TSH 4.162   Anemia Panel: No results for input(s): "VITAMINB12", "FOLATE", "FERRITIN", "TIBC", "IRON", "RETICCTPCT" in the last 72 hours. Sepsis Labs: Recent Labs  Lab 10/11/22 1431 10/11/22 1450 10/11/22 1625  PROCALCITON 7.05  --   --   LATICACIDVEN  --  2.5* 1.7    Recent Results (from the past 240 hour(s))  SARS CORONAVIRUS 2 (TAT 6-24 HRS) Anterior Nasal Swab     Status: None   Collection Time: 10/04/22  3:02 PM   Specimen: Anterior Nasal Swab  Result Value Ref Range Status   SARS Coronavirus 2 NEGATIVE NEGATIVE Final    Comment: (NOTE) SARS-CoV-2 target nucleic acids are NOT DETECTED.  The SARS-CoV-2 RNA is generally detectable in upper and lower respiratory specimens during the acute phase of infection. Negative results do not preclude SARS-CoV-2 infection, do not rule out co-infections with other pathogens, and should not be used as the sole basis for treatment  or other patient management decisions. Negative results must be combined with clinical observations, patient history, and epidemiological information. The expected result is Negative.  Fact Sheet for Patients: HairSlick.no  Fact Sheet for Healthcare Providers: quierodirigir.com  This test is not yet approved or cleared by the [place] FDA and  has been authorized for detection and/or diagnosis of SARS-CoV-2 by FDA under an Emergency Use Authorization (EUA). This EUA will remain  in effect (meaning this test can be used) for the duration of the COVID-19 declaration under Se ction 564(b)(1) of the Act, 21 U.S.C. section 360bbb-3(b)(1), unless the authorization is terminated or revoked sooner.  Performed at [facility], [address], [place], [place] [address]   Blood culture (routine x 2)      Status: Abnormal   Collection Time: 10/11/22  2:35 PM   Specimen: BLOOD RIGHT FOREARM  Result Value Ref Range Status   Specimen Description BLOOD RIGHT FOREARM  Final   Special Requests   Final    BOTTLES DRAWN AEROBIC AND ANAEROBIC Blood Culture results may not be optimal due to an excessive volume of blood received in culture bottles   Culture  Setup Time   Final    GRAM POSITIVE COCCI IN PAIRS AEROBIC BOTTLE ONLY CRITICAL RESULT CALLED TO, READ BACK BY AND VERIFIED WITH: PHARMD B AGEE [id number] AT 1021 AM BY CM Performed at [facility], [address] [place], [place] [address]    Culture STREPTOCOCCUS PNEUMONIAE (A)  Final   Report Status 10/14/2022 FINAL  Final   Organism ID, Bacteria STREPTOCOCCUS PNEUMONIAE  Final      Susceptibility   Streptococcus pneumoniae - MIC*    ERYTHROMYCIN <=0.12 SENSITIVE Sensitive     LEVOFLOXACIN 1 SENSITIVE Sensitive     VANCOMYCIN 0.5 SENSITIVE Sensitive     PENICILLIN (meningitis) <=0.06 SENSITIVE Sensitive     PENO - penicillin <=0.06      PENICILLIN (non-meningitis) <=0.06 SENSITIVE Sensitive     PENICILLIN (oral) <=0.06 SENSITIVE Sensitive     CEFTRIAXONE (non-meningitis) <=0.12 SENSITIVE Sensitive     CEFTRIAXONE (meningitis) <=0.12 SENSITIVE Sensitive     * STREPTOCOCCUS PNEUMONIAE  Blood Culture ID Panel (Reflexed)     Status: Abnormal   Collection Time: 10/11/22  2:35 PM  Result Value Ref Range Status   Enterococcus faecalis NOT DETECTED NOT DETECTED Final   Enterococcus Faecium NOT DETECTED NOT DETECTED Final   Listeria monocytogenes NOT DETECTED NOT DETECTED Final   Staphylococcus species NOT DETECTED NOT DETECTED Final   Staphylococcus aureus (BCID) NOT DETECTED NOT DETECTED Final   Staphylococcus epidermidis NOT DETECTED NOT DETECTED Final   Staphylococcus lugdunensis NOT DETECTED NOT DETECTED Final   Streptococcus species DETECTED (A) NOT DETECTED Final    Comment: CRITICAL RESULT CALLED TO, READ BACK BY AND VERIFIED  WITH: PHARMD B AGEE [id number] AR 1021 AM BY CM    Streptococcus agalactiae NOT DETECTED NOT DETECTED Final   Streptococcus pneumoniae DETECTED (A) NOT DETECTED Final    Comment: CRITICAL RESULT CALLED TO, READ BACK BY AND VERIFIED WITH: PHARMD B AGEE [id number] AT 1021 AM BY CM    Streptococcus pyogenes NOT DETECTED NOT DETECTED Final   A.calcoaceticus-baumannii NOT DETECTED NOT DETECTED Final   Bacteroides fragilis NOT DETECTED NOT DETECTED Final   Enterobacterales NOT DETECTED NOT DETECTED Final   Enterobacter cloacae complex NOT DETECTED NOT DETECTED Final   Escherichia coli NOT DETECTED NOT DETECTED Final   Klebsiella aerogenes NOT DETECTED NOT DETECTED Final  Klebsiella oxytoca NOT DETECTED NOT DETECTED Final   Klebsiella pneumoniae NOT DETECTED NOT DETECTED Final   Proteus species NOT DETECTED NOT DETECTED Final   Salmonella species NOT DETECTED NOT DETECTED Final   Serratia marcescens NOT DETECTED NOT DETECTED Final   Haemophilus influenzae NOT DETECTED NOT DETECTED Final   Neisseria meningitidis NOT DETECTED NOT DETECTED Final   Pseudomonas aeruginosa NOT DETECTED NOT DETECTED Final   Stenotrophomonas maltophilia NOT DETECTED NOT DETECTED Final   Candida albicans NOT DETECTED NOT DETECTED Final   Candida auris NOT DETECTED NOT DETECTED Final   Candida glabrata NOT DETECTED NOT DETECTED Final   Candida krusei NOT DETECTED NOT DETECTED Final   Candida parapsilosis NOT DETECTED NOT DETECTED Final   Candida tropicalis NOT DETECTED NOT DETECTED Final   Cryptococcus neoformans/gattii NOT DETECTED NOT DETECTED Final    Comment: Performed at [facility], [address] [place], [place] [address]  Resp panel by RT-PCR (RSV, Flu A&B, Covid) Anterior Nasal Swab     Status: None   Collection Time: 10/11/22  2:36 PM   Specimen: Anterior Nasal Swab  Result Value Ref Range Status   SARS Coronavirus 2 by RT PCR NEGATIVE NEGATIVE Final   Influenza A by PCR NEGATIVE NEGATIVE Final    Influenza B by PCR NEGATIVE NEGATIVE Final    Comment: (NOTE) The Xpert Xpress SARS-CoV-2/FLU/RSV plus assay is intended as an aid in the diagnosis of influenza from Nasopharyngeal swab specimens and should not be used as a sole basis for treatment. Nasal washings and aspirates are unacceptable for Xpert Xpress SARS-CoV-2/FLU/RSV testing.  Fact Sheet for Patients: BloggerCourse.com  Fact Sheet for Healthcare Providers: SeriousBroker.it  This test is not yet approved or cleared by the [place] FDA and has been authorized for detection and/or diagnosis of SARS-CoV-2 by FDA under an Emergency Use Authorization (EUA). This EUA will remain in effect (meaning this test can be used) for the duration of the COVID-19 declaration under Section 564(b)(1) of the Act, 21 U.S.C. section 360bbb-3(b)(1), unless the authorization is terminated or revoked.     Resp Syncytial Virus by PCR NEGATIVE NEGATIVE Final    Comment: (NOTE) Fact Sheet for Patients: BloggerCourse.com  Fact Sheet for Healthcare Providers: SeriousBroker.it  This test is not yet approved or cleared by the [place] FDA and has been authorized for detection and/or diagnosis of SARS-CoV-2 by FDA under an Emergency Use Authorization (EUA). This EUA will remain in effect (meaning this test can be used) for the duration of the COVID-19 declaration under Section 564(b)(1) of the Act, 21 U.S.C. section 360bbb-3(b)(1), unless the authorization is terminated or revoked.  Performed at [facility], [address], [place], [place] [address]   Blood culture (routine x 2)     Status: None (Preliminary result)   Collection Time: 10/11/22  2:40 PM   Specimen: BLOOD LEFT HAND  Result Value Ref Range Status   Specimen Description BLOOD LEFT HAND  Final   Special Requests   Final    BOTTLES DRAWN AEROBIC AND ANAEROBIC Blood  Culture results may not be optimal due to an inadequate volume of blood received in culture bottles   Culture   Final    NO GROWTH 3 DAYS Performed at [facility], [address], [place], [place] [address]    Report Status PENDING  Incomplete  SARS Coronavirus 2 by RT PCR (hospital order, performed in [facility] hospital lab) *cepheid single result test* Anterior Nasal Swab  Status: None   Collection Time: 10/11/22  3:55 PM   Specimen: Anterior Nasal Swab  Result Value Ref Range Status   SARS Coronavirus 2 by RT PCR NEGATIVE NEGATIVE Final    Comment: Performed at [facility], [address] [place], [place] [address]  Urine Culture     Status: Abnormal   Collection Time: 10/11/22  4:08 PM   Specimen: Urine, Random  Result Value Ref Range Status   Specimen Description URINE, RANDOM  Final   Special Requests   Final    NONE Reflexed from [id number] Performed at [facility], [address] [place], [place] [address]    Culture MULTIPLE SPECIES PRESENT, SUGGEST RECOLLECTION (A)  Final   Report Status 10/13/2022 FINAL  Final  MRSA Next Gen by PCR, Nasal     Status: None   Collection Time: 10/11/22  4:37 PM   Specimen: Nasal Mucosa; Nasal Swab  Result Value Ref Range Status   MRSA by PCR Next Gen NOT DETECTED NOT DETECTED Final    Comment: (NOTE) The GeneXpert MRSA Assay (FDA approved for NASAL specimens only), is one component of a comprehensive MRSA colonization surveillance program. It is not intended to diagnose MRSA infection nor to guide or monitor treatment for MRSA infections. Test performance is not FDA approved in patients less than 85 years old. Performed at [facility], [address] [place], [place] [address]          Radiology Studies: CT Angio Chest Pulmonary Embolism (PE) W or WO Contrast  Result Date: 10/12/2022 CLINICAL DATA:  Shortness of breath. Pulmonary embolism (PE) suspected, low to intermediate prob, positive D-dimer  Elevated d-dimer EXAM: CT ANGIOGRAPHY CHEST WITH CONTRAST TECHNIQUE: Multidetector CT imaging of the chest was performed using the standard protocol during bolus administration of intravenous contrast. Multiplanar CT image reconstructions and MIPs were obtained to evaluate the vascular anatomy. RADIATION DOSE REDUCTION: This exam was performed according to the departmental dose-optimization program which includes automated exposure control, adjustment of the mA and/or kV according to patient size and/or use of iterative reconstruction technique. CONTRAST:  75mL OMNIPAQUE IOHEXOL 350 MG/ML SOLN COMPARISON:  CT 04/26/2022, x-ray 10/11/2022 FINDINGS: Cardiovascular: Satisfactory opacification of the pulmonary arteries to the segmental level. No evidence of pulmonary embolism. Thoracic aorta is nonaneurysmal. Atherosclerotic calcification of the aorta and coronary arteries. Mild cardiomegaly. No pericardial effusion. Mediastinum/Nodes: Mildly enlarged mediastinal and right hilar lymph nodes, likely reactive. No axillary lymphadenopathy. Trachea within normal limits. Small hiatal hernia. Lungs/Pleura: Patchy airspace opacities within the bilateral lower lobes, right middle lobe, and right upper lobe. No pleural effusion or pneumothorax. Upper Abdomen: No acute abnormality. Musculoskeletal: Stable calcified mass in the medial left breast measuring up to 4.6 cm. No new or acute bony findings. Review of the MIP images confirms the above findings. IMPRESSION: 1. No evidence of pulmonary embolism. 2. Patchy airspace opacities within the bilateral lower lobes, right middle lobe, and right upper lobe, compatible with multifocal pneumonia. 3. Mildly enlarged mediastinal and right hilar lymph nodes, likely reactive. 4. Aortic and coronary artery atherosclerosis (ICD10-I70.0). 5. Stable calcified mass in the medial left breast measuring up to 4.6 cm. Correlate with prior mammographic workup. Electronically Signed   By: Duanne Guess D.O.   On: 10/12/2022 17:51        Scheduled Meds:  acidophilus  1 capsule Oral BID   apixaban  5 mg Oral BID   dextromethorphan-guaiFENesin  1 tablet Oral BID  diltiazem  60 mg Oral Q12H   gabapentin  100 mg Oral Q8H   insulin aspart  0-5 Units Subcutaneous QHS   insulin aspart  0-9 Units Subcutaneous TID WC   pantoprazole  40 mg Oral Daily   polyethylene glycol  17 g Oral Daily   senna-docusate  1 tablet Oral QHS   sodium chloride flush  3 mL Intravenous Q12H   Continuous Infusions:  cefTRIAXone (ROCEPHIN)  IV 2 g (10/14/22 0830)     LOS: 3 days    Time spent: 35 minutes    Citlalli Weikel A Clearnce Leja, MD [facility]   If 7PM-7AM, please contact night-coverage [email or web address]  10/14/2022, 2:11 PM

## 2022-10-15 DIAGNOSIS — A419 Sepsis, unspecified organism: Secondary | ICD-10-CM | POA: Diagnosis not present

## 2022-10-15 DIAGNOSIS — J189 Pneumonia, unspecified organism: Secondary | ICD-10-CM | POA: Diagnosis not present

## 2022-10-15 LAB — GLUCOSE, CAPILLARY
Glucose-Capillary: 103 mg/dL — ABNORMAL HIGH (ref 70–99)
Glucose-Capillary: 146 mg/dL — ABNORMAL HIGH (ref 70–99)
Glucose-Capillary: 134 mg/dL — ABNORMAL HIGH (ref 70–99)
Glucose-Capillary: 132 mg/dL — ABNORMAL HIGH (ref 70–99)

## 2022-10-15 MED ORDER — DM-GUAIFENESIN ER 30-600 MG PO TB12: 1.0000 | ORAL_TABLET | Freq: Two times a day (BID) | ORAL | 0 refills | Status: DC

## 2022-10-15 MED ORDER — DILTIAZEM HCL 60 MG PO TABS: 60.0000 mg | ORAL_TABLET | Freq: Two times a day (BID) | ORAL | 1 refills | Status: DC

## 2022-10-15 MED ORDER — IPRATROPIUM-ALBUTEROL 0.5-2.5 (3) MG/3ML IN SOLN: 3.0000 mL | Freq: Four times a day (QID) | RESPIRATORY_TRACT | 6 refills | Status: DC | PRN

## 2022-10-15 MED ORDER — PANTOPRAZOLE SODIUM 40 MG PO TBEC: 40.0000 mg | DELAYED_RELEASE_TABLET | Freq: Every day | ORAL | 0 refills | Status: DC

## 2022-10-15 MED ORDER — GABAPENTIN 100 MG PO CAPS: 100.0000 mg | ORAL_CAPSULE | Freq: Three times a day (TID) | ORAL | 0 refills | Status: DC

## 2022-10-15 MED ORDER — SPIRONOLACTONE 12.5 MG HALF TABLET
12.5000 mg | ORAL_TABLET | Freq: Every day | ORAL | Status: DC
  Administered 2022-10-15 – 2022-10-16 (×2): 12.5 mg via ORAL
  Filled 2022-10-15 (×2): qty 1

## 2022-10-15 MED ORDER — APIXABAN 5 MG PO TABS: 5.0000 mg | ORAL_TABLET | Freq: Two times a day (BID) | ORAL | 3 refills | Status: DC

## 2022-10-15 MED ORDER — AMOXICILLIN 500 MG PO CAPS
1000.0000 mg | ORAL_CAPSULE | Freq: Three times a day (TID) | ORAL | Status: DC
  Administered 2022-10-15 – 2022-10-16 (×4): 1000 mg via ORAL
  Filled 2022-10-15 (×4): qty 2

## 2022-10-15 MED ORDER — AMOXICILLIN 500 MG PO CAPS: 1000.0000 mg | ORAL_CAPSULE | Freq: Three times a day (TID) | ORAL | 0 refills | Status: AC

## 2022-10-15 MED ORDER — RISAQUAD PO CAPS: 1.0000 | ORAL_CAPSULE | Freq: Two times a day (BID) | ORAL | 0 refills | Status: DC

## 2022-10-15 NOTE — Progress Notes (Signed)
Mobility Specialist Progress Note:   10/15/22 1439  Mobility  Activity Ambulated with assistance in hallway  Level of Assistance Contact guard assist, steadying assist  Assistive Device Front wheel walker  Distance Ambulated (ft) 150 ft  Activity Response Tolerated well  Mobility Referral Yes  $Mobility charge 1 Mobility  Mobility Specialist Start Time (ACUTE ONLY) 1412  Mobility Specialist Stop Time (ACUTE ONLY) 1432  Mobility Specialist Time Calculation (min) (ACUTE ONLY) 20 min    Pre Mobility: 84 HR,  94% SpO2 During Mobility: 103 HR,  90% SpO2 Post Mobility:  98 HR, 91% SpO2  Received pt in chair having no complaints and agreeable to mobility. Pt was asymptomatic throughout ambulation and returned to room w/o fault. Left in chair w/ call bell in reach and all needs met.   D'Vante Earlene Plater Mobility Specialist Please contact via Special educational needs teacher or Rehab office at [phone]

## 2022-10-15 NOTE — Plan of Care (Signed)

## 2022-10-15 NOTE — Progress Notes (Signed)
SATURATION QUALIFICATIONS: (This note is used to comply with regulatory documentation for home oxygen)  Patient Saturations on Room Air at Rest = 91%  Patient Saturations on Room Air while Ambulating = 77%  Patient Saturations on 2  Liters of oxygen while Ambulating = 88%  Please briefly explain why patient needs home oxygen: Patient symptomatic with SOB/fatigue and hypoxic with ambulation on RA.  Sheran Lawless, PT Acute Rehabilitation Services [phone] 10/15/2022

## 2022-10-15 NOTE — Progress Notes (Signed)
PROGRESS NOTE    [name]  EXB:[id number] DOB: [birth year] DOA: 10/11/2022 PCP: Merri Brunette, MD   Brief Narrative: 82 year old with past medical history significant for hypertension, CAD, a flutter, interstitial lung disease, diabetes type 2, CKD stage IIIa, S LEE, hypothyroidism, remote history of breast cancer status postlumpectomy who presents complaining of cough, shortness of breath, fever for 1 week.  She was noted to be tachycardic heart rate in the 180s while she was evaluated by EMS.  She received IV fluids, IV diltiazem.  In the ED heart rate continue to be in the 140s, found to have lactic acidosis, increased BNP, chest x-ray consistent with interstitial lung disease, CT angio chest, negative for PE, finding consistent with multifocal pneumonia.  Patient admitted with multifocal pneumonia, strep pneumonia bacteremia, paroxysmal  A flutter.  Evaluated by cardiology.  She was initially on Cardizem drip subsequently transition to oral Cardizem.  Started on Eliquis.     Assessment & Plan:   Principal Problem:   Sepsis due to pneumonia [facility]) Active Problems:   UTI (urinary tract infection)   Paroxysmal atrial flutter (HCC)   Hyponatremia   AKI (acute kidney injury) (HCC)   Diarrhea   Diabetes mellitus type 2, noninsulin dependent (HCC)   Chronic diastolic CHF (congestive heart failure) (HCC)   Hypothyroidism   History of systemic lupus erythematosus (SLE) (HCC)   ILD (interstitial lung disease) (HCC)   Controlled type 2 diabetes mellitus without complication, without long-term current use of insulin (HCC)   Mixed hyperlipidemia   Benign hypertension   Acute febrile illness   Long term (current) use of anticoagulants    1-Sepsis severe secondary to multifocal pneumonia,  -Strep pneumonia bacteremia -Patient presents with tachypnea, tachycardia, leukocytosis, lactic acidosis.  CTA chest consistent with multifocal pneumonia -Blood Cultures positive for strep  pneumoniae Urine strep pneumoniae positive.  Legionella antigen negative. -White blood cell trending down. -Transition to Amoxicillin complete 10 days.  Hypoxic on ambulation. Will order home oxygen. She will also benefit from another day to work with PT to practice stairs. She lives on second floor.   2-UTI: Treated IV ceftriaxone  Paroxysmal atrial flutter On Cardizem to oral Cardizem Heparin drip was transitioned to Eliquis Cardiology assistance  Elevated D-dimer: CTA chest negative for PE  AKI on CKD stage III A Improved with IV fluids Creatinine  peaked to 1.9  Hypnatremia: Improved with IV fluids.  Chronic Diastolic heart failure: compensated.  Resume spironolactone.   Diabetes type 2 with hyperglycemia: Continue with sliding scale insulin  Interstitial. lung disease, lupus Holding Plaquenil in the setting of infection  Hypothyroidism: Continue with Synthroid  Stable calcified mass in the medial left breast measuring up to 4.6 cm. By CT chest./  -needs follow up with PCP/oncology.     Estimated body mass index is 23.2 kg/m as calculated from the following:   Height as of this encounter: 5\' 7"  (1.702 m).   Weight as of this encounter: 67.2 kg.   DVT prophylaxis: eliquis Code Status: Full code Family Communication: no family at bedside Disposition Plan:  Status is: Inpatient Remains inpatient appropriate because: management of bacteremia    Consultants:  Cardiology   Procedures:    Antimicrobials:    Subjective: She is alert, conversant. Feels better.  She was hypoxic on ambulation.   Objective: Vitals:   10/15/22 0600 10/15/22 0640 10/15/22 0804 10/15/22 1106  BP:   132/81 137/72  Pulse: 84     Resp: 20     Temp:  99.6 F (37.6 C) 98.6 F (37 C)  TempSrc:   Oral Oral  SpO2: 94%     Weight:  67.2 kg    Height:        Intake/Output Summary (Last 24 hours) at 10/15/2022 1208 Last data filed at 10/15/2022 1109 Gross per 24 hour  Intake  1080 ml  Output --  Net 1080 ml   Filed Weights   10/13/22 0550 10/14/22 0419 10/15/22 0640  Weight: 63.8 kg 67 kg 67.2 kg    Examination:  General exam: NAD Respiratory system: Ronchus on the right.  Cardiovascular system: S 1, S 2 RRR Gastrointestinal system: BS present, soft, nt Central nervous system: alert  Data Reviewed: I have personally reviewed following labs and imaging studies  CBC: Recent Labs  Lab 10/11/22 1431 10/13/22 0212 10/14/22 0227  WBC 16.5* 17.3* 10.0  NEUTROABS  --  15.6* 7.1  HGB 11.8* 9.5* 9.6*  HCT 36.2 28.3* 28.6*  MCV 88.9 86.8 87.5  PLT 212 208 197   Basic Metabolic Panel: Recent Labs  Lab 10/11/22 1431 10/12/22 0215 10/13/22 0212 10/14/22 0227  NA 126* 130* 131* 134*  K 3.8 4.1 3.7 3.5  CL 97* 105 105 104  CO2 19* 18* 16* 19*  GLUCOSE 117* 163* 168* 121*  BUN 33* 23 17 12   CREATININE 1.95* 1.19* 0.97 0.97  CALCIUM 8.5* 8.3* 8.4* 8.8*  MG 1.7 2.2  --   --    GFR: Estimated Creatinine Clearance: 45 mL/min (by C-G formula based on SCr of 0.97 mg/dL). Liver Function Tests: Recent Labs  Lab 10/11/22 1431  AST 25  ALT 11  ALKPHOS 60  BILITOT 1.0  PROT 8.1  ALBUMIN 2.9*   No results for input(s): "LIPASE", "AMYLASE" in the last 168 hours. No results for input(s): "AMMONIA" in the last 168 hours. Coagulation Profile: No results for input(s): "INR", "PROTIME" in the last 168 hours. Cardiac Enzymes: No results for input(s): "CKTOTAL", "CKMB", "CKMBINDEX", "TROPONINI" in the last 168 hours. BNP (last 3 results) Recent Labs    03/26/22 1525 06/01/22 1518  PROBNP 32.0 97.0   HbA1C: No results for input(s): "HGBA1C" in the last 72 hours. CBG: Recent Labs  Lab 10/14/22 1055 10/14/22 1614 10/14/22 2114 10/15/22 0633 10/15/22 1109  GLUCAP 127* 122* 116* 103* 146*   Lipid Profile: No results for input(s): "CHOL", "HDL", "LDLCALC", "TRIG", "CHOLHDL", "LDLDIRECT" in the last 72 hours. Thyroid Function Tests: No  results for input(s): "TSH", "T4TOTAL", "FREET4", "T3FREE", "THYROIDAB" in the last 72 hours.  Anemia Panel: No results for input(s): "VITAMINB12", "FOLATE", "FERRITIN", "TIBC", "IRON", "RETICCTPCT" in the last 72 hours. Sepsis Labs: Recent Labs  Lab 10/11/22 1431 10/11/22 1450 10/11/22 1625  PROCALCITON 7.05  --   --   LATICACIDVEN  --  2.5* 1.7    Recent Results (from the past 240 hour(s))  Blood culture (routine x 2)     Status: Abnormal   Collection Time: 10/11/22  2:35 PM   Specimen: BLOOD RIGHT FOREARM  Result Value Ref Range Status   Specimen Description BLOOD RIGHT FOREARM  Final   Special Requests   Final    BOTTLES DRAWN AEROBIC AND ANAEROBIC Blood Culture results may not be optimal due to an excessive volume of blood received in culture bottles   Culture  Setup Time   Final    GRAM POSITIVE COCCI IN PAIRS AEROBIC BOTTLE ONLY CRITICAL RESULT CALLED TO, READ BACK BY AND VERIFIED WITH: PHARMD B AGEE [id number] AT 1021 AM  BY CM Performed at [facility], [address] [place], [place] [address]    Culture STREPTOCOCCUS PNEUMONIAE (A)  Final   Report Status 10/14/2022 FINAL  Final   Organism ID, Bacteria STREPTOCOCCUS PNEUMONIAE  Final      Susceptibility   Streptococcus pneumoniae - MIC*    ERYTHROMYCIN <=0.12 SENSITIVE Sensitive     LEVOFLOXACIN 1 SENSITIVE Sensitive     VANCOMYCIN 0.5 SENSITIVE Sensitive     PENICILLIN (meningitis) <=0.06 SENSITIVE Sensitive     PENO - penicillin <=0.06      PENICILLIN (non-meningitis) <=0.06 SENSITIVE Sensitive     PENICILLIN (oral) <=0.06 SENSITIVE Sensitive     CEFTRIAXONE (non-meningitis) <=0.12 SENSITIVE Sensitive     CEFTRIAXONE (meningitis) <=0.12 SENSITIVE Sensitive     * STREPTOCOCCUS PNEUMONIAE  Blood Culture ID Panel (Reflexed)     Status: Abnormal   Collection Time: 10/11/22  2:35 PM  Result Value Ref Range Status   Enterococcus faecalis NOT DETECTED NOT DETECTED Final   Enterococcus Faecium NOT DETECTED NOT  DETECTED Final   Listeria monocytogenes NOT DETECTED NOT DETECTED Final   Staphylococcus species NOT DETECTED NOT DETECTED Final   Staphylococcus aureus (BCID) NOT DETECTED NOT DETECTED Final   Staphylococcus epidermidis NOT DETECTED NOT DETECTED Final   Staphylococcus lugdunensis NOT DETECTED NOT DETECTED Final   Streptococcus species DETECTED (A) NOT DETECTED Final    Comment: CRITICAL RESULT CALLED TO, READ BACK BY AND VERIFIED WITH: PHARMD B AGEE [id number] AR 1021 AM BY CM    Streptococcus agalactiae NOT DETECTED NOT DETECTED Final   Streptococcus pneumoniae DETECTED (A) NOT DETECTED Final    Comment: CRITICAL RESULT CALLED TO, READ BACK BY AND VERIFIED WITH: PHARMD B AGEE [id number] AT 1021 AM BY CM    Streptococcus pyogenes NOT DETECTED NOT DETECTED Final   A.calcoaceticus-baumannii NOT DETECTED NOT DETECTED Final   Bacteroides fragilis NOT DETECTED NOT DETECTED Final   Enterobacterales NOT DETECTED NOT DETECTED Final   Enterobacter cloacae complex NOT DETECTED NOT DETECTED Final   Escherichia coli NOT DETECTED NOT DETECTED Final   Klebsiella aerogenes NOT DETECTED NOT DETECTED Final   Klebsiella oxytoca NOT DETECTED NOT DETECTED Final   Klebsiella pneumoniae NOT DETECTED NOT DETECTED Final   Proteus species NOT DETECTED NOT DETECTED Final   Salmonella species NOT DETECTED NOT DETECTED Final   Serratia marcescens NOT DETECTED NOT DETECTED Final   Haemophilus influenzae NOT DETECTED NOT DETECTED Final   Neisseria meningitidis NOT DETECTED NOT DETECTED Final   Pseudomonas aeruginosa NOT DETECTED NOT DETECTED Final   Stenotrophomonas maltophilia NOT DETECTED NOT DETECTED Final   Candida albicans NOT DETECTED NOT DETECTED Final   Candida auris NOT DETECTED NOT DETECTED Final   Candida glabrata NOT DETECTED NOT DETECTED Final   Candida krusei NOT DETECTED NOT DETECTED Final   Candida parapsilosis NOT DETECTED NOT DETECTED Final   Candida tropicalis NOT DETECTED NOT DETECTED Final    Cryptococcus neoformans/gattii NOT DETECTED NOT DETECTED Final    Comment: Performed at [facility], [address] [place], [place] [address]  Resp panel by RT-PCR (RSV, Flu A&B, Covid) Anterior Nasal Swab     Status: None   Collection Time: 10/11/22  2:36 PM   Specimen: Anterior Nasal Swab  Result Value Ref Range Status   SARS Coronavirus 2 by RT PCR NEGATIVE NEGATIVE Final   Influenza A by PCR NEGATIVE NEGATIVE Final   Influenza B by PCR NEGATIVE NEGATIVE Final    Comment: (NOTE) The  Xpert Xpress SARS-CoV-2/FLU/RSV plus assay is intended as an aid in the diagnosis of influenza from Nasopharyngeal swab specimens and should not be used as a sole basis for treatment. Nasal washings and aspirates are unacceptable for Xpert Xpress SARS-CoV-2/FLU/RSV testing.  Fact Sheet for Patients: BloggerCourse.com  Fact Sheet for Healthcare Providers: SeriousBroker.it  This test is not yet approved or cleared by the [place] FDA and has been authorized for detection and/or diagnosis of SARS-CoV-2 by FDA under an Emergency Use Authorization (EUA). This EUA will remain in effect (meaning this test can be used) for the duration of the COVID-19 declaration under Section 564(b)(1) of the Act, 21 U.S.C. section 360bbb-3(b)(1), unless the authorization is terminated or revoked.     Resp Syncytial Virus by PCR NEGATIVE NEGATIVE Final    Comment: (NOTE) Fact Sheet for Patients: BloggerCourse.com  Fact Sheet for Healthcare Providers: SeriousBroker.it  This test is not yet approved or cleared by the [place] FDA and has been authorized for detection and/or diagnosis of SARS-CoV-2 by FDA under an Emergency Use Authorization (EUA). This EUA will remain in effect (meaning this test can be used) for the duration of the COVID-19 declaration under Section 564(b)(1) of the Act, 21  U.S.C. section 360bbb-3(b)(1), unless the authorization is terminated or revoked.  Performed at [facility], [address] [place], [place] [address]   Blood culture (routine x 2)     Status: None (Preliminary result)   Collection Time: 10/11/22  2:40 PM   Specimen: BLOOD LEFT HAND  Result Value Ref Range Status   Specimen Description BLOOD LEFT HAND  Final   Special Requests   Final    BOTTLES DRAWN AEROBIC AND ANAEROBIC Blood Culture results may not be optimal due to an inadequate volume of blood received in culture bottles   Culture   Final    NO GROWTH 4 DAYS Performed at [facility], [address], [place], [place] [address]    Report Status PENDING  Incomplete  SARS Coronavirus 2 by RT PCR (hospital order, performed in [facility] hospital lab) *cepheid single result test* Anterior Nasal Swab     Status: None   Collection Time: 10/11/22  3:55 PM   Specimen: Anterior Nasal Swab  Result Value Ref Range Status   SARS Coronavirus 2 by RT PCR NEGATIVE NEGATIVE Final    Comment: Performed at [facility], [address], [place], [place] [address]  Urine Culture     Status: Abnormal   Collection Time: 10/11/22  4:08 PM   Specimen: Urine, Random  Result Value Ref Range Status   Specimen Description URINE, RANDOM  Final   Special Requests   Final    NONE Reflexed from [id number] Performed at [facility], [address] [place], [place] [address]    Culture MULTIPLE SPECIES PRESENT, SUGGEST RECOLLECTION (A)  Final   Report Status 10/13/2022 FINAL  Final  MRSA Next Gen by PCR, Nasal     Status: None   Collection Time: 10/11/22  4:37 PM   Specimen: Nasal Mucosa; Nasal Swab  Result Value Ref Range Status   MRSA by PCR Next Gen NOT DETECTED NOT DETECTED Final    Comment: (NOTE) The GeneXpert MRSA Assay (FDA approved for NASAL specimens only), is one component of a comprehensive MRSA colonization surveillance program. It is not intended to diagnose MRSA  infection nor to guide or monitor treatment for MRSA infections. Test performance is not FDA approved in  patients less than 37 years old. Performed at [facility], [address] [place], [place] [address]   Culture, blood (single) w Reflex to ID Panel     Status: None (Preliminary result)   Collection Time: 10/14/22  6:25 PM   Specimen: BLOOD  Result Value Ref Range Status   Specimen Description BLOOD SITE NOT SPECIFIED  Final   Special Requests   Final    BOTTLES DRAWN AEROBIC AND ANAEROBIC Blood Culture results may not be optimal due to an excessive volume of blood received in culture bottles   Culture   Final    NO GROWTH < 24 HOURS Performed at [facility], [address] [place], [place] [address]    Report Status PENDING  Incomplete         Radiology Studies: No results found.      Scheduled Meds:  acidophilus  1 capsule Oral BID   amLODipine  5 mg Oral Daily   amoxicillin  1,000 mg Oral Q8H   apixaban  5 mg Oral BID   dextromethorphan-guaiFENesin  1 tablet Oral BID   diltiazem  60 mg Oral Q12H   gabapentin  100 mg Oral Q8H   insulin aspart  0-5 Units Subcutaneous QHS   insulin aspart  0-9 Units Subcutaneous TID WC   pantoprazole  40 mg Oral Daily   polyethylene glycol  17 g Oral Daily   senna-docusate  1 tablet Oral QHS   sodium chloride flush  3 mL Intravenous Q12H   spironolactone  12.5 mg Oral Daily   Continuous Infusions:     LOS: 4 days    Time spent: 35 minutes    Lebron Nauert A Skippy Marhefka, MD [facility]   If 7PM-7AM, please contact night-coverage [email or web address]  10/15/2022, 12:08 PM

## 2022-10-15 NOTE — TOC Transition Note (Addendum)
Transition of Care [facility]) - CM/SW Discharge Note   Patient Details  Name: [name] MRN: [id number] Date of Birth: [birth year]  [facility] of [facility] CM/SW Contact:  Leone Haven, RN Phone Number: 10/15/2022, 10:00 AM   Clinical Narrative:    Patient is for dc today , she states her daughter will transport her home today.  She states Social Services provide transport for him to her doctors apts also. She will be on eliquis, copay is  zero dollars.  NCM gave patient the 30 day trial coupon for eliquis.   Staff RN will check to see if patient will need home oxygen, is so she would like to use [employer] for supplier. Patient will need oxygen per ambulatory sats.  NCM made referral to [facility], per patient 's request.  Per [facility] they have to check to make sure they are in network with insurance because they are OON with some of the [facility] plans. Patient has oxygen with them already for nighttime use as well.  Per Rep they are in network with them, they will have someone send a tank to room for patient to go home with today. Per rep at [facility] someone will be here in about 45 mins with the tank.      Barriers to Discharge: Continued Medical Work up   Patient Goals and CMS Choice   Choice offered to / list presented to : NA  Discharge Placement                         Discharge Plan and Services Additional resources added to the After Visit Summary for   In-house Referral: NA Discharge Planning Services: CM Consult Post Acute Care Choice: NA          DME Arranged: N/A DME Agency: NA       HH Arranged: NA          Social Determinants of Health (SDOH) Interventions SDOH Screenings   Food Insecurity: No Food Insecurity (10/12/2022)  Housing: Low Risk  (10/12/2022)  Transportation Needs: No Transportation Needs (10/12/2022)  Utilities: At Risk (10/12/2022)  Tobacco Use: Medium Risk (10/11/2022)     Readmission Risk Interventions     No data to display

## 2022-10-15 NOTE — Discharge Instructions (Signed)
Resume plaquenil in 5 days.,

## 2022-10-15 NOTE — Evaluation (Signed)
Occupational Therapy Evaluation Patient Details Name: [name] MRN: [id number] DOB: 12/14/1940 Today's Date: 10/15/2022   History of Present Illness Patient is an 82 y/o female admitted 10/11/22 with SOB, fever and cough.  Found to have multifocal pneumonia, strep pneumonia bacteremia. PMH positive for lupus, ILD, h/o breast cancer, PAF, hypothyroidism, DM2, hyperlipidemia, HTN, and frequent UTI's.   Clinical Impression   Patient lives at home with her daughter in a 2nd level apartment and uses her cane intermittently for community distances.  Patient reports being Independent in ADLs and most IADLs although she does not drive.  Patient currently presents with decreased activity tolerance and generalized weakness  and decreased independence in ADLs  requiring min A for LB ADLs and supervision/CGA for functional mobility with RW.  Patient desats to 86% on RA and recovers to 90-91% on RA s/p seated rest break. Patient would benefit from additional OT intervention to address functional deficits in order for patient to return to PLOF.       If plan is discharge home, recommend the following: A little help with bathing/dressing/bathroom;Assistance with cooking/housework;Assist for transportation;Direct supervision/assist for financial management    Functional Status Assessment  Patient has had a recent decline in their functional status and demonstrates the ability to make significant improvements in function in a reasonable and predictable amount of time.  Equipment Recommendations  Tub/shower bench    Recommendations for Other Services       Precautions / Restrictions Precautions Precautions: Fall Precaution Comments: watch SpO2 (desats to 86% but able to rebound to 90% with seated rest break) Restrictions Weight Bearing Restrictions: No      Mobility Bed Mobility Overal bed mobility: Modified Independent                  Transfers Overall transfer level: Needs  assistance Equipment used: Rolling walker (2 wheels) Transfers: Sit to/from Stand Sit to Stand: Supervision                  Balance Overall balance assessment: Needs assistance   Sitting balance-Leahy Scale: Good         Standing balance comment: can stand without UE support, using walker for ambulation                           ADL either performed or assessed with clinical judgement   ADL Overall ADL's : Needs assistance/impaired Eating/Feeding: Independent;Sitting   Grooming: Wash/dry hands;Wash/dry face;Oral care;Set up;Supervision/safety (patient fatigues easily and requires seated rest break during grooming task)   Upper Body Bathing: Set up;Sitting   Lower Body Bathing: Minimal assistance   Upper Body Dressing : Set up;Sitting   Lower Body Dressing: Minimal assistance (to thread R LE into underwear)   Toilet Transfer: Contact guard assist;Rolling walker (2 wheels)   Toileting- Clothing Manipulation and Hygiene: Contact guard assist       Functional mobility during ADLs: Supervision/safety;Contact guard assist       Vision Baseline Vision/History: 1 Wears glasses Patient Visual Report: No change from baseline       Perception         Praxis         Pertinent Vitals/Pain Pain Assessment Pain Assessment: No/denies pain Pain Intervention(s): Monitored during session     Extremity/Trunk Assessment Upper Extremity Assessment Upper Extremity Assessment: Generalized weakness   Lower Extremity Assessment Lower Extremity Assessment: Generalized weakness   Cervical / Trunk Assessment Cervical / Trunk Assessment: Normal  Communication Communication Communication: Hearing impairment   Cognition Arousal: Alert Behavior During Therapy: WFL for tasks assessed/performed Overall Cognitive Status: Within Functional Limits for tasks assessed                                       General Comments  SpO2 91% at rest on  RA, dropped to 77% ambulation on RA, applied 2LPM O2 SpO2 88%    Exercises     Shoulder Instructions      Home Living Family/patient expects to be discharged to:: Private residence Living Arrangements: Children Available Help at Discharge: Family;Available 24 hours/day Type of Home: Apartment Home Access: Stairs to enter [employer] of Steps: flight Entrance Stairs-Rails: Right;Left Home Layout: One level     Bathroom Shower/Tub: Sponge bathes at baseline   Bathroom Toilet: Standard     Home Equipment: Cane - single point;BSC/3in1          Prior Functioning/Environment Prior Level of Function : Independent/Modified Independent             Mobility Comments: independent, does some IADLS. Uses SPC PRN, no driving          OT Problem List: Decreased strength;Decreased activity tolerance      OT Treatment/Interventions: Self-care/ADL training;Therapeutic exercise;Energy conservation;Therapeutic activities;Patient/family education    OT Goals(Current goals can be found in the care plan section) Acute Rehab OT Goals OT Goal Formulation: With patient Time For Goal Achievement: 10/29/22 Potential to Achieve Goals: Good  OT Frequency: Min 1X/week    Co-evaluation              AM-PAC OT "6 Clicks" Daily Activity     Outcome Measure Help from another person eating meals?: None Help from another person taking care of personal grooming?: A Little Help from another person toileting, which includes using toliet, bedpan, or urinal?: A Little Help from another person bathing (including washing, rinsing, drying)?: A Little Help from another person to put on and taking off regular upper body clothing?: None Help from another person to put on and taking off regular lower body clothing?: A Little 6 Click Score: 20   End of Session Equipment Utilized During Treatment: Rolling walker (2 wheels) Nurse Communication: Mobility status (patient desats on room  air)  Activity Tolerance: Patient tolerated treatment well Patient left: in chair;with call bell/phone within reach  OT Visit Diagnosis: Unsteadiness on feet (R26.81);Muscle weakness (generalized) (M62.81)                Time: 1610-9604 OT Time Calculation (min): 44 min Charges:  OT General Charges $OT Visit: 1 Visit OT Evaluation $OT Eval Moderate Complexity: 1 Mod OT Treatments $Self Care/Home Management : 23-37 mins  Governor Specking OT/L Denice Paradise 10/15/2022, 2:27 PM

## 2022-10-15 NOTE — Evaluation (Signed)
Physical Therapy Evaluation Patient Details Name: [name] MRN: [id number] DOB: 02/03/1940 Today's Date: 10/15/2022  History of Present Illness  Patient is an 82 y/o female admitted 10/11/22 with SOB, fever and cough.  Found to have multifocal pneumonia, strep pneumonia bacteremia. PMH positive for lupus, ILD, h/o breast cancer, PAF, hypothyroidism, DM2, hyperlipidemia, HTN, and frequent UTI's.  Clinical Impression  Patient presents with decreased mobility due to generalized weakness, decreased cardiopulmonary endurance and decreased activity tolerance.  She was previously independent using cane for ambulation and today needing S overall for ambulation with RW limited by SOB and hypoxia on RA.  She was on RA initially with SpO2 91-92%, with ambulation dropping to mid 70's despite applying new probe so applied O2 @ 2LPM with improvement to 88%.  Feel she will need another session for stair negotiation prior to d/c and may need home O2 and HHPT.  PT will follow up.       If plan is discharge home, recommend the following: Assistance with cooking/housework;Assist for transportation;Help with stairs or ramp for entrance   Can travel by private vehicle        Equipment Recommendations Rolling walker (2 wheels)  Recommendations for Other Services       Functional Status Assessment Patient has had a recent decline in their functional status and demonstrates the ability to make significant improvements in function in a reasonable and predictable amount of time.     Precautions / Restrictions Precautions Precautions: Fall Precaution Comments: watch SpO2      Mobility  Bed Mobility               General bed mobility comments: in recliner following OT    Transfers Overall transfer level: Needs assistance Equipment used: Rolling walker (2 wheels) Transfers: Sit to/from Stand Sit to Stand: Supervision           General transfer comment: use of UE support for sit to stand     Ambulation/Gait Ambulation/Gait assistance: Supervision Gait Distance (Feet): 150 Feet Assistive device: Rolling walker (2 wheels) Gait Pattern/deviations: Step-through pattern, Decreased stride length, Decreased stance time - left, Antalgic       General Gait Details: fatigued in hallway and needing to stop and return to room; on RA initially, applied O2 as SpO2 dropped  Stairs            Wheelchair Mobility     Tilt Bed    Modified Rankin (Stroke Patients Only)       Balance Overall balance assessment: Needs assistance   Sitting balance-Leahy Scale: Good       Standing balance-Leahy Scale: Fair Standing balance comment: can stand without UE support, using walker for ambulation                             Pertinent Vitals/Pain Pain Assessment Pain Assessment: Faces Faces Pain Scale: Hurts little more Pain Location: L knee with ambulation Pain Descriptors / Indicators: Guarding Pain Intervention(s): Monitored during session    Home Living Family/patient expects to be discharged to:: Private residence Living Arrangements: Children Available Help at Discharge: Family;Available 24 hours/day Type of Home: Apartment Home Access: Stairs to enter Entrance Stairs-Rails: Right;Left Entrance Stairs-Number of Steps: flight   Home Layout: One level Home Equipment: Cane - single point;BSC/3in1      Prior Function Prior Level of Function : Independent/Modified Independent             Mobility Comments:  independent, does some IADLS. Uses SPC PRN, no driving       Extremity/Trunk Assessment   Upper Extremity Assessment Upper Extremity Assessment: Defer to OT evaluation    Lower Extremity Assessment Lower Extremity Assessment: Generalized weakness       Communication   Communication Communication: Hearing impairment  Cognition Arousal: Alert Behavior During Therapy: WFL for tasks assessed/performed Overall Cognitive Status: Within  Functional Limits for tasks assessed                                          General Comments General comments (skin integrity, edema, etc.): SpO2 91% at rest on RA, dropped to 77% ambulation on RA, applied 2LPM O2 SpO2 88%    Exercises     Assessment/Plan    PT Assessment Patient needs continued PT services  PT Problem List Decreased strength;Decreased balance;Decreased activity tolerance;Decreased knowledge of use of DME;Cardiopulmonary status limiting activity       PT Treatment Interventions DME instruction;Functional mobility training;Gait training;Stair training;Therapeutic exercise;Therapeutic activities;Balance training;Patient/family education    PT Goals (Current goals can be found in the Care Plan section)  Acute Rehab PT Goals Patient Stated Goal: to return to independent PT Goal Formulation: With patient Time For Goal Achievement: 10/29/22 Potential to Achieve Goals: Good    Frequency Min 1X/week     Co-evaluation               AM-PAC PT "6 Clicks" Mobility  Outcome Measure Help needed turning from your back to your side while in a flat bed without using bedrails?: A Little Help needed moving from lying on your back to sitting on the side of a flat bed without using bedrails?: A Little Help needed moving to and from a bed to a chair (including a wheelchair)?: A Little Help needed standing up from a chair using your arms (e.g., wheelchair or bedside chair)?: None Help needed to walk in hospital room?: A Little Help needed climbing 3-5 steps with a railing? : Total 6 Click Score: 17    End of Session Equipment Utilized During Treatment: Gait belt;Oxygen Activity Tolerance: Patient limited by fatigue Patient left: in chair;with call bell/phone within reach   PT Visit Diagnosis: Muscle weakness (generalized) (M62.81);Difficulty in walking, not elsewhere classified (R26.2)    Time: 1610-9604 PT Time Calculation (min) (ACUTE ONLY): 25  min   Charges:   PT Evaluation $PT Eval Moderate Complexity: 1 Mod PT Treatments $Gait Training: 8-22 mins PT General Charges $$ ACUTE PT VISIT: 1 Visit         Sheran Lawless, PT Acute Rehabilitation Services Office:[phone] 10/15/2022   [name] 10/15/2022, 11:08 AM

## 2022-10-16 DIAGNOSIS — A419 Sepsis, unspecified organism: Secondary | ICD-10-CM | POA: Diagnosis not present

## 2022-10-16 DIAGNOSIS — J189 Pneumonia, unspecified organism: Secondary | ICD-10-CM | POA: Diagnosis not present

## 2022-10-16 LAB — CULTURE, BLOOD (ROUTINE X 2): Culture: NO GROWTH

## 2022-10-16 LAB — GLUCOSE, CAPILLARY: Glucose-Capillary: 125 mg/dL — ABNORMAL HIGH (ref 70–99)

## 2022-10-16 NOTE — Discharge Summary (Signed)
10[birth year]       BSA:          1.778 m Patient Age:    82 years       BP:           132/71 mmHg Patient Gender: F              HR:           65 bpm. Exam Location:  Inpatient Procedure: 2D Echo, Cardiac Doppler and Color Doppler Indications:    I48.92* Unspecified atrial flutter  History:        Patient has prior history of Echocardiogram examinations, most                 recent 05/08/2022. CHF; Risk Factors:Hypertension, Diabetes and                 Dyslipidemia.  Sonographer:    Irving Burton Senior RDCS Referring Phys: Clydie Braun IMPRESSIONS  1. Left ventricular ejection fraction, by estimation, is 60 to 65%. The left ventricle has normal function. The left ventricle has no regional wall motion abnormalities. There is mild left ventricular hypertrophy. Left ventricular diastolic parameters are consistent with Grade II diastolic dysfunction (pseudonormalization). Elevated left atrial pressure.  2. Right ventricular systolic function is normal. The right ventricular size is normal. There is mildly elevated pulmonary artery systolic pressure.  3. Left atrial size was mildly dilated.  4. Right atrial size was mildly dilated.  5. The mitral valve is normal in structure. Mild mitral valve regurgitation. No evidence of mitral stenosis.  6. The aortic valve is tricuspid. Aortic valve regurgitation is trivial. Aortic valve sclerosis is present, with no evidence of aortic valve stenosis.  7. The inferior vena cava  is normal in size with greater than 50% respiratory variability, suggesting right atrial pressure of 3 mmHg. FINDINGS  Left Ventricle: Left ventricular ejection fraction, by estimation, is 60 to 65%. The left ventricle has normal function. The left ventricle has no regional wall motion abnormalities. The left ventricular internal cavity size was normal in size. There is  mild left ventricular hypertrophy. Left ventricular diastolic parameters are consistent with Grade II diastolic dysfunction (pseudonormalization). Elevated left atrial pressure. Right Ventricle: The right ventricular size is normal. Right ventricular systolic function is normal. There is mildly elevated pulmonary artery systolic pressure. The tricuspid regurgitant velocity is 3.09 m/s, and with an assumed right atrial pressure of 3 mmHg, the estimated right ventricular systolic pressure is 41.2 mmHg. Left Atrium: Left atrial size was mildly dilated. Right Atrium: Right atrial size was mildly dilated. Pericardium: There is no evidence of pericardial effusion. Mitral Valve: The mitral valve is normal in structure. There is severe calcification of the mitral valve leaflet(s). Mild mitral valve regurgitation. No evidence of mitral valve stenosis. Tricuspid Valve: The tricuspid valve is normal in structure. Tricuspid valve regurgitation is mild . No evidence of tricuspid stenosis. Aortic Valve: The aortic valve is tricuspid. Aortic valve regurgitation is trivial. Aortic valve sclerosis is present, with no evidence of aortic valve stenosis. Pulmonic Valve: The pulmonic valve was normal in structure. Pulmonic valve regurgitation is mild. No evidence of pulmonic stenosis. Aorta: The aortic root is normal in size and structure. Venous: The inferior vena cava is normal in size with greater than 50% respiratory variability, suggesting right atrial pressure of 3 mmHg. IAS/Shunts: No atrial level shunt detected by color flow Doppler.  LEFT VENTRICLE PLAX 2D  LVIDd:  10/02/42       BSA:          1.778 m Patient Age:    82 years       BP:           132/71 mmHg Patient Gender: F              HR:           65 bpm. Exam Location:  Inpatient Procedure: 2D Echo, Cardiac Doppler and Color Doppler Indications:    I48.92* Unspecified atrial flutter  History:        Patient has prior history of Echocardiogram examinations, most                 recent 05/08/2022. CHF; Risk Factors:Hypertension, Diabetes and                 Dyslipidemia.  Sonographer:    Irving Burton Senior RDCS Referring Phys: Clydie Braun IMPRESSIONS  1. Left ventricular ejection fraction, by estimation, is 60 to 65%. The left ventricle has normal function. The left ventricle has no regional wall motion abnormalities. There is mild left ventricular hypertrophy. Left ventricular diastolic parameters are consistent with Grade II diastolic dysfunction (pseudonormalization). Elevated left atrial pressure.  2. Right ventricular systolic function is normal. The right ventricular size is normal. There is mildly elevated pulmonary artery systolic pressure.  3. Left atrial size was mildly dilated.  4. Right atrial size was mildly dilated.  5. The mitral valve is normal in structure. Mild mitral valve regurgitation. No evidence of mitral stenosis.  6. The aortic valve is tricuspid. Aortic valve regurgitation is trivial. Aortic valve sclerosis is present, with no evidence of aortic valve stenosis.  7. The inferior vena cava  is normal in size with greater than 50% respiratory variability, suggesting right atrial pressure of 3 mmHg. FINDINGS  Left Ventricle: Left ventricular ejection fraction, by estimation, is 60 to 65%. The left ventricle has normal function. The left ventricle has no regional wall motion abnormalities. The left ventricular internal cavity size was normal in size. There is  mild left ventricular hypertrophy. Left ventricular diastolic parameters are consistent with Grade II diastolic dysfunction (pseudonormalization). Elevated left atrial pressure. Right Ventricle: The right ventricular size is normal. Right ventricular systolic function is normal. There is mildly elevated pulmonary artery systolic pressure. The tricuspid regurgitant velocity is 3.09 m/s, and with an assumed right atrial pressure of 3 mmHg, the estimated right ventricular systolic pressure is 41.2 mmHg. Left Atrium: Left atrial size was mildly dilated. Right Atrium: Right atrial size was mildly dilated. Pericardium: There is no evidence of pericardial effusion. Mitral Valve: The mitral valve is normal in structure. There is severe calcification of the mitral valve leaflet(s). Mild mitral valve regurgitation. No evidence of mitral valve stenosis. Tricuspid Valve: The tricuspid valve is normal in structure. Tricuspid valve regurgitation is mild . No evidence of tricuspid stenosis. Aortic Valve: The aortic valve is tricuspid. Aortic valve regurgitation is trivial. Aortic valve sclerosis is present, with no evidence of aortic valve stenosis. Pulmonic Valve: The pulmonic valve was normal in structure. Pulmonic valve regurgitation is mild. No evidence of pulmonic stenosis. Aorta: The aortic root is normal in size and structure. Venous: The inferior vena cava is normal in size with greater than 50% respiratory variability, suggesting right atrial pressure of 3 mmHg. IAS/Shunts: No atrial level shunt detected by color flow Doppler.  LEFT VENTRICLE PLAX 2D  LVIDd:  10/02/42       BSA:          1.778 m Patient Age:    82 years       BP:           132/71 mmHg Patient Gender: F              HR:           65 bpm. Exam Location:  Inpatient Procedure: 2D Echo, Cardiac Doppler and Color Doppler Indications:    I48.92* Unspecified atrial flutter  History:        Patient has prior history of Echocardiogram examinations, most                 recent 05/08/2022. CHF; Risk Factors:Hypertension, Diabetes and                 Dyslipidemia.  Sonographer:    Irving Burton Senior RDCS Referring Phys: Clydie Braun IMPRESSIONS  1. Left ventricular ejection fraction, by estimation, is 60 to 65%. The left ventricle has normal function. The left ventricle has no regional wall motion abnormalities. There is mild left ventricular hypertrophy. Left ventricular diastolic parameters are consistent with Grade II diastolic dysfunction (pseudonormalization). Elevated left atrial pressure.  2. Right ventricular systolic function is normal. The right ventricular size is normal. There is mildly elevated pulmonary artery systolic pressure.  3. Left atrial size was mildly dilated.  4. Right atrial size was mildly dilated.  5. The mitral valve is normal in structure. Mild mitral valve regurgitation. No evidence of mitral stenosis.  6. The aortic valve is tricuspid. Aortic valve regurgitation is trivial. Aortic valve sclerosis is present, with no evidence of aortic valve stenosis.  7. The inferior vena cava  is normal in size with greater than 50% respiratory variability, suggesting right atrial pressure of 3 mmHg. FINDINGS  Left Ventricle: Left ventricular ejection fraction, by estimation, is 60 to 65%. The left ventricle has normal function. The left ventricle has no regional wall motion abnormalities. The left ventricular internal cavity size was normal in size. There is  mild left ventricular hypertrophy. Left ventricular diastolic parameters are consistent with Grade II diastolic dysfunction (pseudonormalization). Elevated left atrial pressure. Right Ventricle: The right ventricular size is normal. Right ventricular systolic function is normal. There is mildly elevated pulmonary artery systolic pressure. The tricuspid regurgitant velocity is 3.09 m/s, and with an assumed right atrial pressure of 3 mmHg, the estimated right ventricular systolic pressure is 41.2 mmHg. Left Atrium: Left atrial size was mildly dilated. Right Atrium: Right atrial size was mildly dilated. Pericardium: There is no evidence of pericardial effusion. Mitral Valve: The mitral valve is normal in structure. There is severe calcification of the mitral valve leaflet(s). Mild mitral valve regurgitation. No evidence of mitral valve stenosis. Tricuspid Valve: The tricuspid valve is normal in structure. Tricuspid valve regurgitation is mild . No evidence of tricuspid stenosis. Aortic Valve: The aortic valve is tricuspid. Aortic valve regurgitation is trivial. Aortic valve sclerosis is present, with no evidence of aortic valve stenosis. Pulmonic Valve: The pulmonic valve was normal in structure. Pulmonic valve regurgitation is mild. No evidence of pulmonic stenosis. Aorta: The aortic root is normal in size and structure. Venous: The inferior vena cava is normal in size with greater than 50% respiratory variability, suggesting right atrial pressure of 3 mmHg. IAS/Shunts: No atrial level shunt detected by color flow Doppler.  LEFT VENTRICLE PLAX 2D  LVIDd:  Physician Discharge Summary   Patient: [name] MRN: [id number] DOB: 1940/11/24  Admit date:     10/11/2022  Discharge date: 10/16/22  Discharge Physician: Alba Cory   PCP: Merri Brunette, MD   Recommendations at discharge:    Please follow up final blood cultures  Follow up resolution of Bacteremia.  FU with cardiology for further care of A fib  Discharge Diagnoses: Principal Problem:   Sepsis due to pneumonia [facility]) Active Problems:   UTI (urinary tract infection)   Paroxysmal atrial flutter (HCC)   Hyponatremia   AKI (acute kidney injury) (HCC)   Diarrhea   Diabetes mellitus type 2, noninsulin dependent (HCC)   Chronic diastolic CHF (congestive heart failure) (HCC)   Hypothyroidism   History of systemic lupus erythematosus (SLE) (HCC)   ILD (interstitial lung disease) (HCC)   Controlled type 2 diabetes mellitus without complication, without long-term current use of insulin (HCC)   Mixed hyperlipidemia   Benign hypertension   Acute febrile illness   Long term (current) use of anticoagulants  Resolved Problems:   * No resolved hospital problems. *  Hospital Course: 82 year old with past medical history significant for hypertension, CAD, a flutter, interstitial lung disease, diabetes type 2, CKD stage IIIa, S LEE, hypothyroidism, remote history of breast cancer status postlumpectomy who presents complaining of cough, shortness of breath, fever for 1 week.  She was noted to be tachycardic heart rate in the 180s while she was evaluated by EMS.  She received IV fluids, IV diltiazem.  In the ED heart rate continue to be in the 140s, found to have lactic acidosis, increased BNP, chest x-ray consistent with interstitial lung disease, CT angio chest, negative for PE, finding consistent with multifocal pneumonia.   Patient admitted with multifocal pneumonia, strep pneumonia bacteremia, paroxysmal  A flutter.  Evaluated by cardiology.  She was initially on Cardizem drip  subsequently transition to oral Cardizem.  Started on Eliquis.      Assessment and Plan: 1-Sepsis severe secondary to multifocal pneumonia,  -Strep pneumonia bacteremia -Patient presents with tachypnea, tachycardia, leukocytosis, lactic acidosis.  CTA chest consistent with multifocal pneumonia -Blood Cultures positive for strep pneumoniae Urine strep pneumoniae positive.  Legionella antigen negative. -White blood cell trending down. -Transition to Amoxicillin complete 10 days.  Hypoxic on ambulation. Home oxygen arrange.   2-UTI: Treated IV ceftriaxone   Paroxysmal atrial flutter On Cardizem to oral Cardizem Heparin drip was transitioned to Eliquis Cardiology assistance   Elevated D-dimer: CTA chest negative for PE   AKI on CKD stage III A Improved with IV fluids Creatinine  peaked to 1.9  resolved.   Hypnatremia: Improved with IV fluids.   Chronic Diastolic heart failure: compensated.  Resume spironolactone.    Diabetes type 2 with hyperglycemia: Continue with sliding scale insulin   Interstitial. lung disease, lupus Holding Plaquenil in the setting of infection resume plaquenil in 5 days.   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 By CT chest./  -needs follow up with PCP/oncology.             Consultants: cardiology  Procedures performed: none Disposition: Home Diet recommendation:  Discharge Diet Orders (From admission, onward)     Start     Ordered   10/16/22 0000  Diet - low sodium heart healthy        10/16/22 0914   10/15/22 0000  Diet - low sodium heart healthy  Physician Discharge Summary   Patient: [name] MRN: [id number] DOB: 1940/11/24  Admit date:     10/11/2022  Discharge date: 10/16/22  Discharge Physician: Alba Cory   PCP: Merri Brunette, MD   Recommendations at discharge:    Please follow up final blood cultures  Follow up resolution of Bacteremia.  FU with cardiology for further care of A fib  Discharge Diagnoses: Principal Problem:   Sepsis due to pneumonia [facility]) Active Problems:   UTI (urinary tract infection)   Paroxysmal atrial flutter (HCC)   Hyponatremia   AKI (acute kidney injury) (HCC)   Diarrhea   Diabetes mellitus type 2, noninsulin dependent (HCC)   Chronic diastolic CHF (congestive heart failure) (HCC)   Hypothyroidism   History of systemic lupus erythematosus (SLE) (HCC)   ILD (interstitial lung disease) (HCC)   Controlled type 2 diabetes mellitus without complication, without long-term current use of insulin (HCC)   Mixed hyperlipidemia   Benign hypertension   Acute febrile illness   Long term (current) use of anticoagulants  Resolved Problems:   * No resolved hospital problems. *  Hospital Course: 82 year old with past medical history significant for hypertension, CAD, a flutter, interstitial lung disease, diabetes type 2, CKD stage IIIa, S LEE, hypothyroidism, remote history of breast cancer status postlumpectomy who presents complaining of cough, shortness of breath, fever for 1 week.  She was noted to be tachycardic heart rate in the 180s while she was evaluated by EMS.  She received IV fluids, IV diltiazem.  In the ED heart rate continue to be in the 140s, found to have lactic acidosis, increased BNP, chest x-ray consistent with interstitial lung disease, CT angio chest, negative for PE, finding consistent with multifocal pneumonia.   Patient admitted with multifocal pneumonia, strep pneumonia bacteremia, paroxysmal  A flutter.  Evaluated by cardiology.  She was initially on Cardizem drip  subsequently transition to oral Cardizem.  Started on Eliquis.      Assessment and Plan: 1-Sepsis severe secondary to multifocal pneumonia,  -Strep pneumonia bacteremia -Patient presents with tachypnea, tachycardia, leukocytosis, lactic acidosis.  CTA chest consistent with multifocal pneumonia -Blood Cultures positive for strep pneumoniae Urine strep pneumoniae positive.  Legionella antigen negative. -White blood cell trending down. -Transition to Amoxicillin complete 10 days.  Hypoxic on ambulation. Home oxygen arrange.   2-UTI: Treated IV ceftriaxone   Paroxysmal atrial flutter On Cardizem to oral Cardizem Heparin drip was transitioned to Eliquis Cardiology assistance   Elevated D-dimer: CTA chest negative for PE   AKI on CKD stage III A Improved with IV fluids Creatinine  peaked to 1.9  resolved.   Hypnatremia: Improved with IV fluids.   Chronic Diastolic heart failure: compensated.  Resume spironolactone.    Diabetes type 2 with hyperglycemia: Continue with sliding scale insulin   Interstitial. lung disease, lupus Holding Plaquenil in the setting of infection resume plaquenil in 5 days.   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 By CT chest./  -needs follow up with PCP/oncology.             Consultants: cardiology  Procedures performed: none Disposition: Home Diet recommendation:  Discharge Diet Orders (From admission, onward)     Start     Ordered   10/16/22 0000  Diet - low sodium heart healthy        10/16/22 0914   10/15/22 0000  Diet - low sodium heart healthy  Physician Discharge Summary   Patient: [name] MRN: [id number] DOB: 1940/11/24  Admit date:     10/11/2022  Discharge date: 10/16/22  Discharge Physician: Alba Cory   PCP: Merri Brunette, MD   Recommendations at discharge:    Please follow up final blood cultures  Follow up resolution of Bacteremia.  FU with cardiology for further care of A fib  Discharge Diagnoses: Principal Problem:   Sepsis due to pneumonia [facility]) Active Problems:   UTI (urinary tract infection)   Paroxysmal atrial flutter (HCC)   Hyponatremia   AKI (acute kidney injury) (HCC)   Diarrhea   Diabetes mellitus type 2, noninsulin dependent (HCC)   Chronic diastolic CHF (congestive heart failure) (HCC)   Hypothyroidism   History of systemic lupus erythematosus (SLE) (HCC)   ILD (interstitial lung disease) (HCC)   Controlled type 2 diabetes mellitus without complication, without long-term current use of insulin (HCC)   Mixed hyperlipidemia   Benign hypertension   Acute febrile illness   Long term (current) use of anticoagulants  Resolved Problems:   * No resolved hospital problems. *  Hospital Course: 82 year old with past medical history significant for hypertension, CAD, a flutter, interstitial lung disease, diabetes type 2, CKD stage IIIa, S LEE, hypothyroidism, remote history of breast cancer status postlumpectomy who presents complaining of cough, shortness of breath, fever for 1 week.  She was noted to be tachycardic heart rate in the 180s while she was evaluated by EMS.  She received IV fluids, IV diltiazem.  In the ED heart rate continue to be in the 140s, found to have lactic acidosis, increased BNP, chest x-ray consistent with interstitial lung disease, CT angio chest, negative for PE, finding consistent with multifocal pneumonia.   Patient admitted with multifocal pneumonia, strep pneumonia bacteremia, paroxysmal  A flutter.  Evaluated by cardiology.  She was initially on Cardizem drip  subsequently transition to oral Cardizem.  Started on Eliquis.      Assessment and Plan: 1-Sepsis severe secondary to multifocal pneumonia,  -Strep pneumonia bacteremia -Patient presents with tachypnea, tachycardia, leukocytosis, lactic acidosis.  CTA chest consistent with multifocal pneumonia -Blood Cultures positive for strep pneumoniae Urine strep pneumoniae positive.  Legionella antigen negative. -White blood cell trending down. -Transition to Amoxicillin complete 10 days.  Hypoxic on ambulation. Home oxygen arrange.   2-UTI: Treated IV ceftriaxone   Paroxysmal atrial flutter On Cardizem to oral Cardizem Heparin drip was transitioned to Eliquis Cardiology assistance   Elevated D-dimer: CTA chest negative for PE   AKI on CKD stage III A Improved with IV fluids Creatinine  peaked to 1.9  resolved.   Hypnatremia: Improved with IV fluids.   Chronic Diastolic heart failure: compensated.  Resume spironolactone.    Diabetes type 2 with hyperglycemia: Continue with sliding scale insulin   Interstitial. lung disease, lupus Holding Plaquenil in the setting of infection resume plaquenil in 5 days.   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 By CT chest./  -needs follow up with PCP/oncology.             Consultants: cardiology  Procedures performed: none Disposition: Home Diet recommendation:  Discharge Diet Orders (From admission, onward)     Start     Ordered   10/16/22 0000  Diet - low sodium heart healthy        10/16/22 0914   10/15/22 0000  Diet - low sodium heart healthy  Physician Discharge Summary   Patient: [name] MRN: [id number] DOB: 1940/11/24  Admit date:     10/11/2022  Discharge date: 10/16/22  Discharge Physician: Alba Cory   PCP: Merri Brunette, MD   Recommendations at discharge:    Please follow up final blood cultures  Follow up resolution of Bacteremia.  FU with cardiology for further care of A fib  Discharge Diagnoses: Principal Problem:   Sepsis due to pneumonia [facility]) Active Problems:   UTI (urinary tract infection)   Paroxysmal atrial flutter (HCC)   Hyponatremia   AKI (acute kidney injury) (HCC)   Diarrhea   Diabetes mellitus type 2, noninsulin dependent (HCC)   Chronic diastolic CHF (congestive heart failure) (HCC)   Hypothyroidism   History of systemic lupus erythematosus (SLE) (HCC)   ILD (interstitial lung disease) (HCC)   Controlled type 2 diabetes mellitus without complication, without long-term current use of insulin (HCC)   Mixed hyperlipidemia   Benign hypertension   Acute febrile illness   Long term (current) use of anticoagulants  Resolved Problems:   * No resolved hospital problems. *  Hospital Course: 82 year old with past medical history significant for hypertension, CAD, a flutter, interstitial lung disease, diabetes type 2, CKD stage IIIa, S LEE, hypothyroidism, remote history of breast cancer status postlumpectomy who presents complaining of cough, shortness of breath, fever for 1 week.  She was noted to be tachycardic heart rate in the 180s while she was evaluated by EMS.  She received IV fluids, IV diltiazem.  In the ED heart rate continue to be in the 140s, found to have lactic acidosis, increased BNP, chest x-ray consistent with interstitial lung disease, CT angio chest, negative for PE, finding consistent with multifocal pneumonia.   Patient admitted with multifocal pneumonia, strep pneumonia bacteremia, paroxysmal  A flutter.  Evaluated by cardiology.  She was initially on Cardizem drip  subsequently transition to oral Cardizem.  Started on Eliquis.      Assessment and Plan: 1-Sepsis severe secondary to multifocal pneumonia,  -Strep pneumonia bacteremia -Patient presents with tachypnea, tachycardia, leukocytosis, lactic acidosis.  CTA chest consistent with multifocal pneumonia -Blood Cultures positive for strep pneumoniae Urine strep pneumoniae positive.  Legionella antigen negative. -White blood cell trending down. -Transition to Amoxicillin complete 10 days.  Hypoxic on ambulation. Home oxygen arrange.   2-UTI: Treated IV ceftriaxone   Paroxysmal atrial flutter On Cardizem to oral Cardizem Heparin drip was transitioned to Eliquis Cardiology assistance   Elevated D-dimer: CTA chest negative for PE   AKI on CKD stage III A Improved with IV fluids Creatinine  peaked to 1.9  resolved.   Hypnatremia: Improved with IV fluids.   Chronic Diastolic heart failure: compensated.  Resume spironolactone.    Diabetes type 2 with hyperglycemia: Continue with sliding scale insulin   Interstitial. lung disease, lupus Holding Plaquenil in the setting of infection resume plaquenil in 5 days.   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 By CT chest./  -needs follow up with PCP/oncology.             Consultants: cardiology  Procedures performed: none Disposition: Home Diet recommendation:  Discharge Diet Orders (From admission, onward)     Start     Ordered   10/16/22 0000  Diet - low sodium heart healthy        10/16/22 0914   10/15/22 0000  Diet - low sodium heart healthy  Physician Discharge Summary   Patient: [name] MRN: [id number] DOB: 1940/11/24  Admit date:     10/11/2022  Discharge date: 10/16/22  Discharge Physician: Alba Cory   PCP: Merri Brunette, MD   Recommendations at discharge:    Please follow up final blood cultures  Follow up resolution of Bacteremia.  FU with cardiology for further care of A fib  Discharge Diagnoses: Principal Problem:   Sepsis due to pneumonia [facility]) Active Problems:   UTI (urinary tract infection)   Paroxysmal atrial flutter (HCC)   Hyponatremia   AKI (acute kidney injury) (HCC)   Diarrhea   Diabetes mellitus type 2, noninsulin dependent (HCC)   Chronic diastolic CHF (congestive heart failure) (HCC)   Hypothyroidism   History of systemic lupus erythematosus (SLE) (HCC)   ILD (interstitial lung disease) (HCC)   Controlled type 2 diabetes mellitus without complication, without long-term current use of insulin (HCC)   Mixed hyperlipidemia   Benign hypertension   Acute febrile illness   Long term (current) use of anticoagulants  Resolved Problems:   * No resolved hospital problems. *  Hospital Course: 82 year old with past medical history significant for hypertension, CAD, a flutter, interstitial lung disease, diabetes type 2, CKD stage IIIa, S LEE, hypothyroidism, remote history of breast cancer status postlumpectomy who presents complaining of cough, shortness of breath, fever for 1 week.  She was noted to be tachycardic heart rate in the 180s while she was evaluated by EMS.  She received IV fluids, IV diltiazem.  In the ED heart rate continue to be in the 140s, found to have lactic acidosis, increased BNP, chest x-ray consistent with interstitial lung disease, CT angio chest, negative for PE, finding consistent with multifocal pneumonia.   Patient admitted with multifocal pneumonia, strep pneumonia bacteremia, paroxysmal  A flutter.  Evaluated by cardiology.  She was initially on Cardizem drip  subsequently transition to oral Cardizem.  Started on Eliquis.      Assessment and Plan: 1-Sepsis severe secondary to multifocal pneumonia,  -Strep pneumonia bacteremia -Patient presents with tachypnea, tachycardia, leukocytosis, lactic acidosis.  CTA chest consistent with multifocal pneumonia -Blood Cultures positive for strep pneumoniae Urine strep pneumoniae positive.  Legionella antigen negative. -White blood cell trending down. -Transition to Amoxicillin complete 10 days.  Hypoxic on ambulation. Home oxygen arrange.   2-UTI: Treated IV ceftriaxone   Paroxysmal atrial flutter On Cardizem to oral Cardizem Heparin drip was transitioned to Eliquis Cardiology assistance   Elevated D-dimer: CTA chest negative for PE   AKI on CKD stage III A Improved with IV fluids Creatinine  peaked to 1.9  resolved.   Hypnatremia: Improved with IV fluids.   Chronic Diastolic heart failure: compensated.  Resume spironolactone.    Diabetes type 2 with hyperglycemia: Continue with sliding scale insulin   Interstitial. lung disease, lupus Holding Plaquenil in the setting of infection resume plaquenil in 5 days.   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 By CT chest./  -needs follow up with PCP/oncology.             Consultants: cardiology  Procedures performed: none Disposition: Home Diet recommendation:  Discharge Diet Orders (From admission, onward)     Start     Ordered   10/16/22 0000  Diet - low sodium heart healthy        10/16/22 0914   10/15/22 0000  Diet - low sodium heart healthy

## 2022-10-16 NOTE — TOC Transition Note (Signed)
Transition of Care [facility]) - CM/SW Discharge Note   Patient Details  Name: [name] MRN: [id number] Date of Birth: [birth year]  Transition of Care [facility]) CM/SW Contact:  Leone Haven, RN Phone Number: 10/16/2022, 11:12 AM   Clinical Narrative:    Patient is for dc today, per pt/ot eval rec HHPT, HHOT, NCM offered choice, she does not have a preference.  NCM made referral to [facility] with Frances Furbish , he is able to take referral.  Soc will begin 24 to 48 hrs post dc.  NCM made referral to [facility] with [employer] for shower stool, [employer] states they do not provide this type of DME any longer they only provide resp DME.  The shower stool will be delivered to patient room prior to dc. The home oxygen has been delivered to her home to her daughter per [employer] and she has a tank in the room.    Final next level of care: Home w Home Health Services Barriers to Discharge: No Barriers Identified   Patient Goals and CMS Choice CMS Medicare.gov Compare Post Acute Care list provided to:: Patient Choice offered to / list presented to : Patient  Discharge Placement                         Discharge Plan and Services Additional resources added to the After Visit Summary for   In-house Referral: NA Discharge Planning Services: CM Consult Post Acute Care Choice: NA          DME Arranged: Shower stool DME Agency: [employer] Date DME Agency Contacted: 10/16/22 Time DME Agency Contacted: [phone] Representative spoke with at DME Agency: Vaughan Basta HH Arranged: PT, OT HH Agency: [facility] Date [facility] Agency Contacted: 10/16/22 Time HH Agency Contacted: 1111 Representative spoke with at [facility] Agency: Vaughan Basta  Social Determinants of Health (SDOH) Interventions SDOH Screenings   Food Insecurity: No Food Insecurity (10/12/2022)  Housing: Low Risk  (10/12/2022)  Transportation Needs: No Transportation Needs (10/12/2022)  Utilities: At Risk (10/12/2022)  Tobacco Use: Medium  Risk (10/11/2022)     Readmission Risk Interventions     No data to display

## 2022-10-16 NOTE — Progress Notes (Addendum)
Physical Therapy Treatment Patient Details Name: [name] MRN: [id number] DOB: 04/24/1940 Today's Date: 10/16/2022   History of Present Illness Patient is an 82 y/o female admitted 10/11/22 with SOB, fever and cough.  Found to have multifocal pneumonia, strep pneumonia bacteremia. PMH positive for lupus, ILD, h/o breast cancer, PAF, hypothyroidism, DM2, hyperlipidemia, HTN, and frequent UTI's.    PT Comments  Pt sitting EOB on arrival. Supervision transfers, supervision amb 400' with RW, and CGA ascend/descend 3 steps x 2 with R rail. SpO2 maintained in 90s on 2L continuous O2. Pt reports family with be able to assist with portable O2 on her apartment stairs. Current POC remains appropriate.     If plan is discharge home, recommend the following: Assistance with cooking/housework;Assist for transportation;Help with stairs or ramp for entrance   Can travel by private vehicle        Equipment Recommendations  Rolling walker (2 wheels)    Recommendations for Other Services       Precautions / Restrictions Precautions Precautions: Fall;Other (comment) Precaution Comments: watch SpO2 Restrictions Weight Bearing Restrictions: No     Mobility  Bed Mobility Overal bed mobility: Modified Independent                  Transfers Overall transfer level: Needs assistance Equipment used: Rolling walker (2 wheels) Transfers: Sit to/from Stand Sit to Stand: Supervision                Ambulation/Gait Ambulation/Gait assistance: Supervision Gait Distance (Feet): 400 Feet Assistive device: Rolling walker (2 wheels) Gait Pattern/deviations: Step-through pattern, Decreased stride length Gait velocity: decreased Gait velocity interpretation: <1.31 ft/sec, indicative of household ambulator   General Gait Details: Maintained SpO2 in 90s on 2L   Stairs Stairs: Yes Stairs assistance: Contact guard assist Stair Management: One rail Right, Step to pattern, Forwards Number  of Stairs: 3 (x 2) General stair comments: ascend/descend 3 steps x 2 reps without rest break in between. SpO2 in 90s on 2L   Wheelchair Mobility     Tilt Bed    Modified Rankin (Stroke Patients Only)       Balance Overall balance assessment: Needs assistance Sitting-balance support: No upper extremity supported, Feet supported Sitting balance-Leahy Scale: Good     Standing balance support: Bilateral upper extremity supported, No upper extremity supported Standing balance-Leahy Scale: Fair Standing balance comment: static stand without UE support. RW for amb                            Cognition Arousal: Alert Behavior During Therapy: WFL for tasks assessed/performed Overall Cognitive Status: Within Functional Limits for tasks assessed                                          Exercises      General Comments General comments (skin integrity, edema, etc.): Able to maintain SpO2 in 90s on 2L during activity.      Pertinent Vitals/Pain Pain Assessment Pain Assessment: No/denies pain    Home Living                          Prior Function            PT Goals (current goals can now be found in the care plan section) Acute Rehab PT Goals Patient  Stated Goal: home to see grandbabies Progress towards PT goals: Progressing toward goals    Frequency    Min 1X/week      PT Plan      Co-evaluation              AM-PAC PT "6 Clicks" Mobility   Outcome Measure  Help needed turning from your back to your side while in a flat bed without using bedrails?: None Help needed moving from lying on your back to sitting on the side of a flat bed without using bedrails?: A Little Help needed moving to and from a bed to a chair (including a wheelchair)?: A Little Help needed standing up from a chair using your arms (e.g., wheelchair or bedside chair)?: A Little Help needed to walk in hospital room?: A Little Help needed climbing  3-5 steps with a railing? : A Little 6 Click Score: 19    End of Session Equipment Utilized During Treatment: Gait belt;Oxygen Activity Tolerance: Patient tolerated treatment well Patient left: in chair;with call bell/phone within reach Nurse Communication: Mobility status PT Visit Diagnosis: Muscle weakness (generalized) (M62.81);Difficulty in walking, not elsewhere classified (R26.2)     Time: 4098-1191 PT Time Calculation (min) (ACUTE ONLY): 23 min  Charges:    $Gait Training: 23-37 mins PT General Charges $$ ACUTE PT VISIT: 1 Visit                     Ferd Glassing., PT  Office # [phone]    Ilda Foil 10/16/2022, 10:21 AM

## 2022-10-16 NOTE — Discharge Summary (Incomplete)
coronary arteries. Mild cardiomegaly. No pericardial effusion. Mediastinum/Nodes: Mildly enlarged mediastinal and right hilar lymph nodes, likely reactive. No axillary lymphadenopathy. Trachea within normal limits. Small hiatal hernia. Lungs/Pleura: Patchy airspace opacities within the bilateral lower lobes, right middle lobe, and right upper lobe. No pleural effusion or pneumothorax. Upper Abdomen: No acute abnormality. Musculoskeletal: Stable calcified mass in the medial left breast measuring up to 4.6 cm. No new or acute bony findings. Review of the MIP images confirms the above findings. IMPRESSION: 1. No evidence of pulmonary embolism. 2. Patchy airspace opacities within the bilateral lower lobes, right middle lobe, and right upper lobe, compatible with multifocal pneumonia. 3. Mildly enlarged mediastinal and right hilar lymph nodes, likely reactive. 4. Aortic and coronary artery atherosclerosis (ICD10-I70.0). 5. Stable calcified mass in the medial left breast measuring up to 4.6 cm. Correlate with prior mammographic workup. Electronically Signed   By: Duanne Guess  D.O.   On: 10/12/2022 17:51   ECHOCARDIOGRAM COMPLETE  Result Date: 10/12/2022    ECHOCARDIOGRAM REPORT   Patient Name:   [name] Date of Exam: 10/12/2022 Medical Rec #:  [id number]      Height:       67.0 in Accession #:    [id number]     Weight:       147.7 lb Date of Birth:  09-16-1940       BSA:          1.778 m Patient Age:    82 years       BP:           132/71 mmHg Patient Gender: F              HR:           65 bpm. Exam Location:  Inpatient Procedure: 2D Echo, Cardiac Doppler and Color Doppler Indications:    I48.92* Unspecified atrial flutter  History:        Patient has prior history of Echocardiogram examinations, most                 recent 05/08/2022. CHF; Risk Factors:Hypertension, Diabetes and                 Dyslipidemia.  Sonographer:    Irving Burton Senior RDCS Referring Phys: Clydie Braun IMPRESSIONS  1. Left ventricular ejection fraction, by estimation, is 60 to 65%. The left ventricle has normal function. The left ventricle has no regional wall motion abnormalities. There is mild left ventricular hypertrophy. Left ventricular diastolic parameters are consistent with Grade II diastolic dysfunction (pseudonormalization). Elevated left atrial pressure.  2. Right ventricular systolic function is normal. The right ventricular size is normal. There is mildly elevated pulmonary artery systolic pressure.  3. Left atrial size was mildly dilated.  4. Right atrial size was mildly dilated.  5. The mitral valve is normal in structure. Mild mitral valve regurgitation. No evidence of mitral stenosis.  6. The aortic valve is tricuspid. Aortic valve regurgitation is trivial. Aortic valve sclerosis is present, with no evidence of aortic valve stenosis.  7. The inferior vena cava is normal in size with greater than 50% respiratory variability, suggesting right atrial pressure of 3 mmHg. FINDINGS  Left Ventricle: Left ventricular ejection fraction, by estimation, is 60 to 65%. The left ventricle has normal  function. The left ventricle has no regional wall motion abnormalities. The left ventricular internal cavity size was normal in size. There is  mild left ventricular hypertrophy. Left ventricular diastolic parameters are consistent  Physician Discharge Summary   Patient: [name] MRN: [id number] DOB: Nov 29, 1940  Admit date:     10/11/2022  Discharge date: 10/16/22  Discharge Physician: Alba Cory   PCP: Merri Brunette, MD   Recommendations at discharge:  {Tip this will not be part of the note when signed- Example include specific recommendations for outpatient follow-up, pending tests to follow-up on. (Optional):26781}  ***  Discharge Diagnoses: Principal Problem:   Sepsis due to pneumonia [facility]) Active Problems:   UTI (urinary tract infection)   Paroxysmal atrial flutter (HCC)   Hyponatremia   AKI (acute kidney injury) (HCC)   Diarrhea   Diabetes mellitus type 2, noninsulin dependent (HCC)   Chronic diastolic CHF (congestive heart failure) (HCC)   Hypothyroidism   History of systemic lupus erythematosus (SLE) (HCC)   ILD (interstitial lung disease) (HCC)   Controlled type 2 diabetes mellitus without complication, without long-term current use of insulin (HCC)   Mixed hyperlipidemia   Benign hypertension   Acute febrile illness   Long term (current) use of anticoagulants  Resolved Problems:   * No resolved hospital problems. [facility] Course: No notes on file  Assessment and Plan: No notes have been filed under this hospital service. Service: Hospitalist     {Tip this will not be part of the note when signed Body mass index is 22.79 kg/m. , ,  (Optional):26781}  {(NOTE) Pain control PDMP Statment (Optional):26782} Consultants: *** Procedures performed: ***  Disposition: {Plan; Disposition:26390} Diet recommendation:  Discharge Diet Orders (From admission, onward)     Start     Ordered   10/16/22 0000  Diet - low sodium heart healthy        10/16/22 0914   10/15/22 0000  Diet - low sodium heart healthy        10/15/22 0911           {Diet_Plan:26776} DISCHARGE MEDICATION: Allergies as of 10/16/2022       Reactions   Ciprofloxacin Itching, Swelling, Other (See  Comments)   Possibly causing tremors?   Lisinopril Cough   Crestor [rosuvastatin] Other (See Comments)   Myalgia and back pain   Ofev [nintedanib] Nausea Only        Medication List     STOP taking these medications    benzonatate 100 MG capsule Commonly known as: TESSALON   bisoprolol 5 MG tablet Commonly known as: ZEBETA   fluticasone 50 MCG/ACT nasal spray Commonly known as: FLONASE   hydroxychloroquine 200 MG tablet Commonly known as: PLAQUENIL   ibuprofen 200 MG tablet Commonly known as: ADVIL   omeprazole 20 MG capsule Commonly known as: PRILOSEC       TAKE these medications    acidophilus Caps capsule Take 1 capsule by mouth 2 (two) times daily.   albuterol 108 (90 Base) MCG/ACT inhaler Commonly known as: VENTOLIN HFA Inhale 2 puffs into the lungs every 4 (four) hours as needed for wheezing or shortness of breath.   amLODipine 5 MG tablet Commonly known as: NORVASC TAKE ONE TABLET BY MOUTH EVERY EVENING   amoxicillin 500 MG capsule Commonly known as: AMOXIL Take 2 capsules (1,000 mg total) by mouth every 8 (eight) hours for 5 days.   apixaban 5 MG Tabs tablet Commonly known as: ELIQUIS Take 1 tablet (5 mg total) by mouth 2 (two) times daily.   dextromethorphan-guaiFENesin 30-600 MG 12hr tablet Commonly known as: MUCINEX DM Take 1 tablet by mouth 2 (two) times daily.   diclofenac Sodium  coronary arteries. Mild cardiomegaly. No pericardial effusion. Mediastinum/Nodes: Mildly enlarged mediastinal and right hilar lymph nodes, likely reactive. No axillary lymphadenopathy. Trachea within normal limits. Small hiatal hernia. Lungs/Pleura: Patchy airspace opacities within the bilateral lower lobes, right middle lobe, and right upper lobe. No pleural effusion or pneumothorax. Upper Abdomen: No acute abnormality. Musculoskeletal: Stable calcified mass in the medial left breast measuring up to 4.6 cm. No new or acute bony findings. Review of the MIP images confirms the above findings. IMPRESSION: 1. No evidence of pulmonary embolism. 2. Patchy airspace opacities within the bilateral lower lobes, right middle lobe, and right upper lobe, compatible with multifocal pneumonia. 3. Mildly enlarged mediastinal and right hilar lymph nodes, likely reactive. 4. Aortic and coronary artery atherosclerosis (ICD10-I70.0). 5. Stable calcified mass in the medial left breast measuring up to 4.6 cm. Correlate with prior mammographic workup. Electronically Signed   By: Duanne Guess  D.O.   On: 10/12/2022 17:51   ECHOCARDIOGRAM COMPLETE  Result Date: 10/12/2022    ECHOCARDIOGRAM REPORT   Patient Name:   [name] Date of Exam: 10/12/2022 Medical Rec #:  [id number]      Height:       67.0 in Accession #:    [id number]     Weight:       147.7 lb Date of Birth:  09-16-1940       BSA:          1.778 m Patient Age:    82 years       BP:           132/71 mmHg Patient Gender: F              HR:           65 bpm. Exam Location:  Inpatient Procedure: 2D Echo, Cardiac Doppler and Color Doppler Indications:    I48.92* Unspecified atrial flutter  History:        Patient has prior history of Echocardiogram examinations, most                 recent 05/08/2022. CHF; Risk Factors:Hypertension, Diabetes and                 Dyslipidemia.  Sonographer:    Irving Burton Senior RDCS Referring Phys: Clydie Braun IMPRESSIONS  1. Left ventricular ejection fraction, by estimation, is 60 to 65%. The left ventricle has normal function. The left ventricle has no regional wall motion abnormalities. There is mild left ventricular hypertrophy. Left ventricular diastolic parameters are consistent with Grade II diastolic dysfunction (pseudonormalization). Elevated left atrial pressure.  2. Right ventricular systolic function is normal. The right ventricular size is normal. There is mildly elevated pulmonary artery systolic pressure.  3. Left atrial size was mildly dilated.  4. Right atrial size was mildly dilated.  5. The mitral valve is normal in structure. Mild mitral valve regurgitation. No evidence of mitral stenosis.  6. The aortic valve is tricuspid. Aortic valve regurgitation is trivial. Aortic valve sclerosis is present, with no evidence of aortic valve stenosis.  7. The inferior vena cava is normal in size with greater than 50% respiratory variability, suggesting right atrial pressure of 3 mmHg. FINDINGS  Left Ventricle: Left ventricular ejection fraction, by estimation, is 60 to 65%. The left ventricle has normal  function. The left ventricle has no regional wall motion abnormalities. The left ventricular internal cavity size was normal in size. There is  mild left ventricular hypertrophy. Left ventricular diastolic parameters are consistent  Physician Discharge Summary   Patient: [name] MRN: [id number] DOB: Nov 29, 1940  Admit date:     10/11/2022  Discharge date: 10/16/22  Discharge Physician: Alba Cory   PCP: Merri Brunette, MD   Recommendations at discharge:  {Tip this will not be part of the note when signed- Example include specific recommendations for outpatient follow-up, pending tests to follow-up on. (Optional):26781}  ***  Discharge Diagnoses: Principal Problem:   Sepsis due to pneumonia [facility]) Active Problems:   UTI (urinary tract infection)   Paroxysmal atrial flutter (HCC)   Hyponatremia   AKI (acute kidney injury) (HCC)   Diarrhea   Diabetes mellitus type 2, noninsulin dependent (HCC)   Chronic diastolic CHF (congestive heart failure) (HCC)   Hypothyroidism   History of systemic lupus erythematosus (SLE) (HCC)   ILD (interstitial lung disease) (HCC)   Controlled type 2 diabetes mellitus without complication, without long-term current use of insulin (HCC)   Mixed hyperlipidemia   Benign hypertension   Acute febrile illness   Long term (current) use of anticoagulants  Resolved Problems:   * No resolved hospital problems. [facility] Course: No notes on file  Assessment and Plan: No notes have been filed under this hospital service. Service: Hospitalist     {Tip this will not be part of the note when signed Body mass index is 22.79 kg/m. , ,  (Optional):26781}  {(NOTE) Pain control PDMP Statment (Optional):26782} Consultants: *** Procedures performed: ***  Disposition: {Plan; Disposition:26390} Diet recommendation:  Discharge Diet Orders (From admission, onward)     Start     Ordered   10/16/22 0000  Diet - low sodium heart healthy        10/16/22 0914   10/15/22 0000  Diet - low sodium heart healthy        10/15/22 0911           {Diet_Plan:26776} DISCHARGE MEDICATION: Allergies as of 10/16/2022       Reactions   Ciprofloxacin Itching, Swelling, Other (See  Comments)   Possibly causing tremors?   Lisinopril Cough   Crestor [rosuvastatin] Other (See Comments)   Myalgia and back pain   Ofev [nintedanib] Nausea Only        Medication List     STOP taking these medications    benzonatate 100 MG capsule Commonly known as: TESSALON   bisoprolol 5 MG tablet Commonly known as: ZEBETA   fluticasone 50 MCG/ACT nasal spray Commonly known as: FLONASE   hydroxychloroquine 200 MG tablet Commonly known as: PLAQUENIL   ibuprofen 200 MG tablet Commonly known as: ADVIL   omeprazole 20 MG capsule Commonly known as: PRILOSEC       TAKE these medications    acidophilus Caps capsule Take 1 capsule by mouth 2 (two) times daily.   albuterol 108 (90 Base) MCG/ACT inhaler Commonly known as: VENTOLIN HFA Inhale 2 puffs into the lungs every 4 (four) hours as needed for wheezing or shortness of breath.   amLODipine 5 MG tablet Commonly known as: NORVASC TAKE ONE TABLET BY MOUTH EVERY EVENING   amoxicillin 500 MG capsule Commonly known as: AMOXIL Take 2 capsules (1,000 mg total) by mouth every 8 (eight) hours for 5 days.   apixaban 5 MG Tabs tablet Commonly known as: ELIQUIS Take 1 tablet (5 mg total) by mouth 2 (two) times daily.   dextromethorphan-guaiFENesin 30-600 MG 12hr tablet Commonly known as: MUCINEX DM Take 1 tablet by mouth 2 (two) times daily.   diclofenac Sodium  Physician Discharge Summary   Patient: [name] MRN: [id number] DOB: Nov 29, 1940  Admit date:     10/11/2022  Discharge date: 10/16/22  Discharge Physician: Alba Cory   PCP: Merri Brunette, MD   Recommendations at discharge:  {Tip this will not be part of the note when signed- Example include specific recommendations for outpatient follow-up, pending tests to follow-up on. (Optional):26781}  ***  Discharge Diagnoses: Principal Problem:   Sepsis due to pneumonia [facility]) Active Problems:   UTI (urinary tract infection)   Paroxysmal atrial flutter (HCC)   Hyponatremia   AKI (acute kidney injury) (HCC)   Diarrhea   Diabetes mellitus type 2, noninsulin dependent (HCC)   Chronic diastolic CHF (congestive heart failure) (HCC)   Hypothyroidism   History of systemic lupus erythematosus (SLE) (HCC)   ILD (interstitial lung disease) (HCC)   Controlled type 2 diabetes mellitus without complication, without long-term current use of insulin (HCC)   Mixed hyperlipidemia   Benign hypertension   Acute febrile illness   Long term (current) use of anticoagulants  Resolved Problems:   * No resolved hospital problems. [facility] Course: No notes on file  Assessment and Plan: No notes have been filed under this hospital service. Service: Hospitalist     {Tip this will not be part of the note when signed Body mass index is 22.79 kg/m. , ,  (Optional):26781}  {(NOTE) Pain control PDMP Statment (Optional):26782} Consultants: *** Procedures performed: ***  Disposition: {Plan; Disposition:26390} Diet recommendation:  Discharge Diet Orders (From admission, onward)     Start     Ordered   10/16/22 0000  Diet - low sodium heart healthy        10/16/22 0914   10/15/22 0000  Diet - low sodium heart healthy        10/15/22 0911           {Diet_Plan:26776} DISCHARGE MEDICATION: Allergies as of 10/16/2022       Reactions   Ciprofloxacin Itching, Swelling, Other (See  Comments)   Possibly causing tremors?   Lisinopril Cough   Crestor [rosuvastatin] Other (See Comments)   Myalgia and back pain   Ofev [nintedanib] Nausea Only        Medication List     STOP taking these medications    benzonatate 100 MG capsule Commonly known as: TESSALON   bisoprolol 5 MG tablet Commonly known as: ZEBETA   fluticasone 50 MCG/ACT nasal spray Commonly known as: FLONASE   hydroxychloroquine 200 MG tablet Commonly known as: PLAQUENIL   ibuprofen 200 MG tablet Commonly known as: ADVIL   omeprazole 20 MG capsule Commonly known as: PRILOSEC       TAKE these medications    acidophilus Caps capsule Take 1 capsule by mouth 2 (two) times daily.   albuterol 108 (90 Base) MCG/ACT inhaler Commonly known as: VENTOLIN HFA Inhale 2 puffs into the lungs every 4 (four) hours as needed for wheezing or shortness of breath.   amLODipine 5 MG tablet Commonly known as: NORVASC TAKE ONE TABLET BY MOUTH EVERY EVENING   amoxicillin 500 MG capsule Commonly known as: AMOXIL Take 2 capsules (1,000 mg total) by mouth every 8 (eight) hours for 5 days.   apixaban 5 MG Tabs tablet Commonly known as: ELIQUIS Take 1 tablet (5 mg total) by mouth 2 (two) times daily.   dextromethorphan-guaiFENesin 30-600 MG 12hr tablet Commonly known as: MUCINEX DM Take 1 tablet by mouth 2 (two) times daily.   diclofenac Sodium  Physician Discharge Summary   Patient: [name] MRN: [id number] DOB: Nov 29, 1940  Admit date:     10/11/2022  Discharge date: 10/16/22  Discharge Physician: Alba Cory   PCP: Merri Brunette, MD   Recommendations at discharge:  {Tip this will not be part of the note when signed- Example include specific recommendations for outpatient follow-up, pending tests to follow-up on. (Optional):26781}  ***  Discharge Diagnoses: Principal Problem:   Sepsis due to pneumonia [facility]) Active Problems:   UTI (urinary tract infection)   Paroxysmal atrial flutter (HCC)   Hyponatremia   AKI (acute kidney injury) (HCC)   Diarrhea   Diabetes mellitus type 2, noninsulin dependent (HCC)   Chronic diastolic CHF (congestive heart failure) (HCC)   Hypothyroidism   History of systemic lupus erythematosus (SLE) (HCC)   ILD (interstitial lung disease) (HCC)   Controlled type 2 diabetes mellitus without complication, without long-term current use of insulin (HCC)   Mixed hyperlipidemia   Benign hypertension   Acute febrile illness   Long term (current) use of anticoagulants  Resolved Problems:   * No resolved hospital problems. [facility] Course: No notes on file  Assessment and Plan: No notes have been filed under this hospital service. Service: Hospitalist     {Tip this will not be part of the note when signed Body mass index is 22.79 kg/m. , ,  (Optional):26781}  {(NOTE) Pain control PDMP Statment (Optional):26782} Consultants: *** Procedures performed: ***  Disposition: {Plan; Disposition:26390} Diet recommendation:  Discharge Diet Orders (From admission, onward)     Start     Ordered   10/16/22 0000  Diet - low sodium heart healthy        10/16/22 0914   10/15/22 0000  Diet - low sodium heart healthy        10/15/22 0911           {Diet_Plan:26776} DISCHARGE MEDICATION: Allergies as of 10/16/2022       Reactions   Ciprofloxacin Itching, Swelling, Other (See  Comments)   Possibly causing tremors?   Lisinopril Cough   Crestor [rosuvastatin] Other (See Comments)   Myalgia and back pain   Ofev [nintedanib] Nausea Only        Medication List     STOP taking these medications    benzonatate 100 MG capsule Commonly known as: TESSALON   bisoprolol 5 MG tablet Commonly known as: ZEBETA   fluticasone 50 MCG/ACT nasal spray Commonly known as: FLONASE   hydroxychloroquine 200 MG tablet Commonly known as: PLAQUENIL   ibuprofen 200 MG tablet Commonly known as: ADVIL   omeprazole 20 MG capsule Commonly known as: PRILOSEC       TAKE these medications    acidophilus Caps capsule Take 1 capsule by mouth 2 (two) times daily.   albuterol 108 (90 Base) MCG/ACT inhaler Commonly known as: VENTOLIN HFA Inhale 2 puffs into the lungs every 4 (four) hours as needed for wheezing or shortness of breath.   amLODipine 5 MG tablet Commonly known as: NORVASC TAKE ONE TABLET BY MOUTH EVERY EVENING   amoxicillin 500 MG capsule Commonly known as: AMOXIL Take 2 capsules (1,000 mg total) by mouth every 8 (eight) hours for 5 days.   apixaban 5 MG Tabs tablet Commonly known as: ELIQUIS Take 1 tablet (5 mg total) by mouth 2 (two) times daily.   dextromethorphan-guaiFENesin 30-600 MG 12hr tablet Commonly known as: MUCINEX DM Take 1 tablet by mouth 2 (two) times daily.   diclofenac Sodium  Physician Discharge Summary   Patient: [name] MRN: [id number] DOB: Nov 29, 1940  Admit date:     10/11/2022  Discharge date: 10/16/22  Discharge Physician: Alba Cory   PCP: Merri Brunette, MD   Recommendations at discharge:  {Tip this will not be part of the note when signed- Example include specific recommendations for outpatient follow-up, pending tests to follow-up on. (Optional):26781}  ***  Discharge Diagnoses: Principal Problem:   Sepsis due to pneumonia [facility]) Active Problems:   UTI (urinary tract infection)   Paroxysmal atrial flutter (HCC)   Hyponatremia   AKI (acute kidney injury) (HCC)   Diarrhea   Diabetes mellitus type 2, noninsulin dependent (HCC)   Chronic diastolic CHF (congestive heart failure) (HCC)   Hypothyroidism   History of systemic lupus erythematosus (SLE) (HCC)   ILD (interstitial lung disease) (HCC)   Controlled type 2 diabetes mellitus without complication, without long-term current use of insulin (HCC)   Mixed hyperlipidemia   Benign hypertension   Acute febrile illness   Long term (current) use of anticoagulants  Resolved Problems:   * No resolved hospital problems. [facility] Course: No notes on file  Assessment and Plan: No notes have been filed under this hospital service. Service: Hospitalist     {Tip this will not be part of the note when signed Body mass index is 22.79 kg/m. , ,  (Optional):26781}  {(NOTE) Pain control PDMP Statment (Optional):26782} Consultants: *** Procedures performed: ***  Disposition: {Plan; Disposition:26390} Diet recommendation:  Discharge Diet Orders (From admission, onward)     Start     Ordered   10/16/22 0000  Diet - low sodium heart healthy        10/16/22 0914   10/15/22 0000  Diet - low sodium heart healthy        10/15/22 0911           {Diet_Plan:26776} DISCHARGE MEDICATION: Allergies as of 10/16/2022       Reactions   Ciprofloxacin Itching, Swelling, Other (See  Comments)   Possibly causing tremors?   Lisinopril Cough   Crestor [rosuvastatin] Other (See Comments)   Myalgia and back pain   Ofev [nintedanib] Nausea Only        Medication List     STOP taking these medications    benzonatate 100 MG capsule Commonly known as: TESSALON   bisoprolol 5 MG tablet Commonly known as: ZEBETA   fluticasone 50 MCG/ACT nasal spray Commonly known as: FLONASE   hydroxychloroquine 200 MG tablet Commonly known as: PLAQUENIL   ibuprofen 200 MG tablet Commonly known as: ADVIL   omeprazole 20 MG capsule Commonly known as: PRILOSEC       TAKE these medications    acidophilus Caps capsule Take 1 capsule by mouth 2 (two) times daily.   albuterol 108 (90 Base) MCG/ACT inhaler Commonly known as: VENTOLIN HFA Inhale 2 puffs into the lungs every 4 (four) hours as needed for wheezing or shortness of breath.   amLODipine 5 MG tablet Commonly known as: NORVASC TAKE ONE TABLET BY MOUTH EVERY EVENING   amoxicillin 500 MG capsule Commonly known as: AMOXIL Take 2 capsules (1,000 mg total) by mouth every 8 (eight) hours for 5 days.   apixaban 5 MG Tabs tablet Commonly known as: ELIQUIS Take 1 tablet (5 mg total) by mouth 2 (two) times daily.   dextromethorphan-guaiFENesin 30-600 MG 12hr tablet Commonly known as: MUCINEX DM Take 1 tablet by mouth 2 (two) times daily.   diclofenac Sodium

## 2022-10-16 NOTE — Progress Notes (Signed)
Occupational Therapy Treatment Patient Details Name: [name] MRN: [id number] DOB: 1941/01/09 Today's Date: 10/16/2022   History of present illness Patient is an 82 y/o female admitted 10/11/22 with SOB, fever and cough.  Found to have multifocal pneumonia, strep pneumonia bacteremia. PMH positive for lupus, ILD, h/o breast cancer, PAF, hypothyroidism, DM2, hyperlipidemia, HTN, and frequent UTI's.   OT comments  Patient sitting in bedside chair upon arrival into room and agreeable to participate in OT intervention this date.  Patient completed sit to stand and functional mobility from chair to sink with Supervision.  Patient completed UB grooming/hygiene standing at sink with Supervision.  Patient also completed LB bathing/dressing at seated level at sink with Supervision s/p setup.  Patient is making progress  toward OT goals and seems to be functioning better with 02 during functional tasks with decreased fatigue.  Patient would benefit from additional OT intervention to address functional deficits in order for patient to return to PLOF.      If plan is discharge home, recommend the following:  A little help with bathing/dressing/bathroom;Assistance with cooking/housework;Assist for transportation;Direct supervision/assist for financial management   Equipment Recommendations  Tub/shower bench    Recommendations for Other Services      Precautions / Restrictions Precautions Precautions: Fall;Other (comment) Precaution Comments: watch SpO2 Restrictions Weight Bearing Restrictions: No       Mobility Bed Mobility Overal bed mobility:  (patient  sitting upright in bedside chair upon arrival into chair)                  Transfers Overall transfer level: Needs assistance Equipment used: None Transfers: Sit to/from Stand Sit to Stand: Supervision                 Balance Overall balance assessment: Needs assistance Sitting-balance support: No upper extremity  supported, Feet supported Sitting balance-Leahy Scale: Good                                     ADL either performed or assessed with clinical judgement   ADL Overall ADL's : Needs assistance/impaired Eating/Feeding: Independent;Sitting   Grooming: Wash/dry hands;Wash/dry face;Oral care;Set up;Supervision/safety;Standing       Lower Body Bathing: Set up;Supervison/ safety;Sit to/from stand       Lower Body Dressing: Supervision/safety;Set up               Functional mobility during ADLs: Supervision/safety      Extremity/Trunk Assessment Upper Extremity Assessment Upper Extremity Assessment: Overall WFL for tasks assessed       Cervical / Trunk Assessment Cervical / Trunk Assessment: Normal    Vision Baseline Vision/History: 1 Wears glasses Patient Visual Report: No change from baseline     Perception     Praxis      Cognition Arousal: Alert Behavior During Therapy: WFL for tasks assessed/performed Overall Cognitive Status: Within Functional Limits for tasks assessed                                          Exercises      Shoulder Instructions       General Comments Able to maintain SpO2 in 90s on 2L during activity.    Pertinent Vitals/ Pain       Pain Assessment Pain Assessment: No/denies pain  Home Living Family/patient expects  to be discharged to:: Private residence Living Arrangements: Children Available Help at Discharge: Family;Available 24 hours/day Type of Home: Apartment Home Access: Stairs to enter [employer] of Steps: flight Entrance Stairs-Rails: Right;Left Home Layout: One level     Bathroom Shower/Tub: Sponge bathes at baseline   Bathroom Toilet: Standard     Home Equipment: Cane - single point;BSC/3in1          Prior Functioning/Environment              Frequency  Min 1X/week        Progress Toward Goals  OT Goals(current goals can now be found in the care  plan section)     Acute Rehab OT Goals Time For Goal Achievement: 10/29/22 Potential to Achieve Goals: Good  Plan      Co-evaluation                 AM-PAC OT "6 Clicks" Daily Activity     Outcome Measure   Help from another person eating meals?: None Help from another person taking care of personal grooming?: A Little Help from another person toileting, which includes using toliet, bedpan, or urinal?: A Little Help from another person bathing (including washing, rinsing, drying)?: A Little Help from another person to put on and taking off regular upper body clothing?: None Help from another person to put on and taking off regular lower body clothing?: A Little 6 Click Score: 20    End of Session Equipment Utilized During Treatment: Oxygen (2 liters)  OT Visit Diagnosis: Unsteadiness on feet (R26.81);Muscle weakness (generalized) (M62.81)   Activity Tolerance Patient tolerated treatment well   Patient Left in chair;with call bell/phone within reach   Nurse Communication Mobility status        Time: 8413-2440 OT Time Calculation (min): 26 min  Charges: OT General Charges $OT Visit: 1 Visit OT Treatments $Self Care/Home Management : 23-37 mins  Governor Specking OT/L  Denice Paradise 10/16/2022, 11:42 AM

## 2022-10-19 LAB — CULTURE, BLOOD (SINGLE): Culture: NO GROWTH

## 2022-10-22 ENCOUNTER — Encounter: Payer: Self-pay | Admitting: Cardiology

## 2022-10-22 ENCOUNTER — Telehealth: Payer: Self-pay | Admitting: Cardiology

## 2022-10-22 NOTE — Telephone Encounter (Signed)
I sent her my chart message.  Congestive heart failure  Diastolic function means the heart has to relax to receive the blood so it can pump the blood out. If relaxation is impaired, then the blood coming to the heart is restricted and can cause dyspnea and reduced functional capacity and fluid build up. Exercise, weight loss, control of BP, salt restriction will improve this.   This can happen when you develop pneumonia, acute sickness.   This will resolve and would recommend  congestive heart failure: Weight yourself every day If weight increases by 2 to 3 pounds, use extra dose of water pill, if not resolved, call us If worsening shortness of breath, leg swelling, please call us Watch out for excessive fluid intake, salt intake, fried food and soups that may contain high amount of salt.

## 2022-10-22 NOTE — Telephone Encounter (Signed)
Daughter Casimer Bilis) stated patient was recently diagnosed with CHF and wants to know next steps.

## 2022-10-22 NOTE — Telephone Encounter (Signed)
Pt daughter states she is returning call

## 2022-10-22 NOTE — Telephone Encounter (Signed)
Left a message to call back.

## 2022-10-22 NOTE — Telephone Encounter (Signed)
Spoke with Pts daughter (per [facility] 2018 DPR as instructed could use this for this new pt population).  She would like better insight to why her mothers discharge paper work stated heart failure as they had never been told this before. She would like Dr Verl Dicker insight to this. Told [name] I would forward that to Dr. Jacinto Halim and someone will reach out to her once there is an answer. [name] stated understanding.

## 2022-10-23 NOTE — Telephone Encounter (Signed)
Patient's daughter and Dr. Jacinto Halim communicated via MyChart. 9/26-9/27.

## 2022-10-26 ENCOUNTER — Telehealth: Payer: Self-pay | Admitting: Internal Medicine

## 2022-10-26 ENCOUNTER — Telehealth: Payer: Self-pay | Admitting: Cardiology

## 2022-10-26 DIAGNOSIS — M359 Systemic involvement of connective tissue, unspecified: Secondary | ICD-10-CM

## 2022-10-26 NOTE — Telephone Encounter (Signed)
Liter Amt & PRN or continuous ?

## 2022-10-26 NOTE — Telephone Encounter (Signed)
I spoke with Rosanne Ashing and let him know patient does have a diagnosis of CHF

## 2022-10-26 NOTE — Telephone Encounter (Signed)
New Message:     Rosanne Ashing from [facility] called. He wants to know what is patient's diagnosis and does she have Congested Heart Failure?

## 2022-10-29 ENCOUNTER — Other Ambulatory Visit: Payer: Self-pay | Admitting: Internal Medicine

## 2022-10-29 ENCOUNTER — Ambulatory Visit: Payer: 59 | Admitting: Internal Medicine

## 2022-10-29 ENCOUNTER — Encounter: Payer: Self-pay | Admitting: Internal Medicine

## 2022-10-29 VITALS — BP 122/74 | HR 63 | Temp 98.3°F | Ht 67.0 in | Wt 148.4 lb

## 2022-10-29 DIAGNOSIS — J849 Interstitial pulmonary disease, unspecified: Secondary | ICD-10-CM | POA: Diagnosis not present

## 2022-10-29 DIAGNOSIS — R0609 Other forms of dyspnea: Secondary | ICD-10-CM

## 2022-10-29 DIAGNOSIS — Z862 Personal history of diseases of the blood and blood-forming organs and certain disorders involving the immune mechanism: Secondary | ICD-10-CM

## 2022-10-29 DIAGNOSIS — M329 Systemic lupus erythematosus, unspecified: Secondary | ICD-10-CM | POA: Diagnosis not present

## 2022-10-29 DIAGNOSIS — M359 Systemic involvement of connective tissue, unspecified: Secondary | ICD-10-CM

## 2022-10-29 DIAGNOSIS — Z23 Encounter for immunization: Secondary | ICD-10-CM

## 2022-10-29 DIAGNOSIS — J84112 Idiopathic pulmonary fibrosis: Secondary | ICD-10-CM

## 2022-10-29 DIAGNOSIS — Z8679 Personal history of other diseases of the circulatory system: Secondary | ICD-10-CM

## 2022-10-29 DIAGNOSIS — R5381 Other malaise: Secondary | ICD-10-CM

## 2022-10-29 DIAGNOSIS — J8489 Other specified interstitial pulmonary diseases: Secondary | ICD-10-CM

## 2022-10-29 DIAGNOSIS — Z09 Encounter for follow-up examination after completed treatment for conditions other than malignant neoplasm: Secondary | ICD-10-CM

## 2022-10-29 LAB — CBC WITH DIFFERENTIAL/PLATELET
Basophils Absolute: 0 10*3/uL (ref 0.0–0.1)
Basophils Relative: 0.8 % (ref 0.0–3.0)
Eosinophils Absolute: 0.1 10*3/uL (ref 0.0–0.7)
Eosinophils Relative: 1.6 % (ref 0.0–5.0)
HCT: 32.9 % — ABNORMAL LOW (ref 36.0–46.0)
Hemoglobin: 10.6 g/dL — ABNORMAL LOW (ref 12.0–15.0)
Lymphocytes Relative: 44.5 % (ref 12.0–46.0)
Lymphs Abs: 2.3 10*3/uL (ref 0.7–4.0)
MCHC: 32.4 g/dL (ref 30.0–36.0)
MCV: 91.1 fL (ref 78.0–100.0)
Monocytes Absolute: 0.4 10*3/uL (ref 0.1–1.0)
Monocytes Relative: 8.1 % (ref 3.0–12.0)
Neutro Abs: 2.4 10*3/uL (ref 1.4–7.7)
Neutrophils Relative %: 45 % (ref 43.0–77.0)
Platelets: 292 10*3/uL (ref 150.0–400.0)
RBC: 3.61 Mil/uL — ABNORMAL LOW (ref 3.87–5.11)
RDW: 14.8 % (ref 11.5–15.5)
WBC: 5.2 10*3/uL (ref 4.0–10.5)

## 2022-10-29 LAB — BASIC METABOLIC PANEL
BUN: 11 mg/dL (ref 6–23)
CO2: 28 meq/L (ref 19–32)
Calcium: 9.6 mg/dL (ref 8.4–10.5)
Chloride: 98 meq/L (ref 96–112)
Creatinine, Ser: 1.01 mg/dL (ref 0.40–1.20)
GFR: 52.58 mL/min — ABNORMAL LOW (ref >60.00)
Glucose, Bld: 123 mg/dL — ABNORMAL HIGH (ref 70–99)
Potassium: 3.9 meq/L (ref 3.5–5.1)
Sodium: 130 meq/L — ABNORMAL LOW (ref 135–145)

## 2022-10-29 LAB — HEPATIC FUNCTION PANEL
ALT: 10 U/L (ref 0–35)
AST: 18 U/L (ref 0–37)
Albumin: 3.9 g/dL (ref 3.5–5.2)
Alkaline Phosphatase: 68 U/L (ref 39–117)
Bilirubin, Direct: 0.1 mg/dL (ref 0.0–0.3)
Total Bilirubin: 0.4 mg/dL (ref 0.2–1.2)
Total Protein: 8.5 g/dL — ABNORMAL HIGH (ref 6.0–8.3)

## 2022-10-29 LAB — BRAIN NATRIURETIC PEPTIDE: Pro B Natriuretic peptide (BNP): 59 pg/mL (ref 0.0–100.0)

## 2022-10-29 NOTE — Addendum Note (Signed)
Addended by: Christen Butter on: 10/29/2022 03:23 PM   Modules accepted: Orders

## 2022-10-29 NOTE — Progress Notes (Signed)
committing to antifibrotic's particularly given her age and previous intolerance to nintedanib and having chronic kidney disease.    ECHO 05/08/22   IMPRESSIONS     1. Left ventricular ejection fraction, by estimation, is 60 to 65%. The  left ventricle has normal function. The left ventricle has no regional  wall motion abnormalities. There is mild concentric left ventricular  hypertrophy. Left ventricular diastolic  parameters are consistent with Grade I diastolic dysfunction (impaired  relaxation). Elevated left ventricular end-diastolic pressure.   2. Right ventricular systolic function is normal. The right ventricular  size is normal.   3. There is severe calcification involving the anterior mitral valve  leaflet that extends into the chordae tendinae. The mitral valve is  degenerative. Trivial mitral valve regurgitation. No evidence of mitral  stenosis.   4. The aortic valve is normal in structure. Aortic valve regurgitation is  not visualized. No aortic stenosis is present.   5. 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    OV 10/29/2022  Subjective:  Patient ID: [name], female , DOB: [birth year] , age 82 y.o. , MRN: [id number] , ADDRESS: [address] 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 Kathi Ludwig for lupus and is on Plaquenil.  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 Normal PA/RV and LV             - similar sept 2022  Hospitalization mid September 2024 due to sepsis related to UTI/pneumonia acute kidney injury, anemia and paroxysmal atrial flutter  10/29/2022 -   Chief Complaint  Patient presents with   Follow-up    Recent PNA 10/11/22. Breathing is doing well and she is not bothered by a cough.      HPI [name] 82 y.o. -returns for follow-up.  Presents with her daughter.  Daughter gives most of the history.  History also from review of the records.  Admitted for 4 days mid September 2024 due to sepsis related to UTI/pneumonia/acute kidney injury anemia and proximal atrial flutter.  Chart review shows elevation in BNP drop in hemoglobin.  Echocardiogram shows diastolic dysfunction.  She is deconditioned at discharge sent home on oxygen at discharge.  CT angio was negative for PE but suggestive of multifocal pneumonia..  Since then she is 75% better and is progressing well. Gets home physical therapy.  They not sure about the need for oxygen.  In fact today room air pulse ox at rest was 96%.  After sit/stand test 10 times she did  better. Shortness of breath, cough and wheezing have resolved. She has chronic dyspnea with moderate exertion. No shortness of breath at rest or with simple ADLs. She has a rare dry cough. No oxygen requirements.: OV with Clent Ridges NP for follow-up.  She had been seen by Dr. Daphane Shepherd 07/03/2021 and diagnosed with CAP.  She was treated with Augmentin and azithromycin.  Feeling better.  Shortness of breath, cough and wheezing have resolved.  She has chronic dyspnea with moderate exertion.   No increase in baseline.  No oxygen requirements.  Currently under surveillance not on Ofev.  Will follow-up with Dr. Marchelle Gearing in October and discuss restarting.  Patient ended up contacting the office on 8/23 and wanted to go ahead and move forward with restarting antifibrotic therapy - restarted Ofev.   12/17/2021: Today-acute visit Patient presents today for acute visit via virtual visit with her daughter.  A few people in her family tested positive for RSV around 2 weeks ago.  She started having similar symptoms with cough, fatigue, headaches and sinus congestion.  Her symptoms have improved some but she continues to have a productive cough with yellow phlegm and feels like she is having a lot of chest congestion.  She also feels slightly more short of breath compared to her baseline.  Still having some headaches and nasal congestion.  Denies any fevers, chills, hemoptysis, lower extremity swelling.  She is eating and drinking well.  Has not had any GI symptoms.  Has not been monitoring her oxygen levels.  She did go to urgent care on 11/11.  They swabbed her for COVID and flu which were both negative.  Did not perform any RSV testing.  No imaging was obtained.  She was discharged home with supportive care measures.   OV 01/13/2022  Subjective:  Patient ID: [name], female , DOB: [birth year] , age 84 y.o. , MRN: [id number] , ADDRESS: [address] [place] [address] PCP Merri Brunette, MD Patient Care Team: Merri Brunette, MD as PCP - General (Family Medicine) Zenovia Jordan, MD as Consulting Physician (Rheumatology)  This Provider for this visit: Treatment Team:  Attending Provider: Kalman Shan, MD  01/13/2022 -   Chief Complaint  Patient presents with   Follow-up    PFT performed today.  Pt states she is about the same since last visit. States she has been sick twice recently.     HPI [name] 82 y.o. -returns for follow-up.  After my last visit she did  call in and said that she was open to taking antifibrotic's.  We started this in October 2023.  However she took only for few days and then stopped because of abdominal pain and nausea and other GI side effects.  Her daughter Casimer Bilis advised her also not to take the medicines after reading the side effects.  She tells me today that she does not want to go back on taking nintedanib.She tells me overall she is stable for the year of 2023.  Daughter wanted know what other options there.  We discussed pirfenidone.  Daughter was interested in patient taking pirfenidone but patient rejected this idea because of significant GI side effects even with pirfenidone.  Daughter was also interested in knowing about other possible therapeutic options.  Explained to her that only nintedanib and pirfenidone exist.  Everything else outside this would be a clinical trial.  There are some upcoming trials with inhaled pirfenidone and other agents.  Discussed the concept of clinical trials.  They are  better. Shortness of breath, cough and wheezing have resolved. She has chronic dyspnea with moderate exertion. No shortness of breath at rest or with simple ADLs. She has a rare dry cough. No oxygen requirements.: OV with Clent Ridges NP for follow-up.  She had been seen by Dr. Daphane Shepherd 07/03/2021 and diagnosed with CAP.  She was treated with Augmentin and azithromycin.  Feeling better.  Shortness of breath, cough and wheezing have resolved.  She has chronic dyspnea with moderate exertion.   No increase in baseline.  No oxygen requirements.  Currently under surveillance not on Ofev.  Will follow-up with Dr. Marchelle Gearing in October and discuss restarting.  Patient ended up contacting the office on 8/23 and wanted to go ahead and move forward with restarting antifibrotic therapy - restarted Ofev.   12/17/2021: Today-acute visit Patient presents today for acute visit via virtual visit with her daughter.  A few people in her family tested positive for RSV around 2 weeks ago.  She started having similar symptoms with cough, fatigue, headaches and sinus congestion.  Her symptoms have improved some but she continues to have a productive cough with yellow phlegm and feels like she is having a lot of chest congestion.  She also feels slightly more short of breath compared to her baseline.  Still having some headaches and nasal congestion.  Denies any fevers, chills, hemoptysis, lower extremity swelling.  She is eating and drinking well.  Has not had any GI symptoms.  Has not been monitoring her oxygen levels.  She did go to urgent care on 11/11.  They swabbed her for COVID and flu which were both negative.  Did not perform any RSV testing.  No imaging was obtained.  She was discharged home with supportive care measures.   OV 01/13/2022  Subjective:  Patient ID: [name], female , DOB: [birth year] , age 84 y.o. , MRN: [id number] , ADDRESS: [address] [place] [address] PCP Merri Brunette, MD Patient Care Team: Merri Brunette, MD as PCP - General (Family Medicine) Zenovia Jordan, MD as Consulting Physician (Rheumatology)  This Provider for this visit: Treatment Team:  Attending Provider: Kalman Shan, MD  01/13/2022 -   Chief Complaint  Patient presents with   Follow-up    PFT performed today.  Pt states she is about the same since last visit. States she has been sick twice recently.     HPI [name] 82 y.o. -returns for follow-up.  After my last visit she did  call in and said that she was open to taking antifibrotic's.  We started this in October 2023.  However she took only for few days and then stopped because of abdominal pain and nausea and other GI side effects.  Her daughter Casimer Bilis advised her also not to take the medicines after reading the side effects.  She tells me today that she does not want to go back on taking nintedanib.She tells me overall she is stable for the year of 2023.  Daughter wanted know what other options there.  We discussed pirfenidone.  Daughter was interested in patient taking pirfenidone but patient rejected this idea because of significant GI side effects even with pirfenidone.  Daughter was also interested in knowing about other possible therapeutic options.  Explained to her that only nintedanib and pirfenidone exist.  Everything else outside this would be a clinical trial.  There are some upcoming trials with inhaled pirfenidone and other agents.  Discussed the concept of clinical trials.  They are  processing this information.     Daughter was interested in knowing the natural history of pulmonary fibrosis and the future prognosis.  Did explain progressive phenotype and the likelihood of ending up on oxygen class III-4 dyspnea in the future.   Of note the daughter states she got exposed to kids with RSV and has been fatigued this was right before Thanksgiving 2023.  Currently patient feels fine but the daughter feels she is more fatigued.  Walking desaturation test shows that she is more fatigued and she cannot do her 3 labs but there is no change in the heart rate n or pulse ox compared to the past.  She denies any fever or chills or sputum production or worsening cough.      OV 03/26/2022  Subjective:  Patient ID: [name], female , DOB: 1940-10-15 , age 86 y.o. , MRN: [id number] , ADDRESS: [address] [place] [address] PCP Merri Brunette, MD Patient Care Team: Merri Brunette, MD as PCP - General  (Family Medicine) Zenovia Jordan, MD as Consulting Physician (Rheumatology)  This Provider for this visit: Treatment Team:  Attending Provider: Kalman Shan, MD   Chief Complaint  Patient presents with   Follow-up    SOB seems worse.  Hard to breathe at times.  Needs albuterol MDI.      HPI [name] 82 y.o. -returns for follow-up for the above issue.  Initially she said she was doing stable but I think continue to talk to her she said that she might be feeling a little more short of breath on exertion relieved by rest than before.  She then said that was just a single episode 2 weeks ago when she walked to the truck she got quite short of breath and had to stop but then it resolved and she feels baseline but in evaluation of the symptom score sheet her symptoms are much worse than previous visit in December 2023.  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  And she had previously failed nintedanib and she already has slowly progressive disease.  The technician CMA walked her today for single pulse oximetry she was not sure about the accuracy and the reading but it appears patient might be desaturating.  Her last CT scan was in fall 2022.  She was supposed to have a CT scan at this visit but I am unclear why this has not happened and patient's not sure either.  Patient does have does not want to have pulmonary function test.  Her last echocardiogram was also in September 2022.  She is at risk candidate for pulmonary hypertension.  She denies any fever or chills or nausea vomiting or cough or wheezing no changes in medicines no new medical issues.  She also has baseline anemia and this has not been rechecked in a while.  Based on test review.   CT Chest data - HRCT OCt 2022  Narrative & Impression  CLINICAL DATA:  Interstitial lung disease, dyspnea on exertion, history of left breast cancer and radiation   EXAM: CT CHEST WITHOUT CONTRAST    TECHNIQUE: Multidetector CT imaging of the chest was performed following the standard protocol without intravenous contrast. High resolution imaging of the lungs, as well as inspiratory and expiratory imaging, was performed.   COMPARISON:  07/27/2019 02/11/2018, 02/15/2017, 07/22/2016   FINDINGS: Cardiovascular: Aortic atherosclerosis. Normal heart size. Three-vessel coronary artery calcifications. No pericardial effusion.   Mediastinum/Nodes: No enlarged mediastinal, hilar, or axillary  better. Shortness of breath, cough and wheezing have resolved. She has chronic dyspnea with moderate exertion. No shortness of breath at rest or with simple ADLs. She has a rare dry cough. No oxygen requirements.: OV with Clent Ridges NP for follow-up.  She had been seen by Dr. Daphane Shepherd 07/03/2021 and diagnosed with CAP.  She was treated with Augmentin and azithromycin.  Feeling better.  Shortness of breath, cough and wheezing have resolved.  She has chronic dyspnea with moderate exertion.   No increase in baseline.  No oxygen requirements.  Currently under surveillance not on Ofev.  Will follow-up with Dr. Marchelle Gearing in October and discuss restarting.  Patient ended up contacting the office on 8/23 and wanted to go ahead and move forward with restarting antifibrotic therapy - restarted Ofev.   12/17/2021: Today-acute visit Patient presents today for acute visit via virtual visit with her daughter.  A few people in her family tested positive for RSV around 2 weeks ago.  She started having similar symptoms with cough, fatigue, headaches and sinus congestion.  Her symptoms have improved some but she continues to have a productive cough with yellow phlegm and feels like she is having a lot of chest congestion.  She also feels slightly more short of breath compared to her baseline.  Still having some headaches and nasal congestion.  Denies any fevers, chills, hemoptysis, lower extremity swelling.  She is eating and drinking well.  Has not had any GI symptoms.  Has not been monitoring her oxygen levels.  She did go to urgent care on 11/11.  They swabbed her for COVID and flu which were both negative.  Did not perform any RSV testing.  No imaging was obtained.  She was discharged home with supportive care measures.   OV 01/13/2022  Subjective:  Patient ID: [name], female , DOB: [birth year] , age 84 y.o. , MRN: [id number] , ADDRESS: [address] [place] [address] PCP Merri Brunette, MD Patient Care Team: Merri Brunette, MD as PCP - General (Family Medicine) Zenovia Jordan, MD as Consulting Physician (Rheumatology)  This Provider for this visit: Treatment Team:  Attending Provider: Kalman Shan, MD  01/13/2022 -   Chief Complaint  Patient presents with   Follow-up    PFT performed today.  Pt states she is about the same since last visit. States she has been sick twice recently.     HPI [name] 82 y.o. -returns for follow-up.  After my last visit she did  call in and said that she was open to taking antifibrotic's.  We started this in October 2023.  However she took only for few days and then stopped because of abdominal pain and nausea and other GI side effects.  Her daughter Casimer Bilis advised her also not to take the medicines after reading the side effects.  She tells me today that she does not want to go back on taking nintedanib.She tells me overall she is stable for the year of 2023.  Daughter wanted know what other options there.  We discussed pirfenidone.  Daughter was interested in patient taking pirfenidone but patient rejected this idea because of significant GI side effects even with pirfenidone.  Daughter was also interested in knowing about other possible therapeutic options.  Explained to her that only nintedanib and pirfenidone exist.  Everything else outside this would be a clinical trial.  There are some upcoming trials with inhaled pirfenidone and other agents.  Discussed the concept of clinical trials.  They are  lymph nodes. Small hiatal hernia. Thyroid gland, trachea, and esophagus demonstrate no significant findings.   Lungs/Pleura: Redemonstrated moderate pulmonary fibrosis in a pattern with apical to basal gradient, featuring irregular peripheral interstitial opacity, septal thickening, traction bronchiectasis, subpleural bronchiolectasis, and areas of honeycombing at the lung bases. Fibrotic findings are slightly worsened compared to prior examination dated 07/27/2019 and clearly worsened over time on examinations dating back to 07/22/2016. No significant air trapping on expiratory phase imaging. No pleural effusion or pneumothorax.   Upper Abdomen: No acute abnormality.   Musculoskeletal: Calcified mass of the medial left breast, unchanged (series 2, image 89). No suspicious bone lesions identified.   IMPRESSION: 1. Redemonstrated moderate pulmonary fibrosis in a pattern with apical to basal gradient, featuring irregular peripheral interstitial opacity, septal thickening, traction bronchiectasis, subpleural bronchiolectasis, and areas of honeycombing at the lung bases. Fibrotic findings are slightly worsened compared to prior examination dated 07/27/2019 and clearly worsened over time on examinations dating back to 07/22/2016. Findings are consistent with UIP per consensus guidelines: Diagnosis of Idiopathic Pulmonary Fibrosis: An Official ATS/ERS/JRS/ALAT Clinical Practice Guideline. Am Rosezetta Schlatter Crit Care Med Vol 198, Iss 5, [id number], Sep 26 2016. 2. Coronary artery disease.   Aortic Atherosclerosis (ICD10-I70.0).     Electronically Signed   By: Jearld Lesch M.D.   On: 11/11/2020 09:12     OV 06/01/2022  Subjective:  Patient ID: [name], female , DOB: 1941-01-02 , age 87 y.o. , MRN: [id number] , ADDRESS: [address] [place] [address] PCP Merri Brunette, MD Patient Care Team: Merri Brunette, MD as PCP - General (Family Medicine) Zenovia Jordan, MD as Consulting Physician (Rheumatology)  This Provider for this visit: Treatment Team:  Attending Provider: Kalman Shan, MD    06/01/2022 -   Chief Complaint  Patient presents with   Follow-up    F/up on Echo, HRCT. HRCT not done.         03/26/2022 -    HPI KINLEIGH NAULT 82 y.o. -returns for follow-up.9 history is gained from talking to the patient but later also from talking to the daughter.  At first the patient states she was feeling great.  She says after her recent hospitalization late March 2024 through early April 2024 for dehydration related to UTI and presyncope she is actually doing better.  She is going dancing.  Her shortness of breath is actually improved and energy levels are better.)  But later when I called the daughter the daughter said the whole reason the appointment was made with [place] because she is having worsening cough.  Then the patient later admitted the cough is indeed worse and she has some clear sputum but the patient also said that the cough got worse during the hospital stay [CT suggested some pneumonia] but is currently improving and getting better.  Daughter denied cough is better. Denied dysphagia. There was some mold exposure in the houseThere is no edema there is no orthopnea no paroxysmal nocturnal dyspnea.  Admitted to a sit/stand test x 12 times.  She did not get dyspneic and she did not desaturate.  The results are below.  She was supposed to have a  high-resolution CT chest but this did not happen because of the hospitalization and her having a CT angio chest.  Will proceed today and get chest x-ray and labs  At this point in time I think symptoms are stable exercise hypoxemia is absent.  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 Shortness of breath, cough and wheezing have resolved. She has chronic dyspnea with moderate exertion. No shortness of breath at rest or with simple ADLs. She has a rare dry cough. No oxygen requirements.: OV with Clent Ridges NP for follow-up.  She had been seen by Dr. Daphane Shepherd 07/03/2021 and diagnosed with CAP.  She was treated with Augmentin and azithromycin.  Feeling better.  Shortness of breath, cough and wheezing have resolved.  She has chronic dyspnea with moderate exertion.   No increase in baseline.  No oxygen requirements.  Currently under surveillance not on Ofev.  Will follow-up with Dr. Marchelle Gearing in October and discuss restarting.  Patient ended up contacting the office on 8/23 and wanted to go ahead and move forward with restarting antifibrotic therapy - restarted Ofev.   12/17/2021: Today-acute visit Patient presents today for acute visit via virtual visit with her daughter.  A few people in her family tested positive for RSV around 2 weeks ago.  She started having similar symptoms with cough, fatigue, headaches and sinus congestion.  Her symptoms have improved some but she continues to have a productive cough with yellow phlegm and feels like she is having a lot of chest congestion.  She also feels slightly more short of breath compared to her baseline.  Still having some headaches and nasal congestion.  Denies any fevers, chills, hemoptysis, lower extremity swelling.  She is eating and drinking well.  Has not had any GI symptoms.  Has not been monitoring her oxygen levels.  She did go to urgent care on 11/11.  They swabbed her for COVID and flu which were both negative.  Did not perform any RSV testing.  No imaging was obtained.  She was discharged home with supportive care measures.   OV 01/13/2022  Subjective:  Patient ID: [name], female , DOB: [birth year] , age 84 y.o. , MRN: [id number] , ADDRESS: [address] [place] [address] PCP Merri Brunette, MD Patient Care Team: Merri Brunette, MD as PCP - General (Family Medicine) Zenovia Jordan, MD as Consulting Physician (Rheumatology)  This Provider for this visit: Treatment Team:  Attending Provider: Kalman Shan, MD  01/13/2022 -   Chief Complaint  Patient presents with   Follow-up    PFT performed today.  Pt states she is about the same since last visit. States she has been sick twice recently.     HPI [name] 82 y.o. -returns for follow-up.  After my last visit she did  call in and said that she was open to taking antifibrotic's.  We started this in October 2023.  However she took only for few days and then stopped because of abdominal pain and nausea and other GI side effects.  Her daughter Casimer Bilis advised her also not to take the medicines after reading the side effects.  She tells me today that she does not want to go back on taking nintedanib.She tells me overall she is stable for the year of 2023.  Daughter wanted know what other options there.  We discussed pirfenidone.  Daughter was interested in patient taking pirfenidone but patient rejected this idea because of significant GI side effects even with pirfenidone.  Daughter was also interested in knowing about other possible therapeutic options.  Explained to her that only nintedanib and pirfenidone exist.  Everything else outside this would be a clinical trial.  There are some upcoming trials with inhaled pirfenidone and other agents.  Discussed the concept of clinical trials.  They are  committing to antifibrotic's particularly given her age and previous intolerance to nintedanib and having chronic kidney disease.    ECHO 05/08/22   IMPRESSIONS     1. Left ventricular ejection fraction, by estimation, is 60 to 65%. The  left ventricle has normal function. The left ventricle has no regional  wall motion abnormalities. There is mild concentric left ventricular  hypertrophy. Left ventricular diastolic  parameters are consistent with Grade I diastolic dysfunction (impaired  relaxation). Elevated left ventricular end-diastolic pressure.   2. Right ventricular systolic function is normal. The right ventricular  size is normal.   3. There is severe calcification involving the anterior mitral valve  leaflet that extends into the chordae tendinae. The mitral valve is  degenerative. Trivial mitral valve regurgitation. No evidence of mitral  stenosis.   4. The aortic valve is normal in structure. Aortic valve regurgitation is  not visualized. No aortic stenosis is present.   5. 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    OV 10/29/2022  Subjective:  Patient ID: [name], female , DOB: [birth year] , age 82 y.o. , MRN: [id number] , ADDRESS: [address] 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 Kathi Ludwig for lupus and is on Plaquenil.  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 Normal PA/RV and LV             - similar sept 2022  Hospitalization mid September 2024 due to sepsis related to UTI/pneumonia acute kidney injury, anemia and paroxysmal atrial flutter  10/29/2022 -   Chief Complaint  Patient presents with   Follow-up    Recent PNA 10/11/22. Breathing is doing well and she is not bothered by a cough.      HPI [name] 82 y.o. -returns for follow-up.  Presents with her daughter.  Daughter gives most of the history.  History also from review of the records.  Admitted for 4 days mid September 2024 due to sepsis related to UTI/pneumonia/acute kidney injury anemia and proximal atrial flutter.  Chart review shows elevation in BNP drop in hemoglobin.  Echocardiogram shows diastolic dysfunction.  She is deconditioned at discharge sent home on oxygen at discharge.  CT angio was negative for PE but suggestive of multifocal pneumonia..  Since then she is 75% better and is progressing well. Gets home physical therapy.  They not sure about the need for oxygen.  In fact today room air pulse ox at rest was 96%.  After sit/stand test 10 times she did  committing to antifibrotic's particularly given her age and previous intolerance to nintedanib and having chronic kidney disease.    ECHO 05/08/22   IMPRESSIONS     1. Left ventricular ejection fraction, by estimation, is 60 to 65%. The  left ventricle has normal function. The left ventricle has no regional  wall motion abnormalities. There is mild concentric left ventricular  hypertrophy. Left ventricular diastolic  parameters are consistent with Grade I diastolic dysfunction (impaired  relaxation). Elevated left ventricular end-diastolic pressure.   2. Right ventricular systolic function is normal. The right ventricular  size is normal.   3. There is severe calcification involving the anterior mitral valve  leaflet that extends into the chordae tendinae. The mitral valve is  degenerative. Trivial mitral valve regurgitation. No evidence of mitral  stenosis.   4. The aortic valve is normal in structure. Aortic valve regurgitation is  not visualized. No aortic stenosis is present.   5. 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    OV 10/29/2022  Subjective:  Patient ID: [name], female , DOB: [birth year] , age 82 y.o. , MRN: [id number] , ADDRESS: [address] 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 Kathi Ludwig for lupus and is on Plaquenil.  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 Normal PA/RV and LV             - similar sept 2022  Hospitalization mid September 2024 due to sepsis related to UTI/pneumonia acute kidney injury, anemia and paroxysmal atrial flutter  10/29/2022 -   Chief Complaint  Patient presents with   Follow-up    Recent PNA 10/11/22. Breathing is doing well and she is not bothered by a cough.      HPI [name] 82 y.o. -returns for follow-up.  Presents with her daughter.  Daughter gives most of the history.  History also from review of the records.  Admitted for 4 days mid September 2024 due to sepsis related to UTI/pneumonia/acute kidney injury anemia and proximal atrial flutter.  Chart review shows elevation in BNP drop in hemoglobin.  Echocardiogram shows diastolic dysfunction.  She is deconditioned at discharge sent home on oxygen at discharge.  CT angio was negative for PE but suggestive of multifocal pneumonia..  Since then she is 75% better and is progressing well. Gets home physical therapy.  They not sure about the need for oxygen.  In fact today room air pulse ox at rest was 96%.  After sit/stand test 10 times she did  processing this information.     Daughter was interested in knowing the natural history of pulmonary fibrosis and the future prognosis.  Did explain progressive phenotype and the likelihood of ending up on oxygen class III-4 dyspnea in the future.   Of note the daughter states she got exposed to kids with RSV and has been fatigued this was right before Thanksgiving 2023.  Currently patient feels fine but the daughter feels she is more fatigued.  Walking desaturation test shows that she is more fatigued and she cannot do her 3 labs but there is no change in the heart rate n or pulse ox compared to the past.  She denies any fever or chills or sputum production or worsening cough.      OV 03/26/2022  Subjective:  Patient ID: [name], female , DOB: 1940-10-15 , age 86 y.o. , MRN: [id number] , ADDRESS: [address] [place] [address] PCP Merri Brunette, MD Patient Care Team: Merri Brunette, MD as PCP - General  (Family Medicine) Zenovia Jordan, MD as Consulting Physician (Rheumatology)  This Provider for this visit: Treatment Team:  Attending Provider: Kalman Shan, MD   Chief Complaint  Patient presents with   Follow-up    SOB seems worse.  Hard to breathe at times.  Needs albuterol MDI.      HPI [name] 82 y.o. -returns for follow-up for the above issue.  Initially she said she was doing stable but I think continue to talk to her she said that she might be feeling a little more short of breath on exertion relieved by rest than before.  She then said that was just a single episode 2 weeks ago when she walked to the truck she got quite short of breath and had to stop but then it resolved and she feels baseline but in evaluation of the symptom score sheet her symptoms are much worse than previous visit in December 2023.  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  And she had previously failed nintedanib and she already has slowly progressive disease.  The technician CMA walked her today for single pulse oximetry she was not sure about the accuracy and the reading but it appears patient might be desaturating.  Her last CT scan was in fall 2022.  She was supposed to have a CT scan at this visit but I am unclear why this has not happened and patient's not sure either.  Patient does have does not want to have pulmonary function test.  Her last echocardiogram was also in September 2022.  She is at risk candidate for pulmonary hypertension.  She denies any fever or chills or nausea vomiting or cough or wheezing no changes in medicines no new medical issues.  She also has baseline anemia and this has not been rechecked in a while.  Based on test review.   CT Chest data - HRCT OCt 2022  Narrative & Impression  CLINICAL DATA:  Interstitial lung disease, dyspnea on exertion, history of left breast cancer and radiation   EXAM: CT CHEST WITHOUT CONTRAST    TECHNIQUE: Multidetector CT imaging of the chest was performed following the standard protocol without intravenous contrast. High resolution imaging of the lungs, as well as inspiratory and expiratory imaging, was performed.   COMPARISON:  07/27/2019 02/11/2018, 02/15/2017, 07/22/2016   FINDINGS: Cardiovascular: Aortic atherosclerosis. Normal heart size. Three-vessel coronary artery calcifications. No pericardial effusion.   Mediastinum/Nodes: No enlarged mediastinal, hilar, or axillary  better. Shortness of breath, cough and wheezing have resolved. She has chronic dyspnea with moderate exertion. No shortness of breath at rest or with simple ADLs. She has a rare dry cough. No oxygen requirements.: OV with Clent Ridges NP for follow-up.  She had been seen by Dr. Daphane Shepherd 07/03/2021 and diagnosed with CAP.  She was treated with Augmentin and azithromycin.  Feeling better.  Shortness of breath, cough and wheezing have resolved.  She has chronic dyspnea with moderate exertion.   No increase in baseline.  No oxygen requirements.  Currently under surveillance not on Ofev.  Will follow-up with Dr. Marchelle Gearing in October and discuss restarting.  Patient ended up contacting the office on 8/23 and wanted to go ahead and move forward with restarting antifibrotic therapy - restarted Ofev.   12/17/2021: Today-acute visit Patient presents today for acute visit via virtual visit with her daughter.  A few people in her family tested positive for RSV around 2 weeks ago.  She started having similar symptoms with cough, fatigue, headaches and sinus congestion.  Her symptoms have improved some but she continues to have a productive cough with yellow phlegm and feels like she is having a lot of chest congestion.  She also feels slightly more short of breath compared to her baseline.  Still having some headaches and nasal congestion.  Denies any fevers, chills, hemoptysis, lower extremity swelling.  She is eating and drinking well.  Has not had any GI symptoms.  Has not been monitoring her oxygen levels.  She did go to urgent care on 11/11.  They swabbed her for COVID and flu which were both negative.  Did not perform any RSV testing.  No imaging was obtained.  She was discharged home with supportive care measures.   OV 01/13/2022  Subjective:  Patient ID: [name], female , DOB: [birth year] , age 84 y.o. , MRN: [id number] , ADDRESS: [address] [place] [address] PCP Merri Brunette, MD Patient Care Team: Merri Brunette, MD as PCP - General (Family Medicine) Zenovia Jordan, MD as Consulting Physician (Rheumatology)  This Provider for this visit: Treatment Team:  Attending Provider: Kalman Shan, MD  01/13/2022 -   Chief Complaint  Patient presents with   Follow-up    PFT performed today.  Pt states she is about the same since last visit. States she has been sick twice recently.     HPI [name] 82 y.o. -returns for follow-up.  After my last visit she did  call in and said that she was open to taking antifibrotic's.  We started this in October 2023.  However she took only for few days and then stopped because of abdominal pain and nausea and other GI side effects.  Her daughter Casimer Bilis advised her also not to take the medicines after reading the side effects.  She tells me today that she does not want to go back on taking nintedanib.She tells me overall she is stable for the year of 2023.  Daughter wanted know what other options there.  We discussed pirfenidone.  Daughter was interested in patient taking pirfenidone but patient rejected this idea because of significant GI side effects even with pirfenidone.  Daughter was also interested in knowing about other possible therapeutic options.  Explained to her that only nintedanib and pirfenidone exist.  Everything else outside this would be a clinical trial.  There are some upcoming trials with inhaled pirfenidone and other agents.  Discussed the concept of clinical trials.  They are  better. Shortness of breath, cough and wheezing have resolved. She has chronic dyspnea with moderate exertion. No shortness of breath at rest or with simple ADLs. She has a rare dry cough. No oxygen requirements.: OV with Clent Ridges NP for follow-up.  She had been seen by Dr. Daphane Shepherd 07/03/2021 and diagnosed with CAP.  She was treated with Augmentin and azithromycin.  Feeling better.  Shortness of breath, cough and wheezing have resolved.  She has chronic dyspnea with moderate exertion.   No increase in baseline.  No oxygen requirements.  Currently under surveillance not on Ofev.  Will follow-up with Dr. Marchelle Gearing in October and discuss restarting.  Patient ended up contacting the office on 8/23 and wanted to go ahead and move forward with restarting antifibrotic therapy - restarted Ofev.   12/17/2021: Today-acute visit Patient presents today for acute visit via virtual visit with her daughter.  A few people in her family tested positive for RSV around 2 weeks ago.  She started having similar symptoms with cough, fatigue, headaches and sinus congestion.  Her symptoms have improved some but she continues to have a productive cough with yellow phlegm and feels like she is having a lot of chest congestion.  She also feels slightly more short of breath compared to her baseline.  Still having some headaches and nasal congestion.  Denies any fevers, chills, hemoptysis, lower extremity swelling.  She is eating and drinking well.  Has not had any GI symptoms.  Has not been monitoring her oxygen levels.  She did go to urgent care on 11/11.  They swabbed her for COVID and flu which were both negative.  Did not perform any RSV testing.  No imaging was obtained.  She was discharged home with supportive care measures.   OV 01/13/2022  Subjective:  Patient ID: [name], female , DOB: [birth year] , age 84 y.o. , MRN: [id number] , ADDRESS: [address] [place] [address] PCP Merri Brunette, MD Patient Care Team: Merri Brunette, MD as PCP - General (Family Medicine) Zenovia Jordan, MD as Consulting Physician (Rheumatology)  This Provider for this visit: Treatment Team:  Attending Provider: Kalman Shan, MD  01/13/2022 -   Chief Complaint  Patient presents with   Follow-up    PFT performed today.  Pt states she is about the same since last visit. States she has been sick twice recently.     HPI [name] 82 y.o. -returns for follow-up.  After my last visit she did  call in and said that she was open to taking antifibrotic's.  We started this in October 2023.  However she took only for few days and then stopped because of abdominal pain and nausea and other GI side effects.  Her daughter Casimer Bilis advised her also not to take the medicines after reading the side effects.  She tells me today that she does not want to go back on taking nintedanib.She tells me overall she is stable for the year of 2023.  Daughter wanted know what other options there.  We discussed pirfenidone.  Daughter was interested in patient taking pirfenidone but patient rejected this idea because of significant GI side effects even with pirfenidone.  Daughter was also interested in knowing about other possible therapeutic options.  Explained to her that only nintedanib and pirfenidone exist.  Everything else outside this would be a clinical trial.  There are some upcoming trials with inhaled pirfenidone and other agents.  Discussed the concept of clinical trials.  They are  processing this information.     Daughter was interested in knowing the natural history of pulmonary fibrosis and the future prognosis.  Did explain progressive phenotype and the likelihood of ending up on oxygen class III-4 dyspnea in the future.   Of note the daughter states she got exposed to kids with RSV and has been fatigued this was right before Thanksgiving 2023.  Currently patient feels fine but the daughter feels she is more fatigued.  Walking desaturation test shows that she is more fatigued and she cannot do her 3 labs but there is no change in the heart rate n or pulse ox compared to the past.  She denies any fever or chills or sputum production or worsening cough.      OV 03/26/2022  Subjective:  Patient ID: [name], female , DOB: 1940-10-15 , age 86 y.o. , MRN: [id number] , ADDRESS: [address] [place] [address] PCP Merri Brunette, MD Patient Care Team: Merri Brunette, MD as PCP - General  (Family Medicine) Zenovia Jordan, MD as Consulting Physician (Rheumatology)  This Provider for this visit: Treatment Team:  Attending Provider: Kalman Shan, MD   Chief Complaint  Patient presents with   Follow-up    SOB seems worse.  Hard to breathe at times.  Needs albuterol MDI.      HPI [name] 82 y.o. -returns for follow-up for the above issue.  Initially she said she was doing stable but I think continue to talk to her she said that she might be feeling a little more short of breath on exertion relieved by rest than before.  She then said that was just a single episode 2 weeks ago when she walked to the truck she got quite short of breath and had to stop but then it resolved and she feels baseline but in evaluation of the symptom score sheet her symptoms are much worse than previous visit in December 2023.  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  And she had previously failed nintedanib and she already has slowly progressive disease.  The technician CMA walked her today for single pulse oximetry she was not sure about the accuracy and the reading but it appears patient might be desaturating.  Her last CT scan was in fall 2022.  She was supposed to have a CT scan at this visit but I am unclear why this has not happened and patient's not sure either.  Patient does have does not want to have pulmonary function test.  Her last echocardiogram was also in September 2022.  She is at risk candidate for pulmonary hypertension.  She denies any fever or chills or nausea vomiting or cough or wheezing no changes in medicines no new medical issues.  She also has baseline anemia and this has not been rechecked in a while.  Based on test review.   CT Chest data - HRCT OCt 2022  Narrative & Impression  CLINICAL DATA:  Interstitial lung disease, dyspnea on exertion, history of left breast cancer and radiation   EXAM: CT CHEST WITHOUT CONTRAST    TECHNIQUE: Multidetector CT imaging of the chest was performed following the standard protocol without intravenous contrast. High resolution imaging of the lungs, as well as inspiratory and expiratory imaging, was performed.   COMPARISON:  07/27/2019 02/11/2018, 02/15/2017, 07/22/2016   FINDINGS: Cardiovascular: Aortic atherosclerosis. Normal heart size. Three-vessel coronary artery calcifications. No pericardial effusion.   Mediastinum/Nodes: No enlarged mediastinal, hilar, or axillary  processing this information.     Daughter was interested in knowing the natural history of pulmonary fibrosis and the future prognosis.  Did explain progressive phenotype and the likelihood of ending up on oxygen class III-4 dyspnea in the future.   Of note the daughter states she got exposed to kids with RSV and has been fatigued this was right before Thanksgiving 2023.  Currently patient feels fine but the daughter feels she is more fatigued.  Walking desaturation test shows that she is more fatigued and she cannot do her 3 labs but there is no change in the heart rate n or pulse ox compared to the past.  She denies any fever or chills or sputum production or worsening cough.      OV 03/26/2022  Subjective:  Patient ID: [name], female , DOB: 1940-10-15 , age 86 y.o. , MRN: [id number] , ADDRESS: [address] [place] [address] PCP Merri Brunette, MD Patient Care Team: Merri Brunette, MD as PCP - General  (Family Medicine) Zenovia Jordan, MD as Consulting Physician (Rheumatology)  This Provider for this visit: Treatment Team:  Attending Provider: Kalman Shan, MD   Chief Complaint  Patient presents with   Follow-up    SOB seems worse.  Hard to breathe at times.  Needs albuterol MDI.      HPI [name] 82 y.o. -returns for follow-up for the above issue.  Initially she said she was doing stable but I think continue to talk to her she said that she might be feeling a little more short of breath on exertion relieved by rest than before.  She then said that was just a single episode 2 weeks ago when she walked to the truck she got quite short of breath and had to stop but then it resolved and she feels baseline but in evaluation of the symptom score sheet her symptoms are much worse than previous visit in December 2023.  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  And she had previously failed nintedanib and she already has slowly progressive disease.  The technician CMA walked her today for single pulse oximetry she was not sure about the accuracy and the reading but it appears patient might be desaturating.  Her last CT scan was in fall 2022.  She was supposed to have a CT scan at this visit but I am unclear why this has not happened and patient's not sure either.  Patient does have does not want to have pulmonary function test.  Her last echocardiogram was also in September 2022.  She is at risk candidate for pulmonary hypertension.  She denies any fever or chills or nausea vomiting or cough or wheezing no changes in medicines no new medical issues.  She also has baseline anemia and this has not been rechecked in a while.  Based on test review.   CT Chest data - HRCT OCt 2022  Narrative & Impression  CLINICAL DATA:  Interstitial lung disease, dyspnea on exertion, history of left breast cancer and radiation   EXAM: CT CHEST WITHOUT CONTRAST    TECHNIQUE: Multidetector CT imaging of the chest was performed following the standard protocol without intravenous contrast. High resolution imaging of the lungs, as well as inspiratory and expiratory imaging, was performed.   COMPARISON:  07/27/2019 02/11/2018, 02/15/2017, 07/22/2016   FINDINGS: Cardiovascular: Aortic atherosclerosis. Normal heart size. Three-vessel coronary artery calcifications. No pericardial effusion.   Mediastinum/Nodes: No enlarged mediastinal, hilar, or axillary  lymph nodes. Small hiatal hernia. Thyroid gland, trachea, and esophagus demonstrate no significant findings.   Lungs/Pleura: Redemonstrated moderate pulmonary fibrosis in a pattern with apical to basal gradient, featuring irregular peripheral interstitial opacity, septal thickening, traction bronchiectasis, subpleural bronchiolectasis, and areas of honeycombing at the lung bases. Fibrotic findings are slightly worsened compared to prior examination dated 07/27/2019 and clearly worsened over time on examinations dating back to 07/22/2016. No significant air trapping on expiratory phase imaging. No pleural effusion or pneumothorax.   Upper Abdomen: No acute abnormality.   Musculoskeletal: Calcified mass of the medial left breast, unchanged (series 2, image 89). No suspicious bone lesions identified.   IMPRESSION: 1. Redemonstrated moderate pulmonary fibrosis in a pattern with apical to basal gradient, featuring irregular peripheral interstitial opacity, septal thickening, traction bronchiectasis, subpleural bronchiolectasis, and areas of honeycombing at the lung bases. Fibrotic findings are slightly worsened compared to prior examination dated 07/27/2019 and clearly worsened over time on examinations dating back to 07/22/2016. Findings are consistent with UIP per consensus guidelines: Diagnosis of Idiopathic Pulmonary Fibrosis: An Official ATS/ERS/JRS/ALAT Clinical Practice Guideline. Am Rosezetta Schlatter Crit Care Med Vol 198, Iss 5, [id number], Sep 26 2016. 2. Coronary artery disease.   Aortic Atherosclerosis (ICD10-I70.0).     Electronically Signed   By: Jearld Lesch M.D.   On: 11/11/2020 09:12     OV 06/01/2022  Subjective:  Patient ID: [name], female , DOB: 1941-01-02 , age 87 y.o. , MRN: [id number] , ADDRESS: [address] [place] [address] PCP Merri Brunette, MD Patient Care Team: Merri Brunette, MD as PCP - General (Family Medicine) Zenovia Jordan, MD as Consulting Physician (Rheumatology)  This Provider for this visit: Treatment Team:  Attending Provider: Kalman Shan, MD    06/01/2022 -   Chief Complaint  Patient presents with   Follow-up    F/up on Echo, HRCT. HRCT not done.         03/26/2022 -    HPI KINLEIGH NAULT 82 y.o. -returns for follow-up.9 history is gained from talking to the patient but later also from talking to the daughter.  At first the patient states she was feeling great.  She says after her recent hospitalization late March 2024 through early April 2024 for dehydration related to UTI and presyncope she is actually doing better.  She is going dancing.  Her shortness of breath is actually improved and energy levels are better.)  But later when I called the daughter the daughter said the whole reason the appointment was made with [place] because she is having worsening cough.  Then the patient later admitted the cough is indeed worse and she has some clear sputum but the patient also said that the cough got worse during the hospital stay [CT suggested some pneumonia] but is currently improving and getting better.  Daughter denied cough is better. Denied dysphagia. There was some mold exposure in the houseThere is no edema there is no orthopnea no paroxysmal nocturnal dyspnea.  Admitted to a sit/stand test x 12 times.  She did not get dyspneic and she did not desaturate.  The results are below.  She was supposed to have a  high-resolution CT chest but this did not happen because of the hospitalization and her having a CT angio chest.  Will proceed today and get chest x-ray and labs  At this point in time I think symptoms are stable exercise hypoxemia is absent.  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 Shortness of breath, cough and wheezing have resolved. She has chronic dyspnea with moderate exertion. No shortness of breath at rest or with simple ADLs. She has a rare dry cough. No oxygen requirements.: OV with Clent Ridges NP for follow-up.  She had been seen by Dr. Daphane Shepherd 07/03/2021 and diagnosed with CAP.  She was treated with Augmentin and azithromycin.  Feeling better.  Shortness of breath, cough and wheezing have resolved.  She has chronic dyspnea with moderate exertion.   No increase in baseline.  No oxygen requirements.  Currently under surveillance not on Ofev.  Will follow-up with Dr. Marchelle Gearing in October and discuss restarting.  Patient ended up contacting the office on 8/23 and wanted to go ahead and move forward with restarting antifibrotic therapy - restarted Ofev.   12/17/2021: Today-acute visit Patient presents today for acute visit via virtual visit with her daughter.  A few people in her family tested positive for RSV around 2 weeks ago.  She started having similar symptoms with cough, fatigue, headaches and sinus congestion.  Her symptoms have improved some but she continues to have a productive cough with yellow phlegm and feels like she is having a lot of chest congestion.  She also feels slightly more short of breath compared to her baseline.  Still having some headaches and nasal congestion.  Denies any fevers, chills, hemoptysis, lower extremity swelling.  She is eating and drinking well.  Has not had any GI symptoms.  Has not been monitoring her oxygen levels.  She did go to urgent care on 11/11.  They swabbed her for COVID and flu which were both negative.  Did not perform any RSV testing.  No imaging was obtained.  She was discharged home with supportive care measures.   OV 01/13/2022  Subjective:  Patient ID: [name], female , DOB: [birth year] , age 84 y.o. , MRN: [id number] , ADDRESS: [address] [place] [address] PCP Merri Brunette, MD Patient Care Team: Merri Brunette, MD as PCP - General (Family Medicine) Zenovia Jordan, MD as Consulting Physician (Rheumatology)  This Provider for this visit: Treatment Team:  Attending Provider: Kalman Shan, MD  01/13/2022 -   Chief Complaint  Patient presents with   Follow-up    PFT performed today.  Pt states she is about the same since last visit. States she has been sick twice recently.     HPI [name] 82 y.o. -returns for follow-up.  After my last visit she did  call in and said that she was open to taking antifibrotic's.  We started this in October 2023.  However she took only for few days and then stopped because of abdominal pain and nausea and other GI side effects.  Her daughter Casimer Bilis advised her also not to take the medicines after reading the side effects.  She tells me today that she does not want to go back on taking nintedanib.She tells me overall she is stable for the year of 2023.  Daughter wanted know what other options there.  We discussed pirfenidone.  Daughter was interested in patient taking pirfenidone but patient rejected this idea because of significant GI side effects even with pirfenidone.  Daughter was also interested in knowing about other possible therapeutic options.  Explained to her that only nintedanib and pirfenidone exist.  Everything else outside this would be a clinical trial.  There are some upcoming trials with inhaled pirfenidone and other agents.  Discussed the concept of clinical trials.  They are  lymph nodes. Small hiatal hernia. Thyroid gland, trachea, and esophagus demonstrate no significant findings.   Lungs/Pleura: Redemonstrated moderate pulmonary fibrosis in a pattern with apical to basal gradient, featuring irregular peripheral interstitial opacity, septal thickening, traction bronchiectasis, subpleural bronchiolectasis, and areas of honeycombing at the lung bases. Fibrotic findings are slightly worsened compared to prior examination dated 07/27/2019 and clearly worsened over time on examinations dating back to 07/22/2016. No significant air trapping on expiratory phase imaging. No pleural effusion or pneumothorax.   Upper Abdomen: No acute abnormality.   Musculoskeletal: Calcified mass of the medial left breast, unchanged (series 2, image 89). No suspicious bone lesions identified.   IMPRESSION: 1. Redemonstrated moderate pulmonary fibrosis in a pattern with apical to basal gradient, featuring irregular peripheral interstitial opacity, septal thickening, traction bronchiectasis, subpleural bronchiolectasis, and areas of honeycombing at the lung bases. Fibrotic findings are slightly worsened compared to prior examination dated 07/27/2019 and clearly worsened over time on examinations dating back to 07/22/2016. Findings are consistent with UIP per consensus guidelines: Diagnosis of Idiopathic Pulmonary Fibrosis: An Official ATS/ERS/JRS/ALAT Clinical Practice Guideline. Am Rosezetta Schlatter Crit Care Med Vol 198, Iss 5, [id number], Sep 26 2016. 2. Coronary artery disease.   Aortic Atherosclerosis (ICD10-I70.0).     Electronically Signed   By: Jearld Lesch M.D.   On: 11/11/2020 09:12     OV 06/01/2022  Subjective:  Patient ID: [name], female , DOB: 1941-01-02 , age 87 y.o. , MRN: [id number] , ADDRESS: [address] [place] [address] PCP Merri Brunette, MD Patient Care Team: Merri Brunette, MD as PCP - General (Family Medicine) Zenovia Jordan, MD as Consulting Physician (Rheumatology)  This Provider for this visit: Treatment Team:  Attending Provider: Kalman Shan, MD    06/01/2022 -   Chief Complaint  Patient presents with   Follow-up    F/up on Echo, HRCT. HRCT not done.         03/26/2022 -    HPI KINLEIGH NAULT 82 y.o. -returns for follow-up.9 history is gained from talking to the patient but later also from talking to the daughter.  At first the patient states she was feeling great.  She says after her recent hospitalization late March 2024 through early April 2024 for dehydration related to UTI and presyncope she is actually doing better.  She is going dancing.  Her shortness of breath is actually improved and energy levels are better.)  But later when I called the daughter the daughter said the whole reason the appointment was made with [place] because she is having worsening cough.  Then the patient later admitted the cough is indeed worse and she has some clear sputum but the patient also said that the cough got worse during the hospital stay [CT suggested some pneumonia] but is currently improving and getting better.  Daughter denied cough is better. Denied dysphagia. There was some mold exposure in the houseThere is no edema there is no orthopnea no paroxysmal nocturnal dyspnea.  Admitted to a sit/stand test x 12 times.  She did not get dyspneic and she did not desaturate.  The results are below.  She was supposed to have a  high-resolution CT chest but this did not happen because of the hospitalization and her having a CT angio chest.  Will proceed today and get chest x-ray and labs  At this point in time I think symptoms are stable exercise hypoxemia is absent.  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 Shortness of breath, cough and wheezing have resolved. She has chronic dyspnea with moderate exertion. No shortness of breath at rest or with simple ADLs. She has a rare dry cough. No oxygen requirements.: OV with Clent Ridges NP for follow-up.  She had been seen by Dr. Daphane Shepherd 07/03/2021 and diagnosed with CAP.  She was treated with Augmentin and azithromycin.  Feeling better.  Shortness of breath, cough and wheezing have resolved.  She has chronic dyspnea with moderate exertion.   No increase in baseline.  No oxygen requirements.  Currently under surveillance not on Ofev.  Will follow-up with Dr. Marchelle Gearing in October and discuss restarting.  Patient ended up contacting the office on 8/23 and wanted to go ahead and move forward with restarting antifibrotic therapy - restarted Ofev.   12/17/2021: Today-acute visit Patient presents today for acute visit via virtual visit with her daughter.  A few people in her family tested positive for RSV around 2 weeks ago.  She started having similar symptoms with cough, fatigue, headaches and sinus congestion.  Her symptoms have improved some but she continues to have a productive cough with yellow phlegm and feels like she is having a lot of chest congestion.  She also feels slightly more short of breath compared to her baseline.  Still having some headaches and nasal congestion.  Denies any fevers, chills, hemoptysis, lower extremity swelling.  She is eating and drinking well.  Has not had any GI symptoms.  Has not been monitoring her oxygen levels.  She did go to urgent care on 11/11.  They swabbed her for COVID and flu which were both negative.  Did not perform any RSV testing.  No imaging was obtained.  She was discharged home with supportive care measures.   OV 01/13/2022  Subjective:  Patient ID: [name], female , DOB: [birth year] , age 84 y.o. , MRN: [id number] , ADDRESS: [address] [place] [address] PCP Merri Brunette, MD Patient Care Team: Merri Brunette, MD as PCP - General (Family Medicine) Zenovia Jordan, MD as Consulting Physician (Rheumatology)  This Provider for this visit: Treatment Team:  Attending Provider: Kalman Shan, MD  01/13/2022 -   Chief Complaint  Patient presents with   Follow-up    PFT performed today.  Pt states she is about the same since last visit. States she has been sick twice recently.     HPI [name] 82 y.o. -returns for follow-up.  After my last visit she did  call in and said that she was open to taking antifibrotic's.  We started this in October 2023.  However she took only for few days and then stopped because of abdominal pain and nausea and other GI side effects.  Her daughter Casimer Bilis advised her also not to take the medicines after reading the side effects.  She tells me today that she does not want to go back on taking nintedanib.She tells me overall she is stable for the year of 2023.  Daughter wanted know what other options there.  We discussed pirfenidone.  Daughter was interested in patient taking pirfenidone but patient rejected this idea because of significant GI side effects even with pirfenidone.  Daughter was also interested in knowing about other possible therapeutic options.  Explained to her that only nintedanib and pirfenidone exist.  Everything else outside this would be a clinical trial.  There are some upcoming trials with inhaled pirfenidone and other agents.  Discussed the concept of clinical trials.  They are  lymph nodes. Small hiatal hernia. Thyroid gland, trachea, and esophagus demonstrate no significant findings.   Lungs/Pleura: Redemonstrated moderate pulmonary fibrosis in a pattern with apical to basal gradient, featuring irregular peripheral interstitial opacity, septal thickening, traction bronchiectasis, subpleural bronchiolectasis, and areas of honeycombing at the lung bases. Fibrotic findings are slightly worsened compared to prior examination dated 07/27/2019 and clearly worsened over time on examinations dating back to 07/22/2016. No significant air trapping on expiratory phase imaging. No pleural effusion or pneumothorax.   Upper Abdomen: No acute abnormality.   Musculoskeletal: Calcified mass of the medial left breast, unchanged (series 2, image 89). No suspicious bone lesions identified.   IMPRESSION: 1. Redemonstrated moderate pulmonary fibrosis in a pattern with apical to basal gradient, featuring irregular peripheral interstitial opacity, septal thickening, traction bronchiectasis, subpleural bronchiolectasis, and areas of honeycombing at the lung bases. Fibrotic findings are slightly worsened compared to prior examination dated 07/27/2019 and clearly worsened over time on examinations dating back to 07/22/2016. Findings are consistent with UIP per consensus guidelines: Diagnosis of Idiopathic Pulmonary Fibrosis: An Official ATS/ERS/JRS/ALAT Clinical Practice Guideline. Am Rosezetta Schlatter Crit Care Med Vol 198, Iss 5, [id number], Sep 26 2016. 2. Coronary artery disease.   Aortic Atherosclerosis (ICD10-I70.0).     Electronically Signed   By: Jearld Lesch M.D.   On: 11/11/2020 09:12     OV 06/01/2022  Subjective:  Patient ID: [name], female , DOB: 1941-01-02 , age 87 y.o. , MRN: [id number] , ADDRESS: [address] [place] [address] PCP Merri Brunette, MD Patient Care Team: Merri Brunette, MD as PCP - General (Family Medicine) Zenovia Jordan, MD as Consulting Physician (Rheumatology)  This Provider for this visit: Treatment Team:  Attending Provider: Kalman Shan, MD    06/01/2022 -   Chief Complaint  Patient presents with   Follow-up    F/up on Echo, HRCT. HRCT not done.         03/26/2022 -    HPI KINLEIGH NAULT 82 y.o. -returns for follow-up.9 history is gained from talking to the patient but later also from talking to the daughter.  At first the patient states she was feeling great.  She says after her recent hospitalization late March 2024 through early April 2024 for dehydration related to UTI and presyncope she is actually doing better.  She is going dancing.  Her shortness of breath is actually improved and energy levels are better.)  But later when I called the daughter the daughter said the whole reason the appointment was made with [place] because she is having worsening cough.  Then the patient later admitted the cough is indeed worse and she has some clear sputum but the patient also said that the cough got worse during the hospital stay [CT suggested some pneumonia] but is currently improving and getting better.  Daughter denied cough is better. Denied dysphagia. There was some mold exposure in the houseThere is no edema there is no orthopnea no paroxysmal nocturnal dyspnea.  Admitted to a sit/stand test x 12 times.  She did not get dyspneic and she did not desaturate.  The results are below.  She was supposed to have a  high-resolution CT chest but this did not happen because of the hospitalization and her having a CT angio chest.  Will proceed today and get chest x-ray and labs  At this point in time I think symptoms are stable exercise hypoxemia is absent.  I think it is best we continue to monitor ILD before

## 2022-10-29 NOTE — Patient Instructions (Addendum)
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 ILD (interstitial lung disease) (HCC)  J84.9 Ambulatory Referral for DME    3. UIP (usual interstitial pneumonitis) (HCC)  J84.112     4. History of systemic lupus erythematosus (SLE) (HCC)  M32.9     5. Hospital discharge follow-up  Z09     6. History of diastolic dysfunction  Z86.79     7. History of anemia  Z86.2     8. Flu vaccine need  Z23     9. Physical deconditioning  R53.81       Glad you are improved 75% from your recent hospitalization.  At this point in time I do not think you need oxygen at rest or with simple exercise.   Plan  -High-dose flu shot today -Do blood work for CBC, chemistry, liver function test, BNP  -We will call with the results -Return daytime oxygen - Test for overnight pulse oximetry on room air and if abnormal you will need to continue overnight oxygen - do spirometry and dlco in 3 months     Follow-up-  Call or return sooner if cough worsens/changes/does not resolve 3 mnths to see DR Marchelle Gearing  - might need discussion on esbriet or immuran or cellcept if there is progresion  -Symptom score and exercise Hypoxemia test at follow-up  - decide on HRCT at followup

## 2022-10-30 ENCOUNTER — Telehealth: Payer: Self-pay | Admitting: Internal Medicine

## 2022-10-30 NOTE — Telephone Encounter (Signed)
Per DME company "This patient is not eligible for a new machine, i have called her to see if she is having any problems or if she needs supplies. No answer I left a vm"

## 2022-11-02 NOTE — Telephone Encounter (Signed)
Waiting on results of overnight study.

## 2022-11-04 ENCOUNTER — Telehealth: Payer: Self-pay | Admitting: Internal Medicine

## 2022-11-04 NOTE — Telephone Encounter (Signed)
I do not see the albuterol if it has been called in

## 2022-11-04 NOTE — Telephone Encounter (Signed)
PT calling about not getting her nebulizer machine from [facility] yet and the ONO equipment has not been picked up by [facility] yet. Also, AVS states she needs Albuterol but she has not got a call on that from her [facility]  Please call PT she is older and needs assurance. [phone]

## 2022-11-06 NOTE — Telephone Encounter (Signed)
I tried calling [name] but her mailbox if full and I couldn't leave a message. I called her daughter [name]. She stated they moved back in June 2024 and when they were cleaning out the storage building her neb machine got broke and that is why she needs new neb machine

## 2022-11-06 NOTE — Telephone Encounter (Signed)
Per Cyndi Lennert with [facility] the patient just got neb machine 06/2021. I tried calling Mrs Napoleon but her mailbox if full and I couldn't leave a message. I called her daughter Casimer Bilis. She stated they moved back in June 2024 and when they were cleaning out the storage building her neb machine got broke and that is why she needs new neb machine. Casimer Bilis stated the ONO was to be picked up on 11/04/22 but [facility] didn't pick up the device until 11/05/22

## 2022-11-06 NOTE — Telephone Encounter (Signed)
I spoke with [name] with [facility] again and explained what the daughter told me about the neb machine. Kathryn spoke with the [facility] office manager and they will be giving her a new neb machine this time and she hasn't been getting any neb meds from them since 12/2021. The RT will be making visit to her home to check in with the patient

## 2022-11-10 ENCOUNTER — Other Ambulatory Visit: Payer: Self-pay | Admitting: Physician Assistant

## 2022-11-10 DIAGNOSIS — R921 Mammographic calcification found on diagnostic imaging of breast: Secondary | ICD-10-CM

## 2022-11-11 ENCOUNTER — Telehealth: Payer: Self-pay | Admitting: Internal Medicine

## 2022-11-11 NOTE — Telephone Encounter (Signed)
Pt was sent a whole lot of oxygen tank but she has her own tanks and she doesn't need the tanks provided

## 2022-11-16 NOTE — Telephone Encounter (Signed)
ATC patient-unable to leave vm due to mailbox being full.  Will call back.  

## 2022-11-18 ENCOUNTER — Telehealth: Payer: Self-pay | Admitting: Internal Medicine

## 2022-11-18 DIAGNOSIS — J8489 Other specified interstitial pulmonary diseases: Secondary | ICD-10-CM

## 2022-11-18 DIAGNOSIS — M359 Systemic involvement of connective tissue, unspecified: Secondary | ICD-10-CM

## 2022-11-18 DIAGNOSIS — J849 Interstitial pulmonary disease, unspecified: Secondary | ICD-10-CM

## 2022-11-18 NOTE — Telephone Encounter (Signed)
Called and spoke to patient.she stated that she has 8 small oxygen Concentrator that she would like picked up. She has a home filled system and a large back up tank. She wears nocturnal oxygen only.    MR, please advise if okay to place order?

## 2022-11-18 NOTE — Telephone Encounter (Signed)
Needs 02 tanks to be picked up. Please call pt and advise if she needs order to cancel 02. Her # is [phone]

## 2022-11-18 NOTE — Telephone Encounter (Signed)
Called the pt and there was no answer and no option to leave msg  Closing encounter per protocol  She should call whoever the DME is about tanks if she has issue with what they dispensed

## 2022-11-19 NOTE — Telephone Encounter (Signed)
Order placed to [facility] to d/c 8 small tanks.   ATC patient- unable to leave vm due to mailbox not being setup.

## 2022-11-19 NOTE — Telephone Encounter (Signed)
This is fine 

## 2022-11-20 NOTE — Telephone Encounter (Signed)
ATC x2 for patient- unable to leave vm due to mailbox being full. Will close encounter per office protocol.

## 2022-12-01 NOTE — Telephone Encounter (Signed)
Ono 11/02/22 - no desats  Ok to return night o3

## 2022-12-04 NOTE — Addendum Note (Signed)
Addended by: Christen Butter on: 12/04/2022 01:29 PM   Modules accepted: Orders

## 2022-12-04 NOTE — Telephone Encounter (Signed)
Pt aware of response per MR  Nothing further needed Order to DME to d/c o2

## 2022-12-28 ENCOUNTER — Telehealth: Payer: Self-pay | Admitting: Internal Medicine

## 2023-01-04 NOTE — Telephone Encounter (Signed)
[facility] will reach out to pickup additional tanks. Per [facility] they did p/u portable but not concentrator. ATC patient no vmail.

## 2023-01-07 ENCOUNTER — Encounter: Payer: Self-pay | Admitting: Emergency Medicine

## 2023-01-07 ENCOUNTER — Other Ambulatory Visit: Payer: Self-pay | Admitting: Physician Assistant

## 2023-01-07 ENCOUNTER — Other Ambulatory Visit: Payer: Self-pay

## 2023-01-07 ENCOUNTER — Ambulatory Visit: Payer: 59 | Attending: Physician Assistant | Admitting: Emergency Medicine

## 2023-01-07 VITALS — BP 110/70 | HR 75 | Ht 67.0 in | Wt 149.0 lb

## 2023-01-07 DIAGNOSIS — E785 Hyperlipidemia, unspecified: Secondary | ICD-10-CM

## 2023-01-07 DIAGNOSIS — M3211 Endocarditis in systemic lupus erythematosus: Secondary | ICD-10-CM | POA: Diagnosis not present

## 2023-01-07 DIAGNOSIS — I10 Essential (primary) hypertension: Secondary | ICD-10-CM

## 2023-01-07 DIAGNOSIS — I2510 Atherosclerotic heart disease of native coronary artery without angina pectoris: Secondary | ICD-10-CM | POA: Diagnosis not present

## 2023-01-07 DIAGNOSIS — I483 Typical atrial flutter: Secondary | ICD-10-CM

## 2023-01-07 DIAGNOSIS — Z794 Long term (current) use of insulin: Secondary | ICD-10-CM | POA: Diagnosis not present

## 2023-01-07 DIAGNOSIS — I6523 Occlusion and stenosis of bilateral carotid arteries: Secondary | ICD-10-CM | POA: Diagnosis not present

## 2023-01-07 DIAGNOSIS — E119 Type 2 diabetes mellitus without complications: Secondary | ICD-10-CM

## 2023-01-07 DIAGNOSIS — R002 Palpitations: Secondary | ICD-10-CM | POA: Diagnosis not present

## 2023-01-07 DIAGNOSIS — R0609 Other forms of dyspnea: Secondary | ICD-10-CM | POA: Diagnosis not present

## 2023-01-07 MED ORDER — DILTIAZEM HCL ER COATED BEADS 120 MG PO CP24: 120.0000 mg | ORAL_CAPSULE | Freq: Every day | ORAL | 3 refills | Status: DC

## 2023-01-07 MED ORDER — SPIRONOLACTONE 25 MG PO TABS: 12.5000 mg | ORAL_TABLET | Freq: Every day | ORAL | 3 refills | Status: DC

## 2023-01-07 NOTE — Progress Notes (Signed)
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  Lexiscan Myoview stress test June 2018 showing LVEF 75%, normal perfusion. She underwent echocardiogram in January 2021 and diagnosed with Libman-Sacks endocarditis in 2021 in the setting of mitral valve anterior mitral leaflet tip vegetation in the setting of lupus.  She was treated with anticoagulation. Xarelto was discontinued in 2022 through shared decision making due to echocardiogram revealing well-healed vegetation at the tip of the mitral valve.  Carotid artery duplex on January 2021 consistent with right and left ICA 1-39% stenosis.  HRCT chest July 2021 showing lungs compatible with ILD, aortic atherosclerosis, left main and three-vessel coronary artery disease.  Echocardiogram April 2024 with LVEF 60 to 65%, no RWMA, grade 1 DD, RV SF normal, severe calcification of anterior mitral valve leaflet, trivial MR.  She was recently admitted to the hospital on 10/11/2022.  She was ultimately diagnosed with sepsis due to severe multifocal pneumonia.  She was experiencing shortness of breath and EKG confirmed new onset atrial flutter with rapid ventricular rate.  She was started on IV diltiazem and later transition to 60 mg diltiazem twice daily for rate control. She was also discharged on prior-to-admission amlodipine. Her IV heparin was transitioned to Eliquis.  Echocardiogram 10/12/2022 with LVEF 60 to 65%, no RWMA, mild LVH, grade 2 DD, RV SF normal, left and right atrial size  is mildly dilated, mild MR, trivial AR, aortic valve sclerosis. Of note, there was also mention of brief afib/flutter during 04/2022 admission as well in the setting of syncope and volume depletion.       History of Present Illness:  Discussed the use of AI scribe software for clinical note transcription with the patient, who gave verbal consent to proceed.  [name] is a 82 y.o. female who returns for hospital follow-up for atrial flutter.  History of Present Illness   Today patient presents for a follow-up visit. She reports feeling well since her hospital discharge, with no recurrence of fevers, tachycardia, palpitations.  She been asymptomatic denies any recurrence of atrial flutter since discharge.  She lives on the second floor and manages the stairs without significant shortness of breath, unless carrying heavy items. She denies chest pain, leg swelling, dizziness, lightheadedness, and urinary or gastrointestinal issues. During her hospitalization, she developed atrial flutter, for which she was started on Eliquis and diltiazem. However, she has been off these medications for approximately a month due to a lack of communication. She expresses concern about not being on OAC.     Review of Systems  Constitutional: Negative for weight gain and weight loss.  Cardiovascular:  Negative for chest pain, claudication, cyanosis, dyspnea on exertion, irregular heartbeat, leg swelling, near-syncope, orthopnea, palpitations, paroxysmal nocturnal dyspnea and syncope.  Respiratory:  Positive for shortness of breath. Negative for cough and hemoptysis.   Gastrointestinal:  Negative for abdominal pain, hematochezia and melena.  Genitourinary:  Negative for hematuria.     See HPI    Studies Reviewed:        EKG  01/07/2023 personally reviewed: Normal sinus rhythm with heart rate 75 bpm with nonspecific TW changes in I, avL, V5-V6. Similar to 05/2022. Will be scanned to chart due to MUSE transfer  issue.  Echocardiogram 10/12/2022 1. Left ventricular ejection fraction, by estimation, is 60 to 65%. The  left ventricle has normal function. The left ventricle has no regional  wall motion abnormalities. There is mild left ventricular hypertrophy.  Left ventricular diastolic parameters  are consistent with Grade II diastolic dysfunction (pseudonormalization).  Elevated left atrial pressure.   2. Right ventricular systolic function is normal. The right ventricular  size is normal. There is mildly elevated pulmonary artery systolic  pressure.   3. Left atrial size was mildly dilated.   4. Right atrial size was mildly dilated.   5. The mitral valve is normal in structure. Mild mitral valve  regurgitation. No evidence of mitral stenosis.   6. The aortic valve is tricuspid. Aortic valve regurgitation is trivial.  Aortic valve sclerosis is present, with no evidence of aortic valve  stenosis.   7. The inferior vena cava is normal in size with greater than 50%  respiratory variability, suggesting right atrial pressure of 3 mmHg.   Zio patch cardiac monitor 11 days (07/14/2021 - 07/25/2021): Predominant underlying rhythm was sinus.  8 episodes of SVT with the longest lasting 19 beats.  Single nonsustained asymptomatic 4 beat episode of ventricular tachycardia at 7 AM on 07/20/2021.  Rare PACs and rare PVCs.  Minimum heart rate 48 bpm, maximum heart rate 220 bpm, average heart rate 73 bpm.  No evidence of high degree AV block, pauses >3 seconds, or atrial fibrillation.  No patient triggered events.  Carotid duplex 02/18/2019 Right Carotid: Velocities in the right ICA are consistent with a 1-39%  stenosis.   Left Carotid: Velocities in the left ICA are consistent with a 1-39%  stenosis.  Risk Assessment/Calculations:    CHA2DS2-VASc Score = 6   This indicates a 9.7% annual risk of stroke. The patient's score is based upon: CHF History: 0 HTN History: 1 Diabetes History: 1 Stroke History:  0 Vascular Disease History: 1 Age Score: 2 Gender Score: 1            Physical Exam:   VS:  BP 110/70   Pulse 75   Ht 5\' 7"  (1.702 m)   Wt 149 lb (67.6 kg)   SpO2 96%   BMI 23.34 kg/m    Wt Readings from Last 3 Encounters:  01/07/23 149 lb (67.6 kg)  10/29/22 148 lb 6.4 oz (67.3 kg)  10/16/22 145 lb 8 oz (66 kg)    Constitutional:      Appearance: Normal and healthy appearance.  HENT:     Head: Normocephalic.  Neck:     Vascular: JVD normal.  Pulmonary:     Effort: Pulmonary effort is normal.     Breath sounds: Normal breath sounds.  Chest:     Chest wall: Not tender to palpatation.  Cardiovascular:     PMI at left midclavicular line. Normal rate. Regular rhythm. Normal S1. Normal S2.      Murmurs: There is no murmur.     No gallop.  No click. No rub.  Pulses:    Intact distal pulses.  Edema:    Peripheral edema absent.  Musculoskeletal: Normal range of motion.     Cervical back: Normal range of motion and neck supple. Skin:    General: Skin is warm and dry.  Neurological:  General: No focal deficit present.     Mental Status: Alert, oriented to person, place, and time and oriented to person, place and time.  Psychiatric:        Attention and Perception: Attention and perception normal.        Mood and Affect: Mood normal.        Behavior: Behavior is cooperative.        Thought Content: Thought content normal.        Assessment and Plan:  Paroxymal atrial flutter / chronic anticoagulation -CHA2DS2-VASc Score = 6 [CHF History: 0, HTN History: 1, Diabetes History: 1, Stroke History: 0, Vascular Disease History: 1, Age Score: 2, Gender Score: 1].  Therefore, the patient's annual risk of stroke is 9.7 %.     -New onset atrial flutter with RVR in the setting of severe sepsis 10/11/2022, but also noted during unrelated admission 04/2022 therefore suspect paroxysmal -EKG today NSR with no ST-T wave changes -Asymptomatic today.  No complaints of syncope,  palpitations, tachycardia -Has been out of Eliquis and diltiazem for around a month. Will refill Eliquis 5 mg twice daily, does not meet dose reduction qualifications but continue to follow weight -Reports that she is out of diltiazem. Will consolidate previously intended diltiazem dose to extended release 120mg  daily and discontinue amlodipine to accommodate this change.  -Repeat CBC and CMP  Hypertension -BP today 110/70. Well controlled. Confirmed patient is on spironolactone, had fallen off med list at some point. Pharmacy confirmed this. Added back to list today. -Follow with plan above -Encouraged to log blood pressures daily at home  Mild carotid diease / Coronary artery disease / HLD goal <70 -LDL 99, TG 155 on 04/01/2022 -HRCT chest 07/2019 showed aortic atherosclerosis, in addition to left main and 3 vessel coronary artery disease  -Carotid duplex 09/2019 showed right / left ICA consistent with 1-39% stenosis. No indication to repeat acutely -Not on ASA due to [facility].  History of statin intolerance.  Will follow-up regarding statin therapy on next visit.  Can consider ezetimibe or referral to lipid clinic -Encouraged heart healthy dieting by increasing fiber, which was covered diet.  Focus on decreasing processed foods and foods high in sugar.  T2DM  -A1c 6.7 on 04/25/2022 -Continue to follow with PCP  History of Libman-Sacks endocarditis Previous diagnosis; felt to be resolved Given issues with bacteremia in the hospital, encouraged to f/u PCP, no concerns on recent echo Discussed question of SBE ppx with Dr. Jacinto Halim who does not feel this is warranted given culture negative at the time of diagnosis        Dispo:  Return in about 1 month (around 02/07/2023).  Signed, Denyce Robert, NP

## 2023-01-07 NOTE — Telephone Encounter (Signed)
10/11/22 afib started on eliquis 5mg  bid also on 10/22/22 note it states per Dr. Jacinto Halim: She also had atrial fibrillation. To prevent stroke she is on Eliquis. Yes should be on

## 2023-01-07 NOTE — Patient Instructions (Addendum)
Medication Instructions:  STOP AMLODIPINE  START SPIRONOLACTONE 12.5 MG DAILY  START CARDIZEM CD 120 MG DAILY  *If you need a refill on your cardiac medications before your next appointment, please call your pharmacy*   Lab Work: BMET AND CBC If you have labs (blood work) drawn today and your tests are completely normal, you will receive your results only by: MyChart Message (if you have MyChart) OR A paper copy in the mail If you have any lab test that is abnormal or we need to change your treatment, we will call you to review the results.   Testing/Procedures:    Follow-Up: At [facility], you and your health needs are our priority.  As part of our continuing mission to provide you with exceptional heart care, we have created designated Provider Care Teams.  These Care Teams include your primary Cardiologist (physician) and Advanced Practice Providers (APPs -  Physician Assistants and Nurse Practitioners) who all work together to provide you with the care you need, when you need it.  We recommend signing up for the patient portal called "MyChart".  Sign up information is provided on this After Visit Summary.  MyChart is used to connect with patients for Virtual Visits (Telemedicine).  Patients are able to view lab/test results, encounter notes, upcoming appointments, etc.  Non-urgent messages can be sent to your provider as well.   To learn more about what you can do with MyChart, go to [email or web address].    Your next appointment: 1 MONTHS WITH DAYNA

## 2023-01-08 ENCOUNTER — Other Ambulatory Visit: Payer: Self-pay | Admitting: Physician Assistant

## 2023-01-08 ENCOUNTER — Ambulatory Visit
Admission: RE | Admit: 2023-01-08 | Discharge: 2023-01-08 | Disposition: A | Discharge status: Home / Self Care | Payer: 59 | Source: Ambulatory Visit | Attending: Physician Assistant | Admitting: Physician Assistant

## 2023-01-08 DIAGNOSIS — R921 Mammographic calcification found on diagnostic imaging of breast: Secondary | ICD-10-CM | POA: Diagnosis not present

## 2023-01-08 DIAGNOSIS — N641 Fat necrosis of breast: Secondary | ICD-10-CM | POA: Diagnosis not present

## 2023-01-08 DIAGNOSIS — N6322 Unspecified lump in the left breast, upper inner quadrant: Secondary | ICD-10-CM | POA: Diagnosis not present

## 2023-01-08 DIAGNOSIS — N6002 Solitary cyst of left breast: Secondary | ICD-10-CM

## 2023-01-08 DIAGNOSIS — N6320 Unspecified lump in the left breast, unspecified quadrant: Secondary | ICD-10-CM

## 2023-01-08 LAB — BASIC METABOLIC PANEL
BUN/Creatinine Ratio: 10 — ABNORMAL LOW (ref 12–28)
BUN: 12 mg/dL (ref 8–27)
CO2: 23 mmol/L (ref 20–29)
Calcium: 9.6 mg/dL (ref 8.7–10.3)
Chloride: 102 mmol/L (ref 96–106)
Creatinine, Ser: 1.15 mg/dL — ABNORMAL HIGH (ref 0.57–1.00)
Glucose: 102 mg/dL — ABNORMAL HIGH (ref 70–99)
Potassium: 4.3 mmol/L (ref 3.5–5.2)
Sodium: 138 mmol/L (ref 134–144)
eGFR: 48 mL/min/{1.73_m2} — ABNORMAL LOW (ref >59)

## 2023-01-08 LAB — CBC
Hematocrit: 36.4 % (ref 34.0–46.6)
Hemoglobin: 11.7 g/dL (ref 11.1–15.9)
MCH: 28.7 pg (ref 26.6–33.0)
MCHC: 32.1 g/dL (ref 31.5–35.7)
MCV: 89 fL (ref 79–97)
Platelets: 257 10*3/uL (ref 150–450)
RBC: 4.07 x10E6/uL (ref 3.77–5.28)
RDW: 13.9 % (ref 11.7–15.4)
WBC: 6 10*3/uL (ref 3.4–10.8)

## 2023-01-15 ENCOUNTER — Ambulatory Visit
Admission: RE | Admit: 2023-01-15 | Discharge: 2023-01-15 | Disposition: A | Discharge status: Home / Self Care | Payer: 59 | Source: Ambulatory Visit | Attending: Physician Assistant | Admitting: Physician Assistant

## 2023-01-15 ENCOUNTER — Other Ambulatory Visit: Payer: Self-pay | Admitting: Physician Assistant

## 2023-01-15 DIAGNOSIS — N6322 Unspecified lump in the left breast, upper inner quadrant: Secondary | ICD-10-CM | POA: Diagnosis not present

## 2023-01-15 DIAGNOSIS — R921 Mammographic calcification found on diagnostic imaging of breast: Secondary | ICD-10-CM

## 2023-01-15 DIAGNOSIS — N6320 Unspecified lump in the left breast, unspecified quadrant: Secondary | ICD-10-CM

## 2023-01-15 DIAGNOSIS — C50212 Malignant neoplasm of upper-inner quadrant of left female breast: Secondary | ICD-10-CM | POA: Diagnosis not present

## 2023-01-15 DIAGNOSIS — N6002 Solitary cyst of left breast: Secondary | ICD-10-CM

## 2023-01-15 DIAGNOSIS — Z170 Estrogen receptor positive status [ER+]: Secondary | ICD-10-CM | POA: Diagnosis not present

## 2023-01-15 HISTORY — PX: BREAST BIOPSY: SHX20

## 2023-01-21 LAB — SURGICAL PATHOLOGY

## 2023-02-11 ENCOUNTER — Ambulatory Visit: Payer: 59 | Admitting: Physician Assistant

## 2023-02-12 ENCOUNTER — Other Ambulatory Visit: Payer: Self-pay | Admitting: Surgery

## 2023-02-12 DIAGNOSIS — C50912 Malignant neoplasm of unspecified site of left female breast: Secondary | ICD-10-CM | POA: Diagnosis not present

## 2023-02-15 ENCOUNTER — Telehealth: Payer: Self-pay | Admitting: Genetic Counselor

## 2023-02-15 NOTE — Telephone Encounter (Signed)
Scheduled appointments per scheduling message. Patient is aware of the made appointments and will be mailed an appointment reminder.

## 2023-02-16 ENCOUNTER — Encounter: Payer: Self-pay | Admitting: *Deleted

## 2023-02-17 ENCOUNTER — Ambulatory Visit: Payer: Medicaid Other | Admitting: Cardiology

## 2023-02-17 NOTE — Progress Notes (Deleted)
Cardiology Office Note:  .   Date:  02/17/2023  ID:  RASHAWN [name], DOB November 22, 1940, MRN [id number] PCP: Merri Brunette, MD  [facility] Providers Cardiologist:  Yates Decamp, MD { Click to update primary MD,subspecialty MD or APP then REFRESH:1}  History of Present Illness: .   [name] is a 83 y.o. 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  This is her third major episode of sepsis needing hospitalization including UTI in January 2021, UTI with Enterococcus again in March 2024.  Discussed the use of AI scribe software for clinical note transcription with the patient, who gave verbal consent to proceed.  History of Present Illness            Labs   Lab Results  Component Value Date   CHOL 152 02/20/2019   HDL 37 (L) 02/20/2019   LDLCALC 88 02/20/2019   TRIG 134 02/20/2019   CHOLHDL 4.1 02/20/2019   Lab Results  Component Value Date   NA 138 01/07/2023   K 4.3 01/07/2023   CO2 23 01/07/2023   GLUCOSE 102 (H) 01/07/2023   BUN 12 01/07/2023   CREATININE 1.15 (H) 01/07/2023   CALCIUM 9.6 01/07/2023   GFR 52.58 (L) 10/29/2022   EGFR 48 (L) 01/07/2023   GFRNONAA 59 (L) 10/14/2022      Latest Ref Rng & Units 01/07/2023    2:39 PM 10/29/2022    2:05 PM 10/14/2022    2:27 AM  BMP  Glucose 70 - 99 mg/dL 188  416  606   BUN 8 - 27 mg/dL 12  11  12    Creatinine 0.57 - 1.00 mg/dL 3.01  6.01  0.93   BUN/Creat Ratio 12 - 28 10     Sodium 134 - 144 mmol/L 138  130  134   Potassium 3.5 - 5.2 mmol/L 4.3  3.9  3.5   Chloride 96 - 106 mmol/L 102  98  104   CO2 20 - 29 mmol/L 23  28  19    Calcium 8.7 - 10.3 mg/dL 9.6  9.6  8.8       Latest Ref Rng & Units 01/07/2023     2:39 PM 10/29/2022    2:05 PM 10/14/2022    2:27 AM  CBC  WBC 3.4 - 10.8 x10E3/uL 6.0  5.2  10.0   Hemoglobin 11.1 - 15.9 g/dL 23.5  57.3  9.6   Hematocrit 34.0 - 46.6 % 36.4  32.9  28.6   Platelets 150 - 450 x10E3/uL 257  292.0  197    External Labs:  ***  ***ROS  Physical Exam:   VS:  There were no vitals taken for this visit.   Wt Readings from Last 3 Encounters:  01/07/23 149 lb (67.6 kg)  10/29/22 148 lb 6.4 oz (67.3 kg)  10/16/22 145 lb 8 oz (66 kg)     ***Physical Exam  Studies Reviewed: Marland Kitchen    ECHOCARDIOGRAM COMPLETE 10/12/2022  1. Left ventricular ejection fraction, by estimation, is 60 to 65%. The left ventricle has normal function. The left ventricle has no regional wall motion abnormalities. There is mild left ventricular hypertrophy.  Left ventricular diastolic parameters are consistent with Grade II diastolic dysfunction (pseudonormalization). Elevated left atrial pressure. 2.  Right ventricular systolic function is normal. The right ventricular size is normal. There is mildly elevated pulmonary artery systolic pressure. 3. Left atrial size was mildly dilated. 4. Right atrial size was mildly dilated. 5. The mitral valve is normal in structure. Mild mitral valve regurgitation. No evidence of mitral stenosis. 6. The aortic valve is tricuspid. Aortic valve regurgitation is trivial. Aortic valve sclerosis is present, with no evidence of aortic valve stenosis. 7. The inferior vena cava is normal in size with greater than 50% respiratory variability, suggesting right atrial pressure of 3 mmHg.  EKG:           Medications and allergies    Allergies  Allergen Reactions   Ciprofloxacin Itching, Swelling and Other (See Comments)    Possibly causing tremors?   Lisinopril Cough   Crestor [Rosuvastatin] Other (See Comments)    Myalgia and back pain   Ofev [Nintedanib] Nausea Only     Current Outpatient Medications:    Accu-Chek Softclix Lancets lancets,  SMARTSIG:Topical, Disp: , Rfl:    Blood Glucose Monitoring Suppl (ACCU-CHEK GUIDE) w/Device KIT, USE TO CHECK BLOOD SUGAR AS DIRECTED BY PHYSICIAN., Disp: , Rfl:    dextromethorphan-guaiFENesin (MUCINEX DM) 30-600 MG 12hr tablet, Take 1 tablet by mouth 2 (two) times daily., Disp: 30 tablet, Rfl: 0   diclofenac Sodium (VOLTAREN) 1 % GEL, Apply 1 application. topically 4 (four) times daily as needed (pain)., Disp: , Rfl:    diltiazem (CARDIZEM CD) 120 MG 24 hr capsule, Take 1 capsule (120 mg total) by mouth daily., Disp: 90 capsule, Rfl: 3   ELIQUIS 5 MG TABS tablet, TAKE 1 TABLET BY MOUTH 2 TIMES DAILY, Disp: 60 tablet, Rfl: 3   gabapentin (NEURONTIN) 300 MG capsule, Take 300 mg by mouth 3 (three) times daily., Disp: , Rfl:    glimepiride (AMARYL) 1 MG tablet, Take 1 mg by mouth daily with breakfast., Disp: , Rfl:    glucose blood test strip, USE TO CHECK BLOOD SUGAR ONCE DAILY ALTERNATING BETWEEN BEFORE BREAKFAST AND BEFORE DINNER, Disp: , Rfl:    hydroxychloroquine (PLAQUENIL) 200 MG tablet, Take 200 mg by mouth daily., Disp: , Rfl:    levothyroxine (SYNTHROID) 100 MCG tablet, Take 100 mcg by mouth daily., Disp: , Rfl:    omeprazole (PRILOSEC) 20 MG capsule, Take 20 mg by mouth every morning., Disp: , Rfl:    spironolactone (ALDACTONE) 25 MG tablet, Take 0.5 tablets (12.5 mg total) by mouth daily., Disp: 45 tablet, Rfl: 3   ASSESSMENT AND PLAN: .      ICD-10-CM   1. Paroxysmal atrial flutter (HCC)  I48.92     2. Primary hypertension  I10     3. ILD (interstitial lung disease) (HCC)  J84.9     4. Stage 3a chronic kidney disease (HCC)  N18.31       1. Paroxysmal atrial flutter (HCC) ***  2. Primary hypertension ***  3. ILD (interstitial lung disease) (HCC) ***  4. Stage 3a chronic kidney disease (HCC) ***  Assessment and Plan                 Signed,  Yates Decamp, MD, [facility] 02/17/2023, 6:12 AM [facility] [address] [place], [place] [address] Phone:  [phone]. Fax:  [phone]

## 2023-02-18 ENCOUNTER — Encounter: Payer: Self-pay | Admitting: Cardiology

## 2023-02-18 ENCOUNTER — Inpatient Hospital Stay: Payer: 59 | Attending: Hematology and Oncology | Admitting: Hematology and Oncology

## 2023-02-18 ENCOUNTER — Ambulatory Visit
Admission: RE | Admit: 2023-02-18 | Discharge: 2023-02-18 | Disposition: A | Discharge status: Home / Self Care | Payer: 59 | Source: Ambulatory Visit | Attending: Surgery | Admitting: Surgery

## 2023-02-18 VITALS — BP 118/76 | HR 88 | Temp 98.0°F | Resp 17 | Ht 67.0 in | Wt 146.6 lb

## 2023-02-18 DIAGNOSIS — C50912 Malignant neoplasm of unspecified site of left female breast: Secondary | ICD-10-CM

## 2023-02-18 DIAGNOSIS — R928 Other abnormal and inconclusive findings on diagnostic imaging of breast: Secondary | ICD-10-CM | POA: Diagnosis not present

## 2023-02-18 DIAGNOSIS — C50512 Malignant neoplasm of lower-outer quadrant of left female breast: Secondary | ICD-10-CM | POA: Diagnosis not present

## 2023-02-18 DIAGNOSIS — Z923 Personal history of irradiation: Secondary | ICD-10-CM | POA: Diagnosis not present

## 2023-02-18 DIAGNOSIS — Z170 Estrogen receptor positive status [ER+]: Secondary | ICD-10-CM | POA: Diagnosis not present

## 2023-02-18 DIAGNOSIS — C50519 Malignant neoplasm of lower-outer quadrant of unspecified female breast: Secondary | ICD-10-CM | POA: Insufficient documentation

## 2023-02-18 DIAGNOSIS — C50312 Malignant neoplasm of lower-inner quadrant of left female breast: Secondary | ICD-10-CM | POA: Diagnosis not present

## 2023-02-18 MED ORDER — GADOPICLENOL 0.5 MMOL/ML IV SOLN
6.0000 mL | Freq: Once | INTRAVENOUS | Status: AC | PRN
  Administered 2023-02-18: 6 mL via INTRAVENOUS

## 2023-02-18 NOTE — Assessment & Plan Note (Signed)
Breast Cancer Recurrence New left breast mass identified incidentally on CT scan. History of breast cancer in 2009 treated with lumpectomy and radiation. Current mass is small (1.1 cm) but appears aggressive on histology with high grade and 90% Ki 67. ER 70% weak to moderate staining. -Recommend mastectomy due to aggressive nature of tumor and previous radiation treatment ( She may not be a candidate for re irradiation) -Consider Anastrozole post-surgery, if tolerated. - Given her multiple co-morbidities, advanced age and ? Understanding of the ongoing issues ( she says she understood but when I tried to use the teach back technique, she didn't tell me much, she said whatever needs to be done needs to be done), she is not a good candidate for chemotherapy adjuvant. -Genetic testing recommended.  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  General Health Maintenance / Followup Plans -Consider genetic testing for patient and family due to history of breast cancer. -Follow-up after surgery to assess recovery and discuss further treatment options.

## 2023-02-18 NOTE — Progress Notes (Signed)
[facility] CONSULT NOTE  Patient Care Team: Merri Brunette, MD as PCP - General (Family Medicine) Yates Decamp, MD as PCP - Cardiology (Cardiology) Zenovia Jordan, MD as Consulting Physician (Rheumatology)  CHIEF COMPLAINTS/PURPOSE OF CONSULTATION:  Newly diagnosed breast cancer  HISTORY OF PRESENTING ILLNESS:  [name] 83 y.o. female is here because of recent diagnosis of left breast IDC  I reviewed her records extensively and collaborated the history with the patient.  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 1.1 cm probable cystic appearing mass lies slightly above and lateral to the lumpectomy bed calcification. Aspiration to confirm this as a benign cyst is recommended.   01/08/2023 Breast US   Ultrasound-guided cyst aspiration of the 1.1 cm presumed complicated cyst in the left breast at 10 o'clock, 2 cm the nipple. If this does not aspirate, ultrasound-guided core needle biopsy would be indicated.     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  History of Present Illness         The patient is an elderly female with a history of breast cancer treated with lumpectomy and radiation therapy in 2009. She has been doing well until recently when a breast mass was incidentally found on a CT scan. The patient did not feel the lump in her breast and was not aware of its presence. The patient has other health issues including lupus, thyroid problems, and interstitial lung disease. The patient's daughter, who is also present during the consultation, has a history of breast cancer as well. The patient and her daughter are considering treatment options, including surgery and holistic methods. The patient's daughter has been giving her soursop, a fruit believed to have cancer-fighting properties. The patient is hesitant about undergoing another surgery and is considering holistic methods for treatment.   MEDICAL HISTORY:  Past Medical History:  Diagnosis Date   Acute bronchitis 12/17/2021   Acute cystitis 02/20/2019   Breast cancer (HCC)    Diabetes mellitus without complication (HCC)    Hypertension    Hypothyroidism    Lupus    Pneumonia 07/31/2021    SURGICAL HISTORY: Past Surgical History:  Procedure Laterality Date   BREAST BIOPSY Left 01/15/2023   [place] LT BREAST BX W LOC DEV 1ST LESION IMG BX SPEC US GUIDE 01/15/2023 [facility] MAMMOGRAPHY   BREAST LUMPECTOMY Left 2009   IR 3D INDEPENDENT [facility]KST  05/29/2021   IR ANGIO INTRA EXTRACRAN SEL COM CAROTID INNOMINATE BILAT MOD SED  05/29/2021   IR ANGIO VERTEBRAL SEL VERTEBRAL BILAT MOD SED  05/29/2021    SOCIAL HISTORY: Social History  Socioeconomic History   Marital status: Single    Spouse name: Not on file   Number of children: 2   Years of education: Not on file   Highest education level: Not on file  Occupational History   Not on file  Tobacco Use   Smoking status: Never   Smokeless tobacco: Former    Types: Snuff    Quit date: 02/03/2015  Vaping Use   Vaping  status: Never Used  Substance and Sexual Activity   Alcohol use: No   Drug use: No   Sexual activity: Not Currently  Other Topics Concern   Not on file  Social History Narrative   Not on file   Social Drivers of Health   Financial Resource Strain: Not on file  Food Insecurity: No Food Insecurity (10/12/2022)   Hunger Vital Sign    Worried About Running Out of Food in the Last Year: Never true    Ran Out of Food in the Last Year: Never true  Transportation Needs: No Transportation Needs (10/12/2022)   PRAPARE - Administrator, Civil Service (Medical): No    Lack of Transportation (Non-Medical): No  Physical Activity: Not on file  Stress: Not on file  Social Connections: Not on file  Intimate Partner Violence: Not At Risk (10/12/2022)   Humiliation, Afraid, Rape, and Kick questionnaire    Fear of Current or Ex-Partner: No    Emotionally Abused: No    Physically Abused: No    Sexually Abused: No    FAMILY HISTORY: Family History  Problem Relation Age of Onset   Cancer Father        ? type   Breast cancer Daughter    Asthma Brother     ALLERGIES:  is allergic to ciprofloxacin, lisinopril, crestor [rosuvastatin], and ofev [nintedanib].  MEDICATIONS:  Current Outpatient Medications  Medication Sig Dispense Refill   Accu-Chek Softclix Lancets lancets SMARTSIG:Topical     Blood Glucose Monitoring Suppl (ACCU-CHEK GUIDE) w/Device KIT USE TO CHECK BLOOD SUGAR AS DIRECTED BY PHYSICIAN.     dextromethorphan-guaiFENesin (MUCINEX DM) 30-600 MG 12hr tablet Take 1 tablet by mouth 2 (two) times daily. 30 tablet 0   diclofenac Sodium (VOLTAREN) 1 % GEL Apply 1 application. topically 4 (four) times daily as needed (pain).     diltiazem (CARDIZEM CD) 120 MG 24 hr capsule Take 1 capsule (120 mg total) by mouth daily. 90 capsule 3   ELIQUIS 5 MG TABS tablet TAKE 1 TABLET BY MOUTH 2 TIMES DAILY 60 tablet 3   gabapentin (NEURONTIN) 300 MG capsule Take 300 mg by mouth 3 (three)  times daily.     glimepiride (AMARYL) 1 MG tablet Take 1 mg by mouth daily with breakfast.     glucose blood test strip USE TO CHECK BLOOD SUGAR ONCE DAILY ALTERNATING BETWEEN BEFORE BREAKFAST AND BEFORE DINNER     hydroxychloroquine (PLAQUENIL) 200 MG tablet Take 200 mg by mouth daily.     levothyroxine (SYNTHROID) 100 MCG tablet Take 100 mcg by mouth daily.     omeprazole (PRILOSEC) 20 MG capsule Take 20 mg by mouth every morning.     spironolactone (ALDACTONE) 25 MG tablet Take 0.5 tablets (12.5 mg total) by mouth daily. 45 tablet 3   No current facility-administered medications for this visit.    REVIEW OF SYSTEMS:   Constitutional: Denies fevers, chills or abnormal night sweats Eyes: Denies blurriness of vision, double vision or watery eyes Ears, nose, mouth, throat, and face: Denies  mucositis or sore throat Respiratory: Denies cough, dyspnea or wheezes Cardiovascular: Denies palpitation, chest discomfort or lower extremity swelling Gastrointestinal:  Denies nausea, heartburn or change in bowel habits Skin: Denies abnormal skin rashes Lymphatics: Denies new lymphadenopathy or easy bruising Neurological:Denies numbness, tingling or new weaknesses Behavioral/Psych: Mood is stable, no new changes  Breast: Denies any palpable lumps or discharge All other systems were reviewed with the patient and are negative.  PHYSICAL EXAMINATION: ECOG PERFORMANCE STATUS: 1 - Symptomatic but completely ambulatory  Vitals:   02/18/23 1012  BP: 118/76  Pulse: 88  Resp: 17  Temp: 98 F (36.7 C)  SpO2: 99%   Filed Weights   02/18/23 1012  Weight: 146 lb 9.6 oz (66.5 kg)    GENERAL:alert, no distress and comfortable SKIN: skin color, texture, turgor are normal, no rashes or significant lesions EYES: normal, conjunctiva are pink and non-injected, sclera clear OROPHARYNX:no exudate, no erythema and lips, buccal mucosa, and tongue normal  NECK: supple, thyroid normal size, non-tender, without  nodularity LYMPH:  no palpable lymphadenopathy in the cervical, axillary or inguinal LUNGS: clear to auscultation and percussion with normal breathing effort HEART: regular rate & rhythm and no murmurs and no lower extremity edema ABDOMEN:abdomen soft, non-tender and normal bowel sounds Musculoskeletal:no cyanosis of digits and no clubbing  PSYCH: alert & oriented x 3 with fluent speech NEURO: no focal motor/sensory deficits BREAST:Left breast s/p surgical change. The new mass is not as obvious especially since this is my first time seeing her and I don't have a baseline for her breast exam. No regional adenopathy.  LABORATORY DATA:  I have reviewed the data as listed Lab Results  Component Value Date   WBC 6.0 01/07/2023   HGB 11.7 01/07/2023   HCT 36.4 01/07/2023   MCV 89 01/07/2023   PLT 257 01/07/2023   Lab Results  Component Value Date   NA 138 01/07/2023   K 4.3 01/07/2023   CL 102 01/07/2023   CO2 23 01/07/2023    RADIOGRAPHIC STUDIES: I have personally reviewed the radiological reports and agreed with the findings in the report.  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 History of breast cancer in 2009 treated with lumpectomy and radiation. Current mass is small (1.1 cm) but appears aggressive on histology with high grade and 90% Ki 67. ER 70% weak to moderate staining. -Recommend mastectomy due to aggressive nature of tumor and previous radiation treatment ( She may not be a candidate for re irradiation) -Consider Anastrozole post-surgery, if tolerated. - Given her multiple co-morbidities, advanced age and ? Understanding of the ongoing issues ( she says she understood but when I tried to use the teach back technique, she didn't tell me much, she said whatever needs to be done needs to be done), she is not a good candidate for chemotherapy  adjuvant. -Genetic testing recommended.  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  General Health Maintenance / Followup Plans -Consider genetic testing for patient and family due to history of breast cancer. -Follow-up after surgery to assess recovery and discuss further treatment options.  I have asked her to think about the treatment options we discussed today I talked to our NN to engage with her tomorrow to see if she would like to proceed with surgery. All questions were answered. The patient knows to call the clinic with any problems, questions or  concerns.    Rachel Moulds, MD 02/18/23

## 2023-02-19 ENCOUNTER — Telehealth: Payer: Self-pay | Admitting: *Deleted

## 2023-02-19 ENCOUNTER — Other Ambulatory Visit: Payer: Self-pay | Admitting: Surgery

## 2023-02-19 ENCOUNTER — Encounter: Payer: Self-pay | Admitting: *Deleted

## 2023-02-19 ENCOUNTER — Telehealth: Payer: Self-pay

## 2023-02-19 ENCOUNTER — Telehealth: Payer: Self-pay | Admitting: Hematology and Oncology

## 2023-02-19 NOTE — Telephone Encounter (Signed)
Spoke with patient confirming upcoming appointment

## 2023-02-19 NOTE — Telephone Encounter (Signed)
   Pre-operative Risk Assessment    Patient Name: [name]  DOB: November 15, 1940 MRN: [id number]   Date of last office visit: 01/07/23 Date of next office visit: n/a    Request for Surgical Clearance    Procedure:   Mastectomy surgery   Date of Surgery:  Clearance TBD                                 Surgeon:  [name], MD  Surgeon's Group or Practice Name:  [facility]  Phone number:  [phone] Fax number:  [phone]   Type of Clearance Requested:   - Medical  - Pharmacy:  Hold Apixaban (Eliquis) Not indicated    Type of Anesthesia:   LMA and pectoral block    Additional requests/questions:    [name]   02/19/2023, 2:34 PM

## 2023-02-19 NOTE — Telephone Encounter (Signed)
Preop clearance appt now scheduled

## 2023-02-19 NOTE — Telephone Encounter (Signed)
   Name: [name]  DOB: 1940/09/01  MRN: [id number]  Primary Cardiologist: Yates Decamp, MD  Chart reviewed as part of pre-operative protocol coverage. Because of [name]'s past medical history and time since last visit, she will require a follow-up in-office visit in order to better assess preoperative cardiovascular risk.1 month follow-up was recommended at last OV.   Pre-op covering staff: - Please schedule appointment and call patient to inform them. If patient already had an upcoming appointment within acceptable timeframe, please add "pre-op clearance" to the appointment notes so provider is aware. - Please contact requesting surgeon's office via preferred method (i.e, phone, fax) to inform them of need for appointment prior to surgery.  This message will also be routed to pharmacy pool for input on holding Eliquis as requested below so that this information is available to the clearing provider at time of patient's appointment.   Joylene Grapes, NP  02/19/2023, 2:38 PM

## 2023-02-19 NOTE — Telephone Encounter (Signed)
Pt's daughter, Casimer Bilis, stating she has questions " because my mom met with the surgeon and stated if she did surgery she may not need genetics... "   "She is scheduled for genetics next week - does she need to keep that appt"  This RN informed her above would be forwarded to pt's team for appropriate follow up on question.  Return call number given for [place] [phone]

## 2023-02-22 ENCOUNTER — Inpatient Hospital Stay: Payer: 59 | Admitting: Licensed Clinical Social Worker

## 2023-02-22 NOTE — Progress Notes (Signed)
[facility] Clinical Social Work  Initial Assessment   [name] is a 83 y.o. year old female contacted by phone. Clinical Social Work was referred by new patient protocol for assessment of psychosocial needs.   SDOH (Social Determinants of Health) assessments performed: Yes SDOH Interventions    Flowsheet Row Clinical Support from 02/22/2023 in [facility] - A Dept Of [place] Office Visit from 06/03/2021 in [facility]  SDOH Interventions    Food Insecurity Interventions Intervention Not Indicated --  Housing Interventions Community Resources Provided, Other (Comment)  [cancer foundations] --  Transportation Interventions Intervention Not Indicated Taxi Voucher Given       SDOH Screenings   Food Insecurity: No Food Insecurity (02/22/2023)  Housing: High Risk (02/22/2023)  Transportation Needs: No Transportation Needs (02/22/2023)  Utilities: At Risk (10/12/2022)  Tobacco Use: Low Risk  (02/12/2023)   Received from [facility]  Recent Concern: Tobacco Use - Medium Risk (01/07/2023)     Distress Screen completed: No     No data to display            Family/Social Information:  Housing Arrangement: patient lives with her daughter, [name] members/support persons in your life? Family Transportation concerns: no  Employment: Retired .  Income source: Actor concerns: Yes, current concerns Type of concern: Rent/ mortgage Food access concerns: no Religious or spiritual practice: Not known Advanced directives: Not known Services Currently in place:  Medicare + Medicaid  Coping/ Adjustment to diagnosis: Patient understands treatment plan and what happens next? yes, waiting to have surgery Concerns about diagnosis and/or treatment: rent Patient reported stressors: Finances Current coping skills/ strengths: Supportive family/friends      SUMMARY: Current SDOH Barriers:  Housing barriers  Clinical Social Work Clinical Goal(s):  Explore community resource options for unmet needs related to:  Housing   Interventions: Discussed common feeling and emotions when being diagnosed with cancer, and the importance of support during treatment Informed patient of the support team roles and support services at [facility] Provided CSW contact information and encouraged patient to call with any questions or concerns Provided patient with information about [employer], Holiday representative, and other local resources for potential rent assistance Completed application to [name] for financial assistance   Follow Up Plan: Patient will contact CSW with any support or resource needs Patient verbalizes understanding of plan: Yes    [name] E Lemoyne Scarpati, LCSW Clinical Child psychotherapist [employer]

## 2023-02-25 NOTE — Telephone Encounter (Signed)
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    Procedure: Mastectomy surgery  Date of procedure: TBD   CHA2DS2-VASc Score = 6   This indicates a 9.7% annual risk of stroke. The patient's score is based upon: CHF History: 0 HTN History: 1 Diabetes History: 1 Stroke History: 0 Vascular Disease History: 1 Age Score: 2 Gender Score: 1      CrCl 40 ml/min Platelet count 257  Per office protocol, patient can hold Eliquis for 2 days prior to procedure.    **This guidance is not considered finalized until pre-operative APP has relayed final recommendations.**

## 2023-02-26 ENCOUNTER — Inpatient Hospital Stay: Payer: 59 | Admitting: Genetic Counselor

## 2023-02-26 ENCOUNTER — Encounter: Payer: Self-pay | Admitting: Genetic Counselor

## 2023-02-26 ENCOUNTER — Inpatient Hospital Stay: Payer: 59

## 2023-02-26 DIAGNOSIS — Z8042 Family history of malignant neoplasm of prostate: Secondary | ICD-10-CM

## 2023-02-26 DIAGNOSIS — Z170 Estrogen receptor positive status [ER+]: Secondary | ICD-10-CM

## 2023-02-26 DIAGNOSIS — C50512 Malignant neoplasm of lower-outer quadrant of left female breast: Secondary | ICD-10-CM

## 2023-02-26 DIAGNOSIS — Z803 Family history of malignant neoplasm of breast: Secondary | ICD-10-CM | POA: Diagnosis not present

## 2023-02-26 LAB — GENETIC SCREENING ORDER

## 2023-02-26 NOTE — Progress Notes (Signed)
REFERRING PROVIDER: Merri Brunette, MD [phone] [place],  [place] [address]  PRIMARY PROVIDER:  Merri Brunette, MD  PRIMARY REASON FOR VISIT:  1. Malignant neoplasm of lower-outer quadrant of left breast of female, estrogen receptor positive (HCC)   2. Family history of malignant neoplasm of breast   3. Family history of malignant neoplasm of prostate      HISTORY OF PRESENT ILLNESS:   Ms. Nordstrom, a 83 y.o. female, was seen for a [facility] cancer genetics consultation at the request of Dr. Katrinka Blazing due to a personal and family history of cancer.  Ms. Delangel presents to clinic today to discuss the possibility of a hereditary predisposition to cancer, genetic testing, and to further clarify her future cancer risks, as well as potential cancer risks for family members.   In 2025, at the age of 41, Ms. Vandruff was diagnosed with IDC (ER+/PR-/Her2-) of the left breast. Family considering options for treatment. Genetics requested urgently by Dr. Magnus Ivan to help facilitate treatment decisions. Ms. Deguzman was diagnosed with left breast cancer in 2009, around the age of 3, treated with lumpectomy and radiation.   Her daughter, Casimer Bilis, was present for this visit and helped to provide information on Ms. Tello's personal and family history.   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 1.1 cm probable cystic appearing mass lies slightly above and lateral to the lumpectomy bed calcification. Aspiration to confirm this as a benign cyst is recommended.   01/08/2023 Breast US   Ultrasound-guided cyst aspiration of the 1.1 cm presumed complicated cyst in the left breast at 10 o'clock, 2 cm the nipple. If this does not aspirate,  ultrasound-guided core needle biopsy would be indicated.     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  Estrogen Receptor:  70%, POSITIVE, WEAK-MODERATE STAINING INTENSITY  Progesterone Receptor:  0%, NEGATIVE  Proliferation Marker Ki67:  90%    02/18/2023 Initial Diagnosis   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)   02/18/2023 Cancer Staging   Staging form: Breast, AJCC 8th Edition - Clinical: Stage IB (cT1c, cN0, cM0, G3, ER+, PR-, HER2-) - Signed by Rachel Moulds, MD on 02/18/2023 Histologic grading system: 3 grade system     RISK FACTORS:  Menarche was at age 84.  First live birth at age 53.  Menopausal status: postmenopausal.  HRT use: 0 years. Colonoscopy: yes;  reports a couple small polyps on colonoscopy last year. Unsure of specific number or pathology.  . Mammogram within the last year: yes. Number of breast biopsies: 2. Any excessive radiation exposure in the past: no  Past Medical History:  Diagnosis Date   Acute bronchitis 12/17/2021   Acute cystitis 02/20/2019   Breast cancer (HCC)    Diabetes mellitus without complication (HCC)    Hypertension    Hypothyroidism    Lupus    Pneumonia 07/31/2021    Past Surgical History:  Procedure Laterality Date   BREAST BIOPSY Left 01/15/2023   [place] LT BREAST BX W LOC DEV 1ST LESION IMG BX SPEC US GUIDE 01/15/2023 [facility]   BREAST LUMPECTOMY Left 2009   IR 3D INDEPENDENT WKST  05/29/2021   IR  ANGIO INTRA EXTRACRAN SEL COM CAROTID INNOMINATE BILAT MOD SED  05/29/2021   IR ANGIO VERTEBRAL SEL VERTEBRAL BILAT MOD SED  05/29/2021    Social History   Socioeconomic History   Marital status: Single    Spouse name: Not on file   Number of children: 2   Years of education: Not on file   Highest education level: Not on file  Occupational History   Not on file  Tobacco Use   Smoking status: Never    Smokeless tobacco: Former    Types: Snuff    Quit date: 02/03/2015  Vaping Use   Vaping status: Never Used  Substance and Sexual Activity   Alcohol use: No   Drug use: No   Sexual activity: Not Currently  Other Topics Concern   Not on file  Social History Narrative   Not on file   Social Drivers of Health   Financial Resource Strain: Not on file  Food Insecurity: No Food Insecurity (02/22/2023)   Hunger Vital Sign    Worried About Running Out of Food in the Last Year: Never true    Ran Out of Food in the Last Year: Never true  Transportation Needs: No Transportation Needs (02/22/2023)   PRAPARE - Administrator, Civil Service (Medical): No    Lack of Transportation (Non-Medical): No  Physical Activity: Not on file  Stress: Not on file  Social Connections: Not on file     FAMILY HISTORY:  We obtained a detailed, 4-generation family history.  Significant diagnoses are listed below: Family History  Problem Relation Age of Onset   Prostate cancer Father 57 - 46       metaststic   Asthma Brother    Breast cancer Daughter 56   Ms. Mcclimans is unaware of previous family history of genetic testing for hereditary cancer risks. There is no reported Ashkenazi Jewish ancestry. There is no known consanguinity.  Ms. Freeze reports her daughter was diagnosed with breast cancer at age 73. She reports her father was diagnosed with prostate cancer age age 50, and died from his cancer. She reports a maternal first cousin once removed diagnosed with breast cancer in her 32s.     GENETIC COUNSELING ASSESSMENT: Ms. Gest is a 83 y.o. female with a personal and family history of cancer which is somewhat suggestive of a hereditary predisposition to cancer. We, therefore, discussed and recommended the following at today's visit.   DISCUSSION: We discussed that, in general, most cancer is not inherited in families, but instead is sporadic or familial. Sporadic cancers occur by chance and  typically happen at older ages (>50 years) as this type of cancer is caused by genetic changes acquired during an individual's lifetime. Some families have more cancers than would be expected by chance; however, the ages or types of cancer are not consistent with a known genetic mutation or known genetic mutations have been ruled out. This type of familial cancer is thought to be due to a combination of multiple genetic, environmental, hormonal, and lifestyle factors. While this combination of factors likely increases the risk of cancer, the exact source of this risk is not currently identifiable or testable.  We discussed that 5 - 10% of cancer is hereditary. We discussed that testing is beneficial for several reasons including knowing how to follow individuals after completing their treatment, identifying whether potential treatment options such as PARP inhibitors would be beneficial, and understand if other family members could be at risk for  cancer and allow them to undergo genetic testing.   We reviewed the characteristics, features and inheritance patterns of hereditary cancer syndromes. We also discussed genetic testing, including the appropriate family members to test, the process of testing, insurance coverage and turn-around-time for results. We discussed the implications of a negative, positive, carrier and/or variant of uncertain significant result. Ms. Slager  was offered a common hereditary cancer panel (36+ genes) and an expanded pan-cancer panel (70+ genes). Ms. Haller was informed of the benefits and limitations of each panel, including that expanded pan-cancer panels contain genes that do not have clear management guidelines at this point in time.  We also discussed that as the number of genes included on a panel increases, the chances of variants of uncertain significance increases. Ms. Jolly decided to pursue genetic testing for the [facility] BRCAPlus and CancerNext-Expanded +RNAinsight gene  panels.   Based on Ms. Mcduffee's personal and family history of cancer, she meets medical criteria for genetic testing. Despite that she meets criteria, she may still have an out of pocket cost. We discussed that if her out of pocket cost for testing is over $100, the laboratory will call and confirm whether she wants to proceed with testing.  If the out of pocket cost of testing is less than $100 she will be billed by the genetic testing laboratory.   We discussed that some people do not want to undergo genetic testing due to fear of genetic discrimination.  The Genetic Information Nondiscrimination Act (GINA) was signed into federal law in 2008. GINA prohibits health insurers and most employers from discriminating against individuals based on genetic information (including the results of genetic tests and family history information). According to GINA, health insurance companies cannot consider genetic information to be a preexisting condition, nor can they use it to make decisions regarding coverage or rates. GINA also makes it illegal for most employers to use genetic information in making decisions about hiring, firing, promotion, or terms of employment. It is important to note that GINA does not offer protections for life insurance, disability insurance, or long-term care insurance. GINA does not apply to those in the [employer], those who work for companies with less than 15 employees, and new life insurance or long-term disability insurance policies.  Health status due to a cancer diagnosis is not protected under GINA. More information about GINA can be found by visiting [email or web address].  We reviewed the characteristics, features and inheritance patterns of hereditary cancer syndromes. We also discussed genetic testing, including the appropriate family members to test, the process of testing, insurance coverage and turn-around-time for results. We discussed the implications of a negative, positive and/or  variant of uncertain significant result. In order to get genetic test results in a timely manner so that Ms. Peugh can use these genetic test results for surgical decisions, we recommended Ms. Ofallon pursue genetic testing for the [facility] BRCAPlus and CancerNext-Expanded +RNAinsight panels. The CancerNext-Expanded gene panel offered by [facility] and includes sequencing, rearrangement, and RNA analysis for the following 76 genes: AIP, ALK, APC, ATM, AXIN2, BAP1, BARD1, BMPR1A, BRCA1, BRCA2, BRIP1, CDC73, CDH1, CDK4, CDKN1B, CDKN2A, CEBPA, CHEK2, CTNNA1, DDX41, DICER1, ETV6, FH, FLCN, GATA2, LZTR1, MAX, MBD4, MEN1, MET, MLH1, MSH2, MSH3, MSH6, MUTYH, NF1, NF2, NTHL1, PALB2, PHOX2B, PMS2, POT1, PRKAR1A, PTCH1, PTEN, RAD51C, RAD51D, RB1, RET, RUNX1, SDHA, SDHAF2, SDHB, SDHC, SDHD, SMAD4, SMARCA4, SMARCB1, SMARCE1, STK11, SUFU, TMEM127, TP53, TSC1, TSC2, VHL, and WT1 (sequencing and deletion/duplication); EGFR, HOXB13, KIT, MITF, PDGFRA, POLD1, and POLE (sequencing  only); EPCAM and GREM1 (deletion/duplication only).   Based on Ms. Pence's personal and family history of cancer, she meets medical criteria for genetic testing. Despite that she meets criteria, she may still have an out of pocket cost.   PLAN: After considering the risks, benefits, and limitations, Ms. Walthall provided informed consent to pursue genetic testing and the blood sample was sent to [facility] for analysis of the BRCAPlus and CancerNext-Expanded +RNAinsight panels. Results should be available within approximately 7-10 days for the STAT panel and 2-3 weeks for the expanded panel, at which point they will be disclosed by telephone to Ms. Offerdahl's daughter, Casimer Bilis [phone]) as will any additional recommendations warranted by these results. Ms. Ventresca will receive a summary of her genetic counseling visit and a copy of her results once available. This information will also be available in Epic.   Lastly, we encouraged  Ms. Eshleman to remain in contact with cancer genetics annually so that we can continuously update the family history and inform her of any changes in cancer genetics and testing that may be of benefit for this family.   Ms. Cotterill questions were answered to her satisfaction today. Our contact information was provided should additional questions or concerns arise. Thank you for the referral and allowing [place] to share in the care of your patient.   Vassie Moment, MS, [facility] Licensed, [email or web address] phone: [phone]  45 minutes were spent on the date of the encounter in service to the patient including preparation, face-to-face consultation, documentation and care coordination.  The patient brought her daughter. Drs. Meliton Rattan, and/or Grady were available for questions, if needed..    _______________________________________________________________________ For Office Staff:  Number of people involved in session: 2 Was an Intern/ student involved with case: no

## 2023-02-28 NOTE — Progress Notes (Unsigned)
Cardiology Office Note:  .   Date:  02/28/2023  ID:  [name], DOB 03/14/1940, MRN [id number] PCP: Merri Brunette, MD  [facility] Providers Cardiologist:  Yates Decamp, MD {  History of Present Illness: .   [name] is a 83 y.o. 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  She was treated with anticoagulation. Xarelto was discontinued in 2022 through shared decision making due to echocardiogram revealing well-healed vegetation at the tip of the mitral valve.   Admitted Sept 2024 with progressive SOB, intermittent fevers, found with PNA and AFlutter  Had spontaneous CV to SR, started on [facility]  She saw cardiology APP 01/07/23, she was in SR, had run out of her dilt and Eliquis, refilled both, her dilt dose consolidated to  CR dilt and amlodipine stopped Planned for a 1 mo f/u   Today's visit is scheduled as pre- op assessment for  L mastectomy RCRI score is zero (0.4%) RPH gas addressed OAC hold for 2 days pre-op  ROS:   She is doing OK She lives on a second story apartment, walks up/down the steps a number of times a day Chronically when going up the stairs she gets a tightness in her chest, this is not new or escalating in any way and goes back to before her stress testing 2018. Has baseline SOB/DOE, also about her baseline, follows regularly with Dr. Lum Babe as well No rest CP, SOB, no difficulties on flat ground, will get winded with carrying heavy things, especially up the stairs. Runs after her grand kids, without trouble. No palpitations She had a fainting episode that led to her Sept admission, none otherwise No bleeding or signs of bleeding  Arrhythmia/AAD hx Aflutter found 10/11/22 (in setting of febrile illness  Studies Reviewed:  Marland Kitchen    EKG done today and reviewed by myself:  SR 83bpm   Echocardiogram: 10/12/2022  1. Left ventricular ejection fraction, by estimation, is 60 to 65%. The  left ventricle has normal function. The left ventricle has no regional  wall motion abnormalities. There is mild left ventricular hypertrophy.  Left ventricular diastolic parameters  are consistent with Grade II diastolic dysfunction (pseudonormalization).  Elevated left atrial pressure.   2. Right ventricular systolic function is normal. The right ventricular  size is normal. There is mildly elevated pulmonary artery systolic  pressure.   3. Left atrial size was mildly dilated.   4. Right atrial size was mildly dilated.   5. The mitral valve is normal in structure. Mild mitral valve  regurgitation. No evidence of mitral stenosis.   6. The aortic valve is tricuspid. Aortic valve regurgitation is trivial.  Aortic valve sclerosis is present, with no evidence of aortic valve  stenosis.   7. The inferior vena cava is normal in size with greater than 50%  respiratory variability, suggesting right atrial pressure of 3 mmHg.    Echocardiogram: 04/2022  1. Left ventricular ejection fraction, by estimation, is 60 to 65%. The  left ventricle has normal function. The left ventricle has no regional  wall motion abnormalities. There is mild concentric left ventricular  hypertrophy. Left ventricular diastolic  parameters are consistent with Grade I diastolic dysfunction (impaired  relaxation). Elevated left ventricular end-diastolic pressure.   2. Right ventricular systolic function is  normal. The right ventricular  size is normal.   3. There is severe calcification involving the anterior mitral valve  leaflet that extends into the chordae tendinae. The mitral valve is  degenerative. Trivial mitral valve regurgitation. No evidence of mitral  stenosis.   4. The aortic valve is normal in structure. Aortic valve regurgitation is  not  visualized. No aortic stenosis is present.   5. 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    Stress Testing:  Lexiscan myoview stress test 07/03/2016: 1. The resting electrocardiogram demonstrated normal sinus rhythm, normal resting conduction, PVC and normal rest repolarization. Stress EKG is non-diagnostic for ischemia as it a pharmacologic stress using Lexiscan. Stress symptoms included dyspnea. 2. Myocardial perfusion imaging is normal. Overall left ventricular systolic function was normal without regional wall motion abnormalities. The left ventricular ejection fraction was 75%.     Risk Assessment/Calculations:    Physical Exam:   VS:  There were no vitals taken for this visit.   Wt Readings from Last 3 Encounters:  02/18/23 146 lb 9.6 oz (66.5 kg)  01/07/23 149 lb (67.6 kg)  10/29/22 148 lb 6.4 oz (67.3 kg)    GEN: Well nourished, well developed in no acute distress NECK: No JVD; No carotid bruits CARDIAC: RRR, no murmurs, rubs, gallops RESPIRATORY:  CTA b/l without rales, slight end exp wheezing, no rhonchi  ABDOMEN: Soft, non-tender, non-distended EXTREMITIES:  No edema; No deformity  ASSESSMENT AND PLAN: .    Paroxysmal AFlutter CHA2DS2Vasc is 6, on eliquis, appropriately dosed No palpitations ? Triggered by acute illness  HTN Looks ok  Coronary Ca++ No clear anginal symptoms she has a chronic chest tightness for many years, ? ifpulmonary Neg stress test 2018  Pre-op assessment Low RCRI score reviewed with Dr. Jacinto Halim who knows her well No cardiac contraindication to mastectomy  OK to old Eliquis 2 days pre-op to resume when safe to do so afterwards     Dispo: back in 6 mo, sooner if needed  Signed, Sheilah Pigeon, PA-C

## 2023-03-01 ENCOUNTER — Encounter: Payer: Self-pay | Admitting: *Deleted

## 2023-03-01 ENCOUNTER — Telehealth: Payer: Self-pay | Admitting: Internal Medicine

## 2023-03-01 ENCOUNTER — Encounter: Payer: Self-pay | Admitting: Physician Assistant

## 2023-03-01 ENCOUNTER — Ambulatory Visit: Payer: 59 | Attending: Physician Assistant | Admitting: Physician Assistant

## 2023-03-01 VITALS — BP 120/72 | HR 83 | Ht 65.0 in | Wt 146.0 lb

## 2023-03-01 DIAGNOSIS — I4892 Unspecified atrial flutter: Secondary | ICD-10-CM | POA: Diagnosis not present

## 2023-03-01 DIAGNOSIS — Z01818 Encounter for other preprocedural examination: Secondary | ICD-10-CM

## 2023-03-01 DIAGNOSIS — I10 Essential (primary) hypertension: Secondary | ICD-10-CM

## 2023-03-01 NOTE — Telephone Encounter (Signed)
Check home covid test , call back if positive .  Push fluids and rest  Tylenol As needed   Mucinex DM Twice daily  As needed  cough/congestion  Ov if not improving or worsens .  Please contact office for sooner follow up if symptoms do not improve or worsen or seek emergency care

## 2023-03-01 NOTE — Telephone Encounter (Signed)
Called the pt and there was no answer and her VM box was full so not able to leave msg  Will call back

## 2023-03-01 NOTE — Patient Instructions (Addendum)
  Medication Instructions:   Your physician recommends that you continue on your current medications as directed. Please refer to the Current Medication list given to you today.   *If you need a refill on your cardiac medications before your next appointment, please call your pharmacy*   Lab Work:  NONE ORDERED  TODAY    If you have labs (blood work) drawn today and your tests are completely normal, you will receive your results only by: MyChart Message (if you have MyChart) OR A paper copy in the mail If you have any lab test that is abnormal or we need to change your treatment, we will call you to review the results.   Testing/Procedures: NONE ORDERED  TODAY       Follow-Up: At [facility], you and your health needs are our priority.  As part of our continuing mission to provide you with exceptional heart care, we have created designated Provider Care Teams.  These Care Teams include your primary Cardiologist (physician) and Advanced Practice Providers (APPs -  Physician Assistants and Nurse Practitioners) who all work together to provide you with the care you need, when you need it.  We recommend signing up for the patient portal called "MyChart".  Sign up information is provided on this After Visit Summary.  MyChart is used to connect with patients for Virtual Visits (Telemedicine).  Patients are able to view lab/test results, encounter notes, upcoming appointments, etc.  Non-urgent messages can be sent to your provider as well.   To learn more about what you can do with MyChart, go to [email or web address].    Your next appointment:   6 month(s)  Provider:    Yates Decamp, MD/APP     Other Instructions

## 2023-03-01 NOTE — Telephone Encounter (Signed)
Spoke with the pt's daughter, Casimer Bilis ok per DPR  Pt is c/o increased cough with white sputum and increased SOB x 2 days  She denies any wheezing or chest tightness  No fevers, aches Her 2 grandkids are sick with colds and she was exposed  She is taking mucinex dm bid No appts available  Please advise, thanks!  Allergies  Allergen Reactions   Ciprofloxacin Itching, Swelling and Other (See Comments)    Possibly causing tremors?   Lisinopril Cough   Crestor [Rosuvastatin] Other (See Comments)    Myalgia and back pain   Ofev [Nintedanib] Nausea Only

## 2023-03-01 NOTE — Telephone Encounter (Signed)
[name] daughter states patient having symptoms of runny nose, cough, shortness of breath and mucus. Pharmacy is [address] Casimer [name] phone number is [phone].

## 2023-03-02 DIAGNOSIS — R059 Cough, unspecified: Secondary | ICD-10-CM | POA: Diagnosis not present

## 2023-03-02 NOTE — Telephone Encounter (Signed)
I called and spoke with pt. I informed pt of Tammy's note. Pt verbalized understanding. NFN

## 2023-03-08 ENCOUNTER — Encounter: Payer: Self-pay | Admitting: Internal Medicine

## 2023-03-08 ENCOUNTER — Telehealth: Payer: Self-pay | Admitting: Genetic Counselor

## 2023-03-08 ENCOUNTER — Ambulatory Visit: Payer: 59 | Admitting: Internal Medicine

## 2023-03-08 ENCOUNTER — Telehealth: Payer: Self-pay | Admitting: Cardiology

## 2023-03-08 ENCOUNTER — Ambulatory Visit (INDEPENDENT_AMBULATORY_CARE_PROVIDER_SITE_OTHER): Payer: 59 | Admitting: Internal Medicine

## 2023-03-08 VITALS — BP 118/58 | HR 83 | Temp 97.4°F | Ht 66.75 in | Wt 148.0 lb

## 2023-03-08 DIAGNOSIS — J8489 Other specified interstitial pulmonary diseases: Secondary | ICD-10-CM

## 2023-03-08 DIAGNOSIS — M359 Systemic involvement of connective tissue, unspecified: Secondary | ICD-10-CM | POA: Diagnosis not present

## 2023-03-08 DIAGNOSIS — M329 Systemic lupus erythematosus, unspecified: Secondary | ICD-10-CM

## 2023-03-08 DIAGNOSIS — J84112 Idiopathic pulmonary fibrosis: Secondary | ICD-10-CM | POA: Diagnosis not present

## 2023-03-08 LAB — PULMONARY FUNCTION TEST
DL/VA % pred: 75 %
DL/VA: 2.99 ml/min/mmHg/L
DLCO cor % pred: 44 %
DLCO cor: 9.13 ml/min/mmHg
DLCO unc % pred: 44 %
DLCO unc: 9.13 ml/min/mmHg
FEF 25-75 Pre: 1.64 L/s
FEF2575-%Pred-Pre: 104 %
FEV1-%Pred-Pre: 67 %
FEV1-Pre: 1.5 L
FEV1FVC-%Pred-Pre: 110 %
FEV6-%Pred-Pre: 65 %
FEV6-Pre: 1.83 L
FEV6FVC-%Pred-Pre: 105 %
FVC-%Pred-Pre: 62 %
FVC-Pre: 1.83 L
Pre FEV1/FVC ratio: 82 %
Pre FEV6/FVC Ratio: 100 %

## 2023-03-08 NOTE — Patient Instructions (Addendum)
 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 History of systemic lupus erythematosus (SLE) (HCC)  M32.9         PFT stable 2019 -> 2023 -> 2025 NO night desats Oct 2024   Plan  -Return daytime oxygen  and night o2  - CMA to put order 03/08/2023  - do HRCT in 6 months    Follow-up-  6 mnths to see DR Bertrum Brodie but after CT  - might need discussion on esbriet or immuran or cellcept if there is progresion  -Symptom score and exercise Hypoxemia test at follow-up

## 2023-03-08 NOTE — Progress Notes (Signed)
 Spirometry/diffusion capacity performed today.

## 2023-03-08 NOTE — Telephone Encounter (Signed)
 I spoke to [name]'s daughter, [name] (per pt request), to review results of genetic testing. STAT panel did not identify any mutations known to increased the risk for cancer in the 13 genes included on this test. 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 We reviewed that we do not use VUS to make management decisions or identify at risk family members, but the lab will monitor the result and notify us  if they obtain new information on the significance of this variant  Results uploaded to labs tab. Full CancerNext-Expanded +RNAinsight panel still running, results expected in 1-2 weeks. Will re-contact when available. All questions answered at this time.

## 2023-03-08 NOTE — Telephone Encounter (Signed)
 Spoke with patient and she states she does not have any concerns at this time we can cancel appointment for tomorrow if you don't think she needs it

## 2023-03-08 NOTE — Patient Instructions (Signed)
 Spirometry/diffusion capacity performed today.

## 2023-03-08 NOTE — Progress Notes (Signed)
 I:06/03/2021 -   83 year old female, never smoked.  Past medical history significant for hypertension, ILD, hypothyroidism, history of asthma, systemic lupus.  Patient Dr. Bertrum [name], last seen in office on 07/03/2021 by Dr. [name] for community-acquired pneumonia.        Chief Complaint  Patient presents with   Follow-up      PFT performed today.  Pt states she has been doing okay since last visit and denies any real complaints. States that every so often she will become SOB.   HPI [name] 83 y.o. -returns for follow-up.  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  However symptomatically she was stable.  Therefore we decided to see her again with pulmonary function test.  Pulmonary function test shows continued stability [see below].  Symptom wise she is also stable without any change.  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  However in the 2021 European respiratory general review (https://err.ersjournals.com/content/30/162/210177#T1)  -UIP is excluded from this classification unless there is specific clinical and serologic features.  She does have serologic factors of positive SSA and positive ANA at 1: 320.  However she does not have specific clinical features particularly of Raynaud's or digital tip ulceration or digital tip fissuring or palmar telangiectasia early morning joint stiffness or Raynaud's or unexplained digital edema Gottron sign.  In talking to her she says her rheumatologist is not Dr. [name].  She is not sure if she has a specific autoimmune diagnosis.  But she does follow-up.  Last visit was March 2023.  I do not have the notes with me.  In the past she has been labeled as lupus before she moved to [place].  Within rheumatologist Dr. [name] her previous rheumatologist did not think she had any specific connective tissue  disease.  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  Therefore we discussed antifibrotic therapy again.  Explained UIP is by prognostic marker and the preventative role of antifibrotic's.  Explained side effects.  She is not sure she wants to do it and she wants to reflect on it.  We discussed potential clinical trials and she wants to reflect on this as well.   07/03/21- Dr. [name]     Chief Complaint  Patient presents with   Acute Visit      Increased SOB and cough x 2 wks. Went to ED 07/02/21- dx with right infiltrate and prescribed doxy but she is hesitant about taking due to her lupus. Her cough has been prod with white sputum.     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 Bertrum [name] and considering antifibrotic, diastolic dysfunction, DM, breast cancer, hypothyroid.   Cough nonstop over last couple of weeks, increased dyspnea. Cough is still productive, pleuritic pain. Seen in urgent care 6/1 (given 3d prednisone  which per pt was not helpful), 6/5 for cough (given tessalon  perles for presumed postviral cough), nasal congestion. Seen in [facility] ED yesterday given doxy for CAP. Covid negative. No fever, hemoptysis.    Otherwise pertinent review of systems is negative.    07/31/2021- Interim hx  Patient presents today for 1 month follow-up for CAP.   She was treated with Augmentin  and azithromycin . She is doing well today. Feels  better. Shortness of breath, cough and wheezing have resolved. She has chronic dyspnea with moderate exertion. No shortness of breath at rest or with simple ADLs. She has a rare dry cough. No oxygen  requirements.: OV with [name] for follow-up.  She had been seen by Dr. [name] 07/03/2021 and diagnosed with CAP.  She was treated with Augmentin  and azithromycin .  Feeling better.  Shortness of breath, cough and wheezing have resolved.  She has chronic dyspnea with moderate exertion.   No increase in baseline.  No oxygen  requirements.  Currently under surveillance not on Ofev .  Will follow-up with Dr. Bertrum [name] in October and discuss restarting.  Patient ended up contacting the office on 8/23 and wanted to go ahead and move forward with restarting antifibrotic therapy - restarted Ofev .   12/17/2021: Today-acute visit Patient presents today for acute visit via virtual visit with her daughter.  A few people in her family tested positive for RSV around 2 weeks ago.  She started having similar symptoms with cough, fatigue, headaches and sinus congestion.  Her symptoms have improved some but she continues to have a productive cough with yellow phlegm and feels like she is having a lot of chest congestion.  She also feels slightly more short of breath compared to her baseline.  Still having some headaches and nasal congestion.  Denies any fevers, chills, hemoptysis, lower extremity swelling.  She is eating and drinking well.  Has not had any GI symptoms.  Has not been monitoring her oxygen  levels.  She did go to urgent care on 11/11.  They swabbed her for COVID and flu which were both negative.  Did not perform any RSV testing.  No imaging was obtained.  She was discharged home with supportive care measures.   OV 01/13/2022  Subjective:  Patient ID: [name], female , DOB: 01-09-1941 , age 83 y.o. , MRN: [id number] , ADDRESS: [address] [place] [address] PCP [name], MD Patient Care Team: [name], MD as PCP - General (Family Medicine) [name], MD as Consulting Physician (Rheumatology)  This Provider for this visit: Treatment Team:  Attending Provider: [name], MD  01/13/2022 -   Chief Complaint  Patient presents with   Follow-up    PFT performed today.  Pt states she is about the same since last visit. States she has been sick twice recently.     HPI [name] 83 y.o. -returns for follow-up.  After my last visit she did  call in and said that she was open to taking antifibrotic's.  We started this in October 2023.  However she took only for few days and then stopped because of abdominal pain and nausea and other GI side effects.  Her daughter [name] advised her also not to take the medicines after reading the side effects.  She tells me today that she does not want to go back on taking nintedanib.She tells me overall she is stable for the year of 2023.  Daughter wanted know what other options there.  We discussed pirfenidone.  Daughter was interested in patient taking pirfenidone but patient rejected this idea because of significant GI side effects even with pirfenidone.  Daughter was also interested in knowing about other possible therapeutic options.  Explained to her that only nintedanib and pirfenidone exist.  Everything else outside this would be a clinical trial.  There are some upcoming trials with inhaled pirfenidone and other agents.  Discussed the concept of clinical trials.  They are  processing this information.     Daughter was interested in knowing the natural history of pulmonary fibrosis and the future prognosis.  Did explain progressive phenotype and the likelihood of ending up on oxygen  class III-4 dyspnea in the future.   Of note the daughter states she got exposed to kids with RSV and has been fatigued this was right before Thanksgiving 2023.  Currently patient feels fine but the daughter feels she is more fatigued.  Walking desaturation test shows that she is more fatigued and she cannot do her 3 labs but there is no change in the heart rate n or pulse ox compared to the past.  She denies any fever or chills or sputum production or worsening cough.      OV 03/26/2022  Subjective:  Patient ID: [name], female , DOB: [birth year] , age 50 y.o. , MRN: [id number] , ADDRESS: [address] [place] [address] PCP [name], MD Patient Care Team: [name], MD as PCP - General  (Family Medicine) [name], MD as Consulting Physician (Rheumatology)  This Provider for this visit: Treatment Team:  Attending Provider: [name], MD   Chief Complaint  Patient presents with   Follow-up    SOB seems worse.  Hard to breathe at times.  Needs albuterol  MDI.      HPI [name] 83 y.o. -returns for follow-up for the above issue.  Initially she said she was doing stable but I think continue to talk to her she said that she might be feeling a little more short of breath on exertion relieved by rest than before.  She then said that was just a single episode 2 weeks ago when she walked to the truck she got quite short of breath and had to stop but then it resolved and she feels baseline but in evaluation of the symptom score sheet her symptoms are much worse than previous visit in December 2023.  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  And she had previously failed nintedanib and she already has slowly progressive disease.  The technician CMA walked her today for single pulse oximetry she was not sure about the accuracy and the reading but it appears patient might be desaturating.  Her last CT scan was in fall 2022.  She was supposed to have a CT scan at this visit but I am unclear why this has not happened and patient's not sure either.  Patient does have does not want to have pulmonary function test.  Her last echocardiogram was also in September 2022.  She is at risk candidate for pulmonary hypertension.  She denies any fever or chills or nausea vomiting or cough or wheezing no changes in medicines no new medical issues.  She also has baseline anemia and this has not been rechecked in a while.  Based on test review.   CT Chest data - HRCT OCt 2022  Narrative & Impression  CLINICAL DATA:  Interstitial lung disease, dyspnea on exertion, history of left breast cancer and radiation   EXAM: CT CHEST WITHOUT CONTRAST    TECHNIQUE: Multidetector CT imaging of the chest was performed following the standard protocol without intravenous contrast. High resolution imaging of the lungs, as well as inspiratory and expiratory imaging, was performed.   COMPARISON:  07/27/2019 02/11/2018, 02/15/2017, 07/22/2016   FINDINGS: Cardiovascular: Aortic atherosclerosis. Normal heart size. Three-vessel coronary artery calcifications. No pericardial effusion.   Mediastinum/Nodes: No enlarged mediastinal, hilar, or axillary  lymph nodes. Small hiatal hernia. Thyroid  gland, trachea, and esophagus demonstrate no significant findings.   Lungs/Pleura: Redemonstrated moderate pulmonary fibrosis in a pattern with apical to basal gradient, featuring irregular peripheral interstitial opacity, septal thickening, traction bronchiectasis, subpleural bronchiolectasis, and areas of honeycombing at the lung bases. Fibrotic findings are slightly worsened compared to prior examination dated 07/27/2019 and clearly worsened over time on examinations dating back to 07/22/2016. No significant air trapping on expiratory phase imaging. No pleural effusion or pneumothorax.   Upper Abdomen: No acute abnormality.   Musculoskeletal: Calcified mass of the medial left breast, unchanged (series 2, image 89). No suspicious bone lesions identified.   IMPRESSION: 1. Redemonstrated moderate pulmonary fibrosis in a pattern with apical to basal gradient, featuring irregular peripheral interstitial opacity, septal thickening, traction bronchiectasis, subpleural bronchiolectasis, and areas of honeycombing at the lung bases. Fibrotic findings are slightly worsened compared to prior examination dated 07/27/2019 and clearly worsened over time on examinations dating back to 07/22/2016. Findings are consistent with UIP per consensus guidelines: Diagnosis of Idiopathic Pulmonary Fibrosis: An Official ATS/ERS/JRS/ALAT Clinical Practice Guideline. Am Annie Barton Crit Care Med Vol 198, Iss 5, [id number], Sep 26 2016. 2. Coronary artery disease.   Aortic Atherosclerosis (ICD10-I70.0).     Electronically Signed   By: Fredricka Jenny M.D.   On: 11/11/2020 09:12     OV 06/01/2022  Subjective:  Patient ID: [name], female , DOB: 03/17/1940 , age 18 y.o. , MRN: [id number] , ADDRESS: [address] [place] [address] PCP [name], MD Patient Care Team: [name], MD as PCP - General (Family Medicine) [name], MD as Consulting Physician (Rheumatology)  This Provider for this visit: Treatment Team:  Attending Provider: [name], MD    06/01/2022 -   Chief Complaint  Patient presents with   Follow-up    F/up on Echo, HRCT. HRCT not done.         03/26/2022 -    HPI [name] 83 y.o. -returns for follow-up.9 history is gained from talking to the patient but later also from talking to the daughter.  At first the patient states she was feeling great.  She says after her recent hospitalization late March 2024 through early April 2024 for dehydration related to UTI and presyncope she is actually doing better.  She is going dancing.  Her shortness of breath is actually improved and energy levels are better.)  But later when I called the daughter the daughter said the whole reason the appointment was made with us  because she is having worsening cough.  Then the patient later admitted the cough is indeed worse and she has some clear sputum but the patient also said that the cough got worse during the hospital stay [CT suggested some pneumonia] but is currently improving and getting better.  Daughter denied cough is better. Denied dysphagia. There was some mold exposure in the houseThere is no edema there is no orthopnea no paroxysmal nocturnal dyspnea.  Admitted to a sit/stand test x 12 times.  She did not get dyspneic and she did not desaturate.  The results are below.  She was supposed to have a  high-resolution CT chest but this did not happen because of the hospitalization and her having a CT angio chest.  Will proceed today and get chest x-ray and labs  At this point in time I think symptoms are stable exercise hypoxemia is absent.  I think it is best we continue to monitor ILD before  committing to antifibrotic's particularly given her age and previous intolerance to nintedanib and having chronic kidney disease.    ECHO 05/08/22   IMPRESSIONS     1. Left ventricular ejection fraction, by estimation, is 60 to 65%. The  left ventricle has normal function. The left ventricle has no regional  wall motion abnormalities. There is mild concentric left ventricular  hypertrophy. Left ventricular diastolic  parameters are consistent with Grade I diastolic dysfunction (impaired  relaxation). Elevated left ventricular end-diastolic pressure.   2. Right ventricular systolic function is normal. The right ventricular  size is normal.   3. There is severe calcification involving the anterior mitral valve  leaflet that extends into the chordae tendinae. The mitral valve is  degenerative. Trivial mitral valve regurgitation. No evidence of mitral  stenosis.   4. The aortic valve is normal in structure. Aortic valve regurgitation is  not visualized. No aortic stenosis is present.   5. 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    OV 10/29/2022  Subjective:  Patient ID: [name], female , DOB: 04/29/1940 , age 20 y.o. , MRN: [id number] , ADDRESS: [address] [place] [address] PCP [name], MD Patient Care Team: [name], MD as PCP - General (Family Medicine) [name], MD as Consulting Physician (Rheumatology)  This Provider for this visit: Treatment Team:  Attending Provider: [name], MD  10/29/2022 -   Chief Complaint  Patient presents with   Follow-up    Recent PNA 10/11/22. Breathing is doing well and she is not bothered by a cough.       HPI [name] 83 y.o. -returns for follow-up.  Presents with her daughter.  Daughter gives most of the history.  History also from review of the records.  Admitted for 4 days mid September 2024 due to sepsis related to UTI/pneumonia/acute kidney injury anemia and proximal atrial flutter.  Chart review shows elevation in BNP drop in hemoglobin.  Echocardiogram shows diastolic dysfunction.  She is deconditioned at discharge sent home on oxygen  at discharge.  CT angio was negative for PE but suggestive of multifocal pneumonia..  Since then she is 75% better and is progressing well. Gets home physical therapy.  They not sure about the need for oxygen .  In fact today room air pulse ox at rest was 96%.  After sit/stand test 10 times she did not desaturate.  OV 03/08/2023  Subjective:  Patient ID: [name], female , DOB: 1941-01-16 , age 18 y.o. 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 [name] for lupus and is on Plaquenil .  Lupus diagnosed given 2016 - dry eyes/mouth              - on supportive care due to goals of  care   -Briefly tried nintedanib October 2023 and stopped because of GI side effects    Follow-up mild  associated emphysema on CT scan              - on spiriva  - not taking   ECho feb and sept 2022 -             - gr1 ddx. Normal PA/RV and LV             - similar sept 2022  Hospitalization mid September 2024 due to sepsis related to UTI/pneumonia acute kidney injury, anemia and paroxysmal atrial flutter  Breast cancer recurrence January 2025 left breast.  03/08/2023 -   Chief Complaint  Patient presents with   Follow-up    Review spirometry from today.  Doing well.     HPI [name] 83 y.o. -returns for follow-up.  She was hospitalized in September 2024 for sepsis.  After that I saw in October 2024.  She is now coming here for follow-up after getting pulmonary function test.  His shortness of breath is somewhat improved and back to baseline.  She had pulmonary function test and this shows multiyear stability [see below].  It is very reassuring.  In the interim no new medical problems no hospitalizations no ER visits no surgeries.   Review of the medical records indicate that in January 2025 she saw oncology because of a history of breast cancer with lumpectomy and radiation therapy in 2009 but her recent CT scan did show a breast mass.  With Dr. Arno Bibles  she indicated that she is more interested in holistic therapies.  She had a small 1.1 cm mass that appears aggressive and histology.    SYMPTOM SCALE - ILD 06/14/2019   08/17/2019   05/07/2020   02/14/2021   06/03/2021   07/31/2021  01/13/2022  03/26/2022  06/01/2022  10/29/2022  03/08/2023   O2 use ra ra ra ra ra RA ra ra ra ra ra  Shortness of Breath 0 -> 5 scale with 5 being worst (score 6 If unable to do)                At rest 0 2 0 0 2 0 0 2 0 5  0  Simple tasks - showers, clothes change, eating, shaving 1 2 1 4 1 1 1 4  0 5 2  Household (dishes, doing bed, laundry) 3 3 4 4 1  0 2 4 2 5 3   Shopping 5 4 5 5 3 5 3 4 2 4 3   Walking level at own pace 3 3 3 3 2  0 2 3 1 4 2   Walking up Stairs 5 5 4 5 5 5 5 5  0 3 5  Total (30-36)  Dyspnea Score 17 19 17 21 14 11 13 22 5 26 15   How bad is your cough? 2   1 0 0 1 1 0 5 x 3  How bad is your fatigue 3   3 3 1 2 3  0 3 x 3  How bad is nausea 00   0 0 0 0 0 0 0 xx 0  How bad is vomiting?   0   0 0 0 0 0 0 0 x 0  How bad is diarrhea? 0   0 0 0 0 0 0 0 x 0  How bad is anxiety? 0 0 0 2 0 3 0 0 1  2  How bad is depression 0 1 0  3 0 1 0 1 0 xx x       Simple office walk 185 feet x  3 laps goal with forehead probe 08/18/2017       02/15/2018   06/14/2019   05/07/2020   02/14/2021   06/03/21   07/31/2021  01/13/2022  06/01/2022  10/29/2022   O2 used *room air Room air ra ra ra   RA ra ra ra  Number laps completed 3 3 3 3 3   3 2  of 3 Sit stand x 12 times Sit stand x 10  Comments about pace Normal pace Slow pace Slow pace slow Slow pace   Slow pace  Slow pace    Resting Pulse Ox/HR 100% and 67/min 100% and 73/min 100% and 63/mi 100% and 67/min 100% and 64   100% and 71 100% and HR 68 98% ad HR 88 98% and HR 71  Final Pulse Ox/HR 100% and 91/min 100% and 96/min 92% and 84/mi 100% and 91/ 100% and 91   98% and 88 98% and HR 92 96% and HR 86 96% and HR 71  Desaturated </= 88% no No  no no no   No no    Desaturated <= 3% points no no Yues, 8 ponts no no   No no    Got Tachycardic >/= 90/min yes yes no yes yes   No no    Symptoms at end of test Mild dyspnea Mild dyspnea Mild dyspnea Mild dyspnea Mild dyspnea   Mild dyspnea Stopped early due to fatigue No dyspnea   Miscellaneous comments x x     x   x         PFT     Latest Ref Rng & Units 03/08/2023    1:11 PM 01/13/2022   12:32 PM 06/03/2021   10:37 AM 02/14/2021   12:36 PM 05/18/2017   12:44 PM  ILD indicators  FVC-Pre L 1.83  P  1.90  1.84  1.61   FVC-Predicted Pre % 62  P  80  76  63   TLC L  4.89      TLC Predicted %  89      DLCO uncorrected ml/min/mmHg 9.13  P 18.06  9.75  9.78    DLCO UNC %Pred % 44  P 87  47  47    DLCO Corrected ml/min/mmHg 9.13  P 18.06  10.11  9.78    DLCO COR %Pred % 44  P 87  48  47      P  Preliminary result      LAB RESULTS last 96 hours No results found.  LAB RESULTS last 90 days Recent Results (from the past 2160 hours)  Basic Metabolic Panel (BMET)     Status: Abnormal   Collection Time: 01/07/23  2:39 PM  Result Value Ref Range   Glucose 102 (H) 70 - 99 mg/dL   BUN 12 8 - 27 mg/dL   Creatinine, Ser 6.57 (H) 0.57 - 1.00 mg/dL   eGFR 48 (L) >84 ON/GEX/5.28   BUN/Creatinine Ratio 10 (L) 12 - 28   Sodium 138 134 - 144 mmol/L   Potassium 4.3 3.5 - 5.2 mmol/L   Chloride 102 96 - 106 mmol/L   CO2 23 20 - 29 mmol/L   Calcium  9.6 8.7 - 10.3 mg/dL  CBC     Status: None   Collection Time: 01/07/23  2:39 PM  Result Value Ref Range   WBC 6.0 3.4 -  10.8 x10E3/uL   RBC 4.07 3.77 - 5.28 x10E6/uL   Hemoglobin 11.7 11.1 - 15.9 g/dL   Hematocrit 95.2 84.1 - 46.6 %   MCV 89 79 - 97 fL   MCH 28.7 26.6 - 33.0 pg   MCHC 32.1 31.5 - 35.7 g/dL   RDW 32.4 40.1 - 02.7 %   Platelets 257 150 - 450 x10E3/uL  Surgical pathology     Status: None   Collection Time: 01/15/23 12:00 AM  Result Value Ref Range   SURGICAL PATHOLOGY      SURGICAL PATHOLOGY [facility] [address] [place], [place] [address] Telephone [phone] or [phone] Fax [phone]  REPORT OF SURGICAL PATHOLOGY   Accession #: [id number] Patient Name: [name], [name] Visit # : [id number]  MRN: [id number] Physician: Dimitrova-Koutleva, Dobrinka DOB/Age 06-Nov-1940 (Age: 89) Gender: F Collected Date: 01/15/2023 Received Date: 01/15/2023  FINAL DIAGNOSIS       1. Breast, left, needle core biopsy, 10 o' clock mass :       -  INVASIVE DUCTAL CARCINOMA      -  SEE COMMENT       DATE SIGNED OUT: 01/21/2023 ELECTRONIC SIGNATURE : Randal Bury Md, Dawn, Pathologist, Electronic Signature  MICROSCOPIC DESCRIPTION 1. Based on the biopsy, the carcinoma appears Nottingham grade 3 of 3 and measures 1.1 cm in greatest linear extent. Prognostic markers (ER/PR/ki-67/HER2) are  pending and will be reported in an addendum.  Dr.Pickelsimer reviewed the case and agrees with the above diagnosis.  These  results were called to [facility] on January 21, 2023.  CASE COMMENTS STAINS USED IN DIAGNOSIS: H&E-2 H&E-3 H&E-4 H&E *RECUT 1 SLIDE Stains used in diagnosis 1 ER - NOACIS, 1 Her2 by IHC, 1 KI-67-NO ACIS, 1 PR - NOACIS IHC stains are performed on formalin fixed,paraffin embedded tissue using a 3,3"diaminobenzidine (DAB) chromogen and Leica Bond Autostainer System. The staining intensity of the nucleus is score manually and is reported as the percentage of tumor cell nuclei demonstrating specific nuclear staining. The specimens are fixed in 10% Neutral Formalin for at least 6 hours and up to 72hrs. These tests are validated on decalcified tissue. Results should be interpreted with caution given the possibility of false negative results on decalcified specimens. Antibody Clones are as follows ER-clone 31F, PR-clone 16, Ki67- clone MM1.Some of these immunohistochemical stains may have been developed and the performance characteristics determined b y [facility]. IHC scores are reported using ASCO/CAP scoring criteria.  An IHC Score of 0 or 1+  is NEGATIVE for HER2, 3+ is POSITIVE for HER2, and 2+ is EQUIVOCAL. Equivocal results are reflexed to either FISH or IHC testing. Specimens are fixed in 10% Neutral Buffered Formalin for at least 6 hours and up to 72 hours. These tests have not be validated on decalcified tissue.  Results should be interpreted with caution given the possibility of false negative results on decalcified specimens. Antibody Clone for HER2 is 4B5 (PATHWAY). Some of these immunohistochemical stains may have been developed and the performance characteristics determined by [facility].  Some may not have been cleared or approved by the U.S. Food and Drug Administration.  The FDA has determined that such  clearance or approval is not necessary.  This test is used for clinical purposes.  It should not be regarded as investigational or for research.  This laboratory is certified und er the Clinical Laboratory Improvement Amendments of 1988 (CLIA-88) as qualified to perform high  complexity clinical laboratory testing.  ADDENDUM 1A) Breast, left, needle core biopsy, 10 o'clock mass PROGNOSTIC INDICATORS  Results: IMMUNOHISTOCHEMICAL AND MORPHOMETRIC ANALYSIS PERFORMED MANUALLY The tumor cells are NEGATIVE for Her2 (0). Estrogen Receptor:  70%, POSITIVE, WEAK-MODERATE STAINING INTENSITY Progesterone Receptor:  0%, NEGATIVE Proliferation Marker Ki67:  90% COMMENT:  The negative hormone receptor study(ies) in this case has an internal positive control.  REFERENCE RANGE ESTROGEN RECEPTOR NEGATIVE     0% POSITIVE       =>1% REFERENCE RANGE PROGESTERONE RECEPTOR NEGATIVE     0% POSITIVE        =>1% All controls stained appropriately [place]les[place] Md, Fred , Sports administrator, International aid/development worker ( Signed 12 26 2024)   CLINICAL HISTORY  SPECIMEN(S) OBTAINED 1. Breast, left, needle core biopsy, 10 O' Clock Mass  SPECIMEN COMMENTS: 1. TIF: 2:20pm S PECIMEN CLINICAL INFORMATION: 1. Mass seen on mammo    Gross Description 1. In formalin labeled [place], Eidem and "left breast 10 o'clock 2 cm fn" (TIF 1420 and CIT less than 2 minutes) are four cores of gray white to yellow soft to firm tissue which range from 0.4 x 0.1 x 0.1 cm to 1.1 x 0.1 x 0.1 cm, submitted in one block (SSW:kh 01/18/23)        Report signed out from the following location(s) [facility] [address], [place] [address] CLIA #: [id number]  [facility] [address] [place], [place] [address] CLIA #: [id number]   Genetic Screening Order     Status: None   Collection Time: 02/26/23 10:27 AM  Result Value Ref Range   Genetic Screening Order Collected by Laboratory      Comment: Performed at [facility], [address] [place], [place] [address]  Pulmonary function test     Status: None (Preliminary result)   Collection Time: 03/08/23  1:11 PM  Result Value Ref Range   FVC-Pre 1.83 L   FVC-%Pred-Pre 62 %   FEV1-Pre 1.50 L   FEV1-%Pred-Pre 67 %   FEV6-Pre 1.83 L   FEV6-%Pred-Pre 65 %   Pre FEV1/FVC ratio 82 %   FEV1FVC-%Pred-Pre 110 %   Pre FEV6/FVC Ratio 100 %   FEV6FVC-%Pred-Pre 105 %   FEF 25-75 Pre 1.64 L/sec   FEF2575-%Pred-Pre 104 %   DLCO unc 9.13 ml/min/mmHg   DLCO unc % pred 44 %   DLCO cor 9.13 ml/min/mmHg   DLCO cor % pred 44 %   DL/VA 9.62 ml/min/mmHg/L   DL/VA % pred 75 %         has a past medical history of Acute bronchitis (12/17/2021), Acute cystitis (02/20/2019), Breast cancer (HCC), Diabetes mellitus without complication (HCC), Hypertension, Hypothyroidism, Lupus, and Pneumonia (07/31/2021).   reports that she has never smoked. She quit smokeless tobacco use about 8 years ago.  Her smokeless tobacco use included snuff.  Past Surgical History:  Procedure Laterality Date   BREAST BIOPSY Left 01/15/2023   US  LT BREAST BX W LOC DEV 1ST LESION IMG BX SPEC US  GUIDE 01/15/2023 [facility]MO[facility]   BREAST LUMPECTOMY Left 2009   IR 3D INDEPENDENT WKST  05/29/2021   IR ANGIO INTRA EXTRACRAN SEL COM CAROTID INNOMINATE BILAT MOD SED  05/29/2021   IR ANGIO VERTEBRAL SEL VERTEBRAL BILAT MOD SED  05/29/2021    Allergies  Allergen Reactions   Ciprofloxacin Itching, Swelling and Other (See Comments)    Possibly causing tremors?   Lisinopril  Cough   Crestor  [  Rosuvastatin ] Other (See Comments)    Myalgia and back pain   Ofev  [Nintedanib] Nausea Only    Immunization History  Administered Date(s) Administered   Fluad Trivalent(High Dose 65+) 10/29/2022   Influenza, High Dose Seasonal PF 11/09/2016, 10/27/2018, 10/26/2020   Influenza,inj,Quad PF,6+ Mos 10/27/2015   PFIZER(Purple Top)SARS-COV-2  Vaccination 04/22/2019, 05/13/2019, 01/15/2020   Pneumococcal Conjugate-13 07/11/2013, 02/15/2018   Pneumococcal Polysaccharide-23 03/18/2020   Tdap 03/20/2021    Family History  Problem Relation Age of Onset   Prostate cancer Father 20 - 72       metaststic   Asthma Brother    Breast cancer Daughter 20     Current Outpatient Medications:    Accu-Chek Softclix Lancets lancets, SMARTSIG:Topical, Disp: , Rfl:    Blood Glucose Monitoring Suppl (ACCU-CHEK GUIDE) w/Device KIT, USE TO CHECK BLOOD SUGAR AS DIRECTED BY PHYSICIAN., Disp: , Rfl:    dextromethorphan -guaiFENesin  (MUCINEX  DM) 30-600 MG 12hr tablet, Take 1 tablet by mouth 2 (two) times daily., Disp: 30 tablet, Rfl: 0   diclofenac  Sodium (VOLTAREN ) 1 % GEL, Apply 1 application. topically 4 (four) times daily as needed (pain)., Disp: , Rfl:    diltiazem  (CARDIZEM  CD) 120 MG 24 hr capsule, Take 1 capsule (120 mg total) by mouth daily., Disp: 90 capsule, Rfl: 3   ELIQUIS  5 MG TABS tablet, TAKE 1 TABLET BY MOUTH 2 TIMES DAILY, Disp: 60 tablet, Rfl: 3   gabapentin  (NEURONTIN ) 300 MG capsule, Take 300 mg by mouth 3 (three) times daily., Disp: , Rfl:    glimepiride  (AMARYL ) 1 MG tablet, Take 1 mg by mouth daily with breakfast., Disp: , Rfl:    glucose blood test strip, USE TO CHECK BLOOD SUGAR ONCE DAILY ALTERNATING BETWEEN BEFORE BREAKFAST AND BEFORE DINNER, Disp: , Rfl:    hydroxychloroquine  (PLAQUENIL ) 200 MG tablet, Take 200 mg by mouth daily., Disp: , Rfl:    levothyroxine  (SYNTHROID ) 100 MCG tablet, Take 100 mcg by mouth daily., Disp: , Rfl:    omeprazole (PRILOSEC) 20 MG capsule, Take 20 mg by mouth every morning., Disp: , Rfl:    spironolactone  (ALDACTONE ) 25 MG tablet, Take 0.5 tablets (12.5 mg total) by mouth daily., Disp: 45 tablet, Rfl: 3      Objective:   Vitals:   03/08/23 1410  BP: (!) 118/58  Pulse: 83  Temp: (!) 97.4 F (36.3 C)  TempSrc: Oral  SpO2: 96%  Weight: 148 lb (67.1 kg)  Height: 5' 6.75" (1.695 m)     Estimated body mass index is 23.35 kg/m as calculated from the following:   Height as of this encounter: 5' 6.75" (1.695 m).   Weight as of this encounter: 148 lb (67.1 kg).  @WEIGHTCHANGE @  [employer]   03/08/23 1410  Weight: 148 lb (67.1 kg)     Physical Exam   General: No distress. Looks well O2 at rest: no Cane present: no Sitting in wheel chair: no Frail: no Obese: no Neuro: Alert and Oriented x 3. GCS 15. Speech normal Psych: Pleasant Resp:  Barrel Chest - no.  Wheeze - no, Crackles -  YES BASE, No overt respiratory distress CVS: Normal heart sounds. Murmurs - no Ext: Stigmata of Connective Tissue Disease - no HEENT: Normal upper airway. PEERL +. No post nasal drip        Assessment:       ICD-10-CM   1. Interstitial lung disease due to connective tissue disease (HCC)  J84.89 CT Chest High Resolution   M35.9  2. UIP (usual interstitial pneumonitis) (HCC)  J84.112 CT Chest High Resolution    3. History of systemic lupus erythematosus (SLE) (HCC)  M32.9 CT Chest High Resolution         Plan:     Patient Instructions     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 History of systemic lupus erythematosus (SLE) (HCC)  M32.9         PFT stable 2019 -> 2023 -> 2025 NO night desats Oct 2024   Plan  -Return daytime oxygen  and night o2  - CMA to put order 03/08/2023  - do HRCT in 6 months    Follow-up-  6 mnths to see DR [name] but after CT  - might need discussion on esbriet or immuran or cellcept if there is progresion  -Symptom score and exercise Hypoxemia test at follow-up     FOLLOWUP Return in about 6 months (around 09/05/2023) for 15 min visit, with Dr [name], after HRCT chest.

## 2023-03-08 NOTE — Addendum Note (Signed)
 Addended byRefugio Cantor M on: 03/08/2023 04:44 PM   Modules accepted: Orders

## 2023-03-08 NOTE — Telephone Encounter (Signed)
 I can see her in 6 months. Thanks for arranging EP consult

## 2023-03-09 ENCOUNTER — Ambulatory Visit: Payer: 59 | Admitting: Cardiology

## 2023-03-10 ENCOUNTER — Encounter: Payer: Self-pay | Admitting: Genetic Counselor

## 2023-03-10 ENCOUNTER — Telehealth: Payer: Self-pay | Admitting: Genetic Counselor

## 2023-03-10 ENCOUNTER — Ambulatory Visit: Payer: Self-pay | Admitting: Genetic Counselor

## 2023-03-10 ENCOUNTER — Telehealth: Payer: Self-pay | Admitting: Cardiology

## 2023-03-10 DIAGNOSIS — Z1379 Encounter for other screening for genetic and chromosomal anomalies: Secondary | ICD-10-CM

## 2023-03-10 DIAGNOSIS — Z170 Estrogen receptor positive status [ER+]: Secondary | ICD-10-CM

## 2023-03-10 NOTE — Telephone Encounter (Signed)
Eliezer Bottom E  You; [place], [place] L7 minutes ago (3:47 PM)   GK EKG has been faxed to [phone], Attn: [facility] / Triage, thanks.

## 2023-03-10 NOTE — Progress Notes (Signed)
HPI:  [name] was previously seen in the [facility] due to a personal and family history of cancer and concerns regarding a hereditary predisposition to cancer. Please refer to our prior cancer genetics clinic note for more information regarding our discussion, assessment and recommendations, at the time. Per patient request, [name] recent genetic test results were disclosed to her daughter, [name], as were recommendations warranted by these results. These results and recommendations are discussed in more detail below.  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 1.1 cm probable cystic appearing mass lies slightly above and lateral to the lumpectomy bed calcification. Aspiration to confirm this as a benign cyst is recommended.   01/08/2023 Breast US   Ultrasound-guided cyst aspiration of the 1.1 cm presumed complicated cyst in the left breast at 10 o'clock, 2 cm the nipple. If this does not aspirate, ultrasound-guided core needle biopsy would be indicated.     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 The CancerNext-Expanded gene panel offered by [facility] and includes sequencing, rearrangement, and RNA analysis for the following 76 genes: AIP, ALK, APC, ATM, AXIN2, BAP1, BARD1, BMPR1A, BRCA1, BRCA2, BRIP1, CDC73, CDH1, CDK4, CDKN1B, CDKN2A, CEBPA, CHEK2, CTNNA1, DDX41, DICER1, ETV6, FH, FLCN, GATA2, LZTR1, MAX, MBD4, MEN1, MET, MLH1, MSH2, MSH3, MSH6, MUTYH, NF1, NF2, NTHL1, PALB2, PHOX2B, PMS2, POT1, PRKAR1A, PTCH1, PTEN, RAD51C, RAD51D, RB1, RET, RUNX1, SDHA, SDHAF2, SDHB, SDHC, SDHD, SMAD4, SMARCA4, SMARCB1, SMARCE1, STK11, SUFU, TMEM127, TP53, TSC1, TSC2, VHL, and WT1 (sequencing and deletion/duplication); EGFR, HOXB13, KIT, MITF, PDGFRA, POLD1, and POLE (sequencing only); EPCAM and GREM1 (deletion/duplication only). Report date 03/08/23.      FAMILY HISTORY:  We obtained a detailed, 4-generation family history.  Significant diagnoses are listed below: Family History  Problem Relation Age of Onset   Prostate cancer Father 71 - 36       metaststic   Asthma Brother    Breast cancer Daughter 27   [name] is unaware of previous family history of genetic testing for hereditary cancer risks. There is no reported Ashkenazi Jewish ancestry. There is no known consanguinity.   [name] reports her daughter was diagnosed with breast cancer at age 81. She reports her father was diagnosed with prostate cancer age age 71, and died from his cancer. She reports a maternal first cousin once removed diagnosed with breast cancer in her 7s.      GENETIC TEST RESULTS: Genetic testing reported out on 03/08/23 through the [facility] CancerNext-Expanded +RNAinsight panel and 03/07/23 through the [facility] STAT panel found no pathogenic mutations. The CancerNext-Expanded gene panel offered by [facility] and includes sequencing, rearrangement, and RNA analysis for the following 76 genes: AIP, ALK, APC, ATM, AXIN2, BAP1, BARD1,  BMPR1A, BRCA1, BRCA2, BRIP1, CDC73, CDH1, CDK4, CDKN1B, CDKN2A, CEBPA, CHEK2, CTNNA1, DDX41, DICER1, ETV6, FH, FLCN, GATA2, LZTR1, MAX, MBD4, MEN1, MET, MLH1, MSH2, MSH3, MSH6, MUTYH, NF1, NF2, NTHL1, PALB2, PHOX2B, PMS2, POT1, PRKAR1A, PTCH1, PTEN, RAD51C, RAD51D, RB1, RET, RUNX1, SDHA, SDHAF2, SDHB, SDHC, SDHD, SMAD4, SMARCA4, SMARCB1, SMARCE1, STK11, SUFU, TMEM127, TP53, TSC1, TSC2, VHL, and WT1 (sequencing and deletion/duplication); EGFR, HOXB13, KIT, MITF, PDGFRA, POLD1, and POLE (sequencing only); EPCAM and GREM1 (deletion/duplication only). The test report has been scanned into EPIC and is located under the Molecular Pathology section of the Results Review tab.  A portion of the result report is included below for reference.     We discussed with [name] family that because current genetic testing is not perfect, it is possible there may be a gene mutation in one of these genes that current testing cannot detect, but that chance is small.  We also discussed, that there could be another gene that has not yet been discovered, or that we have not yet tested, that is responsible for the cancer diagnoses in the family. It is also possible there is a hereditary cause for the cancer in the family that [name] did not inherit and therefore was not identified in her testing. Family members diagnosed with cancer may benefit from their own testing. Therefore, it is important to remain in touch with cancer genetics in the future so that we can continue to offer [name] the most up to date genetic testing.   Genetic testing did identify two variants of uncertain significance (VUS) - one in the BRCA2 gene called p.V7846N (c.4813G>A) and a second in the ALK gene called p.T656I (c.1967C>T).  At this time, it is unknown if these variants are associated with increased cancer risk or if they are normal findings, but most variants such as these get reclassified to being inconsequential. They should not be used  to make medical management decisions or identify at risk family members. With time, we suspect the lab will determine the significance of these variants, if any. If we do learn more about them, we will try to contact [name] to discuss it further. However, it is important to stay in touch with [place] periodically and keep the address and phone number up to date.  ADDITIONAL GENETIC TESTING: We discussed with [name] family that her genetic testing was fairly extensive.  If there are genes identified to increase cancer risk that can be analyzed in the future, we would be happy to discuss and coordinate this testing at that time.    CANCER SCREENING RECOMMENDATIONS: [name] test result is considered negative (normal).  This means that we have not identified a hereditary cause for her personal and family history of cancer at this time. Most cancers happen by chance and this negative test suggests that her personal and family history of cancer may fall into this category.    Possible reasons for [name]'s negative genetic test include:  1. There may be a gene mutation in one of these genes that current testing methods cannot detect but that chance is small.  2. There could be another gene that has not yet been discovered, or that we have not yet tested, that is responsible for the cancer diagnoses in the family.  3.  There may be no hereditary risk for cancer in the family. The  cancers in [name] and/or her family may be sporadic/familial or due to other genetic and environmental factors. 4. It is also possible there is a hereditary cause for the cancer in the family that [name] did not inherit.  Therefore, it is recommended she continue to follow the cancer management and screening guidelines provided by her oncology and primary healthcare provider. An individual's cancer risk and medical management are not determined by genetic test results alone. Overall cancer risk assessment  incorporates additional factors, including personal medical history, family history, and any available genetic information that may result in a personalized plan for cancer prevention and surveillance  Given [name]'s personal and family histories, we must interpret these negative results with some caution.  Families with features suggestive of hereditary risk for cancer tend to have multiple family members with cancer, diagnoses in multiple generations and diagnoses before the age of 32. [name] family exhibits some of these features. Thus, this result may simply reflect our current inability to detect all mutations within these genes or there may be a different gene that has not yet been discovered or tested.   An individual's cancer risk and medical management are not determined by genetic test results alone. Overall cancer risk assessment incorporates additional factors, including personal medical history, family history, and any available genetic information that may result in a personalized plan for cancer prevention and surveillance.  RECOMMENDATIONS FOR FAMILY MEMBERS:  Individuals in this family might be at some increased risk of developing cancer, over the general population risk, simply due to the family history of cancer.  We recommended women in this family have a yearly mammogram beginning at age 69, or 31 years younger than the earliest onset of cancer, an annual clinical breast exam, and perform monthly breast self-exams. Women in this family should also have a gynecological exam as recommended by their primary provider. All family members should be referred for colonoscopy starting at age 77, or 14 years younger than the earliest onset of cancer.  Family members with a personal history of cancer may also consider completing their own germline testing for hereditary cancer. Instructions provided to relatives about accessing a genetic counselor through the [facility] system or how to  identify a genetic counselor for out of town relatives.   FOLLOW-UP: Lastly, we discussed with [name]. Hickle family that cancer genetics is a rapidly advancing field and it is possible that new genetic tests will be appropriate for her and/or her family members in the future. We encouraged her to remain in contact with cancer genetics on an annual basis so we can update her personal and family histories and let her know of advances in cancer genetics that may benefit this family.   Our contact number was provided. [name]. Alemu daughter's questions were answered to her satisfaction, and she knows she is welcome to call us at anytime with additional questions or concerns.   Vassie Moment, [name], [facility] Licensed, Retail [email or web address] [phone]

## 2023-03-10 NOTE — Telephone Encounter (Signed)
I spoke to Ms. Lewan's daughter, Casimer Bilis (per pt request), to review results of genetic testing. Ms. Lahue had genetic testing with [facility] CancerNext-Expanded +RNAinsight panel. 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 We discussed that we do not use VUS to make management decisions or identify at risk family members. Discussed that we do not know why she has cancer or why there is cancer in the family. It could be due to a different gene that we are not testing, or maybe our current technology may not be able to pick something up.  It will be important for her to keep in contact with genetics to keep up with whether additional testing may be needed. We will contact her if new information is learned about the VUS.   Encouraged family members diagnosed with cancer to still consider their own genetic testing.   Please see counseling note for further detail on this result.

## 2023-03-10 NOTE — Telephone Encounter (Signed)
Daughter Casimer Bilis) stated patient's EKG results need to be faxed to patient's surgeon's office (Dr. [name]) at fax# [phone],  Attn: Holly/Triage.

## 2023-03-15 ENCOUNTER — Encounter: Payer: Self-pay | Admitting: *Deleted

## 2023-03-16 ENCOUNTER — Telehealth: Payer: Self-pay | Admitting: Hematology and Oncology

## 2023-03-16 ENCOUNTER — Encounter: Payer: Self-pay | Admitting: Licensed Clinical Social Worker

## 2023-03-16 NOTE — Telephone Encounter (Signed)
Patient's daughter is aware pf rescheduled appointment, un bale to leave message on patient's contact due to voicemail box being full

## 2023-03-16 NOTE — Progress Notes (Signed)
[facility] CSW Progress Note  Visual merchandiser received TC from pt asking about status of [name] application. CSW checked portal and notified pt that application has been updated to "payment in progress" and that she should receive it within the next 2 weeks. No other questions or needs at this time.      Hareem Surowiec E Lilliah Priego, LCSW Clinical Social Worker [employer]

## 2023-03-18 ENCOUNTER — Inpatient Hospital Stay: Payer: 59 | Attending: Hematology and Oncology | Admitting: Hematology and Oncology

## 2023-03-19 ENCOUNTER — Encounter: Payer: Self-pay | Admitting: *Deleted

## 2023-03-19 DIAGNOSIS — C50912 Malignant neoplasm of unspecified site of left female breast: Secondary | ICD-10-CM | POA: Diagnosis not present

## 2023-03-19 DIAGNOSIS — M3213 Lung involvement in systemic lupus erythematosus: Secondary | ICD-10-CM | POA: Diagnosis not present

## 2023-03-19 DIAGNOSIS — R058 Other specified cough: Secondary | ICD-10-CM | POA: Diagnosis not present

## 2023-03-19 DIAGNOSIS — C50512 Malignant neoplasm of lower-outer quadrant of left female breast: Secondary | ICD-10-CM | POA: Diagnosis not present

## 2023-03-19 DIAGNOSIS — Z170 Estrogen receptor positive status [ER+]: Secondary | ICD-10-CM | POA: Diagnosis not present

## 2023-03-19 DIAGNOSIS — J84112 Idiopathic pulmonary fibrosis: Secondary | ICD-10-CM | POA: Diagnosis not present

## 2023-03-19 DIAGNOSIS — R0602 Shortness of breath: Secondary | ICD-10-CM | POA: Diagnosis not present

## 2023-03-24 NOTE — Progress Notes (Signed)
 Patient's chart was reviewed by DR Bradley Ferris and because of her extensive lung disease and home O2 use, she will need to be done at [facility]. Tonya at Dr Eliberto Ivory office notified.

## 2023-03-24 NOTE — Anesthesia Preprocedure Evaluation (Addendum)
 Anesthesia Evaluation  Patient identified by MRN, date of birth, ID band Patient awake    Reviewed: Allergy & Precautions, NPO status , Patient's Chart, lab work & pertinent test results  Airway Mallampati: I  TM Distance: >3 FB Neck ROM: Full    Dental no notable dental hx. (+) Edentulous Upper, Edentulous Lower   Pulmonary shortness of breath and Long-Term Oxygen Therapy ILD (interstitial lung disease)   Pulmonary exam normal breath sounds clear to auscultation       Cardiovascular hypertension, (-) angina +CHF  (-) Past MI Normal cardiovascular exam+ dysrhythmias Atrial Fibrillation  Rhythm:Regular Rate:Normal  09/2022 ECHO: 1. Left ventricular ejection fraction, by estimation, is 60 to 65%. The  left ventricle has normal function. The left ventricle has no regional  wall motion abnormalities. There is mild left ventricular hypertrophy.  Left ventricular diastolic parameters  are consistent with Grade II diastolic dysfunction (pseudonormalization).  Elevated left atrial pressure.   2. Right ventricular systolic function is normal. The right ventricular  size is normal. There is mildly elevated pulmonary artery systolic  pressure.   3. Left atrial size was mildly dilated.   4. Right atrial size was mildly dilated.   5. The mitral valve is normal in structure. Mild mitral valve  regurgitation. No evidence of mitral stenosis.   6. The aortic valve is tricuspid. Aortic valve regurgitation is trivial.  Aortic valve sclerosis is present, with no evidence of aortic valve  stenosis.   7. The inferior vena cava is normal in size with greater than 50%  respiratory variability, suggesting right atrial pressure of 3 mmHg.     Neuro/Psych negative neurological ROS     GI/Hepatic negative GI ROS, Neg liver ROS,,,  Endo/Other  diabetesHypothyroidism    Renal/GU Renal diseaseLab Results      Component                Value                Date                           K                        4.2                 03/26/2023                CO2                      24                  03/26/2023                    CREATININE               1.18 (H)            03/26/2023                GFRNONAA                 47 (L)              03/26/2023                CALCIUM                  9.5  03/26/2023                ALBUMIN                  3.9                 10/29/2022                GLUCOSE                  136 (H)             03/26/2023                Musculoskeletal  (+) Arthritis ,    Abdominal   Peds  Hematology   Anesthesia Other Findings LEFT BREAST CANCER  All: Cipro, lisinopril, crestor  Reproductive/Obstetrics                             Anesthesia Physical Anesthesia Plan  ASA: 3  Anesthesia Plan: General   Post-op Pain Management: Regional block* and Minimal or no pain anticipated   Induction: Intravenous  PONV Risk Score and Plan: Propofol infusion, TIVA, Treatment may vary due to age or medical condition and Ondansetron  Airway Management Planned: LMA  Additional Equipment: None  Intra-op Plan:   Post-operative Plan:   Informed Consent: I have reviewed the patients History and Physical, chart, labs and discussed the procedure including the risks, benefits and alternatives for the proposed anesthesia with the patient or authorized representative who has indicated his/her understanding and acceptance.     Dental advisory given  Plan Discussed with: CRNA and Surgeon  Anesthesia Plan Comments: (LMA TIVA w L pec block)       Anesthesia Quick Evaluation

## 2023-03-25 NOTE — Progress Notes (Signed)
 Surgical Instructions   Your procedure is scheduled on Wednesday, March 5th, 2025. Report to [facility] Main Entrance "A" at 11:30 A.M., then check in with the Admitting office. Any questions or running late day of surgery: call [phone]  Questions prior to your surgery date: call [phone], Monday-Friday, 8am-4pm. If you experience any cold or flu symptoms such as cough, fever, chills, shortness of breath, etc. between now and your scheduled surgery, please notify us at the above number.     Remember:  Do not eat after midnight the night before your surgery   You may drink clear liquids until 10:30 the morning of your surgery.   Clear liquids allowed are: Water, Non-Citrus Juices (without pulp), Carbonated Beverages, Clear Tea (no milk, honey, etc.), Black Coffee Only (NO MILK, CREAM OR POWDERED CREAMER of any kind), and Gatorade.    Take these medicines the morning of surgery with A SIP OF WATER: Dilitiazem (Cardizem) Gabapentin (Neurontin) Levothyroxine (Synthroid) Omeprazole (Prilosec)   May take these medicines IF NEEDED: None.   Per your cardiologist's instructions, Eliquis should be held for 2 days prior to your surgery.  Your last dose should be on Sunday, March 2nd.     One week prior to surgery, STOP taking any Aspirin (unless otherwise instructed by your surgeon) Aleve, Naproxen, Ibuprofen, Motrin, Advil, Goody's, BC's, all herbal medications, fish oil, and non-prescription vitamins.   WHAT DO I DO ABOUT MY DIABETES MEDICATION?   Do not take oral diabetes medicines (pills) the morning of surgery.  Do NOT take Glimepiride the morning of surgery.     HOW TO MANAGE YOUR DIABETES BEFORE AND AFTER SURGERY  Why is it important to control my blood sugar before and after surgery? Improving blood sugar levels before and after surgery helps healing and can limit problems. A way of improving blood sugar control is eating a healthy diet by:  Eating less sugar and  carbohydrates  Increasing activity/exercise  Talking with your doctor about reaching your blood sugar goals High blood sugars (greater than 180 mg/dL) can raise your risk of infections and slow your recovery, so you will need to focus on controlling your diabetes during the weeks before surgery. Make sure that the doctor who takes care of your diabetes knows about your planned surgery including the date and location.  How do I manage my blood sugar before surgery? Check your blood sugar at least 4 times a day, starting 2 days before surgery, to make sure that the level is not too high or low.  Check your blood sugar the morning of your surgery when you wake up and every 2 hours until you get to the Short Stay unit.  If your blood sugar is less than 70 mg/dL, you will need to treat for low blood sugar: Do not take insulin. Treat a low blood sugar (less than 70 mg/dL) with  cup of clear juice (cranberry or apple), 4 glucose tablets, OR glucose gel. Recheck blood sugar in 15 minutes after treatment (to make sure it is greater than 70 mg/dL). If your blood sugar is not greater than 70 mg/dL on recheck, call [phone] for further instructions. Report your blood sugar to the short stay nurse when you get to Short Stay.  If you are admitted to the hospital after surgery: Your blood sugar will be checked by the staff and you will probably be given insulin after surgery (instead of oral diabetes medicines) to make sure you have good blood sugar levels. The goal  for blood sugar control after surgery is 80-180 mg/dL.                      Do NOT Smoke (Tobacco/Vaping) for 24 hours prior to your procedure.  If you use a CPAP at night, you may bring your mask/headgear for your overnight stay.   You will be asked to remove any contacts, glasses, piercing's, hearing aid's, dentures/partials prior to surgery. Please bring cases for these items if needed.    Patients discharged the day of surgery will  not be allowed to drive home, and someone needs to stay with them for 24 hours.  SURGICAL WAITING ROOM VISITATION Patients may have no more than 2 support people in the waiting area - these visitors may rotate.   Pre-op nurse will coordinate an appropriate time for 1 ADULT support person, who may not rotate, to accompany patient in pre-op.  Children under the age of 29 must have an adult with them who is not the patient and must remain in the main waiting area with an adult.  If the patient needs to stay at the hospital during part of their recovery, the visitor guidelines for inpatient rooms apply.  Please refer to the [facility] website for the visitor guidelines for any additional information.   If you received a COVID test during your pre-op visit  it is requested that you wear a mask when out in public, stay away from anyone that may not be feeling well and notify your surgeon if you develop symptoms. If you have been in contact with anyone that has tested positive in the last 10 days please notify you surgeon.      Pre-operative CHG Bathing Instructions   You can play a key role in reducing the risk of infection after surgery. Your skin needs to be as free of germs as possible. You can reduce the number of germs on your skin by washing with CHG (chlorhexidine gluconate) soap before surgery. CHG is an antiseptic soap that kills germs and continues to kill germs even after washing.   DO NOT use if you have an allergy to chlorhexidine/CHG or antibacterial soaps. If your skin becomes reddened or irritated, stop using the CHG and notify one of our RNs at [phone].              TAKE A SHOWER THE NIGHT BEFORE SURGERY AND THE DAY OF SURGERY    Please keep in mind the following:  DO NOT shave, including legs and underarms, 48 hours prior to surgery.   You may shave your face before/day of surgery.  Place clean sheets on your bed the night before surgery Use a clean washcloth (not used  since being washed) for each shower. DO NOT sleep with pet's night before surgery.  CHG Shower Instructions:  Wash your face and private area with normal soap. If you choose to wash your hair, wash first with your normal shampoo.  After you use shampoo/soap, rinse your hair and body thoroughly to remove shampoo/soap residue.  Turn the water OFF and apply half the bottle of CHG soap to a CLEAN washcloth.  Apply CHG soap ONLY FROM YOUR NECK DOWN TO YOUR TOES (washing for 3-5 minutes)  DO NOT use CHG soap on face, private areas, open wounds, or sores.  Pay special attention to the area where your surgery is being performed.  If you are having back surgery, having someone wash your back for you may be  helpful. Wait 2 minutes after CHG soap is applied, then you may rinse off the CHG soap.  Pat dry with a clean towel  Put on clean pajamas    Additional instructions for the day of surgery: DO NOT APPLY any lotions, deodorants, cologne, or perfumes.   Do not wear jewelry or makeup Do not wear nail polish, gel polish, artificial nails, or any other type of covering on natural nails (fingers and toes) Do not bring valuables to the hospital. [facility] is not responsible for valuables/personal belongings. Put on clean/comfortable clothes.  Please brush your teeth.  Ask your nurse before applying any prescription medications to the skin.

## 2023-03-26 ENCOUNTER — Encounter (HOSPITAL_COMMUNITY)
Admission: RE | Admit: 2023-03-26 | Discharge: 2023-03-26 | Disposition: A | Discharge status: Home / Self Care | Payer: 59 | Source: Ambulatory Visit | Attending: Surgery | Admitting: Surgery

## 2023-03-26 ENCOUNTER — Encounter (HOSPITAL_COMMUNITY): Payer: Self-pay

## 2023-03-26 ENCOUNTER — Other Ambulatory Visit: Payer: Self-pay

## 2023-03-26 VITALS — BP 130/62 | HR 68 | Temp 98.0°F | Resp 18 | Ht 67.0 in | Wt 146.7 lb

## 2023-03-26 DIAGNOSIS — M329 Systemic lupus erythematosus, unspecified: Secondary | ICD-10-CM | POA: Diagnosis not present

## 2023-03-26 DIAGNOSIS — E119 Type 2 diabetes mellitus without complications: Secondary | ICD-10-CM | POA: Diagnosis not present

## 2023-03-26 DIAGNOSIS — Z01812 Encounter for preprocedural laboratory examination: Secondary | ICD-10-CM | POA: Insufficient documentation

## 2023-03-26 DIAGNOSIS — E785 Hyperlipidemia, unspecified: Secondary | ICD-10-CM | POA: Insufficient documentation

## 2023-03-26 DIAGNOSIS — I10 Essential (primary) hypertension: Secondary | ICD-10-CM | POA: Diagnosis not present

## 2023-03-26 DIAGNOSIS — K219 Gastro-esophageal reflux disease without esophagitis: Secondary | ICD-10-CM | POA: Diagnosis not present

## 2023-03-26 DIAGNOSIS — I480 Paroxysmal atrial fibrillation: Secondary | ICD-10-CM | POA: Insufficient documentation

## 2023-03-26 HISTORY — DX: Gastro-esophageal reflux disease without esophagitis: K21.9

## 2023-03-26 HISTORY — DX: Endocarditis in systemic lupus erythematosus: M32.11

## 2023-03-26 HISTORY — DX: Dyspnea, unspecified: R06.00

## 2023-03-26 HISTORY — DX: Unspecified osteoarthritis, unspecified site: M19.90

## 2023-03-26 HISTORY — DX: Unspecified atrial flutter: I48.92

## 2023-03-26 LAB — BASIC METABOLIC PANEL
Anion gap: 10 (ref 5–15)
BUN: 11 mg/dL (ref 8–23)
CO2: 24 mmol/L (ref 22–32)
Calcium: 9.5 mg/dL (ref 8.9–10.3)
Chloride: 102 mmol/L (ref 98–111)
Creatinine, Ser: 1.18 mg/dL — ABNORMAL HIGH (ref 0.44–1.00)
GFR, Estimated: 47 mL/min — ABNORMAL LOW (ref >60)
Glucose, Bld: 136 mg/dL — ABNORMAL HIGH (ref 70–99)
Potassium: 4.2 mmol/L (ref 3.5–5.1)
Sodium: 136 mmol/L (ref 135–145)

## 2023-03-26 LAB — GLUCOSE, CAPILLARY: Glucose-Capillary: 124 mg/dL — ABNORMAL HIGH (ref 70–99)

## 2023-03-26 LAB — HEMOGLOBIN A1C
Hgb A1c MFr Bld: 6.8 % — ABNORMAL HIGH (ref 4.8–5.6)
Mean Plasma Glucose: 148.46 mg/dL

## 2023-03-26 NOTE — Progress Notes (Addendum)
 PCP - Dr. Merri Brunette Cardiologist - Dr. Yates Decamp Pulmonologist- Dr. Kalman Shan  PPM/ICD - denies   Chest x-ray - 10/11/22 EKG - 03/01/23 Stress Test - 07/03/16 ECHO - 10/12/22 Cardiac Cath - denies  Sleep Study - denies   Fasting Blood Sugar - 90-100 Checks Blood Sugar once a week  Last dose of GLP1 agonist-  n/a   Blood Thinner Instructions: Hold Eliquis 2 days- last dose 3/2 Aspirin Instructions: n/a  ERAS Protcol - clears until 1030   COVID TEST- n/a   Anesthesia review: yes, cardiac and pulmonary hx. Pt states she only used home O2 during the months of Sept-Oct. She no longer uses it.   Patient denies shortness of breath, fever, cough and chest pain at PAT appointment   All instructions explained to the patient, with a verbal understanding of the material. Patient agrees to go over the instructions while at home for a better understanding.  The opportunity to ask questions was provided.

## 2023-03-29 NOTE — Progress Notes (Signed)
 Anesthesia Chart Review:  83 year old female follows with cardiology for history of HTN, HLD, paroxysmal a flutter, coronary calcium on CT, systemic lupus.  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  She was treated with anticoagulation. Xarelto was discontinued in 2022 through shared decision making due to echocardiogram revealing well-healed vegetation at the tip of the mitral valve.  Admitted Sept 2024 with progressive SOB, intermittent fevers, found with PNA and Aflutter. Had spontaneous CV to SR, started on OAC.  Last seen by Francis Dowse, PA-C 03/01/2023 for preop evaluation.  Per note, "Low RCRI score.  Reviewed with Dr. Jacinto Halim who knows her well.  No cardiac contraindication to mastectomy.  OK to old Eliquis 2 days pre-op to resume when safe to do so afterwards."  Follows with pulmonologist Dr. Marchelle Gearing for history of asthma, systemic lupus, ILD.  Last seen 03/08/2023.  Per note, PFTs have been stable since 2019, ONO in October 2024 showed no desaturations.    Other pertinent history includes GERD (on omeprazole), hypothyroid, non-insulin-dependent DM2.  Patient reports last dose Eliquis 03/28/2023.  Preop labs reviewed, creatinine mildly elevated 1.18, otherwise unremarkable.  A1c 6.8.  EKG 03/01/2023: NSR.  Rate 83.  Nonspecific ST-T changes.  PFTs 03/08/2023: FVC-%Pred-Pre % 62  FEV1-%Pred-Pre % 67  Pre FEV1/FVC ratio % 82  FEV1FVC-%Pred-Pre % 110  DLCO unc % pred % 44   TTE 10/12/2022: 1. Left ventricular ejection fraction, by estimation, is 60 to 65%. The  left ventricle has normal function. The left ventricle has no regional  wall motion abnormalities. There is mild left ventricular hypertrophy.  Left ventricular diastolic parameters  are consistent with Grade II diastolic dysfunction (pseudonormalization).  Elevated left atrial pressure.   2. Right ventricular  systolic function is normal. The right ventricular  size is normal. There is mildly elevated pulmonary artery systolic  pressure.   3. Left atrial size was mildly dilated.   4. Right atrial size was mildly dilated.   5. The mitral valve is normal in structure. Mild mitral valve  regurgitation. No evidence of mitral stenosis.   6. The aortic valve is tricuspid. Aortic valve regurgitation is trivial.  Aortic valve sclerosis is present, with no evidence of aortic valve  stenosis.   7. The inferior vena cava is normal in size with greater than 50%  respiratory variability, suggesting right atrial pressure of 3 mmHg.    Lexiscan myoview stress test 07/03/2016: 1. The resting electrocardiogram demonstrated normal sinus rhythm, normal resting conduction, PVC and normal rest repolarization. Stress EKG is non-diagnostic for ischemia as it a pharmacologic stress using Lexiscan. Stress symptoms included dyspnea. 2. Myocardial perfusion imaging is normal. Overall left ventricular systolic function was normal without regional wall motion abnormalities. The left ventricular ejection fraction was 75%.    Zannie Cove [facility] Short Stay Center/Anesthesiology Phone [phone] 03/29/2023 12:53 PM]

## 2023-03-30 NOTE — H&P (Signed)
 REFERRING PHYSICIAN: Sanda Klein PROVIDER: Wayne Both, MD MRN: [id number] DOB: [birth year]  Subjective   Chief Complaint: New Consultation (Breast Cancer)  History of Present Illness: [name] is a 83 y.o. female who is seen as an office consultation for evaluation of New Consultation (Breast Cancer)  This is a pleasant 83 year old female who is accompanied by her daughter and other family for evaluation of breast cancer. She had recently undergone a CT of her chest showed a pulmonary embolism when she was seen to have a calcified mass in the left breast. She had a previous history of left breast cancer treated in [place] in 2009 with a lumpectomy and radiation therapy. She did not receive chemotherapy or antihormonal therapy. She has had no problems regarding her breast other than a deformity and hard mass that developed after radiation. After the CT scan she underwent mammograms and an ultrasound of the breast. At the 10 o'clock position of the left breast 2 cm from nipple there was an 11 mm mass with ill-defined margins. It was felt to be partly cystic. This was slightly above the large lumpectomy calcified bed from her previous surgery. 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 She is currently otherwise without complaints. She is fairly active.  Review of Systems: A complete review of systems was obtained from the patient. I have reviewed this information and discussed as appropriate with the patient. See HPI as well for other ROS.  ROS   Medical History: Past Medical History:  Diagnosis Date  Arthritis  Chronic kidney disease  Diabetes mellitus without complication (CMS/HHS-HCC)  GERD (gastroesophageal reflux disease)  History of cancer  Pulmonary embolism (CMS/HHS-HCC)  Thyroid disease   There is no problem list on file for this patient.  Past Surgical  History:  Procedure Laterality Date  MASTECTOMY PARTIAL / LUMPECTOMY    Allergies  Allergen Reactions  Ciprofloxacin Itching, Other (See Comments) and Swelling  Possibly causing tremors?  Lisinopril Cough  Rosuvastatin Other (See Comments)  Myalgia and back pain  Nintedanib Nausea   Current Outpatient Medications on File Prior to Visit  Medication Sig Dispense Refill  amLODIPine (NORVASC) 5 MG tablet Take 5 mg by mouth every evening  ELIQUIS 5 mg tablet Take 1 tablet by mouth 2 (two) times daily  gabapentin (NEURONTIN) 300 MG capsule Take 300 mg by mouth 3 (three) times daily  glimepiride (AMARYL) 1 MG tablet Take 1 mg by mouth  hydroxychloroquine (PLAQUENIL) 200 mg tablet TAKE 1 TABLET BY MOUTH ONCE DAILY WITH FOOD OR MILK for 30  levothyroxine (SYNTHROID) 100 MCG tablet Take 100 mcg by mouth once daily  omeprazole (PRILOSEC) 20 MG DR capsule Take 20 mg by mouth every morning  spironolactone (ALDACTONE) 25 MG tablet Take 12.5 mg by mouth once daily   No current facility-administered medications on file prior to visit.   Family History  Problem Relation Age of Onset  High blood pressure (Hypertension) Mother  Diabetes Mother    Social History   Tobacco Use  Smoking Status Never  Smokeless Tobacco Never    Social History   Socioeconomic History  Marital status: Unknown  Tobacco Use  Smoking status: Never  Smokeless tobacco: Never  Vaping Use  Vaping status: Never Used  Substance and Sexual Activity  Alcohol use: Not Currently  Drug use: Never   Social Drivers of Health   Food Insecurity: No  Food Insecurity (10/12/2022)  Received from [facility]  Hunger Vital Sign  Worried About Running Out of Food in the Last Year: Never true  Ran Out of Food in the Last Year: Never true  Transportation Needs: No Transportation Needs (10/12/2022)  Received from [facility] - Transportation  Lack of Transportation (Medical): No  Lack of Transportation  (Non-Medical): No   Objective:   Vitals:   BP: (!) 142/82  Temp: 36.7 C (98 F)  Weight: 69.3 kg (152 lb 12.8 oz)  Height: 170.2 cm (5\' 7" )  PainSc: 0-No pain   Body mass index is 23.93 kg/m.  Physical Exam   She appears well on exam  There is an old scar on her left breast as well as a hard mass with indentation of the breast at the 9 o'clock position. I cannot feel the second mass secondary to this large calcified area.  There is no axillary adenopathy.  Labs, Imaging and Diagnostic Testing: I have reviewed her notes in the electronic medical records. I reviewed the CT scan of her chest as well as the mammograms and ultrasound and pathology results  Assessment and Plan:   Diagnoses and all orders for this visit:  Invasive ductal carcinoma of breast, left (CMS/HHS-HCC) - Ambulatory Referral to Oncology-Medical - Ambulatory Referral to Cancer Genetics - [id number]   I discussed the results of the pathology with the patient and her family. From a surgical standpoint, we again discussed the options of breast conservation with a lumpectomy versus mastectomy given her recurrent disease and previous history of breast cancer status post radiation on the same side. We have also discussed her in our multidisciplinary breast cancer conference. Radiology has recommended an MRI of her breasts given the recurrence as well as to evaluate the extent of disease. They are interested in MRI as well before they make their final decision on surgery. They also may consider a holistic approach. They agree with the referral to the cancer center for medical oncology's opinion as well as genetics. I will order the MRI of her breast and I will call them back with the results as well. We will then determine the best approach for her regarding surgery. They understand and agree with the plans.  Addendum: The MRI showed the cancer measures 1.1 x 1.1 cm.  Non mass enhancement was seen around the area and  radiology with biopsy by MRI recommended.  The patient saw medical oncology after further discussion the wish to proceed with a left mastectomy. We have discussed the risks which includes but is not limited to bleeding, infection, drain placement at surgery, injury to surrounding structures, cardiopulmonary issues given her medical history, DVT, etc.  She and her family agree to proceed with surgery.

## 2023-03-31 ENCOUNTER — Encounter (HOSPITAL_COMMUNITY): Payer: Self-pay | Admitting: Surgery

## 2023-03-31 ENCOUNTER — Ambulatory Visit (HOSPITAL_BASED_OUTPATIENT_CLINIC_OR_DEPARTMENT_OTHER): Payer: Self-pay | Admitting: Anesthesiology

## 2023-03-31 ENCOUNTER — Other Ambulatory Visit: Payer: Self-pay

## 2023-03-31 ENCOUNTER — Ambulatory Visit (HOSPITAL_COMMUNITY): Payer: Self-pay | Admitting: Anesthesiology

## 2023-03-31 ENCOUNTER — Observation Stay (HOSPITAL_COMMUNITY)
Admission: RE | Admit: 2023-03-31 | Discharge: 2023-04-01 | Disposition: A | Discharge status: Home / Self Care | Payer: 59 | Attending: Surgery | Admitting: Surgery

## 2023-03-31 ENCOUNTER — Encounter (HOSPITAL_COMMUNITY)
Admission: RE | Disposition: A | Discharge status: Home / Self Care | Payer: Self-pay | Source: Home / Self Care | Attending: Surgery

## 2023-03-31 DIAGNOSIS — E782 Mixed hyperlipidemia: Secondary | ICD-10-CM

## 2023-03-31 DIAGNOSIS — I5032 Chronic diastolic (congestive) heart failure: Secondary | ICD-10-CM | POA: Diagnosis not present

## 2023-03-31 DIAGNOSIS — Z86711 Personal history of pulmonary embolism: Secondary | ICD-10-CM | POA: Diagnosis not present

## 2023-03-31 DIAGNOSIS — I110 Hypertensive heart disease with heart failure: Secondary | ICD-10-CM | POA: Diagnosis not present

## 2023-03-31 DIAGNOSIS — D0512 Intraductal carcinoma in situ of left breast: Principal | ICD-10-CM | POA: Insufficient documentation

## 2023-03-31 DIAGNOSIS — E119 Type 2 diabetes mellitus without complications: Secondary | ICD-10-CM

## 2023-03-31 DIAGNOSIS — E1122 Type 2 diabetes mellitus with diabetic chronic kidney disease: Secondary | ICD-10-CM | POA: Diagnosis not present

## 2023-03-31 DIAGNOSIS — I129 Hypertensive chronic kidney disease with stage 1 through stage 4 chronic kidney disease, or unspecified chronic kidney disease: Secondary | ICD-10-CM | POA: Diagnosis not present

## 2023-03-31 DIAGNOSIS — Z7901 Long term (current) use of anticoagulants: Secondary | ICD-10-CM | POA: Diagnosis not present

## 2023-03-31 DIAGNOSIS — C50912 Malignant neoplasm of unspecified site of left female breast: Secondary | ICD-10-CM

## 2023-03-31 DIAGNOSIS — N189 Chronic kidney disease, unspecified: Secondary | ICD-10-CM | POA: Diagnosis not present

## 2023-03-31 DIAGNOSIS — Z79899 Other long term (current) drug therapy: Secondary | ICD-10-CM | POA: Diagnosis not present

## 2023-03-31 DIAGNOSIS — Z9012 Acquired absence of left breast and nipple: Principal | ICD-10-CM

## 2023-03-31 HISTORY — PX: SIMPLE MASTECTOMY WITH AXILLARY SENTINEL NODE BIOPSY: SHX6098

## 2023-03-31 LAB — GLUCOSE, CAPILLARY
Glucose-Capillary: 71 mg/dL (ref 70–99)
Glucose-Capillary: 67 mg/dL — ABNORMAL LOW (ref 70–99)
Glucose-Capillary: 94 mg/dL (ref 70–99)
Glucose-Capillary: 68 mg/dL — ABNORMAL LOW (ref 70–99)
Glucose-Capillary: 84 mg/dL (ref 70–99)
Glucose-Capillary: 109 mg/dL — ABNORMAL HIGH (ref 70–99)

## 2023-03-31 SURGERY — SIMPLE MASTECTOMY
Anesthesia: General | Site: Breast | Laterality: Left

## 2023-03-31 MED ORDER — PROPOFOL 10 MG/ML IV BOLUS
INTRAVENOUS | Status: DC | PRN
  Administered 2023-03-31: 100 mg via INTRAVENOUS

## 2023-03-31 MED ORDER — GABAPENTIN 300 MG PO CAPS
300.0000 mg | ORAL_CAPSULE | Freq: Two times a day (BID) | ORAL | Status: DC
  Administered 2023-03-31: 300 mg via ORAL
  Filled 2023-03-31: qty 1

## 2023-03-31 MED ORDER — ORAL CARE MOUTH RINSE: 15.0000 mL | Freq: Once | OROMUCOSAL | Status: AC

## 2023-03-31 MED ORDER — ROCURONIUM BROMIDE 10 MG/ML (PF) SYRINGE
PREFILLED_SYRINGE | INTRAVENOUS | Status: AC
  Filled 2023-03-31: qty 10

## 2023-03-31 MED ORDER — ACETAMINOPHEN 500 MG PO TABS
ORAL_TABLET | ORAL | Status: AC
  Administered 2023-03-31: 1000 mg via ORAL
  Filled 2023-03-31: qty 2

## 2023-03-31 MED ORDER — LIDOCAINE 2% (20 MG/ML) 5 ML SYRINGE
INTRAMUSCULAR | Status: AC
  Filled 2023-03-31: qty 5

## 2023-03-31 MED ORDER — DEXMEDETOMIDINE HCL IN NACL 80 MCG/20ML IV SOLN
INTRAVENOUS | Status: DC | PRN
Start: 2023-03-31 — End: 2023-03-31
  Administered 2023-03-31: 4 ug via INTRAVENOUS

## 2023-03-31 MED ORDER — INSULIN ASPART 100 UNIT/ML IJ SOLN: 0.0000 [IU] | Freq: Three times a day (TID) | INTRAMUSCULAR | Status: DC

## 2023-03-31 MED ORDER — FENTANYL CITRATE (PF) 100 MCG/2ML IJ SOLN
25.0000 ug | INTRAMUSCULAR | Status: DC | PRN
  Administered 2023-03-31: 25 ug via INTRAVENOUS

## 2023-03-31 MED ORDER — ONDANSETRON HCL 4 MG/2ML IJ SOLN: 4.0000 mg | Freq: Four times a day (QID) | INTRAMUSCULAR | Status: DC | PRN

## 2023-03-31 MED ORDER — TRANEXAMIC ACID 1000 MG/10ML IV SOLN
2000.0000 mg | INTRAVENOUS | Status: DC
  Filled 2023-03-31: qty 20

## 2023-03-31 MED ORDER — ACETAMINOPHEN 500 MG PO TABS
1000.0000 mg | ORAL_TABLET | Freq: Four times a day (QID) | ORAL | Status: DC
  Administered 2023-03-31 – 2023-04-01 (×3): 1000 mg via ORAL
  Filled 2023-03-31 (×3): qty 2

## 2023-03-31 MED ORDER — LEVOTHYROXINE SODIUM 100 MCG PO TABS
100.0000 ug | ORAL_TABLET | Freq: Every day | ORAL | Status: DC
  Administered 2023-04-01: 100 ug via ORAL
  Filled 2023-03-31: qty 1

## 2023-03-31 MED ORDER — EPHEDRINE 5 MG/ML INJ
INTRAVENOUS | Status: AC
  Filled 2023-03-31: qty 5

## 2023-03-31 MED ORDER — SPIRONOLACTONE 12.5 MG HALF TABLET
12.5000 mg | ORAL_TABLET | Freq: Every day | ORAL | Status: DC
  Administered 2023-03-31: 12.5 mg via ORAL
  Filled 2023-03-31 (×2): qty 1

## 2023-03-31 MED ORDER — FENTANYL CITRATE (PF) 100 MCG/2ML IJ SOLN
INTRAMUSCULAR | Status: AC
  Filled 2023-03-31: qty 2

## 2023-03-31 MED ORDER — PROPOFOL 10 MG/ML IV BOLUS
INTRAVENOUS | Status: AC
  Filled 2023-03-31: qty 20

## 2023-03-31 MED ORDER — ONDANSETRON HCL 4 MG/2ML IJ SOLN
INTRAMUSCULAR | Status: AC
  Filled 2023-03-31: qty 2

## 2023-03-31 MED ORDER — OXYCODONE HCL 5 MG PO TABS: 5.0000 mg | ORAL_TABLET | Freq: Four times a day (QID) | ORAL | Status: DC | PRN

## 2023-03-31 MED ORDER — CEFAZOLIN SODIUM-DEXTROSE 2-4 GM/100ML-% IV SOLN
2.0000 g | INTRAVENOUS | Status: AC
Start: 2023-03-31 — End: 2023-03-31
  Administered 2023-03-31: 2 g via INTRAVENOUS

## 2023-03-31 MED ORDER — ACETAMINOPHEN 500 MG PO TABS: 1000.0000 mg | ORAL_TABLET | ORAL | Status: AC

## 2023-03-31 MED ORDER — LIDOCAINE 2% (20 MG/ML) 5 ML SYRINGE
INTRAMUSCULAR | Status: DC | PRN
  Administered 2023-03-31: 80 mg via INTRAVENOUS

## 2023-03-31 MED ORDER — ACETAMINOPHEN 10 MG/ML IV SOLN: 1000.0000 mg | Freq: Once | INTRAVENOUS | Status: DC | PRN

## 2023-03-31 MED ORDER — DILTIAZEM HCL ER COATED BEADS 120 MG PO CP24
120.0000 mg | ORAL_CAPSULE | Freq: Every day | ORAL | Status: DC
  Filled 2023-03-31: qty 1

## 2023-03-31 MED ORDER — GLIMEPIRIDE 1 MG PO TABS
1.0000 mg | ORAL_TABLET | Freq: Every day | ORAL | Status: DC
  Administered 2023-04-01: 1 mg via ORAL
  Filled 2023-03-31: qty 1

## 2023-03-31 MED ORDER — ENOXAPARIN SODIUM 40 MG/0.4ML IJ SOSY: 40.0000 mg | PREFILLED_SYRINGE | INTRAMUSCULAR | Status: DC

## 2023-03-31 MED ORDER — CHLORHEXIDINE GLUCONATE CLOTH 2 % EX PADS: 6.0000 | MEDICATED_PAD | Freq: Once | CUTANEOUS | Status: DC

## 2023-03-31 MED ORDER — PANTOPRAZOLE SODIUM 40 MG PO TBEC
40.0000 mg | DELAYED_RELEASE_TABLET | Freq: Every day | ORAL | Status: DC
  Administered 2023-03-31: 40 mg via ORAL
  Filled 2023-03-31: qty 1

## 2023-03-31 MED ORDER — FENTANYL CITRATE (PF) 250 MCG/5ML IJ SOLN
INTRAMUSCULAR | Status: DC | PRN
  Administered 2023-03-31: 25 ug via INTRAVENOUS

## 2023-03-31 MED ORDER — ROPIVACAINE HCL 5 MG/ML IJ SOLN
INTRAMUSCULAR | Status: DC | PRN
  Administered 2023-03-31: 30 mL

## 2023-03-31 MED ORDER — TRAMADOL HCL 50 MG PO TABS
50.0000 mg | ORAL_TABLET | Freq: Four times a day (QID) | ORAL | Status: DC | PRN
  Filled 2023-03-31: qty 1

## 2023-03-31 MED ORDER — CHLORHEXIDINE GLUCONATE 0.12 % MT SOLN
OROMUCOSAL | Status: AC
Start: 2023-03-31 — End: 2023-03-31
  Administered 2023-03-31: 15 mL via OROMUCOSAL
  Filled 2023-03-31: qty 15

## 2023-03-31 MED ORDER — ONDANSETRON HCL 4 MG/2ML IJ SOLN: 4.0000 mg | Freq: Once | INTRAMUSCULAR | Status: DC | PRN

## 2023-03-31 MED ORDER — FENTANYL CITRATE (PF) 100 MCG/2ML IJ SOLN
INTRAMUSCULAR | Status: AC
  Administered 2023-03-31: 25 ug via INTRAVENOUS
  Filled 2023-03-31: qty 2

## 2023-03-31 MED ORDER — CLONIDINE HCL (ANALGESIA) 100 MCG/ML EP SOLN
EPIDURAL | Status: DC | PRN
  Administered 2023-03-31: 100 ug

## 2023-03-31 MED ORDER — SODIUM CHLORIDE 0.9 % IV SOLN: INTRAVENOUS | Status: DC

## 2023-03-31 MED ORDER — CEFAZOLIN SODIUM-DEXTROSE 2-4 GM/100ML-% IV SOLN
INTRAVENOUS | Status: AC
  Filled 2023-03-31: qty 100

## 2023-03-31 MED ORDER — FENTANYL CITRATE (PF) 250 MCG/5ML IJ SOLN
INTRAMUSCULAR | Status: AC
  Filled 2023-03-31: qty 5

## 2023-03-31 MED ORDER — LACTATED RINGERS IV SOLN: INTRAVENOUS | Status: DC

## 2023-03-31 MED ORDER — INSULIN ASPART 100 UNIT/ML IJ SOLN: 0.0000 [IU] | Freq: Every day | INTRAMUSCULAR | Status: DC

## 2023-03-31 MED ORDER — EPHEDRINE SULFATE-NACL 50-0.9 MG/10ML-% IV SOSY
PREFILLED_SYRINGE | INTRAVENOUS | Status: DC | PRN
  Administered 2023-03-31: 5 mg via INTRAVENOUS

## 2023-03-31 MED ORDER — FENTANYL CITRATE (PF) 100 MCG/2ML IJ SOLN: 25.0000 ug | Freq: Once | INTRAMUSCULAR | Status: AC

## 2023-03-31 MED ORDER — PROPOFOL 500 MG/50ML IV EMUL
INTRAVENOUS | Status: DC | PRN
  Administered 2023-03-31: 70 ug/kg/min via INTRAVENOUS

## 2023-03-31 MED ORDER — ONDANSETRON 4 MG PO TBDP: 4.0000 mg | ORAL_TABLET | Freq: Four times a day (QID) | ORAL | Status: DC | PRN

## 2023-03-31 MED ORDER — 0.9 % SODIUM CHLORIDE (POUR BTL) OPTIME
TOPICAL | Status: DC | PRN
  Administered 2023-03-31: 1000 mL

## 2023-03-31 MED ORDER — MORPHINE SULFATE (PF) 2 MG/ML IV SOLN
1.0000 mg | INTRAVENOUS | Status: DC | PRN
  Filled 2023-03-31: qty 1

## 2023-03-31 MED ORDER — CHLORHEXIDINE GLUCONATE 0.12 % MT SOLN: 15.0000 mL | Freq: Once | OROMUCOSAL | Status: AC

## 2023-03-31 MED ORDER — ONDANSETRON HCL 4 MG/2ML IJ SOLN
INTRAMUSCULAR | Status: DC | PRN
  Administered 2023-03-31: 4 mg via INTRAVENOUS

## 2023-03-31 SURGICAL SUPPLY — 27 items
APPLIER CLIP 9.375 MED OPEN (MISCELLANEOUS) ×1 IMPLANT
BINDER BREAST LRG (GAUZE/BANDAGES/DRESSINGS) IMPLANT
BIOPATCH RED 1 DISK 7.0 (GAUZE/BANDAGES/DRESSINGS) IMPLANT
CANISTER SUCT 3000ML PPV (MISCELLANEOUS) ×1 IMPLANT
CHLORAPREP W/TINT 26 (MISCELLANEOUS) ×1 IMPLANT
CLIP APPLIE 9.375 MED OPEN (MISCELLANEOUS) ×1 IMPLANT
COVER SURGICAL LIGHT HANDLE (MISCELLANEOUS) ×1 IMPLANT
DERMABOND ADVANCED .7 DNX12 (GAUZE/BANDAGES/DRESSINGS) ×1 IMPLANT
DRAIN CHANNEL 19F RND (DRAIN) ×1 IMPLANT
DRAPE CHEST BREAST 15X10 FENES (DRAPES) ×1 IMPLANT
DRSG TEGADERM 4X4.75 (GAUZE/BANDAGES/DRESSINGS) IMPLANT
ELECT REM PT RETURN 9FT ADLT (ELECTROSURGICAL) ×1 IMPLANT
ELECTRODE REM PT RTRN 9FT ADLT (ELECTROSURGICAL) ×1 IMPLANT
EVACUATOR SILICONE 100CC (DRAIN) ×1 IMPLANT
GAUZE PAD ABD 8X10 STRL (GAUZE/BANDAGES/DRESSINGS) IMPLANT
GLOVE SURG SIGNA 7.5 PF LTX (GLOVE) ×1 IMPLANT
GOWN STRL REUS W/ TWL LRG LVL3 (GOWN DISPOSABLE) ×1 IMPLANT
GOWN STRL REUS W/ TWL XL LVL3 (GOWN DISPOSABLE) ×1 IMPLANT
KIT BASIN OR (CUSTOM PROCEDURE TRAY) ×1 IMPLANT
KIT TURNOVER KIT B (KITS) ×1 IMPLANT
NS IRRIG 1000ML POUR BTL (IV SOLUTION) ×1 IMPLANT
PACK GENERAL/GYN (CUSTOM PROCEDURE TRAY) ×1 IMPLANT
PAD ARMBOARD 7.5X6 YLW CONV (MISCELLANEOUS) ×1 IMPLANT
SUT MNCRL AB 4-0 PS2 18 (SUTURE) ×1 IMPLANT
SUT SILK 2 0 SH (SUTURE) IMPLANT
SUT VIC AB 3-0 SH 18 (SUTURE) ×1 IMPLANT
TOWEL GREEN STERILE FF (TOWEL DISPOSABLE) ×1 IMPLANT

## 2023-03-31 NOTE — Plan of Care (Signed)

## 2023-03-31 NOTE — Transfer of Care (Signed)
 Immediate Anesthesia Transfer of Care Note  Patient: [name]  Procedure(s) Performed: LEFT MASTECTOMY (Left: Breast)  Patient Location: PACU  Anesthesia Type:General and Regional  Level of Consciousness: drowsy  Airway & Oxygen Therapy: Patient Spontanous Breathing and Patient connected to face mask oxygen  Post-op Assessment: Report given to RN and Post -op Vital signs reviewed and stable  Post vital signs: Reviewed and stable  Last Vitals:  Vitals Value Taken Time  BP 114/56 03/31/23 1445  Temp 35.8 C 03/31/23 1445  Pulse 68 03/31/23 1450  Resp 15 03/31/23 1450  SpO2 100 % 03/31/23 1450  Vitals shown include unfiled device data.  Last Pain:  Vitals:   03/31/23 1223  TempSrc:   PainSc: 0-No pain         Complications: No notable events documented.

## 2023-03-31 NOTE — Progress Notes (Signed)
 CBG 67. Pt is asymptomatic; Dr. Richardson Landry is aware. No new orders.

## 2023-03-31 NOTE — Interval H&P Note (Signed)
 History and Physical Interval Note:no change in H and P  03/31/2023 1:12 PM  [name]  has presented today for surgery, with the diagnosis of LEFT BREAST CANCER.  The various methods of treatment have been discussed with the patient and family. After consideration of risks, benefits and other options for treatment, the patient has consented to  Procedure(s) with comments: LEFT MASTECTOMY (Left) - LMA PEC BLOCK as a surgical intervention.  The patient's history has been reviewed, patient examined, no change in status, stable for surgery.  I have reviewed the patient's chart and labs.  Questions were answered to the patient's satisfaction.     [name]

## 2023-03-31 NOTE — Anesthesia Procedure Notes (Signed)
 Anesthesia Regional Block: Pectoralis block   Pre-Anesthetic Checklist: , timeout performed,  Correct Patient, Correct Site, Correct Laterality,  Correct Procedure, Correct Position, site marked,  Risks and benefits discussed,  Surgical consent,  Pre-op evaluation,  At surgeon's request and post-op pain management  Laterality: Left  Prep: chloraprep       Needles:  Injection technique: Single-shot  Needle Type: Echogenic Needle     Needle Length: 5cm  Needle Gauge: 21     Additional Needles:   Procedures:,,,, ultrasound used (permanent image in chart),,    Narrative:  Start time: 03/31/2023 11:54 AM End time: 03/31/2023 12:02 PM Injection made incrementally with aspirations every 5 mL.  Performed by: Personally  Anesthesiologist: Trevor Iha, MD  Additional Notes: Block assessed. Patient tolerated procedure well.

## 2023-03-31 NOTE — Anesthesia Procedure Notes (Signed)
 Procedure Name: LMA Insertion Date/Time: 03/31/2023 1:35 PM  Performed by: Thomasene Ripple, CRNAPre-anesthesia Checklist: Patient identified, Emergency Drugs available, Suction available and Patient being monitored Patient Re-evaluated:Patient Re-evaluated prior to induction Oxygen Delivery Method: Circle System Utilized Preoxygenation: Pre-oxygenation with 100% oxygen Induction Type: IV induction Ventilation: Mask ventilation without difficulty LMA: LMA inserted LMA Size: 4.0 Number of attempts: 1 Airway Equipment and Method: Bite block Placement Confirmation: positive ETCO2 Tube secured with: Tape Dental Injury: Teeth and Oropharynx as per pre-operative assessment

## 2023-03-31 NOTE — Op Note (Signed)
 ANGELISSA SUPAN 03/31/2023   Pre-op Diagnosis: LEFT BREAST CANCER     Post-op Diagnosis: same  Procedure(s): LEFT TOTAL MASTECTOMY  Surgeon(s): [name], MD Maczis, Hedda Slade, PA-C  Anesthesia: General  Staff:  Circulator: Tawny Hopping, RN Scrub Person: Colan Neptune Circulator Assistant: Ivin Booty, RN  Estimated Blood Loss: Minimal               Specimens: sent to path  Indications: This is an 83 year old female with a prior history of left breast cancer status post lumpectomy followed by radiation therapy in 2009.  Late last fall she was found to have a mass in her left breast while undergoing a CT scan of the chest for other reasons.  She also had a large calcified area from her previous breast surgery.  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  A follow-up MRI showed the mass was 1.1 cm but there were other areas that need to biopsying.  After discussion with the patient and the family they elected to proceed with a left total mastectomy without reconstruction  Procedure: The patient was brought to the operating room and identified the correct patient.  She was placed upon the operating table and general anesthesia was induced.  Her left breast and axilla were then prepped and draped in usual sterile fashion.  She had a large hard mass from her previous calcified area at the medial aspect of the breast between the sternum and the nipple areolar complex.  I performed a wide elliptical incision staying around this area including the nipple areolar complex working toward the lateral breast with a scalpel.  I then dissected circumferentially down to the breast tissue with electrocautery.  I then dissected out the superior skin flap with the cautery staying just underneath the skin and dermis working my way toward the upper chest from medial to lateral.  I then dissected out the inferior skin flap  staying again just underneath the skin and dermis with cautery dissecting down toward the inframammary ridge.  I then took the dissection around both skin flaps toward the lateral chest.  We then slowly started dissecting the breast tissue off of the chest wall dissecting medial to lateral.  We reached the area of dense calcifications with an old seroma which we were able to excise with the mastectomy.  We then took the dissection all the way toward the lateral edge of the pectoralis muscle and then completed the total mastectomy with electrocautery.  I marked the lateral margin with a suture marking purposes.  The mastectomy specimen was then sent to pathology.  We then irrigated the chest wall with normal saline.  Hemostasis appeared to be achieved.  I made a separate skin incision and we placed a 19 [place] Blake drain into the incision.  This was sutured in place with a 2.0 nylon suture.  We then closed the subcutaneous tissue with interrupted 3-0 Vicryl sutures and closed the skin with a running 4-0 Monocryl.  Dermabond was then applied.  The drain was then placed to bulb suction.  The patient tolerated the procedure well.  All the counts were correct at the end of the procedure.  The patient was then extubated in the operating room and taken in a stable condition to the recovery room.          [name]   Date: 03/31/2023  Time: 2:22 PM

## 2023-04-01 ENCOUNTER — Encounter (HOSPITAL_COMMUNITY): Payer: Self-pay | Admitting: Surgery

## 2023-04-01 DIAGNOSIS — D0512 Intraductal carcinoma in situ of left breast: Secondary | ICD-10-CM | POA: Diagnosis not present

## 2023-04-01 LAB — BASIC METABOLIC PANEL
Anion gap: 6 (ref 5–15)
BUN: 10 mg/dL (ref 8–23)
CO2: 23 mmol/L (ref 22–32)
Calcium: 8.5 mg/dL — ABNORMAL LOW (ref 8.9–10.3)
Chloride: 105 mmol/L (ref 98–111)
Creatinine, Ser: 1.05 mg/dL — ABNORMAL HIGH (ref 0.44–1.00)
GFR, Estimated: 54 mL/min — ABNORMAL LOW (ref >60)
Glucose, Bld: 96 mg/dL (ref 70–99)
Potassium: 4.1 mmol/L (ref 3.5–5.1)
Sodium: 134 mmol/L — ABNORMAL LOW (ref 135–145)

## 2023-04-01 LAB — CBC
HCT: 28.8 % — ABNORMAL LOW (ref 36.0–46.0)
Hemoglobin: 9.6 g/dL — ABNORMAL LOW (ref 12.0–15.0)
MCH: 29.5 pg (ref 26.0–34.0)
MCHC: 33.3 g/dL (ref 30.0–36.0)
MCV: 88.6 fL (ref 80.0–100.0)
Platelets: 195 10*3/uL (ref 150–400)
RBC: 3.25 MIL/uL — ABNORMAL LOW (ref 3.87–5.11)
RDW: 14.2 % (ref 11.5–15.5)
WBC: 5.5 10*3/uL (ref 4.0–10.5)
nRBC: 0 % (ref 0.0–0.2)

## 2023-04-01 LAB — GLUCOSE, CAPILLARY: Glucose-Capillary: 115 mg/dL — ABNORMAL HIGH (ref 70–99)

## 2023-04-01 MED ORDER — TRAMADOL HCL 50 MG PO TABS: 50.0000 mg | ORAL_TABLET | Freq: Four times a day (QID) | ORAL | 0 refills | Status: DC | PRN

## 2023-04-01 NOTE — Anesthesia Postprocedure Evaluation (Signed)
 Anesthesia Post Note  Patient: [name]  Procedure(s) Performed: LEFT MASTECTOMY (Left: Breast)     Patient location during evaluation: PACU Anesthesia Type: General and Regional Level of consciousness: awake and alert Pain management: pain level controlled Vital Signs Assessment: post-procedure vital signs reviewed and stable Respiratory status: spontaneous breathing, nonlabored ventilation, respiratory function stable and patient connected to nasal cannula oxygen Cardiovascular status: blood pressure returned to baseline and stable Postop Assessment: no apparent nausea or vomiting Anesthetic complications: no   No notable events documented.  Last Vitals:  Vitals:   04/01/23 0800 04/01/23 0930  BP: 122/65 122/65  Pulse: (!) 55 (!) 55  Resp: 16 17  Temp: 36.6 C 36.6 C  SpO2: 100% 100%    Last Pain:  Vitals:   04/01/23 0930  TempSrc: Oral  PainSc:                  [name] [name]

## 2023-04-01 NOTE — Discharge Summary (Signed)
 Physician Discharge Summary  Patient ID: [name] MRN: [id number] DOB/AGE: 05/07/1940 83 y.o.  Admit date: 03/31/2023 Discharge date: 04/01/2023  Admission Diagnoses:  Discharge Diagnoses:  Principal Problem:   S/P left mastectomy Left breast cancer  Discharged Condition: good  Hospital Course: uneventful post op recover.  Discharged home POD#1 with  drain  Consults: None  Significant Diagnostic Studies:   Treatments: surgery: left total mastectomy  Discharge Exam: Blood pressure 107/71, pulse (!) 54, temperature 97.8 F (36.6 C), temperature source Oral, resp. rate 16, height 5\' 7"  (1.702 m), weight 65.8 kg, SpO2 98%. General appearance: alert, cooperative, and no distress Resp: clear to auscultation bilaterally Incision/Wound: left mastectomy site clean without hematoma, drain in place, serosanuinous  Disposition: Discharge disposition: 01-Home or Self Care        Allergies as of 04/01/2023       Reactions   Ciprofloxacin Itching, Swelling, Other (See Comments)   Possibly causing tremors?   Lisinopril Cough   Crestor [rosuvastatin] Other (See Comments)   Myalgia and back pain   Ofev [nintedanib] Nausea Only        Medication List     TAKE these medications    Accu-Chek Guide w/Device Kit USE TO CHECK BLOOD SUGAR AS DIRECTED BY PHYSICIAN.   Accu-Chek Softclix Lancets lancets SMARTSIG:Topical   dextromethorphan-guaiFENesin 30-600 MG 12hr tablet Commonly known as: MUCINEX DM Take 1 tablet by mouth 2 (two) times daily. What changed:  when to take this reasons to take this   diclofenac Sodium 1 % Gel Commonly known as: VOLTAREN Apply 1 application. topically 4 (four) times daily as needed (pain).   diltiazem 120 MG 24 hr capsule Commonly known as: Cardizem CD Take 1 capsule (120 mg total) by mouth daily.   Eliquis 5 MG Tabs tablet Generic drug: apixaban TAKE 1 TABLET BY MOUTH 2 TIMES DAILY What changed: when to take this   gabapentin 300  MG capsule Commonly known as: NEURONTIN Take 300 mg by mouth 3 (three) times daily.   glimepiride 1 MG tablet Commonly known as: AMARYL Take 1 mg by mouth daily with breakfast.   glucose blood test strip USE TO CHECK BLOOD SUGAR ONCE DAILY ALTERNATING BETWEEN BEFORE BREAKFAST AND BEFORE DINNER   hydroxychloroquine 200 MG tablet Commonly known as: PLAQUENIL Take 200 mg by mouth daily.   levothyroxine 100 MCG tablet Commonly known as: SYNTHROID Take 100 mcg by mouth daily.   naproxen sodium 220 MG tablet Commonly known as: ALEVE Take 220-440 mg by mouth 2 (two) times daily as needed (Pain).   omeprazole 20 MG capsule Commonly known as: PRILOSEC Take 20 mg by mouth daily as needed (Indigestion).   spironolactone 25 MG tablet Commonly known as: ALDACTONE Take 0.5 tablets (12.5 mg total) by mouth daily.   traMADol 50 MG tablet Commonly known as: ULTRAM Take 1 tablet (50 mg total) by mouth every 6 (six) hours as needed.        Follow-up Information     [name], MD Follow up on 04/12/2023.   Specialty: General Surgery Why: for drain removal  call office for appointment Contact information: [address] [place] [address] [phone]                 Signed: [name] 04/01/2023, 7:14 AM

## 2023-04-01 NOTE — Plan of Care (Signed)

## 2023-04-01 NOTE — Progress Notes (Signed)
 Patient ID: [name], female   DOB: 23-Apr-1940, 83 y.o.   MRN: [id number]   Doing well Pain controlled Flaps viable Drain serosanguinous  Plan D/c home

## 2023-04-01 NOTE — Discharge Instructions (Signed)
[facility] [phone]  MASTECTOMY: POST OP INSTRUCTIONS  Always review your discharge instruction sheet given to you by the facility where your surgery was performed. IF YOU HAVE DISABILITY OR FAMILY LEAVE FORMS, YOU MUST BRING THEM TO THE OFFICE FOR PROCESSING.   DO NOT GIVE THEM TO YOUR DOCTOR. A prescription for pain medication may be given to you upon discharge.  Take your pain medication as prescribed, if needed.  If narcotic pain medicine is not needed, then you may take acetaminophen (Tylenol) or ibuprofen (Advil) as needed. Take your usually prescribed medications unless otherwise directed. If you need a refill on your pain medication, please contact your pharmacy.  They will contact our office to request authorization.  Prescriptions will not be filled after 5pm or on week-ends. You should follow a light diet the first few days after arrival home, such as soup and crackers, etc.  Resume your normal diet the day after surgery. Most patients will experience some swelling and bruising on the chest and underarm.  Ice packs will help.  Swelling and bruising can take several days to resolve.  It is common to experience some constipation if taking pain medication after surgery.  Increasing fluid intake and taking a stool softener (such as Colace) will usually help or prevent this problem from occurring.  A mild laxative (Milk of Magnesia or Miralax) should be taken according to package instructions if there are no bowel movements after 48 hours. Unless discharge instructions indicate otherwise, leave your bandage dry and in place until your next appointment in 3-5 days.  You may take a limited sponge bath.  No tube baths or showers until the drains are removed.  You may have steri-strips (small skin tapes) in place directly over the incision.  These strips should be left on the skin for 7-10 days.  If your surgeon used skin glue on the incision, you may shower in 24 hours.   The glue will flake off over the next 2-3 weeks.  Any sutures or staples will be removed at the office during your follow-up visit. DRAINS:  If you have drains in place, it is important to keep a list of the amount of drainage produced each day in your drains.  Before leaving the hospital, you should be instructed on drain care.  Call our office if you have any questions about your drains. ACTIVITIES:  You may resume regular (light) daily activities beginning the next day--such as daily self-care, walking, climbing stairs--gradually increasing activities as tolerated.  You may have sexual intercourse when it is comfortable.  Refrain from any heavy lifting or straining until approved by your doctor. You may drive when you are no longer taking prescription pain medication, you can comfortably wear a seatbelt, and you can safely maneuver your car and apply brakes. RETURN TO WORK:  __________________________________________________________ You should see your doctor in the office for a follow-up appointment approximately 3-5 days after your surgery.  Your doctor's nurse will typically make your follow-up appointment when she calls you with your pathology report.  Expect your pathology report 2-3 business days after your surgery.  You may call to check if you do not hear from us after three days.   OTHER INSTRUCTIONS: ______________________________________________________________________________________________ ____________________________________________________________________________________________ WHEN TO CALL YOUR DOCTOR: Fever over 101.0 Nausea and/or vomiting Extreme swelling or bruising Continued bleeding from incision. Increased pain, redness, or drainage from the incision. The clinic staff is available to answer your questions during regular business hours.  Please don't hesitate   to call and ask to speak to one of the nurses for clinical concerns.  If you have a medical emergency, go to the  nearest emergency room or call 911.  A surgeon from [facility] is always on call at the hospital. [address], [address], [place], [place]  [address] ? [address] [address], [place], [place]   [address] [phone] ? [phone] ? FAX [phone]       JP Drain Totals Bring this sheet to all of your post-operative appointments while you have your drains. Please measure your drains by CC's or ML's. Make sure you drain and measure your JP Drains 3 times per day. At the end of each day, add up totals for the left side and add up totals for the right side.    ( 9 am )     ( 3 pm )        ( 9 pm )                Date L  R  L  R  L  R  Total L/R                                                                                                               

## 2023-04-07 ENCOUNTER — Encounter: Payer: Self-pay | Admitting: *Deleted

## 2023-04-07 LAB — SURGICAL PATHOLOGY

## 2023-04-08 ENCOUNTER — Inpatient Hospital Stay: Payer: 59 | Attending: Hematology and Oncology | Admitting: Hematology and Oncology

## 2023-04-08 ENCOUNTER — Encounter: Payer: Self-pay | Admitting: *Deleted

## 2023-04-08 VITALS — BP 139/69 | HR 74 | Temp 97.4°F | Resp 17 | Wt 142.8 lb

## 2023-04-08 DIAGNOSIS — Z1732 Human epidermal growth factor receptor 2 negative status: Secondary | ICD-10-CM | POA: Insufficient documentation

## 2023-04-08 DIAGNOSIS — C50512 Malignant neoplasm of lower-outer quadrant of left female breast: Secondary | ICD-10-CM | POA: Insufficient documentation

## 2023-04-08 DIAGNOSIS — Z170 Estrogen receptor positive status [ER+]: Secondary | ICD-10-CM | POA: Diagnosis not present

## 2023-04-08 DIAGNOSIS — Z79811 Long term (current) use of aromatase inhibitors: Secondary | ICD-10-CM | POA: Diagnosis not present

## 2023-04-08 DIAGNOSIS — Z9012 Acquired absence of left breast and nipple: Secondary | ICD-10-CM | POA: Insufficient documentation

## 2023-04-08 DIAGNOSIS — Z923 Personal history of irradiation: Secondary | ICD-10-CM | POA: Insufficient documentation

## 2023-04-08 DIAGNOSIS — Z1722 Progesterone receptor negative status: Secondary | ICD-10-CM | POA: Insufficient documentation

## 2023-04-08 MED ORDER — ANASTROZOLE 1 MG PO TABS: 1.0000 mg | ORAL_TABLET | Freq: Every day | ORAL | 3 refills | Status: DC

## 2023-04-08 NOTE — Assessment & Plan Note (Addendum)
 Breast Cancer Recurrence New left breast mass identified incidentally on CT scan. History of breast cancer in 2009 treated with lumpectomy and radiation.  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 She is not a candidate for irradiation. Anastrozole recommended due to age and frailty precluding chemotherapy; radiation not required. Well-tolerated with minimal side effects. Bone density monitoring necessary. -Baseline bone density normal.  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  General Health Maintenance / Followup Plans Genetic testing  negative RTC in 3 months with [facility].

## 2023-04-08 NOTE — Progress Notes (Signed)
 [facility] CONSULT NOTE  Patient Care Team: Merri Brunette, MD as PCP - General (Family Medicine) Yates Decamp, MD as PCP - Cardiology (Cardiology) Lanier Prude, MD as PCP - Electrophysiology (Cardiology) Zenovia Jordan, MD as Consulting Physician (Rheumatology) Pershing Proud, RN as Oncology Nurse Navigator Donnelly Angelica, RN as Oncology Nurse Navigator Rachel Moulds, MD as Consulting Physician (Hematology and Oncology)  CHIEF COMPLAINTS/PURPOSE OF CONSULTATION:  Newly diagnosed breast cancer  HISTORY OF PRESENTING ILLNESS:  [name] 83 y.o. female is here because of recent diagnosis of left breast IDC  I reviewed her records extensively and collaborated the history with the patient.  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 1.1 cm probable cystic appearing mass lies slightly above and lateral to the lumpectomy bed calcification. Aspiration to confirm this as a benign cyst is recommended.   01/08/2023 Breast US   Ultrasound-guided cyst aspiration of the 1.1 cm presumed complicated cyst in the left breast at 10 o'clock, 2 cm the nipple. If this does not aspirate, ultrasound-guided core needle biopsy would be indicated.     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 VUS identified in BRCA2 p.N8295A (c.4813G>A) and ALK p.T656I (c.1967C>T). The CancerNext-Expanded gene panel offered by [facility] and includes sequencing, rearrangement, and RNA analysis for the following 76 genes: AIP, ALK, APC, ATM, AXIN2, BAP1, BARD1, BMPR1A, BRCA1, BRCA2, BRIP1, CDC73, CDH1, CDK4, CDKN1B, CDKN2A, CEBPA, CHEK2, CTNNA1, DDX41, DICER1, ETV6, FH, FLCN, GATA2, LZTR1, MAX, MBD4, MEN1, MET, MLH1, MSH2, MSH3, MSH6, MUTYH, NF1, NF2, NTHL1, PALB2, PHOX2B, PMS2, POT1, PRKAR1A, PTCH1, PTEN, RAD51C, RAD51D, RB1, RET, RUNX1, SDHA, SDHAF2, SDHB, SDHC, SDHD, SMAD4, SMARCA4, SMARCB1, SMARCE1, STK11, SUFU, TMEM127, TP53, TSC1, TSC2, VHL, and WT1 (sequencing and deletion/duplication); EGFR, HOXB13, KIT, MITF, PDGFRA, POLD1, and POLE (sequencing only); EPCAM and GREM1 (deletion/duplication only). Report date 03/08/23.     Discussed the use of AI scribe software for clinical note transcription with the patient, who gave verbal consent to proceed.  History of Present Illness     The patient is an 83 year old with breast cancer who presents for post-mastectomy follow-up.  She underwent a mastectomy for breast cancer, during which a 1.6 cm tumor was removed with clear surgical margins. She experiences minimal pain post-surgery, with some discomfort managed by medication.  The tumor was high grade, PR neg and with high Ki 67. Due to her age, fraility, ? Memory issues and previous radiation therapy, she is not a candidate for chemotherapy and re irradiation. She is here to discuss about anti estrogen therapy.   Her past medical history includes lupus, for which she is on Plaquenil. A bone density scan  in August 2023 showed strong bones, with no indication of osteoporosis.  She maintains some level of physical activity and  is encouraged to keep walking. Her memory is not at its best, but she manages her medications well with the help of a pill box. No significant side effects from her current medications.   MEDICAL HISTORY:  Past Medical History:  Diagnosis Date   Acute bronchitis 12/17/2021   Acute cystitis 02/20/2019   Arthritis    Atrial flutter (HCC)    Breast cancer (HCC)    left  2009 and 2025   Diabetes mellitus without complication (HCC)    Dyspnea    mostly with exertion   GERD (gastroesophageal reflux disease)    Hypertension    Hypothyroidism    Libman Sacks endocarditis (HCC)    Lupus    Pneumonia 07/31/2021    SURGICAL HISTORY: Past Surgical History:  Procedure Laterality Date   BREAST BIOPSY Left 01/15/2023   [place] LT BREAST BX W LOC DEV 1ST LESION IMG BX SPEC US GUIDE 01/15/2023 [facility] MAMMOGRAPHY   BREAST LUMPECTOMY Left 2009   IR 3D INDEPENDENT WKST  05/29/2021   IR ANGIO INTRA EXTRACRAN SEL COM CAROTID INNOMINATE BILAT MOD SED  05/29/2021   IR ANGIO VERTEBRAL SEL VERTEBRAL BILAT MOD SED  05/29/2021   SIMPLE MASTECTOMY WITH AXILLARY SENTINEL NODE BIOPSY Left 03/31/2023   Procedure: LEFT MASTECTOMY;  Surgeon: [name], MD;  Location: [facility] OR;  Service: General;  Laterality: Left;  LMA PEC BLOCK    SOCIAL HISTORY: Social History   Socioeconomic History   Marital status: Single    Spouse name: Not on file   Number of children: 2   Years of education: Not on file   Highest education level: Not on file  Occupational History   Not on file  Tobacco Use   Smoking status: Never   Smokeless tobacco: Current    Types: Chew  Vaping Use   Vaping status: Never Used  Substance and Sexual Activity   Alcohol use: No   Drug use: No   Sexual activity: Not Currently  Other Topics Concern   Not on file  Social History Narrative   Not on file   Social Drivers of Health   Financial Resource Strain: Not on file  Food Insecurity: No Food Insecurity (03/31/2023)   Hunger Vital  Sign    Worried About Running Out of Food in the Last Year: Never true    Ran Out of Food in the Last Year: Never true  Transportation Needs: No Transportation Needs (03/31/2023)   PRAPARE - Administrator, Civil Service (Medical): No    Lack of Transportation (Non-Medical): No  Physical Activity: Not on file  Stress: Not on file  Social Connections: Unknown (03/31/2023)   Social Connection and Isolation Panel [NHANES]    Frequency of Communication with Friends and Family: More than three times a week    Frequency of Social Gatherings with Friends and Family: More than three times a week    Attends Religious Services: More than 4 times per year    Active Member of [employer] or Organizations: No    Attends Banker Meetings: 1 to 4 times per year    Marital Status: Patient declined  Intimate Partner Violence: Not At Risk (03/31/2023)   Humiliation, Afraid, Rape, and Kick questionnaire    Fear of Current or Ex-Partner: No    Emotionally Abused: No    Physically Abused: No  Sexually Abused: No    FAMILY HISTORY: Family History  Problem Relation Age of Onset   Prostate cancer Father 21 - 79       metaststic   Asthma Brother    Breast cancer Daughter 68    ALLERGIES:  is allergic to ciprofloxacin, lisinopril, crestor [rosuvastatin], and ofev [nintedanib].  MEDICATIONS:  Current Outpatient Medications  Medication Sig Dispense Refill   anastrozole (ARIMIDEX) 1 MG tablet Take 1 tablet (1 mg total) by mouth daily. 90 tablet 3   Accu-Chek Softclix Lancets lancets SMARTSIG:Topical     Blood Glucose Monitoring Suppl (ACCU-CHEK GUIDE) w/Device KIT USE TO CHECK BLOOD SUGAR AS DIRECTED BY PHYSICIAN.     dextromethorphan-guaiFENesin (MUCINEX DM) 30-600 MG 12hr tablet Take 1 tablet by mouth 2 (two) times daily. (Patient taking differently: Take 1 tablet by mouth 2 (two) times daily as needed for cough.) 30 tablet 0   diclofenac Sodium (VOLTAREN) 1 % GEL Apply 1 application.  topically 4 (four) times daily as needed (pain).     diltiazem (CARDIZEM CD) 120 MG 24 hr capsule Take 1 capsule (120 mg total) by mouth daily. 90 capsule 3   ELIQUIS 5 MG TABS tablet TAKE 1 TABLET BY MOUTH 2 TIMES DAILY (Patient taking differently: Take 5 mg by mouth daily.) 60 tablet 3   gabapentin (NEURONTIN) 300 MG capsule Take 300 mg by mouth 3 (three) times daily.     glimepiride (AMARYL) 1 MG tablet Take 1 mg by mouth daily with breakfast.     glucose blood test strip USE TO CHECK BLOOD SUGAR ONCE DAILY ALTERNATING BETWEEN BEFORE BREAKFAST AND BEFORE DINNER     hydroxychloroquine (PLAQUENIL) 200 MG tablet Take 200 mg by mouth daily.     levothyroxine (SYNTHROID) 100 MCG tablet Take 100 mcg by mouth daily.     naproxen sodium (ALEVE) 220 MG tablet Take 220-440 mg by mouth 2 (two) times daily as needed (Pain).     omeprazole (PRILOSEC) 20 MG capsule Take 20 mg by mouth daily as needed (Indigestion).     spironolactone (ALDACTONE) 25 MG tablet Take 0.5 tablets (12.5 mg total) by mouth daily. 45 tablet 3   traMADol (ULTRAM) 50 MG tablet Take 1 tablet (50 mg total) by mouth every 6 (six) hours as needed. 20 tablet 0   No current facility-administered medications for this visit.    REVIEW OF SYSTEMS:   Constitutional: Denies fevers, chills or abnormal night sweats Eyes: Denies blurriness of vision, double vision or watery eyes Ears, nose, mouth, throat, and face: Denies mucositis or sore throat Respiratory: Denies cough, dyspnea or wheezes Cardiovascular: Denies palpitation, chest discomfort or lower extremity swelling Gastrointestinal:  Denies nausea, heartburn or change in bowel habits Skin: Denies abnormal skin rashes Lymphatics: Denies new lymphadenopathy or easy bruising Neurological:Denies numbness, tingling or new weaknesses Behavioral/Psych: Mood is stable, no new changes  Breast: Denies any palpable lumps or discharge All other systems were reviewed with the patient and are  negative.  PHYSICAL EXAMINATION: ECOG PERFORMANCE STATUS: 1 - Symptomatic but completely ambulatory  Vitals:   04/08/23 1109  BP: 139/69  Pulse: 74  Resp: 17  Temp: (!) 97.4 F (36.3 C)  SpO2: 99%    Filed Weights   04/08/23 1109  Weight: 142 lb 12.8 oz (64.8 kg)     GENERAL:alert, no distress and comfortable  LABORATORY DATA:  I have reviewed the data as listed Lab Results  Component Value Date   WBC 5.5 04/01/2023   HGB 9.6 (  L) 04/01/2023   HCT 28.8 (L) 04/01/2023   MCV 88.6 04/01/2023   PLT 195 04/01/2023   Lab Results  Component Value Date   NA 134 (L) 04/01/2023   K 4.1 04/01/2023   CL 105 04/01/2023   CO2 23 04/01/2023    RADIOGRAPHIC STUDIES: I have personally reviewed the radiological reports and agreed with the findings in the report.  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 History of breast cancer in 2009 treated with lumpectomy and radiation.  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 She is not a candidate for irradiation. Anastrozole recommended due to age and frailty precluding chemotherapy; radiation not required. Well-tolerated with minimal side effects. Bone density monitoring necessary. -Baseline bone density normal.  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  General Health Maintenance / Followup Plans Genetic testing  negative RTC in 3 months with SCP.   I have asked her to think about the treatment options we discussed today I talked to our NN to engage with her tomorrow to see if she would like to proceed with surgery. All questions were answered. The patient knows to call the clinic with any problems, questions or concerns.    Rachel Moulds, MD 04/08/23

## 2023-04-09 ENCOUNTER — Other Ambulatory Visit: Payer: Self-pay | Admitting: Emergency Medicine

## 2023-04-09 ENCOUNTER — Telehealth: Payer: Self-pay | Admitting: Hematology and Oncology

## 2023-04-09 NOTE — Telephone Encounter (Signed)
 Called patient didn't receive an answer, unable to leave vm mailbox full

## 2023-04-09 NOTE — Telephone Encounter (Signed)
 Prescription refill request for Eliquis received. Indication:aflutter Last office visit:2/25 Scr:1.05  3/25 Age: 83 Weight:64.8  kg  Prescription refilled

## 2023-04-15 ENCOUNTER — Ambulatory Visit: Payer: 59 | Attending: Cardiology | Admitting: Cardiology

## 2023-04-15 ENCOUNTER — Encounter: Payer: Self-pay | Admitting: Cardiology

## 2023-04-15 VITALS — BP 106/78 | HR 78 | Ht 67.0 in | Wt 142.0 lb

## 2023-04-15 DIAGNOSIS — I10 Essential (primary) hypertension: Secondary | ICD-10-CM

## 2023-04-15 DIAGNOSIS — I483 Typical atrial flutter: Secondary | ICD-10-CM | POA: Diagnosis not present

## 2023-04-15 NOTE — Progress Notes (Signed)
  Electrophysiology Office Note:    Date:  04/15/2023   ID:  [name], [name] [birth year], MRN [id number]  [facility] Cardiologist:  Yates Decamp, MD  [facility] Electrophysiologist:  Lanier Prude, MD   Referring MD: Yates Decamp, MD   Chief Complaint: Atrial flutter  History of Present Illness:    [name] is an 83 year old woman who I am seeing today for an evaluation of atrial flutter at the request of Dr. Jacinto Halim.  The patient has a history of hypertension, hyperlipidemia, lupus on Plaquenil, breast cancer, interstitial lung disease.  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  She was admitted in September 2020 for with atrial flutter.  This was symptomatic.  She was started on anticoagulation and spontaneously cardioverted back to sinus rhythm.  The patient is being sent to EP to discuss catheter ablation for atrial flutter in an effort to avoid long-term use of anticoagulation.  Today she tells me she has not had a recurrence of arrhythmia.  Her prior episode was in the setting of pneumonia.  She takes her Eliquis without missed doses.  She tolerates the blood thinner without any bleeding issues.    Their past medical, social and family history was reviewed.   ROS:   Please see the history of present illness.    All other systems reviewed and are negative.  EKGs/Labs/Other Studies Reviewed:    The following studies were reviewed today:  October 11, 2022 EKG shows typical appearing atrial flutter with 2-1 AV block.  Ventricular rate 140 bpm.    October 12, 2022 echo EF 60% RV normal Mildly dilated left and right atrium Mild MR          Physical Exam:    VS:  BP 106/78 (BP Location: Right Arm, Patient Position: Sitting, Cuff Size: Normal)   Pulse 78   Ht 5\' 7"  (1.702 m)   Wt 142 lb (64.4 kg)   SpO2 97%   BMI 22.24 kg/m     Wt Readings from Last 3 Encounters:  04/15/23  142 lb (64.4 kg)  04/08/23 142 lb 12.8 oz (64.8 kg)  03/31/23 145 lb (65.8 kg)     GEN: no distress CARD: RRR, No MRG RESP: No IWOB. CTAB.        ASSESSMENT AND PLAN:    1. Typical atrial flutter (HCC)   2. Primary hypertension     #Atrial flutter Typical appearing.  Led to hospitalization in September 2024.  Is currently on Eliquis for stroke prophylaxis.  I discussed treatment options for her atrial flutter including conservative medical management and catheter ablation.  She is not interested in pursuing a conservative management strategy for now which is understandable.  I do think she would be a candidate for catheter ablation if she were to have a recurrence.  She will follow-up with EP on an as-needed basis. Continue Eliquis for now.  She is tolerating this medication without evidence of bleeding.  #Hypertension At goal today.  Recommend checking blood pressures 1-2 times per week at home and recording the values.  Recommend bringing these recordings to the primary care physician.  Follow-up with EP on an as-needed basis.  Continue routine follow-up with Dr. Jacinto Halim.    Signed, Rossie Muskrat. Lalla Brothers, MD, [facility], [facility] 04/15/2023 12:38 PM    Electrophysiology [facility]

## 2023-04-15 NOTE — Patient Instructions (Signed)
 Medication Instructions:  Your physician recommends that you continue on your current medications as directed. Please refer to the Current Medication list given to you today.  *If you need a refill on your cardiac medications before your next appointment, please call your pharmacy*  Follow-Up: At [facility], you and your health needs are our priority.  As part of our continuing mission to provide you with exceptional heart care, we have created designated Provider Care Teams.  These Care Teams include your primary Cardiologist (physician) and Advanced Practice Providers (APPs -  Physician Assistants and Nurse Practitioners) who all work together to provide you with the care you need, when you need it.  Your next appointment:   As needed with Dr. Lalla Brothers

## 2023-04-29 DIAGNOSIS — C50512 Malignant neoplasm of lower-outer quadrant of left female breast: Secondary | ICD-10-CM | POA: Diagnosis not present

## 2023-05-03 ENCOUNTER — Telehealth: Payer: Self-pay | Admitting: Internal Medicine

## 2023-05-03 NOTE — Telephone Encounter (Signed)
 Cmn received from [facility] for nebulizer

## 2023-05-05 NOTE — Telephone Encounter (Signed)
 CMN faxed successfully and signed.

## 2023-05-18 DIAGNOSIS — R1310 Dysphagia, unspecified: Secondary | ICD-10-CM | POA: Diagnosis not present

## 2023-05-24 DIAGNOSIS — C50512 Malignant neoplasm of lower-outer quadrant of left female breast: Secondary | ICD-10-CM | POA: Diagnosis not present

## 2023-05-26 DIAGNOSIS — E1141 Type 2 diabetes mellitus with diabetic mononeuropathy: Secondary | ICD-10-CM | POA: Diagnosis not present

## 2023-05-26 DIAGNOSIS — J432 Centrilobular emphysema: Secondary | ICD-10-CM | POA: Diagnosis not present

## 2023-05-26 DIAGNOSIS — E1169 Type 2 diabetes mellitus with other specified complication: Secondary | ICD-10-CM | POA: Diagnosis not present

## 2023-05-26 DIAGNOSIS — I10 Essential (primary) hypertension: Secondary | ICD-10-CM | POA: Diagnosis not present

## 2023-06-03 DIAGNOSIS — C50512 Malignant neoplasm of lower-outer quadrant of left female breast: Secondary | ICD-10-CM | POA: Diagnosis not present

## 2023-06-10 ENCOUNTER — Ambulatory Visit: Admission: EM | Admit: 2023-06-10 | Discharge: 2023-06-10 | Disposition: A | Discharge status: Home / Self Care

## 2023-06-10 ENCOUNTER — Encounter: Payer: Self-pay | Admitting: Emergency Medicine

## 2023-06-10 DIAGNOSIS — B349 Viral infection, unspecified: Secondary | ICD-10-CM | POA: Diagnosis not present

## 2023-06-10 MED ORDER — ALBUTEROL SULFATE HFA 108 (90 BASE) MCG/ACT IN AERS: 1.0000 | INHALATION_SPRAY | Freq: Four times a day (QID) | RESPIRATORY_TRACT | 0 refills | Status: DC | PRN

## 2023-06-10 MED ORDER — PROMETHAZINE-DM 6.25-15 MG/5ML PO SYRP
5.0000 mL | ORAL_SOLUTION | Freq: Four times a day (QID) | ORAL | 0 refills | Status: DC | PRN
Start: 2023-06-10 — End: 2023-11-01

## 2023-06-10 MED ORDER — PREDNISONE 20 MG PO TABS: 40.0000 mg | ORAL_TABLET | Freq: Every day | ORAL | 0 refills | Status: AC

## 2023-06-10 NOTE — ED Provider Notes (Signed)
 [facility]  Note:  This document was prepared using Conservation officer, historic buildings and may include unintentional dictation errors.  MRN: [id number] DOB: [birth year]  Subjective:   [name] is a 83 y.o. female presenting for nasal congestion, cough, intermittent sore throat x 4 days.  Patient reports that her granddaughter was sick with a cough for several days prior to her onset of her symptoms.  Patient denies any shortness of breath, chest pain, weakness, dizziness.  Patient has not taken any over-the-counter medication to treat symptoms.  No known exposure to COVID, flu, strep.  No current facility-administered medications for this encounter.  Current Outpatient Medications:    albuterol  (VENTOLIN  HFA) 108 (90 Base) MCG/ACT inhaler, Inhale 1-2 puffs into the lungs every 6 (six) hours as needed for wheezing or shortness of breath., Disp: 8 g, Rfl: 0   azithromycin  (ZITHROMAX ) 250 MG tablet, Take by mouth. (Patient not taking: Reported on 06/10/2023), Disp: , Rfl:    predniSONE  (DELTASONE ) 20 MG tablet, Take 2 tablets (40 mg total) by mouth daily for 5 days., Disp: 10 tablet, Rfl: 0   promethazine-dextromethorphan  (PROMETHAZINE-DM) 6.25-15 MG/5ML syrup, Take 5 mLs by mouth 4 (four) times daily as needed for cough., Disp: 118 mL, Rfl: 0   Accu-Chek Softclix Lancets lancets, SMARTSIG:Topical, Disp: , Rfl:    anastrozole  (ARIMIDEX ) 1 MG tablet, Take 1 tablet (1 mg total) by mouth daily., Disp: 90 tablet, Rfl: 3   apixaban  (ELIQUIS ) 5 MG TABS tablet, TAKE 1 TABLET BY MOUTH 2 TIMES DAILY, Disp: 60 tablet, Rfl: 5   Blood Glucose Monitoring Suppl (ACCU-CHEK GUIDE) w/Device KIT, USE TO CHECK BLOOD SUGAR AS DIRECTED BY PHYSICIAN., Disp: , Rfl:    dextromethorphan -guaiFENesin  (MUCINEX  DM) 30-600 MG 12hr tablet, Take 1 tablet by mouth 2 (two) times daily. (Patient not taking: Reported on 06/10/2023), Disp: 30 tablet, Rfl: 0   diclofenac  Sodium (VOLTAREN ) 1 % GEL, Apply 1 application.  topically 4 (four) times daily as needed (pain)., Disp: , Rfl:    diltiazem  (CARDIZEM  CD) 120 MG 24 hr capsule, Take 1 capsule (120 mg total) by mouth daily., Disp: 90 capsule, Rfl: 3   gabapentin  (NEURONTIN ) 300 MG capsule, Take 300 mg by mouth 3 (three) times daily., Disp: , Rfl:    glimepiride  (AMARYL ) 1 MG tablet, Take 1 mg by mouth daily with breakfast., Disp: , Rfl:    glucose blood test strip, USE TO CHECK BLOOD SUGAR ONCE DAILY ALTERNATING BETWEEN BEFORE BREAKFAST AND BEFORE DINNER, Disp: , Rfl:    hydroxychloroquine  (PLAQUENIL ) 200 MG tablet, Take 200 mg by mouth daily., Disp: , Rfl:    levothyroxine  (SYNTHROID ) 100 MCG tablet, Take 100 mcg by mouth daily., Disp: , Rfl:    naproxen sodium (ALEVE) 220 MG tablet, Take 220-440 mg by mouth 2 (two) times daily as needed (Pain)., Disp: , Rfl:    omeprazole (PRILOSEC) 20 MG capsule, Take 20 mg by mouth daily as needed (Indigestion)., Disp: , Rfl:    spironolactone  (ALDACTONE ) 25 MG tablet, Take 0.5 tablets (12.5 mg total) by mouth daily., Disp: 45 tablet, Rfl: 3   traMADol  (ULTRAM ) 50 MG tablet, Take 1 tablet (50 mg total) by mouth every 6 (six) hours as needed. (Patient not taking: Reported on 06/10/2023), Disp: 20 tablet, Rfl: 0   Allergies  Allergen Reactions   Ciprofloxacin Itching, Swelling and Other (See Comments)    Possibly causing tremors?   Lisinopril  Cough   Crestor  [Rosuvastatin ] Other (See Comments)    Myalgia and back  pain   Ofev  [Nintedanib] Nausea Only    Past Medical History:  Diagnosis Date   Acute bronchitis 12/17/2021   Acute cystitis 02/20/2019   Arthritis    Atrial flutter (HCC)    Breast cancer (HCC)    left  2009 and 2025   Diabetes mellitus without complication (HCC)    Dyspnea    mostly with exertion   GERD (gastroesophageal reflux disease)    Hypertension    Hypothyroidism    Libman Sacks endocarditis (HCC)    Lupus    Pneumonia 07/31/2021     Past Surgical History:  Procedure Laterality Date    BREAST BIOPSY Left 01/15/2023   US  LT BREAST BX W LOC DEV 1ST LESION IMG BX SPEC US  GUIDE 01/15/2023 [facility] MAMMOGRAPHY   BREAST LUMPECTOMY Left 2009   IR 3D INDEPENDENT WKST  05/29/2021   IR ANGIO INTRA EXTRACRAN SEL COM CAROTID INNOMINATE BILAT MOD SED  05/29/2021   IR ANGIO VERTEBRAL SEL VERTEBRAL BILAT MOD SED  05/29/2021   SIMPLE MASTECTOMY WITH AXILLARY SENTINEL NODE BIOPSY Left 03/31/2023   Procedure: LEFT MASTECTOMY;  Surgeon: Oza Blumenthal, MD;  Location: [facility] OR;  Service: General;  Laterality: Left;  LMA PEC BLOCK    Family History  Problem Relation Age of Onset   Prostate cancer Father 89 - 67       metaststic   Asthma Brother    Breast cancer Daughter 38    Social History   Tobacco Use   Smoking status: Never   Smokeless tobacco: Current    Types: Chew  Vaping Use   Vaping status: Never Used  Substance Use Topics   Alcohol  use: No   Drug use: No    ROS Refer to HPI for ROS details.  Objective:   Vitals: BP 109/65 (BP Location: Right Arm)   Pulse 83   Temp 98.7 F (37.1 C) (Oral)   Resp 18   SpO2 94%   Physical Exam Vitals and nursing note reviewed.  Constitutional:      General: She is not in acute distress.    Appearance: Normal appearance. She is well-developed. She is not ill-appearing or toxic-appearing.  HENT:     Head: Normocephalic and atraumatic.     Nose: Congestion and rhinorrhea present.     Mouth/Throat:     Mouth: Mucous membranes are moist.     Pharynx: Oropharynx is clear. No oropharyngeal exudate or posterior oropharyngeal erythema.  Eyes:     Extraocular Movements: Extraocular movements intact.     Conjunctiva/sclera: Conjunctivae normal.  Cardiovascular:     Rate and Rhythm: Normal rate and regular rhythm.     Heart sounds: Normal heart sounds. No murmur heard. Pulmonary:     Effort: Pulmonary effort is normal. No respiratory distress.     Breath sounds: No stridor. Wheezing present. No rhonchi or rales.  Chest:      Chest wall: No tenderness.  Skin:    General: Skin is warm and dry.  Neurological:     General: No focal deficit present.     Mental Status: She is alert and oriented to person, place, and time.  Psychiatric:        Mood and Affect: Mood normal.        Behavior: Behavior normal.     Procedures  No results found for this or any previous visit (from the past 24 hours).  No results found.   Assessment and Plan :     Discharge Instructions  1. Acute viral syndrome (Primary) - promethazine-dextromethorphan  (PROMETHAZINE-DM) 6.25-15 MG/5ML syrup; Take 5 mLs by mouth 4 (four) times daily as needed for cough.  Dispense: 118 mL; Refill: 0 - predniSONE  (DELTASONE ) 20 MG tablet; Take 2 tablets (40 mg total) by mouth daily for 5 days.  Dispense: 10 tablet; Refill: 0 - albuterol  (VENTOLIN  HFA) 108 (90 Base) MCG/ACT inhaler; Inhale 1-2 puffs into the lungs every 6 (six) hours as needed for wheezing or shortness of breath.  Dispense: 8 g; Refill: 0 -Continue to monitor symptoms for any change in severity if there is any escalation of current symptoms or development of new symptoms follow-up in ER for further evaluation and management.     Quadir Muns B Ludmilla Mcgillis   Merdis Snodgrass, Tatamy B, NP 06/10/23 1758

## 2023-06-10 NOTE — ED Triage Notes (Signed)
 Pt reports nasal congestion and productive cough x4 days. Denies fevers, chills, and sore throat. No sick contacts and no med use for symptoms.

## 2023-06-10 NOTE — Discharge Instructions (Signed)
  1. Acute viral syndrome (Primary) - promethazine-dextromethorphan  (PROMETHAZINE-DM) 6.25-15 MG/5ML syrup; Take 5 mLs by mouth 4 (four) times daily as needed for cough.  Dispense: 118 mL; Refill: 0 - predniSONE  (DELTASONE ) 20 MG tablet; Take 2 tablets (40 mg total) by mouth daily for 5 days.  Dispense: 10 tablet; Refill: 0 - albuterol  (VENTOLIN  HFA) 108 (90 Base) MCG/ACT inhaler; Inhale 1-2 puffs into the lungs every 6 (six) hours as needed for wheezing or shortness of breath.  Dispense: 8 g; Refill: 0 -Continue to monitor symptoms for any change in severity if there is any escalation of current symptoms or development of new symptoms follow-up in ER for further evaluation and management.

## 2023-06-19 ENCOUNTER — Other Ambulatory Visit: Payer: Self-pay

## 2023-06-19 ENCOUNTER — Observation Stay (HOSPITAL_COMMUNITY)
Admission: EM | Admit: 2023-06-19 | Discharge: 2023-06-20 | Disposition: A | Discharge status: Home / Self Care | Attending: Family Medicine | Admitting: Family Medicine

## 2023-06-19 ENCOUNTER — Emergency Department (HOSPITAL_COMMUNITY)

## 2023-06-19 ENCOUNTER — Inpatient Hospital Stay (HOSPITAL_BASED_OUTPATIENT_CLINIC_OR_DEPARTMENT_OTHER)

## 2023-06-19 ENCOUNTER — Encounter (HOSPITAL_COMMUNITY): Payer: Self-pay | Admitting: Emergency Medicine

## 2023-06-19 DIAGNOSIS — I10 Essential (primary) hypertension: Secondary | ICD-10-CM | POA: Diagnosis not present

## 2023-06-19 DIAGNOSIS — E876 Hypokalemia: Secondary | ICD-10-CM | POA: Insufficient documentation

## 2023-06-19 DIAGNOSIS — R0602 Shortness of breath: Secondary | ICD-10-CM | POA: Diagnosis present

## 2023-06-19 DIAGNOSIS — R531 Weakness: Secondary | ICD-10-CM | POA: Diagnosis not present

## 2023-06-19 DIAGNOSIS — Z79899 Other long term (current) drug therapy: Secondary | ICD-10-CM | POA: Insufficient documentation

## 2023-06-19 DIAGNOSIS — R0600 Dyspnea, unspecified: Secondary | ICD-10-CM

## 2023-06-19 DIAGNOSIS — I4892 Unspecified atrial flutter: Secondary | ICD-10-CM | POA: Diagnosis not present

## 2023-06-19 DIAGNOSIS — I130 Hypertensive heart and chronic kidney disease with heart failure and stage 1 through stage 4 chronic kidney disease, or unspecified chronic kidney disease: Secondary | ICD-10-CM | POA: Insufficient documentation

## 2023-06-19 DIAGNOSIS — R918 Other nonspecific abnormal finding of lung field: Secondary | ICD-10-CM | POA: Insufficient documentation

## 2023-06-19 DIAGNOSIS — I2699 Other pulmonary embolism without acute cor pulmonale: Principal | ICD-10-CM | POA: Insufficient documentation

## 2023-06-19 DIAGNOSIS — R42 Dizziness and giddiness: Secondary | ICD-10-CM | POA: Diagnosis not present

## 2023-06-19 DIAGNOSIS — I129 Hypertensive chronic kidney disease with stage 1 through stage 4 chronic kidney disease, or unspecified chronic kidney disease: Secondary | ICD-10-CM | POA: Diagnosis not present

## 2023-06-19 DIAGNOSIS — Z7984 Long term (current) use of oral hypoglycemic drugs: Secondary | ICD-10-CM | POA: Insufficient documentation

## 2023-06-19 DIAGNOSIS — J849 Interstitial pulmonary disease, unspecified: Secondary | ICD-10-CM | POA: Insufficient documentation

## 2023-06-19 DIAGNOSIS — R9089 Other abnormal findings on diagnostic imaging of central nervous system: Secondary | ICD-10-CM | POA: Diagnosis not present

## 2023-06-19 DIAGNOSIS — J9811 Atelectasis: Secondary | ICD-10-CM | POA: Diagnosis not present

## 2023-06-19 DIAGNOSIS — I2693 Single subsegmental pulmonary embolism without acute cor pulmonale: Secondary | ICD-10-CM | POA: Diagnosis not present

## 2023-06-19 DIAGNOSIS — K219 Gastro-esophageal reflux disease without esophagitis: Secondary | ICD-10-CM | POA: Insufficient documentation

## 2023-06-19 DIAGNOSIS — Z7901 Long term (current) use of anticoagulants: Secondary | ICD-10-CM | POA: Insufficient documentation

## 2023-06-19 DIAGNOSIS — N183 Chronic kidney disease, stage 3 unspecified: Secondary | ICD-10-CM | POA: Diagnosis not present

## 2023-06-19 DIAGNOSIS — I480 Paroxysmal atrial fibrillation: Secondary | ICD-10-CM | POA: Insufficient documentation

## 2023-06-19 DIAGNOSIS — Z853 Personal history of malignant neoplasm of breast: Secondary | ICD-10-CM | POA: Insufficient documentation

## 2023-06-19 DIAGNOSIS — J984 Other disorders of lung: Secondary | ICD-10-CM | POA: Diagnosis not present

## 2023-06-19 DIAGNOSIS — E1122 Type 2 diabetes mellitus with diabetic chronic kidney disease: Secondary | ICD-10-CM | POA: Diagnosis not present

## 2023-06-19 DIAGNOSIS — Z1152 Encounter for screening for COVID-19: Secondary | ICD-10-CM | POA: Insufficient documentation

## 2023-06-19 DIAGNOSIS — E039 Hypothyroidism, unspecified: Secondary | ICD-10-CM | POA: Diagnosis not present

## 2023-06-19 DIAGNOSIS — I517 Cardiomegaly: Secondary | ICD-10-CM | POA: Diagnosis not present

## 2023-06-19 DIAGNOSIS — R197 Diarrhea, unspecified: Secondary | ICD-10-CM | POA: Diagnosis not present

## 2023-06-19 DIAGNOSIS — Z043 Encounter for examination and observation following other accident: Secondary | ICD-10-CM | POA: Diagnosis not present

## 2023-06-19 DIAGNOSIS — S199XXA Unspecified injury of neck, initial encounter: Secondary | ICD-10-CM | POA: Diagnosis not present

## 2023-06-19 DIAGNOSIS — I2694 Multiple subsegmental pulmonary emboli without acute cor pulmonale: Secondary | ICD-10-CM

## 2023-06-19 DIAGNOSIS — S0990XA Unspecified injury of head, initial encounter: Secondary | ICD-10-CM | POA: Diagnosis not present

## 2023-06-19 DIAGNOSIS — I2510 Atherosclerotic heart disease of native coronary artery without angina pectoris: Secondary | ICD-10-CM | POA: Diagnosis not present

## 2023-06-19 DIAGNOSIS — Z9181 History of falling: Secondary | ICD-10-CM | POA: Diagnosis present

## 2023-06-19 LAB — ECHOCARDIOGRAM COMPLETE
AR max vel: 2.6 cm2
AV Area VTI: 2.84 cm2
AV Area mean vel: 2.81 cm2
AV Mean grad: 3 mmHg
AV Peak grad: 5.5 mmHg
Ao pk vel: 1.17 m/s
Area-P 1/2: 3.08 cm2
Calc EF: 67.4 %
Height: 67 in
S' Lateral: 2 cm
Single Plane A2C EF: 67.4 %
Single Plane A4C EF: 66.7 %
Weight: 2272 [oz_av]

## 2023-06-19 LAB — I-STAT CHEM 8, ED
BUN: 17 mg/dL (ref 8–23)
Calcium, Ion: 1.22 mmol/L (ref 1.15–1.40)
Chloride: 99 mmol/L (ref 98–111)
Creatinine, Ser: 1 mg/dL (ref 0.44–1.00)
Glucose, Bld: 179 mg/dL — ABNORMAL HIGH (ref 70–99)
HCT: 36 % (ref 36.0–46.0)
Hemoglobin: 12.2 g/dL (ref 12.0–15.0)
Potassium: 3.4 mmol/L — ABNORMAL LOW (ref 3.5–5.1)
Sodium: 134 mmol/L — ABNORMAL LOW (ref 135–145)
TCO2: 21 mmol/L — ABNORMAL LOW (ref 22–32)

## 2023-06-19 LAB — CBC WITH DIFFERENTIAL/PLATELET
Abs Immature Granulocytes: 0.08 10*3/uL — ABNORMAL HIGH (ref 0.00–0.07)
Basophils Absolute: 0 10*3/uL (ref 0.0–0.1)
Basophils Relative: 0 %
Eosinophils Absolute: 0 10*3/uL (ref 0.0–0.5)
Eosinophils Relative: 0 %
HCT: 31.9 % — ABNORMAL LOW (ref 36.0–46.0)
Hemoglobin: 10.6 g/dL — ABNORMAL LOW (ref 12.0–15.0)
Immature Granulocytes: 1 %
Lymphocytes Relative: 9 %
Lymphs Abs: 1.2 10*3/uL (ref 0.7–4.0)
MCH: 29 pg (ref 26.0–34.0)
MCHC: 33.2 g/dL (ref 30.0–36.0)
MCV: 87.2 fL (ref 80.0–100.0)
Monocytes Absolute: 0.8 10*3/uL (ref 0.1–1.0)
Monocytes Relative: 6 %
Neutro Abs: 10.6 10*3/uL — ABNORMAL HIGH (ref 1.7–7.7)
Neutrophils Relative %: 84 %
Platelets: 256 10*3/uL (ref 150–400)
RBC: 3.66 MIL/uL — ABNORMAL LOW (ref 3.87–5.11)
RDW: 13.7 % (ref 11.5–15.5)
WBC: 12.7 10*3/uL — ABNORMAL HIGH (ref 4.0–10.5)
nRBC: 0 % (ref 0.0–0.2)

## 2023-06-19 LAB — CBG MONITORING, ED: Glucose-Capillary: 101 mg/dL — ABNORMAL HIGH (ref 70–99)

## 2023-06-19 LAB — RESP PANEL BY RT-PCR (RSV, FLU A&B, COVID)  RVPGX2
Influenza A by PCR: NEGATIVE
Influenza B by PCR: NEGATIVE
Resp Syncytial Virus by PCR: NEGATIVE
SARS Coronavirus 2 by RT PCR: NEGATIVE

## 2023-06-19 LAB — APTT: aPTT: 28 s (ref 24–36)

## 2023-06-19 LAB — TROPONIN I (HIGH SENSITIVITY)
Troponin I (High Sensitivity): 14 ng/L (ref <18)
Troponin I (High Sensitivity): 12 ng/L (ref <18)

## 2023-06-19 LAB — MAGNESIUM: Magnesium: 1.5 mg/dL — ABNORMAL LOW (ref 1.7–2.4)

## 2023-06-19 LAB — TSH: TSH: 0.108 u[IU]/mL — ABNORMAL LOW (ref 0.350–4.500)

## 2023-06-19 LAB — HEPARIN LEVEL (UNFRACTIONATED): Heparin Unfractionated: >1.1 [IU]/mL — ABNORMAL HIGH (ref 0.30–0.70)

## 2023-06-19 LAB — GLUCOSE, CAPILLARY
Glucose-Capillary: 124 mg/dL — ABNORMAL HIGH (ref 70–99)
Glucose-Capillary: 120 mg/dL — ABNORMAL HIGH (ref 70–99)

## 2023-06-19 MED ORDER — APIXABAN 5 MG PO TABS
5.0000 mg | ORAL_TABLET | Freq: Two times a day (BID) | ORAL | Status: DC
  Administered 2023-06-19 – 2023-06-20 (×2): 5 mg via ORAL
  Filled 2023-06-19 (×2): qty 1

## 2023-06-19 MED ORDER — ONDANSETRON HCL 4 MG PO TABS: 4.0000 mg | ORAL_TABLET | Freq: Four times a day (QID) | ORAL | Status: DC | PRN

## 2023-06-19 MED ORDER — ONDANSETRON HCL 4 MG/2ML IJ SOLN: 4.0000 mg | Freq: Four times a day (QID) | INTRAMUSCULAR | Status: DC | PRN

## 2023-06-19 MED ORDER — ACETAMINOPHEN 650 MG RE SUPP: 650.0000 mg | Freq: Four times a day (QID) | RECTAL | Status: DC | PRN

## 2023-06-19 MED ORDER — INSULIN ASPART 100 UNIT/ML IJ SOLN
0.0000 [IU] | Freq: Three times a day (TID) | INTRAMUSCULAR | Status: DC
  Administered 2023-06-19 – 2023-06-20 (×3): 2 [IU] via SUBCUTANEOUS

## 2023-06-19 MED ORDER — MAGNESIUM SULFATE 2 GM/50ML IV SOLN
2.0000 g | Freq: Once | INTRAVENOUS | Status: AC
  Administered 2023-06-19: 2 g via INTRAVENOUS
  Filled 2023-06-19: qty 50

## 2023-06-19 MED ORDER — HEPARIN BOLUS VIA INFUSION
4000.0000 [IU] | Freq: Once | INTRAVENOUS | Status: AC
  Administered 2023-06-19: 4000 [IU] via INTRAVENOUS
  Filled 2023-06-19: qty 4000

## 2023-06-19 MED ORDER — PANTOPRAZOLE SODIUM 40 MG PO TBEC
40.0000 mg | DELAYED_RELEASE_TABLET | Freq: Every day | ORAL | Status: DC
  Administered 2023-06-19 – 2023-06-20 (×2): 40 mg via ORAL
  Filled 2023-06-19 (×2): qty 1

## 2023-06-19 MED ORDER — HEPARIN (PORCINE) 25000 UT/250ML-% IV SOLN
1150.0000 [IU]/h | INTRAVENOUS | Status: DC
  Administered 2023-06-19: 1150 [IU]/h via INTRAVENOUS
  Filled 2023-06-19: qty 250

## 2023-06-19 MED ORDER — PERFLUTREN LIPID MICROSPHERE
1.0000 mL | INTRAVENOUS | Status: AC | PRN
  Administered 2023-06-19: 2 mL via INTRAVENOUS

## 2023-06-19 MED ORDER — DILTIAZEM HCL ER COATED BEADS 120 MG PO CP24
120.0000 mg | ORAL_CAPSULE | Freq: Every day | ORAL | Status: DC
  Administered 2023-06-19 – 2023-06-20 (×2): 120 mg via ORAL
  Filled 2023-06-19 (×3): qty 1

## 2023-06-19 MED ORDER — POTASSIUM CHLORIDE 20 MEQ PO PACK
20.0000 meq | PACK | Freq: Two times a day (BID) | ORAL | Status: AC
  Administered 2023-06-19 (×2): 20 meq via ORAL
  Filled 2023-06-19 (×2): qty 1

## 2023-06-19 MED ORDER — HYDROXYCHLOROQUINE SULFATE 200 MG PO TABS
200.0000 mg | ORAL_TABLET | Freq: Every day | ORAL | Status: DC
  Administered 2023-06-19 – 2023-06-20 (×2): 200 mg via ORAL
  Filled 2023-06-19 (×3): qty 1

## 2023-06-19 MED ORDER — SPIRONOLACTONE 12.5 MG HALF TABLET
12.5000 mg | ORAL_TABLET | Freq: Every day | ORAL | Status: DC
  Administered 2023-06-19 – 2023-06-20 (×2): 12.5 mg via ORAL
  Filled 2023-06-19 (×2): qty 1

## 2023-06-19 MED ORDER — PREDNISONE 20 MG PO TABS
20.0000 mg | ORAL_TABLET | Freq: Every day | ORAL | Status: DC
  Administered 2023-06-20: 20 mg via ORAL
  Filled 2023-06-19: qty 1

## 2023-06-19 MED ORDER — GABAPENTIN 300 MG PO CAPS
300.0000 mg | ORAL_CAPSULE | Freq: Three times a day (TID) | ORAL | Status: DC
  Administered 2023-06-19 – 2023-06-20 (×3): 300 mg via ORAL
  Filled 2023-06-19 (×3): qty 1

## 2023-06-19 MED ORDER — AMOXICILLIN-POT CLAVULANATE 875-125 MG PO TABS
1.0000 | ORAL_TABLET | Freq: Two times a day (BID) | ORAL | Status: DC
  Administered 2023-06-19 – 2023-06-20 (×3): 1 via ORAL
  Filled 2023-06-19 (×4): qty 1

## 2023-06-19 MED ORDER — ACETAMINOPHEN 325 MG PO TABS
650.0000 mg | ORAL_TABLET | Freq: Four times a day (QID) | ORAL | Status: DC | PRN
  Administered 2023-06-19: 650 mg via ORAL
  Filled 2023-06-19: qty 2

## 2023-06-19 MED ORDER — INSULIN ASPART 100 UNIT/ML IJ SOLN: 0.0000 [IU] | Freq: Every day | INTRAMUSCULAR | Status: DC

## 2023-06-19 MED ORDER — LEVOTHYROXINE SODIUM 100 MCG PO TABS
100.0000 ug | ORAL_TABLET | Freq: Every day | ORAL | Status: DC
  Administered 2023-06-20: 100 ug via ORAL
  Filled 2023-06-19: qty 1

## 2023-06-19 MED ORDER — IOPAMIDOL (ISOVUE-370) INJECTION 76%
75.0000 mL | Freq: Once | INTRAVENOUS | Status: AC | PRN
  Administered 2023-06-19: 75 mL via INTRAVENOUS

## 2023-06-19 MED ORDER — ALBUTEROL SULFATE (2.5 MG/3ML) 0.083% IN NEBU: 3.0000 mL | INHALATION_SOLUTION | Freq: Four times a day (QID) | RESPIRATORY_TRACT | Status: DC | PRN

## 2023-06-19 NOTE — ED Provider Notes (Signed)
 Patient signed to me by Dr. Elby Green pending results of CT angio chest which was positive for PE.  Results were called in by radiology.  Will start patient on heparin  and admit to the hospitalist team.   Lind Repine, MD 06/19/23 [phone]

## 2023-06-19 NOTE — ED Notes (Signed)
 Patient transported to CT

## 2023-06-19 NOTE — Consult Note (Signed)
 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 who is presenting with acute on chronic dyspnea on exertion.  She has chronic mMRC 1 dyspnea.  Last night, her breathing got worse.  This is in context of having a fall, viral URI about a week ago with ongoing cough and yellow sputum production.  As part of her workup she had a CTA which showed possible segmental pulmonary embolism in left lower lobe but more concerning which showed an enlarging left lower lobe masslike consolidation.  Pulmonary and critical care medicine assist asked to comment on potential Eliquis  failure and what to do regarding the abnormal CT.  Patient is currently breathing nonlabored on room air with normal vitals.  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 rate 19, height 5\' 7"  (1.702 m), weight 64.4 kg, SpO2 98%.        Intake/Output Summary (Last 24 hours) at 06/19/2023 1325 Last data filed at 06/19/2023 1310 Gross per 24 hour  Intake 68.24 ml  Output --  Net 68.24 ml   Filed Weights   06/19/23 0306  Weight: 64.4 kg    Examination: General: No distress HENT: Mucous membranes moist, trachea midline Lungs: Lungs are diminished at bases, faint crackles, no accessory muscle  use Cardiovascular: Heart sounds are regular, extremities are warm Abdomen: Abdomen is soft, positive bowel sounds Extremities: No edema, mild arthritic changes Neuro: Moves to command, okay insight Skin: no rashes  Labs and imaging personally reviewed  Resolved problem list   Assessment and Plan  This is an 83 year old woman with a history of chewing tobacco use, IPF who is presenting with worsening dyspnea on exertion along with some infectious symptoms including productive cough with yellow sputum.  Imaging reveals a potential segmental left lower lobe pulmonary embolism in the area of a enlarging masslike consolidation that has been present for the past couple years.  Along with this mass growth is increasing adenopathy concerning for a primary lung malignancy with spread to the mediastinum.  There could certainly be a associated pulmonary embolus but I think this might be less likely given the location of the embolus and the associated lung architectural distortion.  I would not call this in Eliquis  failure as when I talked to the daughter the patient's compliance is questionable with this medication.  Her daughter would like for her to stay in the hospital this evening, daughter is coming in tomorrow to discuss the CT results as well as the plan as outlined below.  Discontinue heparin  drip, resume Eliquis  Check lower extremity duplex; if + would do 10 BID eliquis  x 1 week loading; if neg I would call the imaging finding more c/w artifact or potentially tumor clot within arterial lumen Treat for possible superimposed pneumonia with Augmentin  and short course of steroids  I will reach out to [name] and Dr. Baldwin Levee to figure out the timing of bronchoscopy in this patient. Should be fine for holding eliquis  for a few days before doing.  This can be done as an outpatient. Walking pulse ox prior to DC Will check on her tomorrow to assure improvement and to speak with daughter who will be  here in afternoon to hopefully take patient home.  Best Practice (right click and "Reselect all SmartList Selections" daily)  Per primary  Labs   CBC: Recent Labs  Lab 06/19/23 0338 06/19/23 0412  WBC  --  12.7*  NEUTROABS  --  10.6*  HGB 12.2 10.6*  HCT 36.0 31.9*  MCV  --  87.2  PLT  --  256    Basic Metabolic Panel: Recent Labs  Lab 06/19/23 0338 06/19/23 0526  NA 134*  --   K 3.4*  --   CL 99  --   GLUCOSE 179*  --   BUN 17  --   CREATININE 1.00  --   MG  --  1.5*   GFR: Estimated Creatinine Clearance: 42.9 mL/min (by C-G formula based on SCr of 1 mg/dL). Recent Labs  Lab 06/19/23 0412  WBC 12.7*    Liver Function Tests: No results for input(s): "AST", "ALT", "ALKPHOS", "BILITOT", "PROT", "ALBUMIN" in the last 168 hours. No results for input(s): "LIPASE", "AMYLASE" in the last 168 hours. No results for input(s): "AMMONIA" in the last 168 hours.  ABG    Component Value Date/Time   TCO2 21 (L) 06/19/2023 0338     Coagulation Profile: No results for input(s): "INR", "PROTIME" in the last 168 hours.  Cardiac Enzymes: No results for input(s): "CKTOTAL", "CKMB", "CKMBINDEX", "TROPONINI" in the last 168 hours.  HbA1C: Hgb A1c MFr Bld  Date/Time Value Ref Range Status  03/26/2023 01:45 PM 6.8 (H) 4.8 - 5.6 % Final    Comment:    (NOTE) Pre diabetes:          5.7%-6.4%  Diabetes:              >6.4%  Glycemic control for   <7.0% adults with diabetes   04/25/2022 10:11 PM 6.7 (H) 4.8 - 5.6 % Final    Comment:    (NOTE)         Prediabetes: 5.7 - 6.4         Diabetes: >6.4         Glycemic control for adults with diabetes: <7.0     CBG: Recent Labs  Lab 06/19/23 1124  GLUCAP 101*    Review of Systems:    Positive Symptoms in bold:  Constitutional fevers, chills, weight loss, fatigue, anorexia, malaise  Eyes decreased vision, double vision, eye irritation  Ears, Nose, Mouth, Throat sore throat, trouble swallowing, sinus congestion   Cardiovascular chest pain, paroxysmal nocturnal dyspnea, lower ext edema, palpitations   Respiratory SOB, cough, DOE, hemoptysis, wheezing  Gastrointestinal nausea, vomiting, diarrhea  Genitourinary burning with urination, trouble urinating  Musculoskeletal joint aches, joint swelling, back pain  Integumentary  rashes, skin lesions  Neurological focal weakness, focal numbness, trouble speaking, headaches  Psychiatric depression, anxiety, confusion  Endocrine polyuria, polydipsia, cold intolerance, heat intolerance  Hematologic abnormal bruising, abnormal bleeding, unexplained nose bleeds  Allergic/Immunologic recurrent infections, hives, swollen lymph nodes     Past Medical History:  She,  has a past medical history of Acute bronchitis (12/17/2021), Acute cystitis (02/20/2019), Arthritis, Atrial flutter (HCC), Breast cancer (HCC), Diabetes mellitus without  complication (HCC), Dyspnea, GERD (gastroesophageal reflux disease), Hypertension, Hypothyroidism, Libman Sacks endocarditis (HCC), Lupus, and Pneumonia (07/31/2021).   Surgical History:   Past Surgical History:  Procedure Laterality Date   BREAST BIOPSY Left 01/15/2023   US  LT BREAST BX W LOC DEV 1ST LESION IMG BX SPEC US  GUIDE 01/15/2023 [facility]   BREAST LUMPECTOMY Left 2009   IR 3D INDEPENDENT [facility]  05/29/2021   IR ANGIO INTRA EXTRACRAN SEL COM CAROTID INNOMINATE BILAT MOD SED  05/29/2021   IR ANGIO VERTEBRAL SEL VERTEBRAL BILAT MOD SED  05/29/2021   SIMPLE MASTECTOMY WITH AXILLARY SENTINEL NODE BIOPSY Left 03/31/2023   Procedure: LEFT MASTECTOMY;  Surgeon: Oza Blumenthal, MD;  Location: [facility] OR;  Service: General;  Laterality: Left;  LMA PEC BLOCK     Social History:   reports that she has never smoked. Her smokeless tobacco use includes chew. She reports that she does not drink alcohol  and does not use drugs.   Family History:  Her family history includes Asthma in her brother; Breast cancer (age of onset: 25)  in her daughter; Prostate cancer (age of onset: 58 - 69) in her father.   Allergies Allergies  Allergen Reactions   Ciprofloxacin Itching, Swelling and Other (See Comments)    Possibly causing tremors?   Crestor  [Rosuvastatin ] Other (See Comments)    Myalgia and back pain   Lisinopril  Cough   Ofev  [Nintedanib] Nausea Only     Home Medications  Prior to Admission medications   Medication Sig Start Date End Date Taking? Authorizing Provider  albuterol  (VENTOLIN  HFA) 108 (90 Base) MCG/ACT inhaler Inhale 1-2 puffs into the lungs every 6 (six) hours as needed for wheezing or shortness of breath. 06/10/23  Yes Reddick, Johnathan B, NP  anastrozole  (ARIMIDEX ) 1 MG tablet Take 1 tablet (1 mg total) by mouth daily. 04/08/23  Yes [name], [name], MD  apixaban  (ELIQUIS ) 5 MG TABS tablet TAKE 1 TABLET BY MOUTH 2 TIMES DAILY 04/09/23  Yes Fountain, Madison L, NP  diclofenac  Sodium (VOLTAREN ) 1 % GEL Apply 1 application. topically 4 (four) times daily as needed (pain).   Yes [provider]  diltiazem  (CARDIZEM  CD) 120 MG 24 hr capsule Take 1 capsule (120 mg total) by mouth daily. 01/07/23  Yes Dunn, Dayna N, PA-C  gabapentin  (NEURONTIN ) 300 MG capsule Take 300 mg by mouth 3 (three) times daily.   Yes [provider]  glimepiride  (AMARYL ) 1 MG tablet Take 1 mg by mouth daily with breakfast.   Yes [provider]  hydroxychloroquine  (PLAQUENIL ) 200 MG tablet Take 200 mg by mouth daily.   Yes [provider]  levothyroxine  (SYNTHROID ) 100 MCG tablet Take 100 mcg by mouth daily. 02/05/21  Yes [provider]  spironolactone  (ALDACTONE ) 25 MG tablet Take 0.5 tablets (12.5 mg total) by mouth daily. 01/07/23  Yes Dunn, Dayna N, PA-C  naproxen sodium (ALEVE) 220 MG tablet Take 220-440 mg by mouth 2 (two) times daily as needed (Pain).    [provider]  omeprazole (PRILOSEC) 20 MG capsule Take 20 mg by mouth daily as needed (Indigestion). 12/25/22   [provider]  promethazine -dextromethorphan  (PROMETHAZINE -DM) 6.25-15 MG/5ML syrup Take 5 mLs by mouth 4 (four) times daily as needed for cough. Patient not taking: Reported on 06/19/2023 06/10/23   Reddick, Johnathan B, NP  Tiotropium Bromide  Monohydrate (SPIRIVA  RESPIMAT) 1.25 MCG/ACT AERS Inhale 2 puffs into the lungs daily. Patient not taking: Reported on 09/15/2019 08/17/19 11/29/19  [name], MD  Critical care time: N/A

## 2023-06-19 NOTE — H&P (Addendum)
 History and Physical    [name] VHQ:[id number] DOB: 1940/07/29 DOA: 06/19/2023  PCP: [name], MD  Patient coming from: Home  I have personally briefly reviewed patient's old medical records in [facility]  Chief Complaint: Shortness of breath  HPI: [name] is a 83 y.o. female with medical history significant of hypertension, coronary artery disease, paroxysmal A-fib, interstitial lung disease, diabetes type 2, CKD stage III, lupus, hypothyroidism, left breast cancer s/p mastectomy on 03/31/2023 presented here with shortness of breath since yesterday.  Patient reports that she had a fall last week and had head injury and back injury.  She was feeling weak and dizzy and yesterday she started having shortness of breath with exertion.  No chest pain, palpitations, leg edema, prior history of pulmonary embolism, fever, chills, nausea, vomiting.  She lives with her daughter at home.  No history of tobacco abuse, alcohol  abuse, licit drug use.  ED Course: Upon arrival to ED: Temperature 100.2, HR: 85, PR: 26, BP 150/66, maintaining oxygen  saturation 98% on room air.  WBC 12.7, H&H 10.6/31.9, PLT: 256.  Sodium 134, K: 3.4, creatinine: 1.0.  Tested negative for COVID flu and RSV.  CT head and cervical spine showed no acute findings.  CT angiogram shows embolism.  Increased size of mediastinal and bilateral hilar lymph nodes suggesting reactive in etiology.  Enlarging masslike density within the left lower lobe which measures 3.1 x 3.3 cm.  Findings are concerning for enlarging neoplasm.  Recommend referral to pulmonary medicine or thoracic surgery for further management.  Patient was started on IV heparin  and hospitalist consulted for admission.  Review of Systems: As per HPI otherwise negative.    Past Medical History:  Diagnosis Date   Acute bronchitis 12/17/2021   Acute cystitis 02/20/2019   Arthritis    Atrial flutter (HCC)    Breast cancer (HCC)    left  2009 and 2025    Diabetes mellitus without complication (HCC)    Dyspnea    mostly with exertion   GERD (gastroesophageal reflux disease)    Hypertension    Hypothyroidism    Libman Sacks endocarditis (HCC)    Lupus    Pneumonia 07/31/2021    Past Surgical History:  Procedure Laterality Date   BREAST BIOPSY Left 01/15/2023   US  LT BREAST BX W LOC DEV 1ST LESION IMG BX SPEC US  GUIDE 01/15/2023 [facility]   BREAST LUMPECTOMY Left 2009   IR 3D INDEPENDENT [facility]KST  05/29/2021   IR ANGIO INTRA EXTRACRAN SEL COM CAROTID INNOMINATE BILAT MOD SED  05/29/2021   IR ANGIO VERTEBRAL SEL VERTEBRAL BILAT MOD SED  05/29/2021   SIMPLE MASTECTOMY WITH AXILLARY SENTINEL NODE BIOPSY Left 03/31/2023   Procedure: LEFT MASTECTOMY;  Surgeon: Oza Blumenthal, MD;  Location: [facility] OR;  Service: General;  Laterality: Left;  LMA PEC BLOCK     reports that she has never smoked. Her smokeless tobacco use includes chew. She reports that she does not drink alcohol  and does not use drugs.  Allergies  Allergen Reactions   Ciprofloxacin Itching, Swelling and Other (See Comments)    Possibly causing tremors?   Lisinopril  Cough   Crestor  [Rosuvastatin ] Other (See Comments)    Myalgia and back pain   Ofev  [Nintedanib] Nausea Only    Family History  Problem Relation Age of Onset   Prostate cancer Father 63 - 52       metaststic   Asthma Brother    Breast cancer Daughter 28  Prior to Admission medications   Medication Sig Start Date End Date Taking? Authorizing Provider  Accu-Chek Softclix Lancets lancets SMARTSIG:Topical 10/27/22   [provider]  albuterol  (VENTOLIN  HFA) 108 (90 Base) MCG/ACT inhaler Inhale 1-2 puffs into the lungs every 6 (six) hours as needed for wheezing or shortness of breath. 06/10/23   Reddick, Johnathan B, NP  anastrozole  (ARIMIDEX ) 1 MG tablet Take 1 tablet (1 mg total) by mouth daily. 04/08/23   [name], [name], MD  apixaban  (ELIQUIS ) 5 MG TABS tablet TAKE 1 TABLET BY MOUTH 2 TIMES  DAILY 04/09/23   Palmer Bobo L, NP  azithromycin  (ZITHROMAX ) 250 MG tablet Take by mouth. Patient not taking: Reported on 06/10/2023 03/02/23   [provider]  Blood Glucose Monitoring Suppl (ACCU-CHEK GUIDE) w/Device KIT USE TO CHECK BLOOD SUGAR AS DIRECTED BY PHYSICIAN. 10/27/22   [provider]  dextromethorphan -guaiFENesin  (MUCINEX  DM) 30-600 MG 12hr tablet Take 1 tablet by mouth 2 (two) times daily. Patient not taking: Reported on 06/10/2023 10/15/22   Regalado, Belkys A, MD  diclofenac  Sodium (VOLTAREN ) 1 % GEL Apply 1 application. topically 4 (four) times daily as needed (pain).    [provider]  diltiazem  (CARDIZEM  CD) 120 MG 24 hr capsule Take 1 capsule (120 mg total) by mouth daily. 01/07/23   Dunn, Dayna N, PA-C  gabapentin  (NEURONTIN ) 300 MG capsule Take 300 mg by mouth 3 (three) times daily.    [provider]  glimepiride  (AMARYL ) 1 MG tablet Take 1 mg by mouth daily with breakfast.    [provider]  glucose blood test strip USE TO CHECK BLOOD SUGAR ONCE DAILY ALTERNATING BETWEEN BEFORE BREAKFAST AND BEFORE DINNER 10/27/22   [provider]  hydroxychloroquine  (PLAQUENIL ) 200 MG tablet Take 200 mg by mouth daily.    [provider]  levothyroxine  (SYNTHROID ) 100 MCG tablet Take 100 mcg by mouth daily. 02/05/21   [provider]  naproxen sodium (ALEVE) 220 MG tablet Take 220-440 mg by mouth 2 (two) times daily as needed (Pain).    [provider]  omeprazole (PRILOSEC) 20 MG capsule Take 20 mg by mouth daily as needed (Indigestion). 12/25/22   [provider]  promethazine -dextromethorphan  (PROMETHAZINE -DM) 6.25-15 MG/5ML syrup Take 5 mLs by mouth 4 (four) times daily as needed for cough. 06/10/23   Reddick, Johnathan B, NP  spironolactone  (ALDACTONE ) 25 MG tablet Take 0.5 tablets (12.5 mg total) by mouth daily. 01/07/23   Dunn, Dayna N, PA-C  traMADol  (ULTRAM ) 50 MG tablet Take 1 tablet (50 mg  total) by mouth every 6 (six) hours as needed. Patient not taking: Reported on 06/10/2023 04/01/23   Oza Blumenthal, MD  Tiotropium Bromide  Monohydrate (SPIRIVA  RESPIMAT) 1.25 MCG/ACT AERS Inhale 2 puffs into the lungs daily. Patient not taking: Reported on 09/15/2019 08/17/19 11/29/19  [name], MD    Physical Exam: Vitals:   06/19/23 0745 06/19/23 0800 06/19/23 0815 06/19/23 0830  BP: (!) 115/59 120/83 128/66 (!) 120/57  Pulse: 83 92 84 85  Resp: (!) 27 19 (!) 30 (!) 27  Temp:      TempSrc:      SpO2: 97% 99% 99% 98%  Weight:      Height:        Constitutional: NAD, calm, comfortable, on room air, communicating well Eyes: PERRL, lids and conjunctivae normal ENMT: Mucous membranes are moist. Posterior pharynx clear of any exudate or lesions.Normal dentition.  Neck: normal, supple, no masses, no thyromegaly Respiratory: clear to auscultation bilaterally,  no wheezing, no crackles. Normal respiratory effort. No accessory muscle use.  Cardiovascular: Regular rate and rhythm, no murmurs / rubs / gallops. No extremity edema. 2+ pedal pulses. No carotid bruits.  Abdomen: no tenderness, no masses palpated. No hepatosplenomegaly. Bowel sounds positive.  Musculoskeletal: no clubbing / cyanosis. No joint deformity upper and lower extremities. Good ROM, no contractures. Normal muscle tone.  Skin: no rashes, lesions, ulcers. No induration Neurologic: CN 2-12 grossly intact. Sensation intact, DTR normal. Strength 5/5 in all 4.  Psychiatric: Normal judgment and insight. Alert and oriented x 3. Normal mood.    Labs on Admission: I have personally reviewed following labs and imaging studies  CBC: Recent Labs  Lab 06/19/23 0338 06/19/23 0412  WBC  --  12.7*  NEUTROABS  --  10.6*  HGB 12.2 10.6*  HCT 36.0 31.9*  MCV  --  87.2  PLT  --  256   Basic Metabolic Panel: Recent Labs  Lab 06/19/23 0338  NA 134*  K 3.4*  CL 99  GLUCOSE 179*  BUN 17  CREATININE 1.00    GFR: Estimated Creatinine Clearance: 42.9 mL/min (by C-G formula based on SCr of 1 mg/dL). Liver Function Tests: No results for input(s): "AST", "ALT", "ALKPHOS", "BILITOT", "PROT", "ALBUMIN" in the last 168 hours. No results for input(s): "LIPASE", "AMYLASE" in the last 168 hours. No results for input(s): "AMMONIA" in the last 168 hours. Coagulation Profile: No results for input(s): "INR", "PROTIME" in the last 168 hours. Cardiac Enzymes: No results for input(s): "CKTOTAL", "CKMB", "CKMBINDEX", "TROPONINI" in the last 168 hours. BNP (last 3 results) Recent Labs    10/29/22 1405  PROBNP 59.0   HbA1C: No results for input(s): "HGBA1C" in the last 72 hours. CBG: No results for input(s): "GLUCAP" in the last 168 hours. Lipid Profile: No results for input(s): "CHOL", "HDL", "LDLCALC", "TRIG", "CHOLHDL", "LDLDIRECT" in the last 72 hours. Thyroid  Function Tests: No results for input(s): "TSH", "T4TOTAL", "FREET4", "T3FREE", "THYROIDAB" in the last 72 hours. Anemia Panel: No results for input(s): "VITAMINB12", "FOLATE", "FERRITIN", "TIBC", "IRON", "RETICCTPCT" in the last 72 hours. Urine analysis:    Component Value Date/Time   COLORURINE YELLOW 10/11/2022 1608   APPEARANCEUR CLEAR 10/11/2022 1608   LABSPEC 1.010 10/11/2022 1608   PHURINE 6.0 10/11/2022 1608   GLUCOSEU NEGATIVE 10/11/2022 1608   HGBUR SMALL (A) 10/11/2022 1608   BILIRUBINUR NEGATIVE 10/11/2022 1608   BILIRUBINUR negative 04/25/2022 1221   KETONESUR NEGATIVE 10/11/2022 1608   PROTEINUR NEGATIVE 10/11/2022 1608   UROBILINOGEN 1.0 04/25/2022 1221   UROBILINOGEN 0.2 06/20/2014 1842   NITRITE POSITIVE (A) 10/11/2022 1608   LEUKOCYTESUR SMALL (A) 10/11/2022 1608    Radiological Exams on Admission: CT Angio Chest PE W and/or Wo Contrast Result Date: 06/19/2023 CLINICAL DATA:  Syncope/presyncope. Evaluate for pulmonary embolism. EXAM: CT ANGIOGRAPHY CHEST WITH CONTRAST TECHNIQUE: Multidetector CT imaging of the  chest was performed using the standard protocol during bolus administration of intravenous contrast. Multiplanar CT image reconstructions and MIPs were obtained to evaluate the vascular anatomy. RADIATION DOSE REDUCTION: This exam was performed according to the departmental dose-optimization program which includes automated exposure control, adjustment of the mA and/or kV according to patient size and/or use of iterative reconstruction technique. CONTRAST:  75mL ISOVUE-370 IOPAMIDOL (ISOVUE-370) INJECTION 76% COMPARISON:  10/12/2022 FINDINGS: Cardiovascular: Satisfactory opacification of the pulmonary arteries to the segmental level. There is a suspected focal filling defect within a segmental branch of the left lower lobe pulmonary artery, image 239/7. No additional signs of  acute pulmonary embolism identified. Aortic atherosclerosis. Multi vessel coronary artery calcifications. Mediastinum/Nodes: Increased size of mediastinal and bilateral hilar lymph nodes. Pre-vascular lymph node within the anterior mediastinum measures 0.9 cm. Previously 1 cm. Left hilar lymph node measures 1.1 cm, image 29/5. Previously 0.3 cm. Thyroid  gland, trachea, and esophagus are unremarkable. Lungs/Pleura: Signs of chronic interstitial lung disease identified with bilateral peripheral, subpleural interstitial reticulation and honeycombing with a lower lung zone predominance. These findings appear similar to the prior examination. Bilateral heterogeneous ground-glass attenuation is noted within both lower lobes with mild consolidative changes noted in the superior segment of left lower lobe. There is a masslike density within the left lower lobe which measures 3.1 x 3.3 cm, image 127/6. On the previous exam from 10/22/2022 this measured 2.8 x 2.1 cm. On the examination from 04/26/2022 this area measured 1.5 x 1.5 cm. Upper Abdomen: No acute abnormality. Musculoskeletal: No chest wall abnormality. No acute or significant osseous findings.  Review of the MIP images confirms the above findings. IMPRESSION: 1. Filling defect within segmental branch of the left lower lobe pulmonary artery is suspicious for pulmonary embolism. 2. Signs of chronic interstitial lung disease with bilateral peripheral, subpleural interstitial reticulation and honeycombing with a lower lung zone predominance. These findings appear similar to the prior examination. 3. Bilateral heterogeneous ground-glass attenuation is noted within both lower lobes with mild consolidative changes noted in the superior segment of left lower lobe. Although nonspecific these findings may reflect superimposed inflammation or infection versus progressive changes of interstitial lung disease. 4. Increased size of mediastinal and bilateral hilar lymph nodes. These are favored to be reactive in etiology. 5. Enlarging masslike density within the left lower lobe which measures 3.1 x 3.3 cm. On the previous exam from 10/22/2022 this measured 2.8 x 2.1 cm. On the examination from 04/26/2022 this area measured 1.5 x 1.5 cm. Findings are concerning for enlarging neoplasm. Recommend referral to pulmonary medicine or thoracic surgery for further management. 6. Coronary artery calcifications. 7.  Aortic Atherosclerosis (ICD10-I70.0). Critical Value/emergent results were called by telephone at the time of interpretation on 06/19/2023 at 8:34 am to provider Davene Ernst, who verbally acknowledged these results. Choose 1 Electronically Signed   By: Kimberley Penman M.D.   On: 06/19/2023 08:34   CT Head Wo Contrast Result Date: 06/19/2023 CLINICAL DATA:  Blunt poly trauma due to fall. EXAM: CT HEAD WITHOUT CONTRAST CT CERVICAL SPINE WITHOUT CONTRAST TECHNIQUE: Multidetector CT imaging of the head and cervical spine was performed following the standard protocol without intravenous contrast. Multiplanar CT image reconstructions of the cervical spine were also generated. RADIATION DOSE REDUCTION: This exam was performed  according to the departmental dose-optimization program which includes automated exposure control, adjustment of the mA and/or kV according to patient size and/or use of iterative reconstruction technique. COMPARISON:  04/26/2022 head CT FINDINGS: CT HEAD FINDINGS Brain: No evidence of acute infarction, hemorrhage, hydrocephalus, extra-axial collection or mass lesion/mass effect. Cerebral volume loss in keeping with age Vascular: No hyperdense vessel or unexpected calcification. Skull: Normal. Negative for fracture or focal lesion. Sinuses/Orbits: No acute finding. CT CERVICAL SPINE FINDINGS Alignment: No traumatic malalignment Skull base and vertebrae: No acute fracture. No primary bone lesion or focal pathologic process. Soft tissues and spinal canal: No prevertebral fluid or swelling. No visible canal hematoma. Disc levels: Generalized degenerative endplate spurring with underlying protrusions, especially bulky endplate spurring at C3-4 to C6-7. Upper chest: Negative IMPRESSION: No evidence of acute intracranial or cervical spine injury. Electronically Signed  By: Ronnette Coke M.D.   On: 06/19/2023 05:28   CT Cervical Spine Wo Contrast Result Date: 06/19/2023 CLINICAL DATA:  Blunt poly trauma due to fall. EXAM: CT HEAD WITHOUT CONTRAST CT CERVICAL SPINE WITHOUT CONTRAST TECHNIQUE: Multidetector CT imaging of the head and cervical spine was performed following the standard protocol without intravenous contrast. Multiplanar CT image reconstructions of the cervical spine were also generated. RADIATION DOSE REDUCTION: This exam was performed according to the departmental dose-optimization program which includes automated exposure control, adjustment of the mA and/or kV according to patient size and/or use of iterative reconstruction technique. COMPARISON:  04/26/2022 head CT FINDINGS: CT HEAD FINDINGS Brain: No evidence of acute infarction, hemorrhage, hydrocephalus, extra-axial collection or mass lesion/mass  effect. Cerebral volume loss in keeping with age Vascular: No hyperdense vessel or unexpected calcification. Skull: Normal. Negative for fracture or focal lesion. Sinuses/Orbits: No acute finding. CT CERVICAL SPINE FINDINGS Alignment: No traumatic malalignment Skull base and vertebrae: No acute fracture. No primary bone lesion or focal pathologic process. Soft tissues and spinal canal: No prevertebral fluid or swelling. No visible canal hematoma. Disc levels: Generalized degenerative endplate spurring with underlying protrusions, especially bulky endplate spurring at C3-4 to C6-7. Upper chest: Negative IMPRESSION: No evidence of acute intracranial or cervical spine injury. Electronically Signed   By: Ronnette Coke M.D.   On: 06/19/2023 05:28   DG Chest Portable 1 View Result Date: 06/19/2023 CLINICAL DATA:  Status post fall 2 days ago. EXAM: PORTABLE CHEST 1 VIEW COMPARISON:  October 11, 2022 FINDINGS: The cardiac silhouette is mildly enlarged and unchanged in size. Mild atelectatic changes are seen within the bilateral lung bases. No pleural effusion or pneumothorax is identified. A cluster of numerous radiopaque pellet like foreign bodies and adjacent larger, round, less radiopaque foreign bodies are seen overlying the left lung base. Degenerative changes seen throughout the thoracic spine. IMPRESSION: 1. Stable cardiomegaly with mild bibasilar atelectasis. 2. Multiple radiopaque foreign bodies overlying the left lung base. This may be postoperative in origin. Correlation with physical examination and the patient's surgical history is recommended Electronically Signed   By: Virgle Grime M.D.   On: 06/19/2023 03:50    EKG: Independently reviewed.  Normal sinus rhythm.  No acute ST-T wave changes noted.  Assessment/Plan  Acute pulmonary embolism: -History of breast cancer and on anastrozole . - Start on IV heparin .  Patient is on Eliquis  at home and ?  Compliance - Patient is hemodynamically  stable.  On room air.   -Ordered echocardiogram and Doppler ultrasound - Monitor H&H closely  Left lower lung mass: - Reviewed CT angio chest showed enlarging masslike density within the left lower lobe which measures 3.1 x 3.3 cm finding concerning for enlarging neoplasm. - Consulted PCCM.  Status post fall: - CT head and cervical spine negative for any acute findings - Consult PT  Paroxysmal atrial flutter: Followed by cardiology outpatient.- -Continue home medications-Cardizem .  Continue IV heparin .  Hypokalemia: Replenished - Repeat BMP tomorrow a.m.  Left breast cancer status postmastectomy - Per Dr. Lucienne Ryder on 03/31/2023. - Hold on anastrozole .  Type 2 diabetes mellitus without long-term current use of insulin : - Last A1c 6.8 checked on 03/26/2023.  Start sliding scale insulin  - Continue gabapentin  for neuropathy.  Hold glimepiride .  Interstitial lung disease Lupus -Continue Plaquenil  - Followed by rheumatology outpatient  Hypothyroidism - Check TSH.  Continue levothyroxine   Hypertension: Continue Cardizem  and spironolactone .  GERD: Continue PPI  Hypomagnesemia: Replenished.  Repeat magnesium  level tomorrow a.m.  Addendum:  Discussed via secure chat with Dr. Felipe Horton.  Plan is switch from IV heparin  to Eliquis .  Outpatient follow-up with PCCM for further workup on lung mass.  Likely discharge tomorrow a.m.   DVT prophylaxis: Full dose heparin  Code Status: Full code-with the patient Family Communication: None present at bedside.  Plan of care discussed with patient in length and she verbalized understanding and agreed with it.  I tried to call patient's daughter twice with no response.  Unable to leave voice message since mailbox was full  I discussed patient's daughter at bedside plan of care at 11:53 AM  Disposition Plan: Likely home Consults called: PCCM Admission status: observation   Elester Grim MD [facility]  If 7PM-7AM, please contact  night-coverage [email or web address]  06/19/2023, 8:59 AM

## 2023-06-19 NOTE — ED Provider Notes (Signed)
 [place] EMERGENCY DEPARTMENT AT [facility] Provider Note   CSN: [id number] Arrival date & time: 06/19/23  0304     History  Chief Complaint  Patient presents with   Kathryn Faes SHIFRA [name] is a 83 y.o. female.  The history is provided by the patient.  Fall This is a new problem. The current episode started yesterday. The problem occurs rarely. The problem has been resolved. Associated symptoms include shortness of breath. Pertinent negatives include no chest pain, no abdominal pain and no headaches. Nothing aggravates the symptoms. Nothing relieves the symptoms. She has tried nothing for the symptoms. The treatment provided no relief.  Patient on Eliquis  who fell striking head one day ago and feels anxious and SOB since the fall. No CP.      Home Medications Prior to Admission medications   Medication Sig Start Date End Date Taking? Authorizing Provider  Accu-Chek Softclix Lancets lancets SMARTSIG:Topical 10/27/22   [provider]  albuterol  (VENTOLIN  HFA) 108 (90 Base) MCG/ACT inhaler Inhale 1-2 puffs into the lungs every 6 (six) hours as needed for wheezing or shortness of breath. 06/10/23   Reddick, Johnathan B, NP  anastrozole  (ARIMIDEX ) 1 MG tablet Take 1 tablet (1 mg total) by mouth daily. 04/08/23   [name], [name], MD  apixaban  (ELIQUIS ) 5 MG TABS tablet TAKE 1 TABLET BY MOUTH 2 TIMES DAILY 04/09/23   Palmer Bobo L, NP  azithromycin  (ZITHROMAX ) 250 MG tablet Take by mouth. Patient not taking: Reported on 06/10/2023 03/02/23   [provider]  Blood Glucose Monitoring Suppl (ACCU-CHEK GUIDE) w/Device KIT USE TO CHECK BLOOD SUGAR AS DIRECTED BY PHYSICIAN. 10/27/22   [provider]  dextromethorphan -guaiFENesin  (MUCINEX  DM) 30-600 MG 12hr tablet Take 1 tablet by mouth 2 (two) times daily. Patient not taking: Reported on 06/10/2023 10/15/22   Regalado, Belkys A, MD  diclofenac  Sodium (VOLTAREN ) 1 % GEL Apply 1 application. topically 4  (four) times daily as needed (pain).    [provider]  diltiazem  (CARDIZEM  CD) 120 MG 24 hr capsule Take 1 capsule (120 mg total) by mouth daily. 01/07/23   Dunn, Dayna N, PA-C  gabapentin  (NEURONTIN ) 300 MG capsule Take 300 mg by mouth 3 (three) times daily.    [provider]  glimepiride  (AMARYL ) 1 MG tablet Take 1 mg by mouth daily with breakfast.    [provider]  glucose blood test strip USE TO CHECK BLOOD SUGAR ONCE DAILY ALTERNATING BETWEEN BEFORE BREAKFAST AND BEFORE DINNER 10/27/22   [provider]  hydroxychloroquine  (PLAQUENIL ) 200 MG tablet Take 200 mg by mouth daily.    [provider]  levothyroxine  (SYNTHROID ) 100 MCG tablet Take 100 mcg by mouth daily. 02/05/21   [provider]  naproxen sodium (ALEVE) 220 MG tablet Take 220-440 mg by mouth 2 (two) times daily as needed (Pain).    [provider]  omeprazole (PRILOSEC) 20 MG capsule Take 20 mg by mouth daily as needed (Indigestion). 12/25/22   [provider]  promethazine -dextromethorphan  (PROMETHAZINE -DM) 6.25-15 MG/5ML syrup Take 5 mLs by mouth 4 (four) times daily as needed for cough. 06/10/23   Reddick, Johnathan B, NP  spironolactone  (ALDACTONE ) 25 MG tablet Take 0.5 tablets (12.5 mg total) by mouth daily. 01/07/23   Dunn, Dayna N, PA-C  traMADol  (ULTRAM ) 50 MG tablet Take 1 tablet (50 mg total) by mouth every 6 (six) hours as needed. Patient not taking: Reported on 06/10/2023 04/01/23   Oza Blumenthal, MD  Tiotropium Bromide  Monohydrate (SPIRIVA  RESPIMAT) 1.25 MCG/ACT AERS Inhale 2 puffs into the lungs daily. Patient not taking: Reported on 09/15/2019 08/17/19 11/29/19  [name], MD      Allergies    Ciprofloxacin, Lisinopril , Crestor  [rosuvastatin ], and Ofev  [nintedanib]    Review of Systems   Review of Systems  Constitutional:  Negative for fever.  HENT:  Negative for facial swelling.   Respiratory:  Positive for shortness of breath.    Cardiovascular:  Negative for chest pain.  Gastrointestinal:  Negative for abdominal pain.  Neurological:  Negative for headaches.  All other systems reviewed and are negative.   Physical Exam Updated Vital Signs BP 138/71   Pulse 87   Temp 100.2 F (37.9 C) (Oral)   Resp (!) 22   Ht 5\' 7"  (1.702 m)   Wt 64.4 kg   SpO2 99%   BMI 22.24 kg/m  Physical Exam Vitals and nursing note reviewed.  Constitutional:      General: She is not in acute distress.    Appearance: Normal appearance. She is well-developed.  HENT:     Head: Normocephalic and atraumatic.     Nose: Nose normal.  Eyes:     Pupils: Pupils are equal, round, and reactive to light.  Cardiovascular:     Rate and Rhythm: Normal rate and regular rhythm.     Pulses: Normal pulses.     Heart sounds: Normal heart sounds.  Pulmonary:     Effort: Pulmonary effort is normal. No respiratory distress.     Breath sounds: Normal breath sounds.  Abdominal:     General: Bowel sounds are normal. There is no distension.     Palpations: Abdomen is soft.     Tenderness: There is no abdominal tenderness. There is no guarding or rebound.  Musculoskeletal:        General: Normal range of motion.     Cervical back: Normal range of motion and neck supple.     Right lower leg: No edema.     Left lower leg: No edema.  Skin:    General: Skin is warm and dry.     Capillary Refill: Capillary refill takes less than 2 seconds.     Findings: No erythema or rash.  Neurological:     General: No focal deficit present.     Mental Status: She is alert.     Deep Tendon Reflexes: Reflexes normal.  Psychiatric:        Thought Content: Thought content normal.     ED Results / Procedures / Treatments   Labs (all labs ordered are listed, but only abnormal results are displayed) Results for orders placed or performed during the hospital encounter of 06/19/23  I-stat chem 8, ED (not at [facility], [facility] or [facility])   Collection Time: 06/19/23  3:38 AM   Result Value Ref Range   Sodium 134 (L) 135 - 145 mmol/L   Potassium 3.4 (L) 3.5 - 5.1 mmol/L   Chloride 99 98 - 111 mmol/L   BUN 17 8 - 23 mg/dL   Creatinine, Ser 6.21 0.44 - 1.00 mg/dL   Glucose, Bld 308 (H) 70 - 99 mg/dL   Calcium , Ion 1.22 1.15 - 1.40 mmol/L   TCO2 21 (L) 22 - 32 mmol/L   Hemoglobin 12.2 12.0 - 15.0 g/dL   HCT 65.7 84.6 - 96.2 %  Resp panel by RT-PCR (RSV, Flu A&B, Covid) Anterior Nasal Swab   Collection Time: 06/19/23  4:12 AM   Specimen:  Anterior Nasal Swab  Result Value Ref Range   SARS Coronavirus 2 by RT PCR NEGATIVE NEGATIVE   Influenza A by PCR NEGATIVE NEGATIVE   Influenza B by PCR NEGATIVE NEGATIVE   Resp Syncytial Virus by PCR NEGATIVE NEGATIVE  CBC with Differential   Collection Time: 06/19/23  4:12 AM  Result Value Ref Range   WBC 12.7 (H) 4.0 - 10.5 K/uL   RBC 3.66 (L) 3.87 - 5.11 MIL/uL   Hemoglobin 10.6 (L) 12.0 - 15.0 g/dL   HCT 16.1 (L) 09.6 - 04.5 %   MCV 87.2 80.0 - 100.0 fL   MCH 29.0 26.0 - 34.0 pg   MCHC 33.2 30.0 - 36.0 g/dL   RDW 40.9 81.1 - 91.4 %   Platelets 256 150 - 400 K/uL   nRBC 0.0 0.0 - 0.2 %   Neutrophils Relative % 84 %   Neutro Abs 10.6 (H) 1.7 - 7.7 K/uL   Lymphocytes Relative 9 %   Lymphs Abs 1.2 0.7 - 4.0 K/uL   Monocytes Relative 6 %   Monocytes Absolute 0.8 0.1 - 1.0 K/uL   Eosinophils Relative 0 %   Eosinophils Absolute 0.0 0.0 - 0.5 K/uL   Basophils Relative 0 %   Basophils Absolute 0.0 0.0 - 0.1 K/uL   Immature Granulocytes 1 %   Abs Immature Granulocytes 0.08 (H) 0.00 - 0.07 K/uL  Troponin I (High Sensitivity)   Collection Time: 06/19/23  4:12 AM  Result Value Ref Range   Troponin I (High Sensitivity) 14 <18 ng/L   CT Head Wo Contrast Result Date: 06/19/2023 CLINICAL DATA:  Blunt poly trauma due to fall. EXAM: CT HEAD WITHOUT CONTRAST CT CERVICAL SPINE WITHOUT CONTRAST TECHNIQUE: Multidetector CT imaging of the head and cervical spine was performed following the standard protocol without intravenous  contrast. Multiplanar CT image reconstructions of the cervical spine were also generated. RADIATION DOSE REDUCTION: This exam was performed according to the departmental dose-optimization program which includes automated exposure control, adjustment of the mA and/or kV according to patient size and/or use of iterative reconstruction technique. COMPARISON:  04/26/2022 head CT FINDINGS: CT HEAD FINDINGS Brain: No evidence of acute infarction, hemorrhage, hydrocephalus, extra-axial collection or mass lesion/mass effect. Cerebral volume loss in keeping with age Vascular: No hyperdense vessel or unexpected calcification. Skull: Normal. Negative for fracture or focal lesion. Sinuses/Orbits: No acute finding. CT CERVICAL SPINE FINDINGS Alignment: No traumatic malalignment Skull base and vertebrae: No acute fracture. No primary bone lesion or focal pathologic process. Soft tissues and spinal canal: No prevertebral fluid or swelling. No visible canal hematoma. Disc levels: Generalized degenerative endplate spurring with underlying protrusions, especially bulky endplate spurring at C3-4 to C6-7. Upper chest: Negative IMPRESSION: No evidence of acute intracranial or cervical spine injury. Electronically Signed   By: Ronnette Coke M.D.   On: 06/19/2023 05:28   CT Cervical Spine Wo Contrast Result Date: 06/19/2023 CLINICAL DATA:  Blunt poly trauma due to fall. EXAM: CT HEAD WITHOUT CONTRAST CT CERVICAL SPINE WITHOUT CONTRAST TECHNIQUE: Multidetector CT imaging of the head and cervical spine was performed following the standard protocol without intravenous contrast. Multiplanar CT image reconstructions of the cervical spine were also generated. RADIATION DOSE REDUCTION: This exam was performed according to the departmental dose-optimization program which includes automated exposure control, adjustment of the mA and/or kV according to patient size and/or use of iterative reconstruction technique. COMPARISON:  04/26/2022 head  CT FINDINGS: CT HEAD FINDINGS Brain: No evidence of acute infarction, hemorrhage, hydrocephalus, extra-axial  collection or mass lesion/mass effect. Cerebral volume loss in keeping with age Vascular: No hyperdense vessel or unexpected calcification. Skull: Normal. Negative for fracture or focal lesion. Sinuses/Orbits: No acute finding. CT CERVICAL SPINE FINDINGS Alignment: No traumatic malalignment Skull base and vertebrae: No acute fracture. No primary bone lesion or focal pathologic process. Soft tissues and spinal canal: No prevertebral fluid or swelling. No visible canal hematoma. Disc levels: Generalized degenerative endplate spurring with underlying protrusions, especially bulky endplate spurring at C3-4 to C6-7. Upper chest: Negative IMPRESSION: No evidence of acute intracranial or cervical spine injury. Electronically Signed   By: Ronnette Coke M.D.   On: 06/19/2023 05:28   DG Chest Portable 1 View Result Date: 06/19/2023 CLINICAL DATA:  Status post fall 2 days ago. EXAM: PORTABLE CHEST 1 VIEW COMPARISON:  October 11, 2022 FINDINGS: The cardiac silhouette is mildly enlarged and unchanged in size. Mild atelectatic changes are seen within the bilateral lung bases. No pleural effusion or pneumothorax is identified. A cluster of numerous radiopaque pellet like foreign bodies and adjacent larger, round, less radiopaque foreign bodies are seen overlying the left lung base. Degenerative changes seen throughout the thoracic spine. IMPRESSION: 1. Stable cardiomegaly with mild bibasilar atelectasis. 2. Multiple radiopaque foreign bodies overlying the left lung base. This may be postoperative in origin. Correlation with physical examination and the patient's surgical history is recommended Electronically Signed   By: Virgle Grime M.D.   On: 06/19/2023 03:50    EKG  EKG Interpretation Date/Time:  Saturday Jun 19 2023 06:39:46 EDT Ventricular Rate:  88 PR Interval:  162 QRS Duration:  94 QT  Interval:  344 QTC Calculation: 417 R Axis:   -24  Text Interpretation: Sinus rhythm Borderline left axis deviation Low voltage, precordial leads Confirmed by Maralee Senate, Kaycen Whitworth ([id number]) on 06/19/2023 6:42:08 AM         Radiology CT Head Wo Contrast Result Date: 06/19/2023 CLINICAL DATA:  Blunt poly trauma due to fall. EXAM: CT HEAD WITHOUT CONTRAST CT CERVICAL SPINE WITHOUT CONTRAST TECHNIQUE: Multidetector CT imaging of the head and cervical spine was performed following the standard protocol without intravenous contrast. Multiplanar CT image reconstructions of the cervical spine were also generated. RADIATION DOSE REDUCTION: This exam was performed according to the departmental dose-optimization program which includes automated exposure control, adjustment of the mA and/or kV according to patient size and/or use of iterative reconstruction technique. COMPARISON:  04/26/2022 head CT FINDINGS: CT HEAD FINDINGS Brain: No evidence of acute infarction, hemorrhage, hydrocephalus, extra-axial collection or mass lesion/mass effect. Cerebral volume loss in keeping with age Vascular: No hyperdense vessel or unexpected calcification. Skull: Normal. Negative for fracture or focal lesion. Sinuses/Orbits: No acute finding. CT CERVICAL SPINE FINDINGS Alignment: No traumatic malalignment Skull base and vertebrae: No acute fracture. No primary bone lesion or focal pathologic process. Soft tissues and spinal canal: No prevertebral fluid or swelling. No visible canal hematoma. Disc levels: Generalized degenerative endplate spurring with underlying protrusions, especially bulky endplate spurring at C3-4 to C6-7. Upper chest: Negative IMPRESSION: No evidence of acute intracranial or cervical spine injury. Electronically Signed   By: Ronnette Coke M.D.   On: 06/19/2023 05:28   CT Cervical Spine Wo Contrast Result Date: 06/19/2023 CLINICAL DATA:  Blunt poly trauma due to fall. EXAM: CT HEAD WITHOUT CONTRAST CT CERVICAL  SPINE WITHOUT CONTRAST TECHNIQUE: Multidetector CT imaging of the head and cervical spine was performed following the standard protocol without intravenous contrast. Multiplanar CT image reconstructions of the cervical spine were also generated.  RADIATION DOSE REDUCTION: This exam was performed according to the departmental dose-optimization program which includes automated exposure control, adjustment of the mA and/or kV according to patient size and/or use of iterative reconstruction technique. COMPARISON:  04/26/2022 head CT FINDINGS: CT HEAD FINDINGS Brain: No evidence of acute infarction, hemorrhage, hydrocephalus, extra-axial collection or mass lesion/mass effect. Cerebral volume loss in keeping with age Vascular: No hyperdense vessel or unexpected calcification. Skull: Normal. Negative for fracture or focal lesion. Sinuses/Orbits: No acute finding. CT CERVICAL SPINE FINDINGS Alignment: No traumatic malalignment Skull base and vertebrae: No acute fracture. No primary bone lesion or focal pathologic process. Soft tissues and spinal canal: No prevertebral fluid or swelling. No visible canal hematoma. Disc levels: Generalized degenerative endplate spurring with underlying protrusions, especially bulky endplate spurring at C3-4 to C6-7. Upper chest: Negative IMPRESSION: No evidence of acute intracranial or cervical spine injury. Electronically Signed   By: Ronnette Coke M.D.   On: 06/19/2023 05:28   DG Chest Portable 1 View Result Date: 06/19/2023 CLINICAL DATA:  Status post fall 2 days ago. EXAM: PORTABLE CHEST 1 VIEW COMPARISON:  October 11, 2022 FINDINGS: The cardiac silhouette is mildly enlarged and unchanged in size. Mild atelectatic changes are seen within the bilateral lung bases. No pleural effusion or pneumothorax is identified. A cluster of numerous radiopaque pellet like foreign bodies and adjacent larger, round, less radiopaque foreign bodies are seen overlying the left lung base. Degenerative  changes seen throughout the thoracic spine. IMPRESSION: 1. Stable cardiomegaly with mild bibasilar atelectasis. 2. Multiple radiopaque foreign bodies overlying the left lung base. This may be postoperative in origin. Correlation with physical examination and the patient's surgical history is recommended Electronically Signed   By: Virgle Grime M.D.   On: 06/19/2023 03:50    Procedures Procedures    Medications Ordered in ED Medications - No data to display  ED Course/ Medical Decision Making/ A&P                                 Medical Decision Making Patient who fell and hit head on DOAC, has been SOb since.   Amount and/or Complexity of Data Reviewed Independent Historian: EMS    Details: See above  External Data Reviewed: notes.    Details: Previous notes reviewed  Labs: ordered.    Details: Negative covid and flu.  Negative troponin 14, troponin slight elevation 12.7, hemoglobin slight low 10.6, normal platelets.  Sodium slight low 134, potassium 3.4, normal creatinine 1.00  Radiology: ordered and independent interpretation performed.    Details: No PNA on CXR ECG/medicine tests: ordered and independent interpretation performed. Decision-making details documented in ED Course.  Risk Risk Details: Patient is well appearing with normal exam and oxygen  saturation.  Awaiting CTA chest     Final Clinical Impression(s) / ED Diagnoses Final diagnoses:  None   Signed out Dr. Leighton Punches pending CTA Rx / DC Orders ED Discharge Orders     None         Lorenza Winkleman, MD 06/19/23 [phone]

## 2023-06-19 NOTE — Progress Notes (Addendum)
 ANTICOAGULATION CONSULT NOTE  Pharmacy Consult for Heparin  Indication: pulmonary embolus  Allergies  Allergen Reactions   Ciprofloxacin Itching, Swelling and Other (See Comments)    Possibly causing tremors?   Lisinopril  Cough   Crestor  [Rosuvastatin ] Other (See Comments)    Myalgia and back pain   Ofev  [Nintedanib] Nausea Only    Patient Measurements: Height: 5\' 7"  (170.2 cm) Weight: 64.4 kg (142 lb) IBW/kg (Calculated) : 61.6 Heparin  Dosing Weight: 64.4 kg  Vital Signs: Temp: 99.3 F (37.4 C) (05/24 0719) Temp Source: Oral (05/24 0719) BP: 127/65 (05/24 0900) Pulse Rate: 83 (05/24 0900)  Labs: Recent Labs    06/19/23 0338 06/19/23 0412 06/19/23 0526  HGB 12.2 10.6*  --   HCT 36.0 31.9*  --   PLT  --  256  --   CREATININE 1.00  --   --   TROPONINIHS  --  14 12    Estimated Creatinine Clearance: 42.9 mL/min (by C-G formula based on SCr of 1 mg/dL).   Medical History: Past Medical History:  Diagnosis Date   Acute bronchitis 12/17/2021   Acute cystitis 02/20/2019   Arthritis    Atrial flutter (HCC)    Breast cancer (HCC)    left  2009 and 2025   Diabetes mellitus without complication (HCC)    Dyspnea    mostly with exertion   GERD (gastroesophageal reflux disease)    Hypertension    Hypothyroidism    Libman Sacks endocarditis (HCC)    Lupus    Pneumonia 07/31/2021    Medications:  (Not in a hospital admission)  Scheduled:   insulin  aspart  0-15 Units Subcutaneous TID WC   insulin  aspart  0-5 Units Subcutaneous QHS   potassium chloride  20 mEq Oral BID   Infusions:  PRN: acetaminophen  **OR** acetaminophen , ondansetron  **OR** ondansetron  (ZOFRAN ) IV  Assessment: 42 yof with a history of HTN, CAD, AF on eliquis , ILD, T2DM, CKD, lupus, hypothyroidism, left breast cancer s/p mastectomy (3/25). Patient is presenting with SOB x 1 day. Heparin  per pharmacy consult placed for pulmonary embolus.  CTA PE w/ evidence for PE CT Head and spinal imaging  w/ No evidence of acute intracranial or cervical spine injury   Patient is on apixaban  prior to arrival. Last dose 5/23 am per patient. Will require aPTT monitoring due to likely falsely high anti-Xa level secondary to DOAC use.  Hgb 10.6; plt 256  Goal of Therapy:  Heparin  level 0.3-0.7 units/ml aPTT 66-102 seconds Monitor platelets by anticoagulation protocol: Yes   Plan:  Will give conservative IV bolus of 4000 units as last eliquis  dose ~ 24hrs ago Start heparin  infusion at 1150 units/hr Check aPTT & anti-Xa level in 8 hours and daily while on heparin  Continue to monitor via aPTT until levels are correlated Continue to monitor H&H and platelets  Dionicio Fray, PharmD, BCPS 06/19/2023 9:41 AM ED Clinical Pharmacist -  [phone]

## 2023-06-19 NOTE — Progress Notes (Signed)
 Echocardiogram 2D Echocardiogram has been performed.  Charletta Voight N Austine Wiedeman,RDCS 06/19/2023, 12:16 PM

## 2023-06-19 NOTE — ED Notes (Signed)
 Called  5 M

## 2023-06-19 NOTE — H&P (View-Only) (Signed)
 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 who is presenting with acute on chronic dyspnea on exertion.  She has chronic mMRC 1 dyspnea.  Last night, her breathing got worse.  This is in context of having a fall, viral URI about a week ago with ongoing cough and yellow sputum production.  As part of her workup she had a CTA which showed possible segmental pulmonary embolism in left lower lobe but more concerning which showed an enlarging left lower lobe masslike consolidation.  Pulmonary and critical care medicine assist asked to comment on potential Eliquis  failure and what to do regarding the abnormal CT.  Patient is currently breathing nonlabored on room air with normal vitals.  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 rate 19, height 5\' 7"  (1.702 m), weight 64.4 kg, SpO2 98%.        Intake/Output Summary (Last 24 hours) at 06/19/2023 1325 Last data filed at 06/19/2023 1310 Gross per 24 hour  Intake 68.24 ml  Output --  Net 68.24 ml   Filed Weights   06/19/23 0306  Weight: 64.4 kg    Examination: General: No distress HENT: Mucous membranes moist, trachea midline Lungs: Lungs are diminished at bases, faint crackles, no accessory muscle  use Cardiovascular: Heart sounds are regular, extremities are warm Abdomen: Abdomen is soft, positive bowel sounds Extremities: No edema, mild arthritic changes Neuro: Moves to command, okay insight Skin: no rashes  Labs and imaging personally reviewed  Resolved problem list   Assessment and Plan  This is an 83 year old woman with a history of chewing tobacco use, IPF who is presenting with worsening dyspnea on exertion along with some infectious symptoms including productive cough with yellow sputum.  Imaging reveals a potential segmental left lower lobe pulmonary embolism in the area of a enlarging masslike consolidation that has been present for the past couple years.  Along with this mass growth is increasing adenopathy concerning for a primary lung malignancy with spread to the mediastinum.  There could certainly be a associated pulmonary embolus but I think this might be less likely given the location of the embolus and the associated lung architectural distortion.  I would not call this in Eliquis  failure as when I talked to the daughter the patient's compliance is questionable with this medication.  Her daughter would like for her to stay in the hospital this evening, daughter is coming in tomorrow to discuss the CT results as well as the plan as outlined below.  Discontinue heparin  drip, resume Eliquis  Check lower extremity duplex; if + would do 10 BID eliquis  x 1 week loading; if neg I would call the imaging finding more c/w artifact or potentially tumor clot within arterial lumen Treat for possible superimposed pneumonia with Augmentin  and short course of steroids  I will reach out to [name] and Dr. Baldwin Levee to figure out the timing of bronchoscopy in this patient. Should be fine for holding eliquis  for a few days before doing.  This can be done as an outpatient. Walking pulse ox prior to DC Will check on her tomorrow to assure improvement and to speak with daughter who will be  here in afternoon to hopefully take patient home.  Best Practice (right click and "Reselect all SmartList Selections" daily)  Per primary  Labs   CBC: Recent Labs  Lab 06/19/23 0338 06/19/23 0412  WBC  --  12.7*  NEUTROABS  --  10.6*  HGB 12.2 10.6*  HCT 36.0 31.9*  MCV  --  87.2  PLT  --  256    Basic Metabolic Panel: Recent Labs  Lab 06/19/23 0338 06/19/23 0526  NA 134*  --   K 3.4*  --   CL 99  --   GLUCOSE 179*  --   BUN 17  --   CREATININE 1.00  --   MG  --  1.5*   GFR: Estimated Creatinine Clearance: 42.9 mL/min (by C-G formula based on SCr of 1 mg/dL). Recent Labs  Lab 06/19/23 0412  WBC 12.7*    Liver Function Tests: No results for input(s): "AST", "ALT", "ALKPHOS", "BILITOT", "PROT", "ALBUMIN" in the last 168 hours. No results for input(s): "LIPASE", "AMYLASE" in the last 168 hours. No results for input(s): "AMMONIA" in the last 168 hours.  ABG    Component Value Date/Time   TCO2 21 (L) 06/19/2023 0338     Coagulation Profile: No results for input(s): "INR", "PROTIME" in the last 168 hours.  Cardiac Enzymes: No results for input(s): "CKTOTAL", "CKMB", "CKMBINDEX", "TROPONINI" in the last 168 hours.  HbA1C: Hgb A1c MFr Bld  Date/Time Value Ref Range Status  03/26/2023 01:45 PM 6.8 (H) 4.8 - 5.6 % Final    Comment:    (NOTE) Pre diabetes:          5.7%-6.4%  Diabetes:              >6.4%  Glycemic control for   <7.0% adults with diabetes   04/25/2022 10:11 PM 6.7 (H) 4.8 - 5.6 % Final    Comment:    (NOTE)         Prediabetes: 5.7 - 6.4         Diabetes: >6.4         Glycemic control for adults with diabetes: <7.0     CBG: Recent Labs  Lab 06/19/23 1124  GLUCAP 101*    Review of Systems:    Positive Symptoms in bold:  Constitutional fevers, chills, weight loss, fatigue, anorexia, malaise  Eyes decreased vision, double vision, eye irritation  Ears, Nose, Mouth, Throat sore throat, trouble swallowing, sinus congestion   Cardiovascular chest pain, paroxysmal nocturnal dyspnea, lower ext edema, palpitations   Respiratory SOB, cough, DOE, hemoptysis, wheezing  Gastrointestinal nausea, vomiting, diarrhea  Genitourinary burning with urination, trouble urinating  Musculoskeletal joint aches, joint swelling, back pain  Integumentary  rashes, skin lesions  Neurological focal weakness, focal numbness, trouble speaking, headaches  Psychiatric depression, anxiety, confusion  Endocrine polyuria, polydipsia, cold intolerance, heat intolerance  Hematologic abnormal bruising, abnormal bleeding, unexplained nose bleeds  Allergic/Immunologic recurrent infections, hives, swollen lymph nodes     Past Medical History:  She,  has a past medical history of Acute bronchitis (12/17/2021), Acute cystitis (02/20/2019), Arthritis, Atrial flutter (HCC), Breast cancer (HCC), Diabetes mellitus without  complication (HCC), Dyspnea, GERD (gastroesophageal reflux disease), Hypertension, Hypothyroidism, Libman Sacks endocarditis (HCC), Lupus, and Pneumonia (07/31/2021).   Surgical History:   Past Surgical History:  Procedure Laterality Date   BREAST BIOPSY Left 01/15/2023   US  LT BREAST BX W LOC DEV 1ST LESION IMG BX SPEC US  GUIDE 01/15/2023 [facility]   BREAST LUMPECTOMY Left 2009   IR 3D INDEPENDENT [facility]KST  05/29/2021   IR ANGIO INTRA EXTRACRAN SEL COM CAROTID INNOMINATE BILAT MOD SED  05/29/2021   IR ANGIO VERTEBRAL SEL VERTEBRAL BILAT MOD SED  05/29/2021   SIMPLE MASTECTOMY WITH AXILLARY SENTINEL NODE BIOPSY Left 03/31/2023   Procedure: LEFT MASTECTOMY;  Surgeon: Oza Blumenthal, MD;  Location: [facility] OR;  Service: General;  Laterality: Left;  LMA PEC BLOCK     Social History:   reports that she has never smoked. Her smokeless tobacco use includes chew. She reports that she does not drink alcohol  and does not use drugs.   Family History:  Her family history includes Asthma in her brother; Breast cancer (age of onset: 54)  in her daughter; Prostate cancer (age of onset: 40 - 47) in her father.   Allergies Allergies  Allergen Reactions   Ciprofloxacin Itching, Swelling and Other (See Comments)    Possibly causing tremors?   Crestor  [Rosuvastatin ] Other (See Comments)    Myalgia and back pain   Lisinopril  Cough   Ofev  [Nintedanib] Nausea Only     Home Medications  Prior to Admission medications   Medication Sig Start Date End Date Taking? Authorizing Provider  albuterol  (VENTOLIN  HFA) 108 (90 Base) MCG/ACT inhaler Inhale 1-2 puffs into the lungs every 6 (six) hours as needed for wheezing or shortness of breath. 06/10/23  Yes Reddick, Johnathan B, NP  anastrozole  (ARIMIDEX ) 1 MG tablet Take 1 tablet (1 mg total) by mouth daily. 04/08/23  Yes [name], [name], MD  apixaban  (ELIQUIS ) 5 MG TABS tablet TAKE 1 TABLET BY MOUTH 2 TIMES DAILY 04/09/23  Yes Fountain, Madison L, NP  diclofenac  Sodium (VOLTAREN ) 1 % GEL Apply 1 application. topically 4 (four) times daily as needed (pain).   Yes [provider]  diltiazem  (CARDIZEM  CD) 120 MG 24 hr capsule Take 1 capsule (120 mg total) by mouth daily. 01/07/23  Yes Dunn, Dayna N, PA-C  gabapentin  (NEURONTIN ) 300 MG capsule Take 300 mg by mouth 3 (three) times daily.   Yes [provider]  glimepiride  (AMARYL ) 1 MG tablet Take 1 mg by mouth daily with breakfast.   Yes [provider]  hydroxychloroquine  (PLAQUENIL ) 200 MG tablet Take 200 mg by mouth daily.   Yes [provider]  levothyroxine  (SYNTHROID ) 100 MCG tablet Take 100 mcg by mouth daily. 02/05/21  Yes [provider]  spironolactone  (ALDACTONE ) 25 MG tablet Take 0.5 tablets (12.5 mg total) by mouth daily. 01/07/23  Yes Dunn, Dayna N, PA-C  naproxen sodium (ALEVE) 220 MG tablet Take 220-440 mg by mouth 2 (two) times daily as needed (Pain).    [provider]  omeprazole (PRILOSEC) 20 MG capsule Take 20 mg by mouth daily as needed (Indigestion). 12/25/22   [provider]  promethazine -dextromethorphan  (PROMETHAZINE -DM) 6.25-15 MG/5ML syrup Take 5 mLs by mouth 4 (four) times daily as needed for cough. Patient not taking: Reported on 06/19/2023 06/10/23   Reddick, Johnathan B, NP  Tiotropium Bromide  Monohydrate (SPIRIVA  RESPIMAT) 1.25 MCG/ACT AERS Inhale 2 puffs into the lungs daily. Patient not taking: Reported on 09/15/2019 08/17/19 11/29/19  [name], MD  Critical care time: N/A

## 2023-06-19 NOTE — ED Triage Notes (Signed)
 Patient arrives via [facility] from home for fall 2 days ago. On blood thinners and did hit head. Not evaluated after fall. Patient has been experiencing productive cough, diarrhea, weakness and dizziness this week. Developed headache and shortness of breath with exertion.   BP 166/78, HR 127, SPO2 97%

## 2023-06-19 NOTE — ED Notes (Signed)
 Patient returned from CT

## 2023-06-20 ENCOUNTER — Observation Stay (HOSPITAL_BASED_OUTPATIENT_CLINIC_OR_DEPARTMENT_OTHER)

## 2023-06-20 ENCOUNTER — Telehealth: Payer: Self-pay | Admitting: Internal Medicine

## 2023-06-20 DIAGNOSIS — R918 Other nonspecific abnormal finding of lung field: Secondary | ICD-10-CM | POA: Insufficient documentation

## 2023-06-20 DIAGNOSIS — I2694 Multiple subsegmental pulmonary emboli without acute cor pulmonale: Secondary | ICD-10-CM | POA: Diagnosis not present

## 2023-06-20 DIAGNOSIS — R609 Edema, unspecified: Secondary | ICD-10-CM | POA: Diagnosis not present

## 2023-06-20 DIAGNOSIS — R0600 Dyspnea, unspecified: Secondary | ICD-10-CM | POA: Diagnosis not present

## 2023-06-20 DIAGNOSIS — I2699 Other pulmonary embolism without acute cor pulmonale: Secondary | ICD-10-CM | POA: Diagnosis not present

## 2023-06-20 LAB — COMPREHENSIVE METABOLIC PANEL WITH GFR
ALT: 13 U/L (ref 0–44)
AST: 16 U/L (ref 15–41)
Albumin: 2.9 g/dL — ABNORMAL LOW (ref 3.5–5.0)
Alkaline Phosphatase: 62 U/L (ref 38–126)
Anion gap: 11 (ref 5–15)
BUN: 16 mg/dL (ref 8–23)
CO2: 21 mmol/L — ABNORMAL LOW (ref 22–32)
Calcium: 9.1 mg/dL (ref 8.9–10.3)
Chloride: 105 mmol/L (ref 98–111)
Creatinine, Ser: 1.19 mg/dL — ABNORMAL HIGH (ref 0.44–1.00)
GFR, Estimated: 46 mL/min — ABNORMAL LOW (ref >60)
Glucose, Bld: 147 mg/dL — ABNORMAL HIGH (ref 70–99)
Potassium: 4.2 mmol/L (ref 3.5–5.1)
Sodium: 137 mmol/L (ref 135–145)
Total Bilirubin: 0.6 mg/dL (ref 0.0–1.2)
Total Protein: 7.1 g/dL (ref 6.5–8.1)

## 2023-06-20 LAB — CBC
HCT: 34.1 % — ABNORMAL LOW (ref 36.0–46.0)
Hemoglobin: 11 g/dL — ABNORMAL LOW (ref 12.0–15.0)
MCH: 28.6 pg (ref 26.0–34.0)
MCHC: 32.3 g/dL (ref 30.0–36.0)
MCV: 88.8 fL (ref 80.0–100.0)
Platelets: 276 10*3/uL (ref 150–400)
RBC: 3.84 MIL/uL — ABNORMAL LOW (ref 3.87–5.11)
RDW: 13.9 % (ref 11.5–15.5)
WBC: 12.9 10*3/uL — ABNORMAL HIGH (ref 4.0–10.5)
nRBC: 0 % (ref 0.0–0.2)

## 2023-06-20 LAB — GLUCOSE, CAPILLARY
Glucose-Capillary: 133 mg/dL — ABNORMAL HIGH (ref 70–99)
Glucose-Capillary: 141 mg/dL — ABNORMAL HIGH (ref 70–99)
Glucose-Capillary: 196 mg/dL — ABNORMAL HIGH (ref 70–99)

## 2023-06-20 LAB — MAGNESIUM: Magnesium: 2.3 mg/dL (ref 1.7–2.4)

## 2023-06-20 LAB — T4, FREE: Free T4: 1.19 ng/dL — ABNORMAL HIGH (ref 0.61–1.12)

## 2023-06-20 MED ORDER — PREDNISONE 20 MG PO TABS: 20.0000 mg | ORAL_TABLET | Freq: Every day | ORAL | 0 refills | Status: DC

## 2023-06-20 MED ORDER — ALBUTEROL SULFATE 0.63 MG/3ML IN NEBU: 1.0000 | INHALATION_SOLUTION | Freq: Four times a day (QID) | RESPIRATORY_TRACT | 12 refills | Status: DC | PRN

## 2023-06-20 MED ORDER — AMOXICILLIN-POT CLAVULANATE 875-125 MG PO TABS: 1.0000 | ORAL_TABLET | Freq: Two times a day (BID) | ORAL | 0 refills | Status: AC

## 2023-06-20 NOTE — Progress Notes (Signed)
 PIV removed. AVS reviewed. Patient and son verbalize understanding of necessary medication regimen and follow up appointments

## 2023-06-20 NOTE — Care Management CC44 (Signed)
 Condition Code 44 Documentation Completed  Patient Details  Name: [name] MRN: [id number] Date of Birth: 04-14-1940   Condition Code 44 given:  Yes Patient signature on Condition Code 44 notice:  Yes Documentation of 2 MD's agreement:  Yes Code 44 added to claim:  Yes    Jannine Meo, RN 06/20/2023, 12:01 PM

## 2023-06-20 NOTE — Telephone Encounter (Signed)
 Bronchoscopy request and PCC orders placed.  Will send staff message to [facility] and [place].

## 2023-06-20 NOTE — Discharge Instructions (Signed)
 You were admitted for possible blood clot in lung that is small; would like you to continue your eliquis  and try not to miss doses There is some concerning changes in your left lung that need to be looked at with a bronchoscope.  This is probably why your cough is getting worse. Pulmonary office will reach out to you to discuss timing of this procedure. If you do not hear from office by 06/24/23 please call us .

## 2023-06-20 NOTE — Progress Notes (Addendum)
   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 who is presenting with acute on chronic dyspnea on exertion.  She has chronic mMRC 1 dyspnea.  Last night, her breathing got worse.  This is in context of having a fall, viral URI about a week ago with ongoing cough and yellow sputum production.  As part of her workup she had a CTA which showed possible segmental pulmonary embolism in left lower lobe but more concerning which showed an enlarging left lower lobe masslike consolidation.  Pulmonary and critical care medicine assist asked to comment on potential Eliquis  failure and what to do regarding the abnormal CT.  Patient is currently breathing nonlabored on room air with normal vitals.  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 Walked with PT and did stairs without issue. LE duplex still pending.  Objective    Blood pressure 102/61, pulse 72, temperature 98.8 F (37.1 C), temperature source Oral, resp. rate 18, height 5\' 7"  (1.702 m), weight 64.4 kg, SpO2 97%.        Intake/Output Summary (Last 24 hours) at 06/20/2023 1025 Last data filed at 06/19/2023 1700 Gross per 24 hour  Intake 348.5 ml  Output --  Net 348.5 ml   Filed Weights   06/19/23 0306  Weight: 64.4 kg    Examination: No distress Lungs crackles on R base Moves  to command RASS 0 Flat affect Heart sounds regular, ext warm  Resolved problem list   Assessment and Plan  DOE, abnormal CT scan, ?recurrent VTE  See discussion yesterday: either artifact on CTA or eliquis  noncompliance.  Did speak with her about setting alarm with her phone to remind her to take meds.  Will see what duplex looks like.  Can also treat for concurrent inflammatory vs infectious bronchitis as low risk high-reward.  - Continue eliquis  - LE BL duplex and LUE duplex (given recent mastectomy) - Prednisone /augmentin  x 5 days, PTA MDIs are fine - Will discuss case with Drs. Byrum and Ramaswamy and arrange bronchoscopy in next couple weeks for what looks like a lung cancer - Son and patient updated at bedside; daughter is at church but coming in later today, will outline plan in DC paperwork - Ok to continue anastrazole, no link between aromatase inhibitors and VTE    Ardelle Kos MD PCCM

## 2023-06-20 NOTE — Care Management Obs Status (Signed)
 MEDICARE OBSERVATION STATUS NOTIFICATION   Patient Details  Name: [name] MRN: [id number] Date of Birth: 01/06/1941   Medicare Observation Status Notification Given:  Yes    Jannine Meo, RN 06/20/2023, 12:00 PM

## 2023-06-20 NOTE — H&P (View-Only) (Signed)
   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 who is presenting with acute on chronic dyspnea on exertion.  She has chronic mMRC 1 dyspnea.  Last night, her breathing got worse.  This is in context of having a fall, viral URI about a week ago with ongoing cough and yellow sputum production.  As part of her workup she had a CTA which showed possible segmental pulmonary embolism in left lower lobe but more concerning which showed an enlarging left lower lobe masslike consolidation.  Pulmonary and critical care medicine assist asked to comment on potential Eliquis  failure and what to do regarding the abnormal CT.  Patient is currently breathing nonlabored on room air with normal vitals.  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 Walked with PT and did stairs without issue. LE duplex still pending.  Objective    Blood pressure 102/61, pulse 72, temperature 98.8 F (37.1 C), temperature source Oral, resp. rate 18, height 5\' 7"  (1.702 m), weight 64.4 kg, SpO2 97%.        Intake/Output Summary (Last 24 hours) at 06/20/2023 1025 Last data filed at 06/19/2023 1700 Gross per 24 hour  Intake 348.5 ml  Output --  Net 348.5 ml   Filed Weights   06/19/23 0306  Weight: 64.4 kg    Examination: No distress Lungs crackles on R base Moves  to command RASS 0 Flat affect Heart sounds regular, ext warm  Resolved problem list   Assessment and Plan  DOE, abnormal CT scan, ?recurrent VTE  See discussion yesterday: either artifact on CTA or eliquis  noncompliance.  Did speak with her about setting alarm with her phone to remind her to take meds.  Will see what duplex looks like.  Can also treat for concurrent inflammatory vs infectious bronchitis as low risk high-reward.  - Continue eliquis  - LE BL duplex and LUE duplex (given recent mastectomy) - Prednisone /augmentin  x 5 days, PTA MDIs are fine - Will discuss case with Drs. Byrum and Ramaswamy and arrange bronchoscopy in next couple weeks for what looks like a lung cancer - Son and patient updated at bedside; daughter is at church but coming in later today, will outline plan in DC paperwork - Ok to continue anastrazole, no link between aromatase inhibitors and VTE    Ardelle Kos MD PCCM

## 2023-06-20 NOTE — Progress Notes (Signed)
 06/20/2023 Updated daughter and sister by phone. They requested a nebulizer and albuterol  nebs for home: DME ordered. Informed them of duplex LUE, BL LE and that timing of bronch will depend on findings with these.  Ardelle Kos MD PCCM

## 2023-06-20 NOTE — Discharge Summary (Addendum)
 Physician Discharge Summary  CECILIA VANCLEVE BJY:[id number] DOB: 30-Jun-1940 DOA: 06/19/2023  PCP: [name], MD  Admit date: 06/19/2023 Discharge date: 06/20/2023  Time spent: 36 minutes  Recommendations for Outpatient Follow-up:  Patient bronc to be scheduled by pulmonology Needs outpatient Chem-7 CBC in about 1 week Suggest OP UE duplex r/o DVT [very unsuspicious--couldn't be completed in hospital and was delaying d/c--okayed per Dr. Smith] Complete antibiotics, steroids as per orders over the next 4 to 5 days New prescription of albuterol  nebs to be given every 6 as needed for severe shortness of breath  Discharge Diagnoses:  MAIN problem for hospitalization   Inflammatory lung disease?  Infectious versus inflammation versus cancer-unclear which on discharge  Please see below for itemized issues addressed in [facility]- refer to other progress notes for clarity if needed  Discharge Condition: Improved  Diet recommendation: Heart healthy  Filed Weights   06/19/23 0306  Weight: 64.4 kg    History of present illness:  83 year old female With left breast cancer with left mastectomy 04/01/2023-prior breast cancer on the opposite side Paroxysmal A-fib CKD 3 AA Lupus with complicating interstitial lung disease on Plaquenil  Hypothyroidism DM TY 2 Previous episodes of admissions for near syncope   5/15 went to urgent care with intermittent cough sore throat-treated for viral illness with prednisone  promethazine  albuterol  went home   Represent 5/25 process: ED follow-up from home weakness and dizziness in addition to shortness of breath and because of continued dizziness she had a workup sodium 134 potassium 3.4 BUN/creatinine 17/1.0 INS calcium  1.2 magnesium  1.5--WBC 12.7 hemoglobin 10.6 platelet 256 TSH 0.108 Flu screen COVID all negative   CXR multiple radiopaque foreign bodies overlying left lung base--CT head no intracranial or cervical spine injury CT angio chest showed  unfortunately filling defect within segmental branch of the left lower lobe suspicious for pulmonary embolus heterogeneous groundglass attenuation which is nonspecific increased mediastinal bilateral hilar lymph nodes Enlarging masslike density left lower lobe 3.1 X3.3 Pulmonology consulted and they had detailed discussions with Dr. Ardelle Kos who saw the patient explained course to the family and explained likely workup which would include an outpatient bronchoscopy  Because there was low risk and high reward patient was kept on Augmentin  twice daily to complete another 5 days and a short course of steroids Patient was seen by me day of discharge after negative duplex of lower extremities It was not felt that the anomaly on CT scan was a DVT/PE rather artifact Patient has been reminded to take medications on schedule and comply with blood thinner Pulmonology will follow-up in the outpatient setting with her and set up an appointment  She is stable for discharge  Discharge Exam: Vitals:   06/20/23 0833 06/20/23 0919  BP: 117/75 102/61  Pulse:  72  Resp:  18  Temp:  98.8 F (37.1 C)  SpO2: 99% 97%    Subj on day of d/c   Awake coherent alert  General Exam on discharge  Pleasant female looking younger than stated age no icterus no pallor no wheeze no rales or rhonchi S1-S2 no murmur No lower extremity edema Breast not examined Albumin self no rebound no guarding Neuro intact  Discharge Instructions   Discharge Instructions     Diet - low sodium heart healthy   Complete by: As directed    Discharge instructions   Complete by: As directed    Patient will need outpatient bronchoscopy scheduled as per pulmonary-will CC Dr. Birdia Buhl to ensure they are aware and we  will coordinate this in the outpatient Please complete the steroids and antibiotics that you have been prescribed We will also give you nebulized albuterol  to see if this helps you with the breathing issues as  you have been struggling in the past several weeks with some bronchitis/other type of inflammatory issues   Increase activity slowly   Complete by: As directed       Allergies as of 06/20/2023       Reactions   Ciprofloxacin Itching, Swelling, Other (See Comments)   Possibly causing tremors?   Crestor  [rosuvastatin ] Other (See Comments)   Myalgia and back pain   Lisinopril  Cough   Ofev  [nintedanib] Nausea Only        Medication List     STOP taking these medications    naproxen sodium 220 MG tablet Commonly known as: ALEVE       TAKE these medications    albuterol  108 (90 Base) MCG/ACT inhaler Commonly known as: VENTOLIN  HFA Inhale 1-2 puffs into the lungs every 6 (six) hours as needed for wheezing or shortness of breath. What changed: Another medication with the same name was added. Make sure you understand how and when to take each.   albuterol  0.63 MG/3ML nebulizer solution Commonly known as: ACCUNEB  Take 3 mLs (0.63 mg total) by nebulization every 6 (six) hours as needed for wheezing. What changed: You were already taking a medication with the same name, and this prescription was added. Make sure you understand how and when to take each.   amoxicillin -clavulanate 875-125 MG tablet Commonly known as: AUGMENTIN  Take 1 tablet by mouth every 12 (twelve) hours for 9 doses.   anastrozole  1 MG tablet Commonly known as: ARIMIDEX  Take 1 tablet (1 mg total) by mouth daily.   diclofenac  Sodium 1 % Gel Commonly known as: VOLTAREN  Apply 1 application. topically 4 (four) times daily as needed (pain).   diltiazem  120 MG 24 hr capsule Commonly known as: Cardizem  CD Take 1 capsule (120 mg total) by mouth daily.   Eliquis  5 MG Tabs tablet Generic drug: apixaban  TAKE 1 TABLET BY MOUTH 2 TIMES DAILY   gabapentin  300 MG capsule Commonly known as: NEURONTIN  Take 300 mg by mouth 3 (three) times daily.   glimepiride  1 MG tablet Commonly known as: AMARYL  Take 1 mg by  mouth daily with breakfast.   hydroxychloroquine  200 MG tablet Commonly known as: PLAQUENIL  Take 200 mg by mouth daily.   levothyroxine  100 MCG tablet Commonly known as: SYNTHROID  Take 100 mcg by mouth daily.   omeprazole 20 MG capsule Commonly known as: PRILOSEC Take 20 mg by mouth daily as needed (Indigestion).   predniSONE  20 MG tablet Commonly known as: DELTASONE  Take 1 tablet (20 mg total) by mouth daily with breakfast. Start taking on: Jun 21, 2023   promethazine -dextromethorphan  6.25-15 MG/5ML syrup Commonly known as: PROMETHAZINE -DM Take 5 mLs by mouth 4 (four) times daily as needed for cough.   spironolactone  25 MG tablet Commonly known as: ALDACTONE  Take 0.5 tablets (12.5 mg total) by mouth daily.               Durable Medical Equipment  (From admission, onward)           Start     Ordered   06/20/23 1055  For home use only DME Nebulizer/meds  Once       Question Answer Comment  Patient needs a nebulizer to treat with the following condition Bronchitis   Length of Need Lifetime  06/20/23 1054   06/20/23 1054  For home use only DME Nebulizer machine  Once       Question Answer Comment  Patient needs a nebulizer to treat with the following condition Bronchitis   Length of Need Lifetime   Additional equipment included Administration kit      06/20/23 1054           Allergies  Allergen Reactions   Ciprofloxacin Itching, Swelling and Other (See Comments)    Possibly causing tremors?   Crestor  [Rosuvastatin ] Other (See Comments)    Myalgia and back pain   Lisinopril  Cough   Ofev  [Nintedanib] Nausea Only    Follow-up Information     [name], MD Follow up.   Specialty: Pulmonary Disease Why: We will call you to set up procedure to look in your lungs called bronchoscopy Contact information: [address] [place] [address] [phone]                  The results of significant diagnostics from  this hospitalization (including imaging, microbiology, ancillary and laboratory) are listed below for reference.    Significant Diagnostic Studies: VAS US  LOWER EXTREMITY VENOUS (DVT) Result Date: 06/20/2023  Lower Venous DVT Study Patient Name:  STEPHANNIE BRONER  Date of Exam:   06/20/2023 Medical Rec #: [id number]       Accession #:    [id number] Date of Birth: 06-13-1940        Patient Gender: F Patient Age:   83 years Exam Location:  [facility] Procedure:      VAS US  LOWER EXTREMITY VENOUS (DVT) Referring Phys: Felton Hough --------------------------------------------------------------------------------  Indications: Stroke.  Comparison Study: NA Performing Technologist: Gelene Kelly RDCS  Examination Guidelines: A complete evaluation includes B-mode imaging, spectral Doppler, color Doppler, and power Doppler as needed of all accessible portions of each vessel. Bilateral testing is considered an integral part of a complete examination. Limited examinations for reoccurring indications may be performed as noted. The reflux portion of the exam is performed with the patient in reverse Trendelenburg.  +---------+---------------+---------+-----------+----------+--------------+ RIGHT    CompressibilityPhasicitySpontaneityPropertiesThrombus Aging +---------+---------------+---------+-----------+----------+--------------+ CFV      Full           Yes      Yes                                 +---------+---------------+---------+-----------+----------+--------------+ SFJ      Full           Yes      Yes                                 +---------+---------------+---------+-----------+----------+--------------+ FV Prox  Full           Yes      Yes                                 +---------+---------------+---------+-----------+----------+--------------+ FV Mid   Full           Yes      Yes                                  +---------+---------------+---------+-----------+----------+--------------+ FV DistalFull  Yes      Yes                                 +---------+---------------+---------+-----------+----------+--------------+ PFV      Full                                                        +---------+---------------+---------+-----------+----------+--------------+ POP      Full           Yes      Yes                                 +---------+---------------+---------+-----------+----------+--------------+ PTV      Full           Yes      Yes                                 +---------+---------------+---------+-----------+----------+--------------+ PERO     Full           Yes      Yes                                 +---------+---------------+---------+-----------+----------+--------------+ Gastroc  Full                                                        +---------+---------------+---------+-----------+----------+--------------+ GSV      Full           Yes      Yes                                 +---------+---------------+---------+-----------+----------+--------------+   +---------+---------------+---------+-----------+----------+--------------+ LEFT     CompressibilityPhasicitySpontaneityPropertiesThrombus Aging +---------+---------------+---------+-----------+----------+--------------+ CFV      Full           Yes      Yes                                 +---------+---------------+---------+-----------+----------+--------------+ SFJ      Full           Yes      Yes                                 +---------+---------------+---------+-----------+----------+--------------+ FV Prox  Full           Yes      Yes                                 +---------+---------------+---------+-----------+----------+--------------+ FV Mid   Full           Yes      Yes                                  +---------+---------------+---------+-----------+----------+--------------+  FV DistalFull           Yes      Yes                                 +---------+---------------+---------+-----------+----------+--------------+ PFV      Full                                                        +---------+---------------+---------+-----------+----------+--------------+ POP      Full           Yes      Yes                                 +---------+---------------+---------+-----------+----------+--------------+ PTV      Full           Yes      Yes                                 +---------+---------------+---------+-----------+----------+--------------+ PERO     Full           Yes      Yes                                 +---------+---------------+---------+-----------+----------+--------------+ Gastroc  Full                                                        +---------+---------------+---------+-----------+----------+--------------+ GSV      Full           Yes      Yes                                 +---------+---------------+---------+-----------+----------+--------------+     Summary: BILATERAL: - No evidence of deep vein thrombosis seen in the lower extremities, bilaterally. -No evidence of popliteal cyst, bilaterally.   *See table(s) above for measurements and observations.    Preliminary    ECHOCARDIOGRAM COMPLETE Result Date: 06/19/2023    ECHOCARDIOGRAM REPORT   Patient Name:   MURIAL BEAM Date of Exam: 06/19/2023 Medical Rec #:  [id number]      Height:       67.0 in Accession #:    [id number]     Weight:       142.0 lb Date of Birth:  12-30-1940       BSA:          1.748 m Patient Age:    83 years       BP:           140/69 mmHg Patient Gender: F              HR:           78 bpm. Exam Location:  Inpatient Procedure: 2D Echo, Color Doppler, Cardiac Doppler and Intracardiac  Opacification Agent (Both Spectral and Color Flow Doppler were             utilized during procedure). Indications:    Pulmonary Embolus  History:        Patient has prior history of Echocardiogram examinations, most                 recent 10/12/2022. CHF, Arrythmias:Atrial Flutter; Risk                 Factors:Hypertension, Diabetes, Dyslipidemia and Hypothyroidism.                 GERD, Lupus.  Sonographer:    Travis Friedman RDCS Referring Phys: [phone] Regino Caprio PAHWANI IMPRESSIONS  1. Left ventricular ejection fraction, by estimation, is 60 to 65%. The left ventricle has normal function. The left ventricle has no regional wall motion abnormalities. There is moderate asymmetric left ventricular hypertrophy of the basal-septal segment. Left ventricular diastolic parameters are consistent with Grade I diastolic dysfunction (impaired relaxation).  2. Right ventricular systolic function is normal. The right ventricular size is normal. There is mildly elevated pulmonary artery systolic pressure. The estimated right ventricular systolic pressure is 37.6 mmHg.  3. The mitral valve is degenerative. Thickened leaflets. Trivial mitral valve regurgitation.  4. The aortic valve is tricuspid. Aortic valve regurgitation is not visualized. Aortic valve sclerosis is present, with no evidence of aortic valve stenosis.  5. The inferior vena cava is normal in size with greater than 50% respiratory variability, suggesting right atrial pressure of 3 mmHg. FINDINGS  Left Ventricle: Left ventricular ejection fraction, by estimation, is 60 to 65%. The left ventricle has normal function. The left ventricle has no regional wall motion abnormalities. The left ventricular internal cavity size was normal in size. There is  moderate asymmetric left ventricular hypertrophy of the basal-septal segment. Left ventricular diastolic parameters are consistent with Grade I diastolic dysfunction (impaired relaxation). Right Ventricle: The right ventricular size is normal. No increase in right ventricular wall thickness. Right  ventricular systolic function is normal. There is mildly elevated pulmonary artery systolic pressure. The tricuspid regurgitant velocity is 2.94  m/s, and with an assumed right atrial pressure of 3 mmHg, the estimated right ventricular systolic pressure is 37.6 mmHg. Left Atrium: Left atrial size was normal in size. Right Atrium: Right atrial size was normal in size. Pericardium: There is no evidence of pericardial effusion. Mitral Valve: The mitral valve is degenerative in appearance. Trivial mitral valve regurgitation. Tricuspid Valve: The tricuspid valve is normal in structure. Tricuspid valve regurgitation is mild. Aortic Valve: The aortic valve is tricuspid. Aortic valve regurgitation is not visualized. Aortic valve sclerosis is present, with no evidence of aortic valve stenosis. Aortic valve mean gradient measures 3.0 mmHg. Aortic valve peak gradient measures 5.5  mmHg. Aortic valve area, by VTI measures 2.84 cm. Pulmonic Valve: The pulmonic valve was grossly normal. Pulmonic valve regurgitation is mild. Aorta: The aortic root and ascending aorta are structurally normal, with no evidence of dilitation. Venous: The inferior vena cava is normal in size with greater than 50% respiratory variability, suggesting right atrial pressure of 3 mmHg. IAS/Shunts: The interatrial septum was not well visualized.  LEFT VENTRICLE PLAX 2D LVIDd:         3.80 cm      Diastology LVIDs:         2.00 cm      LV e' medial:    6.42 cm/s LV PW:  0.90 cm      LV E/e' medial:  12.1 LV IVS:        1.10 cm      LV e' lateral:   8.16 cm/s LVOT diam:     2.00 cm      LV E/e' lateral: 9.5 LV SV:         71 LV SV Index:   40 LVOT Area:     3.14 cm  LV Volumes (MOD) LV vol d, MOD A2C: 79.4 ml LV vol d, MOD A4C: 110.0 ml LV vol s, MOD A2C: 25.9 ml LV vol s, MOD A4C: 36.6 ml LV SV MOD A2C:     53.5 ml LV SV MOD A4C:     110.0 ml LV SV MOD BP:      64.0 ml RIGHT VENTRICLE             IVC RV Basal diam:  3.20 cm     IVC diam: 1.20 cm RV  S prime:     16.40 cm/s TAPSE (M-mode): 2.1 cm LEFT ATRIUM             Index        RIGHT ATRIUM           Index LA diam:        3.00 cm 1.72 cm/m   RA Area:     12.00 cm LA Vol (A2C):   46.7 ml 26.71 ml/m  RA Volume:   21.60 ml  12.35 ml/m LA Vol (A4C):   28.6 ml 16.36 ml/m LA Biplane Vol: 37.2 ml 21.28 ml/m  AORTIC VALVE AV Area (Vmax):    2.60 cm AV Area (Vmean):   2.81 cm AV Area (VTI):     2.84 cm AV Vmax:           117.00 cm/s AV Vmean:          79.600 cm/s AV VTI:            0.249 m AV Peak Grad:      5.5 mmHg AV Mean Grad:      3.0 mmHg LVOT Vmax:         96.90 cm/s LVOT Vmean:        71.300 cm/s LVOT VTI:          0.225 m LVOT/AV VTI ratio: 0.90  AORTA Ao Root diam: 3.60 cm Ao Asc diam:  3.10 cm MITRAL VALVE                TRICUSPID VALVE MV Area (PHT): 3.08 cm     TR Peak grad:   34.6 mmHg MV Decel Time: 246 msec     TR Vmax:        294.00 cm/s MV E velocity: 77.60 cm/s MV A velocity: 133.00 cm/s  SHUNTS MV E/A ratio:  0.58         Systemic VTI:  0.22 m                             Systemic Diam: 2.00 cm Carson Clara MD Electronically signed by Carson Clara MD Signature Date/Time: 06/19/2023/1:17:37 PM    Final    CT Angio Chest PE W and/or Wo Contrast Result Date: 06/19/2023 CLINICAL DATA:  Syncope/presyncope. Evaluate for pulmonary embolism. EXAM: CT ANGIOGRAPHY CHEST WITH CONTRAST TECHNIQUE: Multidetector CT imaging of the chest was performed using the standard protocol during bolus administration of intravenous contrast. Multiplanar  CT image reconstructions and MIPs were obtained to evaluate the vascular anatomy. RADIATION DOSE REDUCTION: This exam was performed according to the departmental dose-optimization program which includes automated exposure control, adjustment of the mA and/or kV according to patient size and/or use of iterative reconstruction technique. CONTRAST:  75mL ISOVUE-370 IOPAMIDOL (ISOVUE-370) INJECTION 76% COMPARISON:  10/12/2022 FINDINGS: Cardiovascular:  Satisfactory opacification of the pulmonary arteries to the segmental level. There is a suspected focal filling defect within a segmental branch of the left lower lobe pulmonary artery, image 239/7. No additional signs of acute pulmonary embolism identified. Aortic atherosclerosis. Multi vessel coronary artery calcifications. Mediastinum/Nodes: Increased size of mediastinal and bilateral hilar lymph nodes. Pre-vascular lymph node within the anterior mediastinum measures 0.9 cm. Previously 1 cm. Left hilar lymph node measures 1.1 cm, image 29/5. Previously 0.3 cm. Thyroid  gland, trachea, and esophagus are unremarkable. Lungs/Pleura: Signs of chronic interstitial lung disease identified with bilateral peripheral, subpleural interstitial reticulation and honeycombing with a lower lung zone predominance. These findings appear similar to the prior examination. Bilateral heterogeneous ground-glass attenuation is noted within both lower lobes with mild consolidative changes noted in the superior segment of left lower lobe. There is a masslike density within the left lower lobe which measures 3.1 x 3.3 cm, image 127/6. On the previous exam from 10/22/2022 this measured 2.8 x 2.1 cm. On the examination from 04/26/2022 this area measured 1.5 x 1.5 cm. Upper Abdomen: No acute abnormality. Musculoskeletal: No chest wall abnormality. No acute or significant osseous findings. Review of the MIP images confirms the above findings. IMPRESSION: 1. Filling defect within segmental branch of the left lower lobe pulmonary artery is suspicious for pulmonary embolism. 2. Signs of chronic interstitial lung disease with bilateral peripheral, subpleural interstitial reticulation and honeycombing with a lower lung zone predominance. These findings appear similar to the prior examination. 3. Bilateral heterogeneous ground-glass attenuation is noted within both lower lobes with mild consolidative changes noted in the superior segment of left  lower lobe. Although nonspecific these findings may reflect superimposed inflammation or infection versus progressive changes of interstitial lung disease. 4. Increased size of mediastinal and bilateral hilar lymph nodes. These are favored to be reactive in etiology. 5. Enlarging masslike density within the left lower lobe which measures 3.1 x 3.3 cm. On the previous exam from 10/22/2022 this measured 2.8 x 2.1 cm. On the examination from 04/26/2022 this area measured 1.5 x 1.5 cm. Findings are concerning for enlarging neoplasm. Recommend referral to pulmonary medicine or thoracic surgery for further management. 6. Coronary artery calcifications. 7.  Aortic Atherosclerosis (ICD10-I70.0). Critical Value/emergent results were called by telephone at the time of interpretation on 06/19/2023 at 8:34 am to provider Davene Ernst, who verbally acknowledged these results. Choose 1 Electronically Signed   By: Kimberley Penman M.D.   On: 06/19/2023 08:34   CT Head Wo Contrast Result Date: 06/19/2023 CLINICAL DATA:  Blunt poly trauma due to fall. EXAM: CT HEAD WITHOUT CONTRAST CT CERVICAL SPINE WITHOUT CONTRAST TECHNIQUE: Multidetector CT imaging of the head and cervical spine was performed following the standard protocol without intravenous contrast. Multiplanar CT image reconstructions of the cervical spine were also generated. RADIATION DOSE REDUCTION: This exam was performed according to the departmental dose-optimization program which includes automated exposure control, adjustment of the mA and/or kV according to patient size and/or use of iterative reconstruction technique. COMPARISON:  04/26/2022 head CT FINDINGS: CT HEAD FINDINGS Brain: No evidence of acute infarction, hemorrhage, hydrocephalus, extra-axial collection or mass lesion/mass effect. Cerebral volume loss  in keeping with age Vascular: No hyperdense vessel or unexpected calcification. Skull: Normal. Negative for fracture or focal lesion. Sinuses/Orbits: No  acute finding. CT CERVICAL SPINE FINDINGS Alignment: No traumatic malalignment Skull base and vertebrae: No acute fracture. No primary bone lesion or focal pathologic process. Soft tissues and spinal canal: No prevertebral fluid or swelling. No visible canal hematoma. Disc levels: Generalized degenerative endplate spurring with underlying protrusions, especially bulky endplate spurring at C3-4 to C6-7. Upper chest: Negative IMPRESSION: No evidence of acute intracranial or cervical spine injury. Electronically Signed   By: Ronnette Coke M.D.   On: 06/19/2023 05:28   CT Cervical Spine Wo Contrast Result Date: 06/19/2023 CLINICAL DATA:  Blunt poly trauma due to fall. EXAM: CT HEAD WITHOUT CONTRAST CT CERVICAL SPINE WITHOUT CONTRAST TECHNIQUE: Multidetector CT imaging of the head and cervical spine was performed following the standard protocol without intravenous contrast. Multiplanar CT image reconstructions of the cervical spine were also generated. RADIATION DOSE REDUCTION: This exam was performed according to the departmental dose-optimization program which includes automated exposure control, adjustment of the mA and/or kV according to patient size and/or use of iterative reconstruction technique. COMPARISON:  04/26/2022 head CT FINDINGS: CT HEAD FINDINGS Brain: No evidence of acute infarction, hemorrhage, hydrocephalus, extra-axial collection or mass lesion/mass effect. Cerebral volume loss in keeping with age Vascular: No hyperdense vessel or unexpected calcification. Skull: Normal. Negative for fracture or focal lesion. Sinuses/Orbits: No acute finding. CT CERVICAL SPINE FINDINGS Alignment: No traumatic malalignment Skull base and vertebrae: No acute fracture. No primary bone lesion or focal pathologic process. Soft tissues and spinal canal: No prevertebral fluid or swelling. No visible canal hematoma. Disc levels: Generalized degenerative endplate spurring with underlying protrusions, especially bulky  endplate spurring at C3-4 to C6-7. Upper chest: Negative IMPRESSION: No evidence of acute intracranial or cervical spine injury. Electronically Signed   By: Ronnette Coke M.D.   On: 06/19/2023 05:28   DG Chest Portable 1 View Result Date: 06/19/2023 CLINICAL DATA:  Status post fall 2 days ago. EXAM: PORTABLE CHEST 1 VIEW COMPARISON:  October 11, 2022 FINDINGS: The cardiac silhouette is mildly enlarged and unchanged in size. Mild atelectatic changes are seen within the bilateral lung bases. No pleural effusion or pneumothorax is identified. A cluster of numerous radiopaque pellet like foreign bodies and adjacent larger, round, less radiopaque foreign bodies are seen overlying the left lung base. Degenerative changes seen throughout the thoracic spine. IMPRESSION: 1. Stable cardiomegaly with mild bibasilar atelectasis. 2. Multiple radiopaque foreign bodies overlying the left lung base. This may be postoperative in origin. Correlation with physical examination and the patient's surgical history is recommended Electronically Signed   By: Virgle Grime M.D.   On: 06/19/2023 03:50    Microbiology: Recent Results (from the past 240 hours)  Resp panel by RT-PCR (RSV, Flu A&B, Covid) Anterior Nasal Swab     Status: None   Collection Time: 06/19/23  4:12 AM   Specimen: Anterior Nasal Swab  Result Value Ref Range Status   SARS Coronavirus 2 by RT PCR NEGATIVE NEGATIVE Final   Influenza A by PCR NEGATIVE NEGATIVE Final   Influenza B by PCR NEGATIVE NEGATIVE Final    Comment: (NOTE) The Xpert Xpress SARS-CoV-2/FLU/RSV plus assay is intended as an aid in the diagnosis of influenza from Nasopharyngeal swab specimens and should not be used as a sole basis for treatment. Nasal washings and aspirates are unacceptable for Xpert Xpress SARS-CoV-2/FLU/RSV testing.  Fact Sheet for Patients: BloggerCourse.com  Fact Sheet  for Healthcare  Providers: SeriousBroker.it  This test is not yet approved or cleared by the [place]  FDA and has been authorized for detection and/or diagnosis of SARS-CoV-2 by FDA under an Emergency Use Authorization (EUA). This EUA will remain in effect (meaning this test can be used) for the duration of the COVID-19 declaration under Section 564(b)(1) of the Act, 21 U.S.C. section 360bbb-3(b)(1), unless the authorization is terminated or revoked.     Resp Syncytial Virus by PCR NEGATIVE NEGATIVE Final    Comment: (NOTE) Fact Sheet for Patients: BloggerCourse.com  Fact Sheet for Healthcare Providers: SeriousBroker.it  This test is not yet approved or cleared by the [place]  FDA and has been authorized for detection and/or diagnosis of SARS-CoV-2 by FDA under an Emergency Use Authorization (EUA). This EUA will remain in effect (meaning this test can be used) for the duration of the COVID-19 declaration under Section 564(b)(1) of the Act, 21 U.S.C. section 360bbb-3(b)(1), unless the authorization is terminated or revoked.  Performed at [facility], [address], [place], [place] [address]      Labs: Basic Metabolic Panel: Recent Labs  Lab 06/19/23 0338 06/19/23 0526 06/20/23 0553  NA 134*  --  137  K 3.4*  --  4.2  CL 99  --  105  CO2  --   --  21*  GLUCOSE 179*  --  147*  BUN 17  --  16  CREATININE 1.00  --  1.19*  CALCIUM   --   --  9.1  MG  --  1.5* 2.3   Liver Function Tests: Recent Labs  Lab 06/20/23 0553  AST 16  ALT 13  ALKPHOS 62  BILITOT 0.6  PROT 7.1  ALBUMIN 2.9*   No results for input(s): "LIPASE", "AMYLASE" in the last 168 hours. No results for input(s): "AMMONIA" in the last 168 hours. CBC: Recent Labs  Lab 06/19/23 0338 06/19/23 0412 06/20/23 0553  WBC  --  12.7* 12.9*  NEUTROABS  --  10.6*  --   HGB 12.2 10.6* 11.0*  HCT 36.0 31.9* 34.1*  MCV  --  87.2  88.8  PLT  --  256 276   Cardiac Enzymes: No results for input(s): "CKTOTAL", "CKMB", "CKMBINDEX", "TROPONINI" in the last 168 hours. BNP: BNP (last 3 results) Recent Labs    10/11/22 1431  BNP 403.8*    ProBNP (last 3 results) Recent Labs    10/29/22 1405  PROBNP 59.0    CBG: Recent Labs  Lab 06/19/23 1124 06/19/23 1639 06/19/23 1921 06/20/23 0745 06/20/23 1109  GLUCAP 101* 124* 120* 133* 141*    Signed:  Verlie Glisson MD   [facility] 06/20/2023, 1:21 PM

## 2023-06-20 NOTE — Evaluation (Signed)
 Physical Therapy Evaluation Patient Details Name: [name] MRN: [id number] DOB: 1940/03/08 Today's Date: 06/20/2023  History of Present Illness  The pt is an 83 yo female presenting 5/24 with c/o weakness, dizziness, and SOB with exertion. Work up revealed acute PE and left lower lung mass. CT head without acute injury. Also reports a fall 1 week PTA with head and back injury. PMH includes: arthritis, lupus, ILD, breast cancer s/p mastectomy 03/31/2023, HTN, DM II, HLD, and frequent UTIs.   Clinical Impression  Pt in bed upon arrival of PT, agreeable to evaluation at this time. Prior to admission the pt was independent with mobility and ADLs, but does report intermittent use of SPC. The pt was able to complete bed mobility without assistance and completed sit-stand transfers and hallway ambulation with CGA to supervision. No overt LOB and SpO2 stable at 99% with all activity. As pt has a flight of stairs to her apt, I also had her complete 2 sets of 5 stairs which she tolerated well with single rail and CGA for safety. Pt reports fatigue but all VSS. Will continue to follow acutely, but anticipate no follow up PT needs. Pt encouraged to mobilize throughout the day during hospitalization (with mobility or RN staff) and after return home.       If plan is discharge home, recommend the following: A little help with walking and/or transfers;Assistance with cooking/housework;Help with stairs or ramp for entrance   Can travel by private vehicle        Equipment Recommendations None recommended by PT  Recommendations for Other Services       Functional Status Assessment Patient has had a recent decline in their functional status and demonstrates the ability to make significant improvements in function in a reasonable and predictable amount of time.     Precautions / Restrictions Precautions Precautions: Fall Recall of Precautions/Restrictions: Intact Restrictions Weight Bearing Restrictions  Per Provider Order: No      Mobility  Bed Mobility Overal bed mobility: Needs Assistance Bed Mobility: Supine to Sit     Supine to sit: Supervision     General bed mobility comments: slightly increased time, no cues or assist    Transfers Overall transfer level: Needs assistance Equipment used: None Transfers: Sit to/from Stand Sit to Stand: Supervision           General transfer comment: no UE support, stable    Ambulation/Gait Ambulation/Gait assistance: Supervision, Contact guard assist Gait Distance (Feet): 200 Feet Assistive device: None Gait Pattern/deviations: Step-through pattern, Decreased stride length Gait velocity: decreased Gait velocity interpretation: <1.31 ft/sec, indicative of household ambulator   General Gait Details: pt with slow but stable gait. no UE support or need for physical assistance, but pt admits slower than baseline. SpO2 stable on RA 99%  Stairs Stairs: Yes   Stair Management: One rail Right, Alternating pattern, Step to pattern, Forwards Number of Stairs: 5 (+ 5) General stair comments: alternating ascending, step-to descending with CGA     Balance Overall balance assessment: Mild deficits observed, not formally tested                                           Pertinent Vitals/Pain Pain Assessment Pain Assessment: No/denies pain    Home Living Family/patient expects to be discharged to:: Private residence Living Arrangements: Children Available Help at Discharge: Family;Available 24 hours/day Type of Home:  Apartment Home Access: Stairs to enter Entrance Stairs-Rails: Right;Left Entrance Stairs-Number of Steps: 12   Home Layout: One level Home Equipment: Cane - single point;Tub bench;BSC/3in1 Additional Comments: pt reports largely sedentary, has 2 and 81 yo at home and plays with them    Prior Function Prior Level of Function : Independent/Modified Independent             Mobility Comments:  independent, does some IADLS. Uses SPC PRN, no driving ADLs Comments: independent with ADLs and some IADLs     Extremity/Trunk Assessment   Upper Extremity Assessment Upper Extremity Assessment: Overall WFL for tasks assessed    Lower Extremity Assessment Lower Extremity Assessment: Overall WFL for tasks assessed (grossly 4+/5 to MMT)    Cervical / Trunk Assessment Cervical / Trunk Assessment: Normal  Communication   Communication Communication: No apparent difficulties    Cognition Arousal: Alert Behavior During Therapy: WFL for tasks assessed/performed, Flat affect   PT - Cognitive impairments: No apparent impairments                       PT - Cognition Comments: pt with slightly flat affect, WFL for session Following commands: Intact       Cueing Cueing Techniques: Verbal cues     General Comments General comments (skin integrity, edema, etc.): VSS on RA SpO2 99%        Assessment/Plan    PT Assessment Patient needs continued PT services  PT Problem List Decreased activity tolerance;Decreased balance       PT Treatment Interventions Gait training;Stair training;Functional mobility training;Therapeutic activities;Therapeutic exercise;Balance training;Patient/family education    PT Goals (Current goals can be found in the Care Plan section)  Acute Rehab PT Goals Patient Stated Goal: return home PT Goal Formulation: With patient Time For Goal Achievement: 07/04/23 Potential to Achieve Goals: Good    Frequency Min 1X/week        AM-PAC PT "6 Clicks" Mobility  Outcome Measure Help needed turning from your back to your side while in a flat bed without using bedrails?: None Help needed moving from lying on your back to sitting on the side of a flat bed without using bedrails?: None Help needed moving to and from a bed to a chair (including a wheelchair)?: A Little Help needed standing up from a chair using your arms (e.g., wheelchair or bedside  chair)?: A Little Help needed to walk in hospital room?: A Little Help needed climbing 3-5 steps with a railing? : A Little 6 Click Score: 20    End of Session Equipment Utilized During Treatment: Gait belt Activity Tolerance: Patient tolerated treatment well Patient left: in chair;with call bell/phone within reach;with chair alarm set Nurse Communication: Mobility status PT Visit Diagnosis: Unsteadiness on feet (R26.81);Difficulty in walking, not elsewhere classified (R26.2)    Time: 0831-0900 PT Time Calculation (min) (ACUTE ONLY): 29 min   Charges:   PT Evaluation $PT Eval Low Complexity: 1 Low PT Treatments $Therapeutic Exercise: 8-22 mins PT General Charges $$ ACUTE PT VISIT: 1 Visit         Barnabas Booth, PT, DPT   Acute Rehabilitation Department Office [phone] Secure Chat Communication Preferred  Lona Rist 06/20/2023, 9:23 AM

## 2023-06-22 ENCOUNTER — Telehealth: Payer: Self-pay | Admitting: Emergency Medicine

## 2023-06-22 NOTE — Telephone Encounter (Signed)
 Recent admission with abnormal CT chest, concern for malignancy.  Need to call her to discuss bronchoscopy. Dr Felipe Horton has made the case request.

## 2023-06-22 NOTE — Telephone Encounter (Signed)
 I spoke with patient's daughter about setting up navigational bronchoscopy to evaluate the left lower lobe mass.  She had discussed this with Dr. Felipe Horton while the patient was admitted.  She and family are in agreement with proceeding.  I will try to get this set up for 06/29/2023.  I advised them that she will need to be off her Eliquis  2 days prior.

## 2023-06-23 ENCOUNTER — Telehealth: Payer: Self-pay | Admitting: Internal Medicine

## 2023-06-23 NOTE — Telephone Encounter (Signed)
 Cmn received from [employer] for Nebulizer and supplies.

## 2023-06-25 ENCOUNTER — Other Ambulatory Visit: Payer: Self-pay

## 2023-06-25 ENCOUNTER — Encounter (HOSPITAL_COMMUNITY): Payer: Self-pay | Admitting: Emergency Medicine

## 2023-06-25 NOTE — Progress Notes (Signed)
 Anesthesia Chart Review: Same day workup  83 year old female follows with cardiology for history of HTN, HLD, paroxysmal a flutter, coronary calcium  on CT, systemic lupus.  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  She was treated with anticoagulation. Xarelto  was discontinued in 2022 through shared decision making due to echocardiogram revealing well-healed vegetation at the tip of the mitral valve.  Admitted Sept 2024 with progressive SOB, intermittent fevers, found with PNA and Aflutter. Had spontaneous CV to SR, started on OAC.  Seen by Mertha Abrahams, PA-C 03/01/2023 for preop evaluation prior to mastectomy.  Per note, "Low RCRI score.  Reviewed with Dr. Berry Bristol who knows her well.  No cardiac contraindication to mastectomy.  OK to old Eliquis  2 days pre-op to resume when safe to do so afterwards." She had subsequent followup with EP cardiologist Dr. Marven Slimmer to discuss ablation for atrial flutter; decided on conservative approach for now.   Follows with pulmonologist [name] for history of asthma, systemic lupus, ILD. Per notes, PFTs have been stable since 2019, ONO in October 2024 showed no desaturations.  Recent admission 5/24-5/24/25 after presenting with shortness of breath and weakness. Chest CT showed enlarging masslike density left lower lobe 3.1 x 3.3 cm. There was initial concern for PE, but finding on CTA was ultimately felt to be artifact. Pulmonology consulted and recommended outpatient bronchoscopy. She also completed a course of Augmentin  and steroids.   History of breast cancer in 2009 treated with lumpectomy and radiation with recent recurrence. She is s/p left mastectomy on 04/01/23. No chemo or radiation. She is on anastrozole .    Other pertinent history includes GERD (on omeprazole), hypothyroid, non-insulin -dependent DM2 (A1c 6.8 on 03/26/23).   CMP and CBC  06/20/23 reviewed, creatinine mildly elevated 1.19, mildly elevated WBC 12.9, mild anemia Hgb 11.0.  She will need DOS evaluation.    EKG 03/01/2023: NSR.  Rate 83.  Nonspecific ST-T changes.   CTA Chest 06/19/23: IMPRESSION: 1. Filling defect within segmental branch of the left lower lobe pulmonary artery is suspicious for pulmonary embolism. 2. Signs of chronic interstitial lung disease with bilateral peripheral, subpleural interstitial reticulation and honeycombing with a lower lung zone predominance. These findings appear similar to the prior examination. 3. Bilateral heterogeneous ground-glass attenuation is noted within both lower lobes with mild consolidative changes noted in the superior segment of left lower lobe. Although nonspecific these findings may reflect superimposed inflammation or infection versus progressive changes of interstitial lung disease. 4. Increased size of mediastinal and bilateral hilar lymph nodes. These are favored to be reactive in etiology. 5. Enlarging masslike density within the left lower lobe which measures 3.1 x 3.3 cm. On the previous exam from 10/22/2022 this measured 2.8 x 2.1 cm. On the examination from 04/26/2022 this area measured 1.5 x 1.5 cm. Findings are concerning for enlarging neoplasm. Recommend referral to pulmonary medicine or thoracic surgery for further management. 6. Coronary artery calcifications. 7.  Aortic Atherosclerosis (ICD10-I70.0).   Critical Value/emergent results were called by telephone at the time of interpretation on 06/19/2023 at 8:34 am to provider Davene Ernst, who verbally acknowledged these results.  PFTs 03/08/2023: FVC-%Pred-Pre % 62  FEV1-%Pred-Pre % 67  Pre FEV1/FVC ratio % 82  FEV1FVC-%Pred-Pre % 110  DLCO unc % pred % 44    TTE 10/12/2022: 1. Left ventricular ejection fraction, by estimation, is 60 to 65%. The  left ventricle has  normal function. The left ventricle has no regional  wall motion  abnormalities. There is mild left ventricular hypertrophy.  Left ventricular diastolic parameters  are consistent with Grade II diastolic dysfunction (pseudonormalization).  Elevated left atrial pressure.   2. Right ventricular systolic function is normal. The right ventricular  size is normal. There is mildly elevated pulmonary artery systolic  pressure.   3. Left atrial size was mildly dilated.   4. Right atrial size was mildly dilated.   5. The mitral valve is normal in structure. Mild mitral valve  regurgitation. No evidence of mitral stenosis.   6. The aortic valve is tricuspid. Aortic valve regurgitation is trivial.  Aortic valve sclerosis is present, with no evidence of aortic valve  stenosis.   7. The inferior vena cava is normal in size with greater than 50%  respiratory variability, suggesting right atrial pressure of 3 mmHg.    Lexiscan  myoview  stress test 07/03/2016: 1. The resting electrocardiogram demonstrated normal sinus rhythm, normal resting conduction, PVC and normal rest repolarization. Stress EKG is non-diagnostic for ischemia as it a pharmacologic stress using Lexiscan . Stress symptoms included dyspnea. 2. Myocardial perfusion imaging is normal. Overall left ventricular systolic function was normal without regional wall motion abnormalities. The left ventricular ejection fraction was 75%.    Edilia Gordon [facility]/Anesthesiology Phone [phone] 06/25/2023 4:21 PM

## 2023-06-25 NOTE — Progress Notes (Addendum)
 SDW CALL  Patient was given pre-op instructions over the phone. The opportunity was given for the patient to ask questions. No further questions asked. Patient verbalized understanding of instructions given.   PCP - [name], MD  Cardiologist - Knox Perl, MD Electrophysiology - Cardiology Boyce Byes, MD   PPM/ICD - denies Device Orders - n/a Rep Notified - n/a  Chest x-ray - 06-19-23 EKG - 06-19-23 Stress Test -  ECHO - 06-19-23 Cardiac Cath -  PFT-03-08-23  Sleep Study - denies CPAP - n/a  Fasting Blood Sugar - per daughter mom does not check blood sugar often Checks Blood Sugar  MD monitors A1c. Last A1c 6-.8 on 03-26-23.   Blood Thinner Instructions: apixaban  (ELIQUIS ) Hold 2 days prior to procedure Last dose 06-26-23 Aspirin  Instructions:denies  ERAS Protcol- PRE-SURGERY Ensure or G2-   COVID TEST- n/a   Anesthesia review: yes hx of A flutter,HTN, DM, recent ED visit x 2 in May  Patient denies shortness of breath, fever, cough and chest pain over the phone call   All instructions explained to the patient, with a verbal understanding of the material. Patient agrees to go over the instructions while at home for a better understanding.

## 2023-06-25 NOTE — Anesthesia Preprocedure Evaluation (Addendum)
 Anesthesia Evaluation  Patient identified by MRN, date of birth, ID band Patient awake    Reviewed: Allergy & Precautions, NPO status , Patient's Chart, lab work & pertinent test results, reviewed documented beta blocker date and time   Airway Mallampati: I  TM Distance: >3 FB Neck ROM: Full    Dental no notable dental hx. (+) Edentulous Upper, Edentulous Lower   Pulmonary shortness of breath, pneumonia Lung mass   Pulmonary exam normal breath sounds clear to auscultation       Cardiovascular hypertension, Pt. on medications +CHF  Normal cardiovascular exam+ dysrhythmias Atrial Fibrillation  Rhythm:Regular Rate:Normal  Hx/o Nedra Ball endocarditis   Neuro/Psych negative neurological ROS  negative psych ROS   GI/Hepatic ,GERD  Medicated,,  Endo/Other  diabetes, Well Controlled, Type 2, Oral Hypoglycemic AgentsHypothyroidism  HLD  Renal/GU Renal disease  negative genitourinary   Musculoskeletal  (+) Arthritis , Osteoarthritis,    Abdominal   Peds  Hematology  (+) Blood dyscrasia, anemia Eliquis  therapy- last dose 5/31 SLE   Anesthesia Other Findings   Reproductive/Obstetrics                             Anesthesia Physical Anesthesia Plan  ASA: 3  Anesthesia Plan: General   Post-op Pain Management: Minimal or no pain anticipated   Induction: Intravenous  PONV Risk Score and Plan: 4 or greater and Treatment may vary due to age or medical condition, Propofol  infusion, Ondansetron  and TIVA  Airway Management Planned: Oral ETT  Additional Equipment: None  Intra-op Plan:   Post-operative Plan: Extubation in OR  Informed Consent: I have reviewed the patients History and Physical, chart, labs and discussed the procedure including the risks, benefits and alternatives for the proposed anesthesia with the patient or authorized representative who has indicated his/her understanding and  acceptance.     Dental advisory given  Plan Discussed with: CRNA and Anesthesiologist  Anesthesia Plan Comments: (PAT note by Rudy Costain, PA-C:  83 year old female follows with cardiology for history of HTN, HLD, paroxysmal a flutter, coronary calcium  on CT, systemic lupus.  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  She was treated with anticoagulation. Xarelto  was discontinued in 2022 through shared decision making due to echocardiogram revealing well-healed vegetation at the tip of the mitral valve.  Admitted Sept 2024 with progressive SOB, intermittent fevers, found with PNA and Aflutter. Had spontaneous CV to SR, started on OAC.  Seen by Mertha Abrahams, PA-C 03/01/2023 for preop evaluation prior to mastectomy.  Per note, "Low RCRI score.  Reviewed with Dr. Berry Bristol who knows her well.  No cardiac contraindication to mastectomy.  OK to old Eliquis  2 days pre-op to resume when safe to do so afterwards." She had subsequent followup with EP cardiologist Dr. Marven Slimmer to discuss ablation for atrial flutter; decided on conservative approach for now.  Follows with pulmonologist [name] for history of asthma, systemic lupus, ILD. Per notes, PFTs have been stable since 2019, ONO in October 2024 showed no desaturations.  Recent admission 5/24-5/24/25 after presenting with shortness of breath and weakness. Chest CT showed enlarging masslike density left lower lobe 3.1 x 3.3 cm. There was initial concern for PE, but finding on CTA was ultimately felt to be artifact. Pulmonology consulted and recommended outpatient bronchoscopy. She also completed a course of Augmentin  and steroids.  History of breast cancer in 2009 treated with lumpectomy and radiation with recent recurrence. She is s/p left mastectomy on 04/01/23. No chemo or radiation. She is on anastrozole .   Other pertinent history  includes GERD (on omeprazole), hypothyroid, non-insulin -dependent DM2 (A1c 6.8 on 03/26/23).  CMP and CBC 06/20/23 reviewed, creatinine mildly elevated 1.19, mildly elevated WBC 12.9, mild anemia Hgb 11.0.  She will need DOS evaluation.   EKG 03/01/2023: NSR.  Rate 83.  Nonspecific ST-T changes.  CTA Chest 06/19/23: IMPRESSION: 1. Filling defect within segmental branch of the left lower lobe pulmonary artery is suspicious for pulmonary embolism. 2. Signs of chronic interstitial lung disease with bilateral peripheral, subpleural interstitial reticulation and honeycombing with a lower lung zone predominance. These findings appear similar to the prior examination. 3. Bilateral heterogeneous ground-glass attenuation is noted within both lower lobes with mild consolidative changes noted in the superior segment of left lower lobe. Although nonspecific these findings may reflect superimposed inflammation or infection versus progressive changes of interstitial lung disease. 4. Increased size of mediastinal and bilateral hilar lymph nodes. These are favored to be reactive in etiology. 5. Enlarging masslike density within the left lower lobe which measures 3.1 x 3.3 cm. On the previous exam from 10/22/2022 this measured 2.8 x 2.1 cm. On the examination from 04/26/2022 this area measured 1.5 x 1.5 cm. Findings are concerning for enlarging neoplasm. Recommend referral to pulmonary medicine or thoracic surgery for further management. 6. Coronary artery calcifications. 7.  Aortic Atherosclerosis (ICD10-I70.0).  Critical Value/emergent results were called by telephone at the time of interpretation on 06/19/2023 at 8:34 am to provider Davene Ernst, who verbally acknowledged these results.  PFTs 03/08/2023: FVC-%Pred-Pre % 62 FEV1-%Pred-Pre % 67 Pre FEV1/FVC ratio % 82 FEV1FVC-%Pred-Pre % 110 DLCO unc % pred % 44  TTE 10/12/2022: 1. Left ventricular ejection fraction, by estimation, is 60 to  65%. The  left ventricle has normal function. The left ventricle has no regional  wall motion abnormalities. There is mild left ventricular hypertrophy.  Left ventricular diastolic parameters  are consistent with Grade II diastolic dysfunction (pseudonormalization).  Elevated left atrial pressure.  2. Right ventricular systolic function is normal. The right ventricular  size is normal. There is mildly elevated pulmonary artery systolic  pressure.  3. Left atrial size was mildly dilated.  4. Right atrial size was mildly dilated.  5. The mitral valve is normal in structure. Mild mitral valve  regurgitation. No evidence of mitral stenosis.  6. The aortic valve is tricuspid. Aortic valve regurgitation is trivial.  Aortic valve sclerosis is present, with no evidence of aortic valve  stenosis.  7. The inferior vena cava is normal in size with greater than 50%  respiratory variability, suggesting right atrial pressure of 3 mmHg.   Lexiscan  myoview  stress test 07/03/2016: 1. The resting electrocardiogram demonstrated normal sinus rhythm, normal resting conduction, PVC and normal rest repolarization. Stress EKG is non-diagnostic for ischemia as it a pharmacologic stress using Lexiscan . Stress symptoms included dyspnea. 2. Myocardial perfusion imaging is normal. Overall left ventricular systolic function was normal without regional wall motion abnormalities. The left ventricular ejection fraction was 75%.  )        Anesthesia Quick Evaluation

## 2023-06-26 DIAGNOSIS — E1141 Type 2 diabetes mellitus with diabetic mononeuropathy: Secondary | ICD-10-CM | POA: Diagnosis not present

## 2023-06-26 DIAGNOSIS — I10 Essential (primary) hypertension: Secondary | ICD-10-CM | POA: Diagnosis not present

## 2023-06-26 DIAGNOSIS — E1169 Type 2 diabetes mellitus with other specified complication: Secondary | ICD-10-CM | POA: Diagnosis not present

## 2023-06-26 DIAGNOSIS — J432 Centrilobular emphysema: Secondary | ICD-10-CM | POA: Diagnosis not present

## 2023-06-29 ENCOUNTER — Ambulatory Visit (HOSPITAL_COMMUNITY)

## 2023-06-29 ENCOUNTER — Ambulatory Visit (HOSPITAL_COMMUNITY): Payer: Self-pay | Admitting: Physician Assistant

## 2023-06-29 ENCOUNTER — Ambulatory Visit (HOSPITAL_COMMUNITY)
Admission: RE | Admit: 2023-06-29 | Discharge: 2023-06-29 | Disposition: A | Discharge status: Home / Self Care | Attending: Emergency Medicine | Admitting: Emergency Medicine

## 2023-06-29 ENCOUNTER — Other Ambulatory Visit: Payer: Self-pay

## 2023-06-29 ENCOUNTER — Encounter (HOSPITAL_COMMUNITY): Payer: Self-pay | Admitting: Emergency Medicine

## 2023-06-29 ENCOUNTER — Encounter (HOSPITAL_COMMUNITY)
Admission: RE | Disposition: A | Discharge status: Home / Self Care | Payer: Self-pay | Source: Home / Self Care | Attending: Emergency Medicine

## 2023-06-29 DIAGNOSIS — M329 Systemic lupus erythematosus, unspecified: Secondary | ICD-10-CM | POA: Diagnosis not present

## 2023-06-29 DIAGNOSIS — R590 Localized enlarged lymph nodes: Secondary | ICD-10-CM | POA: Diagnosis not present

## 2023-06-29 DIAGNOSIS — C50919 Malignant neoplasm of unspecified site of unspecified female breast: Secondary | ICD-10-CM | POA: Insufficient documentation

## 2023-06-29 DIAGNOSIS — I5032 Chronic diastolic (congestive) heart failure: Secondary | ICD-10-CM | POA: Diagnosis not present

## 2023-06-29 DIAGNOSIS — I509 Heart failure, unspecified: Secondary | ICD-10-CM | POA: Insufficient documentation

## 2023-06-29 DIAGNOSIS — Z7901 Long term (current) use of anticoagulants: Secondary | ICD-10-CM | POA: Diagnosis not present

## 2023-06-29 DIAGNOSIS — C801 Malignant (primary) neoplasm, unspecified: Secondary | ICD-10-CM | POA: Diagnosis not present

## 2023-06-29 DIAGNOSIS — C771 Secondary and unspecified malignant neoplasm of intrathoracic lymph nodes: Secondary | ICD-10-CM | POA: Diagnosis not present

## 2023-06-29 DIAGNOSIS — C3412 Malignant neoplasm of upper lobe, left bronchus or lung: Secondary | ICD-10-CM | POA: Insufficient documentation

## 2023-06-29 DIAGNOSIS — I110 Hypertensive heart disease with heart failure: Secondary | ICD-10-CM | POA: Diagnosis not present

## 2023-06-29 DIAGNOSIS — E785 Hyperlipidemia, unspecified: Secondary | ICD-10-CM | POA: Diagnosis not present

## 2023-06-29 DIAGNOSIS — R918 Other nonspecific abnormal finding of lung field: Secondary | ICD-10-CM

## 2023-06-29 DIAGNOSIS — Z48813 Encounter for surgical aftercare following surgery on the respiratory system: Secondary | ICD-10-CM | POA: Diagnosis not present

## 2023-06-29 DIAGNOSIS — C3432 Malignant neoplasm of lower lobe, left bronchus or lung: Secondary | ICD-10-CM | POA: Diagnosis not present

## 2023-06-29 DIAGNOSIS — J849 Interstitial pulmonary disease, unspecified: Secondary | ICD-10-CM | POA: Diagnosis not present

## 2023-06-29 DIAGNOSIS — Z7984 Long term (current) use of oral hypoglycemic drugs: Secondary | ICD-10-CM | POA: Diagnosis not present

## 2023-06-29 DIAGNOSIS — C3492 Malignant neoplasm of unspecified part of left bronchus or lung: Secondary | ICD-10-CM | POA: Diagnosis not present

## 2023-06-29 DIAGNOSIS — Z79811 Long term (current) use of aromatase inhibitors: Secondary | ICD-10-CM | POA: Diagnosis not present

## 2023-06-29 HISTORY — PX: VIDEO BRONCHOSCOPY WITH ENDOBRONCHIAL ULTRASOUND: SHX6177

## 2023-06-29 HISTORY — PX: BRONCHIAL NEEDLE ASPIRATION BIOPSY: SHX5106

## 2023-06-29 HISTORY — PX: BRONCHIAL BRUSHINGS: SHX5108

## 2023-06-29 HISTORY — PX: BRONCHIAL BIOPSY: SHX5109

## 2023-06-29 HISTORY — PX: VIDEO BRONCHOSCOPY WITH ENDOBRONCHIAL NAVIGATION: SHX6175

## 2023-06-29 LAB — BASIC METABOLIC PANEL WITH GFR
Anion gap: 11 (ref 5–15)
BUN: 12 mg/dL (ref 8–23)
CO2: 24 mmol/L (ref 22–32)
Calcium: 8.9 mg/dL (ref 8.9–10.3)
Chloride: 99 mmol/L (ref 98–111)
Creatinine, Ser: 0.95 mg/dL (ref 0.44–1.00)
GFR, Estimated: >60 mL/min (ref >60)
Glucose, Bld: 135 mg/dL — ABNORMAL HIGH (ref 70–99)
Potassium: 3.9 mmol/L (ref 3.5–5.1)
Sodium: 134 mmol/L — ABNORMAL LOW (ref 135–145)

## 2023-06-29 LAB — GLUCOSE, CAPILLARY
Glucose-Capillary: 151 mg/dL — ABNORMAL HIGH (ref 70–99)
Glucose-Capillary: 126 mg/dL — ABNORMAL HIGH (ref 70–99)

## 2023-06-29 SURGERY — VIDEO BRONCHOSCOPY WITH ENDOBRONCHIAL NAVIGATION
Anesthesia: General | Laterality: Left

## 2023-06-29 MED ORDER — ALBUTEROL SULFATE HFA 108 (90 BASE) MCG/ACT IN AERS
INHALATION_SPRAY | RESPIRATORY_TRACT | Status: DC | PRN
  Administered 2023-06-29: 3 via RESPIRATORY_TRACT

## 2023-06-29 MED ORDER — PROPOFOL 500 MG/50ML IV EMUL
INTRAVENOUS | Status: DC | PRN
Start: 2023-06-29 — End: 2023-06-29
  Administered 2023-06-29: 150 ug/kg/min via INTRAVENOUS

## 2023-06-29 MED ORDER — FENTANYL CITRATE (PF) 100 MCG/2ML IJ SOLN
INTRAMUSCULAR | Status: AC
  Filled 2023-06-29: qty 2

## 2023-06-29 MED ORDER — LIDOCAINE 2% (20 MG/ML) 5 ML SYRINGE
INTRAMUSCULAR | Status: DC | PRN
  Administered 2023-06-29: 20 mg via INTRAVENOUS
  Administered 2023-06-29: 40 mg via INTRAVENOUS

## 2023-06-29 MED ORDER — SUGAMMADEX SODIUM 200 MG/2ML IV SOLN
INTRAVENOUS | Status: DC | PRN
Start: 2023-06-29 — End: 2023-06-29
  Administered 2023-06-29: 200 mg via INTRAVENOUS

## 2023-06-29 MED ORDER — ONDANSETRON HCL 4 MG/2ML IJ SOLN
INTRAMUSCULAR | Status: DC | PRN
Start: 2023-06-29 — End: 2023-06-29
  Administered 2023-06-29: 4 mg via INTRAVENOUS

## 2023-06-29 MED ORDER — PROPOFOL 10 MG/ML IV BOLUS
INTRAVENOUS | Status: DC | PRN
Start: 2023-06-29 — End: 2023-06-29
  Administered 2023-06-29: 20 mg via INTRAVENOUS
  Administered 2023-06-29: 80 mg via INTRAVENOUS

## 2023-06-29 MED ORDER — APIXABAN 5 MG PO TABS: 5.0000 mg | ORAL_TABLET | Freq: Two times a day (BID) | ORAL | Status: DC

## 2023-06-29 MED ORDER — OXYCODONE HCL 5 MG PO TABS: 5.0000 mg | ORAL_TABLET | Freq: Once | ORAL | Status: DC | PRN

## 2023-06-29 MED ORDER — FENTANYL CITRATE (PF) 100 MCG/2ML IJ SOLN: 25.0000 ug | INTRAMUSCULAR | Status: DC | PRN

## 2023-06-29 MED ORDER — LACTATED RINGERS IV SOLN: INTRAVENOUS | Status: DC

## 2023-06-29 MED ORDER — ROCURONIUM BROMIDE 10 MG/ML (PF) SYRINGE
PREFILLED_SYRINGE | INTRAVENOUS | Status: DC | PRN
  Administered 2023-06-29: 40 mg via INTRAVENOUS

## 2023-06-29 MED ORDER — OXYCODONE HCL 5 MG/5ML PO SOLN: 5.0000 mg | Freq: Once | ORAL | Status: DC | PRN

## 2023-06-29 MED ORDER — INSULIN ASPART 100 UNIT/ML IJ SOLN: 0.0000 [IU] | INTRAMUSCULAR | Status: DC | PRN

## 2023-06-29 MED ORDER — ESMOLOL HCL 100 MG/10ML IV SOLN
INTRAVENOUS | Status: DC | PRN
Start: 2023-06-29 — End: 2023-06-29
  Administered 2023-06-29: 20 mg via INTRAVENOUS

## 2023-06-29 MED ORDER — DEXAMETHASONE SODIUM PHOSPHATE 10 MG/ML IJ SOLN
INTRAMUSCULAR | Status: DC | PRN
  Administered 2023-06-29: 5 mg via INTRAVENOUS

## 2023-06-29 MED ORDER — ONDANSETRON HCL 4 MG/2ML IJ SOLN: 4.0000 mg | Freq: Once | INTRAMUSCULAR | Status: DC | PRN

## 2023-06-29 MED ORDER — CHLORHEXIDINE GLUCONATE 0.12 % MT SOLN
15.0000 mL | Freq: Once | OROMUCOSAL | Status: AC
  Administered 2023-06-29: 15 mL via OROMUCOSAL
  Filled 2023-06-29: qty 15

## 2023-06-29 SURGICAL SUPPLY — 38 items

## 2023-06-29 NOTE — Anesthesia Procedure Notes (Signed)
 Procedure Name: Intubation Date/Time: 06/29/2023 10:50 AM  Performed by: Sherma Diver, CRNAPre-anesthesia Checklist: Patient identified, Emergency Drugs available, Suction available and Patient being monitored Patient Re-evaluated:Patient Re-evaluated prior to induction Oxygen  Delivery Method: Circle system utilized Preoxygenation: Pre-oxygenation with 100% oxygen  Induction Type: IV induction Ventilation: Mask ventilation without difficulty Laryngoscope Size: McGrath and 3 Grade View: Grade I Tube type: Oral Tube size: 8.5 mm Number of attempts: 1 Airway Equipment and Method: Stylet and Oral airway Placement Confirmation: ETT inserted through vocal cords under direct vision, positive ETCO2 and breath sounds checked- equal and bilateral Secured at: 20 cm Tube secured with: Tape Dental Injury: Teeth and Oropharynx as per pre-operative assessment

## 2023-06-29 NOTE — Interval H&P Note (Signed)
 History and Physical Interval Note:  06/29/2023 10:28 AM  [name]  has presented today for surgery, with the diagnosis of Lung mass, adenopathy.  The various methods of treatment have been discussed with the patient and family. After consideration of risks, benefits and other options for treatment, the patient has consented to  Procedure(s): VIDEO BRONCHOSCOPY WITH ENDOBRONCHIAL NAVIGATION (Left) BRONCHOSCOPY, WITH EBUS (Bilateral) as a surgical intervention.  The patient's history has been reviewed, patient examined, no change in status, stable for surgery.  I have reviewed the patient's chart and labs.  Questions were answered to the patient's satisfaction.     [name]

## 2023-06-29 NOTE — Interval H&P Note (Signed)
 History and Physical Interval Note:  06/29/2023 9:13 AM  [name]  has presented today for surgery, with the diagnosis of Lung mass, adenopathy.  The various methods of treatment have been discussed with the patient and family. After consideration of risks, benefits and other options for treatment, the patient has consented to  Procedure(s): VIDEO BRONCHOSCOPY WITH ENDOBRONCHIAL NAVIGATION (Left) BRONCHOSCOPY, WITH EBUS (Bilateral) as a surgical intervention.  The patient's history has been reviewed, patient examined, no change in status, stable for surgery.  I have reviewed the patient's chart and labs.  Questions were answered to the patient's satisfaction.     [name]

## 2023-06-29 NOTE — Op Note (Addendum)
 Video Bronchoscopy with Endobronchial Ultrasound and Electromagnetic Navigation Procedure Note  Date of Operation: 06/29/2023  Pre-op Diagnosis: Left lower lobe mass, mediastinal adenopathy  Post-op Diagnosis: Same  Surgeon: Racheal Buddle  Assistants: None  Anesthesia: General endotracheal anesthesia  Operation: Flexible video fiberoptic bronchoscopy with endobronchial ultrasound, robotic assisted navigation and biopsies.  Estimated Blood Loss: Minimal  Complications: None apparent  Indications and History: [name] is a 83 y.o. female with history of breast cancer, former tobacco use, ILD, recent diagnosis of pulmonary embolism.  She was hospitalized in May at which time CT-PA identified the PE and also a left upper lobe mass that have enlarging on serial imaging, mediastinal adenopathy.  Recommendation was made to achieve a tissue diagnosis using endobronchial ultrasound and robotic assisted navigational bronchoscopy. The risks, benefits, complications, treatment options and expected outcomes were discussed with the patient.  The possibilities of pneumothorax, pneumonia, reaction to medication, pulmonary aspiration, perforation of a viscus, bleeding, failure to diagnose a condition and creating a complication requiring transfusion or operation were discussed with the patient who freely signed the consent.    Description of Procedure: The patient was seen in the Preoperative Area, was examined and was deemed appropriate to proceed.  The patient was taken to [facility] Endoscopy room 3, identified as [name] and the procedure verified as Flexible Video Fiberoptic Bronchoscopy with robotic assisted navigation and endobronchial ultrasound.  A Time Out was held and the above information confirmed.   Robotic assisted navigation: Prior to the date of the procedure a high-resolution CT scan of the chest was performed. Utilizing ION software program a virtual tracheobronchial tree was  generated to allow the creation of distinct navigation pathways to the patient's parenchymal abnormalities. After being taken to the operating room general anesthesia was initiated and the patient  was orally intubated. The video fiberoptic bronchoscope was introduced via the endotracheal tube and a general inspection was performed which showed normal right and left lung anatomy. Aspiration of the bilateral mainstems was completed to remove any remaining secretions. Robotic catheter inserted into patient's endotracheal tube.   Target #1 left lower lobe mass: The distinct navigation pathways prepared prior to this procedure were then utilized to navigate to patient's lesion identified on CT scan. The robotic catheter was secured into place and the vision probe was withdrawn.  Lesion location was approximated using fluoroscopy.  Local registration and targeting was performed using [employer] Cios mobile C-arm three-dimensional imaging. Under fluoroscopic guidance transbronchial needle brushings, transbronchial needle biopsies, and transbronchial forceps biopsies were performed to be sent for cytology and pathology.  Needle-in-lesion was confirmed using Cios mobile C-arm.     Endobronchial ultrasound: The robotic scope was then withdrawn and the endobronchial ultrasound was used to identify and characterize the peritracheal, hilar and bronchial lymph nodes. Inspection showed no enlarged nodes on the right side.  There was enlargement at station 4L, 7, 12L. Using real-time ultrasound guidance Wang needle biopsies were take from Station 4L, 7, 12L nodes and were sent for cytology.   At the end of the procedure a general airway inspection was performed and there was no evidence of active bleeding. The bronchoscope was removed.  The patient tolerated the procedure well. There was no significant blood loss and there were no obvious complications. A post-procedural chest x-ray is pending.  Samples Target  #1: 1. Transbronchial needle brushings from left lower lobe mass 2. Transbronchial Wang needle biopsies from left lower lobe mass 3. Transbronchial forceps biopsies from left lower  lobe mass  EBUS Samples: 1. Wang needle biopsies from 4L node 2. Wang needle biopsies from 7 node 3. Wang needle biopsies from 12L node  Other samples: 1.  Foundation 1 liquid test   Racheal Buddle, MD, PhD 06/29/2023, 11:57 AM [facility]

## 2023-06-29 NOTE — Anesthesia Postprocedure Evaluation (Signed)
 Anesthesia Post Note  Patient: [name]  Procedure(s) Performed: VIDEO BRONCHOSCOPY WITH ENDOBRONCHIAL NAVIGATION (Left) BRONCHOSCOPY, WITH EBUS (Bilateral) BRONCHOSCOPY, WITH NEEDLE ASPIRATION BIOPSY BRONCHOSCOPY, WITH BRUSH BIOPSY BRONCHOSCOPY, WITH BIOPSY     Patient location during evaluation: PACU Anesthesia Type: General Level of consciousness: awake and alert and oriented Pain management: pain level controlled Vital Signs Assessment: post-procedure vital signs reviewed and stable Respiratory status: nonlabored ventilation, respiratory function stable and spontaneous breathing Cardiovascular status: blood pressure returned to baseline and stable Postop Assessment: no apparent nausea or vomiting Anesthetic complications: no   No notable events documented.  Last Vitals:  Vitals:   06/29/23 1215 06/29/23 1230  BP: 132/77 (!) 136/99  Pulse: 84 73  Resp: (!) 22 19  Temp:    SpO2: 100% 100%    Last Pain:  Vitals:   06/29/23 1230  TempSrc:   PainSc: 0-No pain                 Enedelia Martorelli A.

## 2023-06-29 NOTE — Transfer of Care (Signed)
 Immediate Anesthesia Transfer of Care Note  Patient: [name]  Procedure(s) Performed: VIDEO BRONCHOSCOPY WITH ENDOBRONCHIAL NAVIGATION (Left) BRONCHOSCOPY, WITH EBUS (Bilateral) BRONCHOSCOPY, WITH NEEDLE ASPIRATION BIOPSY BRONCHOSCOPY, WITH BRUSH BIOPSY BRONCHOSCOPY, WITH BIOPSY  Patient Location: PACU  Anesthesia Type:General  Level of Consciousness: awake, alert , and oriented  Airway & Oxygen  Therapy: Patient Spontanous Breathing and Patient connected to nasal cannula oxygen   Post-op Assessment: Report given to RN and Post -op Vital signs reviewed and stable  Post vital signs: Reviewed and stable  Last Vitals:  Vitals Value Taken Time  BP 132/77 06/29/23 1215  Temp    Pulse 81 06/29/23 1218  Resp 17 06/29/23 1218  SpO2 100 % 06/29/23 1218  Vitals shown include unfiled device data.  Last Pain:  Vitals:   06/29/23 1210  TempSrc:   PainSc: (P) 0-No pain         Complications: No notable events documented.

## 2023-06-29 NOTE — Discharge Instructions (Signed)
 Flexible Bronchoscopy, Care After This sheet gives you information about how to care for yourself after your test. Your doctor may also give you more specific instructions. If you have problems or questions, contact your doctor. Follow these instructions at home: Eating and drinking When you are wide awake, your numbness is gone and your cough and gag reflexes have come back, you may: Start eating only soft foods. Slowly drink liquids. Six hours after the test, go back to your normal diet. Driving Do not drive for 24 hours if you were given a medicine to help you relax (sedative). Do not drive or use heavy machinery while taking prescription pain medicine. General instructions Take over-the-counter and prescription medicines only as told by your doctor. Return to your normal activities as told. Ask what activities are safe for you. Do not use any products that have nicotine or tobacco in them. This includes cigarettes and e-cigarettes. If you need help quitting, ask your doctor. Keep all follow-up visits as told by your doctor. This is important. It is very important if you had a tissue sample (biopsy) taken. Get help right away if: You have shortness of breath that gets worse. You get light-headed. You feel like you are going to pass out (faint). You have chest pain. You cough up: More than a little blood. More blood than before. Summary Do not use cigarettes. Do not use e-cigarettes. Seek care in the Emergency Department right away if you have chest pain or shortness of breath. Call or MyChart Message our office for any questions or problems at [phone].  Okay to restart your Eliquis  on 06/30/2023   This information is not intended to replace advice given to you by your health care provider. Make sure you discuss any questions you have with your health care provider.

## 2023-06-30 ENCOUNTER — Encounter (HOSPITAL_COMMUNITY): Payer: Self-pay | Admitting: Emergency Medicine

## 2023-07-01 LAB — CYTOLOGY - NON PAP

## 2023-07-06 NOTE — Telephone Encounter (Signed)
 CMN signed, faxed, faxed conformation attached and sent to scan.

## 2023-07-07 ENCOUNTER — Ambulatory Visit: Admitting: Acute Care

## 2023-07-08 ENCOUNTER — Other Ambulatory Visit: Payer: Self-pay

## 2023-07-08 NOTE — Progress Notes (Signed)
 History of Present Illness [name] is a 83 y.o. female never smoker, but does chew tobacco, with ILD, followed by [name], and history of breast cancer with left mastectomy 04/01/2023, prior breast cancer on the opposite side. Pt. Had a recent hospital admission 05/2023 with abnormal CT Chest, concerning for malignancy.She underwent bronchoscopy with biopsies by Dr. Baldwin Levee as an inpatient. She is here today for follow up.   PMH includes  Paroxysmal A-fib CKD 3 AA Lupus with complicating interstitial lung disease on Plaquenil  Hypothyroidism DM TY 2 Previous episodes of admissions for near syncope  07/08/2023 [name] is an 83 year old female with a recent history of pulmonary embolism and new lung nodule seen on recent chest imaging. She and her daughter [name] present today for follow-up after in patient bronchoscopy revealing adenocarcinoma per cytology. She is accompanied by her daughter, [name]. She was referred by Dr. Sidney Drain following a bronchoscopy that revealed adenocarcinoma.  She was admitted to the hospital  for dyspnea, and pulmonary embolism was diagnosed. During the workup, a lung nodule was discovered, leading to a bronchoscopy. The bronchoscopy pathology showed a left lower lobe nodule positive for  adenocarcinoma per cytology. Additionally, pathology from lymph nodes numbered four, seven, and twelve on the left side showed malignant cells.  Pt. Denies any significant weight loss over the past six months. Post-procedure, she experienced minor bleeding when coughing, which has since resolved. No fever and her sputum is clear, though she experiences some shortness of breath, which she attributes to exertion from cleaning.This has resolved.  Her past medical history includes a breast cancer and interstitial lung disease. She uses a nebulizer with albuterol  solution and has been prescribed an albuterol  inhaler, though she has not yet obtained it due to pharmacy  availability issues. She was previously on oxygen  following a hospital stay but is no longer using it.Oxygen  saturations today are 98% on room air.  She has a history of using chewing tobacco but denies smoking cigarettes. There is no formal diagnosis of COPD, though she experiences shortness of breath with exertion , most likely related to her ILD, which is progressive. She  uses albuterol  for relief.  We have discussed her biopsy results. 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 I explained we will refer her over to both medical oncology and radiation oncology for treatment options. Both patient and her daughter asked good questions and verbalized understanding of the above.PET scan has been ordered, as well as MRI Brain to stage disease.  Test Results:  Cytology 06/29/2023 A. LUNG, LLL, FINE NEEDLE ASPIRATION  BIOPSIES:  - Adenocarcinoma   B. LUNG, LLL, BRUSHING:  - Adenocarcinoma   FINAL MICROSCOPIC DIAGNOSIS:  C. LYMPH NODE, 4L, FINE NEEDLE ASPIRATION:  - Malignant cells present   D. LYMPH NODE, 7, FINE NEEDLE ASPIRATION:  - Malignant cells present   E. LYMPH NODE, 12L, FINE NEEDLE ASPIRATION:  - Malignant cells present      Latest Ref Rng & Units 06/20/2023    5:53 AM 06/19/2023    4:12 AM 06/19/2023    3:38 AM  CBC  WBC 4.0 - 10.5 K/uL 12.9  12.7    Hemoglobin 12.0 - 15.0 g/dL 16.1  09.6  04.5   Hematocrit 36.0 - 46.0 % 34.1  31.9  36.0   Platelets 150 - 400 K/uL 276  256  Latest Ref Rng & Units 06/29/2023    9:25 AM 06/20/2023    5:53 AM 06/19/2023    3:38 AM  BMP  Glucose 70 - 99 mg/dL 130  865  784   BUN 8 - 23 mg/dL 12  16  17    Creatinine 0.44 - 1.00 mg/dL 6.96  2.95  2.84   Sodium 135 - 145 mmol/L 134  137  134   Potassium 3.5 - 5.1 mmol/L 3.9  4.2  3.4   Chloride 98 - 111 mmol/L 99  105  99   CO2 22 - 32 mmol/L 24  21    Calcium  8.9 - 10.3 mg/dL 8.9  9.1      BNP     Component Value Date/Time   BNP 403.8 (H) 10/11/2022 1431    ProBNP    Component Value Date/Time   PROBNP 59.0 10/29/2022 1405    PFT    Component Value Date/Time   FEV1PRE 1.50 03/08/2023 1311   FVCPRE 1.83 03/08/2023 1311   TLC 4.89 01/13/2022 1232   DLCOUNC 9.13 03/08/2023 1311   PREFEV1FVCRT 82 03/08/2023 1311    DG Chest Port 1 View Result Date: 06/29/2023 CLINICAL DATA:  Status post bronchoscopy EXAM: PORTABLE CHEST 1 VIEW COMPARISON:  Jun 19, 2023 FINDINGS: Ill-defined bilateral basilar infiltrates or atelectasis or combination of both without consolidations No pneumothorax Heart and mediastinum normal IMPRESSION: Ill-defined bilateral basilar infiltrates or atelectasis or combination of both without consolidations. Post bronchoscopy. No pneumothorax Electronically Signed   By: Fredrich Jefferson M.D.   On: 06/29/2023 14:08   DG C-ARM BRONCHOSCOPY Result Date: 06/29/2023 C-ARM BRONCHOSCOPY: Fluoroscopy was utilized by the requesting physician.  No radiographic interpretation.   VAS US  LOWER EXTREMITY VENOUS (DVT) Result Date: 06/20/2023  Lower Venous DVT Study Patient Name:  [name]  Date of Exam:   06/20/2023 Medical Rec #: [id number]       Accession #:    [id number] Date of Birth: 06-10-1940        Patient Gender: F Patient Age:   17 years Exam Location:  [facility] Procedure:      VAS US  LOWER EXTREMITY VENOUS (DVT) Referring Phys: Felton Hough --------------------------------------------------------------------------------  Indications: Stroke.  Comparison Study: NA Performing Technologist: Gelene Kelly RDCS  Examination Guidelines: A complete evaluation includes B-mode imaging, spectral Doppler, color Doppler, and power Doppler as needed of all accessible portions of each vessel. Bilateral testing is considered an integral part of a complete examination. Limited examinations for reoccurring indications may be performed as noted. The reflux portion of the exam is  performed with the patient in reverse Trendelenburg.  +---------+---------------+---------+-----------+----------+--------------+ RIGHT    CompressibilityPhasicitySpontaneityPropertiesThrombus Aging +---------+---------------+---------+-----------+----------+--------------+ CFV      Full           Yes      Yes                                 +---------+---------------+---------+-----------+----------+--------------+ SFJ      Full           Yes      Yes                                 +---------+---------------+---------+-----------+----------+--------------+ FV Prox  Full           Yes      Yes                                 +---------+---------------+---------+-----------+----------+--------------+  FV Mid   Full           Yes      Yes                                 +---------+---------------+---------+-----------+----------+--------------+ FV DistalFull           Yes      Yes                                 +---------+---------------+---------+-----------+----------+--------------+ PFV      Full                                                        +---------+---------------+---------+-----------+----------+--------------+ POP      Full           Yes      Yes                                 +---------+---------------+---------+-----------+----------+--------------+ PTV      Full           Yes      Yes                                 +---------+---------------+---------+-----------+----------+--------------+ PERO     Full           Yes      Yes                                 +---------+---------------+---------+-----------+----------+--------------+ Gastroc  Full                                                        +---------+---------------+---------+-----------+----------+--------------+ GSV      Full           Yes      Yes                                  +---------+---------------+---------+-----------+----------+--------------+   +---------+---------------+---------+-----------+----------+--------------+ LEFT     CompressibilityPhasicitySpontaneityPropertiesThrombus Aging +---------+---------------+---------+-----------+----------+--------------+ CFV      Full           Yes      Yes                                 +---------+---------------+---------+-----------+----------+--------------+ SFJ      Full           Yes      Yes                                 +---------+---------------+---------+-----------+----------+--------------+ FV Prox  Full           Yes  Yes                                 +---------+---------------+---------+-----------+----------+--------------+ FV Mid   Full           Yes      Yes                                 +---------+---------------+---------+-----------+----------+--------------+ FV DistalFull           Yes      Yes                                 +---------+---------------+---------+-----------+----------+--------------+ PFV      Full                                                        +---------+---------------+---------+-----------+----------+--------------+ POP      Full           Yes      Yes                                 +---------+---------------+---------+-----------+----------+--------------+ PTV      Full           Yes      Yes                                 +---------+---------------+---------+-----------+----------+--------------+ PERO     Full           Yes      Yes                                 +---------+---------------+---------+-----------+----------+--------------+ Gastroc  Full                                                        +---------+---------------+---------+-----------+----------+--------------+ GSV      Full           Yes      Yes                                  +---------+---------------+---------+-----------+----------+--------------+     Summary: BILATERAL: - No evidence of deep vein thrombosis seen in the lower extremities, bilaterally. -No evidence of popliteal cyst, bilaterally.   *See table(s) above for measurements and observations. Electronically signed by Irvin Mantel on 06/20/2023 at 7:45:23 PM.    Final    ECHOCARDIOGRAM COMPLETE Result Date: 06/19/2023    ECHOCARDIOGRAM REPORT   Patient Name:   EEVA SCHLOSSER Date of Exam: 06/19/2023 Medical Rec #:  [id number]      Height:       67.0 in Accession #:    [id number]     Weight:  142.0 lb Date of Birth:  November 21, 1940       BSA:          1.748 m Patient Age:    83 years       BP:           140/69 mmHg Patient Gender: F              HR:           78 bpm. Exam Location:  Inpatient Procedure: 2D Echo, Color Doppler, Cardiac Doppler and Intracardiac            Opacification Agent (Both Spectral and Color Flow Doppler were            utilized during procedure). Indications:    Pulmonary Embolus  History:        Patient has prior history of Echocardiogram examinations, most                 recent 10/12/2022. CHF, Arrythmias:Atrial Flutter; Risk                 Factors:Hypertension, Diabetes, Dyslipidemia and Hypothyroidism.                 GERD, Lupus.  Sonographer:    Travis Friedman RDCS Referring Phys: [phone] Regino Caprio PAHWANI IMPRESSIONS  1. Left ventricular ejection fraction, by estimation, is 60 to 65%. The left ventricle has normal function. The left ventricle has no regional wall motion abnormalities. There is moderate asymmetric left ventricular hypertrophy of the basal-septal segment. Left ventricular diastolic parameters are consistent with Grade I diastolic dysfunction (impaired relaxation).  2. Right ventricular systolic function is normal. The right ventricular size is normal. There is mildly elevated pulmonary artery systolic pressure. The estimated right ventricular systolic pressure is 37.6 mmHg.  3. The  mitral valve is degenerative. Thickened leaflets. Trivial mitral valve regurgitation.  4. The aortic valve is tricuspid. Aortic valve regurgitation is not visualized. Aortic valve sclerosis is present, with no evidence of aortic valve stenosis.  5. The inferior vena cava is normal in size with greater than 50% respiratory variability, suggesting right atrial pressure of 3 mmHg. FINDINGS  Left Ventricle: Left ventricular ejection fraction, by estimation, is 60 to 65%. The left ventricle has normal function. The left ventricle has no regional wall motion abnormalities. The left ventricular internal cavity size was normal in size. There is  moderate asymmetric left ventricular hypertrophy of the basal-septal segment. Left ventricular diastolic parameters are consistent with Grade I diastolic dysfunction (impaired relaxation). Right Ventricle: The right ventricular size is normal. No increase in right ventricular wall thickness. Right ventricular systolic function is normal. There is mildly elevated pulmonary artery systolic pressure. The tricuspid regurgitant velocity is 2.94  m/s, and with an assumed right atrial pressure of 3 mmHg, the estimated right ventricular systolic pressure is 37.6 mmHg. Left Atrium: Left atrial size was normal in size. Right Atrium: Right atrial size was normal in size. Pericardium: There is no evidence of pericardial effusion. Mitral Valve: The mitral valve is degenerative in appearance. Trivial mitral valve regurgitation. Tricuspid Valve: The tricuspid valve is normal in structure. Tricuspid valve regurgitation is mild. Aortic Valve: The aortic valve is tricuspid. Aortic valve regurgitation is not visualized. Aortic valve sclerosis is present, with no evidence of aortic valve stenosis. Aortic valve mean gradient measures 3.0 mmHg. Aortic valve peak gradient measures 5.5  mmHg. Aortic valve area, by VTI measures 2.84 cm. Pulmonic Valve: The pulmonic valve was grossly normal. Pulmonic  valve  regurgitation is mild. Aorta: The aortic root and ascending aorta are structurally normal, with no evidence of dilitation. Venous: The inferior vena cava is normal in size with greater than 50% respiratory variability, suggesting right atrial pressure of 3 mmHg. IAS/Shunts: The interatrial septum was not well visualized.  LEFT VENTRICLE PLAX 2D LVIDd:         3.80 cm      Diastology LVIDs:         2.00 cm      LV e' medial:    6.42 cm/s LV PW:         0.90 cm      LV E/e' medial:  12.1 LV IVS:        1.10 cm      LV e' lateral:   8.16 cm/s LVOT diam:     2.00 cm      LV E/e' lateral: 9.5 LV SV:         71 LV SV Index:   40 LVOT Area:     3.14 cm  LV Volumes (MOD) LV vol d, MOD A2C: 79.4 ml LV vol d, MOD A4C: 110.0 ml LV vol s, MOD A2C: 25.9 ml LV vol s, MOD A4C: 36.6 ml LV SV MOD A2C:     53.5 ml LV SV MOD A4C:     110.0 ml LV SV MOD BP:      64.0 ml RIGHT VENTRICLE             IVC RV Basal diam:  3.20 cm     IVC diam: 1.20 cm RV S prime:     16.40 cm/s TAPSE (M-mode): 2.1 cm LEFT ATRIUM             Index        RIGHT ATRIUM           Index LA diam:        3.00 cm 1.72 cm/m   RA Area:     12.00 cm LA Vol (A2C):   46.7 ml 26.71 ml/m  RA Volume:   21.60 ml  12.35 ml/m LA Vol (A4C):   28.6 ml 16.36 ml/m LA Biplane Vol: 37.2 ml 21.28 ml/m  AORTIC VALVE AV Area (Vmax):    2.60 cm AV Area (Vmean):   2.81 cm AV Area (VTI):     2.84 cm AV Vmax:           117.00 cm/s AV Vmean:          79.600 cm/s AV VTI:            0.249 m AV Peak Grad:      5.5 mmHg AV Mean Grad:      3.0 mmHg LVOT Vmax:         96.90 cm/s LVOT Vmean:        71.300 cm/s LVOT VTI:          0.225 m LVOT/AV VTI ratio: 0.90  AORTA Ao Root diam: 3.60 cm Ao Asc diam:  3.10 cm MITRAL VALVE                TRICUSPID VALVE MV Area (PHT): 3.08 cm     TR Peak grad:   34.6 mmHg MV Decel Time: 246 msec     TR Vmax:        294.00 cm/s MV E velocity: 77.60 cm/s MV A velocity: 133.00 cm/s  SHUNTS MV E/A ratio:  0.58  Systemic VTI:  0.22 m                              Systemic Diam: 2.00 cm Carson Clara MD Electronically signed by Carson Clara MD Signature Date/Time: 06/19/2023/1:17:37 PM    Final    CT Angio Chest PE W and/or Wo Contrast Result Date: 06/19/2023 CLINICAL DATA:  Syncope/presyncope. Evaluate for pulmonary embolism. EXAM: CT ANGIOGRAPHY CHEST WITH CONTRAST TECHNIQUE: Multidetector CT imaging of the chest was performed using the standard protocol during bolus administration of intravenous contrast. Multiplanar CT image reconstructions and MIPs were obtained to evaluate the vascular anatomy. RADIATION DOSE REDUCTION: This exam was performed according to the departmental dose-optimization program which includes automated exposure control, adjustment of the mA and/or kV according to patient size and/or use of iterative reconstruction technique. CONTRAST:  75mL ISOVUE -370 IOPAMIDOL  (ISOVUE -370) INJECTION 76% COMPARISON:  10/12/2022 FINDINGS: Cardiovascular: Satisfactory opacification of the pulmonary arteries to the segmental level. There is a suspected focal filling defect within a segmental branch of the left lower lobe pulmonary artery, image 239/7. No additional signs of acute pulmonary embolism identified. Aortic atherosclerosis. Multi vessel coronary artery calcifications. Mediastinum/Nodes: Increased size of mediastinal and bilateral hilar lymph nodes. Pre-vascular lymph node within the anterior mediastinum measures 0.9 cm. Previously 1 cm. Left hilar lymph node measures 1.1 cm, image 29/5. Previously 0.3 cm. Thyroid  gland, trachea, and esophagus are unremarkable. Lungs/Pleura: Signs of chronic interstitial lung disease identified with bilateral peripheral, subpleural interstitial reticulation and honeycombing with a lower lung zone predominance. These findings appear similar to the prior examination. Bilateral heterogeneous ground-glass attenuation is noted within both lower lobes with mild consolidative changes noted in the superior  segment of left lower lobe. There is a masslike density within the left lower lobe which measures 3.1 x 3.3 cm, image 127/6. On the previous exam from 10/22/2022 this measured 2.8 x 2.1 cm. On the examination from 04/26/2022 this area measured 1.5 x 1.5 cm. Upper Abdomen: No acute abnormality. Musculoskeletal: No chest wall abnormality. No acute or significant osseous findings. Review of the MIP images confirms the above findings. IMPRESSION: 1. Filling defect within segmental branch of the left lower lobe pulmonary artery is suspicious for pulmonary embolism. 2. Signs of chronic interstitial lung disease with bilateral peripheral, subpleural interstitial reticulation and honeycombing with a lower lung zone predominance. These findings appear similar to the prior examination. 3. Bilateral heterogeneous ground-glass attenuation is noted within both lower lobes with mild consolidative changes noted in the superior segment of left lower lobe. Although nonspecific these findings may reflect superimposed inflammation or infection versus progressive changes of interstitial lung disease. 4. Increased size of mediastinal and bilateral hilar lymph nodes. These are favored to be reactive in etiology. 5. Enlarging masslike density within the left lower lobe which measures 3.1 x 3.3 cm. On the previous exam from 10/22/2022 this measured 2.8 x 2.1 cm. On the examination from 04/26/2022 this area measured 1.5 x 1.5 cm. Findings are concerning for enlarging neoplasm. Recommend referral to pulmonary medicine or thoracic surgery for further management. 6. Coronary artery calcifications. 7.  Aortic Atherosclerosis (ICD10-I70.0). Critical Value/emergent results were called by telephone at the time of interpretation on 06/19/2023 at 8:34 am to provider Davene Ernst, who verbally acknowledged these results. Choose 1 Electronically Signed   By: Kimberley Penman M.D.   On: 06/19/2023 08:34   CT Head Wo Contrast Result Date:  06/19/2023 CLINICAL DATA:  Blunt poly trauma  due to fall. EXAM: CT HEAD WITHOUT CONTRAST CT CERVICAL SPINE WITHOUT CONTRAST TECHNIQUE: Multidetector CT imaging of the head and cervical spine was performed following the standard protocol without intravenous contrast. Multiplanar CT image reconstructions of the cervical spine were also generated. RADIATION DOSE REDUCTION: This exam was performed according to the departmental dose-optimization program which includes automated exposure control, adjustment of the mA and/or kV according to patient size and/or use of iterative reconstruction technique. COMPARISON:  04/26/2022 head CT FINDINGS: CT HEAD FINDINGS Brain: No evidence of acute infarction, hemorrhage, hydrocephalus, extra-axial collection or mass lesion/mass effect. Cerebral volume loss in keeping with age Vascular: No hyperdense vessel or unexpected calcification. Skull: Normal. Negative for fracture or focal lesion. Sinuses/Orbits: No acute finding. CT CERVICAL SPINE FINDINGS Alignment: No traumatic malalignment Skull base and vertebrae: No acute fracture. No primary bone lesion or focal pathologic process. Soft tissues and spinal canal: No prevertebral fluid or swelling. No visible canal hematoma. Disc levels: Generalized degenerative endplate spurring with underlying protrusions, especially bulky endplate spurring at C3-4 to C6-7. Upper chest: Negative IMPRESSION: No evidence of acute intracranial or cervical spine injury. Electronically Signed   By: Ronnette Coke M.D.   On: 06/19/2023 05:28   CT Cervical Spine Wo Contrast Result Date: 06/19/2023 CLINICAL DATA:  Blunt poly trauma due to fall. EXAM: CT HEAD WITHOUT CONTRAST CT CERVICAL SPINE WITHOUT CONTRAST TECHNIQUE: Multidetector CT imaging of the head and cervical spine was performed following the standard protocol without intravenous contrast. Multiplanar CT image reconstructions of the cervical spine were also generated. RADIATION DOSE REDUCTION:  This exam was performed according to the departmental dose-optimization program which includes automated exposure control, adjustment of the mA and/or kV according to patient size and/or use of iterative reconstruction technique. COMPARISON:  04/26/2022 head CT FINDINGS: CT HEAD FINDINGS Brain: No evidence of acute infarction, hemorrhage, hydrocephalus, extra-axial collection or mass lesion/mass effect. Cerebral volume loss in keeping with age Vascular: No hyperdense vessel or unexpected calcification. Skull: Normal. Negative for fracture or focal lesion. Sinuses/Orbits: No acute finding. CT CERVICAL SPINE FINDINGS Alignment: No traumatic malalignment Skull base and vertebrae: No acute fracture. No primary bone lesion or focal pathologic process. Soft tissues and spinal canal: No prevertebral fluid or swelling. No visible canal hematoma. Disc levels: Generalized degenerative endplate spurring with underlying protrusions, especially bulky endplate spurring at C3-4 to C6-7. Upper chest: Negative IMPRESSION: No evidence of acute intracranial or cervical spine injury. Electronically Signed   By: Ronnette Coke M.D.   On: 06/19/2023 05:28   DG Chest Portable 1 View Result Date: 06/19/2023 CLINICAL DATA:  Status post fall 2 days ago. EXAM: PORTABLE CHEST 1 VIEW COMPARISON:  October 11, 2022 FINDINGS: The cardiac silhouette is mildly enlarged and unchanged in size. Mild atelectatic changes are seen within the bilateral lung bases. No pleural effusion or pneumothorax is identified. A cluster of numerous radiopaque pellet like foreign bodies and adjacent larger, round, less radiopaque foreign bodies are seen overlying the left lung base. Degenerative changes seen throughout the thoracic spine. IMPRESSION: 1. Stable cardiomegaly with mild bibasilar atelectasis. 2. Multiple radiopaque foreign bodies overlying the left lung base. This may be postoperative in origin. Correlation with physical examination and the patient's  surgical history is recommended Electronically Signed   By: Virgle Grime M.D.   On: 06/19/2023 03:50     Past medical hx Past Medical History:  Diagnosis Date   Acute bronchitis 12/17/2021   Acute cystitis 02/20/2019   Arthritis    Atrial flutter (  HCC)    Breast cancer (HCC)    left  2009 and 2025   Diabetes mellitus without complication (HCC)    Dyspnea    mostly with exertion   GERD (gastroesophageal reflux disease)    Hypertension    Hypothyroidism    Libman Sacks endocarditis (HCC)    Lupus    Pneumonia 07/31/2021     Social History   Tobacco Use   Smoking status: Never   Smokeless tobacco: Current    Types: Chew  Vaping Use   Vaping status: Never Used  Substance Use Topics   Alcohol  use: No   Drug use: No    Ms.Fodge reports that she has never smoked. Her smokeless tobacco use includes chew. She reports that she does not drink alcohol  and does not use drugs.  Tobacco Cessation: Ready to quit: Not Answered Counseling given: Not Answered Never smoker Chews tobacco  Past surgical hx, Family hx, Social hx all reviewed.  Current Outpatient Medications on File Prior to Visit  Medication Sig   albuterol  (ACCUNEB ) 0.63 MG/3ML nebulizer solution Take 3 mLs (0.63 mg total) by nebulization every 6 (six) hours as needed for wheezing.   albuterol  (VENTOLIN  HFA) 108 (90 Base) MCG/ACT inhaler Inhale 1-2 puffs into the lungs every 6 (six) hours as needed for wheezing or shortness of breath.   anastrozole  (ARIMIDEX ) 1 MG tablet Take 1 tablet (1 mg total) by mouth daily.   apixaban  (ELIQUIS ) 5 MG TABS tablet Take 1 tablet (5 mg total) by mouth 2 (two) times daily. Okay to restart on 06/30/2023   diclofenac  Sodium (VOLTAREN ) 1 % GEL Apply 1 application. topically 4 (four) times daily as needed (pain).   diltiazem  (CARDIZEM  CD) 120 MG 24 hr capsule Take 1 capsule (120 mg total) by mouth daily.   gabapentin  (NEURONTIN ) 300 MG capsule Take 300 mg by mouth 3 (three) times  daily.   glimepiride  (AMARYL ) 1 MG tablet Take 1 mg by mouth daily with breakfast.   hydroxychloroquine  (PLAQUENIL ) 200 MG tablet Take 200 mg by mouth daily.   levothyroxine  (SYNTHROID ) 100 MCG tablet Take 100 mcg by mouth daily.   omeprazole (PRILOSEC) 20 MG capsule Take 20 mg by mouth daily as needed (Indigestion).   predniSONE  (DELTASONE ) 20 MG tablet Take 1 tablet (20 mg total) by mouth daily with breakfast.   promethazine -dextromethorphan  (PROMETHAZINE -DM) 6.25-15 MG/5ML syrup Take 5 mLs by mouth 4 (four) times daily as needed for cough. (Patient not taking: Reported on 06/19/2023)   spironolactone  (ALDACTONE ) 25 MG tablet Take 0.5 tablets (12.5 mg total) by mouth daily.   [DISCONTINUED] Tiotropium Bromide  Monohydrate (SPIRIVA  RESPIMAT) 1.25 MCG/ACT AERS Inhale 2 puffs into the lungs daily. (Patient not taking: Reported on 09/15/2019)   No current facility-administered medications on file prior to visit.     Allergies  Allergen Reactions   Ciprofloxacin Itching, Swelling and Other (See Comments)    Possibly causing tremors?   Crestor  [Rosuvastatin ] Other (See Comments)    Myalgia and back pain   Lisinopril  Cough   Ofev  [Nintedanib] Nausea Only    Review Of Systems:  Constitutional:   No  weight loss, night sweats,  Fevers, chills, fatigue, or  lassitude.  HEENT:   No headaches,  Difficulty swallowing,  Tooth/dental problems, or  Sore throat,                No sneezing, itching, ear ache, nasal congestion, post nasal drip,   CV:  No chest pain,  Orthopnea, PND, swelling in  lower extremities, anasarca, dizziness, palpitations, syncope.   GI  No heartburn, indigestion, abdominal pain, nausea, vomiting, diarrhea, change in bowel habits, loss of appetite, bloody stools.   Resp: No shortness of breath with exertion or at rest.  No excess mucus, no productive cough,  No non-productive cough,  No coughing up of blood.  No change in color of mucus.  No wheezing.  No chest wall  deformity  Skin: no rash or lesions.  GU: no dysuria, change in color of urine, no urgency or frequency.  No flank pain, no hematuria   MS:  No joint pain or swelling.  No decreased range of motion.  No back pain.  Psych:  No change in mood or affect. No depression or anxiety.  No memory loss.   Vital Signs There were no vitals taken for this visit.   Physical Exam:  General- No distress,  A&Ox3, pleasant  ENT: No sinus tenderness, TM clear, pale nasal mucosa, no oral exudate,no post nasal drip, no LAN Cardiac: S1, S2, regular rate and rhythm, no murmur Chest: No wheeze/ rales/ dullness; no accessory muscle use, no nasal flaring, no sternal retractions, diminished per bases Abd.: Soft Non-tender, ND, BS +, Body mass index is 22.52 kg/m.  Ext: No clubbing cyanosis, edema, no obvious deformities Neuro:  normal strength, MAE x 4, A&O x 3, appropriate Skin: No rashes, warm and dry, no obvious deformities Psych: normal mood and behavior   Assessment/Plan Post bronchoscopy with biopsies New diagnosis Adenocarcinoma History of breast cancer, bilateral Never smoker  Plan I am glad you have done well since you had the bronchoscopy with biopsies. We have reviewed the results of your biopsies. The biopsy of the left lower lobe of your lung was positive for lung cancer. 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 There were also malignant cells noted in 3 of your lymph nodes on the left. I have referred you to medical oncology and also to radiation o PET scan and MRI Brain have been ordered.  You will get calls to get these scheduled.  I have renewed your albuterol  inhaler as well as your albuterol  nebulizer treatments. [facility] Please follow-up with [name] at his earliest appointment as follow-up for your interstitial lung disease. If you continue to have worsening shortness of breath please return to the office so  we can determine if you need oxygen . Please call if you need anything. They will take great care of you at the cancer center. Good luck with your treatment. Please contact office for sooner follow up if symptoms do not improve or worsen or seek emergency care    I spent 40 minutes dedicated to the care of this patient on the date of this encounter to include pre-visit review of records, face-to-face time with the patient discussing conditions above, post visit ordering of testing, clinical documentation with the electronic health record, making appropriate referrals as documented, and communicating necessary information to the patient's healthcare team.   Raejean Bullock, NP 07/08/2023  10:26 AM

## 2023-07-08 NOTE — Progress Notes (Signed)
 Request for pt's tissue be sent to [facility] medicine for molecular testing and PD-L1 emailed to Ernie Heal, [facility] path tech.

## 2023-07-09 ENCOUNTER — Other Ambulatory Visit: Payer: Self-pay

## 2023-07-09 ENCOUNTER — Telehealth: Payer: Self-pay

## 2023-07-09 ENCOUNTER — Ambulatory Visit (INDEPENDENT_AMBULATORY_CARE_PROVIDER_SITE_OTHER): Admitting: Acute Care

## 2023-07-09 ENCOUNTER — Encounter: Payer: Self-pay | Admitting: Acute Care

## 2023-07-09 VITALS — BP 129/70 | HR 83 | Ht 67.0 in | Wt 143.8 lb

## 2023-07-09 DIAGNOSIS — C3490 Malignant neoplasm of unspecified part of unspecified bronchus or lung: Secondary | ICD-10-CM

## 2023-07-09 DIAGNOSIS — J849 Interstitial pulmonary disease, unspecified: Secondary | ICD-10-CM | POA: Diagnosis not present

## 2023-07-09 DIAGNOSIS — Z853 Personal history of malignant neoplasm of breast: Secondary | ICD-10-CM | POA: Diagnosis not present

## 2023-07-09 DIAGNOSIS — C50512 Malignant neoplasm of lower-outer quadrant of left female breast: Secondary | ICD-10-CM

## 2023-07-09 DIAGNOSIS — R0600 Dyspnea, unspecified: Secondary | ICD-10-CM

## 2023-07-09 MED ORDER — ALBUTEROL SULFATE HFA 108 (90 BASE) MCG/ACT IN AERS: 2.0000 | INHALATION_SPRAY | Freq: Four times a day (QID) | RESPIRATORY_TRACT | 2 refills | Status: DC | PRN

## 2023-07-09 MED ORDER — ALBUTEROL SULFATE 0.63 MG/3ML IN NEBU
1.0000 | INHALATION_SOLUTION | Freq: Four times a day (QID) | RESPIRATORY_TRACT | 12 refills | Status: DC | PRN
Start: 2023-07-09 — End: 2023-12-09

## 2023-07-09 NOTE — Progress Notes (Signed)
 The proposed treatment discussed in conference is for discussion purpose only and is not a binding recommendation.  The patients have not been physically examined, or presented with their treatment options.  Therefore, final treatment plans cannot be decided.

## 2023-07-09 NOTE — Patient Instructions (Addendum)
 It is good to see you today. I am glad you have done well since you had the bronchoscopy with biopsies. We have reviewed the results of your biopsies. The biopsy of the left lower lobe of your lung was positive for lung cancer. 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 There were also malignant cells noted in 3 of your lymph nodes on the left. I have referred you to medical oncology and also to radiation o PET scan and MRI Brain have been ordered.  You will get calls to get these scheduled.  I have renewed your albuterol  inhaler as well as your albuterol  nebulizer treatments. [facility]. Please follow-up with [name] at his earliest appointment as follow-up for your interstitial lung disease. If you continue to have worsening shortness of breath please return to the office so we can determine if you need oxygen . Please call if you need anything. They will take great care of you at the cancer center. Good luck with your treatment. Please contact office for sooner follow up if symptoms do not improve or worsen or seek emergency care

## 2023-07-09 NOTE — Telephone Encounter (Signed)
 S/w pt's daughter, [name] to set up CT MRI and MD f/u. CT and MRI are scheduled for 6/19. MD f/u scheduled for 6/26 at 1115 to review results. [name] is agreeable and will let pt know.

## 2023-07-12 ENCOUNTER — Inpatient Hospital Stay: Admitting: Adult Health

## 2023-07-14 DIAGNOSIS — C3490 Malignant neoplasm of unspecified part of unspecified bronchus or lung: Secondary | ICD-10-CM | POA: Diagnosis not present

## 2023-07-15 ENCOUNTER — Ambulatory Visit (HOSPITAL_COMMUNITY)
Admission: RE | Admit: 2023-07-15 | Discharge: 2023-07-15 | Disposition: A | Discharge status: Home / Self Care | Source: Ambulatory Visit | Attending: Hematology and Oncology | Admitting: Hematology and Oncology

## 2023-07-15 ENCOUNTER — Encounter (HOSPITAL_COMMUNITY): Payer: Self-pay

## 2023-07-15 ENCOUNTER — Ambulatory Visit (HOSPITAL_COMMUNITY)
Admission: RE | Admit: 2023-07-15 | Discharge: 2023-07-15 | Disposition: A | Discharge status: Home / Self Care | Source: Ambulatory Visit | Attending: Hematology and Oncology

## 2023-07-15 DIAGNOSIS — C3490 Malignant neoplasm of unspecified part of unspecified bronchus or lung: Secondary | ICD-10-CM | POA: Diagnosis not present

## 2023-07-15 DIAGNOSIS — Z170 Estrogen receptor positive status [ER+]: Secondary | ICD-10-CM

## 2023-07-15 DIAGNOSIS — J849 Interstitial pulmonary disease, unspecified: Secondary | ICD-10-CM

## 2023-07-15 DIAGNOSIS — R9082 White matter disease, unspecified: Secondary | ICD-10-CM | POA: Diagnosis not present

## 2023-07-15 DIAGNOSIS — C801 Malignant (primary) neoplasm, unspecified: Secondary | ICD-10-CM | POA: Diagnosis not present

## 2023-07-15 DIAGNOSIS — C50919 Malignant neoplasm of unspecified site of unspecified female breast: Secondary | ICD-10-CM | POA: Diagnosis not present

## 2023-07-15 DIAGNOSIS — C50512 Malignant neoplasm of lower-outer quadrant of left female breast: Secondary | ICD-10-CM

## 2023-07-15 DIAGNOSIS — R918 Other nonspecific abnormal finding of lung field: Secondary | ICD-10-CM | POA: Diagnosis not present

## 2023-07-15 LAB — GLUCOSE, CAPILLARY: Glucose-Capillary: 104 mg/dL — ABNORMAL HIGH (ref 70–99)

## 2023-07-15 MED ORDER — FLUDEOXYGLUCOSE F - 18 (FDG) INJECTION
7.0600 | Freq: Once | INTRAVENOUS | Status: AC | PRN
  Administered 2023-07-15: 7.06 via INTRAVENOUS

## 2023-07-15 MED ORDER — GADOBUTROL 1 MMOL/ML IV SOLN
6.0000 mL | Freq: Once | INTRAVENOUS | Status: AC | PRN
Start: 2023-07-15 — End: 2023-07-15

## 2023-07-16 NOTE — Progress Notes (Signed)
 I reached out to the pt to make self-introduction. Pt did not answer her mobile phone. I then reached out to pt's dtr, [name]. I introduced myself and explained my role as the pts nurse navigator. One of the needs the pt may need per the pts dtr, is transportation. [name] states that she doesn't drive, but her daughter does, but it not always available. I let [name] know that I will be at the pts appt on 6/26 and can better assess her needs in person. [name] verbalized understanding.

## 2023-07-20 DIAGNOSIS — R1310 Dysphagia, unspecified: Secondary | ICD-10-CM | POA: Diagnosis not present

## 2023-07-20 DIAGNOSIS — I4892 Unspecified atrial flutter: Secondary | ICD-10-CM | POA: Diagnosis not present

## 2023-07-20 DIAGNOSIS — Z7901 Long term (current) use of anticoagulants: Secondary | ICD-10-CM | POA: Diagnosis not present

## 2023-07-20 NOTE — Progress Notes (Incomplete)
 Location of tumor and Histology per Pathology Report: ***  Biopsy:     Past/Anticipated interventions by surgeon, if any: left    Past/Anticipated interventions by medical oncology, if any: Patient to see Dr. Tressia on 07/22/23   Pain issues, if any:  {:18581} {PAIN DESCRIPTION:21022940}  SAFETY ISSUES: Prior radiation? {:18581} Pacemaker/ICD? {:18581} Possible current pregnancy? no Is the patient on methotrexate? {:18581}  Current Complaints / other details:  ***     ***

## 2023-07-21 ENCOUNTER — Telehealth: Payer: Self-pay

## 2023-07-21 NOTE — Progress Notes (Signed)
 I called the reading room to have pt's PET scan read in anticipation of her appt with Dr.[name] on 6/26 at 11:15. Spoke with Randine. Randine states she will make a note to have it read urgently.

## 2023-07-21 NOTE — Telephone Encounter (Signed)
 Tried to call to confirm appt for 6/26 but mailbox was full. Was unable to leave a voicemail.

## 2023-07-21 NOTE — Progress Notes (Incomplete)
 Radiation Oncology         [phone] ________________________________  Initial Outpatient Consultation Note  Name: [name] MRN: [id number]  Date: 07/22/2023  DOB: 1940/07/24  RR:Dfpuy, [place], MD  [name], MD   REFERRING PHYSICIAN: [name], MD  DIAGNOSIS: The encounter diagnosis was Malignant neoplasm of lower lobe bronchus, left (HCC).  Stage IIIA (cT2a, N2, M0) adenocarcinoma, non-small cell lung cancer, of the LLL  History of left sided breast cancer treated with surgery and adjuvant radiation in 2010.   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 female who is accompanied by her supportive daughter. she is seen as a courtesy of Dr. Lamar Shelah for an opinion concerning radiation therapy as part of management for her recently diagnosed lung cancer. She has a history of using chewing tobacco but denies smoking cigarettes   The patient presented to the ED on 06/19/23 with complains of dyspnea.  She underwent several testing including a CTA which showed possible segmental  branch of the left lower lobe pulmonary artery. Scan also showed an enlarging left lower lobe masslike consolidation measuring 3.1 x 3.3 cm compared to 1.5 x 1.5 cm on previous exam.   To further investigate findings, she then underwent a video bronchoscopy with endobronchial navigation on 06/29/23 under the care of Dr. Shelah. Fine needle aspiration biopsy indicated adenocarcinoma in the left lung. The tumor cells are TTF-1 positive and p40 negative with immunohistochemical (IHC) staining, supportive of adenocarcinoma. Malignant cells present in multiple lymph nodes.   Subsequently, she was referred to NP Groce on 07/09/23 to discuss treatment plan. For staging purposes, she underwent a brain MRI on 07/15/23 showing no evidence of metastatic  disease. PET scan indicating that the left lower lobe lung nodules hypermetabolic in there are several hypermetabolic left hilar and left-sided mediastinal nodes. Additional subcarinal hypermetabolic node consistent with spread of disease.  Patient met with Dr. Loretha earlier today to discuss systemic treatment options.  Per her recommendations, the patient will proceed with targeted therapy testing to see if she is a candidate for immunotherapy.  She is not a candidate for immunotherapy alone, Dr. Loretha will consider sequential chemotherapy followed by radiation.  PREVIOUS RADIATION THERAPY: yes,  she was treated with partial breast radiation using the mammosite system directed at the lumpectomy cavity in the left breast.. Treatments were in [place] several years ago.  PAST MEDICAL HISTORY:  Past Medical History:  Diagnosis Date   Acute bronchitis 12/17/2021   Acute cystitis 02/20/2019   Arthritis    Atrial flutter (HCC)    Breast cancer (HCC)    left  2009 and 2025   Diabetes mellitus without complication (HCC)    Dyspnea    mostly with exertion   GERD (gastroesophageal reflux disease)    Hypertension    Hypothyroidism    Libman Sacks endocarditis (HCC)    Lupus    Pneumonia 07/31/2021    PAST SURGICAL HISTORY: Past Surgical History:  Procedure Laterality Date   BREAST BIOPSY Left 01/15/2023   US  LT BREAST BX W LOC DEV 1ST LESION IMG BX SPEC US  GUIDE 01/15/2023 [facility]   BREAST LUMPECTOMY Left 2009   BRONCHIAL BIOPSY  06/29/2023   Procedure: BRONCHOSCOPY, WITH BIOPSY;  Surgeon: [name], MD;  Location: [facility];  Service: Pulmonary;;   BRONCHIAL  BRUSHINGS  06/29/2023   Procedure: BRONCHOSCOPY, WITH BRUSH BIOPSY;  Surgeon: [name], MD;  Location: [facility];  Service: Pulmonary;;   BRONCHIAL NEEDLE ASPIRATION BIOPSY  06/29/2023   Procedure: BRONCHOSCOPY, WITH NEEDLE ASPIRATION BIOPSY;  Surgeon: [name], MD;  Location: [facility];   Service: Pulmonary;;   IR 3D INDEPENDENT WKST  05/29/2021   IR ANGIO INTRA EXTRACRAN SEL COM CAROTID INNOMINATE BILAT MOD SED  05/29/2021   IR ANGIO VERTEBRAL SEL VERTEBRAL BILAT MOD SED  05/29/2021   SIMPLE MASTECTOMY WITH AXILLARY SENTINEL NODE BIOPSY Left 03/31/2023   Procedure: LEFT MASTECTOMY;  Surgeon: Vernetta Berg, MD;  [facility];  Service: General;  Laterality: Left;  LMA PEC BLOCK   VIDEO BRONCHOSCOPY WITH ENDOBRONCHIAL NAVIGATION Left 06/29/2023   Procedure: VIDEO BRONCHOSCOPY WITH ENDOBRONCHIAL NAVIGATION;  Surgeon: [name], MD;  Location: [facility];  Service: Pulmonary;  Laterality: Left;   VIDEO BRONCHOSCOPY WITH ENDOBRONCHIAL ULTRASOUND Bilateral 06/29/2023   Procedure: BRONCHOSCOPY, WITH EBUS;  Surgeon: [name], MD;  Location[facility] END[facility]COPY;  Service: Pulmonary;  Laterality: Bilateral;    FAMILY HISTORY:  Family History  Problem Relation Age of Onset   Cancer Father        prostate   Prostate cancer Father 30 - 11       metaststic   Asthma Brother    Cancer Daughter        breast   Breast cancer Daughter 74    SOCIAL HISTORY:  Social History   Tobacco Use   Smoking status: Never    Passive exposure: Never   Smokeless tobacco: Current    Types: Chew  Vaping Use   Vaping status: Never Used  Substance Use Topics   Alcohol  use: No   Drug use: No    ALLERGIES:  Allergies  Allergen Reactions   Ciprofloxacin Itching, Swelling and Other (See Comments)    Possibly causing tremors?   Crestor  [Rosuvastatin ] Other (See Comments)    Myalgia and back pain   Lisinopril  Cough   Ofev  [Nintedanib] Nausea Only    MEDICATIONS:  Current Outpatient Medications  Medication Sig Dispense Refill   albuterol  (ACCUNEB ) 0.63 MG/3ML nebulizer solution Take 3 mLs (0.63 mg total) by nebulization every 6 (six) hours as needed for wheezing. 75 mL 12   albuterol  (VENTOLIN  HFA) 108 (90 Base) MCG/ACT inhaler Inhale 2 puffs into the lungs every 6 (six) hours as  needed for wheezing or shortness of breath. 8.5 g 2   anastrozole  (ARIMIDEX ) 1 MG tablet Take 1 tablet (1 mg total) by mouth daily. 90 tablet 3   apixaban  (ELIQUIS ) 5 MG TABS tablet Take 1 tablet (5 mg total) by mouth 2 (two) times daily. Okay to restart on 06/30/2023     diclofenac  Sodium (VOLTAREN ) 1 % GEL Apply 1 application. topically 4 (four) times daily as needed (pain).     diltiazem  (CARDIZEM  CD) 120 MG 24 hr capsule Take 1 capsule (120 mg total) by mouth daily. 90 capsule 3   gabapentin  (NEURONTIN ) 300 MG capsule Take 300 mg by mouth 3 (three) times daily.     glimepiride  (AMARYL ) 1 MG tablet Take 1 mg by mouth daily with breakfast.     hydroxychloroquine  (PLAQUENIL ) 200 MG tablet Take 200 mg by mouth daily.     levothyroxine  (SYNTHROID ) 100 MCG tablet Take 100 mcg by mouth daily.     omeprazole (PRILOSEC) 20 MG capsule Take 20 mg by mouth daily as needed (Indigestion).  predniSONE  (DELTASONE ) 20 MG tablet Take 1 tablet (20 mg total) by mouth daily with breakfast. 4 tablet 0   promethazine -dextromethorphan  (PROMETHAZINE -DM) 6.25-15 MG/5ML syrup Take 5 mLs by mouth 4 (four) times daily as needed for cough. (Patient not taking: Reported on 07/09/2023) 118 mL 0   spironolactone  (ALDACTONE ) 25 MG tablet Take 0.5 tablets (12.5 mg total) by mouth daily. 45 tablet 3   No current facility-administered medications for this encounter.    REVIEW OF SYSTEMS: Patient reports feeling well overall today.  She notes some dyspnea on exertion, but otherwise denies any cough, hemoptysis, chest pain, or any other changes to her breathing.   PHYSICAL EXAM:  height is 5' 7 (1.702 m) and weight is 140 lb 12.8 oz (63.9 kg). Her temporal temperature is 98.4 F (36.9 C). Her blood pressure is 132/62 and her pulse is 84. Her respiration is 18 and oxygen  saturation is 97%.   General: Alert and oriented, in no acute distress HEENT: Head is normocephalic. Extraocular movements are intact.  Neck: Neck is supple,  no palpable cervical or supraclavicular lymphadenopathy. Heart: Regular in rate and rhythm with no murmurs, rubs, or gallops. Chest: Clear to auscultation bilaterally, with no rhonchi, wheezes, or rales. Abdomen: Soft, nontender, nondistended, with no rigidity or guarding. Extremities: No cyanosis or edema. Lymphatics: see Neck Exam Skin: No concerning lesions. Musculoskeletal: symmetric strength and muscle tone throughout. Neurologic: Cranial nerves II through XII are grossly intact. No obvious focalities. Speech is fluent. Coordination is intact. Psychiatric: Judgment and insight are intact. Affect is appropriate.   ECOG = 0  0 - Asymptomatic (Fully active, able to carry on all predisease activities without restriction)  1 - Symptomatic but completely ambulatory (Restricted in physically strenuous activity but ambulatory and able to carry out work of a light or sedentary nature. For example, light housework, office work)  2 - Symptomatic, <50% in bed during the day (Ambulatory and capable of all self care but unable to carry out any work activities. Up and about more than 50% of waking hours)  3 - Symptomatic, >50% in bed, but not bedbound (Capable of only limited self-care, confined to bed or chair 50% or more of waking hours)  4 - Bedbound (Completely disabled. Cannot carry on any self-care. Totally confined to bed or chair)  5 - Death   Raylene MM, Creech RH, Tormey DC, et al. [phone]). Toxicity and response criteria of the [facility] [employer]. Am. DOROTHA Bridges. Oncol. 5 (6): 649-55  LABORATORY DATA:  Lab Results  Component Value Date   WBC 12.9 (H) 06/20/2023   HGB 11.0 (L) 06/20/2023   HCT 34.1 (L) 06/20/2023   MCV 88.8 06/20/2023   PLT 276 06/20/2023   NEUTROABS 10.6 (H) 06/19/2023   Lab Results  Component Value Date   NA 134 (L) 06/29/2023   K 3.9 06/29/2023   CL 99 06/29/2023   CO2 24 06/29/2023   GLUCOSE 135 (H) 06/29/2023   BUN 12 06/29/2023    CREATININE 0.95 06/29/2023   CALCIUM  8.9 06/29/2023      RADIOGRAPHY: NM PET Image Initial (PI) Skull Base To Thigh Result Date: 07/21/2023 CLINICAL DATA:  Subsequent treatment strategy for breast cancer. EXAM: NUCLEAR MEDICINE PET SKULL BASE TO THIGH TECHNIQUE: 7.06 mCi F-18 FDG was injected intravenously. Full-ring PET imaging was performed from the skull base to thigh after the radiotracer. CT data was obtained and used for attenuation correction and anatomic localization. Fasting blood glucose: 104 mg/dl COMPARISON:  CTA chest  06/19/23. FINDINGS: Mediastinal blood pool activity: SUV max 2.0 Liver activity: SUV max 3.1 NECK: No specific abnormal uptake seen in the neck including along lymph node change of the submandibular, posterior triangle or internal jugular region. Incidental CT findings: There is some speckled calcifications along the parotid glands bilaterally. The thyroid  gland is small. Submandibular glands are small. Patient is kyphotic. Scattered vascular calcifications. Visualized portions of the paranasal sinuses and mastoid air cells are clear. CHEST: The prior CT scan described multiple enlarged prevascular mediastinal and left hilar lymph nodes. Many of these are hypermetabolic. Example towards the AP window has maximum SUV value of 9.2 on image 49. Left hilar focus on image 62 has maximum SUV value of 7.8. Subcarinal node has maximum SUV value of 7.5. No additional abnormal uptake above blood pool in the axillary region or right hilum. The left lower lobe lung mass which measured 3.1 x 3.3 cm on the prior CT, today has maximum SUV value of 19.2 and dimension on image 72 measuring 4.0 by 2.9 cm. No additional areas of abnormal lung uptake. Incidental CT findings: Scattered vascular calcifications identified. Trace pericardial fluid. Slightly patulous thoracic esophagus. Previous left mastectomy. Breathing motion. Areas of scarring and fibrotic changes along the lungs. No pleural effusion.  ABDOMEN/PELVIS: There is physiologic distribution radiotracer along the parenchymal organs, bowel and renal collecting systems. No abnormal nodal uptake. Incidental CT findings: With the limits of this attenuation correction noncontrast CT, the liver, spleen, adrenal glands are unremarkable. Mild atrophy of the pancreas. Stones in the gallbladder. No renal or ureteral stones. Preserved contour to the urinary bladder. There is some density in the renal collecting systems and bladder, likely to the contrast for the previous MRI of the head. Diffuse vascular calcifications. Large bowel has a normal course and caliber with scattered stool. Sigmoid colon diverticula. Small bowel is nondilated. Underdistended urinary bladder with some questionable fold thickening. No significant abnormal uptake along the stomach. SKELETON: No abnormal uptake along the visualized osseous structures. Incidental CT findings: Curvature of the spine. Scattered degenerative changes. IMPRESSION: Left lower lobe lung nodules hypermetabolic in there are several hypermetabolic left hilar and left-sided mediastinal nodes. Additional subcarinal hypermetabolic node consistent with spread of disease. This could be metastatic from the patient's history of breast cancer versus a separate lung primary. Please correlate with clinical history and further workup. Gallstones.  Colonic diverticula.  Chronic lung changes. Electronically Signed   By: Ranell Bring M.D.   On: 07/21/2023 16:20   MR Brain W Wo Contrast Result Date: 07/15/2023 EXAM DESCRIPTION: MR BRAIN W WO CONTRAST CLINICAL HISTORY: metastatic disease eval COMPARISON: None available TECHNIQUE: Non contrast MRI of the brain is performed according to our usual protocol including multiplanar multi sequence technique. FINDINGS: No acute intracranial hemorrhage, mass, edema, or hydrocephalus. No areas of abnormal enhancement mild cortical atrophy. No encephalomalacia. Mild white matter disease  suggesting chronic small vessel ischemic change. The vascular flow voids are unremarkable. No significant sinus disease. IMPRESSION: No metastatic disease. Mild age-related change. Electronically signed by: Reyes Frees MD 07/15/2023 12:10 PM EDT RP Workstation: [id number]   DG Chest Port 1 View Result Date: 06/29/2023 CLINICAL DATA:  Status post bronchoscopy EXAM: PORTABLE CHEST 1 VIEW COMPARISON:  Jun 19, 2023 FINDINGS: Ill-defined bilateral basilar infiltrates or atelectasis or combination of both without consolidations No pneumothorax Heart and mediastinum normal IMPRESSION: Ill-defined bilateral basilar infiltrates or atelectasis or combination of both without consolidations. Post bronchoscopy. No pneumothorax Electronically Signed   By: Franky Tor HERO.D.  On: 06/29/2023 14:08   DG C-ARM BRONCHOSCOPY Result Date: 06/29/2023 C-ARM BRONCHOSCOPY: Fluoroscopy was utilized by the requesting physician.  No radiographic interpretation.      IMPRESSION: Stage IIIA (cT2a, N2, M0) adenocarcinoma, non-small cell lung cancer, of the LLL  We reviewed this patient's current work-up. She presents with a LLL mass with hypermetabolic mediastinal lymphadenopathy, biopsy proven adenocarcinoma.  It is unlikely that this is metastatic disease from her breast primary, ER and PR testing have been added to the lung biopsy to confirm. She has met with Dr. Loretha earlier today who recommended foundation testing to see if she is a candidate for immunotherapy alone.  If she is not a candidate, Dr. Loretha recommends sequential chemotherapy followed by radiation treatment.   Dr. Shannon is in agreement with a 6.5-week course of radiation to the hypermetabolic left lower lobe mass and mediastinal adenopathy, if she does not receive immunotherapy.   Today, I talked to the patient and family about the findings and work-up thus far.  We discussed the natural history of stage III non-small cell lung cancer and general treatment,  highlighting the role of radiotherapy in the management.  We discussed the available radiation techniques, and focused on the details of logistics and delivery.  We reviewed the anticipated acute and late sequelae associated with radiation in this setting.  The patient was encouraged to ask questions that I answered to the best of my ability.   PLAN: We will await the results of Foundation testing. If she is not a candidate for immunotherapy or targeted therapy, we will see her after she receives chemotherapy for radiation treatment planning. Anticipate 33 fractions directed at the LLL and hypermetabolic mediastinal adenopathy.     60 minutes of total time was spent for this patient encounter, including preparation, face-to-face counseling with the patient and coordination of care, physical exam, and documentation of the encounter.   ------------------------------------------------   Leeroy Due, PA-C   Lynwood CHARM Shannon, PhD, MD   [facility]  Radiation Oncology Direct Dial: [phone]  Fax: [phone] [email or web address]    This document serves as a record of services personally performed by Lynwood Shannon, MD. It was created on his behalf by Reymundo Cartwright, a trained medical scribe. The creation of this record is based on the scribe's personal observations and the provider's statements to them. This document has been checked and approved by the attending provider.

## 2023-07-22 ENCOUNTER — Ambulatory Visit
Admission: RE | Admit: 2023-07-22 | Discharge: 2023-07-22 | Disposition: A | Discharge status: Home / Self Care | Source: Ambulatory Visit | Attending: Radiation Oncology | Admitting: Radiation Oncology

## 2023-07-22 ENCOUNTER — Encounter (HOSPITAL_COMMUNITY): Payer: Self-pay | Admitting: Internal Medicine

## 2023-07-22 ENCOUNTER — Inpatient Hospital Stay: Attending: Hematology and Oncology | Admitting: Hematology and Oncology

## 2023-07-22 ENCOUNTER — Encounter: Payer: Self-pay | Admitting: Radiation Oncology

## 2023-07-22 VITALS — BP 132/62 | HR 84 | Temp 98.4°F | Resp 18 | Ht 67.0 in | Wt 140.8 lb

## 2023-07-22 VITALS — BP 132/62 | HR 84 | Temp 98.4°F | Resp 18 | Wt 140.8 lb

## 2023-07-22 DIAGNOSIS — Z8042 Family history of malignant neoplasm of prostate: Secondary | ICD-10-CM | POA: Insufficient documentation

## 2023-07-22 DIAGNOSIS — I10 Essential (primary) hypertension: Secondary | ICD-10-CM | POA: Insufficient documentation

## 2023-07-22 DIAGNOSIS — R0600 Dyspnea, unspecified: Secondary | ICD-10-CM | POA: Insufficient documentation

## 2023-07-22 DIAGNOSIS — Z79899 Other long term (current) drug therapy: Secondary | ICD-10-CM | POA: Insufficient documentation

## 2023-07-22 DIAGNOSIS — Z803 Family history of malignant neoplasm of breast: Secondary | ICD-10-CM | POA: Insufficient documentation

## 2023-07-22 DIAGNOSIS — E039 Hypothyroidism, unspecified: Secondary | ICD-10-CM | POA: Insufficient documentation

## 2023-07-22 DIAGNOSIS — C50512 Malignant neoplasm of lower-outer quadrant of left female breast: Secondary | ICD-10-CM | POA: Insufficient documentation

## 2023-07-22 DIAGNOSIS — C3490 Malignant neoplasm of unspecified part of unspecified bronchus or lung: Secondary | ICD-10-CM | POA: Diagnosis not present

## 2023-07-22 DIAGNOSIS — F17220 Nicotine dependence, chewing tobacco, uncomplicated: Secondary | ICD-10-CM | POA: Diagnosis not present

## 2023-07-22 DIAGNOSIS — Z9012 Acquired absence of left breast and nipple: Secondary | ICD-10-CM | POA: Diagnosis not present

## 2023-07-22 DIAGNOSIS — Z7984 Long term (current) use of oral hypoglycemic drugs: Secondary | ICD-10-CM | POA: Insufficient documentation

## 2023-07-22 DIAGNOSIS — C3432 Malignant neoplasm of lower lobe, left bronchus or lung: Secondary | ICD-10-CM

## 2023-07-22 DIAGNOSIS — E119 Type 2 diabetes mellitus without complications: Secondary | ICD-10-CM | POA: Insufficient documentation

## 2023-07-22 DIAGNOSIS — K219 Gastro-esophageal reflux disease without esophagitis: Secondary | ICD-10-CM | POA: Diagnosis not present

## 2023-07-22 DIAGNOSIS — K5730 Diverticulosis of large intestine without perforation or abscess without bleeding: Secondary | ICD-10-CM | POA: Insufficient documentation

## 2023-07-22 DIAGNOSIS — Z79811 Long term (current) use of aromatase inhibitors: Secondary | ICD-10-CM | POA: Diagnosis not present

## 2023-07-22 DIAGNOSIS — C3492 Malignant neoplasm of unspecified part of left bronchus or lung: Secondary | ICD-10-CM | POA: Diagnosis not present

## 2023-07-22 DIAGNOSIS — Z170 Estrogen receptor positive status [ER+]: Secondary | ICD-10-CM | POA: Insufficient documentation

## 2023-07-22 DIAGNOSIS — Z1732 Human epidermal growth factor receptor 2 negative status: Secondary | ICD-10-CM | POA: Diagnosis not present

## 2023-07-22 DIAGNOSIS — Z853 Personal history of malignant neoplasm of breast: Secondary | ICD-10-CM | POA: Insufficient documentation

## 2023-07-22 DIAGNOSIS — Z1722 Progesterone receptor negative status: Secondary | ICD-10-CM | POA: Diagnosis not present

## 2023-07-22 NOTE — Progress Notes (Addendum)
 [facility] CONSULT NOTE  Patient Care Team: [name], MD as PCP - General (Family Medicine) Ladona Heinz, MD as PCP - Cardiology (Cardiology) Cindie Ole DASEN, MD as PCP - Electrophysiology (Cardiology) Ishmael Slough, MD as Consulting Physician (Rheumatology) Glean Stephane BROCKS, RN (Inactive) as Oncology Nurse Navigator Tyree Nanetta SAILOR, RN as Oncology Nurse Navigator Loretha Ash, MD as Consulting Physician (Hematology and Oncology)  CHIEF COMPLAINTS/PURPOSE OF CONSULTATION:  Newly diagnosed breast cancer  HISTORY OF PRESENTING ILLNESS:  [name] 83 y.o. female is here because of recent diagnosis of left breast IDC  I reviewed her records extensively and collaborated the history with the patient.  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 1.1 cm probable cystic appearing mass lies slightly above and lateral to the lumpectomy bed calcification. Aspiration to confirm this as a benign cyst is recommended.   01/08/2023 Breast US    Ultrasound-guided cyst aspiration of the 1.1 cm presumed complicated cyst in the left breast at 10 o'clock, 2 cm the nipple. If this does not aspirate, ultrasound-guided core needle biopsy would be indicated.     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 VUS identified in BRCA2 p.E1605K (c.4813G>A) and ALK p.T656I (c.1967C>T). The CancerNext-Expanded gene panel offered by [facility] and includes sequencing, rearrangement, and RNA analysis for the following 76 genes: AIP, ALK, APC, ATM, AXIN2, BAP1, BARD1, BMPR1A, BRCA1, BRCA2, BRIP1, CDC73, CDH1, CDK4, CDKN1B, CDKN2A, CEBPA, CHEK2, CTNNA1, DDX41, DICER1, ETV6, FH, FLCN, GATA2, LZTR1, MAX, MBD4, MEN1, MET, MLH1, MSH2, MSH3, MSH6, MUTYH, NF1, NF2, NTHL1, PALB2, PHOX2B, PMS2, POT1, PRKAR1A, PTCH1, PTEN, RAD51C, RAD51D, RB1, RET, RUNX1, SDHA, SDHAF2, SDHB, SDHC, SDHD, SMAD4, SMARCA4, SMARCB1, SMARCE1, STK11, SUFU, TMEM127, TP53, TSC1, TSC2, VHL, and WT1 (sequencing and deletion/duplication); EGFR, HOXB13, KIT, MITF, PDGFRA, POLD1, and POLE (sequencing only); EPCAM and GREM1 (deletion/duplication only). Report date 03/08/23.      History of Present Illness    Discussed the use of AI scribe software for clinical note transcription with the patient, who gave verbal consent to proceed.  History of Present Illness [name] is an 83 year old female with lung cancer who presents for evaluation of her treatment options. She is accompanied by her daughter, Jacinda, who is also her power of attorney, and [name], another daughter.  She has been experiencing a persistent cough for a long time, which has not improved. In May, she was hospitalized, and imaging studies revealed a mass in the left lung and some lymph nodes in the mediastinum. A scope was performed to take samples from the mass in the lung. Subsequent PET scan and MRI of the brain showed no evidence of tumor outside  the lungs and lymph nodes, with no metastatic disease.  She has a history of breast cancer, for which she underwent a mastectomy recently.  She did not receive chemotherapy for breast  In terms of her social history, she is active in her daily life, performing tasks such as shopping, cooking, and attending church and Bible study. She does not garden but is able to climb stairs. She is a never smoker.   MEDICAL HISTORY:  Past Medical History:  Diagnosis Date   Acute bronchitis 12/17/2021   Acute cystitis 02/20/2019   Arthritis    Atrial flutter (HCC)    Breast cancer (HCC)    left  2009 and 2025   Diabetes mellitus without complication (HCC)    Dyspnea    mostly with exertion   GERD (gastroesophageal reflux disease)    Hypertension    Hypothyroidism    Libman Sacks endocarditis (HCC)    Lupus    Pneumonia 07/31/2021    SURGICAL HISTORY: Past Surgical History:  Procedure Laterality Date   BREAST BIOPSY Left 01/15/2023   US  LT BREAST BX W LOC DEV 1ST LESION IMG BX SPEC US  GUIDE 01/15/2023 [facility] MAMMOGRAPHY   BREAST LUMPECTOMY Left 2009   BRONCHIAL BIOPSY  06/29/2023   Procedure: BRONCHOSCOPY, WITH BIOPSY;  Surgeon: [name], MD;  Location: [facility];  Service: Pulmonary;;   BRONCHIAL BRUSHINGS  06/29/2023   Procedure: BRONCHOSCOPY, WITH BRUSH BIOPSY;  Surgeon: [name], MD;  Location: [facility];  Service: Pulmonary;;   BRONCHIAL NEEDLE ASPIRATION BIOPSY  06/29/2023   Procedure: BRONCHOSCOPY, WITH NEEDLE ASPIRATION BIOPSY;  Surgeon: [name], MD;  Location: [facility];  Service: Pulmonary;;   IR 3D INDEPENDENT WKST  05/29/2021   IR ANGIO INTRA EXTRACRAN SEL COM CAROTID INNOMINATE BILAT MOD SED  05/29/2021   IR ANGIO VERTEBRAL SEL VERTEBRAL BILAT MOD SED  05/29/2021   SIMPLE MASTECTOMY WITH AXILLARY SENTINEL NODE BIOPSY Left 03/31/2023   Procedure: LEFT MASTECTOMY;  Surgeon: Vernetta Berg, MD;  Location: [facility];  Service: General;  Laterality: Left;  LMA PEC BLOCK   VIDEO BRONCHOSCOPY WITH ENDOBRONCHIAL NAVIGATION Left 06/29/2023   Procedure: VIDEO BRONCHOSCOPY WITH ENDOBRONCHIAL NAVIGATION;   Surgeon: [name], MD;  Location: [facility];  Service: Pulmonary;  Laterality: Left;   VIDEO BRONCHOSCOPY WITH ENDOBRONCHIAL ULTRASOUND Bilateral 06/29/2023   Procedure: BRONCHOSCOPY, WITH EBUS;  Surgeon: [name], MD;  Location: [facility] ENDOSCOPY;  Service: Pulmonary;  Laterality: Bilateral;    SOCIAL HISTORY: Social History   Socioeconomic History   Marital status: Single    Spouse name: Not on file   Number of children: 2   Years of education: Not on file   Highest education level: Not on file  Occupational History   Not on file  Tobacco Use   Smoking status: Never    Passive exposure: Never   Smokeless tobacco: Current    Types: Chew  Vaping Use   Vaping status: Never Used  Substance and Sexual Activity   Alcohol  use: No   Drug use: No   Sexual activity: Not Currently  Other Topics Concern   Not on file  Social History Narrative   Not on file   Social Drivers of Health   Financial Resource Strain: Not on file  Food Insecurity: No Food Insecurity (06/19/2023)   Hunger Vital Sign    Worried About Running Out of Food in the Last Year: Never true    Ran Out of Food in the Last Year:  Never true  Transportation Needs: No Transportation Needs (06/19/2023)   PRAPARE - Administrator, Civil Service (Medical): No    Lack of Transportation (Non-Medical): No  Physical Activity: Not on file  Stress: Not on file  Social Connections: Unknown (06/19/2023)   Social Connection and Isolation Panel    Frequency of Communication with Friends and Family: More than three times a week    Frequency of Social Gatherings with Friends and Family: More than three times a week    Attends Religious Services: More than 4 times per year    Active Member of [employer] or Organizations: No    Attends Banker Meetings: 1 to 4 times per year    Marital Status: Patient declined  Intimate Partner Violence: Not At Risk (06/19/2023)   Humiliation, Afraid, Rape, and Kick  questionnaire    Fear of Current or Ex-Partner: No    Emotionally Abused: No    Physically Abused: No    Sexually Abused: No    FAMILY HISTORY: Family History  Problem Relation Age of Onset   Prostate cancer Father 87 - 15       metaststic   Asthma Brother    Breast cancer Daughter 84    ALLERGIES:  is allergic to ciprofloxacin, crestor  [rosuvastatin ], lisinopril , and ofev  [nintedanib].  MEDICATIONS:  Current Outpatient Medications  Medication Sig Dispense Refill   albuterol  (ACCUNEB ) 0.63 MG/3ML nebulizer solution Take 3 mLs (0.63 mg total) by nebulization every 6 (six) hours as needed for wheezing. 75 mL 12   albuterol  (VENTOLIN  HFA) 108 (90 Base) MCG/ACT inhaler Inhale 2 puffs into the lungs every 6 (six) hours as needed for wheezing or shortness of breath. 8.5 g 2   anastrozole  (ARIMIDEX ) 1 MG tablet Take 1 tablet (1 mg total) by mouth daily. 90 tablet 3   apixaban  (ELIQUIS ) 5 MG TABS tablet Take 1 tablet (5 mg total) by mouth 2 (two) times daily. Okay to restart on 06/30/2023     diclofenac  Sodium (VOLTAREN ) 1 % GEL Apply 1 application. topically 4 (four) times daily as needed (pain).     diltiazem  (CARDIZEM  CD) 120 MG 24 hr capsule Take 1 capsule (120 mg total) by mouth daily. 90 capsule 3   gabapentin  (NEURONTIN ) 300 MG capsule Take 300 mg by mouth 3 (three) times daily.     glimepiride  (AMARYL ) 1 MG tablet Take 1 mg by mouth daily with breakfast.     hydroxychloroquine  (PLAQUENIL ) 200 MG tablet Take 200 mg by mouth daily.     levothyroxine  (SYNTHROID ) 100 MCG tablet Take 100 mcg by mouth daily.     omeprazole (PRILOSEC) 20 MG capsule Take 20 mg by mouth daily as needed (Indigestion).     predniSONE  (DELTASONE ) 20 MG tablet Take 1 tablet (20 mg total) by mouth daily with breakfast. 4 tablet 0   promethazine -dextromethorphan  (PROMETHAZINE -DM) 6.25-15 MG/5ML syrup Take 5 mLs by mouth 4 (four) times daily as needed for cough. (Patient not taking: Reported on 07/09/2023) 118 mL 0    spironolactone  (ALDACTONE ) 25 MG tablet Take 0.5 tablets (12.5 mg total) by mouth daily. 45 tablet 3   No current facility-administered medications for this visit.    REVIEW OF SYSTEMS:   Constitutional: Denies fevers, chills or abnormal night sweats Eyes: Denies blurriness of vision, double vision or watery eyes Ears, nose, mouth, throat, and face: Denies mucositis or sore throat Respiratory: Denies cough, dyspnea or wheezes Cardiovascular: Denies palpitation, chest discomfort or lower extremity swelling Gastrointestinal:  Denies nausea, heartburn or change in bowel habits Skin: Denies abnormal skin rashes Lymphatics: Denies new lymphadenopathy or easy bruising Neurological:Denies numbness, tingling or new weaknesses Behavioral/Psych: Mood is stable, no new changes  Breast: Denies any palpable lumps or discharge All other systems were reviewed with the patient and are negative.  PHYSICAL EXAMINATION: ECOG PERFORMANCE STATUS: 1 - Symptomatic but completely ambulatory  Vitals:   07/22/23 1112  BP: 132/62  Pulse: 84  Resp: 18  Temp: 98.4 F (36.9 C)  SpO2: 97%    Filed Weights   07/22/23 1112  Weight: 140 lb 12.8 oz (63.9 kg)   GENERAL:alert, no distress and comfortable  LABORATORY DATA:  I have reviewed the data as listed Lab Results  Component Value Date   WBC 12.9 (H) 06/20/2023   HGB 11.0 (L) 06/20/2023   HCT 34.1 (L) 06/20/2023   MCV 88.8 06/20/2023   PLT 276 06/20/2023   Lab Results  Component Value Date   NA 134 (L) 06/29/2023   K 3.9 06/29/2023   CL 99 06/29/2023   CO2 24 06/29/2023    RADIOGRAPHIC STUDIES: I have personally reviewed the radiological reports and agreed with the findings in the report.  ASSESSMENT AND PLAN:  No problem-specific Assessment & Plan notes found for this encounter.  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 Not a candidate for chemoradiation or immunotherapy. Awaiting targeted therapy test results. Discussed sequential chemotherapy and radiation, acknowledging radiation alone may not be curative. - Await targeted therapy test results. - Consult radiation oncologist for radiation therapy. - Consider sequential chemotherapy followed by radiation if no targeted therapy, adjust dose for age and health. - Discuss quality of life and treatment goals with her and family.  Breast Cancer Recurrence New left breast mass identified incidentally on CT scan.  History of breast cancer in 2009 treated with lumpectomy and radiation.  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 She is not a candidate for irradiation. Anastrozole  recommended due to age and frailty precluding chemotherapy; -Baseline bone density normal.  Memory problems Memory issues likely age-related, affecting daily activities minimally.  Time spent: 45 min, reviewing records, face to face with pt, counseling, coordination of care. All questions were answered. The patient knows to call the clinic with any problems, questions or concerns.    Amber Stalls, MD 07/22/23

## 2023-07-23 DIAGNOSIS — E1169 Type 2 diabetes mellitus with other specified complication: Secondary | ICD-10-CM | POA: Diagnosis not present

## 2023-07-23 DIAGNOSIS — I2699 Other pulmonary embolism without acute cor pulmonale: Secondary | ICD-10-CM | POA: Diagnosis not present

## 2023-07-23 DIAGNOSIS — C3490 Malignant neoplasm of unspecified part of unspecified bronchus or lung: Secondary | ICD-10-CM | POA: Diagnosis not present

## 2023-07-23 DIAGNOSIS — I4892 Unspecified atrial flutter: Secondary | ICD-10-CM | POA: Diagnosis not present

## 2023-07-23 DIAGNOSIS — Z853 Personal history of malignant neoplasm of breast: Secondary | ICD-10-CM | POA: Diagnosis not present

## 2023-07-23 DIAGNOSIS — I10 Essential (primary) hypertension: Secondary | ICD-10-CM | POA: Diagnosis not present

## 2023-07-23 DIAGNOSIS — E039 Hypothyroidism, unspecified: Secondary | ICD-10-CM | POA: Diagnosis not present

## 2023-07-23 DIAGNOSIS — K219 Gastro-esophageal reflux disease without esophagitis: Secondary | ICD-10-CM | POA: Diagnosis not present

## 2023-07-23 DIAGNOSIS — M3213 Lung involvement in systemic lupus erythematosus: Secondary | ICD-10-CM | POA: Diagnosis not present

## 2023-07-24 ENCOUNTER — Encounter (HOSPITAL_COMMUNITY): Payer: Self-pay | Admitting: Interventional Radiology

## 2023-07-26 DIAGNOSIS — Z79899 Other long term (current) drug therapy: Secondary | ICD-10-CM | POA: Diagnosis not present

## 2023-07-26 DIAGNOSIS — M329 Systemic lupus erythematosus, unspecified: Secondary | ICD-10-CM | POA: Diagnosis not present

## 2023-07-26 DIAGNOSIS — J432 Centrilobular emphysema: Secondary | ICD-10-CM | POA: Diagnosis not present

## 2023-07-26 DIAGNOSIS — G629 Polyneuropathy, unspecified: Secondary | ICD-10-CM | POA: Diagnosis not present

## 2023-07-26 DIAGNOSIS — E1169 Type 2 diabetes mellitus with other specified complication: Secondary | ICD-10-CM | POA: Diagnosis not present

## 2023-07-26 DIAGNOSIS — I10 Essential (primary) hypertension: Secondary | ICD-10-CM | POA: Diagnosis not present

## 2023-07-26 DIAGNOSIS — E1141 Type 2 diabetes mellitus with diabetic mononeuropathy: Secondary | ICD-10-CM | POA: Diagnosis not present

## 2023-07-26 DIAGNOSIS — J849 Interstitial pulmonary disease, unspecified: Secondary | ICD-10-CM | POA: Diagnosis not present

## 2023-07-27 ENCOUNTER — Inpatient Hospital Stay: Attending: Hematology and Oncology | Admitting: Hematology and Oncology

## 2023-07-27 VITALS — BP 125/60 | HR 75 | Temp 98.1°F | Resp 17 | Wt 141.9 lb

## 2023-07-27 DIAGNOSIS — Z1732 Human epidermal growth factor receptor 2 negative status: Secondary | ICD-10-CM | POA: Diagnosis not present

## 2023-07-27 DIAGNOSIS — C3432 Malignant neoplasm of lower lobe, left bronchus or lung: Secondary | ICD-10-CM | POA: Insufficient documentation

## 2023-07-27 DIAGNOSIS — R413 Other amnesia: Secondary | ICD-10-CM | POA: Insufficient documentation

## 2023-07-27 DIAGNOSIS — Z1722 Progesterone receptor negative status: Secondary | ICD-10-CM | POA: Diagnosis not present

## 2023-07-27 DIAGNOSIS — C3490 Malignant neoplasm of unspecified part of unspecified bronchus or lung: Secondary | ICD-10-CM | POA: Diagnosis present

## 2023-07-27 DIAGNOSIS — C50512 Malignant neoplasm of lower-outer quadrant of left female breast: Secondary | ICD-10-CM | POA: Insufficient documentation

## 2023-07-27 DIAGNOSIS — Z170 Estrogen receptor positive status [ER+]: Secondary | ICD-10-CM | POA: Diagnosis not present

## 2023-07-27 DIAGNOSIS — Z79811 Long term (current) use of aromatase inhibitors: Secondary | ICD-10-CM | POA: Insufficient documentation

## 2023-07-27 DIAGNOSIS — Z9012 Acquired absence of left breast and nipple: Secondary | ICD-10-CM | POA: Insufficient documentation

## 2023-07-27 DIAGNOSIS — F17220 Nicotine dependence, chewing tobacco, uncomplicated: Secondary | ICD-10-CM | POA: Diagnosis not present

## 2023-07-27 DIAGNOSIS — Z510 Encounter for antineoplastic radiation therapy: Secondary | ICD-10-CM | POA: Insufficient documentation

## 2023-07-27 NOTE — Progress Notes (Signed)
 [facility] CONSULT NOTE  Patient Care Team: [name], MD as PCP - General (Family Medicine) Ladona Heinz, MD as PCP - Cardiology (Cardiology) Cindie Ole DASEN, MD as PCP - Electrophysiology (Cardiology) Ishmael Slough, MD as Consulting Physician (Rheumatology) Glean Stephane BROCKS, RN (Inactive) as Oncology Nurse Navigator Tyree Nanetta SAILOR, RN as Oncology Nurse Navigator Loretha Ash, MD as Consulting Physician (Hematology and Oncology)  CHIEF COMPLAINTS/PURPOSE OF CONSULTATION:  Newly diagnosed breast cancer  HISTORY OF PRESENTING ILLNESS:  [name] 83 y.o. female is here because of recent diagnosis of left breast IDC  I reviewed her records extensively and collaborated the history with the patient.  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 1.1 cm probable cystic appearing mass lies slightly above and lateral to the lumpectomy bed calcification. Aspiration to confirm this as a benign cyst is recommended.   01/08/2023 Breast US    Ultrasound-guided cyst aspiration of the 1.1 cm presumed complicated cyst in the left breast at 10 o'clock, 2 cm the nipple. If this does not aspirate, ultrasound-guided core needle biopsy would be indicated.     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 VUS identified in BRCA2 p.E1605K (c.4813G>A) and ALK p.T656I (c.1967C>T). The CancerNext-Expanded gene panel offered by [facility] and includes sequencing, rearrangement, and RNA analysis for the following 76 genes: AIP, ALK, APC, ATM, AXIN2, BAP1, BARD1, BMPR1A, BRCA1, BRCA2, BRIP1, CDC73, CDH1, CDK4, CDKN1B, CDKN2A, CEBPA, CHEK2, CTNNA1, DDX41, DICER1, ETV6, FH, FLCN, GATA2, LZTR1, MAX, MBD4, MEN1, MET, MLH1, MSH2, MSH3, MSH6, MUTYH, NF1, NF2, NTHL1, PALB2, PHOX2B, PMS2, POT1, PRKAR1A, PTCH1, PTEN, RAD51C, RAD51D, RB1, RET, RUNX1, SDHA, SDHAF2, SDHB, SDHC, SDHD, SMAD4, SMARCA4, SMARCB1, SMARCE1, STK11, SUFU, TMEM127, TP53, TSC1, TSC2, VHL, and WT1 (sequencing and deletion/duplication); EGFR, HOXB13, KIT, MITF, PDGFRA, POLD1, and POLE (sequencing only); EPCAM and GREM1 (deletion/duplication only). Report date 03/08/23.    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)   07/22/2023 Cancer Staging   Staging form: Lung, AJCC V9 - Clinical: Stage IIIB (cT2, cN2b(f), cM0) - Signed by Loretha Ash, MD on 07/22/2023 Method of lymph node assessment: Fine needle aspiration     History of Present Illness    Discussed the use of AI scribe software for clinical note transcription with the patient, who gave verbal consent to proceed.  History of Present Illness  [name] is an 83 year old female with non-small cell lung cancer who presents for discussion of treatment options.  She has non-small cell lung cancer without any targetable gene alterations, as recent genetic testing returned negative results for EGFR, ROS, ALK, and  RET. She is currently taking medication for breast cancer, which she plans to continue during any  potential radiation treatment. She is reluctant to pursue chemotherapy due to concerns about side effects such as hair loss, fatigue, nausea, and increased risk of infections.  In terms of her daily activities, she remains quite active, engaging in activities such as cooking, cleaning, and attending church. She also climbs stairs daily to reach her second-floor apartment. However, she has been feeling unusually tired and has had a decreased appetite recently, which may be attributed to stress.   MEDICAL HISTORY:  Past Medical History:  Diagnosis Date   Acute bronchitis 12/17/2021   Acute cystitis 02/20/2019   Arthritis    Atrial flutter (HCC)    Breast cancer (HCC)    left  2009 and 2025   Diabetes mellitus without complication (HCC)    Dyspnea    mostly with exertion   GERD (gastroesophageal reflux disease)    Hypertension    Hypothyroidism    Libman Sacks endocarditis (HCC)    Lupus    Pneumonia 07/31/2021    SURGICAL HISTORY: Past Surgical History:  Procedure Laterality Date   BREAST BIOPSY Left 01/15/2023   US  LT BREAST BX W LOC DEV 1ST LESION IMG BX SPEC US  GUIDE 01/15/2023 [facility]AMMOGR[facility]   BREAST LUMPECTOMY Left 2009   BRONCHIAL BIOPSY  06/29/2023   Procedure: BRONCHOSCOPY, WITH BIOPSY;  Surgeon: [name], MD;  Location: [facility];  Service: Pulmonary;;   BRONCHIAL BRUSHINGS  06/29/2023   Procedure: BRONCHOSCOPY, WITH BRUSH BIOPSY;  Surgeon: [name], MD;  Location: [facility];  Service: Pulmonary;;   BRONCHIAL NEEDLE ASPIRATION BIOPSY  06/29/2023   Procedure: BRONCHOSCOPY, WITH NEEDLE ASPIRATION BIOPSY;  Surgeon: [name], MD;  Location: [facility];  Service: Pulmonary;;   IR 3D INDEPENDENT WKST  05/29/2021   IR ANGIO INTRA EXTRACRAN SEL COM CAROTID INNOMINATE BILAT MOD SED  05/29/2021   IR ANGIO VERTEBRAL SEL VERTEBRAL BILAT MOD SED  05/29/2021   SIMPLE MASTECTOMY WITH AXILLARY SENTINEL NODE BIOPSY Left 03/31/2023   Procedure: LEFT MASTECTOMY;   Surgeon: Vernetta Berg, MD;  Location: [facility] OR;  Service: General;  Laterality: Left;  LMA PEC BLOCK   VIDEO BRONCHOSCOPY WITH ENDOBRONCHIAL NAVIGATION Left 06/29/2023   Procedure: VIDEO BRONCHOSCOPY WITH ENDOBRONCHIAL NAVIGATION;  Surgeon: [name], MD;  Location: [facility];  Service: Pulmonary;  Laterality: Left;   VIDEO BRONCHOSCOPY WITH ENDOBRONCHIAL ULTRASOUND Bilateral 06/29/2023   Procedure: BRONCHOSCOPY, WITH EBUS;  Surgeon: [name], MD;  Location: [facility] ENDOSCOPY;  Service: Pulmonary;  Laterality: Bilateral;    SOCIAL HISTORY: Social History   Socioeconomic History   Marital status: Single    Spouse name: Not on file   Number of children: 2   Years of education: Not on file   Highest education level: Not on file  Occupational History   Not on file  Tobacco Use   Smoking status: Never    Passive exposure: Never   Smokeless tobacco: Current    Types: Chew  Vaping Use   Vaping status: Never Used  Substance and Sexual Activity   Alcohol  use: No   Drug use: No   Sexual activity: Not Currently  Other Topics Concern   Not on file  Social History Narrative   Not on file   Social Drivers of Health   Financial Resource Strain: Not on file  Food Insecurity: Food Insecurity Present (07/22/2023)   Hunger Vital Sign    Worried About  Running Out of Food in the Last Year: Sometimes true    Ran Out of Food in the Last Year: Sometimes true  Transportation Needs: No Transportation Needs (07/22/2023)   PRAPARE - Administrator, Civil Service (Medical): No    Lack of Transportation (Non-Medical): No  Physical Activity: Not on file  Stress: Not on file  Social Connections: Unknown (06/19/2023)   Social Connection and Isolation Panel    Frequency of Communication with Friends and Family: More than three times a week    Frequency of Social Gatherings with Friends and Family: More than three times a week    Attends Religious Services: More than 4 times per  year    Active Member of [employer] or Organizations: No    Attends Banker Meetings: 1 to 4 times per year    Marital Status: Patient declined  Intimate Partner Violence: Not At Risk (07/22/2023)   Humiliation, Afraid, Rape, and Kick questionnaire    Fear of Current or Ex-Partner: No    Emotionally Abused: No    Physically Abused: No    Sexually Abused: No    FAMILY HISTORY: Family History  Problem Relation Age of Onset   Cancer Father        prostate   Prostate cancer Father 25 - 73       metaststic   Asthma Brother    Cancer Daughter        breast   Breast cancer Daughter 46    ALLERGIES:  is allergic to ciprofloxacin, crestor  [rosuvastatin ], lisinopril , and ofev  [nintedanib].  MEDICATIONS:  Current Outpatient Medications  Medication Sig Dispense Refill   albuterol  (ACCUNEB ) 0.63 MG/3ML nebulizer solution Take 3 mLs (0.63 mg total) by nebulization every 6 (six) hours as needed for wheezing. 75 mL 12   albuterol  (VENTOLIN  HFA) 108 (90 Base) MCG/ACT inhaler Inhale 2 puffs into the lungs every 6 (six) hours as needed for wheezing or shortness of breath. 8.5 g 2   anastrozole  (ARIMIDEX ) 1 MG tablet Take 1 tablet (1 mg total) by mouth daily. 90 tablet 3   apixaban  (ELIQUIS ) 5 MG TABS tablet Take 1 tablet (5 mg total) by mouth 2 (two) times daily. Okay to restart on 06/30/2023     diclofenac  Sodium (VOLTAREN ) 1 % GEL Apply 1 application. topically 4 (four) times daily as needed (pain).     diltiazem  (CARDIZEM  CD) 120 MG 24 hr capsule Take 1 capsule (120 mg total) by mouth daily. 90 capsule 3   gabapentin  (NEURONTIN ) 300 MG capsule Take 300 mg by mouth 3 (three) times daily.     glimepiride  (AMARYL ) 1 MG tablet Take 1 mg by mouth daily with breakfast.     hydroxychloroquine  (PLAQUENIL ) 200 MG tablet Take 200 mg by mouth daily.     levothyroxine  (SYNTHROID ) 100 MCG tablet Take 100 mcg by mouth daily.     omeprazole (PRILOSEC) 20 MG capsule Take 20 mg by mouth daily as needed  (Indigestion).     predniSONE  (DELTASONE ) 20 MG tablet Take 1 tablet (20 mg total) by mouth daily with breakfast. 4 tablet 0   promethazine -dextromethorphan  (PROMETHAZINE -DM) 6.25-15 MG/5ML syrup Take 5 mLs by mouth 4 (four) times daily as needed for cough. (Patient not taking: Reported on 07/09/2023) 118 mL 0   spironolactone  (ALDACTONE ) 25 MG tablet Take 0.5 tablets (12.5 mg total) by mouth daily. 45 tablet 3   No current facility-administered medications for this visit.     PHYSICAL EXAMINATION: ECOG PERFORMANCE  STATUS: 1 - Symptomatic but completely ambulatory  Vitals:   07/27/23 1558  BP: 125/60  Pulse: 75  Resp: 17  Temp: 98.1 F (36.7 C)  SpO2: 94%    Filed Weights   07/27/23 1558  Weight: 141 lb 14.4 oz (64.4 kg)   GENERAL:alert, no distress and comfortable  LABORATORY DATA:  I have reviewed the data as listed Lab Results  Component Value Date   WBC 12.9 (H) 06/20/2023   HGB 11.0 (L) 06/20/2023   HCT 34.1 (L) 06/20/2023   MCV 88.8 06/20/2023   PLT 276 06/20/2023   Lab Results  Component Value Date   NA 134 (L) 06/29/2023   K 3.9 06/29/2023   CL 99 06/29/2023   CO2 24 06/29/2023    RADIOGRAPHIC STUDIES: I have personally reviewed the radiological reports and agreed with the findings in the report.  ASSESSMENT AND PLAN:  No problem-specific Assessment & Plan notes found for this encounter.  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 Not a candidate for concurrent chemoradiation or immunotherapy alone based on TMB status. Awaiting targeted therapy test results. Discussed sequential chemotherapy and radiation, acknowledging radiation alone may not be curative. - No actionable mutations, PDL1 TPS score is 0. - We have discussed sequential  chemoradiation, I doubt she will tolerate concurrent chemoradiation. Spoke to Dr Shannon, he said he has done def radiation alone as well without chemo. She wants to omit chemo and proceed with rad only. She understands this is an inferior approach but she wants to have good QOL. In that case, she will RTC in 3 months. Sent an in basket message to Dr Shannon.  Breast Cancer Recurrence New left breast mass identified incidentally on CT scan.  History of breast cancer in 2009 treated with lumpectomy and radiation.  She is now s/p mastectomy, 1.6 cm grade 3 IDC, neg marging, ER 70% weak to moderate staining, PR neg, Her 2 neg, high ki 67. -Baseline bone density normal. - ok to continue anastrozole  daily.  Memory problems Memory issues likely age-related, affecting daily activities minimally.  Time spent: 20 min reviewing records, face to face with pt, counseling, coordination of care. All questions were answered. The patient knows to call the clinic with any problems, questions or concerns.    Amber Stalls, MD 07/27/23

## 2023-08-02 ENCOUNTER — Ambulatory Visit
Admission: RE | Admit: 2023-08-02 | Discharge: 2023-08-02 | Disposition: A | Discharge status: Home / Self Care | Source: Ambulatory Visit | Attending: Radiation Oncology | Admitting: Radiation Oncology

## 2023-08-02 DIAGNOSIS — R413 Other amnesia: Secondary | ICD-10-CM | POA: Diagnosis not present

## 2023-08-02 DIAGNOSIS — Z510 Encounter for antineoplastic radiation therapy: Secondary | ICD-10-CM | POA: Insufficient documentation

## 2023-08-02 DIAGNOSIS — C50512 Malignant neoplasm of lower-outer quadrant of left female breast: Secondary | ICD-10-CM | POA: Diagnosis not present

## 2023-08-02 DIAGNOSIS — C3432 Malignant neoplasm of lower lobe, left bronchus or lung: Secondary | ICD-10-CM | POA: Insufficient documentation

## 2023-08-02 DIAGNOSIS — C3490 Malignant neoplasm of unspecified part of unspecified bronchus or lung: Secondary | ICD-10-CM

## 2023-08-02 DIAGNOSIS — Z1722 Progesterone receptor negative status: Secondary | ICD-10-CM | POA: Diagnosis not present

## 2023-08-02 DIAGNOSIS — Z79811 Long term (current) use of aromatase inhibitors: Secondary | ICD-10-CM | POA: Diagnosis not present

## 2023-08-02 DIAGNOSIS — Z170 Estrogen receptor positive status [ER+]: Secondary | ICD-10-CM | POA: Diagnosis not present

## 2023-08-02 DIAGNOSIS — Z9012 Acquired absence of left breast and nipple: Secondary | ICD-10-CM | POA: Diagnosis not present

## 2023-08-02 DIAGNOSIS — F17220 Nicotine dependence, chewing tobacco, uncomplicated: Secondary | ICD-10-CM | POA: Diagnosis not present

## 2023-08-02 DIAGNOSIS — Z1732 Human epidermal growth factor receptor 2 negative status: Secondary | ICD-10-CM | POA: Diagnosis not present

## 2023-08-04 LAB — CYTOLOGY - NON PAP

## 2023-08-05 DIAGNOSIS — Z1732 Human epidermal growth factor receptor 2 negative status: Secondary | ICD-10-CM | POA: Diagnosis not present

## 2023-08-05 DIAGNOSIS — Z170 Estrogen receptor positive status [ER+]: Secondary | ICD-10-CM | POA: Diagnosis not present

## 2023-08-05 DIAGNOSIS — F17220 Nicotine dependence, chewing tobacco, uncomplicated: Secondary | ICD-10-CM | POA: Diagnosis not present

## 2023-08-05 DIAGNOSIS — Z9012 Acquired absence of left breast and nipple: Secondary | ICD-10-CM | POA: Diagnosis not present

## 2023-08-05 DIAGNOSIS — C50512 Malignant neoplasm of lower-outer quadrant of left female breast: Secondary | ICD-10-CM | POA: Diagnosis not present

## 2023-08-05 DIAGNOSIS — R413 Other amnesia: Secondary | ICD-10-CM | POA: Diagnosis not present

## 2023-08-05 DIAGNOSIS — C3432 Malignant neoplasm of lower lobe, left bronchus or lung: Secondary | ICD-10-CM | POA: Diagnosis not present

## 2023-08-05 DIAGNOSIS — Z1722 Progesterone receptor negative status: Secondary | ICD-10-CM | POA: Diagnosis not present

## 2023-08-05 DIAGNOSIS — Z79811 Long term (current) use of aromatase inhibitors: Secondary | ICD-10-CM | POA: Diagnosis not present

## 2023-08-05 DIAGNOSIS — Z510 Encounter for antineoplastic radiation therapy: Secondary | ICD-10-CM | POA: Diagnosis not present

## 2023-08-09 ENCOUNTER — Ambulatory Visit
Admission: RE | Admit: 2023-08-09 | Discharge: 2023-08-09 | Disposition: A | Discharge status: Home / Self Care | Source: Ambulatory Visit | Attending: Radiation Oncology | Admitting: Radiation Oncology

## 2023-08-09 ENCOUNTER — Other Ambulatory Visit: Payer: Self-pay

## 2023-08-09 DIAGNOSIS — Z1732 Human epidermal growth factor receptor 2 negative status: Secondary | ICD-10-CM | POA: Diagnosis not present

## 2023-08-09 DIAGNOSIS — R413 Other amnesia: Secondary | ICD-10-CM | POA: Diagnosis not present

## 2023-08-09 DIAGNOSIS — C3490 Malignant neoplasm of unspecified part of unspecified bronchus or lung: Secondary | ICD-10-CM

## 2023-08-09 DIAGNOSIS — Z79811 Long term (current) use of aromatase inhibitors: Secondary | ICD-10-CM | POA: Diagnosis not present

## 2023-08-09 DIAGNOSIS — Z9012 Acquired absence of left breast and nipple: Secondary | ICD-10-CM | POA: Diagnosis not present

## 2023-08-09 DIAGNOSIS — C50512 Malignant neoplasm of lower-outer quadrant of left female breast: Secondary | ICD-10-CM | POA: Diagnosis not present

## 2023-08-09 DIAGNOSIS — Z510 Encounter for antineoplastic radiation therapy: Secondary | ICD-10-CM | POA: Diagnosis not present

## 2023-08-09 DIAGNOSIS — Z170 Estrogen receptor positive status [ER+]: Secondary | ICD-10-CM | POA: Diagnosis not present

## 2023-08-09 DIAGNOSIS — C3432 Malignant neoplasm of lower lobe, left bronchus or lung: Secondary | ICD-10-CM | POA: Diagnosis not present

## 2023-08-09 DIAGNOSIS — F17220 Nicotine dependence, chewing tobacco, uncomplicated: Secondary | ICD-10-CM | POA: Diagnosis not present

## 2023-08-09 DIAGNOSIS — Z1722 Progesterone receptor negative status: Secondary | ICD-10-CM | POA: Diagnosis not present

## 2023-08-09 LAB — RAD ONC ARIA SESSION SUMMARY
Course Elapsed Days: 0
Plan Fractions Treated to Date: 1
Plan Prescribed Dose Per Fraction: 2 Gy
Plan Total Fractions Prescribed: 30
Plan Total Prescribed Dose: 60 Gy
Reference Point Dosage Given to Date: 2 Gy
Reference Point Session Dosage Given: 2 Gy
Session Number: 1

## 2023-08-10 ENCOUNTER — Ambulatory Visit
Admission: RE | Admit: 2023-08-10 | Discharge: 2023-08-10 | Disposition: A | Discharge status: Home / Self Care | Source: Ambulatory Visit | Attending: Radiation Oncology | Admitting: Radiation Oncology

## 2023-08-10 ENCOUNTER — Other Ambulatory Visit: Payer: Self-pay

## 2023-08-10 DIAGNOSIS — C3490 Malignant neoplasm of unspecified part of unspecified bronchus or lung: Secondary | ICD-10-CM

## 2023-08-10 DIAGNOSIS — Z1732 Human epidermal growth factor receptor 2 negative status: Secondary | ICD-10-CM | POA: Diagnosis not present

## 2023-08-10 DIAGNOSIS — C3432 Malignant neoplasm of lower lobe, left bronchus or lung: Secondary | ICD-10-CM | POA: Diagnosis not present

## 2023-08-10 DIAGNOSIS — Z9012 Acquired absence of left breast and nipple: Secondary | ICD-10-CM | POA: Diagnosis not present

## 2023-08-10 DIAGNOSIS — Z170 Estrogen receptor positive status [ER+]: Secondary | ICD-10-CM | POA: Diagnosis not present

## 2023-08-10 DIAGNOSIS — R413 Other amnesia: Secondary | ICD-10-CM | POA: Diagnosis not present

## 2023-08-10 DIAGNOSIS — F17220 Nicotine dependence, chewing tobacco, uncomplicated: Secondary | ICD-10-CM | POA: Diagnosis not present

## 2023-08-10 DIAGNOSIS — Z79811 Long term (current) use of aromatase inhibitors: Secondary | ICD-10-CM | POA: Diagnosis not present

## 2023-08-10 DIAGNOSIS — C50512 Malignant neoplasm of lower-outer quadrant of left female breast: Secondary | ICD-10-CM | POA: Diagnosis not present

## 2023-08-10 DIAGNOSIS — Z1722 Progesterone receptor negative status: Secondary | ICD-10-CM | POA: Diagnosis not present

## 2023-08-10 DIAGNOSIS — Z510 Encounter for antineoplastic radiation therapy: Secondary | ICD-10-CM | POA: Diagnosis not present

## 2023-08-10 LAB — RAD ONC ARIA SESSION SUMMARY
Course Elapsed Days: 1
Plan Fractions Treated to Date: 2
Plan Prescribed Dose Per Fraction: 2 Gy
Plan Total Fractions Prescribed: 30
Plan Total Prescribed Dose: 60 Gy
Reference Point Dosage Given to Date: 4 Gy
Reference Point Session Dosage Given: 2 Gy
Session Number: 2

## 2023-08-10 MED ORDER — SONAFINE EX EMUL
1.0000 | Freq: Once | CUTANEOUS | Status: AC
  Administered 2023-08-10: 1 via TOPICAL

## 2023-08-11 ENCOUNTER — Other Ambulatory Visit: Payer: Self-pay

## 2023-08-11 ENCOUNTER — Ambulatory Visit
Admission: RE | Admit: 2023-08-11 | Discharge: 2023-08-11 | Disposition: A | Discharge status: Home / Self Care | Source: Ambulatory Visit | Attending: Radiation Oncology | Admitting: Radiation Oncology

## 2023-08-11 DIAGNOSIS — Z1722 Progesterone receptor negative status: Secondary | ICD-10-CM | POA: Diagnosis not present

## 2023-08-11 DIAGNOSIS — Z9012 Acquired absence of left breast and nipple: Secondary | ICD-10-CM | POA: Diagnosis not present

## 2023-08-11 DIAGNOSIS — C3432 Malignant neoplasm of lower lobe, left bronchus or lung: Secondary | ICD-10-CM | POA: Diagnosis not present

## 2023-08-11 DIAGNOSIS — R413 Other amnesia: Secondary | ICD-10-CM | POA: Diagnosis not present

## 2023-08-11 DIAGNOSIS — C50512 Malignant neoplasm of lower-outer quadrant of left female breast: Secondary | ICD-10-CM | POA: Diagnosis not present

## 2023-08-11 DIAGNOSIS — F17220 Nicotine dependence, chewing tobacco, uncomplicated: Secondary | ICD-10-CM | POA: Diagnosis not present

## 2023-08-11 DIAGNOSIS — Z170 Estrogen receptor positive status [ER+]: Secondary | ICD-10-CM | POA: Diagnosis not present

## 2023-08-11 DIAGNOSIS — Z510 Encounter for antineoplastic radiation therapy: Secondary | ICD-10-CM | POA: Diagnosis not present

## 2023-08-11 DIAGNOSIS — Z1732 Human epidermal growth factor receptor 2 negative status: Secondary | ICD-10-CM | POA: Diagnosis not present

## 2023-08-11 DIAGNOSIS — Z79811 Long term (current) use of aromatase inhibitors: Secondary | ICD-10-CM | POA: Diagnosis not present

## 2023-08-11 LAB — RAD ONC ARIA SESSION SUMMARY
Course Elapsed Days: 2
Plan Fractions Treated to Date: 3
Plan Prescribed Dose Per Fraction: 2 Gy
Plan Total Fractions Prescribed: 30
Plan Total Prescribed Dose: 60 Gy
Reference Point Dosage Given to Date: 6 Gy
Reference Point Session Dosage Given: 2 Gy
Session Number: 3

## 2023-08-12 ENCOUNTER — Other Ambulatory Visit: Payer: Self-pay

## 2023-08-12 ENCOUNTER — Ambulatory Visit

## 2023-08-12 ENCOUNTER — Ambulatory Visit
Admission: RE | Admit: 2023-08-12 | Discharge: 2023-08-12 | Disposition: A | Discharge status: Home / Self Care | Source: Ambulatory Visit | Attending: Radiation Oncology | Admitting: Radiation Oncology

## 2023-08-12 DIAGNOSIS — Z510 Encounter for antineoplastic radiation therapy: Secondary | ICD-10-CM | POA: Diagnosis not present

## 2023-08-12 DIAGNOSIS — F17220 Nicotine dependence, chewing tobacco, uncomplicated: Secondary | ICD-10-CM | POA: Diagnosis not present

## 2023-08-12 DIAGNOSIS — Z79811 Long term (current) use of aromatase inhibitors: Secondary | ICD-10-CM | POA: Diagnosis not present

## 2023-08-12 DIAGNOSIS — Z9012 Acquired absence of left breast and nipple: Secondary | ICD-10-CM | POA: Diagnosis not present

## 2023-08-12 DIAGNOSIS — C3432 Malignant neoplasm of lower lobe, left bronchus or lung: Secondary | ICD-10-CM | POA: Diagnosis not present

## 2023-08-12 DIAGNOSIS — Z1732 Human epidermal growth factor receptor 2 negative status: Secondary | ICD-10-CM | POA: Diagnosis not present

## 2023-08-12 DIAGNOSIS — Z1722 Progesterone receptor negative status: Secondary | ICD-10-CM | POA: Diagnosis not present

## 2023-08-12 DIAGNOSIS — C50512 Malignant neoplasm of lower-outer quadrant of left female breast: Secondary | ICD-10-CM | POA: Diagnosis not present

## 2023-08-12 DIAGNOSIS — C3490 Malignant neoplasm of unspecified part of unspecified bronchus or lung: Secondary | ICD-10-CM

## 2023-08-12 DIAGNOSIS — R413 Other amnesia: Secondary | ICD-10-CM | POA: Diagnosis not present

## 2023-08-12 DIAGNOSIS — Z170 Estrogen receptor positive status [ER+]: Secondary | ICD-10-CM | POA: Diagnosis not present

## 2023-08-12 LAB — RAD ONC ARIA SESSION SUMMARY
Course Elapsed Days: 3
Plan Fractions Treated to Date: 4
Plan Prescribed Dose Per Fraction: 2 Gy
Plan Total Fractions Prescribed: 30
Plan Total Prescribed Dose: 60 Gy
Reference Point Dosage Given to Date: 8 Gy
Reference Point Session Dosage Given: 2 Gy
Session Number: 4

## 2023-08-13 ENCOUNTER — Ambulatory Visit
Admission: RE | Admit: 2023-08-13 | Discharge: 2023-08-13 | Disposition: A | Discharge status: Home / Self Care | Source: Ambulatory Visit | Attending: Radiation Oncology | Admitting: Radiation Oncology

## 2023-08-13 ENCOUNTER — Other Ambulatory Visit: Payer: Self-pay

## 2023-08-13 ENCOUNTER — Telehealth: Payer: Self-pay | Admitting: *Deleted

## 2023-08-13 ENCOUNTER — Telehealth: Payer: Self-pay

## 2023-08-13 DIAGNOSIS — C50512 Malignant neoplasm of lower-outer quadrant of left female breast: Secondary | ICD-10-CM | POA: Diagnosis not present

## 2023-08-13 DIAGNOSIS — Z510 Encounter for antineoplastic radiation therapy: Secondary | ICD-10-CM | POA: Diagnosis not present

## 2023-08-13 DIAGNOSIS — F17220 Nicotine dependence, chewing tobacco, uncomplicated: Secondary | ICD-10-CM | POA: Diagnosis not present

## 2023-08-13 DIAGNOSIS — Z1722 Progesterone receptor negative status: Secondary | ICD-10-CM | POA: Diagnosis not present

## 2023-08-13 DIAGNOSIS — C3490 Malignant neoplasm of unspecified part of unspecified bronchus or lung: Secondary | ICD-10-CM

## 2023-08-13 DIAGNOSIS — Z79811 Long term (current) use of aromatase inhibitors: Secondary | ICD-10-CM | POA: Diagnosis not present

## 2023-08-13 DIAGNOSIS — Z170 Estrogen receptor positive status [ER+]: Secondary | ICD-10-CM | POA: Diagnosis not present

## 2023-08-13 DIAGNOSIS — C3432 Malignant neoplasm of lower lobe, left bronchus or lung: Secondary | ICD-10-CM | POA: Diagnosis not present

## 2023-08-13 DIAGNOSIS — Z1732 Human epidermal growth factor receptor 2 negative status: Secondary | ICD-10-CM | POA: Diagnosis not present

## 2023-08-13 DIAGNOSIS — R413 Other amnesia: Secondary | ICD-10-CM | POA: Diagnosis not present

## 2023-08-13 DIAGNOSIS — Z9012 Acquired absence of left breast and nipple: Secondary | ICD-10-CM | POA: Diagnosis not present

## 2023-08-13 LAB — RAD ONC ARIA SESSION SUMMARY
Course Elapsed Days: 4
Plan Fractions Treated to Date: 5
Plan Prescribed Dose Per Fraction: 2 Gy
Plan Total Fractions Prescribed: 30
Plan Total Prescribed Dose: 60 Gy
Reference Point Dosage Given to Date: 10 Gy
Reference Point Session Dosage Given: 2 Gy
Session Number: 5

## 2023-08-13 LAB — CBC WITH DIFFERENTIAL (CANCER CENTER ONLY)
Abs Immature Granulocytes: 0.03 K/uL (ref 0.00–0.07)
Basophils Absolute: 0 K/uL (ref 0.0–0.1)
Basophils Relative: 0 %
Eosinophils Absolute: 0 K/uL (ref 0.0–0.5)
Eosinophils Relative: 0 %
HCT: 33.2 % — ABNORMAL LOW (ref 36.0–46.0)
Hemoglobin: 10.9 g/dL — ABNORMAL LOW (ref 12.0–15.0)
Immature Granulocytes: 0 %
Lymphocytes Relative: 11 %
Lymphs Abs: 0.9 K/uL (ref 0.7–4.0)
MCH: 27.7 pg (ref 26.0–34.0)
MCHC: 32.8 g/dL (ref 30.0–36.0)
MCV: 84.5 fL (ref 80.0–100.0)
Monocytes Absolute: 0.7 K/uL (ref 0.1–1.0)
Monocytes Relative: 10 %
Neutro Abs: 5.8 K/uL (ref 1.7–7.7)
Neutrophils Relative %: 79 %
Platelet Count: 253 K/uL (ref 150–400)
RBC: 3.93 MIL/uL (ref 3.87–5.11)
RDW: 13.2 % (ref 11.5–15.5)
WBC Count: 7.4 K/uL (ref 4.0–10.5)
nRBC: 0 % (ref 0.0–0.2)

## 2023-08-13 LAB — CMP (CANCER CENTER ONLY)
ALT: 8 U/L (ref 0–44)
AST: 15 U/L (ref 15–41)
Albumin: 4.1 g/dL (ref 3.5–5.0)
Alkaline Phosphatase: 67 U/L (ref 38–126)
Anion gap: 5 (ref 5–15)
BUN: 14 mg/dL (ref 8–23)
CO2: 25 mmol/L (ref 22–32)
Calcium: 9.7 mg/dL (ref 8.9–10.3)
Chloride: 98 mmol/L (ref 98–111)
Creatinine: 1.12 mg/dL — ABNORMAL HIGH (ref 0.44–1.00)
GFR, Estimated: 49 mL/min — ABNORMAL LOW (ref >60)
Glucose, Bld: 108 mg/dL — ABNORMAL HIGH (ref 70–99)
Potassium: 4.5 mmol/L (ref 3.5–5.1)
Sodium: 128 mmol/L — ABNORMAL LOW (ref 135–145)
Total Bilirubin: 0.6 mg/dL (ref 0.0–1.2)
Total Protein: 8.8 g/dL — ABNORMAL HIGH (ref 6.5–8.1)

## 2023-08-13 NOTE — Telephone Encounter (Signed)
 CALLED PATIENT TO INFORM OF LAB APPT. FOR TODAY @ 2 PM, SPOKE WITH PATIENT AND SHE IS AWARE OF THIS APPT.

## 2023-08-13 NOTE — Telephone Encounter (Signed)
   Pre-operative Risk Assessment    Patient Name: [name]  DOB: July 12, 1940 MRN: [id number]   Date of last office visit: 04/15/23 OLE HOLTS, MD Date of next office visit: NONE   Request for Surgical Clearance    Procedure:  COLONOSCOPY/ ENDOSCPY  Date of Surgery:  Clearance 09/03/23                                Surgeon:  DR [name] Surgeon's Group or Practice Name:  [facility] Phone number:  [phone] Fax number:  [phone]   Type of Clearance Requested:   - Medical  - Pharmacy:  Hold Apixaban  (Eliquis )     Type of Anesthesia:  PROPOFOL    Additional requests/questions:    SignedLucie DELENA Ku   08/13/2023, 11:05 AM

## 2023-08-16 ENCOUNTER — Other Ambulatory Visit: Payer: Self-pay

## 2023-08-16 ENCOUNTER — Ambulatory Visit
Admission: RE | Admit: 2023-08-16 | Discharge: 2023-08-16 | Disposition: A | Discharge status: Home / Self Care | Source: Ambulatory Visit | Attending: Radiation Oncology | Admitting: Radiation Oncology

## 2023-08-16 DIAGNOSIS — Z1722 Progesterone receptor negative status: Secondary | ICD-10-CM | POA: Diagnosis not present

## 2023-08-16 DIAGNOSIS — Z79811 Long term (current) use of aromatase inhibitors: Secondary | ICD-10-CM | POA: Diagnosis not present

## 2023-08-16 DIAGNOSIS — E871 Hypo-osmolality and hyponatremia: Secondary | ICD-10-CM

## 2023-08-16 DIAGNOSIS — C3432 Malignant neoplasm of lower lobe, left bronchus or lung: Secondary | ICD-10-CM | POA: Diagnosis not present

## 2023-08-16 DIAGNOSIS — R918 Other nonspecific abnormal finding of lung field: Secondary | ICD-10-CM

## 2023-08-16 DIAGNOSIS — R413 Other amnesia: Secondary | ICD-10-CM | POA: Diagnosis not present

## 2023-08-16 DIAGNOSIS — Z9012 Acquired absence of left breast and nipple: Secondary | ICD-10-CM | POA: Diagnosis not present

## 2023-08-16 DIAGNOSIS — Z170 Estrogen receptor positive status [ER+]: Secondary | ICD-10-CM | POA: Diagnosis not present

## 2023-08-16 DIAGNOSIS — Z510 Encounter for antineoplastic radiation therapy: Secondary | ICD-10-CM | POA: Diagnosis not present

## 2023-08-16 DIAGNOSIS — Z1732 Human epidermal growth factor receptor 2 negative status: Secondary | ICD-10-CM | POA: Diagnosis not present

## 2023-08-16 DIAGNOSIS — C50512 Malignant neoplasm of lower-outer quadrant of left female breast: Secondary | ICD-10-CM | POA: Diagnosis not present

## 2023-08-16 DIAGNOSIS — F17220 Nicotine dependence, chewing tobacco, uncomplicated: Secondary | ICD-10-CM | POA: Diagnosis not present

## 2023-08-16 LAB — RAD ONC ARIA SESSION SUMMARY
Course Elapsed Days: 7
Plan Fractions Treated to Date: 6
Plan Prescribed Dose Per Fraction: 2 Gy
Plan Total Fractions Prescribed: 30
Plan Total Prescribed Dose: 60 Gy
Reference Point Dosage Given to Date: 12 Gy
Reference Point Session Dosage Given: 2 Gy
Session Number: 6

## 2023-08-17 ENCOUNTER — Other Ambulatory Visit: Payer: Self-pay

## 2023-08-17 ENCOUNTER — Ambulatory Visit
Admission: RE | Admit: 2023-08-17 | Discharge: 2023-08-17 | Discharge status: Home / Self Care | Source: Ambulatory Visit | Attending: Radiation Oncology | Admitting: Radiation Oncology

## 2023-08-17 ENCOUNTER — Ambulatory Visit
Admission: RE | Admit: 2023-08-17 | Discharge: 2023-08-17 | Disposition: A | Discharge status: Home / Self Care | Source: Ambulatory Visit | Attending: Radiation Oncology | Admitting: Radiation Oncology

## 2023-08-17 DIAGNOSIS — Z510 Encounter for antineoplastic radiation therapy: Secondary | ICD-10-CM | POA: Diagnosis not present

## 2023-08-17 DIAGNOSIS — E871 Hypo-osmolality and hyponatremia: Secondary | ICD-10-CM

## 2023-08-17 DIAGNOSIS — Z1732 Human epidermal growth factor receptor 2 negative status: Secondary | ICD-10-CM | POA: Diagnosis not present

## 2023-08-17 DIAGNOSIS — Z170 Estrogen receptor positive status [ER+]: Secondary | ICD-10-CM | POA: Diagnosis not present

## 2023-08-17 DIAGNOSIS — Z1722 Progesterone receptor negative status: Secondary | ICD-10-CM | POA: Diagnosis not present

## 2023-08-17 DIAGNOSIS — C3432 Malignant neoplasm of lower lobe, left bronchus or lung: Secondary | ICD-10-CM | POA: Diagnosis not present

## 2023-08-17 DIAGNOSIS — Z79811 Long term (current) use of aromatase inhibitors: Secondary | ICD-10-CM | POA: Diagnosis not present

## 2023-08-17 DIAGNOSIS — Z9012 Acquired absence of left breast and nipple: Secondary | ICD-10-CM | POA: Diagnosis not present

## 2023-08-17 DIAGNOSIS — C50512 Malignant neoplasm of lower-outer quadrant of left female breast: Secondary | ICD-10-CM | POA: Diagnosis not present

## 2023-08-17 DIAGNOSIS — F17220 Nicotine dependence, chewing tobacco, uncomplicated: Secondary | ICD-10-CM | POA: Diagnosis not present

## 2023-08-17 DIAGNOSIS — R413 Other amnesia: Secondary | ICD-10-CM | POA: Diagnosis not present

## 2023-08-17 LAB — RAD ONC ARIA SESSION SUMMARY
Course Elapsed Days: 8
Plan Fractions Treated to Date: 7
Plan Prescribed Dose Per Fraction: 2 Gy
Plan Total Fractions Prescribed: 30
Plan Total Prescribed Dose: 60 Gy
Reference Point Dosage Given to Date: 14 Gy
Reference Point Session Dosage Given: 2 Gy
Session Number: 7

## 2023-08-18 ENCOUNTER — Other Ambulatory Visit: Payer: Self-pay

## 2023-08-18 ENCOUNTER — Ambulatory Visit
Admission: RE | Admit: 2023-08-18 | Discharge: 2023-08-18 | Disposition: A | Discharge status: Home / Self Care | Source: Ambulatory Visit | Attending: Radiation Oncology | Admitting: Radiation Oncology

## 2023-08-18 DIAGNOSIS — Z9012 Acquired absence of left breast and nipple: Secondary | ICD-10-CM | POA: Diagnosis not present

## 2023-08-18 DIAGNOSIS — C50512 Malignant neoplasm of lower-outer quadrant of left female breast: Secondary | ICD-10-CM | POA: Diagnosis not present

## 2023-08-18 DIAGNOSIS — Z1732 Human epidermal growth factor receptor 2 negative status: Secondary | ICD-10-CM | POA: Diagnosis not present

## 2023-08-18 DIAGNOSIS — R413 Other amnesia: Secondary | ICD-10-CM | POA: Diagnosis not present

## 2023-08-18 DIAGNOSIS — C3432 Malignant neoplasm of lower lobe, left bronchus or lung: Secondary | ICD-10-CM | POA: Diagnosis not present

## 2023-08-18 DIAGNOSIS — Z510 Encounter for antineoplastic radiation therapy: Secondary | ICD-10-CM | POA: Diagnosis not present

## 2023-08-18 DIAGNOSIS — Z79811 Long term (current) use of aromatase inhibitors: Secondary | ICD-10-CM | POA: Diagnosis not present

## 2023-08-18 DIAGNOSIS — F17220 Nicotine dependence, chewing tobacco, uncomplicated: Secondary | ICD-10-CM | POA: Diagnosis not present

## 2023-08-18 DIAGNOSIS — Z170 Estrogen receptor positive status [ER+]: Secondary | ICD-10-CM | POA: Diagnosis not present

## 2023-08-18 DIAGNOSIS — Z1722 Progesterone receptor negative status: Secondary | ICD-10-CM | POA: Diagnosis not present

## 2023-08-18 LAB — RAD ONC ARIA SESSION SUMMARY
Course Elapsed Days: 9
Plan Fractions Treated to Date: 8
Plan Prescribed Dose Per Fraction: 2 Gy
Plan Total Fractions Prescribed: 30
Plan Total Prescribed Dose: 60 Gy
Reference Point Dosage Given to Date: 16 Gy
Reference Point Session Dosage Given: 2 Gy
Session Number: 8

## 2023-08-18 NOTE — Telephone Encounter (Signed)
 Tried contacting patient to schedule TELEVISIT patient did not answer but left a detailed vm to call back and schedule

## 2023-08-18 NOTE — Telephone Encounter (Signed)
 Patient with diagnosis of aflutter on Eliquis  for anticoagulation.    Procedure:  COLONOSCOPY/ ENDOSCPY  Date of procedure: 09/03/23   CHA2DS2-VASc Score = 6   This indicates a 9.7% annual risk of stroke. The patient's score is based upon: CHF History: 0 HTN History: 1 Diabetes History: 1 Stroke History: 0 Vascular Disease History: 1 Age Score: 2 Gender Score: 1      CrCl 40 ml/min Platelet count 253  Patient has not had an Afib/aflutter ablation within the last 3 months or DCCV within the last 30 days  Per office protocol, patient can hold Eliquis  for 2 days prior to procedure.    **This guidance is not considered finalized until pre-operative APP has relayed final recommendations.**

## 2023-08-18 NOTE — Telephone Encounter (Signed)
   Name: [name]  DOB: 03-Apr-1940  MRN: [id number]  Primary Cardiologist: Gordy Bergamo, MD   Preoperative team, please contact this patient and set up a phone call appointment for further preoperative risk assessment. Please obtain consent and complete medication review. Thank you for your help.  I confirm that guidance regarding antiplatelet and oral anticoagulation therapy has been completed and, if necessary, noted below.  CHA2DS2-VASc Score = 6   This indicates a 9.7% annual risk of stroke. The patient's score is based upon: CHF History: 0 HTN History: 1 Diabetes History: 1 Stroke History: 0 Vascular Disease History: 1 Age Score: 2 Gender Score: 1       CrCl 40 ml/min Platelet count 253   Patient has not had an Afib/aflutter ablation within the last 3 months or DCCV within the last 30 days   Per office protocol, patient can hold Eliquis  for 2 days prior to procedure.    I also confirmed the patient resides in the state of [place] . As per [facility] Medical Board telemedicine laws, the patient must reside in the state in which the provider is licensed.   Lamarr Satterfield, NP 08/18/2023, 11:00 AM [facility]

## 2023-08-19 ENCOUNTER — Other Ambulatory Visit: Payer: Self-pay | Admitting: *Deleted

## 2023-08-19 ENCOUNTER — Inpatient Hospital Stay (HOSPITAL_BASED_OUTPATIENT_CLINIC_OR_DEPARTMENT_OTHER): Admitting: Adult Health

## 2023-08-19 ENCOUNTER — Other Ambulatory Visit: Payer: Self-pay

## 2023-08-19 ENCOUNTER — Inpatient Hospital Stay

## 2023-08-19 ENCOUNTER — Ambulatory Visit
Admission: RE | Admit: 2023-08-19 | Discharge: 2023-08-19 | Disposition: A | Discharge status: Home / Self Care | Source: Ambulatory Visit | Attending: Radiation Oncology | Admitting: Radiation Oncology

## 2023-08-19 ENCOUNTER — Telehealth: Payer: Self-pay | Admitting: *Deleted

## 2023-08-19 ENCOUNTER — Encounter: Payer: Self-pay | Admitting: Adult Health

## 2023-08-19 VITALS — BP 100/52 | HR 89 | Temp 97.7°F | Resp 16 | Ht 67.0 in | Wt 133.2 lb

## 2023-08-19 DIAGNOSIS — C3490 Malignant neoplasm of unspecified part of unspecified bronchus or lung: Secondary | ICD-10-CM

## 2023-08-19 DIAGNOSIS — C3432 Malignant neoplasm of lower lobe, left bronchus or lung: Secondary | ICD-10-CM | POA: Diagnosis not present

## 2023-08-19 DIAGNOSIS — Z9012 Acquired absence of left breast and nipple: Secondary | ICD-10-CM | POA: Diagnosis not present

## 2023-08-19 DIAGNOSIS — C50512 Malignant neoplasm of lower-outer quadrant of left female breast: Secondary | ICD-10-CM

## 2023-08-19 DIAGNOSIS — Z1722 Progesterone receptor negative status: Secondary | ICD-10-CM | POA: Diagnosis not present

## 2023-08-19 DIAGNOSIS — Z170 Estrogen receptor positive status [ER+]: Secondary | ICD-10-CM

## 2023-08-19 DIAGNOSIS — Z510 Encounter for antineoplastic radiation therapy: Secondary | ICD-10-CM | POA: Diagnosis not present

## 2023-08-19 DIAGNOSIS — Z1732 Human epidermal growth factor receptor 2 negative status: Secondary | ICD-10-CM | POA: Diagnosis not present

## 2023-08-19 DIAGNOSIS — Z79811 Long term (current) use of aromatase inhibitors: Secondary | ICD-10-CM | POA: Diagnosis not present

## 2023-08-19 DIAGNOSIS — R413 Other amnesia: Secondary | ICD-10-CM | POA: Diagnosis not present

## 2023-08-19 DIAGNOSIS — F17220 Nicotine dependence, chewing tobacco, uncomplicated: Secondary | ICD-10-CM | POA: Diagnosis not present

## 2023-08-19 LAB — RAD ONC ARIA SESSION SUMMARY
Course Elapsed Days: 10
Plan Fractions Treated to Date: 9
Plan Prescribed Dose Per Fraction: 2 Gy
Plan Total Fractions Prescribed: 30
Plan Total Prescribed Dose: 60 Gy
Reference Point Dosage Given to Date: 18 Gy
Reference Point Session Dosage Given: 2 Gy
Session Number: 9

## 2023-08-19 LAB — MAGNESIUM: Magnesium: 2.1 mg/dL (ref 1.7–2.4)

## 2023-08-19 LAB — CBC WITH DIFFERENTIAL (CANCER CENTER ONLY)
Abs Immature Granulocytes: 0.02 K/uL (ref 0.00–0.07)
Basophils Absolute: 0 K/uL (ref 0.0–0.1)
Basophils Relative: 1 %
Eosinophils Absolute: 0.1 K/uL (ref 0.0–0.5)
Eosinophils Relative: 2 %
HCT: 34.9 % — ABNORMAL LOW (ref 36.0–46.0)
Hemoglobin: 11.7 g/dL — ABNORMAL LOW (ref 12.0–15.0)
Immature Granulocytes: 1 %
Lymphocytes Relative: 15 %
Lymphs Abs: 0.7 K/uL (ref 0.7–4.0)
MCH: 28.3 pg (ref 26.0–34.0)
MCHC: 33.5 g/dL (ref 30.0–36.0)
MCV: 84.3 fL (ref 80.0–100.0)
Monocytes Absolute: 0.5 K/uL (ref 0.1–1.0)
Monocytes Relative: 11 %
Neutro Abs: 3.2 K/uL (ref 1.7–7.7)
Neutrophils Relative %: 70 %
Platelet Count: 277 K/uL (ref 150–400)
RBC: 4.14 MIL/uL (ref 3.87–5.11)
RDW: 13.2 % (ref 11.5–15.5)
WBC Count: 4.4 K/uL (ref 4.0–10.5)
nRBC: 0 % (ref 0.0–0.2)

## 2023-08-19 LAB — CMP (CANCER CENTER ONLY)
ALT: 9 U/L (ref 0–44)
AST: 17 U/L (ref 15–41)
Albumin: 4.2 g/dL (ref 3.5–5.0)
Alkaline Phosphatase: 65 U/L (ref 38–126)
Anion gap: 9 (ref 5–15)
BUN: 13 mg/dL (ref 8–23)
CO2: 23 mmol/L (ref 22–32)
Calcium: 10 mg/dL (ref 8.9–10.3)
Chloride: 98 mmol/L (ref 98–111)
Creatinine: 1.15 mg/dL — ABNORMAL HIGH (ref 0.44–1.00)
GFR, Estimated: 48 mL/min — ABNORMAL LOW (ref >60)
Glucose, Bld: 77 mg/dL (ref 70–99)
Potassium: 4.2 mmol/L (ref 3.5–5.1)
Sodium: 130 mmol/L — ABNORMAL LOW (ref 135–145)
Total Bilirubin: 0.4 mg/dL (ref 0.0–1.2)
Total Protein: 9.2 g/dL — ABNORMAL HIGH (ref 6.5–8.1)

## 2023-08-19 LAB — D-DIMER, QUANTITATIVE: D-Dimer, Quant: 12.84 ug{FEU}/mL — ABNORMAL HIGH (ref 0.00–0.50)

## 2023-08-19 NOTE — Progress Notes (Signed)
 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 1.1 cm probable cystic appearing mass lies slightly above and lateral to the lumpectomy bed calcification. Aspiration to confirm this as a benign cyst is recommended.   01/08/2023 Breast US    Ultrasound-guided cyst aspiration of the 1.1 cm presumed complicated cyst in the left breast at 10 o'clock, 2 cm the nipple. If this does not aspirate, ultrasound-guided core needle biopsy would be indicated.     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 VUS identified in BRCA2 p.E1605K (c.4813G>A) and ALK p.T656I (c.1967C>T). The CancerNext-Expanded gene panel offered by [facility] and includes sequencing, rearrangement, and RNA analysis for the following 76 genes: AIP, ALK, APC, ATM, AXIN2, BAP1, BARD1, BMPR1A, BRCA1, BRCA2, BRIP1, CDC73, CDH1, CDK4, CDKN1B, CDKN2A, CEBPA, CHEK2, CTNNA1, DDX41, DICER1, ETV6, FH, FLCN, GATA2, LZTR1, MAX, MBD4, MEN1, MET, MLH1, MSH2, MSH3, MSH6, MUTYH, NF1, NF2, NTHL1, PALB2, PHOX2B, PMS2, POT1, PRKAR1A, PTCH1, PTEN, RAD51C, RAD51D, RB1, RET, RUNX1, SDHA, SDHAF2, SDHB, SDHC, SDHD, SMAD4, SMARCA4, SMARCB1, SMARCE1, STK11, SUFU, TMEM127, TP53, TSC1, TSC2, VHL, and WT1 (sequencing and deletion/duplication); EGFR, HOXB13, KIT, MITF, PDGFRA, POLD1, and POLE (sequencing only); EPCAM and GREM1 (deletion/duplication only). Report date 03/08/23.    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  History of Present Illness [name] is an 83 year old female with lung cancer who presents with shortness of breath and near syncope. She was referred by Dr. Loretha for follow-up post radiation therapy.  She experiences shortness of breath and near syncope, particularly when standing, which required her to use a wheelchair yesterday. These symptoms are  exacerbated by activities such as climbing stairs to her second-floor residence. No chest pain or palpitations are present.  She is undergoing radiation  therapy for lung cancer and has noted a decrease in appetite, contributing to weight loss. She is supplementing her diet with nutritional drinks like Ensure, consuming one or two bottles daily. She acknowledges not drinking enough fluids but attempts to drink at least two bottles a day. A recent onset of cough is noted. No fever or chills are present.  Her current medication regimen includes anastrozole , which she continues to take as prescribed.     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) 02/18/2023   Long term (current) use of anticoagulants 10/13/2022   Sepsis due to pneumonia (HCC) 10/11/2022   AKI (acute kidney injury) (HCC) 10/11/2022   Paroxysmal atrial flutter (HCC) 10/11/2022   Diarrhea 10/11/2022   Controlled type 2 diabetes mellitus without complication, without long-term current use of insulin  (HCC) 10/11/2022   Mixed hyperlipidemia 10/11/2022   Atherosclerosis of aorta (HCC) 10/11/2022   Diabetes mellitus type 2, noninsulin dependent (HCC) 10/11/2022   Benign hypertension 10/11/2022   Acute febrile illness 10/11/2022   Atrial flutter with rapid ventricular response (HCC) 10/11/2022   Chronic diastolic CHF (congestive heart failure) (HCC) 10/11/2022   Syncope 04/25/2022   Near syncope 02/18/2019   Hyponatremia 02/18/2019   ILD (interstitial lung disease) (HCC) 07/30/2016   Dyspnea 07/16/2016   Bibasilar crackles 07/16/2016   History of systemic lupus erythematosus (SLE) (HCC) 07/16/2016   Cough 07/16/2016   Encounter for monitoring ACE-inhibitor therapy  07/16/2016   History of asthma 07/16/2016   Hypothyroidism 01/03/2015   Primary hypertension 01/03/2015   UTI (urinary tract infection) 01/03/2015   Drug-induced hypersensitivity reaction 01/03/2015   Lupus 01/03/2015   Pyrexia 01/02/2015    is allergic to ciprofloxacin, crestor  [rosuvastatin ], lisinopril , and ofev  [nintedanib].  MEDICAL HISTORY: Past Medical History:  Diagnosis Date   Acute bronchitis 12/17/2021   Acute cystitis 02/20/2019   Arthritis    Atrial flutter (HCC)    Breast cancer (HCC)    left  2009 and 2025   Diabetes mellitus without complication (HCC)    Dyspnea    mostly with exertion   GERD (gastroesophageal reflux disease)    Hypertension    Hypothyroidism    Libman Sacks endocarditis (HCC)    Lupus    Pneumonia 07/31/2021    SURGICAL HISTORY: Past Surgical History:  Procedure Laterality Date   BREAST BIOPSY Left 01/15/2023   US  LT BREAST BX W LOC DEV 1ST LESION IMG BX SPEC US  GUIDE 01/15/2023 [facility] MAMMOGRAPHY   BREAST LUMPECTOMY Left 2009   BRONCHIAL BIOPSY  06/29/2023   Procedure: BRONCHOSCOPY, WITH BIOPSY;  Surgeon: [name], MD;  Location: [facility] ENDOSCOPY;  Service: Pulmonary;;   BRONCHIAL BRUSHINGS  06/29/2023   Procedure: BRONCHOSCOPY, WITH BRUSH BIOPSY;  Surgeon: [name], MD;  Location: [facility] ENDOSCOPY;  Service: Pulmonary;;   BRONCHIAL NEEDLE ASPIRATION BIOPSY  06/29/2023   Procedure: BRONCHOSCOPY, WITH NEEDLE ASPIRATION BIOPSY;  Surgeon: [name], MD;  Location: [facility] ENDOSCOPY;  Service: Pulmonary;;  IR 3D INDEPENDENT WKST  05/29/2021   IR ANGIO INTRA EXTRACRAN SEL COM CAROTID INNOMINATE BILAT MOD SED  05/29/2021   IR ANGIO VERTEBRAL SEL VERTEBRAL BILAT MOD SED  05/29/2021   SIMPLE MASTECTOMY WITH AXILLARY SENTINEL NODE BIOPSY Left 03/31/2023   Procedure: LEFT MASTECTOMY;  Surgeon: Vernetta Berg, MD;  Location: [facility] OR;  Service: General;  Laterality: Left;  LMA PEC BLOCK   VIDEO BRONCHOSCOPY WITH ENDOBRONCHIAL NAVIGATION Left  06/29/2023   Procedure: VIDEO BRONCHOSCOPY WITH ENDOBRONCHIAL NAVIGATION;  Surgeon: [name], MD;  Location: [facility] ENDOSCOPY;  Service: Pulmonary;  Laterality: Left;   VIDEO BRONCHOSCOPY WITH ENDOBRONCHIAL ULTRASOUND Bilateral 06/29/2023   Procedure: BRONCHOSCOPY, WITH EBUS;  Surgeon: [name], MD;  Location: [facility] ENDOSCOPY;  Service: Pulmonary;  Laterality: Bilateral;    SOCIAL HISTORY: Social History   Socioeconomic History   Marital status: Single    Spouse name: Not on file   Number of children: 2   Years of education: Not on file   Highest education level: Not on file  Occupational History   Not on file  Tobacco Use   Smoking status: Never    Passive exposure: Never   Smokeless tobacco: Current    Types: Chew  Vaping Use   Vaping status: Never Used  Substance and Sexual Activity   Alcohol  use: No   Drug use: No   Sexual activity: Not Currently  Other Topics Concern   Not on file  Social History Narrative   Not on file   Social Drivers of Health   Financial Resource Strain: Not on file  Food Insecurity: Food Insecurity Present (07/22/2023)   Hunger Vital Sign    Worried About Running Out of Food in the Last Year: Sometimes true    Ran Out of Food in the Last Year: Sometimes true  Transportation Needs: No Transportation Needs (07/22/2023)   PRAPARE - Administrator, Civil Service (Medical): No    Lack of Transportation (Non-Medical): No  Physical Activity: Not on file  Stress: Not on file  Social Connections: Unknown (06/19/2023)   Social Connection and Isolation Panel    Frequency of Communication with Friends and Family: More than three times a week    Frequency of Social Gatherings with Friends and Family: More than three times a week    Attends Religious Services: More than 4 times per year    Active Member of [employer] or Organizations: No    Attends Banker Meetings: 1 to 4 times per year    Marital Status: Patient declined   Intimate Partner Violence: Not At Risk (07/22/2023)   Humiliation, Afraid, Rape, and Kick questionnaire    Fear of Current or Ex-Partner: No    Emotionally Abused: No    Physically Abused: No    Sexually Abused: No    FAMILY HISTORY: Family History  Problem Relation Age of Onset   Cancer Father        prostate   Prostate cancer Father 71 - 60       metaststic   Asthma Brother    Cancer Daughter        breast   Breast cancer Daughter 37    Review of Systems  Constitutional:  Negative for appetite change, chills, fatigue, fever and unexpected weight change.  HENT:   Negative for hearing loss, lump/mass and trouble swallowing.   Eyes:  Negative for eye problems and icterus.  Respiratory:  Negative for chest tightness, cough and shortness of  breath.   Cardiovascular:  Negative for chest pain, leg swelling and palpitations.  Gastrointestinal:  Negative for abdominal distention, abdominal pain, constipation, diarrhea, nausea and vomiting.  Endocrine: Negative for hot flashes.  Genitourinary:  Negative for difficulty urinating.   Musculoskeletal:  Negative for arthralgias.  Skin:  Negative for itching and rash.  Neurological:  Negative for dizziness, extremity weakness, headaches and numbness.  Hematological:  Negative for adenopathy. Does not bruise/bleed easily.  Psychiatric/Behavioral:  Negative for depression. The patient is not nervous/anxious.       PHYSICAL EXAMINATION    Vitals:   08/19/23 1140  BP: 135/66  Pulse: 82  Resp: 16  Temp: 97.7 F (36.5 C)  SpO2: 100%    Physical Exam Constitutional:      General: She is not in acute distress.    Appearance: Normal appearance. She is not toxic-appearing.  HENT:     Head: Normocephalic and atraumatic.     Mouth/Throat:     Mouth: Mucous membranes are moist.     Pharynx: Oropharynx is clear. No oropharyngeal exudate or posterior oropharyngeal erythema.  Eyes:     General: No scleral icterus. Cardiovascular:      Rate and Rhythm: Normal rate and regular rhythm.     Pulses: Normal pulses.     Heart sounds: Normal heart sounds.  Pulmonary:     Effort: Pulmonary effort is normal.     Breath sounds: Normal breath sounds.  Abdominal:     General: Abdomen is flat. Bowel sounds are normal. There is no distension.     Palpations: Abdomen is soft.     Tenderness: There is no abdominal tenderness.  Musculoskeletal:        General: No swelling.     Cervical back: Neck supple.  Lymphadenopathy:     Cervical: No cervical adenopathy.  Skin:    General: Skin is warm and dry.     Findings: No rash.  Neurological:     General: No focal deficit present.     Mental Status: She is alert.  Psychiatric:        Mood and Affect: Mood normal.        Behavior: Behavior normal.     LABORATORY DATA:  CBC    Component Value Date/Time   WBC 7.4 08/13/2023 1339   WBC 12.9 (H) 06/20/2023 0553   RBC 3.93 08/13/2023 1339   HGB 10.9 (L) 08/13/2023 1339   HGB 11.7 01/07/2023 1439   HCT 33.2 (L) 08/13/2023 1339   HCT 36.4 01/07/2023 1439   PLT 253 08/13/2023 1339   PLT 257 01/07/2023 1439   MCV 84.5 08/13/2023 1339   MCV 89 01/07/2023 1439   MCH 27.7 08/13/2023 1339   MCHC 32.8 08/13/2023 1339   RDW 13.2 08/13/2023 1339   RDW 13.9 01/07/2023 1439   LYMPHSABS 0.9 08/13/2023 1339   LYMPHSABS 1.7 04/25/2022 1223   MONOABS 0.7 08/13/2023 1339   EOSABS 0.0 08/13/2023 1339   EOSABS 0.1 04/25/2022 1223   BASOSABS 0.0 08/13/2023 1339   BASOSABS 0.0 04/25/2022 1223    CMP     Component Value Date/Time   NA 128 (L) 08/13/2023 1339   NA 138 01/07/2023 1439   K 4.5 08/13/2023 1339   CL 98 08/13/2023 1339   CO2 25 08/13/2023 1339   GLUCOSE 108 (H) 08/13/2023 1339   BUN 14 08/13/2023 1339   BUN 12 01/07/2023 1439   CREATININE 1.12 (H) 08/13/2023 1339   CALCIUM  9.7 08/13/2023 1339  PROT 8.8 (H) 08/13/2023 1339   PROT 9.0 (H) 04/25/2022 1223   ALBUMIN 4.1 08/13/2023 1339   ALBUMIN 4.3 04/25/2022 1223    AST 15 08/13/2023 1339   ALT 8 08/13/2023 1339   ALKPHOS 67 08/13/2023 1339   BILITOT 0.6 08/13/2023 1339   GFRNONAA 49 (L) 08/13/2023 1339   GFRAA 55 (L) 02/20/2019 0248     ASSESSMENT and THERAPY PLAN:   Assessment and Plan Assessment & Plan Lung cancer Undergoing radiation therapy. Experiencing progressive shortness of breath and near syncope. - Recheck labs, including sodium levels. - Check orthostatic VS - Check ambulatory oxygenation - Consider IV fluids as indicated based on orthostatic VS and lab testing results  Cough Cough possibly related to radiation therapy. - Monitor oxygen  levels while walking to assess for desaturation.  Weight loss Decreased appetite and weight loss likely due to cancer treatment and decreased oral intake. - Request nutrition consultation to address weight loss.  Breast cancer Continuing anastrozole  as prescribed by Dr. Loretha.  F/u with Dr. Loretha in 10/2023 as scheduled, or sooner if she begins to feel worse.    All questions were answered. The patient knows to call the clinic with any problems, questions or concerns. We can certainly see the patient much sooner if necessary.  Total encounter time:30 minutes*in face-to-face visit time, chart review, lab review, care coordination, order entry, and documentation of the encounter time.    Morna Kendall, NP 08/19/23 11:47 AM Medical Oncology and Hematology [facility] [address] [place], [place] [address] Tel. [phone]    Fax. [phone]  *Total Encounter Time as defined by the Centers for Medicare and Medicaid Services includes, in addition to the face-to-face time of a patient visit (documented in the note above) non-face-to-face time: obtaining and reviewing outside history, ordering and reviewing medications, tests or procedures, care coordination (communications with other health care professionals or caregivers) and documentation in the medical record.

## 2023-08-19 NOTE — Telephone Encounter (Signed)
 Per Morna Kendall, NP, called pt to f/u on Eliquis  regimen and confirm if pt is taking medication as prescribed. After several attempts. Called pt daughter and spoke with her. She confirmed with pt that she is taking medication as prescribed and reminded pt that she will receive IVF on Friday 7/24 at 1215. Pt and daughter verbalized understanding.

## 2023-08-20 ENCOUNTER — Ambulatory Visit
Admission: RE | Admit: 2023-08-20 | Discharge: 2023-08-20 | Disposition: A | Discharge status: Home / Self Care | Source: Ambulatory Visit | Attending: Radiation Oncology | Admitting: Radiation Oncology

## 2023-08-20 ENCOUNTER — Inpatient Hospital Stay

## 2023-08-20 ENCOUNTER — Other Ambulatory Visit: Payer: Self-pay

## 2023-08-20 DIAGNOSIS — C3432 Malignant neoplasm of lower lobe, left bronchus or lung: Secondary | ICD-10-CM | POA: Diagnosis not present

## 2023-08-20 DIAGNOSIS — Z510 Encounter for antineoplastic radiation therapy: Secondary | ICD-10-CM | POA: Diagnosis not present

## 2023-08-20 DIAGNOSIS — Z170 Estrogen receptor positive status [ER+]: Secondary | ICD-10-CM | POA: Diagnosis not present

## 2023-08-20 DIAGNOSIS — Z1722 Progesterone receptor negative status: Secondary | ICD-10-CM | POA: Diagnosis not present

## 2023-08-20 DIAGNOSIS — R413 Other amnesia: Secondary | ICD-10-CM | POA: Diagnosis not present

## 2023-08-20 DIAGNOSIS — Z79811 Long term (current) use of aromatase inhibitors: Secondary | ICD-10-CM | POA: Diagnosis not present

## 2023-08-20 DIAGNOSIS — Z1732 Human epidermal growth factor receptor 2 negative status: Secondary | ICD-10-CM | POA: Diagnosis not present

## 2023-08-20 DIAGNOSIS — C50512 Malignant neoplasm of lower-outer quadrant of left female breast: Secondary | ICD-10-CM | POA: Diagnosis not present

## 2023-08-20 DIAGNOSIS — F17220 Nicotine dependence, chewing tobacco, uncomplicated: Secondary | ICD-10-CM | POA: Diagnosis not present

## 2023-08-20 DIAGNOSIS — C3490 Malignant neoplasm of unspecified part of unspecified bronchus or lung: Secondary | ICD-10-CM

## 2023-08-20 DIAGNOSIS — Z9012 Acquired absence of left breast and nipple: Secondary | ICD-10-CM | POA: Diagnosis not present

## 2023-08-20 LAB — RAD ONC ARIA SESSION SUMMARY
Course Elapsed Days: 11
Plan Fractions Treated to Date: 10
Plan Prescribed Dose Per Fraction: 2 Gy
Plan Total Fractions Prescribed: 30
Plan Total Prescribed Dose: 60 Gy
Reference Point Dosage Given to Date: 20 Gy
Reference Point Session Dosage Given: 2 Gy
Session Number: 10

## 2023-08-20 MED ORDER — SODIUM CHLORIDE 0.9 % IV SOLN: INTRAVENOUS | Status: DC

## 2023-08-20 NOTE — Telephone Encounter (Signed)
 Left message for patient to call back

## 2023-08-23 ENCOUNTER — Inpatient Hospital Stay

## 2023-08-23 ENCOUNTER — Encounter: Payer: Self-pay | Admitting: Adult Health

## 2023-08-23 ENCOUNTER — Ambulatory Visit
Admission: RE | Admit: 2023-08-23 | Discharge: 2023-08-23 | Disposition: A | Discharge status: Home / Self Care | Source: Ambulatory Visit | Attending: Radiation Oncology | Admitting: Radiation Oncology

## 2023-08-23 ENCOUNTER — Other Ambulatory Visit: Payer: Self-pay

## 2023-08-23 ENCOUNTER — Other Ambulatory Visit: Payer: Self-pay | Admitting: Radiation Oncology

## 2023-08-23 DIAGNOSIS — Z9012 Acquired absence of left breast and nipple: Secondary | ICD-10-CM | POA: Diagnosis not present

## 2023-08-23 DIAGNOSIS — Z170 Estrogen receptor positive status [ER+]: Secondary | ICD-10-CM | POA: Diagnosis not present

## 2023-08-23 DIAGNOSIS — F17220 Nicotine dependence, chewing tobacco, uncomplicated: Secondary | ICD-10-CM | POA: Diagnosis not present

## 2023-08-23 DIAGNOSIS — R413 Other amnesia: Secondary | ICD-10-CM | POA: Diagnosis not present

## 2023-08-23 DIAGNOSIS — Z79811 Long term (current) use of aromatase inhibitors: Secondary | ICD-10-CM | POA: Diagnosis not present

## 2023-08-23 DIAGNOSIS — Z1722 Progesterone receptor negative status: Secondary | ICD-10-CM | POA: Diagnosis not present

## 2023-08-23 DIAGNOSIS — C50512 Malignant neoplasm of lower-outer quadrant of left female breast: Secondary | ICD-10-CM | POA: Diagnosis not present

## 2023-08-23 DIAGNOSIS — Z1732 Human epidermal growth factor receptor 2 negative status: Secondary | ICD-10-CM | POA: Diagnosis not present

## 2023-08-23 DIAGNOSIS — C3432 Malignant neoplasm of lower lobe, left bronchus or lung: Secondary | ICD-10-CM | POA: Diagnosis not present

## 2023-08-23 DIAGNOSIS — Z510 Encounter for antineoplastic radiation therapy: Secondary | ICD-10-CM | POA: Diagnosis not present

## 2023-08-23 LAB — RAD ONC ARIA SESSION SUMMARY
Course Elapsed Days: 14
Plan Fractions Treated to Date: 11
Plan Prescribed Dose Per Fraction: 2 Gy
Plan Total Fractions Prescribed: 30
Plan Total Prescribed Dose: 60 Gy
Reference Point Dosage Given to Date: 22 Gy
Reference Point Session Dosage Given: 2 Gy
Session Number: 11

## 2023-08-23 MED ORDER — SUCRALFATE 1 G PO TABS: 1.0000 g | ORAL_TABLET | Freq: Three times a day (TID) | ORAL | 1 refills | Status: DC

## 2023-08-23 MED ORDER — LIDOCAINE VISCOUS HCL 2 % MT SOLN: 15.0000 mL | OROMUCOSAL | 0 refills | Status: DC | PRN

## 2023-08-24 ENCOUNTER — Ambulatory Visit

## 2023-08-24 ENCOUNTER — Ambulatory Visit
Admission: RE | Admit: 2023-08-24 | Discharge: 2023-08-24 | Disposition: A | Discharge status: Home / Self Care | Source: Ambulatory Visit | Attending: Radiation Oncology | Admitting: Radiation Oncology

## 2023-08-24 ENCOUNTER — Inpatient Hospital Stay

## 2023-08-24 ENCOUNTER — Other Ambulatory Visit: Payer: Self-pay

## 2023-08-24 DIAGNOSIS — R413 Other amnesia: Secondary | ICD-10-CM | POA: Diagnosis not present

## 2023-08-24 DIAGNOSIS — C50512 Malignant neoplasm of lower-outer quadrant of left female breast: Secondary | ICD-10-CM | POA: Diagnosis not present

## 2023-08-24 DIAGNOSIS — Z9012 Acquired absence of left breast and nipple: Secondary | ICD-10-CM | POA: Diagnosis not present

## 2023-08-24 DIAGNOSIS — F17220 Nicotine dependence, chewing tobacco, uncomplicated: Secondary | ICD-10-CM | POA: Diagnosis not present

## 2023-08-24 DIAGNOSIS — C3432 Malignant neoplasm of lower lobe, left bronchus or lung: Secondary | ICD-10-CM | POA: Diagnosis not present

## 2023-08-24 DIAGNOSIS — Z1722 Progesterone receptor negative status: Secondary | ICD-10-CM | POA: Diagnosis not present

## 2023-08-24 DIAGNOSIS — Z170 Estrogen receptor positive status [ER+]: Secondary | ICD-10-CM | POA: Diagnosis not present

## 2023-08-24 DIAGNOSIS — Z510 Encounter for antineoplastic radiation therapy: Secondary | ICD-10-CM | POA: Diagnosis not present

## 2023-08-24 DIAGNOSIS — Z79811 Long term (current) use of aromatase inhibitors: Secondary | ICD-10-CM | POA: Diagnosis not present

## 2023-08-24 DIAGNOSIS — Z1732 Human epidermal growth factor receptor 2 negative status: Secondary | ICD-10-CM | POA: Diagnosis not present

## 2023-08-24 LAB — RAD ONC ARIA SESSION SUMMARY
Course Elapsed Days: 15
Plan Fractions Treated to Date: 12
Plan Prescribed Dose Per Fraction: 2 Gy
Plan Total Fractions Prescribed: 30
Plan Total Prescribed Dose: 60 Gy
Reference Point Dosage Given to Date: 24 Gy
Reference Point Session Dosage Given: 2 Gy
Session Number: 12

## 2023-08-24 NOTE — Telephone Encounter (Signed)
 2nd clearance request received.

## 2023-08-25 ENCOUNTER — Other Ambulatory Visit: Payer: Self-pay

## 2023-08-25 ENCOUNTER — Inpatient Hospital Stay

## 2023-08-25 ENCOUNTER — Ambulatory Visit
Admission: RE | Admit: 2023-08-25 | Discharge: 2023-08-25 | Disposition: A | Discharge status: Home / Self Care | Source: Ambulatory Visit | Attending: Radiation Oncology | Admitting: Radiation Oncology

## 2023-08-25 DIAGNOSIS — C50512 Malignant neoplasm of lower-outer quadrant of left female breast: Secondary | ICD-10-CM | POA: Diagnosis not present

## 2023-08-25 DIAGNOSIS — F17220 Nicotine dependence, chewing tobacco, uncomplicated: Secondary | ICD-10-CM | POA: Diagnosis not present

## 2023-08-25 DIAGNOSIS — Z1722 Progesterone receptor negative status: Secondary | ICD-10-CM | POA: Diagnosis not present

## 2023-08-25 DIAGNOSIS — R413 Other amnesia: Secondary | ICD-10-CM | POA: Diagnosis not present

## 2023-08-25 DIAGNOSIS — Z510 Encounter for antineoplastic radiation therapy: Secondary | ICD-10-CM | POA: Diagnosis not present

## 2023-08-25 DIAGNOSIS — Z1732 Human epidermal growth factor receptor 2 negative status: Secondary | ICD-10-CM | POA: Diagnosis not present

## 2023-08-25 DIAGNOSIS — Z9012 Acquired absence of left breast and nipple: Secondary | ICD-10-CM | POA: Diagnosis not present

## 2023-08-25 DIAGNOSIS — Z170 Estrogen receptor positive status [ER+]: Secondary | ICD-10-CM | POA: Diagnosis not present

## 2023-08-25 DIAGNOSIS — C3432 Malignant neoplasm of lower lobe, left bronchus or lung: Secondary | ICD-10-CM | POA: Diagnosis not present

## 2023-08-25 DIAGNOSIS — Z79811 Long term (current) use of aromatase inhibitors: Secondary | ICD-10-CM | POA: Diagnosis not present

## 2023-08-25 LAB — RAD ONC ARIA SESSION SUMMARY
Course Elapsed Days: 16
Plan Fractions Treated to Date: 13
Plan Prescribed Dose Per Fraction: 2 Gy
Plan Total Fractions Prescribed: 30
Plan Total Prescribed Dose: 60 Gy
Reference Point Dosage Given to Date: 26 Gy
Reference Point Session Dosage Given: 2 Gy
Session Number: 13

## 2023-08-26 ENCOUNTER — Inpatient Hospital Stay

## 2023-08-26 ENCOUNTER — Ambulatory Visit
Admission: RE | Admit: 2023-08-26 | Discharge: 2023-08-26 | Disposition: A | Discharge status: Home / Self Care | Source: Ambulatory Visit | Attending: Radiation Oncology | Admitting: Radiation Oncology

## 2023-08-26 ENCOUNTER — Other Ambulatory Visit: Payer: Self-pay

## 2023-08-26 DIAGNOSIS — J432 Centrilobular emphysema: Secondary | ICD-10-CM | POA: Diagnosis not present

## 2023-08-26 DIAGNOSIS — E1141 Type 2 diabetes mellitus with diabetic mononeuropathy: Secondary | ICD-10-CM | POA: Diagnosis not present

## 2023-08-26 DIAGNOSIS — F17220 Nicotine dependence, chewing tobacco, uncomplicated: Secondary | ICD-10-CM | POA: Diagnosis not present

## 2023-08-26 DIAGNOSIS — Z1732 Human epidermal growth factor receptor 2 negative status: Secondary | ICD-10-CM | POA: Diagnosis not present

## 2023-08-26 DIAGNOSIS — Z79811 Long term (current) use of aromatase inhibitors: Secondary | ICD-10-CM | POA: Diagnosis not present

## 2023-08-26 DIAGNOSIS — C3432 Malignant neoplasm of lower lobe, left bronchus or lung: Secondary | ICD-10-CM | POA: Diagnosis not present

## 2023-08-26 DIAGNOSIS — Z9012 Acquired absence of left breast and nipple: Secondary | ICD-10-CM | POA: Diagnosis not present

## 2023-08-26 DIAGNOSIS — Z1722 Progesterone receptor negative status: Secondary | ICD-10-CM | POA: Diagnosis not present

## 2023-08-26 DIAGNOSIS — R413 Other amnesia: Secondary | ICD-10-CM | POA: Diagnosis not present

## 2023-08-26 DIAGNOSIS — Z510 Encounter for antineoplastic radiation therapy: Secondary | ICD-10-CM | POA: Diagnosis not present

## 2023-08-26 DIAGNOSIS — Z170 Estrogen receptor positive status [ER+]: Secondary | ICD-10-CM | POA: Diagnosis not present

## 2023-08-26 DIAGNOSIS — C50512 Malignant neoplasm of lower-outer quadrant of left female breast: Secondary | ICD-10-CM | POA: Diagnosis not present

## 2023-08-26 DIAGNOSIS — E1169 Type 2 diabetes mellitus with other specified complication: Secondary | ICD-10-CM | POA: Diagnosis not present

## 2023-08-26 DIAGNOSIS — I10 Essential (primary) hypertension: Secondary | ICD-10-CM | POA: Diagnosis not present

## 2023-08-26 LAB — RAD ONC ARIA SESSION SUMMARY
Course Elapsed Days: 17
Plan Fractions Treated to Date: 14
Plan Prescribed Dose Per Fraction: 2 Gy
Plan Total Fractions Prescribed: 30
Plan Total Prescribed Dose: 60 Gy
Reference Point Dosage Given to Date: 28 Gy
Reference Point Session Dosage Given: 2 Gy
Session Number: 14

## 2023-08-26 NOTE — Telephone Encounter (Signed)
 3rd clearance request received

## 2023-08-26 NOTE — Telephone Encounter (Signed)
 Tried contacting patient to schedule televisit no answer called patient's daughter she answered I explained to patient's daughter we have been trying to get in contact with patient and to verify if we had the correct phone number daughter stated she was going to call requesting office because they were unknown procedure is scheduled and patient has already something scheduled for that day and she will call us  back once she speaks to them to update us  if clearance is needed

## 2023-08-27 ENCOUNTER — Inpatient Hospital Stay: Attending: Hematology and Oncology

## 2023-08-27 ENCOUNTER — Other Ambulatory Visit: Payer: Self-pay

## 2023-08-27 ENCOUNTER — Ambulatory Visit
Admission: RE | Admit: 2023-08-27 | Discharge: 2023-08-27 | Disposition: A | Discharge status: Home / Self Care | Source: Ambulatory Visit | Attending: Radiation Oncology | Admitting: Radiation Oncology

## 2023-08-27 DIAGNOSIS — E871 Hypo-osmolality and hyponatremia: Secondary | ICD-10-CM | POA: Diagnosis not present

## 2023-08-27 DIAGNOSIS — F17220 Nicotine dependence, chewing tobacco, uncomplicated: Secondary | ICD-10-CM | POA: Diagnosis not present

## 2023-08-27 DIAGNOSIS — C3432 Malignant neoplasm of lower lobe, left bronchus or lung: Secondary | ICD-10-CM | POA: Diagnosis not present

## 2023-08-27 DIAGNOSIS — C3490 Malignant neoplasm of unspecified part of unspecified bronchus or lung: Secondary | ICD-10-CM | POA: Diagnosis not present

## 2023-08-27 DIAGNOSIS — Z510 Encounter for antineoplastic radiation therapy: Secondary | ICD-10-CM | POA: Insufficient documentation

## 2023-08-27 LAB — RAD ONC ARIA SESSION SUMMARY
Course Elapsed Days: 18
Plan Fractions Treated to Date: 15
Plan Prescribed Dose Per Fraction: 2 Gy
Plan Total Fractions Prescribed: 30
Plan Total Prescribed Dose: 60 Gy
Reference Point Dosage Given to Date: 30 Gy
Reference Point Session Dosage Given: 2 Gy
Session Number: 15

## 2023-08-30 ENCOUNTER — Inpatient Hospital Stay

## 2023-08-30 ENCOUNTER — Ambulatory Visit

## 2023-08-31 ENCOUNTER — Other Ambulatory Visit: Payer: Self-pay

## 2023-08-31 ENCOUNTER — Telehealth: Payer: Self-pay

## 2023-08-31 ENCOUNTER — Ambulatory Visit
Admission: RE | Admit: 2023-08-31 | Discharge: 2023-08-31 | Disposition: A | Discharge status: Home / Self Care | Source: Ambulatory Visit | Attending: Radiation Oncology | Admitting: Radiation Oncology

## 2023-08-31 DIAGNOSIS — F17220 Nicotine dependence, chewing tobacco, uncomplicated: Secondary | ICD-10-CM | POA: Diagnosis not present

## 2023-08-31 DIAGNOSIS — C3432 Malignant neoplasm of lower lobe, left bronchus or lung: Secondary | ICD-10-CM | POA: Diagnosis not present

## 2023-08-31 DIAGNOSIS — E871 Hypo-osmolality and hyponatremia: Secondary | ICD-10-CM | POA: Diagnosis not present

## 2023-08-31 DIAGNOSIS — C3490 Malignant neoplasm of unspecified part of unspecified bronchus or lung: Secondary | ICD-10-CM | POA: Diagnosis not present

## 2023-08-31 DIAGNOSIS — Z510 Encounter for antineoplastic radiation therapy: Secondary | ICD-10-CM | POA: Diagnosis not present

## 2023-08-31 LAB — RAD ONC ARIA SESSION SUMMARY
Course Elapsed Days: 22
Plan Fractions Treated to Date: 16
Plan Prescribed Dose Per Fraction: 2 Gy
Plan Total Fractions Prescribed: 30
Plan Total Prescribed Dose: 60 Gy
Reference Point Dosage Given to Date: 32 Gy
Reference Point Session Dosage Given: 2 Gy
Session Number: 16

## 2023-08-31 LAB — CBC WITH DIFFERENTIAL (CANCER CENTER ONLY)
Abs Immature Granulocytes: 0.02 K/uL (ref 0.00–0.07)
Basophils Absolute: 0 K/uL (ref 0.0–0.1)
Basophils Relative: 0 %
Eosinophils Absolute: 0.1 K/uL (ref 0.0–0.5)
Eosinophils Relative: 2 %
HCT: 34.3 % — ABNORMAL LOW (ref 36.0–46.0)
Hemoglobin: 11.4 g/dL — ABNORMAL LOW (ref 12.0–15.0)
Immature Granulocytes: 0 %
Lymphocytes Relative: 8 %
Lymphs Abs: 0.4 K/uL — ABNORMAL LOW (ref 0.7–4.0)
MCH: 27.8 pg (ref 26.0–34.0)
MCHC: 33.2 g/dL (ref 30.0–36.0)
MCV: 83.7 fL (ref 80.0–100.0)
Monocytes Absolute: 0.4 K/uL (ref 0.1–1.0)
Monocytes Relative: 7 %
Neutro Abs: 4.3 K/uL (ref 1.7–7.7)
Neutrophils Relative %: 83 %
Platelet Count: 203 K/uL (ref 150–400)
RBC: 4.1 MIL/uL (ref 3.87–5.11)
RDW: 14 % (ref 11.5–15.5)
WBC Count: 5.2 K/uL (ref 4.0–10.5)
nRBC: 0 % (ref 0.0–0.2)

## 2023-08-31 LAB — CMP (CANCER CENTER ONLY)
ALT: 9 U/L (ref 0–44)
AST: 17 U/L (ref 15–41)
Albumin: 4.2 g/dL (ref 3.5–5.0)
Alkaline Phosphatase: 54 U/L (ref 38–126)
Anion gap: 7 (ref 5–15)
BUN: 16 mg/dL (ref 8–23)
CO2: 24 mmol/L (ref 22–32)
Calcium: 9.9 mg/dL (ref 8.9–10.3)
Chloride: 100 mmol/L (ref 98–111)
Creatinine: 1.1 mg/dL — ABNORMAL HIGH (ref 0.44–1.00)
GFR, Estimated: 50 mL/min — ABNORMAL LOW (ref >60)
Glucose, Bld: 102 mg/dL — ABNORMAL HIGH (ref 70–99)
Potassium: 4.1 mmol/L (ref 3.5–5.1)
Sodium: 131 mmol/L — ABNORMAL LOW (ref 135–145)
Total Bilirubin: 0.4 mg/dL (ref 0.0–1.2)
Total Protein: 8.7 g/dL — ABNORMAL HIGH (ref 6.5–8.1)

## 2023-08-31 NOTE — Telephone Encounter (Signed)
 Spoke to [facility] and they informed me the patient's endoscopy has been rescheduled to 10/12/2023 and she will still need cardiac clearance.

## 2023-08-31 NOTE — Telephone Encounter (Signed)
 Left a message for the patient to call us  back to get her scheduled for cardiac clearance since her procedure was moved to 10/12/23 now per the gastroenterology office.

## 2023-09-01 ENCOUNTER — Other Ambulatory Visit: Payer: Self-pay

## 2023-09-01 ENCOUNTER — Ambulatory Visit
Admission: RE | Admit: 2023-09-01 | Discharge: 2023-09-01 | Disposition: A | Discharge status: Home / Self Care | Source: Ambulatory Visit | Attending: Radiation Oncology | Admitting: Radiation Oncology

## 2023-09-01 ENCOUNTER — Inpatient Hospital Stay

## 2023-09-01 DIAGNOSIS — Z510 Encounter for antineoplastic radiation therapy: Secondary | ICD-10-CM | POA: Diagnosis not present

## 2023-09-01 DIAGNOSIS — F17220 Nicotine dependence, chewing tobacco, uncomplicated: Secondary | ICD-10-CM | POA: Diagnosis not present

## 2023-09-01 DIAGNOSIS — C3490 Malignant neoplasm of unspecified part of unspecified bronchus or lung: Secondary | ICD-10-CM | POA: Diagnosis not present

## 2023-09-01 DIAGNOSIS — E871 Hypo-osmolality and hyponatremia: Secondary | ICD-10-CM | POA: Diagnosis not present

## 2023-09-01 DIAGNOSIS — C3432 Malignant neoplasm of lower lobe, left bronchus or lung: Secondary | ICD-10-CM | POA: Diagnosis not present

## 2023-09-01 LAB — RAD ONC ARIA SESSION SUMMARY
Course Elapsed Days: 23
Plan Fractions Treated to Date: 17
Plan Prescribed Dose Per Fraction: 2 Gy
Plan Total Fractions Prescribed: 30
Plan Total Prescribed Dose: 60 Gy
Reference Point Dosage Given to Date: 34 Gy
Reference Point Session Dosage Given: 2 Gy
Session Number: 17

## 2023-09-01 NOTE — Telephone Encounter (Signed)
 2nd attempt to reach pt regarding surgical clearance and the need for an TELE appointment with the change in procedure date to 9/16.  Left pt a detailed message to call back and get that scheduled.

## 2023-09-02 ENCOUNTER — Inpatient Hospital Stay

## 2023-09-02 ENCOUNTER — Other Ambulatory Visit: Payer: Self-pay

## 2023-09-02 ENCOUNTER — Ambulatory Visit
Admission: RE | Admit: 2023-09-02 | Discharge: 2023-09-02 | Disposition: A | Discharge status: Home / Self Care | Source: Ambulatory Visit | Attending: Radiation Oncology | Admitting: Radiation Oncology

## 2023-09-02 DIAGNOSIS — Z510 Encounter for antineoplastic radiation therapy: Secondary | ICD-10-CM | POA: Diagnosis not present

## 2023-09-02 DIAGNOSIS — F17220 Nicotine dependence, chewing tobacco, uncomplicated: Secondary | ICD-10-CM | POA: Diagnosis not present

## 2023-09-02 DIAGNOSIS — C3432 Malignant neoplasm of lower lobe, left bronchus or lung: Secondary | ICD-10-CM | POA: Diagnosis not present

## 2023-09-02 DIAGNOSIS — E871 Hypo-osmolality and hyponatremia: Secondary | ICD-10-CM | POA: Diagnosis not present

## 2023-09-02 DIAGNOSIS — C3490 Malignant neoplasm of unspecified part of unspecified bronchus or lung: Secondary | ICD-10-CM | POA: Diagnosis not present

## 2023-09-02 LAB — RAD ONC ARIA SESSION SUMMARY
Course Elapsed Days: 24
Plan Fractions Treated to Date: 18
Plan Prescribed Dose Per Fraction: 2 Gy
Plan Total Fractions Prescribed: 30
Plan Total Prescribed Dose: 60 Gy
Reference Point Dosage Given to Date: 36 Gy
Reference Point Session Dosage Given: 2 Gy
Session Number: 18

## 2023-09-03 ENCOUNTER — Ambulatory Visit
Admission: RE | Admit: 2023-09-03 | Discharge: 2023-09-03 | Disposition: A | Discharge status: Home / Self Care | Source: Ambulatory Visit | Attending: Radiation Oncology | Admitting: Radiation Oncology

## 2023-09-03 ENCOUNTER — Encounter

## 2023-09-03 ENCOUNTER — Other Ambulatory Visit: Payer: Self-pay

## 2023-09-03 ENCOUNTER — Telehealth: Payer: Self-pay

## 2023-09-03 ENCOUNTER — Other Ambulatory Visit

## 2023-09-03 DIAGNOSIS — C3432 Malignant neoplasm of lower lobe, left bronchus or lung: Secondary | ICD-10-CM | POA: Diagnosis not present

## 2023-09-03 DIAGNOSIS — C3490 Malignant neoplasm of unspecified part of unspecified bronchus or lung: Secondary | ICD-10-CM | POA: Diagnosis not present

## 2023-09-03 DIAGNOSIS — E871 Hypo-osmolality and hyponatremia: Secondary | ICD-10-CM | POA: Diagnosis not present

## 2023-09-03 DIAGNOSIS — Z510 Encounter for antineoplastic radiation therapy: Secondary | ICD-10-CM | POA: Diagnosis not present

## 2023-09-03 DIAGNOSIS — F17220 Nicotine dependence, chewing tobacco, uncomplicated: Secondary | ICD-10-CM | POA: Diagnosis not present

## 2023-09-03 LAB — RAD ONC ARIA SESSION SUMMARY
Course Elapsed Days: 25
Plan Fractions Treated to Date: 19
Plan Prescribed Dose Per Fraction: 2 Gy
Plan Total Fractions Prescribed: 30
Plan Total Prescribed Dose: 60 Gy
Reference Point Dosage Given to Date: 38 Gy
Reference Point Session Dosage Given: 2 Gy
Session Number: 19

## 2023-09-03 NOTE — Telephone Encounter (Signed)
  Patient Consent for Virtual Visit         [name] has provided verbal consent on 09/03/2023 for a virtual visit (video or telephone).  Patient has been scheduled for Monday, September 13, 2023 @ 10am. Med and req and consent are complete.  Call daughter Sharrell phone [phone]  CONSENT FOR VIRTUAL VISIT FOR:  [name]  By participating in this virtual visit I agree to the following:  I hereby voluntarily request, consent and authorize [facility] and its employed or contracted physicians, physician assistants, nurse practitioners or other licensed health care professionals (the Practitioner), to provide me with telemedicine health care services (the "Services) as deemed necessary by the treating Practitioner. I acknowledge and consent to receive the Services by the Practitioner via telemedicine. I understand that the telemedicine visit will involve communicating with the Practitioner through live audiovisual communication technology and the disclosure of certain medical information by electronic transmission. I acknowledge that I have been given the opportunity to request an in-person assessment or other available alternative prior to the telemedicine visit and am voluntarily participating in the telemedicine visit.  I understand that I have the right to withhold or withdraw my consent to the use of telemedicine in the course of my care at any time, without affecting my right to future care or treatment, and that the Practitioner or I may terminate the telemedicine visit at any time. I understand that I have the right to inspect all information obtained and/or recorded in the course of the telemedicine visit and may receive copies of available information for a reasonable fee.  I understand that some of the potential risks of receiving the Services via telemedicine include:  Delay or interruption in medical evaluation due to technological equipment failure or  disruption; Information transmitted may not be sufficient (e.g. poor resolution of images) to allow for appropriate medical decision making by the Practitioner; and/or  In rare instances, security protocols could fail, causing a breach of personal health information.  Furthermore, I acknowledge that it is my responsibility to provide information about my medical history, conditions and care that is complete and accurate to the best of my ability. I acknowledge that Practitioner's advice, recommendations, and/or decision may be based on factors not within their control, such as incomplete or inaccurate data provided by me or distortions of diagnostic images or specimens that may result from electronic transmissions. I understand that the practice of medicine is not an exact science and that Practitioner makes no warranties or guarantees regarding treatment outcomes. I acknowledge that a copy of this consent can be made available to me via my patient portal [facility] MyChart), or I can request a printed copy by calling the office of [facility].    I understand that my insurance will be billed for this visit.   I have read or had this consent read to me. I understand the contents of this consent, which adequately explains the benefits and risks of the Services being provided via telemedicine.  I have been provided ample opportunity to ask questions regarding this consent and the Services and have had my questions answered to my satisfaction. I give my informed consent for the services to be provided through the use of telemedicine in my medical care

## 2023-09-03 NOTE — Telephone Encounter (Signed)
 Called and spoke with daughter [name]. She stated that patient's procedure has been moved to October 12, 2023 and she has scheduled tele appointment for 09/13/23 @ 10am

## 2023-09-06 ENCOUNTER — Ambulatory Visit
Admission: RE | Admit: 2023-09-06 | Discharge: 2023-09-06 | Disposition: A | Discharge status: Home / Self Care | Source: Ambulatory Visit | Attending: Radiation Oncology | Admitting: Radiation Oncology

## 2023-09-06 ENCOUNTER — Other Ambulatory Visit: Payer: Self-pay

## 2023-09-06 DIAGNOSIS — F17220 Nicotine dependence, chewing tobacco, uncomplicated: Secondary | ICD-10-CM | POA: Diagnosis not present

## 2023-09-06 DIAGNOSIS — Z510 Encounter for antineoplastic radiation therapy: Secondary | ICD-10-CM | POA: Diagnosis not present

## 2023-09-06 DIAGNOSIS — C3432 Malignant neoplasm of lower lobe, left bronchus or lung: Secondary | ICD-10-CM | POA: Diagnosis not present

## 2023-09-06 DIAGNOSIS — E871 Hypo-osmolality and hyponatremia: Secondary | ICD-10-CM | POA: Diagnosis not present

## 2023-09-06 DIAGNOSIS — C3490 Malignant neoplasm of unspecified part of unspecified bronchus or lung: Secondary | ICD-10-CM | POA: Diagnosis not present

## 2023-09-06 LAB — RAD ONC ARIA SESSION SUMMARY
Course Elapsed Days: 28
Plan Fractions Treated to Date: 20
Plan Prescribed Dose Per Fraction: 2 Gy
Plan Total Fractions Prescribed: 30
Plan Total Prescribed Dose: 60 Gy
Reference Point Dosage Given to Date: 40 Gy
Reference Point Session Dosage Given: 2 Gy
Session Number: 20

## 2023-09-07 ENCOUNTER — Ambulatory Visit
Admission: RE | Admit: 2023-09-07 | Discharge: 2023-09-07 | Disposition: A | Discharge status: Home / Self Care | Source: Ambulatory Visit | Attending: Radiation Oncology | Admitting: Radiation Oncology

## 2023-09-07 ENCOUNTER — Other Ambulatory Visit: Payer: Self-pay

## 2023-09-07 DIAGNOSIS — C3490 Malignant neoplasm of unspecified part of unspecified bronchus or lung: Secondary | ICD-10-CM | POA: Diagnosis not present

## 2023-09-07 DIAGNOSIS — F17220 Nicotine dependence, chewing tobacco, uncomplicated: Secondary | ICD-10-CM | POA: Diagnosis not present

## 2023-09-07 DIAGNOSIS — E871 Hypo-osmolality and hyponatremia: Secondary | ICD-10-CM | POA: Diagnosis not present

## 2023-09-07 DIAGNOSIS — C3432 Malignant neoplasm of lower lobe, left bronchus or lung: Secondary | ICD-10-CM | POA: Diagnosis not present

## 2023-09-07 DIAGNOSIS — Z510 Encounter for antineoplastic radiation therapy: Secondary | ICD-10-CM | POA: Diagnosis not present

## 2023-09-07 LAB — RAD ONC ARIA SESSION SUMMARY
Course Elapsed Days: 29
Plan Fractions Treated to Date: 21
Plan Prescribed Dose Per Fraction: 2 Gy
Plan Total Fractions Prescribed: 30
Plan Total Prescribed Dose: 60 Gy
Reference Point Dosage Given to Date: 42 Gy
Reference Point Session Dosage Given: 2 Gy
Session Number: 21

## 2023-09-08 ENCOUNTER — Ambulatory Visit
Admission: RE | Admit: 2023-09-08 | Discharge: 2023-09-08 | Disposition: A | Discharge status: Home / Self Care | Source: Ambulatory Visit | Attending: Radiation Oncology | Admitting: Radiation Oncology

## 2023-09-08 ENCOUNTER — Other Ambulatory Visit: Payer: Self-pay

## 2023-09-08 ENCOUNTER — Inpatient Hospital Stay: Admitting: Licensed Clinical Social Worker

## 2023-09-08 DIAGNOSIS — C3490 Malignant neoplasm of unspecified part of unspecified bronchus or lung: Secondary | ICD-10-CM

## 2023-09-08 DIAGNOSIS — F17220 Nicotine dependence, chewing tobacco, uncomplicated: Secondary | ICD-10-CM | POA: Diagnosis not present

## 2023-09-08 DIAGNOSIS — E871 Hypo-osmolality and hyponatremia: Secondary | ICD-10-CM | POA: Diagnosis not present

## 2023-09-08 DIAGNOSIS — Z510 Encounter for antineoplastic radiation therapy: Secondary | ICD-10-CM | POA: Diagnosis not present

## 2023-09-08 DIAGNOSIS — C3432 Malignant neoplasm of lower lobe, left bronchus or lung: Secondary | ICD-10-CM | POA: Diagnosis not present

## 2023-09-08 LAB — RAD ONC ARIA SESSION SUMMARY
Course Elapsed Days: 30
Plan Fractions Treated to Date: 22
Plan Prescribed Dose Per Fraction: 2 Gy
Plan Total Fractions Prescribed: 30
Plan Total Prescribed Dose: 60 Gy
Reference Point Dosage Given to Date: 44 Gy
Reference Point Session Dosage Given: 2 Gy
Session Number: 22

## 2023-09-08 NOTE — Progress Notes (Signed)
 [facility] Clinical Social Work  Clinical Social Work was referred by self for need for community resources.  Clinical Social Worker spoke with pt by phone and assessed for needs.    Pt reports she and her daughter are trying to move and need help paying off their light bill first. They do not have a disconnect notice. Pt does receive SNAP benefits.  Interventions: Provided patient with information about Crisis intervention program through DSS to potentially help with past-due amount  Referred pt to [place] for [employer] to assist with current bills      Follow Up Plan:  Patient will bring in documents for [employer] to [place] [place]. Pt/daughter will apply for CIP through DSS    Jaton Eilers E Sonji Starkes, LCSW  Clinical Social Worker [facility]

## 2023-09-09 ENCOUNTER — Ambulatory Visit
Admission: RE | Admit: 2023-09-09 | Discharge: 2023-09-09 | Disposition: A | Discharge status: Home / Self Care | Source: Ambulatory Visit | Attending: Radiation Oncology | Admitting: Radiation Oncology

## 2023-09-09 ENCOUNTER — Other Ambulatory Visit: Payer: Self-pay

## 2023-09-09 DIAGNOSIS — C3490 Malignant neoplasm of unspecified part of unspecified bronchus or lung: Secondary | ICD-10-CM | POA: Diagnosis not present

## 2023-09-09 DIAGNOSIS — F17220 Nicotine dependence, chewing tobacco, uncomplicated: Secondary | ICD-10-CM | POA: Diagnosis not present

## 2023-09-09 DIAGNOSIS — C3432 Malignant neoplasm of lower lobe, left bronchus or lung: Secondary | ICD-10-CM | POA: Diagnosis not present

## 2023-09-09 DIAGNOSIS — Z510 Encounter for antineoplastic radiation therapy: Secondary | ICD-10-CM | POA: Diagnosis not present

## 2023-09-09 DIAGNOSIS — E871 Hypo-osmolality and hyponatremia: Secondary | ICD-10-CM | POA: Diagnosis not present

## 2023-09-09 LAB — RAD ONC ARIA SESSION SUMMARY
Course Elapsed Days: 31
Plan Fractions Treated to Date: 23
Plan Prescribed Dose Per Fraction: 2 Gy
Plan Total Fractions Prescribed: 30
Plan Total Prescribed Dose: 60 Gy
Reference Point Dosage Given to Date: 46 Gy
Reference Point Session Dosage Given: 2 Gy
Session Number: 23

## 2023-09-10 ENCOUNTER — Ambulatory Visit

## 2023-09-13 ENCOUNTER — Other Ambulatory Visit: Payer: Self-pay

## 2023-09-13 ENCOUNTER — Telehealth: Payer: Self-pay | Admitting: Cardiology

## 2023-09-13 ENCOUNTER — Ambulatory Visit: Admission: RE | Admit: 2023-09-13 | Source: Ambulatory Visit

## 2023-09-13 ENCOUNTER — Emergency Department (HOSPITAL_COMMUNITY)

## 2023-09-13 ENCOUNTER — Ambulatory Visit

## 2023-09-13 ENCOUNTER — Ambulatory Visit: Attending: Cardiology | Admitting: Emergency Medicine

## 2023-09-13 ENCOUNTER — Encounter (HOSPITAL_COMMUNITY): Payer: Self-pay

## 2023-09-13 ENCOUNTER — Emergency Department (HOSPITAL_COMMUNITY): Admission: EM | Admit: 2023-09-13 | Discharge: 2023-09-13 | Disposition: A | Discharge status: Home / Self Care

## 2023-09-13 ENCOUNTER — Telehealth: Payer: Self-pay

## 2023-09-13 ENCOUNTER — Inpatient Hospital Stay

## 2023-09-13 ENCOUNTER — Telehealth: Payer: Self-pay | Admitting: Student in an Organized Health Care Education/Training Program

## 2023-09-13 ENCOUNTER — Telehealth: Payer: Self-pay | Admitting: Radiation Oncology

## 2023-09-13 DIAGNOSIS — R5383 Other fatigue: Secondary | ICD-10-CM | POA: Diagnosis not present

## 2023-09-13 DIAGNOSIS — Z01810 Encounter for preprocedural cardiovascular examination: Secondary | ICD-10-CM

## 2023-09-13 DIAGNOSIS — R197 Diarrhea, unspecified: Secondary | ICD-10-CM | POA: Diagnosis not present

## 2023-09-13 DIAGNOSIS — R519 Headache, unspecified: Secondary | ICD-10-CM | POA: Diagnosis not present

## 2023-09-13 DIAGNOSIS — Z7901 Long term (current) use of anticoagulants: Secondary | ICD-10-CM | POA: Diagnosis not present

## 2023-09-13 DIAGNOSIS — I700 Atherosclerosis of aorta: Secondary | ICD-10-CM | POA: Diagnosis not present

## 2023-09-13 DIAGNOSIS — E039 Hypothyroidism, unspecified: Secondary | ICD-10-CM | POA: Insufficient documentation

## 2023-09-13 DIAGNOSIS — Z743 Need for continuous supervision: Secondary | ICD-10-CM | POA: Diagnosis not present

## 2023-09-13 DIAGNOSIS — R918 Other nonspecific abnormal finding of lung field: Secondary | ICD-10-CM | POA: Diagnosis not present

## 2023-09-13 LAB — COMPREHENSIVE METABOLIC PANEL WITH GFR
ALT: 14 U/L (ref 0–44)
AST: 20 U/L (ref 15–41)
Albumin: 4 g/dL (ref 3.5–5.0)
Alkaline Phosphatase: 49 U/L (ref 38–126)
Anion gap: 8 (ref 5–15)
BUN: 21 mg/dL (ref 8–23)
CO2: 22 mmol/L (ref 22–32)
Calcium: 9.9 mg/dL (ref 8.9–10.3)
Chloride: 104 mmol/L (ref 98–111)
Creatinine, Ser: 1.09 mg/dL — ABNORMAL HIGH (ref 0.44–1.00)
GFR, Estimated: 51 mL/min — ABNORMAL LOW (ref >60)
Glucose, Bld: 117 mg/dL — ABNORMAL HIGH (ref 70–99)
Potassium: 3.9 mmol/L (ref 3.5–5.1)
Sodium: 134 mmol/L — ABNORMAL LOW (ref 135–145)
Total Bilirubin: 0.4 mg/dL (ref 0.0–1.2)
Total Protein: 8.4 g/dL — ABNORMAL HIGH (ref 6.5–8.1)

## 2023-09-13 LAB — CBC WITH DIFFERENTIAL/PLATELET
Abs Immature Granulocytes: 0.01 K/uL (ref 0.00–0.07)
Basophils Absolute: 0 K/uL (ref 0.0–0.1)
Basophils Relative: 1 %
Eosinophils Absolute: 0.1 K/uL (ref 0.0–0.5)
Eosinophils Relative: 3 %
HCT: 34.2 % — ABNORMAL LOW (ref 36.0–46.0)
Hemoglobin: 10.8 g/dL — ABNORMAL LOW (ref 12.0–15.0)
Immature Granulocytes: 0 %
Lymphocytes Relative: 6 %
Lymphs Abs: 0.3 K/uL — ABNORMAL LOW (ref 0.7–4.0)
MCH: 27.2 pg (ref 26.0–34.0)
MCHC: 31.6 g/dL (ref 30.0–36.0)
MCV: 86.1 fL (ref 80.0–100.0)
Monocytes Absolute: 0.4 K/uL (ref 0.1–1.0)
Monocytes Relative: 11 %
Neutro Abs: 3.3 K/uL (ref 1.7–7.7)
Neutrophils Relative %: 79 %
Platelets: 220 K/uL (ref 150–400)
RBC: 3.97 MIL/uL (ref 3.87–5.11)
RDW: 14.6 % (ref 11.5–15.5)
WBC: 4.1 K/uL (ref 4.0–10.5)
nRBC: 0 % (ref 0.0–0.2)

## 2023-09-13 LAB — TROPONIN I (HIGH SENSITIVITY)
Troponin I (High Sensitivity): 24 ng/L — ABNORMAL HIGH (ref <18)
Troponin I (High Sensitivity): 25 ng/L — ABNORMAL HIGH (ref <18)

## 2023-09-13 LAB — URINALYSIS, ROUTINE W REFLEX MICROSCOPIC
Bilirubin Urine: NEGATIVE
Glucose, UA: NEGATIVE mg/dL
Hgb urine dipstick: NEGATIVE
Ketones, ur: NEGATIVE mg/dL
Nitrite: POSITIVE — AB
Protein, ur: NEGATIVE mg/dL
Specific Gravity, Urine: 1.025 (ref 1.005–1.030)
pH: 6 (ref 5.0–8.0)

## 2023-09-13 LAB — TSH: TSH: 0.064 u[IU]/mL — ABNORMAL LOW (ref 0.350–4.500)

## 2023-09-13 LAB — LIPASE, BLOOD: Lipase: 32 U/L (ref 11–51)

## 2023-09-13 MED ORDER — SODIUM CHLORIDE 0.9 % IV BOLUS
500.0000 mL | Freq: Once | INTRAVENOUS | Status: AC
  Administered 2023-09-13: 500 mL via INTRAVENOUS

## 2023-09-13 MED ORDER — CEPHALEXIN 500 MG PO CAPS: 500.0000 mg | ORAL_CAPSULE | Freq: Two times a day (BID) | ORAL | 0 refills | Status: AC

## 2023-09-13 NOTE — Telephone Encounter (Signed)
 Spoke with Dr. Simon at [facility] about [name]. Evaluated for HA and fatigue. hsT mildly elevated but flat (24-26), prior 8-15. Besides UA+, mild chronic anemia and low TSH workup unremarkable.  ECG (09/13/23, 16:01:29): NSR 81, PR 186, QRS 80 ,QT/c 351/408, Q waves V2, 0.5 mm elevation V2 and 1 mm elevation V3. No reciprocal changes. Compared to multiple prior ECGs and often has elevation in V2, less so in V3.   Reviewed OP note from earlier today when patient reported some heartburn that was non exertional. She is currently undergoing radiation tx for lung cancer. She didn't present to the ED today however with those sx and has no anginal equivalents currently. Current workup unremarkable and otherwise asx, I don't think the elevation in V3 with flat hsT is related to presentation. Plan is to discharge home.

## 2023-09-13 NOTE — ED Notes (Signed)
 Call to lab to add Troponin to labs previously sent

## 2023-09-13 NOTE — Telephone Encounter (Signed)
 I s/w the pt and her daughter [name]. Both agreed to plan of care for in office appt per preop [facility] per pt C/o chest pain.   Appt with Jackee Alberts, NP 09/15/23 10:30. I will update all parties involved.

## 2023-09-13 NOTE — ED Triage Notes (Signed)
 Pt BIB EMS from home. Pt reports with headache and fatigue. Pt has been having radiation x 4 weeks. 1 episode of diarrhea today.

## 2023-09-13 NOTE — Discharge Instructions (Addendum)
 Please follow-up with your primary care physician regarding your TSH.  They may need to increase your Synthroid  dose.   You had 1 small abnormality on the EKG.  Therefore, want you  to follow-up with cardiology.  Please give them a call to see them in the office again.   If you have any, chest pain or shortness of breath then  please come back to the ED.

## 2023-09-13 NOTE — Telephone Encounter (Signed)
 Daughter requests call back to reschedule in-office Pre-op visit due to transportation issue.

## 2023-09-13 NOTE — Telephone Encounter (Signed)
 8/18 @ 11:46 am Patient's daughter called with concerns with pt's symptoms after boost treatment.  Secure chat sent to nursing, so they are aware.

## 2023-09-13 NOTE — Progress Notes (Addendum)
 Nutrition  Patient did not show up for scheduled nutrition appointment today.Noted patient in the ED.  Message sent to scheduling to offer another appointment  Maveric Debono B. Dasie SOLON, CSO, LDN Registered Dietitian [phone]

## 2023-09-13 NOTE — Telephone Encounter (Signed)
 I s/w the pt's daughter [name] and we rescheduled her mom to see Katlyn West, NP 09/29/23. I did tell pt's daughter that I will put the pt on a wait list. Pt's daughter stated transportation will need at least 3 business in advanced appt.

## 2023-09-13 NOTE — Telephone Encounter (Signed)
 [name] [facility]) called with concerns with symptoms patient is having after her boost treatment. She stated she is very tired/no energy and weakness, headache and heart burn. Please advise

## 2023-09-13 NOTE — Telephone Encounter (Signed)
 Tele visit completed today and patient noted episodes of chest pains and shortness of breath. She will require in-office visit for further evaluation.

## 2023-09-13 NOTE — Progress Notes (Signed)
 Virtual Visit via Telephone Note   Because of [name] co-morbid illnesses, she is at least at moderate risk for complications without adequate follow up.  This format is felt to be most appropriate for this patient at this time.  Due to technical limitations with video connection (technology), today's appointment will be conducted as an audio only telehealth visit, and [name] verbally agreed to proceed in this manner.   All issues noted in this document were discussed and addressed.  No physical exam could be performed with this format.  Evaluation Performed:  Preoperative cardiovascular risk assessment _____________   Date:  09/13/2023   Patient ID:  [name], DOB [birth year], MRN [id number] Patient Location:  Home Provider location:   Office  Primary Care Provider:  [name], MD Primary Cardiologist:  Gordy Bergamo, MD  Chief Complaint / Patient Profile   83 y.o. y/o female with a h/o typical atrial flutter, hypertension, mild carotid disease, coronary artery disease, hyperlipidemia, type 2 diabetes, history of Libman-Sacks endocarditis who is pending colonoscopy/endoscopy on 10/12/2023 with [facility] gastro[facility]ology and presents today for telephonic preoperative cardiovascular risk assessment.  History of Present Illness    [name] is a 83 y.o. female who presents via audio/video conferencing for a telehealth visit today.  Pt was last seen in cardiology clinic on 04/15/2023 by Dr. Cindie.  At that time SHEETAL LYALL was doing well.  The patient is now pending procedure as outlined above. Since her last visit, she reports episodes of chest pains described as heartburn.  She reports that these episodes of chest pains are nonexertional however have been fairly persistent over the past 2 weeks.  Of note she is having radiation therapy for her lung cancer currently.  She also notes ongoing shortness of breath that has been fairly persistent over the past several  months.  Past Medical History    Past Medical History:  Diagnosis Date   Acute bronchitis 12/17/2021   Acute cystitis 02/20/2019   Arthritis    Atrial flutter (HCC)    Breast cancer (HCC)    left  2009 and 2025   Diabetes mellitus without complication (HCC)    Dyspnea    mostly with exertion   GERD (gastroesophageal reflux disease)    Hypertension    Hypothyroidism    Libman Sacks endocarditis (HCC)    Lupus    Pneumonia 07/31/2021   Past Surgical History:  Procedure Laterality Date   BREAST BIOPSY Left 01/15/2023   US  LT BREAST BX W LOC DEV 1ST LESION IMG BX SPEC US  GUIDE 01/15/2023 [facility]   BREAST LUMPECTOMY Left 2009   BRONCHIAL BIOPSY  06/29/2023   Procedure: BRONCHOSCOPY, WITH BIOPSY;  Surgeon: [name], MD;  Location: [facility] ENDOSCOPY;  Service: Pulmonary;;   BRONCHIAL BRUSHINGS  06/29/2023   Procedure: BRONCHOSCOPY, WITH BRUSH BIOPSY;  Surgeon: [name], MD;  Location: [facility]OS[facility];  Service: Pulmonary;;   BRONCHIAL NEEDLE ASPIRATION BIOPSY  06/29/2023   Procedure: BRONCHOSCOPY, WITH NEEDLE ASPIRATION BIOPSY;  Surgeon: [name], MD;  Location: [facility] ENDOS[facility];  Service: Pulmonary;;   IR 3D INDEPENDENT WKST  05/29/2021   IR ANGIO INTRA EXTRACRAN SEL COM CAROTID INNOMINATE BILAT MOD SED  05/29/2021   IR ANGIO VERTEBRAL SEL VERTEBRAL BILAT MOD SED  05/29/2021   SIMPLE MASTECTOMY WITH AXILLARY SENTINEL NODE BIOPSY Left 03/31/2023   Procedure: LEFT MASTECTOMY;  Surgeon: Vernetta Berg, MD;  Location: [facility] OR;  Service: General;  Laterality: Left;  LMA PEC BLOCK   VIDEO BRONCHOSCOPY WITH ENDOBRONCHIAL NAVIGATION Left 06/29/2023   Procedure: VIDEO BRONCHOSCOPY WITH ENDOBRONCHIAL NAVIGATION;  Surgeon: [name], MD;  Location: [facility] ENDOSCOPY;  Service: Pulmonary;  Laterality: Left;   VIDEO BRONCHOSCOPY WITH ENDOBRONCHIAL ULTRASOUND Bilateral 06/29/2023   Procedure: BRONCHOSCOPY, WITH EBUS;  Surgeon: [name], MD;  Location: [facility] ENDOSCOPY;  Service:  Pulmonary;  Laterality: Bilateral;    Allergies  Allergies  Allergen Reactions   Ciprofloxacin Itching, Swelling and Other (See Comments)    Possibly causing tremors?   Crestor  [Rosuvastatin ] Other (See Comments)    Myalgia and back pain   Lisinopril  Cough   Ofev  [Nintedanib] Nausea Only    Home Medications    Prior to Admission medications   Medication Sig Start Date End Date Taking? Authorizing Provider  albuterol  (ACCUNEB ) 0.63 MG/3ML nebulizer solution Take 3 mLs (0.63 mg total) by nebulization every 6 (six) hours as needed for wheezing. 07/09/23   Ruthell Lauraine FALCON, NP  albuterol  (VENTOLIN  HFA) 108 (90 Base) MCG/ACT inhaler Inhale 2 puffs into the lungs every 6 (six) hours as needed for wheezing or shortness of breath. 07/09/23   Ruthell Lauraine FALCON, NP  anastrozole  (ARIMIDEX ) 1 MG tablet Take 1 tablet (1 mg total) by mouth daily. 04/08/23   [name], [name], MD  apixaban  (ELIQUIS ) 5 MG TABS tablet Take 1 tablet (5 mg total) by mouth 2 (two) times daily. Okay to restart on 06/30/2023 06/29/23   Byrum, Robert S, MD  diclofenac  Sodium (VOLTAREN ) 1 % GEL Apply 1 application. topically 4 (four) times daily as needed (pain).    [provider]  diltiazem  (CARDIZEM  CD) 120 MG 24 hr capsule Take 1 capsule (120 mg total) by mouth daily. 01/07/23   Dunn, Dayna N, PA-C  gabapentin  (NEURONTIN ) 300 MG capsule Take 300 mg by mouth 3 (three) times daily.    [provider]  glimepiride  (AMARYL ) 1 MG tablet Take 1 mg by mouth daily with breakfast.    [provider]  hydroxychloroquine  (PLAQUENIL ) 200 MG tablet Take 200 mg by mouth daily.    [provider]  levothyroxine  (SYNTHROID ) 100 MCG tablet Take 100 mcg by mouth daily. 02/05/21   [provider]  lidocaine  (XYLOCAINE ) 2 % solution Use as directed 15 mLs in the mouth or throat as needed. Swallow 15 min prior to meals 08/23/23   [name], MD  omeprazole (PRILOSEC) 20 MG capsule Take 20 mg by mouth daily as  needed (Indigestion). 12/25/22   [provider]  predniSONE  (DELTASONE ) 20 MG tablet Take 1 tablet (20 mg total) by mouth daily with breakfast. 06/21/23   Samtani, Jai-Gurmukh, MD  promethazine -dextromethorphan  (PROMETHAZINE -DM) 6.25-15 MG/5ML syrup Take 5 mLs by mouth 4 (four) times daily as needed for cough. 06/10/23   Reddick, Johnathan B, NP  spironolactone  (ALDACTONE ) 25 MG tablet Take 0.5 tablets (12.5 mg total) by mouth daily. 01/07/23   Dunn, Dayna N, PA-C  sucralfate  (CARAFATE ) 1 g tablet Take 1 tablet (1 g total) by mouth 4 (four) times daily -  with meals and at bedtime. Crush and dissolve in 10 mL's of warm water prior to swallowing, take 30 min prior to meals 08/23/23   [name], MD    Physical Exam    Vital Signs:  SHAQUANDRA GALANO does not have vital signs available for review today.\  Given telephonic nature of communication, physical exam is limited. AAOx3. NAD. Normal affect.  Speech and respirations are unlabored.  Accessory Clinical Findings  None  Assessment & Plan    1.  Preoperative Cardiovascular Risk Assessment:  Patient reports chest pains over the past 2 weeks described as heartburn that has been fairly persistent and nonexertional.  She also reports ongoing shortness of breath over the past several months.  She is currently receiving radiation therapy for history of lung cancer.  Patient will require an in office visit for further assessment.  ED precautions given  A copy of this note will be routed to requesting surgeon.  Time:   Today, I have spent 10 minutes with the patient with telehealth technology discussing medical history, symptoms, and management plan.     Lum LITTIE Louis, NP  09/13/2023, 7:48 AM

## 2023-09-13 NOTE — ED Provider Notes (Signed)
 [place] EMERGENCY DEPARTMENT AT [facility] Provider Note   CSN: [id number] Arrival date & time: 09/13/23  1531     Patient presents with: Headache   [name] is a 83 y.o. female.    Headache  Patient presents because of fatigue.  Patient states that she is also been experiencing headaches since her radiation started back in July.  Patient states that in terms of fatigue, has had very little oral intake of food or water over the past month or so.  She feels like she does not having much of an appetite.  Therefore, not taken in much p.o.  She is not having, nausea or vomiting.  Had 1 small episode of diarrhea this morning but otherwise no abdominal pain.  No chest pain.  Endorses chronic shortness of breath that is not beyond baseline over the past 6 months.  No pleuritic chest pain.  No hemoptysis.  Patient also endorsing a generalized headache.  Did not take a thing for the headache today.  Patient states it is in the middle of her head.  Been present ever since radiation started.  No vision changes.  No numbness or tingling aware.  No recent falls.  Patient states someone walking to her vehicle today to go to treatment, she became very fatigued and felt like she could not really get in the car.  Therefore, she came to the ED for further evaluation.    Previous medical history reviewed : Follows up with heme/onc. Malignant neoplasm of bronchus and lung. 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 Undergoing radiation for lung cancer. On anastrozole .    Prior to Admission medications   Medication Sig Start Date End Date Taking? Authorizing Provider  cephALEXin  (KEFLEX ) 500 MG capsule Take 1 capsule (500 mg total) by mouth 2 (two) times daily for 7 days. 09/13/23 09/20/23 Yes Simon Lavonia SAILOR, MD  albuterol  (ACCUNEB ) 0.63 MG/3ML nebulizer solution Take 3 mLs (0.63 mg total) by nebulization every 6 (six) hours as needed for wheezing. 07/09/23   Ruthell Lauraine FALCON,  NP  albuterol  (VENTOLIN  HFA) 108 (90 Base) MCG/ACT inhaler Inhale 2 puffs into the lungs every 6 (six) hours as needed for wheezing or shortness of breath. 07/09/23   Ruthell Lauraine FALCON, NP  anastrozole  (ARIMIDEX ) 1 MG tablet Take 1 tablet (1 mg total) by mouth daily. 04/08/23   [name], [name], MD  apixaban  (ELIQUIS ) 5 MG TABS tablet Take 1 tablet (5 mg total) by mouth 2 (two) times daily. Okay to restart on 06/30/2023 06/29/23   [name], MD  diclofenac  Sodium (VOLTAREN ) 1 % GEL Apply 1 application. topically 4 (four) times daily as needed (pain).    [provider]  diltiazem  (CARDIZEM  CD) 120 MG 24 hr capsule Take 1 capsule (120 mg total) by mouth daily. 01/07/23   Dunn, Dayna N, PA-C  gabapentin  (NEURONTIN ) 300 MG capsule Take 300 mg by mouth 3 (three) times daily.    [provider]  glimepiride  (AMARYL ) 1 MG tablet Take 1 mg by mouth daily with breakfast.    [provider]  hydroxychloroquine  (PLAQUENIL ) 200 MG tablet Take 200 mg by mouth daily.    [provider]  levothyroxine  (SYNTHROID ) 100 MCG tablet Take 100 mcg by mouth daily. 02/05/21   [provider]  lidocaine  (XYLOCAINE ) 2 % solution Use as directed 15 mLs in the mouth or throat as needed. Swallow 15 min prior to meals 08/23/23   [name], MD  omeprazole (PRILOSEC) 20 MG capsule Take 20 mg by mouth daily as needed (Indigestion). 12/25/22   [provider]  predniSONE  (DELTASONE ) 20 MG tablet Take 1 tablet (20 mg total) by mouth daily with breakfast. 06/21/23   Samtani, Jai-Gurmukh, MD  promethazine -dextromethorphan  (PROMETHAZINE -DM) 6.25-15 MG/5ML syrup Take 5 mLs by mouth 4 (four) times daily as needed for cough. 06/10/23   Reddick, Johnathan B, NP  spironolactone  (ALDACTONE ) 25 MG tablet Take 0.5 tablets (12.5 mg total) by mouth daily. 01/07/23   Dunn, Dayna N, PA-C  sucralfate  (CARAFATE ) 1 g tablet Take 1 tablet (1 g total) by mouth 4 (four) times daily -  with meals and at  bedtime. Crush and dissolve in 10 mL's of warm water prior to swallowing, take 30 min prior to meals 08/23/23   [name], MD    Allergies: Ciprofloxacin, Crestor  [rosuvastatin ], Lisinopril , and Ofev  [nintedanib]    Review of Systems  Neurological:  Positive for headaches.    Updated Vital Signs BP (!) 140/74   Pulse 71   Temp 98.4 F (36.9 C)   Resp 19   SpO2 99%   Physical Exam Vitals and nursing note reviewed.  Constitutional:      General: She is not in acute distress.    Appearance: She is well-developed.  HENT:     Head: Normocephalic and atraumatic.  Eyes:     Conjunctiva/sclera: Conjunctivae normal.  Cardiovascular:     Rate and Rhythm: Normal rate and regular rhythm.     Heart sounds: No murmur heard. Pulmonary:     Effort: Pulmonary effort is normal. No respiratory distress.     Breath sounds: Normal breath sounds.  Abdominal:     Palpations: Abdomen is soft.     Tenderness: There is no abdominal tenderness.  Musculoskeletal:        General: No swelling.     Cervical back: Neck supple.  Skin:    General: Skin is warm and dry.     Capillary Refill: Capillary refill takes less than 2 seconds.  Neurological:     Mental Status: She is alert.  Psychiatric:        Mood and Affect: Mood normal.     (all labs ordered are listed, but only abnormal results are displayed) Labs Reviewed  CBC WITH DIFFERENTIAL/PLATELET - Abnormal; Notable for the following components:      Result Value   Hemoglobin 10.8 (*)    HCT 34.2 (*)    Lymphs Abs 0.3 (*)    All other components within normal limits  COMPREHENSIVE METABOLIC PANEL WITH GFR - Abnormal; Notable for the following components:   Sodium 134 (*)    Glucose, Bld 117 (*)    Creatinine, Ser 1.09 (*)    Total Protein 8.4 (*)    GFR, Estimated 51 (*)    All other components within normal limits  URINALYSIS, ROUTINE W REFLEX MICROSCOPIC - Abnormal; Notable for the following components:   APPearance CLOUDY (*)     Nitrite POSITIVE (*)    Leukocytes,Ua SMALL (*)    Bacteria, UA MANY (*)    All other components within normal limits  TSH - Abnormal; Notable for the following components:   TSH 0.064 (*)    All other components within normal limits  TROPONIN I (HIGH SENSITIVITY) - Abnormal; Notable for the following components:   Troponin I (High Sensitivity) 24 (*)    All other components within normal limits  TROPONIN I (HIGH SENSITIVITY) - Abnormal; Notable for  the following components:   Troponin I (High Sensitivity) 25 (*)    All other components within normal limits  LIPASE, BLOOD    EKG: EKG Interpretation Date/Time:  Monday September 13 2023 16:01:29 EDT Ventricular Rate:  81 PR Interval:  186 QRS Duration:  80 QT Interval:  351 QTC Calculation: 408 R Axis:   -23  Text Interpretation: Sinus rhythm Probable LVH with secondary repol abnrm Anterior Q waves, possibly due to LVH Confirmed by Simon Rea [phone]) on 09/13/2023 5:25:59 PM  Radiology: CT Head Wo Contrast Result Date: 09/13/2023 EXAM: CT HEAD WITHOUT CONTRAST 09/13/2023 04:45:45 PM TECHNIQUE: CT of the head was performed without the administration of intravenous contrast. Automated exposure control, iterative reconstruction, and/or weight based adjustment of the mA/kV was utilized to reduce the radiation dose to as low as reasonably achievable. COMPARISON: MRI head 07/15/1923 CLINICAL HISTORY: Persistant headache. History of lung/breast cancer. Eval for any obvious mets. Headache and fatigue. Pt has been having radiation x 4 weeks. 1 episode of diarrhea today. FINDINGS: BRAIN AND VENTRICLES: No acute hemorrhage. Gray-white differentiation is preserved. No hydrocephalus. No extra-axial collection. No mass effect or midline shift. Nonspecific hypoattenuation in the periventricular and subcortical white matter, most likely representing chronic small vessel disease. Mild parenchymal volume loss. ORBITS: No acute abnormality. SINUSES:  Mucosal thickening of the ethmoid sinuses. Secretions in the left sphenoid sinus. SOFT TISSUES AND SKULL: No acute soft tissue abnormality. No skull fracture. VASCULATURE: Atherosclerosis of the carotid siphons and intracranial vertebral arteries. IMPRESSION: 1. No acute intracranial abnormality. 2. Chronic small vessel disease and mild parenchymal volume loss. Electronically signed by: Donnice Mania MD 09/13/2023 05:00 PM EDT RP Workstation: [id number]   DG Chest Portable 1 View Result Date: 09/13/2023 EXAM: 1 VIEW XRAY OF THE CHEST 09/13/2023 04:08:00 PM COMPARISON: 03/03/2023 CLINICAL HISTORY: Infectious workup. Per chart: Pt BIB EMS from home. Pt reports with headache and fatigue. Pt has been having radiation x 4 weeks. 1 episode of diarrhea today. FINDINGS: LUNGS AND PLEURA: Improved airspace opacities at the left lung base. Coarse interstitial opacities in both lung bases persist. No pleural effusion. No pneumothorax. HEART AND MEDIASTINUM: Atheromatous aorta. No acute abnormality of the cardiac and mediastinal silhouettes. BONES AND SOFT TISSUES: No acute osseous abnormality. IMPRESSION: 1. Improved airspace opacities at the left lung base. 2. Persistent coarse interstitial opacities in both lung bases. Electronically signed by: Dayne Hassell MD 09/13/2023 04:24 PM EDT RP Workstation: [id number]     Procedures   Medications Ordered in the ED  sodium chloride  0.9 % bolus 500 mL (0 mLs Intravenous Stopped 09/13/23 1756)                                    Medical Decision Making Amount and/or Complexity of Data Reviewed Labs: ordered. Radiology: ordered.  Risk Prescription drug management.   Previous medical history reviewed : Follows up with heme/onc. Malignant neoplasm of bronchus and lung. 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 Undergoing radiation for lung cancer. On anastrozole .   Patient presents because of fatigue.  Patient states that she is also been  experiencing headaches since her radiation started back in July.  Patient states that in terms of fatigue, has had very little oral intake of food or water over the past month or so.  She feels like she does not having much of an appetite.  Therefore, not taken in much p.o.  She  is not having, nausea or vomiting.  Had 1 small episode of diarrhea this morning but otherwise no abdominal pain.  No chest pain.  Endorses chronic shortness of breath that is not beyond baseline over the past 6 months.  No pleuritic chest pain.  No hemoptysis.  Patient also endorsing a generalized headache.  Did not take a thing for the headache today.  Patient states it is in the middle of her head.  Been present ever since radiation started.  No vision changes.  No numbness or tingling aware.  No recent falls.  Patient states someone walking to her vehicle today to go to treatment, she became very fatigued and felt like she could not really get in the car.  Therefore, she came to the ED for further evaluation.     Upon exam, patient alert and oriented x 3 GCS 15.  No focal deficits.  Cranials 2 through 12 intact.   Lung sounds clear to station bilaterally.  Soft and benign abdomen.  No concerns for any intra-abdominal pathology.   Patient's EKG.  Maybe some slight elevation in V3.  V2 slight elevation but is been seen multiple times to be 2.  Has been seen a couple times to be 3 as well.  No chest pain.  Did obtain troponin x 2.  No delta change.  Spoke to cardiology as well.  They viewed the EKG as well.  No indication for inpatient management this point time, follow-up outpatient which I think is reasonable.  No symptoms right now that is concerning.  Given headache, did obtain CT head.  Wanted to evaluate for  metastatic disease.  Unremarkable.  TSH is low.  Has a history of hypothyroidism.  Has been compliant with the levothyroxine .  Patient will follow-up with PCP regarding recheck of this value and possible increase of  her levothyroxine .  No bradycardia here.  No constipation.  This can be managed outpatient.   UA shows possible UTI.  Will start patient on Keflex  for UTI.  Chest x-ray shows some chronic changes in the lower lungs.  No concerns for infiltrate.  Likely related to radiation.  Patient received 530 cc of fluid.  Patient remained hemodynamically stable.  99% on room air.  Normal rate and rhythm.  Cardiac telemetry interpreted by me, normal sinus rhythm.  Discharge stable condition.       Final diagnoses:  Other fatigue  Nonintractable headache, unspecified chronicity pattern, unspecified headache type    ED Discharge Orders          Ordered    cephALEXin  (KEFLEX ) 500 MG capsule  2 times daily        09/13/23 2150               Simon Lavonia SAILOR, MD 09/13/23 2229

## 2023-09-14 ENCOUNTER — Ambulatory Visit
Admission: RE | Admit: 2023-09-14 | Discharge: 2023-09-14 | Disposition: A | Discharge status: Home / Self Care | Source: Ambulatory Visit | Attending: Radiation Oncology | Admitting: Radiation Oncology

## 2023-09-14 ENCOUNTER — Ambulatory Visit

## 2023-09-14 ENCOUNTER — Other Ambulatory Visit: Payer: Self-pay

## 2023-09-14 DIAGNOSIS — C3490 Malignant neoplasm of unspecified part of unspecified bronchus or lung: Secondary | ICD-10-CM | POA: Diagnosis not present

## 2023-09-14 DIAGNOSIS — F17220 Nicotine dependence, chewing tobacco, uncomplicated: Secondary | ICD-10-CM | POA: Diagnosis not present

## 2023-09-14 DIAGNOSIS — Z510 Encounter for antineoplastic radiation therapy: Secondary | ICD-10-CM | POA: Diagnosis not present

## 2023-09-14 DIAGNOSIS — C3432 Malignant neoplasm of lower lobe, left bronchus or lung: Secondary | ICD-10-CM | POA: Diagnosis not present

## 2023-09-14 DIAGNOSIS — E871 Hypo-osmolality and hyponatremia: Secondary | ICD-10-CM | POA: Diagnosis not present

## 2023-09-14 LAB — RAD ONC ARIA SESSION SUMMARY
Course Elapsed Days: 36
Plan Fractions Treated to Date: 24
Plan Prescribed Dose Per Fraction: 2 Gy
Plan Total Fractions Prescribed: 30
Plan Total Prescribed Dose: 60 Gy
Reference Point Dosage Given to Date: 48 Gy
Reference Point Session Dosage Given: 2 Gy
Session Number: 24

## 2023-09-15 ENCOUNTER — Other Ambulatory Visit: Payer: Self-pay

## 2023-09-15 ENCOUNTER — Ambulatory Visit
Admission: RE | Admit: 2023-09-15 | Discharge: 2023-09-15 | Discharge status: Home / Self Care | Source: Ambulatory Visit | Attending: Radiation Oncology | Admitting: Radiation Oncology

## 2023-09-15 ENCOUNTER — Ambulatory Visit: Admitting: Nurse Practitioner

## 2023-09-15 DIAGNOSIS — C3432 Malignant neoplasm of lower lobe, left bronchus or lung: Secondary | ICD-10-CM | POA: Diagnosis not present

## 2023-09-15 DIAGNOSIS — Z510 Encounter for antineoplastic radiation therapy: Secondary | ICD-10-CM | POA: Diagnosis not present

## 2023-09-15 DIAGNOSIS — C3490 Malignant neoplasm of unspecified part of unspecified bronchus or lung: Secondary | ICD-10-CM | POA: Diagnosis not present

## 2023-09-15 DIAGNOSIS — F17220 Nicotine dependence, chewing tobacco, uncomplicated: Secondary | ICD-10-CM | POA: Diagnosis not present

## 2023-09-15 DIAGNOSIS — E871 Hypo-osmolality and hyponatremia: Secondary | ICD-10-CM | POA: Diagnosis not present

## 2023-09-15 LAB — RAD ONC ARIA SESSION SUMMARY
Course Elapsed Days: 37
Plan Fractions Treated to Date: 25
Plan Prescribed Dose Per Fraction: 2 Gy
Plan Total Fractions Prescribed: 30
Plan Total Prescribed Dose: 60 Gy
Reference Point Dosage Given to Date: 50 Gy
Reference Point Session Dosage Given: 2 Gy
Session Number: 25

## 2023-09-16 ENCOUNTER — Ambulatory Visit

## 2023-09-16 ENCOUNTER — Ambulatory Visit
Admission: RE | Admit: 2023-09-16 | Discharge: 2023-09-16 | Disposition: A | Discharge status: Home / Self Care | Source: Ambulatory Visit | Attending: Radiation Oncology | Admitting: Radiation Oncology

## 2023-09-16 ENCOUNTER — Encounter: Admitting: Nutrition

## 2023-09-16 ENCOUNTER — Other Ambulatory Visit: Payer: Self-pay

## 2023-09-16 DIAGNOSIS — E871 Hypo-osmolality and hyponatremia: Secondary | ICD-10-CM | POA: Diagnosis not present

## 2023-09-16 DIAGNOSIS — C3432 Malignant neoplasm of lower lobe, left bronchus or lung: Secondary | ICD-10-CM | POA: Diagnosis not present

## 2023-09-16 DIAGNOSIS — F17220 Nicotine dependence, chewing tobacco, uncomplicated: Secondary | ICD-10-CM | POA: Diagnosis not present

## 2023-09-16 DIAGNOSIS — Z510 Encounter for antineoplastic radiation therapy: Secondary | ICD-10-CM | POA: Diagnosis not present

## 2023-09-16 DIAGNOSIS — C3490 Malignant neoplasm of unspecified part of unspecified bronchus or lung: Secondary | ICD-10-CM | POA: Diagnosis not present

## 2023-09-16 LAB — RAD ONC ARIA SESSION SUMMARY
Course Elapsed Days: 38
Plan Fractions Treated to Date: 26
Plan Prescribed Dose Per Fraction: 2 Gy
Plan Total Fractions Prescribed: 30
Plan Total Prescribed Dose: 60 Gy
Reference Point Dosage Given to Date: 52 Gy
Reference Point Session Dosage Given: 2 Gy
Session Number: 26

## 2023-09-17 ENCOUNTER — Ambulatory Visit
Admission: RE | Admit: 2023-09-17 | Discharge: 2023-09-17 | Disposition: A | Discharge status: Home / Self Care | Source: Ambulatory Visit | Attending: Radiation Oncology | Admitting: Radiation Oncology

## 2023-09-17 ENCOUNTER — Other Ambulatory Visit: Payer: Self-pay

## 2023-09-17 DIAGNOSIS — C3490 Malignant neoplasm of unspecified part of unspecified bronchus or lung: Secondary | ICD-10-CM | POA: Diagnosis not present

## 2023-09-17 DIAGNOSIS — F17220 Nicotine dependence, chewing tobacco, uncomplicated: Secondary | ICD-10-CM | POA: Diagnosis not present

## 2023-09-17 DIAGNOSIS — Z510 Encounter for antineoplastic radiation therapy: Secondary | ICD-10-CM | POA: Diagnosis not present

## 2023-09-17 DIAGNOSIS — C3432 Malignant neoplasm of lower lobe, left bronchus or lung: Secondary | ICD-10-CM | POA: Diagnosis not present

## 2023-09-17 DIAGNOSIS — E871 Hypo-osmolality and hyponatremia: Secondary | ICD-10-CM | POA: Diagnosis not present

## 2023-09-17 LAB — RAD ONC ARIA SESSION SUMMARY
Course Elapsed Days: 39
Plan Fractions Treated to Date: 27
Plan Prescribed Dose Per Fraction: 2 Gy
Plan Total Fractions Prescribed: 30
Plan Total Prescribed Dose: 60 Gy
Reference Point Dosage Given to Date: 54 Gy
Reference Point Session Dosage Given: 2 Gy
Session Number: 27

## 2023-09-20 ENCOUNTER — Inpatient Hospital Stay

## 2023-09-20 ENCOUNTER — Ambulatory Visit
Admission: RE | Admit: 2023-09-20 | Discharge: 2023-09-20 | Disposition: A | Discharge status: Home / Self Care | Source: Ambulatory Visit | Attending: Radiation Oncology | Admitting: Radiation Oncology

## 2023-09-20 ENCOUNTER — Other Ambulatory Visit: Payer: Self-pay

## 2023-09-20 ENCOUNTER — Ambulatory Visit

## 2023-09-20 DIAGNOSIS — C3490 Malignant neoplasm of unspecified part of unspecified bronchus or lung: Secondary | ICD-10-CM | POA: Diagnosis not present

## 2023-09-20 DIAGNOSIS — E871 Hypo-osmolality and hyponatremia: Secondary | ICD-10-CM | POA: Diagnosis not present

## 2023-09-20 DIAGNOSIS — C3432 Malignant neoplasm of lower lobe, left bronchus or lung: Secondary | ICD-10-CM | POA: Diagnosis not present

## 2023-09-20 DIAGNOSIS — F17220 Nicotine dependence, chewing tobacco, uncomplicated: Secondary | ICD-10-CM | POA: Diagnosis not present

## 2023-09-20 DIAGNOSIS — Z510 Encounter for antineoplastic radiation therapy: Secondary | ICD-10-CM | POA: Diagnosis not present

## 2023-09-20 LAB — RAD ONC ARIA SESSION SUMMARY
Course Elapsed Days: 42
Plan Fractions Treated to Date: 28
Plan Prescribed Dose Per Fraction: 2 Gy
Plan Total Fractions Prescribed: 30
Plan Total Prescribed Dose: 60 Gy
Reference Point Dosage Given to Date: 56 Gy
Reference Point Session Dosage Given: 2 Gy
Session Number: 28

## 2023-09-21 ENCOUNTER — Ambulatory Visit
Admission: RE | Admit: 2023-09-21 | Discharge: 2023-09-21 | Disposition: A | Discharge status: Home / Self Care | Source: Ambulatory Visit | Attending: Radiation Oncology | Admitting: Radiation Oncology

## 2023-09-21 ENCOUNTER — Inpatient Hospital Stay

## 2023-09-21 ENCOUNTER — Ambulatory Visit

## 2023-09-21 ENCOUNTER — Other Ambulatory Visit: Payer: Self-pay

## 2023-09-21 DIAGNOSIS — C3490 Malignant neoplasm of unspecified part of unspecified bronchus or lung: Secondary | ICD-10-CM | POA: Diagnosis not present

## 2023-09-21 DIAGNOSIS — E871 Hypo-osmolality and hyponatremia: Secondary | ICD-10-CM | POA: Diagnosis not present

## 2023-09-21 DIAGNOSIS — Z510 Encounter for antineoplastic radiation therapy: Secondary | ICD-10-CM | POA: Diagnosis not present

## 2023-09-21 DIAGNOSIS — F17220 Nicotine dependence, chewing tobacco, uncomplicated: Secondary | ICD-10-CM | POA: Diagnosis not present

## 2023-09-21 DIAGNOSIS — C3432 Malignant neoplasm of lower lobe, left bronchus or lung: Secondary | ICD-10-CM | POA: Diagnosis not present

## 2023-09-21 LAB — RAD ONC ARIA SESSION SUMMARY
Course Elapsed Days: 43
Plan Fractions Treated to Date: 29
Plan Prescribed Dose Per Fraction: 2 Gy
Plan Total Fractions Prescribed: 30
Plan Total Prescribed Dose: 60 Gy
Reference Point Dosage Given to Date: 58 Gy
Reference Point Session Dosage Given: 2 Gy
Session Number: 29

## 2023-09-22 ENCOUNTER — Ambulatory Visit

## 2023-09-22 ENCOUNTER — Inpatient Hospital Stay

## 2023-09-22 ENCOUNTER — Ambulatory Visit
Admission: RE | Admit: 2023-09-22 | Discharge: 2023-09-22 | Disposition: A | Discharge status: Home / Self Care | Source: Ambulatory Visit | Attending: Radiation Oncology | Admitting: Radiation Oncology

## 2023-09-22 ENCOUNTER — Other Ambulatory Visit: Payer: Self-pay

## 2023-09-22 DIAGNOSIS — E871 Hypo-osmolality and hyponatremia: Secondary | ICD-10-CM | POA: Diagnosis not present

## 2023-09-22 DIAGNOSIS — Z510 Encounter for antineoplastic radiation therapy: Secondary | ICD-10-CM | POA: Diagnosis not present

## 2023-09-22 DIAGNOSIS — C3432 Malignant neoplasm of lower lobe, left bronchus or lung: Secondary | ICD-10-CM | POA: Diagnosis not present

## 2023-09-22 DIAGNOSIS — F17220 Nicotine dependence, chewing tobacco, uncomplicated: Secondary | ICD-10-CM | POA: Diagnosis not present

## 2023-09-22 DIAGNOSIS — C3490 Malignant neoplasm of unspecified part of unspecified bronchus or lung: Secondary | ICD-10-CM | POA: Diagnosis not present

## 2023-09-22 LAB — RAD ONC ARIA SESSION SUMMARY
Course Elapsed Days: 44
Plan Fractions Treated to Date: 30
Plan Prescribed Dose Per Fraction: 2 Gy
Plan Total Fractions Prescribed: 30
Plan Total Prescribed Dose: 60 Gy
Reference Point Dosage Given to Date: 60 Gy
Reference Point Session Dosage Given: 2 Gy
Session Number: 30

## 2023-09-23 ENCOUNTER — Ambulatory Visit

## 2023-09-23 ENCOUNTER — Inpatient Hospital Stay

## 2023-09-23 NOTE — Progress Notes (Signed)
 Nutrition  Called patient for nutrition assessment.  No answer. Left message on voicemail requesting call back.    Albina Gosney B. Dasie SOLON, CSO, LDN Registered Dietitian [phone]

## 2023-09-24 ENCOUNTER — Ambulatory Visit

## 2023-09-24 ENCOUNTER — Inpatient Hospital Stay

## 2023-09-27 NOTE — Progress Notes (Deleted)
 Cardiology Office Note    Date:  09/27/2023  ID:  EMMALISE HUARD, DOB 1940-05-13, MRN [id number] PCP:  [name], MD  Cardiologist:  Gordy Bergamo, MD  Electrophysiologist:  OLE ONEIDA HOLTS, MD   Chief Complaint: ***  History of Present Illness: .    [name] is a 83 y.o. 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   Lexiscan  Myoview  stress test done in June 2018 showed LVEF 75%, normal perfusion.  Echocardiogram in January 2021 indicated Libman-Sacks endocarditis in setting of mitral valve anterior mitral leaflet tip vegetation in setting of lupus.  Patient was treated with anticoagulation.  Xarelto  was discontinued in 2022 through shared decision making due to echocardiogram revealing well-healed vegetation at the tip of the mitral valve.  Carotid artery duplex in January 2021 consistent with right and left ICA 1 to 39% stenosis.  HRCT of the chest in July 2021 showed lungs compatible with ILD, aortic atherosclerosis, left main and three-vessel coronary artery disease.  Echocardiogram in April 2024 with LVEF 60 to 65%, no RWMA, grade 1 DD, RV SF normal, severe calcification of anterior mitral valve leaflet, trivial MR.  Patient was admitted 09/2022, diagnosed with sepsis due to severe multifocal pneumonia.  Patient was experiencing severe shortness of breath and EKG confirmed new onset atrial flutter with rapid ventricular response.  She was started on IV diltiazem  and later transition to 60 mg diltiazem  twice daily for rate control.  Patient was also started on Eliquis .  Echocardiogram on 10/12/2022 indicated LVEF 60 to 65%, no RWMA, mild LVH, grade 2 DD, RV SF normal, left and right atrial size mildly dilated, mild MR, trivial AR, aortic valve sclerosis.  Patient  was seen in clinic on 01/07/2023 by Lum Louis, NP.  She was doing well at time however had been off of Eliquis  and diltiazem  for a month due to miscommunication, she was restarted on diltiazem  and Eliquis  at that time.  Patient had a preoperative telephone call on 09/13/2023, patient reported that she had been having intermittent episode chest pain described as heartburn, reported that these episodes were nonexertional however been fairly persistent over the prior 2 weeks.  On chart review following her preoperative call patient presented to the emergency department related to episodes of fatigue and increased headaches since starting her radiation back in July.  Patient reported that she had very little oral intake of food or water over the prior month.  Patient denied chest pain, endorsed chronic shortness of breath that was not beyond her baseline over the past 6 months.  It was noted that patient's TSH was slightly low, her high sensitive troponin was 24 then 25, case reviewed by Dr. Almetta while inpatient, noted that besides UA positive, mild chronic anemia and low TSH workup was overall unremarkable.  Patient was discharged home in stable condition.  Today she presents regarding   Preoperative cardiac evaluation:  Lung cancer: Atrial flutter/chronic anticoagulation: Patient admitted in 09/2022, found to be in new onset atrial flutter with RVR in setting of severe sepsis, also noted during an unrelated admission in 04/2022 to have an episode of atrial flutter. EKG today Hypertension: Coronary artery disease: HRCT of the chest in 07/2019 showed aortic atherosclerosis, also left main and three-vessel coronary artery disease. Today she reports Type II DM: Last hemoglobin A1c  History of Libman-Sacks endocarditis: Felt to be resolved.  Labwork independently reviewed:   ROS: .   *** denies chest pain, shortness of breath, lower extremity edema, fatigue, palpitations, melena, hematuria,  hemoptysis, diaphoresis, weakness, presyncope, syncope, orthopnea, and PND.  All other systems are reviewed and otherwise negative.  Studies Reviewed: SABRA    EKG:  EKG is ordered today, personally reviewed, demonstrating ***     CV Studies: Cardiac studies reviewed are outlined and summarized above. Otherwise please see EMR for full report. Cardiac Studies & Procedures   ______________________________________________________________________________________________     ECHOCARDIOGRAM  ECHOCARDIOGRAM COMPLETE 06/19/2023  Narrative ECHOCARDIOGRAM REPORT    Patient Name:   [name] Date of Exam: 06/19/2023 Medical Rec #:  [id number]      Height:       67.0 in Accession #:    [id number]     Weight:       142.0 lb Date of Birth:  10-07-1940       BSA:          1.748 m Patient Age:    83 years       BP:           140/69 mmHg Patient Gender: F              HR:           78 bpm. Exam Location:  Inpatient  Procedure: 2D Echo, Color Doppler, Cardiac Doppler and Intracardiac Opacification Agent (Both Spectral and Color Flow Doppler were utilized during procedure).  Indications:    Pulmonary Embolus  History:        Patient has prior history of Echocardiogram examinations, most recent 10/12/2022. CHF, Arrythmias:Atrial Flutter; Risk Factors:Hypertension, Diabetes, Dyslipidemia and Hypothyroidism. GERD, Lupus.  Sonographer:    Logan Shove RDCS Referring Phys: [phone] VELNA SAUNDERS PAHWANI  IMPRESSIONS   1. Left ventricular ejection fraction, by estimation, is 60 to 65%. The left ventricle has normal function. The left ventricle has no regional wall motion abnormalities. There is moderate asymmetric left ventricular hypertrophy of the basal-septal segment. Left ventricular diastolic parameters are consistent with Grade I diastolic dysfunction (impaired relaxation). 2. Right ventricular systolic function is normal. The right ventricular size is normal. There is mildly elevated pulmonary  artery systolic pressure. The estimated right ventricular systolic pressure is 37.6 mmHg. 3. The mitral valve is degenerative. Thickened leaflets. Trivial mitral valve regurgitation. 4. The aortic valve is tricuspid. Aortic valve regurgitation is not visualized. Aortic valve sclerosis is present, with no evidence of aortic valve stenosis. 5. The inferior vena cava is normal in size with greater than 50% respiratory variability, suggesting right atrial pressure of 3 mmHg.  FINDINGS Left Ventricle: Left ventricular ejection fraction, by estimation, is 60 to 65%. The left ventricle has normal function. The left ventricle has no regional wall motion abnormalities. The left ventricular internal cavity size was normal in size. There is moderate asymmetric left ventricular hypertrophy of the basal-septal segment. Left ventricular diastolic parameters are consistent with Grade I diastolic dysfunction (impaired relaxation).  Right Ventricle: The right ventricular size is normal. No increase in right ventricular wall thickness. Right ventricular systolic function is normal. There is mildly elevated pulmonary artery systolic pressure. The tricuspid regurgitant velocity is 2.94 m/s, and with an assumed right atrial pressure of 3 mmHg, the estimated right ventricular systolic pressure is 37.6 mmHg.  Left Atrium: Left atrial size was normal in size.  Right Atrium: Right atrial size was normal in size.  Pericardium: There  is no evidence of pericardial effusion.  Mitral Valve: The mitral valve is degenerative in appearance. Trivial mitral valve regurgitation.  Tricuspid Valve: The tricuspid valve is normal in structure. Tricuspid valve regurgitation is mild.  Aortic Valve: The aortic valve is tricuspid. Aortic valve regurgitation is not visualized. Aortic valve sclerosis is present, with no evidence of aortic valve stenosis. Aortic valve mean gradient measures 3.0 mmHg. Aortic valve peak gradient measures  5.5 mmHg. Aortic valve area, by VTI measures 2.84 cm.  Pulmonic Valve: The pulmonic valve was grossly normal. Pulmonic valve regurgitation is mild.  Aorta: The aortic root and ascending aorta are structurally normal, with no evidence of dilitation.  Venous: The inferior vena cava is normal in size with greater than 50% respiratory variability, suggesting right atrial pressure of 3 mmHg.  IAS/Shunts: The interatrial septum was not well visualized.   LEFT VENTRICLE PLAX 2D LVIDd:         3.80 cm      Diastology LVIDs:         2.00 cm      LV e' medial:    6.42 cm/s LV PW:         0.90 cm      LV E/e' medial:  12.1 LV IVS:        1.10 cm      LV e' lateral:   8.16 cm/s LVOT diam:     2.00 cm      LV E/e' lateral: 9.5 LV SV:         71 LV SV Index:   40 LVOT Area:     3.14 cm  LV Volumes (MOD) LV vol d, MOD A2C: 79.4 ml LV vol d, MOD A4C: 110.0 ml LV vol s, MOD A2C: 25.9 ml LV vol s, MOD A4C: 36.6 ml LV SV MOD A2C:     53.5 ml LV SV MOD A4C:     110.0 ml LV SV MOD BP:      64.0 ml  RIGHT VENTRICLE             IVC RV Basal diam:  3.20 cm     IVC diam: 1.20 cm RV S prime:     16.40 cm/s TAPSE (M-mode): 2.1 cm  LEFT ATRIUM             Index        RIGHT ATRIUM           Index LA diam:        3.00 cm 1.72 cm/m   RA Area:     12.00 cm LA Vol (A2C):   46.7 ml 26.71 ml/m  RA Volume:   21.60 ml  12.35 ml/m LA Vol (A4C):   28.6 ml 16.36 ml/m LA Biplane Vol: 37.2 ml 21.28 ml/m AORTIC VALVE AV Area (Vmax):    2.60 cm AV Area (Vmean):   2.81 cm AV Area (VTI):     2.84 cm AV Vmax:           117.00 cm/s AV Vmean:          79.600 cm/s AV VTI:            0.249 m AV Peak Grad:      5.5 mmHg AV Mean Grad:      3.0 mmHg LVOT Vmax:         96.90 cm/s LVOT Vmean:        71.300 cm/s LVOT VTI:  0.225 m LVOT/AV VTI ratio: 0.90  AORTA Ao Root diam: 3.60 cm Ao Asc diam:  3.10 cm  MITRAL VALVE                TRICUSPID VALVE MV Area (PHT): 3.08 cm     TR Peak grad:    34.6 mmHg MV Decel Time: 246 msec     TR Vmax:        294.00 cm/s MV E velocity: 77.60 cm/s MV A velocity: 133.00 cm/s  SHUNTS MV E/A ratio:  0.58         Systemic VTI:  0.22 m Systemic Diam: 2.00 cm  Lonni Nanas MD Electronically signed by Lonni Nanas MD Signature Date/Time: 06/19/2023/1:17:37 PM    Final    MONITORS  LONG TERM MONITOR (3-14 DAYS) 07/31/2021  Narrative Zio patch cardiac monitor 11 days (07/14/2021 - 07/25/2021): Predominant underlying rhythm was sinus.  8 episodes of SVT with the longest lasting 19 beats.  Single nonsustained asymptomatic 4 beat episode of ventricular tachycardia at 7 AM on 07/20/2021.  Rare PACs and rare PVCs.  Minimum heart rate 48 bpm, maximum heart rate 220 bpm, average heart rate 73 bpm.  No evidence of high degree AV block, pauses >3 seconds, or atrial fibrillation.  No patient triggered events.       ______________________________________________________________________________________________       Current Reported Medications:.    No outpatient medications have been marked as taking for the 09/29/23 encounter (Appointment) with Shantal Roan D, NP.    Physical Exam:    VS:  There were no vitals taken for this visit.   Wt Readings from Last 3 Encounters:  08/19/23 133 lb 3.2 oz (60.4 kg)  07/27/23 141 lb 14.4 oz (64.4 kg)  07/22/23 140 lb 12.8 oz (63.9 kg)    GEN: Well nourished, well developed in no acute distress NECK: No JVD; No carotid bruits CARDIAC: ***RRR, no murmurs, rubs, gallops RESPIRATORY:  Clear to auscultation without rales, wheezing or rhonchi  ABDOMEN: Soft, non-tender, non-distended EXTREMITIES:  No edema; No acute deformity     Asessement and Plan:.     ***     Disposition: F/u with ***  Signed, Jeanette Moffatt D Sederick Jacobsen, NP

## 2023-09-28 ENCOUNTER — Encounter (HOSPITAL_COMMUNITY): Payer: Self-pay

## 2023-09-28 ENCOUNTER — Other Ambulatory Visit: Payer: Self-pay

## 2023-09-28 ENCOUNTER — Ambulatory Visit

## 2023-09-28 ENCOUNTER — Emergency Department (HOSPITAL_COMMUNITY)

## 2023-09-28 ENCOUNTER — Telehealth: Payer: Self-pay | Admitting: Radiation Oncology

## 2023-09-28 ENCOUNTER — Inpatient Hospital Stay

## 2023-09-28 ENCOUNTER — Emergency Department (HOSPITAL_COMMUNITY)
Admission: EM | Admit: 2023-09-28 | Discharge: 2023-09-28 | Disposition: A | Discharge status: Home / Self Care | Attending: Emergency Medicine | Admitting: Emergency Medicine

## 2023-09-28 ENCOUNTER — Telehealth: Payer: Self-pay

## 2023-09-28 DIAGNOSIS — Z7901 Long term (current) use of anticoagulants: Secondary | ICD-10-CM | POA: Diagnosis not present

## 2023-09-28 DIAGNOSIS — R1310 Dysphagia, unspecified: Secondary | ICD-10-CM | POA: Insufficient documentation

## 2023-09-28 DIAGNOSIS — Z79899 Other long term (current) drug therapy: Secondary | ICD-10-CM | POA: Diagnosis not present

## 2023-09-28 DIAGNOSIS — C3490 Malignant neoplasm of unspecified part of unspecified bronchus or lung: Secondary | ICD-10-CM | POA: Insufficient documentation

## 2023-09-28 DIAGNOSIS — Z7984 Long term (current) use of oral hypoglycemic drugs: Secondary | ICD-10-CM | POA: Insufficient documentation

## 2023-09-28 DIAGNOSIS — E119 Type 2 diabetes mellitus without complications: Secondary | ICD-10-CM | POA: Insufficient documentation

## 2023-09-28 DIAGNOSIS — Z85118 Personal history of other malignant neoplasm of bronchus and lung: Secondary | ICD-10-CM | POA: Diagnosis not present

## 2023-09-28 DIAGNOSIS — I509 Heart failure, unspecified: Secondary | ICD-10-CM | POA: Diagnosis not present

## 2023-09-28 DIAGNOSIS — Z743 Need for continuous supervision: Secondary | ICD-10-CM | POA: Diagnosis not present

## 2023-09-28 DIAGNOSIS — R109 Unspecified abdominal pain: Secondary | ICD-10-CM | POA: Diagnosis not present

## 2023-09-28 DIAGNOSIS — C50512 Malignant neoplasm of lower-outer quadrant of left female breast: Secondary | ICD-10-CM | POA: Insufficient documentation

## 2023-09-28 DIAGNOSIS — K2210 Ulcer of esophagus without bleeding: Secondary | ICD-10-CM | POA: Diagnosis not present

## 2023-09-28 DIAGNOSIS — R911 Solitary pulmonary nodule: Secondary | ICD-10-CM | POA: Diagnosis not present

## 2023-09-28 LAB — CBC
HCT: 36.8 % (ref 36.0–46.0)
Hemoglobin: 11.5 g/dL — ABNORMAL LOW (ref 12.0–15.0)
MCH: 27.5 pg (ref 26.0–34.0)
MCHC: 31.3 g/dL (ref 30.0–36.0)
MCV: 88 fL (ref 80.0–100.0)
Platelets: 260 K/uL (ref 150–400)
RBC: 4.18 MIL/uL (ref 3.87–5.11)
RDW: 15.6 % — ABNORMAL HIGH (ref 11.5–15.5)
WBC: 4.3 K/uL (ref 4.0–10.5)
nRBC: 0 % (ref 0.0–0.2)

## 2023-09-28 LAB — COMPREHENSIVE METABOLIC PANEL WITH GFR
ALT: 13 U/L (ref 0–44)
AST: 26 U/L (ref 15–41)
Albumin: 4.4 g/dL (ref 3.5–5.0)
Alkaline Phosphatase: 58 U/L (ref 38–126)
Anion gap: 12 (ref 5–15)
BUN: 22 mg/dL (ref 8–23)
CO2: 24 mmol/L (ref 22–32)
Calcium: 10.5 mg/dL — ABNORMAL HIGH (ref 8.9–10.3)
Chloride: 103 mmol/L (ref 98–111)
Creatinine, Ser: 0.97 mg/dL (ref 0.44–1.00)
GFR, Estimated: 59 mL/min — ABNORMAL LOW (ref >60)
Glucose, Bld: 87 mg/dL (ref 70–99)
Potassium: 4 mmol/L (ref 3.5–5.1)
Sodium: 138 mmol/L (ref 135–145)
Total Bilirubin: 0.6 mg/dL (ref 0.0–1.2)
Total Protein: 9.1 g/dL — ABNORMAL HIGH (ref 6.5–8.1)

## 2023-09-28 LAB — LIPASE, BLOOD: Lipase: 25 U/L (ref 11–51)

## 2023-09-28 MED ORDER — SODIUM CHLORIDE 0.9 % IV BOLUS
500.0000 mL | Freq: Once | INTRAVENOUS | Status: AC
  Administered 2023-09-28: 500 mL via INTRAVENOUS

## 2023-09-28 MED ORDER — NYSTATIN 100000 UNIT/ML MT SUSP: 500000.0000 [IU] | Freq: Four times a day (QID) | OROMUCOSAL | 0 refills | Status: DC

## 2023-09-28 MED ORDER — PANTOPRAZOLE SODIUM 20 MG PO TBEC: 20.0000 mg | DELAYED_RELEASE_TABLET | Freq: Every day | ORAL | 0 refills | Status: DC

## 2023-09-28 MED ORDER — PANTOPRAZOLE SODIUM 40 MG IV SOLR
40.0000 mg | Freq: Once | INTRAVENOUS | Status: AC
  Administered 2023-09-28: 40 mg via INTRAVENOUS
  Filled 2023-09-28: qty 10

## 2023-09-28 MED ORDER — LIDOCAINE VISCOUS HCL 2 % MT SOLN
15.0000 mL | OROMUCOSAL | 0 refills | Status: DC | PRN
Start: 2023-09-28 — End: 2023-10-29

## 2023-09-28 MED ORDER — LIDOCAINE VISCOUS HCL 2 % MT SOLN
15.0000 mL | Freq: Once | OROMUCOSAL | Status: AC
  Administered 2023-09-28: 15 mL via OROMUCOSAL
  Filled 2023-09-28: qty 15

## 2023-09-28 NOTE — ED Provider Notes (Signed)
 [place] EMERGENCY DEPARTMENT AT [facility] Provider Note   CSN: [id number] Arrival date & time: 09/28/23  1336     Patient presents with: Abdominal Pain   [name] is a 83 y.o. female.   83 year old female presenting with pain/difficulty swallowing.  Patient tells me that symptoms been going on for months, but have been worse since last Wednesday after she completed her radiation for her lung cancer.  She endorses pain with swallowing that radiates to her right chest, she has been able to eat very little since Sunday secondary to this pain.  She is not in pain at rest, only when attempting to swallow food/liquid.  Denies fever, abdominal pain, nausea/vomiting, diarrhea.    Abdominal Pain      Prior to Admission medications   Medication Sig Start Date End Date Taking? Authorizing Provider  albuterol  (ACCUNEB ) 0.63 MG/3ML nebulizer solution Take 3 mLs (0.63 mg total) by nebulization every 6 (six) hours as needed for wheezing. 07/09/23   Ruthell Lauraine FALCON, NP  albuterol  (VENTOLIN  HFA) 108 (90 Base) MCG/ACT inhaler Inhale 2 puffs into the lungs every 6 (six) hours as needed for wheezing or shortness of breath. 07/09/23   Ruthell Lauraine FALCON, NP  anastrozole  (ARIMIDEX ) 1 MG tablet Take 1 tablet (1 mg total) by mouth daily. 04/08/23   [name], [name], MD  apixaban  (ELIQUIS ) 5 MG TABS tablet Take 1 tablet (5 mg total) by mouth 2 (two) times daily. Okay to restart on 06/30/2023 06/29/23   [name], MD  diclofenac  Sodium (VOLTAREN ) 1 % GEL Apply 1 application. topically 4 (four) times daily as needed (pain).    [provider]  diltiazem  (CARDIZEM  CD) 120 MG 24 hr capsule Take 1 capsule (120 mg total) by mouth daily. 01/07/23   Dunn, Dayna N, PA-C  gabapentin  (NEURONTIN ) 300 MG capsule Take 300 mg by mouth 3 (three) times daily.    [provider]  glimepiride  (AMARYL ) 1 MG tablet Take 1 mg by mouth daily with breakfast.    [provider]   hydroxychloroquine  (PLAQUENIL ) 200 MG tablet Take 200 mg by mouth daily.    [provider]  levothyroxine  (SYNTHROID ) 100 MCG tablet Take 100 mcg by mouth daily. 02/05/21   [provider]  lidocaine  (XYLOCAINE ) 2 % solution Use as directed 15 mLs in the mouth or throat as needed. Swallow 15 min prior to meals 08/23/23   [name], MD  omeprazole (PRILOSEC) 20 MG capsule Take 20 mg by mouth daily as needed (Indigestion). 12/25/22   [provider]  predniSONE  (DELTASONE ) 20 MG tablet Take 1 tablet (20 mg total) by mouth daily with breakfast. 06/21/23   Samtani, Jai-Gurmukh, MD  promethazine -dextromethorphan  (PROMETHAZINE -DM) 6.25-15 MG/5ML syrup Take 5 mLs by mouth 4 (four) times daily as needed for cough. 06/10/23   Reddick, Johnathan B, NP  spironolactone  (ALDACTONE ) 25 MG tablet Take 0.5 tablets (12.5 mg total) by mouth daily. 01/07/23   Dunn, Dayna N, PA-C  sucralfate  (CARAFATE ) 1 g tablet Take 1 tablet (1 g total) by mouth 4 (four) times daily -  with meals and at bedtime. Crush and dissolve in 10 mL's of warm water prior to swallowing, take 30 min prior to meals 08/23/23   [name], MD    Allergies: Ciprofloxacin, Crestor  [rosuvastatin ], Lisinopril , and Ofev  [nintedanib]    Review of Systems  HENT:  Positive for trouble swallowing.     Updated Vital Signs  Vitals:   09/28/23 1341 09/28/23 1343  09/28/23 1630 09/28/23 1930  BP:  127/73 132/69 (!) 141/64  Pulse:  77 73 72  Resp:  19 19 19   Temp:  98 F (36.7 C) 98 F (36.7 C)   TempSrc:  Oral Oral   SpO2:  100% 100% 100%  Weight: 54.4 kg     Height: 5' 7 (1.702 m)        Physical Exam Vitals and nursing note reviewed.  HENT:     Head: Normocephalic.     Comments: Oropharynx without erythema or exudates    Mouth/Throat:     Mouth: Mucous membranes are moist.     Pharynx: Oropharynx is clear.  Eyes:     Extraocular Movements: Extraocular movements intact.  Cardiovascular:     Rate and  Rhythm: Normal rate.  Pulmonary:     Effort: Pulmonary effort is normal.  Abdominal:     Palpations: Abdomen is soft.     Tenderness: There is no abdominal tenderness. There is no guarding.  Musculoskeletal:     Cervical back: Normal range of motion.     Comments: Moves all extremities spontaneously without difficulty  Skin:    General: Skin is warm and dry.  Neurological:     Mental Status: She is alert and oriented to person, place, and time.     (all labs ordered are listed, but only abnormal results are displayed) Labs Reviewed  COMPREHENSIVE METABOLIC PANEL WITH GFR - Abnormal; Notable for the following components:      Result Value   Calcium  10.5 (*)    Total Protein 9.1 (*)    GFR, Estimated 59 (*)    All other components within normal limits  CBC - Abnormal; Notable for the following components:   Hemoglobin 11.5 (*)    RDW 15.6 (*)    All other components within normal limits  LIPASE, BLOOD  URINALYSIS, ROUTINE W REFLEX MICROSCOPIC    EKG: None  Radiology: DG Chest Portable 1 View Result Date: 09/28/2023 CLINICAL DATA:  Difficulty swallowing EXAM: PORTABLE CHEST 1 VIEW COMPARISON:  None Available. FINDINGS: Normal cardiac silhouette. pulmonary nodule at the LEFT lung base measuring 2 cm is decreased in size from 4 cm mass on comparison PET-CT scan. No effusion, infiltrate or pneumothorax. IMPRESSION: 1. No acute cardiopulmonary process. 2. Decrease in size of LEFT lower lobe pulmonary nodule. Electronically Signed   By: [name] M.D.   On: 09/28/2023 16:36     Procedures   Medications Ordered in the ED  lidocaine  (XYLOCAINE ) 2 % viscous mouth solution 15 mL (15 mLs Mouth/Throat Given 09/28/23 1453)  pantoprazole  (PROTONIX ) injection 40 mg (40 mg Intravenous Given 09/28/23 1452)  sodium chloride  0.9 % bolus 500 mL (0 mLs Intravenous Stopped 09/28/23 1935)                                    Medical Decision Making This patient presents to the ED for  concern of pain/difficulty swallowing, this involves an extensive number of treatment options, and is a complaint that carries with it a high risk of complications and morbidity.  The differential diagnosis includes radiation induce esophagitis/gastritis, esophageal stricture, esophageal dysmotility, GERD, PUD   Co morbidities that complicate the patient evaluation  Lung cancer on radiation, T2DM, CHF   Additional history obtained:  Additional history obtained from record review  External records from outside source obtained and reviewed including previous ED note  Lab Tests:  I Ordered, and personally interpreted labs.  The pertinent results include:  CBC notable for hemoglobin of 11.5, this is improved from recent baseline of 10.8 two weeks ago.  CMP notable for borderline hypocalcemia with calcium  of 10.5, otherwise largely stable from previous.  Lipase within normal limits.   Imaging Studies ordered:  I ordered imaging studies including CXR  I independently visualized and interpreted imaging which showed 1. No acute cardiopulmonary process. 2. Decrease in size of LEFT lower lobe pulmonary nodule.    I agree with the radiologist interpretation   Cardiac Monitoring: / EKG:  The patient was maintained on a cardiac monitor.  I personally viewed and interpreted the cardiac monitored which showed an underlying rhythm of: NSR   Consultations Obtained:  I requested consultation with the gastroenterologist,  and discussed lab and imaging findings as well as pertinent plan - they recommend: I spoke with Dr. Alejandra who recommends patient continue carafate  and viscous lidocaine , add on PPI and Nystatin  swish and swallow. Keep scheduled [facility] appointment for endoscopy.   Problem List / ED Course / Critical interventions / Medication management  I ordered medication including viscous lidocaine   and protonix  for pain with swallowing  Reevaluation of the patient after these  medicines showed that the patient stayed the same I have reviewed the patients home medicines and have made adjustments as needed   Social Determinants of Health:  Tobacco use, housing instability   Test / Admission - Considered:  Physical exam is notable as above.  Workup is generally reassuring as above, patient does not have any abdominal tenderness to palpation or guarding/rigidity, do not feel that any imaging of her abdomen is necessary at this time.  She endorses chest pain only with swallowing, I feel that this is entirely related to suspected irritation/inflammation in her esophagus, I do not feel that obtaining a troponin is necessary at this time.  Chest x-ray as above, no evidence of pneumoperitoneum. 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  I spoke with gastroenterology as above, encourage patient to continue Carafate  and viscous lidocaine  at home but will add on Protonix  and nystatin  swish and swallow at the direction of Dr. Lucianne. The patient and her daughter voiced understanding and are in agreement with this plan, strict return precautions discussed. She is appropriate for discharge at this time.    Amount and/or Complexity of Data Reviewed Labs: ordered. Radiology: ordered.  Risk Prescription drug management.        Final diagnoses:  Pain with swallowing    ED Discharge Orders          Ordered    lidocaine  (XYLOCAINE ) 2 % solution  As needed        09/28/23 1937    nystatin  (MYCOSTATIN ) 100000 UNIT/ML suspension  4 times daily        09/28/23 1937    pantoprazole  (PROTONIX ) 20 MG tablet  Daily        09/28/23 1937               Glendia Rocky LOISE DEVONNA 09/28/23 2144     Garrick Charleston, MD 09/29/23 0800

## 2023-09-28 NOTE — Telephone Encounter (Signed)
 Patients daughter called in to report patient being severely weak and unable to eat or drink for a few days. Daughter called ambulance on Friday 10/25/23 due to patient having chest pain and pain with swallowing. Patient was not taken to the emergency room for evaluation. Offered for patient to come into the clinic  today to be seen by Dr. Shannon, per daughter patient is so weak she may have patient transported to [facility] ED.

## 2023-09-28 NOTE — ED Triage Notes (Signed)
 Patient BIB [facility] from home. Has right sided abdominal pain for 2 weeks. No appetite, not eating or drinking normal. Whenever she eats she has pain in her esophagus. Has lung cancer and radiation finished last Wednesday. Was supposed to have appointment today but missed it due to not feeling well.

## 2023-09-28 NOTE — ED Notes (Signed)
Provided pt with water for PO challenge. 

## 2023-09-28 NOTE — Telephone Encounter (Signed)
 Pt's daughter called seeking advice due to pt's current condition. Daughter advised pt missed some appts last week due to not feeling well and is worse today. Pt is constipated and having issues with bowel movement; daughter also reports pt has not eaten or drank much over the weekend and she is concerned about possible dehydration. Call transferred to nurse Nickey's line and daughter advised to leave a message for c/b when team available.

## 2023-09-28 NOTE — Discharge Instructions (Addendum)
 I suspect that your symptoms are likely due to irritation/inflammation of your esophagus caused by your recent radiation treatments. Follow-up with your oncologist and primary care provider. Continue carafate  as previously directed. Restart viscous lidocaine , swallow 15 minutes prior to meals. Start Nystatin  swish and swallow, take 5mLs by mouth four times daily. Start Protonix , take one tablet by mouth once daily. Keep your scheduled appointment with [facility] gastroenterology for endoscopy.

## 2023-09-29 ENCOUNTER — Telehealth: Payer: Self-pay | Admitting: Radiation Oncology

## 2023-09-29 ENCOUNTER — Telehealth: Payer: Self-pay | Admitting: Cardiology

## 2023-09-29 ENCOUNTER — Other Ambulatory Visit: Payer: Self-pay

## 2023-09-29 ENCOUNTER — Ambulatory Visit
Admission: RE | Admit: 2023-09-29 | Discharge: 2023-09-29 | Disposition: A | Discharge status: Home / Self Care | Source: Ambulatory Visit | Attending: Radiation Oncology | Admitting: Radiation Oncology

## 2023-09-29 ENCOUNTER — Ambulatory Visit

## 2023-09-29 ENCOUNTER — Ambulatory Visit: Admitting: Cardiology

## 2023-09-29 ENCOUNTER — Inpatient Hospital Stay

## 2023-09-29 ENCOUNTER — Other Ambulatory Visit: Payer: Self-pay | Admitting: Radiation Oncology

## 2023-09-29 DIAGNOSIS — Z681 Body mass index (BMI) 19 or less, adult: Secondary | ICD-10-CM | POA: Insufficient documentation

## 2023-09-29 DIAGNOSIS — C3432 Malignant neoplasm of lower lobe, left bronchus or lung: Secondary | ICD-10-CM | POA: Diagnosis not present

## 2023-09-29 DIAGNOSIS — C3490 Malignant neoplasm of unspecified part of unspecified bronchus or lung: Secondary | ICD-10-CM

## 2023-09-29 DIAGNOSIS — Z794 Long term (current) use of insulin: Secondary | ICD-10-CM

## 2023-09-29 DIAGNOSIS — Z853 Personal history of malignant neoplasm of breast: Secondary | ICD-10-CM | POA: Diagnosis not present

## 2023-09-29 DIAGNOSIS — R530 Neoplastic (malignant) related fatigue: Secondary | ICD-10-CM | POA: Diagnosis not present

## 2023-09-29 DIAGNOSIS — I2510 Atherosclerotic heart disease of native coronary artery without angina pectoris: Secondary | ICD-10-CM

## 2023-09-29 DIAGNOSIS — I10 Essential (primary) hypertension: Secondary | ICD-10-CM

## 2023-09-29 DIAGNOSIS — R634 Abnormal weight loss: Secondary | ICD-10-CM | POA: Insufficient documentation

## 2023-09-29 DIAGNOSIS — R1311 Dysphagia, oral phase: Secondary | ICD-10-CM | POA: Diagnosis not present

## 2023-09-29 DIAGNOSIS — Z01810 Encounter for preprocedural cardiovascular examination: Secondary | ICD-10-CM

## 2023-09-29 DIAGNOSIS — Z510 Encounter for antineoplastic radiation therapy: Secondary | ICD-10-CM | POA: Diagnosis not present

## 2023-09-29 DIAGNOSIS — F17220 Nicotine dependence, chewing tobacco, uncomplicated: Secondary | ICD-10-CM | POA: Diagnosis not present

## 2023-09-29 DIAGNOSIS — I483 Typical atrial flutter: Secondary | ICD-10-CM

## 2023-09-29 LAB — RAD ONC ARIA SESSION SUMMARY
Course Elapsed Days: 51
Plan Fractions Treated to Date: 1
Plan Prescribed Dose Per Fraction: 2 Gy
Plan Total Fractions Prescribed: 3
Plan Total Prescribed Dose: 6 Gy
Reference Point Dosage Given to Date: 2 Gy
Reference Point Session Dosage Given: 2 Gy
Session Number: 31

## 2023-09-29 MED ORDER — HYDROCODONE-ACETAMINOPHEN 7.5-325 MG/15ML PO SOLN: 10.0000 mL | Freq: Four times a day (QID) | ORAL | 0 refills | Status: DC | PRN

## 2023-09-29 NOTE — Telephone Encounter (Signed)
 Patient cancelled her 09/29/23 visit

## 2023-09-29 NOTE — Telephone Encounter (Signed)
 Sent inbasket to [facility] regarding urgent referral (and limited availability) from Dr. Shannon due to recent weight loss.

## 2023-09-29 NOTE — Telephone Encounter (Signed)
 Calling to see if appt can be down over the phone. Patient had to go to the ER on last night. Please advise

## 2023-09-30 ENCOUNTER — Ambulatory Visit
Admission: RE | Admit: 2023-09-30 | Discharge: 2023-09-30 | Disposition: A | Discharge status: Home / Self Care | Source: Ambulatory Visit | Attending: Radiation Oncology | Admitting: Radiation Oncology

## 2023-09-30 ENCOUNTER — Ambulatory Visit
Admission: RE | Admit: 2023-09-30 | Discharge: 2023-09-30 | Disposition: A | Discharge status: Home / Self Care | Source: Ambulatory Visit | Attending: Hematology and Oncology | Admitting: Hematology and Oncology

## 2023-09-30 ENCOUNTER — Encounter

## 2023-09-30 ENCOUNTER — Ambulatory Visit

## 2023-09-30 ENCOUNTER — Other Ambulatory Visit: Payer: Self-pay

## 2023-09-30 ENCOUNTER — Inpatient Hospital Stay: Admitting: Nutrition

## 2023-09-30 VITALS — BP 144/89 | HR 86 | Temp 97.1°F | Resp 19

## 2023-09-30 DIAGNOSIS — R1311 Dysphagia, oral phase: Secondary | ICD-10-CM | POA: Diagnosis not present

## 2023-09-30 DIAGNOSIS — Z853 Personal history of malignant neoplasm of breast: Secondary | ICD-10-CM | POA: Diagnosis not present

## 2023-09-30 DIAGNOSIS — Z510 Encounter for antineoplastic radiation therapy: Secondary | ICD-10-CM | POA: Diagnosis not present

## 2023-09-30 DIAGNOSIS — R634 Abnormal weight loss: Secondary | ICD-10-CM | POA: Diagnosis not present

## 2023-09-30 DIAGNOSIS — C3432 Malignant neoplasm of lower lobe, left bronchus or lung: Secondary | ICD-10-CM | POA: Diagnosis not present

## 2023-09-30 DIAGNOSIS — R530 Neoplastic (malignant) related fatigue: Secondary | ICD-10-CM | POA: Diagnosis not present

## 2023-09-30 DIAGNOSIS — C3490 Malignant neoplasm of unspecified part of unspecified bronchus or lung: Secondary | ICD-10-CM

## 2023-09-30 DIAGNOSIS — Z681 Body mass index (BMI) 19 or less, adult: Secondary | ICD-10-CM | POA: Diagnosis not present

## 2023-09-30 LAB — RAD ONC ARIA SESSION SUMMARY
Course Elapsed Days: 52
Plan Fractions Treated to Date: 2
Plan Prescribed Dose Per Fraction: 2 Gy
Plan Total Fractions Prescribed: 3
Plan Total Prescribed Dose: 6 Gy
Reference Point Dosage Given to Date: 4 Gy
Reference Point Session Dosage Given: 2 Gy
Session Number: 32

## 2023-09-30 MED ORDER — HYDROMORPHONE HCL 1 MG/ML IJ SOLN
0.2500 mg | Freq: Once | INTRAMUSCULAR | Status: AC
  Administered 2023-09-30: 0.25 mg via INTRAVENOUS

## 2023-09-30 MED ORDER — SODIUM CHLORIDE 0.9 % IV SOLN
INTRAVENOUS | Status: DC
  Filled 2023-09-30 (×2): qty 150

## 2023-09-30 MED ORDER — HYDROMORPHONE HCL 1 MG/ML IJ SOLN
INTRAMUSCULAR | Status: AC
  Filled 2023-09-30: qty 1

## 2023-09-30 NOTE — Progress Notes (Signed)
 Received urgent referral from Dr. Shannon regarding 83 year old female receiving radiation therapy for lung cancer.  Final radiation therapy is scheduled for Friday, September 5.  Patient was diagnosed with breast cancer in 2009 and 2025.  Other medical history includes diabetes, GERD, hypertension, hypothyroidism.  Medications include Amaryl , Synthroid , lidocaine , Prilosec, prednisone , and Carafate .  Labs include sodium 138, potassium 4.0, glucose 87, BUN 22, creatinine 0.97.  Height: 5 feet 7 inches. Weight: 119.2 pounds September 3   BMI: 18.79 129 pounds August 26 127.8 pounds August 19. 133 pounds 3.2 ounces July 24 140 pounds 12.8 ounces June 26  NFPE was deferred.  Patient reports increased pain.  She is receiving IV fluids in radiation therapy clinic. She has decreased oral intake and difficulty swallowing.  She was prescribed lidocaine  and Carafate  which helps.  States she was able to eat half of an egg biscuit this morning.  She drank half a carton of Ensure and an electrolyte water.  Yesterday she recalls eating a Nutrigrain bar and a half of a bottle of Ensure. She feels weak and tired.  The emergency room doctor noted on August 18 patient had reported decreased oral intake for 1 month.  Patient has lost 15% body weight over 3 months which is clinically significant.  Reports no bowel movement for 8 days.  Denies abdominal pain and is passing gas.  She has not tried to take anything.  Nursing providing instructions for bowel regimen.  Nutrition diagnosis:  Unintended weight loss related to radiation therapy for lung cancer as evidenced by 15% weight loss and inadequate oral intake.  Intervention: Educated to choose soft, moist foods in small amounts throughout the day. Try to increase Ensure Plus or equivalent 3 times daily. Continue using Carafate  and lidocaine  to ease swallowing. Educated on appropriate foods. Begin bowel regimen. Provided samples, coupons, and nutrition  facts sheets with contact information.  Monitoring, evaluation, goals: Tolerate increased calories and protein to minimize further weight loss.  Next visit: To be scheduled as needed.    **Disclaimer: This note was dictated with voice recognition software. Similar sounding words can inadvertently be transcribed and this note may contain transcription errors which may not have been corrected upon publication of note.**

## 2023-09-30 NOTE — Addendum Note (Signed)
 Encounter addended by: Dyana Norse, LPN on: 0/05/7972 4:36 PM  Actions taken: Charge Capture section accepted

## 2023-10-01 ENCOUNTER — Ambulatory Visit
Admission: RE | Admit: 2023-10-01 | Discharge: 2023-10-01 | Disposition: A | Discharge status: Home / Self Care | Source: Ambulatory Visit | Attending: Radiation Oncology | Admitting: Radiation Oncology

## 2023-10-01 ENCOUNTER — Inpatient Hospital Stay

## 2023-10-01 ENCOUNTER — Other Ambulatory Visit: Payer: Self-pay

## 2023-10-01 ENCOUNTER — Ambulatory Visit

## 2023-10-01 DIAGNOSIS — F17220 Nicotine dependence, chewing tobacco, uncomplicated: Secondary | ICD-10-CM | POA: Diagnosis not present

## 2023-10-01 DIAGNOSIS — R1311 Dysphagia, oral phase: Secondary | ICD-10-CM | POA: Diagnosis not present

## 2023-10-01 DIAGNOSIS — R634 Abnormal weight loss: Secondary | ICD-10-CM | POA: Diagnosis not present

## 2023-10-01 DIAGNOSIS — Z681 Body mass index (BMI) 19 or less, adult: Secondary | ICD-10-CM | POA: Diagnosis not present

## 2023-10-01 DIAGNOSIS — C3490 Malignant neoplasm of unspecified part of unspecified bronchus or lung: Secondary | ICD-10-CM

## 2023-10-01 DIAGNOSIS — R530 Neoplastic (malignant) related fatigue: Secondary | ICD-10-CM | POA: Diagnosis not present

## 2023-10-01 DIAGNOSIS — Z510 Encounter for antineoplastic radiation therapy: Secondary | ICD-10-CM | POA: Diagnosis not present

## 2023-10-01 DIAGNOSIS — Z853 Personal history of malignant neoplasm of breast: Secondary | ICD-10-CM | POA: Diagnosis not present

## 2023-10-01 DIAGNOSIS — C3432 Malignant neoplasm of lower lobe, left bronchus or lung: Secondary | ICD-10-CM | POA: Diagnosis not present

## 2023-10-01 LAB — RAD ONC ARIA SESSION SUMMARY
Course Elapsed Days: 53
Plan Fractions Treated to Date: 3
Plan Prescribed Dose Per Fraction: 2 Gy
Plan Total Fractions Prescribed: 3
Plan Total Prescribed Dose: 6 Gy
Reference Point Dosage Given to Date: 6 Gy
Reference Point Session Dosage Given: 2 Gy
Session Number: 33

## 2023-10-01 MED ORDER — SODIUM CHLORIDE 0.9 % IV SOLN: INTRAVENOUS | Status: AC

## 2023-10-05 ENCOUNTER — Telehealth: Payer: Self-pay | Admitting: Cardiology

## 2023-10-05 NOTE — Telephone Encounter (Signed)
 I resent clearance appointment note to requesting office

## 2023-10-05 NOTE — Radiation Completion Notes (Addendum)
  Radiation Oncology         [phone] ________________________________  Name: [name] MRN: [id number]  Date of Service: 10/01/2023  DOB: 1940-03-09  End of Treatment Note  Diagnosis: Stage IIIA (cT2a, N2, M0) adenocarcinoma, non-small cell lung cancer, of the LLL  Intent: Curative     ==========DELIVERED PLANS==========  First Treatment Date: 2023-08-09 Last Treatment Date: 2023-10-01   Plan Name: Lung_L Site: Lung, Left Technique: 3D Mode: Photon Dose Per Fraction: 2 Gy Prescribed Dose (Delivered / Prescribed): 60 Gy / 60 Gy Prescribed Fxs (Delivered / Prescribed): 30 / 30   Plan Name: Lung_L_Bst Site: Lung, Left Technique: 3D Mode: Photon Dose Per Fraction: 2 Gy Prescribed Dose (Delivered / Prescribed): 6 Gy / 6 Gy Prescribed Fxs (Delivered / Prescribed): 3 / 3     ====================================   The patient tolerated radiation. She developed fatigue, dysphagia, odynophagia, and anticipated skin changes in the treatment field. Patient was noted to have a 10 lb weight loss in one week, due to not being able to eat or drink adequately throughout her treatment. She was given viscous lidocaine  and IV fluids to helps with esophageal issues.   The patient will return in one month and will continue follow up with Dr. Loretha as well.      Ronita Due, PA-C

## 2023-10-05 NOTE — Telephone Encounter (Signed)
 Office performing procedure on 10/12/23 is requesting Clearance be resent. Please advise.   Surgeon:  DR [name] Surgeon's Group or Practice Name:  [facility] Phone number:  [phone] Fax number:  [phone]

## 2023-10-06 ENCOUNTER — Other Ambulatory Visit

## 2023-10-07 DIAGNOSIS — S39012A Strain of muscle, fascia and tendon of lower back, initial encounter: Secondary | ICD-10-CM | POA: Diagnosis not present

## 2023-10-07 DIAGNOSIS — N1831 Chronic kidney disease, stage 3a: Secondary | ICD-10-CM | POA: Diagnosis not present

## 2023-10-07 DIAGNOSIS — C3490 Malignant neoplasm of unspecified part of unspecified bronchus or lung: Secondary | ICD-10-CM | POA: Diagnosis not present

## 2023-10-12 NOTE — Telephone Encounter (Signed)
 Prescription refill called in to [facility] for Sucralfate  1 GM tablet - Take 1 tablet 4 times daily with meals and at bedtime. Crush and dissolve in of warm water prior to swallowing, take 30 min prior to meals. #120.

## 2023-10-15 NOTE — Telephone Encounter (Signed)
 Patient's daughter called stating they have not received the clearance.

## 2023-10-18 ENCOUNTER — Ambulatory Visit: Payer: Self-pay | Admitting: Acute Care

## 2023-10-18 NOTE — Telephone Encounter (Signed)
 Re faxed the preop clearance to the surgeons office.

## 2023-10-18 NOTE — Telephone Encounter (Signed)
 FYI Only or Action Required?: Action required by provider: request for appointment and needing help with scheduling. Daughter is requesting a call back.  Patient is followed in Pulmonology for LUNG CA, ILD, last seen on 07/09/2023 by Ruthell Lauraine FALCON, NP.  Called Nurse Triage reporting Cough.  Symptoms began yesterday.  Interventions attempted: Rescue inhaler, Nebulizer treatments, and Increased fluids/rest.  Symptoms are: unchanged.  Triage Disposition: See Physician Within 24 Hours  Patient/caregiver understands and will follow disposition?: No, wishes to speak with PCP  Copied from CRM #[id number]. Topic: Clinical - Red Word Triage >> Oct 18, 2023  2:21 PM Russell PARAS wrote: Red Word that prompted transfer to Nurse Triage:   Pt has had symptoms since last night Frequent cough, mostly dry Felt hot last night, but did not check  No wheezing or SOB  Pt of Lauraine Ruthell Reason for Disposition  [1] Known COPD or other severe lung disease (i.e., bronchiectasis, cystic fibrosis, lung surgery) AND [2] symptoms getting worse (i.e., increased sputum purulence or amount, increased breathing difficulty  Answer Assessment - Initial Assessment Questions Patient's daughter calling for patient. Reports mild dry cough that started yesterday. Patient is wanting to be seen due to history. Unable to schedule due to unclear availability and transportation. Daughter is requesting a call back .   1. ONSET: When did the cough begin?      Started last night 2. SEVERITY: How bad is the cough today?      Mild right now but worse at night 3. SPUTUM: Describe the color of your sputum (e.g., none, dry cough; clear, white, yellow, green)     Mostly dry 4. HEMOPTYSIS: Are you coughing up any blood? If Yes, ask: How much? (e.g., flecks, streaks, tablespoons, etc.)     no 5. DIFFICULTY BREATHING: Are you having difficulty breathing? If Yes, ask: How bad is it? (e.g., mild, moderate, severe)      no 6.  FEVER: Do you have a fever? If Yes, ask: What is your temperature, how was it measured, and when did it start?     Felt hot last night but didn't check the temp 7. CARDIAC HISTORY: Do you have any history of heart disease? (e.g., heart attack, congestive heart failure)      A fib 8. LUNG HISTORY: Do you have any history of lung disease?  (e.g., pulmonary embolus, asthma, emphysema)     LUNG CA, lung nodule 9. PE RISK FACTORS: Do you have a history of blood clots? (or: recent major surgery, recent prolonged travel, bedridden)     no 10. OTHER SYMPTOMS: Do you have any other symptoms? (e.g., runny nose, wheezing, chest pain)       no 12. TRAVEL: Have you traveled out of the country in the last month? (e.g., travel history, exposures)       no  Protocols used: Cough - Acute Productive-A-AH

## 2023-10-18 NOTE — Telephone Encounter (Signed)
 Routing to front staff to trying schedule pt acute ov or office visit with Candis PA  Also routing to Lauraine to see if coughing can be prevented.

## 2023-10-19 ENCOUNTER — Encounter: Admitting: Adult Health

## 2023-10-19 ENCOUNTER — Ambulatory Visit
Admission: EM | Admit: 2023-10-19 | Discharge: 2023-10-19 | Disposition: A | Discharge status: Home / Self Care | Attending: Family Medicine | Admitting: Family Medicine

## 2023-10-19 ENCOUNTER — Ambulatory Visit (INDEPENDENT_AMBULATORY_CARE_PROVIDER_SITE_OTHER)

## 2023-10-19 DIAGNOSIS — E039 Hypothyroidism, unspecified: Secondary | ICD-10-CM | POA: Insufficient documentation

## 2023-10-19 DIAGNOSIS — S93699A Other sprain of unspecified foot, initial encounter: Secondary | ICD-10-CM | POA: Insufficient documentation

## 2023-10-19 DIAGNOSIS — Z853 Personal history of malignant neoplasm of breast: Secondary | ICD-10-CM | POA: Insufficient documentation

## 2023-10-19 DIAGNOSIS — D509 Iron deficiency anemia, unspecified: Secondary | ICD-10-CM | POA: Insufficient documentation

## 2023-10-19 DIAGNOSIS — N1831 Chronic kidney disease, stage 3a: Secondary | ICD-10-CM | POA: Insufficient documentation

## 2023-10-19 DIAGNOSIS — N3000 Acute cystitis without hematuria: Secondary | ICD-10-CM | POA: Diagnosis not present

## 2023-10-19 DIAGNOSIS — J301 Allergic rhinitis due to pollen: Secondary | ICD-10-CM | POA: Insufficient documentation

## 2023-10-19 DIAGNOSIS — Z800 Family history of malignant neoplasm of digestive organs: Secondary | ICD-10-CM | POA: Insufficient documentation

## 2023-10-19 DIAGNOSIS — C3490 Malignant neoplasm of unspecified part of unspecified bronchus or lung: Secondary | ICD-10-CM | POA: Insufficient documentation

## 2023-10-19 DIAGNOSIS — R051 Acute cough: Secondary | ICD-10-CM | POA: Insufficient documentation

## 2023-10-19 DIAGNOSIS — Z860100 Personal history of colon polyps, unspecified: Secondary | ICD-10-CM | POA: Insufficient documentation

## 2023-10-19 DIAGNOSIS — K219 Gastro-esophageal reflux disease without esophagitis: Secondary | ICD-10-CM | POA: Insufficient documentation

## 2023-10-19 DIAGNOSIS — C50919 Malignant neoplasm of unspecified site of unspecified female breast: Secondary | ICD-10-CM | POA: Insufficient documentation

## 2023-10-19 DIAGNOSIS — M1990 Unspecified osteoarthritis, unspecified site: Secondary | ICD-10-CM | POA: Insufficient documentation

## 2023-10-19 DIAGNOSIS — I10 Essential (primary) hypertension: Secondary | ICD-10-CM | POA: Insufficient documentation

## 2023-10-19 DIAGNOSIS — E1141 Type 2 diabetes mellitus with diabetic mononeuropathy: Secondary | ICD-10-CM | POA: Insufficient documentation

## 2023-10-19 DIAGNOSIS — J432 Centrilobular emphysema: Secondary | ICD-10-CM | POA: Insufficient documentation

## 2023-10-19 DIAGNOSIS — J8409 Other alveolar and parieto-alveolar conditions: Secondary | ICD-10-CM | POA: Insufficient documentation

## 2023-10-19 DIAGNOSIS — R7989 Other specified abnormal findings of blood chemistry: Secondary | ICD-10-CM | POA: Insufficient documentation

## 2023-10-19 DIAGNOSIS — I671 Cerebral aneurysm, nonruptured: Secondary | ICD-10-CM | POA: Insufficient documentation

## 2023-10-19 DIAGNOSIS — R141 Gas pain: Secondary | ICD-10-CM | POA: Insufficient documentation

## 2023-10-19 DIAGNOSIS — R918 Other nonspecific abnormal finding of lung field: Secondary | ICD-10-CM | POA: Diagnosis not present

## 2023-10-19 DIAGNOSIS — M329 Systemic lupus erythematosus, unspecified: Secondary | ICD-10-CM | POA: Insufficient documentation

## 2023-10-19 DIAGNOSIS — T466X5A Adverse effect of antihyperlipidemic and antiarteriosclerotic drugs, initial encounter: Secondary | ICD-10-CM | POA: Insufficient documentation

## 2023-10-19 DIAGNOSIS — E1169 Type 2 diabetes mellitus with other specified complication: Secondary | ICD-10-CM | POA: Insufficient documentation

## 2023-10-19 DIAGNOSIS — Z1211 Encounter for screening for malignant neoplasm of colon: Secondary | ICD-10-CM | POA: Insufficient documentation

## 2023-10-19 DIAGNOSIS — M3213 Lung involvement in systemic lupus erythematosus: Secondary | ICD-10-CM | POA: Insufficient documentation

## 2023-10-19 DIAGNOSIS — E2839 Other primary ovarian failure: Secondary | ICD-10-CM | POA: Insufficient documentation

## 2023-10-19 DIAGNOSIS — J84112 Idiopathic pulmonary fibrosis: Secondary | ICD-10-CM | POA: Insufficient documentation

## 2023-10-19 DIAGNOSIS — G629 Polyneuropathy, unspecified: Secondary | ICD-10-CM | POA: Insufficient documentation

## 2023-10-19 DIAGNOSIS — R059 Cough, unspecified: Secondary | ICD-10-CM | POA: Diagnosis not present

## 2023-10-19 DIAGNOSIS — I2510 Atherosclerotic heart disease of native coronary artery without angina pectoris: Secondary | ICD-10-CM | POA: Insufficient documentation

## 2023-10-19 DIAGNOSIS — I38 Endocarditis, valve unspecified: Secondary | ICD-10-CM | POA: Insufficient documentation

## 2023-10-19 DIAGNOSIS — R7309 Other abnormal glucose: Secondary | ICD-10-CM | POA: Insufficient documentation

## 2023-10-19 DIAGNOSIS — M3211 Endocarditis in systemic lupus erythematosus: Secondary | ICD-10-CM | POA: Insufficient documentation

## 2023-10-19 DIAGNOSIS — G729 Myopathy, unspecified: Secondary | ICD-10-CM | POA: Insufficient documentation

## 2023-10-19 DIAGNOSIS — Z85118 Personal history of other malignant neoplasm of bronchus and lung: Secondary | ICD-10-CM | POA: Diagnosis not present

## 2023-10-19 DIAGNOSIS — R140 Abdominal distension (gaseous): Secondary | ICD-10-CM | POA: Insufficient documentation

## 2023-10-19 DIAGNOSIS — R0602 Shortness of breath: Secondary | ICD-10-CM | POA: Insufficient documentation

## 2023-10-19 DIAGNOSIS — E7800 Pure hypercholesterolemia, unspecified: Secondary | ICD-10-CM | POA: Insufficient documentation

## 2023-10-19 DIAGNOSIS — I5022 Chronic systolic (congestive) heart failure: Secondary | ICD-10-CM | POA: Insufficient documentation

## 2023-10-19 DIAGNOSIS — Z8679 Personal history of other diseases of the circulatory system: Secondary | ICD-10-CM | POA: Insufficient documentation

## 2023-10-19 DIAGNOSIS — E1165 Type 2 diabetes mellitus with hyperglycemia: Secondary | ICD-10-CM | POA: Insufficient documentation

## 2023-10-19 DIAGNOSIS — R519 Headache, unspecified: Secondary | ICD-10-CM | POA: Insufficient documentation

## 2023-10-19 DIAGNOSIS — G720 Drug-induced myopathy: Secondary | ICD-10-CM | POA: Insufficient documentation

## 2023-10-19 DIAGNOSIS — I771 Stricture of artery: Secondary | ICD-10-CM | POA: Diagnosis not present

## 2023-10-19 DIAGNOSIS — R269 Unspecified abnormalities of gait and mobility: Secondary | ICD-10-CM | POA: Insufficient documentation

## 2023-10-19 LAB — POCT URINE DIPSTICK
Bilirubin, UA: NEGATIVE
Glucose, UA: NEGATIVE mg/dL
Ketones, POC UA: NEGATIVE mg/dL
Nitrite, UA: POSITIVE — AB
POC PROTEIN,UA: 100 — AB
Spec Grav, UA: >=1.03 — AB (ref 1.010–1.025)
Urobilinogen, UA: 0.2 U/dL
pH, UA: 6 (ref 5.0–8.0)

## 2023-10-19 LAB — POC SOFIA SARS ANTIGEN FIA: SARS Coronavirus 2 Ag: NEGATIVE

## 2023-10-19 MED ORDER — BENZONATATE 100 MG PO CAPS: 100.0000 mg | ORAL_CAPSULE | Freq: Three times a day (TID) | ORAL | 0 refills | Status: DC | PRN

## 2023-10-19 MED ORDER — CEFUROXIME AXETIL 250 MG PO TABS: 250.0000 mg | ORAL_TABLET | Freq: Two times a day (BID) | ORAL | 0 refills | Status: AC

## 2023-10-19 NOTE — ED Triage Notes (Addendum)
 Patient reports having a UTI and a terrible cough. It burns when needing to pee and when going, this just started today. The cough started Sunday and just gets worse. No sob. No fever known.   Note: Currently undergoing treatment for Lung Cancer.

## 2023-10-19 NOTE — Discharge Instructions (Addendum)
 The urinalysis showed some leukocytes, red blood cells, and nitrites.  These are signs of a bladder infection  Cefuroxime  250 mg--1 tablet 2 times daily for 7 days.  This is an antibiotic for the bladder infection.  Urine culture is sent, and staff will notify you that it looks like the antibiotic needs to be changed.  On my review there is no pneumonia on your chest x-ray and it looks stable compared to the chest x-ray done 2 to 3 weeks ago.The radiologist will also read your x-ray, and if their interpretation differs significantly from mine, and the management of your condition would change, we will call you.  Take benzonatate  100 mg, 1 tab every 8 hours as needed for cough.

## 2023-10-19 NOTE — ED Provider Notes (Signed)
 [facility]    CSN: [id number] Arrival date & time: 10/19/23  1429      History   Chief Complaint Chief Complaint  Patient presents with   Cough   UTI Symptoms    HPI [name] is a 83 y.o. female.    Cough  Here for cough that began bothering her for 21st and has worsened.  She is sneezing but she denies any rhinorrhea or nasal congestion or postnasal drainage.  She feels that she is having congestion, up from her lungs.  No fever with the above symptoms  She began having dysuria today.  No frequency described.  Past medical history is significant for lung cancer and she has undergone radiation treatment.  Last chest x-ray earlier this month showed decrease in size of the left lower lobe nodule.    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   Bloating 10/19/2023   Centrilobular emphysema (HCC) 10/19/2023   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   Diabetic mononeuropathy associated with type 2 diabetes mellitus (HCC) 10/19/2023   Family history of colon cancer in father 10/19/2023   Flatulence, eructation and gas pain 10/19/2023   Gastroesophageal reflux disease 10/19/2023   History of colonic polyps 10/19/2023   High glucose level 10/19/2023   Headache disorder 10/19/2023   Hyperglycemia due to type 2 diabetes mellitus (HCC) 10/19/2023   History of endocarditis in  adulthood 10/19/2023   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/19/2023   Iron deficiency anemia 10/19/2023   Parietoalveolar pneumopathy (HCC) 10/19/2023   Osteoarthritis 10/19/2023   Neuropathy 10/19/2023   Personal history of malignant neoplasm of breast 10/19/2023   Myopathy, unspecified 10/19/2023   Pure hypercholesterolemia 10/19/2023   Stage 3a chronic kidney disease (HCC) 10/19/2023   Traumatic plantar fasciitis 10/19/2023   Atherosclerosis of coronary artery 10/19/2023   Atherosclerotic heart disease of native coronary artery without angina pectoris 10/19/2023   Chronic systolic heart failure (HCC) 10/19/2023   Type 2 diabetes mellitus with other specified complication (HCC) 10/19/2023   Adverse effect of antihyperlipidemic and antiarteriosclerotic drugs, initial encounter 10/19/2023   Adverse effect of antihyperlipidemic drug 10/19/2023   Family history of malignant neoplasm of gastrointestinal tract 10/19/2023   Shortness of breath 10/19/2023   Encounter for screening for malignant neoplasm of colon 10/19/2023   Decreased estrogen level 10/19/2023   Elevated TSH 10/19/2023   Acquired hypothyroidism 10/19/2023   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   Systemic lupus erythematosus (HCC) 10/19/2023   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) 10/19/2023   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) 10/19/2023   Malignant neoplasm of lung (HCC) 10/19/2023   Essential hypertension 10/19/2023   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) 07/22/2023   Mediastinal adenopathy 06/29/2023   Lung mass 06/20/2023  Pulmonary embolism (HCC) 06/19/2023   S/P left mastectomy 03/31/2023   Genetic testing 03/10/2023   Family history of malignant neoplasm of breast 02/26/2023   Family history of malignant neoplasm of prostate 02/26/2023   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) 02/18/2023   Long term  (current) use of anticoagulants 10/13/2022   Sepsis due to pneumonia (HCC) 10/11/2022   AKI (acute kidney injury) 10/11/2022   Paroxysmal atrial flutter (HCC) 10/11/2022   Diarrhea 10/11/2022   Controlled type 2 diabetes mellitus without complication, without long-term current use of insulin  (HCC) 10/11/2022   Mixed hyperlipidemia 10/11/2022   Atherosclerosis of aorta 10/11/2022   Diabetes mellitus type 2, noninsulin dependent (HCC) 10/11/2022   Benign hypertension 10/11/2022   Acute febrile illness 10/11/2022   Atrial flutter with rapid ventricular response (HCC) 10/11/2022   Chronic diastolic CHF (congestive heart failure) (HCC) 10/11/2022   Syncope 04/25/2022   Near syncope 02/18/2019   Hyponatremia 02/18/2019   ILD (interstitial lung disease) (HCC) 07/30/2016   Dyspnea 07/16/2016   Bibasilar crackles 07/16/2016   History of systemic lupus erythematosus (SLE) (HCC) 07/16/2016   Cough 07/16/2016   Encounter for monitoring ACE-inhibitor therapy 07/16/2016   History of asthma 07/16/2016   Hypothyroidism 01/03/2015   Primary hypertension 01/03/2015   UTI (urinary tract infection) 01/03/2015   Drug-induced hypersensitivity reaction 01/03/2015   Lupus 01/03/2015   Pyrexia 01/02/2015    Past Surgical History:  Procedure Laterality Date   BREAST BIOPSY Left 01/15/2023   US  LT BREAST BX W LOC DEV 1ST LESION IMG BX SPEC US  GUIDE 01/15/2023 [facility]   BREAST LUMPECTOMY Left 2009   BRONCHIAL BIOPSY  06/29/2023   Procedure: BRONCHOSCOPY, WITH BIOPSY;  Surgeon: [name], MD;  Location: [facility] ENDOSCOPY;  Service: Pulmonary;;   BRONCHIAL BRUSHINGS  06/29/2023   Procedure: BRONCHOSCOPY, WITH BRUSH BIOPSY;  Surgeon: [name], MD;  Location: [facility] ENDOSCOPY;  Service: Pulmonary;;   BRONCHIAL NEEDLE ASPIRATION BIOPSY  06/29/2023   Procedure: BRONCHOSCOPY, WITH NEEDLE ASPIRATION BIOPSY;  Surgeon: [name], MD;  Location: [facility] ENDOSCOPY;  Service: Pulmonary;;   IR 3D  INDEPENDENT WKST  05/29/2021   IR ANGIO INTRA EXTRACRAN SEL COM CAROTID INNOMINATE BILAT MOD SED  05/29/2021   IR ANGIO VERTEBRAL SEL VERTEBRAL BILAT MOD SED  05/29/2021   SIMPLE MASTECTOMY WITH AXILLARY SENTINEL NODE BIOPSY Left 03/31/2023   Procedure: LEFT MASTECTOMY;  Surgeon: Vernetta Berg, MD;  Location: [facility] OR;  Service: General;  Laterality: Left;  LMA PEC BLOCK   VIDEO BRONCHOSCOPY WITH ENDOBRONCHIAL NAVIGATION Left 06/29/2023   Procedure: VIDEO BRONCHOSCOPY WITH ENDOBRONCHIAL NAVIGATION;  Surgeon: [name], MD;  Location: [facility] ENDOSCOPY;  Service: Pulmonary;  Laterality: Left;   VIDEO BRONCHOSCOPY WITH ENDOBRONCHIAL ULTRASOUND Bilateral 06/29/2023   Procedure: BRONCHOSCOPY, WITH EBUS;  Surgeon: [name], MD;  Location: [facility] ENDOSCOPY;  Service: Pulmonary;  Laterality: Bilateral;    OB History   No obstetric history on file.      Home Medications    Prior to Admission medications   Medication Sig Start Date End Date Taking? Authorizing Provider  Accu-Chek Softclix Lancets lancets For use when checking blood sugars ICD-10: E11.69 Once a day alternating mornings and evenings before meals; Duration: 100 days 10/27/22  Yes [provider]  Accu-Chek Softclix Lancets lancets For use when checking blood sugars ICD-10: E11.69 Once a day alternating mornings and evenings before meals; Duration: 100 days 10/27/22  Yes [provider]  benzonatate  (TESSALON ) 100 MG  capsule Take 1 capsule (100 mg total) by mouth 3 (three) times daily as needed for cough. 10/19/23  Yes Vonna Sharlet POUR, MD  Blood Glucose Monitoring Suppl (ACCU-CHEK GUIDE ME) w/Device KIT For use when checking blood sugars as directed DX: E11.69 10/27/22  Yes [provider]  cefUROXime  (CEFTIN ) 250 MG tablet Take 1 tablet (250 mg total) by mouth 2 (two) times daily with a meal for 7 days. 10/19/23 10/26/23 Yes Vonna Sharlet POUR, MD  diltiazem  (CARDIZEM ) 120 MG tablet Take 120 mg by mouth daily at  2 PM. 10/16/22  Yes [provider]  fluticasone  (FLONASE  ALLERGY RELIEF) 50 MCG/ACT nasal spray Place 1 spray into both nostrils daily. 06/19/15  Yes [provider]  Ipratropium-Albuterol  (COMBIVENT RESPIMAT) 20-100 MCG/ACT AERS respimat Inhale 1 puff into the lungs every 6 (six) hours as needed. 11/16/17  Yes [provider]  Rivaroxaban  (XARELTO ) 15 MG TABS tablet Take 15 mg by mouth daily with supper. 02/28/19  Yes [provider]  Tiotropium Bromide  Monohydrate (SPIRIVA  RESPIMAT) 1.25 MCG/ACT AERS 2 puff(s) inhaled once a day; Duration: 30 day(s) 08/18/19  Yes [provider]  albuterol  (ACCUNEB ) 0.63 MG/3ML nebulizer solution Take 3 mLs (0.63 mg total) by nebulization every 6 (six) hours as needed for wheezing. 07/09/23   Ruthell Lauraine FALCON, NP  albuterol  (VENTOLIN  HFA) 108 (90 Base) MCG/ACT inhaler Inhale 2 puffs into the lungs every 6 (six) hours as needed for wheezing or shortness of breath. 07/09/23   Ruthell Lauraine FALCON, NP  anastrozole  (ARIMIDEX ) 1 MG tablet Take 1 tablet (1 mg total) by mouth daily. 04/08/23   [name], [name], MD  apixaban  (ELIQUIS ) 5 MG TABS tablet Take 1 tablet (5 mg total) by mouth 2 (two) times daily. Okay to restart on 06/30/2023 06/29/23   [name], MD  atorvastatin  (LIPITOR) 20 MG tablet Take 20 mg by mouth daily.    [provider]  diclofenac  Sodium (VOLTAREN ) 1 % GEL Apply 1 application. topically 4 (four) times daily as needed (pain).    [provider]  diltiazem  (CARDIZEM  CD) 120 MG 24 hr capsule Take 1 capsule (120 mg total) by mouth daily. 01/07/23   Dunn, Dayna N, PA-C  furosemide (LASIX) 20 MG tablet Take 20 mg by mouth daily.    [provider]  gabapentin  (NEURONTIN ) 300 MG capsule Take 300 mg by mouth 3 (three) times daily.    [provider]  glimepiride  (AMARYL ) 1 MG tablet Take 1 mg by mouth daily with breakfast.    [provider]  HYDROcodone -acetaminophen  (HYCET) 7.5-325  mg/15 ml solution Take 10 mLs by mouth 4 (four) times daily as needed for moderate pain (pain score 4-6). 09/29/23 09/28/24  [name], MD  hydroxychloroquine  (PLAQUENIL ) 200 MG tablet Take 200 mg by mouth daily.    [provider]  levothyroxine  (SYNTHROID ) 100 MCG tablet Take 100 mcg by mouth daily. 02/05/21   [provider]  lidocaine  (XYLOCAINE ) 2 % solution Use as directed 15 mLs in the mouth or throat as needed. Swallow 15 min prior to meals 09/28/23   [name], Rocky SAILOR, PA-C  losartan  (COZAAR ) 50 MG tablet Take 50 mg by mouth daily.    [provider]  losartan -hydrochlorothiazide  (HYZAAR) 50-12.5 MG tablet Take 1 tablet by mouth daily.    [provider]  nystatin  (MYCOSTATIN ) 100000 UNIT/ML suspension Take 5 mLs (500,000 Units total) by mouth 4 (four) times daily. 09/28/23   Glendia Rocky SAILOR, PA-C  omeprazole (PRILOSEC)  20 MG capsule Take 20 mg by mouth daily as needed (Indigestion). 12/25/22   [provider]  pantoprazole  (PROTONIX ) 20 MG tablet Take 1 tablet (20 mg total) by mouth daily. 09/28/23   Glendia Rocky SAILOR, PA-C  predniSONE  (DELTASONE ) 20 MG tablet Take 1 tablet (20 mg total) by mouth daily with breakfast. 06/21/23   Samtani, Jai-Gurmukh, MD  promethazine -dextromethorphan  (PROMETHAZINE -DM) 6.25-15 MG/5ML syrup Take 5 mLs by mouth 4 (four) times daily as needed for cough. 06/10/23   Reddick, Johnathan B, NP  spironolactone  (ALDACTONE ) 25 MG tablet Take 0.5 tablets (12.5 mg total) by mouth daily. 01/07/23   Dunn, Dayna N, PA-C  sucralfate  (CARAFATE ) 1 g tablet Take 1 tablet (1 g total) by mouth 4 (four) times daily -  with meals and at bedtime. Crush and dissolve in 10 mL's of warm water prior to swallowing, take 30 min prior to meals 08/23/23   [name], MD    Family History Family History  Problem Relation Age of Onset   Cancer Father        prostate   Prostate cancer Father 68 - 58       metaststic   Asthma Brother    Cancer Daughter         breast   Breast cancer Daughter 45    Social History Social History   Tobacco Use   Smoking status: Never    Passive exposure: Never  Vaping Use   Vaping status: Never Used  Substance Use Topics   Alcohol  use: No   Drug use: No     Allergies   Ciprofloxacin, Crestor  [rosuvastatin ], Lisinopril , and Ofev  [nintedanib]   Review of Systems Review of Systems  Respiratory:  Positive for cough.      Physical Exam Triage Vital Signs ED Triage Vitals  Encounter Vitals Group     BP 10/19/23 1456 106/67     Girls Systolic BP Percentile --      Girls Diastolic BP Percentile --      Boys Systolic BP Percentile --      Boys Diastolic BP Percentile --      Pulse Rate 10/19/23 1456 74     Resp 10/19/23 1456 18     Temp 10/19/23 1456 98.1 F (36.7 C)     Temp Source 10/19/23 1456 Oral     SpO2 10/19/23 1456 98 %     Weight 10/19/23 1451 125 lb (56.7 kg)     Height 10/19/23 1451 5' 6.5 (1.689 m)     Head Circumference --      Peak Flow --      Pain Score 10/19/23 1448 0     Pain Loc --      Pain Education --      Exclude from Growth Chart --    No data found.  Updated Vital Signs BP 106/67 (BP Location: Left Arm)   Pulse 74   Temp 98.1 F (36.7 C) (Oral)   Resp 18   Ht 5' 6.5 (1.689 m)   Wt 56.7 kg   SpO2 98%   BMI 19.87 kg/m   Visual Acuity Right Eye Distance:   Left Eye Distance:   Bilateral Distance:    Right Eye Near:   Left Eye Near:    Bilateral Near:     Physical Exam Vitals reviewed.  Constitutional:      General: She is not in acute distress.    Appearance: She is not ill-appearing, toxic-appearing or diaphoretic.  HENT:  Mouth/Throat:     Mouth: Mucous membranes are moist.  Eyes:     Extraocular Movements: Extraocular movements intact.     Conjunctiva/sclera: Conjunctivae normal.     Pupils: Pupils are equal, round, and reactive to light.  Cardiovascular:     Rate and Rhythm: Normal rate and regular rhythm.     Heart sounds: No  murmur heard. Pulmonary:     Effort: Pulmonary effort is normal. No respiratory distress.     Breath sounds: No stridor. No wheezing, rhonchi or rales.  Musculoskeletal:     Cervical back: Neck supple.  Lymphadenopathy:     Cervical: No cervical adenopathy.  Skin:    Coloration: Skin is not pale.  Neurological:     General: No focal deficit present.     Mental Status: She is alert and oriented to person, place, and time.  Psychiatric:        Behavior: Behavior normal.      UC Treatments / Results  Labs (all labs ordered are listed, but only abnormal results are displayed) Labs Reviewed  POCT URINE DIPSTICK - Abnormal; Notable for the following components:      Result Value   Clarity, UA cloudy (*)    Spec Grav, UA >=1.030 (*)    Blood, UA trace-intact (*)    POC PROTEIN,UA =100 (*)    Nitrite, UA Positive (*)    Leukocytes, UA Large (3+) (*)    All other components within normal limits  POC SOFIA SARS ANTIGEN FIA - Normal  URINE CULTURE    EKG   Radiology No results found.  Procedures Procedures (including critical care time)  Medications Ordered in UC Medications - No data to display  Initial Impression / Assessment and Plan / UC Course  I have reviewed the triage vital signs and the nursing notes.  Pertinent labs & imaging results that were available during my care of the patient were reviewed by me and considered in my medical decision making (see chart for details).     Urinalysis shows white cells and leukocytes and nitrites.  Cefuroxime  is sent in to treat UTI.  By my review the chest x-ray is stable show no infiltrate or new mass compared to her chest x-ray done on September 2.  She is advised of radiology overread.  She is allergic to Cipro.  If she needs treatment for a pneumonia, I will most likely send in  80 to add onto the cefuroxime . Final Clinical Impressions(s) / UC Diagnoses   Final diagnoses:  Acute cough  Acute cystitis without  hematuria     Discharge Instructions      The urinalysis showed some leukocytes, red blood cells, and nitrites.  These are signs of a bladder infection  Cefuroxime  250 mg--1 tablet 2 times daily for 7 days.  This is an antibiotic for the bladder infection.  Urine culture is sent, and staff will notify you that it looks like the antibiotic needs to be changed.  On my review there is no pneumonia on your chest x-ray and it looks stable compared to the chest x-ray done 2 to 3 weeks ago.The radiologist will also read your x-ray, and if their interpretation differs significantly from mine, and the management of your condition would change, we will call you.  Take benzonatate  100 mg, 1 tab every 8 hours as needed for cough.       ED Prescriptions     Medication Sig Dispense Auth. Provider   cefUROXime  (CEFTIN ) 250  MG tablet Take 1 tablet (250 mg total) by mouth 2 (two) times daily with a meal for 7 days. 14 tablet Chaniah Cisse, Sharlet POUR, MD   benzonatate  (TESSALON ) 100 MG capsule Take 1 capsule (100 mg total) by mouth 3 (three) times daily as needed for cough. 21 capsule Albino Bufford K, MD      PDMP not reviewed this encounter.   Vonna Sharlet POUR, MD 10/19/23 [phone]

## 2023-10-19 NOTE — Telephone Encounter (Signed)
 Called PT and offered Acute visit slot. PT states she is unable to make the APT. NFN

## 2023-10-20 ENCOUNTER — Ambulatory Visit: Admitting: Pulmonary Disease

## 2023-10-20 NOTE — Telephone Encounter (Signed)
Pt rescheduled to 9/25

## 2023-10-20 NOTE — Patient Instructions (Signed)
 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 History of systemic lupus erythematosus (SLE) (HCC)  M32.9         PFT stable 2019 -> 2023 -> 2025 NO night desats Oct 2024   Plan  -Return daytime oxygen  and night o2  - CMA to put order 03/08/2023  - do HRCT in 6 months    Follow-up-  6 mnths to see DR Bertrum Brodie but after CT  - might need discussion on esbriet or immuran or cellcept if there is progresion  -Symptom score and exercise Hypoxemia test at follow-up

## 2023-10-20 NOTE — Progress Notes (Unsigned)
 I:06/03/2021 -   83 year old female, never smoked.  Past medical history significant for hypertension, ILD, hypothyroidism, history of asthma, systemic lupus.  Patient Dr. Geronimo, last seen in office on 07/03/2021 by Dr. Gladis for community-acquired pneumonia.        Chief Complaint  Patient presents with   Follow-up      PFT performed today.  Pt states she has been doing okay since last visit and denies any real complaints. States that every so often she will become SOB.   HPI [name] 83 y.o. -returns for follow-up.  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  However symptomatically she was stable.  Therefore we decided to see her again with pulmonary function test.  Pulmonary function test shows continued stability [see below].  Symptom wise she is also stable without any change.  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  However in the 2021 European respiratory general review (https://err.ersjournals.com/content/30/162/210177#T1)  -UIP is excluded from this classification unless there is specific clinical and serologic features.  She does have serologic factors of positive SSA and positive ANA at 1: 320.  However she does not have specific clinical features particularly of Raynaud's or digital tip ulceration or digital tip fissuring or palmar telangiectasia early morning joint stiffness or Raynaud's or unexplained digital edema Gottron sign.  In talking to her she says her rheumatologist is not Dr. Leni.  She is not sure if she has a specific autoimmune diagnosis.  But she does follow-up.  Last visit was March 2023.  I do not have the notes with me.  In the past she has been labeled as lupus before she moved to [place].  Within rheumatologist Dr. Ishmael her previous rheumatologist did not think she had any specific connective tissue  disease.  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  Therefore we discussed antifibrotic therapy again.  Explained UIP is by prognostic marker and the preventative role of antifibrotic's.  Explained side effects.  She is not sure she wants to do it and she wants to reflect on it.  We discussed potential clinical trials and she wants to reflect on this as well.   07/03/21- Dr. Gladis     Chief Complaint  Patient presents with   Acute Visit      Increased SOB and cough x 2 wks. Went to ED 07/02/21- dx with right infiltrate and prescribed doxy but she is hesitant about taking due to her lupus. Her cough has been prod with white sputum.     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 Geronimo and considering antifibrotic, diastolic dysfunction, DM, breast cancer, hypothyroid.   Cough nonstop over last couple of weeks, increased dyspnea. Cough is still productive, pleuritic pain. Seen in urgent care 6/1 (given 3d prednisone  which per pt was not helpful), 6/5 for cough (given tessalon  perles for presumed postviral cough), nasal congestion. Seen in [facility] ED yesterday given doxy for CAP. Covid negative. No fever, hemoptysis.    Otherwise pertinent review of systems is negative.    07/31/2021- Interim hx  Patient presents today for 1 month follow-up for CAP.   She was treated with Augmentin  and azithromycin . She is doing well today. Feels better.  Shortness of breath, cough and wheezing have resolved. She has chronic dyspnea with moderate exertion. No shortness of breath at rest or with simple ADLs. She has a rare dry cough. No oxygen  requirements.: OV with Hope NP for follow-up.  She had been seen by Dr. Gretel 07/03/2021 and diagnosed with CAP.  She was treated with Augmentin  and azithromycin .  Feeling better.  Shortness of breath, cough and wheezing have resolved.  She has chronic dyspnea with moderate exertion.   No increase in baseline.  No oxygen  requirements.  Currently under surveillance not on Ofev .  Will follow-up with Dr. Geronimo in October and discuss restarting.  Patient ended up contacting the office on 8/23 and wanted to go ahead and move forward with restarting antifibrotic therapy - restarted Ofev .   12/17/2021: Today-acute visit Patient presents today for acute visit via virtual visit with her daughter.  A few people in her family tested positive for RSV around 2 weeks ago.  She started having similar symptoms with cough, fatigue, headaches and sinus congestion.  Her symptoms have improved some but she continues to have a productive cough with yellow phlegm and feels like she is having a lot of chest congestion.  She also feels slightly more short of breath compared to her baseline.  Still having some headaches and nasal congestion.  Denies any fevers, chills, hemoptysis, lower extremity swelling.  She is eating and drinking well.  Has not had any GI symptoms.  Has not been monitoring her oxygen  levels.  She did go to urgent care on 11/11.  They swabbed her for COVID and flu which were both negative.  Did not perform any RSV testing.  No imaging was obtained.  She was discharged home with supportive care measures.   OV 01/13/2022  Subjective:  Patient ID: [name], female , DOB: [birth year] , age 83 y.o. , MRN: [id number] , ADDRESS: [address] [place] [address] PCP [name], MD Patient Care Team: [name], MD as PCP - General (Family Medicine) Ishmael Slough, MD as Consulting Physician (Rheumatology)  This Provider for this visit: Treatment Team:  Attending Provider: Geronimo Amel, MD  01/13/2022 -   Chief Complaint  Patient presents with   Follow-up    PFT performed today.  Pt states she is about the same since last visit. States she has been sick twice recently.     HPI [name] 83 y.o. -returns for follow-up.  After my last visit she did  call in and said that she was open to taking antifibrotic's.  We started this in October 2023.  However she took only for few days and then stopped because of abdominal pain and nausea and other GI side effects.  Her daughter [name] advised her also not to take the medicines after reading the side effects.  She tells me today that she does not want to go back on taking nintedanib.She tells me overall she is stable for the year of 2023.  Daughter wanted know what other options there.  We discussed pirfenidone.  Daughter was interested in patient taking pirfenidone but patient rejected this idea because of significant GI side effects even with pirfenidone.  Daughter was also interested in knowing about other possible therapeutic options.  Explained to her that only nintedanib and pirfenidone exist.  Everything else outside this would be a clinical trial.  There are some upcoming trials with inhaled pirfenidone and other agents.  Discussed the concept of clinical trials.  They are processing  this information.     Daughter was interested in knowing the natural history of pulmonary fibrosis and the future prognosis.  Did explain progressive phenotype and the likelihood of ending up on oxygen  class III-4 dyspnea in the future.   Of note the daughter states she got exposed to kids with RSV and has been fatigued this was right before Thanksgiving 2023.  Currently patient feels fine but the daughter feels she is more fatigued.  Walking desaturation test shows that she is more fatigued and she cannot do her 3 labs but there is no change in the heart rate n or pulse ox compared to the past.  She denies any fever or chills or sputum production or worsening cough.      OV 03/26/2022  Subjective:  Patient ID: [name], female , DOB: 08/24/1940 , age 89 y.o. , MRN: [id number] , ADDRESS: [address] [place] [address] PCP [name], MD Patient Care Team: [name], MD as PCP - General  (Family Medicine) Ishmael Slough, MD as Consulting Physician (Rheumatology)  This Provider for this visit: Treatment Team:  Attending Provider: Geronimo Amel, MD   Chief Complaint  Patient presents with   Follow-up    SOB seems worse.  Hard to breathe at times.  Needs albuterol  MDI.      HPI [name] 83 y.o. -returns for follow-up for the above issue.  Initially she said she was doing stable but I think continue to talk to her she said that she might be feeling a little more short of breath on exertion relieved by rest than before.  She then said that was just a single episode 2 weeks ago when she walked to the truck she got quite short of breath and had to stop but then it resolved and she feels baseline but in evaluation of the symptom score sheet her symptoms are much worse than previous visit in December 2023.  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  And she had previously failed nintedanib and she already has slowly progressive disease.  The technician CMA walked her today for single pulse oximetry she was not sure about the accuracy and the reading but it appears patient might be desaturating.  Her last CT scan was in fall 2022.  She was supposed to have a CT scan at this visit but I am unclear why this has not happened and patient's not sure either.  Patient does have does not want to have pulmonary function test.  Her last echocardiogram was also in September 2022.  She is at risk candidate for pulmonary hypertension.  She denies any fever or chills or nausea vomiting or cough or wheezing no changes in medicines no new medical issues.  She also has baseline anemia and this has not been rechecked in a while.  Based on test review.   CT Chest data - HRCT OCt 2022  Narrative & Impression  CLINICAL DATA:  Interstitial lung disease, dyspnea on exertion, history of left breast cancer and radiation   EXAM: CT CHEST WITHOUT CONTRAST    TECHNIQUE: Multidetector CT imaging of the chest was performed following the standard protocol without intravenous contrast. High resolution imaging of the lungs, as well as inspiratory and expiratory imaging, was performed.   COMPARISON:  07/27/2019 02/11/2018, 02/15/2017, 07/22/2016   FINDINGS: Cardiovascular: Aortic atherosclerosis. Normal heart size. Three-vessel coronary artery calcifications. No pericardial effusion.   Mediastinum/Nodes: No enlarged mediastinal, hilar, or axillary lymph  nodes. Small hiatal hernia. Thyroid  gland, trachea, and esophagus demonstrate no significant findings.   Lungs/Pleura: Redemonstrated moderate pulmonary fibrosis in a pattern with apical to basal gradient, featuring irregular peripheral interstitial opacity, septal thickening, traction bronchiectasis, subpleural bronchiolectasis, and areas of honeycombing at the lung bases. Fibrotic findings are slightly worsened compared to prior examination dated 07/27/2019 and clearly worsened over time on examinations dating back to 07/22/2016. No significant air trapping on expiratory phase imaging. No pleural effusion or pneumothorax.   Upper Abdomen: No acute abnormality.   Musculoskeletal: Calcified mass of the medial left breast, unchanged (series 2, image 89). No suspicious bone lesions identified.   IMPRESSION: 1. Redemonstrated moderate pulmonary fibrosis in a pattern with apical to basal gradient, featuring irregular peripheral interstitial opacity, septal thickening, traction bronchiectasis, subpleural bronchiolectasis, and areas of honeycombing at the lung bases. Fibrotic findings are slightly worsened compared to prior examination dated 07/27/2019 and clearly worsened over time on examinations dating back to 07/22/2016. Findings are consistent with UIP per consensus guidelines: Diagnosis of Idiopathic Pulmonary Fibrosis: An Official ATS/ERS/JRS/ALAT Clinical Practice Guideline. Am JINNY [employer] Crit Care Med Vol 198, Iss 5, [phone], Sep 26 2016. 2. Coronary artery disease.   Aortic Atherosclerosis (ICD10-I70.0).     Electronically Signed   By: Marolyn JONETTA Jaksch M.D.   On: 11/11/2020 09:12     OV 06/01/2022  Subjective:  Patient ID: [name], female , DOB: Oct 10, 1940 , age 59 y.o. , MRN: [id number] , ADDRESS: [address] [place] [address] PCP [name], MD Patient Care Team: [name], MD as PCP - General (Family Medicine) Ishmael Slough, MD as Consulting Physician (Rheumatology)  This Provider for this visit: Treatment Team:  Attending Provider: Geronimo Amel, MD    06/01/2022 -   Chief Complaint  Patient presents with   Follow-up    F/up on Echo, HRCT. HRCT not done.         03/26/2022 -    HPI [name] 83 y.o. -returns for follow-up.9 history is gained from talking to the patient but later also from talking to the daughter.  At first the patient states she was feeling great.  She says after her recent hospitalization late March 2024 through early April 2024 for dehydration related to UTI and presyncope she is actually doing better.  She is going dancing.  Her shortness of breath is actually improved and energy levels are better.)  But later when I called the daughter the daughter said the whole reason the appointment was made with us  because she is having worsening cough.  Then the patient later admitted the cough is indeed worse and she has some clear sputum but the patient also said that the cough got worse during the hospital stay [CT suggested some pneumonia] but is currently improving and getting better.  Daughter denied cough is better. Denied dysphagia. There was some mold exposure in the houseThere is no edema there is no orthopnea no paroxysmal nocturnal dyspnea.  Admitted to a sit/stand test x 12 times.  She did not get dyspneic and she did not desaturate.  The results are below.  She was supposed to have a  high-resolution CT chest but this did not happen because of the hospitalization and her having a CT angio chest.  Will proceed today and get chest x-ray and labs  At this point in time I think symptoms are stable exercise hypoxemia is absent.  I think it is best we continue to monitor ILD before committing  to antifibrotic's particularly given her age and previous intolerance to nintedanib and having chronic kidney disease.    ECHO 05/08/22   IMPRESSIONS     1. Left ventricular ejection fraction, by estimation, is 60 to 65%. The  left ventricle has normal function. The left ventricle has no regional  wall motion abnormalities. There is mild concentric left ventricular  hypertrophy. Left ventricular diastolic  parameters are consistent with Grade I diastolic dysfunction (impaired  relaxation). Elevated left ventricular end-diastolic pressure.   2. Right ventricular systolic function is normal. The right ventricular  size is normal.   3. There is severe calcification involving the anterior mitral valve  leaflet that extends into the chordae tendinae. The mitral valve is  degenerative. Trivial mitral valve regurgitation. No evidence of mitral  stenosis.   4. The aortic valve is normal in structure. Aortic valve regurgitation is  not visualized. No aortic stenosis is present.   5. 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    OV 10/29/2022  Subjective:  Patient ID: [name], female , DOB: 1940-06-19 , age 47 y.o. , MRN: [id number] , ADDRESS: [address] [place] [address] PCP [name], MD Patient Care Team: [name], MD as PCP - General (Family Medicine) Ishmael Slough, MD as Consulting Physician (Rheumatology)  This Provider for this visit: Treatment Team:  Attending Provider: Geronimo Amel, MD  10/29/2022 -   Chief Complaint  Patient presents with   Follow-up    Recent PNA 10/11/22. Breathing is doing well and she is not bothered by a cough.       HPI [name] 83 y.o. -returns for follow-up.  Presents with her daughter.  Daughter gives most of the history.  History also from review of the records.  Admitted for 4 days mid September 2024 due to sepsis related to UTI/pneumonia/acute kidney injury anemia and proximal atrial flutter.  Chart review shows elevation in BNP drop in hemoglobin.  Echocardiogram shows diastolic dysfunction.  She is deconditioned at discharge sent home on oxygen  at discharge.  CT angio was negative for PE but suggestive of multifocal pneumonia..  Since then she is 75% better and is progressing well. Gets home physical therapy.  They not sure about the need for oxygen .  In fact today room air pulse ox at rest was 96%.  After sit/stand test 10 times she did not desaturate.  OV 03/08/2023  Subjective:  Patient ID: [name], female , DOB: 1940-11-29 , age 83 y.o. 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 Leni for lupus and is on Plaquenil .  Lupus diagnosed given 2016 - dry eyes/mouth              - on supportive care due to goals of care   -  Briefly tried nintedanib October 2023 and stopped because of GI side effects    Follow-up mild  associated emphysema on CT scan              - on spiriva  - not taking   ECho feb and sept 2022 -             - gr1 ddx. Normal PA/RV and LV             - similar sept 2022  Hospitalization mid September 2024 due to sepsis related to UTI/pneumonia acute kidney injury, anemia and paroxysmal atrial flutter  Breast cancer recurrence January 2025 left breast.  03/08/2023 -   Chief Complaint  Patient presents with   Follow-up    Review spirometry from today.  Doing well.     HPI [name] 83 y.o. -returns for follow-up.  She was hospitalized in September 2024 for sepsis.  After that I saw in October 2024.  She is now coming here for follow-up after getting pulmonary function test.  His shortness of breath is somewhat improved and back to baseline.  She had pulmonary function test and this shows multiyear stability [see below].  It is very reassuring.  In the interim no new medical problems no hospitalizations no ER visits no surgeries.   Review of the medical records indicate that in January 2025 she saw oncology because of a history of breast cancer with lumpectomy and radiation therapy in 2009 but her recent CT scan did show a breast mass.  With Dr. Loretha  she indicated that she is more interested in holistic therapies.  She had a small 1.1 cm mass that appears aggressive and histology.      OV 10/20/2023  Subjective:  Patient ID: [name], female , DOB: 1940/09/11 , age 54 y.o. , MRN: [id number] , ADDRESS: [address] [place] [address] PCP [name], MD Patient Care Team: [name], MD as PCP - General (Family Medicine) Ladona Heinz, MD as PCP - Cardiology (Cardiology) Cindie Ole DASEN, MD as PCP - Electrophysiology (Cardiology) Ishmael Slough, MD as Consulting Physician (Rheumatology) Tyree Nanetta SAILOR, RN as Oncology Nurse Navigator Loretha Ash, MD as Consulting Physician (Hematology and Oncology) [name], MD as Consulting Physician  (Radiation Oncology)  This Provider for this visit: Treatment Team:  Attending Provider: Geronimo Amel, MD    10/20/2023 -  No chief complaint on file.    HPI [name] 83 y.o. -      SYMPTOM SCALE - ILD 06/14/2019   08/17/2019   05/07/2020   02/14/2021   06/03/2021   07/31/2021  01/13/2022  03/26/2022  06/01/2022  10/29/2022  03/08/2023   O2 use ra ra ra ra ra RA ra ra ra ra ra  Shortness of Breath 0 -> 5 scale with 5 being worst (score 6 If unable to do)                At rest 0 2 0 0 2 0 0 2 0 5  0  Simple tasks - showers, clothes change, eating, shaving 1 2 1 4 1 1 1 4  0 5 2  Household (dishes, doing bed, laundry) 3 3 4 4 1  0 2 4 2 5 3   Shopping 5 4 5 5 3 5 3 4 2 4 3   Walking level at own pace 3 3 3 3 2  0 2 3 1 4 2   Walking up Stairs 5 5 4 5 5  5  5 5 0 3 5  Total (30-36) Dyspnea Score 17 19 17 21 14 11 13 22 5 26 15   How bad is your cough? 2   1 0 0 1 1 0 5 x 3  How bad is your fatigue 3   3 3 1 2 3  0 3 x 3  How bad is nausea 00   0 0 0 0 0 0 0 xx 0  How bad is vomiting?   0   0 0 0 0 0 0 0 x 0  How bad is diarrhea? 0   0 0 0 0 0 0 0 x 0  How bad is anxiety? 0 0 0 2 0 3 0 0 1  2  How bad is depression 0 1 0 3 0 1 0 1 0 xx x       Simple office walk 185 feet x  3 laps goal with forehead probe 08/18/2017       02/15/2018   06/14/2019   05/07/2020   02/14/2021   06/03/21   07/31/2021  01/13/2022  06/01/2022  10/29/2022   O2 used *room air Room air ra ra ra   RA ra ra ra  Number laps completed 3 3 3 3 3   3 2  of 3 Sit stand x 12 times Sit stand x 10  Comments about pace Normal pace Slow pace Slow pace slow Slow pace   Slow pace  Slow pace    Resting Pulse Ox/HR 100% and 67/min 100% and 73/min 100% and 63/mi 100% and 67/min 100% and 64   100% and 71 100% and HR 68 98% ad HR 88 98% and HR 71  Final Pulse Ox/HR 100% and 91/min 100% and 96/min 92% and 84/mi 100% and 91/ 100% and 91   98% and 88 98% and HR 92 96% and HR 86 96% and HR 71  Desaturated </= 88% no No  no no  no   No no    Desaturated <= 3% points no no Yues, 8 ponts no no   No no    Got Tachycardic >/= 90/min yes yes no yes yes   No no    Symptoms at end of test Mild dyspnea Mild dyspnea Mild dyspnea Mild dyspnea Mild dyspnea   Mild dyspnea Stopped early due to fatigue No dyspnea   Miscellaneous comments x x     x   x       CT Chest data from date: may 2025  - personally visualized and independently interpreted : *** - my findings are: ***   IMPRESSION: 1. Filling defect within segmental branch of the left lower lobe pulmonary artery is suspicious for pulmonary embolism. 2. Signs of chronic interstitial lung disease with bilateral peripheral, subpleural interstitial reticulation and honeycombing with a lower lung zone predominance. These findings appear similar to the prior examination. 3. Bilateral heterogeneous ground-glass attenuation is noted within both lower lobes with mild consolidative changes noted in the superior segment of left lower lobe. Although nonspecific these findings may reflect superimposed inflammation or infection versus progressive changes of interstitial lung disease. 4. Increased size of mediastinal and bilateral hilar lymph nodes. These are favored to be reactive in etiology. 5. Enlarging masslike density within the left lower lobe which measures 3.1 x 3.3 cm. On the previous exam from 10/22/2022 this measured 2.8 x 2.1 cm. On the examination from 04/26/2022 this area measured 1.5 x 1.5 cm. Findings are concerning for enlarging neoplasm. Recommend referral  to pulmonary medicine or thoracic surgery for further management. 6. Coronary artery calcifications. 7.  Aortic Atherosclerosis (ICD10-I70.0).   Critical Value/emergent results were called by telephone at the time of interpretation on 06/19/2023 at 8:34 am to provider Koren Arch, who verbally acknowledged these results.   Choose 1     Electronically Signed   By: Waddell Calk M.D.   On: 06/19/2023  08:34 PFT     Latest Ref Rng & Units 03/08/2023    1:11 PM 01/13/2022   12:32 PM 06/03/2021   10:37 AM 02/14/2021   12:36 PM 05/18/2017   12:44 PM  PFT Results  FVC-Pre L 1.83   1.90  1.84  1.61   FVC-Predicted Pre % 62   80  76  63   Pre FEV1/FVC % % 82   82  84  93   FEV1-Pre L 1.50   1.56  1.54  1.51   FEV1-Predicted Pre % 67   84  82  76   DLCO uncorrected ml/min/mmHg 9.13  18.06  9.75  9.78    DLCO UNC% % 44  87  47  47    DLCO corrected ml/min/mmHg 9.13  18.06  10.11  9.78    DLCO COR %Predicted % 44  87  48  47    DLVA Predicted % 75  91  76  70    TLC L  4.89      TLC % Predicted %  89      RV % Predicted %  107           LAB RESULTS last 96 hours DG Chest 2 View Result Date: 10/19/2023 CLINICAL DATA:  Cough.  History of lung cancer. EXAM: CHEST - 2 VIEW COMPARISON:  Multiple prior imaging studies. The most recent chest x-rays 09/28/2023. FINDINGS: The cardiac silhouette, mediastinal and hilar contours are within normal limits and stable. Stable tortuosity and calcification of the thoracic aorta. Ill-defined opacity at the left lung base but not as pronounced on the prior study which may represent treatment response. Underlying emphysematous changes and pulmonary scarring. No pulmonary edema or pleural effusion. Improved right basilar aeration suggesting resolving infiltrates. No significant bony findings. IMPRESSION: 1. Ill-defined opacity at the left lung base but not as pronounced on the prior study which may represent treatment response. 2. Improved right basilar aeration suggesting resolving infiltrate. Electronically Signed   By: MYRTIS Stammer M.D.   On: 10/19/2023 16:11         has a past medical history of Acute bronchitis (12/17/2021), Acute cystitis (02/20/2019), Arthritis, Atrial flutter (HCC), Breast cancer (HCC), Diabetes mellitus without complication (HCC), Dyspnea, GERD (gastroesophageal reflux disease), Hypertension, Hypothyroidism, Libman Sacks endocarditis  (HCC), Lupus, and Pneumonia (07/31/2021).   reports that she has never smoked. She has never been exposed to tobacco smoke. She does not have any smokeless tobacco history on file.  Past Surgical History:  Procedure Laterality Date   BREAST BIOPSY Left 01/15/2023   US  LT BREAST BX W LOC DEV 1ST LESION IMG BX SPEC US  GUIDE 01/15/2023 [facility]   BREAST LUMPECTOMY Left 2009   BRONCHIAL BIOPSY  06/29/2023   Procedure: BRONCHOSCOPY, WITH BIOPSY;  Surgeon: [name], MD;  Location: [facility] ENDOSCOPY;  Service: Pulmonary;;   BRONCHIAL BRUSHINGS  06/29/2023   Procedure: BRONCHOSCOPY, WITH BRUSH BIOPSY;  Surgeon: [name], MD;  Location: [facility] ENDOSCOPY;  Service: Pulmonary;;   BRONCHIAL NEEDLE ASPIRATION BIOPSY  06/29/2023   Procedure: BRONCHOSCOPY, WITH NEEDLE  ASPIRATION BIOPSY;  Surgeon: [name], MD;  Location: [facility] ENDOSCOPY;  Service: Pulmonary;;   IR 3D INDEPENDENT WKST  05/29/2021   IR ANGIO INTRA EXTRACRAN SEL COM CAROTID INNOMINATE BILAT MOD SED  05/29/2021   IR ANGIO VERTEBRAL SEL VERTEBRAL BILAT MOD SED  05/29/2021   SIMPLE MASTECTOMY WITH AXILLARY SENTINEL NODE BIOPSY Left 03/31/2023   Procedure: LEFT MASTECTOMY;  Surgeon: Vernetta Berg, MD;  Location: [facility] OR;  Service: General;  Laterality: Left;  LMA PEC BLOCK   VIDEO BRONCHOSCOPY WITH ENDOBRONCHIAL NAVIGATION Left 06/29/2023   Procedure: VIDEO BRONCHOSCOPY WITH ENDOBRONCHIAL NAVIGATION;  Surgeon: [name], MD;  Location: [facility] ENDOSCOPY;  Service: Pulmonary;  Laterality: Left;   VIDEO BRONCHOSCOPY WITH ENDOBRONCHIAL ULTRASOUND Bilateral 06/29/2023   Procedure: BRONCHOSCOPY, WITH EBUS;  Surgeon: [name], MD;  Location: [facility] ENDOSCOPY;  Service: Pulmonary;  Laterality: Bilateral;    Allergies  Allergen Reactions   Ciprofloxacin Itching, Swelling and Other (See Comments)    Possibly causing tremors?   Crestor  [Rosuvastatin ] Other (See Comments)    Myalgia and back pain   Lisinopril  Cough   Ofev  [Nintedanib]  Nausea Only    Immunization History  Administered Date(s) Administered    sv, Bivalent, Protein Subunit Rsvpref,pf [place]) 01/29/2022   Fluad Trivalent(High Dose 65+) 02/28/2019, 10/29/2022   Hepatitis B, PED/ADOLESCENT 11/13/1996, 07/17/1997, 03/05/1998   INFLUENZA, HIGH DOSE SEASONAL PF 11/09/2016, 10/27/2018, 10/26/2020, 10/22/2021   Influenza, Quadrivalent, Recombinant, Inj, Pf 11/21/2020   Influenza, Seasonal, Injecte, Preservative Fre 11/16/2017   Influenza,inj,Quad PF,6+ Mos 10/27/2015   PFIZER Comirnaty(Gray Top)Covid-19 Tri-Sucrose Vaccine 05/13/2019   PFIZER(Purple Top)SARS-COV-2 Vaccination 04/22/2019, 05/13/2019, 01/15/2020   Pneumococcal Conjugate-13 07/11/2013, 02/15/2018   Pneumococcal Polysaccharide-23 03/18/2020   Tdap 03/20/2021    Family History  Problem Relation Age of Onset   Cancer Father        prostate   Prostate cancer Father 60 - 6       metaststic   Asthma Brother    Cancer Daughter        breast   Breast cancer Daughter 37     Current Outpatient Medications:    Accu-Chek Softclix Lancets lancets, For use when checking blood sugars ICD-10: E11.69 Once a day alternating mornings and evenings before meals; Duration: 100 days, Disp: , Rfl:    Accu-Chek Softclix Lancets lancets, For use when checking blood sugars ICD-10: E11.69 Once a day alternating mornings and evenings before meals; Duration: 100 days, Disp: , Rfl:    albuterol  (ACCUNEB ) 0.63 MG/3ML nebulizer solution, Take 3 mLs (0.63 mg total) by nebulization every 6 (six) hours as needed for wheezing., Disp: 75 mL, Rfl: 12   albuterol  (VENTOLIN  HFA) 108 (90 Base) MCG/ACT inhaler, Inhale 2 puffs into the lungs every 6 (six) hours as needed for wheezing or shortness of breath., Disp: 8.5 g, Rfl: 2   anastrozole  (ARIMIDEX ) 1 MG tablet, Take 1 tablet (1 mg total) by mouth daily., Disp: 90 tablet, Rfl: 3   apixaban  (ELIQUIS ) 5 MG TABS tablet, Take 1 tablet (5 mg total) by mouth 2 (two) times daily.  Okay to restart on 06/30/2023, Disp: , Rfl:    atorvastatin  (LIPITOR) 20 MG tablet, Take 20 mg by mouth daily., Disp: , Rfl:    benzonatate  (TESSALON ) 100 MG capsule, Take 1 capsule (100 mg total) by mouth 3 (three) times daily as needed for cough., Disp: 21 capsule, Rfl: 0   Blood Glucose Monitoring Suppl (ACCU-CHEK GUIDE ME) w/Device KIT, For use when checking blood sugars as  directed DX: E11.69, Disp: , Rfl:    cefUROXime  (CEFTIN ) 250 MG tablet, Take 1 tablet (250 mg total) by mouth 2 (two) times daily with a meal for 7 days., Disp: 14 tablet, Rfl: 0   diclofenac  Sodium (VOLTAREN ) 1 % GEL, Apply 1 application. topically 4 (four) times daily as needed (pain)., Disp: , Rfl:    diltiazem  (CARDIZEM  CD) 120 MG 24 hr capsule, Take 1 capsule (120 mg total) by mouth daily., Disp: 90 capsule, Rfl: 3   diltiazem  (CARDIZEM ) 120 MG tablet, Take 120 mg by mouth daily at 2 PM., Disp: , Rfl:    fluticasone  (FLONASE  ALLERGY RELIEF) 50 MCG/ACT nasal spray, Place 1 spray into both nostrils daily., Disp: , Rfl:    furosemide (LASIX) 20 MG tablet, Take 20 mg by mouth daily., Disp: , Rfl:    gabapentin  (NEURONTIN ) 300 MG capsule, Take 300 mg by mouth 3 (three) times daily., Disp: , Rfl:    glimepiride  (AMARYL ) 1 MG tablet, Take 1 mg by mouth daily with breakfast., Disp: , Rfl:    HYDROcodone -acetaminophen  (HYCET) 7.5-325 mg/15 ml solution, Take 10 mLs by mouth 4 (four) times daily as needed for moderate pain (pain score 4-6)., Disp: 118 mL, Rfl: 0   hydroxychloroquine  (PLAQUENIL ) 200 MG tablet, Take 200 mg by mouth daily., Disp: , Rfl:    Ipratropium-Albuterol  (COMBIVENT RESPIMAT) 20-100 MCG/ACT AERS respimat, Inhale 1 puff into the lungs every 6 (six) hours as needed., Disp: , Rfl:    levothyroxine  (SYNTHROID ) 100 MCG tablet, Take 100 mcg by mouth daily., Disp: , Rfl:    lidocaine  (XYLOCAINE ) 2 % solution, Use as directed 15 mLs in the mouth or throat as needed. Swallow 15 min prior to meals, Disp: 250 mL, Rfl: 0    losartan  (COZAAR ) 50 MG tablet, Take 50 mg by mouth daily., Disp: , Rfl:    losartan -hydrochlorothiazide  (HYZAAR) 50-12.5 MG tablet, Take 1 tablet by mouth daily., Disp: , Rfl:    nystatin  (MYCOSTATIN ) 100000 UNIT/ML suspension, Take 5 mLs (500,000 Units total) by mouth 4 (four) times daily., Disp: 60 mL, Rfl: 0   omeprazole (PRILOSEC) 20 MG capsule, Take 20 mg by mouth daily as needed (Indigestion)., Disp: , Rfl:    pantoprazole  (PROTONIX ) 20 MG tablet, Take 1 tablet (20 mg total) by mouth daily., Disp: 30 tablet, Rfl: 0   predniSONE  (DELTASONE ) 20 MG tablet, Take 1 tablet (20 mg total) by mouth daily with breakfast., Disp: 4 tablet, Rfl: 0   promethazine -dextromethorphan  (PROMETHAZINE -DM) 6.25-15 MG/5ML syrup, Take 5 mLs by mouth 4 (four) times daily as needed for cough., Disp: 118 mL, Rfl: 0   Rivaroxaban  (XARELTO ) 15 MG TABS tablet, Take 15 mg by mouth daily with supper., Disp: , Rfl:    spironolactone  (ALDACTONE ) 25 MG tablet, Take 0.5 tablets (12.5 mg total) by mouth daily., Disp: 45 tablet, Rfl: 3   sucralfate  (CARAFATE ) 1 g tablet, Take 1 tablet (1 g total) by mouth 4 (four) times daily -  with meals and at bedtime. Crush and dissolve in 10 mL's of warm water prior to swallowing, take 30 min prior to meals, Disp: 120 tablet, Rfl: 1   Tiotropium Bromide  Monohydrate (SPIRIVA  RESPIMAT) 1.25 MCG/ACT AERS, 2 puff(s) inhaled once a day; Duration: 30 day(s), Disp: , Rfl:       Objective:   There were no vitals filed for this visit.  Estimated body mass index is 19.87 kg/m as calculated from the following:   Height as of 10/19/23: 5' 6.5 (1.689 m).  Weight as of 10/19/23: 125 lb (56.7 kg).  @WEIGHTCHANGE @  There were no vitals filed for this visit.   Physical Exam   General: No distress. *** O2 at rest: *** Cane present: *** Sitting in wheel chair: *** Frail: *** Obese: *** Neuro: Alert and Oriented x 3. GCS 15. Speech normal Psych: Pleasant Resp:  Barrel Chest - ***.  Wheeze -  ***, Crackles - ***, No overt respiratory distress CVS: Normal heart sounds. Murmurs - *** Ext: Stigmata of Connective Tissue Disease - *** HEENT: Normal upper airway. PEERL +. No post nasal drip        Assessment/     Assessment & Plan Interstitial lung disease due to connective tissue disease (HCC)  UIP (usual interstitial pneumonitis) (HCC)  History of systemic lupus erythematosus (SLE) (HCC)    PLAN Patient Instructions     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 History of systemic lupus erythematosus (SLE) (HCC)  M32.9         PFT stable 2019 -> 2023 -> 2025 NO night desats Oct 2024   Plan  -Return daytime oxygen  and night o2  - CMA to put order 03/08/2023  - do HRCT in 6 months    Follow-up-  6 mnths to see DR Geronimo but after CT  - might need discussion on esbriet or immuran or cellcept if there is progresion  -Symptom score and exercise Hypoxemia test at follow-up      FOLLOWUP    No follow-ups on file.    SIGNATURE    Dr. Dorethia Geronimo, M.D., F.C.C.P,  Pulmonary and Critical Care Medicine Staff Physician, [facility] Center Director - Interstitial Lung Disease  Program  Pulmonary Fibrosis [facility] at [facility] [place], [place], [address]  Pager: [phone], If no answer or between  15:00h - 7:00h: call [phone] Telephone: [phone]  9:18 PM 10/20/2023   Moderate Complexity MDM OFFICE  2021 E/M guidelines, first released in 2021, with minor revisions added in 2023 and 2024 Must meet the requirements for 2 out of 3 dimensions to qualify.    Number and complexity of problems addressed Amount and/or complexity of data reviewed Risk of complications and/or morbidity  One or more chronic illness with mild exacerbation, OR progression, OR  side effects of  treatment  Two or more stable chronic illnesses  One undiagnosed new problem with uncertain prognosis  One acute illness with systemic symptoms   One Acute complicated injury Must meet the requirements for 1 of 3 of the categories)  Category 1: Tests and documents, historian  Any combination of 3 of the following:  Assessment requiring an independent historian  Review of prior external note(s) from each unique source  Review of results of each unique test  Ordering of each unique test    Category 2: Interpretation of tests   Independent interpretation of a test performed by another physician/other qualified health care professional (not separately reported)  Category 3: Discuss management/tests  Discussion of management or test interpretation with external physician/other qualified health care professional/appropriate source (not separately reported) Moderate risk of morbidity from additional diagnostic testing or treatment Examples only:  Prescription drug management  Decision regarding minor surgery with identfied patient or procedure risk factors  Decision regarding elective major surgery  without identified patient or procedure risk factors  Diagnosis or treatment significantly limited by social determinants of health             HIGh Complexity  OFFICE   2021 E/M guidelines, first released in 2021, with minor revisions added in 2023. Must meet the requirements for 2 out of 3 dimensions to qualify.    Number and complexity of problems addressed Amount and/or complexity of data reviewed Risk of complications and/or morbidity  Severe exacerbation of chronic illness  Acute or chronic illnesses that may pose a threat to life or bodily function, e.g., multiple trauma, acute MI, pulmonary embolus, severe respiratory distress, progressive rheumatoid arthritis, psychiatric illness with potential threat to self or others, peritonitis, acute renal failure, abrupt  change in neurological status Must meet the requirements for 2 of 3 of the categories)  Category 1: Tests and documents, historian  Any combination of 3 of the following:  Assessment requiring an independent historian  Review of prior external note(s) from each unique source  Review of results of each unique test  Ordering of each unique test    Category 2: Interpretation of tests    Independent interpretation of a test performed by another physician/other qualified health care professional (not separately reported)  Category 3: Discuss management/tests  Discussion of management or test interpretation with external physician/other qualified health care professional/appropriate source (not separately reported)  HIGH risk of morbidity from additional diagnostic testing or treatment Examples only:  Drug therapy requiring intensive monitoring for toxicity  Decision for elective major surgery with identified pateint or procedure risk factors  Decision regarding hospitalization or escalation of level of care  Decision for DNR or to de-escalate care   Parenteral controlled  substances            LEGEND - Independent interpretation involves the interpretation of a test for which there is a CPT code, and an interpretation or report is customary. When a review and interpretation of a test is performed and documented by the provider, but not separately reported (billed), then this would represent an independent interpretation. This report does not need to conform to the usual standards of a complete report of the test. This does not include interpretation of tests that do not have formal reports such as a complete blood count with differential and blood cultures. Examples would include reviewing a chest radiograph and documenting in the medical record an interpretation, but not separately reporting (billing) the interpretation of the chest radiograph.   An appropriate source  includes professionals who are not health care professionals but may be involved in the management of the patient, such as a Clinical research associate, upper officer, case manager or teacher, and does not include discussion with family or informal caregivers.    - SDOH: SDOH are the conditions in the environments where people are born, live, learn, work, play, worship, and age that affect a wide range of health, functioning, and quality-of-life outcomes and risks. (e.g., housing, food insecurity, transportation, etc.). SDOH-related Z codes ranging from Z55-Z65 are the ICD-10-CM diagnosis codes used to document SDOH data Z55 - Problems related to education and literacy Z56 - Problems related to employment and unemployment Z57 - Occupational exposure to risk factors Z58 - Problems related to physical environment Z59 - Problems related to housing and economic circumstances [phone] - Problems related to social environment [phone] - Problems related to upbringing [phone] - Other problems related to primary support group, including family circumstances Z74 - Problems related to certain  psychosocial circumstances Z65 - Problems related to other psychosocial circumstances

## 2023-10-20 NOTE — Telephone Encounter (Signed)
 Pt scheduled acute ov 9/24 with Dr. Kara   Advised pt to wear a mask into office visit.

## 2023-10-21 ENCOUNTER — Ambulatory Visit (HOSPITAL_COMMUNITY): Payer: Self-pay

## 2023-10-21 ENCOUNTER — Ambulatory Visit (INDEPENDENT_AMBULATORY_CARE_PROVIDER_SITE_OTHER)

## 2023-10-21 ENCOUNTER — Encounter: Payer: Self-pay | Admitting: Internal Medicine

## 2023-10-21 ENCOUNTER — Ambulatory Visit (INDEPENDENT_AMBULATORY_CARE_PROVIDER_SITE_OTHER): Admitting: Internal Medicine

## 2023-10-21 VITALS — BP 116/74 | HR 88 | Temp 98.9°F | Ht 66.5 in | Wt 124.2 lb

## 2023-10-21 DIAGNOSIS — J00 Acute nasopharyngitis [common cold]: Secondary | ICD-10-CM | POA: Diagnosis not present

## 2023-10-21 DIAGNOSIS — N390 Urinary tract infection, site not specified: Secondary | ICD-10-CM | POA: Diagnosis not present

## 2023-10-21 DIAGNOSIS — J8489 Other specified interstitial pulmonary diseases: Secondary | ICD-10-CM

## 2023-10-21 DIAGNOSIS — C3490 Malignant neoplasm of unspecified part of unspecified bronchus or lung: Secondary | ICD-10-CM | POA: Diagnosis not present

## 2023-10-21 DIAGNOSIS — J84112 Idiopathic pulmonary fibrosis: Secondary | ICD-10-CM

## 2023-10-21 DIAGNOSIS — J209 Acute bronchitis, unspecified: Secondary | ICD-10-CM

## 2023-10-21 DIAGNOSIS — M329 Systemic lupus erythematosus, unspecified: Secondary | ICD-10-CM

## 2023-10-21 DIAGNOSIS — Z85118 Personal history of other malignant neoplasm of bronchus and lung: Secondary | ICD-10-CM

## 2023-10-21 DIAGNOSIS — B9620 Unspecified Escherichia coli [E. coli] as the cause of diseases classified elsewhere: Secondary | ICD-10-CM

## 2023-10-21 DIAGNOSIS — M359 Systemic involvement of connective tissue, unspecified: Secondary | ICD-10-CM

## 2023-10-21 LAB — URINE CULTURE: Culture: >=100000 — AB

## 2023-10-21 LAB — POCT INFLUENZA A/B
Influenza A, POC: NEGATIVE
Influenza B, POC: NEGATIVE

## 2023-10-21 MED ORDER — PREDNISONE 10 MG PO TABS: ORAL_TABLET | ORAL | 0 refills | Status: DC

## 2023-10-21 NOTE — Addendum Note (Signed)
 Addended by: Mykenzie Ebanks M on: 10/21/2023 03:29 PM   Modules accepted: Orders

## 2023-10-28 ENCOUNTER — Telehealth: Payer: Self-pay

## 2023-10-28 NOTE — Telephone Encounter (Signed)
 Spoke with patient and confirmed appointment for 10/3.SABRA

## 2023-10-29 ENCOUNTER — Inpatient Hospital Stay: Attending: Hematology and Oncology | Admitting: Hematology and Oncology

## 2023-10-29 ENCOUNTER — Ambulatory Visit: Admitting: Hematology and Oncology

## 2023-10-29 DIAGNOSIS — K219 Gastro-esophageal reflux disease without esophagitis: Secondary | ICD-10-CM | POA: Diagnosis not present

## 2023-10-29 DIAGNOSIS — I272 Pulmonary hypertension, unspecified: Secondary | ICD-10-CM | POA: Diagnosis not present

## 2023-10-29 DIAGNOSIS — M329 Systemic lupus erythematosus, unspecified: Secondary | ICD-10-CM | POA: Diagnosis not present

## 2023-10-29 DIAGNOSIS — I2510 Atherosclerotic heart disease of native coronary artery without angina pectoris: Secondary | ICD-10-CM | POA: Diagnosis not present

## 2023-10-29 DIAGNOSIS — I480 Paroxysmal atrial fibrillation: Secondary | ICD-10-CM | POA: Diagnosis not present

## 2023-10-29 DIAGNOSIS — J849 Interstitial pulmonary disease, unspecified: Secondary | ICD-10-CM | POA: Diagnosis not present

## 2023-10-29 DIAGNOSIS — Z7901 Long term (current) use of anticoagulants: Secondary | ICD-10-CM | POA: Diagnosis not present

## 2023-10-29 DIAGNOSIS — Z9012 Acquired absence of left breast and nipple: Secondary | ICD-10-CM | POA: Diagnosis not present

## 2023-10-29 DIAGNOSIS — N1831 Chronic kidney disease, stage 3a: Secondary | ICD-10-CM | POA: Diagnosis not present

## 2023-10-29 DIAGNOSIS — C3432 Malignant neoplasm of lower lobe, left bronchus or lung: Secondary | ICD-10-CM | POA: Diagnosis not present

## 2023-10-29 DIAGNOSIS — C50512 Malignant neoplasm of lower-outer quadrant of left female breast: Secondary | ICD-10-CM | POA: Diagnosis not present

## 2023-10-29 DIAGNOSIS — I129 Hypertensive chronic kidney disease with stage 1 through stage 4 chronic kidney disease, or unspecified chronic kidney disease: Secondary | ICD-10-CM | POA: Diagnosis not present

## 2023-10-29 DIAGNOSIS — Z7984 Long term (current) use of oral hypoglycemic drugs: Secondary | ICD-10-CM | POA: Diagnosis not present

## 2023-10-29 DIAGNOSIS — E039 Hypothyroidism, unspecified: Secondary | ICD-10-CM | POA: Diagnosis not present

## 2023-10-29 DIAGNOSIS — M25512 Pain in left shoulder: Secondary | ICD-10-CM | POA: Diagnosis not present

## 2023-10-29 DIAGNOSIS — Z170 Estrogen receptor positive status [ER+]: Secondary | ICD-10-CM | POA: Diagnosis not present

## 2023-10-29 DIAGNOSIS — Z79811 Long term (current) use of aromatase inhibitors: Secondary | ICD-10-CM | POA: Diagnosis not present

## 2023-10-29 DIAGNOSIS — Z79899 Other long term (current) drug therapy: Secondary | ICD-10-CM | POA: Diagnosis not present

## 2023-10-29 DIAGNOSIS — Z853 Personal history of malignant neoplasm of breast: Secondary | ICD-10-CM | POA: Diagnosis not present

## 2023-10-29 DIAGNOSIS — E785 Hyperlipidemia, unspecified: Secondary | ICD-10-CM | POA: Diagnosis not present

## 2023-10-29 DIAGNOSIS — I214 Non-ST elevation (NSTEMI) myocardial infarction: Secondary | ICD-10-CM | POA: Diagnosis not present

## 2023-10-29 DIAGNOSIS — R058 Other specified cough: Secondary | ICD-10-CM | POA: Diagnosis not present

## 2023-10-29 DIAGNOSIS — Z923 Personal history of irradiation: Secondary | ICD-10-CM | POA: Diagnosis not present

## 2023-10-29 DIAGNOSIS — E1122 Type 2 diabetes mellitus with diabetic chronic kidney disease: Secondary | ICD-10-CM | POA: Diagnosis not present

## 2023-10-29 DIAGNOSIS — Z825 Family history of asthma and other chronic lower respiratory diseases: Secondary | ICD-10-CM | POA: Diagnosis not present

## 2023-10-29 DIAGNOSIS — Z5941 Food insecurity: Secondary | ICD-10-CM | POA: Diagnosis not present

## 2023-10-29 NOTE — Progress Notes (Signed)
 [facility] CONSULT NOTE  Patient Care Team: [name], MD as PCP - General (Family Medicine) Ladona Heinz, MD as PCP - Cardiology (Cardiology) Cindie Ole DASEN, MD as PCP - Electrophysiology (Cardiology) Ishmael Slough, MD as Consulting Physician (Rheumatology) Tyree Nanetta SAILOR, RN as Oncology Nurse Navigator Loretha Ash, MD as Consulting Physician (Hematology and Oncology) [name], MD as Consulting Physician (Radiation Oncology)  CHIEF COMPLAINTS/PURPOSE OF CONSULTATION:  Newly diagnosed breast cancer  HISTORY OF PRESENTING ILLNESS:  [name] 83 y.o. female is here because of recent diagnosis of left breast IDC  I reviewed her records extensively and collaborated the history with the patient.  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 1.1 cm probable cystic appearing mass lies slightly above and lateral to the lumpectomy bed calcification. Aspiration to confirm this as a benign cyst is recommended.   01/08/2023 Breast US    Ultrasound-guided cyst aspiration of the 1.1 cm presumed complicated cyst in the left breast at 10 o'clock, 2 cm the nipple. If this does not aspirate, ultrasound-guided core needle biopsy would be indicated.     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 VUS identified in BRCA2 p.E1605K (c.4813G>A) and ALK p.T656I (c.1967C>T). The CancerNext-Expanded gene panel offered by [facility] and includes sequencing, rearrangement, and RNA analysis for the following 76 genes: AIP, ALK, APC, ATM, AXIN2, BAP1, BARD1, BMPR1A, BRCA1, BRCA2, BRIP1, CDC73, CDH1, CDK4, CDKN1B, CDKN2A, CEBPA, CHEK2, CTNNA1, DDX41, DICER1, ETV6, FH, FLCN, GATA2, LZTR1, MAX, MBD4, MEN1, MET, MLH1, MSH2, MSH3, MSH6, MUTYH, NF1, NF2, NTHL1, PALB2, PHOX2B, PMS2, POT1, PRKAR1A, PTCH1, PTEN, RAD51C, RAD51D, RB1, RET, RUNX1, SDHA, SDHAF2, SDHB, SDHC, SDHD, SMAD4, SMARCA4, SMARCB1, SMARCE1, STK11, SUFU, TMEM127, TP53, TSC1, TSC2, VHL, and WT1 (sequencing and deletion/duplication); EGFR, HOXB13, KIT, MITF, PDGFRA, POLD1, and POLE (sequencing only); EPCAM and GREM1 (deletion/duplication only). Report date 03/08/23.    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)   07/22/2023 Cancer Staging   Staging form: Lung, AJCC V9 - Clinical: Stage IIIB (cT2, cN2b(f), cM0) - Signed by Loretha Ash, MD on 07/22/2023 Method of lymph node assessment: Fine needle aspiration   08/09/2023 - 10/01/2023 Radiation Therapy   Plan Name: Lung_L Site: Lung, Left Technique: 3D Mode: Photon Dose Per Fraction: 2 Gy Prescribed Dose (Delivered / Prescribed): 60 Gy / 60 Gy Prescribed Fxs (Delivered / Prescribed): 30 / 30   Plan Name: Lung_L_Bst Site: Lung, Left Technique: 3D Mode: Photon Dose Per Fraction: 2 Gy Prescribed Dose (Delivered / Prescribed): 6 Gy / 6 Gy Prescribed Fxs (Delivered / Prescribed): 3 / 3     History of  Present Illness    Discussed the use of AI scribe software for clinical note transcription with the patient, who  gave verbal consent to proceed.  History of Present Illness  [name] is an 83 year old female with lung and breast cancer who presents for follow-up after radiation therapy. She is accompanied by her daughter.  She recently completed radiation therapy for lung cancer, experiencing significant side effects such as esophagitis, which led to difficulty eating and drinking, resulting in weakness and malnutrition. This necessitated emergency room visits and intravenous fluid administration during her last three radiation treatments to manage dehydration.  She has a history of breast cancer and is currently taking anastrozole  without any reported issues. She underwent a mastectomy on the right side and is due for a mammogram on the remaining left breast in December.  She has experienced weight loss and has been using a wheelchair due to weakness. Her appetite is slowly returning, and she is gradually regaining strength.   MEDICAL HISTORY:  Past Medical History:  Diagnosis Date   Acute bronchitis 12/17/2021   Acute cystitis 02/20/2019   Arthritis    Atrial flutter (HCC)    Breast cancer (HCC)    left  2009 and 2025   Diabetes mellitus without complication (HCC)    Dyspnea    mostly with exertion   GERD (gastroesophageal reflux disease)    Hypertension    Hypothyroidism    Libman Sacks endocarditis (HCC)    Lupus    Pneumonia 07/31/2021    SURGICAL HISTORY: Past Surgical History:  Procedure Laterality Date   BREAST BIOPSY Left 01/15/2023   US  LT BREAST BX W LOC DEV 1ST LESION IMG BX SPEC US  GUIDE 01/15/2023 [facility]MO[facility]   BREAST LUMPECTOMY Left 2009   BRONCHIAL BIOPSY  06/29/2023   Procedure: BRONCHOSCOPY, WITH BIOPSY;  Surgeon: [name], MD;  Location: [facility] ENDOSCOPY;  Service: Pulmonary;;   BRONCHIAL BRUSHINGS  06/29/2023   Procedure: BRONCHOSCOPY, WITH BRUSH BIOPSY;  Surgeon: [name], MD;  Location: [facility];  Service: Pulmonary;;   BRONCHIAL NEEDLE  ASPIRATION BIOPSY  06/29/2023   Procedure: BRONCHOSCOPY, WITH NEEDLE ASPIRATION BIOPSY;  Surgeon: [name], MD;  Location: [facility];  Service: Pulmonary;;   IR 3D INDEPENDENT WKST  05/29/2021   IR ANGIO INTRA EXTRACRAN SEL COM CAROTID INNOMINATE BILAT MOD SED  05/29/2021   IR ANGIO VERTEBRAL SEL VERTEBRAL BILAT MOD SED  05/29/2021   SIMPLE MASTECTOMY WITH AXILLARY SENTINEL NODE BIOPSY Left 03/31/2023   Procedure: LEFT MASTECTOMY;  Surgeon: Vernetta Berg, MD;  Location: [facility];  Service: General;  Laterality: Left;  LMA PEC BLOCK   VIDEO BRONCHOSCOPY WITH ENDOBRONCHIAL NAVIGATION Left 06/29/2023   Procedure: VIDEO BRONCHOSCOPY WITH ENDOBRONCHIAL NAVIGATION;  Surgeon: [name], MD;  Location: [facility]OS[facility];  Service: Pulmonary;  Laterality: Left;   VIDEO BRONCHOSCOPY WITH ENDOBRONCHIAL ULTRASOUND Bilateral 06/29/2023   Procedure: BRONCHOSCOPY, WITH EBUS;  Surgeon: [name], MD;  Location: [facility] ENDOSCOPY;  Service: Pulmonary;  Laterality: Bilateral;    SOCIAL HISTORY: Social History   Socioeconomic History   Marital status: Single    Spouse name: Not on file   Number of children: 2   Years of education: Not on file   Highest education level: Not on file  Occupational History   Not on file  Tobacco Use   Smoking status: Never    Passive exposure: Never   Smokeless tobacco: Not on file  Vaping Use   Vaping status:  Never Used  Substance and Sexual Activity   Alcohol  use: No   Drug use: No   Sexual activity: Not Currently  Other Topics Concern   Not on file  Social History Narrative   Not on file   Social Drivers of Health   Financial Resource Strain: Not on file  Food Insecurity: Food Insecurity Present (07/22/2023)   Hunger Vital Sign    Worried About Running Out of Food in the Last Year: Sometimes true    Ran Out of Food in the Last Year: Sometimes true  Transportation Needs: No Transportation Needs (07/22/2023)   PRAPARE - Scientist, research (physical sciences) (Medical): No    Lack of Transportation (Non-Medical): No  Physical Activity: Not on file  Stress: Not on file  Social Connections: Unknown (06/19/2023)   Social Connection and Isolation Panel    Frequency of Communication with Friends and Family: More than three times a week    Frequency of Social Gatherings with Friends and Family: More than three times a week    Attends Religious Services: More than 4 times per year    Active Member of [employer] or Organizations: No    Attends Banker Meetings: 1 to 4 times per year    Marital Status: Patient declined  Intimate Partner Violence: Not At Risk (07/22/2023)   Humiliation, Afraid, Rape, and Kick questionnaire    Fear of Current or Ex-Partner: No    Emotionally Abused: No    Physically Abused: No    Sexually Abused: No    FAMILY HISTORY: Family History  Problem Relation Age of Onset   Cancer Father        prostate   Prostate cancer Father 76 - 71       metaststic   Asthma Brother    Cancer Daughter        breast   Breast cancer Daughter 73    ALLERGIES:  is allergic to ciprofloxacin, crestor  [rosuvastatin ], lisinopril , and ofev  [nintedanib].  MEDICATIONS:  Current Outpatient Medications  Medication Sig Dispense Refill   Accu-Chek Softclix Lancets lancets For use when checking blood sugars ICD-10: E11.69 Once a day alternating mornings and evenings before meals; Duration: 100 days     Accu-Chek Softclix Lancets lancets For use when checking blood sugars ICD-10: E11.69 Once a day alternating mornings and evenings before meals; Duration: 100 days     albuterol  (ACCUNEB ) 0.63 MG/3ML nebulizer solution Take 3 mLs (0.63 mg total) by nebulization every 6 (six) hours as needed for wheezing. 75 mL 12   albuterol  (VENTOLIN  HFA) 108 (90 Base) MCG/ACT inhaler Inhale 2 puffs into the lungs every 6 (six) hours as needed for wheezing or shortness of breath. 8.5 g 2   anastrozole  (ARIMIDEX ) 1 MG tablet Take 1 tablet (1 mg  total) by mouth daily. 90 tablet 3   apixaban  (ELIQUIS ) 5 MG TABS tablet Take 1 tablet (5 mg total) by mouth 2 (two) times daily. Okay to restart on 06/30/2023     atorvastatin  (LIPITOR) 20 MG tablet Take 20 mg by mouth daily.     benzonatate  (TESSALON ) 100 MG capsule Take 1 capsule (100 mg total) by mouth 3 (three) times daily as needed for cough. 21 capsule 0   Blood Glucose Monitoring Suppl (ACCU-CHEK GUIDE ME) w/Device KIT For use when checking blood sugars as directed DX: E11.69     diclofenac  Sodium (VOLTAREN ) 1 % GEL Apply 1 application. topically 4 (four) times daily as needed (pain).  diltiazem  (CARDIZEM  CD) 120 MG 24 hr capsule Take 1 capsule (120 mg total) by mouth daily. 90 capsule 3   fluticasone  (FLONASE  ALLERGY RELIEF) 50 MCG/ACT nasal spray Place 1 spray into both nostrils daily.     furosemide (LASIX) 20 MG tablet Take 20 mg by mouth daily.     gabapentin  (NEURONTIN ) 300 MG capsule Take 300 mg by mouth 3 (three) times daily.     glimepiride  (AMARYL ) 1 MG tablet Take 1 mg by mouth daily with breakfast.     HYDROcodone -acetaminophen  (HYCET) 7.5-325 mg/15 ml solution Take 10 mLs by mouth 4 (four) times daily as needed for moderate pain (pain score 4-6). 118 mL 0   hydroxychloroquine  (PLAQUENIL ) 200 MG tablet Take 200 mg by mouth daily.     Ipratropium-Albuterol  (COMBIVENT RESPIMAT) 20-100 MCG/ACT AERS respimat Inhale 1 puff into the lungs every 6 (six) hours as needed.     levothyroxine  (SYNTHROID ) 100 MCG tablet Take 100 mcg by mouth daily.     lidocaine  (XYLOCAINE ) 2 % solution Use as directed 15 mLs in the mouth or throat as needed. Swallow 15 min prior to meals 250 mL 0   losartan  (COZAAR ) 50 MG tablet Take 50 mg by mouth daily.     losartan -hydrochlorothiazide  (HYZAAR) 50-12.5 MG tablet Take 1 tablet by mouth daily.     nystatin  (MYCOSTATIN ) 100000 UNIT/ML suspension Take 5 mLs (500,000 Units total) by mouth 4 (four) times daily. 60 mL 0   omeprazole (PRILOSEC) 20 MG capsule  Take 20 mg by mouth daily as needed (Indigestion).     pantoprazole  (PROTONIX ) 20 MG tablet Take 1 tablet (20 mg total) by mouth daily. 30 tablet 0   predniSONE  (DELTASONE ) 10 MG tablet Please take prednisone  40 mg x1 day, then 30 mg x1 day, then 20 mg x1 day, then 10 mg x1 day, and then 5 mg x1 day and stop 11 tablet 0   promethazine -dextromethorphan  (PROMETHAZINE -DM) 6.25-15 MG/5ML syrup Take 5 mLs by mouth 4 (four) times daily as needed for cough. 118 mL 0   Rivaroxaban  (XARELTO ) 15 MG TABS tablet Take 15 mg by mouth daily with supper.     spironolactone  (ALDACTONE ) 25 MG tablet Take 0.5 tablets (12.5 mg total) by mouth daily. 45 tablet 3   sucralfate  (CARAFATE ) 1 g tablet Take 1 tablet (1 g total) by mouth 4 (four) times daily -  with meals and at bedtime. Crush and dissolve in 10 mL's of warm water prior to swallowing, take 30 min prior to meals 120 tablet 1   Tiotropium Bromide  Monohydrate (SPIRIVA  RESPIMAT) 1.25 MCG/ACT AERS 2 puff(s) inhaled once a day; Duration: 30 day(s)     No current facility-administered medications for this visit.     PHYSICAL EXAMINATION: ECOG PERFORMANCE STATUS: 1 - Symptomatic but completely ambulatory  There were no vitals filed for this visit.   There were no vitals filed for this visit.  GENERAL:alert, no distress and comfortable No cervical adenopathy. CTA bilaterally RRR No LE edema.  LABORATORY DATA:  I have reviewed the data as listed Lab Results  Component Value Date   WBC 4.3 09/28/2023   HGB 11.5 (L) 09/28/2023   HCT 36.8 09/28/2023   MCV 88.0 09/28/2023   PLT 260 09/28/2023   Lab Results  Component Value Date   NA 138 09/28/2023   K 4.0 09/28/2023   CL 103 09/28/2023   CO2 24 09/28/2023    RADIOGRAPHIC STUDIES: I have personally reviewed the radiological reports and agreed with  the findings in the report.  ASSESSMENT AND PLAN:  No problem-specific Assessment & Plan notes found for this encounter.  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 Not a candidate for concurrent chemoradiation or immunotherapy alone based on TMB status. Discussed sequential chemotherapy and radiation, acknowledging radiation alone may not be curative. - No actionable mutations, PDL1 TPS score is 0. - She underwent definitive radiation alone.  - Continue to follow up with periodic imaging. - Surveillance with follow-up scans in a few months to assess radiation therapy effectiveness.  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 Symptoms improving with gradual return of appetite and strength.  Invasive ductal carcinoma of right breast, post-mastectomy, on anastrozole  Post-mastectomy status for invasive ductal carcinoma of the right breast. Currently on anastrozole  with no reported side effects. - Continue anastrozole  therapy. - Order mammogram for the remaining left breast in December.  Time spent: 30 min reviewing records, face to face with pt, counseling, coordination of care. All questions were answered. The patient knows to call the clinic with any problems, questions or concerns.    Amber Stalls, MD 10/29/23

## 2023-10-30 ENCOUNTER — Other Ambulatory Visit: Payer: Self-pay

## 2023-10-30 ENCOUNTER — Emergency Department (HOSPITAL_COMMUNITY)

## 2023-10-30 ENCOUNTER — Inpatient Hospital Stay (HOSPITAL_COMMUNITY)
Admission: EM | Admit: 2023-10-30 | Discharge: 2023-11-02 | DRG: 281 | Disposition: A | Discharge status: Home / Self Care | Attending: Internal Medicine | Admitting: Internal Medicine

## 2023-10-30 ENCOUNTER — Encounter (HOSPITAL_COMMUNITY): Payer: Self-pay | Admitting: *Deleted

## 2023-10-30 DIAGNOSIS — Z923 Personal history of irradiation: Secondary | ICD-10-CM | POA: Diagnosis not present

## 2023-10-30 DIAGNOSIS — I480 Paroxysmal atrial fibrillation: Secondary | ICD-10-CM | POA: Diagnosis present

## 2023-10-30 DIAGNOSIS — Z825 Family history of asthma and other chronic lower respiratory diseases: Secondary | ICD-10-CM

## 2023-10-30 DIAGNOSIS — E039 Hypothyroidism, unspecified: Secondary | ICD-10-CM | POA: Diagnosis present

## 2023-10-30 DIAGNOSIS — Z5941 Food insecurity: Secondary | ICD-10-CM

## 2023-10-30 DIAGNOSIS — Z79890 Hormone replacement therapy (postmenopausal): Secondary | ICD-10-CM | POA: Diagnosis not present

## 2023-10-30 DIAGNOSIS — Z9012 Acquired absence of left breast and nipple: Secondary | ICD-10-CM

## 2023-10-30 DIAGNOSIS — I4892 Unspecified atrial flutter: Secondary | ICD-10-CM

## 2023-10-30 DIAGNOSIS — I129 Hypertensive chronic kidney disease with stage 1 through stage 4 chronic kidney disease, or unspecified chronic kidney disease: Secondary | ICD-10-CM | POA: Diagnosis present

## 2023-10-30 DIAGNOSIS — I272 Pulmonary hypertension, unspecified: Secondary | ICD-10-CM | POA: Diagnosis present

## 2023-10-30 DIAGNOSIS — Z8701 Personal history of pneumonia (recurrent): Secondary | ICD-10-CM | POA: Diagnosis not present

## 2023-10-30 DIAGNOSIS — Z79899 Other long term (current) drug therapy: Secondary | ICD-10-CM | POA: Diagnosis not present

## 2023-10-30 DIAGNOSIS — I214 Non-ST elevation (NSTEMI) myocardial infarction: Secondary | ICD-10-CM | POA: Diagnosis not present

## 2023-10-30 DIAGNOSIS — Z888 Allergy status to other drugs, medicaments and biological substances status: Secondary | ICD-10-CM

## 2023-10-30 DIAGNOSIS — R7989 Other specified abnormal findings of blood chemistry: Secondary | ICD-10-CM | POA: Diagnosis not present

## 2023-10-30 DIAGNOSIS — E785 Hyperlipidemia, unspecified: Secondary | ICD-10-CM | POA: Diagnosis present

## 2023-10-30 DIAGNOSIS — K219 Gastro-esophageal reflux disease without esophagitis: Secondary | ICD-10-CM | POA: Diagnosis present

## 2023-10-30 DIAGNOSIS — N1831 Chronic kidney disease, stage 3a: Secondary | ICD-10-CM | POA: Diagnosis present

## 2023-10-30 DIAGNOSIS — M1990 Unspecified osteoarthritis, unspecified site: Secondary | ICD-10-CM | POA: Diagnosis present

## 2023-10-30 DIAGNOSIS — I2510 Atherosclerotic heart disease of native coronary artery without angina pectoris: Secondary | ICD-10-CM | POA: Diagnosis present

## 2023-10-30 DIAGNOSIS — M329 Systemic lupus erythematosus, unspecified: Secondary | ICD-10-CM | POA: Diagnosis present

## 2023-10-30 DIAGNOSIS — Z5948 Other specified lack of adequate food: Secondary | ICD-10-CM

## 2023-10-30 DIAGNOSIS — Z7901 Long term (current) use of anticoagulants: Secondary | ICD-10-CM | POA: Diagnosis not present

## 2023-10-30 DIAGNOSIS — Z853 Personal history of malignant neoplasm of breast: Secondary | ICD-10-CM

## 2023-10-30 DIAGNOSIS — C3432 Malignant neoplasm of lower lobe, left bronchus or lung: Secondary | ICD-10-CM | POA: Diagnosis present

## 2023-10-30 DIAGNOSIS — R058 Other specified cough: Secondary | ICD-10-CM | POA: Diagnosis not present

## 2023-10-30 DIAGNOSIS — Z7984 Long term (current) use of oral hypoglycemic drugs: Secondary | ICD-10-CM | POA: Diagnosis not present

## 2023-10-30 DIAGNOSIS — Z881 Allergy status to other antibiotic agents status: Secondary | ICD-10-CM

## 2023-10-30 DIAGNOSIS — E1122 Type 2 diabetes mellitus with diabetic chronic kidney disease: Secondary | ICD-10-CM | POA: Diagnosis present

## 2023-10-30 DIAGNOSIS — J849 Interstitial pulmonary disease, unspecified: Secondary | ICD-10-CM | POA: Diagnosis present

## 2023-10-30 DIAGNOSIS — M25512 Pain in left shoulder: Secondary | ICD-10-CM | POA: Diagnosis present

## 2023-10-30 DIAGNOSIS — Z79811 Long term (current) use of aromatase inhibitors: Secondary | ICD-10-CM

## 2023-10-30 LAB — URINALYSIS, ROUTINE W REFLEX MICROSCOPIC
Bacteria, UA: NONE SEEN
Bilirubin Urine: NEGATIVE
Glucose, UA: NEGATIVE mg/dL
Hgb urine dipstick: NEGATIVE
Ketones, ur: NEGATIVE mg/dL
Nitrite: NEGATIVE
Protein, ur: NEGATIVE mg/dL
Specific Gravity, Urine: 1.005 (ref 1.005–1.030)
pH: 7 (ref 5.0–8.0)

## 2023-10-30 LAB — CBC
HCT: 35.5 % — ABNORMAL LOW (ref 36.0–46.0)
Hemoglobin: 11.7 g/dL — ABNORMAL LOW (ref 12.0–15.0)
MCH: 28.8 pg (ref 26.0–34.0)
MCHC: 33 g/dL (ref 30.0–36.0)
MCV: 87.4 fL (ref 80.0–100.0)
Platelets: 162 K/uL (ref 150–400)
RBC: 4.06 MIL/uL (ref 3.87–5.11)
RDW: 16.9 % — ABNORMAL HIGH (ref 11.5–15.5)
WBC: 8 K/uL (ref 4.0–10.5)
nRBC: 0 % (ref 0.0–0.2)

## 2023-10-30 LAB — BASIC METABOLIC PANEL WITH GFR
Anion gap: 10 (ref 5–15)
BUN: 14 mg/dL (ref 8–23)
CO2: 22 mmol/L (ref 22–32)
Calcium: 9 mg/dL (ref 8.9–10.3)
Chloride: 99 mmol/L (ref 98–111)
Creatinine, Ser: 0.97 mg/dL (ref 0.44–1.00)
GFR, Estimated: 59 mL/min — ABNORMAL LOW (ref >60)
Glucose, Bld: 123 mg/dL — ABNORMAL HIGH (ref 70–99)
Potassium: 4.7 mmol/L (ref 3.5–5.1)
Sodium: 131 mmol/L — ABNORMAL LOW (ref 135–145)

## 2023-10-30 LAB — APTT: aPTT: 71 s — ABNORMAL HIGH (ref 24–36)

## 2023-10-30 LAB — TROPONIN I (HIGH SENSITIVITY)
Troponin I (High Sensitivity): 1158 ng/L (ref <18)
Troponin I (High Sensitivity): 1244 ng/L (ref <18)

## 2023-10-30 LAB — HEPARIN LEVEL (UNFRACTIONATED): Heparin Unfractionated: >1.1 [IU]/mL — ABNORMAL HIGH (ref 0.30–0.70)

## 2023-10-30 MED ORDER — HEPARIN (PORCINE) 25000 UT/250ML-% IV SOLN
700.0000 [IU]/h | INTRAVENOUS | Status: DC
  Administered 2023-10-30 – 2023-10-31 (×2): 750 [IU]/h via INTRAVENOUS
  Filled 2023-10-30 (×2): qty 250

## 2023-10-30 MED ORDER — HYDROXYCHLOROQUINE SULFATE 200 MG PO TABS
200.0000 mg | ORAL_TABLET | Freq: Every day | ORAL | Status: DC
  Administered 2023-10-31 – 2023-11-02 (×3): 200 mg via ORAL
  Filled 2023-10-30 (×3): qty 1

## 2023-10-30 MED ORDER — PANTOPRAZOLE SODIUM 20 MG PO TBEC
20.0000 mg | DELAYED_RELEASE_TABLET | Freq: Every day | ORAL | Status: DC
  Administered 2023-10-31 – 2023-11-02 (×3): 20 mg via ORAL
  Filled 2023-10-30 (×3): qty 1

## 2023-10-30 MED ORDER — ASPIRIN 325 MG PO TABS
325.0000 mg | ORAL_TABLET | Freq: Every day | ORAL | Status: DC
  Administered 2023-10-30 – 2023-10-31 (×2): 325 mg via ORAL
  Filled 2023-10-30 (×2): qty 1

## 2023-10-30 MED ORDER — GABAPENTIN 300 MG PO CAPS
300.0000 mg | ORAL_CAPSULE | Freq: Three times a day (TID) | ORAL | Status: DC
  Administered 2023-10-30 – 2023-11-02 (×9): 300 mg via ORAL
  Filled 2023-10-30 (×9): qty 1

## 2023-10-30 MED ORDER — LEVOTHYROXINE SODIUM 100 MCG PO TABS
100.0000 ug | ORAL_TABLET | Freq: Every day | ORAL | Status: DC
  Administered 2023-10-31 – 2023-11-02 (×3): 100 ug via ORAL
  Filled 2023-10-30 (×3): qty 1

## 2023-10-30 MED ORDER — DILTIAZEM HCL ER COATED BEADS 120 MG PO CP24
120.0000 mg | ORAL_CAPSULE | Freq: Every day | ORAL | Status: DC
  Administered 2023-10-30 – 2023-11-02 (×3): 120 mg via ORAL
  Filled 2023-10-30 (×5): qty 1

## 2023-10-30 MED ORDER — POLYETHYLENE GLYCOL 3350 17 G PO PACK
17.0000 g | PACK | Freq: Two times a day (BID) | ORAL | Status: DC
  Administered 2023-10-30 – 2023-11-01 (×3): 17 g via ORAL
  Filled 2023-10-30 (×4): qty 1

## 2023-10-30 MED ORDER — IPRATROPIUM-ALBUTEROL 0.5-2.5 (3) MG/3ML IN SOLN: 3.0000 mL | Freq: Four times a day (QID) | RESPIRATORY_TRACT | Status: DC | PRN

## 2023-10-30 MED ORDER — SENNOSIDES-DOCUSATE SODIUM 8.6-50 MG PO TABS
2.0000 | ORAL_TABLET | Freq: Two times a day (BID) | ORAL | Status: DC
  Administered 2023-10-31 – 2023-11-01 (×3): 2 via ORAL
  Filled 2023-10-30 (×4): qty 2

## 2023-10-30 NOTE — Progress Notes (Signed)
 PHARMACY - ANTICOAGULATION CONSULT NOTE  Pharmacy Consult for heparin  Indication: chest pain/ACS  Allergies  Allergen Reactions   Ciprofloxacin Itching, Swelling and Other (See Comments)    Possibly causing tremors?   Crestor  [Rosuvastatin ] Other (See Comments)    Myalgia and back pain   Lisinopril  Cough   Ofev  [Nintedanib] Nausea Only    Patient Measurements: Height: 5' 7 (170.2 cm) Weight: 56.2 kg (124 lb) IBW/kg (Calculated) : 61.6 HEPARIN  DW (KG): 56.2  Vital Signs: Temp: 97.9 F (36.6 C) (10/04 1118) Temp Source: Oral (10/04 1118) BP: 153/80 (10/04 1200) Pulse Rate: 67 (10/04 1200)  Labs: Recent Labs    10/30/23 1050 10/30/23 1250  HGB 11.7*  --   HCT 35.5*  --   PLT 162  --   CREATININE 0.97  --   TROPONINIHS 1,158* 1,244*    Estimated Creatinine Clearance: 40.4 mL/min (by C-G formula based on SCr of 0.97 mg/dL).   Medical History: Past Medical History:  Diagnosis Date   Acute bronchitis 12/17/2021   Acute cystitis 02/20/2019   Arthritis    Atrial flutter (HCC)    Breast cancer (HCC)    left  2009 and 2025   Diabetes mellitus without complication (HCC)    Dyspnea    mostly with exertion   GERD (gastroesophageal reflux disease)    Hypertension    Hypothyroidism    Libman Sacks endocarditis (HCC)    Lupus    Pneumonia 07/31/2021     Assessment: 83 YOF presenting with CP, elevated troponin, hx of afib on Eliquis  with last dose taken 10/4 AM  Goal of Therapy:  Heparin  level 0.3-0.7 units/ml aPTT 66-102 seconds Monitor platelets by anticoagulation protocol: Yes   Plan:  Heparin  gtt at 750 units/hr, no bolus F/u 8 hour aPTT/HL F/u cards eval and recs  Dorn Poot, PharmD, [facility] Clinical Pharmacist ED Pharmacist Phone # [phone] 10/30/2023 2:18 PM

## 2023-10-30 NOTE — ED Triage Notes (Signed)
 Patient c/o left shoulder pain onset this am 0830 states the pain radiates into the left  side of her neck hx. Left breast ca with mastectomy states the pain lasted 30 mins then returned. Denies injury

## 2023-10-30 NOTE — H&P (Signed)
 History and Physical    Patient: [name] FMW:[id number] DOB: [birth year] DOA: 10/30/2023 DOS: the patient was seen and examined on 10/30/2023 PCP: [name], MD  Patient coming from: Home  Chief Complaint:  Chief Complaint  Patient presents with   Shoulder Pain   HPI: [name] is a 82 y.o. 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  The patient presented with a complaint of terrible pain in the left arm. The pain began while the patient was in bed, reaching for the phone to turn off an alarm at 8:30 AM. The pain was severe, initially lasting for about 30 minutes before subsiding. However, it soon recurred and was described as more intense than the first episode. The patient attempted to call for help from their daughter, but it was the grandson who responded and informed the daughter. The daughter then called for emergency medical services. The pain persisted until after the patient arrived at the hospital. The patient reported that no medication was given during the ambulance ride nor taken at home. The patient noted that this was the first occurrence of such pain. The pain did not radiate beyond the left arm except for extending to the back of the neck. The patient did not experience any chest pain.  In the ED, pt hypertensive and tachypneic w/o hypoxia. Labs notable for troponin 1158-->1244. EKG w/ NSR and no obvious ischemia. EDP consulted cardiology who recommended starting hep gtt and requested medicine admission.   Review of Systems: As mentioned in the history of present illness. All other systems reviewed and are negative. Past Medical History:  Diagnosis Date   Acute bronchitis 12/17/2021   Acute cystitis 02/20/2019   Arthritis    Atrial flutter  (HCC)    Breast cancer (HCC)    left  2009 and 2025   Diabetes mellitus without complication (HCC)    Dyspnea    mostly with exertion   GERD (gastroesophageal reflux disease)    Hypertension    Hypothyroidism    Libman Sacks endocarditis (HCC)    Lupus    Pneumonia 07/31/2021   Past Surgical History:  Procedure Laterality Date   BREAST BIOPSY Left 01/15/2023   US  LT BREAST BX W LOC DEV 1ST LESION IMG BX SPEC US  GUIDE 01/15/2023 [facility]   BREAST LUMPECTOMY Left 2009   BRONCHIAL BIOPSY  06/29/2023   Procedure: BRONCHOSCOPY, WITH BIOPSY;  Surgeon: [name], MD;  Location: [facility];  Service: Pulmonary;;   BRONCHIAL BRUSHINGS  06/29/2023   Procedure: BRONCHOSCOPY, WITH BRUSH BIOPSY;  Surgeon: [name], MD;  Location: [facility];  Service: Pulmonary;;   BRONCHIAL NEEDLE ASPIRATION BIOPSY  06/29/2023   Procedure: BRONCHOSCOPY, WITH NEEDLE ASPIRATION BIOPSY;  Surgeon: [name], MD;  Location: [facility];  Service: Pulmonary;;   IR 3D INDEPENDENT WKST  05/29/2021   IR ANGIO INTRA EXTRACRAN SEL COM CAROTID INNOMINATE BILAT MOD SED  05/29/2021   IR ANGIO VERTEBRAL SEL VERTEBRAL BILAT MOD SED  05/29/2021   SIMPLE MASTECTOMY WITH AXILLARY SENTINEL NODE BIOPSY Left 03/31/2023   Procedure: LEFT MASTECTOMY;  Surgeon: Vernetta Berg, MD;  Location: [facility] OR;  Service: General;  Laterality: Left;  LMA PEC BLOCK   VIDEO BRONCHOSCOPY WITH ENDOBRONCHIAL NAVIGATION Left 06/29/2023   Procedure: VIDEO BRONCHOSCOPY WITH ENDOBRONCHIAL  NAVIGATION;  Surgeon: [name], MD;  Location: [facility] ENDOSCOPY;  Service: Pulmonary;  Laterality: Left;   VIDEO BRONCHOSCOPY WITH ENDOBRONCHIAL ULTRASOUND Bilateral 06/29/2023   Procedure: BRONCHOSCOPY, WITH EBUS;  Surgeon: [name], MD;  Location: [facility] ENDOSCOPY;  Service: Pulmonary;  Laterality: Bilateral;   Social History:  reports that she has never smoked. She has never been exposed to tobacco smoke. She does not have any smokeless  tobacco history on file. She reports that she does not drink alcohol  and does not use drugs.  Allergies  Allergen Reactions   Ciprofloxacin Itching, Swelling and Other (See Comments)    Possibly causing tremors?   Crestor  [Rosuvastatin ] Other (See Comments)    Myalgia and back pain   Lisinopril  Cough   Ofev  [Nintedanib] Nausea Only    Family History  Problem Relation Age of Onset   Cancer Father        prostate   Prostate cancer Father 50 - 98       metaststic   Asthma Brother    Cancer Daughter        breast   Breast cancer Daughter 37    Prior to Admission medications   Medication Sig Start Date End Date Taking? Authorizing Provider  albuterol  (ACCUNEB ) 0.63 MG/3ML nebulizer solution Take 3 mLs (0.63 mg total) by nebulization every 6 (six) hours as needed for wheezing. 07/09/23  Yes Ruthell Lauraine FALCON, NP  anastrozole  (ARIMIDEX ) 1 MG tablet Take 1 tablet (1 mg total) by mouth daily. 04/08/23  Yes [name], [name], MD  apixaban  (ELIQUIS ) 5 MG TABS tablet Take 1 tablet (5 mg total) by mouth 2 (two) times daily. Okay to restart on 06/30/2023 06/29/23  Yes [name], MD  diclofenac  Sodium (VOLTAREN ) 1 % GEL Apply 1 application. topically 4 (four) times daily as needed (pain).   Yes [provider]  Accu-Chek Softclix Lancets lancets For use when checking blood sugars ICD-10: E11.69 Once a day alternating mornings and evenings before meals; Duration: 100 days 10/27/22   [provider]  Accu-Chek Softclix Lancets lancets For use when checking blood sugars ICD-10: E11.69 Once a day alternating mornings and evenings before meals; Duration: 100 days 10/27/22   [provider]  albuterol  (VENTOLIN  HFA) 108 (90 Base) MCG/ACT inhaler Inhale 2 puffs into the lungs every 6 (six) hours as needed for wheezing or shortness of breath. Patient not taking: Reported on 10/30/2023 07/09/23   Ruthell Lauraine FALCON, NP  benzonatate  (TESSALON ) 100 MG capsule Take 1 capsule (100 mg total) by  mouth 3 (three) times daily as needed for cough. Patient not taking: Reported on 10/30/2023 10/19/23   Vonna Sharlet POUR, MD  Blood Glucose Monitoring Suppl (ACCU-CHEK GUIDE ME) w/Device KIT For use when checking blood sugars as directed DX: E11.69 10/27/22   [provider]  diltiazem  (CARDIZEM  CD) 120 MG 24 hr capsule Take 1 capsule (120 mg total) by mouth daily. 01/07/23   Dunn, Dayna N, PA-C  furosemide (LASIX) 20 MG tablet Take 20 mg by mouth daily.    [provider]  gabapentin  (NEURONTIN ) 300 MG capsule Take 300 mg by mouth 3 (three) times daily.    [provider]  glimepiride  (AMARYL ) 1 MG tablet Take 1 mg by mouth daily with breakfast.    [provider]  HYDROcodone -acetaminophen  (HYCET) 7.5-325 mg/15 ml solution Take 10 mLs by mouth 4 (four) times daily as needed for moderate pain (pain score 4-6). 09/29/23 09/28/24  [name], MD  hydroxychloroquine  (  PLAQUENIL ) 200 MG tablet Take 200 mg by mouth daily.    [provider]  Ipratropium-Albuterol  (COMBIVENT RESPIMAT) 20-100 MCG/ACT AERS respimat Inhale 1 puff into the lungs every 6 (six) hours as needed. 11/16/17   [provider]  levothyroxine  (SYNTHROID ) 100 MCG tablet Take 100 mcg by mouth daily. 02/05/21   [provider]  losartan  (COZAAR ) 50 MG tablet Take 50 mg by mouth daily.    [provider]  losartan -hydrochlorothiazide  (HYZAAR) 50-12.5 MG tablet Take 1 tablet by mouth daily.    [provider]  nystatin  (MYCOSTATIN ) 100000 UNIT/ML suspension Take 5 mLs (500,000 Units total) by mouth 4 (four) times daily. 09/28/23   [name], Rocky SAILOR, PA-C  omeprazole (PRILOSEC) 20 MG capsule Take 20 mg by mouth daily as needed (Indigestion). 12/25/22   [provider]  pantoprazole  (PROTONIX ) 20 MG tablet Take 1 tablet (20 mg total) by mouth daily. 09/28/23   [name], Rocky SAILOR, PA-C  predniSONE  (DELTASONE ) 10 MG tablet Please take prednisone  40 mg x1 day, then 30 mg x1  day, then 20 mg x1 day, then 10 mg x1 day, and then 5 mg x1 day and stop 10/21/23   Geronimo Amel, MD  promethazine -dextromethorphan  (PROMETHAZINE -DM) 6.25-15 MG/5ML syrup Take 5 mLs by mouth 4 (four) times daily as needed for cough. 06/10/23   Reddick, Johnathan B, NP  Rivaroxaban  (XARELTO ) 15 MG TABS tablet Take 15 mg by mouth daily with supper. 02/28/19   [provider]  spironolactone  (ALDACTONE ) 25 MG tablet Take 0.5 tablets (12.5 mg total) by mouth daily. 01/07/23   Dunn, Dayna N, PA-C  sucralfate  (CARAFATE ) 1 g tablet Take 1 tablet (1 g total) by mouth 4 (four) times daily -  with meals and at bedtime. Crush and dissolve in 10 mL's of warm water prior to swallowing, take 30 min prior to meals 08/23/23   [name], MD  Tiotropium Bromide  Monohydrate (SPIRIVA  RESPIMAT) 1.25 MCG/ACT AERS 2 puff(s) inhaled once a day; Duration: 30 day(s) 08/18/19   [provider]    Physical Exam: Vitals:   10/30/23 1315 10/30/23 1330 10/30/23 1444 10/30/23 1444  BP: (!) 169/74 (!) 162/74 (!) 165/84   Pulse: 74 71 79   Resp: 20 17 (!) 21   Temp:    97.9 F (36.6 C)  TempSrc:    Oral  SpO2: 100% 100% 100%   Weight:      Height:       General: Alert, oriented x3, resting comfortably in no acute distress HEENT: EOMI, oropharynx clear, moist mucous membranes, hearing intact Neck: Trachea midline and no gross thyromegaly Respiratory: Lungs clear to auscultation bilaterally with normal respiratory effort; no w/r/r Cardiovascular: Regular rate and rhythm w/o m/r/g Abdomen: Soft, nontender, nondistended. Positive bowel sounds MSK: No obvious joint deformities or swelling Skin: No obvious rashes or lesions Neurologic: Awake, alert, spontaneously moves all extremities, strength intact Psychiatric: Appropriate mood and affect, conversational and cooperative   Data Reviewed:  Lab Results  Component Value Date   WBC 8.0 10/30/2023   HGB 11.7 (L) 10/30/2023   HCT 35.5 (L) 10/30/2023    MCV 87.4 10/30/2023   PLT 162 10/30/2023   Lab Results  Component Value Date   GLUCOSE 123 (H) 10/30/2023   CALCIUM  9.0 10/30/2023   NA 131 (L) 10/30/2023   K 4.7 10/30/2023   CO2 22 10/30/2023   CL 99 10/30/2023   BUN 14 10/30/2023   CREATININE 0.97 10/30/2023   Lab Results  Component Value  Date   ALT 13 09/28/2023   AST 26 09/28/2023   ALKPHOS 58 09/28/2023   BILITOT 0.6 09/28/2023   Lab Results  Component Value Date   INR 1.2 04/25/2022   INR 1.0 05/29/2021   Radiology: DG Chest 2 View Result Date: 10/30/2023 CLINICAL DATA:  Productive cough. EXAM: CHEST - 2 VIEW COMPARISON:  10/21/2023 FINDINGS: Lungs are hyperexpanded. Bibasilar chronic atelectasis or scarring is similar to prior. Superimposed lesion at the left base compatible with known hypermetabolic lesion seen on PET-CT 07/15/2023. No focal airspace consolidation, pneumothorax, or substantial pleural effusion. Cardiopericardial silhouette is at upper limits of normal for size. No acute bony abnormality. Telemetry leads overlie the chest. IMPRESSION: Hyperexpansion with bibasilar chronic atelectasis or scarring. No acute cardiopulmonary findings. Known hypermetabolic lesion left lower lobe better characterized on previous PET imaging. Electronically Signed   By: Camellia Candle M.D.   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  NSTEMI -Cards consulted; apprec eval/recs -Continue hep gtt  -PTA Cardizem  120mg  daily  HTN -PTA Cardizem  per above -HOLD pta lasix and losartan   Hypothyroid -PTA Synthroid  100mcg daily  SLE -PTA Plaquenil  200mg  daily  H/o stage IIIA (cT2a, N2, M0) adenocarcinoma, non-small cell lung cancer, of the LLL -HOLD pta anastrazole for now   Advance Care  Planning:   Code Status: Full Code   Consults: Cards  Family Communication: Daughter, [name]   Severity of Illness: The appropriate patient status for this patient is INPATIENT. Inpatient status is judged to be reasonable and necessary in order to provide the required intensity of service to ensure the patient's safety. The patient's presenting symptoms, physical exam findings, and initial radiographic and laboratory data in the context of their chronic comorbidities is felt to place them at high risk for further clinical deterioration. Furthermore, it is not anticipated that the patient will be medically stable for discharge from the hospital within 2 midnights of admission.   * I certify that at the point of admission it is my clinical judgment that the patient will require inpatient hospital care spanning beyond 2 midnights from the point of admission due to high intensity of service, high risk for further deterioration and high frequency of surveillance required.*   ------- I spent 60 minutes reviewing previous notes, at the bedside counseling/discussing the treatment plan, and performing clinical documentation.  Author: Marsha Ada, MD 10/30/2023 3:03 PM  For on call review [email or web address].

## 2023-10-30 NOTE — ED Provider Notes (Addendum)
 [place] [facility] AT [facility] Provider Note   CSN: [id number] Arrival date & time: 10/30/23  1004     Patient presents with: Shoulder Pain   [name] is a 83 y.o. female presenting to the ER with shoulder pain.  Patient reports that she woke up feeling okay and began having sharp pain in her left upper shoulder and left upper arm for about 30 minutes, began around 830.  The pain was worse with movement.  It seemed to go away, then came back again.  She denies that she has had this problems before.  She says the pain has gone away now again.  She denies any falls or injuries.  She does report she has an ongoing cough for about a month, mostly productive, that she cannot shake off.  Denies fevers or chills.  She is on oral chemotherapy for breast cancer.  She denies any known coronary disease but is on Eliquis  for atrial fibrillation.  She has a history of left-sided mastectomy due to breast cancer   HPI     Prior to Admission medications   Medication Sig Start Date End Date Taking? Authorizing Provider  albuterol  (ACCUNEB ) 0.63 MG/3ML nebulizer solution Take 3 mLs (0.63 mg total) by nebulization every 6 (six) hours as needed for wheezing. 07/09/23  Yes Ruthell Lauraine FALCON, NP  anastrozole  (ARIMIDEX ) 1 MG tablet Take 1 tablet (1 mg total) by mouth daily. 04/08/23  Yes [name], [name], MD  apixaban  (ELIQUIS ) 5 MG TABS tablet Take 1 tablet (5 mg total) by mouth 2 (two) times daily. Okay to restart on 06/30/2023 06/29/23  Yes [name], MD  diclofenac  Sodium (VOLTAREN ) 1 % GEL Apply 1 application. topically 4 (four) times daily as needed (pain).   Yes [provider]  Accu-Chek Softclix Lancets lancets For use when checking blood sugars ICD-10: E11.69 Once a day alternating mornings and evenings before meals; Duration: 100 days 10/27/22   [provider]  Accu-Chek Softclix Lancets lancets For use when checking blood sugars ICD-10: E11.69 Once a day  alternating mornings and evenings before meals; Duration: 100 days 10/27/22   [provider]  albuterol  (VENTOLIN  HFA) 108 (90 Base) MCG/ACT inhaler Inhale 2 puffs into the lungs every 6 (six) hours as needed for wheezing or shortness of breath. Patient not taking: Reported on 10/30/2023 07/09/23   Ruthell Lauraine FALCON, NP  benzonatate  (TESSALON ) 100 MG capsule Take 1 capsule (100 mg total) by mouth 3 (three) times daily as needed for cough. Patient not taking: Reported on 10/30/2023 10/19/23   Vonna Sharlet POUR, MD  Blood Glucose Monitoring Suppl (ACCU-CHEK GUIDE ME) w/Device KIT For use when checking blood sugars as directed DX: E11.69 10/27/22   [provider]  diltiazem  (CARDIZEM  CD) 120 MG 24 hr capsule Take 1 capsule (120 mg total) by mouth daily. 01/07/23   Dunn, Dayna N, PA-C  furosemide (LASIX) 20 MG tablet Take 20 mg by mouth daily.    [provider]  gabapentin  (NEURONTIN ) 300 MG capsule Take 300 mg by mouth 3 (three) times daily.    [provider]  glimepiride  (AMARYL ) 1 MG tablet Take 1 mg by mouth daily with breakfast.    [provider]  HYDROcodone -acetaminophen  (HYCET) 7.5-325 mg/15 ml solution Take 10 mLs by mouth 4 (four) times daily as needed for moderate pain (pain score 4-6). 09/29/23 09/28/24  [name], MD  hydroxychloroquine  (PLAQUENIL ) 200 MG tablet Take 200 mg by mouth daily.  [provider]  Ipratropium-Albuterol  (COMBIVENT RESPIMAT) 20-100 MCG/ACT AERS respimat Inhale 1 puff into the lungs every 6 (six) hours as needed. 11/16/17   [provider]  levothyroxine  (SYNTHROID ) 100 MCG tablet Take 100 mcg by mouth daily. 02/05/21   [provider]  losartan  (COZAAR ) 50 MG tablet Take 50 mg by mouth daily.    [provider]  losartan -hydrochlorothiazide  (HYZAAR) 50-12.5 MG tablet Take 1 tablet by mouth daily.    [provider]  nystatin  (MYCOSTATIN ) 100000 UNIT/ML suspension Take 5 mLs  (500,000 Units total) by mouth 4 (four) times daily. 09/28/23   [name], Rocky SAILOR, PA-C  omeprazole (PRILOSEC) 20 MG capsule Take 20 mg by mouth daily as needed (Indigestion). 12/25/22   [provider]  pantoprazole  (PROTONIX ) 20 MG tablet Take 1 tablet (20 mg total) by mouth daily. 09/28/23   [name], Rocky SAILOR, PA-C  predniSONE  (DELTASONE ) 10 MG tablet Please take prednisone  40 mg x1 day, then 30 mg x1 day, then 20 mg x1 day, then 10 mg x1 day, and then 5 mg x1 day and stop 10/21/23   Geronimo Amel, MD  promethazine -dextromethorphan  (PROMETHAZINE -DM) 6.25-15 MG/5ML syrup Take 5 mLs by mouth 4 (four) times daily as needed for cough. 06/10/23   Reddick, Johnathan B, NP  Rivaroxaban  (XARELTO ) 15 MG TABS tablet Take 15 mg by mouth daily with supper. 02/28/19   [provider]  spironolactone  (ALDACTONE ) 25 MG tablet Take 0.5 tablets (12.5 mg total) by mouth daily. 01/07/23   Dunn, Dayna N, PA-C  sucralfate  (CARAFATE ) 1 g tablet Take 1 tablet (1 g total) by mouth 4 (four) times daily -  with meals and at bedtime. Crush and dissolve in 10 mL's of warm water prior to swallowing, take 30 min prior to meals 08/23/23   [name], MD  Tiotropium Bromide  Monohydrate (SPIRIVA  RESPIMAT) 1.25 MCG/ACT AERS 2 puff(s) inhaled once a day; Duration: 30 day(s) 08/18/19   [provider]    Allergies: Ciprofloxacin, Crestor  [rosuvastatin ], Lisinopril , and Ofev  [nintedanib]    Review of Systems  Updated Vital Signs BP (!) 165/84   Pulse 79   Temp 97.9 F (36.6 C) (Oral)   Resp (!) 21   Ht 5' 7 (1.702 m)   Wt 56.2 kg   SpO2 100%   BMI 19.42 kg/m   Physical Exam Constitutional:      General: She is not in acute distress. HENT:     Head: Normocephalic and atraumatic.  Eyes:     Conjunctiva/sclera: Conjunctivae normal.     Pupils: Pupils are equal, round, and reactive to light.  Cardiovascular:     Rate and Rhythm: Normal rate and regular rhythm.  Pulmonary:     Effort: Pulmonary  effort is normal. No respiratory distress.  Abdominal:     General: There is no distension.     Tenderness: There is no abdominal tenderness.  Musculoskeletal:     Comments: Full range of motion of the shoulder and upper extremities with no reproducible pain  Skin:    General: Skin is warm and dry.  Neurological:     General: No focal deficit present.     Mental Status: She is alert and oriented to person, place, and time. Mental status is at baseline.  Psychiatric:        Mood and Affect: Mood normal.        Behavior: Behavior normal.     (all labs ordered are listed, but only abnormal results are displayed) Labs  Reviewed  BASIC METABOLIC PANEL WITH GFR - Abnormal; Notable for the following components:      Result Value   Sodium 131 (*)    Glucose, Bld 123 (*)    GFR, Estimated 59 (*)    All other components within normal limits  CBC - Abnormal; Notable for the following components:   Hemoglobin 11.7 (*)    HCT 35.5 (*)    RDW 16.9 (*)    All other components within normal limits  TROPONIN I (HIGH SENSITIVITY) - Abnormal; Notable for the following components:   Troponin I (High Sensitivity) 1,158 (*)    All other components within normal limits  TROPONIN I (HIGH SENSITIVITY) - Abnormal; Notable for the following components:   Troponin I (High Sensitivity) 1,244 (*)    All other components within normal limits  URINALYSIS, ROUTINE W REFLEX MICROSCOPIC  HEPARIN  LEVEL (UNFRACTIONATED)  APTT    EKG: EKG Interpretation Date/Time:  Saturday October 30 2023 13:21:47 EDT Ventricular Rate:  69 PR Interval:  178 QRS Duration:  68 QT Interval:  540 QTC Calculation: 579 R Axis:   -38  Text Interpretation: Sinus rhythm Probable left atrial enlargement Left axis deviation Nonspecific T abnormalities, lateral leads Prolonged QT interval Confirmed by Cottie Cough [phone]) on 10/30/2023 1:23:52 PM  Radiology: [facility] Chest 2 View Result Date: 10/30/2023 CLINICAL DATA:  Productive  cough. EXAM: CHEST - 2 VIEW COMPARISON:  10/21/2023 FINDINGS: Lungs are hyperexpanded. Bibasilar chronic atelectasis or scarring is similar to prior. Superimposed lesion at the left base compatible with known hypermetabolic lesion seen on PET-CT 07/15/2023. No focal airspace consolidation, pneumothorax, or substantial pleural effusion. Cardiopericardial silhouette is at upper limits of normal for size. No acute bony abnormality. Telemetry leads overlie the chest. IMPRESSION: Hyperexpansion with bibasilar chronic atelectasis or scarring. No acute cardiopulmonary findings. Known hypermetabolic lesion left lower lobe better characterized on previous PET imaging. Electronically Signed   By: Camellia Candle M.D.   On: 10/30/2023 11:47     .Critical Care  Performed by: Cottie Cough PARAS, MD Authorized by: Cottie Cough PARAS, MD   Critical care provider statement:    Critical care time (minutes):  40   Critical care time was exclusive of:  Separately billable procedures and treating other patients   Critical care was necessary to treat or prevent imminent or life-threatening deterioration of the following conditions:  Circulatory failure   Critical care was time spent personally by me on the following activities:  Ordering and performing treatments and interventions, ordering and review of laboratory studies, ordering and review of radiographic studies, pulse oximetry, review of old charts, examination of patient and evaluation of patient's response to treatment   Care discussed with: admitting provider   Comments:     Heparin  for NSTEMI    Medications Ordered in the ED  aspirin  tablet 325 mg (325 mg Oral Given 10/30/23 1331)  heparin  ADULT infusion 100 units/mL (25000 units/250mL) (750 Units/hr Intravenous New Bag/Given 10/30/23 1442)    Clinical Course as of 10/30/23 1451  Sat Oct 30, 2023  1051 Patient is pain-free on arrival and not requiring any medications [MT]  1325 Pt pain free - repeat ecg  stable - ordered aspirin , will discuss with cardiology admission for NSTEMI, will clarify last eliquis  dose [MT]  1415 Cardiology consult placed  - okay to start heparin , admit to medicine [MT]  1451 Admitted to hospitalist [MT]    Clinical Course User Index [MT] Cheryln Balcom, Cough PARAS, MD  Medical Decision Making Amount and/or Complexity of Data Reviewed Labs: ordered. Radiology: ordered. ECG/medicine tests: ordered.  Risk OTC drugs. Prescription drug management. Decision regarding hospitalization.   This patient presents to the ED with concern for left shoulder pain, episodic. This involves an extensive number of treatment options, and is a complaint that carries with it a high risk of complications and morbidity.  The differential diagnosis includes musculoskeletal pain including cervical disc herniation or nerve root impingement versus brachial nerve irritation versus atypical ACS versus other  Patient has also had a productive cough reportedly for a month.  We will obtain a chest x-ray.  Co-morbidities that complicate the patient evaluation: Advanced age is a cardiovascular risk factor   I ordered and personally interpreted labs.  The pertinent results include:  trop 1100 -> 1200.  I ordered imaging studies including x-ray of the chest I independently visualized and interpreted imaging which showed no emergent finidngs I agree with the radiologist interpretation  The patient was maintained on a cardiac monitor.  I personally viewed and interpreted the cardiac monitored which showed an underlying rhythm of: NSR  Per my interpretation the patient's ECG shows rate NSR with no acute ischemic changes.  Repeat EKG performed does not show any evident developing ischemic changes.  I ordered medication including aspirin  for ACS  I have reviewed the patients home medicines and have made adjustments as needed  Test Considered: Suspicion for acute PE or  DVT.  Patient is compliant with her Eliquis  for A-fib  I requested consultation with the cardiology,  and discussed lab and imaging findings as well as pertinent plan - they recommend: see ed course  Suspect this may be ACS.  Doubt acute PE as a cause - no hypoxia, tachycardia. Doubt aortic dissection; her absent symptoms don't suggest that  Dispostion:  After consideration of the diagnostic results and the patients response to treatment, I feel that the patent would benefit from medical admission  I updated the patient's daughter at the patient's request by phone at 2:45 pm.      Final diagnoses:  NSTEMI (non-ST elevated myocardial infarction) [facility])    ED Discharge Orders     None          Kerstin Crusoe, Donnice PARAS, MD 10/30/23 1446    Cottie Donnice PARAS, MD 10/30/23 1451

## 2023-10-30 NOTE — Progress Notes (Signed)
 PHARMACY - ANTICOAGULATION  Pharmacy Consult for heparin  Indication: chest pain/ACS Brief A/P: aPTT within goal range Continue Heparin  at current rate   Allergies  Allergen Reactions   Ciprofloxacin Itching, Swelling and Other (See Comments)    Possibly causing tremors?   Crestor  [Rosuvastatin ] Other (See Comments)    Myalgia and back pain   Lisinopril  Cough   Ofev  [Nintedanib] Nausea Only    Patient Measurements: Height: 5' 7 (170.2 cm) Weight: 55.2 kg (121 lb 11.2 oz) IBW/kg (Calculated) : 61.6 HEPARIN  DW (KG): 55.2  Vital Signs: Temp: 97.8 F (36.6 C) (10/04 1959) Temp Source: Oral (10/04 1959) BP: 138/73 (10/04 1959) Pulse Rate: 78 (10/04 1959)  Labs: Recent Labs    10/30/23 1050 10/30/23 1250 10/30/23 2240  HGB 11.7*  --   --   HCT 35.5*  --   --   PLT 162  --   --   APTT  --   --  71*  HEPARINUNFRC  --   --  >1.10*  CREATININE 0.97  --   --   TROPONINIHS 1,158* 1,244*  --     Estimated Creatinine Clearance: 39.6 mL/min (by C-G formula based on SCr of 0.97 mg/dL).  Assessment: 83 y.o. female with chest pain for heparin    Goal of Therapy:  Heparin  level 0.3-0.7 units/ml aPTT 66-102 seconds Monitor platelets by anticoagulation protocol: Yes   Plan:  Continue Heparin  at current rate  Follow-up am labs.   Cathlyn Arrant, PharmD, BCPS  10/30/2023 11:33 PM

## 2023-10-30 NOTE — Consult Note (Signed)
 Cardiology Consultation   Patient ID: [name] MRN: [id number]; DOB: 1940/12/11  Admit date: 10/30/2023 Date of Consult: 10/30/2023  PCP:  [name], MD   [facility] Providers Cardiologist:  Gordy Bergamo, MD  Electrophysiologist:  OLE ONEIDA HOLTS, MD       Patient Profile: [name] is a 83 y.o. female with a hx of hypertension, hyperlipidemia, lupus on Plaquenil , breast cancer, interstitial lung disease. 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 She was admitted in September 2020 for with atrial flutter. This was symptomatic. She was started on anticoagulation and spontaneously cardioverted back to sinus rhythm.   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  Initial diagnosis 07/22/2023, s/p radiation therapy alone. 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  She is being seen 10/30/2023 for the evaluation of anginal pain and elevated troponin at the request of Dr [name].  History of Present Illness: [name] was resting peacefully in bed this morning when her alarm went off at 830.  She then had onset of left arm pain in the triceps area.  It went up into her shoulder.  It was severe.  She had to sit up because it hurts so bad.  She felt a little weak, but denies any actual chest pain or shortness of breath.  She did not take any medications for it.  When the pain did not resolve, she came to the ER.  In the ER, she got 325 mg of aspirin  and was started on heparin .  Her pain has resolved.   Past Medical History:  Diagnosis Date   Acute bronchitis 12/17/2021   Acute cystitis 02/20/2019   Arthritis    Atrial flutter (HCC)    Breast cancer (HCC)    left  2009 and 2025   Diabetes mellitus without complication (HCC)    Dyspnea    mostly with exertion   GERD (gastroesophageal reflux  disease)    Hypertension    Hypothyroidism    Libman Sacks endocarditis (HCC)    Lupus    Pneumonia 07/31/2021    Past Surgical History:  Procedure Laterality Date   BREAST BIOPSY Left 01/15/2023   US  LT BREAST BX W LOC DEV 1ST LESION IMG BX SPEC US  GUIDE 01/15/2023 [facility]   BREAST LUMPECTOMY Left 2009   BRONCHIAL BIOPSY  06/29/2023   Procedure: BRONCHOSCOPY, WITH BIOPSY;  Surgeon: [name], MD;  Location: [facility];  Service: Pulmonary;;   BRONCHIAL BRUSHINGS  06/29/2023   Procedure: BRONCHOSCOPY, WITH BRUSH BIOPSY;  Surgeon: [name], MD;  Location: [facility];  Service: Pulmonary;;   BRONCHIAL NEEDLE ASPIRATION BIOPSY  06/29/2023   Procedure: BRONCHOSCOPY, WITH NEEDLE ASPIRATION BIOPSY;  Surgeon: [name], MD;  Location: [facility];  Service: Pulmonary;;   IR 3D INDEPENDENT WKST  05/29/2021   IR ANGIO INTRA EXTRACRAN SEL COM CAROTID INNOMINATE BILAT MOD SED  05/29/2021   IR ANGIO VERTEBRAL SEL VERTEBRAL BILAT MOD SED  05/29/2021   SIMPLE MASTECTOMY WITH AXILLARY SENTINEL NODE BIOPSY Left 03/31/2023   Procedure: LEFT MASTECTOMY;  Surgeon: Vernetta Berg, MD;  Location: [facility] OR;  Service: General;  Laterality: Left;  LMA PEC BLOCK   VIDEO BRONCHOSCOPY WITH ENDOBRONCHIAL NAVIGATION Left 06/29/2023   Procedure: VIDEO BRONCHOSCOPY WITH ENDOBRONCHIAL NAVIGATION;  Surgeon: [name], MD;  Location: [facility];  Service: Pulmonary;  Laterality: Left;   VIDEO BRONCHOSCOPY WITH ENDOBRONCHIAL ULTRASOUND Bilateral 06/29/2023   Procedure: BRONCHOSCOPY, WITH EBUS;  Surgeon: [name], MD;  Location: [facility] ENDOSCOPY;  Service: Pulmonary;  Laterality: Bilateral;     Home Medications:  Prior to Admission medications   Medication Sig Start Date End Date Taking? Authorizing Provider  albuterol  (ACCUNEB ) 0.63 MG/3ML nebulizer solution Take 3 mLs (0.63 mg total) by nebulization every 6 (six) hours as needed for wheezing. 07/09/23  Yes Ruthell Lauraine FALCON, NP   anastrozole  (ARIMIDEX ) 1 MG tablet Take 1 tablet (1 mg total) by mouth daily. 04/08/23  Yes [name], [name], MD  apixaban  (ELIQUIS ) 5 MG TABS tablet Take 1 tablet (5 mg total) by mouth 2 (two) times daily. Okay to restart on 06/30/2023 06/29/23  Yes [name], MD  diclofenac  Sodium (VOLTAREN ) 1 % GEL Apply 1 application. topically 4 (four) times daily as needed (pain).   Yes [provider]  Accu-Chek Softclix Lancets lancets For use when checking blood sugars ICD-10: E11.69 Once a day alternating mornings and evenings before meals; Duration: 100 days 10/27/22   [provider]  Accu-Chek Softclix Lancets lancets For use when checking blood sugars ICD-10: E11.69 Once a day alternating mornings and evenings before meals; Duration: 100 days 10/27/22   [provider]  albuterol  (VENTOLIN  HFA) 108 (90 Base) MCG/ACT inhaler Inhale 2 puffs into the lungs every 6 (six) hours as needed for wheezing or shortness of breath. Patient not taking: Reported on 10/30/2023 07/09/23   Ruthell Lauraine FALCON, NP  benzonatate  (TESSALON ) 100 MG capsule Take 1 capsule (100 mg total) by mouth 3 (three) times daily as needed for cough. Patient not taking: Reported on 10/30/2023 10/19/23   Vonna Sharlet POUR, MD  Blood Glucose Monitoring Suppl (ACCU-CHEK GUIDE ME) w/Device KIT For use when checking blood sugars as directed DX: E11.69 10/27/22   [provider]  diltiazem  (CARDIZEM  CD) 120 MG 24 hr capsule Take 1 capsule (120 mg total) by mouth daily. 01/07/23   Dunn, Dayna N, PA-C  furosemide (LASIX) 20 MG tablet Take 20 mg by mouth daily.    [provider]  gabapentin  (NEURONTIN ) 300 MG capsule Take 300 mg by mouth 3 (three) times daily.    [provider]  glimepiride  (AMARYL ) 1 MG tablet Take 1 mg by mouth daily with breakfast.    [provider]  HYDROcodone -acetaminophen  (HYCET) 7.5-325 mg/15 ml solution Take 10 mLs by mouth 4 (four) times daily as needed for moderate  pain (pain score 4-6). 09/29/23 09/28/24  [name], MD  hydroxychloroquine  (PLAQUENIL ) 200 MG tablet Take 200 mg by mouth daily.    [provider]  Ipratropium-Albuterol  (COMBIVENT RESPIMAT) 20-100 MCG/ACT AERS respimat Inhale 1 puff into the lungs every 6 (six) hours as needed. 11/16/17   [provider]  levothyroxine  (SYNTHROID ) 100 MCG tablet Take 100 mcg by mouth daily. 02/05/21   [provider]  losartan  (COZAAR ) 50 MG tablet Take 50 mg by mouth daily.    [provider]  losartan -hydrochlorothiazide  (HYZAAR) 50-12.5 MG tablet Take 1 tablet by mouth daily.    [provider]  nystatin  (MYCOSTATIN ) 100000 UNIT/ML suspension Take 5 mLs (500,000 Units total) by mouth 4 (four) times daily. 09/28/23   [name], Rocky SAILOR, PA-C  omeprazole (PRILOSEC) 20 MG capsule Take 20 mg by mouth daily as needed (Indigestion). 12/25/22   [provider]  pantoprazole  (PROTONIX ) 20 MG tablet Take 1 tablet (20 mg total) by mouth daily. 09/28/23  [name], Rocky SAILOR, PA-C  predniSONE  (DELTASONE ) 10 MG tablet Please take prednisone  40 mg x1 day, then 30 mg x1 day, then 20 mg x1 day, then 10 mg x1 day, and then 5 mg x1 day and stop 10/21/23   Geronimo Amel, MD  promethazine -dextromethorphan  (PROMETHAZINE -DM) 6.25-15 MG/5ML syrup Take 5 mLs by mouth 4 (four) times daily as needed for cough. 06/10/23   Reddick, Johnathan B, NP  Rivaroxaban  (XARELTO ) 15 MG TABS tablet Take 15 mg by mouth daily with supper. 02/28/19   [provider]  spironolactone  (ALDACTONE ) 25 MG tablet Take 0.5 tablets (12.5 mg total) by mouth daily. 01/07/23   Dunn, Dayna N, PA-C  sucralfate  (CARAFATE ) 1 g tablet Take 1 tablet (1 g total) by mouth 4 (four) times daily -  with meals and at bedtime. Crush and dissolve in 10 mL's of warm water prior to swallowing, take 30 min prior to meals 08/23/23   [name], MD  Tiotropium Bromide  Monohydrate (SPIRIVA  RESPIMAT) 1.25 MCG/ACT AERS 2 puff(s) inhaled  once a day; Duration: 30 day(s) 08/18/19   [provider]    Scheduled Meds:  aspirin   325 mg Oral Daily   diltiazem   120 mg Oral Daily   gabapentin   300 mg Oral TID   [START ON 10/31/2023] hydroxychloroquine   200 mg Oral Daily   [START ON 10/31/2023] levothyroxine   100 mcg Oral Daily   [START ON 10/31/2023] pantoprazole   20 mg Oral Daily   Continuous Infusions:  heparin  750 Units/hr (10/30/23 1442)   PRN Meds: ipratropium-albuterol   Allergies:    Allergies  Allergen Reactions   Ciprofloxacin Itching, Swelling and Other (See Comments)    Possibly causing tremors?   Crestor  [Rosuvastatin ] Other (See Comments)    Myalgia and back pain   Lisinopril  Cough   Ofev  [Nintedanib] Nausea Only    Social History:   Social History   Socioeconomic History   Marital status: Single    Spouse name: Not on file   Number of children: 2   Years of education: Not on file   Highest education level: Not on file  Occupational History   Not on file  Tobacco Use   Smoking status: Never    Passive exposure: Never   Smokeless tobacco: Not on file  Vaping Use   Vaping status: Never Used  Substance and Sexual Activity   Alcohol  use: No   Drug use: No   Sexual activity: Not Currently  Other Topics Concern   Not on file  Social History Narrative   Not on file   Social Drivers of Health   Financial Resource Strain: Not on file  Food Insecurity: Food Insecurity Present (10/30/2023)   Hunger Vital Sign    Worried About Running Out of Food in the Last Year: Sometimes true    Ran Out of Food in the Last Year: Sometimes true  Transportation Needs: No Transportation Needs (10/30/2023)   PRAPARE - Administrator, Civil Service (Medical): No    Lack of Transportation (Non-Medical): No  Physical Activity: Not on file  Stress: Not on file  Social Connections: Moderately Integrated (10/30/2023)   Social Connection and Isolation Panel    Frequency of Communication with Friends  and Family: More than three times a week    Frequency of Social Gatherings with Friends and Family: Once a week    Attends Religious Services: More than 4 times per year    Active Member of [employer] or Organizations: Yes  Attends Banker Meetings: 1 to 4 times per year    Marital Status: Divorced  Intimate Partner Violence: Not At Risk (10/30/2023)   Humiliation, Afraid, Rape, and Kick questionnaire    Fear of Current or Ex-Partner: No    Emotionally Abused: No    Physically Abused: No    Sexually Abused: No    Family History:   Family History  Problem Relation Age of Onset   Cancer Father        prostate   Prostate cancer Father 48 - 16       metaststic   Asthma Brother    Cancer Daughter        breast   Breast cancer Daughter 37     ROS:  Please see the history of present illness.  All other ROS reviewed and negative.     Physical Exam/Data: Vitals:   10/30/23 1444 10/30/23 1444 10/30/23 1536 10/30/23 1543  BP: (!) 165/84   (!) 145/89  Pulse: 79   80  Resp: (!) 21   16  Temp:  97.9 F (36.6 C)  98.6 F (37 C)  TempSrc:  Oral  Oral  SpO2: 100%   100%  Weight:   55.2 kg   Height:   5' 7 (1.702 m)    No intake or output data in the 24 hours ending 10/30/23 1605    10/30/2023    3:36 PM 10/30/2023   10:26 AM 10/21/2023   10:54 AM  Last 3 Weights  Weight (lbs) 121 lb 11.2 oz 124 lb 124 lb 3.2 oz  Weight (kg) 55.203 kg 56.246 kg 56.337 kg     Body mass index is 19.06 kg/m.  General:  Well nourished, well developed, in no acute distress HEENT: normal Neck: no JVD Vascular: No carotid bruits; Distal pulses 2+ bilaterally Cardiac:  normal S1, S2; RRR; 2/6 murmur left middle sternal border Lungs:  clear to auscultation bilaterally, no wheezing, rhonchi or rales  Abd: soft, nontender, no hepatomegaly  Ext: no edema Musculoskeletal:  No deformities, BUE and BLE strength normal and equal Skin: warm and dry  Neuro:  CNs 2-12 intact, no focal  abnormalities noted Psych:  Normal affect   EKG:  The EKG was personally reviewed and demonstrates:  SR, HR 69, lateral T wave changes from 08/18, diffuse T wave flattening Telemetry:  Telemetry was personally reviewed and demonstrates: Sinus rhythm  Relevant CV Studies:  ECHO: 06/19/2023  1. Left ventricular ejection fraction, by estimation, is 60 to 65%. The  left ventricle has normal function. The left ventricle has no regional  wall motion abnormalities. There is moderate asymmetric left ventricular hypertrophy of the basal-septal segment. Left ventricular diastolic parameters are consistent with Grade I diastolic dysfunction (impaired relaxation).   2. Right ventricular systolic function is normal. The right ventricular  size is normal. There is mildly elevated pulmonary artery systolic  pressure. The estimated right ventricular systolic pressure is 37.6 mmHg.   3. The mitral valve is degenerative. Thickened leaflets. Trivial mitral  valve regurgitation.   4. The aortic valve is tricuspid. Aortic valve regurgitation is not  visualized. Aortic valve sclerosis is present, with no evidence of aortic valve stenosis.   5. The inferior vena cava is normal in size with greater than 50%  respiratory variability, suggesting right atrial pressure of 3 mmHg.   Laboratory Data: High Sensitivity Troponin:   Recent Labs  Lab 10/30/23 1050 10/30/23 1250  TROPONINIHS 1,158* 1,244*  Chemistry Recent Labs  Lab 10/30/23 1050  NA 131*  K 4.7  CL 99  CO2 22  GLUCOSE 123*  BUN 14  CREATININE 0.97  CALCIUM  9.0  GFRNONAA 59*  ANIONGAP 10    No results for input(s): PROT, ALBUMIN, AST, ALT, ALKPHOS, BILITOT in the last 168 hours. Lipids No results for input(s): CHOL, TRIG, HDL, LABVLDL, LDLCALC, CHOLHDL in the last 168 hours.  Hematology Recent Labs  Lab 10/30/23 1050  WBC 8.0  RBC 4.06  HGB 11.7*  HCT 35.5*  MCV 87.4  MCH 28.8  MCHC 33.0  RDW 16.9*   PLT 162   Thyroid  No results for input(s): TSH, FREET4 in the last 168 hours.  BNPNo results for input(s): BNP, PROBNP in the last 168 hours.  DDimer No results for input(s): DDIMER in the last 168 hours.  Radiology/Studies:  DG Chest 2 View Result Date: 10/30/2023 CLINICAL DATA:  Productive cough. EXAM: CHEST - 2 VIEW COMPARISON:  10/21/2023 FINDINGS: Lungs are hyperexpanded. Bibasilar chronic atelectasis or scarring is similar to prior. Superimposed lesion at the left base compatible with known hypermetabolic lesion seen on PET-CT 07/15/2023. No focal airspace consolidation, pneumothorax, or substantial pleural effusion. Cardiopericardial silhouette is at upper limits of normal for size. No acute bony abnormality. Telemetry leads overlie the chest. IMPRESSION: Hyperexpansion with bibasilar chronic atelectasis or scarring. No acute cardiopulmonary findings. Known hypermetabolic lesion left lower lobe better characterized on previous PET imaging. Electronically Signed   By: Camellia Candle M.D.   On: 10/30/2023 11:47     Assessment and Plan: Non-STEMI - She is pain-free on heparin . - Her pain has some atypical features, but her troponin elevation cannot be ignored - She will need definitive evaluation with a heart catheterization - Hold the Eliquis , continue the heparin  and plan for cath on Monday - Add nitrates if she has pain recurrence - Check echo - Check lipids, she did not tolerate Crestor  in the past with myalgias and back pain. - MD advise if we should try a weaker statin such as pravastatin or just use Zetia  2.  History of breast cancer and lung cancer - She is on Arimidex  for the breast cancer and has completed radiation therapy.  3.  History of atrial flutter: -She is maintaining sinus rhythm on Eliquis  5 mg twice daily and Cardizem  CD 120 mg daily - Hold the Eliquis , continue the Cardizem   Otherwise, per IM   Risk Assessment/Risk Scores:    TIMI Risk Score  for Unstable Angina or Non-ST Elevation MI:   The patient's TIMI risk score is 5, which indicates a 26% risk of all cause mortality, new or recurrent myocardial infarction or need for urgent revascularization in the next 14 days.   CHA2DS2-VASc Score = 5   This indicates a 7.2% annual risk of stroke. The patient's score is based upon: CHF History: 0 HTN History: 1 Diabetes History: 1 Stroke History: 0 Vascular Disease History: 0 Age Score: 2 Gender Score: 1    For questions or updates, please contact [facility] Please consult [email or web address] for contact info under      Signed, Shona Shad, PA-C  10/30/2023 4:05 PM

## 2023-10-31 DIAGNOSIS — I214 Non-ST elevation (NSTEMI) myocardial infarction: Secondary | ICD-10-CM

## 2023-10-31 LAB — CBC
HCT: 30.8 % — ABNORMAL LOW (ref 36.0–46.0)
Hemoglobin: 10.3 g/dL — ABNORMAL LOW (ref 12.0–15.0)
MCH: 28.3 pg (ref 26.0–34.0)
MCHC: 33.4 g/dL (ref 30.0–36.0)
MCV: 84.6 fL (ref 80.0–100.0)
Platelets: 153 K/uL (ref 150–400)
RBC: 3.64 MIL/uL — ABNORMAL LOW (ref 3.87–5.11)
RDW: 16.9 % — ABNORMAL HIGH (ref 11.5–15.5)
WBC: 6.7 K/uL (ref 4.0–10.5)
nRBC: 0 % (ref 0.0–0.2)

## 2023-10-31 LAB — TROPONIN I (HIGH SENSITIVITY): Troponin I (High Sensitivity): 869 ng/L (ref <18)

## 2023-10-31 LAB — HEPARIN LEVEL (UNFRACTIONATED): Heparin Unfractionated: >1.1 [IU]/mL — ABNORMAL HIGH (ref 0.30–0.70)

## 2023-10-31 LAB — APTT: aPTT: 99 s — ABNORMAL HIGH (ref 24–36)

## 2023-10-31 MED ORDER — ATORVASTATIN CALCIUM 40 MG PO TABS
40.0000 mg | ORAL_TABLET | Freq: Every day | ORAL | Status: DC
  Administered 2023-10-31 – 2023-11-02 (×3): 40 mg via ORAL
  Filled 2023-10-31 (×3): qty 1

## 2023-10-31 MED ORDER — BISACODYL 10 MG RE SUPP
10.0000 mg | Freq: Once | RECTAL | Status: AC
  Administered 2023-10-31: 10 mg via RECTAL
  Filled 2023-10-31: qty 1

## 2023-10-31 MED ORDER — ASPIRIN 81 MG PO CHEW
81.0000 mg | CHEWABLE_TABLET | ORAL | Status: AC
  Administered 2023-11-01: 81 mg via ORAL
  Filled 2023-10-31: qty 1

## 2023-10-31 MED ORDER — ASPIRIN 81 MG PO TBEC
81.0000 mg | DELAYED_RELEASE_TABLET | Freq: Every day | ORAL | Status: DC
  Administered 2023-11-02: 81 mg via ORAL
  Filled 2023-10-31 (×2): qty 1

## 2023-10-31 MED ORDER — FREE WATER
500.0000 mL | Freq: Once | Status: AC
  Administered 2023-11-01: 500 mL via ORAL

## 2023-10-31 NOTE — Care Management (Addendum)
 Transition of Care [facility]) - Inpatient Brief Assessment   Patient Details  Name: [name] MRN: [id number] Date of Birth: 1940-11-18  Transition of Care [facility]) CM/SW Contact:    Kathryn JAYSON Canary, RN Phone Number: 10/31/2023, 9:13 AM   Clinical Narrative:  83 yo presented with arm pain NSTEMI. She is on oral chemo for breast CA  Currently on heparin  IV. SDOH addressed 1050 walker ordered from [employer]  No other needs identified at this time.   Transition of Care Asessment: Insurance and Status: Insurance coverage has been reviewed Patient has primary care physician: Yes Home environment has been reviewed: [facility] Prior level of function:: Independent Prior/Current Home Services: No current home services     Transition of care needs: transition of care needs identified, TOC will continue to follow

## 2023-10-31 NOTE — Plan of Care (Signed)

## 2023-10-31 NOTE — Evaluation (Signed)
 Occupational Therapy Evaluation & Discharge Patient Details Name: [name] MRN: [id number] DOB: Dec 30, 1940 Today's Date: 10/31/2023   History of Present Illness   Pt is a 83 y.o female admitted 10/4 for NSTEMI. Recent admission 05/2023 for acute PE and new dx of lung cancer. PMH: arthritis, lupus, ILD, breast cancer s/p mastectomy 03/31/2023, HTN, DM II, HLD, and frequent UTIs, afib     Clinical Impressions Pt admitted based on above, and was seen based on problem list below. PTA pt was living with family and was mod I with ADLs and IADLs. Today pt is at her functional baseline for ADLs. Pt completing standing grooming tasks, LB dressing and toilet transfers with mod I. Ambulated in hallway ~159ft, pt reporting preference for rollator at d/c to promote energy conservation in community. Pt with adequate support available at home, no follow up OT needs. Pt at baseline for ADLs and mobility no further acute OT needs identifed. OT is signing off on this pt. Encouraged pt to complete mobilize with staff and mobility specialist to prevent deconditioning.    If plan is discharge home, recommend the following:   Assist for transportation     Functional Status Assessment   Patient has not had a recent decline in their functional status     Equipment Recommendations   Other (comment) (Rollator)      Precautions/Restrictions   Precautions Precautions: Fall Recall of Precautions/Restrictions: Intact Restrictions Weight Bearing Restrictions Per Provider Order: No     Mobility Bed Mobility Overal bed mobility: Modified Independent        Transfers Overall transfer level: Modified independent Equipment used: Rollator (4 wheels)     General transfer comment: Pt ambulating ~130ft with use of RW and rollator, reporting preference for rollator to promote energy conservation      Balance Overall balance assessment: Mild deficits observed, not formally tested       ADL  either performed or assessed with clinical judgement   ADL Overall ADL's : At baseline;Modified independent                                       General ADL Comments: Pt completing standing ADLs, toileting, and functional transfers at mod I     Vision Baseline Vision/History: 0 No visual deficits Patient Visual Report: No change from baseline Vision Assessment?: No apparent visual deficits            Pertinent Vitals/Pain Pain Assessment Pain Assessment: No/denies pain     Extremity/Trunk Assessment Upper Extremity Assessment Upper Extremity Assessment: Overall WFL for tasks assessed   Lower Extremity Assessment Lower Extremity Assessment: Overall WFL for tasks assessed   Cervical / Trunk Assessment Cervical / Trunk Assessment: Normal   Communication Communication Communication: No apparent difficulties   Cognition Arousal: Alert Behavior During Therapy: WFL for tasks assessed/performed Cognition: No apparent impairments     Following commands: Intact       Cueing  General Comments   Cueing Techniques: Verbal cues  VSS on RA           Home Living Family/patient expects to be discharged to:: Private residence Living Arrangements: Children Available Help at Discharge: Family;Available 24 hours/day Type of Home: House Home Access: Stairs to enter [employer] of Steps: 1 Entrance Stairs-Rails: Can reach both Home Layout: One level     Bathroom Shower/Tub: Industrial/product designer Accessibility:  Yes   Home Equipment: [place] - single point;Tub bench;BSC/3in1          Prior Functioning/Environment Prior Level of Function : Independent/Modified Independent             Mobility Comments: Use SPC, denies falls ADLs Comments: Ind with ADLs and IADLs    OT Problem List: Cardiopulmonary status limiting activity        OT Goals(Current goals can be found in the care plan section)    Acute Rehab OT Goals Patient Stated Goal: To go home OT Goal Formulation: All assessment and education complete, DC therapy Time For Goal Achievement: 11/14/23 Potential to Achieve Goals: Good   AM-PAC OT 6 Clicks Daily Activity     Outcome Measure Help from another person eating meals?: None Help from another person taking care of personal grooming?: None Help from another person toileting, which includes using toliet, bedpan, or urinal?: None Help from another person bathing (including washing, rinsing, drying)?: None Help from another person to put on and taking off regular upper body clothing?: None Help from another person to put on and taking off regular lower body clothing?: None 6 Click Score: 24   End of Session Equipment Utilized During Treatment: Rollator (4 wheels);Rolling walker (2 wheels) Nurse Communication: Mobility status  Activity Tolerance: Patient tolerated treatment well Patient left: in chair;with call bell/phone within reach  OT Visit Diagnosis: Other (comment)                Time: 9063-8985 OT Time Calculation (min): 38 min Charges:  OT General Charges $OT Visit: 1 Visit OT Evaluation $OT Eval Low Complexity: 1 Low OT Treatments $Self Care/Home Management : 23-37 mins  Adrianne BROCKS, OT  Acute Rehabilitation Services Office [phone] Secure chat preferred   Adrianne GORMAN Savers 10/31/2023, 11:16 AM

## 2023-10-31 NOTE — Progress Notes (Signed)
 PHARMACY - ANTICOAGULATION  Pharmacy Consult for heparin  Indication: chest pain/ACS and afib  Allergies  Allergen Reactions   Ciprofloxacin Itching, Swelling and Other (See Comments)    Possibly causing tremors?   Crestor  [Rosuvastatin ] Other (See Comments)    Myalgia and back pain   Lisinopril  Cough   Ofev  [Nintedanib] Nausea Only   Patient Measurements: Height: 5' 7 (170.2 cm) Weight: 55.2 kg (121 lb 11.2 oz) IBW/kg (Calculated) : 61.6 HEPARIN  DW (KG): 55.2  Vital Signs: Temp: 97.5 F (36.4 C) (10/05 0409) Temp Source: Oral (10/05 0409) BP: 147/66 (10/05 0409) Pulse Rate: 68 (10/05 0409)  Labs: Recent Labs    10/30/23 1050 10/30/23 1250 10/30/23 2240 10/31/23 0312  HGB 11.7*  --   --  10.3*  HCT 35.5*  --   --  30.8*  PLT 162  --   --  153  APTT  --   --  71* 99*  HEPARINUNFRC  --   --  >1.10* >1.10*  CREATININE 0.97  --   --   --   TROPONINIHS 1,158* 1,244*  --   --    Estimated Creatinine Clearance: 39.6 mL/min (by C-G formula based on SCr of 0.97 mg/dL).  Assessment: 83 y.o. female presenting with chest pain. Troponins elevated 1,158 > 1,244. Cardiology consulted and planning for [facility] on Monday. Pharmacy consulted to dose heparin  infusion in meantime. Patient also on Eliquis  PTA for aflutter. Last dose reported 10/3 in the AM. 10/4 AM heparin  level elevated > 1.1 likely due to recent Eliquis  use. Therefore, monitoring using aPTT.   10/4 AM aPTT therapeutic at 99 on infusion of 750 units/h. This is the 2nd therapeutic aPTT. Hgb trend 11.1 > 10.3; platelets wnl. No issues with bleeding or heparin  infusion documented.   Goal of Therapy:  Heparin  level 0.3-0.7 units/ml aPTT 66-102 seconds Monitor platelets by anticoagulation protocol: Yes   Plan:  Continue heparin  infusion at 750 units/h.  Monitor aPTT, HL, CBC daily.  Continue to monitor with aPTT unless aPTT/heparin  levels clearly correlating.   Maurilio Patten, PharmD PGY1 Pharmacy Resident [facility] 10/31/2023 7:24 AM

## 2023-10-31 NOTE — Progress Notes (Signed)
  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   Assessment and Plan: NSTEMI -Cards consulted -Continue hep gtt  -continue Cardizem  120mg  daily - Cardiology recs for heart cath in AM -Currently without chest pain   HTN -PTA Cardizem  per above -HOLD pta lasix and losartan    Hypothyroid -PTA Synthroid  100mcg daily   SLE -PTA Plaquenil  200mg  daily   H/o stage IIIA (cT2a, N2, M0) adenocarcinoma, non-small cell lung cancer, of the LLL -HOLD pta anastrazole for now      Subjective: No chest pain this AM  Physical Exam: Vitals:   10/31/23 0029 10/31/23 0409 10/31/23 0931 10/31/23 1229  BP: 117/64 (!) 147/66 (!) 119/59 (!) 103/91  Pulse: 77 68  62  Resp: 16 16  18   Temp: 97.9 F (36.6 C) (!) 97.5 F (36.4 C)  97.8 F (36.6 C)  TempSrc: Oral Oral  Oral  SpO2: 100% 100%  100%  Weight:      Height:       General exam: Awake, laying in bed, in nad Respiratory system: Normal respiratory effort, no wheezing Cardiovascular system: regular rate, s1, s2 Gastrointestinal system: Soft, nondistended, positive BS Central nervous system: CN2-12 grossly intact, strength intact Extremities: Perfused, no clubbing Skin: Normal skin turgor, no notable skin lesions seen Psychiatry: Mood normal // no visual hallucinations   Data Reviewed:  WBC 6.7, Hgb 10.3, Plts 153  Family Communication: Pt in room, family not at bedside  Disposition: Status is: Inpatient Remains inpatient appropriate because: Severity of illness  Planned Discharge Destination: Home    Author: Garnette Pelt, MD 10/31/2023 4:15 PM  For on call review  [email or web address].

## 2023-10-31 NOTE — Hospital Course (Addendum)
 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

## 2023-10-31 NOTE — Progress Notes (Signed)
  Progress Note  Patient Name: [name] Date of Encounter: 10/31/2023 [facility] Cardiologist: Gordy Bergamo, MD   Interval Summary   No events overnight. She has not had any CP since presenting to ED. She has no complaints.   Vital Signs Vitals:   10/31/23 0029 10/31/23 0409 10/31/23 0931 10/31/23 1229  BP: 117/64 (!) 147/66 (!) 119/59 (!) 103/91  Pulse: 77 68  62  Resp: 16 16  18   Temp: 97.9 F (36.6 C) (!) 97.5 F (36.4 C)  97.8 F (36.6 C)  TempSrc: Oral Oral  Oral  SpO2: 100% 100%  100%  Weight:      Height:        Intake/Output Summary (Last 24 hours) at 10/31/2023 1334 Last data filed at 10/30/2023 2100 Gross per 24 hour  Intake 746.07 ml  Output --  Net 746.07 ml      10/30/2023    3:36 PM 10/30/2023   10:26 AM 10/21/2023   10:54 AM  Last 3 Weights  Weight (lbs) 121 lb 11.2 oz 124 lb 124 lb 3.2 oz  Weight (kg) 55.203 kg 56.246 kg 56.337 kg      Telemetry/ECG  NSR - Personally Reviewed  Physical Exam  GEN: No acute distress.   Neck: No JVD Cardiac: RRR, no murmurs, rubs, or gallops.  Respiratory: Clear to auscultation bilaterally. GI: Soft, nontender, non-distended  MS: No edema  Assessment & Plan  ELIE GRAGERT is a 83 y.o. year-old female with history of HTN, Aflutter on Eliquis , DM, and recently diagnosed lung cancer diagnosis s/p radiation therapy presenting with left arm pain and found to have troponin elevation. - Pain does not sound like typical angina, but given troponin elevation --> will treat like NSTEMI; s/p ASA 325 x 1 - Cont heparin  gtt, ASA 81, rosuvastain 40 daily - SLN nitroglycerin as needed for CP - Hold Eliquis  - Trend troponin to peak - Follow up with TTE - Plan for LHC in AM  Informed Consent   Shared Decision Making/Informed Consent The risks [stroke (1 in 1000), death (1 in 1000), kidney failure [usually temporary] (1 in 500), bleeding (1 in 200), allergic reaction [possibly serious] (1 in 200)], benefits  (diagnostic support and management of coronary artery disease) and alternatives of a cardiac catheterization were discussed in detail with Ms. Wysong and she is willing to proceed.       For questions or updates, please contact [facility] Please consult [email or web address] for contact info under   Signed, Joelle VEAR Ren Donley, MD

## 2023-11-01 ENCOUNTER — Inpatient Hospital Stay (HOSPITAL_COMMUNITY)

## 2023-11-01 ENCOUNTER — Inpatient Hospital Stay (HOSPITAL_COMMUNITY)
Admission: EM | Disposition: A | Discharge status: Home / Self Care | Payer: Self-pay | Source: Home / Self Care | Attending: Internal Medicine

## 2023-11-01 DIAGNOSIS — I214 Non-ST elevation (NSTEMI) myocardial infarction: Secondary | ICD-10-CM

## 2023-11-01 DIAGNOSIS — I2510 Atherosclerotic heart disease of native coronary artery without angina pectoris: Secondary | ICD-10-CM | POA: Diagnosis not present

## 2023-11-01 DIAGNOSIS — I4892 Unspecified atrial flutter: Secondary | ICD-10-CM | POA: Diagnosis not present

## 2023-11-01 HISTORY — PX: LEFT HEART CATH AND CORONARY ANGIOGRAPHY: CATH118249

## 2023-11-01 LAB — CBC
HCT: 32 % — ABNORMAL LOW (ref 36.0–46.0)
Hemoglobin: 10.4 g/dL — ABNORMAL LOW (ref 12.0–15.0)
MCH: 28.5 pg (ref 26.0–34.0)
MCHC: 32.5 g/dL (ref 30.0–36.0)
MCV: 87.7 fL (ref 80.0–100.0)
Platelets: 153 K/uL (ref 150–400)
RBC: 3.65 MIL/uL — ABNORMAL LOW (ref 3.87–5.11)
RDW: 17.2 % — ABNORMAL HIGH (ref 11.5–15.5)
WBC: 7 K/uL (ref 4.0–10.5)
nRBC: 0 % (ref 0.0–0.2)

## 2023-11-01 LAB — COMPREHENSIVE METABOLIC PANEL WITH GFR
ALT: 15 U/L (ref 0–44)
AST: 22 U/L (ref 15–41)
Albumin: 3.1 g/dL — ABNORMAL LOW (ref 3.5–5.0)
Alkaline Phosphatase: 49 U/L (ref 38–126)
Anion gap: 12 (ref 5–15)
BUN: 15 mg/dL (ref 8–23)
CO2: 22 mmol/L (ref 22–32)
Calcium: 9 mg/dL (ref 8.9–10.3)
Chloride: 98 mmol/L (ref 98–111)
Creatinine, Ser: 0.91 mg/dL (ref 0.44–1.00)
GFR, Estimated: >60 mL/min (ref >60)
Glucose, Bld: 94 mg/dL (ref 70–99)
Potassium: 3.8 mmol/L (ref 3.5–5.1)
Sodium: 132 mmol/L — ABNORMAL LOW (ref 135–145)
Total Bilirubin: 0.4 mg/dL (ref 0.0–1.2)
Total Protein: 6.9 g/dL (ref 6.5–8.1)

## 2023-11-01 LAB — ECHOCARDIOGRAM COMPLETE
Area-P 1/2: 1.93 cm2
Height: 67 in
S' Lateral: 2.1 cm
Weight: 1947.2 [oz_av]

## 2023-11-01 LAB — APTT: aPTT: 106 s — ABNORMAL HIGH (ref 24–36)

## 2023-11-01 LAB — HEPARIN LEVEL (UNFRACTIONATED): Heparin Unfractionated: 1.04 [IU]/mL — ABNORMAL HIGH (ref 0.30–0.70)

## 2023-11-01 SURGERY — LEFT HEART CATH AND CORONARY ANGIOGRAPHY
Anesthesia: LOCAL

## 2023-11-01 MED ORDER — LIDOCAINE HCL (PF) 1 % IJ SOLN
INTRAMUSCULAR | Status: DC | PRN
  Administered 2023-11-01: 5 mL

## 2023-11-01 MED ORDER — MIDAZOLAM HCL 2 MG/2ML IJ SOLN
INTRAMUSCULAR | Status: AC
  Filled 2023-11-01: qty 2

## 2023-11-01 MED ORDER — SODIUM CHLORIDE 0.9% FLUSH
3.0000 mL | Freq: Two times a day (BID) | INTRAVENOUS | Status: DC
  Administered 2023-11-01 – 2023-11-02 (×3): 3 mL via INTRAVENOUS

## 2023-11-01 MED ORDER — SODIUM CHLORIDE 0.9% FLUSH: 3.0000 mL | INTRAVENOUS | Status: DC | PRN

## 2023-11-01 MED ORDER — MIDAZOLAM HCL 2 MG/2ML IJ SOLN
INTRAMUSCULAR | Status: DC | PRN
  Administered 2023-11-01: 1 mg via INTRAVENOUS

## 2023-11-01 MED ORDER — ACETAMINOPHEN 325 MG PO TABS
650.0000 mg | ORAL_TABLET | Freq: Once | ORAL | Status: AC
  Administered 2023-11-01: 650 mg via ORAL
  Filled 2023-11-01: qty 2

## 2023-11-01 MED ORDER — VERAPAMIL HCL 2.5 MG/ML IV SOLN
INTRAVENOUS | Status: AC
  Filled 2023-11-01: qty 2

## 2023-11-01 MED ORDER — LIDOCAINE HCL (PF) 1 % IJ SOLN
INTRAMUSCULAR | Status: AC
  Filled 2023-11-01: qty 30

## 2023-11-01 MED ORDER — HEPARIN (PORCINE) IN NACL 1000-0.9 UT/500ML-% IV SOLN
INTRAVENOUS | Status: DC | PRN
  Administered 2023-11-01: 1000 mL

## 2023-11-01 MED ORDER — FENTANYL CITRATE (PF) 100 MCG/2ML IJ SOLN
INTRAMUSCULAR | Status: AC
  Filled 2023-11-01: qty 2

## 2023-11-01 MED ORDER — IOHEXOL 350 MG/ML SOLN
INTRAVENOUS | Status: DC | PRN
  Administered 2023-11-01: 50 mL

## 2023-11-01 MED ORDER — FENTANYL CITRATE (PF) 100 MCG/2ML IJ SOLN
INTRAMUSCULAR | Status: DC | PRN
  Administered 2023-11-01: 25 ug via INTRAVENOUS

## 2023-11-01 MED ORDER — SODIUM CHLORIDE 0.9 % IV SOLN: 250.0000 mL | INTRAVENOUS | Status: AC | PRN

## 2023-11-01 MED ORDER — FREE WATER
500.0000 mL | Freq: Once | Status: AC
  Administered 2023-11-01: 500 mL via ORAL

## 2023-11-01 SURGICAL SUPPLY — 9 items
CATH 5FR JL3.5 JR4 ANG PIG MP (CATHETERS) IMPLANT
CATH INFINITI 5 FR 3DRC (CATHETERS) IMPLANT
GLIDESHEATH SLEND SS 6F .021 (SHEATH) IMPLANT
GUIDEWIRE INQWIRE 1.5J.035X260 (WIRE) IMPLANT
KIT MICROPUNCTURE NIT STIFF (SHEATH) IMPLANT
PACK CARDIAC CATHETERIZATION (CUSTOM PROCEDURE TRAY) ×1 IMPLANT
SET ATX-X65L (MISCELLANEOUS) IMPLANT
SHEATH PINNACLE 5F 10CM (SHEATH) IMPLANT
SHEATH PROBE COVER 6X72 (BAG) IMPLANT

## 2023-11-01 NOTE — Interval H&P Note (Signed)
 History and Physical Interval Note:  11/01/2023 10:20 AM  [name]  has presented today for surgery, with the diagnosis of NSTEMI.  The various methods of treatment have been discussed with the patient and family. After consideration of risks, benefits and other options for treatment, the patient has consented to  Procedure(s): LEFT HEART CATH AND CORONARY ANGIOGRAPHY (N/A) as a surgical intervention.  The patient's history has been reviewed, patient examined, no change in status, stable for surgery.  I have reviewed the patient's chart and labs.  Questions were answered to the patient's satisfaction.     [name]

## 2023-11-01 NOTE — H&P (View-Only) (Signed)
  Progress Note  Patient Name: [name] Date of Encounter: 11/01/2023 [facility] Cardiologist: Gordy Bergamo, MD   Interval Summary   No CP or dyspnea overnight  Vital Signs Vitals:   11/01/23 0043 11/01/23 0542 11/01/23 0553 11/01/23 0756  BP: 115/63  (!) 110/59 121/70  Pulse: 64  (!) 58 (!) 58  Resp: 16   16  Temp: 98.6 F (37 C) 98.2 F (36.8 C)  (P) 97.8 F (36.6 C)  TempSrc: Oral Oral    SpO2: 100%  98% (P) 98%  Weight:      Height:        Intake/Output Summary (Last 24 hours) at 11/01/2023 0844 Last data filed at 11/01/2023 0557 Gross per 24 hour  Intake 1223.31 ml  Output --  Net 1223.31 ml      10/30/2023    3:36 PM 10/30/2023   10:26 AM 10/21/2023   10:54 AM  Last 3 Weights  Weight (lbs) 121 lb 11.2 oz 124 lb 124 lb 3.2 oz  Weight (kg) 55.203 kg 56.246 kg 56.337 kg      Telemetry/ECG  NSR/sinus brady, rare PVCs- Personally Reviewed  Physical Exam  GEN: No acute distress. Sedated   Neck: No JVD Cardiac: RRR, no murmurs, rubs, or gallops.  Respiratory: Clear to auscultation bilaterally. 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  For cardiac catheterization possible percutaneous intervention today.  CT chest (non-gated) shows very high burden of calcified coronary plaque, including and the left coronary artery and proximal LAD artery.  It is not inconceivable that we will find that she has surgical anatomy.  However in view of her  age, pulmonary disease and history of recent chest XRT, percutaneous revascularization may remain the best option, if appropriate.  Echocardiogram pending this morning.  Informed Consent   Shared Decision Making/Informed Consent The risks [stroke (1 in 1000), death (1 in 1000), kidney failure [usually temporary] (1 in 500), bleeding (1 in 200), allergic reaction [possibly serious] (1 in 200)], benefits (diagnostic support and management of coronary artery disease) and alternatives of a cardiac catheterization were discussed in detail with Ms. Delangel and she is willing to proceed.       For questions or updates, please contact [facility] Please consult [email or web address] for contact info under         Signed, Jerel Balding, MD

## 2023-11-01 NOTE — Progress Notes (Signed)
 PHARMACY - ANTICOAGULATION  Pharmacy Consult for heparin  Indication: chest pain/ACS and afib  Allergies  Allergen Reactions   Ciprofloxacin Itching, Swelling and Other (See Comments)    Possibly causing tremors?   Crestor  [Rosuvastatin ] Other (See Comments)    Myalgia and back pain   Lisinopril  Cough   Ofev  [Nintedanib] Nausea Only   Patient Measurements: Height: 5' 7 (170.2 cm) Weight: 55.2 kg (121 lb 11.2 oz) IBW/kg (Calculated) : 61.6 HEPARIN  DW (KG): 55.2  Vital Signs: Temp: (P) 97.8 F (36.6 C) (10/06 0756) Temp Source: Oral (10/06 0542) BP: 121/70 (10/06 0756) Pulse Rate: 58 (10/06 0756)  Labs: Recent Labs    10/30/23 1050 10/30/23 1250 10/30/23 2240 10/31/23 0312 10/31/23 1438 11/01/23 0805  HGB 11.7*  --   --  10.3*  --  10.4*  HCT 35.5*  --   --  30.8*  --  32.0*  PLT 162  --   --  153  --  153  APTT  --   --  71* 99*  --  106*  HEPARINUNFRC  --   --  >1.10* >1.10*  --  1.04*  CREATININE 0.97  --   --   --   --  0.91  TROPONINIHS 1,158* 1,244*  --   --  869*  --    Estimated Creatinine Clearance: 42.3 mL/min (by C-G formula based on SCr of 0.91 mg/dL).  Assessment: 83 y.o. female presenting with chest pain. Troponins elevated 1,158 > 1,244. Cardiology consulted and planning for [facility] on Monday. Pharmacy consulted to dose heparin  infusion in meantime. Patient also on Eliquis  PTA for aflutter. Last dose reported 10/3 in the AM. 10/4 AM heparin  level elevated > 1.1 likely due to recent Eliquis  use. Therefore, monitoring using aPTT.   -aPTT supratherapeutic at 106 on infusion of 750 units/h.  -plans for cath today  Goal of Therapy:  Heparin  level 0.3-0.7 units/ml aPTT 66-102 seconds Monitor platelets by anticoagulation protocol: Yes   Plan:  -Decrease heparin  to 700 units/hr -Will follow plans post cath  Prentice Poisson, PharmD Clinical Pharmacist **Pharmacist phone directory can now be found on [email or web address] ([email or web address]).  Listed under [facility].

## 2023-11-01 NOTE — Progress Notes (Signed)
  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   Assessment and Plan: NSTEMI -Cards consulted -Continue hep gtt  -continue Cardizem  120mg  daily - Pt underwent for heart cath 10//6 notable for large dominant circumflex w/ moderately severe stenosis at distal vessel involving origin of small L PDA branch, small nondominant RCA w/ prox occlusion w/ collaterals.  -Medical therapy recommended with no targets for PCI noted   HTN -PTA Cardizem  per above -HOLD pta lasix and losartan    Hypothyroid -PTA Synthroid  100mcg daily   SLE -PTA Plaquenil  200mg  daily   H/o stage IIIA (cT2a, N2, M0) adenocarcinoma, non-small cell lung cancer, of the LLL -HOLD pta anastrazole for now      Subjective: Without chest pain this AM  Physical Exam: Vitals:   11/01/23 1252 11/01/23 1310 11/01/23 1325 11/01/23 1514  BP: (!) 142/69 (!) 152/67 132/65 132/63  Pulse: (!) 57 (!) 57 (!) 55 61  Resp: 16   (P) 18  Temp: 97.7 F (36.5 C)     TempSrc: Oral     SpO2: 100% 100% 99% 100%  Weight:      Height:       General exam: Conversant, in no acute distress Respiratory system: normal chest rise, clear, no audible wheezing Cardiovascular system: regular rhythm, s1-s2 Gastrointestinal system: Nondistended, nontender, pos BS Central nervous system: No seizures, no tremors Extremities: No cyanosis, no joint deformities Skin: No rashes, no pallor Psychiatry: Affect normal // no auditory hallucinations   Data Reviewed:  Labs reviewed: Na 132, K 3.8, Cr 0.91, WBC 7.0, Hgb 10.4, Plts 153  Family Communication: Pt in room, family not at  bedside  Disposition: Status is: Inpatient Remains inpatient appropriate because: Severity of illness  Planned Discharge Destination: Home    Author: Garnette Pelt, MD 11/01/2023 4:52 PM  For on call review [email or web address].

## 2023-11-01 NOTE — Plan of Care (Signed)
  Problem: Education: Goal: Knowledge of General Education information will improve Description: Including pain rating scale, medication(s)/side effects and non-pharmacologic comfort measures Outcome: Progressing   Problem: Health Behavior/Discharge Planning: Goal: Ability to manage health-related needs will improve Outcome: Progressing   Problem: Clinical Measurements: Goal: Ability to maintain clinical measurements within normal limits will improve Outcome: Progressing Goal: Will remain free from infection Outcome: Progressing Goal: Diagnostic test results will improve Outcome: Progressing Goal: Respiratory complications will improve Outcome: Progressing Goal: Cardiovascular complication will be avoided Outcome: Progressing   Problem: Activity: Goal: Risk for activity intolerance will decrease Outcome: Progressing   Problem: Nutrition: Goal: Adequate nutrition will be maintained Outcome: Progressing   Problem: Coping: Goal: Level of anxiety will decrease Outcome: Progressing   Problem: Elimination: Goal: Will not experience complications related to bowel motility Outcome: Progressing Goal: Will not experience complications related to urinary retention Outcome: Progressing   Problem: Pain Managment: Goal: General experience of comfort will improve and/or be controlled Outcome: Progressing   Problem: Safety: Goal: Ability to remain free from injury will improve Outcome: Progressing   Problem: Skin Integrity: Goal: Risk for impaired skin integrity will decrease Outcome: Progressing   Problem: Education: Goal: Understanding of cardiac disease, CV risk reduction, and recovery process will improve Outcome: Progressing Goal: Individualized Educational Video(s) Outcome: Progressing   Problem: Activity: Goal: Ability to tolerate increased activity will improve Outcome: Progressing   Problem: Cardiac: Goal: Ability to achieve and maintain adequate cardiovascular  perfusion will improve Outcome: Progressing   Problem: Health Behavior/Discharge Planning: Goal: Ability to safely manage health-related needs after discharge will improve Outcome: Progressing   Problem: Education: Goal: Understanding of CV disease, CV risk reduction, and recovery process will improve Outcome: Progressing Goal: Individualized Educational Video(s) Outcome: Progressing   Problem: Activity: Goal: Ability to return to baseline activity level will improve Outcome: Progressing   Problem: Cardiovascular: Goal: Ability to achieve and maintain adequate cardiovascular perfusion will improve Outcome: Progressing Goal: Vascular access site(s) Level 0-1 will be maintained Outcome: Progressing   Problem: Health Behavior/Discharge Planning: Goal: Ability to safely manage health-related needs after discharge will improve Outcome: Progressing

## 2023-11-01 NOTE — Progress Notes (Signed)
 Site area: R. Fem. Artery 83fr sheath Site Prior to Removal:  Level 0 Pressure Applied For: Manual:   yes Patient Status During Pull:   Post Pull Site:  Level 0 Post Pull Instructions Given:  yes with teachback Post Pull Pulses Present: yes +2 R. DP Dressing Applied:  gauze / transparent dressing Bedrest begins @ 1221 Comments:

## 2023-11-01 NOTE — Progress Notes (Signed)
  Echocardiogram 2D Echocardiogram has been performed.  Tinnie FORBES Gosling RDCS 11/01/2023, 9:26 AM

## 2023-11-01 NOTE — Progress Notes (Signed)
  Progress Note  Patient Name: [name] Date of Encounter: 11/01/2023 [facility] Cardiologist: Gordy Bergamo, MD   Interval Summary   No CP or dyspnea overnight  Vital Signs Vitals:   11/01/23 0043 11/01/23 0542 11/01/23 0553 11/01/23 0756  BP: 115/63  (!) 110/59 121/70  Pulse: 64  (!) 58 (!) 58  Resp: 16   16  Temp: 98.6 F (37 C) 98.2 F (36.8 C)  (P) 97.8 F (36.6 C)  TempSrc: Oral Oral    SpO2: 100%  98% (P) 98%  Weight:      Height:        Intake/Output Summary (Last 24 hours) at 11/01/2023 0844 Last data filed at 11/01/2023 0557 Gross per 24 hour  Intake 1223.31 ml  Output --  Net 1223.31 ml      10/30/2023    3:36 PM 10/30/2023   10:26 AM 10/21/2023   10:54 AM  Last 3 Weights  Weight (lbs) 121 lb 11.2 oz 124 lb 124 lb 3.2 oz  Weight (kg) 55.203 kg 56.246 kg 56.337 kg      Telemetry/ECG  NSR/sinus brady, rare PVCs- Personally Reviewed  Physical Exam  GEN: No acute distress. Sedated   Neck: No JVD Cardiac: RRR, no murmurs, rubs, or gallops.  Respiratory: Clear to auscultation bilaterally. 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  For cardiac catheterization possible percutaneous intervention today.  CT chest (non-gated) shows very high burden of calcified coronary plaque, including and the left coronary artery and proximal LAD artery.  It is not inconceivable that we will find that she has surgical anatomy.  However in view of her  age, pulmonary disease and history of recent chest XRT, percutaneous revascularization may remain the best option, if appropriate.  Echocardiogram pending this morning.  Informed Consent   Shared Decision Making/Informed Consent The risks [stroke (1 in 1000), death (1 in 1000), kidney failure [usually temporary] (1 in 500), bleeding (1 in 200), allergic reaction [possibly serious] (1 in 200)], benefits (diagnostic support and management of coronary artery disease) and alternatives of a cardiac catheterization were discussed in detail with Ms. Delangel and she is willing to proceed.       For questions or updates, please contact [facility] Please consult [email or web address] for contact info under         Signed, Jerel Balding, MD

## 2023-11-02 ENCOUNTER — Other Ambulatory Visit (HOSPITAL_COMMUNITY): Payer: Self-pay

## 2023-11-02 ENCOUNTER — Encounter (HOSPITAL_COMMUNITY): Payer: Self-pay | Admitting: Cardiovascular Disease

## 2023-11-02 DIAGNOSIS — I214 Non-ST elevation (NSTEMI) myocardial infarction: Secondary | ICD-10-CM | POA: Diagnosis not present

## 2023-11-02 LAB — COMPREHENSIVE METABOLIC PANEL WITH GFR
ALT: 15 U/L (ref 0–44)
AST: 26 U/L (ref 15–41)
Albumin: 3.2 g/dL — ABNORMAL LOW (ref 3.5–5.0)
Alkaline Phosphatase: 53 U/L (ref 38–126)
Anion gap: 8 (ref 5–15)
BUN: 13 mg/dL (ref 8–23)
CO2: 22 mmol/L (ref 22–32)
Calcium: 9 mg/dL (ref 8.9–10.3)
Chloride: 99 mmol/L (ref 98–111)
Creatinine, Ser: 0.92 mg/dL (ref 0.44–1.00)
GFR, Estimated: >60 mL/min (ref >60)
Glucose, Bld: 96 mg/dL (ref 70–99)
Potassium: 4.1 mmol/L (ref 3.5–5.1)
Sodium: 129 mmol/L — ABNORMAL LOW (ref 135–145)
Total Bilirubin: 0.6 mg/dL (ref 0.0–1.2)
Total Protein: 6.9 g/dL (ref 6.5–8.1)

## 2023-11-02 LAB — LIPID PANEL
Cholesterol: 156 mg/dL (ref 0–200)
HDL: 47 mg/dL (ref >40)
LDL Cholesterol: 90 mg/dL (ref 0–99)
Total CHOL/HDL Ratio: 3.3 ratio
Triglycerides: 93 mg/dL (ref <150)
VLDL: 19 mg/dL (ref 0–40)

## 2023-11-02 LAB — CBC
HCT: 30.4 % — ABNORMAL LOW (ref 36.0–46.0)
Hemoglobin: 9.9 g/dL — ABNORMAL LOW (ref 12.0–15.0)
MCH: 28.3 pg (ref 26.0–34.0)
MCHC: 32.6 g/dL (ref 30.0–36.0)
MCV: 86.9 fL (ref 80.0–100.0)
Platelets: 128 K/uL — ABNORMAL LOW (ref 150–400)
RBC: 3.5 MIL/uL — ABNORMAL LOW (ref 3.87–5.11)
RDW: 17 % — ABNORMAL HIGH (ref 11.5–15.5)
WBC: 6.2 K/uL (ref 4.0–10.5)
nRBC: 0 % (ref 0.0–0.2)

## 2023-11-02 MED ORDER — ATORVASTATIN CALCIUM 40 MG PO TABS
40.0000 mg | ORAL_TABLET | Freq: Every day | ORAL | 0 refills | Status: DC
  Filled 2023-11-02: qty 30, 30d supply, fill #0

## 2023-11-02 MED FILL — Verapamil HCl IV Soln 2.5 MG/ML: INTRAVENOUS | Qty: 2 | Status: AC

## 2023-11-02 NOTE — Plan of Care (Signed)
 Problem: Education: Goal: Knowledge of General Education information will improve Description: Including pain rating scale, medication(s)/side effects and non-pharmacologic comfort measures 11/02/2023 1255 by Marylu Randine NOVAK, RN Outcome: Adequate for Discharge 11/02/2023 1255 by Marylu Randine NOVAK, RN Outcome: Progressing 11/02/2023 1255 by Marylu Randine NOVAK, RN Outcome: Adequate for Discharge   Problem: Health Behavior/Discharge Planning: Goal: Ability to manage health-related needs will improve 11/02/2023 1255 by Marylu Randine NOVAK, RN Outcome: Adequate for Discharge 11/02/2023 1255 by Marylu Randine NOVAK, RN Outcome: Progressing 11/02/2023 1255 by Marylu Randine NOVAK, RN Outcome: Adequate for Discharge   Problem: Clinical Measurements: Goal: Ability to maintain clinical measurements within normal limits will improve 11/02/2023 1255 by Marylu Randine NOVAK, RN Outcome: Adequate for Discharge 11/02/2023 1255 by Marylu Randine NOVAK, RN Outcome: Progressing 11/02/2023 1255 by Marylu Randine NOVAK, RN Outcome: Adequate for Discharge Goal: Will remain free from infection 11/02/2023 1255 by Marylu Randine NOVAK, RN Outcome: Adequate for Discharge 11/02/2023 1255 by Marylu Randine NOVAK, RN Outcome: Progressing 11/02/2023 1255 by Marylu Randine NOVAK, RN Outcome: Adequate for Discharge Goal: Diagnostic test results will improve 11/02/2023 1255 by Marylu Randine NOVAK, RN Outcome: Adequate for Discharge 11/02/2023 1255 by Marylu Randine NOVAK, RN Outcome: Progressing 11/02/2023 1255 by Marylu Randine NOVAK, RN Outcome: Adequate for Discharge Goal: Respiratory complications will improve 11/02/2023 1255 by Marylu Randine NOVAK, RN Outcome: Adequate for Discharge 11/02/2023 1255 by Marylu Randine NOVAK, RN Outcome: Progressing 11/02/2023 1255 by Marylu Randine NOVAK, RN Outcome: Adequate for Discharge Goal: Cardiovascular complication will be avoided 11/02/2023 1255 by Marylu Randine NOVAK, RN Outcome: Adequate for Discharge 11/02/2023 1255 by Marylu Randine NOVAK,  RN Outcome: Progressing 11/02/2023 1255 by Marylu Randine NOVAK, RN Outcome: Adequate for Discharge   Problem: Activity: Goal: Risk for activity intolerance will decrease 11/02/2023 1255 by Marylu Randine NOVAK, RN Outcome: Adequate for Discharge 11/02/2023 1255 by Marylu Randine NOVAK, RN Outcome: Progressing 11/02/2023 1255 by Marylu Randine NOVAK, RN Outcome: Adequate for Discharge   Problem: Nutrition: Goal: Adequate nutrition will be maintained 11/02/2023 1255 by Marylu Randine NOVAK, RN Outcome: Adequate for Discharge 11/02/2023 1255 by Marylu Randine NOVAK, RN Outcome: Progressing 11/02/2023 1255 by Marylu Randine NOVAK, RN Outcome: Adequate for Discharge   Problem: Coping: Goal: Level of anxiety will decrease 11/02/2023 1255 by Marylu Randine NOVAK, RN Outcome: Adequate for Discharge 11/02/2023 1255 by Marylu Randine NOVAK, RN Outcome: Progressing 11/02/2023 1255 by Marylu Randine NOVAK, RN Outcome: Adequate for Discharge   Problem: Elimination: Goal: Will not experience complications related to bowel motility 11/02/2023 1255 by Marylu Randine NOVAK, RN Outcome: Adequate for Discharge 11/02/2023 1255 by Marylu Randine NOVAK, RN Outcome: Progressing 11/02/2023 1255 by Marylu Randine NOVAK, RN Outcome: Adequate for Discharge Goal: Will not experience complications related to urinary retention 11/02/2023 1255 by Marylu Randine NOVAK, RN Outcome: Adequate for Discharge 11/02/2023 1255 by Marylu Randine NOVAK, RN Outcome: Progressing 11/02/2023 1255 by Marylu Randine NOVAK, RN Outcome: Adequate for Discharge   Problem: Pain Managment: Goal: General experience of comfort will improve and/or be controlled 11/02/2023 1255 by Marylu Randine NOVAK, RN Outcome: Adequate for Discharge 11/02/2023 1255 by Marylu Randine NOVAK, RN Outcome: Progressing 11/02/2023 1255 by Marylu Randine NOVAK, RN Outcome: Adequate for Discharge   Problem: Safety: Goal: Ability to remain free from injury will improve 11/02/2023 1255 by Marylu Randine NOVAK, RN Outcome: Adequate for  Discharge 11/02/2023 1255 by Marylu Randine NOVAK, RN Outcome: Progressing 11/02/2023 1255 by Marylu Randine NOVAK, RN Outcome: Adequate for Discharge   Problem: Skin Integrity: Goal: Risk for impaired  skin integrity will decrease 11/02/2023 1255 by Marylu Randine NOVAK, RN Outcome: Adequate for Discharge 11/02/2023 1255 by Marylu Randine NOVAK, RN Outcome: Progressing 11/02/2023 1255 by Marylu Randine NOVAK, RN Outcome: Adequate for Discharge   Problem: Education: Goal: Understanding of cardiac disease, CV risk reduction, and recovery process will improve 11/02/2023 1255 by Marylu Randine NOVAK, RN Outcome: Adequate for Discharge 11/02/2023 1255 by Marylu Randine NOVAK, RN Outcome: Progressing 11/02/2023 1255 by Marylu Randine NOVAK, RN Outcome: Adequate for Discharge Goal: Individualized Educational Video(s) 11/02/2023 1255 by Marylu Randine NOVAK, RN Outcome: Adequate for Discharge 11/02/2023 1255 by Marylu Randine NOVAK, RN Outcome: Progressing 11/02/2023 1255 by Marylu Randine NOVAK, RN Outcome: Adequate for Discharge   Problem: Activity: Goal: Ability to tolerate increased activity will improve 11/02/2023 1255 by Marylu Randine NOVAK, RN Outcome: Adequate for Discharge 11/02/2023 1255 by Marylu Randine NOVAK, RN Outcome: Progressing 11/02/2023 1255 by Marylu Randine NOVAK, RN Outcome: Adequate for Discharge   Problem: Cardiac: Goal: Ability to achieve and maintain adequate cardiovascular perfusion will improve 11/02/2023 1255 by Marylu Randine NOVAK, RN Outcome: Adequate for Discharge 11/02/2023 1255 by Marylu Randine NOVAK, RN Outcome: Progressing 11/02/2023 1255 by Marylu Randine NOVAK, RN Outcome: Adequate for Discharge   Problem: Health Behavior/Discharge Planning: Goal: Ability to safely manage health-related needs after discharge will improve 11/02/2023 1255 by Marylu Randine NOVAK, RN Outcome: Adequate for Discharge 11/02/2023 1255 by Marylu Randine NOVAK, RN Outcome: Progressing 11/02/2023 1255 by Marylu Randine NOVAK, RN Outcome: Adequate for Discharge    Problem: Education: Goal: Understanding of CV disease, CV risk reduction, and recovery process will improve 11/02/2023 1255 by Marylu Randine NOVAK, RN Outcome: Adequate for Discharge 11/02/2023 1255 by Marylu Randine NOVAK, RN Outcome: Progressing 11/02/2023 1255 by Marylu Randine NOVAK, RN Outcome: Adequate for Discharge Goal: Individualized Educational Video(s) 11/02/2023 1255 by Marylu Randine NOVAK, RN Outcome: Adequate for Discharge 11/02/2023 1255 by Marylu Randine NOVAK, RN Outcome: Progressing 11/02/2023 1255 by Marylu Randine NOVAK, RN Outcome: Adequate for Discharge   Problem: Activity: Goal: Ability to return to baseline activity level will improve 11/02/2023 1255 by Marylu Randine NOVAK, RN Outcome: Adequate for Discharge 11/02/2023 1255 by Marylu Randine NOVAK, RN Outcome: Progressing 11/02/2023 1255 by Marylu Randine NOVAK, RN Outcome: Adequate for Discharge   Problem: Cardiovascular: Goal: Ability to achieve and maintain adequate cardiovascular perfusion will improve 11/02/2023 1255 by Marylu Randine NOVAK, RN Outcome: Adequate for Discharge 11/02/2023 1255 by Marylu Randine NOVAK, RN Outcome: Progressing 11/02/2023 1255 by Marylu Randine NOVAK, RN Outcome: Adequate for Discharge Goal: Vascular access site(s) Level 0-1 will be maintained 11/02/2023 1255 by Marylu Randine NOVAK, RN Outcome: Adequate for Discharge 11/02/2023 1255 by Marylu Randine NOVAK, RN Outcome: Progressing 11/02/2023 1255 by Marylu Randine NOVAK, RN Outcome: Adequate for Discharge   Problem: Health Behavior/Discharge Planning: Goal: Ability to safely manage health-related needs after discharge will improve 11/02/2023 1255 by Marylu Randine NOVAK, RN Outcome: Adequate for Discharge 11/02/2023 1255 by Marylu Randine NOVAK, RN Outcome: Progressing 11/02/2023 1255 by Marylu Randine NOVAK, RN Outcome: Adequate for Discharge

## 2023-11-02 NOTE — Progress Notes (Signed)
  Progress Note  Patient Name: [name] Date of Encounter: 11/02/2023 [facility] Cardiologist: Gordy Bergamo, MD   Interval Summary   No chest pain overnight.  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 Problems at right femoral artery access site.  Vital Signs Vitals:   11/01/23 2300 11/02/23 0009 11/02/23 0400 11/02/23 0829  BP: 131/66 131/66 124/66 (!) 148/67  Pulse: 73 71 67 70  Resp: 17 17 16 17   Temp: 98.1 F (36.7 C) 98.1 F (36.7 C) 98.2 F (36.8 C) 98.1 F (36.7 C)  TempSrc: Oral Oral Oral Oral  SpO2: 100% 100% 99% 99%  Weight:      Height:        Intake/Output Summary (Last 24 hours) at 11/02/2023 0935 Last data filed at 11/01/2023 2112 Gross per 24 hour  Intake 503 ml  Output --  Net 503 ml      10/30/2023    3:36 PM 10/30/2023   10:26 AM 10/21/2023   10:54 AM  Last 3 Weights  Weight (lbs) 121 lb 11.2 oz 124 lb 124 lb 3.2 oz  Weight (kg) 55.203 kg 56.246 kg 56.337 kg      Telemetry/ECG  A single 10 beat episode of nonsustained wide-complex tachycardia around 1700 hrs. yesterday, otherwise rare PVCs on a background of normal sinus rhythm - Personally Reviewed  Physical Exam  GEN: No acute distress.   Neck: No JVD Cardiac: RRR, no murmurs, rubs, or gallops.  Respiratory: Clear to auscultation bilaterally. 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  The echocardiogram shows preserved left ventricular systolic function and no evidence of volume overload.  Borderline systolic PA pressure.  [facility] will sign off.   Medication Recommendations:   Atorvastatin  40 mg once daily Eliquis  5 mg twice daily Clopidogrel 75 mg daily for 1 month only Diltiazem  sustained-release 120 mg once daily Losartan -hydrochlorothiazide  50-12.5 mg once daily Spironolactone  12.5 mg daily Other recommendations (labs, testing, etc): Stop clopidogrel after 30 days due to high bleeding risk.  Repeat lipid profile and 2-3 months. Follow up as an outpatient: Has appointment scheduled 11/16/2023 with Lum Louis, NP  [facility] will sign off.   The patient is ready for discharge today from a cardiac standpoint.  For questions or updates, please contact [facility] Please consult [email or web address] for contact info under         Signed, Jerel Balding, MD

## 2023-11-02 NOTE — Discharge Summary (Addendum)
 Physician Discharge Summary   Patient: [name] MRN: [id number] DOB: [birth year]  Admit date:     10/30/2023  Discharge date: 11/02/23  Discharge Physician: Garnette Pelt   PCP: [name], MD   Recommendations at discharge:    Follow up with PCP in 1-2 weeks Follow up with Cardiology as scheduled  Discharge Diagnoses: Principal Problem:   NSTEMI (non-ST elevated myocardial infarction) (HCC)  Resolved Problems:   * No resolved hospital problems. 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   Assessment and Plan: NSTEMI -Cards consulted -was initially continued on hep gtt. To resume eliquis  on d/c -continue Cardizem  120mg  daily - Pt underwent for heart cath 10//6 notable for large dominant circumflex w/ moderately severe stenosis at distal vessel involving origin of small L PDA branch, small nondominant RCA w/ prox occlusion w/ collaterals.  -Medical therapy recommended with no targets for PCI noted   HTN -PTA Cardizem  per above -to continue on losartan -hydrochlorothiazide , spironolactone  on d/c   Hypothyroid -PTA Synthroid  100mcg daily   SLE -PTA Plaquenil  200mg  daily   H/o stage IIIA (cT2a, N2, M0) adenocarcinoma, non-small cell lung cancer, of the LLL -continue home regimen on d/c       Consultants: Cardiology Procedures performed: Heart cath  Disposition: Home Diet recommendation:  Cardiac and Carb modified diet DISCHARGE MEDICATION: Allergies as of 11/02/2023       Reactions   Ciprofloxacin Itching, Swelling, Other (See Comments)   Possibly causing tremors?   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   albuterol  0.63 MG/3ML nebulizer solution Commonly known as: ACCUNEB  Take 3 mLs (0.63 mg total) by nebulization every 6 (six) hours as needed for wheezing.   anastrozole  1 MG tablet Commonly known as: ARIMIDEX  Take 1 tablet (1 mg total) by mouth daily.   apixaban  5 MG Tabs tablet Commonly known as: Eliquis  Take 1 tablet (5 mg total) by mouth 2 (two) times daily. Okay to restart on 06/30/2023   atorvastatin  40 MG tablet Commonly known as: LIPITOR Take 1 tablet (40 mg total) by mouth daily. Start taking on: November 03, 2023   Combivent Respimat 20-100 MCG/ACT Aers respimat Generic drug: Ipratropium-Albuterol  Inhale 1 puff into the lungs every 6 (six) hours as needed for shortness of breath.   diclofenac  Sodium 1 % Gel Commonly known as: VOLTAREN  Apply 1 application. topically  4 (four) times daily as needed (pain).   diltiazem  120 MG 24 hr capsule Commonly known as: Cardizem  CD Take 1 capsule (120 mg total) by mouth daily.   gabapentin  300 MG capsule Commonly known as: NEURONTIN  Take 300 mg by mouth 3 (three) times daily.   glimepiride  1 MG tablet Commonly known as: AMARYL  Take 1 mg by mouth daily with breakfast.    hydroxychloroquine  200 MG tablet Commonly known as: PLAQUENIL  Take 200 mg by mouth daily.   levothyroxine  100 MCG tablet Commonly known as: SYNTHROID  Take 100 mcg by mouth daily.   losartan -hydrochlorothiazide  50-12.5 MG tablet Commonly known as: HYZAAR Take 1 tablet by mouth daily.   omeprazole 20 MG capsule Commonly known as: PRILOSEC Take 20 mg by mouth daily as needed (Indigestion).   spironolactone  25 MG tablet Commonly known as: ALDACTONE  Take 0.5 tablets (12.5 mg total) by mouth daily.               Durable Medical Equipment  (From admission, onward)           Start     Ordered   10/31/23 1512  For home use only DME 4 wheeled rolling walker with seat  Once       Question:  Patient needs a walker to treat with the following condition  Answer:  Weakness   10/31/23 1511            Follow-up Information     [facility] [employer] Follow up.   Why: [employer] 3.456 Google reviews Dentist in [place], [employer] Directions Reviews Save Share Call  Address: [address] [place], [place], [place] [address] Get There: 14 min  5 min Phone: [phone] Hours:  Closed  Opens 8:30 AM Mon Suggest an edit  Own this business?        Rana Lum CROME, NP Follow up.   Specialty: Cardiology Why: [facility] - [address] office - cardiology follow-up with nurse practitioner Lum Rana on Tuesday Nov 16, 2023 8:25 AM (Arrive by 8:05 AM) Contact information: [address] [place] [address] [phone]         [name], MD Follow up in 2 week(s).   Specialty: Family Medicine Why: Hospital follow up Contact information: [address] [place] [address] [phone]                Discharge Exam: Fredricka Weights   10/30/23 1026 10/30/23 1536  Weight: 56.2 kg 55.2 kg   General exam: Awake, laying in bed, in nad Respiratory  system: Normal respiratory effort, no wheezing Cardiovascular system: regular rate, s1, s2 Gastrointestinal system: Soft, nondistended, positive BS Central nervous system: CN2-12 grossly intact, strength intact Extremities: Perfused, no clubbing Skin: Normal skin turgor, no notable skin lesions seen Psychiatry: Mood normal // no visual hallucinations   Condition at discharge: fair  The results of significant diagnostics from this hospitalization (including imaging, microbiology, ancillary and laboratory) are listed below for reference.   Imaging Studies: ECHOCARDIOGRAM COMPLETE Result Date: 11/01/2023    ECHOCARDIOGRAM REPORT   Patient Name:   MINSA WEDDINGTON Date of Exam: 11/01/2023 Medical Rec #:  [id number]      Height:       67.0 in Accession #:    [id number]     Weight:       121.7 lb Date of Birth:  [birth year]       BSA:  1.637 m Patient Age:    83 years       BP:           121/70 mmHg Patient Gender: F              HR:           54 bpm. Exam Location:  Inpatient Procedure: 2D Echo, Color Doppler and Cardiac Doppler (Both Spectral and Color            Flow Doppler were utilized during procedure). Indications:    NSTEMI I21.4  History:        Patient has prior history of Echocardiogram examinations, most                 recent 11/01/2023. Risk Factors:Diabetes and Hypertension.  Sonographer:    Tinnie Gosling RDCS Referring Phys: 16 RHONDA G BARRETT  Sonographer Comments: Image acquisition challenging due to respiratory motion. IMPRESSIONS  1. Left ventricular ejection fraction, by estimation, is 60 to 65%. The left ventricle has normal function. The left ventricle has no regional wall motion abnormalities. Left ventricular diastolic parameters are consistent with Grade I diastolic dysfunction (impaired relaxation).  2. Right ventricular systolic function is normal. The right ventricular size is normal. There is normal pulmonary artery systolic pressure. The estimated right ventricular  systolic pressure is 33.7 mmHg.  3. The mitral valve is degenerative. No evidence of mitral valve regurgitation. No evidence of mitral stenosis.  4. The aortic valve is calcified. There is mild calcification of the aortic valve. Aortic valve regurgitation is trivial. Aortic valve sclerosis is present, with no evidence of aortic valve stenosis.  5. The inferior vena cava is normal in size with greater than 50% respiratory variability, suggesting right atrial pressure of 3 mmHg. FINDINGS  Left Ventricle: Left ventricular ejection fraction, by estimation, is 60 to 65%. The left ventricle has normal function. The left ventricle has no regional wall motion abnormalities. The left ventricular internal cavity size was normal in size. There is  no left ventricular hypertrophy. Left ventricular diastolic parameters are consistent with Grade I diastolic dysfunction (impaired relaxation). Right Ventricle: The right ventricular size is normal. No increase in right ventricular wall thickness. Right ventricular systolic function is normal. There is normal pulmonary artery systolic pressure. The tricuspid regurgitant velocity is 2.77 m/s, and  with an assumed right atrial pressure of 3 mmHg, the estimated right ventricular systolic pressure is 33.7 mmHg. Left Atrium: Left atrial size was normal in size. Right Atrium: Right atrial size was normal in size. Pericardium: There is no evidence of pericardial effusion. Mitral Valve: The mitral valve is degenerative in appearance. There is mild thickening of the mitral valve leaflet(s). There is mild calcification of the mitral valve leaflet(s). No evidence of mitral valve regurgitation. No evidence of mitral valve stenosis. Tricuspid Valve: The tricuspid valve is normal in structure. Tricuspid valve regurgitation is trivial. No evidence of tricuspid stenosis. Aortic Valve: The aortic valve is calcified. There is mild calcification of the aortic valve. Aortic valve regurgitation is  trivial. Aortic valve sclerosis is present, with no evidence of aortic valve stenosis. Pulmonic Valve: The pulmonic valve was normal in structure. Pulmonic valve regurgitation is not visualized. No evidence of pulmonic stenosis. Aorta: The aortic root is normal in size and structure. Venous: The inferior vena cava is normal in size with greater than 50% respiratory variability, suggesting right atrial pressure of 3 mmHg. IAS/Shunts: No atrial level shunt detected by color flow Doppler.  LEFT VENTRICLE  PLAX 2D LVIDd:         4.20 cm   Diastology LVIDs:         2.10 cm   LV e' medial:    4.35 cm/s LV PW:         1.10 cm   LV E/e' medial:  13.5 LV IVS:        1.00 cm   LV e' lateral:   5.11 cm/s LVOT diam:     2.00 cm   LV E/e' lateral: 11.5 LV SV:         50 LV SV Index:   31 LVOT Area:     3.14 cm  RIGHT VENTRICLE             IVC RV S prime:     10.20 cm/s  IVC diam: 1.60 cm TAPSE (M-mode): 1.1 cm LEFT ATRIUM             Index LA diam:        2.70 cm 1.65 cm/m LA Vol (A2C):   33.4 ml 20.40 ml/m LA Vol (A4C):   31.5 ml 19.24 ml/m LA Biplane Vol: 34.9 ml 21.31 ml/m  AORTIC VALVE             PULMONIC VALVE LVOT Vmax:   62.50 cm/s  PR End Diast Vel: 5.02 msec LVOT Vmean:  41.200 cm/s LVOT VTI:    0.159 m  AORTA Ao Root diam: 3.20 cm Ao Asc diam:  3.30 cm MITRAL VALVE                TRICUSPID VALVE MV Area (PHT): 1.93 cm     TR Peak grad:   30.7 mmHg MV Decel Time: 394 msec     TR Vmax:        277.00 cm/s MV E velocity: 58.80 cm/s MV A velocity: 107.00 cm/s  SHUNTS MV E/A ratio:  0.55         Systemic VTI:  0.16 m                             Systemic Diam: 2.00 cm Oneil Parchment MD Electronically signed by Oneil Parchment MD Signature Date/Time: 11/01/2023/1:29:48 PM    Final    CARDIAC CATHETERIZATION Result Date: 11/01/2023 1.  Patent left main with mild calcified plaquing 2.  Patent LAD with diffuse nonobstructive calcified plaquing 3.  Large, dominant circumflex with moderately severe stenosis at the distal vessel  involving the origin of a small left PDA branch 4.  Small, nondominant RCA with proximal occlusion and left-to-right collaterals supplied by the distal circumflex 5.  Normal LVEDP Recommendations: Medical therapy.  No targets for PCI.   DG Chest 2 View Result Date: 10/30/2023 CLINICAL DATA:  Productive cough. EXAM: CHEST - 2 VIEW COMPARISON:  10/21/2023 FINDINGS: Lungs are hyperexpanded. Bibasilar chronic atelectasis or scarring is similar to prior. Superimposed lesion at the left base compatible with known hypermetabolic lesion seen on PET-CT 07/15/2023. No focal airspace consolidation, pneumothorax, or substantial pleural effusion. Cardiopericardial silhouette is at upper limits of normal for size. No acute bony abnormality. Telemetry leads overlie the chest. IMPRESSION: Hyperexpansion with bibasilar chronic atelectasis or scarring. No acute cardiopulmonary findings. Known hypermetabolic lesion left lower lobe better characterized on previous PET imaging. Electronically Signed   By: Camellia Candle M.D.   On: 10/30/2023 11:47   DG Chest 2 View Result Date: 10/26/2023 CLINICAL DATA:  Lung cancer EXAM: CHEST -  2 VIEW COMPARISON:  Chest radiograph 10/19/2023 and 09/28/2023 FINDINGS: Cardiomediastinal silhouette and pulmonary vasculature are within normal limits. Lungs are hyperexpanded. Known LEFT basilar malignancy does not appear significantly changed. Linear opacities the RIGHT lung base are unchanged and likely due to scarring. No new focal airspace opacity identified to indicate pneumonia. IMPRESSION: 1. No focal airspace opacity to indicate pneumonia. 2. Known LEFT basilar malignancy is similar in appearance compared to recent chest radiographs. Electronically Signed   By: Aliene Lloyd M.D.   On: 10/26/2023 13:36   DG Chest 2 View Result Date: 10/19/2023 CLINICAL DATA:  Cough.  History of lung cancer. EXAM: CHEST - 2 VIEW COMPARISON:  Multiple prior imaging studies. The most recent chest x-rays  09/28/2023. FINDINGS: The cardiac silhouette, mediastinal and hilar contours are within normal limits and stable. Stable tortuosity and calcification of the thoracic aorta. Ill-defined opacity at the left lung base but not as pronounced on the prior study which may represent treatment response. Underlying emphysematous changes and pulmonary scarring. No pulmonary edema or pleural effusion. Improved right basilar aeration suggesting resolving infiltrates. No significant bony findings. IMPRESSION: 1. Ill-defined opacity at the left lung base but not as pronounced on the prior study which may represent treatment response. 2. Improved right basilar aeration suggesting resolving infiltrate. Electronically Signed   By: MYRTIS Stammer M.D.   On: 10/19/2023 16:11    Microbiology: Results for orders placed or performed during the hospital encounter of 10/19/23  Urine Culture     Status: Abnormal   Collection Time: 10/19/23  4:00 PM   Specimen: Urine, Clean Catch  Result Value Ref Range Status   Specimen Description URINE, CLEAN CATCH  Final   Special Requests   Final    NONE Performed at [facility], [address], [place], [place] [address]    Culture >=100,000 COLONIES/mL ESCHERICHIA COLI (A)  Final   Report Status 10/21/2023 FINAL  Final   Organism ID, Bacteria ESCHERICHIA COLI (A)  Final      Susceptibility   Escherichia coli - MIC*    AMPICILLIN >=32 RESISTANT Resistant     CEFAZOLIN  (URINE) Value in next row Sensitive      <=1 SENSITIVEThis is a modified FDA-approved test that has been validated and its performance characteristics determined by the reporting laboratory.  This laboratory is certified under the Clinical Laboratory Improvement Amendments CLIA as qualified to perform high complexity clinical laboratory testing.    CEFEPIME Value in next row Sensitive      <=1 SENSITIVEThis is a modified FDA-approved test that has been validated and its performance characteristics determined by  the reporting laboratory.  This laboratory is certified under the Clinical Laboratory Improvement Amendments CLIA as qualified to perform high complexity clinical laboratory testing.    ERTAPENEM Value in next row Sensitive      <=1 SENSITIVEThis is a modified FDA-approved test that has been validated and its performance characteristics determined by the reporting laboratory.  This laboratory is certified under the Clinical Laboratory Improvement Amendments CLIA as qualified to perform high complexity clinical laboratory testing.    CEFTRIAXONE  Value in next row Sensitive      <=1 SENSITIVEThis is a modified FDA-approved test that has been validated and its performance characteristics determined by the reporting laboratory.  This laboratory is certified under the Clinical Laboratory Improvement Amendments CLIA as qualified to perform high complexity clinical laboratory testing.    CIPROFLOXACIN Value in next row Sensitive      <=1 SENSITIVEThis  is a modified FDA-approved test that has been validated and its performance characteristics determined by the reporting laboratory.  This laboratory is certified under the Clinical Laboratory Improvement Amendments CLIA as qualified to perform high complexity clinical laboratory testing.    GENTAMICIN Value in next row Sensitive      <=1 SENSITIVEThis is a modified FDA-approved test that has been validated and its performance characteristics determined by the reporting laboratory.  This laboratory is certified under the Clinical Laboratory Improvement Amendments CLIA as qualified to perform high complexity clinical laboratory testing.    NITROFURANTOIN Value in next row Sensitive      <=1 SENSITIVEThis is a modified FDA-approved test that has been validated and its performance characteristics determined by the reporting laboratory.  This laboratory is certified under the Clinical Laboratory Improvement Amendments CLIA as qualified to perform high complexity clinical  laboratory testing.    TRIMETH/SULFA Value in next row Sensitive      <=1 SENSITIVEThis is a modified FDA-approved test that has been validated and its performance characteristics determined by the reporting laboratory.  This laboratory is certified under the Clinical Laboratory Improvement Amendments CLIA as qualified to perform high complexity clinical laboratory testing.    AMPICILLIN/SULBACTAM Value in next row Sensitive      <=1 SENSITIVEThis is a modified FDA-approved test that has been validated and its performance characteristics determined by the reporting laboratory.  This laboratory is certified under the Clinical Laboratory Improvement Amendments CLIA as qualified to perform high complexity clinical laboratory testing.    PIP/TAZO Value in next row Sensitive      <=4 SENSITIVEThis is a modified FDA-approved test that has been validated and its performance characteristics determined by the reporting laboratory.  This laboratory is certified under the Clinical Laboratory Improvement Amendments CLIA as qualified to perform high complexity clinical laboratory testing.    MEROPENEM Value in next row Sensitive      <=4 SENSITIVEThis is a modified FDA-approved test that has been validated and its performance characteristics determined by the reporting laboratory.  This laboratory is certified under the Clinical Laboratory Improvement Amendments CLIA as qualified to perform high complexity clinical laboratory testing.    * >=100,000 COLONIES/mL ESCHERICHIA COLI    Labs: CBC: Recent Labs  Lab 10/30/23 1050 10/31/23 0312 11/01/23 0805 11/02/23 0517  WBC 8.0 6.7 7.0 6.2  HGB 11.7* 10.3* 10.4* 9.9*  HCT 35.5* 30.8* 32.0* 30.4*  MCV 87.4 84.6 87.7 86.9  PLT 162 153 153 128*   Basic Metabolic Panel: Recent Labs  Lab 10/30/23 1050 11/01/23 0805 11/02/23 0517  NA 131* 132* 129*  K 4.7 3.8 4.1  CL 99 98 99  CO2 22 22 22   GLUCOSE 123* 94 96  BUN 14 15 13   CREATININE 0.97 0.91 0.92   CALCIUM  9.0 9.0 9.0   Liver Function Tests: Recent Labs  Lab 11/01/23 0805 11/02/23 0517  AST 22 26  ALT 15 15  ALKPHOS 49 53  BILITOT 0.4 0.6  PROT 6.9 6.9  ALBUMIN 3.1* 3.2*   CBG: No results for input(s): GLUCAP in the last 168 hours.  Discharge time spent: less than 30 minutes.  Signed: Garnette Pelt, MD [facility] 11/02/2023

## 2023-11-02 NOTE — Care Management Important Message (Signed)
 Important Message  Patient Details  Name: [name] MRN: [id number] Date of Birth: [birth year]   Important Message Given:  Yes - Medicare IM     Vonzell Arrie Sharps 11/02/2023, 11:18 AM

## 2023-11-02 NOTE — Plan of Care (Signed)
 Problem: Education: Goal: Knowledge of General Education information will improve Description: Including pain rating scale, medication(s)/side effects and non-pharmacologic comfort measures 11/02/2023 1255 by Marylu Randine NOVAK, RN Outcome: Progressing 11/02/2023 1255 by Marylu Randine NOVAK, RN Outcome: Adequate for Discharge   Problem: Health Behavior/Discharge Planning: Goal: Ability to manage health-related needs will improve 11/02/2023 1255 by Marylu Randine NOVAK, RN Outcome: Progressing 11/02/2023 1255 by Marylu Randine NOVAK, RN Outcome: Adequate for Discharge   Problem: Clinical Measurements: Goal: Ability to maintain clinical measurements within normal limits will improve 11/02/2023 1255 by Marylu Randine NOVAK, RN Outcome: Progressing 11/02/2023 1255 by Marylu Randine NOVAK, RN Outcome: Adequate for Discharge Goal: Will remain free from infection 11/02/2023 1255 by Marylu Randine NOVAK, RN Outcome: Progressing 11/02/2023 1255 by Marylu Randine NOVAK, RN Outcome: Adequate for Discharge Goal: Diagnostic test results will improve 11/02/2023 1255 by Marylu Randine NOVAK, RN Outcome: Progressing 11/02/2023 1255 by Marylu Randine NOVAK, RN Outcome: Adequate for Discharge Goal: Respiratory complications will improve 11/02/2023 1255 by Marylu Randine NOVAK, RN Outcome: Progressing 11/02/2023 1255 by Marylu Randine NOVAK, RN Outcome: Adequate for Discharge Goal: Cardiovascular complication will be avoided 11/02/2023 1255 by Marylu Randine NOVAK, RN Outcome: Progressing 11/02/2023 1255 by Marylu Randine NOVAK, RN Outcome: Adequate for Discharge   Problem: Activity: Goal: Risk for activity intolerance will decrease 11/02/2023 1255 by Marylu Randine NOVAK, RN Outcome: Progressing 11/02/2023 1255 by Marylu Randine NOVAK, RN Outcome: Adequate for Discharge   Problem: Nutrition: Goal: Adequate nutrition will be maintained 11/02/2023 1255 by Marylu Randine NOVAK, RN Outcome: Progressing 11/02/2023 1255 by Marylu Randine NOVAK, RN Outcome: Adequate for Discharge    Problem: Coping: Goal: Level of anxiety will decrease 11/02/2023 1255 by Marylu Randine NOVAK, RN Outcome: Progressing 11/02/2023 1255 by Marylu Randine NOVAK, RN Outcome: Adequate for Discharge   Problem: Elimination: Goal: Will not experience complications related to bowel motility 11/02/2023 1255 by Marylu Randine NOVAK, RN Outcome: Progressing 11/02/2023 1255 by Marylu Randine NOVAK, RN Outcome: Adequate for Discharge Goal: Will not experience complications related to urinary retention 11/02/2023 1255 by Marylu Randine NOVAK, RN Outcome: Progressing 11/02/2023 1255 by Marylu Randine NOVAK, RN Outcome: Adequate for Discharge   Problem: Pain Managment: Goal: General experience of comfort will improve and/or be controlled 11/02/2023 1255 by Marylu Randine NOVAK, RN Outcome: Progressing 11/02/2023 1255 by Marylu Randine NOVAK, RN Outcome: Adequate for Discharge   Problem: Safety: Goal: Ability to remain free from injury will improve 11/02/2023 1255 by Marylu Randine NOVAK, RN Outcome: Progressing 11/02/2023 1255 by Marylu Randine NOVAK, RN Outcome: Adequate for Discharge   Problem: Skin Integrity: Goal: Risk for impaired skin integrity will decrease 11/02/2023 1255 by Marylu Randine NOVAK, RN Outcome: Progressing 11/02/2023 1255 by Marylu Randine NOVAK, RN Outcome: Adequate for Discharge   Problem: Education: Goal: Understanding of cardiac disease, CV risk reduction, and recovery process will improve 11/02/2023 1255 by Marylu Randine NOVAK, RN Outcome: Progressing 11/02/2023 1255 by Marylu Randine NOVAK, RN Outcome: Adequate for Discharge Goal: Individualized Educational Video(s) 11/02/2023 1255 by Marylu Randine NOVAK, RN Outcome: Progressing 11/02/2023 1255 by Marylu Randine NOVAK, RN Outcome: Adequate for Discharge   Problem: Activity: Goal: Ability to tolerate increased activity will improve 11/02/2023 1255 by Marylu Randine NOVAK, RN Outcome: Progressing 11/02/2023 1255 by Marylu Randine NOVAK, RN Outcome: Adequate for Discharge   Problem: Cardiac: Goal:  Ability to achieve and maintain adequate cardiovascular perfusion will improve 11/02/2023 1255 by Marylu Randine NOVAK, RN Outcome: Progressing 11/02/2023 1255 by Marylu Randine NOVAK, RN Outcome: Adequate for Discharge  Problem: Health Behavior/Discharge Planning: Goal: Ability to safely manage health-related needs after discharge will improve 11/02/2023 1255 by Marylu Randine NOVAK, RN Outcome: Progressing 11/02/2023 1255 by Marylu Randine NOVAK, RN Outcome: Adequate for Discharge   Problem: Education: Goal: Understanding of CV disease, CV risk reduction, and recovery process will improve 11/02/2023 1255 by Marylu Randine NOVAK, RN Outcome: Progressing 11/02/2023 1255 by Marylu Randine NOVAK, RN Outcome: Adequate for Discharge Goal: Individualized Educational Video(s) 11/02/2023 1255 by Marylu Randine NOVAK, RN Outcome: Progressing 11/02/2023 1255 by Marylu Randine NOVAK, RN Outcome: Adequate for Discharge   Problem: Activity: Goal: Ability to return to baseline activity level will improve 11/02/2023 1255 by Marylu Randine NOVAK, RN Outcome: Progressing 11/02/2023 1255 by Marylu Randine NOVAK, RN Outcome: Adequate for Discharge   Problem: Cardiovascular: Goal: Ability to achieve and maintain adequate cardiovascular perfusion will improve 11/02/2023 1255 by Marylu Randine NOVAK, RN Outcome: Progressing 11/02/2023 1255 by Marylu Randine NOVAK, RN Outcome: Adequate for Discharge Goal: Vascular access site(s) Level 0-1 will be maintained 11/02/2023 1255 by Marylu Randine NOVAK, RN Outcome: Progressing 11/02/2023 1255 by Marylu Randine NOVAK, RN Outcome: Adequate for Discharge   Problem: Health Behavior/Discharge Planning: Goal: Ability to safely manage health-related needs after discharge will improve 11/02/2023 1255 by Marylu Randine NOVAK, RN Outcome: Progressing 11/02/2023 1255 by Marylu Randine NOVAK, RN Outcome: Adequate for Discharge

## 2023-11-02 NOTE — Progress Notes (Signed)
 DISCHARGE NOTE HOME [name] to be discharged Home per MD order. Discussed prescriptions and follow up appointments with the patient. Prescriptions given to patient; medication list explained in detail. Patient verbalized understanding.  Skin clean, dry and intact without evidence of skin break down, no evidence of skin tears noted. IV catheter discontinued intact. Site without signs and symptoms of complications. Dressing and pressure applied. Pt denies pain at the site currently. No complaints noted.  Patient free of lines, drains, and wounds.   An After Visit Summary (AVS) was printed and given to the patient. Patient escorted via wheelchair, and discharged home via private auto.  [name], RN

## 2023-11-02 NOTE — Plan of Care (Signed)
  Problem: Education: Goal: Knowledge of General Education information will improve Description: Including pain rating scale, medication(s)/side effects and non-pharmacologic comfort measures Outcome: Adequate for Discharge   Problem: Health Behavior/Discharge Planning: Goal: Ability to manage health-related needs will improve Outcome: Adequate for Discharge   Problem: Clinical Measurements: Goal: Ability to maintain clinical measurements within normal limits will improve Outcome: Adequate for Discharge Goal: Will remain free from infection Outcome: Adequate for Discharge Goal: Diagnostic test results will improve Outcome: Adequate for Discharge Goal: Respiratory complications will improve Outcome: Adequate for Discharge Goal: Cardiovascular complication will be avoided Outcome: Adequate for Discharge   Problem: Activity: Goal: Risk for activity intolerance will decrease Outcome: Adequate for Discharge   Problem: Nutrition: Goal: Adequate nutrition will be maintained Outcome: Adequate for Discharge   Problem: Coping: Goal: Level of anxiety will decrease Outcome: Adequate for Discharge   Problem: Elimination: Goal: Will not experience complications related to bowel motility Outcome: Adequate for Discharge Goal: Will not experience complications related to urinary retention Outcome: Adequate for Discharge   Problem: Pain Managment: Goal: General experience of comfort will improve and/or be controlled Outcome: Adequate for Discharge   Problem: Safety: Goal: Ability to remain free from injury will improve Outcome: Adequate for Discharge   Problem: Skin Integrity: Goal: Risk for impaired skin integrity will decrease Outcome: Adequate for Discharge   Problem: Education: Goal: Understanding of cardiac disease, CV risk reduction, and recovery process will improve Outcome: Adequate for Discharge Goal: Individualized Educational Video(s) Outcome: Adequate for Discharge    Problem: Activity: Goal: Ability to tolerate increased activity will improve Outcome: Adequate for Discharge   Problem: Cardiac: Goal: Ability to achieve and maintain adequate cardiovascular perfusion will improve Outcome: Adequate for Discharge   Problem: Health Behavior/Discharge Planning: Goal: Ability to safely manage health-related needs after discharge will improve Outcome: Adequate for Discharge   Problem: Education: Goal: Understanding of CV disease, CV risk reduction, and recovery process will improve Outcome: Adequate for Discharge Goal: Individualized Educational Video(s) Outcome: Adequate for Discharge   Problem: Activity: Goal: Ability to return to baseline activity level will improve Outcome: Adequate for Discharge   Problem: Cardiovascular: Goal: Ability to achieve and maintain adequate cardiovascular perfusion will improve Outcome: Adequate for Discharge Goal: Vascular access site(s) Level 0-1 will be maintained Outcome: Adequate for Discharge   Problem: Health Behavior/Discharge Planning: Goal: Ability to safely manage health-related needs after discharge will improve Outcome: Adequate for Discharge

## 2023-11-03 ENCOUNTER — Encounter: Payer: Self-pay | Admitting: Radiation Oncology

## 2023-11-04 ENCOUNTER — Telehealth: Payer: Self-pay

## 2023-11-04 NOTE — Progress Notes (Signed)
..  Complex Care Management   11/04/2023  Name: [name]  MRN: [id number]  DOB: [birth year]  Inbound call received from [name] after receiving an Interactive Voice Response (IVR) call after a recent hospitalization. Information was provided about Care Management services.  Follow up plan: No further follow up required at this time    Leita Lyme, BLANCH CAULK [facility]  [facility] Care Management Assistant [phone]

## 2023-11-05 ENCOUNTER — Telehealth: Payer: Self-pay | Admitting: Cardiology

## 2023-11-05 MED ORDER — ASPIRIN 81 MG PO TBEC: 81.0000 mg | DELAYED_RELEASE_TABLET | Freq: Every day | ORAL | Status: DC

## 2023-11-05 NOTE — Telephone Encounter (Signed)
 Spoke with pt's daughter, [name], per [facility] regarding Plavix. Discussed with DOD Dr. Ladona who stated the pt should take Aspirin  81 mg daily for 30 days and then stop it. Daughter aware. Daughter verbalized understanding. All questions if any were answered. Med list updated.

## 2023-11-05 NOTE — Telephone Encounter (Signed)
 Pts daughter alling to state she thought provider prescribed plavix for pt after her recent hospital visit, but she did not see anything about it in the AVS. Requesting a callback for more info. Please advise.

## 2023-11-06 ENCOUNTER — Observation Stay (HOSPITAL_COMMUNITY)

## 2023-11-06 ENCOUNTER — Other Ambulatory Visit: Payer: Self-pay

## 2023-11-06 ENCOUNTER — Emergency Department (HOSPITAL_COMMUNITY)

## 2023-11-06 ENCOUNTER — Other Ambulatory Visit (HOSPITAL_COMMUNITY): Payer: Self-pay | Admitting: Radiology

## 2023-11-06 ENCOUNTER — Inpatient Hospital Stay (HOSPITAL_COMMUNITY)
Admission: EM | Admit: 2023-11-06 | Discharge: 2023-11-09 | DRG: 064 | Disposition: A | Discharge status: Rehabilitation Facility | Attending: Internal Medicine | Admitting: Internal Medicine

## 2023-11-06 DIAGNOSIS — Z515 Encounter for palliative care: Secondary | ICD-10-CM | POA: Diagnosis not present

## 2023-11-06 DIAGNOSIS — D649 Anemia, unspecified: Secondary | ICD-10-CM | POA: Diagnosis present

## 2023-11-06 DIAGNOSIS — R5381 Other malaise: Secondary | ICD-10-CM | POA: Diagnosis present

## 2023-11-06 DIAGNOSIS — Z66 Do not resuscitate: Secondary | ICD-10-CM | POA: Diagnosis not present

## 2023-11-06 DIAGNOSIS — I2510 Atherosclerotic heart disease of native coronary artery without angina pectoris: Secondary | ICD-10-CM | POA: Diagnosis present

## 2023-11-06 DIAGNOSIS — Z825 Family history of asthma and other chronic lower respiratory diseases: Secondary | ICD-10-CM

## 2023-11-06 DIAGNOSIS — R29898 Other symptoms and signs involving the musculoskeletal system: Secondary | ICD-10-CM | POA: Diagnosis not present

## 2023-11-06 DIAGNOSIS — R4701 Aphasia: Secondary | ICD-10-CM | POA: Diagnosis not present

## 2023-11-06 DIAGNOSIS — K219 Gastro-esophageal reflux disease without esophagitis: Secondary | ICD-10-CM | POA: Diagnosis present

## 2023-11-06 DIAGNOSIS — E119 Type 2 diabetes mellitus without complications: Secondary | ICD-10-CM | POA: Diagnosis not present

## 2023-11-06 DIAGNOSIS — C50911 Malignant neoplasm of unspecified site of right female breast: Secondary | ICD-10-CM | POA: Diagnosis not present

## 2023-11-06 DIAGNOSIS — C50512 Malignant neoplasm of lower-outer quadrant of left female breast: Secondary | ICD-10-CM | POA: Diagnosis not present

## 2023-11-06 DIAGNOSIS — R29810 Facial weakness: Secondary | ICD-10-CM | POA: Diagnosis present

## 2023-11-06 DIAGNOSIS — E782 Mixed hyperlipidemia: Secondary | ICD-10-CM | POA: Diagnosis present

## 2023-11-06 DIAGNOSIS — I4892 Unspecified atrial flutter: Secondary | ICD-10-CM | POA: Diagnosis not present

## 2023-11-06 DIAGNOSIS — Z923 Personal history of irradiation: Secondary | ICD-10-CM | POA: Diagnosis not present

## 2023-11-06 DIAGNOSIS — R29818 Other symptoms and signs involving the nervous system: Secondary | ICD-10-CM | POA: Diagnosis not present

## 2023-11-06 DIAGNOSIS — Z79890 Hormone replacement therapy (postmenopausal): Secondary | ICD-10-CM | POA: Diagnosis not present

## 2023-11-06 DIAGNOSIS — C3432 Malignant neoplasm of lower lobe, left bronchus or lung: Secondary | ICD-10-CM | POA: Diagnosis not present

## 2023-11-06 DIAGNOSIS — Z9861 Coronary angioplasty status: Secondary | ICD-10-CM

## 2023-11-06 DIAGNOSIS — M542 Cervicalgia: Secondary | ICD-10-CM | POA: Diagnosis not present

## 2023-11-06 DIAGNOSIS — E871 Hypo-osmolality and hyponatremia: Secondary | ICD-10-CM | POA: Diagnosis not present

## 2023-11-06 DIAGNOSIS — R040 Epistaxis: Secondary | ICD-10-CM | POA: Diagnosis not present

## 2023-11-06 DIAGNOSIS — I480 Paroxysmal atrial fibrillation: Secondary | ICD-10-CM | POA: Diagnosis present

## 2023-11-06 DIAGNOSIS — I214 Non-ST elevation (NSTEMI) myocardial infarction: Secondary | ICD-10-CM | POA: Diagnosis present

## 2023-11-06 DIAGNOSIS — I639 Cerebral infarction, unspecified: Principal | ICD-10-CM | POA: Diagnosis present

## 2023-11-06 DIAGNOSIS — E222 Syndrome of inappropriate secretion of antidiuretic hormone: Secondary | ICD-10-CM | POA: Diagnosis not present

## 2023-11-06 DIAGNOSIS — I6310 Cerebral infarction due to embolism of unspecified precerebral artery: Secondary | ICD-10-CM

## 2023-11-06 DIAGNOSIS — R29705 NIHSS score 5: Secondary | ICD-10-CM | POA: Diagnosis present

## 2023-11-06 DIAGNOSIS — I5020 Unspecified systolic (congestive) heart failure: Secondary | ICD-10-CM | POA: Diagnosis not present

## 2023-11-06 DIAGNOSIS — M25512 Pain in left shoulder: Secondary | ICD-10-CM | POA: Diagnosis not present

## 2023-11-06 DIAGNOSIS — J849 Interstitial pulmonary disease, unspecified: Secondary | ICD-10-CM | POA: Diagnosis not present

## 2023-11-06 DIAGNOSIS — Z853 Personal history of malignant neoplasm of breast: Secondary | ICD-10-CM

## 2023-11-06 DIAGNOSIS — I6349 Cerebral infarction due to embolism of other cerebral artery: Principal | ICD-10-CM | POA: Diagnosis present

## 2023-11-06 DIAGNOSIS — C3490 Malignant neoplasm of unspecified part of unspecified bronchus or lung: Secondary | ICD-10-CM | POA: Diagnosis not present

## 2023-11-06 DIAGNOSIS — R7989 Other specified abnormal findings of blood chemistry: Secondary | ICD-10-CM | POA: Diagnosis not present

## 2023-11-06 DIAGNOSIS — I6340 Cerebral infarction due to embolism of unspecified cerebral artery: Secondary | ICD-10-CM

## 2023-11-06 DIAGNOSIS — Z888 Allergy status to other drugs, medicaments and biological substances status: Secondary | ICD-10-CM

## 2023-11-06 DIAGNOSIS — T45516A Underdosing of anticoagulants, initial encounter: Secondary | ICD-10-CM | POA: Diagnosis not present

## 2023-11-06 DIAGNOSIS — E43 Unspecified severe protein-calorie malnutrition: Secondary | ICD-10-CM | POA: Diagnosis not present

## 2023-11-06 DIAGNOSIS — E039 Hypothyroidism, unspecified: Secondary | ICD-10-CM | POA: Diagnosis present

## 2023-11-06 DIAGNOSIS — Z881 Allergy status to other antibiotic agents status: Secondary | ICD-10-CM

## 2023-11-06 DIAGNOSIS — I6523 Occlusion and stenosis of bilateral carotid arteries: Secondary | ICD-10-CM | POA: Diagnosis not present

## 2023-11-06 DIAGNOSIS — Z91148 Patient's other noncompliance with medication regimen for other reason: Secondary | ICD-10-CM

## 2023-11-06 DIAGNOSIS — Z7901 Long term (current) use of anticoagulants: Secondary | ICD-10-CM

## 2023-11-06 DIAGNOSIS — M329 Systemic lupus erythematosus, unspecified: Secondary | ICD-10-CM | POA: Diagnosis not present

## 2023-11-06 DIAGNOSIS — G8191 Hemiplegia, unspecified affecting right dominant side: Secondary | ICD-10-CM | POA: Diagnosis present

## 2023-11-06 DIAGNOSIS — E785 Hyperlipidemia, unspecified: Secondary | ICD-10-CM | POA: Diagnosis not present

## 2023-11-06 DIAGNOSIS — C3492 Malignant neoplasm of unspecified part of left bronchus or lung: Secondary | ICD-10-CM | POA: Diagnosis not present

## 2023-11-06 DIAGNOSIS — Z9012 Acquired absence of left breast and nipple: Secondary | ICD-10-CM | POA: Diagnosis not present

## 2023-11-06 DIAGNOSIS — E440 Moderate protein-calorie malnutrition: Secondary | ICD-10-CM | POA: Diagnosis not present

## 2023-11-06 DIAGNOSIS — I4891 Unspecified atrial fibrillation: Secondary | ICD-10-CM

## 2023-11-06 DIAGNOSIS — M7918 Myalgia, other site: Secondary | ICD-10-CM | POA: Diagnosis not present

## 2023-11-06 DIAGNOSIS — Z79899 Other long term (current) drug therapy: Secondary | ICD-10-CM | POA: Diagnosis not present

## 2023-11-06 DIAGNOSIS — M25511 Pain in right shoulder: Secondary | ICD-10-CM | POA: Diagnosis not present

## 2023-11-06 DIAGNOSIS — Z170 Estrogen receptor positive status [ER+]: Secondary | ICD-10-CM | POA: Diagnosis not present

## 2023-11-06 DIAGNOSIS — R29709 NIHSS score 9: Secondary | ICD-10-CM | POA: Diagnosis not present

## 2023-11-06 DIAGNOSIS — Z85118 Personal history of other malignant neoplasm of bronchus and lung: Secondary | ICD-10-CM

## 2023-11-06 DIAGNOSIS — R531 Weakness: Secondary | ICD-10-CM | POA: Diagnosis not present

## 2023-11-06 DIAGNOSIS — B370 Candidal stomatitis: Secondary | ICD-10-CM | POA: Diagnosis not present

## 2023-11-06 DIAGNOSIS — Z7982 Long term (current) use of aspirin: Secondary | ICD-10-CM | POA: Diagnosis not present

## 2023-11-06 DIAGNOSIS — D6869 Other thrombophilia: Secondary | ICD-10-CM

## 2023-11-06 DIAGNOSIS — K5900 Constipation, unspecified: Secondary | ICD-10-CM | POA: Diagnosis not present

## 2023-11-06 DIAGNOSIS — Z7984 Long term (current) use of oral hypoglycemic drugs: Secondary | ICD-10-CM | POA: Diagnosis not present

## 2023-11-06 DIAGNOSIS — I6389 Other cerebral infarction: Secondary | ICD-10-CM | POA: Diagnosis not present

## 2023-11-06 DIAGNOSIS — Z79811 Long term (current) use of aromatase inhibitors: Secondary | ICD-10-CM

## 2023-11-06 DIAGNOSIS — R4781 Slurred speech: Secondary | ICD-10-CM | POA: Diagnosis not present

## 2023-11-06 DIAGNOSIS — Z803 Family history of malignant neoplasm of breast: Secondary | ICD-10-CM

## 2023-11-06 DIAGNOSIS — I10 Essential (primary) hypertension: Secondary | ICD-10-CM | POA: Diagnosis present

## 2023-11-06 DIAGNOSIS — Z9889 Other specified postprocedural states: Secondary | ICD-10-CM | POA: Diagnosis not present

## 2023-11-06 DIAGNOSIS — R1310 Dysphagia, unspecified: Secondary | ICD-10-CM | POA: Diagnosis not present

## 2023-11-06 DIAGNOSIS — I5032 Chronic diastolic (congestive) heart failure: Secondary | ICD-10-CM | POA: Diagnosis not present

## 2023-11-06 DIAGNOSIS — C771 Secondary and unspecified malignant neoplasm of intrathoracic lymph nodes: Secondary | ICD-10-CM | POA: Diagnosis not present

## 2023-11-06 DIAGNOSIS — Z5941 Food insecurity: Secondary | ICD-10-CM

## 2023-11-06 DIAGNOSIS — N179 Acute kidney failure, unspecified: Secondary | ICD-10-CM | POA: Diagnosis not present

## 2023-11-06 DIAGNOSIS — R471 Dysarthria and anarthria: Secondary | ICD-10-CM | POA: Diagnosis present

## 2023-11-06 DIAGNOSIS — I69351 Hemiplegia and hemiparesis following cerebral infarction affecting right dominant side: Secondary | ICD-10-CM | POA: Diagnosis not present

## 2023-11-06 DIAGNOSIS — D500 Iron deficiency anemia secondary to blood loss (chronic): Secondary | ICD-10-CM | POA: Diagnosis not present

## 2023-11-06 DIAGNOSIS — Z794 Long term (current) use of insulin: Secondary | ICD-10-CM | POA: Diagnosis not present

## 2023-11-06 DIAGNOSIS — Z7189 Other specified counseling: Secondary | ICD-10-CM | POA: Diagnosis not present

## 2023-11-06 DIAGNOSIS — D696 Thrombocytopenia, unspecified: Secondary | ICD-10-CM | POA: Diagnosis not present

## 2023-11-06 DIAGNOSIS — I110 Hypertensive heart disease with heart failure: Secondary | ICD-10-CM | POA: Diagnosis not present

## 2023-11-06 DIAGNOSIS — R64 Cachexia: Secondary | ICD-10-CM | POA: Diagnosis not present

## 2023-11-06 DIAGNOSIS — E1169 Type 2 diabetes mellitus with other specified complication: Secondary | ICD-10-CM | POA: Diagnosis not present

## 2023-11-06 DIAGNOSIS — R630 Anorexia: Secondary | ICD-10-CM | POA: Diagnosis not present

## 2023-11-06 DIAGNOSIS — D508 Other iron deficiency anemias: Secondary | ICD-10-CM | POA: Diagnosis not present

## 2023-11-06 DIAGNOSIS — K449 Diaphragmatic hernia without obstruction or gangrene: Secondary | ICD-10-CM | POA: Diagnosis not present

## 2023-11-06 DIAGNOSIS — M3211 Endocarditis in systemic lupus erythematosus: Secondary | ICD-10-CM | POA: Diagnosis not present

## 2023-11-06 DIAGNOSIS — K5901 Slow transit constipation: Secondary | ICD-10-CM | POA: Diagnosis not present

## 2023-11-06 LAB — CBC
HCT: 33.4 % — ABNORMAL LOW (ref 36.0–46.0)
Hemoglobin: 10.7 g/dL — ABNORMAL LOW (ref 12.0–15.0)
MCH: 28.3 pg (ref 26.0–34.0)
MCHC: 32 g/dL (ref 30.0–36.0)
MCV: 88.4 fL (ref 80.0–100.0)
Platelets: 123 K/uL — ABNORMAL LOW (ref 150–400)
RBC: 3.78 MIL/uL — ABNORMAL LOW (ref 3.87–5.11)
RDW: 17.2 % — ABNORMAL HIGH (ref 11.5–15.5)
WBC: 4.8 K/uL (ref 4.0–10.5)
nRBC: 0 % (ref 0.0–0.2)

## 2023-11-06 LAB — URINALYSIS, ROUTINE W REFLEX MICROSCOPIC
Bilirubin Urine: NEGATIVE
Glucose, UA: NEGATIVE mg/dL
Hgb urine dipstick: NEGATIVE
Ketones, ur: NEGATIVE mg/dL
Leukocytes,Ua: NEGATIVE
Nitrite: NEGATIVE
Protein, ur: NEGATIVE mg/dL
Specific Gravity, Urine: 1.01 (ref 1.005–1.030)
pH: 6 (ref 5.0–8.0)

## 2023-11-06 LAB — HEMOGLOBIN A1C
Hgb A1c MFr Bld: 6 % — ABNORMAL HIGH (ref 4.8–5.6)
Mean Plasma Glucose: 125.5 mg/dL

## 2023-11-06 LAB — COMPREHENSIVE METABOLIC PANEL WITH GFR
ALT: 20 U/L (ref 0–44)
AST: 32 U/L (ref 15–41)
Albumin: 3.7 g/dL (ref 3.5–5.0)
Alkaline Phosphatase: 64 U/L (ref 38–126)
Anion gap: 10 (ref 5–15)
BUN: 10 mg/dL (ref 8–23)
CO2: 24 mmol/L (ref 22–32)
Calcium: 9.4 mg/dL (ref 8.9–10.3)
Chloride: 99 mmol/L (ref 98–111)
Creatinine, Ser: 1.01 mg/dL — ABNORMAL HIGH (ref 0.44–1.00)
GFR, Estimated: 56 mL/min — ABNORMAL LOW (ref >60)
Glucose, Bld: 95 mg/dL (ref 70–99)
Potassium: 3.8 mmol/L (ref 3.5–5.1)
Sodium: 133 mmol/L — ABNORMAL LOW (ref 135–145)
Total Bilirubin: 0.5 mg/dL (ref 0.0–1.2)
Total Protein: 8 g/dL (ref 6.5–8.1)

## 2023-11-06 LAB — DIFFERENTIAL
Abs Immature Granulocytes: 0.04 K/uL (ref 0.00–0.07)
Basophils Absolute: 0 K/uL (ref 0.0–0.1)
Basophils Relative: 0 %
Eosinophils Absolute: 0.1 K/uL (ref 0.0–0.5)
Eosinophils Relative: 2 %
Immature Granulocytes: 1 %
Lymphocytes Relative: 7 %
Lymphs Abs: 0.3 K/uL — ABNORMAL LOW (ref 0.7–4.0)
Monocytes Absolute: 0.5 K/uL (ref 0.1–1.0)
Monocytes Relative: 9 %
Neutro Abs: 3.9 K/uL (ref 1.7–7.7)
Neutrophils Relative %: 81 %

## 2023-11-06 LAB — APTT: aPTT: 26 s (ref 24–36)

## 2023-11-06 LAB — CBG MONITORING, ED: Glucose-Capillary: 76 mg/dL (ref 70–99)

## 2023-11-06 LAB — PROTIME-INR
INR: 1.4 — ABNORMAL HIGH (ref 0.8–1.2)
Prothrombin Time: 18.4 s — ABNORMAL HIGH (ref 11.4–15.2)

## 2023-11-06 LAB — ETHANOL: Alcohol, Ethyl (B): <15 mg/dL (ref <15)

## 2023-11-06 MED ORDER — APIXABAN 5 MG PO TABS
5.0000 mg | ORAL_TABLET | Freq: Two times a day (BID) | ORAL | Status: DC
  Administered 2023-11-06: 5 mg via ORAL
  Filled 2023-11-06: qty 1

## 2023-11-06 MED ORDER — STROKE: EARLY STAGES OF RECOVERY BOOK
Freq: Once | Status: AC
  Filled 2023-11-06: qty 1

## 2023-11-06 MED ORDER — ATORVASTATIN CALCIUM 40 MG PO TABS
40.0000 mg | ORAL_TABLET | Freq: Every day | ORAL | Status: DC
  Administered 2023-11-07 – 2023-11-09 (×3): 40 mg via ORAL
  Filled 2023-11-06 (×3): qty 1

## 2023-11-06 MED ORDER — ALBUTEROL SULFATE (2.5 MG/3ML) 0.083% IN NEBU: 2.5000 mg | INHALATION_SOLUTION | Freq: Four times a day (QID) | RESPIRATORY_TRACT | Status: DC | PRN

## 2023-11-06 MED ORDER — GABAPENTIN 300 MG PO CAPS
300.0000 mg | ORAL_CAPSULE | Freq: Three times a day (TID) | ORAL | Status: DC
  Administered 2023-11-06 – 2023-11-09 (×8): 300 mg via ORAL
  Filled 2023-11-06 (×9): qty 1

## 2023-11-06 MED ORDER — ANASTROZOLE 1 MG PO TABS
1.0000 mg | ORAL_TABLET | Freq: Every day | ORAL | Status: DC
Start: 2023-11-07 — End: 2023-11-09
  Administered 2023-11-07 – 2023-11-09 (×3): 1 mg via ORAL
  Filled 2023-11-06 (×3): qty 1

## 2023-11-06 MED ORDER — ASPIRIN 81 MG PO TBEC
81.0000 mg | DELAYED_RELEASE_TABLET | Freq: Every day | ORAL | Status: DC
  Administered 2023-11-07 – 2023-11-09 (×3): 81 mg via ORAL
  Filled 2023-11-06 (×3): qty 1

## 2023-11-06 MED ORDER — ACETAMINOPHEN 650 MG RE SUPP: 650.0000 mg | RECTAL | Status: DC | PRN

## 2023-11-06 MED ORDER — ACETAMINOPHEN 325 MG PO TABS
650.0000 mg | ORAL_TABLET | ORAL | Status: DC | PRN
  Administered 2023-11-09: 650 mg via ORAL
  Filled 2023-11-06: qty 2

## 2023-11-06 MED ORDER — HYDROXYCHLOROQUINE SULFATE 200 MG PO TABS
200.0000 mg | ORAL_TABLET | Freq: Every day | ORAL | Status: DC
  Administered 2023-11-07 – 2023-11-09 (×3): 200 mg via ORAL
  Filled 2023-11-06 (×3): qty 1

## 2023-11-06 MED ORDER — SODIUM CHLORIDE 0.9 % IV SOLN: INTRAVENOUS | Status: AC

## 2023-11-06 MED ORDER — SENNOSIDES-DOCUSATE SODIUM 8.6-50 MG PO TABS: 1.0000 | ORAL_TABLET | Freq: Every evening | ORAL | Status: DC | PRN

## 2023-11-06 MED ORDER — ACETAMINOPHEN 160 MG/5ML PO SOLN: 650.0000 mg | ORAL | Status: DC | PRN

## 2023-11-06 MED ORDER — HYDRALAZINE HCL 20 MG/ML IJ SOLN: 10.0000 mg | INTRAMUSCULAR | Status: DC | PRN

## 2023-11-06 MED ORDER — LEVOTHYROXINE SODIUM 100 MCG PO TABS
100.0000 ug | ORAL_TABLET | Freq: Every day | ORAL | Status: DC
  Administered 2023-11-07 – 2023-11-09 (×3): 100 ug via ORAL
  Filled 2023-11-06 (×3): qty 1

## 2023-11-06 NOTE — Consult Note (Signed)
 NEUROLOGY CONSULT NOTE   Date of service: November 06, 2023 Patient Name: [name] MRN:  [id number] DOB:  1941-01-22 Chief Complaint: strokes Requesting Provider: Claudene Maximino LABOR, MD  History of Present Illness  [name] is a 83 y.o. 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  She was admitted from 10 4-10 7 and underwent heart cath and was discharged home on the seventh.  On 11/05/2023, around noon, patient was talking normally and then around 1 PM, developed some slurring of her speech.  The slurred speech resolved after a few hours.  She was able to walk to her bed last night with her walker.  She got into the bed but this morning, she was unable to get out of the bed.  She eventually called for her daughter around 15 AM to see if she can help her get up from the bed.  The daughter as well as of the family member attempted but had significant difficulty getting her out of the bed. - They called EMS and patient was brought into the ED where she had a CT head without contrast which was negative for acute intracranial normalities.  Patient was not a candidate for TNKase as she is on Eliquis  and she did not have any LVO signs.  She was therefore not activated as a code stroke.  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  As well as infarcts in bilateral cerebellum.  I spoke with patient's daughter at the bedside.  Patient gets her medications in a blister pack and upon reviewing the leftover medications, it seems the patient missed her evening dose of Eliquis  on 10/8 as well as 10/9 and missed her Eliquis  this a.m.  Patient also was recently diagnosed with lung cancer.  Fine-needle aspiration in June  of this year showed that this is an adenocarcinoma.  Patient follows with oncology as well as underwent radiation for the cancer.  LKW: 1200 on 11/05/2023 Modified rankin score: 3-Moderate disability-requires help but walks WITHOUT assistance IV Thrombolysis: Not offered, patient is on Eliquis .   EVT: Not offered, no LVO.    NIHSS components Score: Comment  1a Level of Conscious 0[x]  1[]  2[]  3[]      1b LOC Questions 0[x]  1[]  2[]       1c LOC Commands 0[x]  1[]  2[]       2 Best Gaze 0[x]  1[]  2[]       3 Visual 0[]  1[x]  2[]  3[]    Inconsistent responses to visual field testing in right Hemi visual field.  4 Facial Palsy 0[x]  1[]  2[]  3[]      5a Motor Arm - left 0[x]  1[]  2[]  3[]  4[]  UN[]    5b Motor Arm - Right 0[]  1[]  2[x]  3[]  4[]  UN[]    6a Motor Leg - Left 0[x]  1[]  2[]  3[]  4[]  UN[]    6b Motor Leg - Right 0[]  1[x]  2[]  3[]  4[]  UN[]    7 Limb Ataxia 0[x]  1[]  2[]  UN[]      8 Sensory 0[]  1[x]  2[]  UN[]    Slightly decreased to touch in right lower extremity.  9 Best Language 0[x]  1[]  2[]  3[]      10 Dysarthria 0[x]  1[]  2[]  UN[]      11 Extinct. and Inattention 0[x]  1[]  2[]       TOTAL: 5  ROS  Comprehensive ROS performed and pertinent positives documented in HPI   Past History   Past Medical History:  Diagnosis Date   Acute bronchitis 12/17/2021   Acute cystitis 02/20/2019   Arthritis    Atrial flutter (HCC)    Breast cancer (HCC)    left  2009 and 2025   Diabetes mellitus without complication (HCC)    Dyspnea    mostly with exertion   GERD (gastroesophageal reflux disease)    History of radiation therapy    Left Lung-08/09/23-10/01/23- Dr. 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[facility])    Lupus    Pneumonia 07/31/2021    Past Surgical History:  Procedure Laterality Date   BREAST BIOPSY Left 01/15/2023   US  LT BREAST BX W LOC DEV 1ST LESION IMG BX SPEC US  GUIDE 01/15/2023 [facility]AMMOGRAP[facility]   BREAST LUMPECTOMY Left 2009   BRONCHIAL BIOPSY   06/29/2023   Procedure: BRONCHOSCOPY, WITH BIOPSY;  Surgeon: [name], MD;  Location: [facility] ENDOSCOPY;  Service: Pulmonary;;   BRONCHIAL BRUSHINGS  06/29/2023   Procedure: BRONCHOSCOPY, WITH BRUSH BIOPSY;  Surgeon: [name], MD;  Location: [facility] ENDOSCOPY;  Service: Pulmonary;;   BRONCHIAL NEEDLE ASPIRATION BIOPSY  06/29/2023   Procedure: BRONCHOSCOPY, WITH NEEDLE ASPIRATION BIOPSY;  Surgeon: [name], MD;  Location: [facility] ENDOSCOPY;  Service: Pulmonary;;   IR 3D INDEPENDENT WKST  05/29/2021   IR ANGIO INTRA EXTRACRAN SEL COM CAROTID INNOMINATE BILAT MOD SED  05/29/2021   IR ANGIO VERTEBRAL SEL VERTEBRAL BILAT MOD SED  05/29/2021   LEFT HEART CATH AND CORONARY ANGIOGRAPHY N/A 11/01/2023   Procedure: LEFT HEART CATH AND CORONARY ANGIOGRAPHY;  Surgeon: Wonda Sharper, MD;  Location: [facility] INVASIVE CV LAB;  Service: Cardiovascular;  Laterality: N/A;   SIMPLE MASTECTOMY WITH AXILLARY SENTINEL NODE BIOPSY Left 03/31/2023   Procedure: LEFT MASTECTOMY;  Surgeon: Vernetta Berg, MD;  Location: [facility] OR;  Service: General;  Laterality: Left;  LMA PEC BLOCK   VIDEO BRONCHOSCOPY WITH ENDOBRONCHIAL NAVIGATION Left 06/29/2023   Procedure: VIDEO BRONCHOSCOPY WITH ENDOBRONCHIAL NAVIGATION;  Surgeon: [name], MD;  Location: [facility] ENDOSCOPY;  Service: Pulmonary;  Laterality: Left;   VIDEO BRONCHOSCOPY WITH ENDOBRONCHIAL ULTRASOUND Bilateral 06/29/2023   Procedure: BRONCHOSCOPY, WITH EBUS;  Surgeon: [name], MD;  Location: [facility] ENDOSCOPY;  Service: Pulmonary;  Laterality: Bilateral;    Family History: Family History  Problem Relation Age of Onset   Cancer Father        prostate   Prostate cancer Father 49 - 48       metaststic   Asthma Brother    Cancer Daughter        breast   Breast cancer Daughter 37    Social History  reports that she has never smoked. She has never been exposed to tobacco smoke. She does not have any smokeless tobacco history on file. She reports that she does not drink  alcohol  and does not use drugs.  Allergies  Allergen Reactions   Ciprofloxacin Itching, Swelling and Other (See Comments)    Possibly causing tremors?   Crestor  [Rosuvastatin ] Other (See Comments)    Myalgia and back pain   Lisinopril  Cough   Ofev  [Nintedanib] Nausea Only    Medications   Current Facility-Administered Medications:    [START ON 11/07/2023]  stroke: early stages of recovery book, , Does not apply, Once, Smith, Rondell A, MD   0.9 %  sodium chloride  infusion, ,  Intravenous, Continuous, Claudene Maximino LABOR, MD, Last Rate: 40 mL/hr at 11/06/23 1625, New Bag at 11/06/23 1625   acetaminophen  (TYLENOL ) tablet 650 mg, 650 mg, Oral, Q4H PRN **OR** acetaminophen  (TYLENOL ) 160 MG/5ML solution 650 mg, 650 mg, Per Tube, Q4H PRN **OR** acetaminophen  (TYLENOL ) suppository 650 mg, 650 mg, Rectal, Q4H PRN, Smith, Rondell A, MD   albuterol  (PROVENTIL ) (2.5 MG/3ML) 0.083% nebulizer solution 2.5 mg, 2.5 mg, Nebulization, Q6H PRN, Claudene, Maximino LABOR, MD   [START ON 11/07/2023] anastrozole  (ARIMIDEX ) tablet 1 mg, 1 mg, Oral, Daily, Smith, Rondell A, MD   aspirin  EC tablet 81 mg, 81 mg, Oral, Daily, Smith, Rondell A, MD   [START ON 11/07/2023] atorvastatin  (LIPITOR) tablet 40 mg, 40 mg, Oral, Daily, Smith, Rondell A, MD   gabapentin  (NEURONTIN ) capsule 300 mg, 300 mg, Oral, TID, Claudene, Rondell A, MD, 300 mg at 11/06/23 2230   hydrALAZINE  (APRESOLINE ) injection 10 mg, 10 mg, Intravenous, Q4H PRN, Claudene Maximino LABOR, MD   [START ON 11/07/2023] hydroxychloroquine  (PLAQUENIL ) tablet 200 mg, 200 mg, Oral, Daily, Claudene, Rondell A, MD   [START ON 11/07/2023] levothyroxine  (SYNTHROID ) tablet 100 mcg, 100 mcg, Oral, Daily, Smith, Rondell A, MD   senna-docusate (Senokot-S) tablet 1 tablet, 1 tablet, Oral, QHS PRN, Claudene Maximino LABOR, MD  Current Outpatient Medications:    albuterol  (ACCUNEB ) 0.63 MG/3ML nebulizer solution, Take 3 mLs (0.63 mg total) by nebulization every 6 (six) hours as needed for wheezing.,  Disp: 75 mL, Rfl: 12   albuterol  (VENTOLIN  HFA) 108 (90 Base) MCG/ACT inhaler, Inhale 2 puffs into the lungs every 6 (six) hours as needed for wheezing or shortness of breath. (Patient not taking: Reported on 10/30/2023), Disp: 8.5 g, Rfl: 2   anastrozole  (ARIMIDEX ) 1 MG tablet, Take 1 tablet (1 mg total) by mouth daily., Disp: 90 tablet, Rfl: 3   apixaban  (ELIQUIS ) 5 MG TABS tablet, Take 1 tablet (5 mg total) by mouth 2 (two) times daily. Okay to restart on 06/30/2023, Disp: , Rfl:    aspirin  EC 81 MG tablet, Take 1 tablet (81 mg total) by mouth daily. Swallow whole., Disp: , Rfl:    atorvastatin  (LIPITOR) 40 MG tablet, Take 1 tablet (40 mg total) by mouth daily., Disp: 30 tablet, Rfl: 0   diclofenac  Sodium (VOLTAREN ) 1 % GEL, Apply 1 application. topically 4 (four) times daily as needed (pain)., Disp: , Rfl:    diltiazem  (CARDIZEM  CD) 120 MG 24 hr capsule, Take 1 capsule (120 mg total) by mouth daily., Disp: 90 capsule, Rfl: 3   gabapentin  (NEURONTIN ) 300 MG capsule, Take 300 mg by mouth 3 (three) times daily., Disp: , Rfl:    glimepiride  (AMARYL ) 1 MG tablet, Take 1 mg by mouth daily with breakfast., Disp: , Rfl:    hydroxychloroquine  (PLAQUENIL ) 200 MG tablet, Take 200 mg by mouth daily., Disp: , Rfl:    Ipratropium-Albuterol  (COMBIVENT RESPIMAT) 20-100 MCG/ACT AERS respimat, Inhale 1 puff into the lungs every 6 (six) hours as needed for shortness of breath., Disp: , Rfl:    levothyroxine  (SYNTHROID ) 100 MCG tablet, Take 100 mcg by mouth daily., Disp: , Rfl:    losartan -hydrochlorothiazide  (HYZAAR) 50-12.5 MG tablet, Take 1 tablet by mouth daily. (Patient not taking: Reported on 10/31/2023), Disp: , Rfl:    omeprazole (PRILOSEC) 20 MG capsule, Take 20 mg by mouth daily as needed (Indigestion)., Disp: , Rfl:    spironolactone  (ALDACTONE ) 25 MG tablet, Take 0.5 tablets (12.5 mg total) by mouth daily., Disp: 45 tablet, Rfl: 3  Vitals  Vitals:   11/06/23 2050 11/06/23 2100 11/06/23 2130 11/06/23  2230  BP:   (!) 152/77 (!) 153/79  Pulse:  82 80 83  Resp:   18 15  Temp: 97.8 F (36.6 C)     TempSrc: Oral     SpO2:  99% 99% 99%  Weight:      Height:        Body mass index is 18.79 kg/m.   Physical Exam   General: Laying comfortably in bed; in no acute distress.  HENT: Normal oropharynx and mucosa. Normal external appearance of ears and nose.  Neck: Supple, no pain or tenderness  CV: No JVD. No peripheral edema.  Pulmonary: Symmetric Chest rise. Normal respiratory effort.  Abdomen: Soft to touch, non-tender.  Ext: No cyanosis, edema, or deformity  Skin: No rash. Normal palpation of skin.   Musculoskeletal: Normal digits and nails by inspection. No clubbing.   Neurologic Examination  Mental status/Cognition: Alert, oriented to self, place, month and year, good attention.  Speech/language: Fluent, comprehension intact, object naming intact, repetition intact.  Cranial nerves:   CN II Pupils equal and reactive to light, inconsistent responses to visual field testing in right Hemi visual field.   CN III,IV,VI EOM intact, no gaze preference or deviation, no nystagmus    CN V normal sensation in V1, V2, and V3 segments bilaterally    CN VII no asymmetry, no nasolabial fold flattening    CN VIII normal hearing to speech    CN IX & X normal palatal elevation, no uvular deviation    CN XI 5/5 head turn and 5/5 shoulder shrug bilaterally    CN XII midline tongue protrusion    Motor:  Muscle bulk: Poor, tone normal Mvmt Root Nerve  Muscle Right Left Comments  SA C5/6 Ax Deltoid 3 5   EF C5/6 Mc Biceps 3 5   EE C6/7/8 Rad Triceps 3 5   WF C6/7 Med FCR     WE C7/8 PIN ECU     F Ab C8/T1 U ADM/FDI 4 5   HF L1/2/3 Fem Illopsoas 4 5   KE L2/3/4 Fem Quad     DF L4/5 D Peron Tib Ant 1 5   PF S1/2 Tibial Grc/Sol 3 5    Sensation:  Light touch Slightly decreased to light touch in right lower extremity.   Pin prick    Temperature    Vibration   Proprioception     Coordination/Complex Motor:  - Finger to Nose intact in left upper extremity, unable to do with right upper extremity. - Heel to shin unable to assess. - Rapid alternating movement are slowed on the right. - Gait: Deferred gait for patient safety. Labs/Imaging/Neurodiagnostic studies   CBC:  Recent Labs  Lab December 01, 2023 0517 11/06/23 1258  WBC 6.2 4.8  NEUTROABS  --  3.9  HGB 9.9* 10.7*  HCT 30.4* 33.4*  MCV 86.9 88.4  PLT 128* 123*   Basic Metabolic Panel:  Lab Results  Component Value Date   NA 133 (L) 11/06/2023   K 3.8 11/06/2023   CO2 24 11/06/2023   GLUCOSE 95 11/06/2023   BUN 10 11/06/2023   CREATININE 1.01 (H) 11/06/2023   CALCIUM  9.4 11/06/2023   GFRNONAA 56 (L) 11/06/2023   GFRAA 55 (L) 02/20/2019   Lipid Panel:  Lab Results  Component Value Date   LDLCALC 90 2023/12/01   HgbA1c:  Lab Results  Component Value Date   HGBA1C 6.0 (H) 11/06/2023  Urine Drug Screen: No results found for: LABOPIA, COCAINSCRNUR, LABBENZ, AMPHETMU, THCU, LABBARB  Alcohol  Level     Component Value Date/Time   ETH <15 11/06/2023 1259   INR  Lab Results  Component Value Date   INR 1.4 (H) 11/06/2023   APTT  Lab Results  Component Value Date   APTT 26 11/06/2023   AED levels: No results found for: PHENYTOIN, ZONISAMIDE, LAMOTRIGINE, LEVETIRACETA  CT Head without contrast(Personally reviewed): CTH was negative for a large hypodensity concerning for a large territory infarct or hyperdensity concerning for an ICH   MR Angio head without contrast and MR angio of the neck (Personally reviewed): No LVO, no significant stenosis.  MRI Brain(Personally reviewed): 1. Multiple acute infarcts in a bilateral watershed distribution involving the convexities, inferior right frontal lobe, right caudate head, posterior parietal and occipital lobes, right lateral pons, and posterior cerebellum. Findings suggest a central embolic source 2. Confluent  periventricular T2/FLAIR hyperintensities bilaterally, suggestive of chronic microvascular ischemic change.  ASSESSMENT   [name] is a 83 y.o. 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  She was admitted from 10 4-10 7 and underwent heart cath and was discharged home on the seventh.  She presents with slurred speech and right-sided weakness along with inconsistent responses to visual field testing in the right Hemi visual field.  She appears to have missed at least 2 evening doses of her Eliquis  after reviewing the medication left over in the blister pack with her daughter.  She also missed Eliquis  this a.m.  It is possible that her strokes are still cardioembolic in nature secondary to her A-fib and missing her Eliquis , however adenocarcinomas also impart a high risk for embolic stroke secondary to hypercoagulable state and in my experience, sometimes, Eliquis  may not be very effective in these cases.  For now I would recommend using neuro scale heparin  gtt.  RECOMMENDATIONS  - Frequent Neuro checks per stroke unit protocol - No need for TTE.  Recently had a cath - Recommend obtaining Lipid panel with LDL - Please start statin if LDL > 70 - Recommend HbA1c to evaluate for diabetes and how well it is controlled. - Heparin  at neuro scale gtt. - Recommend DVT ppx - SBP goal -aim for systolic blood pressure less than 180. - Recommend Telemetry monitoring for arrythmia - Recommend bedside swallow screen prior to PO intake. - Stroke education booklet - Recommend PT/OT/SLP consult - Recommend Urine Tox screen.  - Stroke team to follow in AM.  ______________________________________________________________________  Plan discussed with patient and with her daughter at the bedside.  Plan  discussed with Dr. Segars with the hospitalist team overnight on secure chat.  Signed, Jakyah Bradby, MD [facility]

## 2023-11-06 NOTE — Progress Notes (Incomplete)
 PHARMACY - ANTICOAGULATION CONSULT NOTE  Pharmacy Consult for heparin  Indication: stroke  Allergies  Allergen Reactions   Ciprofloxacin Itching, Swelling and Other (See Comments)    Possibly causing tremors?   Crestor  [Rosuvastatin ] Other (See Comments)    Myalgia and back pain   Lisinopril  Cough   Ofev  [Nintedanib] Nausea Only    Patient Measurements: Height: 5' 7 (170.2 cm) Weight: 54.4 kg (120 lb) IBW/kg (Calculated) : 61.6 HEPARIN  DW (KG): 54.4  Vital Signs: Temp: 97.8 F (36.6 C) (10/11 2050) Temp Source: Oral (10/11 2050) BP: 153/79 (10/11 2230) Pulse Rate: 83 (10/11 2230)  Labs: Recent Labs    11/06/23 1258 11/06/23 1259  HGB 10.7*  --   HCT 33.4*  --   PLT 123*  --   APTT  --  26  LABPROT  --  18.4*  INR  --  1.4*  CREATININE 1.01*  --     Estimated Creatinine Clearance: 37.5 mL/min (A) (by C-G formula based on SCr of 1.01 mg/dL (H)).   Medical History: Past Medical History:  Diagnosis Date   Acute bronchitis 12/17/2021   Acute cystitis 02/20/2019   Arthritis    Atrial flutter (HCC)    Breast cancer (HCC)    left  2009 and 2025   Diabetes mellitus without complication (HCC)    Dyspnea    mostly with exertion   GERD (gastroesophageal reflux disease)    History of radiation therapy    Left Lung-08/09/23-10/01/23- Dr. 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 PMH includes afib (eliquis ) and recent diagnosis of adenocarcinoma of the lung (undergoing radiation), recent NSTEMI. Pharmacy consulted to dose heparin  for CVA  -CThead: no acute abnormalities  -MRI: bilateral embolic appearing infarcts -Hgb 10.7, plts 123 -Will monitor with aPTTs given anti-Xa level will be falsely elevated with recent eliquis  use and will  start heparin  gtt at timing of next dose of eliquis   Goal of Therapy:  Heparin  level 0.3-0.5 units/ml aPTT 66-85 seconds Monitor platelets by anticoagulation protocol: Yes   Plan:  No bolus given stroke dosing/recent Eliquis  use Start heparin  infusion at 700 units/hr at 1000 Check aPTT and anti-Xa level in 8 hours and daily while on heparin  Monitor with aPTTs until correlates with heparin  level Continue to monitor H&H and platelets Follow up future anticoagulation plan   Lynwood Poplar, PharmD, BCPS Clinical Pharmacist 11/06/2023 11:31 PM

## 2023-11-06 NOTE — ED Provider Notes (Addendum)
 [name] EMERGENCY DEPARTMENT AT [facility] Provider Note   CSN: [id number] Arrival date & time: 11/06/23  1136     Patient presents with: Weakness   [name] is a 83 y.o. female.   Patient by EMS.  Last known normal was yesterday at 1 PM.  But by history sounds as if slurred speech started yesterday and resolved.  No slurred speech currently.  Weakness started this morning but not necessarily witnessed patient has right sided weakness.  Patient was admitted for a non-STEMI MI on October 4.  Patient is on Eliquis .  Based on the timing of the last known normal and the fact that she is on Eliquis  patient is not a TNK candidate.  Also at this time does not appear to be a candidate for IR intervention.  Patient without any speech or visual problems currently.  Past medical history significant for atrial fibrillation this is probably the reason why she is on Eliquis .  Patient is on Cardizem .  Past medical history significant for hypertension diabetes hypothyroidism atrial flutter and a history of non-STEMI last weekend.       Prior to Admission medications   Medication Sig Start Date End Date Taking? Authorizing Provider  Accu-Chek Softclix Lancets lancets For use when checking blood sugars ICD-10: E11.69 Once a day alternating mornings and evenings before meals; Duration: 100 days 10/27/22   [provider]  Accu-Chek Softclix Lancets lancets For use when checking blood sugars ICD-10: E11.69 Once a day alternating mornings and evenings before meals; Duration: 100 days 10/27/22   [provider]  albuterol  (ACCUNEB ) 0.63 MG/3ML nebulizer solution Take 3 mLs (0.63 mg total) by nebulization every 6 (six) hours as needed for wheezing. 07/09/23   Ruthell Lauraine FALCON, NP  albuterol  (VENTOLIN  HFA) 108 (90 Base) MCG/ACT inhaler Inhale 2 puffs into the lungs every 6 (six) hours as needed for wheezing or shortness of breath. Patient not taking: Reported on 10/30/2023  07/09/23   Ruthell Lauraine FALCON, NP  anastrozole  (ARIMIDEX ) 1 MG tablet Take 1 tablet (1 mg total) by mouth daily. 04/08/23   [name], [name], MD  apixaban  (ELIQUIS ) 5 MG TABS tablet Take 1 tablet (5 mg total) by mouth 2 (two) times daily. Okay to restart on 06/30/2023 06/29/23   [name], MD  aspirin  EC 81 MG tablet Take 1 tablet (81 mg total) by mouth daily. Swallow whole. 11/05/23   Ladona Heinz, MD  atorvastatin  (LIPITOR) 40 MG tablet Take 1 tablet (40 mg total) by mouth daily. 11/03/23 12/03/23  Cindy Garnette POUR, MD  Blood Glucose Monitoring Suppl (ACCU-CHEK GUIDE ME) w/Device KIT For use when checking blood sugars as directed DX: E11.69 10/27/22   [provider]  diclofenac  Sodium (VOLTAREN ) 1 % GEL Apply 1 application. topically 4 (four) times daily as needed (pain).    [provider]  diltiazem  (CARDIZEM  CD) 120 MG 24 hr capsule Take 1 capsule (120 mg total) by mouth daily. 01/07/23   Dunn, Dayna N, PA-C  gabapentin  (NEURONTIN ) 300 MG capsule Take 300 mg by mouth 3 (three) times daily.    [provider]  glimepiride  (AMARYL ) 1 MG tablet Take 1 mg by mouth daily with breakfast.    [provider]  hydroxychloroquine  (PLAQUENIL ) 200 MG tablet Take 200 mg by mouth daily.    [provider]  Ipratropium-Albuterol  (COMBIVENT RESPIMAT) 20-100 MCG/ACT AERS respimat Inhale 1 puff into the lungs every 6 (six) hours as needed for shortness of breath. 11/16/17  [provider]  levothyroxine  (SYNTHROID ) 100 MCG tablet Take 100 mcg by mouth daily. 02/05/21   [provider]  losartan -hydrochlorothiazide  (HYZAAR) 50-12.5 MG tablet Take 1 tablet by mouth daily. Patient not taking: Reported on 10/31/2023    [provider]  omeprazole (PRILOSEC) 20 MG capsule Take 20 mg by mouth daily as needed (Indigestion). 12/25/22   [provider]  spironolactone  (ALDACTONE ) 25 MG tablet Take 0.5 tablets (12.5 mg total) by mouth daily. 01/07/23    Dunn, Dayna N, PA-C    Allergies: Ciprofloxacin, Crestor  [rosuvastatin ], Lisinopril , and Ofev  [nintedanib]    Review of Systems  Constitutional:  Negative for chills and fever.  HENT:  Negative for ear pain and sore throat.   Eyes:  Negative for pain and visual disturbance.  Respiratory:  Negative for cough and shortness of breath.   Cardiovascular:  Negative for chest pain and palpitations.  Gastrointestinal:  Negative for abdominal pain and vomiting.  Genitourinary:  Negative for dysuria and hematuria.  Musculoskeletal:  Negative for arthralgias and back pain.  Skin:  Negative for color change and rash.  Neurological:  Positive for weakness and numbness. Negative for seizures and syncope.  All other systems reviewed and are negative.   Updated Vital Signs BP 135/72   Pulse 78   Temp 97.8 F (36.6 C) (Oral)   Resp 18   Ht 1.702 m (5' 7)   Wt 54.4 kg   SpO2 98%   BMI 18.79 kg/m   Physical Exam Vitals and nursing note reviewed.  Constitutional:      General: She is not in acute distress.    Appearance: Normal appearance. She is well-developed.  HENT:     Head: Normocephalic and atraumatic.  Eyes:     Extraocular Movements: Extraocular movements intact.     Conjunctiva/sclera: Conjunctivae normal.     Pupils: Pupils are equal, round, and reactive to light.  Cardiovascular:     Rate and Rhythm: Normal rate and regular rhythm.     Heart sounds: No murmur heard. Pulmonary:     Effort: Pulmonary effort is normal. No respiratory distress.     Breath sounds: Normal breath sounds.  Abdominal:     Palpations: Abdomen is soft.     Tenderness: There is no abdominal tenderness.  Musculoskeletal:        General: No swelling.     Cervical back: Normal range of motion and neck supple.  Skin:    General: Skin is warm and dry.     Capillary Refill: Capillary refill takes less than 2 seconds.  Neurological:     Mental Status: She is alert and oriented to person, place, and  time.     Cranial Nerves: No cranial nerve deficit.     Sensory: Sensory deficit present.     Motor: Weakness present.     Comments: Patient with significant right upper extremity and right lower extremity weakness and seems to have numbness.  Psychiatric:        Mood and Affect: Mood normal.     (all labs ordered are listed, but only abnormal results are displayed) Labs Reviewed  COMPREHENSIVE METABOLIC PANEL WITH GFR  CBC  URINALYSIS, ROUTINE W REFLEX MICROSCOPIC  PROTIME-INR  APTT  DIFFERENTIAL  ETHANOL  RAPID URINE DRUG SCREEN, HOSP PERFORMED  CBG MONITORING, ED  CBG MONITORING, ED    EKG: None  Radiology: No results found.   Procedures   Medications Ordered in the ED - No data to display  Medical Decision Making Amount and/or Complexity of Data Reviewed Labs: ordered. Radiology: ordered.  Risk Decision regarding hospitalization.   Patient with significant right upper extremity right lower extremity weakness consistent with CVA.  Patient is on Eliquis  and based on the last known normal at 1 PM yesterday patient is not a candidate for TNK.  Also based on current symptoms and candidate for IR intervention.  Will order stroke order set.  CRITICAL CARE Performed by: Dajohn Ellender Total critical care time: 60 minutes Critical care time was exclusive of separately billable procedures and treating other patients. Critical care was necessary to treat or prevent imminent or life-threatening deterioration. Critical care was time spent personally by me on the following activities: development of treatment plan with patient and/or surrogate as well as nursing, discussions with consultants, evaluation of patient's response to treatment, examination of patient, obtaining history from patient or surrogate, ordering and performing treatments and interventions, ordering and review of laboratory studies, ordering and review of radiographic  studies, pulse oximetry and re-evaluation of patient's condition.  Patient's INR 1.4.  Alcohol  less than 15 complete metabolic panel sodium 133 GFR 56 creatinine 1.01.  Electrolytes otherwise normal blood sugar normal LFTs normal.  CBC white count 4.8 hemoglobin 10.7 platelets 123.  Urinalysis negative blood sugar was 76 on fingerstick.  CT head no acute intercranial abnormality.  Based on this patient will need MRI will recheck to see if the weakness is still there if it is we will contact neurology and hospitalist for admission.  Will discuss with neurology whether they want CT a head and neck or just MRI.  Or both.  Have contacted hospitalist for admission.  Patient's right upper extremity strength is improved some, but not normal.  But  still significantly weak in the right lower extremity.  Neurology recommending MRA with and without head and neck.  Ordered.  Dr. Matthews will see patient in consultation.  Final diagnoses:  Cerebrovascular accident (CVA), unspecified mechanism [facility])    ED Discharge Orders     None          Geraldene Hamilton, MD 11/06/23 1231    Geraldene Hamilton, MD 11/06/23 1357    Geraldene Hamilton, MD 11/06/23 1357    Geraldene Hamilton, MD 11/06/23 1409    Geraldene Hamilton, MD 11/06/23 1420  Discussed with daughter at home.  Last known normal sounds like it was 8 PM last evening when she was able to still walk with her walker this morning when she got up the right side was not moving well at all.    Hideo Googe, MD 11/06/23 1451

## 2023-11-06 NOTE — ED Notes (Signed)
 Daughter [name] Griffiths [phone] would like an update asap

## 2023-11-06 NOTE — ED Notes (Signed)
 Provided patient with sandwich and drink. Daughter is at bedside.

## 2023-11-06 NOTE — Consult Note (Shared)
 NEUROLOGY CONSULT NOTE   Date of service: November 06, 2023 Patient Name: [name] MRN:  [id number] DOB:  [birth year] Chief Complaint: Dysarthria and right-sided weakness Requesting Provider: Claudene Maximino LABOR, MD  History of Present Illness  [name] is a 83 y.o. female with hx of A-fib on Eliquis , hypertension, hypothyroidism, diabetes, breast cancer, lung cancer, usual interstitial pneumonia and recent non-STEMI who presents with acute onset dysarthria and right-sided weakness.  LKW: 10/10 1300 Modified rankin score: {Modified Rankin Scale:21264} IV Thrombolysis: No, patient is on Eliquis  EVT: No, exam not consistent with LVO  NIHSS components Score: Comment  1a Level of Conscious 0[]  1[]  2[]  3[]      1b LOC Questions 0[]  1[]  2[]       1c LOC Commands 0[]  1[]  2[]       2 Best Gaze 0[]  1[]  2[]       3 Visual 0[]  1[]  2[]  3[]      4 Facial Palsy 0[]  1[]  2[]  3[]      5a Motor Arm - left 0[]  1[]  2[]  3[]  4[]  UN[]    5b Motor Arm - Right 0[]  1[]  2[]  3[]  4[]  UN[]    6a Motor Leg - Left 0[]  1[]  2[]  3[]  4[]  UN[]    6b Motor Leg - Right 0[]  1[]  2[]  3[]  4[]  UN[]    7 Limb Ataxia 0[]  1[]  2[]  UN[]      8 Sensory 0[]  1[]  2[]  UN[]      9 Best Language 0[]  1[]  2[]  3[]      10 Dysarthria 0[]  1[]  2[]  UN[]      11 Extinct. and Inattention 0[]  1[]  2[]       TOTAL:       ROS  Comprehensive ROS performed and pertinent positives documented in HPI   Past History   Past Medical History:  Diagnosis Date   Acute bronchitis 12/17/2021   Acute cystitis 02/20/2019   Arthritis    Atrial flutter (HCC)    Breast cancer (HCC)    left  2009 and 2025   Diabetes mellitus without complication (HCC)    Dyspnea    mostly with exertion   GERD (gastroesophageal reflux disease)    History of radiation therapy    Left Lung-08/09/23-10/01/23- Dr. 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[facility])    Lupus    Pneumonia 07/31/2021    Past Surgical History:  Procedure  Laterality Date   BREAST BIOPSY Left 01/15/2023   US  LT BREAST BX W LOC DEV 1ST LESION IMG BX SPEC US  GUIDE 01/15/2023 [facility] MAMMOGRAPHY   BREAST LUMPECTOMY Left 2009   BRONCHIAL BIOPSY  06/29/2023   Procedure: BRONCHOSCOPY, WITH BIOPSY;  Surgeon: [name], MD;  Location: [facility] ENDOSCOPY;  Service: Pulmonary;;   BRONCHIAL BRUSHINGS  06/29/2023   Procedure: BRONCHOSCOPY, WITH BRUSH BIOPSY;  Surgeon: [name], MD;  Location: [facility] ENDOSCOPY;  Service: Pulmonary;;   BRONCHIAL NEEDLE ASPIRATION BIOPSY  06/29/2023   Procedure: BRONCHOSCOPY, WITH NEEDLE ASPIRATION BIOPSY;  Surgeon: [name], MD;  Location: [facility] ENDOSCOPY;  Service: Pulmonary;;   IR 3D INDEPENDENT WKST  05/29/2021   IR ANGIO INTRA EXTRACRAN SEL COM CAROTID INNOMINATE BILAT MOD SED  05/29/2021   IR ANGIO VERTEBRAL SEL VERTEBRAL BILAT MOD SED  05/29/2021   LEFT HEART CATH AND CORONARY ANGIOGRAPHY N/A 11/01/2023   Procedure: LEFT HEART CATH AND CORONARY ANGIOGRAPHY;  Surgeon: Wonda Sharper, MD;  Location: [facility] INVASIVE CV LAB;  Service: Cardiovascular;  Laterality: N/A;   SIMPLE MASTECTOMY WITH  AXILLARY SENTINEL NODE BIOPSY Left 03/31/2023   Procedure: LEFT MASTECTOMY;  Surgeon: Vernetta Berg, MD;  Location: [facility] OR;  Service: General;  Laterality: Left;  LMA PEC BLOCK   VIDEO BRONCHOSCOPY WITH ENDOBRONCHIAL NAVIGATION Left 06/29/2023   Procedure: VIDEO BRONCHOSCOPY WITH ENDOBRONCHIAL NAVIGATION;  Surgeon: [name], MD;  Location: [facility] ENDOSCOPY;  Service: Pulmonary;  Laterality: Left;   VIDEO BRONCHOSCOPY WITH ENDOBRONCHIAL ULTRASOUND Bilateral 06/29/2023   Procedure: BRONCHOSCOPY, WITH EBUS;  Surgeon: [name], MD;  Location: [name] ENDOSCOPY;  Service: Pulmonary;  Laterality: Bilateral;    Family History: Family History  Problem Relation Age of Onset   Cancer Father        prostate   Prostate cancer Father 20 - 9       metaststic   Asthma Brother    Cancer Daughter        breast   Breast cancer Daughter 67     Social History  reports that she has never smoked. She has never been exposed to tobacco smoke. She does not have any smokeless tobacco history on file. She reports that she does not drink alcohol  and does not use drugs.  Allergies  Allergen Reactions   Ciprofloxacin Itching, Swelling and Other (See Comments)    Possibly causing tremors?   Crestor  [Rosuvastatin ] Other (See Comments)    Myalgia and back pain   Lisinopril  Cough   Ofev  [Nintedanib] Nausea Only    Medications   Current Facility-Administered Medications:    [START ON 11/07/2023]  stroke: early stages of recovery book, , Does not apply, Once, Claudene, Rondell A, MD   0.9 %  sodium chloride  infusion, , Intravenous, Continuous, Claudene Reeves A, MD, Last Rate: 40 mL/hr at 11/06/23 1625, New Bag at 11/06/23 1625   acetaminophen  (TYLENOL ) tablet 650 mg, 650 mg, Oral, Q4H PRN **OR** acetaminophen  (TYLENOL ) 160 MG/5ML solution 650 mg, 650 mg, Per Tube, Q4H PRN **OR** acetaminophen  (TYLENOL ) suppository 650 mg, 650 mg, Rectal, Q4H PRN, Claudene, Rondell A, MD   albuterol  (ACCUNEB ) nebulizer solution 0.63 mg, 1 ampule, Nebulization, Q6H PRN, Claudene, Rondell A, MD   anastrozole  (ARIMIDEX ) tablet 1 mg, 1 mg, Oral, Daily, Smith, Rondell A, MD   apixaban  (ELIQUIS ) tablet 5 mg, 5 mg, Oral, BID, Smith, Rondell A, MD   aspirin  EC tablet 81 mg, 81 mg, Oral, Daily, Smith, Rondell A, MD   atorvastatin  (LIPITOR) tablet 40 mg, 40 mg, Oral, Daily, Smith, Rondell A, MD   gabapentin  (NEURONTIN ) capsule 300 mg, 300 mg, Oral, TID, Smith, Rondell A, MD   hydroxychloroquine  (PLAQUENIL ) tablet 200 mg, 200 mg, Oral, Daily, Smith, Rondell A, MD   levothyroxine  (SYNTHROID ) tablet 100 mcg, 100 mcg, Oral, Daily, Smith, Rondell A, MD   senna-docusate (Senokot-S) tablet 1 tablet, 1 tablet, Oral, QHS PRN, Claudene Reeves LABOR, MD  Current Outpatient Medications:    albuterol  (ACCUNEB ) 0.63 MG/3ML nebulizer solution, Take 3 mLs (0.63 mg total) by nebulization every  6 (six) hours as needed for wheezing., Disp: 75 mL, Rfl: 12   albuterol  (VENTOLIN  HFA) 108 (90 Base) MCG/ACT inhaler, Inhale 2 puffs into the lungs every 6 (six) hours as needed for wheezing or shortness of breath. (Patient not taking: Reported on 10/30/2023), Disp: 8.5 g, Rfl: 2   anastrozole  (ARIMIDEX ) 1 MG tablet, Take 1 tablet (1 mg total) by mouth daily., Disp: 90 tablet, Rfl: 3   apixaban  (ELIQUIS ) 5 MG TABS tablet, Take 1 tablet (5 mg total) by mouth 2 (two) times daily. Okay to restart  on 06/30/2023, Disp: , Rfl:    aspirin  EC 81 MG tablet, Take 1 tablet (81 mg total) by mouth daily. Swallow whole., Disp: , Rfl:    atorvastatin  (LIPITOR) 40 MG tablet, Take 1 tablet (40 mg total) by mouth daily., Disp: 30 tablet, Rfl: 0   diclofenac  Sodium (VOLTAREN ) 1 % GEL, Apply 1 application. topically 4 (four) times daily as needed (pain)., Disp: , Rfl:    diltiazem  (CARDIZEM  CD) 120 MG 24 hr capsule, Take 1 capsule (120 mg total) by mouth daily., Disp: 90 capsule, Rfl: 3   gabapentin  (NEURONTIN ) 300 MG capsule, Take 300 mg by mouth 3 (three) times daily., Disp: , Rfl:    glimepiride  (AMARYL ) 1 MG tablet, Take 1 mg by mouth daily with breakfast., Disp: , Rfl:    hydroxychloroquine  (PLAQUENIL ) 200 MG tablet, Take 200 mg by mouth daily., Disp: , Rfl:    Ipratropium-Albuterol  (COMBIVENT RESPIMAT) 20-100 MCG/ACT AERS respimat, Inhale 1 puff into the lungs every 6 (six) hours as needed for shortness of breath., Disp: , Rfl:    levothyroxine  (SYNTHROID ) 100 MCG tablet, Take 100 mcg by mouth daily., Disp: , Rfl:    losartan -hydrochlorothiazide  (HYZAAR) 50-12.5 MG tablet, Take 1 tablet by mouth daily. (Patient not taking: Reported on 10/31/2023), Disp: , Rfl:    omeprazole (PRILOSEC) 20 MG capsule, Take 20 mg by mouth daily as needed (Indigestion)., Disp: , Rfl:    spironolactone  (ALDACTONE ) 25 MG tablet, Take 0.5 tablets (12.5 mg total) by mouth daily., Disp: 45 tablet, Rfl: 3  Vitals   Vitals:   11/06/23 1153  11/06/23 1204 11/06/23 1205  BP:   135/72  Pulse:   78  Resp:   18  Temp:   97.8 F (36.6 C)  TempSrc:   Oral  SpO2: 99%  98%  Weight:  54.4 kg   Height:  5' 7 (1.702 m)     Body mass index is 18.79 kg/m.   Physical Exam   Constitutional: Appears well-developed and well-nourished. *** Psych: Affect appropriate to situation. *** Eyes: No scleral injection. *** HENT: No OP obstruction. *** Head: Normocephalic. *** Cardiovascular: Normal rate and regular rhythm. *** Respiratory: Effort normal, non-labored breathing. *** GI: Soft.  No distension. There is no tenderness. *** Skin: WDI. ***  Neurologic Examination   ***  Labs/Imaging/Neurodiagnostic studies   CBC:  Recent Labs  Lab 11-03-2023 0517 11/06/23 1258  WBC 6.2 4.8  NEUTROABS  --  3.9  HGB 9.9* 10.7*  HCT 30.4* 33.4*  MCV 86.9 88.4  PLT 128* 123*   Basic Metabolic Panel:  Lab Results  Component Value Date   NA 133 (L) 11/06/2023   K 3.8 11/06/2023   CO2 24 11/06/2023   GLUCOSE 95 11/06/2023   BUN 10 11/06/2023   CREATININE 1.01 (H) 11/06/2023   CALCIUM  9.4 11/06/2023   GFRNONAA 56 (L) 11/06/2023   GFRAA 55 (L) 02/20/2019   Lipid Panel:  Lab Results  Component Value Date   LDLCALC 90 11/03/2023   HgbA1c:  Lab Results  Component Value Date   HGBA1C 6.8 (H) 03/26/2023   Urine Drug Screen: No results found for: LABOPIA, COCAINSCRNUR, LABBENZ, AMPHETMU, THCU, LABBARB  Alcohol  Level     Component Value Date/Time   [facility] <15 11/06/2023 1259   INR  Lab Results  Component Value Date   INR 1.4 (H) 11/06/2023   APTT  Lab Results  Component Value Date   APTT 26 11/06/2023    CT Head without contrast(Personally reviewed): No  acute abnormalities  MR Angio head without contrast and MRA neck(Personally reviewed): No LVO, no significant stenosis.  MRI Brain(Personally reviewed): 1. Multiple acute infarcts in a bilateral watershed distribution involving the convexities, inferior  right frontal lobe, right caudate head, posterior parietal and occipital lobes, right lateral pons, and posterior cerebellum. Findings suggest a central embolic source 2. Confluent periventricular T2/FLAIR hyperintensities bilaterally, suggestive of chronic microvascular ischemic change.  ASSESSMENT   [name] is a 83 y.o. female with hx of A-fib on Eliquis , hypertension, hypothyroidism, diabetes, breast cancer, lung cancer, usual interstitial pneumonia and recent non-STEMI who presents with acute onset dysarthria and right-sided weakness.  CT head revealed no acute abnormalities, and MRI brain reveals***  RECOMMENDATIONS  Stroke/TIA Workup  - Admit for stroke workup - Permissive HTN x48 hrs from sx onset goal BP <220/110. PRN labetalol or hydralazine  if BP above these parameters. Avoid oral antihypertensives. - CTA/MRA if not already obtained - TTE  - Check A1c and LDL + add statin per guidelines - Continue anticoagulation with Eliquis  - q4 hr neuro checks - STAT head CT for any change in neuro exam - Tele - PT/OT/SLP - Stroke education - Amb referral to neurology upon discharge   ______________________________________________________________________  Patient seen by NP and then by MD, MD to edit note as needed.  Signed, Cortney E Everitt Clint Kill, NP [facility]

## 2023-11-06 NOTE — H&P (Addendum)
 History and Physical    Patient: [name] FMW:[id number] DOB: 1940-04-17 DOA: 11/06/2023 DOS: the patient was seen and examined on 11/06/2023 PCP: [name], MD  Patient coming from: Home via EMS  Chief Complaint:  Chief Complaint  Patient presents with   Weakness   HPI: [name] is a 83 y.o. 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  She began experiencing symptoms yesterday, initially with slurred speech that resolved the same day. This morning, she noticed weakness on the right side upon waking.  Review of records note patient had just recently been hospitalized from 10/4-10/7 after presenting with complaints of chest pain noted to have concern for NSTEMI.  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  She was given a walker, which she has been using since her discharge. She has been taking Eliquis  regularly without missing any doses.  She reports soreness, which she attributes to difficulty sitting up and turning over. She has been awake since 3 AM trying to get out of bed without disturbing others.  In the emergency department patient was noted to be afebrile with stable vital signs.  Labs noted hemoglobin 10.7, platelets 123, sodium 133, BUN 10, creatinine 1.01, and INR is 1.4.  Urinalysis did not show any significant signs for correction.  CT scan of the head did not note any acute abnormality.  She was not a candidate for thrombolytics due to anticoagulation as well as  being outside the window..   Review of Systems: As mentioned in the history of present illness. All other systems reviewed and are negative. Past Medical History:  Diagnosis Date   Acute bronchitis 12/17/2021   Acute cystitis 02/20/2019   Arthritis    Atrial flutter (HCC)    Breast cancer (HCC)    left  2009 and 2025   Diabetes mellitus without complication (HCC)    Dyspnea    mostly with exertion   GERD (gastroesophageal reflux disease)    History of radiation therapy    Left Lung-08/09/23-10/01/23- Dr. 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)    Lupus    Pneumonia 07/31/2021   Past Surgical History:  Procedure Laterality Date   BREAST BIOPSY Left 01/15/2023   US  LT BREAST BX W LOC DEV 1ST LESION IMG BX SPEC US  GUIDE 01/15/2023 [facility]   BREAST LUMPECTOMY Left 2009   BRONCHIAL BIOPSY  06/29/2023   Procedure: BRONCHOSCOPY, WITH BIOPSY;  Surgeon: [name], MD;  Location: [facility] ENDOSCOPY;  Service: Pulmonary;;   BRONCHIAL BRUSHINGS  06/29/2023   Procedure: BRONCHOSCOPY, WITH BRUSH BIOPSY;  Surgeon: [name], MD;  Location: [facility] ENDOSCOPY;  Service: Pulmonary;;   BRONCHIAL NEEDLE ASPIRATION BIOPSY  06/29/2023   Procedure: BRONCHOSCOPY, WITH NEEDLE ASPIRATION BIOPSY;  Surgeon: [name], MD;  Location: [facility] ENDOSCOPY;  Service: Pulmonary;;   IR 3D INDEPENDENT WKST  05/29/2021   IR ANGIO INTRA EXTRACRAN SEL COM CAROTID INNOMINATE BILAT MOD SED  05/29/2021   IR ANGIO VERTEBRAL SEL VERTEBRAL BILAT MOD SED  05/29/2021   LEFT HEART CATH AND CORONARY ANGIOGRAPHY N/A 11/01/2023   Procedure: LEFT HEART CATH AND CORONARY ANGIOGRAPHY;  Surgeon: Wonda Sharper, MD;  Location: [facility] INVASIVE CV LAB;  Service: Cardiovascular;  Laterality: N/A;   SIMPLE MASTECTOMY WITH AXILLARY SENTINEL NODE BIOPSY Left 03/31/2023   Procedure: LEFT MASTECTOMY;  Surgeon: Vernetta Berg, MD;  Location: [facility] OR;  Service: General;  Laterality: Left;  LMA PEC  BLOCK   VIDEO BRONCHOSCOPY WITH ENDOBRONCHIAL NAVIGATION Left 06/29/2023   Procedure: VIDEO BRONCHOSCOPY WITH ENDOBRONCHIAL NAVIGATION;  Surgeon: [name], MD;  Location: [facility] ENDOSCOPY;  Service: Pulmonary;  Laterality: Left;   VIDEO BRONCHOSCOPY WITH ENDOBRONCHIAL ULTRASOUND Bilateral 06/29/2023   Procedure: BRONCHOSCOPY, WITH EBUS;  Surgeon: [name], MD;  Location: [facility] ENDOSCOPY;  Service: Pulmonary;  Laterality: Bilateral;   Social History:  reports that she has never smoked. She has never been exposed to tobacco smoke. She does not have any smokeless tobacco history on file. She reports that she does not drink alcohol  and does not use drugs.  Allergies  Allergen Reactions   Ciprofloxacin Itching, Swelling and Other (See Comments)    Possibly causing tremors?   Crestor  [Rosuvastatin ] Other (See Comments)    Myalgia and back pain   Lisinopril  Cough   Ofev  [Nintedanib] Nausea Only    Family History  Problem Relation Age of Onset   Cancer Father        prostate   Prostate cancer Father 20 - 15       metaststic   Asthma Brother    Cancer Daughter        breast   Breast cancer Daughter 37    Prior to Admission medications   Medication Sig Start Date End Date Taking? Authorizing Provider  albuterol  (ACCUNEB ) 0.63 MG/3ML nebulizer solution Take 3 mLs (0.63 mg total) by nebulization every 6 (six) hours as needed for wheezing. 07/09/23   Ruthell Lauraine FALCON, NP  albuterol  (VENTOLIN  HFA) 108 (90 Base) MCG/ACT inhaler Inhale 2 puffs into the lungs every 6 (six) hours as needed for wheezing or shortness of breath. Patient not taking: Reported on 10/30/2023 07/09/23   Ruthell Lauraine FALCON, NP  anastrozole  (ARIMIDEX ) 1 MG tablet Take 1 tablet (1 mg total) by mouth daily. 04/08/23   [name], [name], MD  apixaban  (ELIQUIS ) 5 MG TABS tablet Take 1 tablet (5 mg total) by mouth 2 (two) times daily. Okay to restart on 06/30/2023 06/29/23   Byrum, Robert S, MD  aspirin  EC 81 MG tablet Take 1 tablet (81 mg  total) by mouth daily. Swallow whole. 11/05/23   Ladona Heinz, MD  atorvastatin  (LIPITOR) 40 MG tablet Take 1 tablet (40 mg total) by mouth daily. 11/03/23 12/03/23  Cindy Garnette POUR, MD  diclofenac  Sodium (VOLTAREN ) 1 % GEL Apply 1 application. topically 4 (four) times daily as needed (pain).    [provider]  diltiazem  (CARDIZEM  CD) 120 MG 24 hr capsule Take 1 capsule (120 mg total) by mouth daily. 01/07/23   Dunn, Dayna N, PA-C  gabapentin  (NEURONTIN ) 300 MG capsule Take 300 mg by mouth 3 (three) times daily.    [provider]  glimepiride  (AMARYL ) 1 MG tablet Take 1 mg by mouth daily with breakfast.    [provider]  hydroxychloroquine  (PLAQUENIL ) 200 MG tablet Take 200 mg by mouth daily.    [provider]  Ipratropium-Albuterol  (COMBIVENT RESPIMAT) 20-100 MCG/ACT AERS respimat Inhale 1 puff into the lungs every 6 (six) hours as needed for shortness  of breath. 11/16/17   [provider]  levothyroxine  (SYNTHROID ) 100 MCG tablet Take 100 mcg by mouth daily. 02/05/21   [provider]  losartan -hydrochlorothiazide  (HYZAAR) 50-12.5 MG tablet Take 1 tablet by mouth daily. Patient not taking: Reported on 10/31/2023    [provider]  omeprazole (PRILOSEC) 20 MG capsule Take 20 mg by mouth daily as needed (Indigestion). 12/25/22   [provider]  spironolactone  (ALDACTONE ) 25 MG tablet Take 0.5 tablets (12.5 mg total) by mouth daily. 01/07/23   Dunn, Dayna N, PA-C    Physical Exam: Vitals:   11/06/23 1153 11/06/23 1204 11/06/23 1205  BP:   135/72  Pulse:   78  Resp:   18  Temp:   97.8 F (36.6 C)  TempSrc:   Oral  SpO2: 99%  98%  Weight:  54.4 kg   Height:  5' 7 (1.702 m)    Constitutional: Elderly female who appears to be able to follow commands Eyes: PERRL, lids and conjunctivae normal ENMT: Mucous membranes are moist. Posterior pharynx clear of any exudate or lesion.  Fair dentition. Neck: normal, supple   Respiratory: clear to auscultation bilaterally, no wheezing, no crackles. Normal respiratory effort. No accessory muscle use.  Cardiovascular: Regular rate and rhythm, no murmurs / rubs / gallops. No extremity edema. 2+ pedal pulses. No carotid bruits.  Abdomen: no tenderness, no masses palpated.   Bowel sounds positive.  Musculoskeletal: no clubbing / cyanosis. No joint deformity upper and lower extremities. Good ROM, no contractures. Normal muscle tone.  Skin: no rashes, lesions, ulcers. No induration Neurologic: CN 2-12 grossly intact.  Strength in the right upper extremity noted to be 4/5 and strength in the lower right extremity 3/5.  Strength in the left upper and lower extremity 5/5.  Speech appears to be intact. Psychiatric: Normal judgment and insight. Alert and oriented x 3. Normal mood.   Data Reviewed:  EKG reveals sinus rhythm at 72 bpm with QTc of 528.  Reviewed labs, imaging, and pertinent records as documented.  Assessment and Plan:  Embolic CVA Right sided weakness Patient presented with complaints of right sided weakness that started this morning and transient reports of slurred speech yesterday.  CT scan of the head did not note any acute abnormality.  Patient not a candidate for thrombolytics due to being on anticoagulation as well as outside the window. - Admit to a telemetry bed - Neurocheck - Follow-up MRI of the brain along with MRA - PT/OT/speech to evaluation - Continue Eliquis  - Neurology consulted, will follow-up for any further recommendations  Essential hypertension Blood pressures currently maintained. - Allowing for for permissive hypertension at this time - Resume home blood pressure regimen deemed medically appropriate.  Paroxysmal atrial flutter on chronic anticoagulation Patient appears to be in sinus rhythm at this time.  She is on chronic anticoagulation with Eliquis . - Continue Eliquis  - Held Cardizem   Normocytic anemia Hemoglobin noted to  be 10.7 which appears around patient's baseline.  No reports of bleeding. 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  On anastrazole. - Continue anastrazole - Continue outpatient follow-up with oncology  CAD Hyperlipidemia Patient just recently hospitalized with NSTEMI on 10/4.  High-sensitivity troponin is elevated up to 1244.  Patient underwent heart cath which noted severe stenosis at the distal vessel involving the origin of the small left PDA branch and small nondominant RCA with proximal occlusion with collaterals for which medical therapy  was recommended as no targets for PCI.   - Continue atorvastatin  and aspirin   Controlled diabetes mellitus type 2, without long-term use of insulin  On admission glucose noted to be 95.  Last available hemoglobin A1c noted to be 6 when checked on 11/06/2023. - Hold Amaryl  - Heart healthy and carb modified diet  SLE - Continue Plaquenil   Hypothyroidism - Continue levothyroxine   DVT prophylaxis: Eliquis  Advance Care Planning:   Code Status: Full Code    Consults: Neurology   Family Communication: Daughter updated  Severity of Illness: The appropriate patient status for this patient is INPATIENT. Inpatient status is judged to be reasonable and necessary in order to provide the required intensity of service to ensure the patient's safety. The patient's presenting symptoms, physical exam findings, and initial radiographic and laboratory data in the context of their chronic comorbidities is felt to place them at high risk for further clinical deterioration. Furthermore, it is not anticipated that the patient will be medically stable for discharge from the hospital within 2 midnights of admission.   * I certify that at the point of admission it is my clinical judgment that the patient will require inpatient hospital care spanning beyond 2 midnights from the point of admission due to  high intensity of service, high risk for further deterioration and high frequency of surveillance required.*   Author: Maximino DELENA Sharps, MD 11/06/2023 2:14 PM  For on call review [email or web address].

## 2023-11-06 NOTE — ED Triage Notes (Signed)
 Per EMS  Slurred speech Started yesterday Resolved yesterday Weakness Started this morning Right leg weakness Unknown start time Last known well was yesterday at 1pm Decreased coordination Started this morning Unknown time MI last week (Saturday) Unable to place a stent  Due to small vessel

## 2023-11-06 NOTE — ED Notes (Signed)
Patient out of room for testing 

## 2023-11-06 NOTE — ED Notes (Signed)
 Patient back from MRI. RN assisted patient with bedpan. Patient and family updated on plan of care. Patient and family denies other needs at this time.

## 2023-11-07 ENCOUNTER — Inpatient Hospital Stay (HOSPITAL_COMMUNITY)

## 2023-11-07 ENCOUNTER — Encounter (HOSPITAL_COMMUNITY): Payer: Self-pay | Admitting: Internal Medicine

## 2023-11-07 DIAGNOSIS — D649 Anemia, unspecified: Secondary | ICD-10-CM | POA: Diagnosis not present

## 2023-11-07 DIAGNOSIS — I480 Paroxysmal atrial fibrillation: Secondary | ICD-10-CM

## 2023-11-07 DIAGNOSIS — I10 Essential (primary) hypertension: Secondary | ICD-10-CM | POA: Diagnosis not present

## 2023-11-07 DIAGNOSIS — E785 Hyperlipidemia, unspecified: Secondary | ICD-10-CM

## 2023-11-07 DIAGNOSIS — R29709 NIHSS score 9: Secondary | ICD-10-CM | POA: Diagnosis not present

## 2023-11-07 DIAGNOSIS — I6389 Other cerebral infarction: Secondary | ICD-10-CM

## 2023-11-07 DIAGNOSIS — R531 Weakness: Secondary | ICD-10-CM | POA: Diagnosis not present

## 2023-11-07 DIAGNOSIS — I4892 Unspecified atrial flutter: Secondary | ICD-10-CM | POA: Diagnosis not present

## 2023-11-07 DIAGNOSIS — I6340 Cerebral infarction due to embolism of unspecified cerebral artery: Secondary | ICD-10-CM | POA: Diagnosis not present

## 2023-11-07 DIAGNOSIS — I6310 Cerebral infarction due to embolism of unspecified precerebral artery: Secondary | ICD-10-CM | POA: Diagnosis not present

## 2023-11-07 LAB — LIPID PANEL
Cholesterol: 139 mg/dL (ref 0–200)
HDL: 44 mg/dL (ref >40)
LDL Cholesterol: 74 mg/dL (ref 0–99)
Total CHOL/HDL Ratio: 3.2 ratio
Triglycerides: 103 mg/dL (ref <150)
VLDL: 21 mg/dL (ref 0–40)

## 2023-11-07 LAB — CBC
HCT: 30 % — ABNORMAL LOW (ref 36.0–46.0)
Hemoglobin: 9.9 g/dL — ABNORMAL LOW (ref 12.0–15.0)
MCH: 28.5 pg (ref 26.0–34.0)
MCHC: 33 g/dL (ref 30.0–36.0)
MCV: 86.5 fL (ref 80.0–100.0)
Platelets: 117 K/uL — ABNORMAL LOW (ref 150–400)
RBC: 3.47 MIL/uL — ABNORMAL LOW (ref 3.87–5.11)
RDW: 17.2 % — ABNORMAL HIGH (ref 11.5–15.5)
WBC: 4.8 K/uL (ref 4.0–10.5)
nRBC: 0 % (ref 0.0–0.2)

## 2023-11-07 LAB — ECHOCARDIOGRAM COMPLETE
AR max vel: 2.54 cm2
AV Peak grad: 6.7 mmHg
Ao pk vel: 1.29 m/s
Area-P 1/2: 3.3 cm2
Height: 67 in
S' Lateral: 2 cm
Weight: 1920 [oz_av]

## 2023-11-07 LAB — HEPARIN LEVEL (UNFRACTIONATED): Heparin Unfractionated: >1.1 [IU]/mL — ABNORMAL HIGH (ref 0.30–0.70)

## 2023-11-07 LAB — GLUCOSE, CAPILLARY: Glucose-Capillary: 109 mg/dL — ABNORMAL HIGH (ref 70–99)

## 2023-11-07 LAB — APTT: aPTT: 84 s — ABNORMAL HIGH (ref 24–36)

## 2023-11-07 MED ORDER — ENSURE PLUS HIGH PROTEIN PO LIQD
237.0000 mL | Freq: Two times a day (BID) | ORAL | Status: DC
Start: 2023-11-08 — End: 2023-11-09
  Administered 2023-11-08 – 2023-11-09 (×3): 237 mL via ORAL

## 2023-11-07 MED ORDER — HEPARIN (PORCINE) 25000 UT/250ML-% IV SOLN
700.0000 [IU]/h | INTRAVENOUS | Status: AC
  Administered 2023-11-07: 700 [IU]/h via INTRAVENOUS
  Filled 2023-11-07 (×2): qty 250

## 2023-11-07 NOTE — Progress Notes (Addendum)
 STROKE TEAM PROGRESS NOTE    SIGNIFICANT HOSPITAL EVENTS  10/11: Presented to ED due to transient dysarthria.  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  Patient undergoing radiation for adenocarcinoma of lung.  Recent cardiac cath with discharge from hospital 10/8.  Patient daughter endorsed missing multiple doses of Eliquis  10/8 and 10/9.  INTERIM HISTORY/SUBJECTIVE  Son at bedside. Patient sitting up in bed.  Neuro exam consistent.    OBJECTIVE  CBC    Component Value Date/Time   WBC 4.8 11/07/2023 0653   RBC 3.47 (L) 11/07/2023 0653   HGB 9.9 (L) 11/07/2023 0653   HGB 11.4 (L) 08/31/2023 1349   HGB 11.7 01/07/2023 1439   HCT 30.0 (L) 11/07/2023 0653   HCT 36.4 01/07/2023 1439   PLT 117 (L) 11/07/2023 0653   PLT 203 08/31/2023 1349   PLT 257 01/07/2023 1439   MCV 86.5 11/07/2023 0653   MCV 89 01/07/2023 1439   MCH 28.5 11/07/2023 0653   MCHC 33.0 11/07/2023 0653   RDW 17.2 (H) 11/07/2023 0653   RDW 13.9 01/07/2023 1439   LYMPHSABS 0.3 (L) 11/06/2023 1258   LYMPHSABS 1.7 04/25/2022 1223   MONOABS 0.5 11/06/2023 1258   EOSABS 0.1 11/06/2023 1258   EOSABS 0.1 04/25/2022 1223   BASOSABS 0.0 11/06/2023 1258   BASOSABS 0.0 04/25/2022 1223    BMET    Component Value Date/Time   NA 133 (L) 11/06/2023 1258   NA 138 01/07/2023 1439   K 3.8 11/06/2023 1258   CL 99 11/06/2023 1258   CO2 24 11/06/2023 1258   GLUCOSE 95 11/06/2023 1258   BUN 10 11/06/2023 1258   BUN 12 01/07/2023 1439   CREATININE 1.01 (H) 11/06/2023 1258   CREATININE 1.10 (H) 08/31/2023 1349   CALCIUM  9.4 11/06/2023 1258   EGFR 48 (L) 01/07/2023 1439   GFRNONAA 56 (L) 11/06/2023 1258   GFRNONAA 50 (L) 08/31/2023 1349    IMAGING past 24 hours MR BRAIN WO CONTRAST Result Date: 11/06/2023 EXAM: MR Brain without Intravenous Contrast. CLINICAL HISTORY: Stroke, follow up. Episodes of slurred speech yesterday. New onset right-sided weakness today.  TECHNIQUE: Magnetic resonance images of the brain without intravenous contrast in multiple planes. CONTRAST: Without. COMPARISON: None provided. FINDINGS: BRAIN: No intracranial mass or hemorrhage. No midline shift or extra-axial fluid collection. No cerebellar tonsillar ectopia. The central arterial and venous flow voids are patent. Diffuse scattered punctate foci of restricted diffusion present throughout a watershed distribution bilaterally involving multiple small cortical infarcts over the convexities. Small acute infarcts are present in the inferior right frontal lobe and right caudate head. Multiple cortical infarcts are present along the posterior parietal lobes and occipital lobes bilaterally. A single right lateral pontine infarct is present. Posterior cerebellar infarcts are present bilaterally as well. T2 and FLAIR hyperintensities. Additional confluent periventricular T2 hyperintensities are present bilaterally. VENTRICLES: No hydrocephalus. ORBITS: The orbits are normal. SINUSES AND MASTOIDS: The sinuses and mastoid air cells are clear. BONES: No acute fracture or focal osseous lesion. IMPRESSION: 1. Multiple acute infarcts in a bilateral watershed distribution involving the convexities, inferior right frontal lobe, right caudate head, posterior parietal and occipital lobes, right lateral pons, and posterior cerebellum. Findings suggest a central embolic source 2. Confluent periventricular T2/FLAIR hyperintensities bilaterally, suggestive of chronic microvascular ischemic change. Electronically signed by: Lonni Necessary MD 11/06/2023 06:35 PM EDT RP Workstation: [id number]   MR ANGIO HEAD WO CONTRAST Result Date: 11/06/2023 EXAM: MR Angiography Head without intravenous Contrast. 11/06/2023 05:53:00  PM TECHNIQUE: Magnetic resonance angiography images of the head without intravenous contrast. Multiplanar 2D and 3D reformatted images are provided for review. COMPARISON: None provided. CLINICAL  HISTORY: Neuro deficit, acute, stroke suspected. 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 Both A2 segments filled from the left. No significant stenosis of the internal carotid arteries. No significant stenosis of the middle cerebral arteries. No aneurysm. POSTERIOR CIRCULATION: The left vertebral artery is a dominant vessel. No significant stenosis of the posterior cerebral arteries. No significant stenosis of the basilar artery. No aneurysm. IMPRESSION: 1. No significant intracranial arterial stenosis or vessel occlusion Electronically signed by: Lonni Necessary MD 11/06/2023 06:29 PM EDT RP Workstation: [id number]   MR ANGIO NECK WO CONTRAST Result Date: 11/06/2023 EXAM: MRA Neck without 11/06/2023 05:20:00 PM TECHNIQUE: Multiplanar multisequence MRA of the neck was performed without the administration of intravenous contrast. 2D and 3D reformatted images are provided for review. Stenosis of the internal carotid arteries measured using NASCET criteria. COMPARISON: None available CLINICAL HISTORY: Neuro deficit, acute, stroke suspected. FINDINGS: AORTIC ARCH AND ARCH VESSELS: No dissection or arterial injury. No significant stenosis of the brachiocephalic or subclavian arteries. CAROTID ARTERIES: No dissection, arterial injury, or hemodynamically significant stenosis by NASCET criteria. No significant flow disturbance is demonstrated. VERTEBRAL ARTERIES: Flow is antegrade in the vertebral arteries bilaterally. No dissection, arterial injury, or significant stenosis. IMPRESSION: 1. No evidence of dissection, arterial injury, or hemodynamically significant stenosis in the aortic arch, arch vessels, carotid arteries, or vertebral arteries. Electronically signed by: Lonni Necessary MD 11/06/2023 06:16 PM EDT RP Workstation: [id number]   CT HEAD WO CONTRAST Result Date: 11/06/2023 EXAM: CT HEAD WITHOUT CONTRAST 11/06/2023 01:36:00 PM TECHNIQUE: CT of the head was performed  without the administration of intravenous contrast. Automated exposure control, iterative reconstruction, and/or weight based adjustment of the mA/kV was utilized to reduce the radiation dose to as low as reasonably achievable. COMPARISON: CT head without contrast 09/13/2023. CLINICAL HISTORY: Neuro deficit, acute, stroke suspected. Right lower extremity weakness. FINDINGS: BRAIN AND VENTRICLES: Her changes are stable and likely within normal limits for age. Atherosclerotic calcifications are present in the cavernous carotid arteries bilaterally and at the dural margin of both vertebral arteries. No hyperdense vessel is present. No acute hemorrhage. No evidence of acute infarct. No hydrocephalus. No extra-axial collection. No mass effect or midline shift. ORBITS: No acute abnormality. SINUSES: No acute abnormality. SOFT TISSUES AND SKULL: No acute soft tissue abnormality. No skull fracture. IMPRESSION: 1. No acute intracranial abnormality. Electronically signed by: Lonni Necessary MD 11/06/2023 01:50 PM EDT RP Workstation: [id number]    Vitals:   11/07/23 0330 11/07/23 0500 11/07/23 0525 11/07/23 0800  BP: (!) 141/72 (!) 156/66  (!) 147/77  Pulse: 88 75  86  Resp: 20 17  (!) 24  Temp:   97.8 F (36.6 C)   TempSrc:   Oral   SpO2: 99% 100%  99%  Weight:      Height:         PHYSICAL EXAM General:  Alert, well-nourished, well-developed patient in no acute distress Psych:  Mood and affect appropriate for situation CV: Regular rate and rhythm on monitor Respiratory:  Regular, unlabored respirations on room air GI: Abdomen soft and nontender   NEURO:  Mental Status: AA&O, additional time needed for subsequent questions.  Speech/Language: Mild dysarthria and aphasia present, paucity of speech.   Cranial Nerves:  II: PERRL. ?decreased  III, IV, VI: EOMI. Eyelids elevate symmetrically.  V: Sensation is intact to light touch  and symmetrical to face.  VII: Face is symmetrical resting and  smiling VIII: hearing intact to voice. IX, X: Palate elevates symmetrically. Phonation is normal.  KP:Dynloizm shrug 5/5. XII: tongue is midline without fasciculations. Motor:  RUE: 4-/5, mild drift RLE: 4-/5, moderate drift Tone: is normal and bulk is normal Sensation- Intact to light touch bilaterally. Extinction absent to light touch to DSS.   Coordination: FTN intact bilaterally, slower on right.   Gait- deferred  Most Recent NIH: 9    ASSESSMENT/PLAN  [name] is a 83 y.o. female with history of  A-fib on Eliquis , CAD, recently diagnosed adenocarcinoma of the lung undergoing radiation treatment, history of breast cancer diagnosed last year who was recently discharged from the hospital after being admitted for NSTEMI.  She presents with slurred speech and right-sided weakness along with inconsistent responses to visual field testing in the right Hemi visual field. NIH on Admission: 5.  Stroke:  bilateral embolic shower, embolic pattern, etiology:  Afib and missed Eliquis  doses vs hypercoagulable state from malignancy CT Head without contrast negative for a large hypodensity concerning for a large territory infarct or hyperdensity concerning for an ICH MRI Brain Multiple acute infarcts in a bilateral watershed distribution involving the convexities, inferior right frontal lobe, right caudate head, posterior parietal and occipital lobes, right lateral pons, and posterior cerebellum. Findings suggest a central embolic source MRA head and neck No LVO, no significant stenosis. 2D Echo: EF 60%, mild MR, moderate AR LDL 74 HgbA1c 6.0 VTE prophylaxis - heparin  IV Eliquis  5mg  BID (noted missed doses) and ASA 81 prior to admission, now on aspirin  81 mg daily and heparin  IV. Transition to Eliquis  versus bridging to Lovenox . 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 Francyne note 10/7, recommended Eliquis  5 mg twice daily and also adding Plavix daily for 1 month only.  However patient was discharged on Eliquis  only.  Per notes, patient called cardiology office.  Dr. Ladona stated patient should take aspirin  81 mg for 30 days and then stop. Would like cardiology input on final regimen.   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 Note/plan to be edited by MD as needed.    Rocky JAYSON Likes, DNP [facility] Please use AMION for contact information & EPIC for messaging.  ATTENDING NOTE: I reviewed above note and agree with the assessment and plan. Pt was seen and examined.   RN is at the bedside. Pt is awake, alert, eyes open, orientated to age, place, time. No  aphasia, fluent language but paucity of speech and mild psychomotor slowing, following all simple commands. Able to name and repeat. No gaze palsy, tracking bilaterally, visual field full. No facial droop. Tongue midline. LUE and LLE 5/5, RUE 2-/5 and  RLE 2+/5 proximal and distally. Sensation symmetrical bilaterally subjectively, L FTN intact, gait not tested.   For detailed assessment and plan, please refer to above as I have made changes wherever appropriate.   Ary Cummins, MD PhD Stroke Neurology 11/07/2023 6:54 PM    To contact Stroke Continuity provider, please refer to [email or web address]. After hours, contact General Neurology

## 2023-11-07 NOTE — Progress Notes (Signed)

## 2023-11-07 NOTE — Progress Notes (Signed)
 Progress Note   Patient: [name] FMW:[id number] DOB: May 15, 1940 DOA: 11/06/2023     1 DOS: the patient was seen and examined on 11/07/2023   Brief hospital course: [name] is a 83 y.o. 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   MRI brain showed multiple acute infarcts in bilateral watershed distribution involving right frontal lobe right caudate head, posterior bilateral occipital lobes right lateral pons and posterior cerebellum suggesting cardioembolic source.  Neurology evaluated the patient recommended full stroke workup  Assessment and Plan: Acute ischemic stroke Embolic CVA- MRI brain reviewed shows multiple acute infarcts in watershed distribution. Neurology evaluation appreciated. She recently had cath. Echo shows EF 60%, increased RVSP at 36.9 mmHg, mild MR, moderate AR Continue aspirin , statin.  Allow permissive hypertension. She did miss few doses of Eliquis  per daughter.  Heparin  drip infusion per pharmacy protocol. PT OT suggested rehab consult.  Paroxysmal A-flutter- Patient is not compliant with Eliquis . Neurology advised heparin  drip per pharmacy protocol.  Essential hypertension- Cardizem  on hold to allow permissive hypertension.  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 She is at risk for strokes due to her cancer history.  Type 2 diabetes mellitus: A1c 6 Hold oral hypoglycemics. Continue Accu-Cheks, sliding scale insulin .  SLE-on Plaquenil .  Chronic anemia-stable.  Chronic hyponatremia-stable.  Trend sodium.  Hypothyroidism-continue home dose Synthroid     Out of bed to chair. Incentive spirometry. Nursing  supportive care. Fall, aspiration precautions. Diet:  Diet Orders (From admission, onward)     Start     Ordered   11/06/23 1843  Diet heart healthy/carb modified Room service appropriate? Yes; Fluid consistency: Thin  Diet effective now       Question Answer Comment  Room service appropriate? Yes   Fluid consistency: Thin      11/06/23 1842           DVT prophylaxis:   Level of care: Telemetry Medical   Code Status: Full Code  Subjective: Patient is seen and examined today morning.  She has right-sided weakness.  Daughter and family at bedside.  Physical Exam: Vitals:   11/07/23 0800 11/07/23 0921 11/07/23 1215 11/07/23 1310  BP: (!) 147/77  (!) 177/89   Pulse: 86  92   Resp: (!) 24  18   Temp:  98.4 F (36.9 C)  98.8 F (37.1 C)  TempSrc:  Oral  Oral  SpO2: 99%  100%   Weight:      Height:        General - Elderly thin built African-American female, no apparent distress HEENT - PERRLA, EOMI, atraumatic head, non tender sinuses. Lung - Clear, no rales, rhonchi, wheezes. Heart - S1, S2 heard, no murmurs, rubs, no pedal edema. Abdomen - Soft, non tender, bowel sounds good Neuro - Alert, awake and oriented, right-sided weakness Skin - Warm and dry.  Data Reviewed:      Latest Ref Rng & Units 11/07/2023    6:53 AM 11/06/2023   12:58 PM 11/02/2023    5:17 AM  CBC  WBC 4.0 - 10.5 K/uL 4.8  4.8  6.2   Hemoglobin 12.0 - 15.0 g/dL 9.9  89.2  9.9   Hematocrit 36.0 - 46.0 % 30.0  33.4  30.4  Platelets 150 - 400 K/uL 117  123  128       Latest Ref Rng & Units 11/06/2023   12:58 PM 11/02/2023    5:17 AM 11/01/2023    8:05 AM  BMP  Glucose 70 - 99 mg/dL 95  96  94   BUN 8 - 23 mg/dL 10  13  15    Creatinine 0.44 - 1.00 mg/dL 8.98  9.07  9.08   Sodium 135 - 145 mmol/L 133  129  132   Potassium 3.5 - 5.1 mmol/L 3.8  4.1  3.8   Chloride 98 - 111 mmol/L 99  99  98   CO2 22 - 32 mmol/L 24  22  22    Calcium  8.9 - 10.3 mg/dL 9.4  9.0  9.0    ECHOCARDIOGRAM  COMPLETE Result Date: 11/07/2023    ECHOCARDIOGRAM REPORT   Patient Name:   [name] Date of Exam: 11/07/2023 Medical Rec #:  [id number]      Height:       67.0 in Accession #:    [id number]     Weight:       120.0 lb Date of Birth:  [birth year]       BSA:          1.628 m Patient Age:    83 years       BP:           156/66 mmHg Patient Gender: F              HR:           88 bpm. Exam Location:  Inpatient Procedure: 2D Echo, Cardiac Doppler and Color Doppler (Both Spectral and Color            Flow Doppler were utilized during procedure). Indications:    Stroke I63.9  History:        Patient has prior history of Echocardiogram examinations, most                 recent 11/01/2023. CHF, Previous Myocardial Infarction and CAD,                 Stroke and CKD, stage 3, Arrythmias:Atrial Flutter,                 Signs/Symptoms:Dyspnea, Shortness of Breath and Syncope; Risk                 Factors:Hypertension and Diabetes.  Sonographer:    Thea Norlander RCS Referring Phys: MAXIMINO DELENA SHARPS  Sonographer Comments: Image acquisition challenging due to patient body habitus. IMPRESSIONS  1. Left ventricular ejection fraction, by estimation, is 60 to 65%. The left ventricle has normal function. The left ventricle has no regional wall motion abnormalities. There is moderate asymmetric left ventricular hypertrophy of the septal and basal segments. Left ventricular diastolic parameters are indeterminate.  2. Right ventricular systolic function is normal. The right ventricular size is normal. There is mildly elevated pulmonary artery systolic pressure. The estimated right ventricular systolic pressure is 36.9 mmHg.  3. The mitral valve is degenerative. There is nodular thickening at the leatlet tips and loose chordal structure as well. Mild mitral valve regurgitation.  4. The aortic valve is tricuspid. Aortic valve regurgitation is moderate.  5. The inferior vena cava is normal in size with greater than 50% respiratory  variability, suggesting right atrial pressure of 3 mmHg. Comparison(s): Prior images reviewed side by side. LVEF 60-65%. Mild mitral regurgitation with degenerative  valve. Aortic regurgitation is now moderate - no obvious vegetation. FINDINGS  Left Ventricle: Left ventricular ejection fraction, by estimation, is 60 to 65%. The left ventricle has normal function. The left ventricle has no regional wall motion abnormalities. The left ventricular internal cavity size was normal in size. There is  moderate asymmetric left ventricular hypertrophy of the septal and basal segments. Left ventricular diastolic parameters are indeterminate. Right Ventricle: The right ventricular size is normal. No increase in right ventricular wall thickness. Right ventricular systolic function is normal. There is mildly elevated pulmonary artery systolic pressure. The tricuspid regurgitant velocity is 2.91  m/s, and with an assumed right atrial pressure of 3 mmHg, the estimated right ventricular systolic pressure is 36.9 mmHg. Left Atrium: Left atrial size was normal in size. Right Atrium: Right atrial size was normal in size. Pericardium: There is no evidence of pericardial effusion. Presence of epicardial fat layer. Mitral Valve: The mitral valve is degenerative in appearance. Mild mitral valve regurgitation. Tricuspid Valve: The tricuspid valve is grossly normal. Tricuspid valve regurgitation is mild. Aortic Valve: The aortic valve is tricuspid. There is mild aortic valve annular calcification. Aortic valve regurgitation is moderate. Aortic valve peak gradient measures 6.7 mmHg. Pulmonic Valve: The pulmonic valve was grossly normal. Pulmonic valve regurgitation is mild. Aorta: The aortic root and ascending aorta are structurally normal, with no evidence of dilitation. Venous: The inferior vena cava is normal in size with greater than 50% respiratory variability, suggesting right atrial pressure of 3 mmHg. IAS/Shunts: No atrial level shunt  detected by color flow Doppler. Additional Comments: 3D was performed not requiring image post processing on an independent workstation and was indeterminate.  LEFT VENTRICLE PLAX 2D LVIDd:         3.50 cm   Diastology LVIDs:         2.00 cm   LV e' medial:    4.35 cm/s LV PW:         0.90 cm   LV E/e' medial:  17.8 LV IVS:        0.90 cm   LV e' lateral:   9.25 cm/s LVOT diam:     2.20 cm   LV E/e' lateral: 8.4 LV SV:         60 LV SV Index:   37 LVOT Area:     3.80 cm  RIGHT VENTRICLE             IVC RV S prime:     17.90 cm/s  IVC diam: 1.00 cm TAPSE (M-mode): 1.4 cm LEFT ATRIUM             Index       RIGHT ATRIUM          Index LA diam:        2.30 cm 1.41 cm/m  RA Area:     5.05 cm LA Vol (A2C):   15.9 ml 9.77 ml/m  RA Volume:   4.44 ml  2.73 ml/m LA Vol (A4C):   8.8 ml  5.42 ml/m LA Biplane Vol: 11.0 ml 6.76 ml/m  AORTIC VALVE AV Area (Vmax): 2.54 cm AV Vmax:        129.00 cm/s AV Peak Grad:   6.7 mmHg LVOT Vmax:      86.20 cm/s LVOT Vmean:     57.500 cm/s LVOT VTI:       0.159 m  AORTA Ao Root diam: 3.10 cm Ao Asc diam:  3.20 cm MITRAL VALVE  TRICUSPID VALVE MV Area (PHT): 3.30 cm     TR Peak grad:   33.9 mmHg MV Decel Time: 230 msec     TR Vmax:        291.00 cm/s MV E velocity: 77.50 cm/s MV A velocity: 127.00 cm/s  SHUNTS MV E/A ratio:  0.61         Systemic VTI:  0.16 m                             Systemic Diam: 2.20 cm Jayson Sierras MD Electronically signed by Jayson Sierras MD Signature Date/Time: 11/07/2023/12:29:19 PM    Final    MR BRAIN WO CONTRAST Result Date: 11/06/2023 EXAM: MR Brain without Intravenous Contrast. CLINICAL HISTORY: Stroke, follow up. Episodes of slurred speech yesterday. New onset right-sided weakness today. TECHNIQUE: Magnetic resonance images of the brain without intravenous contrast in multiple planes. CONTRAST: Without. COMPARISON: None provided. FINDINGS: BRAIN: No intracranial mass or hemorrhage. No midline shift or extra-axial fluid  collection. No cerebellar tonsillar ectopia. The central arterial and venous flow voids are patent. Diffuse scattered punctate foci of restricted diffusion present throughout a watershed distribution bilaterally involving multiple small cortical infarcts over the convexities. Small acute infarcts are present in the inferior right frontal lobe and right caudate head. Multiple cortical infarcts are present along the posterior parietal lobes and occipital lobes bilaterally. A single right lateral pontine infarct is present. Posterior cerebellar infarcts are present bilaterally as well. T2 and FLAIR hyperintensities. Additional confluent periventricular T2 hyperintensities are present bilaterally. VENTRICLES: No hydrocephalus. ORBITS: The orbits are normal. SINUSES AND MASTOIDS: The sinuses and mastoid air cells are clear. BONES: No acute fracture or focal osseous lesion. IMPRESSION: 1. Multiple acute infarcts in a bilateral watershed distribution involving the convexities, inferior right frontal lobe, right caudate head, posterior parietal and occipital lobes, right lateral pons, and posterior cerebellum. Findings suggest a central embolic source 2. Confluent periventricular T2/FLAIR hyperintensities bilaterally, suggestive of chronic microvascular ischemic change. Electronically signed by: Lonni Necessary MD 11/06/2023 06:35 PM EDT RP Workstation: [id number]   MR ANGIO HEAD WO CONTRAST Result Date: 11/06/2023 EXAM: MR Angiography Head without intravenous Contrast. 11/06/2023 05:53:00 PM TECHNIQUE: Magnetic resonance angiography images of the head without intravenous contrast. Multiplanar 2D and 3D reformatted images are provided for review. COMPARISON: None provided. CLINICAL HISTORY: Neuro deficit, acute, stroke suspected. 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 Both A2 segments filled from the left. No significant stenosis of the internal carotid arteries. No significant stenosis of  the middle cerebral arteries. No aneurysm. POSTERIOR CIRCULATION: The left vertebral artery is a dominant vessel. No significant stenosis of the posterior cerebral arteries. No significant stenosis of the basilar artery. No aneurysm. IMPRESSION: 1. No significant intracranial arterial stenosis or vessel occlusion Electronically signed by: Lonni Necessary MD 11/06/2023 06:29 PM EDT RP Workstation: [id number]   MR ANGIO NECK WO CONTRAST Result Date: 11/06/2023 EXAM: MRA Neck without 11/06/2023 05:20:00 PM TECHNIQUE: Multiplanar multisequence MRA of the neck was performed without the administration of intravenous contrast. 2D and 3D reformatted images are provided for review. Stenosis of the internal carotid arteries measured using NASCET criteria. COMPARISON: None available CLINICAL HISTORY: Neuro deficit, acute, stroke suspected. FINDINGS: AORTIC ARCH AND ARCH VESSELS: No dissection or arterial injury. No significant stenosis of the brachiocephalic or subclavian arteries. CAROTID ARTERIES: No dissection, arterial injury, or hemodynamically significant stenosis by NASCET criteria. No significant flow disturbance is demonstrated. VERTEBRAL ARTERIES: Flow  is antegrade in the vertebral arteries bilaterally. No dissection, arterial injury, or significant stenosis. IMPRESSION: 1. No evidence of dissection, arterial injury, or hemodynamically significant stenosis in the aortic arch, arch vessels, carotid arteries, or vertebral arteries. Electronically signed by: Lonni Necessary MD 11/06/2023 06:16 PM EDT RP Workstation: [id number]   CT HEAD WO CONTRAST Result Date: 11/06/2023 EXAM: CT HEAD WITHOUT CONTRAST 11/06/2023 01:36:00 PM TECHNIQUE: CT of the head was performed without the administration of intravenous contrast. Automated exposure control, iterative reconstruction, and/or weight based adjustment of the mA/kV was utilized to reduce the radiation dose to as low as reasonably achievable. COMPARISON: CT  head without contrast 09/13/2023. CLINICAL HISTORY: Neuro deficit, acute, stroke suspected. Right lower extremity weakness. FINDINGS: BRAIN AND VENTRICLES: Her changes are stable and likely within normal limits for age. Atherosclerotic calcifications are present in the cavernous carotid arteries bilaterally and at the dural margin of both vertebral arteries. No hyperdense vessel is present. No acute hemorrhage. No evidence of acute infarct. No hydrocephalus. No extra-axial collection. No mass effect or midline shift. ORBITS: No acute abnormality. SINUSES: No acute abnormality. SOFT TISSUES AND SKULL: No acute soft tissue abnormality. No skull fracture. IMPRESSION: 1. No acute intracranial abnormality. Electronically signed by: Lonni Necessary MD 11/06/2023 01:50 PM EDT RP Workstation: [id number]    Family Communication: Discussed with patient, family at bedside.  They understand and agree. All questions answered.  Disposition: Status is: Inpatient Remains inpatient appropriate because: Further stroke workup including TEE  Planned Discharge Destination: Rehab     Time spent: 52 minutes  Author: Concepcion Riser, MD 11/07/2023 2:08 PM Secure chat 7am to 7pm For on call review [email or web address].

## 2023-11-07 NOTE — Progress Notes (Signed)
 Echocardiogram 2D Echocardiogram has been performed.  Thea Norlander 11/07/2023, 10:32 AM

## 2023-11-07 NOTE — Plan of Care (Signed)

## 2023-11-07 NOTE — Evaluation (Signed)
 Physical Therapy Evaluation Patient Details Name: NOVAH NESSEL MRN: [id number] DOB: 01-07-1941 Today's Date: 11/07/2023  History of Present Illness  [name] is a 83 y.o. female who presented to [facility] ED 11/06/23 with right-sided weakness and slurred speech. CT scan of the head did not note any acute abnormality. MRI demonstrated bilateral embolic appearing infarcts in a watershed distribution as well as infarcts in bilateral cerebellum. PMHx: arthritis, lupus, ILD, breast cancer s/p mastectomy 03/31/2023, HTN, T2DM, HLD, and frequent UTIs, A-Fib, and adenocarcinoma of the LLL. Of note, recent admission 5/25 for acute PE and new dx of lung cancer & 10/4-10/7 for NSTEMI.   Clinical Impression  Pt admitted with above diagnosis. PTA, pt was modI for functional mobility using a SPC and modI with ADLs/IADLs. She lives with her daughter in a one story house with 1 STE. Pt currently with functional limitations due to the deficits listed below (see PT Problem List). She required mod-maxA for bed mobility and modA for sit<>stand. Pt demonstrated impaired balance with posterior and right lateral lean requiring mod-maxA to stabilize. She is currently limited by RLE hemiplegia, decreased sensation, impaired coordination, and decreased activity tolerance. Pt will benefit from acute skilled PT to increase her independence and safety with mobility to allow discharge. Recommend intensive inpatient follow-up therapy, >3 hours/day.    If plan is discharge home, recommend the following: Two people to help with walking and/or transfers;A lot of help with bathing/dressing/bathroom;Assistance with cooking/housework;Assist for transportation;Help with stairs or ramp for entrance   Can travel by private vehicle        Equipment Recommendations Wheelchair (measurements PT);Wheelchair cushion (measurements PT)  Recommendations for Other Services  Rehab consult    Functional Status Assessment Patient has had a recent  decline in their functional status and demonstrates the ability to make significant improvements in function in a reasonable and predictable amount of time.     Precautions / Restrictions Precautions Precautions: Fall Recall of Precautions/Restrictions: Intact Precaution/Restrictions Comments: SBP <180 Restrictions Weight Bearing Restrictions Per Provider Order: No      Mobility  Bed Mobility Overal bed mobility: Needs Assistance Bed Mobility: Supine to Sit, Sit to Supine     Supine to sit: HOB elevated, Mod assist Sit to supine: Max assist   General bed mobility comments: Pt sat up on L side of the stretcher with increased time and cues for seqeuencing. She was able to advance LLE slowly. Assist to move RLE and advance trunk. Scooted pt fwd with hips until feet flat. Returning to bed eased pt trunk down on L arm, brought BLE back into bed, rolled her onto back, and scooted her up in bed.    Transfers Overall transfer level: Needs assistance Equipment used: None Transfers: Sit to/from Stand Sit to Stand: From elevated surface, Mod assist           General transfer comment: Pt stood from raised stretcher. She held onto PT's upper arm with LUE. PT supported RUE and provided anterior knee block d/t weakness. Powered up with modA. Her RLE appeared to hyperextend. She stood statically for ~30secs with mod-maxA to maintain stability d/t posterior and right lateral lean.    Ambulation/Gait                  Stairs            Wheelchair Mobility     Tilt Bed    Modified Rankin (Stroke Patients Only) Modified Rankin (Stroke Patients Only) Pre-Morbid Rankin Score: No  symptoms Modified Rankin: Severe disability     Balance Overall balance assessment: Needs assistance Sitting-balance support: Single extremity supported, Feet supported Sitting balance-Leahy Scale: Poor Sitting balance - Comments: Pt sat EOB with modA to maintain upright posture. Postural  control: Right lateral lean, Posterior lean Standing balance support: Single extremity supported, During functional activity Standing balance-Leahy Scale: Poor Standing balance comment: Pt dependent on external support of therapist and mod-maxA to maintain brief static stance.                             Pertinent Vitals/Pain Pain Assessment Pain Assessment: No/denies pain    Home Living Family/patient expects to be discharged to:: Private residence Living Arrangements: Children (Daughter) Available Help at Discharge: Family;Available 24 hours/day Type of Home: House Home Access: Stairs to enter Entrance Stairs-Rails: Right;Left;Can reach both Entrance Stairs-Number of Steps: 1   Home Layout: One level Home Equipment: Cane - single point;Tub bench;BSC/3in1;Rollator (4 wheels)      Prior Function Prior Level of Function : Independent/Modified Independent             Mobility Comments: Ambulates using a SPC. Denies falls in the past 38mo. ADLs Comments: ModI with ADLs and IADLs. Doesn't drive.     Extremity/Trunk Assessment   Upper Extremity Assessment Upper Extremity Assessment: Defer to OT evaluation    Lower Extremity Assessment Lower Extremity Assessment: RLE deficits/detail RLE Deficits / Details: Pt with decreased AROM likely secondary to weakness. Performed PROM, WFL, able to achieve appropraite end feels. Pt reported decreased sensation. She was unable to complete heel-to-shin and has significantly slowler rapid alternating movements when attempting to tap feet. RLE Sensation: decreased light touch;decreased proprioception RLE Coordination: decreased gross motor    Cervical / Trunk Assessment Cervical / Trunk Assessment: Other exceptions Cervical / Trunk Exceptions: Weakness  Communication   Communication Communication: Impaired Factors Affecting Communication: Difficulty expressing self;Reduced clarity of speech    Cognition Arousal:  Alert Behavior During Therapy: WFL for tasks assessed/performed                           PT - Cognition Comments: Pt A,Ox4 Following commands: Impaired Following commands impaired: Follows one step commands with increased time, Follows multi-step commands inconsistently, Follows multi-step commands with increased time     Cueing Cueing Techniques: Verbal cues     General Comments General comments (skin integrity, edema, etc.): VSS on RA    Exercises     Assessment/Plan    PT Assessment Patient needs continued PT services  PT Problem List Decreased strength;Decreased activity tolerance;Decreased balance;Decreased mobility;Decreased coordination;Decreased cognition;Decreased knowledge of use of DME;Decreased safety awareness;Decreased knowledge of precautions;Impaired sensation       PT Treatment Interventions DME instruction;Gait training;Stair training;Functional mobility training;Therapeutic activities;Therapeutic exercise;Balance training;Neuromuscular re-education;Cognitive remediation;Patient/family education;Wheelchair mobility training    PT Goals (Current goals can be found in the Care Plan section)  Acute Rehab PT Goals Patient Stated Goal: Regain strength and balance PT Goal Formulation: With patient/family Time For Goal Achievement: 11/21/23 Potential to Achieve Goals: Good    Frequency Min 3X/week     Co-evaluation               AM-PAC PT 6 Clicks Mobility  Outcome Measure Help needed turning from your back to your side while in a flat bed without using bedrails?: A Lot Help needed moving from lying on your back to sitting on the side  of a flat bed without using bedrails?: A Lot Help needed moving to and from a bed to a chair (including a wheelchair)?: A Lot Help needed standing up from a chair using your arms (e.g., wheelchair or bedside chair)?: A Lot Help needed to walk in hospital room?: Total Help needed climbing 3-5 steps with a  railing? : Total 6 Click Score: 10    End of Session Equipment Utilized During Treatment: Gait belt Activity Tolerance: Patient tolerated treatment well Patient left: in bed;with call bell/phone within reach;with family/visitor present Nurse Communication: Mobility status PT Visit Diagnosis: Hemiplegia and hemiparesis;Other symptoms and signs involving the nervous system (R29.898);Difficulty in walking, not elsewhere classified (R26.2);Unsteadiness on feet (R26.81) Hemiplegia - Right/Left: Right Hemiplegia - dominant/non-dominant: Dominant Hemiplegia - caused by: Cerebral infarction    Time: 0812-0836 PT Time Calculation (min) (ACUTE ONLY): 24 min   Charges:   PT Evaluation $PT Eval Moderate Complexity: 1 Mod   PT General Charges $$ ACUTE PT VISIT: 1 Visit         Randall SAUNDERS, PT, DPT Acute Rehabilitation Services Office: [phone] Secure Chat Preferred  Delon CHRISTELLA Callander 11/07/2023, 10:31 AM

## 2023-11-07 NOTE — ED Notes (Signed)
 Floor notified patient coming up

## 2023-11-07 NOTE — Progress Notes (Signed)
 PHARMACY - ANTICOAGULATION CONSULT NOTE  Pharmacy Consult for heparin  Indication: stroke  Allergies  Allergen Reactions   Ciprofloxacin Itching, Swelling and Other (See Comments)    Possibly causing tremors?   Crestor  [Rosuvastatin ] Other (See Comments)    Myalgia and back pain   Lisinopril  Cough   Ofev  [Nintedanib] Nausea Only    Patient Measurements: Height: 5' 7 (170.2 cm) Weight: 54.4 kg (120 lb) IBW/kg (Calculated) : 61.6 HEPARIN  DW (KG): 54.4  Vital Signs: Temp: 99.5 F (37.5 C) (10/12 1531) Temp Source: Oral (10/12 1531) BP: 152/76 (10/12 1531) Pulse Rate: 88 (10/12 1531)  Labs: Recent Labs    11/06/23 1258 11/06/23 1259 11/07/23 0653 11/07/23 1826  HGB 10.7*  --  9.9*  --   HCT 33.4*  --  30.0*  --   PLT 123*  --  117*  --   APTT  --  26  --  84*  LABPROT  --  18.4*  --   --   INR  --  1.4*  --   --   HEPARINUNFRC  --   --   --  >1.10*  CREATININE 1.01*  --   --   --     Estimated Creatinine Clearance: 37.5 mL/min (A) (by C-G formula based on SCr of 1.01 mg/dL (H)).   Assessment: 63 yoF presented with right sided weakness. PMH includes afib (eliquis ) and recent diagnosis of adenocarcinoma of the lung (undergoing radiation), recent NSTEMI. Pharmacy consulted to dose heparin  for CVA  -CT head: no acute abnormalities  -MRI: bilateral embolic appearing infarcts (possible cardio-embolic source) -Will monitor with aPTTs given anti-Xa level will be falsely elevated with recent eliquis  use and will start heparin  gtt at timing of next dose of eliquis .  Heparin  level >1.1 (due to Eliquis ), aPTT 84 sec (therapeutic) on 700 units/hr. No bleeding noted.  Goal of Therapy:  Heparin  level 0.3-0.5 units/ml aPTT 66-85 seconds Monitor platelets by anticoagulation protocol: Yes   Plan:  Contiue heparin  infusion at 700 units/hr  F/u aPTT in a.m. (~8 hour) to confirm therapeutic  Vito Ralph, PharmD, BCPS Please see amion for complete clinical pharmacist phone  list 11/07/2023 7:17 PM

## 2023-11-07 NOTE — Progress Notes (Signed)
 Progress Note   Patient: [name] FMW:[id number] DOB: 1940-12-11 DOA: 11/06/2023     1 DOS: the patient was seen and examined on 11/07/2023   Brief hospital course: [name] is a 83 y.o. 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   MRI brain showed multiple acute infarcts in bilateral watershed distribution involving right frontal lobe right caudate head, posterior bilateral occipital lobes right lateral pons and posterior cerebellum suggesting cardioembolic source.  Neurology evaluated the patient recommended full stroke workup  Assessment and Plan: Acute ischemic stroke Embolic CVA- MRI brain reviewed shows multiple acute infarcts in watershed distribution. Neurology evaluation appreciated, patient will need TEE which will be arranged by cardiology tomorrow. Echo shows EF 60%, increased RVSP at 36.9 mmHg, mild MR, moderate AR Continue aspirin , statin.  Allow permissive hypertension. She did miss few doses of Eliquis  per daughter.  Heparin  drip infusion per pharmacy protocol. PT OT suggested rehab consult.  Paroxysmal A-flutter- Patient is not compliant with Eliquis . Neurology advised heparin  drip per pharmacy protocol.  Essential hypertension- Cardizem  on hold to allow permissive hypertension.  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 She is at risk for strokes due to her cancer history.  Type 2 diabetes mellitus: A1c 6 Hold oral hypoglycemics. Continue Accu-Cheks, sliding scale insulin .  SLE-on Plaquenil .  Chronic anemia-stable.  Chronic hyponatremia-stable.  Trend sodium.  Hypothyroidism-continue home dose Synthroid     Out of bed  to chair. Incentive spirometry. Nursing supportive care. Fall, aspiration precautions. Diet:  Diet Orders (From admission, onward)     Start     Ordered   11/06/23 1843  Diet heart healthy/carb modified Room service appropriate? Yes; Fluid consistency: Thin  Diet effective now       Question Answer Comment  Room service appropriate? Yes   Fluid consistency: Thin      11/06/23 1842           DVT prophylaxis:   Level of care: Telemetry Medical   Code Status: Full Code  Subjective: Patient is seen and examined today morning.  She has right-sided weakness.  Daughter and family at bedside.  Physical Exam: Vitals:   11/07/23 0800 11/07/23 0921 11/07/23 1215 11/07/23 1310  BP: (!) 147/77  (!) 177/89   Pulse: 86  92   Resp: (!) 24  18   Temp:  98.4 F (36.9 C)  98.8 F (37.1 C)  TempSrc:  Oral  Oral  SpO2: 99%  100%   Weight:      Height:        General - Elderly thin built African-American female, no apparent distress HEENT - PERRLA, EOMI, atraumatic head, non tender sinuses. Lung - Clear, no rales, rhonchi, wheezes. Heart - S1, S2 heard, no murmurs, rubs, no pedal edema. Abdomen - Soft, non tender, bowel sounds good Neuro - Alert, awake and oriented, right-sided weakness Skin - Warm and dry.  Data Reviewed:      Latest Ref Rng & Units 11/07/2023    6:53 AM 11/06/2023   12:58 PM 11/02/2023    5:17 AM  CBC  WBC 4.0 - 10.5 K/uL 4.8  4.8  6.2   Hemoglobin 12.0 - 15.0 g/dL 9.9  89.2  9.9   Hematocrit 36.0 - 46.0 %  30.0  33.4  30.4   Platelets 150 - 400 K/uL 117  123  128       Latest Ref Rng & Units 11/06/2023   12:58 PM 11/02/2023    5:17 AM 11/01/2023    8:05 AM  BMP  Glucose 70 - 99 mg/dL 95  96  94   BUN 8 - 23 mg/dL 10  13  15    Creatinine 0.44 - 1.00 mg/dL 8.98  9.07  9.08   Sodium 135 - 145 mmol/L 133  129  132   Potassium 3.5 - 5.1 mmol/L 3.8  4.1  3.8   Chloride 98 - 111 mmol/L 99  99  98   CO2 22 - 32 mmol/L 24  22  22    Calcium  8.9 - 10.3 mg/dL  9.4  9.0  9.0    ECHOCARDIOGRAM COMPLETE Result Date: 11/07/2023    ECHOCARDIOGRAM REPORT   Patient Name:   [name] Date of Exam: 11/07/2023 Medical Rec #:  [id number]      Height:       67.0 in Accession #:    [id number]     Weight:       120.0 lb Date of Birth:  Apr 07, 1940       BSA:          1.628 m Patient Age:    83 years       BP:           156/66 mmHg Patient Gender: F              HR:           88 bpm. Exam Location:  Inpatient Procedure: 2D Echo, Cardiac Doppler and Color Doppler (Both Spectral and Color            Flow Doppler were utilized during procedure). Indications:    Stroke I63.9  History:        Patient has prior history of Echocardiogram examinations, most                 recent 11/01/2023. CHF, Previous Myocardial Infarction and CAD,                 Stroke and CKD, stage 3, Arrythmias:Atrial Flutter,                 Signs/Symptoms:Dyspnea, Shortness of Breath and Syncope; Risk                 Factors:Hypertension and Diabetes.  Sonographer:    Thea Norlander RCS Referring Phys: MAXIMINO DELENA SHARPS  Sonographer Comments: Image acquisition challenging due to patient body habitus. IMPRESSIONS  1. Left ventricular ejection fraction, by estimation, is 60 to 65%. The left ventricle has normal function. The left ventricle has no regional wall motion abnormalities. There is moderate asymmetric left ventricular hypertrophy of the septal and basal segments. Left ventricular diastolic parameters are indeterminate.  2. Right ventricular systolic function is normal. The right ventricular size is normal. There is mildly elevated pulmonary artery systolic pressure. The estimated right ventricular systolic pressure is 36.9 mmHg.  3. The mitral valve is degenerative. There is nodular thickening at the leatlet tips and loose chordal structure as well. Mild mitral valve regurgitation.  4. The aortic valve is tricuspid. Aortic valve regurgitation is moderate.  5. The inferior vena cava is normal in size with  greater than 50% respiratory variability, suggesting right atrial pressure of 3 mmHg. Comparison(s): Prior images reviewed side by side.  LVEF 60-65%. Mild mitral regurgitation with degenerative valve. Aortic regurgitation is now moderate - no obvious vegetation. FINDINGS  Left Ventricle: Left ventricular ejection fraction, by estimation, is 60 to 65%. The left ventricle has normal function. The left ventricle has no regional wall motion abnormalities. The left ventricular internal cavity size was normal in size. There is  moderate asymmetric left ventricular hypertrophy of the septal and basal segments. Left ventricular diastolic parameters are indeterminate. Right Ventricle: The right ventricular size is normal. No increase in right ventricular wall thickness. Right ventricular systolic function is normal. There is mildly elevated pulmonary artery systolic pressure. The tricuspid regurgitant velocity is 2.91  m/s, and with an assumed right atrial pressure of 3 mmHg, the estimated right ventricular systolic pressure is 36.9 mmHg. Left Atrium: Left atrial size was normal in size. Right Atrium: Right atrial size was normal in size. Pericardium: There is no evidence of pericardial effusion. Presence of epicardial fat layer. Mitral Valve: The mitral valve is degenerative in appearance. Mild mitral valve regurgitation. Tricuspid Valve: The tricuspid valve is grossly normal. Tricuspid valve regurgitation is mild. Aortic Valve: The aortic valve is tricuspid. There is mild aortic valve annular calcification. Aortic valve regurgitation is moderate. Aortic valve peak gradient measures 6.7 mmHg. Pulmonic Valve: The pulmonic valve was grossly normal. Pulmonic valve regurgitation is mild. Aorta: The aortic root and ascending aorta are structurally normal, with no evidence of dilitation. Venous: The inferior vena cava is normal in size with greater than 50% respiratory variability, suggesting right atrial pressure of 3 mmHg.  IAS/Shunts: No atrial level shunt detected by color flow Doppler. Additional Comments: 3D was performed not requiring image post processing on an independent workstation and was indeterminate.  LEFT VENTRICLE PLAX 2D LVIDd:         3.50 cm   Diastology LVIDs:         2.00 cm   LV e' medial:    4.35 cm/s LV PW:         0.90 cm   LV E/e' medial:  17.8 LV IVS:        0.90 cm   LV e' lateral:   9.25 cm/s LVOT diam:     2.20 cm   LV E/e' lateral: 8.4 LV SV:         60 LV SV Index:   37 LVOT Area:     3.80 cm  RIGHT VENTRICLE             IVC RV S prime:     17.90 cm/s  IVC diam: 1.00 cm TAPSE (M-mode): 1.4 cm LEFT ATRIUM             Index       RIGHT ATRIUM          Index LA diam:        2.30 cm 1.41 cm/m  RA Area:     5.05 cm LA Vol (A2C):   15.9 ml 9.77 ml/m  RA Volume:   4.44 ml  2.73 ml/m LA Vol (A4C):   8.8 ml  5.42 ml/m LA Biplane Vol: 11.0 ml 6.76 ml/m  AORTIC VALVE AV Area (Vmax): 2.54 cm AV Vmax:        129.00 cm/s AV Peak Grad:   6.7 mmHg LVOT Vmax:      86.20 cm/s LVOT Vmean:     57.500 cm/s LVOT VTI:       0.159 m  AORTA Ao Root diam: 3.10 cm Ao Asc diam:  3.20 cm MITRAL VALVE                TRICUSPID VALVE MV Area (PHT): 3.30 cm     TR Peak grad:   33.9 mmHg MV Decel Time: 230 msec     TR Vmax:        291.00 cm/s MV E velocity: 77.50 cm/s MV A velocity: 127.00 cm/s  SHUNTS MV E/A ratio:  0.61         Systemic VTI:  0.16 m                             Systemic Diam: 2.20 cm Jayson Sierras MD Electronically signed by Jayson Sierras MD Signature Date/Time: 11/07/2023/12:29:19 PM    Final    MR BRAIN WO CONTRAST Result Date: 11/06/2023 EXAM: MR Brain without Intravenous Contrast. CLINICAL HISTORY: Stroke, follow up. Episodes of slurred speech yesterday. New onset right-sided weakness today. TECHNIQUE: Magnetic resonance images of the brain without intravenous contrast in multiple planes. CONTRAST: Without. COMPARISON: None provided. FINDINGS: BRAIN: No intracranial mass or hemorrhage. No midline  shift or extra-axial fluid collection. No cerebellar tonsillar ectopia. The central arterial and venous flow voids are patent. Diffuse scattered punctate foci of restricted diffusion present throughout a watershed distribution bilaterally involving multiple small cortical infarcts over the convexities. Small acute infarcts are present in the inferior right frontal lobe and right caudate head. Multiple cortical infarcts are present along the posterior parietal lobes and occipital lobes bilaterally. A single right lateral pontine infarct is present. Posterior cerebellar infarcts are present bilaterally as well. T2 and FLAIR hyperintensities. Additional confluent periventricular T2 hyperintensities are present bilaterally. VENTRICLES: No hydrocephalus. ORBITS: The orbits are normal. SINUSES AND MASTOIDS: The sinuses and mastoid air cells are clear. BONES: No acute fracture or focal osseous lesion. IMPRESSION: 1. Multiple acute infarcts in a bilateral watershed distribution involving the convexities, inferior right frontal lobe, right caudate head, posterior parietal and occipital lobes, right lateral pons, and posterior cerebellum. Findings suggest a central embolic source 2. Confluent periventricular T2/FLAIR hyperintensities bilaterally, suggestive of chronic microvascular ischemic change. Electronically signed by: Lonni Necessary MD 11/06/2023 06:35 PM EDT RP Workstation: [id number]   MR ANGIO HEAD WO CONTRAST Result Date: 11/06/2023 EXAM: MR Angiography Head without intravenous Contrast. 11/06/2023 05:53:00 PM TECHNIQUE: Magnetic resonance angiography images of the head without intravenous contrast. Multiplanar 2D and 3D reformatted images are provided for review. COMPARISON: None provided. CLINICAL HISTORY: Neuro deficit, acute, stroke suspected. 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 Both A2 segments filled from the left. No significant stenosis of the internal carotid arteries.  No significant stenosis of the middle cerebral arteries. No aneurysm. POSTERIOR CIRCULATION: The left vertebral artery is a dominant vessel. No significant stenosis of the posterior cerebral arteries. No significant stenosis of the basilar artery. No aneurysm. IMPRESSION: 1. No significant intracranial arterial stenosis or vessel occlusion Electronically signed by: Lonni Necessary MD 11/06/2023 06:29 PM EDT RP Workstation: [id number]   MR ANGIO NECK WO CONTRAST Result Date: 11/06/2023 EXAM: MRA Neck without 11/06/2023 05:20:00 PM TECHNIQUE: Multiplanar multisequence MRA of the neck was performed without the administration of intravenous contrast. 2D and 3D reformatted images are provided for review. Stenosis of the internal carotid arteries measured using NASCET criteria. COMPARISON: None available CLINICAL HISTORY: Neuro deficit, acute, stroke suspected. FINDINGS: AORTIC ARCH AND ARCH VESSELS: No dissection or arterial injury. No significant stenosis of the brachiocephalic or subclavian arteries. CAROTID ARTERIES: No  dissection, arterial injury, or hemodynamically significant stenosis by NASCET criteria. No significant flow disturbance is demonstrated. VERTEBRAL ARTERIES: Flow is antegrade in the vertebral arteries bilaterally. No dissection, arterial injury, or significant stenosis. IMPRESSION: 1. No evidence of dissection, arterial injury, or hemodynamically significant stenosis in the aortic arch, arch vessels, carotid arteries, or vertebral arteries. Electronically signed by: Lonni Necessary MD 11/06/2023 06:16 PM EDT RP Workstation: [id number]   CT HEAD WO CONTRAST Result Date: 11/06/2023 EXAM: CT HEAD WITHOUT CONTRAST 11/06/2023 01:36:00 PM TECHNIQUE: CT of the head was performed without the administration of intravenous contrast. Automated exposure control, iterative reconstruction, and/or weight based adjustment of the mA/kV was utilized to reduce the radiation dose to as low as reasonably  achievable. COMPARISON: CT head without contrast 09/13/2023. CLINICAL HISTORY: Neuro deficit, acute, stroke suspected. Right lower extremity weakness. FINDINGS: BRAIN AND VENTRICLES: Her changes are stable and likely within normal limits for age. Atherosclerotic calcifications are present in the cavernous carotid arteries bilaterally and at the dural margin of both vertebral arteries. No hyperdense vessel is present. No acute hemorrhage. No evidence of acute infarct. No hydrocephalus. No extra-axial collection. No mass effect or midline shift. ORBITS: No acute abnormality. SINUSES: No acute abnormality. SOFT TISSUES AND SKULL: No acute soft tissue abnormality. No skull fracture. IMPRESSION: 1. No acute intracranial abnormality. Electronically signed by: Lonni Necessary MD 11/06/2023 01:50 PM EDT RP Workstation: [id number]    Family Communication: Discussed with patient, family at bedside.  They understand and agree. All questions answered.  Disposition: Status is: Inpatient Remains inpatient appropriate because: Further stroke workup including TEE  Planned Discharge Destination: Rehab     Time spent: 52 minutes  Author: Concepcion Riser, MD 11/07/2023 1:38 PM Secure chat 7am to 7pm For on call review [email or web address].

## 2023-11-08 ENCOUNTER — Ambulatory Visit: Payer: Self-pay | Admitting: Radiation Oncology

## 2023-11-08 DIAGNOSIS — Z9889 Other specified postprocedural states: Secondary | ICD-10-CM

## 2023-11-08 DIAGNOSIS — R29709 NIHSS score 9: Secondary | ICD-10-CM | POA: Diagnosis not present

## 2023-11-08 DIAGNOSIS — I6340 Cerebral infarction due to embolism of unspecified cerebral artery: Secondary | ICD-10-CM | POA: Diagnosis not present

## 2023-11-08 DIAGNOSIS — R531 Weakness: Secondary | ICD-10-CM | POA: Diagnosis not present

## 2023-11-08 DIAGNOSIS — D649 Anemia, unspecified: Secondary | ICD-10-CM | POA: Diagnosis not present

## 2023-11-08 DIAGNOSIS — I480 Paroxysmal atrial fibrillation: Secondary | ICD-10-CM | POA: Diagnosis not present

## 2023-11-08 DIAGNOSIS — I214 Non-ST elevation (NSTEMI) myocardial infarction: Secondary | ICD-10-CM

## 2023-11-08 DIAGNOSIS — I6310 Cerebral infarction due to embolism of unspecified precerebral artery: Secondary | ICD-10-CM | POA: Diagnosis not present

## 2023-11-08 DIAGNOSIS — I4892 Unspecified atrial flutter: Secondary | ICD-10-CM | POA: Diagnosis not present

## 2023-11-08 DIAGNOSIS — I10 Essential (primary) hypertension: Secondary | ICD-10-CM | POA: Diagnosis not present

## 2023-11-08 LAB — CBC
HCT: 29.5 % — ABNORMAL LOW (ref 36.0–46.0)
Hemoglobin: 9.8 g/dL — ABNORMAL LOW (ref 12.0–15.0)
MCH: 28.5 pg (ref 26.0–34.0)
MCHC: 33.2 g/dL (ref 30.0–36.0)
MCV: 85.8 fL (ref 80.0–100.0)
Platelets: 118 K/uL — ABNORMAL LOW (ref 150–400)
RBC: 3.44 MIL/uL — ABNORMAL LOW (ref 3.87–5.11)
RDW: 16.7 % — ABNORMAL HIGH (ref 11.5–15.5)
WBC: 4.8 K/uL (ref 4.0–10.5)
nRBC: 0 % (ref 0.0–0.2)

## 2023-11-08 LAB — APTT: aPTT: 79 s — ABNORMAL HIGH (ref 24–36)

## 2023-11-08 LAB — HEPARIN LEVEL (UNFRACTIONATED): Heparin Unfractionated: >1.1 [IU]/mL — ABNORMAL HIGH (ref 0.30–0.70)

## 2023-11-08 MED ORDER — DILTIAZEM HCL ER COATED BEADS 120 MG PO CP24
120.0000 mg | ORAL_CAPSULE | Freq: Every day | ORAL | Status: DC
  Administered 2023-11-08 – 2023-11-09 (×2): 120 mg via ORAL
  Filled 2023-11-08 (×2): qty 1

## 2023-11-08 MED ORDER — APIXABAN 2.5 MG PO TABS
2.5000 mg | ORAL_TABLET | Freq: Two times a day (BID) | ORAL | Status: DC
  Administered 2023-11-08 – 2023-11-09 (×2): 2.5 mg via ORAL
  Filled 2023-11-08 (×2): qty 1

## 2023-11-08 NOTE — Consult Note (Signed)
 Physical Medicine and Rehabilitation Consult Reason for Consult:impaired functional mobility Referring Physician: Darci   HPI: [name] is a 83 y.o. 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  CT of the head revealed large hypodensity on the right concerning for large territory infarct.  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  Stroke consistent with embolic.  Patient apparently missed Eliquis  doses.  Patient was placed on aspirin  and IV heparin  with transition to Eliquis  per neurology and cardiology recommendations.  Patient was seen by therapies yesterday and was max assist for sit to supine transfers and mod assist for sit to stand transfer.  She was able to stand for about 30 seconds with mod to max assistance.  Gait was not tested.  Patient was modified independent prior to arrival.  She was using a rolling walker since her heart attack a week ago.  She lives in a 1 level house with one-step to enter.  Family is at home.    Home: Home Living Family/patient expects to be discharged to:: Inpatient rehab Living Arrangements: Children Available Help at Discharge: Family, Available 24 hours/day Type of Home: House Home Access: Stairs to enter [employer] of Steps: 1 Entrance Stairs-Rails: Right, Left, Can reach both Home Layout: One level Bathroom Shower/Tub: Engineer, manufacturing systems: Standard Bathroom Accessibility: Yes Home Equipment: Cane - single point, Tub bench, BSC/3in1, Rollator (4 wheels) Additional Comments: enjoys shopping at [employer]   Functional History: Prior Function Prior Level of Function : Independent/Modified Independent Mobility Comments: Reports using RW since NSTEMI ~1 week ago ADLs Comments: Family drives, has medications pre-dosed in a packet Functional Status:  Mobility: Bed Mobility Overal bed mobility: Needs Assistance Bed Mobility: Supine to Sit, Sit to Supine Supine to sit: HOB elevated, Mod assist Sit to supine: Max assist General bed mobility comments: Pt sat up on L side of the stretcher with increased time and cues for seqeuencing. She was able to advance LLE slowly. Assist to move RLE and advance trunk. Scooted pt fwd with hips until feet flat. Returning to bed eased pt trunk down on L arm, brought BLE back into bed, rolled her onto back, and scooted her up in bed. Transfers Overall transfer level: Needs assistance Equipment used: None Transfers: Sit to/from Stand Sit to Stand: From elevated surface, Mod assist General transfer comment: Pt stood from raised stretcher. She held onto PT's upper arm with LUE. PT supported RUE and provided anterior knee block d/t weakness. Powered up with modA. Her RLE appeared to hyperextend. She stood statically for ~30secs with mod-maxA to maintain stability d/t posterior and right lateral lean.      ADL:    Cognition: Cognition Orientation Level: Oriented to person Cognition Arousal: Alert Behavior During Therapy: WFL for tasks assessed/performed   Review of Systems  HENT: Negative.    Eyes: Negative.   Respiratory: Negative.    Cardiovascular: Negative.   Gastrointestinal: Negative.   Genitourinary: Negative.   Musculoskeletal: Negative.   Skin: Negative.   Neurological:  Positive for focal weakness and weakness.  Psychiatric/Behavioral: Negative.     Past Medical History:  Diagnosis Date   Acute bronchitis 12/17/2021  Acute cystitis 02/20/2019   Arthritis    Atrial flutter (HCC)    Breast cancer (HCC)    left  2009 and 2025   Diabetes  mellitus without complication (HCC)    Dyspnea    mostly with exertion   GERD (gastroesophageal reflux disease)    History of radiation therapy    Left Lung-08/09/23-10/01/23- Dr. 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[facility])    Lupus    Pneumonia 07/31/2021   Past Surgical History:  Procedure Laterality Date   BREAST BIOPSY Left 01/15/2023   US  LT BREAST BX W LOC DEV 1ST LESION IMG BX SPEC US  GUIDE 01/15/2023 [facility]AMMOGRAPHY   BREAST LUMPECTOMY Left 2009   BRONCHIAL BIOPSY  06/29/2023   Procedure: BRONCHOSCOPY, WITH BIOPSY;  Surgeon: [name], MD;  Location: [facility] ENDOSCOPY;  Service: Pulmonary;;   BRONCHIAL BRUSHINGS  06/29/2023   Procedure: BRONCHOSCOPY, WITH BRUSH BIOPSY;  Surgeon: [name], MD;  Location: [facility] ENDOSCOPY;  Service: Pulmonary;;   BRONCHIAL NEEDLE ASPIRATION BIOPSY  06/29/2023   Procedure: BRONCHOSCOPY, WITH NEEDLE ASPIRATION BIOPSY;  Surgeon: [name], MD;  Location: [facility] ENDOSCOPY;  Service: Pulmonary;;   IR 3D INDEPENDENT WKST  05/29/2021   IR ANGIO INTRA EXTRACRAN SEL COM CAROTID INNOMINATE BILAT MOD SED  05/29/2021   IR ANGIO VERTEBRAL SEL VERTEBRAL BILAT MOD SED  05/29/2021   LEFT HEART CATH AND CORONARY ANGIOGRAPHY N/A 11/01/2023   Procedure: LEFT HEART CATH AND CORONARY ANGIOGRAPHY;  Surgeon: Wonda Sharper, MD;  Location: [facility] INVASIVE CV LAB;  Service: Cardiovascular;  Laterality: N/A;   SIMPLE MASTECTOMY WITH AXILLARY SENTINEL NODE BIOPSY Left 03/31/2023   Procedure: LEFT MASTECTOMY;  Surgeon: Vernetta Berg, MD;  Location: [facility] OR;  Service: General;  Laterality: Left;  LMA PEC BLOCK   VIDEO BRONCHOSCOPY WITH ENDOBRONCHIAL NAVIGATION Left 06/29/2023   Procedure: VIDEO BRONCHOSCOPY WITH ENDOBRONCHIAL NAVIGATION;  Surgeon: [name], MD;  Location: [facility] ENDOSCOPY;  Service: Pulmonary;  Laterality: Left;   VIDEO BRONCHOSCOPY WITH ENDOBRONCHIAL ULTRASOUND Bilateral 06/29/2023   Procedure: BRONCHOSCOPY, WITH EBUS;   Surgeon: [name], MD;  Location: [facility] ENDOSCOPY;  Service: Pulmonary;  Laterality: Bilateral;   Family History  Problem Relation Age of Onset   Cancer Father        prostate   Prostate cancer Father 53 - 21       metaststic   Asthma Brother    Cancer Daughter        breast   Breast cancer Daughter 87   Social History:  reports that she has never smoked. She has never been exposed to tobacco smoke. She does not have any smokeless tobacco history on file. She reports that she does not drink alcohol  and does not use drugs. Allergies:  Allergies  Allergen Reactions   Ciprofloxacin Itching, Swelling and Other (See Comments)    Possibly causing tremors?   Crestor  [Rosuvastatin ] Other (See Comments)    Myalgia and back pain   Lisinopril  Cough   Ofev  [Nintedanib] Nausea Only   Medications Prior to Admission  Medication Sig Dispense Refill   albuterol  (ACCUNEB ) 0.63 MG/3ML nebulizer solution Take 3 mLs (0.63 mg total) by nebulization every 6 (six) hours as needed for wheezing. 75 mL 12   albuterol  (VENTOLIN  HFA) 108 (90 Base) MCG/ACT inhaler Inhale 2 puffs into the lungs every 6 (six) hours as needed for wheezing or shortness of breath. 8.5 g 2   anastrozole  (ARIMIDEX ) 1  MG tablet Take 1 tablet (1 mg total) by mouth daily. 90 tablet 3   apixaban  (ELIQUIS ) 5 MG TABS tablet Take 1 tablet (5 mg total) by mouth 2 (two) times daily. Okay to restart on 06/30/2023     atorvastatin  (LIPITOR) 40 MG tablet Take 1 tablet (40 mg total) by mouth daily. 30 tablet 0   diclofenac  Sodium (VOLTAREN ) 1 % GEL Apply 1 application. topically 4 (four) times daily as needed (pain).     diltiazem  (CARDIZEM  CD) 120 MG 24 hr capsule Take 1 capsule (120 mg total) by mouth daily. 90 capsule 3   gabapentin  (NEURONTIN ) 300 MG capsule Take 300 mg by mouth 3 (three) times daily.     glimepiride  (AMARYL ) 1 MG tablet Take 1 mg by mouth daily with breakfast.     hydroxychloroquine  (PLAQUENIL ) 200 MG tablet Take 200 mg by  mouth daily.     Ipratropium-Albuterol  (COMBIVENT RESPIMAT) 20-100 MCG/ACT AERS respimat Inhale 1 puff into the lungs every 6 (six) hours as needed for shortness of breath.     levothyroxine  (SYNTHROID ) 100 MCG tablet Take 100 mcg by mouth daily.     omeprazole (PRILOSEC) 20 MG capsule Take 20 mg by mouth daily as needed (Indigestion).     spironolactone  (ALDACTONE ) 25 MG tablet Take 0.5 tablets (12.5 mg total) by mouth daily. 45 tablet 3   sucralfate  (CARAFATE ) 1 g tablet Take 1 g by mouth 4 (four) times daily.     aspirin  EC 81 MG tablet Take 1 tablet (81 mg total) by mouth daily. Swallow whole.     losartan -hydrochlorothiazide  (HYZAAR) 50-12.5 MG tablet Take 1 tablet by mouth daily. (Patient not taking: Reported on 10/31/2023)       Blood pressure 124/71, pulse 80, temperature 98.6 F (37 C), resp. rate 16, height 5' 7 (1.702 m), weight 54.4 kg, SpO2 100%. Physical Exam HENT:     Head: Normocephalic.     Right Ear: External ear normal.     Left Ear: External ear normal.     Nose: Nose normal.     Mouth/Throat:     Mouth: Mucous membranes are moist.  Eyes:     Conjunctiva/sclera: Conjunctivae normal.  Cardiovascular:     Rate and Rhythm: Normal rate.  Pulmonary:     Effort: Pulmonary effort is normal.  Abdominal:     Palpations: Abdomen is soft.  Musculoskeletal:        General: Normal range of motion.     Cervical back: Normal range of motion.  Skin:    General: Skin is warm.  Neurological:     Mental Status: She is alert.     Comments: Alert and oriented x 3. Fair insight and awareness. Intact Memory. Normal language and speech. Some delays in processng. Cranial nerve exam unremarkable. MMT: RUE 4-/5 prox to distal. RLE 4-/5 prox to 4/5 distally. LUE and LLE 4+ to 5/5. Sensory exam normal for light touch and pain in all 4 limbs. No limb ataxia or cerebellar signs. No abnormal tone appreciated.  [name]       Results for orders placed or performed during the hospital encounter of  11/06/23 (from the past 24 hours)  Glucose, capillary     Status: Abnormal   Collection Time: 11/07/23  3:33 PM  Result Value Ref Range   Glucose-Capillary 109 (H) 70 - 99 mg/dL  APTT     Status: Abnormal   Collection Time: 11/07/23  6:26 PM  Result Value Ref Range  aPTT 84 (H) 24 - 36 seconds  Heparin  level (unfractionated)     Status: Abnormal   Collection Time: 11/07/23  6:26 PM  Result Value Ref Range   Heparin  Unfractionated >1.10 (H) 0.30 - 0.70 IU/mL  CBC     Status: Abnormal   Collection Time: 11/08/23  2:06 AM  Result Value Ref Range   WBC 4.8 4.0 - 10.5 K/uL   RBC 3.44 (L) 3.87 - 5.11 MIL/uL   Hemoglobin 9.8 (L) 12.0 - 15.0 g/dL   HCT 70.4 (L) 63.9 - 53.9 %   MCV 85.8 80.0 - 100.0 fL   MCH 28.5 26.0 - 34.0 pg   MCHC 33.2 30.0 - 36.0 g/dL   RDW 83.2 (H) 88.4 - 84.4 %   Platelets 118 (L) 150 - 400 K/uL   nRBC 0.0 0.0 - 0.2 %  Heparin  level (unfractionated)     Status: Abnormal   Collection Time: 11/08/23  2:06 AM  Result Value Ref Range   Heparin  Unfractionated >1.10 (H) 0.30 - 0.70 IU/mL   ECHOCARDIOGRAM COMPLETE Result Date: 11/07/2023    ECHOCARDIOGRAM REPORT   Patient Name:   [name] Date of Exam: 11/07/2023 Medical Rec #:  [id number]      Height:       67.0 in Accession #:    [id number]     Weight:       120.0 lb Date of Birth:  09/23/1940       BSA:          1.628 m Patient Age:    83 years       BP:           156/66 mmHg Patient Gender: F              HR:           88 bpm. Exam Location:  Inpatient Procedure: 2D Echo, Cardiac Doppler and Color Doppler (Both Spectral and Color            Flow Doppler were utilized during procedure). Indications:    Stroke I63.9  History:        Patient has prior history of Echocardiogram examinations, most                 recent 11/01/2023. CHF, Previous Myocardial Infarction and CAD,                 Stroke and CKD, stage 3, Arrythmias:Atrial Flutter,                 Signs/Symptoms:Dyspnea, Shortness of Breath and Syncope; Risk                  Factors:Hypertension and Diabetes.  Sonographer:    Thea Norlander RCS Referring Phys: MAXIMINO DELENA SHARPS  Sonographer Comments: Image acquisition challenging due to patient body habitus. IMPRESSIONS  1. Left ventricular ejection fraction, by estimation, is 60 to 65%. The left ventricle has normal function. The left ventricle has no regional wall motion abnormalities. There is moderate asymmetric left ventricular hypertrophy of the septal and basal segments. Left ventricular diastolic parameters are indeterminate.  2. Right ventricular systolic function is normal. The right ventricular size is normal. There is mildly elevated pulmonary artery systolic pressure. The estimated right ventricular systolic pressure is 36.9 mmHg.  3. The mitral valve is degenerative. There is nodular thickening at the leatlet tips and loose chordal structure as well. Mild mitral valve regurgitation.  4. The aortic valve is tricuspid.  Aortic valve regurgitation is moderate.  5. The inferior vena cava is normal in size with greater than 50% respiratory variability, suggesting right atrial pressure of 3 mmHg. Comparison(s): Prior images reviewed side by side. LVEF 60-65%. Mild mitral regurgitation with degenerative valve. Aortic regurgitation is now moderate - no obvious vegetation. FINDINGS  Left Ventricle: Left ventricular ejection fraction, by estimation, is 60 to 65%. The left ventricle has normal function. The left ventricle has no regional wall motion abnormalities. The left ventricular internal cavity size was normal in size. There is  moderate asymmetric left ventricular hypertrophy of the septal and basal segments. Left ventricular diastolic parameters are indeterminate. Right Ventricle: The right ventricular size is normal. No increase in right ventricular wall thickness. Right ventricular systolic function is normal. There is mildly elevated pulmonary artery systolic pressure. The tricuspid regurgitant velocity is 2.91   m/s, and with an assumed right atrial pressure of 3 mmHg, the estimated right ventricular systolic pressure is 36.9 mmHg. Left Atrium: Left atrial size was normal in size. Right Atrium: Right atrial size was normal in size. Pericardium: There is no evidence of pericardial effusion. Presence of epicardial fat layer. Mitral Valve: The mitral valve is degenerative in appearance. Mild mitral valve regurgitation. Tricuspid Valve: The tricuspid valve is grossly normal. Tricuspid valve regurgitation is mild. Aortic Valve: The aortic valve is tricuspid. There is mild aortic valve annular calcification. Aortic valve regurgitation is moderate. Aortic valve peak gradient measures 6.7 mmHg. Pulmonic Valve: The pulmonic valve was grossly normal. Pulmonic valve regurgitation is mild. Aorta: The aortic root and ascending aorta are structurally normal, with no evidence of dilitation. Venous: The inferior vena cava is normal in size with greater than 50% respiratory variability, suggesting right atrial pressure of 3 mmHg. IAS/Shunts: No atrial level shunt detected by color flow Doppler. Additional Comments: 3D was performed not requiring image post processing on an independent workstation and was indeterminate.  LEFT VENTRICLE PLAX 2D LVIDd:         3.50 cm   Diastology LVIDs:         2.00 cm   LV e' medial:    4.35 cm/s LV PW:         0.90 cm   LV E/e' medial:  17.8 LV IVS:        0.90 cm   LV e' lateral:   9.25 cm/s LVOT diam:     2.20 cm   LV E/e' lateral: 8.4 LV SV:         60 LV SV Index:   37 LVOT Area:     3.80 cm  RIGHT VENTRICLE             IVC RV S prime:     17.90 cm/s  IVC diam: 1.00 cm TAPSE (M-mode): 1.4 cm LEFT ATRIUM             Index       RIGHT ATRIUM          Index LA diam:        2.30 cm 1.41 cm/m  RA Area:     5.05 cm LA Vol (A2C):   15.9 ml 9.77 ml/m  RA Volume:   4.44 ml  2.73 ml/m LA Vol (A4C):   8.8 ml  5.42 ml/m LA Biplane Vol: 11.0 ml 6.76 ml/m  AORTIC VALVE AV Area (Vmax): 2.54 cm AV Vmax:         129.00 cm/s AV Peak Grad:   6.7 mmHg LVOT Vmax:  86.20 cm/s LVOT Vmean:     57.500 cm/s LVOT VTI:       0.159 m  AORTA Ao Root diam: 3.10 cm Ao Asc diam:  3.20 cm MITRAL VALVE                TRICUSPID VALVE MV Area (PHT): 3.30 cm     TR Peak grad:   33.9 mmHg MV Decel Time: 230 msec     TR Vmax:        291.00 cm/s MV E velocity: 77.50 cm/s MV A velocity: 127.00 cm/s  SHUNTS MV E/A ratio:  0.61         Systemic VTI:  0.16 m                             Systemic Diam: 2.20 cm Jayson Sierras MD Electronically signed by Jayson Sierras MD Signature Date/Time: 11/07/2023/12:29:19 PM    Final    MR BRAIN WO CONTRAST Result Date: 11/06/2023 EXAM: MR Brain without Intravenous Contrast. CLINICAL HISTORY: Stroke, follow up. Episodes of slurred speech yesterday. New onset right-sided weakness today. TECHNIQUE: Magnetic resonance images of the brain without intravenous contrast in multiple planes. CONTRAST: Without. COMPARISON: None provided. FINDINGS: BRAIN: No intracranial mass or hemorrhage. No midline shift or extra-axial fluid collection. No cerebellar tonsillar ectopia. The central arterial and venous flow voids are patent. Diffuse scattered punctate foci of restricted diffusion present throughout a watershed distribution bilaterally involving multiple small cortical infarcts over the convexities. Small acute infarcts are present in the inferior right frontal lobe and right caudate head. Multiple cortical infarcts are present along the posterior parietal lobes and occipital lobes bilaterally. A single right lateral pontine infarct is present. Posterior cerebellar infarcts are present bilaterally as well. T2 and FLAIR hyperintensities. Additional confluent periventricular T2 hyperintensities are present bilaterally. VENTRICLES: No hydrocephalus. ORBITS: The orbits are normal. SINUSES AND MASTOIDS: The sinuses and mastoid air cells are clear. BONES: No acute fracture or focal osseous lesion. IMPRESSION: 1. Multiple  acute infarcts in a bilateral watershed distribution involving the convexities, inferior right frontal lobe, right caudate head, posterior parietal and occipital lobes, right lateral pons, and posterior cerebellum. Findings suggest a central embolic source 2. Confluent periventricular T2/FLAIR hyperintensities bilaterally, suggestive of chronic microvascular ischemic change. Electronically signed by: Lonni Necessary MD 11/06/2023 06:35 PM EDT RP Workstation: [id number]   MR ANGIO HEAD WO CONTRAST Result Date: 11/06/2023 EXAM: MR Angiography Head without intravenous Contrast. 11/06/2023 05:53:00 PM TECHNIQUE: Magnetic resonance angiography images of the head without intravenous contrast. Multiplanar 2D and 3D reformatted images are provided for review. COMPARISON: None provided. CLINICAL HISTORY: Neuro deficit, acute, stroke suspected. 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 Both A2 segments filled from the left. No significant stenosis of the internal carotid arteries. No significant stenosis of the middle cerebral arteries. No aneurysm. POSTERIOR CIRCULATION: The left vertebral artery is a dominant vessel. No significant stenosis of the posterior cerebral arteries. No significant stenosis of the basilar artery. No aneurysm. IMPRESSION: 1. No significant intracranial arterial stenosis or vessel occlusion Electronically signed by: Lonni Necessary MD 11/06/2023 06:29 PM EDT RP Workstation: [id number]   MR ANGIO NECK WO CONTRAST Result Date: 11/06/2023 EXAM: MRA Neck without 11/06/2023 05:20:00 PM TECHNIQUE: Multiplanar multisequence MRA of the neck was performed without the administration of intravenous contrast. 2D and 3D reformatted images are provided for review. Stenosis of the internal carotid arteries measured using NASCET criteria. COMPARISON: None available  CLINICAL HISTORY: Neuro deficit, acute, stroke suspected. FINDINGS: AORTIC ARCH AND ARCH VESSELS: No dissection  or arterial injury. No significant stenosis of the brachiocephalic or subclavian arteries. CAROTID ARTERIES: No dissection, arterial injury, or hemodynamically significant stenosis by NASCET criteria. No significant flow disturbance is demonstrated. VERTEBRAL ARTERIES: Flow is antegrade in the vertebral arteries bilaterally. No dissection, arterial injury, or significant stenosis. IMPRESSION: 1. No evidence of dissection, arterial injury, or hemodynamically significant stenosis in the aortic arch, arch vessels, carotid arteries, or vertebral arteries. Electronically signed by: Lonni Necessary MD 11/06/2023 06:16 PM EDT RP Workstation: [id number]   CT HEAD WO CONTRAST Result Date: 11/06/2023 EXAM: CT HEAD WITHOUT CONTRAST 11/06/2023 01:36:00 PM TECHNIQUE: CT of the head was performed without the administration of intravenous contrast. Automated exposure control, iterative reconstruction, and/or weight based adjustment of the mA/kV was utilized to reduce the radiation dose to as low as reasonably achievable. COMPARISON: CT head without contrast 09/13/2023. CLINICAL HISTORY: Neuro deficit, acute, stroke suspected. Right lower extremity weakness. FINDINGS: BRAIN AND VENTRICLES: Her changes are stable and likely within normal limits for age. Atherosclerotic calcifications are present in the cavernous carotid arteries bilaterally and at the dural margin of both vertebral arteries. No hyperdense vessel is present. No acute hemorrhage. No evidence of acute infarct. No hydrocephalus. No extra-axial collection. No mass effect or midline shift. ORBITS: No acute abnormality. SINUSES: No acute abnormality. SOFT TISSUES AND SKULL: No acute soft tissue abnormality. No skull fracture. IMPRESSION: 1. No acute intracranial abnormality. 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 Yes Does the patient require coordinated care of a physician, rehab nurse, therapy disciplines of PT, OT, SLP to address physical and functional deficits in the context of the above medical diagnosis(es)? Yes Addressing deficits in the following areas: balance, endurance, locomotion, strength, transferring, bowel/bladder control, bathing, dressing, feeding, grooming, toileting, cognition, speech, and psychosocial support Can the patient actively participate in an intensive therapy program of at least 3 hrs of therapy per day at least 5 days per week? Yes The potential for patient to make measurable gains while on inpatient rehab is excellent Anticipated functional outcomes upon discharge from inpatient rehab are modified independent and supervision  with PT, modified independent and supervision with OT, modified independent and supervision with SLP. Estimated rehab length of stay to reach the above functional goals is: 12-15 days Anticipated discharge destination: Home Overall Rehab/Functional Prognosis: excellent  POST ACUTE RECOMMENDATIONS: This patient's condition is appropriate for continued rehabilitative care in the following setting: CIR Patient has agreed to participate in recommended program. Yes Note that insurance prior authorization may be  required for reimbursement for recommended care.  Comment: Pt states that daughter is home with her. Rehab Admissions Coordinator to follow up.       I have personally performed a face to face diagnostic evaluation of this patient. Additionally, I have  examined the patient's medical record including any pertinent labs and radiographic images.    Thanks,  Arthea ONEIDA Gunther, MD 11/08/2023

## 2023-11-08 NOTE — PMR Pre-admission (Signed)
 PMR Admission Coordinator Pre-Admission Assessment  Patient: [name] is an 83 y.o., female MRN: [id number] DOB: 1940-08-26 Height: 5' 7 (170.2 cm) Weight: 54.4 kg              Insurance Information HMO: yes    PPO:      PCP:      IPA:      80/20:      OTHER:  PRIMARY: [employer] Dual Complete     Policy#: [id number], Medicare [id number]      Subscriber: patient CM Name: Powell      Phone#: [phone] option 3     Fax#: [phone] Pre-Cert#: [id number]  auth for CIR from [facility] with [facility] medicare for admit 10/13 with next review date 10/20.  Updates due to general fax at fax listed above.     Benefits:  Phone #: [email or web address]      Eff. Date: 09/27/2023-still active     Deduct: $257 ($257 met)      Out of Pocket Max: $9,350 [phone] met)        CIR: $1,565 copay/admission      SNF: 20 full days Outpatient: 80% coverage      Home Health: 100% coverage       DME: 80% coverage      Providers: patient choice SECONDARY: Medicaid of [place]      Policy#: [id number]      Phone#: [phone]  Financial Counselor:       Phone#:   The "Data Collection Information Summary" for patients in Inpatient Rehabilitation Facilities with attached "Privacy Act Statement-Health Care Records" was provided and verbally reviewed with: Patient and Family  Emergency Contact Information Contact Information     Name Relation Home Work Mobile   [place] Daughter [phone]  [phone]      Other Contacts   None on File    Current Medical History  Patient Admitting Diagnosis: CVA  History of Present Illness: [name] is a 83 y.o. 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    CT of the head revealed large hypodensity on the right concerning for large  territory infarct.  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  Stroke consistent with embolic.  Patient apparently missed Eliquis  doses. Echo shows EF 60%, increased RVSP at 36.9 mmHg, mild MR, moderate AR. Continue aspirin , statin.  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 S/p radiation, on anastrozole .She is at risk for strokes due to her cancer history. Therapies are recommending intensive rehab.   Complete NIHSS TOTAL: 9 Glasgow Coma Scale Score: 15  [facility]'s medical record from [facility] has been reviewed by the rehabilitation admission coordinator and physician.  Past Medical History  Past Medical History:  Diagnosis Date   Acute bronchitis 12/17/2021   Acute cystitis 02/20/2019   Arthritis    Atrial flutter (HCC)    Breast cancer (HCC)    left  2009 and 2025   Diabetes mellitus without complication (HCC)    Dyspnea    mostly with exertion   GERD (gastroesophageal reflux disease)    History of radiation therapy    Left Lung-08/09/23-10/01/23- Dr. 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 No  Family History  family history includes Asthma in her brother; Breast cancer (age of onset: 51) in her daughter; Cancer in her daughter and father; Prostate cancer (age of onset: 55 - 71) in her father.  Current Medications   Current Facility-Administered Medications:    acetaminophen  (TYLENOL ) tablet 650 mg, 650 mg, Oral, Q4H PRN, 650 mg at 11/09/23 0852 **OR** acetaminophen  (TYLENOL ) 160 MG/5ML solution 650 mg, 650 mg, Per Tube, Q4H PRN **OR** acetaminophen  (TYLENOL ) suppository 650 mg, 650 mg, Rectal, Q4H  PRN, Claudene, Rondell A, MD   albuterol  (PROVENTIL ) (2.5 MG/3ML) 0.083% nebulizer solution 2.5 mg, 2.5 mg, Nebulization, Q6H PRN, Claudene, Rondell A, MD   anastrozole  (ARIMIDEX ) tablet 1 mg, 1 mg, Oral, Daily, Smith, Rondell A, MD, 1 mg at 11/09/23 9146   apixaban  (ELIQUIS ) tablet 2.5 mg, 2.5 mg, Oral, BID, Williamson, Erin R, RPH, 2.5 mg at 11/09/23 9146   aspirin  EC tablet 81 mg, 81 mg, Oral, Daily, Smith, Rondell A, MD, 81 mg at 11/09/23 0853   atorvastatin  (LIPITOR) tablet 40 mg, 40 mg, Oral, Daily, Smith, Rondell A, MD, 40 mg at 11/09/23 9146   diltiazem  (CARDIZEM  CD) 24 hr capsule 120 mg, 120 mg, Oral, Daily, Sreeram, Narendranath, MD, 120 mg at 11/09/23 0853   feeding supplement (ENSURE PLUS HIGH PROTEIN) liquid 237 mL, 237 mL, Oral, BID BM, Sreeram, Narendranath, MD, 237 mL at 11/09/23 0854   gabapentin  (NEURONTIN ) capsule 300 mg, 300 mg, Oral, TID, Claudene, Rondell A, MD, 300 mg at 11/09/23 9146   hydrALAZINE  (APRESOLINE ) injection 10 mg, 10 mg, Intravenous, Q4H PRN, Claudene, Rondell A, MD   hydroxychloroquine  (PLAQUENIL ) tablet 200 mg, 200 mg, Oral, Daily, Smith, Rondell A, MD, 200 mg at 11/09/23 9146   levothyroxine  (SYNTHROID ) tablet 100 mcg, 100 mcg, Oral, Daily, Claudene, Rondell A, MD, 100 mcg at 11/09/23 0629   senna-docusate (Senokot-S) tablet 1 tablet, 1 tablet, Oral, QHS PRN, Claudene Maximino LABOR, MD  Patients Current Diet:  Diet Order             Diet heart healthy/carb modified Room service appropriate? Yes; Fluid consistency: Thin  Diet effective now                  Precautions / Restrictions Precautions Precautions: Fall Precaution/Restrictions Comments: SBP <180 Restrictions Weight Bearing Restrictions Per Provider Order: No   Has the patient had 2 or more falls or a fall with injury in the past year?No  Prior Activity Level Limited Community (1-2x/wk): used RW over the past week  Prior Functional Level Prior Function Prior Level of Function : Independent/Modified  Independent Mobility Comments: Reports using RW since NSTEMI ~1 week ago ADLs Comments: Family drives, has medications pre-dosed in a packet  Self Care: Did the patient need help bathing, dressing, using the toilet or eating?  Independent  Indoor Mobility: Did the patient need assistance with walking from room to room (with or without device)? Independent  Stairs: Did the patient need assistance with internal or external stairs (with or without device)? Independent  Functional Cognition: Did the patient need help planning regular tasks such as shopping or remembering to take medications? Needed some help  Patient Information Are you of Hispanic, Latino/a,or Spanish origin?: A. No, not of Hispanic, Latino/a, or Spanish origin What is your race?: B. Black or African  American Do you need or want an interpreter to communicate with a doctor or health care staff?: 0. No  Patient's Response To:  Health Literacy and Transportation Is the patient able to respond to health literacy and transportation needs?: Yes Health Literacy - How often do you need to have someone help you when you read instructions, pamphlets, or other written material from your doctor or pharmacy?: Never In the past 12 months, has lack of transportation kept you from medical appointments or from getting medications?: No In the past 12 months, has lack of transportation kept you from meetings, work, or from getting things needed for daily living?: No  Journalist, newspaper / Equipment Home Equipment: Cane - single point, Tub bench, BSC/3in1, Rollator (4 wheels)  Prior Device Use: Indicate devices/aids used by the patient prior to current illness, exacerbation or injury? Walker  Current Functional Level Cognition  Orientation Level: Oriented to person    Extremity Assessment (includes Sensation/Coordination)  Upper Extremity Assessment: Right hand dominant, RUE deficits/detail RUE Deficits / Details: shoulder flexion  ~1/5, elbow flexion/extension ~0/5, grip strength ~2/5; intact sensation per pt report RUE Sensation: WNL RUE Coordination: decreased fine motor, decreased gross motor  Lower Extremity Assessment: Defer to PT evaluation RLE Deficits / Details: Pt with decreased AROM likely secondary to weakness. Performed PROM, WFL, able to achieve appropraite end feels. Pt reported decreased sensation. She was unable to complete heel-to-shin and has significantly slowler rapid alternating movements when attempting to tap feet. RLE Sensation: decreased light touch, decreased proprioception RLE Coordination: decreased gross motor    ADLs  Overall ADL's : Needs assistance/impaired Eating/Feeding: Minimal assistance, Bed level Eating/Feeding Details (indicate cue type and reason): using L hand (non-dominant) to feed self, needs A for container/beverage mgmt Upper Body Dressing : Maximal assistance, Sitting Upper Body Dressing Details (indicate cue type and reason): adjusting gown Lower Body Dressing: Total assistance, Bed level Lower Body Dressing Details (indicate cue type and reason): donning B socks Toileting- Clothing Manipulation and Hygiene: Total assistance, Bed level Toileting - Clothing Manipulation Details (indicate cue type and reason): purewick    Mobility  Overal bed mobility: Needs Assistance Bed Mobility: Supine to Sit, Sit to Supine Supine to sit: Mod assist, HOB elevated, Used rails Sit to supine: Max assist, HOB elevated General bed mobility comments: Exited to the R side, cues for RUE mgmt and assist to bring RLE off EOB. Inc A for sit>supine 2/2 fatigue.    Transfers  Overall transfer level: Needs assistance Equipment used: None Transfers: Sit to/from Stand Sit to Stand: From elevated surface, Mod assist General transfer comment: did not assess due to reduced static sitting balance at EOB; anticipate inc A needed 2/2 RHB paresis.    Ambulation / Gait / Stairs / Environmental manager / Balance Dynamic Sitting Balance Sitting balance - Comments: seated EOB, needing mod A-bouts of CGA to sustain midline. Cued for WB through R hand on lap while seated EOB. Balance Overall balance assessment: Needs assistance Sitting-balance support: Single extremity supported, Feet supported Sitting balance-Leahy Scale: Poor Sitting balance - Comments: seated EOB, needing mod A-bouts of CGA to sustain midline. Cued for WB through R hand on lap while seated EOB. Postural control: Right lateral lean, Posterior lean Standing balance support: Single extremity supported, During functional activity Standing balance-Leahy Scale: Poor Standing balance comment: Pt dependent on external support of therapist and mod-maxA to maintain brief static stance.    Special considerations/ Life events  Previous Home Environment  Living Arrangements: Children  Lives With: Family Available Help at Discharge: Family, Available 24 hours/day Type of Home: House Home Layout: One level Home Access: Stairs to enter Entrance Stairs-Rails: Right, Left, Can reach both Entrance Stairs-Number of Steps: 1 Bathroom Shower/Tub: Armed forces operational officer Accessibility: Yes How Accessible: Accessible via walker Home Care Services: No Additional Comments: enjoys shopping at [employer]  Discharge Living Setting Plans for Discharge Living Setting: Patient's home, Lives with (comment) (daughter) Type of Home at Discharge: House Discharge Home Layout: One level Discharge Home Access: Stairs to enter Entrance Stairs-Rails: Right, Can reach both, Left Entrance Stairs-Number of Steps: 1 Discharge Bathroom Shower/Tub: Tub/shower unit Discharge Bathroom Toilet: Standard Discharge Bathroom Accessibility: Yes How Accessible: Accessible via walker Does the patient have any problems obtaining your medications?: No  Social/Family/Support Systems Anticipated Caregiver: [name],  daughter Anticipated Caregiver's Contact Information: [phone] Caregiver Availability: 24/7 Discharge Plan Discussed with Primary Caregiver: Yes Is Caregiver In Agreement with Plan?: Yes Does Caregiver/Family have Issues with Lodging/Transportation while Pt is in Rehab?: No   Goals Patient/Family Goal for Rehab: Mod I to superivsion with PT, OT and SLP Expected length of stay: 12-15 days Pt/Family Agrees to Admission and willing to participate: Yes Program Orientation Provided & Reviewed with Pt/Caregiver Including Roles  & Responsibilities: Yes  Barriers to Discharge: Insurance for SNF coverage   Decrease burden of Care through IP rehab admission: n/a   Possible need for SNF placement upon discharge:not anticipated   Patient Condition: This patient's medical and functional status has not changed since the consult dated 11/08/23 in which the Rehabilitation Physician determined and documented that the patient was appropriate for intensive rehabilitative care in an inpatient rehabilitation facility. Case has been discussed with Dr. Tomeca Helm and patient is appropriate for inpatient rehabilitation. Will admit to inpatient rehab today.   Preadmission Screen Completed By:  Kristyn Conetta, 11/09/2023 10:18 AM with updates by Heron Leavell RN MSN on 11/09/23 at 1023 ______________________________________________________________________   Discussed status with Dr. Cornelio on 11/09/23 at 1023 and received approval for admission today.  Admission Coordinator:  Kristyn Conetta, time 1023 Date 11/09/23   Assessment/Plan: Diagnosis: B/L cerebellar infarcts and watershed strokes Does the need for close, 24 hr/day Medical supervision in concert with the patient's rehab needs make it unreasonable for this patient to be served in a less intensive setting? Yes Co-Morbidities requiring supervision/potential complications: DM, Stage 3A lung CA on radiation, s/p breast cancer 06/2023- s/p mastectomy;  HTN, HLD, CAD s/p recent cath for NSTEMI  Due to bladder management, bowel management, safety, skin/wound care, disease management, medication administration, pain management, and patient education, does the patient require 24 hr/day rehab nursing? Yes Does the patient require coordinated care of a physician, rehab nurse, PT, OT, and SLP to address physical and functional deficits in the context of the above medical diagnosis(es)? Yes Addressing deficits in the following areas: balance, endurance, locomotion, strength, transferring, bowel/bladder control, bathing, dressing, feeding, grooming, toileting, cognition, speech, and swallowing Can the patient actively participate in an intensive therapy program of at least 3 hrs of therapy 5 days a week? Yes The potential for patient to make measurable gains while on inpatient rehab is good Anticipated functional outcomes upon discharge from inpatient rehab: modified independent and supervision PT, modified independent and supervision OT, modified independent and supervision SLP Estimated rehab length of stay to reach the above functional goals is: 12-15 days Anticipated discharge destination: Home 10. Overall Rehab/Functional Prognosis: good   MD Signature:

## 2023-11-08 NOTE — Progress Notes (Signed)
 [name]  Patient Care Team: [name], MD as PCP - General (Family Medicine) Ladona Heinz, MD as PCP - Cardiology (Cardiology) Cindie Ole DASEN, MD as PCP - Electrophysiology (Cardiology) Ishmael Slough, MD as Consulting Physician (Rheumatology) Tyree Nanetta SAILOR, RN as Oncology Nurse Navigator Loretha Ash, MD as Consulting Physician (Hematology and Oncology) [name], MD as Consulting Physician (Radiation Oncology)  CHIEF COMPLAINTS/PURPOSE OF CONSULTATION:  Newly diagnosed breast cancer  HISTORY OF PRESENTING ILLNESS:  [name] 83 y.o. female is here because of recent diagnosis of left breast IDC  I reviewed her records extensively and collaborated the history with the patient.  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 1.1 cm probable cystic appearing mass lies slightly above and lateral to the lumpectomy bed calcification. Aspiration to confirm this as a benign cyst is recommended.   01/08/2023 Breast US    Ultrasound-guided cyst aspiration of the 1.1 cm presumed complicated cyst in the left breast at 10 o'clock, 2 cm the nipple. If this does not aspirate, ultrasound-guided core needle biopsy would be indicated.     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 VUS identified in BRCA2 p.E1605K (c.4813G>A) and ALK p.T656I (c.1967C>T). The CancerNext-Expanded gene panel offered by [facility] and includes sequencing, rearrangement, and RNA analysis for the following 76 genes: AIP, ALK, APC, ATM, AXIN2, BAP1, BARD1, BMPR1A, BRCA1, BRCA2, BRIP1, CDC73, CDH1, CDK4, CDKN1B, CDKN2A, CEBPA, CHEK2, CTNNA1, DDX41, DICER1, ETV6, FH, FLCN, GATA2, LZTR1, MAX, MBD4, MEN1, MET, MLH1, MSH2, MSH3, MSH6, MUTYH, NF1, NF2, NTHL1, PALB2, PHOX2B, PMS2, POT1, PRKAR1A, PTCH1, PTEN, RAD51C, RAD51D, RB1, RET, RUNX1, SDHA, SDHAF2, SDHB, SDHC, SDHD, SMAD4, SMARCA4, SMARCB1, SMARCE1, STK11, SUFU, TMEM127, TP53, TSC1, TSC2, VHL, and WT1 (sequencing and deletion/duplication); EGFR, HOXB13, KIT, MITF, PDGFRA, POLD1, and POLE (sequencing only); EPCAM and GREM1 (deletion/duplication only). Report date 03/08/23.    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)   07/22/2023 Cancer Staging   Staging form: Lung, AJCC V9 - Clinical: Stage IIIB (cT2, cN2b(f), cM0) - Signed by Loretha Ash, MD on 07/22/2023 Method of lymph node assessment: Fine needle aspiration   08/09/2023 - 10/01/2023 Radiation Therapy   Plan Name: Lung_L Site: Lung, Left Technique: 3D Mode: Photon Dose Per Fraction: 2 Gy Prescribed Dose (Delivered / Prescribed): 60 Gy / 60 Gy Prescribed Fxs (Delivered / Prescribed): 30 / 30   Plan Name: Lung_L_Bst Site: Lung, Left Technique: 3D Mode: Photon Dose Per Fraction: 2 Gy Prescribed Dose (Delivered / Prescribed): 6 Gy / 6 Gy Prescribed Fxs (Delivered / Prescribed): 3 / 3     History of  Present Illness    Discussed the use of AI scribe software for clinical note transcription with the patient, who  gave verbal consent to proceed. History of Present Illness  [name] is an 83 year old female with lung and breast cancer who was recently discharged after having a NSTEMI now admitted with right sided weakness and slurred speech. She has noticed the symptoms a day before presentation to the hospital. She was recently hospitalized for chest pain and was thought to have NSTEMI. 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  She admits that she hasn't been taking her eliquis  regularly. She says her most bothersome complaint is weakness on the right side and her memory is affected. She doesn't remember me, she says my voice sounds familiar but that's pretty much it.  MEDICAL HISTORY:  Past Medical History:  Diagnosis Date   Acute bronchitis 12/17/2021   Acute cystitis 02/20/2019   Arthritis    Atrial flutter (HCC)    Breast cancer (HCC)    left  2009 and 2025   Diabetes mellitus without complication (HCC)    Dyspnea    mostly with exertion   GERD (gastroesophageal reflux disease)    History of radiation therapy    Left Lung-08/09/23-10/01/23- Dr. 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[facility])    Lupus    Pneumonia 07/31/2021    SURGICAL HISTORY: Past Surgical History:  Procedure Laterality Date   BREAST BIOPSY Left 01/15/2023   US  LT BREAST BX W LOC DEV 1ST LESION IMG BX SPEC US  GUIDE 01/15/2023 [facility] MAMMOGRAPHY   BREAST LUMPECTOMY Left 2009   BRONCHIAL BIOPSY  06/29/2023   Procedure: BRONCHOSCOPY, WITH BIOPSY;  Surgeon: [name], MD;  Location: [facility] ENDOSCOPY;  Service: Pulmonary;;   BRONCHIAL BRUSHINGS  06/29/2023   Procedure: BRONCHOSCOPY, WITH BRUSH BIOPSY;  Surgeon: [name], MD;  Location: [facility] ENDOSCOPY;  Service:  Pulmonary;;   BRONCHIAL NEEDLE ASPIRATION BIOPSY  06/29/2023   Procedure: BRONCHOSCOPY, WITH NEEDLE ASPIRATION BIOPSY;  Surgeon: [name], MD;  Location: [facility] ENDOSCOPY;  Service: Pulmonary;;   IR 3D INDEPENDENT WKST  05/29/2021   IR ANGIO INTRA EXTRACRAN SEL COM CAROTID INNOMINATE BILAT MOD SED  05/29/2021   IR ANGIO VERTEBRAL SEL VERTEBRAL BILAT MOD SED  05/29/2021   LEFT HEART CATH AND CORONARY ANGIOGRAPHY N/A 11/01/2023   Procedure: LEFT HEART CATH AND CORONARY ANGIOGRAPHY;  Surgeon: Wonda Sharper, MD;  Location: [facility] INVASIVE CV LAB;  Service: Cardiovascular;  Laterality: N/A;   SIMPLE MASTECTOMY WITH AXILLARY SENTINEL NODE BIOPSY Left 03/31/2023   Procedure: LEFT MASTECTOMY;  Surgeon: Vernetta Berg, MD;  Location: [facility] OR;  Service: General;  Laterality: Left;  LMA PEC BLOCK   VIDEO BRONCHOSCOPY WITH ENDOBRONCHIAL NAVIGATION Left 06/29/2023   Procedure: VIDEO BRONCHOSCOPY WITH ENDOBRONCHIAL NAVIGATION;  Surgeon: [name], MD;  Location[facility] ENDOSCOPY;  Service: Pulmonary;  Laterality: Left;   VIDEO BRONCHOSCOPY WITH ENDOBRONCHIAL ULTRASOUND Bilateral 06/29/2023   Procedure: BRONCHOSCOPY, WITH EBUS;  Surgeon: [name], MD;  Location: [facility] ENDOSCOPY;  Service: Pulmonary;  Laterality: Bilateral;    SOCIAL HISTORY: Social History   Socioeconomic History   Marital status: Single    Spouse name: Not on file   Number of children: 2  Years of education: Not on file   Highest education level: Not on file  Occupational History   Not on file  Tobacco Use   Smoking status: Never    Passive exposure: Never   Smokeless tobacco: Not on file  Vaping Use   Vaping status: Never Used  Substance and Sexual Activity   Alcohol  use: No   Drug use: No   Sexual activity: Not Currently  Other Topics Concern   Not on file  Social History Narrative   Not on file   Social Drivers of Health   Financial Resource Strain: Not on file  Food Insecurity: Food Insecurity Present (11/07/2023)    Hunger Vital Sign    Worried About Running Out of Food in the Last Year: Sometimes true    Ran Out of Food in the Last Year: Sometimes true  Transportation Needs: No Transportation Needs (11/07/2023)   PRAPARE - Administrator, Civil Service (Medical): No    Lack of Transportation (Non-Medical): No  Physical Activity: Not on file  Stress: Not on file  Social Connections: Moderately Integrated (11/07/2023)   Social Connection and Isolation Panel    Frequency of Communication with Friends and Family: More than three times a week    Frequency of Social Gatherings with Friends and Family: Once a week    Attends Religious Services: More than 4 times per year    Active Member of [employer] or Organizations: Yes    Attends Banker Meetings: 1 to 4 times per year    Marital Status: Divorced  Catering manager Violence: Not At Risk (11/07/2023)   Humiliation, Afraid, Rape, and Kick questionnaire    Fear of Current or Ex-Partner: No    Emotionally Abused: No    Physically Abused: No    Sexually Abused: No    FAMILY HISTORY: Family History  Problem Relation Age of Onset   Cancer Father        prostate   Prostate cancer Father 2 - 55       metaststic   Asthma Brother    Cancer Daughter        breast   Breast cancer Daughter 42    ALLERGIES:  is allergic to ciprofloxacin, crestor  [rosuvastatin ], lisinopril , and ofev  [nintedanib].  MEDICATIONS:  Current Facility-Administered Medications  Medication Dose Route Frequency Provider Last Rate Last Admin   acetaminophen  (TYLENOL ) tablet 650 mg  650 mg Oral Q4H PRN Smith, Rondell A, MD       Or   acetaminophen  (TYLENOL ) 160 MG/5ML solution 650 mg  650 mg Per Tube Q4H PRN Smith, Rondell A, MD       Or   acetaminophen  (TYLENOL ) suppository 650 mg  650 mg Rectal Q4H PRN Smith, Rondell A, MD       albuterol  (PROVENTIL ) (2.5 MG/3ML) 0.083% nebulizer solution 2.5 mg  2.5 mg Nebulization Q6H PRN Claudene, Rondell A, MD        anastrozole  (ARIMIDEX ) tablet 1 mg  1 mg Oral Daily Smith, Rondell A, MD   1 mg at 11/08/23 0941   aspirin  EC tablet 81 mg  81 mg Oral Daily Smith, Rondell A, MD   81 mg at 11/08/23 0941   atorvastatin  (LIPITOR) tablet 40 mg  40 mg Oral Daily Smith, Rondell A, MD   40 mg at 11/08/23 0941   diltiazem  (CARDIZEM  CD) 24 hr capsule 120 mg  120 mg Oral Daily Sreeram, Narendranath, MD       feeding supplement (  ENSURE PLUS HIGH PROTEIN) liquid 237 mL  237 mL Oral BID BM Darci Pore, MD   237 mL at 11/08/23 1430   gabapentin  (NEURONTIN ) capsule 300 mg  300 mg Oral TID Smith, Rondell A, MD   300 mg at 11/08/23 0941   heparin  ADULT infusion 100 units/mL (25000 units/250mL)  700 Units/hr Intravenous Continuous Laron Agent, RPH 7 mL/hr at 11/07/23 1033 700 Units/hr at 11/07/23 1033   hydrALAZINE  (APRESOLINE ) injection 10 mg  10 mg Intravenous Q4H PRN Smith, Rondell A, MD       hydroxychloroquine  (PLAQUENIL ) tablet 200 mg  200 mg Oral Daily Claudene, Rondell A, MD   200 mg at 11/08/23 0941   levothyroxine  (SYNTHROID ) tablet 100 mcg  100 mcg Oral Daily Claudene Reeves A, MD   100 mcg at 11/08/23 9379   senna-docusate (Senokot-S) tablet 1 tablet  1 tablet Oral QHS PRN Claudene Reeves LABOR, MD         PHYSICAL EXAMINATION: ECOG PERFORMANCE STATUS: 1 - Symptomatic but completely ambulatory  Vitals:   11/08/23 1137 11/08/23 1517  BP: (!) 128/58 137/69  Pulse: 81 88  Resp: 16 18  Temp: 98.9 F (37.2 C) 98.9 F (37.2 C)  SpO2: 100% 100%     Filed Weights   11/06/23 1204  Weight: 120 lb (54.4 kg)    GENERAL:alert, no distress and comfortable Right sided weakness noted.  No cervical adenopathy CTA bilaterally RRR No abdominal tenderness.  LABORATORY DATA:  I have reviewed the data as listed Lab Results  Component Value Date   WBC 4.8 11/08/2023   HGB 9.8 (L) 11/08/2023   HCT 29.5 (L) 11/08/2023   MCV 85.8 11/08/2023   PLT 118 (L) 11/08/2023   Lab Results  Component Value Date   NA  133 (L) 11/06/2023   K 3.8 11/06/2023   CL 99 11/06/2023   CO2 24 11/06/2023    RADIOGRAPHIC STUDIES: I have personally reviewed the radiological reports and agreed with the findings in the report.  ASSESSMENT AND PLAN:  No problem-specific Assessment & Plan notes found for this encounter.  Assessment and Plan Assessment & Plan  Embolic CVA, presented with right sided weakness Likely non compliance, pt wasn't taking her eliquis  as prescribed. I discussed with pt and her daughter about the importance of being compliant with anticoagulation. Ok to resume Eliquis  upon discharge and follow up with cardiology and neurology for anticoagulation follow up. I dont think she will be a good candidate for injectable blood thinners.  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 Not a candidate for concurrent chemoradiation or immunotherapy alone based on TMB status. Discussed sequential chemotherapy and radiation, acknowledging radiation alone may not be curative. - No actionable mutations, PDL1 TPS score is 0. - She underwent definitive radiation alone.  - Surveillance with follow-up scans in a few months to assess radiation therapy effectiveness.  Invasive ductal carcinoma of right breast, post-mastectomy, on anastrozole  Post-mastectomy status for invasive ductal carcinoma of the right breast. Currently on anastrozole  with no reported side effects.  Normocytic normochromic anemia Likely secondary to chronic co-morbidites. Ok to monitor.  She doesn't see us  regularly, right now she is just on surveillance and every 6 months or so follow up.  Time spent: 30 min reviewing records, face to face with pt, counseling, coordination of care. All questions were answered. The patient knows to call the clinic with any problems, questions or concerns.  Amber Stalls, MD 10/29/23

## 2023-11-08 NOTE — TOC CAGE-AID Note (Signed)
 Transition of Care [facility]) - CAGE-AID Screening   Patient Details  Name: [name] MRN: [id number] Date of Birth: 09/09/1940  [facility] CM/SW Contact:    Waseem Suess E Ihsan Nomura, LCSW Phone Number: 11/08/2023, 9:05 AM   Clinical Narrative: No SA noted.   CAGE-AID Screening:    Have You Ever Felt You Ought to Cut Down on Your Drinking or Drug Use?: No Have People Annoyed You By Critizing Your Drinking Or Drug Use?: No Have You Felt Bad Or Guilty About Your Drinking Or Drug Use?: No Have You Ever Had a Drink or Used Drugs First Thing In The Morning to Steady Your Nerves or to Get Rid of a Hangover?: No CAGE-AID Score: 0  Substance Abuse Education Offered: No

## 2023-11-08 NOTE — Progress Notes (Signed)
 PHARMACY - ANTICOAGULATION CONSULT NOTE  Pharmacy Consult for heparin  Indication: stroke  Allergies  Allergen Reactions   Ciprofloxacin Itching, Swelling and Other (See Comments)    Possibly causing tremors?   Crestor  [Rosuvastatin ] Other (See Comments)    Myalgia and back pain   Lisinopril  Cough   Ofev  [Nintedanib] Nausea Only    Patient Measurements: Height: 5' 7 (170.2 cm) Weight: 54.4 kg (120 lb) IBW/kg (Calculated) : 61.6 HEPARIN  DW (KG): 54.4  Vital Signs: Temp: 98.9 F (37.2 C) (10/13 1137) Temp Source: Axillary (10/13 0430) BP: 128/58 (10/13 1137) Pulse Rate: 81 (10/13 1137)  Labs: Recent Labs    11/06/23 1258 11/06/23 1259 11/07/23 0653 11/07/23 1826 11/08/23 0206 11/08/23 0944  HGB 10.7*  --  9.9*  --  9.8*  --   HCT 33.4*  --  30.0*  --  29.5*  --   PLT 123*  --  117*  --  118*  --   APTT  --  26  --  84*  --  79*  LABPROT  --  18.4*  --   --   --   --   INR  --  1.4*  --   --   --   --   HEPARINUNFRC  --   --   --  >1.10* >1.10*  --   CREATININE 1.01*  --   --   --   --   --     Estimated Creatinine Clearance: 37.5 mL/min (A) (by C-G formula based on SCr of 1.01 mg/dL (H)).   Assessment: 52 yoF presented with right sided weakness. PMH includes afib (eliquis ) and recent diagnosis of adenocarcinoma of the lung (undergoing radiation), recent NSTEMI. Pharmacy consulted to dose heparin  for CVA  -CT head: no acute abnormalities  -MRI: bilateral embolic appearing infarcts (possible cardio-embolic source) -Will monitor with aPTTs given anti-Xa level will be falsely elevated with recent eliquis  use and will start heparin  gtt at timing of next dose of eliquis .  10/13 AM: Heparin  level >1.1 (due to Eliquis ), aPTT 79 sec (therapeutic) on 700 units/hr. No bleeding noted, CBC stable.  Goal of Therapy:  Heparin  level 0.3-0.5 units/ml aPTT 66-85 seconds Monitor platelets by anticoagulation protocol: Yes   Plan:  Contiue heparin  infusion at 700 units/hr   Daily aPTT/HL until correlating   Massie Fila, PharmD Clinical Pharmacist  11/08/2023 12:32 PM

## 2023-11-08 NOTE — Progress Notes (Signed)
 Progress Note   Patient: [name] FMW:[id number] DOB: August 14, 1940 DOA: 11/06/2023     2 DOS: the patient was seen and examined on 11/08/2023   Brief hospital course: BRAYLIE BADAMI is a 83 y.o. 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   MRI brain showed multiple acute infarcts in bilateral watershed distribution involving right frontal lobe right caudate head, posterior bilateral occipital lobes right lateral pons and posterior cerebellum suggesting cardioembolic source.  Neurology evaluated the patient recommended full stroke workup  Assessment and Plan: Acute ischemic stroke Embolic CVA- MRI brain reviewed shows multiple acute infarcts in watershed distribution. Neurology follow up appreciated. This could be related to recent heart cath vs high risk for hypercoagulability given Aflutter, lung cancer. Echo shows EF 60%, increased RVSP at 36.9 mmHg, mild MR, moderate AR Continue aspirin , statin.   She did miss few doses of Eliquis  per daughter.  Heparin  drip infusion per pharmacy protocol for now. Discussed with neurology who advised oncology input regarding anticoagulation. Can resume Eliquis  from tomorrow if oncology team agree. PT OT suggested CIR, awaiting auth  Paroxysmal A-flutter- Resumed Cardizem . Continue heparin  drip per pharmacy protocol.  Essential hypertension- Resumed Cardizem , if BP high will start her ARB therapy.  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 She is at risk for strokes due to her cancer history. Awaiting oncology input regarding long term anticoagulation regimen.  Type 2 diabetes mellitus: A1c 6 Hold oral  hypoglycemics. Continue Accu-Cheks, sliding scale insulin .  SLE-on Plaquenil .  Chronic anemia-stable.  Chronic hyponatremia-stable.  Trend sodium.  Hypothyroidism-continue home dose Synthroid     Out of bed to chair. Incentive spirometry. Nursing supportive care. Fall, aspiration precautions. Diet:  Diet Orders (From admission, onward)     Start     Ordered   11/06/23 1843  Diet heart healthy/carb modified Room service appropriate? Yes; Fluid consistency: Thin  Diet effective now       Question Answer Comment  Room service appropriate? Yes   Fluid consistency: Thin      11/06/23 1842           DVT prophylaxis:   Level of care: Telemetry Medical   Code Status: Full Code  Subjective: Patient is seen and examined today morning.  She is working with OT at bedside. Eating fair. No complaints. Discussed with family at bedside.   Physical Exam: Vitals:   11/08/23 0017 11/08/23 0430 11/08/23 0734 11/08/23 1137  BP: 123/69 130/78 124/71 (!) 128/58  Pulse: 87 83 80 81  Resp: 18 18 16 16   Temp: 98.6 F (37 C) (!) 97.5 F (36.4 C) 98.6 F (37 C) 98.9 F (37.2 C)  TempSrc: Oral Axillary    SpO2: 100% 100% 100% 100%  Weight:      Height:        General - Elderly thin built African-American female, no apparent distress HEENT - PERRLA, EOMI, atraumatic head, non tender sinuses. Lung - Clear, no rales, rhonchi, wheezes. Heart - S1, S2 heard, no murmurs, rubs, no pedal edema. Abdomen - Soft, non tender, bowel sounds good Neuro - Alert, awake and oriented, right-sided weakness Skin - Warm and dry.  Data Reviewed:      Latest Ref Rng & Units 11/08/2023    2:06  AM 11/07/2023    6:53 AM 11/06/2023   12:58 PM  CBC  WBC 4.0 - 10.5 K/uL 4.8  4.8  4.8   Hemoglobin 12.0 - 15.0 g/dL 9.8  9.9  89.2   Hematocrit 36.0 - 46.0 % 29.5  30.0  33.4   Platelets 150 - 400 K/uL 118  117  123       Latest Ref Rng & Units 11/06/2023   12:58 PM 11/02/2023    5:17 AM 11/01/2023     8:05 AM  BMP  Glucose 70 - 99 mg/dL 95  96  94   BUN 8 - 23 mg/dL 10  13  15    Creatinine 0.44 - 1.00 mg/dL 8.98  9.07  9.08   Sodium 135 - 145 mmol/L 133  129  132   Potassium 3.5 - 5.1 mmol/L 3.8  4.1  3.8   Chloride 98 - 111 mmol/L 99  99  98   CO2 22 - 32 mmol/L 24  22  22    Calcium  8.9 - 10.3 mg/dL 9.4  9.0  9.0    ECHOCARDIOGRAM COMPLETE Result Date: 11/07/2023    ECHOCARDIOGRAM REPORT   Patient Name:   [name] Date of Exam: 11/07/2023 Medical Rec #:  [id number]      Height:       67.0 in Accession #:    [id number]     Weight:       120.0 lb Date of Birth:  23-May-1940       BSA:          1.628 m Patient Age:    83 years       BP:           156/66 mmHg Patient Gender: F              HR:           88 bpm. Exam Location:  Inpatient Procedure: 2D Echo, Cardiac Doppler and Color Doppler (Both Spectral and Color            Flow Doppler were utilized during procedure). Indications:    Stroke I63.9  History:        Patient has prior history of Echocardiogram examinations, most                 recent 11/01/2023. CHF, Previous Myocardial Infarction and CAD,                 Stroke and CKD, stage 3, Arrythmias:Atrial Flutter,                 Signs/Symptoms:Dyspnea, Shortness of Breath and Syncope; Risk                 Factors:Hypertension and Diabetes.  Sonographer:    Thea Norlander RCS Referring Phys: MAXIMINO DELENA SHARPS  Sonographer Comments: Image acquisition challenging due to patient body habitus. IMPRESSIONS  1. Left ventricular ejection fraction, by estimation, is 60 to 65%. The left ventricle has normal function. The left ventricle has no regional wall motion abnormalities. There is moderate asymmetric left ventricular hypertrophy of the septal and basal segments. Left ventricular diastolic parameters are indeterminate.  2. Right ventricular systolic function is normal. The right ventricular size is normal. There is mildly elevated pulmonary artery systolic pressure. The estimated right ventricular  systolic pressure is 36.9 mmHg.  3. The mitral valve is degenerative. There is nodular thickening at the leatlet tips and loose chordal structure as well. Mild mitral  valve regurgitation.  4. The aortic valve is tricuspid. Aortic valve regurgitation is moderate.  5. The inferior vena cava is normal in size with greater than 50% respiratory variability, suggesting right atrial pressure of 3 mmHg. Comparison(s): Prior images reviewed side by side. LVEF 60-65%. Mild mitral regurgitation with degenerative valve. Aortic regurgitation is now moderate - no obvious vegetation. FINDINGS  Left Ventricle: Left ventricular ejection fraction, by estimation, is 60 to 65%. The left ventricle has normal function. The left ventricle has no regional wall motion abnormalities. The left ventricular internal cavity size was normal in size. There is  moderate asymmetric left ventricular hypertrophy of the septal and basal segments. Left ventricular diastolic parameters are indeterminate. Right Ventricle: The right ventricular size is normal. No increase in right ventricular wall thickness. Right ventricular systolic function is normal. There is mildly elevated pulmonary artery systolic pressure. The tricuspid regurgitant velocity is 2.91  m/s, and with an assumed right atrial pressure of 3 mmHg, the estimated right ventricular systolic pressure is 36.9 mmHg. Left Atrium: Left atrial size was normal in size. Right Atrium: Right atrial size was normal in size. Pericardium: There is no evidence of pericardial effusion. Presence of epicardial fat layer. Mitral Valve: The mitral valve is degenerative in appearance. Mild mitral valve regurgitation. Tricuspid Valve: The tricuspid valve is grossly normal. Tricuspid valve regurgitation is mild. Aortic Valve: The aortic valve is tricuspid. There is mild aortic valve annular calcification. Aortic valve regurgitation is moderate. Aortic valve peak gradient measures 6.7 mmHg. Pulmonic Valve: The  pulmonic valve was grossly normal. Pulmonic valve regurgitation is mild. Aorta: The aortic root and ascending aorta are structurally normal, with no evidence of dilitation. Venous: The inferior vena cava is normal in size with greater than 50% respiratory variability, suggesting right atrial pressure of 3 mmHg. IAS/Shunts: No atrial level shunt detected by color flow Doppler. Additional Comments: 3D was performed not requiring image post processing on an independent workstation and was indeterminate.  LEFT VENTRICLE PLAX 2D LVIDd:         3.50 cm   Diastology LVIDs:         2.00 cm   LV e' medial:    4.35 cm/s LV PW:         0.90 cm   LV E/e' medial:  17.8 LV IVS:        0.90 cm   LV e' lateral:   9.25 cm/s LVOT diam:     2.20 cm   LV E/e' lateral: 8.4 LV SV:         60 LV SV Index:   37 LVOT Area:     3.80 cm  RIGHT VENTRICLE             IVC RV S prime:     17.90 cm/s  IVC diam: 1.00 cm TAPSE (M-mode): 1.4 cm LEFT ATRIUM             Index       RIGHT ATRIUM          Index LA diam:        2.30 cm 1.41 cm/m  RA Area:     5.05 cm LA Vol (A2C):   15.9 ml 9.77 ml/m  RA Volume:   4.44 ml  2.73 ml/m LA Vol (A4C):   8.8 ml  5.42 ml/m LA Biplane Vol: 11.0 ml 6.76 ml/m  AORTIC VALVE AV Area (Vmax): 2.54 cm AV Vmax:        129.00 cm/s AV  Peak Grad:   6.7 mmHg LVOT Vmax:      86.20 cm/s LVOT Vmean:     57.500 cm/s LVOT VTI:       0.159 m  AORTA Ao Root diam: 3.10 cm Ao Asc diam:  3.20 cm MITRAL VALVE                TRICUSPID VALVE MV Area (PHT): 3.30 cm     TR Peak grad:   33.9 mmHg MV Decel Time: 230 msec     TR Vmax:        291.00 cm/s MV E velocity: 77.50 cm/s MV A velocity: 127.00 cm/s  SHUNTS MV E/A ratio:  0.61         Systemic VTI:  0.16 m                             Systemic Diam: 2.20 cm Jayson Sierras MD Electronically signed by Jayson Sierras MD Signature Date/Time: 11/07/2023/12:29:19 PM    Final    MR BRAIN WO CONTRAST Result Date: 11/06/2023 EXAM: MR Brain without Intravenous Contrast. CLINICAL  HISTORY: Stroke, follow up. Episodes of slurred speech yesterday. New onset right-sided weakness today. TECHNIQUE: Magnetic resonance images of the brain without intravenous contrast in multiple planes. CONTRAST: Without. COMPARISON: None provided. FINDINGS: BRAIN: No intracranial mass or hemorrhage. No midline shift or extra-axial fluid collection. No cerebellar tonsillar ectopia. The central arterial and venous flow voids are patent. Diffuse scattered punctate foci of restricted diffusion present throughout a watershed distribution bilaterally involving multiple small cortical infarcts over the convexities. Small acute infarcts are present in the inferior right frontal lobe and right caudate head. Multiple cortical infarcts are present along the posterior parietal lobes and occipital lobes bilaterally. A single right lateral pontine infarct is present. Posterior cerebellar infarcts are present bilaterally as well. T2 and FLAIR hyperintensities. Additional confluent periventricular T2 hyperintensities are present bilaterally. VENTRICLES: No hydrocephalus. ORBITS: The orbits are normal. SINUSES AND MASTOIDS: The sinuses and mastoid air cells are clear. BONES: No acute fracture or focal osseous lesion. IMPRESSION: 1. Multiple acute infarcts in a bilateral watershed distribution involving the convexities, inferior right frontal lobe, right caudate head, posterior parietal and occipital lobes, right lateral pons, and posterior cerebellum. Findings suggest a central embolic source 2. Confluent periventricular T2/FLAIR hyperintensities bilaterally, suggestive of chronic microvascular ischemic change. Electronically signed by: Lonni Necessary MD 11/06/2023 06:35 PM EDT RP Workstation: [id number]   MR ANGIO HEAD WO CONTRAST Result Date: 11/06/2023 EXAM: MR Angiography Head without intravenous Contrast. 11/06/2023 05:53:00 PM TECHNIQUE: Magnetic resonance angiography images of the head without intravenous contrast.  Multiplanar 2D and 3D reformatted images are provided for review. COMPARISON: None provided. CLINICAL HISTORY: Neuro deficit, acute, stroke suspected. 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 Both A2 segments filled from the left. No significant stenosis of the internal carotid arteries. No significant stenosis of the middle cerebral arteries. No aneurysm. POSTERIOR CIRCULATION: The left vertebral artery is a dominant vessel. No significant stenosis of the posterior cerebral arteries. No significant stenosis of the basilar artery. No aneurysm. IMPRESSION: 1. No significant intracranial arterial stenosis or vessel occlusion Electronically signed by: Lonni Necessary MD 11/06/2023 06:29 PM EDT RP Workstation: [id number]   MR ANGIO NECK WO CONTRAST Result Date: 11/06/2023 EXAM: MRA Neck without 11/06/2023 05:20:00 PM TECHNIQUE: Multiplanar multisequence MRA of the neck was performed without the administration of intravenous contrast. 2D and 3D reformatted images are provided for  review. Stenosis of the internal carotid arteries measured using NASCET criteria. COMPARISON: None available CLINICAL HISTORY: Neuro deficit, acute, stroke suspected. FINDINGS: AORTIC ARCH AND ARCH VESSELS: No dissection or arterial injury. No significant stenosis of the brachiocephalic or subclavian arteries. CAROTID ARTERIES: No dissection, arterial injury, or hemodynamically significant stenosis by NASCET criteria. No significant flow disturbance is demonstrated. VERTEBRAL ARTERIES: Flow is antegrade in the vertebral arteries bilaterally. No dissection, arterial injury, or significant stenosis. IMPRESSION: 1. No evidence of dissection, arterial injury, or hemodynamically significant stenosis in the aortic arch, arch vessels, carotid arteries, or vertebral arteries. Electronically signed by: Lonni Necessary MD 11/06/2023 06:16 PM EDT RP Workstation: [id number]    Family Communication: Discussed with  patient, family at bedside.  They understand and agree. All questions answered.  Disposition: Status is: Inpatient Remains inpatient appropriate because: Further stroke workup including TEE  Planned Discharge Destination: Rehab     Time spent: 51 minutes  Author: Concepcion Riser, MD 11/08/2023 2:58 PM Secure chat 7am to 7pm For on call review [email or web address].

## 2023-11-08 NOTE — Evaluation (Signed)
 Occupational Therapy Evaluation Patient Details Name: [name] MRN: [id number] DOB: 1941/01/07 Today's Date: 11/08/2023   History of Present Illness   [name] is a 83 y.o. female who presented to [facility] ED 11/06/23 with right-sided weakness and slurred speech. CT scan of the head did not note any acute abnormality. MRI demonstrated bilateral embolic appearing infarcts in a watershed distribution as well as infarcts in bilateral cerebellum. PMHx: arthritis, lupus, ILD, breast cancer s/p mastectomy 03/31/2023, HTN, T2DM, HLD, and frequent UTIs, A-Fib, and adenocarcinoma of the LLL. Of note, recent admission 5/25 for acute PE and new dx of lung cancer & 10/4-10/7 for NSTEMI.     Clinical Impressions Pt received in supine, agreeable for OT visit. Son at bedside. PTA, pt was indep with ADLs, IADLs, and mobility with RW following heart attack ~1 week ago. Currently, pt presents with RHB paresis, ? R inattention, and cog deficits. Very pleasant & participatory. Functionally, she required mod-max A for bed mobility and up to mod A to maintain midline sitting at EOB with encouraged WB through RUE. Needs min A for feeding and total A for LB ADLs. Will continue to monitor need for RUE splint. Pt and son updated on d/c recs.  Pt is currently functioning below baseline and would benefit from ongoing acute OT services to progress towards safe discharge and to facilitate return to prior level of function. Current recommendation is high-intensity post-acute rehab (> 3 hours/day).     If plan is discharge home, recommend the following:   Two people to help with walking and/or transfers;Two people to help with bathing/dressing/bathroom;Assistance with cooking/housework;Direct supervision/assist for medications management;Direct supervision/assist for financial management;Assist for transportation;Help with stairs or ramp for entrance     Functional Status Assessment   Patient has had a recent decline  in their functional status and demonstrates the ability to make significant improvements in function in a reasonable and predictable amount of time.     Equipment Recommendations   Other (comment) (defer)     Recommendations for Other Services   Rehab consult     Precautions/Restrictions   Precautions Precautions: Fall Recall of Precautions/Restrictions: Intact Precaution/Restrictions Comments: SBP <180 Restrictions Weight Bearing Restrictions Per Provider Order: No     Mobility Bed Mobility Overal bed mobility: Needs Assistance Bed Mobility: Supine to Sit, Sit to Supine     Supine to sit: Mod assist, HOB elevated, Used rails Sit to supine: Max assist, HOB elevated   General bed mobility comments: Exited to the R side, cues for RUE mgmt and assist to bring RLE off EOB. Inc A for sit>supine 2/2 fatigue.    Transfers                   General transfer comment: did not assess due to reduced static sitting balance at EOB; anticipate inc A needed 2/2 RHB paresis.      Balance Overall balance assessment: Needs assistance Sitting-balance support: Single extremity supported, Feet supported Sitting balance-Leahy Scale: Poor Sitting balance - Comments: seated EOB, needing mod A-bouts of CGA to sustain midline. Cued for WB through R hand on lap while seated EOB. Postural control: Right lateral lean, Posterior lean                                 ADL either performed or assessed with clinical judgement   ADL Overall ADL's : Needs assistance/impaired Eating/Feeding: Minimal assistance;Bed level Eating/Feeding Details (  indicate cue type and reason): using L hand (non-dominant) to feed self, needs A for container/beverage mgmt             Upper Body Dressing : Maximal assistance;Sitting Upper Body Dressing Details (indicate cue type and reason): adjusting gown Lower Body Dressing: Total assistance;Bed level Lower Body Dressing Details (indicate  cue type and reason): donning B socks     Toileting- Clothing Manipulation and Hygiene: Total assistance;Bed level Toileting - Clothing Manipulation Details (indicate cue type and reason): purewick             Vision Baseline Vision/History: 1 Wears glasses Ability to See in Adequate Light: 0 Adequate Patient Visual Report: No change from baseline Vision Assessment?: No apparent visual deficits;Wears glasses for reading     Perception Perception: Not tested (? R inattention)       Praxis         Pertinent Vitals/Pain Pain Assessment Pain Assessment: No/denies pain     Extremity/Trunk Assessment Upper Extremity Assessment Upper Extremity Assessment: Right hand dominant;RUE deficits/detail RUE Deficits / Details: shoulder flexion ~1/5, elbow flexion/extension ~0/5, grip strength ~2/5; intact sensation per pt report RUE Sensation: WNL RUE Coordination: decreased fine motor;decreased gross motor   Lower Extremity Assessment Lower Extremity Assessment: Defer to PT evaluation       Communication Communication Communication: Impaired Factors Affecting Communication: Difficulty expressing self;Reduced clarity of speech (very hypophonic)   Cognition Arousal: Alert Behavior During Therapy: WFL for tasks assessed/performed Cognition: Cognition impaired     Awareness: Online awareness impaired Memory impairment (select all impairments): Short-term memory Attention impairment (select first level of impairment): Selective attention Executive functioning impairment (select all impairments): Organization, Problem solving OT - Cognition Comments: pleasant & participatory; mildly delayed responses; reduced divided and selective attention                 Following commands: Impaired Following commands impaired: Follows one step commands with increased time, Follows multi-step commands inconsistently     Cueing  General Comments   Cueing Techniques: Verbal  cues;Tactile cues;Visual cues  supportive son present at beginning and end of session   Exercises     Shoulder Instructions      Home Living   Living Arrangements: Children Available Help at Discharge: Family;Available 24 hours/day Type of Home: House Home Access: Stairs to enter [employer] of Steps: 1 Entrance Stairs-Rails: Right;Left;Can reach both Home Layout: One level     Bathroom Shower/Tub: Chief Strategy Officer: Standard Bathroom Accessibility: Yes   Home Equipment: Cane - single point;Tub bench;BSC/3in1;Rollator (4 wheels)   Additional Comments: enjoys shopping at [employer]      Prior Functioning/Environment Prior Level of Function : Independent/Modified Independent             Mobility Comments: Reports using RW since NSTEMI ~1 week ago ADLs Comments: Family drives, has medications pre-dosed in a packet    OT Problem List: Decreased strength;Decreased range of motion;Decreased activity tolerance;Impaired balance (sitting and/or standing);Decreased cognition;Impaired UE functional use   OT Treatment/Interventions: Self-care/ADL training;Therapeutic exercise;Neuromuscular education;Energy conservation;Splinting;Therapeutic activities;Cognitive remediation/compensation;Patient/family education;Balance training      OT Goals(Current goals can be found in the care plan section)   Acute Rehab OT Goals Patient Stated Goal: to get better OT Goal Formulation: With patient/family Time For Goal Achievement: 11/22/23 Potential to Achieve Goals: Good   OT Frequency:  Min 2X/week    Co-evaluation              AM-PAC OT 6  Clicks Daily Activity     Outcome Measure Help from another person eating meals?: A Little Help from another person taking care of personal grooming?: A Lot Help from another person toileting, which includes using toliet, bedpan, or urinal?: Total Help from another person bathing (including washing, rinsing,  drying)?: A Lot Help from another person to put on and taking off regular upper body clothing?: A Lot Help from another person to put on and taking off regular lower body clothing?: Total 6 Click Score: 11   End of Session Nurse Communication: Mobility status  Activity Tolerance: Patient tolerated treatment well Patient left: in bed;with call bell/phone within reach;with bed alarm set  OT Visit Diagnosis: Unsteadiness on feet (R26.81);Feeding difficulties (R63.3);Hemiplegia and hemiparesis Hemiplegia - Right/Left: Right Hemiplegia - dominant/non-dominant: Dominant Hemiplegia - caused by: Cerebral infarction                Time: 9156-9085 OT Time Calculation (min): 31 min Charges:  OT General Charges $OT Visit: 1 Visit OT Evaluation $OT Eval Moderate Complexity: 1 Mod OT Treatments $Therapeutic Activity: 8-22 mins  Liliani Bobo D., MSOT, OTR/L Acute Rehabilitation Services [phone] Secure Chat Preferred  Rikki Milch 11/08/2023, 10:36 AM

## 2023-11-08 NOTE — Progress Notes (Addendum)
 Inpatient Rehab Coordinator Note:  I met with patient and son at bedside, daughter via phone to discuss CIR recommendations and goals/expectations of CIR stay.  We reviewed 3 hrs/day of therapy, physician follow up, and average length of stay 2 weeks (dependent upon progress) with goals of mod I. Patient and family are interested in pursuing CIR, patient lives with daughter. Will send information to insurance for prior authorization. Continue to follow.   Rehab Admissons Coordinator Merlean Pizzini, [place], [place] [phone]

## 2023-11-08 NOTE — TOC Initial Note (Signed)
 Transition of Care [facility]) - Initial/Assessment Note    Patient Details  Name: [name] MRN: [id number] Date of Birth: Dec 01, 1940  Transition of Care [facility]) CM/SW Contact:    Andrez JULIANNA George, RN Phone Number: 11/08/2023, 10:24 AM  Clinical Narrative:                  [name] is a 83 y.o. 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   Pt is from home with her daughter. Daughter is with her all the time.  Pt was managing her own medications.  [facility] provides needed transportation.  Current recommendations are for CiR. Awaiting eval.  IP Care management following.  Expected Discharge Plan: IP Rehab Facility Barriers to Discharge: Continued Medical Work up   Patient Goals and CMS Choice   CMS Medicare.gov Compare Post Acute Care list provided to:: Patient Choice offered to / list presented to : Patient, Adult Children      Expected Discharge Plan and Services   Discharge Planning Services: CM Consult Post Acute Care Choice: IP Rehab Living arrangements for the past 2 months: Single Family Home                                      Prior Living Arrangements/Services Living arrangements for the past 2 months: Single Family Home Lives with:: Adult Children Patient language and need for interpreter reviewed:: Yes Do you feel safe going back to the place where you live?: Yes        Care giver support system in place?: Yes (comment) Current home services: DME (cane/ walker/ shower seat) Criminal Activity/Legal Involvement Pertinent to Current Situation/Hospitalization: No - Comment as needed  Activities of Daily Living   ADL Screening (condition at time of  admission) Independently performs ADLs?: No Does the patient have a NEW difficulty with bathing/dressing/toileting/self-feeding that is expected to last >3 days?: Yes (Initiates electronic notice to provider for possible OT consult) Does the patient have a NEW difficulty with getting in/out of bed, walking, or climbing stairs that is expected to last >3 days?: Yes (Initiates electronic notice to provider for possible PT consult) Does the patient have a NEW difficulty with communication that is expected to last >3 days?: No Is the patient deaf or have difficulty hearing?: No Does the patient have difficulty seeing, even when wearing glasses/contacts?: No Does the patient have difficulty concentrating, remembering, or making decisions?: No  Permission Sought/Granted                  Emotional Assessment Appearance:: Appears stated age Attitude/Demeanor/Rapport: Engaged Affect (typically observed): Accepting Orientation: : Oriented to Self, Oriented to Place   Psych Involvement: No (comment)  Admission diagnosis:  CVA (cerebral vascular accident) (HCC) [I63.9] Right sided weakness [R53.1] Cerebrovascular accident (CVA), unspecified mechanism (HCC) [I63.9] Patient Active Problem List   Diagnosis Date Noted   Right sided weakness 11/06/2023   Normocytic anemia 11/06/2023   History of lung cancer 11/06/2023   CVA (cerebral vascular accident) (HCC) 11/06/2023   NSTEMI (non-ST elevated myocardial infarction) (HCC) 10/30/2023   Allergic rhinitis due to pollen 10/19/2023   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   Abnormal gait 10/19/2023  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   Bloating 10/19/2023   Centrilobular emphysema (HCC) 10/19/2023   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   Diabetic mononeuropathy associated with type 2 diabetes mellitus (HCC) 10/19/2023   Family history of colon cancer in father 10/19/2023   Flatulence, eructation and gas pain 10/19/2023    Gastroesophageal reflux disease 10/19/2023   History of colonic polyps 10/19/2023   High glucose level 10/19/2023   Headache disorder 10/19/2023   Hyperglycemia due to type 2 diabetes mellitus (HCC) 10/19/2023   History of endocarditis in adulthood 10/19/2023   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/19/2023   Iron deficiency anemia 10/19/2023   Parietoalveolar pneumopathy (HCC) 10/19/2023   Osteoarthritis 10/19/2023   Neuropathy 10/19/2023   Personal history of malignant neoplasm of breast 10/19/2023   Myopathy, unspecified 10/19/2023   Pure hypercholesterolemia 10/19/2023   Stage 3a chronic kidney disease (HCC) 10/19/2023   Traumatic plantar fasciitis 10/19/2023   Atherosclerosis of coronary artery 10/19/2023   CAD S/P percutaneous coronary angioplasty 10/19/2023   Chronic systolic heart failure (HCC) 10/19/2023   Type 2 diabetes mellitus with other specified complication (HCC) 10/19/2023   Adverse effect of antihyperlipidemic and antiarteriosclerotic drugs, initial encounter 10/19/2023   Adverse effect of antihyperlipidemic drug 10/19/2023   Family history of malignant neoplasm of gastrointestinal tract 10/19/2023   Shortness of breath 10/19/2023   Encounter for screening for malignant neoplasm of colon 10/19/2023   Decreased estrogen level 10/19/2023   Elevated TSH 10/19/2023   Acquired hypothyroidism 10/19/2023   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   Systemic lupus erythematosus (HCC) 10/19/2023   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) 10/19/2023   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) 10/19/2023   Malignant neoplasm of lung (HCC) 10/19/2023   Essential hypertension 10/19/2023   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) 07/22/2023   Mediastinal adenopathy 06/29/2023   Lung mass 06/20/2023   Pulmonary embolism (HCC) 06/19/2023   S/P left mastectomy 03/31/2023   Genetic testing 03/10/2023   Family history  of malignant neoplasm of breast 02/26/2023   Family history of malignant neoplasm of prostate 02/26/2023   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) 02/18/2023   Long term (current) use of anticoagulants 10/13/2022   Sepsis due to pneumonia (HCC) 10/11/2022   AKI (acute kidney injury) 10/11/2022   Paroxysmal atrial flutter (HCC) 10/11/2022   Diarrhea 10/11/2022   Controlled type 2 diabetes mellitus without complication, without long-term current use of insulin  (HCC) 10/11/2022   Mixed hyperlipidemia 10/11/2022   Atherosclerosis of aorta 10/11/2022   Diabetes mellitus type 2, noninsulin dependent (HCC) 10/11/2022   Benign hypertension 10/11/2022   Acute febrile illness 10/11/2022   Atrial flutter with rapid ventricular response (HCC) 10/11/2022   Chronic diastolic CHF (congestive heart failure) (HCC) 10/11/2022   Syncope 04/25/2022   Near syncope 02/18/2019   Hyponatremia 02/18/2019   ILD (interstitial lung disease) (HCC) 07/30/2016   Dyspnea 07/16/2016   Bibasilar crackles 07/16/2016   History of systemic lupus erythematosus (SLE) (HCC) 07/16/2016   Cough 07/16/2016   Encounter for monitoring ACE-inhibitor therapy 07/16/2016   History of asthma 07/16/2016   Hypothyroidism 01/03/2015   Primary hypertension 01/03/2015   UTI (urinary tract infection) 01/03/2015   Drug-induced hypersensitivity reaction 01/03/2015   Lupus 01/03/2015   Pyrexia 01/02/2015   PCP:  [name], MD Pharmacy:   [facility] [place], [place] - [address] [place] [address] Phone: [phone] Fax: [phone]  [facility] [address] [place] [address] Phone: [phone] Fax: [phone]     Social Drivers of Health (SDOH) Social History: SDOH Screenings   Food Insecurity: Food Insecurity Present (11/07/2023)  Housing: High Risk (11/07/2023)  Transportation Needs: No Transportation Needs  (11/07/2023)  Utilities: Not At Risk (11/07/2023)  Depression (PHQ2-9): Low Risk  (08/20/2023)  Social Connections: Moderately Integrated (11/07/2023)  Tobacco Use: Unknown (10/30/2023)  Recent Concern: Tobacco Use - High Risk (09/28/2023)   SDOH Interventions:     Readmission Risk Interventions     No data to display

## 2023-11-08 NOTE — Progress Notes (Signed)
 STROKE TEAM PROGRESS NOTE    SIGNIFICANT HOSPITAL EVENTS  10/11: Presented to ED due to transient dysarthria.  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  Patient undergoing radiation for adenocarcinoma of lung.  Recent cardiac cath with discharge from hospital 10/8.  Patient daughter endorsed missing evening doses of Eliquis  10/8 and 10/9.  INTERIM HISTORY/SUBJECTIVE Daughter and other family member at the bedside.  Patient overnight no acute event, neuro stable. Daughter stated that when she was discharged Wednesday after the last admission for NSTEMI, she was imbalance when getting out of car, then at home seems less talking until last Thursday she seems to have significant neuro change. She was taking all her morning meds but not evening meds.    OBJECTIVE  CBC    Component Value Date/Time   WBC 4.8 11/08/2023 0206   RBC 3.44 (L) 11/08/2023 0206   HGB 9.8 (L) 11/08/2023 0206   HGB 11.4 (L) 08/31/2023 1349   HGB 11.7 01/07/2023 1439   HCT 29.5 (L) 11/08/2023 0206   HCT 36.4 01/07/2023 1439   PLT 118 (L) 11/08/2023 0206   PLT 203 08/31/2023 1349   PLT 257 01/07/2023 1439   MCV 85.8 11/08/2023 0206   MCV 89 01/07/2023 1439   MCH 28.5 11/08/2023 0206   MCHC 33.2 11/08/2023 0206   RDW 16.7 (H) 11/08/2023 0206   RDW 13.9 01/07/2023 1439   LYMPHSABS 0.3 (L) 11/06/2023 1258   LYMPHSABS 1.7 04/25/2022 1223   MONOABS 0.5 11/06/2023 1258   EOSABS 0.1 11/06/2023 1258   EOSABS 0.1 04/25/2022 1223   BASOSABS 0.0 11/06/2023 1258   BASOSABS 0.0 04/25/2022 1223    BMET    Component Value Date/Time   NA 133 (L) 11/06/2023 1258   NA 138 01/07/2023 1439   K 3.8 11/06/2023 1258   CL 99 11/06/2023 1258   CO2 24 11/06/2023 1258   GLUCOSE 95 11/06/2023 1258   BUN 10 11/06/2023 1258   BUN 12 01/07/2023 1439   CREATININE 1.01 (H) 11/06/2023 1258   CREATININE 1.10 (H) 08/31/2023 1349   CALCIUM  9.4 11/06/2023 1258   EGFR 48 (L) 01/07/2023  1439   GFRNONAA 56 (L) 11/06/2023 1258   GFRNONAA 50 (L) 08/31/2023 1349    IMAGING past 24 hours No results found.   Vitals:   11/08/23 0430 11/08/23 0734 11/08/23 1137 11/08/23 1517  BP: 130/78 124/71 (!) 128/58 137/69  Pulse: 83 80 81 88  Resp: 18 16 16 18   Temp: (!) 97.5 F (36.4 C) 98.6 F (37 C) 98.9 F (37.2 C) 98.9 F (37.2 C)  TempSrc: Axillary     SpO2: 100% 100% 100% 100%  Weight:      Height:         PHYSICAL EXAM General:  Alert, well-nourished, well-developed patient in no acute distress Psych:  Mood and affect appropriate for situation CV: Regular rate and rhythm on monitor Respiratory:  Regular, unlabored respirations on room air GI: Abdomen soft and nontender   NEURO:  Mental Status: AA&O, additional time needed for subsequent questions.  Speech/Language: Mild dysarthria and aphasia present, paucity of speech.   Cranial Nerves:  II: PERRL. ?decreased  III, IV, VI: EOMI. Eyelids elevate symmetrically.  V: Sensation is intact to light touch and symmetrical to face.  VII: Face is symmetrical resting and smiling VIII: hearing intact to voice. IX, X: Palate elevates symmetrically. Phonation is normal.  KP:Dynloizm shrug 5/5. XII: tongue is midline without fasciculations. Motor:  RUE:  4-/5, mild drift RLE: 4-/5, moderate drift Tone: is normal and bulk is normal Sensation- Intact to light touch bilaterally. Extinction absent to light touch to DSS.   Coordination: FTN intact bilaterally, slower on right.   Gait- deferred  Most Recent NIH: 9    ASSESSMENT/PLAN  [name] is a 83 y.o. female with history of  A-fib on Eliquis , CAD, recently diagnosed adenocarcinoma of the lung undergoing radiation treatment, history of breast cancer diagnosed last year who was recently discharged from the hospital after being admitted for NSTEMI.  She presents with slurred speech and right-sided weakness along with inconsistent responses to visual field  testing in the right Hemi visual field. NIH on Admission: 5.  Stroke:  bilateral embolic shower, embolic pattern, etiology:  Afib and missed Eliquis  doses vs from NSTEMI with cardiac cath CT Head without contrast negative for a large hypodensity concerning for a large territory infarct or hyperdensity concerning for an ICH MRI Brain Multiple acute infarcts in a bilateral watershed distribution involving the convexities, inferior right frontal lobe, right caudate head, posterior parietal and occipital lobes, right lateral pons, and posterior cerebellum. Findings suggest a central embolic source MRA head and neck No LVO, no significant stenosis. 2D Echo: EF 60%, mild MR, moderate AR LDL 74 HgbA1c 6.0 VTE prophylaxis - heparin  IV Eliquis  5mg  BID (noted missed doses) and ASA 81 prior to admission, now on aspirin  81 mg daily and heparin  IV. Will switch heparin  IV to eliquis  today.  Therapy recommendations:  CIR Disposition:  pending  Paroxysmal Atrial Flutter/Afib Home Meds: eliquis , pt admitted that she missed 2-3 days of eliquis  Continue telemetry monitoring Now on anticoagulation with Heparin  IV  Will switch heparin  IV to eliquis  today.  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 Francyne note 10/7, recommended Eliquis  5 mg twice daily and also adding Plavix daily for 1 month only.  However patient was discharged on Eliquis  only.  Per notes, patient called cardiology office.  Dr. Ladona stated patient should take aspirin  81 mg for 30 days and then stop. Now on ASA 81. Will need cardiology input on final regimen.   Hypertension Home meds:  Hyzaar daily, cardizem  daily, aldactone  daily Stable Long term BP goal: normotensive  Hyperlipidemia Home meds:  lipitor 40mg , resumed in hospital LDL 74, goal < 70 On home Lipitor 40mg  Continue statin at  discharge  Diabetes type II Controlled Home meds:  glimepride HgbA1c 6.0, goal < 7.0 CBGs SSI Recommend close follow-up with PCP   Other Stroke Risk Factors Advanced Age  Other medical issues R breast cancer post-mastectomy, on anastrozole   Stage 3a lung Adenocarcinoma left lower lobe, on radiation  Hospital day # 2   Ary Cummins, MD PhD Stroke Neurology 11/08/2023 5:49 PM    To contact Stroke Continuity provider, please refer to [email or web address]. After hours, contact General Neurology

## 2023-11-08 NOTE — Progress Notes (Signed)
 PHARMACY - ANTICOAGULATION CONSULT NOTE  Pharmacy Consult for heparin  Indication: stroke  Allergies  Allergen Reactions   Ciprofloxacin Itching, Swelling and Other (See Comments)    Possibly causing tremors?   Crestor  [Rosuvastatin ] Other (See Comments)    Myalgia and back pain   Lisinopril  Cough   Ofev  [Nintedanib] Nausea Only    Patient Measurements: Height: 5' 7 (170.2 cm) Weight: 54.4 kg (120 lb) IBW/kg (Calculated) : 61.6 HEPARIN  DW (KG): 54.4  Vital Signs: Temp: 98.9 F (37.2 C) (10/13 1517) BP: 137/69 (10/13 1517) Pulse Rate: 88 (10/13 1517)  Labs: Recent Labs    11/06/23 1258 11/06/23 1259 11/07/23 0653 11/07/23 1826 11/08/23 0206 11/08/23 0944  HGB 10.7*  --  9.9*  --  9.8*  --   HCT 33.4*  --  30.0*  --  29.5*  --   PLT 123*  --  117*  --  118*  --   APTT  --  26  --  84*  --  79*  LABPROT  --  18.4*  --   --   --   --   INR  --  1.4*  --   --   --   --   HEPARINUNFRC  --   --   --  >1.10* >1.10*  --   CREATININE 1.01*  --   --   --   --   --     Estimated Creatinine Clearance: 37.5 mL/min (A) (by C-G formula based on SCr of 1.01 mg/dL (H)).   Assessment: 39 yoF presented with right sided weakness. PMH includes afib (eliquis ) and recent diagnosis of adenocarcinoma of the lung (undergoing radiation), recent NSTEMI. Pharmacy consulted to dose heparin  for CVA  -CT head: no acute abnormalities  -MRI: bilateral embolic appearing infarcts (possible cardio-embolic source) -Will monitor with aPTTs given anti-Xa level will be falsely elevated with recent eliquis  use and will start heparin  gtt at timing of next dose of eliquis .  10/13 AM: Heparin  level >1.1 (due to Eliquis ), aPTT 79 sec (therapeutic) on 700 units/hr. No bleeding noted, CBC stable.  10/13 PM: to transition back to Eliquis  - based on age > 80 and weight < 60kg, will use reduced dose of 2.5mg  PO bid  Goal of Therapy:  Heparin  level 0.3-0.5 units/ml aPTT 66-85 seconds Monitor platelets by  anticoagulation protocol: Yes   Plan:  At 22:00, stop IV heparin  and begin Eliquis  2.5mg  PO BID  Rocky Slade, PharmD, BCPS Clinical Pharmacist  11/08/2023 5:56 PM

## 2023-11-08 NOTE — Plan of Care (Signed)
   Problem: Education: Goal: Knowledge of disease or condition will improve Outcome: Progressing Goal: Knowledge of secondary prevention will improve (MUST DOCUMENT ALL) Outcome: Progressing

## 2023-11-09 ENCOUNTER — Other Ambulatory Visit: Payer: Self-pay

## 2023-11-09 ENCOUNTER — Encounter (HOSPITAL_COMMUNITY): Payer: Self-pay | Admitting: Internal Medicine

## 2023-11-09 ENCOUNTER — Encounter (HOSPITAL_COMMUNITY): Payer: Self-pay | Admitting: Physical Medicine & Rehabilitation

## 2023-11-09 ENCOUNTER — Inpatient Hospital Stay (HOSPITAL_COMMUNITY)
Admission: AD | Admit: 2023-11-09 | Discharge: 2023-12-09 | DRG: 056 | Disposition: A | Discharge status: Skilled Nursing Facility | Source: Intra-hospital | Attending: Physical Medicine & Rehabilitation | Admitting: Physical Medicine & Rehabilitation

## 2023-11-09 DIAGNOSIS — I5020 Unspecified systolic (congestive) heart failure: Secondary | ICD-10-CM | POA: Diagnosis not present

## 2023-11-09 DIAGNOSIS — I69351 Hemiplegia and hemiparesis following cerebral infarction affecting right dominant side: Principal | ICD-10-CM

## 2023-11-09 DIAGNOSIS — K224 Dyskinesia of esophagus: Secondary | ICD-10-CM | POA: Diagnosis not present

## 2023-11-09 DIAGNOSIS — M19012 Primary osteoarthritis, left shoulder: Secondary | ICD-10-CM | POA: Diagnosis not present

## 2023-11-09 DIAGNOSIS — I252 Old myocardial infarction: Secondary | ICD-10-CM

## 2023-11-09 DIAGNOSIS — M7918 Myalgia, other site: Secondary | ICD-10-CM | POA: Diagnosis not present

## 2023-11-09 DIAGNOSIS — G8191 Hemiplegia, unspecified affecting right dominant side: Principal | ICD-10-CM

## 2023-11-09 DIAGNOSIS — R059 Cough, unspecified: Secondary | ICD-10-CM | POA: Diagnosis not present

## 2023-11-09 DIAGNOSIS — N390 Urinary tract infection, site not specified: Secondary | ICD-10-CM | POA: Diagnosis not present

## 2023-11-09 DIAGNOSIS — R64 Cachexia: Secondary | ICD-10-CM | POA: Diagnosis present

## 2023-11-09 DIAGNOSIS — E222 Syndrome of inappropriate secretion of antidiuretic hormone: Secondary | ICD-10-CM | POA: Diagnosis present

## 2023-11-09 DIAGNOSIS — R040 Epistaxis: Secondary | ICD-10-CM | POA: Diagnosis not present

## 2023-11-09 DIAGNOSIS — J849 Interstitial pulmonary disease, unspecified: Secondary | ICD-10-CM | POA: Diagnosis present

## 2023-11-09 DIAGNOSIS — I10 Essential (primary) hypertension: Secondary | ICD-10-CM | POA: Diagnosis present

## 2023-11-09 DIAGNOSIS — Z79899 Other long term (current) drug therapy: Secondary | ICD-10-CM

## 2023-11-09 DIAGNOSIS — D508 Other iron deficiency anemias: Secondary | ICD-10-CM | POA: Diagnosis not present

## 2023-11-09 DIAGNOSIS — I700 Atherosclerosis of aorta: Secondary | ICD-10-CM | POA: Diagnosis not present

## 2023-11-09 DIAGNOSIS — K219 Gastro-esophageal reflux disease without esophagitis: Secondary | ICD-10-CM | POA: Diagnosis present

## 2023-11-09 DIAGNOSIS — M3213 Lung involvement in systemic lupus erythematosus: Secondary | ICD-10-CM | POA: Diagnosis present

## 2023-11-09 DIAGNOSIS — Z66 Do not resuscitate: Secondary | ICD-10-CM | POA: Diagnosis present

## 2023-11-09 DIAGNOSIS — Z794 Long term (current) use of insulin: Secondary | ICD-10-CM | POA: Diagnosis not present

## 2023-11-09 DIAGNOSIS — F32A Depression, unspecified: Secondary | ICD-10-CM | POA: Diagnosis present

## 2023-11-09 DIAGNOSIS — D696 Thrombocytopenia, unspecified: Secondary | ICD-10-CM | POA: Diagnosis present

## 2023-11-09 DIAGNOSIS — C50512 Malignant neoplasm of lower-outer quadrant of left female breast: Secondary | ICD-10-CM | POA: Diagnosis not present

## 2023-11-09 DIAGNOSIS — Z7982 Long term (current) use of aspirin: Secondary | ICD-10-CM

## 2023-11-09 DIAGNOSIS — Z860100 Personal history of colon polyps, unspecified: Secondary | ICD-10-CM

## 2023-11-09 DIAGNOSIS — Z681 Body mass index (BMI) 19 or less, adult: Secondary | ICD-10-CM | POA: Diagnosis not present

## 2023-11-09 DIAGNOSIS — B370 Candidal stomatitis: Secondary | ICD-10-CM | POA: Diagnosis not present

## 2023-11-09 DIAGNOSIS — I5032 Chronic diastolic (congestive) heart failure: Secondary | ICD-10-CM | POA: Diagnosis present

## 2023-11-09 DIAGNOSIS — C771 Secondary and unspecified malignant neoplasm of intrathoracic lymph nodes: Secondary | ICD-10-CM | POA: Diagnosis present

## 2023-11-09 DIAGNOSIS — E119 Type 2 diabetes mellitus without complications: Secondary | ICD-10-CM | POA: Diagnosis present

## 2023-11-09 DIAGNOSIS — I2510 Atherosclerotic heart disease of native coronary artery without angina pectoris: Secondary | ICD-10-CM | POA: Diagnosis present

## 2023-11-09 DIAGNOSIS — Z888 Allergy status to other drugs, medicaments and biological substances status: Secondary | ICD-10-CM

## 2023-11-09 DIAGNOSIS — I110 Hypertensive heart disease with heart failure: Secondary | ICD-10-CM | POA: Diagnosis not present

## 2023-11-09 DIAGNOSIS — M25512 Pain in left shoulder: Secondary | ICD-10-CM | POA: Diagnosis not present

## 2023-11-09 DIAGNOSIS — M3211 Endocarditis in systemic lupus erythematosus: Secondary | ICD-10-CM | POA: Diagnosis present

## 2023-11-09 DIAGNOSIS — C50911 Malignant neoplasm of unspecified site of right female breast: Secondary | ICD-10-CM | POA: Diagnosis present

## 2023-11-09 DIAGNOSIS — D649 Anemia, unspecified: Secondary | ICD-10-CM | POA: Diagnosis present

## 2023-11-09 DIAGNOSIS — E871 Hypo-osmolality and hyponatremia: Secondary | ICD-10-CM | POA: Diagnosis present

## 2023-11-09 DIAGNOSIS — Z91148 Patient's other noncompliance with medication regimen for other reason: Secondary | ICD-10-CM

## 2023-11-09 DIAGNOSIS — E1169 Type 2 diabetes mellitus with other specified complication: Secondary | ICD-10-CM | POA: Diagnosis not present

## 2023-11-09 DIAGNOSIS — R630 Anorexia: Secondary | ICD-10-CM | POA: Diagnosis not present

## 2023-11-09 DIAGNOSIS — R29709 NIHSS score 9: Secondary | ICD-10-CM | POA: Diagnosis not present

## 2023-11-09 DIAGNOSIS — R195 Other fecal abnormalities: Secondary | ICD-10-CM | POA: Diagnosis not present

## 2023-11-09 DIAGNOSIS — R627 Adult failure to thrive: Secondary | ICD-10-CM | POA: Diagnosis present

## 2023-11-09 DIAGNOSIS — Z825 Family history of asthma and other chronic lower respiratory diseases: Secondary | ICD-10-CM

## 2023-11-09 DIAGNOSIS — Z515 Encounter for palliative care: Secondary | ICD-10-CM | POA: Diagnosis not present

## 2023-11-09 DIAGNOSIS — I639 Cerebral infarction, unspecified: Secondary | ICD-10-CM | POA: Diagnosis not present

## 2023-11-09 DIAGNOSIS — R590 Localized enlarged lymph nodes: Secondary | ICD-10-CM | POA: Diagnosis present

## 2023-11-09 DIAGNOSIS — Q393 Congenital stenosis and stricture of esophagus: Secondary | ICD-10-CM | POA: Diagnosis not present

## 2023-11-09 DIAGNOSIS — M542 Cervicalgia: Secondary | ICD-10-CM | POA: Diagnosis not present

## 2023-11-09 DIAGNOSIS — R070 Pain in throat: Secondary | ICD-10-CM | POA: Diagnosis not present

## 2023-11-09 DIAGNOSIS — Z7901 Long term (current) use of anticoagulants: Secondary | ICD-10-CM | POA: Diagnosis not present

## 2023-11-09 DIAGNOSIS — N179 Acute kidney failure, unspecified: Secondary | ICD-10-CM | POA: Diagnosis not present

## 2023-11-09 DIAGNOSIS — I480 Paroxysmal atrial fibrillation: Secondary | ICD-10-CM | POA: Diagnosis present

## 2023-11-09 DIAGNOSIS — R531 Weakness: Secondary | ICD-10-CM | POA: Diagnosis not present

## 2023-11-09 DIAGNOSIS — Z860101 Personal history of adenomatous and serrated colon polyps: Secondary | ICD-10-CM

## 2023-11-09 DIAGNOSIS — Z881 Allergy status to other antibiotic agents status: Secondary | ICD-10-CM

## 2023-11-09 DIAGNOSIS — M25511 Pain in right shoulder: Secondary | ICD-10-CM | POA: Diagnosis present

## 2023-11-09 DIAGNOSIS — E43 Unspecified severe protein-calorie malnutrition: Secondary | ICD-10-CM | POA: Diagnosis present

## 2023-11-09 DIAGNOSIS — Z800 Family history of malignant neoplasm of digestive organs: Secondary | ICD-10-CM

## 2023-11-09 DIAGNOSIS — J8409 Other alveolar and parieto-alveolar conditions: Secondary | ICD-10-CM | POA: Diagnosis present

## 2023-11-09 DIAGNOSIS — I4892 Unspecified atrial flutter: Secondary | ICD-10-CM | POA: Diagnosis present

## 2023-11-09 DIAGNOSIS — Z7984 Long term (current) use of oral hypoglycemic drugs: Secondary | ICD-10-CM

## 2023-11-09 DIAGNOSIS — C3490 Malignant neoplasm of unspecified part of unspecified bronchus or lung: Secondary | ICD-10-CM | POA: Diagnosis not present

## 2023-11-09 DIAGNOSIS — K5900 Constipation, unspecified: Secondary | ICD-10-CM | POA: Diagnosis present

## 2023-11-09 DIAGNOSIS — D509 Iron deficiency anemia, unspecified: Secondary | ICD-10-CM | POA: Diagnosis present

## 2023-11-09 DIAGNOSIS — C50519 Malignant neoplasm of lower-outer quadrant of unspecified female breast: Secondary | ICD-10-CM | POA: Diagnosis present

## 2023-11-09 DIAGNOSIS — I6340 Cerebral infarction due to embolism of unspecified cerebral artery: Secondary | ICD-10-CM | POA: Diagnosis not present

## 2023-11-09 DIAGNOSIS — M329 Systemic lupus erythematosus, unspecified: Secondary | ICD-10-CM | POA: Diagnosis present

## 2023-11-09 DIAGNOSIS — E440 Moderate protein-calorie malnutrition: Secondary | ICD-10-CM | POA: Diagnosis not present

## 2023-11-09 DIAGNOSIS — M1990 Unspecified osteoarthritis, unspecified site: Secondary | ICD-10-CM | POA: Diagnosis present

## 2023-11-09 DIAGNOSIS — K449 Diaphragmatic hernia without obstruction or gangrene: Secondary | ICD-10-CM | POA: Diagnosis not present

## 2023-11-09 DIAGNOSIS — Z9012 Acquired absence of left breast and nipple: Secondary | ICD-10-CM

## 2023-11-09 DIAGNOSIS — R54 Age-related physical debility: Secondary | ICD-10-CM | POA: Diagnosis present

## 2023-11-09 DIAGNOSIS — J90 Pleural effusion, not elsewhere classified: Secondary | ICD-10-CM | POA: Diagnosis not present

## 2023-11-09 DIAGNOSIS — E039 Hypothyroidism, unspecified: Secondary | ICD-10-CM | POA: Diagnosis present

## 2023-11-09 DIAGNOSIS — R1310 Dysphagia, unspecified: Secondary | ICD-10-CM | POA: Diagnosis present

## 2023-11-09 DIAGNOSIS — E785 Hyperlipidemia, unspecified: Secondary | ICD-10-CM | POA: Diagnosis present

## 2023-11-09 DIAGNOSIS — D630 Anemia in neoplastic disease: Secondary | ICD-10-CM | POA: Diagnosis present

## 2023-11-09 DIAGNOSIS — K5901 Slow transit constipation: Secondary | ICD-10-CM | POA: Diagnosis not present

## 2023-11-09 DIAGNOSIS — M47812 Spondylosis without myelopathy or radiculopathy, cervical region: Secondary | ICD-10-CM | POA: Diagnosis present

## 2023-11-09 DIAGNOSIS — Z79811 Long term (current) use of aromatase inhibitors: Secondary | ICD-10-CM

## 2023-11-09 DIAGNOSIS — D500 Iron deficiency anemia secondary to blood loss (chronic): Secondary | ICD-10-CM | POA: Diagnosis not present

## 2023-11-09 DIAGNOSIS — G629 Polyneuropathy, unspecified: Secondary | ICD-10-CM

## 2023-11-09 DIAGNOSIS — Z803 Family history of malignant neoplasm of breast: Secondary | ICD-10-CM

## 2023-11-09 DIAGNOSIS — R7989 Other specified abnormal findings of blood chemistry: Secondary | ICD-10-CM | POA: Diagnosis not present

## 2023-11-09 DIAGNOSIS — Z79890 Hormone replacement therapy (postmenopausal): Secondary | ICD-10-CM

## 2023-11-09 DIAGNOSIS — R918 Other nonspecific abnormal finding of lung field: Secondary | ICD-10-CM | POA: Diagnosis not present

## 2023-11-09 DIAGNOSIS — Z923 Personal history of irradiation: Secondary | ICD-10-CM

## 2023-11-09 DIAGNOSIS — R7401 Elevation of levels of liver transaminase levels: Secondary | ICD-10-CM | POA: Diagnosis present

## 2023-11-09 DIAGNOSIS — Z7189 Other specified counseling: Secondary | ICD-10-CM | POA: Diagnosis not present

## 2023-11-09 LAB — CBC
HCT: 29.1 % — ABNORMAL LOW (ref 36.0–46.0)
Hemoglobin: 9.7 g/dL — ABNORMAL LOW (ref 12.0–15.0)
MCH: 28.9 pg (ref 26.0–34.0)
MCHC: 33.3 g/dL (ref 30.0–36.0)
MCV: 86.6 fL (ref 80.0–100.0)
Platelets: 160 K/uL (ref 150–400)
RBC: 3.36 MIL/uL — ABNORMAL LOW (ref 3.87–5.11)
RDW: 17 % — ABNORMAL HIGH (ref 11.5–15.5)
WBC: 4.9 K/uL (ref 4.0–10.5)
nRBC: 0 % (ref 0.0–0.2)

## 2023-11-09 MED ORDER — ACETAMINOPHEN 325 MG PO TABS
325.0000 mg | ORAL_TABLET | ORAL | Status: DC | PRN
  Administered 2023-11-09 – 2023-11-24 (×21): 650 mg via ORAL
  Administered 2023-11-25: 325 mg via ORAL
  Administered 2023-11-25 – 2023-12-07 (×8): 650 mg via ORAL
  Filled 2023-11-09 (×32): qty 2

## 2023-11-09 MED ORDER — DIPHENHYDRAMINE HCL 25 MG PO CAPS: 25.0000 mg | ORAL_CAPSULE | Freq: Four times a day (QID) | ORAL | Status: DC | PRN

## 2023-11-09 MED ORDER — ANASTROZOLE 1 MG PO TABS
1.0000 mg | ORAL_TABLET | Freq: Every day | ORAL | Status: DC
  Administered 2023-11-10 – 2023-12-09 (×28): 1 mg via ORAL
  Filled 2023-11-09 (×34): qty 1

## 2023-11-09 MED ORDER — TRAZODONE HCL 50 MG PO TABS
25.0000 mg | ORAL_TABLET | Freq: Every evening | ORAL | Status: DC | PRN
  Administered 2023-11-11 – 2023-11-16 (×2): 50 mg via ORAL
  Filled 2023-11-09 (×4): qty 1

## 2023-11-09 MED ORDER — GUAIFENESIN-DM 100-10 MG/5ML PO SYRP
5.0000 mL | ORAL_SOLUTION | Freq: Four times a day (QID) | ORAL | Status: DC | PRN
  Administered 2023-11-10: 10 mL via ORAL
  Filled 2023-11-09: qty 10

## 2023-11-09 MED ORDER — GABAPENTIN 300 MG PO CAPS
300.0000 mg | ORAL_CAPSULE | Freq: Three times a day (TID) | ORAL | Status: DC
  Administered 2023-11-09 – 2023-12-02 (×65): 300 mg via ORAL
  Filled 2023-11-09 (×67): qty 1

## 2023-11-09 MED ORDER — ASPIRIN 81 MG PO TBEC
81.0000 mg | DELAYED_RELEASE_TABLET | Freq: Every day | ORAL | Status: DC
  Administered 2023-11-10 – 2023-12-09 (×29): 81 mg via ORAL
  Filled 2023-11-09 (×30): qty 1

## 2023-11-09 MED ORDER — SENNOSIDES-DOCUSATE SODIUM 8.6-50 MG PO TABS
1.0000 | ORAL_TABLET | Freq: Every evening | ORAL | Status: DC | PRN
  Administered 2023-11-13: 1 via ORAL
  Filled 2023-11-09 (×2): qty 1

## 2023-11-09 MED ORDER — DILTIAZEM HCL ER COATED BEADS 120 MG PO CP24
120.0000 mg | ORAL_CAPSULE | Freq: Every day | ORAL | Status: DC
  Administered 2023-11-10 – 2023-12-09 (×27): 120 mg via ORAL
  Filled 2023-11-09 (×32): qty 1

## 2023-11-09 MED ORDER — APIXABAN 2.5 MG PO TABS
2.5000 mg | ORAL_TABLET | Freq: Two times a day (BID) | ORAL | Status: DC
  Administered 2023-11-09 – 2023-12-01 (×44): 2.5 mg via ORAL
  Filled 2023-11-09 (×44): qty 1

## 2023-11-09 MED ORDER — PROCHLORPERAZINE MALEATE 5 MG PO TABS: 5.0000 mg | ORAL_TABLET | Freq: Four times a day (QID) | ORAL | Status: DC | PRN

## 2023-11-09 MED ORDER — HYDROXYCHLOROQUINE SULFATE 200 MG PO TABS
200.0000 mg | ORAL_TABLET | Freq: Every day | ORAL | Status: DC
  Administered 2023-11-10 – 2023-12-09 (×28): 200 mg via ORAL
  Filled 2023-11-09 (×32): qty 1

## 2023-11-09 MED ORDER — PROCHLORPERAZINE EDISYLATE 10 MG/2ML IJ SOLN: 5.0000 mg | Freq: Four times a day (QID) | INTRAMUSCULAR | Status: DC | PRN

## 2023-11-09 MED ORDER — LEVOTHYROXINE SODIUM 100 MCG PO TABS
100.0000 ug | ORAL_TABLET | Freq: Every day | ORAL | Status: DC
  Administered 2023-11-10 – 2023-12-09 (×30): 100 ug via ORAL
  Filled 2023-11-09 (×30): qty 1

## 2023-11-09 MED ORDER — ALBUTEROL SULFATE (2.5 MG/3ML) 0.083% IN NEBU
2.5000 mg | INHALATION_SOLUTION | Freq: Four times a day (QID) | RESPIRATORY_TRACT | Status: AC | PRN
Start: 2023-11-09 — End: ?

## 2023-11-09 MED ORDER — FLEET ENEMA RE ENEM
1.0000 | ENEMA | Freq: Once | RECTAL | Status: AC | PRN
  Administered 2023-11-15: 1 via RECTAL
  Filled 2023-11-09: qty 1

## 2023-11-09 MED ORDER — PROCHLORPERAZINE 25 MG RE SUPP: 12.5000 mg | Freq: Four times a day (QID) | RECTAL | Status: DC | PRN

## 2023-11-09 MED ORDER — MAGNESIUM HYDROXIDE 400 MG/5ML PO SUSP
15.0000 mL | Freq: Once | ORAL | Status: AC
  Administered 2023-11-09: 15 mL via ORAL
  Filled 2023-11-09: qty 30

## 2023-11-09 MED ORDER — BISACODYL 10 MG RE SUPP
10.0000 mg | Freq: Every day | RECTAL | Status: DC | PRN
  Administered 2023-11-15: 10 mg via RECTAL
  Filled 2023-11-09: qty 1

## 2023-11-09 MED ORDER — ALUM & MAG HYDROXIDE-SIMETH 200-200-20 MG/5ML PO SUSP
30.0000 mL | ORAL | Status: DC | PRN
  Administered 2023-11-16: 30 mL via ORAL
  Filled 2023-11-09: qty 30

## 2023-11-09 MED ORDER — ATORVASTATIN CALCIUM 40 MG PO TABS
40.0000 mg | ORAL_TABLET | Freq: Every day | ORAL | Status: DC
  Administered 2023-11-10 – 2023-12-09 (×29): 40 mg via ORAL
  Filled 2023-11-09 (×30): qty 1

## 2023-11-09 MED ORDER — MAGNESIUM HYDROXIDE 400 MG/5ML PO SUSP
15.0000 mL | Freq: Every day | ORAL | Status: DC | PRN
  Administered 2023-11-15: 15 mL via ORAL
  Filled 2023-11-09 (×2): qty 30

## 2023-11-09 MED ORDER — POLYETHYLENE GLYCOL 3350 17 G PO PACK
17.0000 g | PACK | Freq: Every day | ORAL | Status: DC
  Administered 2023-11-10 – 2023-12-09 (×26): 17 g via ORAL
  Filled 2023-11-09 (×27): qty 1

## 2023-11-09 MED ORDER — APIXABAN 2.5 MG PO TABS: 2.5000 mg | ORAL_TABLET | Freq: Two times a day (BID) | ORAL | 3 refills | Status: DC

## 2023-11-09 MED ORDER — ENSURE PLUS HIGH PROTEIN PO LIQD
237.0000 mL | Freq: Two times a day (BID) | ORAL | Status: DC
  Administered 2023-11-10 – 2023-11-12 (×6): 237 mL via ORAL

## 2023-11-09 NOTE — TOC Transition Note (Signed)
 Transition of Care [facility]) - Discharge Note   Patient Details  Name: [name] MRN: [id number] Date of Birth: 10/04/1940  [facility] CM/SW Contact:  Andrez JULIANNA George, RN Phone Number: 11/09/2023, 10:24 AM   Clinical Narrative:     Pt is discharging to CIR today. No further needs per IP Care management.   Final next level of care: IP Rehab Facility Barriers to Discharge: No Barriers Identified   Patient Goals and CMS Choice   CMS Medicare.gov Compare Post Acute Care list provided to:: Patient Choice offered to / list presented to : Patient      Discharge Placement                       Discharge Plan and Services Additional resources added to the After Visit Summary for     Discharge Planning Services: CM Consult Post Acute Care Choice: IP Rehab                               Social Drivers of Health (SDOH) Interventions SDOH Screenings   Food Insecurity: Food Insecurity Present (11/07/2023)  Housing: High Risk (11/07/2023)  Transportation Needs: No Transportation Needs (11/07/2023)  Utilities: Not At Risk (11/07/2023)  Depression (PHQ2-9): Low Risk  (08/20/2023)  Social Connections: Moderately Integrated (11/07/2023)  Tobacco Use: Unknown (10/30/2023)  Recent Concern: Tobacco Use - High Risk (09/28/2023)     Readmission Risk Interventions     No data to display

## 2023-11-09 NOTE — Progress Notes (Signed)
 Kathryn Bouchard, MD  Physician Physical Medicine and Rehabilitation   PMR Pre-admission    Signed   Date of Service: 11/08/2023  2:58 PM  Related encounter: ED to Hosp-Admission (Discharged) from 11/06/2023 in [place] [facility]   Signed     Expand All Collapse All  Show:Clear all [x] Written[x] Templated[x] Copied  Added by: [x] Alison Heron MATSU, RN[x] Conetta, Kristyn H[x] Lovorn, Megan, MD  [] Hover for details PMR Admission Coordinator Pre-Admission Assessment   Patient: [name] is an 83 y.o., female MRN: [id number] DOB: 1940-07-18 Height: 5' 7 (170.2 cm) Weight: 54.4 kg                                                                                                                                                  Insurance Information HMO: yes    PPO:      PCP:      IPA:      80/20:      OTHER:  PRIMARY: [employer] Dual Complete     Policy#: [id number], Medicare [id number]      Subscriber: patient CM Name: [place]      Phone#: [phone] option 3     Fax#: [phone] Pre-Cert#: [id number]  auth for CIR from [facility] with [facility] medicare for admit 10/13 with next review date 10/20.  Updates due to general fax at fax listed above.     Benefits:  Phone #: [email or web address]      Eff. Date: 09/27/2023-still active     Deduct: $257 ($257 met)      Out of Pocket Max: $9,350 [id number] met)        CIR: $1,565 copay/admission      SNF: 20 full days Outpatient: 80% coverage      Home Health: 100% coverage       DME: 80% coverage      Providers: patient choice SECONDARY: Medicaid of [place]      Policy#: [id number]      Phone#: [phone]   Financial Counselor:       Phone#:    The "Data Collection Information Summary" for patients in Inpatient Rehabilitation Facilities with attached "Privacy Act Statement-Health Care Records" was provided and verbally reviewed with: Patient and Family   Emergency Contact Information Contact Information       Name  Relation Home Work Mobile    [place] Daughter [phone]   [phone]         Other Contacts   None on File      Current Medical History  Patient Admitting Diagnosis: CVA   History of Present Illness: [name] is a 83 y.o. 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  CT of the head revealed large hypodensity on the right concerning for large territory infarct.  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  Stroke consistent with embolic.  Patient apparently missed Eliquis  doses. Echo shows EF 60%, increased RVSP at 36.9 mmHg, mild MR, moderate AR. Continue aspirin , statin.  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 S/p radiation, on anastrozole .She is at risk for strokes due to her cancer history. Therapies are recommending intensive rehab.    Complete NIHSS TOTAL: 9 Glasgow Coma Scale Score: 15   Patient's medical record from [facility] has been reviewed by the rehabilitation admission coordinator and physician.   Past Medical History      Past Medical History:  Diagnosis Date   Acute bronchitis 12/17/2021   Acute cystitis 02/20/2019   Arthritis     Atrial flutter (HCC)     Breast cancer (HCC)      left  2009 and 2025   Diabetes mellitus without complication (HCC)     Dyspnea      mostly with exertion   GERD (gastroesophageal reflux disease)     History of radiation therapy      Left Lung-08/09/23-10/01/23- Dr. 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 No   Family History  family history includes Asthma in her brother; Breast cancer (age of onset: 44) in her daughter; Cancer in her daughter and father; Prostate cancer (age of onset: 80 - 24) in her father.   Current Medications   Current Medications    Current Facility-Administered Medications:    acetaminophen  (TYLENOL ) tablet 650 mg, 650 mg, Oral, Q4H PRN, 650 mg at 11/09/23 0852 **OR** acetaminophen  (TYLENOL ) 160 MG/5ML solution 650 mg, 650 mg, Per Tube, Q4H PRN **OR** acetaminophen  (TYLENOL ) suppository 650 mg, 650 mg, Rectal, Q4H PRN, Smith, Rondell A, MD   albuterol  (PROVENTIL ) (2.5 MG/3ML) 0.083% nebulizer solution 2.5 mg, 2.5 mg, Nebulization, Q6H PRN, Claudene, Rondell A, MD   anastrozole  (ARIMIDEX ) tablet 1 mg, 1 mg, Oral, Daily, Smith, Rondell A, MD, 1 mg at 11/09/23 9146   apixaban  (ELIQUIS ) tablet 2.5 mg, 2.5 mg, Oral, BID, Williamson, Erin R, RPH, 2.5 mg at 11/09/23 9146   aspirin  EC tablet 81 mg, 81 mg, Oral, Daily, Smith, Rondell A, MD, 81 mg at 11/09/23 0853   atorvastatin  (LIPITOR) tablet 40 mg, 40 mg, Oral, Daily, Smith, Rondell A, MD, 40 mg at 11/09/23 9146   diltiazem  (CARDIZEM  CD) 24 hr capsule 120 mg, 120 mg, Oral, Daily, Sreeram, Narendranath, MD, 120 mg at 11/09/23 0853   feeding supplement (ENSURE PLUS HIGH PROTEIN) liquid 237 mL, 237 mL, Oral, BID BM, Sreeram, Narendranath, MD, 237 mL at 11/09/23 0854   gabapentin  (NEURONTIN ) capsule 300 mg, 300 mg, Oral, TID, Smith, Rondell A, MD, 300 mg at 11/09/23 9146   hydrALAZINE  (APRESOLINE ) injection 10 mg, 10 mg, Intravenous, Q4H PRN, Claudene, Rondell A, MD   hydroxychloroquine  (PLAQUENIL ) tablet 200 mg, 200 mg, Oral, Daily, Smith, Rondell A, MD, 200 mg at 11/09/23 9146   levothyroxine  (SYNTHROID ) tablet 100 mcg, 100 mcg, Oral, Daily, Claudene, Rondell A, MD, 100 mcg at 11/09/23  9370   senna-docusate (Senokot-S) tablet 1 tablet, 1 tablet,  Oral, QHS PRN, Claudene Maximino LABOR, MD     Patients Current Diet:  Diet Order                  Diet heart healthy/carb modified Room service appropriate? Yes; Fluid consistency: Thin  Diet effective now                       Precautions / Restrictions Precautions Precautions: Fall Precaution/Restrictions Comments: SBP <180 Restrictions Weight Bearing Restrictions Per Provider Order: No    Has the patient had 2 or more falls or a fall with injury in the past year?No   Prior Activity Level Limited Community (1-2x/wk): used RW over the past week   Prior Functional Level Prior Function Prior Level of Function : Independent/Modified Independent Mobility Comments: Reports using RW since NSTEMI ~1 week ago ADLs Comments: Family drives, has medications pre-dosed in a packet   Self Care: Did the patient need help bathing, dressing, using the toilet or eating?  Independent   Indoor Mobility: Did the patient need assistance with walking from room to room (with or without device)? Independent   Stairs: Did the patient need assistance with internal or external stairs (with or without device)? Independent   Functional Cognition: Did the patient need help planning regular tasks such as shopping or remembering to take medications? Needed some help   Patient Information Are you of Hispanic, Latino/a,or Spanish origin?: A. No, not of Hispanic, Latino/a, or Spanish origin What is your race?: B. Black or African American Do you need or want an interpreter to communicate with a doctor or health care staff?: 0. No   Patient's Response To:  Health Literacy and Transportation Is the patient able to respond to health literacy and transportation needs?: Yes Health Literacy - How often do you need to have someone help you when you read instructions, pamphlets, or other written material from your doctor or pharmacy?: Never In the past 12 months, has lack of transportation kept you from medical  appointments or from getting medications?: No In the past 12 months, has lack of transportation kept you from meetings, work, or from getting things needed for daily living?: No   Journalist, newspaper / Equipment Home Equipment: Cane - single point, Tub bench, BSC/3in1, Rollator (4 wheels)   Prior Device Use: Indicate devices/aids used by the patient prior to current illness, exacerbation or injury? Walker   Current Functional Level Cognition   Orientation Level: Oriented to person    Extremity Assessment (includes Sensation/Coordination)   Upper Extremity Assessment: Right hand dominant, RUE deficits/detail RUE Deficits / Details: shoulder flexion ~1/5, elbow flexion/extension ~0/5, grip strength ~2/5; intact sensation per pt report RUE Sensation: WNL RUE Coordination: decreased fine motor, decreased gross motor  Lower Extremity Assessment: Defer to PT evaluation RLE Deficits / Details: Pt with decreased AROM likely secondary to weakness. Performed PROM, WFL, able to achieve appropraite end feels. Pt reported decreased sensation. She was unable to complete heel-to-shin and has significantly slowler rapid alternating movements when attempting to tap feet. RLE Sensation: decreased light touch, decreased proprioception RLE Coordination: decreased gross motor     ADLs   Overall ADL's : Needs assistance/impaired Eating/Feeding: Minimal assistance, Bed level Eating/Feeding Details (indicate cue type and reason): using L hand (non-dominant) to feed self, needs A for container/beverage mgmt Upper Body Dressing : Maximal assistance, Sitting Upper Body Dressing Details (indicate cue type and reason):  adjusting gown Lower Body Dressing: Total assistance, Bed level Lower Body Dressing Details (indicate cue type and reason): donning B socks Toileting- Clothing Manipulation and Hygiene: Total assistance, Bed level Toileting - Clothing Manipulation Details (indicate cue type and reason): purewick      Mobility   Overal bed mobility: Needs Assistance Bed Mobility: Supine to Sit, Sit to Supine Supine to sit: Mod assist, HOB elevated, Used rails Sit to supine: Max assist, HOB elevated General bed mobility comments: Exited to the R side, cues for RUE mgmt and assist to bring RLE off EOB. Inc A for sit>supine 2/2 fatigue.     Transfers   Overall transfer level: Needs assistance Equipment used: None Transfers: Sit to/from Stand Sit to Stand: From elevated surface, Mod assist General transfer comment: did not assess due to reduced static sitting balance at EOB; anticipate inc A needed 2/2 RHB paresis.     Ambulation / Gait / Stairs / Clinical biochemist / Balance Dynamic Sitting Balance Sitting balance - Comments: seated EOB, needing mod A-bouts of CGA to sustain midline. Cued for WB through R hand on lap while seated EOB. Balance Overall balance assessment: Needs assistance Sitting-balance support: Single extremity supported, Feet supported Sitting balance-Leahy Scale: Poor Sitting balance - Comments: seated EOB, needing mod A-bouts of CGA to sustain midline. Cued for WB through R hand on lap while seated EOB. Postural control: Right lateral lean, Posterior lean Standing balance support: Single extremity supported, During functional activity Standing balance-Leahy Scale: Poor Standing balance comment: Pt dependent on external support of therapist and mod-maxA to maintain brief static stance.     Special considerations/ Life events          Previous Home Environment  Living Arrangements: Children  Lives With: Family Available Help at Discharge: Family, Available 24 hours/day Type of Home: House Home Layout: One level Home Access: Stairs to enter Entrance Stairs-Rails: Right, Left, Can reach both Entrance Stairs-Number of Steps: 1 Bathroom Shower/Tub: Armed forces operational officer Accessibility: Yes How Accessible: Accessible via  walker Home Care Services: No Additional Comments: enjoys shopping at [employer]   Discharge Living Setting Plans for Discharge Living Setting: Patient's home, Lives with (comment) (daughter) Type of Home at Discharge: House Discharge Home Layout: One level Discharge Home Access: Stairs to enter Entrance Stairs-Rails: Right, Can reach both, Left Entrance Stairs-Number of Steps: 1 Discharge Bathroom Shower/Tub: Tub/shower unit Discharge Bathroom Toilet: Standard Discharge Bathroom Accessibility: Yes How Accessible: Accessible via walker Does the patient have any problems obtaining your medications?: No   Social/Family/Support Systems Anticipated Caregiver: [name], daughter Anticipated Caregiver's Contact Information: [phone] Caregiver Availability: 24/7 Discharge Plan Discussed with Primary Caregiver: Yes Is Caregiver In Agreement with Plan?: Yes Does Caregiver/Family have Issues with Lodging/Transportation while Pt is in Rehab?: No     Goals Patient/Family Goal for Rehab: Mod I to superivsion with PT, OT and SLP Expected length of stay: 12-15 days Pt/Family Agrees to Admission and willing to participate: Yes Program Orientation Provided & Reviewed with Pt/Caregiver Including Roles  & Responsibilities: Yes  Barriers to Discharge: Insurance for SNF coverage     Decrease burden of Care through IP rehab admission: n/a     Possible need for SNF placement upon discharge:not anticipated     Patient Condition: This patient's medical and functional status has not changed since the consult dated 11/08/23 in which the Rehabilitation Physician determined and documented that the patient was appropriate for intensive rehabilitative care  in an inpatient rehabilitation facility. Case has been discussed with Dr. Lovorn and patient is appropriate for inpatient rehabilitation. Will admit to inpatient rehab today.    Preadmission Screen Completed By:  Kristyn Conetta, 11/09/2023 10:18 AM with  updates by Heron Leavell RN MSN on 11/09/23 at 1023 ______________________________________________________________________   Discussed status with Dr. Cornelio on 11/09/23 at 1023 and received approval for admission today.   Admission Coordinator:  Kristyn Conetta, time 1023 Date 11/09/23    Assessment/Plan: Diagnosis: B/L cerebellar infarcts and watershed strokes Does the need for close, 24 hr/day Medical supervision in concert with the patient's rehab needs make it unreasonable for this patient to be served in a less intensive setting? Yes Co-Morbidities requiring supervision/potential complications: DM, Stage 3A lung CA on radiation, s/p breast cancer 06/2023- s/p mastectomy; HTN, HLD, CAD s/p recent cath for NSTEMI  Due to bladder management, bowel management, safety, skin/wound care, disease management, medication administration, pain management, and patient education, does the patient require 24 hr/day rehab nursing? Yes Does the patient require coordinated care of a physician, rehab nurse, PT, OT, and SLP to address physical and functional deficits in the context of the above medical diagnosis(es)? Yes Addressing deficits in the following areas: balance, endurance, locomotion, strength, transferring, bowel/bladder control, bathing, dressing, feeding, grooming, toileting, cognition, speech, and swallowing Can the patient actively participate in an intensive therapy program of at least 3 hrs of therapy 5 days a week? Yes The potential for patient to make measurable gains while on inpatient rehab is good Anticipated functional outcomes upon discharge from inpatient rehab: modified independent and supervision PT, modified independent and supervision OT, modified independent and supervision SLP Estimated rehab length of stay to reach the above functional goals is: 12-15 days Anticipated discharge destination: Home 10. Overall Rehab/Functional Prognosis: good     MD Signature:             Revision History

## 2023-11-09 NOTE — Progress Notes (Signed)
 STROKE TEAM PROGRESS NOTE    SIGNIFICANT HOSPITAL EVENTS  10/11: Presented to ED due to transient dysarthria.  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  Patient undergoing radiation for adenocarcinoma of lung.  Recent cardiac cath with discharge from hospital 10/8.  Patient daughter endorsed missing evening doses of Eliquis  10/8 and 10/9.  INTERIM HISTORY/SUBJECTIVE No acute event overnight. Pt neuro unchanged. Oncology does not feel pt stroke is related to cancer and cardiology is OK with pt on ASA instead of plavix.    OBJECTIVE  CBC    Component Value Date/Time   WBC 4.9 11/09/2023 0115   RBC 3.36 (L) 11/09/2023 0115   HGB 9.7 (L) 11/09/2023 0115   HGB 11.4 (L) 08/31/2023 1349   HGB 11.7 01/07/2023 1439   HCT 29.1 (L) 11/09/2023 0115   HCT 36.4 01/07/2023 1439   PLT 160 11/09/2023 0115   PLT 203 08/31/2023 1349   PLT 257 01/07/2023 1439   MCV 86.6 11/09/2023 0115   MCV 89 01/07/2023 1439   MCH 28.9 11/09/2023 0115   MCHC 33.3 11/09/2023 0115   RDW 17.0 (H) 11/09/2023 0115   RDW 13.9 01/07/2023 1439   LYMPHSABS 0.3 (L) 11/06/2023 1258   LYMPHSABS 1.7 04/25/2022 1223   MONOABS 0.5 11/06/2023 1258   EOSABS 0.1 11/06/2023 1258   EOSABS 0.1 04/25/2022 1223   BASOSABS 0.0 11/06/2023 1258   BASOSABS 0.0 04/25/2022 1223    BMET    Component Value Date/Time   NA 133 (L) 11/06/2023 1258   NA 138 01/07/2023 1439   K 3.8 11/06/2023 1258   CL 99 11/06/2023 1258   CO2 24 11/06/2023 1258   GLUCOSE 95 11/06/2023 1258   BUN 10 11/06/2023 1258   BUN 12 01/07/2023 1439   CREATININE 1.01 (H) 11/06/2023 1258   CREATININE 1.10 (H) 08/31/2023 1349   CALCIUM  9.4 11/06/2023 1258   EGFR 48 (L) 01/07/2023 1439   GFRNONAA 56 (L) 11/06/2023 1258   GFRNONAA 50 (L) 08/31/2023 1349    IMAGING past 24 hours No results found.   Vitals:   11/09/23 0353 11/09/23 0741 11/09/23 0852 11/09/23 1101  BP: 110/60 (!) 132/58 (!) 132/58 (!)  123/56  Pulse: 80 72  76  Resp: 18 16  16   Temp: 98.4 F (36.9 C) 98 F (36.7 C)  98 F (36.7 C)  TempSrc: Oral   Oral  SpO2: 100% 100%  100%  Weight:      Height:         PHYSICAL EXAM General:  Alert, well-nourished, well-developed patient in no acute distress Psych:  Mood and affect appropriate for situation CV: Regular rate and rhythm on monitor Respiratory:  Regular, unlabored respirations on room air GI: Abdomen soft and nontender   NEURO:  Mental Status: AA&O, additional time needed for subsequent questions.  Speech/Language: Mild dysarthria and aphasia present, paucity of speech.   Cranial Nerves:  II: PERRL. ?decreased  III, IV, VI: EOMI. Eyelids elevate symmetrically.  V: Sensation is intact to light touch and symmetrical to face.  VII: Face is symmetrical resting and smiling VIII: hearing intact to voice. IX, X: Palate elevates symmetrically. Phonation is normal.  KP:Dynloizm shrug 5/5. XII: tongue is midline without fasciculations. Motor:  RUE: 4-/5, mild drift RLE: 4-/5, moderate drift Tone: is normal and bulk is normal Sensation- Intact to light touch bilaterally. Extinction absent to light touch to DSS.   Coordination: FTN intact bilaterally, slower on right.   Gait- deferred  Most Recent NIH: 9    ASSESSMENT/PLAN  [name] is a 83 y.o. female with history of  A-fib on Eliquis , CAD, recently diagnosed adenocarcinoma of the lung undergoing radiation treatment, history of breast cancer diagnosed last year who was recently discharged from the hospital after being admitted for NSTEMI.  She presents with slurred speech and right-sided weakness along with inconsistent responses to visual field testing in the right Hemi visual field. NIH on Admission: 5.  Stroke:  bilateral embolic shower, embolic pattern, etiology:  Afib and missed Eliquis  doses vs from NSTEMI with cardiac cath CT Head without contrast negative for a large hypodensity concerning  for a large territory infarct or hyperdensity concerning for an ICH MRI Brain Multiple acute infarcts in a bilateral watershed distribution involving the convexities, inferior right frontal lobe, right caudate head, posterior parietal and occipital lobes, right lateral pons, and posterior cerebellum. Findings suggest a central embolic source MRA head and neck No LVO, no significant stenosis. 2D Echo: EF 60%, mild MR, moderate AR LDL 74 HgbA1c 6.0 VTE prophylaxis - heparin  IV Eliquis  5mg  BID (noted missed doses) and ASA 81 prior to admission, now on ASA 81 and eliquis . Continue on discharge. Therapy recommendations:  CIR Disposition:  pending  Paroxysmal Atrial Flutter/Afib Home Meds: eliquis , pt admitted that she missed 2-3 days of eliquis  Continue telemetry monitoring Was on anticoagulation with Heparin  IV after admission Switched heparin  IV to eliquis  yesterday.  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 Francyne note 10/7, recommended Eliquis  5 mg twice daily and also adding Plavix daily for 1 month only.  However patient was discharged on Eliquis  only.  Per notes, patient called cardiology office.  Dr. Ladona stated patient should take aspirin  81 mg for 30 days and then stop. Curbside cardiology and will continue ASA 81.   Hypertension Home meds:  Hyzaar daily, cardizem  daily, aldactone  daily Stable Long term BP goal: normotensive  Hyperlipidemia Home meds:  lipitor 40mg , resumed in hospital LDL 74, goal < 70 On home Lipitor 40mg  Continue statin at discharge  Diabetes type II Controlled Home meds:  glimepride HgbA1c 6.0, goal < 7.0 CBGs SSI Recommend close follow-up with PCP   Other Stroke Risk Factors Advanced Age  Other medical issues R breast cancer post-mastectomy, on anastrozole   Stage 3a lung Adenocarcinoma left lower lobe,  on radiation  Hospital day # 3  Neurology will sign off. Please call with questions. Pt will follow up with stroke clinic NP at [facility] in about 4 weeks. Thanks for the consult.   Ary Cummins, MD PhD Stroke Neurology 11/09/2023 3:01 PM    To contact Stroke Continuity provider, please refer to [email or web address]. After hours, contact General Neurology

## 2023-11-09 NOTE — Progress Notes (Signed)
  Inpatient Rehabilitation Admissions Coordinator   I have insurance approval and CIR bed to admit her to today. I met with patient at bedside and she is aware and in agreement. I contacted her daughter by phone and she is aware and in agreement. Acute team and TOC made aware. I will make the arrangements.  Heron Leavell, RN, MSN Rehab Admissions Coordinator [phone] 11/09/2023 10:13 AM

## 2023-11-09 NOTE — Progress Notes (Signed)
 Patient has arrived to the unit bed from 3 west. A/O x 3. No pain or distress noted at this time. VSS. Oriented patient to the room and staff. Education provided regarding safety precaution and patient verbalize understanding.   Skin intact.  Daughter is at the bedside.

## 2023-11-09 NOTE — H&P (Signed)
 Physical Medicine and Rehabilitation Admission H&P    Chief Complaint  Patient presents with   Functional deficits due to CVA  : HPI: [name] is a 83 year old female with PMHx of A.fib on Eliquis , recent dx of adenocarcinoma of lung undergoing radiation treatment, hx breast cancer, recent admission for NSTEMI with right heart cath (10/4-10/7) presented to [facility] on November 06, 2023, with symptoms of altered mental status, slurred speech, and right-sided weakness. Initial CT imaging of the head indicated a large hypodensity on the right side, suggestive of a large territory infarct. 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 There were also bilateral embolic watershed infarcts and bilateral cerebellum infarcts identified. Neurology assessed the stroke to be potentially related to atrial fibrillation with missed doses of Eliquis  or the recent NSTEMI with cardiac catheterization. An MRA of the head and neck was negative for large vessel occlusion and showed no significant stenosis. Prior to her hospital admission, [name] was limited in the community, using a rollator walker more frequently over the past week and requiring assistance with mobility and functional cognition. Currently, she needs minimal to maximal assistance with activities of daily living (ADLs) and general mobility, and she transfers using moderate assistance. The patient resides with her family in a one-level home with a single step to enter. Therapy evaluations completed due to patient decreased functional mobility was admitted for a comprehensive rehab program.    Review of Systems  Constitutional:  Positive for malaise/fatigue.  HENT: Negative.    Eyes: Negative.   Respiratory: Negative.    Cardiovascular:   Negative for chest pain and claudication.  Gastrointestinal:  Positive for constipation. Negative for nausea and vomiting.  Genitourinary: Negative.  Negative for dysuria and urgency.  Musculoskeletal: Negative.   Skin: Negative.   Neurological:  Positive for sensory change, speech change, focal weakness and weakness. Negative for tingling.  Endo/Heme/Allergies: Negative.   Psychiatric/Behavioral:  Positive for memory loss.   All other systems reviewed and are negative.  Past Medical History:  Diagnosis Date   Acute bronchitis 12/17/2021   Acute cystitis 02/20/2019   Arthritis    Atrial flutter (HCC)    Breast cancer (HCC)    left  2009 and 2025   Diabetes mellitus without complication (HCC)    Dyspnea    mostly with exertion   GERD (gastroesophageal reflux disease)    History of radiation therapy    Left Lung-08/09/23-10/01/23- Dr. 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[facility])    Lupus    Pneumonia 07/31/2021   Past Surgical History:  Procedure Laterality Date   BREAST BIOPSY Left 01/15/2023   US  LT BREAST BX W LOC DEV 1ST LESION IMG BX SPEC US  GUIDE 01/15/2023 [facility] MAMMOGRAPHY   BREAST LUMPECTOMY Left 2009   BRONCHIAL BIOPSY  06/29/2023   Procedure: BRONCHOSCOPY, WITH BIOPSY;  Surgeon: [name], MD;  Location: [facility] ENDOSCOPY;  Service: Pulmonary;;   BRONCHIAL BRUSHINGS  06/29/2023   Procedure: BRONCHOSCOPY, WITH BRUSH BIOPSY;  Surgeon: [name], MD;  Location: [facility] ENDOSCOPY;  Service: Pulmonary;;   BRONCHIAL NEEDLE ASPIRATION BIOPSY  06/29/2023   Procedure: BRONCHOSCOPY, WITH NEEDLE ASPIRATION BIOPSY;  Surgeon: [name], MD;  Location: [facility] ENDOSCOPY;  Service: Pulmonary;;   IR 3D INDEPENDENT WKST  05/29/2021   IR ANGIO INTRA EXTRACRAN SEL COM CAROTID INNOMINATE BILAT MOD SED  05/29/2021   IR ANGIO VERTEBRAL SEL VERTEBRAL BILAT MOD SED  05/29/2021   LEFT HEART CATH AND CORONARY ANGIOGRAPHY N/A 11/01/2023   Procedure: LEFT  HEART CATH AND CORONARY ANGIOGRAPHY;  Surgeon: Wonda Sharper, MD;  Location: [facility] INVASIVE CV LAB;  Service: Cardiovascular;  Laterality: N/A;   SIMPLE MASTECTOMY WITH AXILLARY SENTINEL NODE BIOPSY Left 03/31/2023   Procedure: LEFT MASTECTOMY;  Surgeon: Vernetta Berg, MD;  Location: [facility] OR;  Service: General;  Laterality: Left;  LMA PEC BLOCK   VIDEO BRONCHOSCOPY WITH ENDOBRONCHIAL NAVIGATION Left 06/29/2023   Procedure: VIDEO BRONCHOSCOPY WITH ENDOBRONCHIAL NAVIGATION;  Surgeon: [name], MD;  Location: [facility] ENDOSCOPY;  Service: Pulmonary;  Laterality: Left;   VIDEO BRONCHOSCOPY WITH ENDOBRONCHIAL ULTRASOUND Bilateral 06/29/2023   Procedure: BRONCHOSCOPY, WITH EBUS;  Surgeon: [name], MD;  Location: [facility] ENDOSCOPY;  Service: Pulmonary;  Laterality: Bilateral;   Family History  Problem Relation Age of Onset   Cancer Father        prostate   Prostate cancer Father 98 - 71       metaststic   Asthma Brother    Cancer Daughter        breast   Breast cancer Daughter 53   Social History:  reports that she has never smoked. She has never been exposed to tobacco smoke. She does not have any smokeless tobacco history on file. She reports that she does not drink alcohol  and does not use drugs. Allergies:  Allergies  Allergen Reactions   Ciprofloxacin Itching, Swelling and Other (See Comments)    Possibly causing tremors?   Crestor  [Rosuvastatin ] Other (See Comments)    Myalgia and back pain   Lisinopril  Cough   Ofev  [Nintedanib] Nausea Only   Medications Prior to Admission  Medication Sig Dispense Refill   albuterol  (ACCUNEB ) 0.63 MG/3ML nebulizer solution Take 3 mLs (0.63 mg total) by nebulization every 6 (six) hours as needed for wheezing. 75 mL 12   albuterol  (VENTOLIN  HFA) 108 (90 Base) MCG/ACT inhaler Inhale 2 puffs into the lungs every 6 (six) hours as needed for wheezing or shortness of breath. 8.5 g 2   anastrozole  (ARIMIDEX ) 1 MG tablet Take 1 tablet (1 mg total) by mouth  daily. 90 tablet 3   apixaban  (ELIQUIS ) 5 MG TABS tablet Take 1 tablet (5 mg total) by mouth 2 (two) times daily. Okay to restart on 06/30/2023     atorvastatin  (LIPITOR) 40 MG tablet Take 1 tablet (40 mg total) by mouth daily. 30 tablet 0   diclofenac  Sodium (VOLTAREN ) 1 % GEL Apply 1 application. topically 4 (four) times daily as needed (pain).     diltiazem  (CARDIZEM  CD) 120 MG 24 hr capsule Take 1 capsule (120 mg total) by mouth daily. 90 capsule 3   gabapentin  (NEURONTIN ) 300 MG capsule Take 300 mg by mouth 3 (three) times daily.     glimepiride  (AMARYL ) 1 MG tablet Take 1 mg by mouth daily with breakfast.     hydroxychloroquine  (PLAQUENIL ) 200 MG tablet Take 200 mg by mouth daily.     Ipratropium-Albuterol  (COMBIVENT RESPIMAT) 20-100 MCG/ACT AERS respimat Inhale 1 puff into the lungs every 6 (six) hours as needed for shortness of breath.     levothyroxine  (SYNTHROID ) 100 MCG tablet Take 100 mcg by mouth daily.     omeprazole (PRILOSEC) 20 MG capsule Take 20 mg by mouth daily as needed (Indigestion).  spironolactone  (ALDACTONE ) 25 MG tablet Take 0.5 tablets (12.5 mg total) by mouth daily. 45 tablet 3   sucralfate  (CARAFATE ) 1 g tablet Take 1 g by mouth 4 (four) times daily.     aspirin  EC 81 MG tablet Take 1 tablet (81 mg total) by mouth daily. Swallow whole.     losartan -hydrochlorothiazide  (HYZAAR) 50-12.5 MG tablet Take 1 tablet by mouth daily. (Patient not taking: Reported on 10/31/2023)        Home: Home Living Family/patient expects to be discharged to:: Inpatient rehab Living Arrangements: Children Available Help at Discharge: Family, Available 24 hours/day Type of Home: House Home Access: Stairs to enter [employer] of Steps: 1 Entrance Stairs-Rails: Right, Left, Can reach both Home Layout: One level Bathroom Shower/Tub: Engineer, manufacturing systems: Standard Bathroom Accessibility: Yes Home Equipment: Cane - single point, Tub bench, BSC/3in1, Rollator (4  wheels) Additional Comments: enjoys shopping at [employer] With: Family   Functional History: Prior Function Prior Level of Function : Independent/Modified Independent Mobility Comments: Reports using RW since NSTEMI ~1 week ago ADLs Comments: Family drives, has medications pre-dosed in a packet  Functional Status:  Mobility: Bed Mobility Overal bed mobility: Needs Assistance Bed Mobility: Supine to Sit, Sit to Supine Supine to sit: Mod assist, HOB elevated, Used rails Sit to supine: Max assist, HOB elevated General bed mobility comments: Exited to the R side, cues for RUE mgmt and assist to bring RLE off EOB. Inc A for sit>supine 2/2 fatigue. Transfers Overall transfer level: Needs assistance Equipment used: None Transfers: Sit to/from Stand Sit to Stand: From elevated surface, Mod assist General transfer comment: did not assess due to reduced static sitting balance at EOB; anticipate inc A needed 2/2 RHB paresis.      ADL: ADL Overall ADL's : Needs assistance/impaired Eating/Feeding: Minimal assistance, Bed level Eating/Feeding Details (indicate cue type and reason): using L hand (non-dominant) to feed self, needs A for container/beverage mgmt Upper Body Dressing : Maximal assistance, Sitting Upper Body Dressing Details (indicate cue type and reason): adjusting gown Lower Body Dressing: Total assistance, Bed level Lower Body Dressing Details (indicate cue type and reason): donning B socks Toileting- Clothing Manipulation and Hygiene: Total assistance, Bed level Toileting - Clothing Manipulation Details (indicate cue type and reason): purewick  Cognition: Cognition Orientation Level: Oriented to person, Oriented to place, Oriented to situation, Disoriented to time Cognition Arousal: Alert Behavior During Therapy: [facility] for tasks assessed/performed  Physical Exam: Blood pressure (!) 123/56, pulse 76, temperature 98 F (36.7 C), temperature source Oral, resp. rate 16,  height 5' 7 (1.702 m), weight 54.4 kg, SpO2 100%. Physical Exam Vitals and nursing note reviewed. Exam conducted with a chaperone present.  Constitutional:      Comments: Pt awake, alert, sitting up in bed; appears almost cachetic, daughter at bedside, NAD  HENT:     Head: Normocephalic and atraumatic.     Comments: R facial droop Tongue midline Facial sensation intact B/L    Right Ear: External ear normal.     Left Ear: External ear normal.     Nose: Nose normal. No congestion.     Mouth/Throat:     Mouth: Mucous membranes are dry.     Pharynx: Oropharynx is clear. No oropharyngeal exudate.  Eyes:     General:        Right eye: No discharge.        Left eye: No discharge.     Comments: Difficulty following commands to follow my finger-  hard for pt to look anywhere but straight ahead- sleepy  Cardiovascular:     Rate and Rhythm: Normal rate and regular rhythm.     Heart sounds: Normal heart sounds. No murmur heard.    No gallop.  Pulmonary:     Effort: Pulmonary effort is normal. No respiratory distress.     Breath sounds: Normal breath sounds. No wheezing, rhonchi or rales.     Comments: Decreased in bases slightly- not big breaths Abdominal:     General: There is distension.     Palpations: Abdomen is soft.  Musculoskeletal:     Comments: RUE- 0/5 even shoulder shrug LUE 4+/5 throughout RLE- HF 1/5; KE/KF 0/5; DF 0/5; PF 2-/5 LLE- 4+/5 throughout  Needed propping to sit upright by pillows  Skin:    General: Skin is warm and dry.     Comments: IV looks ok-  Mastectomy scars noted on R  Neurological:     Mental Status: She is alert.     Comments: Delayed responses- very slowed Naming intact, repetition is intact No increased tone/no clonus no hoffman's Decreased to light touch on RLE< but not RUE or face  Psychiatric:     Comments: Very flat     Results for orders placed or performed during the hospital encounter of 11/06/23 (from the past 48 hours)  Glucose,  capillary     Status: Abnormal   Collection Time: 11/07/23  3:33 PM  Result Value Ref Range   Glucose-Capillary 109 (H) 70 - 99 mg/dL    Comment: Glucose reference range applies only to samples taken after fasting for at least 8 hours.  APTT     Status: Abnormal   Collection Time: 11/07/23  6:26 PM  Result Value Ref Range   aPTT 84 (H) 24 - 36 seconds    Comment:        IF BASELINE aPTT IS ELEVATED, SUGGEST PATIENT RISK ASSESSMENT BE USED TO DETERMINE APPROPRIATE ANTICOAGULANT THERAPY. Performed at [facility], [address], [place], [place] [address]   Heparin  level (unfractionated)     Status: Abnormal   Collection Time: 11/07/23  6:26 PM  Result Value Ref Range   Heparin  Unfractionated >1.10 (H) 0.30 - 0.70 IU/mL    Comment: (NOTE) The clinical reportable range upper limit is being lowered to >1.10 to align with the FDA approved guidance for the current laboratory assay.  If heparin  results are below expected values, and patient dosage has  been confirmed, suggest follow up testing of antithrombin III levels. Performed at [facility], [address], [place], [place] [address]   CBC     Status: Abnormal   Collection Time: 11/08/23  2:06 AM  Result Value Ref Range   WBC 4.8 4.0 - 10.5 K/uL   RBC 3.44 (L) 3.87 - 5.11 MIL/uL   Hemoglobin 9.8 (L) 12.0 - 15.0 g/dL   HCT 70.4 (L) 63.9 - 53.9 %   MCV 85.8 80.0 - 100.0 fL   MCH 28.5 26.0 - 34.0 pg   MCHC 33.2 30.0 - 36.0 g/dL   RDW 83.2 (H) 88.4 - 84.4 %   Platelets 118 (L) 150 - 400 K/uL   nRBC 0.0 0.0 - 0.2 %    Comment: Performed at [facility], [address] [place], [place] [address]  Heparin  level (unfractionated)     Status: Abnormal   Collection Time: 11/08/23  2:06 AM  Result Value Ref Range   Heparin  Unfractionated >  1.10 (H) 0.30 - 0.70 IU/mL    Comment: (NOTE) The clinical reportable range upper limit is being lowered to >1.10 to align with the FDA approved guidance for the current  laboratory assay.  If heparin  results are below expected values, and patient dosage has  been confirmed, suggest follow up testing of antithrombin III levels. Performed at [facility], [address], [place], [place] [address]   APTT     Status: Abnormal   Collection Time: 11/08/23  9:44 AM  Result Value Ref Range   aPTT 79 (H) 24 - 36 seconds    Comment:        IF BASELINE aPTT IS ELEVATED, SUGGEST PATIENT RISK ASSESSMENT BE USED TO DETERMINE APPROPRIATE ANTICOAGULANT THERAPY. Performed at [facility], [address], [place], [place] [address]   CBC     Status: Abnormal   Collection Time: 11/09/23  1:15 AM  Result Value Ref Range   WBC 4.9 4.0 - 10.5 K/uL   RBC 3.36 (L) 3.87 - 5.11 MIL/uL   Hemoglobin 9.7 (L) 12.0 - 15.0 g/dL   HCT 70.8 (L) 63.9 - 53.9 %   MCV 86.6 80.0 - 100.0 fL   MCH 28.9 26.0 - 34.0 pg   MCHC 33.3 30.0 - 36.0 g/dL   RDW 82.9 (H) 88.4 - 84.4 %   Platelets 160 150 - 400 K/uL   nRBC 0.0 0.0 - 0.2 %    Comment: Performed at [facility], [address], [place], [place] [address]   No results found.    Blood pressure (!) 123/56, pulse 76, temperature 98 F (36.7 C), temperature source Oral, resp. rate 16, height 5' 7 (1.702 m), weight 54.4 kg, SpO2 100%.  Medical Problem List and Plan: 1. Functional deficits secondary to  bilateral embolic shower, embolic pattern, etiology:  Afib and missed Eliquis  doses vs from NSTEMI with cardiac cath.   -patient may  shower  -ELOS/Goals: 12-15 days min A-   Cannot sit upright without propping  Admit to CIR- is appropriate 2.  Antithrombotics: -DVT/anticoagulation:  Pharmaceutical: Eliquis  Transitioned from IV Heparin  to Eliquis  10/14  -antiplatelet therapy: Asprin 81 mg  3. Pain Management: Gabapentin  and Tylenol  as needed 4. Mood/Behavior/Sleep: LCSW to follow for evaluation and support when available.   -antipsychotic agents: N/A 5. Neuropsych/cognition: This patient is not quite  capable of making decisions on her own behalf. 6. Skin/Wound Care: routine pressure relief measures.  7. Fluids/Electrolytes/Nutrition: monitor intake and output. Follow up labs in a.m.   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 Continue aspirin  and statin.   9. CAD: 10/4-10/7 admitted for NSTEMI  10. HLD: Lipitor 40 mg  11. HTN: monitor blood pressures on  Hyzaar, Cardizem , and Aldactone   12. Paroxymal Atrial Flutter/Fib: continue Eliquis  2.5 bid 13. T2DM: HgbA1c 6.0, resumed home Amaryl   - monitor cbg AC/HS with SSI.  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  Hypothyroidism: Synthroid  17.  Chronic anemia: Stable, monitor CBC.  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 Constipation- will order Milk of Mg per pt request and Miralax  daily    Daphne LOISE Satterfield, NP 11/09/2023  I have personally performed a face to face diagnostic evaluation of this patient and formulated the key components of the plan.  Additionally, I have personally reviewed laboratory data, imaging studies, as well as  relevant notes and concur with the physician assistant's documentation above.   The patient's status has not changed from the original H&P.  Any changes in documentation from the acute care chart have been noted above.

## 2023-11-09 NOTE — Progress Notes (Signed)
 Inpatient Rehabilitation Admission Medication Review by a Pharmacist  A complete drug regimen review was completed for this patient to identify any potential clinically significant medication issues.  High Risk Drug Classes Is patient taking? Indication by Medication  Antipsychotic Yes Compazine - N/V  Anticoagulant Yes Apixaban  - AF  Antibiotic No   Opioid No   Antiplatelet Yes Asa - CVA   Hypoglycemics/insulin  No   Vasoactive Medication Yes Diltiazem  - AF  Chemotherapy No   Other Yes Trazodone  - sleep Albuterol  - short of breath Anastrozole  - breast CA Atorvastatin  - HLD Neurontin  - neuropathy Plaquenil  - SLE Synthroid  - hypothyroidism     Type of Medication Issue Identified Description of Issue Recommendation(s)  Drug Interaction(s) (clinically significant)     Duplicate Therapy     Allergy     No Medication Administration End Date     Incorrect Dose     Additional Drug Therapy Needed     Significant med changes from prior encounter (inform family/care partners about these prior to discharge).    Other  Combivent, voltaren  gel, amaryl , omeprazole, spironolactone , sulcrafate Resume as needed in CIR or at discharge    Clinically significant medication issues were identified that warrant physician communication and completion of prescribed/recommended actions by midnight of the next day:  No  Name of provider notified for urgent issues identified:   Provider Method of Notification:     Pharmacist comments:   Time spent performing this drug regimen review (minutes):  20   Sergio Batch, PharmD, Painter, AAHIVP, CPP Infectious Disease Pharmacist 11/09/2023 2:26 PM

## 2023-11-09 NOTE — Care Management Important Message (Signed)
 Important Message  Patient Details  Name: [name] MRN: [id number] Date of Birth: May 06, 1940   Important Message Given:  Yes - Medicare IM     Claretta Deed 11/09/2023, 11:15 AM

## 2023-11-09 NOTE — Plan of Care (Signed)
  Problem: Consults Goal: RH STROKE PATIENT EDUCATION Description: See Patient Education module for education specifics  Outcome: Progressing   Problem: RH SAFETY Goal: RH STG ADHERE TO SAFETY PRECAUTIONS W/ASSISTANCE/DEVICE Description: STG Adhere to Safety Precautions With cues Assistance/Device. Outcome: Progressing   Problem: RH KNOWLEDGE DEFICIT Goal: RH STG INCREASE KNOWLEDGE OF DIABETES Description: Patient and dtr will be able to manage DM using educational resources for medications and dietary modification independently Outcome: Progressing Goal: RH STG INCREASE KNOWLEDGE OF HYPERTENSION Description: Patient and dtr will be able to manage HTN using educational resources for medications and dietary modification independently Outcome: Progressing Goal: RH STG INCREASE KNOWLEGDE OF HYPERLIPIDEMIA Description: Patient and dtr will be able to manage HLD using educational resources for medications and dietary modification independently Outcome: Progressing Goal: RH STG 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 Description: Patient and dtr will be able to manage secondary risks using educational resources for medications and dietary modification independently Outcome: Progressing

## 2023-11-09 NOTE — H&P (Signed)
 Physical Medicine and Rehabilitation Admission H&P        Chief Complaint  Patient presents with   Functional deficits due to CVA  : HPI: [name] is a 83 year old female with PMHx of A.fib on Eliquis , recent dx of adenocarcinoma of lung undergoing radiation treatment, hx breast cancer, recent admission for NSTEMI with right heart cath (10/4-10/7) presented to [facility] on November 06, 2023, with symptoms of altered mental status, slurred speech, and right-sided weakness. Initial CT imaging of the head indicated a large hypodensity on the right side, suggestive of a large territory infarct. 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 There were also bilateral embolic watershed infarcts and bilateral cerebellum infarcts identified. Neurology assessed the stroke to be potentially related to atrial fibrillation with missed doses of Eliquis  or the recent NSTEMI with cardiac catheterization. An MRA of the head and neck was negative for large vessel occlusion and showed no significant stenosis. Prior to her hospital admission, [name] was limited in the community, using a rollator walker more frequently over the past week and requiring assistance with mobility and functional cognition. Currently, she needs minimal to maximal assistance with activities of daily living (ADLs) and general mobility, and she transfers using moderate assistance. The patient resides with her family in a one-level home with a single step to enter. Therapy evaluations completed due to patient decreased functional mobility was admitted for a comprehensive rehab program.      Review of Systems  Constitutional:  Positive for malaise/fatigue.  HENT: Negative.    Eyes: Negative.   Respiratory: Negative.     Cardiovascular:  Negative for chest pain and claudication.  Gastrointestinal:  Positive for constipation. Negative for nausea and vomiting.  Genitourinary: Negative.  Negative for dysuria and urgency.  Musculoskeletal: Negative.   Skin: Negative.   Neurological:  Positive for sensory change, speech change, focal weakness and weakness. Negative for tingling.  Endo/Heme/Allergies: Negative.   Psychiatric/Behavioral:  Positive for memory loss.   All other systems reviewed and are negative.      Past Medical History:  Diagnosis Date   Acute bronchitis 12/17/2021   Acute cystitis 02/20/2019   Arthritis     Atrial flutter (HCC)     Breast cancer (HCC)      left  2009 and 2025   Diabetes mellitus without complication (HCC)     Dyspnea      mostly with exertion   GERD (gastroesophageal reflux disease)     History of radiation therapy      Left Lung-08/09/23-10/01/23- Dr. 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[facility])     Lupus     Pneumonia 07/31/2021             Past Surgical History:  Procedure Laterality Date   BREAST BIOPSY Left 01/15/2023    US  LT BREAST BX W LOC DEV 1ST LESION IMG BX SPEC US  GUIDE 01/15/2023 [facility] MAMMOGRAPHY   BREAST LUMPECTOMY Left 2009   BRONCHIAL BIOPSY   06/29/2023    Procedure: BRONCHOSCOPY, WITH BIOPSY;  Surgeon: [name], MD;  Location: [facility] ENDOSCOPY;  Service: Pulmonary;;   BRONCHIAL BRUSHINGS   06/29/2023    Procedure: BRONCHOSCOPY, WITH BRUSH BIOPSY;  Surgeon: [name], MD;  Location: [facility] ENDOSCOPY;  Service: Pulmonary;;   BRONCHIAL NEEDLE ASPIRATION BIOPSY   06/29/2023    Procedure: BRONCHOSCOPY, WITH NEEDLE ASPIRATION BIOPSY;  Surgeon: [name], MD;  Location: [facility] ENDOSCOPY;  Service: Pulmonary;;   IR 3D INDEPENDENT WKST   05/29/2021   IR ANGIO INTRA EXTRACRAN SEL COM CAROTID INNOMINATE BILAT MOD SED   05/29/2021   IR ANGIO VERTEBRAL SEL VERTEBRAL BILAT MOD SED   05/29/2021   LEFT HEART CATH  AND CORONARY ANGIOGRAPHY N/A 11/01/2023    Procedure: LEFT HEART CATH AND CORONARY ANGIOGRAPHY;  Surgeon: Wonda Sharper, MD;  Location: [facility] INVASIVE CV LAB;  Service: Cardiovascular;  Laterality: N/A;   SIMPLE MASTECTOMY WITH AXILLARY SENTINEL NODE BIOPSY Left 03/31/2023    Procedure: LEFT MASTECTOMY;  Surgeon: Vernetta Berg, MD;  Location: [facility] OR;  Service: General;  Laterality: Left;  LMA PEC BLOCK   VIDEO BRONCHOSCOPY WITH ENDOBRONCHIAL NAVIGATION Left 06/29/2023    Procedure: VIDEO BRONCHOSCOPY WITH ENDOBRONCHIAL NAVIGATION;  Surgeon: [name], MD;  Location: [facility] ENDOSCOPY;  Service: Pulmonary;  Laterality: Left;   VIDEO BRONCHOSCOPY WITH ENDOBRONCHIAL ULTRASOUND Bilateral 06/29/2023    Procedure: BRONCHOSCOPY, WITH EBUS;  Surgeon: [name], MD;  Location: [facility] ENDOSCOPY;  Service: Pulmonary;  Laterality: Bilateral;             Family History  Problem Relation Age of Onset   Cancer Father          prostate   Prostate cancer Father 70 - 90        metaststic   Asthma Brother     Cancer Daughter          breast   Breast cancer Daughter 75        Social History:  reports that she has never smoked. She has never been exposed to tobacco smoke. She does not have any smokeless tobacco history on file. She reports that she does not drink alcohol  and does not use drugs. Allergies:  Allergies       Allergies  Allergen Reactions   Ciprofloxacin Itching, Swelling and Other (See Comments)      Possibly causing tremors?   Crestor  [Rosuvastatin ] Other (See Comments)      Myalgia and back pain   Lisinopril  Cough   Ofev  [Nintedanib] Nausea Only            Medications Prior to Admission  Medication Sig Dispense Refill   albuterol  (ACCUNEB ) 0.63 MG/3ML nebulizer solution Take 3 mLs (0.63 mg total) by nebulization every 6 (six) hours as needed for wheezing. 75 mL 12   albuterol  (VENTOLIN  HFA) 108 (90 Base) MCG/ACT inhaler Inhale 2 puffs into the lungs every 6 (six) hours as needed  for wheezing or shortness of breath. 8.5 g 2   anastrozole  (ARIMIDEX ) 1 MG tablet Take 1 tablet (1 mg total) by mouth daily. 90 tablet 3   apixaban  (ELIQUIS ) 5 MG TABS tablet Take 1 tablet (5 mg total) by mouth 2 (two) times daily. Okay to restart on 06/30/2023       atorvastatin  (LIPITOR) 40 MG tablet Take 1 tablet (40 mg total) by mouth daily. 30 tablet 0   diclofenac  Sodium (VOLTAREN ) 1 % GEL Apply 1 application. topically 4 (four) times daily as needed (pain).       diltiazem  (CARDIZEM  CD) 120 MG 24 hr capsule Take 1 capsule (120 mg total) by mouth daily. 90 capsule 3   gabapentin  (NEURONTIN ) 300 MG capsule Take 300 mg by mouth 3 (three) times  daily.       glimepiride  (AMARYL ) 1 MG tablet Take 1 mg by mouth daily with breakfast.       hydroxychloroquine  (PLAQUENIL ) 200 MG tablet Take 200 mg by mouth daily.       Ipratropium-Albuterol  (COMBIVENT RESPIMAT) 20-100 MCG/ACT AERS respimat Inhale 1 puff into the lungs every 6 (six) hours as needed for shortness of breath.       levothyroxine  (SYNTHROID ) 100 MCG tablet Take 100 mcg by mouth daily.       omeprazole (PRILOSEC) 20 MG capsule Take 20 mg by mouth daily as needed (Indigestion).       spironolactone  (ALDACTONE ) 25 MG tablet Take 0.5 tablets (12.5 mg total) by mouth daily. 45 tablet 3   sucralfate  (CARAFATE ) 1 g tablet Take 1 g by mouth 4 (four) times daily.       aspirin  EC 81 MG tablet Take 1 tablet (81 mg total) by mouth daily. Swallow whole.       losartan -hydrochlorothiazide  (HYZAAR) 50-12.5 MG tablet Take 1 tablet by mouth daily. (Patient not taking: Reported on 10/31/2023)                  Home: Home Living Family/patient expects to be discharged to:: Inpatient rehab Living Arrangements: Children Available Help at Discharge: Family, Available 24 hours/day Type of Home: House Home Access: Stairs to enter [employer] of Steps: 1 Entrance Stairs-Rails: Right, Left, Can reach both Home Layout: One level Bathroom  Shower/Tub: Engineer, manufacturing systems: Standard Bathroom Accessibility: Yes Home Equipment: Cane - single point, Tub bench, BSC/3in1, Rollator (4 wheels) Additional Comments: enjoys shopping at [employer] With: Family   Functional History: Prior Function Prior Level of Function : Independent/Modified Independent Mobility Comments: Reports using RW since NSTEMI ~1 week ago ADLs Comments: Family drives, has medications pre-dosed in a packet   Functional Status:  Mobility: Bed Mobility Overal bed mobility: Needs Assistance Bed Mobility: Supine to Sit, Sit to Supine Supine to sit: Mod assist, HOB elevated, Used rails Sit to supine: Max assist, HOB elevated General bed mobility comments: Exited to the R side, cues for RUE mgmt and assist to bring RLE off EOB. Inc A for sit>supine 2/2 fatigue. Transfers Overall transfer level: Needs assistance Equipment used: None Transfers: Sit to/from Stand Sit to Stand: From elevated surface, Mod assist General transfer comment: did not assess due to reduced static sitting balance at EOB; anticipate inc A needed 2/2 RHB paresis.   ADL: ADL Overall ADL's : Needs assistance/impaired Eating/Feeding: Minimal assistance, Bed level Eating/Feeding Details (indicate cue type and reason): using L hand (non-dominant) to feed self, needs A for container/beverage mgmt Upper Body Dressing : Maximal assistance, Sitting Upper Body Dressing Details (indicate cue type and reason): adjusting gown Lower Body Dressing: Total assistance, Bed level Lower Body Dressing Details (indicate cue type and reason): donning B socks Toileting- Clothing Manipulation and Hygiene: Total assistance, Bed level Toileting - Clothing Manipulation Details (indicate cue type and reason): purewick   Cognition: Cognition Orientation Level: Oriented to person, Oriented to place, Oriented to situation, Disoriented to time Cognition Arousal: Alert Behavior During Therapy: [facility]  for tasks assessed/performed   Physical Exam: Blood pressure (!) 123/56, pulse 76, temperature 98 F (36.7 C), temperature source Oral, resp. rate 16, height 5' 7 (1.702 m), weight 54.4 kg, SpO2 100%. Physical Exam Vitals and nursing note reviewed. Exam conducted with a chaperone present.  Constitutional:      Comments: Pt awake, alert, sitting up in bed;  appears almost cachetic, daughter at bedside, NAD  HENT:     Head: Normocephalic and atraumatic.     Comments: R facial droop Tongue midline Facial sensation intact B/L    Right Ear: External ear normal.     Left Ear: External ear normal.     Nose: Nose normal. No congestion.     Mouth/Throat:     Mouth: Mucous membranes are dry.     Pharynx: Oropharynx is clear. No oropharyngeal exudate.  Eyes:     General:        Right eye: No discharge.        Left eye: No discharge.     Comments: Difficulty following commands to follow my finger- hard for pt to look anywhere but straight ahead- sleepy  Cardiovascular:     Rate and Rhythm: Normal rate and regular rhythm.     Heart sounds: Normal heart sounds. No murmur heard.    No gallop.  Pulmonary:     Effort: Pulmonary effort is normal. No respiratory distress.     Breath sounds: Normal breath sounds. No wheezing, rhonchi or rales.     Comments: Decreased in bases slightly- not big breaths Abdominal:     General: There is distension.     Palpations: Abdomen is soft.  Musculoskeletal:     Comments: RUE- 0/5 even shoulder shrug LUE 4+/5 throughout RLE- HF 1/5; KE/KF 0/5; DF 0/5; PF 2-/5 LLE- 4+/5 throughout   Needed propping to sit upright by pillows  Skin:    General: Skin is warm and dry.     Comments: IV looks ok-  Mastectomy scars noted on R  Neurological:     Mental Status: She is alert.     Comments: Delayed responses- very slowed Naming intact, repetition is intact No increased tone/no clonus no hoffman's Decreased to light touch on RLE< but not RUE or face   Psychiatric:     Comments: Very flat       Lab Results Last 48 Hours        Results for orders placed or performed during the hospital encounter of 11/06/23 (from the past 48 hours)  Glucose, capillary     Status: Abnormal    Collection Time: 11/07/23  3:33 PM  Result Value Ref Range    Glucose-Capillary 109 (H) 70 - 99 mg/dL      Comment: Glucose reference range applies only to samples taken after fasting for at least 8 hours.  APTT     Status: Abnormal    Collection Time: 11/07/23  6:26 PM  Result Value Ref Range    aPTT 84 (H) 24 - 36 seconds      Comment:        IF BASELINE aPTT IS ELEVATED, SUGGEST PATIENT RISK ASSESSMENT BE USED TO DETERMINE APPROPRIATE ANTICOAGULANT THERAPY. Performed at [facility], [address] [place], [place] [address]    Heparin  level (unfractionated)     Status: Abnormal    Collection Time: 11/07/23  6:26 PM  Result Value Ref Range    Heparin  Unfractionated >1.10 (H) 0.30 - 0.70 IU/mL      Comment: (NOTE) The clinical reportable range upper limit is being lowered to >1.10 to align with the FDA approved guidance for the current laboratory assay.   If heparin  results are below expected values, and patient dosage has  been confirmed, suggest follow up testing of antithrombin III levels. Performed at [facility], [address], [place], [place]  [address]    CBC     Status: Abnormal    Collection Time: 11/08/23  2:06 AM  Result Value Ref Range    WBC 4.8 4.0 - 10.5 K/uL    RBC 3.44 (L) 3.87 - 5.11 MIL/uL    Hemoglobin 9.8 (L) 12.0 - 15.0 g/dL    HCT 70.4 (L) 63.9 - 46.0 %    MCV 85.8 80.0 - 100.0 fL    MCH 28.5 26.0 - 34.0 pg    MCHC 33.2 30.0 - 36.0 g/dL    RDW 83.2 (H) 88.4 - 15.5 %    Platelets 118 (L) 150 - 400 K/uL    nRBC 0.0 0.0 - 0.2 %      Comment: Performed at [facility], [address], [place], [place] [address]  Heparin  level (unfractionated)     Status: Abnormal    Collection Time: 11/08/23  2:06  AM  Result Value Ref Range    Heparin  Unfractionated >1.10 (H) 0.30 - 0.70 IU/mL      Comment: (NOTE) The clinical reportable range upper limit is being lowered to >1.10 to align with the FDA approved guidance for the current laboratory assay.   If heparin  results are below expected values, and patient dosage has  been confirmed, suggest follow up testing of antithrombin III levels. Performed at [facility], [address], [place], [place] [address]    APTT     Status: Abnormal    Collection Time: 11/08/23  9:44 AM  Result Value Ref Range    aPTT 79 (H) 24 - 36 seconds      Comment:        IF BASELINE aPTT IS ELEVATED, SUGGEST PATIENT RISK ASSESSMENT BE USED TO DETERMINE APPROPRIATE ANTICOAGULANT THERAPY. Performed at [facility], [address], [place], [place] [address]    CBC     Status: Abnormal    Collection Time: 11/09/23  1:15 AM  Result Value Ref Range    WBC 4.9 4.0 - 10.5 K/uL    RBC 3.36 (L) 3.87 - 5.11 MIL/uL    Hemoglobin 9.7 (L) 12.0 - 15.0 g/dL    HCT 70.8 (L) 63.9 - 46.0 %    MCV 86.6 80.0 - 100.0 fL    MCH 28.9 26.0 - 34.0 pg    MCHC 33.3 30.0 - 36.0 g/dL    RDW 82.9 (H) 88.4 - 15.5 %    Platelets 160 150 - 400 K/uL    nRBC 0.0 0.0 - 0.2 %      Comment: Performed at [facility], [address], [place], [place] [address]      Imaging Results (Last 48 hours)  No results found.         Blood pressure (!) 123/56, pulse 76, temperature 98 F (36.7 C), temperature source Oral, resp. rate 16, height 5' 7 (1.702 m), weight 54.4 kg, SpO2 100%.   Medical Problem List and Plan: 1. Functional deficits secondary to  bilateral embolic shower, embolic pattern, etiology:  Afib and missed Eliquis  doses vs from NSTEMI with cardiac cath.              -patient may  shower             -ELOS/Goals: 12-15 days min A-              Cannot sit upright without propping             Admit  to CIR- is appropriate 2.   Antithrombotics: -DVT/anticoagulation:  Pharmaceutical: Eliquis  Transitioned from IV Heparin  to Eliquis  10/14             -antiplatelet therapy: Asprin 81 mg  3. Pain Management: Gabapentin  and Tylenol  as needed 4. Mood/Behavior/Sleep: LCSW to follow for evaluation and support when available.              -antipsychotic agents: N/A 5. Neuropsych/cognition: This patient is not quite capable of making decisions on her own behalf. 6. Skin/Wound Care: routine pressure relief measures.  7. Fluids/Electrolytes/Nutrition: monitor intake and output. Follow up labs in a.m.              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 Continue aspirin  and statin.   9. CAD: 10/4-10/7 admitted for NSTEMI  10. HLD: Lipitor 40 mg  11. HTN: monitor blood pressures on  Hyzaar, Cardizem , and Aldactone   12. Paroxymal Atrial Flutter/Fib: continue Eliquis  2.5 bid 13. T2DM: HgbA1c 6.0, resumed home Amaryl   - monitor cbg AC/HS with SSI.  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  Hypothyroidism: Synthroid  17.  Chronic anemia: Stable, monitor CBC.  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 Constipation- will order Milk of Mg per pt request and Miralax  daily       Daphne LOISE Satterfield, NP 11/09/2023   I have personally performed a face to face diagnostic evaluation of this patient and formulated the key components of the plan.  Additionally, I have personally reviewed laboratory data, imaging studies, as well as relevant notes and concur with the physician assistant's documentation above.   The patient's status has not changed from the original H&P.  Any changes in documentation from the acute care chart have been noted above.

## 2023-11-09 NOTE — Progress Notes (Signed)
 Babs Arthea DASEN, MD  Physician Physical Medicine and Rehabilitation   Consult Note    Signed   Date of Service: 11/08/2023 10:09 AM  Related encounter: ED to Hosp-Admission (Discharged) from 11/06/2023 in [facility]   Signed     Expand All Collapse All  Show:Clear all [x] Written[x] Templated[] Copied  Added by: [x] Babs Arthea DASEN, MD  [] Hover for details          Physical Medicine and Rehabilitation Consult Reason for Consult:impaired functional mobility Referring Physician: Darci     HPI: [name] is a 83 y.o. 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  CT of the head revealed large hypodensity on the right concerning for large territory infarct.  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  Stroke consistent with embolic.  Patient apparently missed Eliquis  doses.  Patient was placed on aspirin  and IV heparin  with transition to Eliquis  per neurology and cardiology recommendations.  Patient was seen by therapies yesterday and was max assist for sit to supine transfers and mod assist for sit to stand transfer.  She was able to stand for about 30 seconds with mod to max assistance.  Gait was not tested.  Patient was modified independent prior to arrival.  She was using a rolling walker since her heart attack a week ago.  She lives in a 1 level house with one-step to enter.  Family is at home.       Home: Home Living Family/patient expects to be discharged to:: Inpatient rehab Living Arrangements: Children Available Help at Discharge:  Family, Available 24 hours/day Type of Home: House Home Access: Stairs to enter [employer] of Steps: 1 Entrance Stairs-Rails: Right, Left, Can reach both Home Layout: One level Bathroom Shower/Tub: Engineer, manufacturing systems: Standard Bathroom Accessibility: Yes Home Equipment: Cane - single point, Tub bench, BSC/3in1, Rollator (4 wheels) Additional Comments: enjoys shopping at [employer]  Functional History: Prior Function Prior Level of Function : Independent/Modified Independent Mobility Comments: Reports using RW since NSTEMI ~1 week ago ADLs Comments: Family drives, has medications pre-dosed in a packet Functional Status:  Mobility: Bed Mobility Overal bed mobility: Needs Assistance Bed Mobility: Supine to Sit, Sit to Supine Supine to sit: HOB elevated, Mod assist Sit to supine: Max assist General bed mobility comments: Pt sat up on L side of the stretcher with increased time and cues for seqeuencing. She was able to advance LLE slowly. Assist to move RLE and advance trunk. Scooted pt fwd with hips until feet flat. Returning to bed eased pt trunk down on L arm, brought BLE back into bed, rolled her onto back, and scooted her up in bed. Transfers Overall transfer level: Needs assistance Equipment used: None Transfers: Sit to/from Stand Sit to Stand: From elevated surface, Mod assist General transfer comment: Pt stood from raised stretcher. She held onto PT's upper arm with LUE. PT supported RUE and provided anterior knee block d/t weakness. Powered up with modA. Her RLE appeared to hyperextend. She stood statically for ~30secs with mod-maxA to maintain stability d/t posterior and right lateral lean.   ADL:   Cognition: Cognition Orientation Level: Oriented to person Cognition Arousal:  Alert Behavior During Therapy: [employer] [facility] for tasks assessed/performed     Review of Systems  HENT: Negative.    Eyes: Negative.   Respiratory: Negative.    Cardiovascular:  Negative.   Gastrointestinal: Negative.   Genitourinary: Negative.   Musculoskeletal: Negative.   Skin: Negative.   Neurological:  Positive for focal weakness and weakness.  Psychiatric/Behavioral: Negative.         Past Medical History:  Diagnosis Date   Acute bronchitis 12/17/2021   Acute cystitis 02/20/2019   Arthritis     Atrial flutter (HCC)     Breast cancer (HCC)      left  2009 and 2025   Diabetes mellitus without complication (HCC)     Dyspnea      mostly with exertion   GERD (gastroesophageal reflux disease)     History of radiation therapy      Left Lung-08/09/23-10/01/23- Dr. 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[facility])     Lupus     Pneumonia 07/31/2021             Past Surgical History:  Procedure Laterality Date   BREAST BIOPSY Left 01/15/2023    US  LT BREAST BX W LOC DEV 1ST LESION IMG BX SPEC US  GUIDE 01/15/2023 [facility]AMMOGRAPHY   BREAST LUMPECTOMY Left 2009   BRONCHIAL BIOPSY   06/29/2023    Procedure: BRONCHOSCOPY, WITH BIOPSY;  Surgeon: [name], MD;  Location: [facility] ENDOSCOPY;  Service: Pulmonary;;   BRONCHIAL BRUSHINGS   06/29/2023    Procedure: BRONCHOSCOPY, WITH BRUSH BIOPSY;  Surgeon: [name], MD;  Location: [facility] ENDOSCOPY;  Service: Pulmonary;;   BRONCHIAL NEEDLE ASPIRATION BIOPSY   06/29/2023    Procedure: BRONCHOSCOPY, WITH NEEDLE ASPIRATION BIOPSY;  Surgeon: [name], MD;  Location: [facility] ENDOSCOPY;  Service: Pulmonary;;   IR 3D INDEPENDENT WKST   05/29/2021   IR ANGIO INTRA EXTRACRAN SEL COM CAROTID INNOMINATE BILAT MOD SED   05/29/2021   IR ANGIO VERTEBRAL SEL VERTEBRAL BILAT MOD SED   05/29/2021   LEFT HEART CATH AND CORONARY ANGIOGRAPHY N/A 11/01/2023    Procedure: LEFT HEART CATH AND CORONARY ANGIOGRAPHY;  Surgeon: Wonda Sharper, MD;  Location: [facility] INVASIVE CV LAB;  Service: Cardiovascular;  Laterality: N/A;   SIMPLE MASTECTOMY WITH AXILLARY SENTINEL NODE BIOPSY Left 03/31/2023     Procedure: LEFT MASTECTOMY;  Surgeon: Vernetta Berg, MD;  Location: [facility] OR;  Service: General;  Laterality: Left;  LMA PEC BLOCK   VIDEO BRONCHOSCOPY WITH ENDOBRONCHIAL NAVIGATION Left 06/29/2023    Procedure: VIDEO BRONCHOSCOPY WITH ENDOBRONCHIAL NAVIGATION;  Surgeon: [name], MD;  Location: [facility] ENDOSCOPY;  Service: Pulmonary;  Laterality: Left;   VIDEO BRONCHOSCOPY WITH ENDOBRONCHIAL ULTRASOUND Bilateral 06/29/2023    Procedure: BRONCHOSCOPY, WITH EBUS;  Surgeon: [name], MD;  Location: [facility] ENDOSCOPY;  Service: Pulmonary;  Laterality: Bilateral;             Family History  Problem Relation Age of Onset   Cancer Father          prostate   Prostate cancer Father 26 - 44        metaststic   Asthma Brother     Cancer Daughter          breast   Breast cancer Daughter 74        Social History:  reports that she has never smoked. She has never been exposed to tobacco smoke. She  does not have any smokeless tobacco history on file. She reports that she does not drink alcohol  and does not use drugs. Allergies:  Allergies       Allergies  Allergen Reactions   Ciprofloxacin Itching, Swelling and Other (See Comments)      Possibly causing tremors?   Crestor  [Rosuvastatin ] Other (See Comments)      Myalgia and back pain   Lisinopril  Cough   Ofev  [Nintedanib] Nausea Only            Medications Prior to Admission  Medication Sig Dispense Refill   albuterol  (ACCUNEB ) 0.63 MG/3ML nebulizer solution Take 3 mLs (0.63 mg total) by nebulization every 6 (six) hours as needed for wheezing. 75 mL 12   albuterol  (VENTOLIN  HFA) 108 (90 Base) MCG/ACT inhaler Inhale 2 puffs into the lungs every 6 (six) hours as needed for wheezing or shortness of breath. 8.5 g 2   anastrozole  (ARIMIDEX ) 1 MG tablet Take 1 tablet (1 mg total) by mouth daily. 90 tablet 3   apixaban  (ELIQUIS ) 5 MG TABS tablet Take 1 tablet (5 mg total) by mouth 2 (two) times daily. Okay to restart on 06/30/2023        atorvastatin  (LIPITOR) 40 MG tablet Take 1 tablet (40 mg total) by mouth daily. 30 tablet 0   diclofenac  Sodium (VOLTAREN ) 1 % GEL Apply 1 application. topically 4 (four) times daily as needed (pain).       diltiazem  (CARDIZEM  CD) 120 MG 24 hr capsule Take 1 capsule (120 mg total) by mouth daily. 90 capsule 3   gabapentin  (NEURONTIN ) 300 MG capsule Take 300 mg by mouth 3 (three) times daily.       glimepiride  (AMARYL ) 1 MG tablet Take 1 mg by mouth daily with breakfast.       hydroxychloroquine  (PLAQUENIL ) 200 MG tablet Take 200 mg by mouth daily.       Ipratropium-Albuterol  (COMBIVENT RESPIMAT) 20-100 MCG/ACT AERS respimat Inhale 1 puff into the lungs every 6 (six) hours as needed for shortness of breath.       levothyroxine  (SYNTHROID ) 100 MCG tablet Take 100 mcg by mouth daily.       omeprazole (PRILOSEC) 20 MG capsule Take 20 mg by mouth daily as needed (Indigestion).       spironolactone  (ALDACTONE ) 25 MG tablet Take 0.5 tablets (12.5 mg total) by mouth daily. 45 tablet 3   sucralfate  (CARAFATE ) 1 g tablet Take 1 g by mouth 4 (four) times daily.       aspirin  EC 81 MG tablet Take 1 tablet (81 mg total) by mouth daily. Swallow whole.       losartan -hydrochlorothiazide  (HYZAAR) 50-12.5 MG tablet Take 1 tablet by mouth daily. (Patient not taking: Reported on 10/31/2023)                Blood pressure 124/71, pulse 80, temperature 98.6 F (37 C), resp. rate 16, height 5' 7 (1.702 m), weight 54.4 kg, SpO2 100%. Physical Exam HENT:     Head: Normocephalic.     Right Ear: External ear normal.     Left Ear: External ear normal.     Nose: Nose normal.     Mouth/Throat:     Mouth: Mucous membranes are moist.  Eyes:     Conjunctiva/sclera: Conjunctivae normal.  Cardiovascular:     Rate and Rhythm: Normal rate.  Pulmonary:     Effort: Pulmonary effort is normal.  Abdominal:     Palpations: Abdomen is soft.  Musculoskeletal:  General: Normal range of motion.     Cervical back:  Normal range of motion.  Skin:    General: Skin is warm.  Neurological:     Mental Status: She is alert.     Comments: Alert and oriented x 3. Fair insight and awareness. Intact Memory. Normal language and speech. Some delays in processng. Cranial nerve exam unremarkable. MMT: RUE 4-/5 prox to distal. RLE 4-/5 prox to 4/5 distally. LUE and LLE 4+ to 5/5. Sensory exam normal for light touch and pain in all 4 limbs. No limb ataxia or cerebellar signs. No abnormal tone appreciated.  .         Lab Results Last 24 Hours       Results for orders placed or performed during the hospital encounter of 11/06/23 (from the past 24 hours)  Glucose, capillary     Status: Abnormal    Collection Time: 11/07/23  3:33 PM  Result Value Ref Range    Glucose-Capillary 109 (H) 70 - 99 mg/dL  APTT     Status: Abnormal    Collection Time: 11/07/23  6:26 PM  Result Value Ref Range    aPTT 84 (H) 24 - 36 seconds  Heparin  level (unfractionated)     Status: Abnormal    Collection Time: 11/07/23  6:26 PM  Result Value Ref Range    Heparin  Unfractionated >1.10 (H) 0.30 - 0.70 IU/mL  CBC     Status: Abnormal    Collection Time: 11/08/23  2:06 AM  Result Value Ref Range    WBC 4.8 4.0 - 10.5 K/uL    RBC 3.44 (L) 3.87 - 5.11 MIL/uL    Hemoglobin 9.8 (L) 12.0 - 15.0 g/dL    HCT 70.4 (L) 63.9 - 46.0 %    MCV 85.8 80.0 - 100.0 fL    MCH 28.5 26.0 - 34.0 pg    MCHC 33.2 30.0 - 36.0 g/dL    RDW 83.2 (H) 88.4 - 15.5 %    Platelets 118 (L) 150 - 400 K/uL    nRBC 0.0 0.0 - 0.2 %  Heparin  level (unfractionated)     Status: Abnormal    Collection Time: 11/08/23  2:06 AM  Result Value Ref Range    Heparin  Unfractionated >1.10 (H) 0.30 - 0.70 IU/mL       Imaging Results (Last 48 hours)  ECHOCARDIOGRAM COMPLETE Result Date: 11/07/2023    ECHOCARDIOGRAM REPORT   Patient Name:   KENIA TEAGARDEN Date of Exam: 11/07/2023 Medical Rec #:  [id number]      Height:       67.0 in Accession #:    [id number]     Weight:        120.0 lb Date of Birth:  05-10-1940       BSA:          1.628 m Patient Age:    83 years       BP:           156/66 mmHg Patient Gender: F              HR:           88 bpm. Exam Location:  Inpatient Procedure: 2D Echo, Cardiac Doppler and Color Doppler (Both Spectral and Color            Flow Doppler were utilized during procedure). Indications:    Stroke I63.9  History:        Patient has prior history of Echocardiogram  examinations, most                 recent 11/01/2023. CHF, Previous Myocardial Infarction and CAD,                 Stroke and CKD, stage 3, Arrythmias:Atrial Flutter,                 Signs/Symptoms:Dyspnea, Shortness of Breath and Syncope; Risk                 Factors:Hypertension and Diabetes.  Sonographer:    Thea Norlander RCS Referring Phys: MAXIMINO DELENA SHARPS  Sonographer Comments: Image acquisition challenging due to patient body habitus. IMPRESSIONS  1. Left ventricular ejection fraction, by estimation, is 60 to 65%. The left ventricle has normal function. The left ventricle has no regional wall motion abnormalities. There is moderate asymmetric left ventricular hypertrophy of the septal and basal segments. Left ventricular diastolic parameters are indeterminate.  2. Right ventricular systolic function is normal. The right ventricular size is normal. There is mildly elevated pulmonary artery systolic pressure. The estimated right ventricular systolic pressure is 36.9 mmHg.  3. The mitral valve is degenerative. There is nodular thickening at the leatlet tips and loose chordal structure as well. Mild mitral valve regurgitation.  4. The aortic valve is tricuspid. Aortic valve regurgitation is moderate.  5. The inferior vena cava is normal in size with greater than 50% respiratory variability, suggesting right atrial pressure of 3 mmHg. Comparison(s): Prior images reviewed side by side. LVEF 60-65%. Mild mitral regurgitation with degenerative valve. Aortic regurgitation is now moderate - no obvious  vegetation. FINDINGS  Left Ventricle: Left ventricular ejection fraction, by estimation, is 60 to 65%. The left ventricle has normal function. The left ventricle has no regional wall motion abnormalities. The left ventricular internal cavity size was normal in size. There is  moderate asymmetric left ventricular hypertrophy of the septal and basal segments. Left ventricular diastolic parameters are indeterminate. Right Ventricle: The right ventricular size is normal. No increase in right ventricular wall thickness. Right ventricular systolic function is normal. There is mildly elevated pulmonary artery systolic pressure. The tricuspid regurgitant velocity is 2.91  m/s, and with an assumed right atrial pressure of 3 mmHg, the estimated right ventricular systolic pressure is 36.9 mmHg. Left Atrium: Left atrial size was normal in size. Right Atrium: Right atrial size was normal in size. Pericardium: There is no evidence of pericardial effusion. Presence of epicardial fat layer. Mitral Valve: The mitral valve is degenerative in appearance. Mild mitral valve regurgitation. Tricuspid Valve: The tricuspid valve is grossly normal. Tricuspid valve regurgitation is mild. Aortic Valve: The aortic valve is tricuspid. There is mild aortic valve annular calcification. Aortic valve regurgitation is moderate. Aortic valve peak gradient measures 6.7 mmHg. Pulmonic Valve: The pulmonic valve was grossly normal. Pulmonic valve regurgitation is mild. Aorta: The aortic root and ascending aorta are structurally normal, with no evidence of dilitation. Venous: The inferior vena cava is normal in size with greater than 50% respiratory variability, suggesting right atrial pressure of 3 mmHg. IAS/Shunts: No atrial level shunt detected by color flow Doppler. Additional Comments: 3D was performed not requiring image post processing on an independent workstation and was indeterminate.  LEFT VENTRICLE PLAX 2D LVIDd:         3.50 cm   Diastology  LVIDs:         2.00 cm   LV e' medial:    4.35 cm/s LV PW:  0.90 cm   LV E/e' medial:  17.8 LV IVS:        0.90 cm   LV e' lateral:   9.25 cm/s LVOT diam:     2.20 cm   LV E/e' lateral: 8.4 LV SV:         60 LV SV Index:   37 LVOT Area:     3.80 cm  RIGHT VENTRICLE             IVC RV S prime:     17.90 cm/s  IVC diam: 1.00 cm TAPSE (M-mode): 1.4 cm LEFT ATRIUM             Index       RIGHT ATRIUM          Index LA diam:        2.30 cm 1.41 cm/m  RA Area:     5.05 cm LA Vol (A2C):   15.9 ml 9.77 ml/m  RA Volume:   4.44 ml  2.73 ml/m LA Vol (A4C):   8.8 ml  5.42 ml/m LA Biplane Vol: 11.0 ml 6.76 ml/m  AORTIC VALVE AV Area (Vmax): 2.54 cm AV Vmax:        129.00 cm/s AV Peak Grad:   6.7 mmHg LVOT Vmax:      86.20 cm/s LVOT Vmean:     57.500 cm/s LVOT VTI:       0.159 m  AORTA Ao Root diam: 3.10 cm Ao Asc diam:  3.20 cm MITRAL VALVE                TRICUSPID VALVE MV Area (PHT): 3.30 cm     TR Peak grad:   33.9 mmHg MV Decel Time: 230 msec     TR Vmax:        291.00 cm/s MV E velocity: 77.50 cm/s MV A velocity: 127.00 cm/s  SHUNTS MV E/A ratio:  0.61         Systemic VTI:  0.16 m                             Systemic Diam: 2.20 cm Jayson Sierras MD Electronically signed by Jayson Sierras MD Signature Date/Time: 11/07/2023/12:29:19 PM    Final     MR BRAIN WO CONTRAST Result Date: 11/06/2023 EXAM: MR Brain without Intravenous Contrast. CLINICAL HISTORY: Stroke, follow up. Episodes of slurred speech yesterday. New onset right-sided weakness today. TECHNIQUE: Magnetic resonance images of the brain without intravenous contrast in multiple planes. CONTRAST: Without. COMPARISON: None provided. FINDINGS: BRAIN: No intracranial mass or hemorrhage. No midline shift or extra-axial fluid collection. No cerebellar tonsillar ectopia. The central arterial and venous flow voids are patent. Diffuse scattered punctate foci of restricted diffusion present throughout a watershed distribution bilaterally involving  multiple small cortical infarcts over the convexities. Small acute infarcts are present in the inferior right frontal lobe and right caudate head. Multiple cortical infarcts are present along the posterior parietal lobes and occipital lobes bilaterally. A single right lateral pontine infarct is present. Posterior cerebellar infarcts are present bilaterally as well. T2 and FLAIR hyperintensities. Additional confluent periventricular T2 hyperintensities are present bilaterally. VENTRICLES: No hydrocephalus. ORBITS: The orbits are normal. SINUSES AND MASTOIDS: The sinuses and mastoid air cells are clear. BONES: No acute fracture or focal osseous lesion. IMPRESSION: 1. Multiple acute infarcts in a bilateral watershed distribution involving the convexities, inferior right frontal lobe, right caudate head, posterior parietal and occipital  lobes, right lateral pons, and posterior cerebellum. Findings suggest a central embolic source 2. Confluent periventricular T2/FLAIR hyperintensities bilaterally, suggestive of chronic microvascular ischemic change. Electronically signed by: Lonni Necessary MD 11/06/2023 06:35 PM EDT RP Workstation: [id number]    MR ANGIO HEAD WO CONTRAST Result Date: 11/06/2023 EXAM: MR Angiography Head without intravenous Contrast. 11/06/2023 05:53:00 PM TECHNIQUE: Magnetic resonance angiography images of the head without intravenous contrast. Multiplanar 2D and 3D reformatted images are provided for review. COMPARISON: None provided. CLINICAL HISTORY: Neuro deficit, acute, stroke suspected. 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 Both A2 segments filled from the left. No significant stenosis of the internal carotid arteries. No significant stenosis of the middle cerebral arteries. No aneurysm. POSTERIOR CIRCULATION: The left vertebral artery is a dominant vessel. No significant stenosis of the posterior cerebral arteries. No significant stenosis of the basilar artery.  No aneurysm. IMPRESSION: 1. No significant intracranial arterial stenosis or vessel occlusion Electronically signed by: Lonni Necessary MD 11/06/2023 06:29 PM EDT RP Workstation: [id number]    MR ANGIO NECK WO CONTRAST Result Date: 11/06/2023 EXAM: MRA Neck without 11/06/2023 05:20:00 PM TECHNIQUE: Multiplanar multisequence MRA of the neck was performed without the administration of intravenous contrast. 2D and 3D reformatted images are provided for review. Stenosis of the internal carotid arteries measured using NASCET criteria. COMPARISON: None available CLINICAL HISTORY: Neuro deficit, acute, stroke suspected. FINDINGS: AORTIC ARCH AND ARCH VESSELS: No dissection or arterial injury. No significant stenosis of the brachiocephalic or subclavian arteries. CAROTID ARTERIES: No dissection, arterial injury, or hemodynamically significant stenosis by NASCET criteria. No significant flow disturbance is demonstrated. VERTEBRAL ARTERIES: Flow is antegrade in the vertebral arteries bilaterally. No dissection, arterial injury, or significant stenosis. IMPRESSION: 1. No evidence of dissection, arterial injury, or hemodynamically significant stenosis in the aortic arch, arch vessels, carotid arteries, or vertebral arteries. Electronically signed by: Lonni Necessary MD 11/06/2023 06:16 PM EDT RP Workstation: [id number]    CT HEAD WO CONTRAST Result Date: 11/06/2023 EXAM: CT HEAD WITHOUT CONTRAST 11/06/2023 01:36:00 PM TECHNIQUE: CT of the head was performed without the administration of intravenous contrast. Automated exposure control, iterative reconstruction, and/or weight based adjustment of the mA/kV was utilized to reduce the radiation dose to as low as reasonably achievable. COMPARISON: CT head without contrast 09/13/2023. CLINICAL HISTORY: Neuro deficit, acute, stroke suspected. Right lower extremity weakness. FINDINGS: BRAIN AND VENTRICLES: Her changes are stable and likely within normal limits for age.  Atherosclerotic calcifications are present in the cavernous carotid arteries bilaterally and at the dural margin of both vertebral arteries. No hyperdense vessel is present. No acute hemorrhage. No evidence of acute infarct. No hydrocephalus. No extra-axial collection. No mass effect or midline shift. ORBITS: No acute abnormality. SINUSES: No acute abnormality. SOFT TISSUES AND SKULL: No acute soft tissue abnormality. No skull fracture. IMPRESSION: 1. No acute intracranial abnormality. 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 Yes Does the patient require coordinated care of a physician, rehab nurse, therapy disciplines of PT, OT, SLP to address physical and functional deficits in the context of the above medical diagnosis(es)? Yes Addressing deficits in the following areas: balance, endurance, locomotion, strength, transferring, bowel/bladder control, bathing, dressing, feeding, grooming, toileting, cognition, speech, and psychosocial support Can the patient actively participate in an intensive therapy program of at least 3 hrs of therapy per day at least 5 days per week? Yes The potential for patient to make  measurable gains while on inpatient rehab is excellent Anticipated functional outcomes upon discharge from inpatient rehab are modified independent and supervision  with PT, modified independent and supervision with OT, modified independent and supervision with SLP. Estimated rehab length of stay to reach the above functional goals is: 12-15 days Anticipated discharge destination: Home Overall Rehab/Functional Prognosis: excellent   POST ACUTE RECOMMENDATIONS: This patient's condition is appropriate for continued rehabilitative care in the following setting: CIR Patient has agreed to participate in recommended program. Yes Note that insurance prior authorization may be required for reimbursement for recommended care.   Comment: Pt states that daughter is home with her. Rehab Admissions Coordinator to follow up.           I have personally performed a face to face diagnostic evaluation of this patient. Additionally, I have examined the patient's medical record including any pertinent labs and radiographic images.     Thanks,   Arthea ONEIDA Gunther, MD 11/08/2023          Routing History

## 2023-11-09 NOTE — Discharge Summary (Signed)
 Physician Discharge Summary   Patient: [name] MRN: [id number] DOB: 09/01/1940  Admit date:     11/06/2023  Discharge date: 11/09/23  Discharge Physician: Concepcion Riser   PCP: [name], MD   Recommendations at discharge:   PCP follow up in 1 week after rehab discharge. Neurology follow up in 4 weeks.  Discharge Diagnoses: Principal Problem:   CVA (cerebral vascular accident) [facility]) Active Problems:   Right sided weakness   Essential hypertension   Paroxysmal atrial flutter (HCC)   Normocytic anemia   History of lung cancer   Mixed hyperlipidemia   CAD S/P percutaneous coronary angioplasty   Diabetes mellitus type 2, noninsulin dependent (HCC)   Hypothyroidism  Resolved Problems:   * No resolved hospital problems. *  Hospital Course: [name] is a 83 y.o. 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    MRI brain showed multiple acute infarcts in bilateral watershed distribution involving right frontal lobe right caudate head, posterior bilateral occipital lobes right lateral pons and posterior cerebellum suggesting cardioembolic source.  Neurology evaluated the patient recommended full stroke workup. She is started on Heparin  drip per pharmacy protocol. She missed Eliquis  doses at nights per daughter. Neurology and oncology advised to continue Eliquis  therapy, dose decreased to 2.5 bid due to her age, weight. Cardiology agreed to continue aspirin  81 daily. PT advised CIR for continued rehab therapy.   Assessment and Plan: Acute ischemic stroke Embolic CVA- MRI brain reviewed shows multiple acute infarcts in watershed distribution. Neurology follow up appreciated.  This could be related to recent heart cath vs high risk for hypercoagulability given Aflutter, lung cancer. Echo shows EF 60%, increased RVSP at 36.9 mmHg, mild MR, moderate AR Continue aspirin , statin.   She did miss few doses of Eliquis  per daughter.  Heparin  drip transitioned to Eliquis  per neurology and oncology input. Compliance with medications advised. PT OT suggested CIR, stable to go to CIR today.   Paroxysmal A-flutter- Resumed Cardizem . Eliquis  dose decreased to 2.5mg  bid per pharmacy protocol.   Essential hypertension- Resumed Cardizem  and ARB.   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 She is at risk for strokes due to her cancer history. Follow oncology team as outpatient.   Type 2 diabetes mellitus: A1c 6 Resumed Amaryl . Carb consistent diet. Hypoglycemia protocol.   SLE-on Plaquenil .   Chronic anemia-stable.   Chronic hyponatremia-stable.    Hypothyroidism-continue home dose Synthroid        Consultants: Neurology, oncology. Procedures performed: none  Disposition: Rehabilitation facility Diet recommendation:  Discharge Diet Orders (From admission, onward)     Start     Ordered   11/09/23 0000  Diet - low sodium heart healthy        11/09/23 1109           Cardiac and Carb modified diet DISCHARGE MEDICATION: Allergies as of 11/09/2023       Reactions   Ciprofloxacin Itching, Swelling, Other (See Comments)   Possibly causing tremors?   Crestor  [rosuvastatin ] Other (See Comments)   Myalgia and back pain   Lisinopril  Cough   Ofev  [nintedanib] Nausea Only        Medication List     STOP taking these medications    losartan -hydrochlorothiazide  50-12.5 MG  tablet Commonly known as: HYZAAR       TAKE these medications    albuterol  108 (90 Base) MCG/ACT inhaler Commonly known as: VENTOLIN  HFA Inhale 2 puffs into the lungs every 6 (six) hours as needed for wheezing or shortness of breath.    albuterol  0.63 MG/3ML nebulizer solution Commonly known as: ACCUNEB  Take 3 mLs (0.63 mg total) by nebulization every 6 (six) hours as needed for wheezing.   anastrozole  1 MG tablet Commonly known as: ARIMIDEX  Take 1 tablet (1 mg total) by mouth daily.   apixaban  2.5 MG Tabs tablet Commonly known as: ELIQUIS  Take 1 tablet (2.5 mg total) by mouth 2 (two) times daily. What changed:  medication strength how much to take additional instructions   aspirin  EC 81 MG tablet Take 1 tablet (81 mg total) by mouth daily. Swallow whole.   atorvastatin  40 MG tablet Commonly known as: LIPITOR Take 1 tablet (40 mg total) by mouth daily.   Combivent Respimat 20-100 MCG/ACT Aers respimat Generic drug: Ipratropium-Albuterol  Inhale 1 puff into the lungs every 6 (six) hours as needed for shortness of breath.   diclofenac  Sodium 1 % Gel Commonly known as: VOLTAREN  Apply 1 application. topically 4 (four) times daily as needed (pain).   diltiazem  120 MG 24 hr capsule Commonly known as: Cardizem  CD Take 1 capsule (120 mg total) by mouth daily.   gabapentin  300 MG capsule Commonly known as: NEURONTIN  Take 300 mg by mouth 3 (three) times daily.   glimepiride  1 MG tablet Commonly known as: AMARYL  Take 1 mg by mouth daily with breakfast.   hydroxychloroquine  200 MG tablet Commonly known as: PLAQUENIL  Take 200 mg by mouth daily.   levothyroxine  100 MCG tablet Commonly known as: SYNTHROID  Take 100 mcg by mouth daily.   omeprazole 20 MG capsule Commonly known as: PRILOSEC Take 20 mg by mouth daily as needed (Indigestion).   spironolactone  25 MG tablet Commonly known as: ALDACTONE  Take 0.5 tablets (12.5 mg total) by mouth daily.   sucralfate  1 g tablet Commonly known as: CARAFATE  Take 1 g by mouth 4 (four) times daily.        Discharge Exam: Filed Weights   11/06/23 1204  Weight: 54.4 kg      11/09/2023   11:01 AM 11/09/2023    8:52 AM 11/09/2023    7:41 AM  Vitals with  BMI  Systolic 123 132 867  Diastolic 56 58 58  Pulse 76  72    General - Elderly thin built African-American female, no apparent distress HEENT - PERRLA, EOMI, atraumatic head, non tender sinuses. Lung - Clear, no rales, rhonchi, wheezes. Heart - S1, S2 heard, no murmurs, rubs, no pedal edema. Abdomen - Soft, non tender, bowel sounds good Neuro - Alert, awake and oriented, right-sided weakness Skin - Warm and dry.    Condition at discharge: stable  The results of significant diagnostics from this hospitalization (including imaging, microbiology, ancillary and laboratory) are listed below for reference.   Imaging Studies: ECHOCARDIOGRAM COMPLETE Result Date: 11/07/2023    ECHOCARDIOGRAM REPORT   Patient Name:   NANCYJO GIVHAN Date of Exam: 11/07/2023 Medical Rec #:  [id number]      Height:       67.0 in Accession #:    [id number]     Weight:       120.0 lb Date of Birth:  02/12/1940       BSA:          1.628 m Patient Age:  83 years       BP:           156/66 mmHg Patient Gender: F              HR:           88 bpm. Exam Location:  Inpatient Procedure: 2D Echo, Cardiac Doppler and Color Doppler (Both Spectral and Color            Flow Doppler were utilized during procedure). Indications:    Stroke I63.9  History:        Patient has prior history of Echocardiogram examinations, most                 recent 11/01/2023. CHF, Previous Myocardial Infarction and CAD,                 Stroke and CKD, stage 3, Arrythmias:Atrial Flutter,                 Signs/Symptoms:Dyspnea, Shortness of Breath and Syncope; Risk                 Factors:Hypertension and Diabetes.  Sonographer:    Thea Norlander RCS Referring Phys: MAXIMINO DELENA SHARPS  Sonographer Comments: Image acquisition challenging due to patient body habitus. IMPRESSIONS  1. Left ventricular ejection fraction, by estimation, is 60 to 65%. The left ventricle has normal function. The left ventricle has no regional wall motion abnormalities. There is  moderate asymmetric left ventricular hypertrophy of the septal and basal segments. Left ventricular diastolic parameters are indeterminate.  2. Right ventricular systolic function is normal. The right ventricular size is normal. There is mildly elevated pulmonary artery systolic pressure. The estimated right ventricular systolic pressure is 36.9 mmHg.  3. The mitral valve is degenerative. There is nodular thickening at the leatlet tips and loose chordal structure as well. Mild mitral valve regurgitation.  4. The aortic valve is tricuspid. Aortic valve regurgitation is moderate.  5. The inferior vena cava is normal in size with greater than 50% respiratory variability, suggesting right atrial pressure of 3 mmHg. Comparison(s): Prior images reviewed side by side. LVEF 60-65%. Mild mitral regurgitation with degenerative valve. Aortic regurgitation is now moderate - no obvious vegetation. FINDINGS  Left Ventricle: Left ventricular ejection fraction, by estimation, is 60 to 65%. The left ventricle has normal function. The left ventricle has no regional wall motion abnormalities. The left ventricular internal cavity size was normal in size. There is  moderate asymmetric left ventricular hypertrophy of the septal and basal segments. Left ventricular diastolic parameters are indeterminate. Right Ventricle: The right ventricular size is normal. No increase in right ventricular wall thickness. Right ventricular systolic function is normal. There is mildly elevated pulmonary artery systolic pressure. The tricuspid regurgitant velocity is 2.91  m/s, and with an assumed right atrial pressure of 3 mmHg, the estimated right ventricular systolic pressure is 36.9 mmHg. Left Atrium: Left atrial size was normal in size. Right Atrium: Right atrial size was normal in size. Pericardium: There is no evidence of pericardial effusion. Presence of epicardial fat layer. Mitral Valve: The mitral valve is degenerative in appearance. Mild mitral  valve regurgitation. Tricuspid Valve: The tricuspid valve is grossly normal. Tricuspid valve regurgitation is mild. Aortic Valve: The aortic valve is tricuspid. There is mild aortic valve annular calcification. Aortic valve regurgitation is moderate. Aortic valve peak gradient measures 6.7 mmHg. Pulmonic Valve: The pulmonic valve was grossly normal. Pulmonic valve regurgitation is mild. Aorta: The aortic root and ascending  aorta are structurally normal, with no evidence of dilitation. Venous: The inferior vena cava is normal in size with greater than 50% respiratory variability, suggesting right atrial pressure of 3 mmHg. IAS/Shunts: No atrial level shunt detected by color flow Doppler. Additional Comments: 3D was performed not requiring image post processing on an independent workstation and was indeterminate.  LEFT VENTRICLE PLAX 2D LVIDd:         3.50 cm   Diastology LVIDs:         2.00 cm   LV e' medial:    4.35 cm/s LV PW:         0.90 cm   LV E/e' medial:  17.8 LV IVS:        0.90 cm   LV e' lateral:   9.25 cm/s LVOT diam:     2.20 cm   LV E/e' lateral: 8.4 LV SV:         60 LV SV Index:   37 LVOT Area:     3.80 cm  RIGHT VENTRICLE             IVC RV S prime:     17.90 cm/s  IVC diam: 1.00 cm TAPSE (M-mode): 1.4 cm LEFT ATRIUM             Index       RIGHT ATRIUM          Index LA diam:        2.30 cm 1.41 cm/m  RA Area:     5.05 cm LA Vol (A2C):   15.9 ml 9.77 ml/m  RA Volume:   4.44 ml  2.73 ml/m LA Vol (A4C):   8.8 ml  5.42 ml/m LA Biplane Vol: 11.0 ml 6.76 ml/m  AORTIC VALVE AV Area (Vmax): 2.54 cm AV Vmax:        129.00 cm/s AV Peak Grad:   6.7 mmHg LVOT Vmax:      86.20 cm/s LVOT Vmean:     57.500 cm/s LVOT VTI:       0.159 m  AORTA Ao Root diam: 3.10 cm Ao Asc diam:  3.20 cm MITRAL VALVE                TRICUSPID VALVE MV Area (PHT): 3.30 cm     TR Peak grad:   33.9 mmHg MV Decel Time: 230 msec     TR Vmax:        291.00 cm/s MV E velocity: 77.50 cm/s MV A velocity: 127.00 cm/s  SHUNTS MV E/A  ratio:  0.61         Systemic VTI:  0.16 m                             Systemic Diam: 2.20 cm Jayson Sierras MD Electronically signed by Jayson Sierras MD Signature Date/Time: 11/07/2023/12:29:19 PM    Final    MR BRAIN WO CONTRAST Result Date: 11/06/2023 EXAM: MR Brain without Intravenous Contrast. CLINICAL HISTORY: Stroke, follow up. Episodes of slurred speech yesterday. New onset right-sided weakness today. TECHNIQUE: Magnetic resonance images of the brain without intravenous contrast in multiple planes. CONTRAST: Without. COMPARISON: None provided. FINDINGS: BRAIN: No intracranial mass or hemorrhage. No midline shift or extra-axial fluid collection. No cerebellar tonsillar ectopia. The central arterial and venous flow voids are patent. Diffuse scattered punctate foci of restricted diffusion present throughout a watershed distribution bilaterally involving multiple small cortical infarcts over the convexities. Small  acute infarcts are present in the inferior right frontal lobe and right caudate head. Multiple cortical infarcts are present along the posterior parietal lobes and occipital lobes bilaterally. A single right lateral pontine infarct is present. Posterior cerebellar infarcts are present bilaterally as well. T2 and FLAIR hyperintensities. Additional confluent periventricular T2 hyperintensities are present bilaterally. VENTRICLES: No hydrocephalus. ORBITS: The orbits are normal. SINUSES AND MASTOIDS: The sinuses and mastoid air cells are clear. BONES: No acute fracture or focal osseous lesion. IMPRESSION: 1. Multiple acute infarcts in a bilateral watershed distribution involving the convexities, inferior right frontal lobe, right caudate head, posterior parietal and occipital lobes, right lateral pons, and posterior cerebellum. Findings suggest a central embolic source 2. Confluent periventricular T2/FLAIR hyperintensities bilaterally, suggestive of chronic microvascular ischemic change.  Electronically signed by: Lonni Necessary MD 11/06/2023 06:35 PM EDT RP Workstation: [id number]   MR ANGIO HEAD WO CONTRAST Result Date: 11/06/2023 EXAM: MR Angiography Head without intravenous Contrast. 11/06/2023 05:53:00 PM TECHNIQUE: Magnetic resonance angiography images of the head without intravenous contrast. Multiplanar 2D and 3D reformatted images are provided for review. COMPARISON: None provided. CLINICAL HISTORY: Neuro deficit, acute, stroke suspected. 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 Both A2 segments filled from the left. No significant stenosis of the internal carotid arteries. No significant stenosis of the middle cerebral arteries. No aneurysm. POSTERIOR CIRCULATION: The left vertebral artery is a dominant vessel. No significant stenosis of the posterior cerebral arteries. No significant stenosis of the basilar artery. No aneurysm. IMPRESSION: 1. No significant intracranial arterial stenosis or vessel occlusion Electronically signed by: Lonni Necessary MD 11/06/2023 06:29 PM EDT RP Workstation: [id number]   MR ANGIO NECK WO CONTRAST Result Date: 11/06/2023 EXAM: MRA Neck without 11/06/2023 05:20:00 PM TECHNIQUE: Multiplanar multisequence MRA of the neck was performed without the administration of intravenous contrast. 2D and 3D reformatted images are provided for review. Stenosis of the internal carotid arteries measured using NASCET criteria. COMPARISON: None available CLINICAL HISTORY: Neuro deficit, acute, stroke suspected. FINDINGS: AORTIC ARCH AND ARCH VESSELS: No dissection or arterial injury. No significant stenosis of the brachiocephalic or subclavian arteries. CAROTID ARTERIES: No dissection, arterial injury, or hemodynamically significant stenosis by NASCET criteria. No significant flow disturbance is demonstrated. VERTEBRAL ARTERIES: Flow is antegrade in the vertebral arteries bilaterally. No dissection, arterial injury, or significant  stenosis. IMPRESSION: 1. No evidence of dissection, arterial injury, or hemodynamically significant stenosis in the aortic arch, arch vessels, carotid arteries, or vertebral arteries. Electronically signed by: Lonni Necessary MD 11/06/2023 06:16 PM EDT RP Workstation: [id number]   CT HEAD WO CONTRAST Result Date: 11/06/2023 EXAM: CT HEAD WITHOUT CONTRAST 11/06/2023 01:36:00 PM TECHNIQUE: CT of the head was performed without the administration of intravenous contrast. Automated exposure control, iterative reconstruction, and/or weight based adjustment of the mA/kV was utilized to reduce the radiation dose to as low as reasonably achievable. COMPARISON: CT head without contrast 09/13/2023. CLINICAL HISTORY: Neuro deficit, acute, stroke suspected. Right lower extremity weakness. FINDINGS: BRAIN AND VENTRICLES: Her changes are stable and likely within normal limits for age. Atherosclerotic calcifications are present in the cavernous carotid arteries bilaterally and at the dural margin of both vertebral arteries. No hyperdense vessel is present. No acute hemorrhage. No evidence of acute infarct. No hydrocephalus. No extra-axial collection. No mass effect or midline shift. ORBITS: No acute abnormality. SINUSES: No acute abnormality. SOFT TISSUES AND SKULL: No acute soft tissue abnormality. No skull fracture. IMPRESSION: 1. No acute intracranial abnormality. Electronically signed by: Lonni Necessary MD 11/06/2023 01:50 PM  EDT RP Workstation: [id number]   ECHOCARDIOGRAM COMPLETE Result Date: 11/01/2023    ECHOCARDIOGRAM REPORT   Patient Name:   LAVENA LORETTO Date of Exam: 11/01/2023 Medical Rec #:  [id number]      Height:       67.0 in Accession #:    [id number]     Weight:       121.7 lb Date of Birth:  12-02-1940       BSA:          1.637 m Patient Age:    83 years       BP:           121/70 mmHg Patient Gender: F              HR:           54 bpm. Exam Location:  Inpatient Procedure: 2D Echo, Color Doppler  and Cardiac Doppler (Both Spectral and Color            Flow Doppler were utilized during procedure). Indications:    NSTEMI I21.4  History:        Patient has prior history of Echocardiogram examinations, most                 recent 11/01/2023. Risk Factors:Diabetes and Hypertension.  Sonographer:    Tinnie Gosling RDCS Referring Phys: 66 RHONDA G BARRETT  Sonographer Comments: Image acquisition challenging due to respiratory motion. IMPRESSIONS  1. Left ventricular ejection fraction, by estimation, is 60 to 65%. The left ventricle has normal function. The left ventricle has no regional wall motion abnormalities. Left ventricular diastolic parameters are consistent with Grade I diastolic dysfunction (impaired relaxation).  2. Right ventricular systolic function is normal. The right ventricular size is normal. There is normal pulmonary artery systolic pressure. The estimated right ventricular systolic pressure is 33.7 mmHg.  3. The mitral valve is degenerative. No evidence of mitral valve regurgitation. No evidence of mitral stenosis.  4. The aortic valve is calcified. There is mild calcification of the aortic valve. Aortic valve regurgitation is trivial. Aortic valve sclerosis is present, with no evidence of aortic valve stenosis.  5. The inferior vena cava is normal in size with greater than 50% respiratory variability, suggesting right atrial pressure of 3 mmHg. FINDINGS  Left Ventricle: Left ventricular ejection fraction, by estimation, is 60 to 65%. The left ventricle has normal function. The left ventricle has no regional wall motion abnormalities. The left ventricular internal cavity size was normal in size. There is  no left ventricular hypertrophy. Left ventricular diastolic parameters are consistent with Grade I diastolic dysfunction (impaired relaxation). Right Ventricle: The right ventricular size is normal. No increase in right ventricular wall thickness. Right ventricular systolic function is normal.  There is normal pulmonary artery systolic pressure. The tricuspid regurgitant velocity is 2.77 m/s, and  with an assumed right atrial pressure of 3 mmHg, the estimated right ventricular systolic pressure is 33.7 mmHg. Left Atrium: Left atrial size was normal in size. Right Atrium: Right atrial size was normal in size. Pericardium: There is no evidence of pericardial effusion. Mitral Valve: The mitral valve is degenerative in appearance. There is mild thickening of the mitral valve leaflet(s). There is mild calcification of the mitral valve leaflet(s). No evidence of mitral valve regurgitation. No evidence of mitral valve stenosis. Tricuspid Valve: The tricuspid valve is normal in structure. Tricuspid valve regurgitation is trivial. No evidence of tricuspid stenosis. Aortic Valve: The aortic valve is calcified. There is  mild calcification of the aortic valve. Aortic valve regurgitation is trivial. Aortic valve sclerosis is present, with no evidence of aortic valve stenosis. Pulmonic Valve: The pulmonic valve was normal in structure. Pulmonic valve regurgitation is not visualized. No evidence of pulmonic stenosis. Aorta: The aortic root is normal in size and structure. Venous: The inferior vena cava is normal in size with greater than 50% respiratory variability, suggesting right atrial pressure of 3 mmHg. IAS/Shunts: No atrial level shunt detected by color flow Doppler.  LEFT VENTRICLE PLAX 2D LVIDd:         4.20 cm   Diastology LVIDs:         2.10 cm   LV e' medial:    4.35 cm/s LV PW:         1.10 cm   LV E/e' medial:  13.5 LV IVS:        1.00 cm   LV e' lateral:   5.11 cm/s LVOT diam:     2.00 cm   LV E/e' lateral: 11.5 LV SV:         50 LV SV Index:   31 LVOT Area:     3.14 cm  RIGHT VENTRICLE             IVC RV S prime:     10.20 cm/s  IVC diam: 1.60 cm TAPSE (M-mode): 1.1 cm LEFT ATRIUM             Index LA diam:        2.70 cm 1.65 cm/m LA Vol (A2C):   33.4 ml 20.40 ml/m LA Vol (A4C):   31.5 ml 19.24 ml/m  LA Biplane Vol: 34.9 ml 21.31 ml/m  AORTIC VALVE             PULMONIC VALVE LVOT Vmax:   62.50 cm/s  PR End Diast Vel: 5.02 msec LVOT Vmean:  41.200 cm/s LVOT VTI:    0.159 m  AORTA Ao Root diam: 3.20 cm Ao Asc diam:  3.30 cm MITRAL VALVE                TRICUSPID VALVE MV Area (PHT): 1.93 cm     TR Peak grad:   30.7 mmHg MV Decel Time: 394 msec     TR Vmax:        277.00 cm/s MV E velocity: 58.80 cm/s MV A velocity: 107.00 cm/s  SHUNTS MV E/A ratio:  0.55         Systemic VTI:  0.16 m                             Systemic Diam: 2.00 cm Oneil Parchment MD Electronically signed by Oneil Parchment MD Signature Date/Time: 11/01/2023/1:29:48 PM    Final    CARDIAC CATHETERIZATION Result Date: 11/01/2023 1.  Patent left main with mild calcified plaquing 2.  Patent LAD with diffuse nonobstructive calcified plaquing 3.  Large, dominant circumflex with moderately severe stenosis at the distal vessel involving the origin of a small left PDA branch 4.  Small, nondominant RCA with proximal occlusion and left-to-right collaterals supplied by the distal circumflex 5.  Normal LVEDP Recommendations: Medical therapy.  No targets for PCI.   DG Chest 2 View Result Date: 10/30/2023 CLINICAL DATA:  Productive cough. EXAM: CHEST - 2 VIEW COMPARISON:  10/21/2023 FINDINGS: Lungs are hyperexpanded. Bibasilar chronic atelectasis or scarring is similar to prior. Superimposed lesion at the left base compatible with known  hypermetabolic lesion seen on PET-CT 07/15/2023. No focal airspace consolidation, pneumothorax, or substantial pleural effusion. Cardiopericardial silhouette is at upper limits of normal for size. No acute bony abnormality. Telemetry leads overlie the chest. IMPRESSION: Hyperexpansion with bibasilar chronic atelectasis or scarring. No acute cardiopulmonary findings. Known hypermetabolic lesion left lower lobe better characterized on previous PET imaging. Electronically Signed   By: Camellia Candle M.D.   On: 10/30/2023 11:47    DG Chest 2 View Result Date: 10/26/2023 CLINICAL DATA:  Lung cancer EXAM: CHEST - 2 VIEW COMPARISON:  Chest radiograph 10/19/2023 and 09/28/2023 FINDINGS: Cardiomediastinal silhouette and pulmonary vasculature are within normal limits. Lungs are hyperexpanded. Known LEFT basilar malignancy does not appear significantly changed. Linear opacities the RIGHT lung base are unchanged and likely due to scarring. No new focal airspace opacity identified to indicate pneumonia. IMPRESSION: 1. No focal airspace opacity to indicate pneumonia. 2. Known LEFT basilar malignancy is similar in appearance compared to recent chest radiographs. Electronically Signed   By: Aliene Lloyd M.D.   On: 10/26/2023 13:36   DG Chest 2 View Result Date: 10/19/2023 CLINICAL DATA:  Cough.  History of lung cancer. EXAM: CHEST - 2 VIEW COMPARISON:  Multiple prior imaging studies. The most recent chest x-rays 09/28/2023. FINDINGS: The cardiac silhouette, mediastinal and hilar contours are within normal limits and stable. Stable tortuosity and calcification of the thoracic aorta. Ill-defined opacity at the left lung base but not as pronounced on the prior study which may represent treatment response. Underlying emphysematous changes and pulmonary scarring. No pulmonary edema or pleural effusion. Improved right basilar aeration suggesting resolving infiltrates. No significant bony findings. IMPRESSION: 1. Ill-defined opacity at the left lung base but not as pronounced on the prior study which may represent treatment response. 2. Improved right basilar aeration suggesting resolving infiltrate. Electronically Signed   By: MYRTIS Stammer M.D.   On: 10/19/2023 16:11    Microbiology: Results for orders placed or performed during the hospital encounter of 10/19/23  Urine Culture     Status: Abnormal   Collection Time: 10/19/23  4:00 PM   Specimen: Urine, Clean Catch  Result Value Ref Range Status   Specimen Description URINE, CLEAN CATCH   Final   Special Requests   Final    NONE Performed at [facility], [address], [place], [place] [address]    Culture >=100,000 COLONIES/mL ESCHERICHIA COLI (A)  Final   Report Status 10/21/2023 FINAL  Final   Organism ID, Bacteria ESCHERICHIA COLI (A)  Final      Susceptibility   Escherichia coli - MIC*    AMPICILLIN >=32 RESISTANT Resistant     CEFAZOLIN  (URINE) Value in next row Sensitive      <=1 SENSITIVEThis is a modified FDA-approved test that has been validated and its performance characteristics determined by the reporting laboratory.  This laboratory is certified under the Clinical Laboratory Improvement Amendments CLIA as qualified to perform high complexity clinical laboratory testing.    CEFEPIME Value in next row Sensitive      <=1 SENSITIVEThis is a modified FDA-approved test that has been validated and its performance characteristics determined by the reporting laboratory.  This laboratory is certified under the Clinical Laboratory Improvement Amendments CLIA as qualified to perform high complexity clinical laboratory testing.    ERTAPENEM Value in next row Sensitive      <=1 SENSITIVEThis is a modified FDA-approved test that has been validated and its performance characteristics determined by the reporting laboratory.  This laboratory  is certified under the Clinical Laboratory Improvement Amendments CLIA as qualified to perform high complexity clinical laboratory testing.    CEFTRIAXONE  Value in next row Sensitive      <=1 SENSITIVEThis is a modified FDA-approved test that has been validated and its performance characteristics determined by the reporting laboratory.  This laboratory is certified under the Clinical Laboratory Improvement Amendments CLIA as qualified to perform high complexity clinical laboratory testing.    CIPROFLOXACIN Value in next row Sensitive      <=1 SENSITIVEThis is a modified FDA-approved test that has been validated and its performance  characteristics determined by the reporting laboratory.  This laboratory is certified under the Clinical Laboratory Improvement Amendments CLIA as qualified to perform high complexity clinical laboratory testing.    GENTAMICIN Value in next row Sensitive      <=1 SENSITIVEThis is a modified FDA-approved test that has been validated and its performance characteristics determined by the reporting laboratory.  This laboratory is certified under the Clinical Laboratory Improvement Amendments CLIA as qualified to perform high complexity clinical laboratory testing.    NITROFURANTOIN Value in next row Sensitive      <=1 SENSITIVEThis is a modified FDA-approved test that has been validated and its performance characteristics determined by the reporting laboratory.  This laboratory is certified under the Clinical Laboratory Improvement Amendments CLIA as qualified to perform high complexity clinical laboratory testing.    TRIMETH/SULFA Value in next row Sensitive      <=1 SENSITIVEThis is a modified FDA-approved test that has been validated and its performance characteristics determined by the reporting laboratory.  This laboratory is certified under the Clinical Laboratory Improvement Amendments CLIA as qualified to perform high complexity clinical laboratory testing.    AMPICILLIN/SULBACTAM Value in next row Sensitive      <=1 SENSITIVEThis is a modified FDA-approved test that has been validated and its performance characteristics determined by the reporting laboratory.  This laboratory is certified under the Clinical Laboratory Improvement Amendments CLIA as qualified to perform high complexity clinical laboratory testing.    PIP/TAZO Value in next row Sensitive      <=4 SENSITIVEThis is a modified FDA-approved test that has been validated and its performance characteristics determined by the reporting laboratory.  This laboratory is certified under the Clinical Laboratory Improvement Amendments CLIA as  qualified to perform high complexity clinical laboratory testing.    MEROPENEM Value in next row Sensitive      <=4 SENSITIVEThis is a modified FDA-approved test that has been validated and its performance characteristics determined by the reporting laboratory.  This laboratory is certified under the Clinical Laboratory Improvement Amendments CLIA as qualified to perform high complexity clinical laboratory testing.    * >=100,000 COLONIES/mL ESCHERICHIA COLI    Labs: CBC: Recent Labs  Lab 11/06/23 1258 11/07/23 0653 11/08/23 0206 11/09/23 0115  WBC 4.8 4.8 4.8 4.9  NEUTROABS 3.9  --   --   --   HGB 10.7* 9.9* 9.8* 9.7*  HCT 33.4* 30.0* 29.5* 29.1*  MCV 88.4 86.5 85.8 86.6  PLT 123* 117* 118* 160   Basic Metabolic Panel: Recent Labs  Lab 11/06/23 1258  NA 133*  K 3.8  CL 99  CO2 24  GLUCOSE 95  BUN 10  CREATININE 1.01*  CALCIUM  9.4   Liver Function Tests: Recent Labs  Lab 11/06/23 1258  AST 32  ALT 20  ALKPHOS 64  BILITOT 0.5  PROT 8.0  ALBUMIN 3.7   CBG: Recent Labs  Lab 11/06/23 1229  11/07/23 1533  GLUCAP 76 109*    Discharge time spent: 35 minutes.  Signed: Concepcion Riser, MD [facility] 11/09/2023

## 2023-11-10 ENCOUNTER — Telehealth (HOSPITAL_COMMUNITY): Payer: Self-pay

## 2023-11-10 DIAGNOSIS — M25511 Pain in right shoulder: Secondary | ICD-10-CM

## 2023-11-10 DIAGNOSIS — E1169 Type 2 diabetes mellitus with other specified complication: Secondary | ICD-10-CM

## 2023-11-10 DIAGNOSIS — I10 Essential (primary) hypertension: Secondary | ICD-10-CM | POA: Diagnosis not present

## 2023-11-10 DIAGNOSIS — G8191 Hemiplegia, unspecified affecting right dominant side: Secondary | ICD-10-CM | POA: Diagnosis not present

## 2023-11-10 DIAGNOSIS — K5900 Constipation, unspecified: Secondary | ICD-10-CM

## 2023-11-10 DIAGNOSIS — Z794 Long term (current) use of insulin: Secondary | ICD-10-CM

## 2023-11-10 DIAGNOSIS — I639 Cerebral infarction, unspecified: Secondary | ICD-10-CM

## 2023-11-10 LAB — COMPREHENSIVE METABOLIC PANEL WITH GFR
ALT: 37 U/L (ref 0–44)
AST: 51 U/L — ABNORMAL HIGH (ref 15–41)
Albumin: 3.1 g/dL — ABNORMAL LOW (ref 3.5–5.0)
Alkaline Phosphatase: 69 U/L (ref 38–126)
Anion gap: 9 (ref 5–15)
BUN: 15 mg/dL (ref 8–23)
CO2: 23 mmol/L (ref 22–32)
Calcium: 9.1 mg/dL (ref 8.9–10.3)
Chloride: 99 mmol/L (ref 98–111)
Creatinine, Ser: 0.91 mg/dL (ref 0.44–1.00)
GFR, Estimated: >60 mL/min (ref >60)
Glucose, Bld: 107 mg/dL — ABNORMAL HIGH (ref 70–99)
Potassium: 4.3 mmol/L (ref 3.5–5.1)
Sodium: 131 mmol/L — ABNORMAL LOW (ref 135–145)
Total Bilirubin: 0.7 mg/dL (ref 0.0–1.2)
Total Protein: 7 g/dL (ref 6.5–8.1)

## 2023-11-10 LAB — CBC WITH DIFFERENTIAL/PLATELET
Abs Immature Granulocytes: 0.03 K/uL (ref 0.00–0.07)
Basophils Absolute: 0 K/uL (ref 0.0–0.1)
Basophils Relative: 1 %
Eosinophils Absolute: 0.2 K/uL (ref 0.0–0.5)
Eosinophils Relative: 5 %
HCT: 29.5 % — ABNORMAL LOW (ref 36.0–46.0)
Hemoglobin: 9.8 g/dL — ABNORMAL LOW (ref 12.0–15.0)
Immature Granulocytes: 1 %
Lymphocytes Relative: 10 %
Lymphs Abs: 0.5 K/uL — ABNORMAL LOW (ref 0.7–4.0)
MCH: 28.7 pg (ref 26.0–34.0)
MCHC: 33.2 g/dL (ref 30.0–36.0)
MCV: 86.3 fL (ref 80.0–100.0)
Monocytes Absolute: 0.5 K/uL (ref 0.1–1.0)
Monocytes Relative: 10 %
Neutro Abs: 3.7 K/uL (ref 1.7–7.7)
Neutrophils Relative %: 73 %
Platelets: 147 K/uL — ABNORMAL LOW (ref 150–400)
RBC: 3.42 MIL/uL — ABNORMAL LOW (ref 3.87–5.11)
RDW: 17.2 % — ABNORMAL HIGH (ref 11.5–15.5)
WBC: 5 K/uL (ref 4.0–10.5)
nRBC: 0 % (ref 0.0–0.2)

## 2023-11-10 LAB — GLUCOSE, CAPILLARY
Glucose-Capillary: 101 mg/dL — ABNORMAL HIGH (ref 70–99)
Glucose-Capillary: 99 mg/dL (ref 70–99)

## 2023-11-10 MED ORDER — DICLOFENAC SODIUM 1 % EX GEL
2.0000 g | Freq: Four times a day (QID) | CUTANEOUS | Status: DC
  Administered 2023-11-11 – 2023-12-09 (×101): 2 g via TOPICAL
  Filled 2023-11-10 (×2): qty 100

## 2023-11-10 MED ORDER — INSULIN ASPART 100 UNIT/ML IJ SOLN
0.0000 [IU] | Freq: Three times a day (TID) | INTRAMUSCULAR | Status: DC
  Administered 2023-11-21 – 2023-11-30 (×3): 1 [IU] via SUBCUTANEOUS
  Filled 2023-11-10: qty 2
  Filled 2023-11-10: qty 1

## 2023-11-10 NOTE — Discharge Instructions (Addendum)
 Inpatient Rehab Discharge Instructions  [name] Discharge date and time:    Activities/Precautions/ Functional Status: Activity: activity as tolerated Diet: Dysphagia 3 Wound Care:  --santyl with damp to dry dressing on sacral wound. Change daily.    Functional status:  ___ No restrictions     ___ Walk up steps independently ___ 24/7 supervision/assistance   ___ Walk up steps with assistance ___ Intermittent supervision/assistance  ___ Bathe/dress independently ___ Walk with walker     ___ Bathe/dress with assistance ___ Walk Independently    ___ Shower independently ___ Walk with assistance    ___ Shower with assistance ___ No alcohol      ___ Return to work/school ________   Special Instructions:  -Repeat CBC in 1 week. If stable and no nose bleeding/other bleeding, resume Eliquis  2.5 mg bid     STROKE/TIA DISCHARGE INSTRUCTIONS SMOKING Cigarette smoking nearly doubles your risk of having a stroke & is the single most alterable risk factor  If you smoke or have smoked in the last 12 months, you are advised to quit smoking for your health. Most of the excess cardiovascular risk related to smoking disappears within a year of stopping. Ask you doctor about anti-smoking medications [place] Quit Line: 1-800-QUIT NOW Free Smoking Cessation Classes [phone]  CHOLESTEROL Know your levels; limit fat & cholesterol in your diet  Lipid Panel     Component Value Date/Time   CHOL 139 11/07/2023 0653   TRIG 103 11/07/2023 0653   HDL 44 11/07/2023 0653   CHOLHDL 3.2 11/07/2023 0653   VLDL 21 11/07/2023 0653   LDLCALC 74 11/07/2023 0653     Many patients benefit from treatment even if their cholesterol is at goal. Goal: Total Cholesterol (CHOL) less than 160 Goal:  Triglycerides (TRIG) less than 150 Goal:  HDL greater than 40 Goal:  LDL (LDLCALC) less than 100   BLOOD PRESSURE American Stroke Association blood pressure target is less that 120/80 mm/Hg  Your discharge blood  pressure is:  BP: (!) 142/52 Monitor your blood pressure Limit your salt and alcohol  intake Many individuals will require more than one medication for high blood pressure  DIABETES (A1c is a blood sugar average for last 3 months) Goal HGBA1c is under 7% (HBGA1c is blood sugar average for last 3 months)  Diabetes:     Lab Results  Component Value Date   HGBA1C 6.0 (H) 11/06/2023    Your HGBA1c can be lowered with medications, healthy diet, and exercise. Check your blood sugar as directed by your physician Call your physician if you experience unexplained or low blood sugars.  PHYSICAL ACTIVITY/REHABILITATION Goal is 30 minutes at least 4 days per week  Activity: Increase activity slowly, and No driving, Therapies: at SNF Return to work: N/A Activity decreases your risk of heart attack and stroke and makes your heart stronger.  It helps control your weight and blood pressure; helps you relax and can improve your mood. Participate in a regular exercise program. Talk with your doctor about the best form of exercise for you (dancing, walking, swimming, cycling).  DIET/WEIGHT Goal is to maintain a healthy weight  Your discharge diet is:  Diet Order             DIET DYS 3 Room service appropriate? Yes; Fluid consistency: Thin  Diet effective now                   liquids Your height is:  Height: 5' 6.5 (168.9 cm) Your  current weight is: Weight: 55 kg Your Body Mass Index (BMI) is:  BMI (Calculated): 19.28 Following the type of diet specifically designed for you will help prevent another stroke. You are at goal weight   Your goal Body Mass Index (BMI) is 19-24. Healthy food habits can help reduce 3 risk factors for stroke:  High cholesterol, hypertension, and excess weight.  RESOURCES Stroke/Support Group:  Call [phone]   STROKE EDUCATION PROVIDED/REVIEWED AND GIVEN TO PATIENT Stroke warning signs and symptoms How to activate emergency medical system (call 911). Medications  prescribed at discharge. Need for follow-up after discharge. Personal risk factors for stroke. Pneumonia vaccine given:  Flu vaccine given:  My questions have been answered, the writing is legible, and I understand these instructions.  I will adhere to these goals & educational materials that have been provided to me after my discharge from the hospital.   COMMUNITY REFERRALS UPON DISCHARGE:    Discharging to [facility] SN[facility]  My questions have been answered and I understand these instructions. I will adhere to these goals and the provided educational materials after my discharge from the hospital.  Patient/Caregiver Signature _______________________________ Date __________  Clinician Signature _______________________________________ Date __________  Please bring this form and your medication list with you to all your follow-up doctor's appointments.      Information on my medicine - ELIQUIS  (apixaban )  This medication education was reviewed with me or my healthcare representative as part of my discharge preparation.    Why was Eliquis  prescribed for you? Eliquis  was prescribed for you to reduce the risk of a blood clot forming that can cause a stroke if you have a medical condition called atrial fibrillation (a type of irregular heartbeat).  What do You need to know about Eliquis  ? Take your Eliquis  TWICE DAILY - one tablet in the morning and one tablet in the evening with or without food. If you have difficulty swallowing the tablet whole please discuss with your pharmacist how to take the medication safely.  Take Eliquis  exactly as prescribed by your doctor and DO NOT stop taking Eliquis  without talking to the doctor who prescribed the medication.  Stopping may increase your risk of developing a stroke.  Refill your prescription before you run out.  After discharge, you should have regular check-up appointments with your healthcare provider that is  prescribing your Eliquis .  In the future your dose may need to be changed if your kidney function or weight changes by a significant amount or as you get older.  What do you do if you miss a dose? If you miss a dose, take it as soon as you remember on the same day and resume taking twice daily.  Do not take more than one dose of ELIQUIS  at the same time to make up a missed dose.  Important Safety Information A possible side effect of Eliquis  is bleeding. You should call your healthcare provider right away if you experience any of the following: Bleeding from an injury or your nose that does not stop. Unusual colored urine (red or dark brown) or unusual colored stools (red or black). Unusual bruising for unknown reasons. A serious fall or if you hit your head (even if there is no bleeding).  Some medicines may interact with Eliquis  and might increase your risk of bleeding or clotting while on Eliquis . To help avoid this, consult your healthcare provider or pharmacist prior to using any new prescription or non-prescription medications, including herbals, vitamins, non-steroidal anti-inflammatory  drugs (NSAIDs) and supplements.  This website has more information on Eliquis  (apixaban ): http://[email or web address] .[email or web address] dena

## 2023-11-10 NOTE — Patient Care Conference (Signed)
 Inpatient RehabilitationTeam Conference and Plan of Care Update Date: 11/10/2023   Time: 09:57 AM   Patient Name: [name]      Medical Record Number: [id number]  Date of Birth: Apr 04, 1940 Sex: Female         Room/Bed: 4M09C/4M09C-01 Payor Info: Payor: [employer]vertising copywriter MEDICARE / Plan: DREMA DUAL COMPLETE / Product Type: *No Product type* /    Admit Date/Time:  11/09/2023  2:30 PM  Primary Diagnosis:  Acute right hemiparesis [facility])  Hospital Problems: Principal Problem:   Acute right hemiparesis (HCC) Active Problems:   Cerebellar stroke, acute [facility])    Expected Discharge Date: Expected Discharge Date:  (evals pending)  Team Members Present: Physician leading conference: Dr. Brenn Collier Nurse Present: Barnie Ronde, RN PT Present: [name], PT PPS Coordinator present : Eleanor Colon, SLP     Current Status/Progress Goal Weekly Team Focus  Bowel/Bladder   Pt is incontinent of bowel and bladder.   Try to keep toilet schedule to minimize episodes of incontinency.   Will be working toward [employer].    Swallow/Nutrition/ Hydration               ADL's     Evals pending           Mobility      Evals pending         Communication                Safety/Cognition/ Behavioral Observations               Pain   Pt denies pain and discomfort.   Ensure pt's pain level is below 2.   Assess for pain to keep pt in comfort.    Skin   Skin is intact.W   will keep skin integrity intact, Pt will be cleaned and kept dry to prevent skin breakdown.  Encourage pt for turns, toilets.      Discharge Planning:    Home with dtr, 1 level, 1ste , bil. rails, has DME.  Team Discussion: Patient admitted post bilateral watershed CVA; right frontal, caudate, parietal, occipital,pons and PCA CVA. With dysarthria and right sided weakness.  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 Recent  NSTEMI.  Patient on target to meet rehab goals: Evals pending  *See Care Plan and progress notes for long and short-term goals.   Revisions to Treatment Plan:  N/a   Teaching Needs: Safety, medications, dietary modification, transfers, toileting, etc.   Current Barriers to Discharge: Decreased caregiver support and Home enviroment access/layout  Possible Resolutions to Barriers: Family education     Medical Summary Current Status: CVA, HTN, lung adenocarcinoma, Anemia, constipation,DM2, HTN, hx SLE  Barriers to Discharge: Medical stability;Incontinence;Self-care education  Barriers to Discharge Comments: CVA, HTN, lung adenocarcinoma, Anemia, constipation,DM2, HTN, hx SLE Possible Resolutions to [name] Focus: monitor BP, laxatives, monitor glucose   Continued Need for Acute Rehabilitation Level of Care: The patient requires daily medical management by a physician with specialized training in physical medicine and rehabilitation for the following reasons: Direction of a multidisciplinary physical rehabilitation program to maximize functional independence : Yes Medical management of patient stability for increased activity during participation in an intensive rehabilitation regime.: Yes Analysis of laboratory values and/or radiology reports with any subsequent need for medication adjustment and/or medical intervention. : Yes   I attest that I was present, lead the team conference, and concur with the assessment and plan of the team.  [name] 11/10/2023, 4:00 PM

## 2023-11-10 NOTE — Telephone Encounter (Signed)
 Pt is currently admitted in the hospital, will contact pt on a later date to see if she is interested in the cardiac rehab program.

## 2023-11-10 NOTE — Evaluation (Signed)
 Speech Language Pathology Assessment and Plan  Patient Details  Name: [name] MRN: [id number] Date of Birth: 11/03/1940  SLP Diagnosis: Dysarthria;Dysphagia;Cognitive Impairments  Rehab Potential: Good ELOS: 12-15 days    Today's Date: 11/10/2023 SLP Individual Time: 9153-9054 SLP Individual Time Calculation (min): 59 min   Hospital Problem: Principal Problem:   Acute right hemiparesis (HCC) Active Problems:   Cerebellar stroke, acute (HCC)  Past Medical History:  Past Medical History:  Diagnosis Date   Acute bronchitis 12/17/2021   Acute cystitis 02/20/2019   Arthritis    Atrial flutter (HCC)    Breast cancer (HCC)    left  2009 and 2025   Diabetes mellitus without complication (HCC)    Dyspnea    mostly with exertion   GERD (gastroesophageal reflux disease)    History of radiation therapy    Left Lung-08/09/23-10/01/23- Dr. 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[facility])    Lupus    Pneumonia 07/31/2021   Past Surgical History:  Past Surgical History:  Procedure Laterality Date   BREAST BIOPSY Left 01/15/2023   US  LT BREAST BX W LOC DEV 1ST LESION IMG BX SPEC US  GUIDE 01/15/2023 [facility]   BREAST LUMPECTOMY Left 2009   BRONCHIAL BIOPSY  06/29/2023   Procedure: BRONCHOSCOPY, WITH BIOPSY;  Surgeon: [name], MD;  Location: [facility] ENDOSCOPY;  Service: Pulmonary;;   BRONCHIAL BRUSHINGS  06/29/2023   Procedure: BRONCHOSCOPY, WITH BRUSH BIOPSY;  Surgeon: [name], MD;  Location: [facility] ENDOSCOPY;  Service: Pulmonary;;   BRONCHIAL NEEDLE ASPIRATION BIOPSY  06/29/2023   Procedure: BRONCHOSCOPY, WITH NEEDLE ASPIRATION BIOPSY;  Surgeon: [name], MD;  Location: [facility] ENDOSCOPY;  Service: Pulmonary;;   IR 3D INDEPENDENT WKST  05/29/2021   IR ANGIO INTRA EXTRACRAN SEL COM CAROTID INNOMINATE BILAT MOD SED  05/29/2021   IR ANGIO VERTEBRAL SEL VERTEBRAL BILAT MOD SED  05/29/2021   LEFT HEART CATH AND CORONARY ANGIOGRAPHY  N/A 11/01/2023   Procedure: LEFT HEART CATH AND CORONARY ANGIOGRAPHY;  Surgeon: Wonda Sharper, MD;  Location: [facility] INVASIVE CV LAB;  Service: Cardiovascular;  Laterality: N/A;   SIMPLE MASTECTOMY WITH AXILLARY SENTINEL NODE BIOPSY Left 03/31/2023   Procedure: LEFT MASTECTOMY;  Surgeon: Vernetta Berg, MD;  Location: [facility] OR;  Service: General;  Laterality: Left;  LMA PEC BLOCK   VIDEO BRONCHOSCOPY WITH ENDOBRONCHIAL NAVIGATION Left 06/29/2023   Procedure: VIDEO BRONCHOSCOPY WITH ENDOBRONCHIAL NAVIGATION;  Surgeon: [name], MD;  Location: [facility] ENDOSCOPY;  Service: Pulmonary;  Laterality: Left;   VIDEO BRONCHOSCOPY WITH ENDOBRONCHIAL ULTRASOUND Bilateral 06/29/2023   Procedure: BRONCHOSCOPY, WITH EBUS;  Surgeon: [name], MD;  Location: [facility] ENDOSCOPY;  Service: Pulmonary;  Laterality: Bilateral;    Assessment / Plan / Recommendation Clinical Impression HPI: [name] is a 83 y.o. 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     CT of the head revealed large hypodensity on the right concerning for large territory infarct.  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  Stroke consistent with embolic.  Patient apparently missed Eliquis  doses. Echo shows EF 60%, increased RVSP at 36.9 mmHg, mild MR, moderate  AR. Continue aspirin , statin.  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 S/p radiation, on anastrozole .She is at risk for strokes due to her cancer history. Therapies are recommending intensive rehab.   Clinical Impression:  Bedside Swallow  Evaluation: A bedside swallow evaluation was completed to assess for s/sx of oropharyngeal dysphagia. Oral mechanism exam revealed edentulous state and reduced labial seal resulting in occasionally prolonged mastication of solids and intermittent anterior spill of liquids. POs administered included thin liquids, puree and solids. Patient with mildly prolonged mastication though complete oral clearance. Noted occasional aphonia after thin liquids and x1 cough after thin liquids during medication administration. Recommend continuation of current diet with medications whole in puree. Patient should use of standardized precautions including sitting upright during PO and taking small bites/sips at a slow rate. Recommend intermittent supervision during mealtimes.  Cognitive-Linguistic:  Patient was evaluated via formal and informal measures to assess cognitive linguistic skills. Patient with strengths in orientation, expressive/receptive language and attention. Patient with moderate deficits in short term memory, problem solving and processing speed. Patient is independently aware of current medical situation and changes in speech. Patient is approximately 70% intelligibile with intelligibility decreasing as session progressed. Intelligibility is reduced secondary to poor respiratory support, processing time and labial/generalized oral weakness. Recommend targeting speech, processing time, memory and problem solving skills during inpatient rehabilitation stay.  Pt would benefit from skilled ST services to maximize speech intelligibility, cognition and dysphagia in order to maximize functional independence at d/c. Anticipate patient will require f/u SLP services.    Skilled Therapeutic Interventions          Patient evaluated using a standardized cognitive linguistic assessment and bedside swallow evaluation to assess current cognitive, communicative and swallowing function. See above for details.    SLP Assessment   Patient will need skilled Speech Lanaguage Pathology Services during CIR admission    Recommendations  SLP Diet Recommendations: Age appropriate regular solids;Thin Liquid Administration via: Spoon;Straw Medication Administration: Whole meds with puree Supervision: Intermittent supervision to cue for compensatory strategies Compensations: Slow rate;Small sips/bites Postural Changes and/or Swallow Maneuvers: Seated upright 90 degrees Oral Care Recommendations: Oral care BID Patient destination: Home Follow up Recommendations: Home Health SLP;Outpatient SLP Equipment Recommended: None recommended by SLP    SLP Frequency 3 to 5 out of 7 days   SLP Duration  SLP Intensity  SLP Treatment/Interventions 12-15 days  Minumum of 1-2 x/day, 30 to 90 minutes  Cognitive remediation/compensation;Dysphagia/aspiration precaution training;Internal/external aids;Speech/Language facilitation;Therapeutic Activities;Functional tasks;Multimodal communication approach;Patient/family education    Pain None reported   SLP Evaluation Cognition Overall Cognitive Status: Impaired/Different from baseline Arousal/Alertness: Awake/alert Orientation Level: Oriented X4 Year: 2025 Month: October Day of Week: Correct Attention: Sustained Sustained Attention: Appears intact Memory: Impaired Memory Impairment: Decreased short term memory Decreased Short Term Memory: Verbal basic;Functional basic Awareness: Appears intact Problem Solving: Impaired Problem Solving Impairment: Verbal complex;Functional complex Safety/Judgment: Appears intact  Comprehension Auditory Comprehension Overall Auditory Comprehension: Appears within functional limits for tasks assessed Yes/No Questions: Within Functional Limits Commands: Within Functional Limits Conversation: Simple Interfering Components: Processing speed EffectiveTechniques: Extra processing time Expression Expression Primary Mode of Expression:  Verbal Verbal Expression Overall Verbal Expression: Impaired Initiation: Impaired (processing speed) Automatic Speech: Name;Social Response Level of Generative/Spontaneous Verbalization: Conversation Repetition: No impairment Naming: No impairment Interfering Components: Speech intelligibility Oral Motor Oral Motor/Sensory Function Overall Oral Motor/Sensory Function: Mild impairment (reduced labial strength) Facial ROM: Within Functional Limits Facial Symmetry: Within Functional Limits Facial Strength: Reduced right Lingual ROM: Within Functional Limits  Velum: Within Functional Limits Mandible: Within Functional Limits Motor Speech Overall Motor Speech: Impaired Respiration: Impaired Level of Impairment: Word Phonation: Low vocal intensity Resonance: Within functional limits Articulation: Impaired Level of Impairment: Word Intelligibility: Intelligibility reduced Word: 50-74% accurate Phrase: 25-49% accurate Sentence: 25-49% accurate Motor Planning: Within functional limits Effective Techniques: Increased vocal intensity;Over-articulate  Care Tool Care Tool Cognition Ability to hear (with hearing aid or hearing appliances if normally used Ability to hear (with hearing aid or hearing appliances if normally used): 0. Adequate - no difficulty in normal conservation, social interaction, listening to TV   Expression of Ideas and Wants Expression of Ideas and Wants: 3. Some difficulty - exhibits some difficulty with expressing needs and ideas (e.g, some words or finishing thoughts) or speech is not clear   Understanding Verbal and Non-Verbal Content Understanding Verbal and Non-Verbal Content: 3. Usually understands - understands most conversations, but misses some part/intent of message. Requires cues at times to understand  Memory/Recall Ability Memory/Recall Ability : Current season;That he or she is in a hospital/hospital unit    Bedside Swallowing  Assessment General Previous Swallow Assessment: none Diet Prior to this Study: Regular;Thin liquids (Level 0) Respiratory Status: Room air Behavior/Cognition: Alert;Cooperative Oral Cavity - Dentition: Edentulous Patient Positioning: Upright in bed Volitional Cough: Weak Volitional Swallow: Able to elicit  Ice Chips Ice chips: Not tested Thin Liquid Thin Liquid: Impaired (with medication administration) Presentation: Cup;Straw Oral Phase Impairments: Reduced labial seal Oral Phase Functional Implications: Right anterior spillage Other Comments: occasionally aphonic after swallow Nectar Thick Nectar Thick Liquid: Not tested Honey Thick Honey Thick Liquid: Not tested Puree Puree: Within functional limits Presentation: Spoon;Self Fed Solid Solid: Within functional limits Presentation: Self Fed BSE Assessment Risk for Aspiration Impact on safety and function: Mild aspiration risk Other Related Risk Factors: Deconditioning  Short Term Goals: Week 1: SLP Short Term Goal 1 (Week 1): Patient will consume least restrictive diet with use of compensatory strategies given supervision multimodal A SLP Short Term Goal 2 (Week 1): Patient will increase speech intelligiblity to 70% at the phrase level given mod multimodal A SLP Short Term Goal 3 (Week 1): Patient will demonstrate problem solving skills in mildly complex situations given mod multimodal A SLP Short Term Goal 4 (Week 1): Patient will recall and utilize memory compensatory strategies given mod multimodal A  Refer to Care Plan for Long Term Goals  Recommendations for other services: None   Discharge Criteria: Patient will be discharged from SLP if patient refuses treatment 3 consecutive times without medical reason, if treatment goals not met, if there is a change in medical status, if patient makes no progress towards goals or if patient is discharged from hospital.  The above assessment, treatment plan, treatment  alternatives and goals were discussed and mutually agreed upon: by patient  Jhony Antrim M.A., CCC-SLP 11/10/2023, 12:18 PM

## 2023-11-10 NOTE — Evaluation (Signed)
 Physical Therapy Assessment and Plan  Patient Details  Name: [name] MRN: [id number] Date of Birth: Apr 12, 1940  PT Diagnosis: Abnormal posture, Abnormality of gait, Cognitive deficits, Difficulty walking, Edema, Hemiparesis dominant, Impaired cognition, Impaired sensation, and Muscle weakness Rehab Potential: Fair ELOS: 2-3 weeks   Today's Date: 11/10/2023 PT Individual Time: 1032-1130 PT Individual Time Calculation (min): 58 min    Hospital Problem: Principal Problem:   Acute right hemiparesis (HCC) Active Problems:   Cerebellar stroke, acute (HCC)   Past Medical History:  Past Medical History:  Diagnosis Date   Acute bronchitis 12/17/2021   Acute cystitis 02/20/2019   Arthritis    Atrial flutter (HCC)    Breast cancer (HCC)    left  2009 and 2025   Diabetes mellitus without complication (HCC)    Dyspnea    mostly with exertion   GERD (gastroesophageal reflux disease)    History of radiation therapy    Left Lung-08/09/23-10/01/23- Dr. 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[facility])    Lupus    Pneumonia 07/31/2021   Past Surgical History:  Past Surgical History:  Procedure Laterality Date   BREAST BIOPSY Left 01/15/2023   US  LT BREAST BX W LOC DEV 1ST LESION IMG BX SPEC US  GUIDE 01/15/2023 [facility]   BREAST LUMPECTOMY Left 2009   BRONCHIAL BIOPSY  06/29/2023   Procedure: BRONCHOSCOPY, WITH BIOPSY;  Surgeon: [name], MD;  Location: [facility] ENDOSCOPY;  Service: Pulmonary;;   BRONCHIAL BRUSHINGS  06/29/2023   Procedure: BRONCHOSCOPY, WITH BRUSH BIOPSY;  Surgeon: [name], MD;  Location: [facility] ENDOSCOPY;  Service: Pulmonary;;   BRONCHIAL NEEDLE ASPIRATION BIOPSY  06/29/2023   Procedure: BRONCHOSCOPY, WITH NEEDLE ASPIRATION BIOPSY;  Surgeon: [name], MD;  Location: [facility] ENDOSCOPY;  Service: Pulmonary;;   IR 3D INDEPENDENT WKST  05/29/2021   IR ANGIO INTRA EXTRACRAN SEL COM CAROTID INNOMINATE BILAT MOD SED   05/29/2021   IR ANGIO VERTEBRAL SEL VERTEBRAL BILAT MOD SED  05/29/2021   LEFT HEART CATH AND CORONARY ANGIOGRAPHY N/A 11/01/2023   Procedure: LEFT HEART CATH AND CORONARY ANGIOGRAPHY;  Surgeon: Wonda Sharper, MD;  Location: [facility] INVASIVE CV LAB;  Service: Cardiovascular;  Laterality: N/A;   SIMPLE MASTECTOMY WITH AXILLARY SENTINEL NODE BIOPSY Left 03/31/2023   Procedure: LEFT MASTECTOMY;  Surgeon: Vernetta Berg, MD;  Location: [facility] OR;  Service: General;  Laterality: Left;  LMA PEC BLOCK   VIDEO BRONCHOSCOPY WITH ENDOBRONCHIAL NAVIGATION Left 06/29/2023   Procedure: VIDEO BRONCHOSCOPY WITH ENDOBRONCHIAL NAVIGATION;  Surgeon: [name], MD;  Location: [facility] ENDOSCOPY;  Service: Pulmonary;  Laterality: Left;   VIDEO BRONCHOSCOPY WITH ENDOBRONCHIAL ULTRASOUND Bilateral 06/29/2023   Procedure: BRONCHOSCOPY, WITH EBUS;  Surgeon: [name], MD;  Location: [facility] ENDOSCOPY;  Service: Pulmonary;  Laterality: Bilateral;    Assessment & Plan Clinical Impression: Patient is a 83 y.o. year old female PMHx of A.fib on Eliquis , recent dx of adenocarcinoma of lung undergoing radiation treatment, hx breast cancer, recent admission for NSTEMI with right heart cath (10/4-10/7) presented to [facility] on November 06, 2023, with symptoms of altered mental status, slurred speech, and right-sided weakness. Initial CT imaging of the head indicated a large hypodensity on the right side, suggestive of a large territory infarct. An MRI of the brain revealed multiple acute infarcts in a bilateral watershed distribution, affecting the convexities, inferior right frontal lobe, right caudate head, posterior parietal occipital lobes, right lateral pons, and posterior  cerebellum. There were also bilateral embolic watershed infarcts and bilateral cerebellum infarcts identified. Neurology assessed the stroke to be potentially related to atrial fibrillation with missed doses of Eliquis  or the recent NSTEMI with cardiac  catheterization. An MRA of the head and neck was negative for large vessel occlusion and showed no significant stenosis. Prior to her hospital admission, Jamaira was limited in the community, using a rollator walker more frequently over the past week and requiring assistance with mobility and functional cognition. Currently, she needs minimal to maximal assistance with activities of daily living (ADLs) and general mobility, and she transfers using moderate assistance. The patient resides with her family in a one-level home with a single step to enter. Therapy evaluations completed due to patient decreased functional mobility was admitted for a comprehensive rehab program.    Patient currently requires total with mobility secondary to muscle weakness and muscle joint tightness, decreased cardiorespiratoy endurance, unbalanced muscle activation, decreased coordination, and decreased motor planning, decreased midline orientation, decreased attention to right, and decreased motor planning, decreased attention, decreased problem solving, and decreased memory, and decreased sitting balance, decreased standing balance, decreased postural control, hemiplegia, and decreased balance strategies.  Prior to hospitalization, patient was min with mobility and lived with Family in a House home.  Home access is 1Stairs to enter.  Patient will benefit from skilled PT intervention to maximize safe functional mobility, minimize fall risk, and decrease caregiver burden for planned discharge home with 24 hour assist.  Anticipate patient will benefit from follow up [facility] at discharge.  PT - End of Session Activity Tolerance: Tolerates 30+ min activity with multiple rests Endurance Deficit: Yes Endurance Deficit Description: pt level of assist required and ability to speak fluctuated with increased fatigue. PT Assessment Rehab Potential (ACUTE/IP ONLY): Fair PT Barriers to Discharge: Incontinence;Home environment  access/layout;Pending chemo/radiation PT Barriers to Discharge Comments: pain PT Patient demonstrates impairments in the following area(s): Balance;Edema;Endurance;Motor;Pain;Perception;Safety;Sensory;Skin Integrity PT Transfers Functional Problem(s): Bed Mobility;Bed to Chair;Car PT Locomotion Functional Problem(s): Ambulation;Wheelchair Mobility;Stairs PT Plan PT Intensity: Minimum of 1-2 x/day ,45 to 90 minutes PT Frequency: 5 out of 7 days PT Duration Estimated Length of Stay: 2-3 weeks PT Treatment/Interventions: Ambulation/gait training;Community reintegration;DME/adaptive equipment instruction;Neuromuscular re-education;Psychosocial support;Stair training;UE/LE Strength taining/ROM;Wheelchair propulsion/positioning;Balance/vestibular training;Discharge planning;Functional electrical stimulation;Pain management;Skin care/wound management;Therapeutic Activities;Cognitive remediation/compensation;Disease management/prevention;Functional mobility training;Patient/family education;Splinting/orthotics;Therapeutic Exercise;Visual/perceptual remediation/compensation;UE/LE Coordination activities PT Transfers Anticipated Outcome(s): min A PT Locomotion Anticipated Outcome(s): mod A PT Recommendation Recommendations for Other Services: None Follow Up Recommendations: Home health PT Patient destination: Home Equipment Recommended: To be determined Equipment Details: need to verify DME with family   PT Evaluation Precautions/Restrictions Precautions Precautions: Fall Recall of Precautions/Restrictions: Intact Precaution/Restrictions Comments: SBP <180, R hemiparesis Required Braces or Orthoses: Other Brace Other Brace: R LE PRAFO, R UE sling, R UE resting hand splint Restrictions Weight Bearing Restrictions Per Provider Order: No Pain Interference Pain Interference Pain Effect on Sleep: 2. Occasionally Pain Interference with Therapy Activities: 8. Unable to answer (pt ability to speak  diminished with fatigue) Pain Interference with Day-to-Day Activities: 8. Unable to answer (pt ability to speak diminished with fatigue) Home Living/Prior Functioning Home Living Available Help at Discharge: Family;Available 24 hours/day Type of Home: House Home Access: Stairs to enter [employer] of Steps: 1 Entrance Stairs-Rails: Right;Left;Can reach both Home Layout: One level Bathroom Shower/Tub: Engineer, manufacturing systems: Standard Bathroom Accessibility: Yes Additional Comments: home environment and PLOF taken from chart review, pt verfifed with head nod, need to further verify with family  Lives With: Family  Prior Function Level of Independence: Needs assistance with homemaking;Needs assistance with tranfers;Needs assistance with ADLs;Needs assistance with gait;Requires assistive device for independence (need to verify with family)  Able to Take Stairs?: Yes Vision/Perception  Vision - History Ability to See in Adequate Light: 0 Adequate Perception Perception: Impaired Perception-Other Comments: R inattention Praxis Praxis: Impaired Praxis Impairment Details: Motor planning  Cognition Overall Cognitive Status: Impaired/Different from baseline Arousal/Alertness: Awake/alert Orientation Level: Oriented X4 Year: 2025 Month: October Day of Week: Correct Attention: Sustained Sustained Attention: Appears intact Memory: Impaired Memory Impairment: Decreased short term memory Decreased Short Term Memory: Verbal basic;Functional basic Awareness: Appears intact Problem Solving: Impaired Problem Solving Impairment: Verbal complex;Functional complex Safety/Judgment: Appears intact Sensation Sensation Light Touch: Impaired by gross assessment Additional Comments: R hemiparesis Coordination Gross Motor Movements are Fluid and Coordinated: No Fine Motor Movements are Fluid and Coordinated: No Coordination and Movement Description: limited 2/2 to R hemiparesis  with 0-1/5 strength Finger Nose Finger Test: limited by weakness Heel Shin Test: limited by weakness Motor  Motor Motor: Hemiplegia  Trunk/Postural Assessment  Cervical Assessment Cervical Assessment: Exceptions to [facility] (forward head, heavy cervial flexion with standing, able to correct but difficulty sustaining) Thoracic Assessment Thoracic Assessment: Exceptions to [facility] (kyphosis, heavy lateral trunk lean to R increased with fatigue) Lumbar Assessment Lumbar Assessment: Exceptions to [facility] (posterior pelvic tilt, heavy lateral trunk lean to R increased with fatigue) Postural Control Postural Control: Deficits on evaluation Head Control: difficuty holding head up with fatigue Trunk Control: heavy lateral trunk lean to R Righting Reactions: delayed, requires L UE support Protective Responses: delayed  Balance Balance Balance Assessed: Yes Static Sitting Balance Static Sitting - Balance Support: Left upper extremity supported Static Sitting - Level of Assistance: 4: Min assist Dynamic Sitting Balance Dynamic Sitting - Balance Support: Left upper extremity supported;During functional activity Dynamic Sitting - Level of Assistance: 3: Mod assist Sitting balance - Comments: seated EOB, needing mod A-bouts of CGA to sustain midline. Cued to use L UE to correct Extremity Assessment  RLE Assessment RLE Assessment: Exceptions to [facility] RLE Strength Right Hip Flexion: 1/5 Right Hip ABduction: 1/5 Right Knee Flexion: 2/5 Right Knee Extension: 0/5 Right Ankle Dorsiflexion: 1/5 Right Ankle Plantar Flexion: 2-/5 LLE Assessment LLE Assessment: Exceptions to [facility] General Strength Comments: grossly 4/5 with MMT, however, noted L LE buckling in standing  Care Tool Care Tool Bed Mobility Roll left and right activity   Roll left and right assist level: Total Assistance - Patient < 25%    Sit to lying activity   Sit to lying assist level: Maximal Assistance - Patient 25 - 49%    Lying to  sitting on side of bed activity   Lying to sitting on side of bed assist level: the ability to move from lying on the back to sitting on the side of the bed with no back support.: Maximal Assistance - Patient 25 - 49%     Care Tool Transfers Sit to stand transfer   Sit to stand assist level: 2 Helpers    Chair/bed transfer Chair/bed transfer activity did not occur: Safety/medical concerns (fatigue)      Car transfer Car transfer activity did not occur: Safety/medical concerns        Care Tool Locomotion Ambulation Ambulation activity did not occur: Safety/medical concerns        Walk 10 feet activity Walk 10 feet activity did not occur: Safety/medical concerns       Walk 50 feet with 2 turns activity Walk  50 feet with 2 turns activity did not occur: Safety/medical concerns      Walk 150 feet activity Walk 150 feet activity did not occur: Safety/medical concerns      Walk 10 feet on uneven surfaces activity Walk 10 feet on uneven surfaces activity did not occur: Safety/medical concerns      Stairs Stair activity did not occur: Safety/medical concerns        Walk up/down 1 step activity Walk up/down 1 step or curb (drop down) activity did not occur: Safety/medical concerns      Walk up/down 4 steps activity Walk up/down 4 steps activity did not occur: Safety/medical concerns      Walk up/down 12 steps activity Walk up/down 12 steps activity did not occur: Safety/medical concerns      Pick up small objects from floor Pick up small object from the floor (from standing position) activity did not occur: Safety/medical concerns      Wheelchair Is the patient using a wheelchair?: Yes Type of Wheelchair: Manual (TIS)   Wheelchair assist level: Dependent - Patient 0%    Wheel 50 feet with 2 turns activity   Assist Level: Dependent - Patient 0%  Wheel 150 feet activity   Assist Level: Dependent - Patient 0%    Refer to Care Plan for Long Term Goals  SHORT TERM GOAL  WEEK 1 PT Short Term Goal 1 (Week 1): pt will toelrate sitting in TIS WC between sessions PT Short Term Goal 2 (Week 1): Pt will perform bed to chair transfer with LRAD and max A PT Short Term Goal 3 (Week 1): pt will perform sit to stand with LRAD and max A +1  Recommendations for other services: None   Skilled Therapeutic Intervention Mobility Bed Mobility Bed Mobility: Rolling Right;Rolling Left;Supine to Sit;Sit to Supine Rolling Right: Supervision/verbal cueing (with and without bed rail) Rolling Left: Total Assistance - Patient < 25% Left Sidelying to Sit: Maximal Assistance - Patient 25-49%;Total Assistance - Patient < 25% (with bed rail) Supine to Sit: Maximal Assistance - Patient - Patient 25-49% Sit to Supine: Maximal Assistance - Patient 25-49% Transfers Transfers: Sit to Stand;Stand to Sit Sit to Stand: 2 Helpers (elevated bed, B HHA, buckling B LE) Stand to Sit: 2 Advertising account planner (Assistive device): 2 person hand held assist Locomotion  Gait Ambulation: No Gait Gait: No Stairs / Additional Locomotion Stairs: No Corporate treasurer: Yes Wheelchair Assistance: Dependent - Patient 0% (TIS) Wheelchair Parts Management: Needs assistance   Discharge Criteria: Patient will be discharged from PT if patient refuses treatment 3 consecutive times without medical reason, if treatment goals not met, if there is a change in medical status, if patient makes no progress towards goals or if patient is discharged from hospital.  The above assessment, treatment plan, treatment alternatives and goals were discussed and mutually agreed upon: by patient  Today's interventions  Evaluation completed (see details above and below) with education on PT POC and goals and individual treatment initiated with focus on seated balance seated EOB.   Pt A&Ox4, pt agreeable to therapy. Pt endorses R shoulder pain. Notiied MD of need for R UE sling and resting hand splint and R  LE PRAFO. Education provided to pt regarding purpose of each.   Provided pt with 16x16 TIS WC with roho cushion.   Pt incontinent of bladder, pt performed rolling to R with supervision; to L with total A--sit to supine and supine to sit with max A.   Sit  to stand: +2 total A-heavy trunk lean and cervical flexion; B LE buckling. Recommending use of maxi move with nursing--updated safety plan to reflect this.   Pt overall fatigues very quickly--pt initiatlly able to answer orientation questions however ability to speak decreased with fatiuge. Pt able to ask yes/no questions.   Pt supine in bed at end of session with all needs within reach and bed alarm on.       [facility] [place], Parks, DPT  11/10/2023, 12:12 PM

## 2023-11-10 NOTE — Plan of Care (Signed)
  Problem: RH Balance Goal: LTG: Patient will maintain dynamic sitting balance (OT) Description: LTG:  Patient will maintain dynamic sitting balance with assistance during activities of daily living (OT) Flowsheets (Taken 11/10/2023 1450) LTG: Pt will maintain dynamic sitting balance during ADLs with: Supervision/Verbal cueing   Problem: Sit to Stand Goal: LTG:  Patient will perform sit to stand in prep for activites of daily living with assistance level (OT) Description: LTG:  Patient will perform sit to stand in prep for activites of daily living with assistance level (OT) Flowsheets (Taken 11/10/2023 1450) LTG: PT will perform sit to stand in prep for activites of daily living with assistance level: Minimal Assistance - Patient > 75%   Problem: RH Eating Goal: LTG Patient will perform eating w/assist, cues/equip (OT) Description: LTG: Patient will perform eating with assist, with/without cues using equipment (OT) Flowsheets (Taken 11/10/2023 1450) LTG: Pt will perform eating with assistance level of: Set up assist    Problem: RH Grooming Goal: LTG Patient will perform grooming w/assist,cues/equip (OT) Description: LTG: Patient will perform grooming with assist, with/without cues using equipment (OT) Flowsheets (Taken 11/10/2023 1450) LTG: Pt will perform grooming with assistance level of: Set up assist    Problem: RH Bathing Goal: LTG Patient will bathe all body parts with assist levels (OT) Description: LTG: Patient will bathe all body parts with assist levels (OT) Flowsheets (Taken 11/10/2023 1450) LTG: Pt will perform bathing with assistance level/cueing: Moderate Assistance - Patient 50 - 74%   Problem: RH Dressing Goal: LTG Patient will perform upper body dressing (OT) Description: LTG Patient will perform upper body dressing with assist, with/without cues (OT). Flowsheets (Taken 11/10/2023 1450) LTG: Pt will perform upper body dressing with assistance level of: Moderate  Assistance - Patient 50 - 74% Goal: LTG Patient will perform lower body dressing w/assist (OT) Description: LTG: Patient will perform lower body dressing with assist, with/without cues in positioning using equipment (OT) Flowsheets (Taken 11/10/2023 1450) LTG: Pt will perform lower body dressing with assistance level of: Moderate Assistance - Patient 50 - 74%   Problem: RH Toileting Goal: LTG Patient will perform toileting task (3/3 steps) with assistance level (OT) Description: LTG: Patient will perform toileting task (3/3 steps) with assistance level (OT)  Flowsheets (Taken 11/10/2023 1450) LTG: Pt will perform toileting task (3/3 steps) with assistance level: Moderate Assistance - Patient 50 - 74%   Problem: RH Functional Use of Upper Extremity Goal: LTG Patient will use RT/LT upper extremity as a (OT) Description: LTG: Patient will use right/left upper extremity as a stabilizer/gross assist/diminished/nondominant/dominant level with assist, with/without cues during functional activity (OT) Flowsheets (Taken 11/10/2023 1450) LTG: Use of upper extremity in functional activities: RUE as a stabilizer LTG: Pt will use upper extremity in functional activity with assistance level of: Minimal Assistance - Patient > 75%   Problem: RH Toilet Transfers Goal: LTG Patient will perform toilet transfers w/assist (OT) Description: LTG: Patient will perform toilet transfers with assist, with/without cues using equipment (OT) Flowsheets (Taken 11/10/2023 1450) LTG: Pt will perform toilet transfers with assistance level of: Moderate Assistance - Patient 50 - 74%   Problem: RH Tub/Shower Transfers Goal: LTG Patient will perform tub/shower transfers w/assist (OT) Description: LTG: Patient will perform tub/shower transfers with assist, with/without cues using equipment (OT) Flowsheets (Taken 11/10/2023 1450) LTG: Pt will perform tub/shower stall transfers with assistance level of: Moderate Assistance -  Patient 50 - 74%

## 2023-11-10 NOTE — Evaluation (Signed)
 Occupational Therapy Assessment and Plan  Patient Details  Name: [name] MRN: [id number] Date of Birth: 12/15/1940  OT Diagnosis: abnormal posture, apraxia, cognitive deficits, hemiplegia affecting dominant side, and muscle weakness (generalized) Rehab Potential: Rehab Potential (ACUTE ONLY): Good ELOS: 2-3 weeks   Today's Date: 11/10/2023 OT Individual Time: 8697-8584 OT Individual Time Calculation (min): 73 min     Hospital Problem: Principal Problem:   Acute right hemiparesis (HCC) Active Problems:   Cerebellar stroke, acute (HCC)   Past Medical History:  Past Medical History:  Diagnosis Date   Acute bronchitis 12/17/2021   Acute cystitis 02/20/2019   Arthritis    Atrial flutter (HCC)    Breast cancer (HCC)    left  2009 and 2025   Diabetes mellitus without complication (HCC)    Dyspnea    mostly with exertion   GERD (gastroesophageal reflux disease)    History of radiation therapy    Left Lung-08/09/23-10/01/23- Dr. 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[facility])    Lupus    Pneumonia 07/31/2021   Past Surgical History:  Past Surgical History:  Procedure Laterality Date   BREAST BIOPSY Left 01/15/2023   US  LT BREAST BX W LOC DEV 1ST LESION IMG BX SPEC US  GUIDE 01/15/2023 [facility] MAMMOGRAPHY   BREAST LUMPECTOMY Left 2009   BRONCHIAL BIOPSY  06/29/2023   Procedure: BRONCHOSCOPY, WITH BIOPSY;  Surgeon: [name], MD;  [facility];  Service: Pulmonary;;   BRONCHIAL BRUSHINGS  06/29/2023   Procedure: BRONCHOSCOPY, WITH BRUSH BIOPSY;  Surgeon: [name], MD;  Location: [facility];  Service: Pulmonary;;   BRONCHIAL NEEDLE ASPIRATION BIOPSY  06/29/2023   Procedure: BRONCHOSCOPY, WITH NEEDLE ASPIRATION BIOPSY;  Surgeon: [name], MD;  Location: [facility];  Service: Pulmonary;;   IR 3D INDEPENDENT WKST  05/29/2021   IR ANGIO INTRA EXTRACRAN SEL COM CAROTID INNOMINATE BILAT MOD SED  05/29/2021   IR ANGIO  VERTEBRAL SEL VERTEBRAL BILAT MOD SED  05/29/2021   LEFT HEART CATH AND CORONARY ANGIOGRAPHY N/A 11/01/2023   Procedure: LEFT HEART CATH AND CORONARY ANGIOGRAPHY;  Surgeon: Wonda Sharper, MD;  Location[facility] INVASIVE CV LAB;  Service: Cardiovascular;  Laterality: N/A;   SIMPLE MASTECTOMY WITH AXILLARY SENTINEL NODE BIOPSY Left 03/31/2023   Procedure: LEFT MASTECTOMY;  Surgeon: Vernetta Berg, MD;  Location: [facility] OR;  Service: General;  Laterality: Left;  LMA PEC BLOCK   VIDEO BRONCHOSCOPY WITH ENDOBRONCHIAL NAVIGATION Left 06/29/2023   Procedure: VIDEO BRONCHOSCOPY WITH ENDOBRONCHIAL NAVIGATION;  Surgeon: [name], MD;  Location: [facility] ENDOSCOPY;  Service: Pulmonary;  Laterality: Left;   VIDEO BRONCHOSCOPY WITH ENDOBRONCHIAL ULTRASOUND Bilateral 06/29/2023   Procedure: BRONCHOSCOPY, WITH EBUS;  Surgeon: [name], MD;  Location: [facility] ENDOSCOPY;  Service: Pulmonary;  Laterality: Bilateral;    Assessment & Plan Clinical Impression: Patient is a 83 y.o. year old female with PMHx of A.fib on Eliquis , recent dx of adenocarcinoma of lung undergoing radiation treatment, hx breast cancer, recent admission for NSTEMI with right heart cath (10/4-10/7) presented to [facility] on November 06, 2023, with symptoms of altered mental status, slurred speech, and right-sided weakness. Initial CT imaging of the head indicated a large hypodensity on the right side, suggestive of a large territory infarct. An MRI of the brain revealed multiple acute infarcts in a bilateral watershed distribution, affecting the convexities, inferior right frontal lobe, right caudate head, posterior parietal occipital lobes, right lateral pons, and posterior  cerebellum. There were also bilateral embolic watershed infarcts and bilateral cerebellum infarcts identified. Neurology assessed the stroke to be potentially related to atrial fibrillation with missed doses of Eliquis  or the recent NSTEMI with cardiac catheterization. An MRA of  the head and neck was negative for large vessel occlusion and showed no significant stenosis. Prior to her hospital admission, Shakura was limited in the community, using a rollator walker more frequently over the past week and requiring assistance with mobility and functional cognition. Currently, she needs minimal to maximal assistance with activities of daily living (ADLs) and general mobility, and she transfers using moderate assistance. The patient resides with her family in a one-level home with a single step to enter. Therapy evaluations completed due to patient decreased functional mobility was admitted for a comprehensive rehab program.   Patient currently requires total with basic self-care skills secondary to muscle weakness, decreased cardiorespiratoy endurance, impaired timing and sequencing, abnormal tone, unbalanced muscle activation, ataxia, and decreased coordination, decreased awareness and decreased safety awareness, and decreased sitting balance, decreased standing balance, decreased postural control, hemiplegia, and decreased balance strategies.  Prior to hospitalization, patient could complete ADLs with min.  Patient will benefit from skilled intervention to decrease level of assist with basic self-care skills and increase independence with basic self-care skills prior to discharge home with care partner.  Anticipate patient will require 24 hour supervision and follow up home health.  OT - End of Session Activity Tolerance: Tolerates 30+ min activity with multiple rests Endurance Deficit: Yes Endurance Deficit Description: pt level of assist required and ability to speak fluctuated with increased fatigue. OT Assessment Rehab Potential (ACUTE ONLY): Good OT Barriers to Discharge: Other (comments) OT Barriers to Discharge Comments: fatigue/ inability to wb due to flaccidity OT Patient demonstrates impairments in the following area(s):  Balance;Perception;Safety;Cognition;Sensory;Endurance;Motor;Skin Integrity OT Basic ADL's Functional Problem(s): Toileting;Dressing;Bathing;Eating;Grooming OT Transfers Functional Problem(s): Toilet;Tub/Shower OT Additional Impairment(s): Fuctional Use of Upper Extremity OT Plan OT Intensity: Minimum of 1-2 x/day, 45 to 90 minutes OT Frequency: 5 out of 7 days OT Duration/Estimated Length of Stay: 2-3 weeks OT Treatment/Interventions: UE/LE Strength taining/ROM;Wheelchair propulsion/positioning;Splinting/orthotics;Psychosocial support;Neuromuscular re-education;DME/adaptive equipment instruction;Community reintegration;Visual/perceptual remediation/compensation;UE/LE Coordination activities;Therapeutic Activities;Self Care/advanced ADL retraining;Pain management;Discharge planning;Balance/vestibular training;Functional electrical stimulation;Therapeutic Exercise;Patient/family education;Functional mobility training;Disease mangement/prevention;Cognitive remediation/compensation OT Self Feeding Anticipated Outcome(s): SU OT Basic Self-Care Anticipated Outcome(s): min A to mod A OT Toileting Anticipated Outcome(s): mod A OT Bathroom Transfers Anticipated Outcome(s): min A OT Recommendation Recommendations for Other Services: None Patient destination: Home Follow Up Recommendations: 24 hour supervision/assistance;Home health OT Equipment Recommended: 3 in 1 bedside comode   OT Evaluation Precautions/Restrictions  Precautions Precautions: Fall Recall of Precautions/Restrictions: Intact Precaution/Restrictions Comments: SBP <180, R hemiparesis Required Braces or Orthoses: Other Brace Other Brace: R LE PRAFO, R UE sling, R UE resting hand splint Restrictions Weight Bearing Restrictions Per Provider Order: No General Chart Reviewed: Yes Response to Previous Treatment: Not applicable Family/Caregiver Present: No Vital Signs   Pain Pain Assessment Pain Scale: 0-10 Pain Score: 0-No  pain Home Living/Prior Functioning Home Living Family/patient expects to be discharged to:: Private residence Living Arrangements: Children Available Help at Discharge: Family, Available 24 hours/day Type of Home: House Home Access: Stairs to enter [employer] of Steps: 1 Entrance Stairs-Rails: Right, Left, Can reach both Home Layout: One level Bathroom Shower/Tub: Armed forces operational officer Accessibility: Yes Additional Comments: home environment and PLOF taken from chart review, pt verfifed with head nod, need to further verify with family  Lives With: Family, Daughter IADL  History Current License: No Mode of Transportation: Family Occupation: Retired Prior Function Level of Independence: Needs assistance with homemaking, Needs assistance with tranfers, Needs assistance with ADLs, Needs assistance with gait, Requires assistive device for independence  Able to Take Stairs?: Yes Vision Baseline Vision/History: 1 Wears glasses Ability to See in Adequate Light: 0 Adequate Patient Visual Report: No change from baseline Vision Assessment?: No apparent visual deficits;Wears glasses for reading Perception  Perception: Impaired Perception-Other Comments: R inattention Praxis Praxis: Impaired Cognition Cognition Overall Cognitive Status: Impaired/Different from baseline Arousal/Alertness: Awake/alert Memory: Impaired Memory Impairment: Decreased short term memory Decreased Short Term Memory: Verbal basic;Functional basic Attention: Sustained Sustained Attention: Appears intact Awareness: Appears intact Problem Solving: Impaired Problem Solving Impairment: Verbal complex;Functional complex Safety/Judgment: Appears intact Brief Interview for Mental Status (BIMS) Repetition of Three Words (First Attempt): 3 Temporal Orientation: Year: Correct Temporal Orientation: Month: Accurate within 5 days Temporal Orientation: Day: Correct Recall:  Sock: Yes, no cue required Recall: Blue: Yes, no cue required Recall: Bed: Yes, after cueing (a piece of furniture) BIMS Summary Score: 14 Sensation Sensation Light Touch: Impaired by gross assessment Additional Comments: R hemiparesis Coordination Gross Motor Movements are Fluid and Coordinated: No Fine Motor Movements are Fluid and Coordinated: No Coordination and Movement Description: limited 2/2 to R hemiparesis with 0-1/5 strength Finger Nose Finger Test: limited by weakness Heel Shin Test: limited by weakness Motor  Motor Motor: Hemiplegia  Trunk/Postural Assessment  Cervical Assessment Cervical Assessment: Exceptions to [facility] (forward head, hevay flexion, difficulty keeping head upright) Thoracic Assessment Thoracic Assessment: Exceptions to [facility] (rounded shoulders, increased lean with fatigue) Lumbar Assessment Lumbar Assessment: Exceptions to [facility] Postural Control Postural Control: Deficits on evaluation Head Control: difficuty holding head up with fatigue Trunk Control: heavy lateral trunk lean to R Righting Reactions: delayed, requires L UE support Protective Responses: delayed  Balance Balance Balance Assessed: Yes Static Sitting Balance Static Sitting - Balance Support: Left upper extremity supported Static Sitting - Level of Assistance: 4: Min assist Dynamic Sitting Balance Dynamic Sitting - Balance Support: Left upper extremity supported;During functional activity Dynamic Sitting - Level of Assistance: 3: Mod assist;2: Max assist Dynamic Sitting - Balance Activities: Head control activities Sitting balance - Comments: able to sit with rail support at midline, needed cueing and mod A without rail to maintain, used LUE to correct Extremity/Trunk Assessment RUE Assessment RUE Assessment: Exceptions to [facility] Active Range of Motion (AROM) Comments: no active range General Strength Comments: 0/5 RUE Body System: Neuro LUE Assessment LUE Assessment: Within  Functional Limits Active Range of Motion (AROM) Comments: WFL General Strength Comments: 4/5  Care Tool Care Tool Self Care Eating   Eating Assist Level: Supervision/Verbal cueing (SU needed)    Oral Care    Oral Care Assist Level: Minimal Assistance - Patient > 75%    Bathing   Body parts bathed by patient: Right arm;Left arm;Chest;Abdomen;Front perineal area Body parts bathed by helper: Abdomen;Chest;Front perineal area;Buttocks;Right upper leg;Left upper leg;Right lower leg;Left lower leg;Face   Assist Level: Total Assistance - Patient < 25%    Upper Body Dressing(including orthotics)   What is the patient wearing?: Hospital gown only;Pull over shirt   Assist Level: Total Assistance - Patient < 25%    Lower Body Dressing (excluding footwear)   What is the patient wearing?: Underwear/pull up Assist for lower body dressing: Total Assistance - Patient < 25%    Putting on/Taking off footwear   What is the patient wearing?: Socks Assist for footwear: Dependent - Patient 0%  Care Tool Toileting Toileting activity   Assist for toileting: Total Assistance - Patient < 25%     Care Tool Bed Mobility Roll left and right activity   Roll left and right assist level: Total Assistance - Patient < 25%    Sit to lying activity   Sit to lying assist level: Maximal Assistance - Patient 25 - 49%    Lying to sitting on side of bed activity   Lying to sitting on side of bed assist level: the ability to move from lying on the back to sitting on the side of the bed with no back support.: Maximal Assistance - Patient 25 - 49%     Care Tool Transfers Sit to stand transfer   Sit to stand assist level: 2 Helpers    Chair/bed transfer Chair/bed transfer activity did not occur: Safety/medical concerns (fatigue) Chair/bed transfer assist level: Total Assistance - Patient < 25%     Toilet transfer   Assist Level: Total Assistance - Patient < 25%     Care Tool  Cognition  Expression of Ideas and Wants Expression of Ideas and Wants: 2. Frequent difficulty - frequently exhibits difficulty with expressing needs and ideas  Understanding Verbal and Non-Verbal Content Understanding Verbal and Non-Verbal Content: 3. Usually understands - understands most conversations, but misses some part/intent of message. Requires cues at times to understand   Memory/Recall Ability Memory/Recall Ability : Current season;That he or she is in a hospital/hospital unit   Refer to Care Plan for Long Term Goals  SHORT TERM GOAL WEEK 1 OT Short Term Goal 1 (Week 1): patient will complete UB dressing with max A OT Short Term Goal 2 (Week 1): patient will complete grooming with mod A OT Short Term Goal 3 (Week 1): patient will complete functional toilet transfer to bsc max A OT Short Term Goal 4 (Week 1): patient will complete toileting on [employer] or toilet with total A  Recommendations for other services: Neuropsych   Skilled Therapeutic Intervention   Evaluation completed (see details above) with education on OT POC and goals and individual treatment initiated with focus on functional mobility/transfers, ADL re-training,  generalized strengthening and endurance, dynamic standing balance/coordination, pt/family education and discharge planning. Pt education provided on therapy schedule and safety policy with use of chair alarm. Pt participated in goal setting. Pt participated in modified ADL task (See above for functional performance). Patient requires increased assistance for all ADLs due to hemiplegia. Patient demonstrated buckling in RLE and flaccidity in RUE leading to decreased bilateral coordination to complete grooming tasks. Patient is very motivated to complete activity however limited by decreased activity tolerance.    ADL ADL Equipment Provided: Reacher Eating: Supervision/safety (food needs to be SU) Grooming: Moderate assistance Where Assessed-Grooming: Sitting at  sink;Wheelchair Upper Body Bathing: Moderate assistance Where Assessed-Upper Body Bathing: Sitting at sink Upper Body Dressing: Maximal assistance Where Assessed-Upper Body Dressing: Sitting at sink Lower Body Dressing: Dependent Where Assessed-Lower Body Dressing: Bed level Toileting: Dependent Where Assessed-Toileting: Bed level Toilet Transfer: Dependent;Maximal assistance Toilet Transfer Method: Squat pivot Toilet Transfer Equipment: Therapist, sports Transfer: Maximal assistance;Dependent Mobility  Bed Mobility Bed Mobility: Rolling Right;Rolling Left;Supine to Sit;Sit to Supine Rolling Right: Supervision/verbal cueing Rolling Left: Total Assistance - Patient < 25% Left Sidelying to Sit: Maximal Assistance - Patient 25-49%;Total Assistance - Patient < 25% Supine to Sit: Maximal Assistance - Patient - Patient 25-49% Sit to Supine: Maximal Assistance - Patient 25-49% Transfers Sit to Stand: Total Assistance - Patient <  25% Stand to Sit: 2 Helpers;Total Assistance - Patient < 25%   Discharge Criteria: Patient will be discharged from OT if patient refuses treatment 3 consecutive times without medical reason, if treatment goals not met, if there is a change in medical status, if patient makes no progress towards goals or if patient is discharged from hospital.  The above assessment, treatment plan, treatment alternatives and goals were discussed and mutually agreed upon: by patient  D'mariea L Meisha Salone 11/10/2023, 2:11 PM

## 2023-11-10 NOTE — Progress Notes (Signed)
 Inpatient Rehabilitation Care Coordinator Assessment and Plan Patient Details  Name: SHARONLEE NINE MRN: [id number] Date of Birth: [birth year]  Today's Date: 11/10/2023  Hospital Problems: Principal Problem:   Acute right hemiparesis [facility]) Active Problems:   Cerebellar stroke, acute [facility])  Past Medical History:  Past Medical History:  Diagnosis Date   Acute bronchitis 12/17/2021   Acute cystitis 02/20/2019   Arthritis    Atrial flutter (HCC)    Breast cancer (HCC)    left  2009 and 2025   Diabetes mellitus without complication (HCC)    Dyspnea    mostly with exertion   GERD (gastroesophageal reflux disease)    History of radiation therapy    Left Lung-08/09/23-10/01/23- Dr. 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[facility])    Lupus    Pneumonia 07/31/2021   Past Surgical History:  Past Surgical History:  Procedure Laterality Date   BREAST BIOPSY Left 01/15/2023   US  LT BREAST BX W LOC DEV 1ST LESION IMG BX SPEC US  GUIDE 01/15/2023 [facility] MAMMOGRAPHY   BREAST LUMPECTOMY Left 2009   BRONCHIAL BIOPSY  06/29/2023   Procedure: BRONCHOSCOPY, WITH BIOPSY;  Surgeon: [name], MD;  [facility]: [facility];  Service: Pulmonary;;   BRONCHIAL BRUSHINGS  06/29/2023   Procedure: BRONCHOSCOPY, WITH BRUSH BIOPSY;  Surgeon: [name], MD;  Location: [facility]OS[facility];  Service: Pulmonary;;   BRONCHIAL NEEDLE ASPIRATION BIOPSY  06/29/2023   Procedure: BRONCHOSCOPY, WITH NEEDLE ASPIRATION BIOPSY;  Surgeon: [name], MD;  [facility]: [facility];  Service: Pulmonary;;   IR 3D INDEPENDENT WKST  05/29/2021   IR ANGIO INTRA EXTRACRAN SEL COM CAROTID INNOMINATE BILAT MOD SED  05/29/2021   IR ANGIO VERTEBRAL SEL VERTEBRAL BILAT MOD SED  05/29/2021   LEFT HEART CATH AND CORONARY ANGIOGRAPHY N/A 11/01/2023   Procedure: LEFT HEART CATH AND CORONARY ANGIOGRAPHY;  Surgeon: Wonda Sharper, MD;  Location: [facility] INVASIVE CV LAB;  Service: Cardiovascular;  Laterality:  N/A;   SIMPLE MASTECTOMY WITH AXILLARY SENTINEL NODE BIOPSY Left 03/31/2023   Procedure: LEFT MASTECTOMY;  Surgeon: Vernetta Berg, MD;  Location: [facility] OR;  Service: General;  Laterality: Left;  LMA PEC BLOCK   VIDEO BRONCHOSCOPY WITH ENDOBRONCHIAL NAVIGATION Left 06/29/2023   Procedure: VIDEO BRONCHOSCOPY WITH ENDOBRONCHIAL NAVIGATION;  Surgeon: [name], MD;  Location: [facility] ENDOSCOPY;  Service: Pulmonary;  Laterality: Left;   VIDEO BRONCHOSCOPY WITH ENDOBRONCHIAL ULTRASOUND Bilateral 06/29/2023   Procedure: BRONCHOSCOPY, WITH EBUS;  Surgeon: [name], MD;  Location: [facility] ENDOSCOPY;  Service: Pulmonary;  Laterality: Bilateral;   Social History:  reports that she has never smoked. She has never been exposed to tobacco smoke. She does not have any smokeless tobacco history on file. She reports that she does not drink alcohol  and does not use drugs.  Family / Support Systems Marital Status: Single Patient Roles: Parent, Other (Comment) Children: [name] Griffiths Other Supports: Grand daughter Anticipated Caregiver: [name] Caregiver Availability: 24/7  Social History Preferred language: English Religion: [employer] Education: High school Health Literacy - How often do you need to have someone help you when you read instructions, pamphlets, or other written material from your doctor or pharmacy?: Never Writes: Yes Employment Status: Retired Age Retired: 64   Abuse/Neglect Abuse/Neglect Assessment Can Be Completed: Yes Physical Abuse: Denies Verbal Abuse: Denies Sexual Abuse: Denies Exploitation of patient/patient's resources: Denies Self-Neglect: Denies  Patient response to: Social Isolation - How often do you feel lonely or  isolated from those around you?: Never  Emotional Status Pt's affect, behavior and adjustment status: Patient adjusting to therapy Recent Psychosocial Issues: None Psychiatric History: None Substance Abuse History: None  Patient /  Family Perceptions, Expectations & Goals Pt/Family understanding of illness & functional limitations: Patient/family understanding of illness & functional limitations Premorbid pt/family roles/activities: Home Anticipated changes in roles/activities/participation: None anticipated Pt/family expectations/goals: Has realistic expectations  [employer] Agencies: None Premorbid Home Care/DME Agencies: Other (Comment) Transportation available at discharge: Yes, daughter [name] Is the patient able to respond to transportation needs?: Yes In the past 12 months, has lack of transportation kept you from medical appointments or from getting medications?: No In the past 12 months, has lack of transportation kept you from meetings, work, or from getting things needed for daily living?: No Resource referrals recommended: Neuropsychology  Discharge Planning Living Arrangements: Children Support Systems: Children Type of Residence: Private residence Insurance Resources: Media planner (specify) (UNITED HEALTHCARE MEDICARE / DREMA DUAL COMPLETE) Financial Screen Referred: No Living Expenses: Lives with family Money Management: Patient, Family Does the patient have any problems obtaining your medications?: No Home Management: Daughter manages home Patient/Family Preliminary Plans: Patient plans on returning home with daughter upon discharge Care Coordinator Anticipated Follow Up Needs: HH/OP Expected length of stay: 12-15 days  Clinical Impression CSW met with patient/family to introduce herself and complete initial assessment. Patient is AxO and able to make all needs known. Patient is an 83 y/o female admitted to [facility] due to CVA. She is able to speak it is very low and she cannot find all of her words at times but does not give up. 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 She has DME: RW she used in the community while shopping -  will verify what she has. Patient is not a Cytogeneticist. Therapy goals include regaining independence to return home.Transportation will be provided by [place] upon discharge.There were no further needs or concerns at present. CSW will follow up with family and continue to follow.   [name]  [name] 11/10/2023, 3:22 PM

## 2023-11-10 NOTE — Progress Notes (Signed)
 Inpatient Rehabilitation  Patient information reviewed and entered into eRehab system by [facility]. Karen Kays., CCC/SLP, PPS Coordinator.  Information including medical coding, functional ability and quality indicators will be reviewed and updated through discharge.

## 2023-11-10 NOTE — Plan of Care (Signed)
  Problem: RH Balance Goal: LTG Patient will maintain dynamic sitting balance (PT) Description: LTG:  Patient will maintain dynamic sitting balance with assistance during mobility activities (PT) Flowsheets (Taken 11/10/2023 1250) LTG: Pt will maintain dynamic sitting balance during mobility activities with:: Minimal Assistance - Patient > 75% Goal: LTG Patient will maintain dynamic standing balance (PT) Description: LTG:  Patient will maintain dynamic standing balance with assistance during mobility activities (PT) Flowsheets (Taken 11/10/2023 1250) LTG: Pt will maintain dynamic standing balance during mobility activities with:: Minimal Assistance - Patient > 75%   Problem: Sit to Stand Goal: LTG:  Patient will perform sit to stand with assistance level (PT) Description: LTG:  Patient will perform sit to stand with assistance level (PT) Flowsheets (Taken 11/10/2023 1250) LTG: PT will perform sit to stand in preparation for functional mobility with assistance level: Minimal Assistance - Patient > 75%   Problem: RH Bed to Chair Transfers Goal: LTG Patient will perform bed/chair transfers w/assist (PT) Description: LTG: Patient will perform bed to chair transfers with assistance (PT). Flowsheets (Taken 11/10/2023 1250) LTG: Pt will perform Bed to Chair Transfers with assistance level: Minimal Assistance - Patient > 75%   Problem: RH Car Transfers Goal: LTG Patient will perform car transfers with assist (PT) Description: LTG: Patient will perform car transfers with assistance (PT). Flowsheets (Taken 11/10/2023 1250) LTG: Pt will perform car transfers with assist:: Minimal Assistance - Patient > 75%   Problem: RH Ambulation Goal: LTG Patient will ambulate in controlled environment (PT) Description: LTG: Patient will ambulate in a controlled environment, # of feet with assistance (PT). Flowsheets (Taken 11/10/2023 1250) LTG: Pt will ambulate in controlled environ  assist needed:: Moderate  Assistance - Patient 50 - 74% LTG: Ambulation distance in controlled environment: 100 feet with LRAD   Problem: RH Wheelchair Mobility Goal: LTG Patient will propel w/c in controlled environment (PT) Description: LTG: Patient will propel wheelchair in controlled environment, # of feet with assist (PT) Flowsheets (Taken 11/10/2023 1250) LTG: Pt will propel w/c in controlled environ  assist needed:: Supervision/Verbal cueing LTG: Propel w/c distance in controlled environment: 150 feet Goal: LTG Patient will propel w/c in home environment (PT) Description: LTG: Patient will propel wheelchair in home environment, # of feet with assistance (PT). Flowsheets (Taken 11/10/2023 1250) LTG: Pt will propel w/c in home environ  assist needed:: Supervision/Verbal cueing LTG: Propel w/c distance in home environment: 50

## 2023-11-10 NOTE — Plan of Care (Signed)
  Problem: RH Swallowing Goal: LTG Patient will consume least restrictive diet using compensatory strategies with assistance (SLP) Description: LTG:  Patient will consume least restrictive diet using compensatory strategies with assistance (SLP) Flowsheets (Taken 11/10/2023 1216) LTG: Pt Patient will consume least restrictive diet using compensatory strategies with assistance of (SLP): Modified Independent   Problem: RH Expression Communication Goal: LTG Patient will increase speech intelligibility (SLP) Description: LTG: Patient will increase speech intelligibility at word/phrase/conversation level with cues, % of the time (SLP) Flowsheets (Taken 11/10/2023 1216) LTG: Patient will increase speech intelligibility (SLP): Minimal Assistance - Patient > 75% Level: Phrase Percent of time patient will use intelligible speech: 80   Problem: RH Problem Solving Goal: LTG Patient will demonstrate problem solving for (SLP) Description: LTG:  Patient will demonstrate problem solving for basic/complex daily situations with cues  (SLP) Flowsheets (Taken 11/10/2023 1216) LTG: Patient will demonstrate problem solving for (SLP): Basic daily situations LTG Patient will demonstrate problem solving for: Minimal Assistance - Patient > 75%   Problem: RH Memory Goal: LTG Patient will use memory compensatory aids to (SLP) Description: LTG:  Patient will use memory compensatory aids to recall biographical/new, daily complex information with cues (SLP) Flowsheets (Taken 11/10/2023 1216) LTG: Patient will use memory compensatory aids to (SLP): Minimal Assistance - Patient > 75%

## 2023-11-10 NOTE — Progress Notes (Signed)
 Orthopedic Tech Progress Note Patient Details:  [name] [birth year] [id number]  Ortho Devices Type of Ortho Device: Arm sling Ortho Device/Splint Location: RUE Ortho Device/Splint Interventions: Ordered Delivered HANGER'S  PRAFO BOOT, RESTING HAND SPLINT, as well as an ARM SLING for patient. She was relaxing in bed   Post Interventions Patient Tolerated: Well Instructions Provided: Care of device  Delanna LITTIE Pac 11/10/2023, 12:03 PM

## 2023-11-10 NOTE — Progress Notes (Signed)
 PROGRESS NOTE    Subjective/Complaints: Patient fatigued after working with therapy.  Minimal verbal output when I came in but therapy reports she was speaking more well initially until she got tired.  ROS: Right shoulder pain, limited by communication   Objective:   No results found. Recent Labs    11/09/23 0115 11/10/23 0504  WBC 4.9 5.0  HGB 9.7* 9.8*  HCT 29.1* 29.5*  PLT 160 147*   Recent Labs    11/10/23 0504  NA 131*  K 4.3  CL 99  CO2 23  GLUCOSE 107*  BUN 15  CREATININE 0.91  CALCIUM  9.1    Intake/Output Summary (Last 24 hours) at 11/10/2023 0850 Last data filed at 11/10/2023 0542 Gross per 24 hour  Intake 177 ml  Output 250 ml  Net -73 ml        Physical Exam: Vital Signs Blood pressure (!) 123/53, pulse 70, temperature 98.4 F (36.9 C), temperature source Oral, resp. rate 20, height 5' 6.5 (1.689 m), weight 56.4 kg, SpO2 100%.   General: No acute distress working with therapy HEENT: Head is normocephalic, atraumatic, PERRLA, EOMI, sclera anicteric, oral mucosa dry Neck: Supple without JVD or lymphadenopathy Heart: Reg rate and rhythm. No murmurs rubs or gallops Chest: CTA bilaterally without wheezes, rales, or rhonchi; no distress Abdomen: Soft, non-tender, non-distended, bowel sounds positive. Extremities: No clubbing, cyanosis, or edema.  Psych: Flat Skin: Clean and intact without signs of breakdown Neuro: Alert and awake, minimal verbal output/delayed responses.  No increased tone noted.  Decreased sensation light touch right lower extremity Decreased to light touch on RLE< but not RUE or face  RUE- 0/5  LUE 4+/5 throughout RLE- HF 1/5; KE/KF 0/5; DF 0/5; PF 2-/5 LLE- 4+/5 throughout Musculoskeletal: Mild right shoulder tenderness, mild pain with ROM    Assessment/Plan: 1. Functional deficits which require 3+ hours per day of interdisciplinary therapy in a comprehensive  inpatient rehab setting. Physiatrist is providing close team supervision and 24 hour management of active medical problems listed below. Physiatrist and rehab team continue to assess barriers to discharge/monitor patient progress toward functional and medical goals  Care Tool:  Bathing              Bathing assist       Upper Body Dressing/Undressing Upper body dressing        Upper body assist      Lower Body Dressing/Undressing Lower body dressing            Lower body assist       Toileting Toileting    Toileting assist       Transfers Chair/bed transfer  Transfers assist           Locomotion Ambulation   Ambulation assist              Walk 10 feet activity   Assist           Walk 50 feet activity   Assist           Walk 150 feet activity   Assist           Walk 10 feet on uneven surface  activity  Assist           Wheelchair     Assist               Wheelchair 50 feet with 2 turns activity    Assist            Wheelchair 150 feet activity     Assist          Blood pressure (!) 123/53, pulse 70, temperature 98.4 F (36.9 C), temperature source Oral, resp. rate 20, height 5' 6.5 (1.689 m), weight 56.4 kg, SpO2 100%.  Medical Problem List and Plan: 1. Functional deficits secondary to  bilateral embolic shower, embolic pattern, etiology:  Afib and missed Eliquis  doses vs from NSTEMI with cardiac cath.              -patient may  shower             -ELOS/Goals: 12-15 days min A-              Cannot sit upright without propping             Admit to CIR  -Team conference today please see physician documentation under team conference tab, met with team  to discuss problems,progress, and goals. Formulized individual treatment plan based on medical history, underlying problem and comorbidities.   - Order PRAFO/WHO right upper extremity  2.  Antithrombotics: -DVT/anticoagulation:   Pharmaceutical: Eliquis  Transitioned from IV Heparin  to Eliquis  10/14             -antiplatelet therapy: Asprin 81 mg  3. Pain Management: Gabapentin  and Tylenol  as needed  -Voltaren  R shoulder for R shoulder pain 4. Mood/Behavior/Sleep: LCSW to follow for evaluation and support when available.              -antipsychotic agents: N/A 5. Neuropsych/cognition: This patient is not quite capable of making decisions on her own behalf. 6. Skin/Wound Care: routine pressure relief measures.  7. Fluids/Electrolytes/Nutrition: monitor intake and output. Follow up labs in a.m.              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 Continue aspirin  and statin.   9. CAD: 10/4-10/7 admitted for NSTEMI  10. HLD: Lipitor 40 mg  11. HTN: monitor blood pressures on  Hyzaar, Cardizem , and Aldactone      11/10/2023    5:30 AM 11/09/2023    8:34 PM 11/09/2023    3:14 PM  Vitals with BMI  Height   5' 6.5  Weight   124 lbs 5 oz  BMI   19.77  Systolic 130 127   Diastolic 47 62   Pulse 65 72    10/15  controlled on cardizem , monitor  12. Paroxymal Atrial Flutter/Fib: continue Eliquis  2.5 bid 13. T2DM: HgbA1c 6.0, resumed home Amaryl   - monitor cbg AC/HS with SSI.  CBG (last 3)  Recent Labs    11/07/23 1533  GLUCAP 109*  - 10/15 Will order CBG/SSI  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  Hypothyroidism: Synthroid  17.  Chronic anemia: Stable, monitor CBC.  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 Constipation- will order Milk of Mg per pt request and Miralax  daily  -LBM 10/13- monitor    LOS: 1 days A FACE TO FACE EVALUATION WAS PERFORMED  Capelli Collier 11/10/2023, 8:50 AM

## 2023-11-11 DIAGNOSIS — E871 Hypo-osmolality and hyponatremia: Secondary | ICD-10-CM

## 2023-11-11 DIAGNOSIS — I639 Cerebral infarction, unspecified: Secondary | ICD-10-CM | POA: Diagnosis not present

## 2023-11-11 DIAGNOSIS — G8191 Hemiplegia, unspecified affecting right dominant side: Secondary | ICD-10-CM | POA: Diagnosis not present

## 2023-11-11 DIAGNOSIS — M25511 Pain in right shoulder: Secondary | ICD-10-CM | POA: Diagnosis not present

## 2023-11-11 DIAGNOSIS — I10 Essential (primary) hypertension: Secondary | ICD-10-CM | POA: Diagnosis not present

## 2023-11-11 LAB — BASIC METABOLIC PANEL WITH GFR
Anion gap: 9 (ref 5–15)
BUN: 17 mg/dL (ref 8–23)
CO2: 23 mmol/L (ref 22–32)
Calcium: 9.5 mg/dL (ref 8.9–10.3)
Chloride: 98 mmol/L (ref 98–111)
Creatinine, Ser: 0.86 mg/dL (ref 0.44–1.00)
GFR, Estimated: >60 mL/min (ref >60)
Glucose, Bld: 92 mg/dL (ref 70–99)
Potassium: 4.2 mmol/L (ref 3.5–5.1)
Sodium: 130 mmol/L — ABNORMAL LOW (ref 135–145)

## 2023-11-11 LAB — GLUCOSE, CAPILLARY
Glucose-Capillary: 106 mg/dL — ABNORMAL HIGH (ref 70–99)
Glucose-Capillary: 88 mg/dL (ref 70–99)
Glucose-Capillary: 98 mg/dL (ref 70–99)

## 2023-11-11 LAB — CBC
HCT: 29.9 % — ABNORMAL LOW (ref 36.0–46.0)
Hemoglobin: 10 g/dL — ABNORMAL LOW (ref 12.0–15.0)
MCH: 28.8 pg (ref 26.0–34.0)
MCHC: 33.4 g/dL (ref 30.0–36.0)
MCV: 86.2 fL (ref 80.0–100.0)
Platelets: 155 K/uL (ref 150–400)
RBC: 3.47 MIL/uL — ABNORMAL LOW (ref 3.87–5.11)
RDW: 16.8 % — ABNORMAL HIGH (ref 11.5–15.5)
WBC: 4.9 K/uL (ref 4.0–10.5)
nRBC: 0 % (ref 0.0–0.2)

## 2023-11-11 NOTE — Progress Notes (Signed)
 Occupational Therapy Session Note  Patient Details  Name: [name] MRN: [id number] Date of Birth: Sep 18, 1940  Today's Date: 11/11/2023 OT Individual Time: 1047-1200 OT Individual Time Calculation (min): 73 min    Short Term Goals: Week 1:  OT Short Term Goal 1 (Week 1): patient will complete UB dressing with max A OT Short Term Goal 2 (Week 1): patient will complete grooming with mod A OT Short Term Goal 3 (Week 1): patient will complete functional toilet transfer to bsc max A OT Short Term Goal 4 (Week 1): patient will complete toileting on BSC or toilet with total A  Skilled Therapeutic Interventions/Progress Updates:    Pt received resting in bed presenting to be in good spirits receptive to skilled OT session reporting 0/10 pain- OT offering intermittent rest breaks, repositioning, and therapeutic support to optimize participation in therapy session. Retrieved BSC and place over toilet. Focused this session on sitting balance, body positioning, and functional transfers. Supine>EOB with MOD A to pull up and maintain upright posture while seated EOB. Completed 2 squat pivot transfers EOB<>WC with MOD A for power and maintaining upright body posture. Engaged Pt in sitting EOB with MAX A to facilitate anterior pelvic tilt and upright posture to increase balance. Worked on place and hold exercises for bringing head into neck extension in midline in combination with scapular retraction with MAX A multimodal cues. Engaged Pt in 2 sit<>stand transfers with MOD A to power up and maintain standing balance. Provided MAX verbal and tactile cues to facilitate trunk and hip extension for upright posture. ROHO noted to be deflated, however spent time making appropriate adjustments to ensure Pt comfort and prevent skin break down. Squat pivot transfer EOB>WC with MOD A for power and maintaining upright posture. Used pillow to position RUE in safe position and under RLE to prevent possible pressure point.  Pt was left resting reclined in TIS WC with call bell in reach, seatbelt alarm on, daugher present and all needs met.    Therapy Documentation Precautions:  Precautions Precautions: Fall Recall of Precautions/Restrictions: Intact Precaution/Restrictions Comments: SBP <180, R hemiparesis Required Braces or Orthoses: Other Brace Other Brace: R LE PRAFO, R UE sling, R UE resting hand splint Restrictions Weight Bearing Restrictions Per Provider Order: No   Therapy/Group: Individual Therapy  Quinetta Shilling Van Schaick 11/11/2023, 12:52 PM

## 2023-11-11 NOTE — Progress Notes (Signed)
 Physical Therapy Session Note  Patient Details  Name: [name] MRN: [id number] Date of Birth: December 31, 1940  Today's Date: 11/11/2023 PT Individual Time: 0815-0928 PT Individual Time Calculation (min): 73 min   Short Term Goals: Week 1:  PT Short Term Goal 1 (Week 1): pt will toelrate sitting in TIS WC between sessions PT Short Term Goal 2 (Week 1): Pt will perform bed to chair transfer with LRAD and max A PT Short Term Goal 3 (Week 1): pt will perform sit to stand with LRAD and max A +1  Skilled Therapeutic Interventions/Progress Updates:      Treatment Session 1  Pt supine in bed upon arrival. Pt agreeable to therapy. Pt endorses neck pain, not quantified, requesting pain medicine. Notiified nurse.   Pt sling, R LE PRAFO, and R UE hand splint. PT adjsuted kickstand to appropriate position to facilitate neutral hip positioning.   Pt incontinent of bladder --pt aware of incontinence--pt aware of when she needed to pee. PT provided pt with urinal and encouraged pt to attempt to use it to reduce risk of skin breakdown. Pt unable to use bathroom at the moment but plan to assess pt ability to use urinal/bed pan  Fed pants through B LE with total A, pt performed bridge to donn over buttocks-rpt required assistance for R LE positioning in hook lying position-pt demonstrates mild buttock clearance-donned pants with total A over buttocks.   Pt overall demonstrates improved alertness and activation today:   R LE strength is more consistently 2/5 today for ankle/knee/hip.   Bed mobility: pt moving adducting R LE to sit on EOB--pt required heavy mod A for trunk control --verbal and tactile cues provided for sequencing/technique.   Seated balance EOB: pt required close supervision/CGA for static seated balance with L UE support; pt required mod A for dynamic seated balance with donning shirt seated EOB.   Sit to stand with +1 total A; verbal and tactile cues provided for trunk and hip  extension with quad activation. Pt performed x3-pt able to tolerate standing for ~30 seconds prior needing to sit 2/2 fatigue.   Squat pivot transfer with +1 mod A, verbal and tactile cues provided for technique--notified OT. Updated safety plan to reflect squat pivot transfer.   Pt performed lateral trunk lean to L elbow to upright  x5 and CGA, utilized mirror for visual cue for midline orientation.   Pt seated in TIS WC at end of session with all needs within reach and seatbelt alarm on.               Therapy Documentation Precautions:  Precautions Precautions: Fall Recall of Precautions/Restrictions: Intact Precaution/Restrictions Comments: SBP <180, R hemiparesis Required Braces or Orthoses: Other Brace Other Brace: R LE PRAFO, R UE sling, R UE resting hand splint Restrictions Weight Bearing Restrictions Per Provider Order: No   Therapy/Group: Individual Therapy  [facility] [name], [place], DPT  11/11/2023, 8:26 AM

## 2023-11-11 NOTE — Progress Notes (Signed)
 PROGRESS NOTE    Subjective/Complaints: No new complaints this am.  She reports IV is bothering her, it is in her dorsal hand.  ROS: Right shoulder pain-improved, denies abdominal pain, nausea, vomiting, chest pain or shortness of breath    Objective:   No results found. Recent Labs    11/10/23 0504 11/11/23 0558  WBC 5.0 4.9  HGB 9.8* 10.0*  HCT 29.5* 29.9*  PLT 147* 155   Recent Labs    11/10/23 0504 11/11/23 0558  NA 131* 130*  K 4.3 4.2  CL 99 98  CO2 23 23  GLUCOSE 107* 92  BUN 15 17  CREATININE 0.91 0.86  CALCIUM  9.1 9.5    Intake/Output Summary (Last 24 hours) at 11/11/2023 1619 Last data filed at 11/11/2023 1423 Gross per 24 hour  Intake 100 ml  Output 143 ml  Net -43 ml        Physical Exam: Vital Signs Blood pressure 130/70, pulse 80, temperature 99.2 F (37.3 C), temperature source Oral, resp. rate 20, height 5' 6.5 (1.689 m), weight 56.4 kg, SpO2 100%.   General: No acute distress working with therapy HEENT: Head is normocephalic, atraumatic, PERRLA, EOMI, sclera anicteric, oral mucosa dry Neck: Supple without JVD or lymphadenopathy Heart: Reg rate and rhythm. No murmurs rubs or gallops Chest: CTA bilaterally without wheezes, rales, or rhonchi; no distress Abdomen: Soft, non-tender, non-distended, bowel sounds positive. Extremities: No clubbing, cyanosis, or edema.  Psych: Flat Skin: Clean and intact without signs of breakdown Neuro: Alert and awake, delayed responses.  No increased muscle tone noted.  Decreased sensation light touch right lower extremity Decreased to light touch on RLE< but not RUE or face  RUE- 0/5  LUE 4+/5 throughout RLE- HF 1/5; KE/KF 0/5; DF 0/5; PF 2-/5 LLE- 4+/5 throughout Musculoskeletal: Minimal right shoulder tenderness, wearing shoulder sling    Assessment/Plan: 1. Functional deficits which require 3+ hours per day of interdisciplinary therapy in a  comprehensive inpatient rehab setting. Physiatrist is providing close team supervision and 24 hour management of active medical problems listed below. Physiatrist and rehab team continue to assess barriers to discharge/monitor patient progress toward functional and medical goals  Care Tool:  Bathing    Body parts bathed by patient: Right arm, Left arm, Chest, Abdomen, Front perineal area   Body parts bathed by helper: Abdomen, Chest, Front perineal area, Buttocks, Right upper leg, Left upper leg, Right lower leg, Left lower leg, Face     Bathing assist Assist Level: Total Assistance - Patient < 25%     Upper Body Dressing/Undressing Upper body dressing   What is the patient wearing?: Hospital gown only, Pull over shirt    Upper body assist Assist Level: Total Assistance - Patient < 25%    Lower Body Dressing/Undressing Lower body dressing      What is the patient wearing?: Underwear/pull up     Lower body assist Assist for lower body dressing: Total Assistance - Patient < 25%     Toileting Toileting    Toileting assist Assist for toileting: Total Assistance - Patient < 25%     Transfers Chair/bed transfer  Transfers assist  Chair/bed transfer activity did  not occur: Safety/medical concerns (fatigue)  Chair/bed transfer assist level: Total Assistance - Patient < 25%     Locomotion Ambulation   Ambulation assist   Ambulation activity did not occur: Safety/medical concerns          Walk 10 feet activity   Assist  Walk 10 feet activity did not occur: Safety/medical concerns        Walk 50 feet activity   Assist Walk 50 feet with 2 turns activity did not occur: Safety/medical concerns         Walk 150 feet activity   Assist Walk 150 feet activity did not occur: Safety/medical concerns         Walk 10 feet on uneven surface  activity   Assist Walk 10 feet on uneven surfaces activity did not occur: Safety/medical concerns          Wheelchair     Assist Is the patient using a wheelchair?: Yes Type of Wheelchair: Manual (TIS)    Wheelchair assist level: Dependent - Patient 0%      Wheelchair 50 feet with 2 turns activity    Assist        Assist Level: Dependent - Patient 0%   Wheelchair 150 feet activity     Assist      Assist Level: Dependent - Patient 0%   Blood pressure 130/70, pulse 80, temperature 99.2 F (37.3 C), temperature source Oral, resp. rate 20, height 5' 6.5 (1.689 m), weight 56.4 kg, SpO2 100%.  Medical Problem List and Plan: 1. Functional deficits secondary to  bilateral embolic shower, embolic pattern, etiology:  Afib and missed Eliquis  doses vs from NSTEMI with cardiac cath.              -patient may  shower             -ELOS/Goals: 12-15 days min A-              Cannot sit upright without propping             Admit to CIR  -Team conference today please see physician documentation under team conference tab, met with team  to discuss problems,progress, and goals. Formulized individual treatment plan based on medical history, underlying problem and comorbidities.   - Order PRAFO/WHO right upper extremity, sling for right upper extremity also ordered  2.  Antithrombotics: -DVT/anticoagulation:  Pharmaceutical: Eliquis  Transitioned from IV Heparin  to Eliquis  10/14             -antiplatelet therapy: Asprin 81 mg  3. Pain Management: Gabapentin  and Tylenol  as needed  -Voltaren  R shoulder for R shoulder pain  - 10/16 shoulder pain appears to be doing better today continue to monitor 4. Mood/Behavior/Sleep: LCSW to follow for evaluation and support when available.              -antipsychotic agents: N/A 5. Neuropsych/cognition: This patient is not quite capable of making decisions on her own behalf. 6. Skin/Wound Care: routine pressure relief measures.  7. Fluids/Electrolytes/Nutrition: monitor intake and output. Follow up labs in a.m.              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 Continue aspirin  and statin.   9. CAD: 10/4-10/7 admitted for NSTEMI  10. HLD: Lipitor 40 mg  11. HTN: monitor blood pressures on  Hyzaar, Cardizem , and Aldactone      11/11/2023    2:58 PM 11/11/2023  3:15 AM 11/10/2023    8:28 PM  Vitals with BMI  Systolic 133 125 869  Diastolic 57 58 70  Pulse 81 70 80   10/15-16  controlled on cardizem , monitor  12. Paroxymal Atrial Flutter/Fib: continue Eliquis  2.5 bid 13. T2DM: HgbA1c 6.0, resumed home Amaryl   - monitor cbg AC/HS with SSI.  CBG (last 3)  Recent Labs    11/10/23 1658 11/10/23 2200 11/11/23 0631  GLUCAP 101* 99 88  - 10/15 Will order CBG/SSI  - CBGs well-controlled continue to monitor for now  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  Hypothyroidism: Synthroid  17.  Chronic anemia: Stable, monitor CBC.  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 Constipation- will order Milk of Mg per pt request and Miralax  daily  -LBM 10/15 continue to monitor 20.  Hyponatremia  - 10/16 sodium 130, sodium appears chronically low going back several years.  Continue to monitor  LOS: 2 days A FACE TO FACE EVALUATION WAS PERFORMED  Escue Collier 11/11/2023, 4:19 PM

## 2023-11-11 NOTE — Progress Notes (Signed)
 Speech Language Pathology Daily Session Note  Patient Details  Name: CAMARYN LUMBERT MRN: [id number] Date of Birth: [birth year]  Today's Date: 11/11/2023 SLP Individual Time: 1300-1400 SLP Individual Time Calculation (min): 60 min  Short Term Goals: Week 1: SLP Short Term Goal 1 (Week 1): Patient will consume least restrictive diet with use of compensatory strategies given supervision multimodal A SLP Short Term Goal 2 (Week 1): Patient will increase speech intelligiblity to 70% at the phrase level given mod multimodal A SLP Short Term Goal 3 (Week 1): Patient will demonstrate problem solving skills in mildly complex situations given mod multimodal A SLP Short Term Goal 4 (Week 1): Patient will recall and utilize memory compensatory strategies given mod multimodal A  Skilled Therapeutic Interventions: Skilled therapy session focused on cognitive and communicative goals. SLP facilitated session by targeting problem solving goals through money management task. SLP provided minA for patient to count change according to verbalized amounts. Given minA, patient with 70% accuracy. SLP then targeted communication through education re: SLOP speech intelligibility strategies. SLP encouraged use at the phrase level. Patient achieved 70% accuracy given modA. Patient left in [facility] with alarm set and call bell in reach. Continue POC  Pain None reported   Therapy/Group: Individual Therapy  Caterine Mcmeans M.A., CCC-SLP 11/11/2023, 7:44 AM

## 2023-11-11 NOTE — Plan of Care (Signed)
  Problem: Consults Goal: RH STROKE PATIENT EDUCATION Description: See Patient Education module for education specifics  Outcome: Progressing   Problem: RH SAFETY Goal: RH STG ADHERE TO SAFETY PRECAUTIONS W/ASSISTANCE/DEVICE Description: STG Adhere to Safety Precautions With cues Assistance/Device. Outcome: Progressing   Problem: RH KNOWLEDGE DEFICIT Goal: RH STG INCREASE KNOWLEDGE OF DIABETES Description: Patient and dtr will be able to manage DM using educational resources for medications and dietary modification independently Outcome: Progressing Goal: RH STG INCREASE KNOWLEDGE OF HYPERTENSION Description: Patient and dtr will be able to manage HTN using educational resources for medications and dietary modification independently Outcome: Progressing Goal: RH STG INCREASE KNOWLEGDE OF HYPERLIPIDEMIA Description: Patient and dtr will be able to manage HLD using educational resources for medications and dietary modification independently Outcome: Progressing Goal: RH STG 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 Description: Patient and dtr will be able to manage secondary risks using educational resources for medications and dietary modification independently Outcome: Progressing   Problem: Education: Goal: Ability to describe self-care measures that may prevent or decrease complications (Diabetes Survival Skills Education) will improve Outcome: Progressing Goal: Individualized Educational Video(s) Outcome: Progressing   Problem: Coping: Goal: Ability to adjust to condition or change in health will improve Outcome: Progressing   Problem: Fluid Volume: Goal: Ability to maintain a balanced intake and output will improve Outcome: Progressing   Problem: Health Behavior/Discharge Planning: Goal: Ability to identify and utilize available resources and services will improve Outcome: Progressing Goal: Ability to manage health-related needs will improve Outcome: Progressing    Problem: Metabolic: Goal: Ability to maintain appropriate glucose levels will improve Outcome: Progressing   Problem: Nutritional: Goal: Maintenance of adequate nutrition will improve Outcome: Progressing Goal: Progress toward achieving an optimal weight will improve Outcome: Progressing   Problem: Skin Integrity: Goal: Risk for impaired skin integrity will decrease Outcome: Progressing   Problem: Tissue Perfusion: Goal: Adequacy of tissue perfusion will improve Outcome: Progressing

## 2023-11-11 NOTE — Progress Notes (Signed)
 Inpatient Rehabilitation Center Individual Statement of Services  Patient Name:  [name]  Date:  11/11/2023  Welcome to the Inpatient Rehabilitation Center.  Our goal is to provide you with an individualized program based on your diagnosis and situation, designed to meet your specific needs.  With this comprehensive rehabilitation program, you will be expected to participate in at least 3 hours of rehabilitation therapies Monday-Friday, with modified therapy programming on the weekends.  Your rehabilitation program will include the following services:  Physical Therapy (PT), Occupational Therapy (OT), Speech Therapy (ST), 24 hour per day rehabilitation nursing, Therapeutic Recreaction (TR), Neuropsychology, Care Coordinator, Rehabilitation Medicine, Nutrition Services, and Pharmacy Services  Weekly team conferences will be held on Wednesday to discuss your progress.  Your Inpatient Rehabilitation Care Coordinator will talk with you frequently to get your input and to update you on team discussions.  Team conferences with you and your family in attendance may also be held.  Expected length of stay: 12-15 days  Overall anticipated outcome: Minimal Assistance - Patient > 75%   Depending on your progress and recovery, your program may change. Your Inpatient Rehabilitation Care Coordinator will coordinate services and will keep you informed of any changes. Your Inpatient Rehabilitation Care Coordinator's name and contact numbers are listed  below.  The following services may also be recommended but are not provided by the Inpatient Rehabilitation Center:  Driving Evaluations Home Health Rehabiltiation Services Outpatient Rehabilitation Services Vocational Rehabilitation   Arrangements will be made to provide these services after discharge if needed.  Arrangements include referral to agencies that provide these services.  Your insurance has been verified to be:  [employer] MEDICARE /  DREMA DUAL COMPLETE  Your primary doctor is:  [name], MD  Pertinent information will be shared with your doctor and your insurance company.  Inpatient Rehabilitation Care Coordinator:  [name] [name] SIERRAS [phone] or ELIGAH BRINKS  Information discussed with and copy given to patient by: Waverly [name], 11/11/2023, 1:32 PM

## 2023-11-12 DIAGNOSIS — M25512 Pain in left shoulder: Secondary | ICD-10-CM

## 2023-11-12 LAB — GLUCOSE, CAPILLARY
Glucose-Capillary: 92 mg/dL (ref 70–99)
Glucose-Capillary: 105 mg/dL — ABNORMAL HIGH (ref 70–99)
Glucose-Capillary: 85 mg/dL (ref 70–99)
Glucose-Capillary: 98 mg/dL (ref 70–99)

## 2023-11-12 NOTE — Progress Notes (Signed)
 Physical Therapy Session Note  Patient Details  Name: [name] MRN: [id number] Date of Birth: 18-Jan-1941  Today's Date: 11/12/2023 PT Individual Time: 0802-0900, [id number] PT Individual Time Calculation (min): 58 min, 43 min   Short Term Goals: Week 1:  PT Short Term Goal 1 (Week 1): pt will toelrate sitting in TIS WC between sessions PT Short Term Goal 2 (Week 1): Pt will perform bed to chair transfer with LRAD and max A PT Short Term Goal 3 (Week 1): pt will perform sit to stand with LRAD and max A +1  Skilled Therapeutic Interventions/Progress Updates:      Treatment 1:  Pt supine in bed upon arrival. Pt reported 8/10 pain in her L shoulder but agreeable to therapy. Nursing administered medication during session.  Session emphasized functional strengthening and trunk control with bed mobility, transfers, and sitting balance.  Pt incontinent of bladder in brief. Pt performed R rolling with use of bed rail with CGA and L rolling with total A in order for PT to change brief. Total A required for donning pants.  Pt sat to EOB with max A and verbal/tactile cueing required for task sequencing and technique.  Pt performed squat pivot transfer EOB to TIS with max A and verbal cueing for head hip ratio and proper foot placement.  Pt transported dependent in [facility] to main gym for time/energy conservation. Pt performed squat pivot transfer TIS <> mat with mod/max A and verbal cueing for head hip ratio and proper foot placement.  Pt sat EOM with use of L UE to maintain seated balance with close supervision, however, R trunk lean noted with fatigue requiring CGA and verbal cueing for pt to correct. Pt able to extend R knee through partial ROM from seated position.  Upon return to room, pt remained seated in TIS, seatbelt alarm on, and all needs within reach on L side upon PT exiting room.  Treatment 2:  Pt supine in bed upon arrival. Pt denies pain and agreeable to therapy.  Session  focused on sit to stand transfers and standing tolerance to facilitate improved R LE proprioception through weight bearing and trunk/postural control.  Pt performed sit to stand x 3 trials with use of L UE on handrail in hallway and total A - pt R UE around PT shoulder. Pt required manual blocking of R knee to prevent buckling. Significant verbal and tactile cueing also provided/required for neck/trunk/hip/knee extension to achieve upright standing position. Mirror also placed in front of patient for visual feedback. Pt demonstrated a heavy R trunk lean.  Pt performed sit to stand transfers from perched position in [place] x 3 trials with use of L UE and max-total A. Verbal and tactile cueing also required for neck/trunk/hip/knee extension to achieve upright standing position. Pt continues to demonstrated a heavy R trunk lean.  Pt transported dependent back to room in TIS for time/energy conservation. Pt requested to remain seated at end of session. Seatbelt alarm on and all needs within reach on L side upon PT exiting room.  Therapy Documentation Precautions:  Precautions Precautions: Fall Recall of Precautions/Restrictions: Intact Precaution/Restrictions Comments: SBP <180, R hemiparesis Required Braces or Orthoses: Other Brace Other Brace: R LE PRAFO, R UE sling, R UE resting hand splint Restrictions Weight Bearing Restrictions Per Provider Order: No  Therapy/Group: Individual Therapy  [name], DPT 11/12/2023, 7:37 AM

## 2023-11-12 NOTE — Plan of Care (Signed)
  Problem: Consults Goal: RH STROKE PATIENT EDUCATION Description: See Patient Education module for education specifics  Outcome: Progressing   Problem: RH SAFETY Goal: RH STG ADHERE TO SAFETY PRECAUTIONS W/ASSISTANCE/DEVICE Description: STG Adhere to Safety Precautions With cues Assistance/Device. Outcome: Progressing   Problem: RH KNOWLEDGE DEFICIT Goal: RH STG INCREASE KNOWLEDGE OF DIABETES Description: Patient and dtr will be able to manage DM using educational resources for medications and dietary modification independently Outcome: Progressing Goal: RH STG INCREASE KNOWLEDGE OF HYPERTENSION Description: Patient and dtr will be able to manage HTN using educational resources for medications and dietary modification independently Outcome: Progressing Goal: RH STG INCREASE KNOWLEGDE OF HYPERLIPIDEMIA Description: Patient and dtr will be able to manage HLD using educational resources for medications and dietary modification independently Outcome: Progressing Goal: RH STG 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 Description: Patient and dtr will be able to manage secondary risks using educational resources for medications and dietary modification independently Outcome: Progressing   Problem: Education: Goal: Ability to describe self-care measures that may prevent or decrease complications (Diabetes Survival Skills Education) will improve Outcome: Progressing Goal: Individualized Educational Video(s) Outcome: Progressing   Problem: Coping: Goal: Ability to adjust to condition or change in health will improve Outcome: Progressing   Problem: Fluid Volume: Goal: Ability to maintain a balanced intake and output will improve Outcome: Progressing   Problem: Health Behavior/Discharge Planning: Goal: Ability to identify and utilize available resources and services will improve Outcome: Progressing Goal: Ability to manage health-related needs will improve Outcome: Progressing    Problem: Metabolic: Goal: Ability to maintain appropriate glucose levels will improve Outcome: Progressing   Problem: Nutritional: Goal: Maintenance of adequate nutrition will improve Outcome: Progressing Goal: Progress toward achieving an optimal weight will improve Outcome: Progressing   Problem: Skin Integrity: Goal: Risk for impaired skin integrity will decrease Outcome: Progressing   Problem: Tissue Perfusion: Goal: Adequacy of tissue perfusion will improve Outcome: Progressing

## 2023-11-12 NOTE — Progress Notes (Signed)
 Speech Language Pathology Daily Session Note  Patient Details  Name: [name] MRN: [id number] Date of Birth: 06-Oct-1940  Today's Date: 11/12/2023 SLP Individual Time: 0916-1000 SLP Individual Time Calculation (min): 44 min  Short Term Goals: Week 1: SLP Short Term Goal 1 (Week 1): Patient will consume least restrictive diet with use of compensatory strategies given supervision multimodal A SLP Short Term Goal 2 (Week 1): Patient will increase speech intelligiblity to 70% at the phrase level given mod multimodal A SLP Short Term Goal 3 (Week 1): Patient will demonstrate problem solving skills in mildly complex situations given mod multimodal A SLP Short Term Goal 4 (Week 1): Patient will recall and utilize memory compensatory strategies given mod multimodal A  Skilled Therapeutic Interventions: SLP conducted skilled therapy session targeting cognition, communication, and swallowing goals. SLP reviewed SLOP speech intelligibility strategies and functional phrases provided at previous session. Using visual aid, patient read speech strategies to SLP and then benefited from mod assist to utilize them consistently during functional phrase production. Patient requested graham crackers, thus SLP assessed tolerance of current regular/thin liquid diet. Patient edentulous this date and states her dentures are at home. She exhibited significantly prolonged mastication with graham cracker but eventually achieved full oral clearance. She exhibited immediate cough with thin liquids x1 but otherwise no overt s/sx of penetration/aspiration observed. Patient may benefit from MBS during stay but will continue to monitor. SLP introduced patient to [facility] memory strategies and provided with handout to facilitate carryover. She required max cues to come up with examples of utilizing each strategy functionally. Patient then utilized strategies to recall pictured information given a 2 minute memorization time with 60%  accuracy. Patient was left in room with call bell in reach and alarm set. SLP will continue to target goals per plan of care.        Pain Pain Assessment Pain Scale: 0-10 Pain Score: 8  Pain Location: Shoulder Pain Intervention(s): Medication (See eMAR)  Therapy/Group: Individual Therapy  [name], M.A., CCC-SLP  [name] 11/12/2023, 10:06 AM

## 2023-11-12 NOTE — IPOC Note (Signed)
 Overall Plan of Care [facility]) Patient Details Name: [name] MRN: [id number] DOB: 11/20/1940  Admitting Diagnosis: Acute right hemiparesis [facility])  Hospital Problems: Principal Problem:   Acute right hemiparesis (HCC) Active Problems:   Cerebellar stroke, acute (HCC)     Functional Problem List: Nursing Safety, Endurance, Medication Management  PT Balance, Edema, Endurance, Motor, Pain, Perception, Safety, Sensory, Skin Integrity  OT Balance, Perception, Safety, Cognition, Sensory, Endurance, Motor, Skin Integrity  SLP Cognition, Linguistic, Nutrition  TR         Basic ADL's: OT Toileting, Dressing, Bathing, Eating, Grooming     Advanced  ADL's: OT       Transfers: PT Bed Mobility, Bed to Chair, Customer service manager, Tub/Shower     Locomotion: PT Ambulation, Psychologist, prison and probation services, Stairs     Additional Impairments: OT Fuctional Use of Upper Extremity  SLP Swallowing, Communication, Social Cognition expression Problem Solving, Memory  TR      Anticipated Outcomes Item Anticipated Outcome  Self Feeding SU  Swallowing  mod i   Basic self-care  min A to mod A  Toileting  mod A   Bathroom Transfers min A  Bowel/Bladder  n/a  Transfers  min A  Locomotion  mod A  Communication  min  Cognition  min  Pain  n/a  Safety/Judgment  manage safety w cues   Therapy Plan: PT Intensity: Minimum of 1-2 x/day ,45 to 90 minutes PT Frequency: 5 out of 7 days PT Duration Estimated Length of Stay: 2-3 weeks OT Intensity: Minimum of 1-2 x/day, 45 to 90 minutes OT Frequency: 5 out of 7 days OT Duration/Estimated Length of Stay: 2-3 weeks SLP Intensity: Minumum of 1-2 x/day, 30 to 90 minutes SLP Frequency: 3 to 5 out of 7 days SLP Duration/Estimated Length of Stay: 12-15 days   Team Interventions: Nursing Interventions Patient/Family Education, Medication Management, Discharge Planning, Disease Management/Prevention  PT interventions Ambulation/gait training, Community  reintegration, DME/adaptive equipment instruction, Neuromuscular re-education, Psychosocial support, Stair training, UE/LE Strength taining/ROM, Wheelchair propulsion/positioning, Warden/ranger, Discharge planning, Functional electrical stimulation, Pain management, Skin care/wound management, Therapeutic Activities, Cognitive remediation/compensation, Disease management/prevention, Functional mobility training, Patient/family education, Splinting/orthotics, Therapeutic Exercise, Visual/perceptual remediation/compensation, UE/LE Coordination activities  OT Interventions UE/LE Strength taining/ROM, Wheelchair propulsion/positioning, Splinting/orthotics, Psychosocial support, Neuromuscular re-education, DME/adaptive equipment instruction, Community reintegration, Visual/perceptual remediation/compensation, UE/LE Coordination activities, Therapeutic Activities, Self Care/advanced ADL retraining, Pain management, Discharge planning, Warden/ranger, Functional electrical stimulation, Therapeutic Exercise, Patient/family education, Functional mobility training, Disease mangement/prevention, Cognitive remediation/compensation  SLP Interventions Cognitive remediation/compensation, Dysphagia/aspiration precaution training, Internal/external aids, Speech/Language facilitation, Therapeutic Activities, Functional tasks, Multimodal communication approach, Patient/family education  TR Interventions    SW/CM Interventions Discharge Planning, Psychosocial Support, Patient/Family Education, Disease Management/Prevention   Barriers to Discharge MD  Medical stability  Nursing Decreased caregiver support, Home environment access/layout 1 level 1 ste bil rails; dtr to assist; has DME  PT Incontinence, Home environment access/layout, Pending chemo/radiation pain  OT Other (comments) fatigue/ inability to wb due to flaccidity  SLP      SW       Team Discharge Planning: Destination: PT-Home  ,OT- Home , SLP-Home Projected Follow-up: PT-Home health PT, OT-  24 hour supervision/assistance, Home health OT, SLP-Home Health SLP, Outpatient SLP Projected Equipment Needs: PT-To be determined, OT- 3 in 1 bedside comode, SLP-None recommended by SLP Equipment Details: PT-need to verify DME with family, OT-  Patient/family involved in discharge planning: PT- Patient,  OT-Patient unable/family or caregiver not available, SLP-Patient  MD ELOS: 2-3 weeks  Medical Rehab Prognosis:  Good Assessment: The patient has been admitted for CIR therapies with the diagnosis of bilateral embolic shower, embolic pattern, etiology: Afib and missed Eliquis  doses vs from NSTEMI with cardiac cath . The team will be addressing functional mobility, strength, stamina, balance, safety, adaptive techniques and equipment, self-care, bowel and bladder mgt, patient and caregiver education. Goals have been set at min A. Anticipated discharge destination is home.        See Team Conference Notes for weekly updates to the plan of care

## 2023-11-12 NOTE — Progress Notes (Signed)
 Occupational Therapy Session Note  Patient Details  Name: [name] MRN: [id number] Date of Birth: 1940/12/21  Today's Date: 11/12/2023 OT Individual Time: 1032-1130 OT Individual Time Calculation (min): 58 min    Short Term Goals: Week 1:  OT Short Term Goal 1 (Week 1): patient will complete UB dressing with max A OT Short Term Goal 2 (Week 1): patient will complete grooming with mod A OT Short Term Goal 3 (Week 1): patient will complete functional toilet transfer to bsc max A OT Short Term Goal 4 (Week 1): patient will complete toileting on [facility] or toilet with total A  Skilled Therapeutic Interventions/Progress Updates:  Patient agreeable to participate in OT session. Reports no pain level.   Patient participated in skilled OT session focusing on neuromuscular re education, standing balance, . Therapist received patient in room in wc patient agreeable to participation. Taken to gym for [employer]. Patient engaged in UE NMR sitting at table with PROM table slides with OT, due to flaccidity. Patient then transitioned to mat table for weight bearing and shifting on UE. OT facilitated increased core strength and balance on mat table during static and dynamic sitting to increase ability to engage in ADLs. Patient required min to mod with static balance, and mod to max with dynamic balance. Patient completed standing in parallel bars with OT facilitation for upright positioning and buttocks tightening to increase pelvic positioning. Patient returned to bed, max A to transfer to bed. All needs in reach, alarm on.     Therapy Documentation Precautions:  Precautions Precautions: Fall Recall of Precautions/Restrictions: Intact Precaution/Restrictions Comments: SBP <180, R hemiparesis Required Braces or Orthoses: Other Brace Other Brace: R LE PRAFO, R UE sling, R UE resting hand splint Restrictions Weight Bearing Restrictions Per Provider Order: No  Therapy/Group: Individual Therapy  [name] 11/12/2023, 7:30 AM

## 2023-11-12 NOTE — Progress Notes (Signed)
 PROGRESS NOTE    Subjective/Complaints: Working with therapy this AM.  Previously had had pain in her right shoulder now having some pain with her left shoulder movements.  ROS: Right shoulder pain-improved, worsening pain in her left shoulder Denies abdominal pain, nausea, vomiting, chest pain or shortness of breath   Objective:   No results found. Recent Labs    11/10/23 0504 11/11/23 0558  WBC 5.0 4.9  HGB 9.8* 10.0*  HCT 29.5* 29.9*  PLT 147* 155   Recent Labs    11/10/23 0504 11/11/23 0558  NA 131* 130*  K 4.3 4.2  CL 99 98  CO2 23 23  GLUCOSE 107* 92  BUN 15 17  CREATININE 0.91 0.86  CALCIUM  9.1 9.5    Intake/Output Summary (Last 24 hours) at 11/12/2023 1649 Last data filed at 11/12/2023 1251 Gross per 24 hour  Intake 177 ml  Output --  Net 177 ml        Physical Exam: Vital Signs Blood pressure 130/70, pulse 80, temperature 99.2 F (37.3 C), temperature source Oral, resp. rate 20, height 5' 6.5 (1.689 m), weight 56.4 kg, SpO2 100%.   General: No acute distress working with therapy I gym HEENT: Head is normocephalic, atraumatic, PERRLA, EOMI, sclera anicteric, oral mucosa dry Neck: Supple without JVD or lymphadenopathy Heart: Reg rate and rhythm. No murmurs rubs or gallops Chest: CTA bilaterally without wheezes, rales, or rhonchi; no distress Abdomen: Soft, non-tender, non-distended, bowel sounds positive. Extremities: No clubbing, cyanosis, or edema.  Psych: Flat Skin: Clean and intact without signs of breakdown Neuro: Alert and awake, delayed responses.  No increased muscle tone noted.  Decreased sensation light touch right lower extremity Decreased to light touch on RLE< but not RUE or face  RUE- 0/5  LUE 4+/5 throughout RLE- HF 1/5; KE/KF 1-2/5; DF 0/5; PF 2-/5 LLE- 4+/5 throughout  Musculoskeletal: Minimal right shoulder tenderness, mild tenderness around her left shoulder and pain  with ROM Improved sitting balance noted today   Assessment/Plan: 1. Functional deficits which require 3+ hours per day of interdisciplinary therapy in a comprehensive inpatient rehab setting. Physiatrist is providing close team supervision and 24 hour management of active medical problems listed below. Physiatrist and rehab team continue to assess barriers to discharge/monitor patient progress toward functional and medical goals  Care Tool:  Bathing    Body parts bathed by patient: Right arm, Left arm, Chest, Abdomen, Front perineal area   Body parts bathed by helper: Abdomen, Chest, Front perineal area, Buttocks, Right upper leg, Left upper leg, Right lower leg, Left lower leg, Face     Bathing assist Assist Level: Total Assistance - Patient < 25%     Upper Body Dressing/Undressing Upper body dressing   What is the patient wearing?: Hospital gown only, Pull over shirt    Upper body assist Assist Level: Total Assistance - Patient < 25%    Lower Body Dressing/Undressing Lower body dressing      What is the patient wearing?: Underwear/pull up     Lower body assist Assist for lower body dressing: Total Assistance - Patient < 25%     Toileting Toileting    Toileting assist Assist  for toileting: Total Assistance - Patient < 25%     Transfers Chair/bed transfer  Transfers assist  Chair/bed transfer activity did not occur: Safety/medical concerns (fatigue)  Chair/bed transfer assist level: Total Assistance - Patient < 25%     Locomotion Ambulation   Ambulation assist   Ambulation activity did not occur: Safety/medical concerns          Walk 10 feet activity   Assist  Walk 10 feet activity did not occur: Safety/medical concerns        Walk 50 feet activity   Assist Walk 50 feet with 2 turns activity did not occur: Safety/medical concerns         Walk 150 feet activity   Assist Walk 150 feet activity did not occur: Safety/medical  concerns         Walk 10 feet on uneven surface  activity   Assist Walk 10 feet on uneven surfaces activity did not occur: Safety/medical concerns         Wheelchair     Assist Is the patient using a wheelchair?: Yes Type of Wheelchair: Manual (TIS)    Wheelchair assist level: Dependent - Patient 0%      Wheelchair 50 feet with 2 turns activity    Assist        Assist Level: Dependent - Patient 0%   Wheelchair 150 feet activity     Assist      Assist Level: Dependent - Patient 0%   Blood pressure 130/70, pulse 80, temperature 99.2 F (37.3 C), temperature source Oral, resp. rate 20, height 5' 6.5 (1.689 m), weight 56.4 kg, SpO2 100%.  Medical Problem List and Plan: 1. Functional deficits secondary to  bilateral embolic shower, embolic pattern, etiology:  Afib and missed Eliquis  doses vs from NSTEMI with cardiac cath.              -patient may  shower             -ELOS/Goals: 12-15 days min A-              Cannot sit upright without propping             Admit to CIR  -Team conference today please see physician documentation under team conference tab, met with team  to discuss problems,progress, and goals. Formulized individual treatment plan based on medical history, underlying problem and comorbidities.   - Order PRAFO/WHO right upper extremity, sling for right upper extremity also ordered  2.  Antithrombotics: -DVT/anticoagulation:  Pharmaceutical: Eliquis  Transitioned from IV Heparin  to Eliquis  10/14             -antiplatelet therapy: Asprin 81 mg  3. Pain Management: Gabapentin  and Tylenol  as needed  -Voltaren  R shoulder for R shoulder pain  - 10/17 patient now reporting some pain in her left shoulder, suspect from increased use with therapy.  Right shoulder doing better.  Will try Voltaren  gel to left shoulder, consider imaging if does not improve 4. Mood/Behavior/Sleep: LCSW to follow for evaluation and support when available.               -antipsychotic agents: N/A 5. Neuropsych/cognition: This patient is not quite capable of making decisions on her own behalf. 6. Skin/Wound Care: routine pressure relief measures.  7. Fluids/Electrolytes/Nutrition: monitor intake and output. Follow up labs in a.m.              - SLP consult: Carb modified diet + Ensure  -nutrition consult for decreased  p.o. 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 Continue aspirin  and statin.   9. CAD: 10/4-10/7 admitted for NSTEMI  10. HLD: Lipitor 40 mg  11. HTN: monitor blood pressures on  Hyzaar, Cardizem , and Aldactone      11/12/2023    1:52 PM 11/12/2023    4:50 AM 11/11/2023    8:02 PM  Vitals with BMI  Systolic 121 134 881  Diastolic 61 59 62  Pulse 91 74 84   10/15-17  controlled on cardizem , monitor  12. Paroxymal Atrial Flutter/Fib: continue Eliquis  2.5 bid 13. T2DM: HgbA1c 6.0, resumed home Amaryl   - monitor cbg AC/HS with SSI.  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  Hypothyroidism: Synthroid  17.  Chronic anemia: Stable, monitor CBC.   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 Constipation- will order Milk of Mg per pt request and Miralax  daily  -LBM 10/16 continue to monitor 20.  Hyponatremia  - 10/16 sodium 130, sodium appears chronically low going back several years.  Continue to monitor  LOS: 3 days A FACE TO FACE EVALUATION WAS PERFORMED  Cuccaro Collier 11/12/2023, 4:49 PM

## 2023-11-13 DIAGNOSIS — E440 Moderate protein-calorie malnutrition: Secondary | ICD-10-CM

## 2023-11-13 DIAGNOSIS — M7918 Myalgia, other site: Secondary | ICD-10-CM

## 2023-11-13 DIAGNOSIS — K5901 Slow transit constipation: Secondary | ICD-10-CM

## 2023-11-13 LAB — GLUCOSE, CAPILLARY
Glucose-Capillary: 93 mg/dL (ref 70–99)
Glucose-Capillary: 94 mg/dL (ref 70–99)
Glucose-Capillary: 80 mg/dL (ref 70–99)
Glucose-Capillary: 101 mg/dL — ABNORMAL HIGH (ref 70–99)

## 2023-11-13 MED ORDER — MEGESTROL ACETATE 400 MG/10ML PO SUSP
400.0000 mg | Freq: Two times a day (BID) | ORAL | Status: DC
  Administered 2023-11-13 – 2023-12-09 (×45): 400 mg via ORAL
  Filled 2023-11-13 (×64): qty 10

## 2023-11-13 NOTE — Progress Notes (Signed)
 PROGRESS NOTE    Subjective/Complaints: Pt with shoulder pain but also left neck/occipital discomfort  ROS: Patient denies fever, rash, sore throat, blurred vision, dizziness, nausea, vomiting, diarrhea, cough, shortness of breath or chest pain,   or mood change.    Objective:   No results found. Recent Labs    11/11/23 0558  WBC 4.9  HGB 10.0*  HCT 29.9*  PLT 155   Recent Labs    11/11/23 0558  NA 130*  K 4.2  CL 98  CO2 23  GLUCOSE 92  BUN 17  CREATININE 0.86  CALCIUM  9.5    Intake/Output Summary (Last 24 hours) at 11/13/2023 1012 Last data filed at 11/13/2023 0800 Gross per 24 hour  Intake 347 ml  Output --  Net 347 ml        Physical Exam: Vital Signs Blood pressure 130/70, pulse 80, temperature 99.2 F (37.3 C), temperature source Oral, resp. rate 20, height 5' 6.5 (1.689 m), weight 56.4 kg, SpO2 100%.   Constitutional: No distress . Vital signs reviewed. HEENT: NCAT, EOMI, oral membranes moist Neck: supple Cardiovascular: RRR without murmur. No JVD    Respiratory/Chest: CTA Bilaterally without wheezes or rales. Normal effort    GI/Abdomen: BS +, non-tender, non-distended Ext: no clubbing, cyanosis, or edema Psych: flat but cooperative  Skin: Clean and intact without signs of breakdown Neuro: Alert and awake, delayed responses.  No increased muscle tone noted.  Decreased sensation light touch right lower extremity Decreased to light touch on RLE< but not RUE or face  RUE- 0/5  LUE 4+/5 throughout RLE- HF 1/5; KE/KF 1-2/5; DF 0/5; PF 2-/5 LLE- 4+/5 throughout  Musculoskeletal: Minimal right shoulder tenderness, mild tenderness around her left shoulder and pain with ROM, right upper trap tender,taut.  Improved sitting balance noted today   Assessment/Plan: 1. Functional deficits which require 3+ hours per day of interdisciplinary therapy in a comprehensive inpatient rehab  setting. Physiatrist is providing close team supervision and 24 hour management of active medical problems listed below. Physiatrist and rehab team continue to assess barriers to discharge/monitor patient progress toward functional and medical goals  Care Tool:  Bathing    Body parts bathed by patient: Right arm, Left arm, Chest, Abdomen, Front perineal area   Body parts bathed by helper: Abdomen, Chest, Front perineal area, Buttocks, Right upper leg, Left upper leg, Right lower leg, Left lower leg, Face     Bathing assist Assist Level: Total Assistance - Patient < 25%     Upper Body Dressing/Undressing Upper body dressing   What is the patient wearing?: Hospital gown only, Pull over shirt    Upper body assist Assist Level: Total Assistance - Patient < 25%    Lower Body Dressing/Undressing Lower body dressing      What is the patient wearing?: Underwear/pull up     Lower body assist Assist for lower body dressing: Total Assistance - Patient < 25%     Toileting Toileting    Toileting assist Assist for toileting: Total Assistance - Patient < 25%     Transfers Chair/bed transfer  Transfers assist  Chair/bed transfer activity did not occur: Safety/medical concerns (fatigue)  Chair/bed transfer  assist level: Total Assistance - Patient < 25%     Locomotion Ambulation   Ambulation assist   Ambulation activity did not occur: Safety/medical concerns          Walk 10 feet activity   Assist  Walk 10 feet activity did not occur: Safety/medical concerns        Walk 50 feet activity   Assist Walk 50 feet with 2 turns activity did not occur: Safety/medical concerns         Walk 150 feet activity   Assist Walk 150 feet activity did not occur: Safety/medical concerns         Walk 10 feet on uneven surface  activity   Assist Walk 10 feet on uneven surfaces activity did not occur: Safety/medical concerns         Wheelchair     Assist  Is the patient using a wheelchair?: Yes Type of Wheelchair: Manual (TIS)    Wheelchair assist level: Dependent - Patient 0%      Wheelchair 50 feet with 2 turns activity    Assist        Assist Level: Dependent - Patient 0%   Wheelchair 150 feet activity     Assist      Assist Level: Dependent - Patient 0%   Blood pressure 130/70, pulse 80, temperature 99.2 F (37.3 C), temperature source Oral, resp. rate 20, height 5' 6.5 (1.689 m), weight 56.4 kg, SpO2 100%.  Medical Problem List and Plan: 1. Functional deficits secondary to  bilateral embolic shower, embolic pattern, etiology:  Afib and missed Eliquis  doses vs from NSTEMI with cardiac cath.              -patient may  shower             -ELOS/Goals: 12-15 days min A-              Cannot sit upright without propping             -Continue CIR therapies including PT, OT, and SLP   - Ordered PRAFO/WHO right upper extremity, sling for right upper extremity also ordered  2.  Antithrombotics: -DVT/anticoagulation:  Pharmaceutical: Eliquis  Transitioned from IV Heparin  to Eliquis  10/14             -antiplatelet therapy: Asprin 81 mg  3. Pain Management: Gabapentin  and Tylenol  as needed  -Voltaren  R shoulder for R shoulder pain  - 10/17 patient now reporting some pain in her left shoulder, suspect from increased use with therapy.  Right shoulder doing better.  Will try Voltaren  gel to left shoulder, consider imaging if does not improve  10/18 add kpad for neck/shoulder as well 4. Mood/Behavior/Sleep: LCSW to follow for evaluation and support when available.              -antipsychotic agents: N/A 5. Neuropsych/cognition: This patient is not quite capable of making decisions on her own behalf. 6. Skin/Wound Care: routine pressure relief measures.  7. Fluids/Electrolytes/Nutrition: monitor intake and output. Follow up labs in a.m.              - SLP consult: Carb modified diet + Ensure  -nutrition consult for decreased  p.o. intake  10/18 intake remains minimal. 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 Continue aspirin  and statin.   9. CAD: 10/4-10/7 admitted for NSTEMI  10. HLD: Lipitor 40 mg  11. HTN: monitor blood pressures on  Hyzaar, Cardizem ,  and Aldactone      11/13/2023    5:25 AM 11/12/2023    8:09 PM 11/12/2023    1:52 PM  Vitals with BMI  Systolic 122 122 878  Diastolic 58 62 61  Pulse 78 84 91   10/15-18  controlled on cardizem , monitor  12. Paroxymal Atrial Flutter/Fib: continue Eliquis  2.5 bid 13. T2DM: HgbA1c 6.0, resumed home Amaryl   - monitor cbg AC/HS with SSI.  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  Hypothyroidism: Synthroid  17.  Chronic anemia: Stable, monitor CBC.   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 Constipation- will order Milk of Mg per pt request and Miralax  daily  -LBM 10/16 continue to monitor  10/18 needs to eat!! 20.  Hyponatremia  - 10/16 sodium 130, sodium appears chronically low going back several years.  Continue to monitor  LOS: 4 days A FACE TO FACE EVALUATION WAS PERFORMED  [name] 11/13/2023, 10:12 AM

## 2023-11-13 NOTE — Plan of Care (Signed)
  Problem: Consults Goal: RH STROKE PATIENT EDUCATION Description: See Patient Education module for education specifics  Outcome: Progressing   Problem: RH SAFETY Goal: RH STG ADHERE TO SAFETY PRECAUTIONS W/ASSISTANCE/DEVICE Description: STG Adhere to Safety Precautions With cues Assistance/Device. Outcome: Progressing   Problem: RH KNOWLEDGE DEFICIT Goal: RH STG INCREASE KNOWLEDGE OF DIABETES Description: Patient and dtr will be able to manage DM using educational resources for medications and dietary modification independently Outcome: Progressing Goal: RH STG INCREASE KNOWLEDGE OF HYPERTENSION Description: Patient and dtr will be able to manage HTN using educational resources for medications and dietary modification independently Outcome: Progressing Goal: RH STG INCREASE KNOWLEGDE OF HYPERLIPIDEMIA Description: Patient and dtr will be able to manage HLD using educational resources for medications and dietary modification independently Outcome: Progressing Goal: RH STG 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 Description: Patient and dtr will be able to manage secondary risks using educational resources for medications and dietary modification independently Outcome: Progressing   Problem: Education: Goal: Ability to describe self-care measures that may prevent or decrease complications (Diabetes Survival Skills Education) will improve Outcome: Progressing Goal: Individualized Educational Video(s) Outcome: Progressing   Problem: Coping: Goal: Ability to adjust to condition or change in health will improve Outcome: Progressing   Problem: Fluid Volume: Goal: Ability to maintain a balanced intake and output will improve Outcome: Progressing   Problem: Health Behavior/Discharge Planning: Goal: Ability to identify and utilize available resources and services will improve Outcome: Progressing Goal: Ability to manage health-related needs will improve Outcome: Progressing    Problem: Metabolic: Goal: Ability to maintain appropriate glucose levels will improve Outcome: Progressing   Problem: Nutritional: Goal: Maintenance of adequate nutrition will improve Outcome: Progressing Goal: Progress toward achieving an optimal weight will improve Outcome: Progressing   Problem: Skin Integrity: Goal: Risk for impaired skin integrity will decrease Outcome: Progressing   Problem: Tissue Perfusion: Goal: Adequacy of tissue perfusion will improve Outcome: Progressing

## 2023-11-13 NOTE — Progress Notes (Signed)
 Pt refuses medications at this time. Pt also refused lunch tray at 1230 this afternoon. MD notified.    Graceanna Theissen  VEAR Miyamoto, LPN

## 2023-11-14 LAB — GLUCOSE, CAPILLARY
Glucose-Capillary: 96 mg/dL (ref 70–99)
Glucose-Capillary: 135 mg/dL — ABNORMAL HIGH (ref 70–99)
Glucose-Capillary: 130 mg/dL — ABNORMAL HIGH (ref 70–99)
Glucose-Capillary: 143 mg/dL — ABNORMAL HIGH (ref 70–99)

## 2023-11-14 MED ORDER — ADULT MULTIVITAMIN W/MINERALS CH
1.0000 | ORAL_TABLET | Freq: Every day | ORAL | Status: DC
  Administered 2023-11-14 – 2023-11-20 (×5): 1 via ORAL
  Filled 2023-11-14 (×9): qty 1

## 2023-11-14 NOTE — Progress Notes (Signed)
 SLP Cancellation Note  Patient Details Name: [name] MRN: [id number] DOB: [birth year]   Cancelled treatment:        Unable to complete ST session d/t clinician illness. Another SLP team member will f/u at next available day/time.                                                                                                Waddell JONETTA Novak, MA CCC-SLP 11/14/2023, 10:13 AM

## 2023-11-14 NOTE — Progress Notes (Signed)
 PROGRESS NOTE    Subjective/Complaints: Pt says she slept, neck/shoulder more comfortable. Pt refused some meds yesterday but later took them. Doesn't like ensure. Po intake generally poor however  ROS: Limited due to cognitive/behavioral   Objective:   No results found. No results for input(s): WBC, HGB, HCT, PLT in the last 72 hours.  No results for input(s): NA, K, CL, CO2, GLUCOSE, BUN, CREATININE, CALCIUM  in the last 72 hours.   Intake/Output Summary (Last 24 hours) at 11/14/2023 0842 Last data filed at 11/14/2023 0752 Gross per 24 hour  Intake 188 ml  Output --  Net 188 ml        Physical Exam: Vital Signs Blood pressure (!) 122/59, pulse 76, temperature 99.2 F (37.3 C), resp. rate 18, height 5' 6.5 (1.689 m), weight 56.4 kg, SpO2 100%.   Constitutional: No distress . Vital signs reviewed. HEENT: NCAT, EOMI, oral membranes moist Neck: supple Cardiovascular: RRR without murmur. No JVD    Respiratory/Chest: CTA Bilaterally without wheezes or rales. Normal effort    GI/Abdomen: BS +, non-tender, non-distended Ext: no clubbing, cyanosis, or edema Psych: flat but cooperative  Skin: Clean and intact without signs of breakdown Neuro: fairly alert, delayed responses.  No increased muscle tone noted.  Decreased sensation light touch right lower extremity Decreased to light touch on RLE< but not RUE or face  RUE- 0/5  LUE 4+/5 throughout RLE- HF 1/5; KE/KF 1-2/5; DF 0/5; PF 2-/5 LLE- 4+/5 throughout  Musculoskeletal: Minimal right shoulder tenderness, still some mild tenderness around her left shoulder and pain with ROM, right upper trap tender,taut.      Assessment/Plan: 1. Functional deficits which require 3+ hours per day of interdisciplinary therapy in a comprehensive inpatient rehab setting. Physiatrist is providing close team supervision and 24 hour management of active medical  problems listed below. Physiatrist and rehab team continue to assess barriers to discharge/monitor patient progress toward functional and medical goals  Care Tool:  Bathing    Body parts bathed by patient: Right arm, Left arm, Chest, Abdomen, Front perineal area   Body parts bathed by helper: Abdomen, Chest, Front perineal area, Buttocks, Right upper leg, Left upper leg, Right lower leg, Left lower leg, Face     Bathing assist Assist Level: Total Assistance - Patient < 25%     Upper Body Dressing/Undressing Upper body dressing   What is the patient wearing?: Hospital gown only, Pull over shirt    Upper body assist Assist Level: Total Assistance - Patient < 25%    Lower Body Dressing/Undressing Lower body dressing      What is the patient wearing?: Underwear/pull up     Lower body assist Assist for lower body dressing: Total Assistance - Patient < 25%     Toileting Toileting    Toileting assist Assist for toileting: Total Assistance - Patient < 25%     Transfers Chair/bed transfer  Transfers assist  Chair/bed transfer activity did not occur: Safety/medical concerns (fatigue)  Chair/bed transfer assist level: Total Assistance - Patient < 25%     Locomotion Ambulation   Ambulation assist   Ambulation activity did not occur: Safety/medical concerns  Walk 10 feet activity   Assist  Walk 10 feet activity did not occur: Safety/medical concerns        Walk 50 feet activity   Assist Walk 50 feet with 2 turns activity did not occur: Safety/medical concerns         Walk 150 feet activity   Assist Walk 150 feet activity did not occur: Safety/medical concerns         Walk 10 feet on uneven surface  activity   Assist Walk 10 feet on uneven surfaces activity did not occur: Safety/medical concerns         Wheelchair     Assist Is the patient using a wheelchair?: Yes Type of Wheelchair: Manual (TIS)    Wheelchair assist  level: Dependent - Patient 0%      Wheelchair 50 feet with 2 turns activity    Assist        Assist Level: Dependent - Patient 0%   Wheelchair 150 feet activity     Assist      Assist Level: Dependent - Patient 0%   Blood pressure (!) 122/59, pulse 76, temperature 99.2 F (37.3 C), resp. rate 18, height 5' 6.5 (1.689 m), weight 56.4 kg, SpO2 100%.  Medical Problem List and Plan: 1. Functional deficits secondary to  bilateral embolic shower, embolic pattern, etiology:  Afib and missed Eliquis  doses vs from NSTEMI with cardiac cath.              -patient may  shower             -ELOS/Goals: 12-15 days min A-              Cannot sit upright without propping            -Continue CIR therapies including PT, OT, and SLP   - Ordered PRAFO/WHO right upper extremity, sling for right upper extremity also ordered  2.  Antithrombotics: -DVT/anticoagulation:  Pharmaceutical: Eliquis  Transitioned from IV Heparin  to Eliquis  10/14             -antiplatelet therapy: Asprin 81 mg  3. Pain Management: Gabapentin  and Tylenol  as needed  -Voltaren  R shoulder for R shoulder pain  - 10/17 patient now reporting some pain in her left shoulder, suspect from increased use with therapy.  Right shoulder doing better.  Will try Voltaren  gel to left shoulder, consider imaging if does not improve  10/18 added kpad for neck/shoulder as well 4. Mood/Behavior/Sleep: LCSW to follow for evaluation and support when available.              -antipsychotic agents: N/A 5. Neuropsych/cognition: This patient is not quite capable of making decisions on her own behalf. 6. Skin/Wound Care: routine pressure relief measures.  7. Fluids/Electrolytes/Nutrition: monitor intake and output. Follow up labs in a.m.              - SLP consult: Carb modified diet + Ensure  -nutrition consult for decreased p.o. intake  10/18 intake remains minimal. RD following   -added megace for appetitte  10/19 obsv today. 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 Continue aspirin  and statin.   9. CAD: 10/4-10/7 admitted for NSTEMI  10. HLD: Lipitor 40 mg  11. HTN: monitor blood pressures on  Hyzaar, Cardizem , and Aldactone      11/14/2023    5:31 AM 11/13/2023    7:51 PM 11/13/2023    3:00 PM  Vitals with BMI  Systolic 121 115 877  Diastolic 63 60 59  Pulse 86 96 76   10/15-18  controlled on cardizem , monitor  12. Paroxymal Atrial Flutter/Fib: continue Eliquis  2.5 bid 13. T2DM: HgbA1c 6.0, resumed home Amaryl   - monitor cbg AC/HS with SSI.  CBG (last 3)  Recent Labs    11/13/23 1645 11/13/23 2103 11/14/23 0614  GLUCAP 80 101* 96  - 10/15 Will order CBG/SSI  - CBGs well-controlled, holding amaryl . 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  Hypothyroidism: Synthroid  17.  Chronic anemia: Stable, monitor CBC.   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 Constipation- will order Milk of Mg per pt request and Miralax  daily  -LBM 10/16 continue to monitor  10/19 needs to eat!!--will give 30cc sorbitol today 20.  Hyponatremia  - 10/16 sodium 130, sodium appears chronically low going back several years.  Continue to monitor  LOS: 5 days A FACE TO FACE EVALUATION WAS PERFORMED  Arthea ONEIDA Gunther 11/14/2023, 8:42 AM

## 2023-11-14 NOTE — Progress Notes (Signed)
 Initial Nutrition Assessment  DOCUMENTATION CODES:  Not applicable  INTERVENTION:  Liberalize diet to regular Encourage PO intake Mighty Shake TID with meals, each supplement provides 330 kcals and 9 grams of protein MVI with minerals daily  NUTRITION DIAGNOSIS:  Inadequate oral intake related to poor appetite as evidenced by meal completion < 25%.  GOAL:  Patient will meet greater than or equal to 90% of their needs  MONITOR:  PO intake, Supplement acceptance, I & O's, Labs  REASON FOR ASSESSMENT:  Consult Poor PO  ASSESSMENT:  Pt with hx of lung cancer (undergoing treatment), DM type 2, GERD, lupus, hx breast cancer, recent NSTEMI, and CAD admitted to CIR after admission for CVA.  Noted pt with 2 admissions to [facility] in October (10/4-10/7 for NSTEMI, 10/11-10/14 for CVA). Also has been seen by RD at [facility]. Poor PO intake and weight loss have been ongoing issues for months.  RD working remotely. Unable to reach pt at this time.   Reviewed chart and noted that pt with poor intake since admit to CIR but has been going on since at least August when initial RD consult was requested at [facility]. Pt currently on restrictive diet. Will liberalize and add nutrition supplements. Also noted that pt refusing ensure. Appears to have been consuming outpatient. Will discontinue at this time and follow-up for options that may be more appealing. Will add mighty shakes to tray.  Admit / Current weight: 56.4 kg   12.4% weight loss noted in the last ~3.5 months which is severe and concerning (7/1-10/14)  Average Meal Intake: 10/16-10/19: 17% intake x 7 recorded meals  Nutritionally Relevant Medications: Scheduled Meds:  anastrozole   1 mg Oral Daily   apixaban   2.5 mg Oral BID   atorvastatin   40 mg Oral Daily   Ensure Plus High Protein  237 mL Oral BID BM   insulin  aspart  0-6 Units Subcutaneous TID WC   levothyroxine   100 mcg Oral Daily   megestrol  400 mg Oral BID    polyethylene glycol  17 g Oral Daily   PRN Meds: alum & mag hydroxide-simeth, bisacodyl, diphenhydrAMINE , magnesium  hydroxide, prochlorperazine, senna-docusate, sodium phosphate   Labs Reviewed: CBG ranges from 80-101 mg/dL over the last 24 hours HgbA1c 6.0% (10/11)  NUTRITION - FOCUSED PHYSICAL EXAM: Defer to in-person assessment  Diet Order:   Diet Order             Diet heart healthy/carb modified Room service appropriate? Yes; Fluid consistency: Thin  Diet effective now                   EDUCATION NEEDS:  Not appropriate for education at this time  Skin:  Skin Assessment: Reviewed RN Assessment  Last BM:  10/16  Height:  Ht Readings from Last 1 Encounters:  11/09/23 5' 6.5 (1.689 m)    Weight:  Wt Readings from Last 1 Encounters:  11/09/23 56.4 kg    Ideal Body Weight:  59.1 kg  BMI:  Body mass index is 19.77 kg/m.  Estimated Nutritional Needs:  Kcal:  1700-1900 kcal/d Protein:  80-100g/d Fluid:  >/=1.7L/d    Vernell Lukes, RD, LDN, CNSC Registered Dietitian II Please reach out via secure chat

## 2023-11-14 NOTE — Progress Notes (Signed)
 Occupational Therapy Session Note  Patient Details  Name: [name] MRN: [id number] Date of Birth: 1940/09/19  Today's Date: 11/14/2023 OT Individual Time: 1401-1457 OT Individual Time Calculation (min): 56 min    Short Term Goals: Week 1:  OT Short Term Goal 1 (Week 1): patient will complete UB dressing with max A OT Short Term Goal 2 (Week 1): patient will complete grooming with mod A OT Short Term Goal 3 (Week 1): patient will complete functional toilet transfer to bsc max A OT Short Term Goal 4 (Week 1): patient will complete toileting on BSC or toilet with total A  Skilled Therapeutic Interventions/Progress Updates:     Pt received resting in bed with DTR present in room. Pt presenting to be tired, however in good spirits receptive to skilled OT session reporting 0/10 pain- OT offering intermittent rest breaks, repositioning, and therapeutic support to optimize participation in therapy session. Pt requesting to get washed up this session. Focused session on ADL retraining with emphasis on dynamic sitting balance, trunk control, and body awareness. Transitioned to EOB with HOB elevated with light MAX A to fully bring B LEs to EOB and lift trunk. Once EOB, Pt able to maintain sitting balance with feet supported on floor and L UE support on bed rail, however significantly rounded shoulders and forward head noted d/t trunk weakness. MAX multimodal cues required to facilitate upward posture with scapular retraction and head lifted. Doffed OH shirt with MOD A using hemi-technique following education. Sit > stand MAX A x1 to doff pants- maintained standing balance MAX while +2 assisted with doffing brief/pants. When utilizing L UE during U/LB bathing tasks, increased assistance MOD-MAX A required for dynamic sitting balance 2/2 fatigue and decreased trunk control. Donned OH shirt following education on hemi-techniques with MAX A- Pt participating in weaving B UEs. Pt able to assist with weaving  feet into pants, however requiring assistance d/t Pt fearful when reaching towards ground. Stood with L UE support on back of chair with MAX A, pants pulled to waist total A, and MAX A required for sitting balance. Pt very fatigued by end of session and requesting to return to bed. EOB > supine MAX A. Spent time at end of session positioning Pt in bed to increase comfort. R PRAFO and resting hand splint donned. Pt was left resting in bed with call bell in reach, bed alarm on, and all needs met.    Therapy Documentation Precautions:  Precautions Precautions: Fall Recall of Precautions/Restrictions: Intact Precaution/Restrictions Comments: SBP <180, R hemiparesis Required Braces or Orthoses: Other Brace Other Brace: R LE PRAFO, R UE sling, R UE resting hand splint Restrictions Weight Bearing Restrictions Per Provider Order: No   Therapy/Group: Individual Therapy  [name] 11/14/2023, 3:08 PM

## 2023-11-14 NOTE — Plan of Care (Signed)
  Problem: Consults Goal: RH STROKE PATIENT EDUCATION Description: See Patient Education module for education specifics  Outcome: Progressing   Problem: RH SAFETY Goal: RH STG ADHERE TO SAFETY PRECAUTIONS W/ASSISTANCE/DEVICE Description: STG Adhere to Safety Precautions With cues Assistance/Device. Outcome: Progressing   Problem: RH KNOWLEDGE DEFICIT Goal: RH STG INCREASE KNOWLEDGE OF DIABETES Description: Patient and dtr will be able to manage DM using educational resources for medications and dietary modification independently Outcome: Progressing Goal: RH STG INCREASE KNOWLEDGE OF HYPERTENSION Description: Patient and dtr will be able to manage HTN using educational resources for medications and dietary modification independently Outcome: Progressing Goal: RH STG INCREASE KNOWLEGDE OF HYPERLIPIDEMIA Description: Patient and dtr will be able to manage HLD using educational resources for medications and dietary modification independently Outcome: Progressing Goal: RH STG 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 Description: Patient and dtr will be able to manage secondary risks using educational resources for medications and dietary modification independently Outcome: Progressing   Problem: Education: Goal: Ability to describe self-care measures that may prevent or decrease complications (Diabetes Survival Skills Education) will improve Outcome: Progressing Goal: Individualized Educational Video(s) Outcome: Progressing   Problem: Coping: Goal: Ability to adjust to condition or change in health will improve Outcome: Progressing   Problem: Fluid Volume: Goal: Ability to maintain a balanced intake and output will improve Outcome: Progressing   Problem: Health Behavior/Discharge Planning: Goal: Ability to identify and utilize available resources and services will improve Outcome: Progressing Goal: Ability to manage health-related needs will improve Outcome: Progressing    Problem: Metabolic: Goal: Ability to maintain appropriate glucose levels will improve Outcome: Progressing   Problem: Nutritional: Goal: Maintenance of adequate nutrition will improve Outcome: Progressing Goal: Progress toward achieving an optimal weight will improve Outcome: Progressing   Problem: Skin Integrity: Goal: Risk for impaired skin integrity will decrease Outcome: Progressing   Problem: Tissue Perfusion: Goal: Adequacy of tissue perfusion will improve Outcome: Progressing

## 2023-11-14 NOTE — Progress Notes (Signed)
 Physical Therapy Session Note  Patient Details  Name: [name] MRN: [id number] Date of Birth: Oct 09, 1940  Today's Date: 11/14/2023 PT Individual Time: 0757-0912 PT Individual Time Calculation (min): 75 min   Short Term Goals: Week 1:  PT Short Term Goal 1 (Week 1): pt will toelrate sitting in TIS WC between sessions PT Short Term Goal 2 (Week 1): Pt will perform bed to chair transfer with LRAD and max A PT Short Term Goal 3 (Week 1): pt will perform sit to stand with LRAD and max A +1  Skilled Therapeutic Interventions/Progress Updates:     Pt received supine in bed; denies pain and is agreeable to therapy. Pt has more R LE movement but no right UE movement. She can perform a R quad set but needs L LE to perform it as well. R heel slide active assistive ROM with pt able to initiate and bend slightly. Needs constant verbal cues to continue. R hip abduction harder to do but has initiation but max A to finish. Therex: L quad set x 15, L heel slide x 15, L hip abduction x 15, R heel slide x 15, R hip abduction x 15, bridge x 15 with PT holding R LE in position, bil hip IR/ER AROM with knees extended with pt able to perform but ER R more difficulty. Sitting EOB tolerance activities performed with PT min to moderate assist x 14 minutes statically and also with forward/backward/lateral weightshifts as facilitated by PT. Manual therapy: PROM R UE/LE all joints without pain reported.  Supine to sit max A; sit to supine mod A; scooting up in bed dependent. Sling worn for standing activities.  Pt left in bed, bed alarm on, no complaints of pain, all needs within reach.      Therapy Documentation Precautions:  Precautions Precautions: Fall Recall of Precautions/Restrictions: Intact Precaution/Restrictions Comments: SBP <180, R hemiparesis Required Braces or Orthoses: Other Brace Other Brace: R LE PRAFO, R UE sling, R UE resting hand splint Restrictions Weight Bearing Restrictions Per  Provider Order: No      Therapy/Group: Individual Therapy  [name] 11/14/2023, 7:25 AM

## 2023-11-15 DIAGNOSIS — R7989 Other specified abnormal findings of blood chemistry: Secondary | ICD-10-CM

## 2023-11-15 LAB — BASIC METABOLIC PANEL WITH GFR
Anion gap: 11 (ref 5–15)
BUN: 31 mg/dL — ABNORMAL HIGH (ref 8–23)
CO2: 23 mmol/L (ref 22–32)
Calcium: 9.8 mg/dL (ref 8.9–10.3)
Chloride: 97 mmol/L — ABNORMAL LOW (ref 98–111)
Creatinine, Ser: 1.04 mg/dL — ABNORMAL HIGH (ref 0.44–1.00)
GFR, Estimated: 54 mL/min — ABNORMAL LOW (ref >60)
Glucose, Bld: 122 mg/dL — ABNORMAL HIGH (ref 70–99)
Potassium: 4.2 mmol/L (ref 3.5–5.1)
Sodium: 131 mmol/L — ABNORMAL LOW (ref 135–145)

## 2023-11-15 LAB — CBC
HCT: 30.7 % — ABNORMAL LOW (ref 36.0–46.0)
Hemoglobin: 10.5 g/dL — ABNORMAL LOW (ref 12.0–15.0)
MCH: 29.2 pg (ref 26.0–34.0)
MCHC: 34.2 g/dL (ref 30.0–36.0)
MCV: 85.5 fL (ref 80.0–100.0)
Platelets: 173 K/uL (ref 150–400)
RBC: 3.59 MIL/uL — ABNORMAL LOW (ref 3.87–5.11)
RDW: 16.7 % — ABNORMAL HIGH (ref 11.5–15.5)
WBC: 5.1 K/uL (ref 4.0–10.5)
nRBC: 0 % (ref 0.0–0.2)

## 2023-11-15 LAB — PREALBUMIN: Prealbumin: 21 mg/dL (ref 18–38)

## 2023-11-15 LAB — GLUCOSE, CAPILLARY
Glucose-Capillary: 114 mg/dL — ABNORMAL HIGH (ref 70–99)
Glucose-Capillary: 110 mg/dL — ABNORMAL HIGH (ref 70–99)
Glucose-Capillary: 137 mg/dL — ABNORMAL HIGH (ref 70–99)
Glucose-Capillary: 107 mg/dL — ABNORMAL HIGH (ref 70–99)

## 2023-11-15 MED ORDER — METFORMIN HCL 500 MG PO TABS: 500.0000 mg | ORAL_TABLET | Freq: Every day | ORAL | Status: DC

## 2023-11-15 MED ORDER — SODIUM CHLORIDE 0.9 % IV SOLN: INTRAVENOUS | Status: DC

## 2023-11-15 NOTE — Plan of Care (Signed)
  Problem: Consults Goal: RH STROKE PATIENT EDUCATION Description: See Patient Education module for education specifics  Outcome: Progressing   Problem: RH SAFETY Goal: RH STG ADHERE TO SAFETY PRECAUTIONS W/ASSISTANCE/DEVICE Description: STG Adhere to Safety Precautions With cues Assistance/Device. Outcome: Progressing   Problem: RH KNOWLEDGE DEFICIT Goal: RH STG INCREASE KNOWLEDGE OF DIABETES Description: Patient and dtr will be able to manage DM using educational resources for medications and dietary modification independently Outcome: Progressing Goal: RH STG INCREASE KNOWLEDGE OF HYPERTENSION Description: Patient and dtr will be able to manage HTN using educational resources for medications and dietary modification independently Outcome: Progressing Goal: RH STG INCREASE KNOWLEGDE OF HYPERLIPIDEMIA Description: Patient and dtr will be able to manage HLD using educational resources for medications and dietary modification independently Outcome: Progressing Goal: RH STG 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 Description: Patient and dtr will be able to manage secondary risks using educational resources for medications and dietary modification independently Outcome: Progressing   Problem: Education: Goal: Ability to describe self-care measures that may prevent or decrease complications (Diabetes Survival Skills Education) will improve Outcome: Progressing Goal: Individualized Educational Video(s) Outcome: Progressing   Problem: Coping: Goal: Ability to adjust to condition or change in health will improve Outcome: Progressing   Problem: Fluid Volume: Goal: Ability to maintain a balanced intake and output will improve Outcome: Progressing   Problem: Health Behavior/Discharge Planning: Goal: Ability to identify and utilize available resources and services will improve Outcome: Progressing Goal: Ability to manage health-related needs will improve Outcome: Progressing    Problem: Metabolic: Goal: Ability to maintain appropriate glucose levels will improve Outcome: Progressing   Problem: Nutritional: Goal: Maintenance of adequate nutrition will improve Outcome: Progressing Goal: Progress toward achieving an optimal weight will improve Outcome: Progressing   Problem: Skin Integrity: Goal: Risk for impaired skin integrity will decrease Outcome: Progressing   Problem: Tissue Perfusion: Goal: Adequacy of tissue perfusion will improve Outcome: Progressing

## 2023-11-15 NOTE — Progress Notes (Signed)
 Occupational Therapy Session Note  Patient Details  Name: [name] MRN: [id number] Date of Birth: [birth year]  Today's Date: 11/15/2023 OT Individual Time: 1102-1200 OT Individual Time Calculation (min): 58 min    Short Term Goals: Week 1:  OT Short Term Goal 1 (Week 1): patient will complete UB dressing with max A OT Short Term Goal 2 (Week 1): patient will complete grooming with mod A OT Short Term Goal 3 (Week 1): patient will complete functional toilet transfer to bsc max A OT Short Term Goal 4 (Week 1): patient will complete toileting on [facility] or toilet with total A  Skilled Therapeutic Interventions/Progress Updates: Patient received resting in bed. Agreeable to OT treatment. Patient assisted to EOB for LE dressing. Patient able to maintain balance sitting while therapist threaded RLE into pant leg. Patient assisted with lifting LLE into pant leg. Stood assist of one with another assisting with pulling up pants. Continued treatment in therapy gym. Squat pivot to therapy mat Max of one to the R side for scapular mobilization seated EOM in preparation for  wt bearing and pre-reach facilitation. Patient with significant tightness in R pecs, MFR and scapular adduction stretch to facilitate opening up chest for improved posture. Continued with IR ER stretching/GH mobilizations. Following mobilization worked with patient on facilitating forward reach. Patient wihtout c/o pain during reach with humerus in 90 degrees flexion. Continued with assisted shoulder flexion tolerated without c/o to 140 degrees. Patient with fatigue in sitting maintaining unsupported sitting EOM. Provided midback-rest for continued LUE functional reach with RUE on mat in support. Assist provided at elbow and radial aspect of wrist to use RUE as a support for LUE reaching. Assisted patient back to w/c with Max of one to the left squat pivot. Patient returned to room. Left with call bell in lap and with tray table set up for  lunch. Continue with skilled OT POC.      Therapy Documentation Precautions:  Precautions Precautions: Fall Recall of Precautions/Restrictions: Intact Precaution/Restrictions Comments: SBP <180, R hemiparesis Required Braces or Orthoses: Other Brace Other Brace: R LE PRAFO, R UE sling, R UE resting hand splint Restrictions Weight Bearing Restrictions Per Provider Order: No General:   Vital Signs: Therapy Vitals Temp: 98.4 F (36.9 C) Temp Source: Oral Pulse Rate: 87 Resp: 17 BP: (!) 128/56 Patient Position (if appropriate): Lying Oxygen  Therapy SpO2: 100 % O2 Device: Room Air Pain: Pain Assessment Pain Scale: 0-10 Pain Score: 0-No pain ADL: ADL Equipment Provided: Reacher Eating: Supervision/safety (food needs to be SU) Grooming: Moderate assistance Where Assessed-Grooming: Sitting at sink, Wheelchair Upper Body Bathing: Moderate assistance Where Assessed-Upper Body Bathing: Sitting at sink Upper Body Dressing: Maximal assistance Where Assessed-Upper Body Dressing: Sitting at sink Lower Body Dressing: Dependent Where Assessed-Lower Body Dressing: Bed level Toileting: Dependent Where Assessed-Toileting: Bed level Toilet Transfer: Dependent, Maximal assistance Toilet Transfer Method: Squat pivot Toilet Transfer Equipment: Therapist, sports Transfer: Maximal assistance, Dependent    Therapy/Group: Individual Therapy  [name] 11/15/2023, 12:14 PM

## 2023-11-15 NOTE — Progress Notes (Signed)
 Physical Therapy Session Note  Patient Details  Name: [name] MRN: [id number] Date of Birth: [birth year]  Today's Date: 11/15/2023 PT Individual Time: 1421-1525 PT Individual Time Calculation (min): 64 min   Short Term Goals: Week 1:  PT Short Term Goal 1 (Week 1): pt will toelrate sitting in TIS WC between sessions PT Short Term Goal 2 (Week 1): Pt will perform bed to chair transfer with LRAD and max A PT Short Term Goal 3 (Week 1): pt will perform sit to stand with LRAD and max A +1  Skilled Therapeutic Interventions/Progress Updates:      Pt supine in bed upon arrival. Pt agreeable to therapy. Pt denies any pain.   Pt overall demonstrates increased fatigue today from previous sessions.   Supine to sit with total A, max verbal cues provided for initiation and to sustain attention 2/2 increased fatigue.   Seated balance: max A, heavy lateral trunk lean to R, HOH provided to L UE for improved initiation.  Squat pivot transfer bed to TIS max A  Donned L UE sling.   Pt performed sit to stand x2 in standing frame with +2 A; pt tolerated standing for 1x ~ 3-4 min and 1x 1-2 min with max verbal and tactile cues to facilitate upright posture, midline orientation, lateral weight shifting. PT utilized colored cones positioned to pt L to improve pt attention/participation 2/2 fatigue.   Sit to supine with total A; donned R UE resting hand splint, R LE PRAFO.   Pt supine in bed with all needs within reach and bed alarm on.   Therapy Documentation Precautions:  Precautions Precautions: Fall Recall of Precautions/Restrictions: Intact Precaution/Restrictions Comments: SBP <180, R hemiparesis Required Braces or Orthoses: Other Brace Other Brace: R LE PRAFO, R UE sling, R UE resting hand splint Restrictions Weight Bearing Restrictions Per Provider Order: No   Therapy/Group: Individual Therapy  [facility] [name], , DPT  11/15/2023, 7:56 AM

## 2023-11-15 NOTE — Progress Notes (Signed)
 PROGRESS NOTE    Subjective/Complaints: She is eating breakfast this am with SLP in the room. No new complaints. Shoulder pain is improved,Voltaren  gel is helping.   ROS: Limited due to cognitive/behavioral   Objective:   No results found. Recent Labs    11/15/23 0653  WBC 5.1  HGB 10.5*  HCT 30.7*  PLT 173    Recent Labs    11/15/23 0653  NA 131*  K 4.2  CL 97*  CO2 23  GLUCOSE 122*  BUN 31*  CREATININE 1.04*  CALCIUM  9.8     Intake/Output Summary (Last 24 hours) at 11/15/2023 0942 Last data filed at 11/15/2023 0825 Gross per 24 hour  Intake 330 ml  Output --  Net 330 ml        Physical Exam: Vital Signs Blood pressure (!) 128/56, pulse 87, temperature 98.4 F (36.9 C), temperature source Oral, resp. rate 17, height 5' 6.5 (1.689 m), weight 56.4 kg, SpO2 100%.   Constitutional: No distress . Vital signs reviewed. HEENT: NCAT, EOMI, oral membranes moist Neck: supple Cardiovascular: RRR without murmur. No JVD    Respiratory/Chest: CTA Bilaterally without wheezes or rales. Normal effort    GI/Abdomen: BS +, non-tender, non-distended Ext: no clubbing, cyanosis, or edema Psych: flat but cooperative  Skin: Clean and intact without signs of breakdown Neuro: Awake and alert, delayed responses.  No increased muscle tone noted.  Decreased sensation light touch right lower extremity. Speech more clear today.  Decreased to light touch on RLE< but not RUE or face  RUE- 0/5  LUE 4+/5 throughout RLE- HF 1/5; KE/KF 1-2/5; DF 0/5; PF 2-/5 LLE- 4+/5 throughout  Musculoskeletal: Minimal right shoulder tenderness, still some mild tenderness around her left shoulder and pain with ROM, right upper trap tender,taut.      Assessment/Plan: 1. Functional deficits which require 3+ hours per day of interdisciplinary therapy in a comprehensive inpatient rehab setting. Physiatrist is providing close team supervision  and 24 hour management of active medical problems listed below. Physiatrist and rehab team continue to assess barriers to discharge/monitor patient progress toward functional and medical goals  Care Tool:  Bathing    Body parts bathed by patient: Right arm, Left arm, Chest, Abdomen, Front perineal area   Body parts bathed by helper: Abdomen, Chest, Front perineal area, Buttocks, Right upper leg, Left upper leg, Right lower leg, Left lower leg, Face     Bathing assist Assist Level: Total Assistance - Patient < 25%     Upper Body Dressing/Undressing Upper body dressing   What is the patient wearing?: Hospital gown only, Pull over shirt    Upper body assist Assist Level: Total Assistance - Patient < 25%    Lower Body Dressing/Undressing Lower body dressing      What is the patient wearing?: Underwear/pull up     Lower body assist Assist for lower body dressing: Total Assistance - Patient < 25%     Toileting Toileting    Toileting assist Assist for toileting: Total Assistance - Patient < 25%     Transfers Chair/bed transfer  Transfers assist  Chair/bed transfer activity did not occur: Safety/medical concerns (fatigue)  Chair/bed transfer assist  level: Total Assistance - Patient < 25%     Locomotion Ambulation   Ambulation assist   Ambulation activity did not occur: Safety/medical concerns          Walk 10 feet activity   Assist  Walk 10 feet activity did not occur: Safety/medical concerns        Walk 50 feet activity   Assist Walk 50 feet with 2 turns activity did not occur: Safety/medical concerns         Walk 150 feet activity   Assist Walk 150 feet activity did not occur: Safety/medical concerns         Walk 10 feet on uneven surface  activity   Assist Walk 10 feet on uneven surfaces activity did not occur: Safety/medical concerns         Wheelchair     Assist Is the patient using a wheelchair?: Yes Type of  Wheelchair: Manual (TIS)    Wheelchair assist level: Dependent - Patient 0%      Wheelchair 50 feet with 2 turns activity    Assist        Assist Level: Dependent - Patient 0%   Wheelchair 150 feet activity     Assist      Assist Level: Dependent - Patient 0%   Blood pressure (!) 128/56, pulse 87, temperature 98.4 F (36.9 C), temperature source Oral, resp. rate 17, height 5' 6.5 (1.689 m), weight 56.4 kg, SpO2 100%.  Medical Problem List and Plan: 1. Functional deficits secondary to  bilateral embolic shower, embolic pattern, etiology:  Afib and missed Eliquis  doses vs from NSTEMI with cardiac cath.              -patient may  shower             -ELOS/Goals: 12-15 days min A-              Cannot sit upright without propping            -Continue CIR therapies including PT, OT, and SLP   - Ordered PRAFO/WHO right upper extremity, sling for right upper extremity also ordered  2.  Antithrombotics: -DVT/anticoagulation:  Pharmaceutical: Eliquis  Transitioned from IV Heparin  to Eliquis  10/14             -antiplatelet therapy: Asprin 81 mg  3. Pain Management: Gabapentin  and Tylenol  as needed  -Voltaren  R shoulder for R shoulder pain  - 10/17 patient now reporting some pain in her left shoulder, suspect from increased use with therapy.  Right shoulder doing better.  Will try Voltaren  gel to left shoulder, consider imaging if does not improve  10/18 added kpad for neck/shoulder as well  -10/20 pt reports shoulder pain is controlled currently, continue current 4. Mood/Behavior/Sleep: LCSW to follow for evaluation and support when available.              -antipsychotic agents: N/A 5. Neuropsych/cognition: This patient is not quite capable of making decisions on her own behalf. 6. Skin/Wound Care: routine pressure relief measures.  7. Fluids/Electrolytes/Nutrition: monitor intake and output. Follow up labs in a.m.              - SLP consult: Carb modified diet +  Ensure  -nutrition consult for decreased p.o. intake  10/18 intake remains minimal. RD following   -added megace for appetitte  10/19 obsv today. Recheck labs, prealbumin in AM   10/20 Prealbumin 21 this AM, continue to monitor PO intake 8. Stroke: Embolic CVA. Echo  shows EF 60%. Continue aspirin  and statin.   9. CAD: 10/4-10/7 admitted for NSTEMI  10. HLD: Lipitor 40 mg  11. HTN: monitor blood pressures on  Hyzaar, Cardizem , and Aldactone      11/15/2023    5:54 AM 11/14/2023    8:46 PM 11/14/2023   10:28 AM  Vitals with BMI  Systolic 128 127 879  Diastolic 56 66 59  Pulse 87 97 86   10/15-20  controlled on cardizem , monitor  12. Paroxymal Atrial Flutter/Fib: continue Eliquis  2.5 bid 13. T2DM: HgbA1c 6.0, resumed home Amaryl   - monitor cbg AC/HS with SSI.  CBG (last 3)  Recent Labs    11/14/23 1631 11/14/23 2147 11/15/23 0607  GLUCAP 130* 143* 114*  - 10/15 Will order CBG/SSI  - CBGs well-controlled, holding amaryl . But not eating much  -10/20 controlled, continue current regimen  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  Hypothyroidism: Synthroid  17.  Chronic anemia: Stable, monitor CBC.   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 Constipation- will order Milk of Mg per pt request and Miralax  daily  -LBM 10/16 continue to monitor  10/19 needs to eat!!--will give 30cc sorbitol today  10/20 appears she did have BM 10/18- noted as bowel incontinence in chart but amount not documented, continue to monitor 20.  Hyponatremia  - 10/16 sodium 130, sodium appears chronically low going back several years.  Continue to monitor  -10/20 NA 131 today, continue to monitor  21. Azotemia  -10/20 IVF NS 50ml/hr  LOS: 6 days A FACE TO FACE EVALUATION WAS PERFORMED  Penafiel Collier 11/15/2023, 9:42 AM

## 2023-11-15 NOTE — Progress Notes (Signed)
 Speech Language Pathology Daily Session Note  Patient Details  Name: [name] MRN: [id number] Date of Birth: July 20, 1940  Today's Date: 11/15/2023 SLP Individual Time: 0830-0930 SLP Individual Time Calculation (min): 60 min  Short Term Goals: Week 1: SLP Short Term Goal 1 (Week 1): Patient will consume least restrictive diet with use of compensatory strategies given supervision multimodal A SLP Short Term Goal 2 (Week 1): Patient will increase speech intelligiblity to 70% at the phrase level given mod multimodal A SLP Short Term Goal 3 (Week 1): Patient will demonstrate problem solving skills in mildly complex situations given mod multimodal A SLP Short Term Goal 4 (Week 1): Patient will recall and utilize memory compensatory strategies given mod multimodal A  Skilled Therapeutic Interventions: SLP conducted skilled therapy session targeting dysarthria, cognition, and swallowing goals. Patient assisted with setup for breakfast tray items then consumed regular/thin liquid textures given extra time for oral preparation given edentulous state but otherwise tolerated with no overt s/sx of penetration/aspiration. During meal, SLP engaged patient in conversation re: biographical information, hospital events, and family with patient benefiting from only supervision assist to achieve 90% intelligibility. SLP reviewed WRAP memory strategies with patient benefiting from use of external aid to recall each strategy and examples for use. Patient then interpreted daily schedule during memory book setup given minA. During memory tasks, patient expressed urge to void, NT and SLP assisted patient with placement on bedpan where she was continent of bladder. Patient was left in room with call bell in reach and alarm set. SLP will continue to target goals per plan of care.        Pain Pain Assessment Pain Scale: 0-10 Pain Score: 0-No pain  Therapy/Group: Individual Therapy  Lynia Landry, M.A.,  CCC-SLP  Egan Sahlin A Adryanna Friedt 11/15/2023, 10:05 AM

## 2023-11-16 ENCOUNTER — Ambulatory Visit: Admitting: Emergency Medicine

## 2023-11-16 LAB — GLUCOSE, CAPILLARY
Glucose-Capillary: 104 mg/dL — ABNORMAL HIGH (ref 70–99)
Glucose-Capillary: 99 mg/dL (ref 70–99)
Glucose-Capillary: 115 mg/dL — ABNORMAL HIGH (ref 70–99)
Glucose-Capillary: 180 mg/dL — ABNORMAL HIGH (ref 70–99)

## 2023-11-16 MED ORDER — PANTOPRAZOLE SODIUM 40 MG PO TBEC
40.0000 mg | DELAYED_RELEASE_TABLET | Freq: Every day | ORAL | Status: DC
  Administered 2023-11-16 – 2023-11-21 (×6): 40 mg via ORAL
  Filled 2023-11-16 (×6): qty 1

## 2023-11-16 NOTE — Progress Notes (Signed)
 Physical Therapy Session Note  Patient Details  Name: [name] MRN: [id number] Date of Birth: [birth year]  Today's Date: 11/16/2023 PT Individual Time: 1016-1041 PT Individual Time Calculation (min): 25 min   Short Term Goals: Week 1:  PT Short Term Goal 1 (Week 1): pt will toelrate sitting in TIS WC between sessions PT Short Term Goal 2 (Week 1): Pt will perform bed to chair transfer with LRAD and max A PT Short Term Goal 3 (Week 1): pt will perform sit to stand with LRAD and max A +1  Skilled Therapeutic Interventions/Progress Updates:   Received pt sitting in TIS WC, pt agreeable to PT treatment, and denied any pain during session, just fatigue. Session with emphasis on functional mobility/transfers, generalized strengthening and endurance, dynamic standing balance/coordination, and NMR. Transported to/from room in TIS WC dependently. Worked on sit<>stands with L handrail x 3 trials with total A fading to max A using mirror for visual feedback blocking R knee. Worked on upright posture/gaze, midline orientation, weight shifting R, and R knee extension with mod A overall for standing balance. Noted mild R knee extensor tone and pushing to R when initially standing but able to correct with manual facilitation and verbal/tactile cues. Returned to room and concluded session with pt sitting in TIS WC, needs within reach, and seatbelt alarm on awaiting SLP session.   Therapy Documentation Precautions:  Precautions Precautions: Fall Recall of Precautions/Restrictions: Intact Precaution/Restrictions Comments: SBP <180, R hemiparesis Required Braces or Orthoses: Other Brace Other Brace: R LE PRAFO, R UE sling, R UE resting hand splint Restrictions Weight Bearing Restrictions Per Provider Order: No  Therapy/Group: Individual Therapy Therisa CHRISTELLA Zaunegger Therisa Stains PT, DPT 11/16/2023, 7:15 AM

## 2023-11-16 NOTE — Progress Notes (Signed)
 Physical Therapy Session Note  Patient Details  Name: [name] MRN: [id number] Date of Birth: 1940/07/21  Today's Date: 11/16/2023 PT Individual Time: 1300-1413 PT Individual Time Calculation (min): 73 min   Short Term Goals: Week 1:  PT Short Term Goal 1 (Week 1): pt will toelrate sitting in TIS WC between sessions PT Short Term Goal 1 - Progress (Week 1): Met PT Short Term Goal 2 (Week 1): Pt will perform bed to chair transfer with LRAD and max A PT Short Term Goal 2 - Progress (Week 1): Met PT Short Term Goal 3 (Week 1): pt will perform sit to stand with LRAD and max A +1 PT Short Term Goal 3 - Progress (Week 1): Met  Skilled Therapeutic Interventions/Progress Updates:      Pt seated in TIS WC upon arrival. Pt agreeable to therapy. Pt deneis any pain. Pt endorses increased fatigue.   Pt performed squat pivot transfer throughout session with +1 max A, verbal and tactile cues provided for anterior weight shift, and activation.   Pt performed mass practice of sit to stand from elevated mat table--with +1 max A, verbal and tactile cues provided for upright posture, trunk/glute/cervical extension.   Pt performed 2x10 standing lateral weight shift with L UE supported and verbal and tactile cues provided for R LE activation.   Pt guarding for R LE buckling throughout session.   Pt overall seated balance improved today with close supervision/min A.   Pt supine in bed with all needs within reach, and R LE PRAFO on at end fo session.   Therapy Documentation Precautions:  Precautions Precautions: Fall Recall of Precautions/Restrictions: Intact Precaution/Restrictions Comments: SBP <180, R hemiparesis Required Braces or Orthoses: Other Brace Other Brace: R LE PRAFO, R UE sling, R UE resting hand splint Restrictions Weight Bearing Restrictions Per Provider Order: No   Therapy/Group: Individual Therapy  [facility], [place], DPT  11/16/2023, 5:06 PM

## 2023-11-16 NOTE — Plan of Care (Signed)
  Problem: Consults Goal: RH STROKE PATIENT EDUCATION Description: See Patient Education module for education specifics  Outcome: Progressing   Problem: RH SAFETY Goal: RH STG ADHERE TO SAFETY PRECAUTIONS W/ASSISTANCE/DEVICE Description: STG Adhere to Safety Precautions With cues Assistance/Device. Outcome: Progressing   Problem: RH KNOWLEDGE DEFICIT Goal: RH STG INCREASE KNOWLEDGE OF DIABETES Description: Patient and dtr will be able to manage DM using educational resources for medications and dietary modification independently Outcome: Progressing Goal: RH STG INCREASE KNOWLEDGE OF HYPERTENSION Description: Patient and dtr will be able to manage HTN using educational resources for medications and dietary modification independently Outcome: Progressing Goal: RH STG INCREASE KNOWLEGDE OF HYPERLIPIDEMIA Description: Patient and dtr will be able to manage HLD using educational resources for medications and dietary modification independently Outcome: Progressing Goal: RH STG 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 Description: Patient and dtr will be able to manage secondary risks using educational resources for medications and dietary modification independently Outcome: Progressing   Problem: Education: Goal: Ability to describe self-care measures that may prevent or decrease complications (Diabetes Survival Skills Education) will improve Outcome: Progressing Goal: Individualized Educational Video(s) Outcome: Progressing   Problem: Coping: Goal: Ability to adjust to condition or change in health will improve Outcome: Progressing   Problem: Fluid Volume: Goal: Ability to maintain a balanced intake and output will improve Outcome: Progressing   Problem: Health Behavior/Discharge Planning: Goal: Ability to identify and utilize available resources and services will improve Outcome: Progressing Goal: Ability to manage health-related needs will improve Outcome: Progressing    Problem: Metabolic: Goal: Ability to maintain appropriate glucose levels will improve Outcome: Progressing   Problem: Nutritional: Goal: Maintenance of adequate nutrition will improve Outcome: Progressing Goal: Progress toward achieving an optimal weight will improve Outcome: Progressing   Problem: Skin Integrity: Goal: Risk for impaired skin integrity will decrease Outcome: Progressing   Problem: Tissue Perfusion: Goal: Adequacy of tissue perfusion will improve Outcome: Progressing

## 2023-11-16 NOTE — Progress Notes (Signed)
 Physical Therapy Weekly Progress Note  Patient Details  Name: [name] MRN: [id number] Date of Birth: 1940-03-03  Beginning of progress report period: November 10, 2023 End of progress report period: November 16, 2023  Patient has met 3 of 3 short term goals. Pt overall level of assist fluctuates with fatigue and attention. Pt is currently performing bed mobility with hospital bed features and max-total A, sit to stand with max-total A, squat pivot transfer with max-total A, seated balance with min-max A. Pt overall progress toward goals limited by weakness, dense R hemiparesis, poor postural control, R UE/LE inattention, and significant fatigue. Plan to schedule WC evaluation and family training prior to DC.   Patient continues to demonstrate the following deficits muscle weakness and muscle joint tightness, decreased cardiorespiratoy endurance, impaired timing and sequencing, abnormal tone, unbalanced muscle activation, decreased coordination, and decreased motor planning, decreased midline orientation, decreased attention to right, right side neglect, and decreased motor planning, decreased initiation, decreased attention, decreased awareness, decreased problem solving, and delayed processing, and decreased sitting balance, decreased standing balance, decreased postural control, hemiplegia, and decreased balance strategies and therefore will continue to benefit from skilled PT intervention to increase functional independence with mobility.  Patient progressing toward long term goals..  Continue plan of care.  PT Short Term Goals Week 1:  PT Short Term Goal 1 (Week 1): pt will toelrate sitting in TIS WC between sessions PT Short Term Goal 1 - Progress (Week 1): Met PT Short Term Goal 2 (Week 1): Pt will perform bed to chair transfer with LRAD and max A PT Short Term Goal 2 - Progress (Week 1): Met PT Short Term Goal 3 (Week 1): pt will perform sit to stand with LRAD and max A +1 PT Short  Term Goal 3 - Progress (Week 1): Met Week 2:  PT Short Term Goal 1 (Week 2): PT will trial lightweight manual WC and initiate WC evaluation PT Short Term Goal 2 (Week 2): Pt will perform bed to chair transfer with LRAD and mod A PT Short Term Goal 3 (Week 2): Pt will perform sit to stand with LRAD and mod A  Skilled Therapeutic Interventions/Progress Updates:      Therapy Documentation Precautions:  Precautions Precautions: Fall Recall of Precautions/Restrictions: Intact Precaution/Restrictions Comments: SBP <180, R hemiparesis Required Braces or Orthoses: Other Brace Other Brace: R LE PRAFO, R UE sling, R UE resting hand splint Restrictions Weight Bearing Restrictions Per Provider Order: No  Therapy/Group: Individual Therapy  [name], DPT 11/16/2023, 3:58 PM

## 2023-11-16 NOTE — Progress Notes (Signed)
 Speech Language Pathology Daily Session Note  Patient Details  Name: CHAZLYN CUDE MRN: [id number] Date of Birth: 1940/06/07  Today's Date: 11/16/2023 SLP Individual Time: 1100-1200 SLP Individual Time Calculation (min): 60 min  Short Term Goals: Week 1: SLP Short Term Goal 1 (Week 1): Patient will consume least restrictive diet with use of compensatory strategies given supervision multimodal A SLP Short Term Goal 2 (Week 1): Patient will increase speech intelligiblity to 70% at the phrase level given mod multimodal A SLP Short Term Goal 3 (Week 1): Patient will demonstrate problem solving skills in mildly complex situations given mod multimodal A SLP Short Term Goal 4 (Week 1): Patient will recall and utilize memory compensatory strategies given mod multimodal A  Skilled Therapeutic Interventions:   SLP conducted skilled therapy session targeting cognition goals. SLP facilitated memory book task where patient recalled activities completed in therapy sessions earlier in the day. Patient min to modA for recalling 1-2 events from day. SLP conducted a sequencing task of functional activities. Patient min to modA for verbally arranging 3-4 step sequences and mod to maxA for identifying and sequencing picture cards across 5/5 trials. Note accuracy likely impacted by patient fatigue, and per patient report, visual deficits may impact perception of items. Patient was left in room with call bell in reach and alarm set. SLP will continue to target goals per plan of care.    Pain   none  Therapy/Group: Individual Therapy  Ashley A Ellin 11/16/2023, 12:14 PM

## 2023-11-16 NOTE — Progress Notes (Addendum)
 PROGRESS NOTE    Subjective/Complaints: No new complaints this AM. Reports eat more for breakfast (80% documented) LBM yesterday.  ROS: Limited due to cognitive/behavioral. Denies CP/SOB  Objective:   No results found. Recent Labs    11/15/23 0653  WBC 5.1  HGB 10.5*  HCT 30.7*  PLT 173    Recent Labs    11/15/23 0653  NA 131*  K 4.2  CL 97*  CO2 23  GLUCOSE 122*  BUN 31*  CREATININE 1.04*  CALCIUM  9.8     Intake/Output Summary (Last 24 hours) at 11/16/2023 1451 Last data filed at 11/16/2023 1253 Gross per 24 hour  Intake 1343.02 ml  Output --  Net 1343.02 ml        Physical Exam: Vital Signs Blood pressure (!) 117/47, pulse 81, temperature 97.7 F (36.5 C), temperature source Oral, resp. rate 17, height 5' 6.5 (1.689 m), weight 56.4 kg, SpO2 100%.   Constitutional: No distress . Vital signs reviewed. Sitting in bed, working with therapy.  HEENT: NCAT, EOMI, oral membranes dry Neck: supple Cardiovascular: RRR without murmur. No JVD    Respiratory/Chest: CTA Bilaterally without wheezes or rales. Normal effort    GI/Abdomen: BS +, non-tender, non-distended Ext: no clubbing, cyanosis, or edema Psych: flat but cooperative  Skin: Clean and intact without signs of breakdown Neuro: Awake and alert, delayed responses.   Decreased sensation light touch right lower extremity. Speech more clear today.  Decreased to light touch on RLE< but not RUE or face  RUE- 0/5  LUE 4+/5 throughout RLE- HF 1/5; KE/KF 1-2/5; DF 0/5; PF 2-/5 LLE- 4+/5 throughout  Musculoskeletal: Minimal right shoulder tenderness, still some mild tenderness around her left shoulder and pain with ROM, right upper trap tender,taut.      Assessment/Plan: 1. Functional deficits which require 3+ hours per day of interdisciplinary therapy in a comprehensive inpatient rehab setting. Physiatrist is providing close team supervision and 24  hour management of active medical problems listed below. Physiatrist and rehab team continue to assess barriers to discharge/monitor patient progress toward functional and medical goals  Care Tool:  Bathing    Body parts bathed by patient: Right arm, Left arm, Chest, Abdomen, Front perineal area   Body parts bathed by helper: Abdomen, Chest, Front perineal area, Buttocks, Right upper leg, Left upper leg, Right lower leg, Left lower leg, Face     Bathing assist Assist Level: Total Assistance - Patient < 25%     Upper Body Dressing/Undressing Upper body dressing   What is the patient wearing?: Hospital gown only, Pull over shirt    Upper body assist Assist Level: Total Assistance - Patient < 25%    Lower Body Dressing/Undressing Lower body dressing      What is the patient wearing?: Underwear/pull up     Lower body assist Assist for lower body dressing: Total Assistance - Patient < 25%     Toileting Toileting    Toileting assist Assist for toileting: Total Assistance - Patient < 25%     Transfers Chair/bed transfer  Transfers assist  Chair/bed transfer activity did not occur: Safety/medical concerns (fatigue)  Chair/bed transfer assist level: Total Assistance - Patient <  25%     Locomotion Ambulation   Ambulation assist   Ambulation activity did not occur: Safety/medical concerns          Walk 10 feet activity   Assist  Walk 10 feet activity did not occur: Safety/medical concerns        Walk 50 feet activity   Assist Walk 50 feet with 2 turns activity did not occur: Safety/medical concerns         Walk 150 feet activity   Assist Walk 150 feet activity did not occur: Safety/medical concerns         Walk 10 feet on uneven surface  activity   Assist Walk 10 feet on uneven surfaces activity did not occur: Safety/medical concerns         Wheelchair     Assist Is the patient using a wheelchair?: Yes Type of Wheelchair:  Manual (TIS)    Wheelchair assist level: Dependent - Patient 0%      Wheelchair 50 feet with 2 turns activity    Assist        Assist Level: Dependent - Patient 0%   Wheelchair 150 feet activity     Assist      Assist Level: Dependent - Patient 0%   Blood pressure (!) 117/47, pulse 81, temperature 97.7 F (36.5 C), temperature source Oral, resp. rate 17, height 5' 6.5 (1.689 m), weight 56.4 kg, SpO2 100%.  Medical Problem List and Plan: 1. Functional deficits secondary to  bilateral embolic shower, embolic pattern, etiology:  Afib and missed Eliquis  doses vs from NSTEMI with cardiac cath.              -patient may  shower             -ELOS/Goals: 12-15 days min A-              Cannot sit upright without propping            -Continue CIR therapies including PT, OT, and SLP   - Ordered PRAFO/WHO right upper extremity, sling for right upper extremity also ordered  -Will plan for palliative consult for goals of care - called daughter- no answer, voicemail full  2.  Antithrombotics: -DVT/anticoagulation:  Pharmaceutical: Eliquis  Transitioned from IV Heparin  to Eliquis  10/14             -antiplatelet therapy: Asprin 81 mg  3. Pain Management: Gabapentin  and Tylenol  as needed  -Voltaren  R shoulder for R shoulder pain  - 10/17 patient now reporting some pain in her left shoulder, suspect from increased use with therapy.  Right shoulder doing better.  Will try Voltaren  gel to left shoulder, consider imaging if does not improve  10/18 added kpad for neck/shoulder as well  -10/20-21 pt reports shoulder pain is controlled currently, continue current 4. Mood/Behavior/Sleep: LCSW to follow for evaluation and support when available.              -antipsychotic agents: N/A 5. Neuropsych/cognition: This patient is not quite capable of making decisions on her own behalf. 6. Skin/Wound Care: routine pressure relief measures.  7. Fluids/Electrolytes/Nutrition: monitor intake and  output. Follow up labs in a.m.              - SLP consult: Carb modified diet + Ensure  -nutrition consult for decreased p.o. intake  10/18 intake remains minimal. RD following   -added megace for appetitte  10/19 obsv today. 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 Continue aspirin  and statin.   9. CAD: 10/4-10/7 admitted for NSTEMI  10. HLD: Lipitor 40 mg  11. HTN: monitor blood pressures on  Hyzaar, Cardizem , and Aldactone      11/16/2023    1:07 PM 11/16/2023    5:54 AM 11/15/2023    7:57 PM  Vitals with BMI  Systolic 117 133 878  Diastolic 47 63 60  Pulse 81 61 90   10/15-21  controlled on cardizem , monitor  12. Paroxymal Atrial Flutter/Fib: continue Eliquis  2.5 bid 13. T2DM: HgbA1c 6.0, resumed home Amaryl   - monitor cbg AC/HS with SSI.  CBG (last 3)  Recent Labs    11/15/23 2224 11/16/23 0550 11/16/23 1202  GLUCAP 107* 104* 99  - 10/15 Will order CBG/SSI  - CBGs well-controlled, holding amaryl . But not eating much  -10/20-21 controlled, continue current regimen, continued poor PO intake  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  Hypothyroidism: Synthroid  17.  Chronic anemia: Stable, monitor CBC.   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 Constipation- will order Milk of Mg per pt request and Miralax  daily  -LBM 10/16 continue to monitor  10/19 needs to eat!!--will give 30cc sorbitol today  10/21 LBM yesterday, continue to monitor  20.  Hyponatremia  - 10/16 sodium 130, sodium appears chronically low going back several years.  Continue to monitor  -10/20 NA 131 today, continue to monitor   -Recheck tomorrow 21. Azotemia  -10/20 IVF NS 50ml/hr  -Recheck tomorrow  LOS: 7 days A FACE TO FACE EVALUATION WAS  PERFORMED  Conerly Collier 11/16/2023, 2:51 PM

## 2023-11-16 NOTE — Progress Notes (Signed)
 Speech Language Pathology Daily Session Note  Patient Details  Name: [name] MRN: [id number] Date of Birth: 09/09/1940  Today's Date: 11/16/2023 SLP Individual Time: 8571-8541 SLP Individual Time Calculation (min): 30 min  Short Term Goals: Week 1: SLP Short Term Goal 1 (Week 1): Patient will consume least restrictive diet with use of compensatory strategies given supervision multimodal A SLP Short Term Goal 2 (Week 1): Patient will increase speech intelligiblity to 70% at the phrase level given mod multimodal A SLP Short Term Goal 3 (Week 1): Patient will demonstrate problem solving skills in mildly complex situations given mod multimodal A SLP Short Term Goal 4 (Week 1): Patient will recall and utilize memory compensatory strategies given mod multimodal A  Skilled Therapeutic Interventions: SLP conducted skilled therapy session targeting cognitive goals. SLP facilitated mildly complex sequencing and pattern matching task to target sustained attention, problem solving, and task organization. Patient benefited from mod assist for all above skills throughout duration of session. Patient was left in room with call bell in reach and alarm set. SLP will continue to target goals per plan of care.        Pain  None endorsed  Therapy/Group: Individual Therapy  Catheryne Deford, M.A., CCC-SLP  Ranay Ketter A Kelsea Mousel 11/16/2023, 3:01 PM

## 2023-11-16 NOTE — Progress Notes (Signed)
 Occupational Therapy Weekly Progress Note  Patient Details  Name: [name] MRN: [id number] Date of Birth: 1940/09/13  Beginning of progress report period: November 10, 2023 End of progress report period: November 16, 2023  Today's Date: 11/16/2023 OT Individual Time: 0902-1015 OT Individual Time Calculation (min): 73 min    Patient has met 2 of 4 short term goals.  Patient has make significant progress in functional abilities. Patient now demonstrates ability to sit upright on EOB unsupported for short bouts of time. Patient is continuing to progress with functional ADLs due to deficits in attention to limb and weakness in affected extremities. OT is facilitating increase in functional use through [facility] techniques, weight bearing, and NMR to increase functional abilities. Patient and OT have not yet scheduled family training due to expected progress before discharge. Continued skilled OT is recommended to increase strength, coordination, attention to affected side, ADL performance, and safety to decrease caregiver burden.   Patient continues to demonstrate the following deficits: muscle weakness, decreased cardiorespiratoy endurance, abnormal tone, unbalanced muscle activation, ataxia, decreased coordination, and decreased motor planning, decreased attention to right, right side neglect, and decreased motor planning, and decreased sitting balance, decreased standing balance, decreased postural control, hemiplegia, and decreased balance strategies and therefore will continue to benefit from skilled OT intervention to enhance overall performance with BADL and Reduce care partner burden.  Patient progressing toward long term goals..  Continue plan of care.  OT Short Term Goals Week 1:  OT Short Term Goal 1 (Week 1): patient will complete UB dressing with max A OT Short Term Goal 1 - Progress (Week 1): Met OT Short Term Goal 2 (Week 1): patient will complete grooming with mod A OT Short Term Goal  2 - Progress (Week 1): Met OT Short Term Goal 3 (Week 1): patient will complete functional toilet transfer to bsc max A OT Short Term Goal 3 - Progress (Week 1): Progressing toward goal OT Short Term Goal 4 (Week 1): patient will complete toileting on BSC or toilet with total A OT Short Term Goal 4 - Progress (Week 1): Progressing toward goal Week 2:  OT Short Term Goal 1 (Week 2): patient will demonstrate toilet transfers with maxA OT Short Term Goal 2 (Week 2): patient will complete LB donning with max A of 1 OT Short Term Goal 3 (Week 2): patient be able to complete static standing in prep for ADLS with max A  Skilled Therapeutic Interventions/Progress Updates:   Patient agreeable to participate in OT session. Reports no pain level.   Patient participated in skilled OT session focusing on self care, balance,and postural alignment. Patient received in bed in supine. Max A to EOB. Patient transferred to wc with max to total squat pivot transfer with poor standing core stbility and ability to look upright. Patient completed UB bathing with mod A for assist with affected side. Completed UB dressing mod to max A for threading UE. Patient then completed oral hygiene with mod A. Requested to use restroom with total A transfer to [facility] over toilet utilizing grab bar. Able to void urine in toilet. Total A peri hygiene and LB pants pull up. Patient required x2 assist back to wc.   Patient completed postural training on upright sitting to increase dynamic sitting balance and static standing balance with OT facilitation for chest up posture with proper hip and shoulder alignment. Patient them left in wc with alarm on awaiting PT session.  Therapy Documentation Precautions:  Precautions Precautions: Fall Recall  of Precautions/Restrictions: Intact Precaution/Restrictions Comments: SBP <180, R hemiparesis Required Braces or Orthoses: Other Brace Other Brace: R LE PRAFO, R UE sling, R UE resting hand  splint Restrictions Weight Bearing Restrictions Per Provider Order: No   Therapy/Group: Individual Therapy  D'mariea L Tamatha Gadbois 11/16/2023, 7:25 AM

## 2023-11-17 DIAGNOSIS — Z7189 Other specified counseling: Secondary | ICD-10-CM

## 2023-11-17 DIAGNOSIS — Z515 Encounter for palliative care: Secondary | ICD-10-CM

## 2023-11-17 DIAGNOSIS — R1310 Dysphagia, unspecified: Secondary | ICD-10-CM

## 2023-11-17 LAB — BASIC METABOLIC PANEL WITH GFR
Anion gap: 9 (ref 5–15)
BUN: 17 mg/dL (ref 8–23)
CO2: 20 mmol/L — ABNORMAL LOW (ref 22–32)
Calcium: 9.2 mg/dL (ref 8.9–10.3)
Chloride: 103 mmol/L (ref 98–111)
Creatinine, Ser: 0.92 mg/dL (ref 0.44–1.00)
GFR, Estimated: >60 mL/min (ref >60)
Glucose, Bld: 105 mg/dL — ABNORMAL HIGH (ref 70–99)
Potassium: 4.3 mmol/L (ref 3.5–5.1)
Sodium: 132 mmol/L — ABNORMAL LOW (ref 135–145)

## 2023-11-17 LAB — GLUCOSE, CAPILLARY
Glucose-Capillary: 90 mg/dL (ref 70–99)
Glucose-Capillary: 84 mg/dL (ref 70–99)
Glucose-Capillary: 125 mg/dL — ABNORMAL HIGH (ref 70–99)
Glucose-Capillary: 154 mg/dL — ABNORMAL HIGH (ref 70–99)

## 2023-11-17 MED ORDER — SODIUM CHLORIDE 0.9 % IV SOLN: Freq: Every day | INTRAVENOUS | Status: DC

## 2023-11-17 MED ORDER — ENSURE PLUS HIGH PROTEIN PO LIQD
237.0000 mL | Freq: Two times a day (BID) | ORAL | Status: DC
  Administered 2023-11-17 – 2023-12-09 (×24): 237 mL via ORAL

## 2023-11-17 NOTE — Progress Notes (Signed)
 Physical Therapy Session Note  Patient Details  Name: [name] MRN: [id number] Date of Birth: 05-Aug-1940  Today's Date: 11/17/2023 PT Individual Time: 1300-1412 PT Individual Time Calculation (min): 72 min   Short Term Goals: Week 2:  PT Short Term Goal 1 (Week 2): PT will trial lightweight manual WC and initiate WC evaluation PT Short Term Goal 2 (Week 2): Pt will perform bed to chair transfer with LRAD and mod A PT Short Term Goal 3 (Week 2): Pt will perform sit to stand with LRAD and mod A  Skilled Therapeutic Interventions/Progress Updates:      Pt seated in TIS upon arrival. Pt denies pain and agreeable to therapy. Pt reports fatigue. Rest breaks provided throughout session.  Session emphasized functional mobility/transfers, generalized strengthening and endurance, dynamic seated balance/coordination, and NMR.  Pt performed seated balance activity EOM with multidirectional reaching for cones using L UE. CGA/min A required to maintain upright midline posture.  Pt performed x10 seated modified crunches EOM, with min/mod A and significant verbal cueing for technique to improve postural control and core stability.  Pt performed mass practice sit <> stand transfers from mat table with max-total A (+2 with fatigue), use of mirror for visual feedback, and manual blocking of L knee to prevent buckling. Pt required verbal and tactile cues to achieve upright posture via neck/trunk/hip/knee extension. Pt performed 1x10 and 1x5 standing lateral weight shift using L UE support with max-total A +2. Verbal and tactile cues required for R LE activation and initiation of weight shifts.  Pt transferred back to TIS via [place] with total A due to fatigue.  Once back in room, pt performed 1x10 R knee extensions and 1x10 R ankle DF within available range from seated position in TIS. PT also performed PROM/stretching to R ankle for contracture prevention.  Pt remained in TIS, positioned for comfort,  seatbelt alarm on, and all needs within reach on L side upon PT exiting room. Pt expressed need for toileting - nursing notified.  Therapy Documentation Precautions:  Precautions Precautions: Fall Recall of Precautions/Restrictions: Intact Precaution/Restrictions Comments: SBP <180, R hemiparesis Required Braces or Orthoses: Other Brace Other Brace: R LE PRAFO, R UE sling, R UE resting hand splint Restrictions Weight Bearing Restrictions Per Provider Order: No  Therapy/Group: Individual Therapy  [name], DPT 11/17/2023, 12:58 PM

## 2023-11-17 NOTE — Progress Notes (Signed)
 PROGRESS NOTE    Subjective/Complaints: Pt had some nausea early this AM, improved when I spoke with her.  Occasional R shoulder pain with activity, Voltaren  helps.   ROS: Limited due to cognitive/behavioral. Denies CP/SOB  Objective:   No results found. Recent Labs    11/15/23 0653  WBC 5.1  HGB 10.5*  HCT 30.7*  PLT 173    Recent Labs    11/15/23 0653 11/17/23 0522  NA 131* 132*  K 4.2 4.3  CL 97* 103  CO2 23 20*  GLUCOSE 122* 105*  BUN 31* 17  CREATININE 1.04* 0.92  CALCIUM  9.8 9.2     Intake/Output Summary (Last 24 hours) at 11/17/2023 0937 Last data filed at 11/17/2023 0557 Gross per 24 hour  Intake 1430.81 ml  Output 200 ml  Net 1230.81 ml        Physical Exam: Vital Signs Blood pressure (!) 132/51, pulse 74, temperature 98.3 F (36.8 C), temperature source Oral, resp. rate 18, height 5' 6.5 (1.689 m), weight 56.4 kg, SpO2 100%.   Constitutional: No distress . Vital signs reviewed. Sitting in WC working with OT  HEENT: NCAT, EOMI, oral membranes dry Neck: supple Cardiovascular: RRR without murmur. No JVD    Respiratory/Chest: CTA Bilaterally without wheezes or rales. Normal effort    GI/Abdomen: BS +, non-tender, non-distended Ext: no clubbing, cyanosis, or edema Psych: flat but cooperative  Skin: Clean and intact without signs of breakdown Neuro: Awake and alert, delayed responses.  Minimal verbal output.  Decreased sensation light touch right lower extremity.  Decreased to light touch on RLE< but not RUE or face  RUE- 0/5  LUE 4+/5 throughout RLE- HF 1/5; KE/KF 1-2/5; DF 0/5; PF 2-/5 LLE- 4+/5 throughout  Musculoskeletal: Minimal right shoulder tenderness, minimal pain with L shoulder ROM      Assessment/Plan: 1. Functional deficits which require 3+ hours per day of interdisciplinary therapy in a comprehensive inpatient rehab setting. Physiatrist is providing close team  supervision and 24 hour management of active medical problems listed below. Physiatrist and rehab team continue to assess barriers to discharge/monitor patient progress toward functional and medical goals  Care Tool:  Bathing    Body parts bathed by patient: Right arm, Left arm, Chest, Abdomen, Front perineal area   Body parts bathed by helper: Abdomen, Chest, Front perineal area, Buttocks, Right upper leg, Left upper leg, Right lower leg, Left lower leg, Face     Bathing assist Assist Level: Total Assistance - Patient < 25%     Upper Body Dressing/Undressing Upper body dressing   What is the patient wearing?: Hospital gown only, Pull over shirt    Upper body assist Assist Level: Total Assistance - Patient < 25%    Lower Body Dressing/Undressing Lower body dressing      What is the patient wearing?: Underwear/pull up     Lower body assist Assist for lower body dressing: Total Assistance - Patient < 25%     Toileting Toileting    Toileting assist Assist for toileting: Total Assistance - Patient < 25%     Transfers Chair/bed transfer  Transfers assist  Chair/bed transfer activity did not occur: Safety/medical concerns (fatigue)  Chair/bed transfer assist level: Total Assistance - Patient < 25%     Locomotion Ambulation   Ambulation assist   Ambulation activity did not occur: Safety/medical concerns          Walk 10 feet activity   Assist  Walk 10 feet activity did not occur: Safety/medical concerns        Walk 50 feet activity   Assist Walk 50 feet with 2 turns activity did not occur: Safety/medical concerns         Walk 150 feet activity   Assist Walk 150 feet activity did not occur: Safety/medical concerns         Walk 10 feet on uneven surface  activity   Assist Walk 10 feet on uneven surfaces activity did not occur: Safety/medical concerns         Wheelchair     Assist Is the patient using a wheelchair?: Yes Type  of Wheelchair: Manual (TIS)    Wheelchair assist level: Dependent - Patient 0%      Wheelchair 50 feet with 2 turns activity    Assist        Assist Level: Dependent - Patient 0%   Wheelchair 150 feet activity     Assist      Assist Level: Dependent - Patient 0%   Blood pressure (!) 132/51, pulse 74, temperature 98.3 F (36.8 C), temperature source Oral, resp. rate 18, height 5' 6.5 (1.689 m), weight 56.4 kg, SpO2 100%.  Medical Problem List and Plan: 1. Functional deficits secondary to  bilateral embolic shower, embolic pattern, etiology:  Afib and missed Eliquis  doses vs from NSTEMI with cardiac cath.              -patient may  shower             -ELOS/Goals: 12-15 days min A-              Cannot sit upright without propping            -Continue CIR therapies including PT, OT, and SLP   - Ordered PRAFO/WHO right upper extremity, sling for right upper extremity also ordered  -Will plan for palliative consult for goals of care - called daughter- no answer, voicemail full x2, will place consult- discussed with patient  -Team conference today please see physician documentation under team conference tab, met with team  to discuss problems,progress, and goals. Formulized individual treatment plan based on medical history, underlying problem and comorbidities.    2.  Antithrombotics: -DVT/anticoagulation:  Pharmaceutical: Eliquis  Transitioned from IV Heparin  to Eliquis  10/14             -antiplatelet therapy: Asprin 81 mg  3. Pain Management: Gabapentin  and Tylenol  as needed  -Voltaren  R shoulder for R shoulder pain  - 10/17 patient now reporting some pain in her left shoulder, suspect from increased use with therapy.  Right shoulder doing better.  Will try Voltaren  gel to left shoulder, consider imaging if does not improve  10/18 added kpad for neck/shoulder as well  -10/22 Continue voltaren  gel for occasional shoulder pain 4. Mood/Behavior/Sleep: LCSW to follow for  evaluation and support when available.              -antipsychotic agents: N/A 5. Neuropsych/cognition: This patient is not quite capable of making decisions on her own behalf. 6. Skin/Wound Care: routine pressure relief measures.  7. Fluids/Electrolytes/Nutrition: monitor intake and output. Follow up labs in a.m.              -  SLP consult: Carb modified diet + Ensure  -nutrition consult for decreased p.o. intake  10/18 intake remains minimal. RD following   -added megace for appetitte  10/19 obsv today. Recheck labs, prealbumin in AM   10/20 Prealbumin 21 this AM, continue to monitor PO intake  10/22 Hx of pain with swallowing, appears has been chronic for several, nursing reports daughter said yesterday she was seen by GI in the past. Started PPI 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 Continue aspirin  and statin.   9. CAD: 10/4-10/7 admitted for NSTEMI  10. HLD: Lipitor 40 mg  11. HTN: monitor blood pressures on  Hyzaar, Cardizem , and Aldactone      11/17/2023    5:43 AM 11/16/2023    7:51 PM 11/16/2023    1:07 PM  Vitals with BMI  Systolic 132 137 882  Diastolic 51 53 47  Pulse 74 92 81   10/15-22  controlled on cardizem , monitor  12. Paroxymal Atrial Flutter/Fib: continue Eliquis  2.5 bid 13. T2DM: HgbA1c 6.0, resumed home Amaryl   - monitor cbg AC/HS with SSI.  CBG (last 3)  Recent Labs    11/16/23 1625 11/16/23 2100 11/17/23 0604  GLUCAP 115* 180* 90  - 10/15 Will order CBG/SSI  - CBGs well-controlled, holding amaryl . But not eating much  -10/20-22 controlled, continue current regimen, continued poor PO intake  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  Hypothyroidism: Synthroid  17.  Chronic anemia: Stable, monitor CBC.   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 Constipation- will order Milk of Mg per pt request and Miralax  daily  -LBM 10/16 continue  to monitor  10/19 needs to eat!!--will give 30cc sorbitol today  10/21 LBM yesterday, continue to monitor  20.  Hyponatremia  - 10/16 sodium 130, sodium appears chronically low going back several years.  Continue to monitor  -10/20 NA 131 today, continue to monitor   -10/22 NS improved to 132 21. Azotemia  -10/20 IVF NS 50ml/hr  -10/22 BUN/Cr improved , will change to nighttime IVF only LOS: 8 days A FACE TO FACE EVALUATION WAS PERFORMED  Condrey Collier 11/17/2023, 9:37 AM

## 2023-11-17 NOTE — Progress Notes (Signed)
 Occupational Therapy Session Note  Patient Details  Name: [name] MRN: [id number] Date of Birth: 1940-12-28  Today's Date: 11/17/2023 OT Individual Time: 1000-1115 OT Individual Time Calculation (min): 75 min    Short Term Goals: Week 1:  OT Short Term Goal 1 (Week 1): patient will complete UB dressing with max A OT Short Term Goal 1 - Progress (Week 1): Met OT Short Term Goal 2 (Week 1): patient will complete grooming with mod A OT Short Term Goal 2 - Progress (Week 1): Met OT Short Term Goal 3 (Week 1): patient will complete functional toilet transfer to bsc max A OT Short Term Goal 3 - Progress (Week 1): Progressing toward goal OT Short Term Goal 4 (Week 1): patient will complete toileting on BSC or toilet with total A OT Short Term Goal 4 - Progress (Week 1): Progressing toward goal Week 2:  OT Short Term Goal 1 (Week 2): patient will demonstrate toilet transfers with maxA OT Short Term Goal 2 (Week 2): patient will complete LB donning with max A of 1 OT Short Term Goal 3 (Week 2): patient be able to complete static standing in prep for ADLS with max A  Skilled Therapeutic Interventions/Progress Updates:   Patient agreeable to participate in OT session. Reports no pain level.   Patient participated in skilled OT session focusing on self care grooming, LB dressing seated balance , standing/ transfers, and proper body positioning. Patient received in room with increased fatigue today. Patient donned pants at bed level with max A to total with increased assist to roll to L. Patient transferred to wc via squat pivot with max to total A. Patient completed grooming and oral hygiene sitting at sink with SU assist. . Therapist facilitated proper upright sitting in wc at sink for toothbrushing. Patient then transitioned to gym to continue work on balance. Patient utilized mat table with no back support with visual cue via mirror for upright sitting. OT provided hands on facilitation at  hips due to posterior pelvic tilt. Once pelvis was stabilized OT provided visual and verbal cueing with tactile cues for standing and sitting upright. Patient able to complete 3x stands with max to total A due to fatigue. Patient returned to room and UE positioned.    Therapy Documentation Precautions:  Precautions Precautions: Fall Recall of Precautions/Restrictions: Intact Precaution/Restrictions Comments: SBP <180, R hemiparesis Required Braces or Orthoses: Other Brace Other Brace: R LE PRAFO, R UE sling, R UE resting hand splint Restrictions Weight Bearing Restrictions Per Provider Order: No Therapy/Group: Individual Therapy  D'mariea L Omarius Grantham 11/17/2023, 7:23 AM

## 2023-11-17 NOTE — Progress Notes (Signed)
 Patient ID: [name], female   DOB: [birth year], 83 y.o.   MRN: [id number]  Have reviewed team conference with pt and family. Both aware and agreeable with targeted d/c date of 11/05 and goals of Minimal Assistance - Patient > 75%.  Family education scheduled for 10/24 1p-4p and 10/30 1p-4p.   [name] plans on bringing her mothers dentures if she can find them.   HH will be set up for PT/OT/HHA. Family has no preference.  Wheelchair consult to be placed.  Family is interested in CAP-DA.

## 2023-11-17 NOTE — Plan of Care (Signed)
  Problem: Consults Goal: RH STROKE PATIENT EDUCATION Description: See Patient Education module for education specifics  Outcome: Progressing   Problem: RH SAFETY Goal: RH STG ADHERE TO SAFETY PRECAUTIONS W/ASSISTANCE/DEVICE Description: STG Adhere to Safety Precautions With cues Assistance/Device. Outcome: Progressing   Problem: RH KNOWLEDGE DEFICIT Goal: RH STG INCREASE KNOWLEDGE OF DIABETES Description: Patient and dtr will be able to manage DM using educational resources for medications and dietary modification independently Outcome: Progressing Goal: RH STG INCREASE KNOWLEDGE OF HYPERTENSION Description: Patient and dtr will be able to manage HTN using educational resources for medications and dietary modification independently Outcome: Progressing Goal: RH STG INCREASE KNOWLEGDE OF HYPERLIPIDEMIA Description: Patient and dtr will be able to manage HLD using educational resources for medications and dietary modification independently Outcome: Progressing Goal: RH STG 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 Description: Patient and dtr will be able to manage secondary risks using educational resources for medications and dietary modification independently Outcome: Progressing   Problem: Education: Goal: Ability to describe self-care measures that may prevent or decrease complications (Diabetes Survival Skills Education) will improve Outcome: Progressing Goal: Individualized Educational Video(s) Outcome: Progressing   Problem: Coping: Goal: Ability to adjust to condition or change in health will improve Outcome: Progressing   Problem: Fluid Volume: Goal: Ability to maintain a balanced intake and output will improve Outcome: Progressing   Problem: Health Behavior/Discharge Planning: Goal: Ability to identify and utilize available resources and services will improve Outcome: Progressing Goal: Ability to manage health-related needs will improve Outcome: Progressing    Problem: Metabolic: Goal: Ability to maintain appropriate glucose levels will improve Outcome: Progressing   Problem: Nutritional: Goal: Maintenance of adequate nutrition will improve Outcome: Progressing Goal: Progress toward achieving an optimal weight will improve Outcome: Progressing   Problem: Skin Integrity: Goal: Risk for impaired skin integrity will decrease Outcome: Progressing   Problem: Tissue Perfusion: Goal: Adequacy of tissue perfusion will improve Outcome: Progressing

## 2023-11-17 NOTE — Progress Notes (Signed)
 Occupational Therapist participated in the interdisciplinary team conference, providing clinical information regarding the patient's current status, treatment goals, and weekly focus, including any barriers that need to be addressed. Please see the Inpatient Rehabilitation Team Conference and Plan of Care Update for further details.   D'mariea Osceola Depaz OTR/L

## 2023-11-17 NOTE — Consult Note (Signed)
 Consultation Note Date: 11/17/2023   Patient Name: [name]  DOB: 07/02/1940  MRN: [id number]  Age / Sex: 83 y.o., female  PCP: [name], MD Referring Physician: Urbano Albright, MD  Reason for Consultation: Establishing goals of care  HPI/Patient Profile: 83 y.o. female  with past medical history of A.fib on Eliquis , recent dx of adenocarcinoma of lung undergoing radiation treatment, hx breast cancer s/p mastectomy, CAD, SLE with interstitial lung disease, hypertension, DM2, hypothyroidism, recent admission for NSTEMI with right heart cath (10/4-10/7)  admitted on 11/09/2023 with right sided weakness, slurred speech, altered mental status.   Initial CT imaging of the head indicated a large hypodensity on the right side, suggestive of a large territory infarct. 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 There were also bilateral embolic watershed infarcts and bilateral cerebellum infarcts identified.  Patient ultimately went to CIR for rehabilitation for her functional deficits.  PMT has been consulted to assist with goals of care conversation.  Clinical Assessment and Goals of Care:  I have reviewed medical records including EPIC notes, labs and imaging, assessed the patient and then met at the bedside to discuss diagnosis prognosis, GOC, EOL wishes, disposition and options.  I introduced Palliative Medicine as specialized medical care for people living with serious illness. It focuses on providing relief from the symptoms and stress of a serious illness. The goal is to improve quality of life for both the patient and the family.  We discussed a brief life review of the patient and then focused on their current illness.   I attempted to elicit  values and goals of care important to the patient.    Medical History Review and Understanding:  We discussed patient's acute illness in the context of their chronic comorbidities.  Patient seems to understand the severity of patient's illness.  Social History: Patient shares about her daughter who lives with her, her 2 precious grandchildren.  She enjoys cooking.  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   Functional and Nutritional State: Patient was previously very active before her stroke. Albumin of 3.1 on 10/15.  Palliative Symptoms: Constipation, shoulder pain, chronic swallowing difficulty/pain  Code Status: Concepts specific to code status, artifical feeding and hydration, and rehospitalization were considered and discussed.   Discussion: Created space and opportunity for patient's thoughts and feelings on patient's current illness. She became emotional when asked about her quality of life. She said things were going good and she greatly enjoys spending time with her two grandchildren. She has gotten a visit from the older boy, though her younger granddaughter is only 2.   Patient has not reconsidered her care preferences or goals of care since her recent acute illnesses.  She shares that she finished her radiation at the end of September and did have a follow-up scan planned.  She is not sure how she feels about intervention such as CPR, breathing tubes, feeding tubes, etc.  I offered a family meeting with her loved ones to come together and support her as she navigates this.  She is hesitant to schedule a family meeting, though agreeable to a call to her daughter to update her on her interaction today.  She will also read the provided resources and will  reflect on her own until our next visit.  I attempted to call her daughter, but was unable to reach.  Voicemail box was not set  up.   Discussed the importance of continued conversation with family and the medical providers regarding overall plan of care and treatment options, ensuring decisions are within the context of the patient's values and GOCs.   Questions and concerns were addressed.  Hard Choices booklet left for review. The family was encouraged to call with questions or concerns.  PMT will continue to support holistically.   SUMMARY OF RECOMMENDATIONS   -Continue full code/full scope treatment for now -Patient has not considered goals of care or care preferences, she will continue to reflect on this and defers a family meeting for now -Ongoing goals of care discussions -Psychosocial and emotional support provided -PMT will continue to follow and support   Prognosis:  Unable to determine  Discharge Planning: To Be Determined      Primary Diagnoses: Present on Admission:  Cerebellar stroke, acute [facility])   Physical Exam Vitals and nursing note reviewed.  Constitutional:      General: She is not in acute distress.    Appearance: She is ill-appearing.  HENT:     Head: Normocephalic and atraumatic.  Cardiovascular:     Rate and Rhythm: Normal rate.  Pulmonary:     Effort: Pulmonary effort is normal.  Neurological:     Mental Status: She is alert.  Psychiatric:        Mood and Affect: Affect is tearful.        Behavior: Behavior normal.    Vital Signs: BP (!) 131/54 (BP Location: Right Arm)   Pulse 81   Temp 98.6 F (37 C) (Oral)   Resp 18   Ht 5' 6.5 (1.689 m)   Wt 56.4 kg   SpO2 100%   BMI 19.77 kg/m  Pain Scale: 0-10 POSS *See Group Information*: 1-Acceptable,Awake and alert Pain Score: 0-No pain   SpO2: SpO2: 100 % O2 Device:SpO2: 100 % O2 Flow Rate: .    Alita Waldren P Latania Bascomb, PA-C  Palliative Medicine Team Team phone # [phone]  Thank you for allowing the Palliative Medicine Team to assist in the care of this patient. Please utilize secure chat with additional  questions, if there is no response within 30 minutes please call the above phone number.  Palliative Medicine Team providers are available by phone from 7am to 7pm daily and can be reached through the team cell phone.  Should this patient require assistance outside of these hours, please call the patient's attending physician.    Time Total: 60  Visit consisted of counseling and education dealing with the complex and emotionally intense issues of symptom management and palliative care in the setting of serious and potentially life-threatening illness. Greater than 50% of this time was spent counseling and coordinating care related to the above assessment and plan.  Personally spent 60 minutes in patient care including extensive chart review (labs, imaging, progress/consult notes, vital signs), medically appropraite exam, discussed with treatment team, education to patient, family, and staff, documenting clinical information, medication review and management, coordination of care, and available advanced directive documents.

## 2023-11-17 NOTE — Progress Notes (Signed)
 Speech Language Pathology Daily Session Note  Patient Details  Name: [name] MRN: [id number] Date of Birth: [birth year]  Today's Date: 11/17/2023 SLP Individual Time: 0830-0930 SLP Individual Time Calculation (min): 60 min  Short Term Goals: Week 1: SLP Short Term Goal 1 (Week 1): Patient will consume least restrictive diet with use of compensatory strategies given supervision multimodal A SLP Short Term Goal 2 (Week 1): Patient will increase speech intelligiblity to 70% at the phrase level given mod multimodal A SLP Short Term Goal 3 (Week 1): Patient will demonstrate problem solving skills in mildly complex situations given mod multimodal A SLP Short Term Goal 4 (Week 1): Patient will recall and utilize memory compensatory strategies given mod multimodal A  Skilled Therapeutic Interventions:   SLP conducted skilled therapy session targeting cognition goals. Upon SLP entry, patient regurgitating and voiced nausea. Pt modI for pushing call bell. Patient vomited puree and medications. Clinician notified nursing staff. Despite vomiting, patient agreeable to therapy session. SLP facilitated functional monetary task to target working memory, sustained attention, and processing time. Patient lethargic requiring min to modA for sustained attention. Patient min to modA for working memory and required increased processing time for providing dollar amounts. Processing time likely impacted by patients visual deficits. Patient was left in room with call bell in reach and alarm set. SLP will continue to target goals per plan of care.     Pain Pain Assessment Pain Scale: 0-10 Pain Score: 0-No pain  Therapy/Group: Individual Therapy  Ashley A Ellin 11/17/2023, 10:12 AM

## 2023-11-17 NOTE — Progress Notes (Signed)
 Speech Language Pathology Weekly Progress Note  Patient Details  Name: [name] MRN: [id number] Date of Birth: March 09, 1940  Beginning of progress report period: November 10, 2023 End of progress report period: November 17, 2023  Short Term Goals: Week 1: SLP Short Term Goal 1 (Week 1): Patient will consume least restrictive diet with use of compensatory strategies given supervision multimodal A SLP Short Term Goal 1 - Progress (Week 1): Met SLP Short Term Goal 2 (Week 1): Patient will increase speech intelligiblity to 70% at the phrase level given mod multimodal A SLP Short Term Goal 2 - Progress (Week 1): Met SLP Short Term Goal 3 (Week 1): Patient will demonstrate problem solving skills in mildly complex situations given mod multimodal A SLP Short Term Goal 3 - Progress (Week 1): Met SLP Short Term Goal 4 (Week 1): Patient will recall and utilize memory compensatory strategies given mod multimodal A SLP Short Term Goal 4 - Progress (Week 1): Met  New Short Term Goals: Week 2: SLP Short Term Goal 1 (Week 2): Patient will increase speech intelligiblity to 70% at the sentence level given mod multimodal A SLP Short Term Goal 2 (Week 2): Patient will demonstrate problem solving skills in mildly complex situations given min multimodal A SLP Short Term Goal 3 (Week 2): Patient will recall and utilize memory compensatory strategies given min multimodal A  Weekly Progress Updates: Patient has made slow but steady progress towards therapy goals, meeting 4/4 short term goals set for duration of the reporting period. She currently benefits from mod assist for mildly complex problem solving, recall of daily activities, use of speech intelligibility strategies at the phrase level to achieve 70% intelligibility, and she is supervision for use of swallow safety strategies, though benefits from encouragement to increase intake. Patient and family education ongoing. Patient will continue to benefit from  skilled therapy services during remainder of CIR stay.     Intensity: Minumum of 1-2 x/day, 30 to 90 minutes Frequency: 3 to 5 out of 7 days Duration/Length of Stay: 11/5 Treatment/Interventions: Cognitive remediation/compensation;Dysphagia/aspiration precaution training;Internal/external aids;Speech/Language facilitation;Therapeutic Activities;Functional tasks;Multimodal communication approach;Patient/family education  Rosina Downy, M.A., CCC-SLP  Blakelee Allington A Turhan Chill 11/17/2023, 10:18 AM

## 2023-11-18 LAB — BASIC METABOLIC PANEL WITH GFR
Anion gap: 7 (ref 5–15)
BUN: 14 mg/dL (ref 8–23)
CO2: 20 mmol/L — ABNORMAL LOW (ref 22–32)
Calcium: 8.8 mg/dL — ABNORMAL LOW (ref 8.9–10.3)
Chloride: 104 mmol/L (ref 98–111)
Creatinine, Ser: 0.93 mg/dL (ref 0.44–1.00)
GFR, Estimated: >60 mL/min (ref >60)
Glucose, Bld: 113 mg/dL — ABNORMAL HIGH (ref 70–99)
Potassium: 4.3 mmol/L (ref 3.5–5.1)
Sodium: 131 mmol/L — ABNORMAL LOW (ref 135–145)

## 2023-11-18 LAB — GLUCOSE, CAPILLARY
Glucose-Capillary: 107 mg/dL — ABNORMAL HIGH (ref 70–99)
Glucose-Capillary: 101 mg/dL — ABNORMAL HIGH (ref 70–99)
Glucose-Capillary: 72 mg/dL (ref 70–99)
Glucose-Capillary: 136 mg/dL — ABNORMAL HIGH (ref 70–99)

## 2023-11-18 LAB — CBC
HCT: 25.1 % — ABNORMAL LOW (ref 36.0–46.0)
Hemoglobin: 8.4 g/dL — ABNORMAL LOW (ref 12.0–15.0)
MCH: 28.9 pg (ref 26.0–34.0)
MCHC: 33.5 g/dL (ref 30.0–36.0)
MCV: 86.3 fL (ref 80.0–100.0)
Platelets: 135 10*3/uL — ABNORMAL LOW (ref 150–400)
RBC: 2.91 MIL/uL — ABNORMAL LOW (ref 3.87–5.11)
RDW: 16.8 % — ABNORMAL HIGH (ref 11.5–15.5)
WBC: 4.5 10*3/uL (ref 4.0–10.5)
nRBC: 0 % (ref 0.0–0.2)

## 2023-11-18 NOTE — Patient Care Conference (Signed)
 Inpatient RehabilitationTeam Conference and Plan of Care Update Date: 11/17/2023   Time: 9:36 AM    Patient Name: [name]      Medical Record Number: [id number]  Date of Birth: 1940/08/10 Sex: Female         Room/Bed: 4M09C/4M09C-01 Payor Info: Payor: [employer]vertising copywriter MEDICARE / Plan: DREMA DUAL COMPLETE / Product Type: *No Product type* /    Admit Date/Time:  11/09/2023  2:30 PM  Primary Diagnosis:  Acute right hemiparesis [facility])  Hospital Problems: Principal Problem:   Acute right hemiparesis [facility]) Active Problems:   Cerebellar stroke, acute [facility])    Expected Discharge Date: Expected Discharge Date: 12/01/23  Team Members Present: Physician leading conference: Dr. Jensen Collier Social Worker Present: Waverly Gentry, LCSW-A Nurse Present: Eulalio Falls, RN;Revin Corker Fredericka, RN PT Present: [name], PT;[name] Green, PT OT Present: Michaelyn Seip, OT SLP Present: Rosina Downy, SLP     Current Status/Progress Goal Weekly Team Focus  Bowel/Bladder   Patient is incontinent of bowel and bladder. Last BM 10/20 - required enema and disimpaction. Tolerated well.   Patient will regain continence of bowel and bladder.   Provide prompted toileting every 4 hours while awake.    Swallow/Nutrition/ Hydration   Regular/thin - slow mastication, limited intake   supervision  ongoing diet tolerance    ADL's   max to total A   min to mod   dynamic balance, postural control, weakness, R hemi use of UE, fatigue.    Mobility   Pt overall level of assist fluctuates with fatigue, attention, postural control. Pt is currently performing bed mobility with hospital bed features and max-total A, sit to stand with max-total A, squat pivot transfer with max-total A, seated balance with min-max A.   Min A from WC level  DC 11/5? FU HH PT. Pt overall progress toward goals limited by weakness, dense R hemiparesis, poor postural control, R UE/LE inattention, and significant  fatigue. Plan to schedule WC evaluation. Need extensive family training prior to DC. DME- TBD. Need to verify home set up, level of assistance available.    Communication   moderate dysarthria, further impacted by lack of dentition   minA   increased consistency with speech intelligibility strategies    Safety/Cognition/ Behavioral Observations  moderate cognitive deficits in problem solving, short term memory, and attention   minA   sustained attention, mildly complex problem solving, use of memory log and other external memory aids    Pain   Patient reports discomfort to her left neck/shoulder and headache. Voltaren  has been helping as well as PRN tylenol . Patient reporting burning in her throat on 10/21 - PRN maalox given for acid reflux. Patient was also started on protonix .   Patient will report a pain score less than 4/10.   Assess patient every shift and as needed for pain. Provide pain intervention when necessary.    Skin   No skin breakdown noted.   Skin will remain free of skin breakdown.  Assess skin every shift and as needed. Provide incontinence care in a timely manner to prevent MASD. Provide pressure reduction measures to prevent injuries.      Discharge Planning:  Plans to discharge home with daughter who will provide care. Family ed set up for 10/24 and 10/30. CAP-DA to begin. Wheelchair referral and [facility] PT/OT/SLP recommendations.   Team Discussion: Patient admitted post bilateral watershed CVA; right frontal, caudate, parietal, occipital,pons and PCA CVA. With dysarthria and right sided weakness, left neglect, poor appetite  and right shoulder pain.  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 Recent NSTEMI. Limited by fatigue and inattention,. Unable to follow hemi-techniques and progress impaired by balance and awareness deficits.    Patient on target to meet rehab goals: no, currently needs max- total assist for care.  Max - total assist for sit - stand and squat pivot transfers.  Moderate cognitive deficits, dysarthria and slow mastication affect po intake along with nausea and poor appetite on megace. Goals for discharge set for min assist overall.  *See Care Plan and progress notes for long and short-term goals.   Revisions to Treatment Plan:  Palliative Care Consult   Teaching Needs: Safety, medications, dietary modification, transfers, toileting, etc.   Current Barriers to Discharge: Decreased caregiver support, Home enviroment access/layout, and Nutritional means  Possible Resolutions to Barriers: Family education HH follow up services DME: W/C     Medical Summary Current Status: CVA, poor nutrition, shoulder pain, T2DM, Azotemia, hyponatremia, T2DM, consitpation, HTN, Poor appetite, hx Lung adenocarcinoma  Barriers to Discharge: Morbid Obesity;Renal Insufficiency/Failure;Electrolyte abnormality;Medical stability  Barriers to Discharge Comments: CVA, poor nutrition, shoulder pain, T2DM, Azotemia, hyponatremia, T2DM, consitpation, HTN, hx  Lung adenocarcinoma Possible Resolutions to [name] Focus: IVF, paliative for goals, monitor CBG,monitor bowel function, voltaren  for shoulder pain   Continued Need for Acute Rehabilitation Level of Care: The patient requires daily medical management by a physician with specialized training in physical medicine and rehabilitation for the following reasons: Direction of a multidisciplinary physical rehabilitation program to maximize functional independence : Yes Medical management of patient stability for increased activity during participation in an intensive rehabilitation regime.: Yes Analysis of laboratory values and/or radiology reports with any subsequent need for medication adjustment and/or medical intervention. : Yes   I attest that I was present, lead the team conference, and concur with the assessment and plan of the team.   [name] 11/18/2023, 8:31 PM

## 2023-11-18 NOTE — Progress Notes (Signed)
 Speech Language Pathology Daily Session Note  Patient Details  Name: [name] MRN: [id number] Date of Birth: 1941/01/14  Today's Date: 11/18/2023 SLP Individual Time: 0730-0830 SLP Individual Time Calculation (min): 60 min  Short Term Goals: Week 2: SLP Short Term Goal 1 (Week 2): Patient will increase speech intelligiblity to 70% at the sentence level given mod multimodal A SLP Short Term Goal 2 (Week 2): Patient will demonstrate problem solving skills in mildly complex situations given min multimodal A SLP Short Term Goal 3 (Week 2): Patient will recall and utilize memory compensatory strategies given min multimodal A  Skilled Therapeutic Interventions: SLP conducted skilled therapy session targeting swallowing and cognitive goals. Patient fatigued from the night and benefited from mod multimodal cues throughout session to maintain alertness. In contrast from prior sessions, patient's dentures are present at bedside, however patient endorses she does not want to wear them and that she hasn't worn them in a long time. Patient consumed regular/thin liquids with no overt s/sx of penetration/aspiration but required max extra time for engagement with meal items and intake was overall limited - suspect d/t appetite rather than swallowing function. After completion of meal, SLP facilitated sequencing and organization task targeting sustained attention and problem solving skills. Patient benefited from min to mod cues to sustain attention to task and to sort accurately based on suits/numbers. Patient was left in room with call bell in reach and alarm set. SLP will continue to target goals per plan of care.       Pain  None   Therapy/Group: Individual Therapy  Wenceslao Loper, M.A., CCC-SLP  Giang Hemme A Rowdy Guerrini 11/18/2023, 8:39 AM

## 2023-11-18 NOTE — Progress Notes (Signed)
 Daily Progress Note   Patient Name: [name]       Date: 11/18/2023 DOB: August 08, 1940  Age: 83 y.o. MRN#: [id number] Attending Physician: Urbano Albright, MD Primary Care Physician: [name], MD Admit Date: 11/09/2023  Reason for Consultation/Follow-up: Establishing goals of care  Subjective: Medical records reviewed including progress notes, labs and imaging. Patient assessed at the bedside. She denies pain or distress. No visitors present during my visit.  Created space and opportunity for patient's thoughts and feelings on her current illness. She has not considered yesterday's conversation much more and has not had the opportunity to reach hard choices booklet. I provided her with update on my attempt to call her daughter yesterday. I encouraged her to consider a family meeting to discuss her goals and care preferences and she is more amendable today. She understands that she is not being pushed to make any changes and the medical team will support her wishes regardless of her choices. Emphasized importance of ongoing goals of care discussions given the impact of recent events and changes to her quality of life.  Called patient's daughter [name] and provided update on my conversation with patient. She notes that her uncle and mother-in-law lived in palliative care facilities before being discharged when they were no longer approaching end of life. We discussed difference between palliative care and hospice. She shares that she has also seen patient's tearfulness and heightened emotional state since her stroke (she is not normally this emotional). Clarified that patient has a son and a daughter, her son is currently in [place] to see her from out of town. Patient also has grandchildren, greatgrandchildren, and great-great  granchildren who are likely who she has been referring to.   Discussed limited resources available in the outpatient setting with palliative care, though hospice would provide more. [name] feels her mother would not want hospice quite yet. Acknowledges the challenges of patients in the middle ground who need more support at home but aren't at the hospice level yet. Offered to arrange a family meeting and she would be open to this when I am back on service next week. PMT contact information was provided should urgent needs arise in the meantime.  Questions and concerns addressed. PMT will continue to support holistically.   Length of Stay: 9   Physical Exam Vitals and nursing note reviewed.  Constitutional:      General: She is not in acute distress.    Appearance: She is ill-appearing.  HENT:     Head: Normocephalic and atraumatic.  Cardiovascular:     Rate and Rhythm: Normal rate.  Pulmonary:  Effort: Pulmonary effort is normal.  Neurological:     Mental Status: She is alert.  Psychiatric:        Mood and Affect: Mood normal.        Behavior: Behavior normal.            Vital Signs: BP 123/76   Pulse 86   Temp 98.2 F (36.8 C)   Resp 17   Ht 5' 6.5 (1.689 m)   Wt 56.4 kg   SpO2 98%   BMI 19.77 kg/m  SpO2: SpO2: 98 % O2 Device: O2 Device: Room Air O2 Flow Rate:     Palliative Care Assessment & Plan   Patient Profile: 83 y.o. female  with past medical history of A.fib on Eliquis , recent dx of adenocarcinoma of lung undergoing radiation treatment, hx breast cancer s/p mastectomy, CAD, SLE with interstitial lung disease, hypertension, DM2, hypothyroidism, recent admission for NSTEMI with right heart cath (10/4-10/7)  admitted on 11/09/2023 with right sided weakness, slurred speech, altered mental status.    Initial CT imaging of the head indicated a large hypodensity on the right side, suggestive of a large territory infarct. An MRI of the brain revealed multiple acute  infarcts in a bilateral watershed distribution, affecting the convexities, inferior right frontal lobe, right caudate head, posterior parietal occipital lobes, right lateral pons, and posterior cerebellum. There were also bilateral embolic watershed infarcts and bilateral cerebellum infarcts identified.  Patient ultimately went to [facility] for rehabilitation for her functional deficits.   PMT has been consulted to assist with goals of care conversation.  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 Plan to meet again 10/27. If urgent needs arise, please do not hesistate to call team line   Prognosis:  Unable to determine  Discharge Planning: To Be Determined  Care plan was discussed with Patient, patient's daughter   Mickle SHAUNNA Fell, PA-C  Palliative Medicine Team Team phone # [phone]  Thank you for allowing the Palliative Medicine Team to assist in the care of this patient. Please utilize secure chat with additional questions, if there is no response within 30 minutes please call the above phone number.  Palliative Medicine Team providers are available by phone from 7am to 7pm daily and can be reached through the team cell phone.  Should this patient require assistance outside of these hours, please call the patient's attending physician.     Time Total: 35  Visit consisted of counseling and education dealing with the complex  and emotionally intense issues of symptom management and palliative care in the setting of serious and potentially life-threatening illness. Greater than 50% of this time was spent counseling and coordinating care related to the above assessment and plan.  Personally spent 35 minutes in patient care including extensive chart review (labs, imaging, progress/consult notes, vital signs), medically appropraite exam, discussed with treatment team, education to patient, family, and staff, documenting clinical information, medication review and management, coordination of care, and available advanced directive documents.

## 2023-11-18 NOTE — Progress Notes (Addendum)
 Nutrition Follow-Up  DOCUMENTATION CODES:   Severe malnutrition in context of chronic illness  INTERVENTION:  Encourage Ensure+HP BID, more if patient amenable. Each supplement provides 350 kcal and 20 grams of protein. Provide feeding assistance with encouragement. Ensure patient wears dentures during meals. Continue daily multivitamin. Agree with Megace.  NUTRITION DIAGNOSIS:   Severe Malnutrition related to chronic illness as evidenced by severe fat depletion, severe muscle depletion, percent weight loss, energy intake < or equal to 75% for > or equal to 1 month.  GOAL:   Patient will meet greater than or equal to 90% of their needs, Weight gain   MONITOR:   PO intake, Supplement acceptance, Weight trends  REASON FOR ASSESSMENT:   Consult Poor PO  ASSESSMENT:   PMH significant for DM2, recent dx lung CA undergoing radiation, breast CA s/p mastectomy, recent NSTEMI admission 10/4-10/7, Afib, CAD, HTN, interstitial lung disease, hypothyroidism. 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  Visited the patient who states that she has had trouble swallowing since radiation in September. She weighed 140 lbs then. She dislikes the food here but has been making herself eat at least 50% of her meals. She had most of her eggs, bacon and toast this morning. She is not drinking her ONS because she tries to save them for dinner time and then forgets about them. RN was in room and offered to bring the patient a vanilla Ensure+HP. Encouraged the patient to focus on ONS and to consume at least two a day if not more. When asked about her typical intake prior to admission, she tells me she skips breakfast and lunch and has done so for years. She is typically on the go for dinner and would grab a fast-food meal like sesame chicken for dinner. She became tearful when I asked more about her intake. She tells me she is fully able  to ambulate at home without assistance of a walker. She is edentulous and requires dentures but has not been wearing them. Encouraged the patient to wear them during meal intake. Her family has been visiting her here and bringing her food. She is happy she got to see her 57-week old grand daughter.   Procedures: 11/01/23 - L heart cath, angiography   Meal Intake: Average of <50%  Scheduled Meds:  anastrozole   1 mg Oral Daily   apixaban   2.5 mg Oral BID   aspirin  EC  81 mg Oral Daily   atorvastatin   40 mg Oral Daily   diclofenac  Sodium  2 g Topical QID   diltiazem   120 mg Oral Daily   feeding supplement  237 mL Oral BID BM   gabapentin   300 mg Oral TID   hydroxychloroquine   200 mg Oral Daily   insulin  aspart  0-6 Units Subcutaneous TID WC   levothyroxine   100 mcg Oral Daily   megestrol  400 mg Oral BID   multivitamin with minerals  1 tablet Oral Daily   pantoprazole   40 mg Oral Daily   polyethylene glycol  17 g Oral Daily   Continuous Infusions:  sodium chloride  Stopped (11/18/23 1028)   Pertinent Labs: Persistent hyponatremia Na 131  I/O: +4 L since admit    NUTRITION - FOCUSED PHYSICAL EXAM:  Flowsheet Row Most Recent Value  Orbital Region No depletion  Upper Arm Region Severe depletion  Thoracic and Lumbar Region Severe depletion  Buccal Region Mild depletion  Temple Region Moderate depletion  Clavicle Bone Region  Moderate depletion  Clavicle and Acromion Bone Region Severe depletion  Scapular Bone Region Severe depletion  Dorsal Hand Severe depletion  Patellar Region Moderate depletion  Anterior Thigh Region Severe depletion  Posterior Calf Region Moderate depletion  Hair Reviewed  Eyes Reviewed  Mouth Other (Comment)  [edentulous, requires dentures]  Skin Reviewed  Nails Reviewed    Diet Order:   Diet Order             Diet regular Room service appropriate? Yes; Fluid consistency: Thin  Diet effective now                 Ensure+HP BID  EDUCATION  NEEDS:   Education needs have been addressed  Skin:  Skin Assessment: Reviewed RN Assessment  Last BM:  10/23 type 6 mushy  Height:   Ht Readings from Last 1 Encounters:  11/09/23 5' 6.5 (1.689 m)    Weight:    8 Kg (12%) loss in 3 months per records; 16 lbs (11%) loss in 1 month per patient recount  Ideal Body Weight:  61.4 kg  BMI:  Body mass index is 19.77 kg/m.  Estimated Nutritional Needs:   Kcal:  1700-1900 kcal/d  Protein:  80-100g/d  Fluid:  >/=1.7L/d    Leverne Ruth, MS, RDN, LDN [place]. [facility] See AMION for contact information

## 2023-11-18 NOTE — Progress Notes (Signed)
 PROGRESS NOTE    Subjective/Complaints:  No new complaints this AM. Pain overall under control.   ROS: Limited due to cognitive/behavioral. Denies CP/SOB Pain with swallowing - chronic   Objective:   No results found. Recent Labs    11/18/23 0503  WBC 4.5  HGB 8.4*  HCT 25.1*  PLT 135*    Recent Labs    11/17/23 0522 11/18/23 0503  NA 132* 131*  K 4.3 4.3  CL 103 104  CO2 20* 20*  GLUCOSE 105* 113*  BUN 17 14  CREATININE 0.92 0.93  CALCIUM  9.2 8.8*     Intake/Output Summary (Last 24 hours) at 11/18/2023 1047 Last data filed at 11/17/2023 2100 Gross per 24 hour  Intake 480 ml  Output --  Net 480 ml        Physical Exam: Vital Signs Blood pressure 123/76, pulse 86, temperature 98.2 F (36.8 C), resp. rate 17, height 5' 6.5 (1.689 m), weight 56.4 kg, SpO2 98%.   Constitutional: No distress . Vital signs reviewed. Laying in bed, appears comfortable  HEENT: NCAT, EOMI, oral membranes moist Neck: supple Cardiovascular: RRR without murmur. No JVD    Respiratory/Chest: CTA Bilaterally without wheezes or rales. Normal effort    GI/Abdomen: BS +, non-tender, non-distended Ext: no clubbing, cyanosis, or edema Psych: flat but cooperative  Skin: Clean and intact without signs of breakdown Neuro: Awake and alert, delayed responses.  Minimal verbal output.  Decreased sensation light touch right lower extremity.  Decreased to light touch on RLE< but not RUE or face  RUE- 0/5  LUE 4+/5 throughout RLE- HF 1/5; KE/KF 1-2/5; DF 0/5; PF 2-/5 LLE- 4+/5 throughout  Prior neuro assessment is c/w today's exam 11/18/2023.   Musculoskeletal: Minimal right shoulder tenderness, minimal pain with L shoulder ROM      Assessment/Plan: 1. Functional deficits which require 3+ hours per day of interdisciplinary therapy in a comprehensive inpatient rehab setting. Physiatrist is providing close team supervision and 24  hour management of active medical problems listed below. Physiatrist and rehab team continue to assess barriers to discharge/monitor patient progress toward functional and medical goals  Care Tool:  Bathing    Body parts bathed by patient: Right arm, Left arm, Chest, Abdomen, Front perineal area   Body parts bathed by helper: Abdomen, Chest, Front perineal area, Buttocks, Right upper leg, Left upper leg, Right lower leg, Left lower leg, Face     Bathing assist Assist Level: Total Assistance - Patient < 25%     Upper Body Dressing/Undressing Upper body dressing   What is the patient wearing?: Hospital gown only, Pull over shirt    Upper body assist Assist Level: Total Assistance - Patient < 25%    Lower Body Dressing/Undressing Lower body dressing      What is the patient wearing?: Underwear/pull up     Lower body assist Assist for lower body dressing: Total Assistance - Patient < 25%     Toileting Toileting    Toileting assist Assist for toileting: Total Assistance - Patient < 25%     Transfers Chair/bed transfer  Transfers assist  Chair/bed transfer activity did not occur: Safety/medical concerns (fatigue)  Chair/bed  transfer assist level: Total Assistance - Patient < 25%     Locomotion Ambulation   Ambulation assist   Ambulation activity did not occur: Safety/medical concerns          Walk 10 feet activity   Assist  Walk 10 feet activity did not occur: Safety/medical concerns        Walk 50 feet activity   Assist Walk 50 feet with 2 turns activity did not occur: Safety/medical concerns         Walk 150 feet activity   Assist Walk 150 feet activity did not occur: Safety/medical concerns         Walk 10 feet on uneven surface  activity   Assist Walk 10 feet on uneven surfaces activity did not occur: Safety/medical concerns         Wheelchair     Assist Is the patient using a wheelchair?: Yes Type of Wheelchair:  Manual (TIS)    Wheelchair assist level: Dependent - Patient 0%      Wheelchair 50 feet with 2 turns activity    Assist        Assist Level: Dependent - Patient 0%   Wheelchair 150 feet activity     Assist      Assist Level: Dependent - Patient 0%   Blood pressure 123/76, pulse 86, temperature 98.2 F (36.8 C), resp. rate 17, height 5' 6.5 (1.689 m), weight 56.4 kg, SpO2 98%.  Medical Problem List and Plan: 1. Functional deficits secondary to  bilateral embolic shower, embolic pattern, etiology:  Afib and missed Eliquis  doses vs from NSTEMI with cardiac cath.              -patient may  shower             -ELOS/Goals: 12-15 days min A-              Cannot sit upright without propping            -Continue CIR therapies including PT, OT, and SLP   - Ordered PRAFO/WHO right upper extremity, sling for right upper extremity also ordered  -Will plan for palliative consult for goals of care - notified daughter and pt, pt seen yesterday, appreciate assistance 2.  Antithrombotics: -DVT/anticoagulation:  Pharmaceutical: Eliquis  Transitioned from IV Heparin  to Eliquis  10/14             -antiplatelet therapy: Asprin 81 mg  3. Pain Management: Gabapentin  and Tylenol  as needed  -Voltaren  R shoulder for R shoulder pain  - 10/17 patient now reporting some pain in her left shoulder, suspect from increased use with therapy.  Right shoulder doing better.  Will try Voltaren  gel to left shoulder, consider imaging if does not improve  10/18 added kpad for neck/shoulder as well  -10/22-23 Continue voltaren  gel for occasional shoulder pain 4. Mood/Behavior/Sleep: LCSW to follow for evaluation and support when available.              -antipsychotic agents: N/A 5. Neuropsych/cognition: This patient is not quite capable of making decisions on her own behalf. 6. Skin/Wound Care: routine pressure relief measures.  7. Fluids/Electrolytes/Nutrition: monitor intake and output. Follow up labs in  a.m.              - SLP consult: Carb modified diet + Ensure  -nutrition consult for decreased p.o. intake  10/18 intake remains minimal. RD following   -added megace for appetitte  10/19 obsv today. Recheck labs, prealbumin in AM  10/20 Prealbumin 21 this AM, continue to monitor PO intake  10/22 Hx of pain with swallowing, appears has been chronic for several, nursing reports daughter said yesterday she was seen by GI in the past. Started PPI  10/23 called GI for consult- she has been seen by [facility] outpatient 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 Continue aspirin  and statin.   9. CAD: 10/4-10/7 admitted for NSTEMI  10. HLD: Lipitor 40 mg  11. HTN: monitor blood pressures on  Hyzaar, Cardizem , and Aldactone      11/18/2023    9:36 AM 11/18/2023    4:59 AM 11/17/2023    7:30 PM  Vitals with BMI  Systolic 123 181 864  Diastolic 76 54 53  Pulse  86 99   10/13 had elevated BP this AM, continue to monitor for now  12. Paroxymal Atrial Flutter/Fib: continue Eliquis  2.5 bid 13. T2DM: HgbA1c 6.0, resumed home Amaryl   - monitor cbg AC/HS with SSI.  CBG (last 3)  Recent Labs    11/17/23 1651 11/17/23 2100 11/18/23 0625  GLUCAP 125* 154* 107*  - 10/15 Will order CBG/SSI  - CBGs well-controlled, holding amaryl . But not eating much  -10/20-23 controlled, continue current regimen, continued poor PO intake  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  Hypothyroidism: Synthroid  17.  Chronic anemia: Stable, monitor CBC.   -10/17 Hgb improved to 10 yesterday  -10/18 lower HGB at 8.4 suspect dilutional due to IVF, recheck tomorrow 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 Constipation- will order Milk of Mg per pt request and Miralax  daily  -LBM 10/16 continue to monitor  10/19 needs to eat!!--will give 30cc sorbitol today  10/23 LBM  today 20.  Hyponatremia  - 10/16 sodium 130, sodium appears chronically  low going back several years.  Continue to monitor  -10/20 NA 131 today, continue to monitor   -10/23 NA stable 131 21. Azotemia  -10/20 IVF NS 50ml/hr  -10/23 BUN and CR improved, continue IVF at night for poor oral intake, can hold if drinks 1L  LOS: 9 days A FACE TO FACE EVALUATION WAS PERFORMED  Eichhorn Collier 11/18/2023, 10:47 AM

## 2023-11-18 NOTE — Progress Notes (Signed)
 Physical Therapy Session Note  Patient Details  Name: [name] MRN: [id number] Date of Birth: [birth year]  Today's Date: 11/18/2023 PT Individual Time: 0917-1030 PT Individual Time Calculation (min): 73 min   Short Term Goals: Week 2:  PT Short Term Goal 1 (Week 2): PT will trial lightweight manual WC and initiate WC evaluation PT Short Term Goal 2 (Week 2): Pt will perform bed to chair transfer with LRAD and mod A PT Short Term Goal 3 (Week 2): Pt will perform sit to stand with LRAD and mod A  Skilled Therapeutic Interventions/Progress Updates:      Pt supine in bed upon arrival. Pt denies pain, endorses fatigue but agreeable to therapy. Nursing administered medication during session.  Session emphasized functional strengthening and trunk control with bed mobility, transfers, and sitting balance.  Pt incontinent of bowel and bladder in brief. Pt performed R rolling with use of hospital bed features with max A and L rolling with total A in order for PT to change brief. Max-total A required for donning pants.  Pt sat to EOB with max A and verbal/tactile cueing for use of L UE to push trunk into upright seated position.   Pt performed multiple squat pivot transfers throughout session with max-total A and verbal cueing for head hip ratio and proper L hand/foot placement.  Pt sat EOM with use of L UE to maintain seated balance with close supervision, however, R trunk lean noted with fatigue requiring min A and verbal cueing for pt to correct. Pt required significant verbal cueing and encouragement to achieve and maintain upright seated posture. Patient with great difficulty maintaining cervical and trunk extension. Pt very fatigued throughout session.  Pt requested to use bathroom. Stand/squat pivot transfer TIS <> BSC over toilet with total A and +2 for toilet hygiene and clothing management.  Pt remained in TIS and all needs within reach on L side upon PT exiting room.  Therapy  Documentation Precautions:  Precautions Precautions: Fall Recall of Precautions/Restrictions: Intact Precaution/Restrictions Comments: SBP <180, R hemiparesis Required Braces or Orthoses: Other Brace Other Brace: R LE PRAFO, R UE sling, R UE resting hand splint Restrictions Weight Bearing Restrictions Per Provider Order: No  Therapy/Group: Individual Therapy  [name], DPT 11/18/2023, 7:45 AM

## 2023-11-18 NOTE — Progress Notes (Signed)
 Occupational Therapy Session Note  Patient Details  Name: [name] MRN: [id number] Date of Birth: [birth year]  Today's Date: 11/18/2023 OT Individual Time: 8582-8482 OT Individual Time Calculation (min): 60 min    Short Term Goals: Week 2:  OT Short Term Goal 1 (Week 2): patient will demonstrate toilet transfers with maxA OT Short Term Goal 2 (Week 2): patient will complete LB donning with max A of 1 OT Short Term Goal 3 (Week 2): patient be able to complete static standing in prep for ADLS with max A  Skilled Therapeutic Interventions/Progress Updates:   Patient received seated in tilt in space chair with head hanging forward.  Patient indicates not feeling well - vitals stable - O2 100%, BP 123/53.   Patient transported to gym to address postural control.   Worked on more upright sitting in midline while in chair and assisted patient to scoot so feet were in contact with floor.  Transferred patient squat pivot and max assist to mat table.  Worked on static sitting in midline with emphasis on trunk extension.  Supported BUE  on table in front of patient while providing upright support from behind patient to first even achieve trunk extension and upright head.  Patient initially very stiff, but with gentle movement of ant/posterior weight shift started to activate extension.  Patient had difficulty sustaining trunk extension.   Worked on more dynamic sitting balance weight shifting slightly laterally off base of support.  Patient alert during session, at times giggling, but needing frequent cues and assist to maintain head up over extended trunk.  Worked on partial sit to stand with mass over base in midline.  Transferred back to chair and back to room to change soiled brief.  Used components of bridging and rolling to aide with self care.  Patient with blood in brief/ in stool.  Alerted nursing.  Patient left in bed at end of session with bed alarm engaged and call bell, suction and personal  items in reach.    Therapy Documentation Precautions:  Precautions Precautions: Fall Recall of Precautions/Restrictions: Intact Precaution/Restrictions Comments: SBP <180, R hemiparesis Required Braces or Orthoses: Other Brace Other Brace: R LE PRAFO, R UE sling, R UE resting hand splint Restrictions Weight Bearing Restrictions Per Provider Order: No   Pain:  Denies pain     Therapy/Group: Individual Therapy  [name] M 11/18/2023, 3:23 PM

## 2023-11-19 ENCOUNTER — Inpatient Hospital Stay (HOSPITAL_COMMUNITY)

## 2023-11-19 DIAGNOSIS — E43 Unspecified severe protein-calorie malnutrition: Secondary | ICD-10-CM | POA: Insufficient documentation

## 2023-11-19 LAB — BASIC METABOLIC PANEL WITH GFR
Anion gap: 12 (ref 5–15)
BUN: 15 mg/dL (ref 8–23)
CO2: 18 mmol/L — ABNORMAL LOW (ref 22–32)
Calcium: 9.1 mg/dL (ref 8.9–10.3)
Chloride: 98 mmol/L (ref 98–111)
Creatinine, Ser: 1 mg/dL (ref 0.44–1.00)
GFR, Estimated: 57 mL/min — ABNORMAL LOW (ref >60)
Glucose, Bld: 167 mg/dL — ABNORMAL HIGH (ref 70–99)
Potassium: 3.9 mmol/L (ref 3.5–5.1)
Sodium: 128 mmol/L — ABNORMAL LOW (ref 135–145)

## 2023-11-19 LAB — GLUCOSE, CAPILLARY
Glucose-Capillary: 93 mg/dL (ref 70–99)
Glucose-Capillary: 102 mg/dL — ABNORMAL HIGH (ref 70–99)
Glucose-Capillary: 122 mg/dL — ABNORMAL HIGH (ref 70–99)
Glucose-Capillary: 99 mg/dL (ref 70–99)
Glucose-Capillary: 154 mg/dL — ABNORMAL HIGH (ref 70–99)

## 2023-11-19 LAB — CBC
HCT: 25.6 % — ABNORMAL LOW (ref 36.0–46.0)
HCT: 32 % — ABNORMAL LOW (ref 36.0–46.0)
Hemoglobin: 8.6 g/dL — ABNORMAL LOW (ref 12.0–15.0)
Hemoglobin: 10.1 g/dL — ABNORMAL LOW (ref 12.0–15.0)
MCH: 28.5 pg (ref 26.0–34.0)
MCH: 28.6 pg (ref 26.0–34.0)
MCHC: 33.6 g/dL (ref 30.0–36.0)
MCHC: 31.6 g/dL (ref 30.0–36.0)
MCV: 84.8 fL (ref 80.0–100.0)
MCV: 90.7 fL (ref 80.0–100.0)
Platelets: 125 K/uL — ABNORMAL LOW (ref 150–400)
Platelets: 125 K/uL — ABNORMAL LOW (ref 150–400)
RBC: 3.02 MIL/uL — ABNORMAL LOW (ref 3.87–5.11)
RBC: 3.53 MIL/uL — ABNORMAL LOW (ref 3.87–5.11)
RDW: 16.8 % — ABNORMAL HIGH (ref 11.5–15.5)
RDW: 17.2 % — ABNORMAL HIGH (ref 11.5–15.5)
WBC: 4.7 K/uL (ref 4.0–10.5)
WBC: 5.1 K/uL (ref 4.0–10.5)
nRBC: 0 % (ref 0.0–0.2)
nRBC: 0 % (ref 0.0–0.2)

## 2023-11-19 MED ORDER — SUCRALFATE 1 GM/10ML PO SUSP
1.0000 g | Freq: Three times a day (TID) | ORAL | Status: DC
  Administered 2023-11-19 – 2023-12-09 (×61): 1 g via ORAL
  Filled 2023-11-19 (×74): qty 10

## 2023-11-19 NOTE — Progress Notes (Signed)
 Speech Language Pathology Daily Session Note  Patient Details  Name: [name] MRN: [id number] Date of Birth: 05/21/1940  Today's Date: 11/19/2023 SLP Individual Time: 1500-1525 SLP Individual Time Calculation (min): 25 min  Short Term Goals: Week 2: SLP Short Term Goal 1 (Week 2): Patient will increase speech intelligiblity to 70% at the sentence level given mod multimodal A SLP Short Term Goal 2 (Week 2): Patient will demonstrate problem solving skills in mildly complex situations given min multimodal A SLP Short Term Goal 3 (Week 2): Patient will recall and utilize memory compensatory strategies given min multimodal A  Skilled Therapeutic Interventions:   Pt seen for SLP Family Education session to review cognitive and swallowing goals with pt and her daughter. Discussed cognitive strategies to implement at home, simple tasks that pt can work on with the support of her daughter, and plan for [facility] SLP. Discussed GI recommendation for esophagram and MBSS to objectively assess swallow function and offer strategies or further diet modification that may aid dysphagia symptoms. Will downgrade pt's diet to dysphagia 3 or mechanical soft to see if this aids her odynophagia and globus sensation. Discussed ways to maximize nutrition with smoothies and puree pouches, since pt reported these consistencies are easier to swallow. All questions answered to daughter's satisfaction at this time.   MBSS anticipated Tuesday, 10/28 @ 0930. Recommend esophageal sweeps with trials.   Pain Pain Assessment Pain Scale: 0-10 Pain Score: 0-No pain  Therapy/Group: Individual Therapy  [name] 11/19/2023, 3:39 PM

## 2023-11-19 NOTE — Progress Notes (Signed)
 Physical Therapy Session Note  Patient Details  Name: [name] MRN: [id number] Date of Birth: Sep 13, 1940  Today's Date: 11/19/2023 PT Individual Time: 1000-1042, 8654-8572 PT Individual Time Calculation (min): 42 min, 42 min   Short Term Goals: Week 2:  PT Short Term Goal 1 (Week 2): PT will trial lightweight manual WC and initiate WC evaluation PT Short Term Goal 2 (Week 2): Pt will perform bed to chair transfer with LRAD and mod A PT Short Term Goal 3 (Week 2): Pt will perform sit to stand with LRAD and mod A  Skilled Therapeutic Interventions/Progress Updates:      Treatment 1:  Pt seated in TIS upon arrival. Pt denies pain and agreeable to therapy despite reporting much fatigue.  Pt requested assistance donning sweatshirt. Pt required max A to don sweatshirt. Remainder of session emphasized activity tolerance during transfers and sitting balance.  Pt transported dependent in TIS to main gym. Pt performed squat pivot transfer TIS <> mat with total A.  Seated EOM, pt instructed to maintain upright sitting balance with midline posture, however, required mod/max A and heavy verbal cueing/encouragement to do so. Pt demonstrated tendency to lose balance toward R as trunk control extremely limited.  Pt instructed to perform lateral upper body push ups with L UE, using hand to push up from R side and elbow to push up from L side, though required max A to perform.   Pt instructed to perform x10 seated modified crunches EOM, however, only able to tolerate 5 reps due to fatigue. Max A and significant verbal cueing/encouragement required.  Pt's overall participation in session very limited by fatigue and possibly internal motivation (?).  Once back in room, pt remained seated in TIS, seatbelt alarm on, and all needs within reach upon PT exiting room.  Treatment 2:  Pt received from OT. Pt denies pain and agreeable to therapy.  Completed family training with patent's daughter,  [name], with emphasis on DC planning. PT educated daughter on patient's CLOF being max-total A for bed mobility and transfers. Provided education on option for SNF upon DC for further therapy/care. Provided education on expected level of care upon DC based on current progress - need for 24/7 care, hoyer lift training for transfers, WC level, need for potential home modifications to accommodate WC, etc. Discussed home environment - 1 step up to porch to enter home, single level rental home. Educated patient and daughter on potential WC options (TIS vs power) and plan for WC eval next week. Requested that patient's daughter email photos and/or videos of home environment, doorways, and entrance to provide therapy team with further details for DC planning. All of the patient and her daughter's mobility-related questions were addressed. PT reached out to SW to address daughter's questions regarding [facility] aide and community resources available upon DC.  Pt remained in TIS with all needs within reach upon PT exiting room.  Therapy Documentation Precautions:  Precautions Precautions: Fall Recall of Precautions/Restrictions: Intact Precaution/Restrictions Comments: SBP <180, R hemiparesis Required Braces or Orthoses: Other Brace Other Brace: R LE PRAFO, R UE sling, R UE resting hand splint Restrictions Weight Bearing Restrictions Per Provider Order: No  Therapy/Group: Individual Therapy  [name] [name], DPT 11/19/2023, 7:45 AM

## 2023-11-19 NOTE — Progress Notes (Addendum)
 PROGRESS NOTE    Subjective/Complaints:  Patient noted to be tired this morning by nursing, alert and awake when I saw her shortly after working with therapy.  She was noted to be a little bit fatigued but now appears more or less at baseline.  ROS: Limited due to cognitive/behavioral. Denies CP/SOB Pain with swallowing - chronic  +mild neck pain  Objective:   No results found. Recent Labs    11/18/23 0503 11/19/23 0450  WBC 4.5 4.7  HGB 8.4* 8.6*  HCT 25.1* 25.6*  PLT 135* 125*    Recent Labs    11/17/23 0522 11/18/23 0503  NA 132* 131*  K 4.3 4.3  CL 103 104  CO2 20* 20*  GLUCOSE 105* 113*  BUN 17 14  CREATININE 0.92 0.93  CALCIUM  9.2 8.8*     Intake/Output Summary (Last 24 hours) at 11/19/2023 1427 Last data filed at 11/19/2023 1300 Gross per 24 hour  Intake 1365.45 ml  Output --  Net 1365.45 ml        Physical Exam: Vital Signs Blood pressure (!) 121/42, pulse 77, temperature 98.1 F (36.7 C), resp. rate 18, height 5' 6.5 (1.689 m), weight (S) 53.8 kg, SpO2 100%.   Constitutional: No distress . Vital signs reviewed. Laying in bed, appears comfortable working with therapy HEENT: NCAT, EOMI, oral membranes moist Neck: supple Cardiovascular: RRR without murmur. No JVD    Respiratory/Chest: CTA Bilaterally without wheezes or rales. Normal effort    GI/Abdomen: BS +, non-tender, non-distended Ext: no clubbing, cyanosis, or edema Psych: flat but cooperative  Skin: Clean and intact without signs of breakdown Neuro: Awake and alert, delayed responses.  Minimal verbal output.  Decreased sensation light touch right lower extremity.  Decreased to light touch on RLE< but not RUE or face  RUE- 0/5  LUE 4+/5 throughout RLE- HF 1/5; KE/KF 1-2/5; DF 0/5; PF 2-/5 LLE- 4+/5 throughout  Prior neuro assessment is c/w today's exam 11/19/2023.   Musculoskeletal: No significant neck tenderness or pain  with flexion extension of the neck noted.  No significant periscapular muscle tenderness noted     Assessment/Plan: 1. Functional deficits which require 3+ hours per day of interdisciplinary therapy in a comprehensive inpatient rehab setting. Physiatrist is providing close team supervision and 24 hour management of active medical problems listed below. Physiatrist and rehab team continue to assess barriers to discharge/monitor patient progress toward functional and medical goals  Care Tool:  Bathing    Body parts bathed by patient: Right arm, Left arm, Chest, Abdomen, Front perineal area   Body parts bathed by helper: Abdomen, Chest, Front perineal area, Buttocks, Right upper leg, Left upper leg, Right lower leg, Left lower leg, Face     Bathing assist Assist Level: Total Assistance - Patient < 25%     Upper Body Dressing/Undressing Upper body dressing   What is the patient wearing?: Hospital gown only, Pull over shirt    Upper body assist Assist Level: Total Assistance - Patient < 25%    Lower Body Dressing/Undressing Lower body dressing      What is the patient wearing?: Underwear/pull up     Lower body assist Assist for  lower body dressing: Total Assistance - Patient < 25%     Toileting Toileting    Toileting assist Assist for toileting: Total Assistance - Patient < 25%     Transfers Chair/bed transfer  Transfers assist  Chair/bed transfer activity did not occur: Safety/medical concerns (fatigue)  Chair/bed transfer assist level: Total Assistance - Patient < 25%     Locomotion Ambulation   Ambulation assist   Ambulation activity did not occur: Safety/medical concerns          Walk 10 feet activity   Assist  Walk 10 feet activity did not occur: Safety/medical concerns        Walk 50 feet activity   Assist Walk 50 feet with 2 turns activity did not occur: Safety/medical concerns         Walk 150 feet activity   Assist Walk 150 feet  activity did not occur: Safety/medical concerns         Walk 10 feet on uneven surface  activity   Assist Walk 10 feet on uneven surfaces activity did not occur: Safety/medical concerns         Wheelchair     Assist Is the patient using a wheelchair?: Yes Type of Wheelchair: Manual (TIS)    Wheelchair assist level: Dependent - Patient 0%      Wheelchair 50 feet with 2 turns activity    Assist        Assist Level: Dependent - Patient 0%   Wheelchair 150 feet activity     Assist      Assist Level: Dependent - Patient 0%   Blood pressure (!) 121/42, pulse 77, temperature 98.1 F (36.7 C), resp. rate 18, height 5' 6.5 (1.689 m), weight (S) 53.8 kg, SpO2 100%.  Medical Problem List and Plan: 1. Functional deficits secondary to  bilateral embolic shower, embolic pattern, etiology:  Afib and missed Eliquis  doses vs from NSTEMI with cardiac cath.              -patient may  shower             -ELOS/Goals: 12-15 days min A-              Cannot sit upright without propping            -Continue CIR therapies including PT, OT, and SLP   - Ordered PRAFO/WHO right upper extremity, sling for right upper extremity also ordered  -Will plan for palliative consult for goals of care - notified daughter and pt, pt seen yesterday, appreciate assistance  - Recheck CBC and BMP today due to reports of fatigue but overall appears around her baseline, Check CXR- later in afternoon pt reported to nursing/therapy she had cough in AM that resolved, per therapy patient reporting she feels she may have pneumonia - says she's had it before and feels like it's the same , Will add U/A  -possibly mood related?, she had recent goals of care discussion 2.  Antithrombotics: -DVT/anticoagulation:  Pharmaceutical: Eliquis  Transitioned from IV Heparin  to Eliquis  10/14             -antiplatelet therapy: Asprin 81 mg  3. Pain Management: Gabapentin  and Tylenol  as needed  -Voltaren  R  shoulder for R shoulder pain  - 10/17 patient now reporting some pain in her left shoulder, suspect from increased use with therapy.  Right shoulder doing better.  Will try Voltaren  gel to left shoulder, consider imaging if does not improve  10/18 added kpad for neck/shoulder  as well  -10/22-23 Continue voltaren  gel for occasional shoulder pain  - Can try K-pad for neck as she has some soreness this morning 4. Mood/Behavior/Sleep: LCSW to follow for evaluation and support when available.              -antipsychotic agents: N/A 5. Neuropsych/cognition: This patient is not quite capable of making decisions on her own behalf. 6. Skin/Wound Care: routine pressure relief measures.  7. Fluids/Electrolytes/Nutrition: monitor intake and output. Follow up labs in a.m.              - SLP consult: Carb modified diet + Ensure  -nutrition consult for decreased p.o. intake  10/18 intake remains minimal. RD following   -added megace for appetitte  10/19 obsv today. Recheck labs, prealbumin in AM   10/20 Prealbumin 21 this AM, continue to monitor PO intake  10/22 Hx of pain with swallowing, appears has been chronic for several, nursing reports daughter said yesterday she was seen by GI in the past. 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 Continue aspirin  and statin.   9. CAD: 10/4-10/7 admitted for NSTEMI  10. HLD: Lipitor 40 mg  11. HTN: monitor blood pressures on  Hyzaar, Cardizem , and Aldactone      11/19/2023    2:53 PM 11/19/2023   11:17 AM 11/19/2023    8:00 AM  Vitals with BMI  Weight   118 lbs 10 oz   BMI   18.86  Systolic 136 121   Diastolic 47 42   Pulse 87       Significant value   10/24 a little lower today, continue to monitor   12. Paroxymal Atrial Flutter/Fib: continue  Eliquis  2.5 bid 13. T2DM: HgbA1c 6.0, resumed home Amaryl   - monitor cbg AC/HS with SSI.  CBG (last 3)  Recent Labs    11/19/23 0814 11/19/23 1123 11/19/23 1326  GLUCAP 93 102* 122*  - 10/15 Will order CBG/SSI  - CBGs well-controlled, holding amaryl . But not eating much  -10/20-24 controlled, continue current regimen, continued poor PO intake  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  Hypothyroidism: Synthroid  17.  Chronic anemia: Stable, monitor CBC.   -10/17 Hgb improved to 10 yesterday  -10/18 lower HGB at 8.4 suspect dilutional due to IVF, recheck tomorrow 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 Constipation- will order Milk of Mg per pt request and Miralax  daily  -LBM 10/16 continue to monitor  10/19 needs to eat!!--will give 30cc sorbitol today  10/24 LBM today 20.  Hyponatremia  - 10/16 sodium 130, sodium appears chronically low going back several years.  Continue to monitor  -10/20 NA 131 today, continue to monitor   -10/23 NA stable 131 21. Azotemia  -10/20 IVF NS 50ml/hr  -10/23 BUN and CR improved, continue IVF at night for poor oral intake, can hold if drinks 1L  LOS: 10 days A FACE TO FACE EVALUATION WAS PERFORMED  Gayle Collier 11/19/2023, 2:27 PM

## 2023-11-19 NOTE — Progress Notes (Signed)
 Patient ID: [name], female   DOB: May 23, 1940, 83 y.o.   MRN: [id number]  Spoke with patients daughter regarding forms for the doctor to sign for care in the home. SW informed her to bring the papers in Monday to have them signed.

## 2023-11-19 NOTE — Consult Note (Signed)
 [facility] Gastroenterology Consult  Referring Provider: No ref. provider found Primary Care Physician:  [name], MD Primary Gastroenterologist: [facility] - Dr. Saintclair  Reason for Consultation: Dysphagia, odynophagia  SUBJECTIVE:   HPI: [name] is a 83 y.o. female established with [facility] in outpatient setting. She was diagnosed with left-sided pulmonary adenocarcinoma via bronchoscopy on 06/29/2023, metastatic to lymph nodes, has undergone radiation therapy.  Medical history also significant for breast cancer status post mastectomy in March 2025.  History of gastroesophageal reflux disease, hypothyroidism, diabetes mellitus, SLE with interstitial lung disease and atrial fibrillation on Eliquis . She was seen in [facility] office June 2025 for dysphagia and odynophagia, was recommended to have EGD completed, this was not completed secondary to delays from radiation and no cardiac clearance.   EGD 09/10/2015 for functional dyspepsia and GERD (Dr. Dyane) showed widely patent Schatzki ring, small hiatal hernia, gastritis (biopsy showed benign tissue, H. pylori negative).  Colonoscopy 02/11/2022 for personal history of colon polyp and colorectal cancer in her father (30s) (Dr. Saintclair) showed tubular adenoma x 2 in transverse colon, transverse colon diverticula, internal hemorrhoids.  Presented to hospital on 11/06/2023 with chief complaint of slurred speech and weakness.  MRI of brain showed multiple acute infarcts.  Gastroenterology consultation requested given dysphagia and odynophagia.  Speech-language pathology has evaluated patient, swallowing difficulties noted with thin liquids, she is currently on a soft diet.  SLP evaluation 11/18/2023 showed that patient consumed regular/thin liquids with no overt signs and symptoms apart from slow engagement in overall limited intake.  My evaluation, patient sitting comfortably in wheelchair.  She noted having dysphagia in her upper cervical esophagus,  chronic.  She noted having odynophagia worse with radiation therapy.  She denied chest pain and shortness of breath.  No abdominal pain.  Past Medical History:  Diagnosis Date   Acute bronchitis 12/17/2021   Acute cystitis 02/20/2019   Arthritis    Atrial flutter (HCC)    Breast cancer (HCC)    left  2009 and 2025   Diabetes mellitus without complication (HCC)    Dyspnea    mostly with exertion   GERD (gastroesophageal reflux disease)    History of radiation therapy    Left Lung-08/09/23-10/01/23- Dr. 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[facility])    Lupus    Pneumonia 07/31/2021   Past Surgical History:  Procedure Laterality Date   BREAST BIOPSY Left 01/15/2023   US  LT BREAST BX W LOC DEV 1ST LESION IMG BX SPEC US  GUIDE 01/15/2023 [facility] MAMMOGRAPHY   BREAST LUMPECTOMY Left 2009   BRONCHIAL BIOPSY  06/29/2023   Procedure: BRONCHOSCOPY, WITH BIOPSY;  Surgeon: [name], MD;  Location: [facility] ENDOSCOPY;  Service: Pulmonary;;   BRONCHIAL BRUSHINGS  06/29/2023   Procedure: BRONCHOSCOPY, WITH BRUSH BIOPSY;  Surgeon: [name], MD;  Location: [facility] ENDOSCOPY;  Service: Pulmonary;;   BRONCHIAL NEEDLE ASPIRATION BIOPSY  06/29/2023   Procedure: BRONCHOSCOPY, WITH NEEDLE ASPIRATION BIOPSY;  Surgeon: [name], MD;  Location: [facility] ENDOSCOPY;  Service: Pulmonary;;   IR 3D INDEPENDENT WKST  05/29/2021   IR ANGIO INTRA EXTRACRAN SEL COM CAROTID INNOMINATE BILAT MOD SED  05/29/2021   IR ANGIO VERTEBRAL SEL VERTEBRAL BILAT MOD SED  05/29/2021   LEFT HEART CATH AND CORONARY ANGIOGRAPHY N/A 11/01/2023   Procedure: LEFT HEART CATH AND CORONARY ANGIOGRAPHY;  Surgeon: Wonda Sharper, MD;  Location: [facility] INVASIVE CV LAB;  Service: Cardiovascular;  Laterality: N/A;  SIMPLE MASTECTOMY WITH AXILLARY SENTINEL NODE BIOPSY Left 03/31/2023   Procedure: LEFT MASTECTOMY;  Surgeon: Vernetta Berg, MD;  Location: [facility] OR;  Service: General;  Laterality: Left;  LMA PEC BLOCK    VIDEO BRONCHOSCOPY WITH ENDOBRONCHIAL NAVIGATION Left 06/29/2023   Procedure: VIDEO BRONCHOSCOPY WITH ENDOBRONCHIAL NAVIGATION;  Surgeon: [name], MD;  Location: [facility] ENDOSCOPY;  Service: Pulmonary;  Laterality: Left;   VIDEO BRONCHOSCOPY WITH ENDOBRONCHIAL ULTRASOUND Bilateral 06/29/2023   Procedure: BRONCHOSCOPY, WITH EBUS;  Surgeon: [name], MD;  Location: [facility] ENDOSCOPY;  Service: Pulmonary;  Laterality: Bilateral;   Prior to Admission medications   Medication Sig Start Date End Date Taking? Authorizing Provider  albuterol  (ACCUNEB ) 0.63 MG/3ML nebulizer solution Take 3 mLs (0.63 mg total) by nebulization every 6 (six) hours as needed for wheezing. 07/09/23   Ruthell Lauraine FALCON, NP  albuterol  (VENTOLIN  HFA) 108 (90 Base) MCG/ACT inhaler Inhale 2 puffs into the lungs every 6 (six) hours as needed for wheezing or shortness of breath. 07/09/23   Ruthell Lauraine FALCON, NP  anastrozole  (ARIMIDEX ) 1 MG tablet Take 1 tablet (1 mg total) by mouth daily. 04/08/23   [name], [name], MD  apixaban  (ELIQUIS ) 2.5 MG TABS tablet Take 1 tablet (2.5 mg total) by mouth 2 (two) times daily. 11/09/23   Darci Pore, MD  aspirin  EC 81 MG tablet Take 1 tablet (81 mg total) by mouth daily. Swallow whole. 11/05/23   Ladona Heinz, MD  atorvastatin  (LIPITOR) 40 MG tablet Take 1 tablet (40 mg total) by mouth daily. 11/03/23 12/03/23  Cindy Garnette POUR, MD  diclofenac  Sodium (VOLTAREN ) 1 % GEL Apply 1 application. topically 4 (four) times daily as needed (pain).    [provider]  diltiazem  (CARDIZEM  CD) 120 MG 24 hr capsule Take 1 capsule (120 mg total) by mouth daily. 01/07/23   Dunn, Dayna N, PA-C  gabapentin  (NEURONTIN ) 300 MG capsule Take 300 mg by mouth 3 (three) times daily.    [provider]  glimepiride  (AMARYL ) 1 MG tablet Take 1 mg by mouth daily with breakfast.    [provider]  hydroxychloroquine  (PLAQUENIL ) 200 MG tablet Take 200 mg by mouth daily.    [provider]   Ipratropium-Albuterol  (COMBIVENT RESPIMAT) 20-100 MCG/ACT AERS respimat Inhale 1 puff into the lungs every 6 (six) hours as needed for shortness of breath. 11/16/17   [provider]  levothyroxine  (SYNTHROID ) 100 MCG tablet Take 100 mcg by mouth daily. 02/05/21   [provider]  omeprazole (PRILOSEC) 20 MG capsule Take 20 mg by mouth daily as needed (Indigestion). 12/25/22   [provider]  spironolactone  (ALDACTONE ) 25 MG tablet Take 0.5 tablets (12.5 mg total) by mouth daily. 01/07/23   Dunn, Dayna N, PA-C  sucralfate  (CARAFATE ) 1 g tablet Take 1 g by mouth 4 (four) times daily.    [provider]   Current Facility-Administered Medications  Medication Dose Route Frequency Provider Last Rate Last Admin   0.9 %  sodium chloride  infusion   Intravenous Daily Urbano Albright, MD 50 mL/hr at 11/19/23 0557 Rate Verify at 11/19/23 0557   acetaminophen  (TYLENOL ) tablet 325-650 mg  325-650 mg Oral Q4H PRN Jerilynn Daphne SAILOR, NP   650 mg at 11/18/23 1949   albuterol  (PROVENTIL ) (2.5 MG/3ML) 0.083% nebulizer solution 2.5 mg  2.5 mg Nebulization Q6H PRN Lawrence, Brandi N, NP       alum & mag hydroxide-simeth (MAALOX/MYLANTA) 200-200-20 MG/5ML suspension 30 mL  30 mL Oral Q4H PRN Jerilynn Daphne  N, NP   30 mL at 11/16/23 2055   anastrozole  (ARIMIDEX ) tablet 1 mg  1 mg Oral Daily Jerilynn Daphne SAILOR, NP   1 mg at 11/18/23 9063   apixaban  (ELIQUIS ) tablet 2.5 mg  2.5 mg Oral BID Jerilynn Daphne SAILOR, NP   2.5 mg at 11/18/23 1949   aspirin  EC tablet 81 mg  81 mg Oral Daily Jerilynn Daphne SAILOR, NP   81 mg at 11/18/23 9063   atorvastatin  (LIPITOR) tablet 40 mg  40 mg Oral Daily Lawrence, Brandi N, NP   40 mg at 11/18/23 0936   bisacodyl (DULCOLAX) suppository 10 mg  10 mg Rectal Daily PRN Jerilynn Daphne SAILOR, NP   10 mg at 11/15/23 1845   diclofenac  Sodium (VOLTAREN ) 1 % topical gel 2 g  2 g Topical QID Urbano Albright, MD   2 g at 11/18/23 2215   diltiazem  (CARDIZEM  CD) 24  hr capsule 120 mg  120 mg Oral Daily Lawrence, Brandi N, NP   120 mg at 11/18/23 9063   diphenhydrAMINE  (BENADRYL ) capsule 25 mg  25 mg Oral Q6H PRN Jerilynn Daphne SAILOR, NP       feeding supplement (ENSURE PLUS HIGH PROTEIN) liquid 237 mL  237 mL Oral BID BM Urbano Albright, MD   237 mL at 11/18/23 1400   gabapentin  (NEURONTIN ) capsule 300 mg  300 mg Oral TID Jerilynn Daphne SAILOR, NP   300 mg at 11/18/23 2224   guaiFENesin -dextromethorphan  (ROBITUSSIN DM) 100-10 MG/5ML syrup 5-10 mL  5-10 mL Oral Q6H PRN Jerilynn Daphne SAILOR, NP   10 mL at 11/10/23 2041   hydroxychloroquine  (PLAQUENIL ) tablet 200 mg  200 mg Oral Daily Jerilynn Daphne SAILOR, NP   200 mg at 11/18/23 1041   insulin  aspart (novoLOG ) injection 0-6 Units  0-6 Units Subcutaneous TID WC Urbano Albright, MD       levothyroxine  (SYNTHROID ) tablet 100 mcg  100 mcg Oral Daily Lawrence, Brandi N, NP   100 mcg at 11/19/23 9290   magnesium  hydroxide (MILK OF MAGNESIA) suspension 15 mL  15 mL Oral Daily PRN Lovorn, Duwaine, MD   15 mL at 11/15/23 0539   megestrol (MEGACE) 400 MG/10ML suspension 400 mg  400 mg Oral BID Swartz, Zachary T, MD   400 mg at 11/18/23 0936   multivitamin with minerals tablet 1 tablet  1 tablet Oral Daily Urbano Albright, MD   1 tablet at 11/18/23 0936   pantoprazole  (PROTONIX ) EC tablet 40 mg  40 mg Oral Daily Urbano Albright, MD   40 mg at 11/18/23 0936   polyethylene glycol (MIRALAX  / GLYCOLAX ) packet 17 g  17 g Oral Daily Lovorn, Megan, MD   17 g at 11/18/23 9061   prochlorperazine (COMPAZINE) tablet 5-10 mg  5-10 mg Oral Q6H PRN Jerilynn Daphne SAILOR, NP       Or   prochlorperazine (COMPAZINE) suppository 12.5 mg  12.5 mg Rectal Q6H PRN Jerilynn Daphne SAILOR, NP       Or   prochlorperazine (COMPAZINE) injection 5-10 mg  5-10 mg Intravenous Q6H PRN Jerilynn Daphne SAILOR, NP       senna-docusate (Senokot-S) tablet 1 tablet  1 tablet Oral QHS PRN Lawrence, Brandi N, NP   1 tablet at 11/13/23 2010   traZODone  (DESYREL ) tablet 25-50  mg  25-50 mg Oral QHS PRN Jerilynn Daphne SAILOR, NP   50 mg at 11/16/23 2055   Allergies as of 11/09/2023 - Review Complete 11/09/2023  Allergen Reaction Noted   Ciprofloxacin  Itching, Swelling, and Other (See Comments) 06/20/2014   Crestor  [rosuvastatin ] Other (See Comments) 09/15/2019   Lisinopril  Cough 05/27/2022   Ofev  [nintedanib] Nausea Only 01/13/2022   Family History  Problem Relation Age of Onset   Cancer Father        prostate   Prostate cancer Father 66 - 74       metaststic   Asthma Brother    Cancer Daughter        breast   Breast cancer Daughter 1   Social History   Socioeconomic History   Marital status: Single    Spouse name: Not on file   Number of children: 2   Years of education: Not on file   Highest education level: Not on file  Occupational History   Not on file  Tobacco Use   Smoking status: Never    Passive exposure: Never   Smokeless tobacco: Not on file  Vaping Use   Vaping status: Never Used  Substance and Sexual Activity   Alcohol  use: No   Drug use: No   Sexual activity: Not Currently  Other Topics Concern   Not on file  Social History Narrative   Not on file   Social Drivers of Health   Financial Resource Strain: Not on file  Food Insecurity: Food Insecurity Present (11/09/2023)   Hunger Vital Sign    Worried About Running Out of Food in the Last Year: Sometimes true    Ran Out of Food in the Last Year: Sometimes true  Transportation Needs: No Transportation Needs (11/09/2023)   PRAPARE - Administrator, Civil Service (Medical): No    Lack of Transportation (Non-Medical): No  Physical Activity: Not on file  Stress: Not on file  Social Connections: Moderately Integrated (11/07/2023)   Social Connection and Isolation Panel    Frequency of Communication with Friends and Family: More than three times a week    Frequency of Social Gatherings with Friends and Family: Once a week    Attends Religious Services: More than 4  times per year    Active Member of [employer] or Organizations: Yes    Attends Banker Meetings: 1 to 4 times per year    Marital Status: Divorced  Intimate Partner Violence: Not At Risk (11/09/2023)   Humiliation, Afraid, Rape, and Kick questionnaire    Fear of Current or Ex-Partner: No    Emotionally Abused: No    Physically Abused: No    Sexually Abused: No   Review of Systems:  Review of Systems  Respiratory:  Negative for shortness of breath.   Cardiovascular:  Negative for chest pain.  Gastrointestinal:  Negative for abdominal pain, nausea and vomiting.       Dysphagia, odynophagia    OBJECTIVE:   Temp:  [98.1 F (36.7 C)-98.6 F (37 C)] 98.1 F (36.7 C) (10/24 0436) Pulse Rate:  [77-89] 77 (10/24 0436) Resp:  [16-18] 18 (10/24 0436) BP: (121-143)/(42-56) 121/42 (10/24 0436) SpO2:  [100 %] 100 % (10/24 0436) Weight:  [53.8 kg] 53.8 kg (10/24 0800) Last BM Date : 11/18/23 Physical Exam Constitutional:      General: She is not in acute distress.    Appearance: She is underweight. She is ill-appearing. She is not toxic-appearing.  Cardiovascular:     Rate and Rhythm: Normal rate and regular rhythm.  Pulmonary:     Effort: No respiratory distress.     Breath sounds: Normal breath sounds.  Abdominal:  General: Bowel sounds are normal. There is no distension.     Palpations: Abdomen is soft.     Tenderness: There is no abdominal tenderness. There is no guarding.  Neurological:     Mental Status: She is alert.     Labs: Recent Labs    11/18/23 0503 11/19/23 0450  WBC 4.5 4.7  HGB 8.4* 8.6*  HCT 25.1* 25.6*  PLT 135* 125*   BMET Recent Labs    11/17/23 0522 11/18/23 0503  NA 132* 131*  K 4.3 4.3  CL 103 104  CO2 20* 20*  GLUCOSE 105* 113*  BUN 17 14  CREATININE 0.92 0.93  CALCIUM  9.2 8.8*   LFT No results for input(s): PROT, ALBUMIN, AST, ALT, ALKPHOS, BILITOT, BILIDIR, IBILI in the last 72 hours. PT/INR No results for  input(s): LABPROT, INR in the last 72 hours.  Diagnostic imaging: No results found.  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

## 2023-11-19 NOTE — Progress Notes (Signed)
 Occupational Therapy Session Note  Patient Details  Name: [name] MRN: [id number] Date of Birth: 04-Aug-1940  Session 1: Today's Date: 11/19/2023 OT Individual Time: 9153-9066 OT Individual Time Calculation (min): 47 min    Session 2: Today's Date: 11/19/2023 OT Individual Time: 8699-8654 OT Individual Time Calculation (min): 45 min   Short Term Goals: Week 2:  OT Short Term Goal 1 (Week 2): patient will demonstrate toilet transfers with maxA OT Short Term Goal 2 (Week 2): patient will complete LB donning with max A of 1 OT Short Term Goal 3 (Week 2): patient be able to complete static standing in prep for ADLS with max A   Session 1: Skilled Therapeutic Interventions/Progress Updates:  Patient agreeable to participate in OT session. Reports 4/10 pain level.   Patient participated in skilled OT session focusing on dynamic sitting balance, eating ,dressing tasks. Patient very drowsy upon OT arrival. Nursing reported drowsiness earlier in morning to MD. Patient able to become more alert with movement. Patient completed eating sitting on EOB with x2 assist for balance and functional use of UE. Patient demonstrated decreased sitting balance today vs previous session. Patient required mod A to sit upright and mod A for eating breakfast with use of non affected extremity. Patient required several cues for placement and positioning of affected extremity due to poor proprioception. Patient then donned pants total Ax2 and transferred to chair total A. Left with nursing and NT for continued eating.    Session 2: Skilled Therapeutic Interventions/Progress Updates:   Patient received in room waiting for skilled OT. Patient had complaints of pain 6/10 in neck. OT repositioned patient to increase comfort. Patient max to total A to EOB. Patient demonstrated increased fatigue today during second session limiting performance. Patient complete transfer with total A. Patient transferred to therapy  gym. Completed patient repositioning in tilt in space chair to increase ability to complete activities. Patient placed on NuStep utilizing Nugrip for affected UE. Patient required max A to complete 15 rotations with 0 level on NuSTEP UBE. Patients daughter then arrived.    Completed family training with patients daughter. Therapist educated family regarding patients current progress, patients transfer levels, UB/ LB dressing and bathing techniques, and techniques needed to complete them. Education also provided on strategies and compensatory techniques to use if patient requires family assist at her current level of performance. Educated daughter on patients intensive needs for self care management  Therapy Documentation Precautions:  Precautions Precautions: Fall Recall of Precautions/Restrictions: Intact Precaution/Restrictions Comments: SBP <180, R hemiparesis Required Braces or Orthoses: Other Brace Other Brace: R LE PRAFO, R UE sling, R UE resting hand splint Restrictions Weight Bearing Restrictions Per Provider Order: No Therapy/Group: Individual Therapy  [name] 11/19/2023, 7:24 AM

## 2023-11-20 LAB — BASIC METABOLIC PANEL WITH GFR
Anion gap: 12 (ref 5–15)
BUN: 18 mg/dL (ref 8–23)
CO2: 17 mmol/L — ABNORMAL LOW (ref 22–32)
Calcium: 9 mg/dL (ref 8.9–10.3)
Chloride: 99 mmol/L (ref 98–111)
Creatinine, Ser: 1.02 mg/dL — ABNORMAL HIGH (ref 0.44–1.00)
GFR, Estimated: 55 mL/min — ABNORMAL LOW (ref >60)
Glucose, Bld: 114 mg/dL — ABNORMAL HIGH (ref 70–99)
Potassium: 4.3 mmol/L (ref 3.5–5.1)
Sodium: 128 mmol/L — ABNORMAL LOW (ref 135–145)

## 2023-11-20 LAB — GLUCOSE, CAPILLARY
Glucose-Capillary: 105 mg/dL — ABNORMAL HIGH (ref 70–99)
Glucose-Capillary: 81 mg/dL (ref 70–99)
Glucose-Capillary: 127 mg/dL — ABNORMAL HIGH (ref 70–99)
Glucose-Capillary: 122 mg/dL — ABNORMAL HIGH (ref 70–99)

## 2023-11-20 LAB — OSMOLALITY: Osmolality: 277 mosm/kg (ref 275–295)

## 2023-11-20 MED ORDER — SODIUM CHLORIDE 0.9 % IV SOLN
Freq: Every day | INTRAVENOUS | Status: DC
Start: 2023-11-21 — End: 2023-11-21

## 2023-11-20 NOTE — Progress Notes (Signed)
 Alertness improving from day prior. Still drowsy at times this morning. Able to open eyes when entering room and make eye contact. Endorsing neck pain appears around torticollis area. Continues to lean neck to the right while sleeping. Scheduled topical pain cream applied to left neck area. Talked to patient about letting staff know about pain. Continues to have moments of operative guarding of sites of her left eye and neck. Explained importance of managing her pain. Also, reinforced importance of staying hydrated. Encouraged drinking fluids through the day. Reluctance to taking medication. Medication crushed this morning and able to open mouth wider today to take medication. Refusing suspension medication at this time. Attempting to eat breakfast. Right leg weaker than day prior and unable to differentiate which leg is being touched this morning. Initially denied any vision issues but reported later impairment with her vision on the left side. Lights made brighter. No ataxia observed on the left side today. Continues to have episodes of incontinence urine having a strong ammonia smell this morning.

## 2023-11-20 NOTE — Progress Notes (Signed)
 PROGRESS NOTE    Subjective/Complaints: No events overnight.  Patient lethargic this a.m., difficulty keeping eyes open during interview, but denies any complaints.  Vitals stable     11/20/2023    8:46 AM 11/20/2023    4:45 AM 11/19/2023    9:15 PM  Vitals with BMI  Systolic 132 102 868  Diastolic 57 40 53  Pulse  86 96    Recent Labs    11/19/23 1806 11/19/23 2120 11/20/23 0638  GLUCAP 99 154* 105*      ROS: Limited due to cognitive/behavioral. Denies CP/SOB + Painful swallowing - chronic  +mild neck pain-no complaints today  Objective:   DG Chest 2 View Result Date: 11/19/2023 CLINICAL DATA:  Cough. EXAM: DG CHEST 2V COMPARISON:  Radiograph 10/30/2023, CT 06/19/2023 FINDINGS: Upper normal heart size. Stable mediastinal contours with aortic atherosclerosis. Scattered areas of parenchymal scarring. Peripheral left lower lobe opacity may be related to treatment related changes, lung nodule on prior CT. Small left pleural effusion. No pulmonary edema. No pneumothorax. IMPRESSION: Peripheral left lower lobe opacity may be related to treatment related changes, lung nodule on prior CT. Small left pleural effusion. Electronically Signed   By: Andrea Gasman M.D.   On: 11/19/2023 20:15   Recent Labs    11/19/23 0450 11/19/23 1622  WBC 4.7 5.1  HGB 8.6* 10.1*  HCT 25.6* 32.0*  PLT 125* 125*    Recent Labs    11/18/23 0503 11/19/23 2025  NA 131* 128*  K 4.3 3.9  CL 104 98  CO2 20* 18*  GLUCOSE 113* 167*  BUN 14 15  CREATININE 0.93 1.00  CALCIUM  8.8* 9.1     Intake/Output Summary (Last 24 hours) at 11/20/2023 0850 Last data filed at 11/19/2023 1300 Gross per 24 hour  Intake 240 ml  Output --  Net 240 ml        Physical Exam: Vital Signs Blood pressure (!) 132/57, pulse 86, temperature 98.5 F (36.9 C), resp. rate 18, height 5' 6.5 (1.689 m), weight (S) 53.8 kg, SpO2  100%.   Constitutional: No distress . Vital signs reviewed.  Sitting upright in bedside chair. HEENT: NCAT, EOMI, oral membranes moist Neck: supple Cardiovascular: RRR without murmur. No JVD    Respiratory/Chest: CTA Bilaterally without wheezes or rales. Normal effort    GI/Abdomen: BS +, non-tender, non-distended Ext: no clubbing, cyanosis, or edema Psych: flat but cooperative  Skin: Clean and intact without signs of breakdown  Neuro: Awake and alert, delayed responses.  Minimal verbal output.  Lethargic, but appropriate.  Decreased sensation light touch right lower extremity.  Decreased to light touch on RLE< but not RUE or face  RUE- 0/5  LUE 4+/5 throughout RLE- HF 1/5; KE/KF 1-2/5; DF 0/5; PF 2-/5 LLE- 4+/5 throughout  Prior neuro assessment is c/w today's exam 11/20/2023.   Musculoskeletal: No significant neck tenderness or pain with flexion extension of the neck noted.  No significant periscapular muscle tenderness noted     Assessment/Plan: 1. Functional deficits which require 3+ hours per day of interdisciplinary therapy in a comprehensive inpatient rehab setting. Physiatrist is providing close team supervision and 24 hour management of active medical problems  listed below. Physiatrist and rehab team continue to assess barriers to discharge/monitor patient progress toward functional and medical goals  Care Tool:  Bathing    Body parts bathed by patient: Right arm, Left arm, Chest, Abdomen, Front perineal area   Body parts bathed by helper: Abdomen, Chest, Front perineal area, Buttocks, Right upper leg, Left upper leg, Right lower leg, Left lower leg, Face     Bathing assist Assist Level: Total Assistance - Patient < 25%     Upper Body Dressing/Undressing Upper body dressing   What is the patient wearing?: Hospital gown only, Pull over shirt    Upper body assist Assist Level: Total Assistance - Patient < 25%    Lower Body Dressing/Undressing Lower body  dressing      What is the patient wearing?: Underwear/pull up     Lower body assist Assist for lower body dressing: Total Assistance - Patient < 25%     Toileting Toileting    Toileting assist Assist for toileting: Total Assistance - Patient < 25%     Transfers Chair/bed transfer  Transfers assist  Chair/bed transfer activity did not occur: Safety/medical concerns (fatigue)  Chair/bed transfer assist level: Total Assistance - Patient < 25%     Locomotion Ambulation   Ambulation assist   Ambulation activity did not occur: Safety/medical concerns          Walk 10 feet activity   Assist  Walk 10 feet activity did not occur: Safety/medical concerns        Walk 50 feet activity   Assist Walk 50 feet with 2 turns activity did not occur: Safety/medical concerns         Walk 150 feet activity   Assist Walk 150 feet activity did not occur: Safety/medical concerns         Walk 10 feet on uneven surface  activity   Assist Walk 10 feet on uneven surfaces activity did not occur: Safety/medical concerns         Wheelchair     Assist Is the patient using a wheelchair?: Yes Type of Wheelchair: Manual (TIS)    Wheelchair assist level: Dependent - Patient 0%      Wheelchair 50 feet with 2 turns activity    Assist        Assist Level: Dependent - Patient 0%   Wheelchair 150 feet activity     Assist      Assist Level: Dependent - Patient 0%   Blood pressure (!) 132/57, pulse 86, temperature 98.5 F (36.9 C), resp. rate 18, height 5' 6.5 (1.689 m), weight (S) 53.8 kg, SpO2 100%.  Medical Problem List and Plan: 1. Functional deficits secondary to  bilateral embolic shower, embolic pattern, etiology:  Afib and missed Eliquis  doses vs from NSTEMI with cardiac cath.              -patient may  shower             -ELOS/Goals: 12-15 days min A-              Cannot sit upright without propping            -Continue CIR therapies  including PT, OT, and SLP   - Ordered PRAFO/WHO right upper extremity, sling for right upper extremity also ordered  -Will plan for palliative consult for goals of care - notified daughter and pt, pt seen yesterday, appreciate assistance  - Recheck CBC and BMP today due to reports of fatigue but overall  appears around her baseline, Check CXR- later in afternoon pt reported to nursing/therapy she had cough in AM that resolved, per therapy patient reporting she feels she may have pneumonia - says she's had it before and feels like it's the same , Will add U/A  -possibly mood related?, she had recent goals of care discussion   10-25: Chest x-ray unchanged, increasingly hyponatremic.  Increase IV fluids to 75 cc/h, urinalysis pending.  2.  Antithrombotics: -DVT/anticoagulation:  Pharmaceutical: Eliquis  Transitioned from IV Heparin  to Eliquis  10/14             -antiplatelet therapy: Asprin 81 mg  3. Pain Management: Gabapentin  and Tylenol  as needed  -Voltaren  R shoulder for R shoulder pain  - 10/17 patient now reporting some pain in her left shoulder, suspect from increased use with therapy.  Right shoulder doing better.  Will try Voltaren  gel to left shoulder, consider imaging if does not improve  10/18 added kpad for neck/shoulder as well  -10/22-23 Continue voltaren  gel for occasional shoulder pain  - Can try K-pad for neck as she has some soreness this morning  10-25 no complaints of pain  4. Mood/Behavior/Sleep: LCSW to follow for evaluation and support when available.              -antipsychotic agents: N/A 5. Neuropsych/cognition: This patient is not quite capable of making decisions on her own behalf. 6. Skin/Wound Care: routine pressure relief measures.  7. Fluids/Electrolytes/Nutrition: monitor intake and output. Follow up labs in a.m.              - SLP consult: Carb modified diet + Ensure  -nutrition consult for decreased p.o. intake  10/18 intake remains minimal. RD  following   -added megace for appetitte  10/19 obsv today. Recheck labs, prealbumin in AM   10/20 Prealbumin 21 this AM, continue to monitor PO intake  10/22 Hx of pain with swallowing, appears has been chronic for several, nursing reports daughter said yesterday she was seen by GI in the past. 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 Continue aspirin  and statin.   9. CAD: 10/4-10/7 admitted for NSTEMI  10. HLD: Lipitor 40 mg  11. HTN: monitor blood pressures on  Hyzaar, Cardizem , and Aldactone      11/20/2023    8:46 AM 11/20/2023    4:45 AM 11/19/2023    9:15 PM  Vitals with BMI  Systolic 132 102 868  Diastolic 57 40 53  Pulse  86 96   10/24 a little lower today, continue to monitor   12. Paroxymal Atrial Flutter/Fib: continue Eliquis  2.5 bid  13. T2DM: HgbA1c 6.0, resumed home Amaryl   - monitor cbg AC/HS with SSI.  CBG (last 3)  Recent Labs    11/19/23 1806 11/19/23 2120 11/20/23 0638  GLUCAP 99 154* 105*  - 10/15 Will order CBG/SSI  - CBGs well-controlled, holding amaryl . But not eating much  -10/20-25 controlled, continue current regimen, continued poor PO intake  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  Hypothyroidism: Synthroid  17.  Chronic anemia: Stable, monitor CBC.   -10/17 Hgb improved to 10 yesterday  -10/18 lower HGB  at 8.4 suspect dilutional due to IVF, recheck tomorrow  - CBC tomorrow a.m.  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 Constipation- will order Milk of Mg per pt request and Miralax  daily  -LBM 10/16 continue to monitor  10/19 needs to eat!!--will give 30cc sorbitol today  10/24 LBM today  20.  Hyponatremia  - 10/16 sodium  130, sodium appears chronically low going back several years.  Continue to monitor  -10/20 NA 131 today, continue to monitor   -10/23 NA stable 131  10-25: Sodium 128 yesterday, repeat today similar.  Serum osmole's within normal limits, increasing IV fluids from 50 to 75 cc/h overnight and repeat labs in AM.  Placed 1200 cc p.o. fluid restriction.  Urine studies pending.  21. Azotemia  -10/20 IVF NS 50ml/hr  -10/23 BUN and CR improved, continue IVF at night for poor oral intake, can hold if drinks 1L  10-25: Creatinine increased to 1.02; increasing IV fluids as above  LOS: 11 days A FACE TO FACE EVALUATION WAS PERFORMED  Joesph JAYSON Likes 11/20/2023, 8:50 AM

## 2023-11-20 NOTE — Progress Notes (Signed)
 Occupational Therapy Session Note  Patient Details  Name: [name] MRN: [id number] Date of Birth: 1940/10/13  Today's Date: 11/20/2023 OT Individual Time: 0848-1000 OT Individual Time Calculation (min): 72 min    Short Term Goals: Week 2:  OT Short Term Goal 1 (Week 2): patient will demonstrate toilet transfers with maxA OT Short Term Goal 2 (Week 2): patient will complete LB donning with max A of 1 OT Short Term Goal 3 (Week 2): patient be able to complete static standing in prep for ADLS with max A   Skilled Therapeutic Interventions/Progress Updates:   Patient agreeable to participate in OT session. Reports some pain level in head and neck. Did not give numerical score to OT however nursing provided cream to neck and pain relief medication. Patient received with nursing fining medication. Patient then set up in proper positioning with HOB elevated to facilitate eating of breakfast.. OT utilized verbal cueing and HOH to increase patients eating and ability to manage utensils Ind. Patient able to eat upright with decreased coordination utilizing LUE. Patient then able to come up to mouth with undershooting for mouth accuracy. Patient and OT completed UB dressing max A, LB dressing total A with rolling. Patient max A to total A to roll R, dep to roll L.. patient then transferred to chair squat pivot total A. Set up in chair with good UE positioning due to poor body awareness and all needs in reach.    Therapy Documentation Precautions:  Precautions Precautions: Fall Recall of Precautions/Restrictions: Intact Precaution/Restrictions Comments: SBP <180, R hemiparesis Required Braces or Orthoses: Other Brace Other Brace: R LE PRAFO, R UE sling, R UE resting hand splint Restrictions Weight Bearing Restrictions Per Provider Order: No  Therapy/Group: Individual Therapy  D'mariea L Ellieana Dolecki 11/20/2023, 7:19 AM

## 2023-11-20 NOTE — Progress Notes (Signed)
 Physical Therapy Session Note  Patient Details  Name: [name] MRN: [id number] Date of Birth: 1940/10/15  Today's Date: 11/20/2023 PT Individual Time: 1112-1157 PT Individual Time Calculation (min): 45 min   Short Term Goals: Week 1:  PT Short Term Goal 1 (Week 1): pt will toelrate sitting in TIS WC between sessions PT Short Term Goal 1 - Progress (Week 1): Met PT Short Term Goal 2 (Week 1): Pt will perform bed to chair transfer with LRAD and max A PT Short Term Goal 2 - Progress (Week 1): Met PT Short Term Goal 3 (Week 1): pt will perform sit to stand with LRAD and max A +1 PT Short Term Goal 3 - Progress (Week 1): Met  Skilled Therapeutic Interventions/Progress Updates:  Pt was seen bedside in the am. Pt sitting up in tilt in space w/c. Pt fatigued but willing to participate with therapy. Pt transported to rehab gym. Pt transferred w/c to edge of mat with mod to max A and verbal cues. Pt tolerated edge of bed with contact to guard to min A with verbal and tactile cues to maintain upright posture. Pt transferred edge of mat to supine with max to total A and verbal cues. Pt performed 2 sets x 10 reps each, heel slides, hip bad/add, SAQs, AAROM R LE and AROM to AAROM L LE. Pt transferred supine to edge of mat with total A. Pt transferred edge of mat to w/c with max A and verbal cues. Pt transported back to toysrus. Pt transferred w/c to edge of bed with max A. Pt transferred edge of bed to supine with max A and verbal cues. Pt left sitting up in bed with bed alarm and all needs within reach.   Therapy Documentation Precautions:  Precautions Precautions: Fall Recall of Precautions/Restrictions: Intact Precaution/Restrictions Comments: SBP <180, R hemiparesis Required Braces or Orthoses: Other Brace Other Brace: R LE PRAFO, R UE sling, R UE resting hand splint Restrictions Weight Bearing Restrictions Per Provider Order: No General:   Pain: No c/o pain  Therapy/Group: Individual  Therapy  Merilee Lynwood MATSU 11/20/2023, 12:07 PM

## 2023-11-20 NOTE — Progress Notes (Signed)
 [facility] Progress Note  SUBJECTIVE:   Interval history: [name] was seen and evaluated today at bedside. Resting in bed. Noted having a sore throat. Tolerating diet when food [name] cut in small pieces. No chest pain or shortness of breath.   Past Medical History:  Diagnosis Date   Acute bronchitis 12/17/2021   Acute cystitis 02/20/2019   Arthritis    Atrial flutter (HCC)    Breast cancer (HCC)    left  2009 and 2025   Diabetes mellitus without complication (HCC)    Dyspnea    mostly with exertion   GERD (gastroesophageal reflux disease)    History of radiation therapy    Left Lung-08/09/23-10/01/23- Dr. 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[facility])    Lupus    Pneumonia 07/31/2021   Past Surgical History:  Procedure Laterality Date   BREAST BIOPSY Left 01/15/2023   US  LT BREAST BX W LOC DEV 1ST LESION IMG BX SPEC US  GUIDE 01/15/2023 [facility] MAMMOGRAPHY   BREAST LUMPECTOMY Left 2009   BRONCHIAL BIOPSY  06/29/2023   Procedure: BRONCHOSCOPY, WITH BIOPSY;  Surgeon: [name], MD;  Location: [facility] ENDOSCOPY;  Service: Pulmonary;;   BRONCHIAL BRUSHINGS  06/29/2023   Procedure: BRONCHOSCOPY, WITH BRUSH BIOPSY;  Surgeon: [name], MD;  Location: [facility]OSCOPY;  Service: Pulmonary;;   BRONCHIAL NEEDLE ASPIRATION BIOPSY  06/29/2023   Procedure: BRONCHOSCOPY, WITH NEEDLE ASPIRATION BIOPSY;  Surgeon: [name], MD;  Location: [facility] ENDOSCOPY;  Service: Pulmonary;;   IR 3D INDEPENDENT WKST  05/29/2021   IR ANGIO INTRA EXTRACRAN SEL COM CAROTID INNOMINATE BILAT MOD SED  05/29/2021   IR ANGIO VERTEBRAL SEL VERTEBRAL BILAT MOD SED  05/29/2021   LEFT HEART CATH AND CORONARY ANGIOGRAPHY N/A 11/01/2023   Procedure: LEFT HEART CATH AND CORONARY ANGIOGRAPHY;  Surgeon: Wonda Sharper, MD;  Location: [facility] INVASIVE CV LAB;  Service: Cardiovascular;  Laterality: N/A;   SIMPLE MASTECTOMY WITH AXILLARY SENTINEL NODE BIOPSY Left 03/31/2023    Procedure: LEFT MASTECTOMY;  Surgeon: Vernetta Berg, MD;  [facility];  Service: General;  Laterality: Left;  LMA PEC BLOCK   VIDEO BRONCHOSCOPY WITH ENDOBRONCHIAL NAVIGATION Left 06/29/2023   Procedure: VIDEO BRONCHOSCOPY WITH ENDOBRONCHIAL NAVIGATION;  Surgeon: [name], MD;  Location: [facility] ENDOSCOPY;  Service: Pulmonary;  Laterality: Left;   VIDEO BRONCHOSCOPY WITH ENDOBRONCHIAL ULTRASOUND Bilateral 06/29/2023   Procedure: BRONCHOSCOPY, WITH EBUS;  Surgeon: [name], MD;  Location: [facility] ENDOSCOPY;  Service: Pulmonary;  Laterality: Bilateral;   Current Facility-Administered Medications  Medication Dose Route Frequency Provider Last Rate Last Admin   0.9 %  sodium chloride  infusion   Intravenous Daily Urbano Albright, MD 50 mL/hr at 11/19/23 0557 Rate Verify at 11/19/23 0557   acetaminophen  (TYLENOL ) tablet 325-650 mg  325-650 mg Oral Q4H PRN Jerilynn Daphne SAILOR, NP   650 mg at 11/20/23 0002   albuterol  (PROVENTIL ) (2.5 MG/3ML) 0.083% nebulizer solution 2.5 mg  2.5 mg Nebulization Q6H PRN Lawrence, Brandi N, NP       alum & mag hydroxide-simeth (MAALOX/MYLANTA) 200-200-20 MG/5ML suspension 30 mL  30 mL Oral Q4H PRN Lawrence, Brandi N, NP   30 mL at 11/16/23 2055   anastrozole  (ARIMIDEX ) tablet 1 mg  1 mg Oral Daily Jerilynn Daphne SAILOR, NP   1 mg at 11/20/23 0848   apixaban  (ELIQUIS ) tablet 2.5 mg  2.5 mg Oral BID Jerilynn Daphne SAILOR, NP   2.5 mg at 11/20/23  0848   aspirin  EC tablet 81 mg  81 mg Oral Daily Jerilynn Daphne SAILOR, NP   81 mg at 11/20/23 0848   atorvastatin  (LIPITOR) tablet 40 mg  40 mg Oral Daily Lawrence, Brandi N, NP   40 mg at 11/20/23 0848   bisacodyl (DULCOLAX) suppository 10 mg  10 mg Rectal Daily PRN Jerilynn Daphne SAILOR, NP   10 mg at 11/15/23 1845   diclofenac  Sodium (VOLTAREN ) 1 % topical gel 2 g  2 g Topical QID Urbano Albright, MD   2 g at 11/20/23 1514   diltiazem  (CARDIZEM  CD) 24 hr capsule 120 mg  120 mg Oral Daily Lawrence, Brandi N, NP   120 mg at 11/20/23  9153   diphenhydrAMINE  (BENADRYL ) capsule 25 mg  25 mg Oral Q6H PRN Jerilynn Daphne SAILOR, NP       feeding supplement (ENSURE PLUS HIGH PROTEIN) liquid 237 mL  237 mL Oral BID BM Urbano Albright, MD   237 mL at 11/20/23 1515   gabapentin  (NEURONTIN ) capsule 300 mg  300 mg Oral TID Jerilynn Daphne SAILOR, NP   300 mg at 11/20/23 1515   guaiFENesin -dextromethorphan  (ROBITUSSIN DM) 100-10 MG/5ML syrup 5-10 mL  5-10 mL Oral Q6H PRN Jerilynn Daphne SAILOR, NP   10 mL at 11/10/23 2041   hydroxychloroquine  (PLAQUENIL ) tablet 200 mg  200 mg Oral Daily Jerilynn Daphne SAILOR, NP   200 mg at 11/20/23 9152   insulin  aspart (novoLOG ) injection 0-6 Units  0-6 Units Subcutaneous TID WC Urbano Albright, MD       levothyroxine  (SYNTHROID ) tablet 100 mcg  100 mcg Oral Daily Lawrence, Brandi N, NP   100 mcg at 11/20/23 0625   magnesium  hydroxide (MILK OF MAGNESIA) suspension 15 mL  15 mL Oral Daily PRN Lovorn, Duwaine, MD   15 mL at 11/15/23 0539   megestrol (MEGACE) 400 MG/10ML suspension 400 mg  400 mg Oral BID Swartz, Zachary T, MD   400 mg at 11/19/23 2300   multivitamin with minerals tablet 1 tablet  1 tablet Oral Daily Urbano Albright, MD   1 tablet at 11/20/23 0847   pantoprazole  (PROTONIX ) EC tablet 40 mg  40 mg Oral Daily Urbano Albright, MD   40 mg at 11/20/23 0848   polyethylene glycol (MIRALAX  / GLYCOLAX ) packet 17 g  17 g Oral Daily Lovorn, Megan, MD   17 g at 11/20/23 0848   prochlorperazine (COMPAZINE) tablet 5-10 mg  5-10 mg Oral Q6H PRN Jerilynn Daphne SAILOR, NP       Or   prochlorperazine (COMPAZINE) suppository 12.5 mg  12.5 mg Rectal Q6H PRN Jerilynn Daphne SAILOR, NP       Or   prochlorperazine (COMPAZINE) injection 5-10 mg  5-10 mg Intravenous Q6H PRN Jerilynn Daphne SAILOR, NP       senna-docusate (Senokot-S) tablet 1 tablet  1 tablet Oral QHS PRN Lawrence, Brandi N, NP   1 tablet at 11/13/23 2010   sucralfate  (CARAFATE ) 1 GM/10ML suspension 1 g  1 g Oral TID WC & HS Kriss Stagger H, DO   1 g at 11/20/23  1253   traZODone  (DESYREL ) tablet 25-50 mg  25-50 mg Oral QHS PRN Jerilynn Daphne SAILOR, NP   50 mg at 11/16/23 2055   Allergies as of 11/09/2023 - Review Complete 11/09/2023  Allergen Reaction Noted   Ciprofloxacin Itching, Swelling, and Other (See Comments) 06/20/2014   Crestor  [rosuvastatin ] Other (See Comments) 09/15/2019   Lisinopril  Cough 05/27/2022   Ofev  [nintedanib] Nausea Only  01/13/2022   Review of Systems:  Review of Systems  HENT:  Positive for sore throat.   Respiratory:  Negative for shortness of breath.   Cardiovascular:  Negative for chest pain.  Gastrointestinal:  Negative for nausea and vomiting.    OBJECTIVE:   Temp:  [98 F (36.7 C)-98.6 F (37 C)] 98 F (36.7 C) (10/25 1524) Pulse Rate:  [79-96] 79 (10/25 1524) Resp:  [18] 18 (10/25 1524) BP: (102-132)/(40-57) 118/51 (10/25 1524) SpO2:  [100 %] 100 % (10/25 1524) Last BM Date : 11/18/23 Physical Exam Constitutional:      General: [name] not in acute distress.    Appearance: [name] not ill-appearing, toxic-appearing or diaphoretic.  Cardiovascular:     Comments: Regular pulse Pulmonary:     Effort: No respiratory distress.  Abdominal:     General: There [name] no distension.     Palpations: Abdomen [name] soft.     Tenderness: There [name] no abdominal tenderness.  Neurological:     Mental Status: [name] alert.     Labs: Recent Labs    11/18/23 0503 11/19/23 0450 11/19/23 1622  WBC 4.5 4.7 5.1  HGB 8.4* 8.6* 10.1*  HCT 25.1* 25.6* 32.0*  PLT 135* 125* 125*   BMET Recent Labs    11/18/23 0503 11/19/23 2025 11/20/23 1555  NA 131* 128* 128*  K 4.3 3.9 4.3  CL 104 98 99  CO2 20* 18* 17*  GLUCOSE 113* 167* 114*  BUN 14 15 18   CREATININE 0.93 1.00 1.02*  CALCIUM  8.8* 9.1 9.0   LFT No results for input(s): PROT, ALBUMIN, AST, ALT, ALKPHOS, BILITOT, BILIDIR, IBILI in the last 72 hours. PT/INR No results for input(s): LABPROT, INR in the last 72 hours. Diagnostic imaging: DG  Chest 2 View Result Date: 11/19/2023 CLINICAL DATA:  Cough. EXAM: DG CHEST 2V COMPARISON:  Radiograph 10/30/2023, CT 06/19/2023 FINDINGS: Upper normal heart size. Stable mediastinal contours with aortic atherosclerosis. Scattered areas of parenchymal scarring. Peripheral left lower lobe opacity may be related to treatment related changes, lung nodule on prior CT. Small left pleural effusion. No pulmonary edema. No pneumothorax. IMPRESSION: Peripheral left lower lobe opacity may be related to treatment related changes, lung nodule on prior CT. Small left pleural effusion. Electronically Signed   By: Andrea Gasman M.D.   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

## 2023-11-21 LAB — CBC WITH DIFFERENTIAL/PLATELET
Abs Immature Granulocytes: 0.04 K/uL (ref 0.00–0.07)
Basophils Absolute: 0 K/uL (ref 0.0–0.1)
Basophils Relative: 1 %
Eosinophils Absolute: 0.2 K/uL (ref 0.0–0.5)
Eosinophils Relative: 3 %
HCT: 24.6 % — ABNORMAL LOW (ref 36.0–46.0)
Hemoglobin: 8.2 g/dL — ABNORMAL LOW (ref 12.0–15.0)
Immature Granulocytes: 1 %
Lymphocytes Relative: 10 %
Lymphs Abs: 0.5 K/uL — ABNORMAL LOW (ref 0.7–4.0)
MCH: 28.5 pg (ref 26.0–34.0)
MCHC: 33.3 g/dL (ref 30.0–36.0)
MCV: 85.4 fL (ref 80.0–100.0)
Monocytes Absolute: 0.5 K/uL (ref 0.1–1.0)
Monocytes Relative: 9 %
Neutro Abs: 4 K/uL (ref 1.7–7.7)
Neutrophils Relative %: 76 %
Platelets: 127 K/uL — ABNORMAL LOW (ref 150–400)
RBC: 2.88 MIL/uL — ABNORMAL LOW (ref 3.87–5.11)
RDW: 17 % — ABNORMAL HIGH (ref 11.5–15.5)
WBC: 5.2 K/uL (ref 4.0–10.5)
nRBC: 0 % (ref 0.0–0.2)

## 2023-11-21 LAB — BASIC METABOLIC PANEL WITH GFR
Anion gap: 9 (ref 5–15)
BUN: 16 mg/dL (ref 8–23)
CO2: 20 mmol/L — ABNORMAL LOW (ref 22–32)
Calcium: 9.3 mg/dL (ref 8.9–10.3)
Chloride: 99 mmol/L (ref 98–111)
Creatinine, Ser: 0.94 mg/dL (ref 0.44–1.00)
GFR, Estimated: >60 mL/min (ref >60)
Glucose, Bld: 106 mg/dL — ABNORMAL HIGH (ref 70–99)
Potassium: 4.2 mmol/L (ref 3.5–5.1)
Sodium: 128 mmol/L — ABNORMAL LOW (ref 135–145)

## 2023-11-21 LAB — SODIUM, URINE, RANDOM: Sodium, Ur: 101 mmol/L

## 2023-11-21 LAB — IRON AND TIBC
Iron: 32 ug/dL (ref 28–170)
Saturation Ratios: 12 % (ref 10.4–31.8)
TIBC: 262 ug/dL (ref 250–450)
UIBC: 230 ug/dL

## 2023-11-21 LAB — URINALYSIS, W/ REFLEX TO CULTURE (INFECTION SUSPECTED)
Bilirubin Urine: NEGATIVE
Glucose, UA: NEGATIVE mg/dL
Hgb urine dipstick: NEGATIVE
Ketones, ur: NEGATIVE mg/dL
Nitrite: POSITIVE — AB
Protein, ur: NEGATIVE mg/dL
Specific Gravity, Urine: 1.013 (ref 1.005–1.030)
pH: 6 (ref 5.0–8.0)

## 2023-11-21 LAB — GLUCOSE, CAPILLARY
Glucose-Capillary: 120 mg/dL — ABNORMAL HIGH (ref 70–99)
Glucose-Capillary: 106 mg/dL — ABNORMAL HIGH (ref 70–99)
Glucose-Capillary: 127 mg/dL — ABNORMAL HIGH (ref 70–99)
Glucose-Capillary: 154 mg/dL — ABNORMAL HIGH (ref 70–99)

## 2023-11-21 LAB — FERRITIN: Ferritin: 652 ng/mL — ABNORMAL HIGH (ref 11–307)

## 2023-11-21 LAB — OCCULT BLOOD X 1 CARD TO LAB, STOOL: Fecal Occult Bld: POSITIVE — AB

## 2023-11-21 LAB — OSMOLALITY, URINE: Osmolality, Ur: 515 mosm/kg (ref 300–900)

## 2023-11-21 MED ORDER — ADULT MULTIVITAMIN LIQUID CH
15.0000 mL | Freq: Every day | ORAL | Status: DC
  Administered 2023-11-23 – 2023-12-09 (×12): 15 mL via ORAL
  Filled 2023-11-21 (×19): qty 15

## 2023-11-21 MED ORDER — OXYMETAZOLINE HCL 0.05 % NA SOLN
1.0000 | Freq: Two times a day (BID) | NASAL | Status: AC | PRN
  Administered 2023-11-22 – 2023-11-23 (×2): 1 via NASAL
  Filled 2023-11-21: qty 30

## 2023-11-21 MED ORDER — SODIUM CHLORIDE 1 G PO TABS
1.0000 g | ORAL_TABLET | Freq: Two times a day (BID) | ORAL | Status: DC
  Administered 2023-11-21 – 2023-12-09 (×32): 1 g via ORAL
  Filled 2023-11-21 (×34): qty 1

## 2023-11-21 MED ORDER — PANTOPRAZOLE SODIUM 40 MG PO TBEC
40.0000 mg | DELAYED_RELEASE_TABLET | Freq: Two times a day (BID) | ORAL | Status: DC
  Administered 2023-11-21 – 2023-12-09 (×35): 40 mg via ORAL
  Filled 2023-11-21 (×35): qty 1

## 2023-11-21 MED ORDER — SODIUM CHLORIDE 1 G PO TABS: 1.0000 g | ORAL_TABLET | Freq: Three times a day (TID) | ORAL | Status: DC

## 2023-11-21 MED ORDER — CEPHALEXIN 250 MG PO CAPS
500.0000 mg | ORAL_CAPSULE | Freq: Two times a day (BID) | ORAL | Status: AC
  Administered 2023-11-21 – 2023-11-27 (×14): 500 mg via ORAL
  Filled 2023-11-21 (×14): qty 2

## 2023-11-21 MED ORDER — BACLOFEN 5 MG HALF TABLET
5.0000 mg | ORAL_TABLET | Freq: Three times a day (TID) | ORAL | Status: DC | PRN
  Administered 2023-11-23 – 2023-12-02 (×6): 5 mg via ORAL
  Filled 2023-11-21 (×7): qty 1

## 2023-11-21 NOTE — Plan of Care (Signed)
  Problem: Consults Goal: RH STROKE PATIENT EDUCATION Description: See Patient Education module for education specifics  Outcome: Progressing   Problem: RH SAFETY Goal: RH STG ADHERE TO SAFETY PRECAUTIONS W/ASSISTANCE/DEVICE Description: STG Adhere to Safety Precautions With cues Assistance/Device. Outcome: Progressing   Problem: RH KNOWLEDGE DEFICIT Goal: RH STG INCREASE KNOWLEDGE OF DIABETES Description: Patient and dtr will be able to manage DM using educational resources for medications and dietary modification independently Outcome: Progressing Goal: RH STG INCREASE KNOWLEDGE OF HYPERTENSION Description: Patient and dtr will be able to manage HTN using educational resources for medications and dietary modification independently Outcome: Progressing Goal: RH STG INCREASE KNOWLEGDE OF HYPERLIPIDEMIA Description: Patient and dtr will be able to manage HLD using educational resources for medications and dietary modification independently Outcome: Progressing Goal: RH STG 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 Description: Patient and dtr will be able to manage secondary risks using educational resources for medications and dietary modification independently Outcome: Progressing   Problem: Education: Goal: Ability to describe self-care measures that may prevent or decrease complications (Diabetes Survival Skills Education) will improve Outcome: Progressing Goal: Individualized Educational Video(s) Outcome: Progressing   Problem: Coping: Goal: Ability to adjust to condition or change in health will improve Outcome: Progressing   Problem: Fluid Volume: Goal: Ability to maintain a balanced intake and output will improve Outcome: Progressing   Problem: Health Behavior/Discharge Planning: Goal: Ability to identify and utilize available resources and services will improve Outcome: Progressing Goal: Ability to manage health-related needs will improve Outcome: Progressing    Problem: Metabolic: Goal: Ability to maintain appropriate glucose levels will improve Outcome: Progressing   Problem: Nutritional: Goal: Maintenance of adequate nutrition will improve Outcome: Progressing Goal: Progress toward achieving an optimal weight will improve Outcome: Progressing   Problem: Skin Integrity: Goal: Risk for impaired skin integrity will decrease Outcome: Progressing   Problem: Tissue Perfusion: Goal: Adequacy of tissue perfusion will improve Outcome: Progressing

## 2023-11-21 NOTE — Progress Notes (Signed)
 Called on call [address], [place] regarding 2nd nose bleed for patient. Provider notified of orders of aspirin  and eliquis . She ordered Afrin one spray each nostril q12h prn for nosebleed.

## 2023-11-21 NOTE — Plan of Care (Signed)
  Problem: Consults Goal: RH STROKE PATIENT EDUCATION Description: See Patient Education module for education specifics  Outcome: Progressing   Problem: RH SAFETY Goal: RH STG ADHERE TO SAFETY PRECAUTIONS W/ASSISTANCE/DEVICE Description: STG Adhere to Safety Precautions With cues Assistance/Device. Outcome: Progressing   Problem: Coping: Goal: Ability to adjust to condition or change in health will improve Outcome: Progressing   Problem: Nutritional: Goal: Maintenance of adequate nutrition will improve Outcome: Progressing Goal: Progress toward achieving an optimal weight will improve Outcome: Progressing   Problem: Skin Integrity: Goal: Risk for impaired skin integrity will decrease Outcome: Progressing

## 2023-11-21 NOTE — Progress Notes (Addendum)
 PROGRESS NOTE    Subjective/Complaints: No events overnight.  Vital stable.  Patient mains lethargic, but slightly more participatory yesterday per reports.  This a.m., much more awake, alert, complaining of overnight nursing being to rough with rolling and moving.  Endorses poor sleep.  AM labs with serum osmole 277, urine osmole greater than 500.  Hemoglobin down from 10-8.5 but has been getting IV fluids.  Urinalysis yesterday slightly positive    ROS: Limited due to cognitive/behavioral. Denies CP/SOB + Painful swallowing - chronic  + mild neck pain-no complaints today + Difficulty sleeping  Objective:   DG Chest 2 View Result Date: 11/19/2023 CLINICAL DATA:  Cough. EXAM: DG CHEST 2V COMPARISON:  Radiograph 10/30/2023, CT 06/19/2023 FINDINGS: Upper normal heart size. Stable mediastinal contours with aortic atherosclerosis. Scattered areas of parenchymal scarring. Peripheral left lower lobe opacity may be related to treatment related changes, lung nodule on prior CT. Small left pleural effusion. No pulmonary edema. No pneumothorax. IMPRESSION: Peripheral left lower lobe opacity may be related to treatment related changes, lung nodule on prior CT. Small left pleural effusion. Electronically Signed   By: Andrea Gasman M.D.   On: 11/19/2023 20:15   Recent Labs    11/19/23 1622 11/21/23 0613  WBC 5.1 5.2  HGB 10.1* 8.2*  HCT 32.0* 24.6*  PLT 125* 127*    Recent Labs    11/20/23 1555 11/21/23 0613  NA 128* 128*  K 4.3 4.2  CL 99 99  CO2 17* 20*  GLUCOSE 114* 106*  BUN 18 16  CREATININE 1.02* 0.94  CALCIUM  9.0 9.3     Intake/Output Summary (Last 24 hours) at 11/21/2023 0958 Last data filed at 11/21/2023 0824 Gross per 24 hour  Intake 718 ml  Output --  Net 718 ml        Physical Exam: Vital Signs Blood pressure 126/77, pulse 92, temperature 98 F (36.7 C), resp. rate 18, height 5' 6.5 (1.689  m), weight (S) 53.8 kg, SpO2 100%.   Constitutional: No distress . Vital signs reviewed.  Sitting up in bed. HEENT: NCAT, EOMI, oral membranes moist. Neck: supple Cardiovascular: RRR without murmur. No JVD    Respiratory/Chest: CTA Bilaterally without wheezes or rales. Normal effort    GI/Abdomen: BS +, non-tender, non-distended Ext: no clubbing, cyanosis, or edema Psych: flat but cooperative  Skin: Clean and intact without signs of breakdown  Neuro: Awake and alert, delayed responses.  Minimal verbal output.  Lethargic, but appropriate.  Decreased sensation light touch right lower extremity.  Decreased to light touch on RLE< but not RUE or face  RUE- tr/5 ff x1, unable to repeat; 0/5 otherwise  LUE 4+/5 throughout RLE- HF 2/5; KE/KF 2/5; DF 0/5; PF 2-/5 LLE- 4+/5 throughout  Tone: MAS 2 right shoulder, MAS 3 right elbow, MAS 0 wrists, fingers, MAS 2 knee flexors  Musculoskeletal: No significant neck tenderness or pain with flexion extension of the neck noted.  No significant periscapular muscle tenderness noted     Assessment/Plan: 1. Functional deficits which require 3+ hours per day of interdisciplinary therapy in a comprehensive inpatient rehab setting. Physiatrist is providing close team supervision and 24 hour management of active medical  problems listed below. Physiatrist and rehab team continue to assess barriers to discharge/monitor patient progress toward functional and medical goals  Care Tool:  Bathing    Body parts bathed by patient: Right arm, Left arm, Chest, Abdomen, Front perineal area   Body parts bathed by helper: Abdomen, Chest, Front perineal area, Buttocks, Right upper leg, Left upper leg, Right lower leg, Left lower leg, Face     Bathing assist Assist Level: Total Assistance - Patient < 25%     Upper Body Dressing/Undressing Upper body dressing   What is the patient wearing?: Hospital gown only, Pull over shirt    Upper body assist Assist Level:  Total Assistance - Patient < 25%    Lower Body Dressing/Undressing Lower body dressing      What is the patient wearing?: Underwear/pull up     Lower body assist Assist for lower body dressing: Total Assistance - Patient < 25%     Toileting Toileting    Toileting assist Assist for toileting: Total Assistance - Patient < 25%     Transfers Chair/bed transfer  Transfers assist  Chair/bed transfer activity did not occur: Safety/medical concerns (fatigue)  Chair/bed transfer assist level: Maximal Assistance - Patient 25 - 49%     Locomotion Ambulation   Ambulation assist   Ambulation activity did not occur: Safety/medical concerns          Walk 10 feet activity   Assist  Walk 10 feet activity did not occur: Safety/medical concerns        Walk 50 feet activity   Assist Walk 50 feet with 2 turns activity did not occur: Safety/medical concerns         Walk 150 feet activity   Assist Walk 150 feet activity did not occur: Safety/medical concerns         Walk 10 feet on uneven surface  activity   Assist Walk 10 feet on uneven surfaces activity did not occur: Safety/medical concerns         Wheelchair     Assist Is the patient using a wheelchair?: Yes Type of Wheelchair: Manual (TIS)    Wheelchair assist level: Dependent - Patient 0%      Wheelchair 50 feet with 2 turns activity    Assist        Assist Level: Dependent - Patient 0%   Wheelchair 150 feet activity     Assist      Assist Level: Dependent - Patient 0%   Blood pressure 126/77, pulse 92, temperature 98 F (36.7 C), resp. rate 18, height 5' 6.5 (1.689 m), weight (S) 53.8 kg, SpO2 100%.  Medical Problem List and Plan: 1. Functional deficits secondary to  bilateral embolic shower, embolic pattern, etiology:  Afib and missed Eliquis  doses vs from NSTEMI with cardiac cath.              -patient may  shower             -ELOS/Goals: 12-15 days min A-               Cannot sit upright without propping            -Continue CIR therapies including PT, OT, and SLP   - Ordered PRAFO/WHO right upper extremity, sling for right upper extremity also ordered  -Will plan for palliative consult for goals of care - notified daughter and pt, pt seen yesterday, appreciate assistance  - Recheck CBC and BMP today due to reports of fatigue but  overall appears around her baseline, Check CXR- later in afternoon pt reported to nursing/therapy she had cough in AM that resolved, per therapy patient reporting she feels she may have pneumonia - says she's had it before and feels like it's the same , Will add U/A  -possibly mood related?, she had recent goals of care discussion   10-25: Chest x-ray unchanged, increasingly hyponatremic.  Increase IV fluids to 75 cc/h, urinalysis pending.  10-26: Lethargy improving, more engaged today.  2.  Antithrombotics: -DVT/anticoagulation:  Pharmaceutical: Eliquis  Transitioned from IV Heparin  to Eliquis  10/14             -antiplatelet therapy: Asprin 81 mg  3. Pain Management: Gabapentin  and Tylenol  as needed  -Voltaren  R shoulder for R shoulder pain  - 10/17 patient now reporting some pain in her left shoulder, suspect from increased use with therapy.  Right shoulder doing better.  Will try Voltaren  gel to left shoulder, consider imaging if does not improve  10/18 added kpad for neck/shoulder as well  -10/22-23 Continue voltaren  gel for occasional shoulder pain  - Can try K-pad for neck as she has some soreness this morning  10-25/25 no complaints of pain  4. Mood/Behavior/Sleep: LCSW to follow for evaluation and support when available.              -antipsychotic agents: N/A 5. Neuropsych/cognition: This patient is not quite capable of making decisions on her own behalf. 6. Skin/Wound Care: routine pressure relief measures.  7. Fluids/Electrolytes/Nutrition: monitor intake and output. Follow up labs in a.m.              - SLP  consult: Carb modified diet + Ensure  -nutrition consult for decreased p.o. intake  10/18 intake remains minimal. RD following   -added megace for appetitte  10/19 obsv today. Recheck labs, prealbumin in AM   10/20 Prealbumin 21 this AM, continue to monitor PO intake  10/22 Hx of pain with swallowing, appears has been chronic for several, nursing reports daughter said yesterday she was seen by GI in the past. Started PPI  10/23 called GI for consult- she has been seen by [facility] GI outpatient  10/24 Seen by GI appreciate- Barium swallow, Sucralfate   She ate 40-50% meals yesterday, poor intake but a little better  10-25: Eating 25 to 50% of meals  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 Continue aspirin  and statin.   9. CAD: 10/4-10/7 admitted for NSTEMI  10. HLD: Lipitor 40 mg  11. HTN: monitor blood pressures on  Hyzaar, Cardizem , and Aldactone      11/21/2023    6:00 AM 11/20/2023   11:13 PM 11/20/2023    3:24 PM  Vitals with BMI  Systolic 126 121 881  Diastolic 77 51 51  Pulse 92 89 79  - Blood pressure is well-controlled on current regimen  12. Paroxymal Atrial Flutter/Fib: continue Eliquis  2.5 bid  13. T2DM: HgbA1c 6.0, resumed home Amaryl   - monitor cbg AC/HS with SSI.  CBG (last 3)  Recent Labs    11/20/23 1626 11/20/23 2134 11/21/23 0859  GLUCAP 127* 122* 120*  - 10/15 Will order CBG/SSI  - CBGs well-controlled, holding amaryl . But not eating much  -10/20-25 controlled, continue current regimen, continued poor PO intake  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  Hypothyroidism: Synthroid  17.  Chronic anemia: Stable, monitor CBC.   -10/17 Hgb improved to 10  yesterday  -10/18 lower HGB at 8.4 suspect dilutional due to IVF, recheck tomorrow  - 10-26: Hemoglobin from 10-8.2.  May be dilutional due to IV fluids, will get iron studies and FOBT to evaluate --FOBT positive,  hesitant to hold [facility] given recent embolic strokes, will increase GI prophylaxis to 40 mg twice daily and defer AC discussion to primary team.  Trend hemoglobin.  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 Constipation- will order Milk of Mg per pt request and Miralax  daily  -LBM 10/16 continue to monitor  10/19 needs to eat!!--will give 30cc sorbitol today  10/24 LBM today  20.  Hyponatremia/SIADH  - 10/16 sodium 130, sodium appears chronically low going back several years.  Continue to monitor  -10/20 NA 131 today, continue to monitor   -10/23 NA stable 131  10-25: Sodium 128 yesterday, repeat today similar.  Serum osmole's within normal limits, increasing IV fluids from 50 to 75 cc/h overnight and repeat labs in AM.  Placed 1200 cc p.o. fluid restriction.  Urine studies pending.  10-26: Sodium remains 128 despite IV fluids; urine/serum osmole's indicative of SIADH. ?  Due to lung pathology, DC trazodone , DC IV fluids, start salt tabs 1 g 2 times daily and repeat labs in AM.  21. Azotemia  -10/20 IVF NS 50ml/hr  -10/23 BUN and CR improved, continue IVF at night for poor oral intake, can hold if drinks 1L  10-25: Creatinine increased to 1.02; increasing IV fluids as above  10-26: Creatinine 0.94, DC IV fluids due to SIADH as above.  22.  Urinary incontinence/UTI  - 10-25 urinalysis mildly positive; start Keflex  500 mg twice daily for 7 days; follow sensitivities  23.  Right hemiparesis/spasticity  - 10-26: Due to cognitive issues and lethargy, hesitant to schedule muscle relaxers.  Start baclofen 5 mg 3 times daily as needed.  LOS: 12 days A FACE TO FACE EVALUATION WAS PERFORMED  [name] 11/21/2023, 9:58 AM

## 2023-11-21 NOTE — Progress Notes (Signed)
 Around 1000 this morning in room having a tearful episode. Stayed with patient provided emotional support prior to giving medication. After episode finished talked about what made her become tearful. Difficult to understand at that time. Around noon her speech did improve. With this event expressed increase pain and headache cause of tearfulness. Was given PRN pain medication. No pain reported around noon time this afternoon.

## 2023-11-22 ENCOUNTER — Telehealth: Payer: Self-pay | Admitting: Radiation Oncology

## 2023-11-22 ENCOUNTER — Ambulatory Visit: Payer: Self-pay | Admitting: Radiation Oncology

## 2023-11-22 ENCOUNTER — Inpatient Hospital Stay (HOSPITAL_COMMUNITY)

## 2023-11-22 LAB — CBC
HCT: 24 % — ABNORMAL LOW (ref 36.0–46.0)
Hemoglobin: 8.1 g/dL — ABNORMAL LOW (ref 12.0–15.0)
MCH: 28.9 pg (ref 26.0–34.0)
MCHC: 33.8 g/dL (ref 30.0–36.0)
MCV: 85.7 fL (ref 80.0–100.0)
Platelets: 131 K/uL — ABNORMAL LOW (ref 150–400)
RBC: 2.8 MIL/uL — ABNORMAL LOW (ref 3.87–5.11)
RDW: 17.1 % — ABNORMAL HIGH (ref 11.5–15.5)
WBC: 5.3 K/uL (ref 4.0–10.5)
nRBC: 0 % (ref 0.0–0.2)

## 2023-11-22 LAB — GLUCOSE, CAPILLARY
Glucose-Capillary: 134 mg/dL — ABNORMAL HIGH (ref 70–99)
Glucose-Capillary: 153 mg/dL — ABNORMAL HIGH (ref 70–99)
Glucose-Capillary: 119 mg/dL — ABNORMAL HIGH (ref 70–99)
Glucose-Capillary: 164 mg/dL — ABNORMAL HIGH (ref 70–99)

## 2023-11-22 LAB — BASIC METABOLIC PANEL WITH GFR
Anion gap: 9 (ref 5–15)
BUN: 21 mg/dL (ref 8–23)
CO2: 21 mmol/L — ABNORMAL LOW (ref 22–32)
Calcium: 9.3 mg/dL (ref 8.9–10.3)
Chloride: 100 mmol/L (ref 98–111)
Creatinine, Ser: 1.03 mg/dL — ABNORMAL HIGH (ref 0.44–1.00)
GFR, Estimated: 55 mL/min — ABNORMAL LOW (ref >60)
Glucose, Bld: 156 mg/dL — ABNORMAL HIGH (ref 70–99)
Potassium: 4.6 mmol/L (ref 3.5–5.1)
Sodium: 130 mmol/L — ABNORMAL LOW (ref 135–145)

## 2023-11-22 NOTE — Progress Notes (Signed)
 Patient ID: [name], female   DOB: 1940/03/28, 83 y.o.   MRN: [id number]  CAP-DA referral sent to [facility] at [email or web address]

## 2023-11-22 NOTE — Progress Notes (Signed)
 Daily Progress Note   Patient Name: [name]       Date: 11/22/2023 DOB: 05-30-1940  Age: 83 y.o. MRN#: [id number] Attending Physician: Urbano Albright, MD Primary Care Physician: [name], MD Admit Date: 11/09/2023  Reason for Consultation/Follow-up: Establishing goals of care  Subjective: Medical records reviewed including progress notes, labs and imaging.  Attempted to see patient at bedside but she was not present.  Called patient's daughter [name].  Discussed coordinating goals of care discussion as previously mentioned.  She is not available today, the most days will visit around 2 or 3 PM.  Provided with PMT contact information and offered to reach out again tomorrow to see if it would be a better time.  Patient assessed at the bedside.  She is working with a paramedic.  She tells me that she has further considered her goals of care and care preferences over the weekend, maybe a little bit too much.  I offered to assist with any conversations that might be helpful for today and she preferred to wait until another day, especially given that her daughter will not be available.  Questions and concerns addressed. PMT will continue to support holistically.   Length of Stay: 13   Physical Exam Vitals and nursing note reviewed.  Constitutional:      General: She is not in acute distress. HENT:     Head: Normocephalic and atraumatic.  Cardiovascular:     Rate and Rhythm: Normal rate.  Pulmonary:     Effort: Pulmonary effort is normal.  Neurological:     Mental Status: She is alert.  Psychiatric:        Mood and Affect: Mood normal.        Behavior: Behavior normal.            Vital Signs: BP (!) 117/45 (BP Location: Right Arm)   Pulse 72   Temp 98 F (36.7 C)   Resp 17   Ht 5' 6.5 (1.689 m)   Wt (S) 53.8 kg  Comment: 5.7 pounds loss in weight since last weight checked  SpO2 100%   BMI 18.86 kg/m  SpO2: SpO2: 100 % O2 Device: O2 Device: Room Air O2 Flow Rate:     Palliative Care Assessment & Plan   Patient Profile: 83 y.o. female  with past medical history of A.fib on Eliquis , recent dx of adenocarcinoma of lung undergoing radiation treatment, hx breast cancer s/p mastectomy, CAD, SLE with interstitial lung disease, hypertension, DM2, hypothyroidism, recent admission for NSTEMI with right heart cath (10/4-10/7)  admitted on 11/09/2023 with right sided weakness, slurred speech, altered  mental status.    Initial CT imaging of the head indicated a large hypodensity on the right side, suggestive of a large territory infarct. 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 There were also bilateral embolic watershed infarcts and bilateral cerebellum infarcts identified.  Patient ultimately went to CIR for rehabilitation for her functional deficits.   PMT has been consulted to assist with goals of care conversation.  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  Patient's daughter was also not available for a family meeting today, will reattempt tomorrow Psychosocial and emotional support provided PMT will continue to follow    Prognosis:  Unable to determine  Discharge Planning: To Be Determined  Care plan was discussed with Patient, patient's daughter   Mickle SHAUNNA Fell, PA-C  Palliative Medicine Team Team phone # [phone]  Thank you for allowing the Palliative Medicine Team to assist in the care of this patient. Please utilize secure chat with additional questions, if there is no response within 30 minutes please call the above phone number.  Palliative Medicine Team providers are available by phone from 7am to 7pm daily and can be reached through the team cell phone.  Should this patient require assistance outside of these hours, please call the patient's attending physician.     Time Total: 35  Visit consisted of counseling and education dealing with the complex and emotionally intense issues of symptom management and palliative care in the setting of serious and potentially life-threatening illness. Greater than 50% of this time was spent counseling and coordinating care related to the above assessment and plan.  Personally spent 35 minutes in patient care including extensive chart review (labs, imaging, progress/consult notes, vital signs), medically appropraite exam, discussed with treatment team, education to patient, family, and staff, documenting clinical information, medication review and management, coordination of care, and available advanced directive documents.

## 2023-11-22 NOTE — Progress Notes (Signed)
 After receiving prn afrin pt slept  throughout night with no further episodes of nose bleeds.

## 2023-11-22 NOTE — Progress Notes (Signed)
 Occupational Therapy Session Note  Patient Details  Name: [name] MRN: [id number] Date of Birth: 1940/02/06  Today's Date: 11/22/2023 OT Individual Time: 8982-8870 OT Individual Time Calculation (min): 72 min    Short Term Goals: Week 2:  OT Short Term Goal 1 (Week 2): patient will demonstrate toilet transfers with maxA OT Short Term Goal 2 (Week 2): patient will complete LB donning with max A of 1 OT Short Term Goal 3 (Week 2): patient be able to complete static standing in prep for ADLS with max A  Skilled Therapeutic Interventions/Progress Updates: Patient received sitting up in power w/c. Agreeable to OT intervention. Patient reports not having had the opportunity to practice maneuvering the w/c. Patient educated on the buttons for positioning, switches for on/off and speed, and the joystick control. Patient verbalized understanding and was motivated to try gaining familiarity. Patient assisted to turn on the w/c and position more upright for improved viability. Patient assisted with getting her LUE and the joystick in a comfortable position and assisted with driving out of room through doorway. Once in the open hall patient was able to maneuver the chair forward but needed constant cues to keep from driving to the right wall and to keep head up to scan for obstacles. Patient had a tendency to look at her hand on the joystick vs forward. Mod assist to keep going forward mostly straight down the hall to the therapy gym. Once in the gym, assisted patient with RUE AAROM and stretching working on inhibiting tone. Patient with activation of biceps flexion with cues and able to initiate finger flexion. Max assist to inhibit tone and perform assisted pre-reach. Concluded treatment with w/c driving back to room- continued with Max assist to maneuver down hall. Patient had trouble keeping the joystick forward vs accidentally pulling back into reverse. Patient fatigued by end of treatment and shown  how to recline to rest following treatment. Continue with skilled OT POC.      Therapy Documentation Precautions:  Precautions Precautions: Fall Recall of Precautions/Restrictions: Intact Precaution/Restrictions Comments: SBP <180, R hemiparesis Required Braces or Orthoses: Other Brace Other Brace: R LE PRAFO, R UE sling, R UE resting hand splint Restrictions Weight Bearing Restrictions Per Provider Order: No General:    Pain:0/10     Therapy/Group: Individual Therapy  [name] 11/22/2023, 12:31 PM

## 2023-11-22 NOTE — Progress Notes (Signed)
 PROGRESS NOTE    Subjective/Complaints: Alert and awake this AM. Pt with no new complaints this AM.  Reports she wished she had more company over the weekend. She is eating breakfast.    ROS: Limited due to cognitive/behavioral. Denies CP/SOB + Painful swallowing - chronic  + mild neck pain-no complaints today + Difficulty sleeping- improved   Objective:   No results found.  Recent Labs    11/21/23 0613 11/22/23 0518  WBC 5.2 5.3  HGB 8.2* 8.1*  HCT 24.6* 24.0*  PLT 127* 131*    Recent Labs    11/21/23 0613 11/22/23 0518  NA 128* 130*  K 4.2 4.6  CL 99 100  CO2 20* 21*  GLUCOSE 106* 156*  BUN 16 21  CREATININE 0.94 1.03*  CALCIUM  9.3 9.3     Intake/Output Summary (Last 24 hours) at 11/22/2023 0953 Last data filed at 11/21/2023 1223 Gross per 24 hour  Intake 118 ml  Output --  Net 118 ml        Physical Exam: Vital Signs Blood pressure (!) 117/45, pulse 72, temperature 98 F (36.7 C), resp. rate 17, height 5' 6.5 (1.689 m), weight (S) 53.8 kg, SpO2 100%.   Constitutional: No distress . Vital signs reviewed.  Sitting up in bed. Appears chronically ill but comfortable at the moment.  HEENT: NCAT, EOMI, oral membranes moist. Neck: supple Cardiovascular: RRR without murmur. No JVD    Respiratory/Chest: CTA Bilaterally without wheezes or rales. Normal effort    GI/Abdomen: BS +, non-tender, non-distended Ext: no clubbing, cyanosis, or edema Psych: flat but cooperative  Skin: Clean and intact without signs of breakdown  Neuro: Awake and alert, delayed responses.  Minimal verbal output.   Decreased sensation light touch right lower extremity.  Decreased to light touch on RLE< but not RUE or face  RUE- tr/5 ff x1, unable to repeat; 0/5 otherwise  LUE 4+/5 throughout RLE- HF 2/5; KE/KF 2/5; DF 0/5; PF 2-/5 LLE- 4+/5 throughout  Tone: MAS 2 right shoulder, MAS 3 right elbow, MAS 0 wrists,  fingers, MAS 2 knee flexors  Musculoskeletal: No significant neck tenderness or pain with flexion extension of the neck noted.  No significant periscapular muscle tenderness noted     Assessment/Plan: 1. Functional deficits which require 3+ hours per day of interdisciplinary therapy in a comprehensive inpatient rehab setting. Physiatrist is providing close team supervision and 24 hour management of active medical problems listed below. Physiatrist and rehab team continue to assess barriers to discharge/monitor patient progress toward functional and medical goals  Care Tool:  Bathing    Body parts bathed by patient: Right arm, Left arm, Chest, Abdomen, Front perineal area   Body parts bathed by helper: Abdomen, Chest, Front perineal area, Buttocks, Right upper leg, Left upper leg, Right lower leg, Left lower leg, Face     Bathing assist Assist Level: Total Assistance - Patient < 25%     Upper Body Dressing/Undressing Upper body dressing   What is the patient wearing?: Hospital gown only, Pull over shirt    Upper body assist Assist Level: Total Assistance - Patient < 25%    Lower Body Dressing/Undressing Lower body dressing  What is the patient wearing?: Underwear/pull up     Lower body assist Assist for lower body dressing: Total Assistance - Patient < 25%     Toileting Toileting    Toileting assist Assist for toileting: Total Assistance - Patient < 25%     Transfers Chair/bed transfer  Transfers assist  Chair/bed transfer activity did not occur: Safety/medical concerns (fatigue)  Chair/bed transfer assist level: Maximal Assistance - Patient 25 - 49%     Locomotion Ambulation   Ambulation assist   Ambulation activity did not occur: Safety/medical concerns          Walk 10 feet activity   Assist  Walk 10 feet activity did not occur: Safety/medical concerns        Walk 50 feet activity   Assist Walk 50 feet with 2 turns activity did not  occur: Safety/medical concerns         Walk 150 feet activity   Assist Walk 150 feet activity did not occur: Safety/medical concerns         Walk 10 feet on uneven surface  activity   Assist Walk 10 feet on uneven surfaces activity did not occur: Safety/medical concerns         Wheelchair     Assist Is the patient using a wheelchair?: Yes Type of Wheelchair: Manual (TIS)    Wheelchair assist level: Dependent - Patient 0%      Wheelchair 50 feet with 2 turns activity    Assist        Assist Level: Dependent - Patient 0%   Wheelchair 150 feet activity     Assist      Assist Level: Dependent - Patient 0%   Blood pressure (!) 117/45, pulse 72, temperature 98 F (36.7 C), resp. rate 17, height 5' 6.5 (1.689 m), weight (S) 53.8 kg, SpO2 100%.  Medical Problem List and Plan: 1. Functional deficits secondary to  bilateral embolic shower, embolic pattern, etiology:  Afib and missed Eliquis  doses vs from NSTEMI with cardiac cath.              -patient may  shower             -ELOS/Goals: 12-15 days min A-              Cannot sit upright without propping            -Continue CIR therapies including PT, OT, and SLP   - Ordered PRAFO/WHO right upper extremity, sling for right upper extremity also ordered  -Will plan for palliative consult for goals of care - notified daughter and pt, pt seen yesterday, appreciate assistance  - Recheck CBC and BMP today due to reports of fatigue but overall appears around her baseline, Check CXR- later in afternoon pt reported to nursing/therapy she had cough in AM that resolved, per therapy patient reporting she feels she may have pneumonia - says she's had it before and feels like it's the same , Will add U/A  -possibly mood related?, she had recent goals of care discussion   10-25: Chest x-ray unchanged, increasingly hyponatremic.  Increase IV fluids to 75 cc/h, urinalysis pending.  10-26: Lethargy improving, more  engaged today. 10-27 lethargy remains improved, consider SNF recommendation   2.  Antithrombotics: -DVT/anticoagulation:  Pharmaceutical: Eliquis  Transitioned from IV Heparin  to Eliquis  10/14             -antiplatelet therapy: Asprin 81 mg  3. Pain Management: Gabapentin  and Tylenol  as needed  -  Voltaren  R shoulder for R shoulder pain  - 10/17 patient now reporting some pain in her left shoulder, suspect from increased use with therapy.  Right shoulder doing better.  Will try Voltaren  gel to left shoulder, consider imaging if does not improve  10/18 added kpad for neck/shoulder as well  -10/22-23 Continue voltaren  gel for occasional shoulder pain  - Can try K-pad for neck as she has some soreness this morning  11-19-25 no complaints of pain  4. Mood/Behavior/Sleep: LCSW to follow for evaluation and support when available.              -antipsychotic agents: N/A 5. Neuropsych/cognition: This patient is not quite capable of making decisions on her own behalf. 6. Skin/Wound Care: routine pressure relief measures.  7. Fluids/Electrolytes/Nutrition: monitor intake and output. Follow up labs in a.m.              - SLP consult: Carb modified diet + Ensure  -nutrition consult for decreased p.o. intake  10/18 intake remains minimal. RD following   -added megace for appetitte  10/19 obsv today. Recheck labs, prealbumin in AM   10/20 Prealbumin 21 this AM, continue to monitor PO intake  10/22 Hx of pain with swallowing, appears has been chronic for several, nursing reports daughter said yesterday she was seen by GI in the past. Started PPI  10/23 called GI for consult- she has been seen by [facility] outpatient  10/24 Seen by GI appreciate- Barium swallow, Sucralfate   She ate 40-50% meals yesterday, poor intake but a little better  11-19-25: Eating 25 to 50% of meals  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 Continue aspirin  and statin.   9. CAD: 10/4-10/7 admitted for NSTEMI  10. HLD: Lipitor  40 mg  11. HTN: monitor blood pressures on  Hyzaar, Cardizem , and Aldactone      11/22/2023    6:28 AM 11/21/2023   10:00 PM 11/21/2023    7:38 PM  Vitals with BMI  Systolic 117 126 880  Diastolic 45 43 55  Pulse 72 89 90  - 10/27 BP stable, continue current  12. Paroxymal Atrial Flutter/Fib: continue Eliquis  2.5 bid  13. T2DM: HgbA1c 6.0, resumed home Amaryl   - monitor cbg AC/HS with SSI.  CBG (last 3)  Recent Labs    11/21/23 1624 11/21/23 2116 11/22/23 0626  GLUCAP 127* 154* 134*  - 10/15 Will order CBG/SSI  - CBGs well-controlled, holding amaryl . But not eating much  -10/20-27 controlled, continue current regimen, continued poor PO intake  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  Hypothyroidism: Synthroid  17.  Chronic anemia: Stable, monitor CBC.   -10/17 Hgb improved to 10 yesterday  -10/18 lower HGB at 8.4 suspect dilutional due to IVF, recheck tomorrow  - 10-26: Hemoglobin from 10-8.2.  May be dilutional due to IV fluids, will get iron studies and FOBT to evaluate --FOBT positive, hesitant to hold [facility] given recent embolic strokes, will increase GI prophylaxis to 40 mg twice daily and defer AC discussion to primary team.  Trend hemoglobin. -10/27 stable overall at 8.2, appears to fluctuate, continue to monitor for now for reasons as above   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 Constipation- will order Milk of Mg per pt request and Miralax  daily  -LBM 10/16 continue to monitor  10/19 needs to eat!!--will give 30cc sorbitol today  10/27 LBM yesterday  20.  Hyponatremia/SIADH  -  10/16 sodium 130, sodium appears chronically low going back several years.  Continue to monitor  -10/20 NA 131 today, continue to monitor   -10/23 NA stable 131  10-25: Sodium 128 yesterday, repeat today similar.  Serum osmole's within normal limits, increasing IV fluids from 50 to 75 cc/h overnight and repeat  labs in AM.  Placed 1200 cc p.o. fluid restriction.  Urine studies pending.  10-26: Sodium remains 128 despite IV fluids; urine/serum osmole's indicative of SIADH. ?  Due to lung pathology, DC trazodone , DC IV fluids, start salt tabs 1 g 2 times daily and repeat labs in AM.  10/27, NA up to 130, continue current   21. Azotemia  -10/20 IVF NS 50ml/hr  -10/23 BUN and CR improved, continue IVF at night for poor oral intake, can hold if drinks 1L  10-25: Creatinine increased to 1.02; increasing IV fluids as above  10-26: Creatinine 0.94, DC IV fluids due to SIADH as above.  -10/27 Cr/BUN  trending up as IVF stopped, recheck tomorrow  22.  Urinary incontinence/UTI  - 10-25 urinalysis mildly positive; start Keflex  500 mg twice daily for 7 days; follow sensitivities  23.  Right hemiparesis/spasticity  - 10-26: Due to cognitive issues and lethargy, hesitant to schedule muscle relaxers.  Start baclofen 5 mg 3 times daily as needed.  LOS: 13 days A FACE TO FACE EVALUATION WAS PERFORMED  Mclean Collier 11/22/2023, 9:53 AM

## 2023-11-22 NOTE — Telephone Encounter (Signed)
 10/27 Called and spoke to patient's daughter about r/s her appt on today due to being inpatient.  Patient r/s on 11/17, but patient cannot walk and will need assistance getting to her appt and in out of wheelchair -per Patient's daughter.  Secure chat sent to nursing, so they are aware.

## 2023-11-22 NOTE — Plan of Care (Signed)
  Problem: Consults Goal: RH STROKE PATIENT EDUCATION Description: See Patient Education module for education specifics  Outcome: Progressing   Problem: RH SAFETY Goal: RH STG ADHERE TO SAFETY PRECAUTIONS W/ASSISTANCE/DEVICE Description: STG Adhere to Safety Precautions With cues Assistance/Device. Outcome: Progressing   Problem: RH KNOWLEDGE DEFICIT Goal: RH STG INCREASE KNOWLEDGE OF DIABETES Description: Patient and dtr will be able to manage DM using educational resources for medications and dietary modification independently Outcome: Progressing Goal: RH STG INCREASE KNOWLEDGE OF HYPERTENSION Description: Patient and dtr will be able to manage HTN using educational resources for medications and dietary modification independently Outcome: Progressing Goal: RH STG INCREASE KNOWLEGDE OF HYPERLIPIDEMIA Description: Patient and dtr will be able to manage HLD using educational resources for medications and dietary modification independently Outcome: Progressing Goal: RH STG 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 Description: Patient and dtr will be able to manage secondary risks using educational resources for medications and dietary modification independently Outcome: Progressing   Problem: Education: Goal: Ability to describe self-care measures that may prevent or decrease complications (Diabetes Survival Skills Education) will improve Outcome: Progressing Goal: Individualized Educational Video(s) Outcome: Progressing   Problem: Coping: Goal: Ability to adjust to condition or change in health will improve Outcome: Progressing   Problem: Fluid Volume: Goal: Ability to maintain a balanced intake and output will improve Outcome: Progressing   Problem: Health Behavior/Discharge Planning: Goal: Ability to identify and utilize available resources and services will improve Outcome: Progressing Goal: Ability to manage health-related needs will improve Outcome: Progressing    Problem: Metabolic: Goal: Ability to maintain appropriate glucose levels will improve Outcome: Progressing   Problem: Nutritional: Goal: Maintenance of adequate nutrition will improve Outcome: Progressing Goal: Progress toward achieving an optimal weight will improve Outcome: Progressing   Problem: Skin Integrity: Goal: Risk for impaired skin integrity will decrease Outcome: Progressing   Problem: Tissue Perfusion: Goal: Adequacy of tissue perfusion will improve Outcome: Progressing

## 2023-11-22 NOTE — Progress Notes (Signed)
 Physical Therapy Session Note  Patient Details  Name: [name] MRN: [id number] Date of Birth: May 28, 1940  Today's Date: 11/22/2023 PT Individual Time: 0830-0944 PT Individual Time Calculation (min): 74 min   Short Term Goals: Week 2:  PT Short Term Goal 1 (Week 2): PT will trial lightweight manual WC and initiate WC evaluation PT Short Term Goal 2 (Week 2): Pt will perform bed to chair transfer with LRAD and mod A PT Short Term Goal 3 (Week 2): Pt will perform sit to stand with LRAD and mod A  Skilled Therapeutic Interventions/Progress Updates:      Pt supine in bed upon arrival. Pt denies pain, endorses fatigue but agreeable to therapy.   Session emphasized functional strengthening and trunk control with transfers and sitting balance as well as an introduction to a power chair as patient expressed interest in learning to use power chair. PT made adjustments to power chair to move controls to L side.   Pt performed R rolling with use of hospital bed features with max A and L rolling with total A in order for PT to donn pants. Max A for L LE and total A for R LE required for donning pants.   Pt sat to EOB with max A and verbal/tactile cueing for use of L UE to push trunk into upright seated position. Pt struggled with seated balance EOB while pt donned shirt with max-total A, requiring +2 for assist with sitting balance.   Pt performed squat pivot transfer EOB to power chair with max-total A and verbal cueing for forward trunk lean.  Pt transported dependent in power chair to main gym for time/energy conservation. Pt transferred power chair <> EOM with max-total A and verbal cueing to encourage more participation in transfers.   Pt sat EOM with and without use of L UE to maintain seated balance with close supervision, however, R trunk lean noted with fatigue requiring min/mod A and verbal cueing for pt to correct. Pt fatigue and motivation limiting rehab progress. Pt required  significant verbal cueing and encouragement to achieve and maintain upright seated posture which was very difficult for her today. Patient with great difficulty maintaining cervical and trunk extension. Pt very fatigued throughout session.   Pt transported back to room dependent in power chair for time/energy conservation. Plan to work on power chair education with patient at next session. Pt remained in power chair in tilted position with all needs within reach upon PT exiting room.  Therapy Documentation Precautions:  Precautions Precautions: Fall Recall of Precautions/Restrictions: Intact Precaution/Restrictions Comments: SBP <180, R hemiparesis Required Braces or Orthoses: Other Brace Other Brace: R LE PRAFO, R UE sling, R UE resting hand splint Restrictions Weight Bearing Restrictions Per Provider Order: No  Therapy/Group: Individual Therapy  [name], DPT 11/22/2023, 12:40 PM

## 2023-11-22 NOTE — Progress Notes (Addendum)
 Speech Language Pathology Daily Session Note  Patient Details  Name: MARESSA APOLLO MRN: [id number] Date of Birth: Mar 26, 1940  Today's Date: 11/22/2023 SLP Individual Time: 1500-1525 SLP Individual Time Calculation (min): 25 min  Short Term Goals: Week 2: SLP Short Term Goal 1 (Week 2): Patient will increase speech intelligiblity to 70% at the sentence level given mod multimodal A SLP Short Term Goal 2 (Week 2): Patient will demonstrate problem solving skills in mildly complex situations given min multimodal A SLP Short Term Goal 3 (Week 2): Patient will recall and utilize memory compensatory strategies given min multimodal A  Skilled Therapeutic Interventions:  Initial 35 minutes of session missed due to patient off the floor for esophagram.   Upon return, skilled therapy session focused on cognitive goals. Patient initially tearful re: medical situation and SLP provided encouragement as able. SLP targeted on memory and R attention goals. Patient independently shared broad activities completed in PT/OT this date including utilizing power wheelchair. SLP targeted R attention through prompting patient to interpret therapy schedule to complete memory book. Patient required total A this date to answer questions re: schedule and scan to the R. SLP attempted use of visual anchor and physical assistance to point. Patient left in bed with alarm set and call bell in reach. Continue POC.    Pain None reported  Therapy/Group: Individual Therapy  Zakkiyya Barno M.A., CCC-SLP 11/22/2023, 3:29 PM

## 2023-11-22 NOTE — [place] FL2 (Signed)
 [place]  MEDICAID FL2 LEVEL OF CARE FORM     IDENTIFICATION  Patient Name: [name] Birthdate: 1941/01/09 Sex: female Admission Date (Current Location): 11/09/2023  [name] and [place] Number:  Producer, Television/film/video and Address:  [facility], [address], [place], [place] [address]      Provider Number: [id number]  Attending Physician Name and Address:  Urbano Albright, MD  Relative Name and Phone Number:  Ramonita Rio (Daughter)  [phone]    Current Level of Care: Hospital Recommended Level of Care: Skilled Nursing Facility Prior Approval Number:    Date Approved/Denied:   PASRR Number: [id number]  Discharge Plan: SNF    Current Diagnoses: Patient Active Problem List   Diagnosis Date Noted   Protein-calorie malnutrition, severe 11/19/2023   Acute right hemiparesis (HCC) 11/06/2023   Normocytic anemia 11/06/2023   History of lung cancer 11/06/2023   Cerebellar stroke, acute (HCC) 11/06/2023   NSTEMI (non-ST elevated myocardial infarction) (HCC) 10/30/2023   Allergic rhinitis due to pollen 10/19/2023   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   Abnormal gait 10/19/2023   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   Bloating 10/19/2023   Centrilobular emphysema (HCC) 10/19/2023   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   Diabetic mononeuropathy associated with type 2 diabetes mellitus (HCC) 10/19/2023   Family history of colon cancer in father 10/19/2023   Flatulence, eructation and gas pain 10/19/2023   Gastroesophageal reflux disease 10/19/2023   History of colonic polyps 10/19/2023   High glucose level 10/19/2023   Headache disorder 10/19/2023   Hyperglycemia due to type 2 diabetes mellitus (HCC) 10/19/2023   History of endocarditis in adulthood 10/19/2023   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/19/2023   Iron deficiency anemia 10/19/2023   Parietoalveolar pneumopathy (HCC) 10/19/2023   Osteoarthritis 10/19/2023    Neuropathy 10/19/2023   Personal history of malignant neoplasm of breast 10/19/2023   Myopathy, unspecified 10/19/2023   Pure hypercholesterolemia 10/19/2023   Stage 3a chronic kidney disease (HCC) 10/19/2023   Traumatic plantar fasciitis 10/19/2023   Atherosclerosis of coronary artery 10/19/2023   CAD S/P percutaneous coronary angioplasty 10/19/2023   Chronic systolic heart failure (HCC) 10/19/2023   Type 2 diabetes mellitus with other specified complication (HCC) 10/19/2023   Adverse effect of antihyperlipidemic and antiarteriosclerotic drugs, initial encounter 10/19/2023   Adverse effect of antihyperlipidemic drug 10/19/2023   Family history of malignant neoplasm of gastrointestinal tract 10/19/2023   Shortness of breath 10/19/2023   Encounter for screening for malignant neoplasm of colon 10/19/2023   Decreased estrogen level 10/19/2023   Elevated TSH 10/19/2023   Acquired hypothyroidism 10/19/2023   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   Systemic lupus erythematosus (HCC) 10/19/2023   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) 10/19/2023   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) 10/19/2023   Malignant neoplasm of lung (HCC) 10/19/2023   Essential hypertension 10/19/2023   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) 07/22/2023   Mediastinal adenopathy 06/29/2023   Lung mass 06/20/2023   Pulmonary embolism (HCC) 06/19/2023   S/P left mastectomy 03/31/2023   Genetic testing 03/10/2023   Family history of malignant neoplasm of breast 02/26/2023   Family history of malignant neoplasm of prostate 02/26/2023   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) 02/18/2023   Long term (current) use of anticoagulants 10/13/2022   Sepsis due to pneumonia (HCC) 10/11/2022   AKI (acute kidney injury) 10/11/2022   Paroxysmal atrial flutter (HCC) 10/11/2022  Diarrhea 10/11/2022   Controlled type 2 diabetes mellitus  without complication, without long-term current use of insulin  (HCC) 10/11/2022   Mixed hyperlipidemia 10/11/2022   Atherosclerosis of aorta 10/11/2022   Diabetes mellitus type 2, noninsulin dependent (HCC) 10/11/2022   Benign hypertension 10/11/2022   Acute febrile illness 10/11/2022   Atrial flutter with rapid ventricular response (HCC) 10/11/2022   Chronic diastolic CHF (congestive heart failure) (HCC) 10/11/2022   Syncope 04/25/2022   Near syncope 02/18/2019   Hyponatremia 02/18/2019   ILD (interstitial lung disease) (HCC) 07/30/2016   Dyspnea 07/16/2016   Bibasilar crackles 07/16/2016   History of systemic lupus erythematosus (SLE) (HCC) 07/16/2016   Cough 07/16/2016   Encounter for monitoring ACE-inhibitor therapy 07/16/2016   History of asthma 07/16/2016   Hypothyroidism 01/03/2015   Primary hypertension 01/03/2015   UTI (urinary tract infection) 01/03/2015   Drug-induced hypersensitivity reaction 01/03/2015   Lupus 01/03/2015   Pyrexia 01/02/2015    Orientation RESPIRATION BLADDER Height & Weight     Self, Time, Situation, Place  Normal Continent Weight: (S) 118 lb 9.7 oz (53.8 kg) (5.7 pounds loss in weight since last weight checked) Height:  5' 6.5 (168.9 cm)  BEHAVIORAL SYMPTOMS/MOOD NEUROLOGICAL BOWEL NUTRITION STATUS      Continent Diet  AMBULATORY STATUS COMMUNICATION OF NEEDS Skin   Extensive Assist Verbally Normal                       Personal Care Assistance Level of Assistance  Total care       Total Care Assistance: Maximum assistance   Functional Limitations Info             SPECIAL CARE FACTORS FREQUENCY  PT (By licensed PT), OT (By licensed OT)     PT Frequency: 5x weekly OT Frequency: 5x weekly            Contractures Contractures Info: Not present    Additional Factors Info  Code Status Code Status Info: Full             Current Medications (11/22/2023):  This is the current hospital active medication list Current  Facility-Administered Medications  Medication Dose Route Frequency Provider Last Rate Last Admin   acetaminophen  (TYLENOL ) tablet 325-650 mg  325-650 mg Oral Q4H PRN Jerilynn Daphne SAILOR, NP   650 mg at 11/21/23 2149   albuterol  (PROVENTIL ) (2.5 MG/3ML) 0.083% nebulizer solution 2.5 mg  2.5 mg Nebulization Q6H PRN Lawrence, Brandi N, NP       alum & mag hydroxide-simeth (MAALOX/MYLANTA) 200-200-20 MG/5ML suspension 30 mL  30 mL Oral Q4H PRN Lawrence, Brandi N, NP   30 mL at 11/16/23 2055   anastrozole  (ARIMIDEX ) tablet 1 mg  1 mg Oral Daily Jerilynn Daphne SAILOR, NP   1 mg at 11/22/23 1136   apixaban  (ELIQUIS ) tablet 2.5 mg  2.5 mg Oral BID Jerilynn Daphne SAILOR, NP   2.5 mg at 11/22/23 9182   aspirin  EC tablet 81 mg  81 mg Oral Daily Jerilynn Daphne SAILOR, NP   81 mg at 11/22/23 9182   atorvastatin  (LIPITOR) tablet 40 mg  40 mg Oral Daily Lawrence, Brandi N, NP   40 mg at 11/22/23 9182   baclofen (LIORESAL) tablet 5 mg  5 mg Oral TID PRN Engler, Morgan C, DO       bisacodyl (DULCOLAX) suppository 10 mg  10 mg Rectal Daily PRN Jerilynn Daphne SAILOR, NP   10 mg at 11/15/23 1845   cephALEXin  (KEFLEX )  capsule 500 mg  500 mg Oral Q12H Emeline Search C, DO   500 mg at 11/22/23 0817   diclofenac  Sodium (VOLTAREN ) 1 % topical gel 2 g  2 g Topical QID Urbano Albright, MD   2 g at 11/22/23 1135   diltiazem  (CARDIZEM  CD) 24 hr capsule 120 mg  120 mg Oral Daily Jerilynn Daphne SAILOR, NP   120 mg at 11/22/23 1136   diphenhydrAMINE  (BENADRYL ) capsule 25 mg  25 mg Oral Q6H PRN Jerilynn Daphne SAILOR, NP       feeding supplement (ENSURE PLUS HIGH PROTEIN) liquid 237 mL  237 mL Oral BID BM Urbano Albright, MD   237 mL at 11/22/23 1134   gabapentin  (NEURONTIN ) capsule 300 mg  300 mg Oral TID Jerilynn Daphne SAILOR, NP   300 mg at 11/22/23 9182   guaiFENesin -dextromethorphan  (ROBITUSSIN DM) 100-10 MG/5ML syrup 5-10 mL  5-10 mL Oral Q6H PRN Jerilynn Daphne SAILOR, NP   10 mL at 11/10/23 2041   hydroxychloroquine  (PLAQUENIL ) tablet 200 mg   200 mg Oral Daily Jerilynn Daphne SAILOR, NP   200 mg at 11/22/23 1136   insulin  aspart (novoLOG ) injection 0-6 Units  0-6 Units Subcutaneous TID WC Urbano Albright, MD   1 Units at 11/22/23 1312   levothyroxine  (SYNTHROID ) tablet 100 mcg  100 mcg Oral Daily Lawrence, Brandi N, NP   100 mcg at 11/22/23 9441   magnesium  hydroxide (MILK OF MAGNESIA) suspension 15 mL  15 mL Oral Daily PRN Lovorn, Duwaine, MD   15 mL at 11/15/23 0539   megestrol (MEGACE) 400 MG/10ML suspension 400 mg  400 mg Oral BID Swartz, Zachary T, MD   400 mg at 11/22/23 9182   multivitamin liquid 15 mL  15 mL Oral Daily Urbano Albright, MD       oxymetazoline (AFRIN) 0.05 % nasal spray 1 spray  1 spray Each Nare BID PRN Street, Neligh, PA-C   1 spray at 11/22/23 0030   pantoprazole  (PROTONIX ) EC tablet 40 mg  40 mg Oral BID AC Engler, Morgan C, DO   40 mg at 11/22/23 0817   polyethylene glycol (MIRALAX  / GLYCOLAX ) packet 17 g  17 g Oral Daily Lovorn, Megan, MD   17 g at 11/22/23 0818   prochlorperazine (COMPAZINE) tablet 5-10 mg  5-10 mg Oral Q6H PRN Jerilynn Daphne SAILOR, NP       Or   prochlorperazine (COMPAZINE) suppository 12.5 mg  12.5 mg Rectal Q6H PRN Jerilynn Daphne SAILOR, NP       Or   prochlorperazine (COMPAZINE) injection 5-10 mg  5-10 mg Intravenous Q6H PRN Jerilynn Daphne SAILOR, NP       senna-docusate (Senokot-S) tablet 1 tablet  1 tablet Oral QHS PRN Lawrence, Brandi N, NP   1 tablet at 11/13/23 2010   sodium chloride  tablet 1 g  1 g Oral BID WC Engler, Morgan C, DO   1 g at 11/22/23 0817   sucralfate  (CARAFATE ) 1 GM/10ML suspension 1 g  1 g Oral TID WC & HS Kriss Stagger H, DO   1 g at 11/22/23 1311     Discharge Medications: Please see discharge summary for a list of discharge medications.  Relevant Imaging Results:  Relevant Lab Results:   Additional Information SSN: [id number]  [name]  [name], [place]

## 2023-11-22 NOTE — Progress Notes (Signed)
 1000 PM While washing pt arboriculturist notes a small amount of bright red blood  at the entrance to pt left nasal nares writer cleaned area. Writer turned her back to pt and was alerted by pt stating  my nose is bleeding upon turning around writer noted a steady stream of bright red blood coming from pt left  nasal nares  down to her top lip. Pt is in no distress and denies any pain or discomfort. Area cleaned and light pressure applied.Rapid response notified of change in condition at which time a team member comes to unit and  assess pt.  Writer is instructed to continue to monitor pt. Charge nurse on unit and notified.. 1055 PM Pt  request assistance stating My nose is bleeding again pt received laying in   bed  with HOB elevated a steady stream of bright red blood is noted  coming from left nasal nares. Area cleaned and light pressure applied. Charge nurse notified and on unit to assess pt. Charge nurse informs M. Street PA  of change in condition new orders received.

## 2023-11-23 ENCOUNTER — Inpatient Hospital Stay (HOSPITAL_COMMUNITY)

## 2023-11-23 DIAGNOSIS — E43 Unspecified severe protein-calorie malnutrition: Secondary | ICD-10-CM

## 2023-11-23 LAB — URINE CULTURE: Culture: >=100000 — AB

## 2023-11-23 LAB — BASIC METABOLIC PANEL WITH GFR
Anion gap: 8 (ref 5–15)
BUN: 18 mg/dL (ref 8–23)
CO2: 20 mmol/L — ABNORMAL LOW (ref 22–32)
Calcium: 9.2 mg/dL (ref 8.9–10.3)
Chloride: 102 mmol/L (ref 98–111)
Creatinine, Ser: 0.96 mg/dL (ref 0.44–1.00)
GFR, Estimated: 59 mL/min — ABNORMAL LOW (ref >60)
Glucose, Bld: 110 mg/dL — ABNORMAL HIGH (ref 70–99)
Potassium: 4.3 mmol/L (ref 3.5–5.1)
Sodium: 130 mmol/L — ABNORMAL LOW (ref 135–145)

## 2023-11-23 LAB — CBC
HCT: 24.2 % — ABNORMAL LOW (ref 36.0–46.0)
Hemoglobin: 8.2 g/dL — ABNORMAL LOW (ref 12.0–15.0)
MCH: 29 pg (ref 26.0–34.0)
MCHC: 33.9 g/dL (ref 30.0–36.0)
MCV: 85.5 fL (ref 80.0–100.0)
Platelets: 132 K/uL — ABNORMAL LOW (ref 150–400)
RBC: 2.83 MIL/uL — ABNORMAL LOW (ref 3.87–5.11)
RDW: 17.3 % — ABNORMAL HIGH (ref 11.5–15.5)
WBC: 6.5 K/uL (ref 4.0–10.5)
nRBC: 0 % (ref 0.0–0.2)

## 2023-11-23 LAB — TROPONIN I (HIGH SENSITIVITY)
Troponin I (High Sensitivity): 49 ng/L — ABNORMAL HIGH (ref <18)
Troponin I (High Sensitivity): 68 ng/L — ABNORMAL HIGH (ref <18)

## 2023-11-23 LAB — GLUCOSE, CAPILLARY
Glucose-Capillary: 114 mg/dL — ABNORMAL HIGH (ref 70–99)
Glucose-Capillary: 128 mg/dL — ABNORMAL HIGH (ref 70–99)
Glucose-Capillary: 104 mg/dL — ABNORMAL HIGH (ref 70–99)
Glucose-Capillary: 148 mg/dL — ABNORMAL HIGH (ref 70–99)

## 2023-11-23 MED ORDER — GERHARDT'S BUTT CREAM
TOPICAL_CREAM | Freq: Two times a day (BID) | CUTANEOUS | Status: DC
  Filled 2023-11-23: qty 60

## 2023-11-23 NOTE — Progress Notes (Addendum)
 Patient ID: [name], female   DOB: 1940-11-20, 83 y.o.   MRN: [id number]  Daughter is here and spoke with along with pt's son on speaker phone the amount of care pt will require at home and the need to learn it if plan is home versus SNF. Neither want her to go to a NH bur are not sure can do her care. Meeting with Palliative Care today also to go over goals of care. Pt had gotten 6 radiation treatments prior to admission but has not gone back to see if needed additional ones. Will follow up with oncology once discharged from the hospital. Family to discuss the plan and let worker know what they are able to do. Aware discharge date is 11/5. Daughter to get back with worker after discussion with family. Aware CAP and PCS will take time to get in place and will not be 24/7 care.  3:25 PM Contacted palliative care to let know daughter is here, but pt having shoulder and EKG done so will see tomorrow.

## 2023-11-23 NOTE — Progress Notes (Signed)
 Received a call from Aleck Apo RN at 19:12, reporting patients Trop was elevated: 49- 68, earlier today she reported having left shoulder pain radiating into her neck.  At this time Ms. Roane denies chest pain, neck pain and shoulder pain. The above was discussed with Dr Emeline, Dr Emeline would like cardiology to review Ms. Quant, due to her history on NSTEMI. Message sent to Dr Debera regarding the above awaiting return call.

## 2023-11-23 NOTE — Progress Notes (Addendum)
 Family reported the patient having left shoulder pain radiating to her neck. Face to face interaction with the patient. Notified NP and rapid response. New orders received.

## 2023-11-23 NOTE — Plan of Care (Addendum)
 Afrin given x 1 prn dose due to nose bleed at 0620 Problem: Consults Goal: RH STROKE PATIENT EDUCATION Description: See Patient Education module for education specifics  Outcome: Progressing   Problem: RH SAFETY Goal: RH STG ADHERE TO SAFETY PRECAUTIONS W/ASSISTANCE/DEVICE Description: STG Adhere to Safety Precautions With cues Assistance/Device. Outcome: Progressing   Problem: RH KNOWLEDGE DEFICIT Goal: RH STG INCREASE KNOWLEDGE OF DIABETES Description: Patient and dtr will be able to manage DM using educational resources for medications and dietary modification independently Outcome: Progressing Goal: RH STG INCREASE KNOWLEDGE OF HYPERTENSION Description: Patient and dtr will be able to manage HTN using educational resources for medications and dietary modification independently Outcome: Progressing Goal: RH STG INCREASE KNOWLEGDE OF HYPERLIPIDEMIA Description: Patient and dtr will be able to manage HLD using educational resources for medications and dietary modification independently Outcome: Progressing Goal: RH STG 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 Description: Patient and dtr will be able to manage secondary risks using educational resources for medications and dietary modification independently Outcome: Progressing   Problem: Education: Goal: Ability to describe self-care measures that may prevent or decrease complications (Diabetes Survival Skills Education) will improve Outcome: Progressing   Problem: Coping: Goal: Ability to adjust to condition or change in health will improve Outcome: Progressing   Problem: Fluid Volume: Goal: Ability to maintain a balanced intake and output will improve Outcome: Progressing   Problem: Health Behavior/Discharge Planning: Goal: Ability to identify and utilize available resources and services will improve Outcome: Progressing Goal: Ability to manage health-related needs will improve Outcome: Progressing   Problem:  Metabolic: Goal: Ability to maintain appropriate glucose levels will improve Outcome: Progressing   Problem: Nutritional: Goal: Maintenance of adequate nutrition will improve Outcome: Progressing Goal: Progress toward achieving an optimal weight will improve Outcome: Progressing   Problem: Skin Integrity: Goal: Risk for impaired skin integrity will decrease Outcome: Progressing   Problem: Tissue Perfusion: Goal: Adequacy of tissue perfusion will improve Outcome: Progressing

## 2023-11-23 NOTE — Progress Notes (Signed)
 Physical Therapy Session Note  Patient Details  Name: [name] MRN: [id number] Date of Birth: [birth year]  Today's Date: 11/23/2023 PT Individual Time: 0830-0900 PT Individual Time Calculation (min): 30 min  and Today's Date: 11/23/2023 PT Missed Time: 15 Minutes Missed Time Reason: Other (Comment) (pt eating breakfast)  Short Term Goals: Week 2:  PT Short Term Goal 1 (Week 2): PT will trial lightweight manual WC and initiate WC evaluation PT Short Term Goal 1 - Progress (Week 2): Progressing toward goal PT Short Term Goal 2 (Week 2): Pt will perform bed to chair transfer with LRAD and mod A PT Short Term Goal 2 - Progress (Week 2): Revised due to lack of progress PT Short Term Goal 3 (Week 2): Pt will perform sit to stand with LRAD and mod A PT Short Term Goal 3 - Progress (Week 2): Revised due to lack of progress  Skilled Therapeutic Interventions/Progress Updates:      Pt supine in bed upon arrival eating breakfast with tech. Provided pt with time to finish eating as she has not been eating much. Pt agreeable to therapy. Pt reported pain in her buttock/low back with HOB elevated - when lowered, pt reported reduced pain. Rest breaks and repositioning provided throughout.  Session emphasized activity tolerance and functional strength and endurance with bed mobility and during transfers.  Pt able to lift L LE to assist PT with threading pants and required mod/max A to lift R LE to assist PT with threading pants. Pt able to achieve hooklying position with L LE and required mod/max A for R LE - requiring max A to maintain R LE position while pt attempted to pull pants up with L UE - pt not able to pull pants up very far. In order for PT to pull pants up, pt performed R/L rolling with max A (max-total A for L) and verbal cueing to reach for bed rail with L UE (mod A required for reach). Pt performed supine to sit transfer to EOB with max A and verbal cueing for LEs (R>L) and to push  through L UE to bring trunk to upright position. Pt performed squat pivot transfer EOB <> power chair with max-total A and verbal cueing for L UE placement. Once seated in power chair, pt complained of increased pain in her buttock where they bathed her the other day. Pt transferred back to bed to assess skin integrity due to complaint of pain. Pt performed rolling toward R - 3 small skin tears noted in gluteal crease - PT applied barrier cream and sacral foam dressing to prevent further skin breakdown (nursing notified) - MD present for skin assessment. Pt positioned for comfort, bed alarm set, and all needs within reach.  Pt missed 15 min treatment time due to eating breakfast. Plan to make up time as able.  Therapy Documentation Precautions:  Precautions Precautions: Fall Recall of Precautions/Restrictions: Intact Precaution/Restrictions Comments: SBP <180, R hemiparesis Required Braces or Orthoses: Other Brace Other Brace: R LE PRAFO, R UE sling, R UE resting hand splint Restrictions Weight Bearing Restrictions Per Provider Order: No  Therapy/Group: Individual Therapy  [name] [name] Kathryn, DPT 11/23/2023, 10:03 AM

## 2023-11-23 NOTE — Procedures (Signed)
 Modified Barium Swallow Study  Patient Details  Name: [name] MRN: [id number] Date of Birth: 07-Nov-1940  Today's Date: 11/23/2023  Modified Barium Swallow completed.  Full report located under Chart Review in the Imaging Section.  History of Present Illness [name] is a 83 y.o. 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       CT of the head revealed large hypodensity on the right concerning for large territory infarct.  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  Stroke consistent with embolic.  Patient apparently missed Eliquis  doses. Echo shows EF 60%, increased RVSP at 36.9 mmHg, mild MR, moderate AR. Continue aspirin , statin.  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 S/p radiation, on anastrozole .She is at risk for strokes due to her cancer history. Therapies are recommending intensive rehab.   Clinical Impression Patient presents with a functional oropharyngeal swallow. Oral phase is remarkable for mild oral residuals and mildly prolonged mastication times (patient with missing dentition) however patient is able to clear w/ additional swallows or liquid wash. Pharyngeal phase is remarkable for complete epiglottic inversion, laryngeal vestibular closure and hyoid excursion. Noted x1 instance of trace penetration, however no other instances of penetration/aspiration across all consistencies tested. Full clearance of pharyngeal space. Patient unable to transit pill from oral cavity, therefore expectorated. Recommend continuation of D3/thin  liquid diet with medications crushed in puree. Patient should receive physical and verbal assistance during meals to cue for head up posture.   Swallow Evaluation Recommendations Recommendations: PO diet PO Diet Recommendation: Dysphagia 3 (Mechanical soft);Thin liquids (Level 0) Liquid Administration via: Straw Medication Administration: Crushed with puree Supervision: Full assist for feeding Swallowing strategies  : Slow rate;Small bites/sips Postural changes: Position pt fully upright for meals Oral care recommendations: Oral care BID (2x/day)   Cayle Cordoba M.A., CCC-SLP 11/23/2023,10:18 AM

## 2023-11-23 NOTE — Progress Notes (Signed)
 Physical Therapy Weekly Progress Note  Patient Details  Name: [name] MRN: [id number] Date of Birth: 07-20-1940  Beginning of progress report period: November 16, 2023 End of progress report period: November 23, 2023  Patient has met 0 of 3 short term goals for week 2. Pt's overall level of assist continues to fluctuate based on level of fatigue and attention. However, overall pt has required max-total A for bed mobility, transfers, sitting balance, and standing within the past week. Pt's overall progress toward goals limited by weakness, dense R hemiparesis, poor postural control, R UE/LE inattention, significant fatigue, and motivation. Another family education planned for Thursday and [facility] evaluation on Friday.  Patient continues to demonstrate the following deficits muscle weakness and muscle joint tightness, decreased cardiorespiratoy endurance, impaired timing and sequencing, abnormal tone, unbalanced muscle activation, decreased coordination, and decreased motor planning, decreased midline orientation, decreased attention to right, right side neglect, and decreased motor planning, decreased initiation, decreased attention, decreased awareness, decreased problem solving, and delayed processing, and decreased sitting balance, decreased standing balance, decreased postural control, hemiplegia, and decreased balance strategies and therefore will continue to benefit from skilled PT intervention to increase functional independence with mobility.  Patient not progressing toward long term goals.  See goal revision..  {plan of [id number]}  PT Short Term Goals Week 2:  PT Short Term Goal 1 (Week 2): PT will trial lightweight manual WC and initiate WC evaluation PT Short Term Goal 1 - Progress (Week 2): Progressing toward goal PT Short Term Goal 2 (Week 2): Pt will perform bed to chair transfer with LRAD and mod A PT Short Term Goal 2 - Progress (Week 2): Revised due to lack of progress PT Short  Term Goal 3 (Week 2): Pt will perform sit to stand with LRAD and mod A PT Short Term Goal 3 - Progress (Week 2): Revised due to lack of progress Week 3:  PT Short Term Goal 1 (Week 3): STG=LTG due to ELOS  Skilled Therapeutic Interventions/Progress Updates:      Therapy Documentation Precautions:  Precautions Precautions: Fall Recall of Precautions/Restrictions: Intact Precaution/Restrictions Comments: SBP <180, R hemiparesis Required Braces or Orthoses: Other Brace Other Brace: R LE PRAFO, R UE sling, R UE resting hand splint Restrictions Weight Bearing Restrictions Per Provider Order: No  Therapy/Group: Individual Therapy  [name], DPT 11/23/2023, 8:24 AM

## 2023-11-23 NOTE — Progress Notes (Signed)
 Spoke with Dr Gail, he reviewed Ms. Kronick EKG, no new orders. We will continue to monitor. Nurse is aware.

## 2023-11-23 NOTE — Plan of Care (Signed)
  Problem: RH Functional Use of Upper Extremity Goal: LTG Patient will use RT/LT upper extremity as a (OT) Description: LTG: Patient will use right/left upper extremity as a stabilizer/gross assist/diminished/nondominant/dominant level with assist, with/without cues during functional activity (OT) Outcome: Not Progressing   Problem: RH Balance Goal: LTG: Patient will maintain dynamic sitting balance (OT) Description: LTG:  Patient will maintain dynamic sitting balance with assistance during activities of daily living (OT) Flowsheets (Taken 11/23/2023 1358) LTG: Pt will maintain dynamic sitting balance during ADLs with: Minimal Assistance - Patient > 75% Note: Downgraded due to performance    Problem: Sit to Stand Goal: LTG:  Patient will perform sit to stand in prep for activites of daily living with assistance level (OT) Description: LTG:  Patient will perform sit to stand in prep for activites of daily living with assistance level (OT) Flowsheets (Taken 11/23/2023 1358) LTG: PT will perform sit to stand in prep for activites of daily living with assistance level: Moderate Assistance - Patient 50 - 74%   Problem: RH Eating Goal: LTG Patient will perform eating w/assist, cues/equip (OT) Description: LTG: Patient will perform eating with assist, with/without cues using equipment (OT) Flowsheets (Taken 11/23/2023 1358) LTG: Pt will perform eating with assistance level of: Set up assist    Problem: RH Grooming Goal: LTG Patient will perform grooming w/assist,cues/equip (OT) Description: LTG: Patient will perform grooming with assist, with/without cues using equipment (OT) Flowsheets (Taken 11/23/2023 1358) LTG: Pt will perform grooming with assistance level of: Supervision/Verbal cueing Note: Downgraded due to decreased performance

## 2023-11-23 NOTE — Progress Notes (Signed)
 Nutrition Follow-up  DOCUMENTATION CODES:   Severe malnutrition in context of chronic illness  INTERVENTION:  Ensure Plus High Protein po BID, each supplement provides 350 kcal and 20 grams of protein.  Continue feeding assistance and using dentures at meals  Encourage adequate intake that meets calorie and protein needs  Magic cup TID with meals, each supplement provides 290 kcal and 9 grams of protein  NUTRITION DIAGNOSIS:   Severe Malnutrition related to chronic illness as evidenced by severe fat depletion, severe muscle depletion, percent weight loss, energy intake < or equal to 75% for > or equal to 1 month.  Remains applicable  GOAL:   Patient will meet greater than or equal to 90% of their needs, Weight gain  Progressing  MONITOR:   PO intake, Supplement acceptance, Weight trends  REASON FOR ASSESSMENT:   Consult Poor PO  ASSESSMENT:   PMH significant for DM2, recent dx lung CA undergoing radiation, breast CA s/p mastectomy, recent NSTEMI admission 10/4-10/7, Afib, CAD, HTN, interstitial lung disease, hypothyroidism. 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  Spoke with pt who was resting in bed at time of assessment. Pt seemed fatigued. Pt reports she has been trying to eat more but she still has a poor appetite. RN student reports pt did well with breakfast this morning and has been drinking the Ensure shakes. Added magic cups to trays to promote increased calorie and protein intake. Encouraged pt to eat small amounts throughout the day and continue oral supplements to help meet needs. Pt's diet now advanced to DYS 3, discussed ordering any food that sounds appetizing to help intake.  No GI discomforts to report at this time. Sodium labs low, supplementing with sodium chloride  tablets. Sodium likely low due to poor intake and pt now on a fluid restriction to help. Will continue to  monitor.  Average Meal Completion:  10/25-10/28 30% average intake x 8 recorded meals  Medications: Ensure Plus BID SSI 0-6 units TID Levothyroxine  Megace BID Liquid MVI Miralax  Sodium chloride  tablet BID  Labs: CBG x 24 h: 114-164 mg/dL Sodium 869  Diet Order:   Diet Order             DIET DYS 3 Room service appropriate? Yes; Fluid consistency: Thin; Fluid restriction: 1200 mL Fluid  Diet effective now                   EDUCATION NEEDS:   Education needs have been addressed  Skin:  Skin Assessment: Reviewed RN Assessment  Last BM:  10/27 type 6  Height:   Ht Readings from Last 1 Encounters:  11/09/23 5' 6.5 (1.689 m)    Weight:   Wt Readings from Last 1 Encounters:  11/19/23 (S) 53.8 kg    Ideal Body Weight:  61.4 kg  BMI:  Body mass index is 18.86 kg/m.  Estimated Nutritional Needs:   Kcal:  1700-1900 kcal/d  Protein:  80-100g/d  Fluid:  >/=1.7L/d    Josette Glance, MS, RDN, LDN Clinical Dietitian I Please reach out via secure chat

## 2023-11-23 NOTE — Progress Notes (Signed)
 Daily Progress Note   Patient Name: [name]       Date: 11/23/2023 DOB: January 20, 1941  Age: 83 y.o. MRN#: [id number] Attending Physician: Urbano Albright, MD Primary Care Physician: [name], MD Admit Date: 11/09/2023  Reason for Consultation/Follow-up: Establishing goals of care  Subjective: Medical records reviewed including progress notes, labs and imaging. Called patient's daughter [name].  Discussed coordinating goals of care discussion as previously mentioned.  She will visit around 2 or 3 PM today.    Patient assessed at the bedside at 3:07pm. Daughter and great-great grandson present visiting. Unfortunately, RN was calling rehab floor PA due to 10/10 shoulder pain radiating to her neck, similarly to how she presented for NSTEMI. Daughter agreed it is too difficult for patient to participate in GOC discussion at this time. I offered patient opportunity to share any thoughts before I left but she was unable to respond due to her pain. Confirmed with daughter that a meeting tomorrow at the same time would work for her. Emotional support and therapeutic listening was provided as workup began.   Questions and concerns addressed. PMT will continue to support holistically.   Length of Stay: 14   Physical Exam Vitals and nursing note reviewed.  Constitutional:      General: She is in acute distress.     Appearance: She is ill-appearing.     Comments: grimacing  HENT:     Head: Normocephalic and atraumatic.  Cardiovascular:     Rate and Rhythm: Normal rate.  Pulmonary:     Effort: Pulmonary effort is normal.  Neurological:     Mental Status: She is alert.  Psychiatric:        Mood and Affect: Mood normal.        Behavior: Behavior normal.            Vital Signs: BP 119/88 (BP Location: Right Arm)   Pulse 73   Temp  98.9 F (37.2 C)   Resp 20   Ht 5' 6.5 (1.689 m)   Wt (S) 53.8 kg Comment: 5.7 pounds loss in weight since last weight checked  SpO2 100%   BMI 18.86 kg/m  SpO2: SpO2: 100 % O2 Device: O2 Device: Room Air O2 Flow Rate:     Palliative Care Assessment & Plan   Patient Profile: 83 y.o. female  with past medical history of A.fib on Eliquis , recent dx of adenocarcinoma of lung undergoing radiation treatment, hx breast cancer s/p mastectomy, CAD, SLE with interstitial lung disease, hypertension, DM2, hypothyroidism, recent admission for NSTEMI with right heart cath (10/4-10/7)  admitted on 11/09/2023 with right  sided weakness, slurred speech, altered mental status.    Initial CT imaging of the head indicated a large hypodensity on the right side, suggestive of a large territory infarct. 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 There were also bilateral embolic watershed infarcts and bilateral cerebellum infarcts identified.  Patient ultimately went to CIR for rehabilitation for her functional deficits.   PMT has been consulted to assist with goals of care conversation.  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 Deferred until tomorrow at 2:15pm Psychosocial and emotional support provided PMT will continue to follow    Prognosis:  Unable to determine  Discharge Planning: To Be Determined  Care  plan was discussed with Patient, patient's daughter, RN   Thien Berka SHAUNNA Fell, PA-C  Palliative Medicine Team Team phone # [phone]  Thank you for allowing the Palliative Medicine Team to assist in the care of this patient. Please utilize secure chat with additional questions, if there is no response within 30 minutes please call the above phone number.  Palliative Medicine Team providers are available by phone from 7am to 7pm daily and can be reached through the team cell phone.  Should this patient require assistance outside of these hours, please call the patient's attending physician.     Time Total: 35  Visit consisted of counseling and education dealing with the complex and emotionally intense issues of symptom management and palliative care in the setting of serious and potentially life-threatening illness. Greater than 50% of this time was spent counseling and coordinating care related to the above assessment and plan.  Personally spent 35 minutes in patient care including extensive chart review (labs, imaging, progress/consult notes, vital signs), medically appropraite exam, discussed with treatment team, education to patient, family, and staff, documenting clinical information, medication review and management, coordination of care, and available advanced directive documents.

## 2023-11-23 NOTE — Progress Notes (Signed)
 Speech Language Pathology Daily Session Note  Patient Details  Name: [name] MRN: [id number] Date of Birth: [birth year]  Today's Date: 11/23/2023 SLP Individual Time: 1355-1440 SLP Individual Time Calculation (min): 45 min  Short Term Goals: Week 2: SLP Short Term Goal 1 (Week 2): Patient will increase speech intelligiblity to 70% at the sentence level given mod multimodal A SLP Short Term Goal 2 (Week 2): Patient will demonstrate problem solving skills in mildly complex situations given min multimodal A SLP Short Term Goal 3 (Week 2): Patient will recall and utilize memory compensatory strategies given min multimodal A  Skilled Therapeutic Interventions: Skilled therapy session focused on cognitive goals and family education. SLP facilitated session by prompting patient to recall activities completed in prior sessions. Patient recalled she completed transfers from bed to [facility], however unable to provide further information. Patient verbalized episode of incontinence and SLP/NT transferred patient to bed for peri-care. Upon completion of peri-care, SLP targeted R attention. SLP placed letters across table span for patient to sequence. Patient with improved R attention compared to prior sessions, requiring modA to locate letters. At the end of the session, SLP provided education to family regarding R attention and MBS completed this AM. SLP encouraged patients family to sit on the R side of patient when visiting. Patient left in bed with alarm set and call bell in reach. Family present. Continue POC.    Pain Shoulder pain- patient repositioned   Therapy/Group: Individual Therapy  [name] M.A., CCC-SLP 11/23/2023, 7:36 AM

## 2023-11-23 NOTE — NC FL2 (Deleted)
 [place]  MEDICAID FL2 LEVEL OF CARE FORM     IDENTIFICATION  Patient Name: [name] Birthdate: [birth year] Sex: female Admission Date (Current Location): 11/09/2023  [facility] and [place] Number:  Producer, Television/film/video and Address:  The . [facility], [address] [place], [place] [address]      Provider Number: [id number]  Attending Physician Name and Address:  Urbano Albright, MD  Relative Name and Phone Number:  Ramonita Rio (Daughter)  [phone]    Current Level of Care: Hospital Recommended Level of Care: Skilled Nursing Facility Prior Approval Number:    Date Approved/Denied:   PASRR Number: [id number]  Discharge Plan: SNF    Current Diagnoses: Patient Active Problem List   Diagnosis Date Noted   Protein-calorie malnutrition, severe 11/19/2023   Acute right hemiparesis (HCC) 11/06/2023   Normocytic anemia 11/06/2023   History of lung cancer 11/06/2023   Cerebellar stroke, acute (HCC) 11/06/2023   NSTEMI (non-ST elevated myocardial infarction) (HCC) 10/30/2023   Allergic rhinitis due to pollen 10/19/2023   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   Abnormal gait 10/19/2023   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   Bloating 10/19/2023   Centrilobular emphysema (HCC) 10/19/2023   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   Diabetic mononeuropathy associated with type 2 diabetes mellitus (HCC) 10/19/2023   Family history of colon cancer in father 10/19/2023   Flatulence, eructation and gas pain 10/19/2023   Gastroesophageal reflux disease 10/19/2023   History of colonic polyps 10/19/2023   High glucose level 10/19/2023   Headache disorder 10/19/2023   Hyperglycemia due to type 2 diabetes mellitus (HCC) 10/19/2023   History of endocarditis in adulthood 10/19/2023   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/19/2023   Iron deficiency anemia 10/19/2023   Parietoalveolar pneumopathy (HCC) 10/19/2023   Osteoarthritis 10/19/2023    Neuropathy 10/19/2023   Personal history of malignant neoplasm of breast 10/19/2023   Myopathy, unspecified 10/19/2023   Pure hypercholesterolemia 10/19/2023   Stage 3a chronic kidney disease (HCC) 10/19/2023   Traumatic plantar fasciitis 10/19/2023   Atherosclerosis of coronary artery 10/19/2023   CAD S/P percutaneous coronary angioplasty 10/19/2023   Chronic systolic heart failure (HCC) 10/19/2023   Type 2 diabetes mellitus with other specified complication (HCC) 10/19/2023   Adverse effect of antihyperlipidemic and antiarteriosclerotic drugs, initial encounter 10/19/2023   Adverse effect of antihyperlipidemic drug 10/19/2023   Family history of malignant neoplasm of gastrointestinal tract 10/19/2023   Shortness of breath 10/19/2023   Encounter for screening for malignant neoplasm of colon 10/19/2023   Decreased estrogen level 10/19/2023   Elevated TSH 10/19/2023   Acquired hypothyroidism 10/19/2023   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   Systemic lupus erythematosus (HCC) 10/19/2023   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) 10/19/2023   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) 10/19/2023   Malignant neoplasm of lung (HCC) 10/19/2023   Essential hypertension 10/19/2023   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) 07/22/2023   Mediastinal adenopathy 06/29/2023   Lung mass 06/20/2023   Pulmonary embolism (HCC) 06/19/2023   S/P left mastectomy 03/31/2023   Genetic testing 03/10/2023   Family history of malignant neoplasm of breast 02/26/2023   Family history of malignant neoplasm of prostate 02/26/2023   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) 02/18/2023   Long term (current) use of anticoagulants 10/13/2022   Sepsis due to pneumonia (HCC) 10/11/2022   AKI (acute kidney injury) 10/11/2022   Paroxysmal atrial flutter (HCC) 10/11/2022  Diarrhea 10/11/2022   Controlled type 2 diabetes mellitus  without complication, without long-term current use of insulin  (HCC) 10/11/2022   Mixed hyperlipidemia 10/11/2022   Atherosclerosis of aorta 10/11/2022   Diabetes mellitus type 2, noninsulin dependent (HCC) 10/11/2022   Benign hypertension 10/11/2022   Acute febrile illness 10/11/2022   Atrial flutter with rapid ventricular response (HCC) 10/11/2022   Chronic diastolic CHF (congestive heart failure) (HCC) 10/11/2022   Syncope 04/25/2022   Near syncope 02/18/2019   Hyponatremia 02/18/2019   ILD (interstitial lung disease) (HCC) 07/30/2016   Dyspnea 07/16/2016   Bibasilar crackles 07/16/2016   History of systemic lupus erythematosus (SLE) (HCC) 07/16/2016   Cough 07/16/2016   Encounter for monitoring ACE-inhibitor therapy 07/16/2016   History of asthma 07/16/2016   Hypothyroidism 01/03/2015   Primary hypertension 01/03/2015   UTI (urinary tract infection) 01/03/2015   Drug-induced hypersensitivity reaction 01/03/2015   Lupus 01/03/2015   Pyrexia 01/02/2015    Orientation RESPIRATION BLADDER Height & Weight     Self, Time, Situation, Place  Normal Continent Weight: (S) 118 lb 9.7 oz (53.8 kg) (5.7 pounds loss in weight since last weight checked) Height:  5' 6.5 (168.9 cm)  BEHAVIORAL SYMPTOMS/MOOD NEUROLOGICAL BOWEL NUTRITION STATUS      Continent Diet  AMBULATORY STATUS COMMUNICATION OF NEEDS Skin   Extensive Assist Verbally Normal                       Personal Care Assistance Level of Assistance  Total care       Total Care Assistance: Maximum assistance   Functional Limitations Info             SPECIAL CARE FACTORS FREQUENCY  PT (By licensed PT), OT (By licensed OT)     PT Frequency: 5x weekly OT Frequency: 5x weekly            Contractures Contractures Info: Not present    Additional Factors Info  Code Status Code Status Info: Full             Current Medications (11/23/2023):  This is the current hospital active medication list Current  Facility-Administered Medications  Medication Dose Route Frequency Provider Last Rate Last Admin   acetaminophen  (TYLENOL ) tablet 325-650 mg  325-650 mg Oral Q4H PRN Jerilynn Daphne SAILOR, NP   650 mg at 11/21/23 2149   albuterol  (PROVENTIL ) (2.5 MG/3ML) 0.083% nebulizer solution 2.5 mg  2.5 mg Nebulization Q6H PRN Lawrence, Brandi N, NP       alum & mag hydroxide-simeth (MAALOX/MYLANTA) 200-200-20 MG/5ML suspension 30 mL  30 mL Oral Q4H PRN Lawrence, Brandi N, NP   30 mL at 11/16/23 2055   anastrozole  (ARIMIDEX ) tablet 1 mg  1 mg Oral Daily Jerilynn Daphne SAILOR, NP   1 mg at 11/23/23 1032   apixaban  (ELIQUIS ) tablet 2.5 mg  2.5 mg Oral BID Jerilynn Daphne SAILOR, NP   2.5 mg at 11/23/23 1024   aspirin  EC tablet 81 mg  81 mg Oral Daily Jerilynn Daphne SAILOR, NP   81 mg at 11/23/23 1033   atorvastatin  (LIPITOR) tablet 40 mg  40 mg Oral Daily Lawrence, Brandi N, NP   40 mg at 11/23/23 1023   baclofen (LIORESAL) tablet 5 mg  5 mg Oral TID PRN Engler, Morgan C, DO       bisacodyl (DULCOLAX) suppository 10 mg  10 mg Rectal Daily PRN Jerilynn Daphne SAILOR, NP   10 mg at 11/15/23 1845   cephALEXin  (KEFLEX )  capsule 500 mg  500 mg Oral Q12H Emeline Search C, DO   500 mg at 11/23/23 1024   diclofenac  Sodium (VOLTAREN ) 1 % topical gel 2 g  2 g Topical QID Urbano Albright, MD   2 g at 11/22/23 2203   diltiazem  (CARDIZEM  CD) 24 hr capsule 120 mg  120 mg Oral Daily Lawrence, Brandi N, NP   120 mg at 11/23/23 1034   diphenhydrAMINE  (BENADRYL ) capsule 25 mg  25 mg Oral Q6H PRN Jerilynn Daphne SAILOR, NP       feeding supplement (ENSURE PLUS HIGH PROTEIN) liquid 237 mL  237 mL Oral BID BM Urbano Albright, MD   237 mL at 11/22/23 1615   gabapentin  (NEURONTIN ) capsule 300 mg  300 mg Oral TID Jerilynn Daphne SAILOR, NP   300 mg at 11/23/23 1024   guaiFENesin -dextromethorphan  (ROBITUSSIN DM) 100-10 MG/5ML syrup 5-10 mL  5-10 mL Oral Q6H PRN Jerilynn Daphne SAILOR, NP   10 mL at 11/10/23 2041   hydroxychloroquine  (PLAQUENIL ) tablet 200 mg   200 mg Oral Daily Jerilynn Daphne SAILOR, NP   200 mg at 11/23/23 1033   insulin  aspart (novoLOG ) injection 0-6 Units  0-6 Units Subcutaneous TID WC Urbano Albright, MD   1 Units at 11/22/23 1312   levothyroxine  (SYNTHROID ) tablet 100 mcg  100 mcg Oral Daily Lawrence, Brandi N, NP   100 mcg at 11/23/23 9382   magnesium  hydroxide (MILK OF MAGNESIA) suspension 15 mL  15 mL Oral Daily PRN Lovorn, Duwaine, MD   15 mL at 11/15/23 0539   megestrol (MEGACE) 400 MG/10ML suspension 400 mg  400 mg Oral BID Swartz, Zachary T, MD   400 mg at 11/22/23 2203   multivitamin liquid 15 mL  15 mL Oral Daily Urbano Albright, MD   15 mL at 11/23/23 1020   oxymetazoline (AFRIN) 0.05 % nasal spray 1 spray  1 spray Each Nare BID PRN Street, [place], PA-C   1 spray at 11/23/23 9377   pantoprazole  (PROTONIX ) EC tablet 40 mg  40 mg Oral BID AC Engler, Morgan C, DO   40 mg at 11/23/23 0617   polyethylene glycol (MIRALAX  / GLYCOLAX ) packet 17 g  17 g Oral Daily Lovorn, Megan, MD   17 g at 11/23/23 1043   prochlorperazine (COMPAZINE) tablet 5-10 mg  5-10 mg Oral Q6H PRN Jerilynn Daphne SAILOR, NP       Or   prochlorperazine (COMPAZINE) suppository 12.5 mg  12.5 mg Rectal Q6H PRN Jerilynn Daphne SAILOR, NP       Or   prochlorperazine (COMPAZINE) injection 5-10 mg  5-10 mg Intravenous Q6H PRN Jerilynn Daphne SAILOR, NP       senna-docusate (Senokot-S) tablet 1 tablet  1 tablet Oral QHS PRN Lawrence, Brandi N, NP   1 tablet at 11/13/23 2010   sodium chloride  tablet 1 g  1 g Oral BID WC Engler, Morgan C, DO   1 g at 11/23/23 1032   sucralfate  (CARAFATE ) 1 GM/10ML suspension 1 g  1 g Oral TID WC & HS Kriss Stagger H, DO   1 g at 11/22/23 2203     Discharge Medications: Please see discharge summary for a list of discharge medications.  Relevant Imaging Results:  Relevant Lab Results:   Additional Information SSN: [id number]  Graeme DELENA Jude, LCSW

## 2023-11-23 NOTE — Progress Notes (Addendum)
 PROGRESS NOTE    Subjective/Complaints: Continues to have occasional back/neck pain. Reports feels well otherwise. SNF options being considered, appears daughter coming today to discuss with SW.    ROS: Limited due to cognitive/behavioral. Denies CP/SOB + Painful swallowing - impoved + mild neck pain-no complaints today  Objective:   DG ESOPHAGUS W SINGLE CM (SOL OR THIN BA) Result Date: 11/23/2023 CLINICAL DATA:  Inpatient being treated for recent CVA with c/o dysphagia. Exam limited by mobility and some views limited by kyphotic posture. EXAM: ESOPHAGUS/BARIUM SWALLOW/TABLET STUDY TECHNIQUE: Single contrast examination was performed using thin liquid barium. This exam was performed by Laymon Coast, NP, and was supervised and interpreted by Dr. Johann. Images obtained in LPO and RPO positioning. FLUOROSCOPY: Radiation Exposure Index (as provided by the fluoroscopic device): 15 mGy Kerma COMPARISON:  None Available. FINDINGS: Swallowing: Appears normal. No vestibular penetration or aspiration seen. Pharynx: Unremarkable. Esophagus: Normal appearance. Esophageal motility: Esophageal dysmotility seen as tertiary contractions resulting in contrast stasis within the mid and distal esophagus. Hiatal Hernia: Small sliding type hiatal hernia seen. Gastroesophageal reflux: None visualized. Ingested 13 mm barium tablet: Passed without significant delay to the stomach. Other: None. IMPRESSION: 1.  Esophageal dysmotility. 2.  Small sliding type hiatal hernia. Electronically Signed   By: JONETTA Johann M.D.   On: 11/23/2023 12:17   DG Swallowing Func-Speech Pathology Result Date: 11/23/2023 Table formatting from the original result was not included. Modified Barium Swallow Study Patient Details Name: [name] MRN: [id number] Date of Birth: 1940-05-14 Today's Date: 11/23/2023 HPI/PMH: HPI: [name] is a 83 y.o. 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       CT of the head revealed large hypodensity on the right concerning for large territory infarct.  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  Stroke consistent with embolic.  Patient apparently missed Eliquis  doses. Echo shows EF 60%, increased RVSP at 36.9 mmHg, mild MR, moderate AR. Continue aspirin , statin.  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 S/p radiation, on anastrozole .She is at risk for strokes due to her cancer history. Therapies are recommending intensive rehab. Clinical Impression: Patient presents with a functional oropharyngeal swallow. Oral phase is remarkable for mild oral residuals and mildly prolonged mastication times (patient with missing dentition) however patient is able to clear w/ additional swallows or liquid wash. Pharyngeal phase is remarkable for complete epiglottic inversion, laryngeal vestibular closure and hyoid excursion. Noted x1 instance of trace penetration, however no other instances of penetration/aspiration across all consistencies tested. Full clearance of pharyngeal space. Patient unable to transit pill from oral cavity, therefore expectorated. Recommend continuation of D3/thin liquid diet with medications crushed in puree. Patient should receive physical and verbal assistance during meals to cue for head up posture. Factors that may increase risk of adverse event in presence of aspiration Noe &  Lianne 2021): No data recorded Recommendations/Plan: Swallowing Evaluation Recommendations Swallowing  Evaluation Recommendations Recommendations: PO diet PO Diet Recommendation: Dysphagia 3 (Mechanical soft); Thin liquids (Level 0) Liquid Administration via: Straw Medication Administration: Crushed with puree Supervision: Full assist for feeding Swallowing strategies  : Slow rate; Small bites/sips Postural changes: Position pt fully upright for meals Oral care recommendations: Oral care BID (2x/day) Treatment Plan Treatment Plan Treatment recommendations: Therapy as outlined in treatment plan below Follow-up recommendations: Follow physicians's recommendations for discharge plan and follow up therapies Functional status assessment: Patient has had a recent decline in their functional status and demonstrates the ability to make significant improvements in function in a reasonable and predictable amount of time. Treatment frequency: Min 2x/week Treatment duration: 2 weeks Interventions: Compensatory techniques; Patient/family education; Diet toleration management by SLP Recommendations Recommendations for follow up therapy are one component of a multi-disciplinary discharge planning process, led by the attending physician.  Recommendations may be updated based on patient status, additional functional criteria and insurance authorization. Assessment: Orofacial Exam: Orofacial Exam Oral Cavity - Dentition: Edentulous Orofacial Anatomy: WFL Anatomy: Anatomy: WFL Boluses Administered: Boluses Administered Boluses Administered: Thin liquids (Level 0); Mildly thick liquids (Level 2, nectar thick); Puree; Solid  Oral Impairment Domain: Oral Impairment Domain Lip Closure: Interlabial escape, no progression to anterior lip Tongue control during bolus hold: Cohesive bolus between tongue to palatal seal Bolus preparation/mastication: Slow prolonged chewing/mashing with complete recollection Bolus transport/lingual motion: Brisk tongue motion Oral residue: Trace residue lining oral structures Location of oral residue : Tongue  Initiation of pharyngeal swallow : Posterior laryngeal surface of the epiglottis  Pharyngeal Impairment Domain: Pharyngeal Impairment Domain Soft palate elevation: No bolus between soft palate (SP)/pharyngeal wall (PW) Laryngeal elevation: Complete superior movement of thyroid  cartilage with complete approximation of arytenoids to epiglottic petiole Anterior hyoid excursion: Complete anterior movement Epiglottic movement: Complete inversion Laryngeal vestibule closure: Complete, no air/contrast in laryngeal vestibule Pharyngeal stripping wave : Present - complete Pharyngeal contraction (A/P view only): N/A Pharyngoesophageal segment opening: Complete distension and complete duration, no obstruction of flow Tongue base retraction: No contrast between tongue base and posterior pharyngeal wall (PPW) Pharyngeal residue: Trace residue within or on pharyngeal structures  Esophageal Impairment Domain: Esophageal Impairment Domain Esophageal clearance upright position: Complete clearance, esophageal coating Pill: Pill Consistency administered: -- (expectorated) Penetration/Aspiration Scale Score: Penetration/Aspiration Scale Score 1.  Material does not enter airway: Puree; Solid; Mildly thick liquids (Level 2, nectar thick) 2.  Material enters airway, remains ABOVE vocal cords then ejected out: Thin liquids (Level 0) Compensatory Strategies: Compensatory Strategies Compensatory strategies: Yes Straw: Effective Effective Straw: Thin liquid (Level 0); Mildly thick liquid (Level 2, nectar thick)   General Information: No data recorded Diet Prior to this Study: Dysphagia 3 (mechanical soft); Thin liquids (Level 0)   No data recorded  Respiratory Status: WFL   Supplemental O2: None (Room air)   No data recorded Behavior/Cognition: Alert; Lethargic/Drowsy Self-Feeding Abilities: Able to self-feed Baseline vocal quality/speech: Normal Volitional Cough: Able to elicit Volitional Swallow: Able to elicit Exam Limitations: Poor  positioning Goal Planning: Prognosis for improved oropharyngeal function: Good No data recorded No data recorded No data recorded Consulted and agree with results and recommendations: Patient; Nurse Pain: No data recorded End of Session: Start Time:No data recorded Stop Time: No data recorded Time Calculation:No data recorded Charges: No data recorded SLP visit diagnosis: SLP Visit Diagnosis: Dysphagia, unspecified (R13.10) Past Medical History: Past Medical History: Diagnosis Date  Acute bronchitis 12/17/2021  Acute cystitis 02/20/2019  Arthritis   Atrial flutter (HCC)   Breast cancer (HCC)   left  2009 and 2025  Diabetes mellitus without complication (HCC)   Dyspnea   mostly with exertion  GERD (gastroesophageal reflux disease)   History of radiation therapy   Left Lung-08/09/23-10/01/23- Dr. 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[facility])   Lupus   Pneumonia 07/31/2021 Past Surgical History: Past Surgical History: Procedure Laterality Date  BREAST BIOPSY Left 01/15/2023  US  LT BREAST BX W LOC DEV 1ST LESION IMG BX SPEC US  GUIDE 01/15/2023 [facility] MAMMOGRAPHY  BREAST LUMPECTOMY Left 2009  BRONCHIAL BIOPSY  06/29/2023  Procedure: BRONCHOSCOPY, WITH BIOPSY;  Surgeon: [name], MD;  Location: [facility] ENDOSCOPY;  Service: Pulmonary;;  BRONCHIAL BRUSHINGS  06/29/2023  Procedure: BRONCHOSCOPY, WITH BRUSH BIOPSY;  Surgeon: [name], MD;  Location: [facility] ENDOSCOPY;  Service: Pulmonary;;  BRONCHIAL NEEDLE ASPIRATION BIOPSY  06/29/2023  Procedure: BRONCHOSCOPY, WITH NEEDLE ASPIRATION BIOPSY;  Surgeon: [name], MD;  Location: [facility] ENDOSCOPY;  Service: Pulmonary;;  IR 3D INDEPENDENT WKST  05/29/2021  IR ANGIO INTRA EXTRACRAN SEL COM CAROTID INNOMINATE BILAT MOD SED  05/29/2021  IR ANGIO VERTEBRAL SEL VERTEBRAL BILAT MOD SED  05/29/2021  LEFT HEART CATH AND CORONARY ANGIOGRAPHY N/A 11/01/2023  Procedure: LEFT HEART CATH AND CORONARY ANGIOGRAPHY;  Surgeon: Wonda Sharper, MD;  Location[facility]  INVASIVE CV LAB;  Service: Cardiovascular;  Laterality: N/A;  SIMPLE MASTECTOMY WITH AXILLARY SENTINEL NODE BIOPSY Left 03/31/2023  Procedure: LEFT MASTECTOMY;  Surgeon: Vernetta Berg, MD;  Location: [facility] OR;  Service: General;  Laterality: Left;  LMA PEC BLOCK  VIDEO BRONCHOSCOPY WITH ENDOBRONCHIAL NAVIGATION Left 06/29/2023  Procedure: VIDEO BRONCHOSCOPY WITH ENDOBRONCHIAL NAVIGATION;  Surgeon: [name], MD;  Location: [facility] ENDOSCOPY;  Service: Pulmonary;  Laterality: Left;  VIDEO BRONCHOSCOPY WITH ENDOBRONCHIAL ULTRASOUND Bilateral 06/29/2023  Procedure: BRONCHOSCOPY, WITH EBUS;  Surgeon: [name], MD;  Location: [facility] ENDOSCOPY;  Service: Pulmonary;  Laterality: Bilateral; Cassidi F Sockwell 11/23/2023, 10:22 AM   Recent Labs    11/22/23 0518 11/23/23 0502  WBC 5.3 6.5  HGB 8.1* 8.2*  HCT 24.0* 24.2*  PLT 131* 132*    Recent Labs    11/22/23 0518 11/23/23 0502  NA 130* 130*  K 4.6 4.3  CL 100 102  CO2 21* 20*  GLUCOSE 156* 110*  BUN 21 18  CREATININE 1.03* 0.96  CALCIUM  9.3 9.2     Intake/Output Summary (Last 24 hours) at 11/23/2023 1318 Last data filed at 11/23/2023 0800 Gross per 24 hour  Intake 220 ml  Output --  Net 220 ml        Physical Exam: Vital Signs Blood pressure 119/88, pulse 73, temperature 98.9 F (37.2 C), resp. rate 20, height 5' 6.5 (1.689 m), weight (S) 53.8 kg, SpO2 100%.   Constitutional: No distress . Vital signs reviewed.  Sitting up in bed. Appears chronically ill but comfortable at the moment.  HEENT: NCAT, EOMI, oral membranes moist. Neck: supple Cardiovascular: RRR without murmur. No JVD    Respiratory/Chest: CTA Bilaterally without wheezes or rales. Normal effort    GI/Abdomen: BS +, non-tender, non-distended Ext: no clubbing, cyanosis, or edema Psych: flat but cooperative  Skin: Clean and intact without signs of breakdown  Neuro: Awake and alert, delayed responses.  Minimal verbal output.   Decreased sensation light touch  right lower extremity.  Decreased to light touch on RLE< but not RUE or face  RUE- tr/5 ff x1, unable to  repeat; 0/5 otherwise  LUE 4+/5 throughout RLE- HF 2/5; KE/KF 2/5; DF 0/5; PF 2-/5 LLE- 4+/5 throughout  Tone: MAS 2 right shoulder, MAS 3 right elbow, MAS 0 wrists, fingers, MAS 2 knee flexors  Prior neuro assessment is c/w today's exam 11/23/2023.   Musculoskeletal: No significant neck tenderness or pain with flexion extension of the neck noted.  No significant periscapular muscle tenderness noted     Assessment/Plan: 1. Functional deficits which require 3+ hours per day of interdisciplinary therapy in a comprehensive inpatient rehab setting. Physiatrist is providing close team supervision and 24 hour management of active medical problems listed below. Physiatrist and rehab team continue to assess barriers to discharge/monitor patient progress toward functional and medical goals  Care Tool:  Bathing    Body parts bathed by patient: Right arm, Left arm, Chest, Abdomen, Front perineal area   Body parts bathed by helper: Abdomen, Chest, Front perineal area, Buttocks, Right upper leg, Left upper leg, Right lower leg, Left lower leg, Face     Bathing assist Assist Level: Total Assistance - Patient < 25%     Upper Body Dressing/Undressing Upper body dressing   What is the patient wearing?: Hospital gown only, Pull over shirt    Upper body assist Assist Level: Total Assistance - Patient < 25%    Lower Body Dressing/Undressing Lower body dressing      What is the patient wearing?: Underwear/pull up     Lower body assist Assist for lower body dressing: Total Assistance - Patient < 25%     Toileting Toileting    Toileting assist Assist for toileting: Total Assistance - Patient < 25%     Transfers Chair/bed transfer  Transfers assist  Chair/bed transfer activity did not occur: Safety/medical concerns (fatigue)  Chair/bed transfer assist level: Maximal Assistance  - Patient 25 - 49%     Locomotion Ambulation   Ambulation assist   Ambulation activity did not occur: Safety/medical concerns          Walk 10 feet activity   Assist  Walk 10 feet activity did not occur: Safety/medical concerns        Walk 50 feet activity   Assist Walk 50 feet with 2 turns activity did not occur: Safety/medical concerns         Walk 150 feet activity   Assist Walk 150 feet activity did not occur: Safety/medical concerns         Walk 10 feet on uneven surface  activity   Assist Walk 10 feet on uneven surfaces activity did not occur: Safety/medical concerns         Wheelchair     Assist Is the patient using a wheelchair?: Yes Type of Wheelchair: Manual (TIS)    Wheelchair assist level: Dependent - Patient 0%      Wheelchair 50 feet with 2 turns activity    Assist        Assist Level: Dependent - Patient 0%   Wheelchair 150 feet activity     Assist      Assist Level: Dependent - Patient 0%   Blood pressure 119/88, pulse 73, temperature 98.9 F (37.2 C), resp. rate 20, height 5' 6.5 (1.689 m), weight (S) 53.8 kg, SpO2 100%.  Medical Problem List and Plan: 1. Functional deficits secondary to  bilateral embolic shower, embolic pattern, etiology:  Afib and missed Eliquis  doses vs from NSTEMI with cardiac cath.              -patient may  shower             -ELOS/Goals: 12-15 days min A-              Cannot sit upright without propping            -Continue CIR therapies including PT, OT, and SLP   - Ordered PRAFO/WHO right upper extremity, sling for right upper extremity also ordered  -Will plan for palliative consult for goals of care - notified daughter and pt, pt seen yesterday, appreciate assistance  - Recheck CBC and BMP today due to reports of fatigue but overall appears around her baseline, Check CXR- later in afternoon pt reported to nursing/therapy she had cough in AM that resolved, per therapy  patient reporting she feels she may have pneumonia - says she's had it before and feels like it's the same , Will add U/A  -possibly mood related?, she had recent goals of care discussion   10-25: Chest x-ray unchanged, increasingly hyponatremic.  Increase IV fluids to 75 cc/h, urinalysis pending.  10-26: Lethargy improving, more engaged today. 11-22-26 lethargy remains improved(back to basline), consider SNF recommendation   2.  Antithrombotics: -DVT/anticoagulation:  Pharmaceutical: Eliquis  Transitioned from IV Heparin  to Eliquis  10/14             -antiplatelet therapy: Asprin 81 mg  3. Pain Management: Gabapentin  and Tylenol  as needed  -Voltaren  R shoulder for R shoulder pain  - 10/17 patient now reporting some pain in her left shoulder, suspect from increased use with therapy.  Right shoulder doing better.  Will try Voltaren  gel to left shoulder, consider imaging if does not improve  10/18 added kpad for neck/shoulder as well  -10/22-23 Continue voltaren  gel for occasional shoulder pain  - Can try K-pad for neck as she has some soreness this morning  11-19-25 no complaints of pain  4. Mood/Behavior/Sleep: LCSW to follow for evaluation and support when available.              -antipsychotic agents: N/A 5. Neuropsych/cognition: This patient is not quite capable of making decisions on her own behalf. 6. Skin/Wound Care: routine pressure relief measures.  7. Fluids/Electrolytes/Nutrition: monitor intake and output. Follow up labs in a.m.              - SLP consult: Carb modified diet + Ensure  -nutrition consult for decreased p.o. intake  10/18 intake remains minimal. RD following   -added megace for appetitte  10/19 obsv today. Recheck labs, prealbumin in AM   10/20 Prealbumin 21 this AM, continue to monitor PO intake  10/22 Hx of pain with swallowing, appears has been chronic for several, nursing reports daughter said yesterday she was seen by GI in the past. Started PPI  10/23  called GI for consult- she has been seen by [facility] outpatient  10/24 Seen by GI appreciate- Barium swallow, Sucralfate   She ate 40-50% meals yesterday, poor intake but a little better  11-19-25: Eating 25 to 50% of meals  Seen by nutrition- for severe malnutrition, appreciate assistance  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 Continue aspirin  and statin.   9. CAD: 10/4-10/7 admitted for NSTEMI  10. HLD: Lipitor 40 mg  11. HTN: monitor blood pressures on  Hyzaar, Cardizem , and Aldactone      11/23/2023    6:02 AM 11/22/2023    9:01 PM 11/22/2023    1:20 PM  Vitals with BMI  Systolic 119 143 874  Diastolic 88 56 61  Pulse 73 81 80  - 10/27-28 BP stable, continue current  12. Paroxymal Atrial Flutter/Fib: continue Eliquis  2.5 bid  13. T2DM: HgbA1c 6.0, resumed home Amaryl   - monitor cbg AC/HS with SSI.  CBG (last 3)  Recent Labs    11/22/23 2058 11/23/23 0612 11/23/23 1152  GLUCAP 164* 114* 128*  - 10/15 Will order CBG/SSI  - CBGs well-controlled, holding amaryl . But not eating much  -10/20-28 controlled, continue current regimen, continued poor PO intake  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  Hypothyroidism: Synthroid  17.  Chronic anemia: Stable, monitor CBC.   -10/17 Hgb improved to 10 yesterday  -10/18 lower HGB at 8.4 suspect dilutional due to IVF, recheck tomorrow  - 10-26: Hemoglobin from 10-8.2.  May be dilutional due to IV fluids, will get iron studies and FOBT to evaluate --FOBT positive, hesitant to hold [facility] given recent embolic strokes, will increase GI prophylaxis to 40 mg twice daily and defer AC discussion to primary team.  Trend hemoglobin. -10/27 stable overall at 8.2, appears to fluctuate, continue to monitor for now for reasons as above   Recheck tomorrow   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  Continue to monitor  -10/20 NA 131 today, continue to monitor   -10/23 NA stable 131  10-25: Sodium 128 yesterday, repeat today similar.  Serum osmole's within normal limits, increasing IV fluids from 50 to 75 cc/h overnight and repeat labs in AM.  Placed 1200 cc p.o. fluid restriction.  Urine studies pending.  10-26: Sodium remains 128 despite IV fluids; urine/serum osmole's indicative of SIADH. ?  Due to lung pathology, DC trazodone , DC IV fluids, start salt tabs 1 g 2 times daily and repeat labs in AM.  10/28 NA stable 130, continue current   21. Azotemia  -10/20 IVF NS 50ml/hr  -10/23 BUN and CR improved, continue IVF at night for poor oral intake, can hold if drinks 1L  10-25: Creatinine increased to 1.02; increasing IV fluids as above  10-26: Creatinine 0.94, DC IV fluids due to SIADH as above.  -10/27 Cr/BUN  trending up as IVF stopped, recheck tomorrow  -10/28 BUN, CR improved, continue current regimen and monitor   22.  Urinary incontinence/UTI  - 10-25 urinalysis mildly positive; start Keflex  500 mg twice daily for 7 days; follow sensitivities- Ecoli sensitive to cefazolin - continue current regimen  23.  Right hemiparesis/spasticity  - 10-26: Due to cognitive issues and lethargy, hesitant to schedule muscle relaxers.  Start baclofen 5 mg 3 times daily as needed.  -Continue PRN as above to avoid sedation  LOS: 14 days A FACE TO FACE EVALUATION WAS PERFORMED  Haen Collier 11/23/2023, 1:18 PM

## 2023-11-23 NOTE — Progress Notes (Addendum)
 Occupational Therapy Weekly Progress Note  Patient Details  Name: [name] MRN: [id number] Date of Birth: [birth year]  Beginning of progress report period: November 16, 2023 End of progress report period: November 23, 2023   Session 1: Today's Date: 11/23/2023 OT Individual Time: 8699-8654 OT Individual Time Calculation (min): 45 min   Session 2: Today's Date: 11/23/2023 OT Individual Time: 8452-8387 OT Individual Time Calculation (min): 25 min   Patient has met 0 of 3 short term goals.  Patient continues to demonstrate fluctuating levels of orientation and alertness during sessions leading to decreased level of performance. Patient demonstrates poor ability to sit upright while completing dynamic tasks often leading to total A required to complete ADL tasks. Patients daughter has come in for caregiver training to evaluate patients current levels and discuss home with 24 hour assist at bed level with hoyer required vs SNF depending on patient progress. Patient demonstrates increased sitting balance at times, being able to look forward while sitting upright both supported and unsupported, leading to increased performance, however for shorter bouts due to decreased activity tolerance. Due to this it is recommended that patient continue skilled OT to maximize performance in ADLs, decrease caregiver burden.   Patient continues to demonstrate the following deficits: muscle weakness, decreased cardiorespiratoy endurance, impaired timing and sequencing, unbalanced muscle activation, motor apraxia, ataxia, decreased coordination, and decreased motor planning, decreased initiation, decreased attention, decreased problem solving, decreased safety awareness, decreased memory, and delayed processing, and decreased sitting balance, decreased standing balance, decreased postural control, hemiplegia, decreased balance strategies, and difficulty maintaining precautions and therefore will continue to benefit  from skilled OT intervention to enhance overall performance with BADL and Reduce care partner burden.  Patient not progressing toward long term goals.  See goal revision..  Plan of care revisions: patient goals revised due to decreased level of performance vs evaluation. Patient is demonstrating fluctuating levels .  OT Short Term Goals Week 3:  OT Short Term Goal 1 (Week 3): patient will demonstrate toilet transfers with maxA OT Short Term Goal 2 (Week 3): patient will complete LB donning with max A of 1 OT Short Term Goal 3 (Week 3): patient be able to complete static standing in prep for ADLS with max A OT Short Term Goal 4 (Week 3): patient will complete toileting on BSC or toilet with max A   Session 1: Skilled Therapeutic Interventions/Progress Updates:   Patient agreeable to participate in OT session. Reports no pain level.   Patient participated in skilled OT session focusing on functional mobility with WC, proper positioning and standing. Patient received in bed. OT total A for supine to sit. Completed transfer to power chair with total A. Patient required mod A in open hallway, total A for tight spaces for power wc management. Patient able to complete wc management for increased mobility due to hemi. Patient observed to have increased posture in power wc. Patient transferred to mat with max x2 in gym. Total A to max A x2 to stand in steady for 2x sit to stand with UE/LE facilitation. Completed transfer back to wc. Due to decreased activity tolerance and decreased alertness OT navigated power wc back to room. All needs in reach alarm on.    Session 2: Skilled Therapeutic Interventions/Progress Updates:  OT arrived to room following patient rapid response with nursing present. Due to patients condition, OT and nursing determined patient not suitable for physical activity. Due to decreased availability OT able to complete caregiver education for patient progress. With  patient present OT  and patient daughter discussed needs for home, home vs SNF , and possible palliative care options. OT discussed searching for SNF, OT needs for patient and level of assist. OT will provide patient support groups for patient and caregiver. OT described therapeutic and hospital process with items that can be given and ordered via case worker. Patient reports understanding and will ask any follow up questions when she has them.    Therapy Documentation Precautions:  Precautions Precautions: Fall Recall of Precautions/Restrictions: Intact Precaution/Restrictions Comments: SBP <180, R hemiparesis Required Braces or Orthoses: Other Brace Other Brace: R LE PRAFO, R UE sling, R UE resting hand splint Restrictions Weight Bearing Restrictions Per Provider Order: No  Therapy/Group: Individual Therapy  D'mariea L Keziah Avis 11/23/2023, 7:28 AM

## 2023-11-23 NOTE — Progress Notes (Signed)
 Patient ID: [name], female   DOB: 17-Dec-1940, 83 y.o.   MRN: [id number] Met with pt and spoke with [name]-daughter via telephone to discuss the plan and if daughter is able to provide the amount of care pt requires. Discussed options of home versus SNF. Daughter coming in today will meet with both to discuss plans. Daughter has been looking into getting CAP/aide care for mom prior to admission to the hospital.

## 2023-11-23 NOTE — Significant Event (Signed)
 Rapid Response Event Note   Reason for Call :  Left shoulder pain, radiating to her neck  Initial Focused Assessment:  Pt lying in bed, alert. Grimacing to pain. Pain worsening with palpation to left trapezius muscle and elevating and lowering head of bed. Skin is warm, dry, pink. Peripheral pulses 2+. Heart rate regular. Clear breath sounds.   VS: T 97.78F, BP 134/54 (77), HR 79, RR 20, SpO2 99% on room air  Interventions:  -Troponin and EKG per primary team -Scheduled Voltaren  applied to left shoulder, per order -PRN Baclofen administered   Plan of Care:     Event Summary:  MD Notified: per primary RN Call Time: 1520 Arrival Time: 1530 End Time: 1640  Leonor LITTIE Danker, RN

## 2023-11-24 ENCOUNTER — Inpatient Hospital Stay (HOSPITAL_COMMUNITY)

## 2023-11-24 LAB — GLUCOSE, CAPILLARY
Glucose-Capillary: 130 mg/dL — ABNORMAL HIGH (ref 70–99)
Glucose-Capillary: 86 mg/dL (ref 70–99)
Glucose-Capillary: 109 mg/dL — ABNORMAL HIGH (ref 70–99)
Glucose-Capillary: 149 mg/dL — ABNORMAL HIGH (ref 70–99)

## 2023-11-24 LAB — BASIC METABOLIC PANEL WITH GFR
Anion gap: 9 (ref 5–15)
BUN: 17 mg/dL (ref 8–23)
CO2: 20 mmol/L — ABNORMAL LOW (ref 22–32)
Calcium: 9.3 mg/dL (ref 8.9–10.3)
Chloride: 101 mmol/L (ref 98–111)
Creatinine, Ser: 1.05 mg/dL — ABNORMAL HIGH (ref 0.44–1.00)
GFR, Estimated: 53 mL/min — ABNORMAL LOW (ref >60)
Glucose, Bld: 130 mg/dL — ABNORMAL HIGH (ref 70–99)
Potassium: 4.3 mmol/L (ref 3.5–5.1)
Sodium: 130 mmol/L — ABNORMAL LOW (ref 135–145)

## 2023-11-24 LAB — CBC
HCT: 24.3 % — ABNORMAL LOW (ref 36.0–46.0)
Hemoglobin: 8.1 g/dL — ABNORMAL LOW (ref 12.0–15.0)
MCH: 28.7 pg (ref 26.0–34.0)
MCHC: 33.3 g/dL (ref 30.0–36.0)
MCV: 86.2 fL (ref 80.0–100.0)
Platelets: 132 K/uL — ABNORMAL LOW (ref 150–400)
RBC: 2.82 MIL/uL — ABNORMAL LOW (ref 3.87–5.11)
RDW: 17.4 % — ABNORMAL HIGH (ref 11.5–15.5)
WBC: 6 K/uL (ref 4.0–10.5)
nRBC: 0 % (ref 0.0–0.2)

## 2023-11-24 MED ORDER — LIDOCAINE 5 % EX PTCH
1.0000 | MEDICATED_PATCH | CUTANEOUS | Status: DC
  Administered 2023-11-24 – 2023-12-08 (×14): 1 via TRANSDERMAL
  Filled 2023-11-24 (×14): qty 1

## 2023-11-24 MED ORDER — ESCITALOPRAM OXALATE 10 MG PO TABS
10.0000 mg | ORAL_TABLET | Freq: Every day | ORAL | Status: DC
  Administered 2023-11-24 – 2023-12-02 (×9): 10 mg via ORAL
  Filled 2023-11-24 (×9): qty 1

## 2023-11-24 NOTE — Progress Notes (Signed)
 Occupational Therapy Session Note  Patient Details  Name: [name] MRN: [id number] Date of Birth: [birth year]  Today's Date: 11/24/2023 OT Individual Time: 1000-1045 OT Individual Time Calculation (min): 45 min    Short Term Goals: Week 3:  OT Short Term Goal 1 (Week 3): patient will demonstrate toilet transfers with maxA OT Short Term Goal 2 (Week 3): patient will complete LB donning with max A of 1 OT Short Term Goal 3 (Week 3): patient be able to complete static standing in prep for ADLS with max A OT Short Term Goal 4 (Week 3): patient will complete toileting on BSC or toilet with max A  Skilled Therapeutic Interventions/Progress Updates:   Patient agreeable to participate in OT session. Reports no pain level.   Patient participated in skilled OT session focusing on self care, alertness, and functional mobility. Patient completed UB dressing max A, LB dressing max to total A with max A to roll to each side. Completed all ADLs bed level due to balance. Patient able to complete transfer max A to total A squat pivot. Patient completed grooming at sink for mouth washing with SU assist. Completed 2x sit to stands for functional balance and activity tolerance. Patient then set up in chair with alarm on, all needs in reach.    Therapy Documentation Precautions:  Precautions Precautions: Fall Recall of Precautions/Restrictions: Intact Precaution/Restrictions Comments: SBP <180, R hemiparesis Required Braces or Orthoses: Other Brace Other Brace: R LE PRAFO, R UE sling, R UE resting hand splint Restrictions Weight Bearing Restrictions Per Provider Order: No  Therapy/Group: Individual Therapy  D'mariea L Brynnan Rodenbaugh 11/24/2023, 7:18 AM

## 2023-11-24 NOTE — Progress Notes (Signed)
 Barium swallow reviewed yesterday no significant abnormality can follow-up with her primary gastroenterologist Dr. Saintclair in the office if swallowing problems continue and please call me if I can be of any assistance with this hospital stay

## 2023-11-24 NOTE — Progress Notes (Signed)
 Speech Language Pathology Weekly Progress and Session Note  Patient Details  Name: [name] MRN: [id number] Date of Birth: 1940-05-28  Beginning of progress report period: November 17, 2023 End of progress report period: November 24, 2023  Today's Date: 11/24/2023 SLP Individual Time: 8757-8657 SLP Individual Time Calculation (min): 60 min  Short Term Goals: Week 2: SLP Short Term Goal 1 (Week 2): Patient will increase speech intelligiblity to 70% at the sentence level given mod multimodal A SLP Short Term Goal 1 - Progress (Week 2): Met SLP Short Term Goal 2 (Week 2): Patient will demonstrate problem solving skills in mildly complex situations given min multimodal A SLP Short Term Goal 2 - Progress (Week 2): Not met SLP Short Term Goal 3 (Week 2): Patient will recall and utilize memory compensatory strategies given min multimodal A SLP Short Term Goal 3 - Progress (Week 2): Met    New Short Term Goals: Week 3: SLP Short Term Goal 1 (Week 3): STG = LTG due to ELOS  Weekly Progress Updates: Pt has made great gains and has met 2 of 3 STGs this reporting period due to improved memory and speech intelligibility. Currently, patient continues to require min A for recall of daily information and use of speech intelligibility strategies to reach 80% at the sentence level. Patient did not meet problem solving goal this session due to continuing to require modA during mildly complex activities/R attention. Pt/family eduction ongoing. Pt would benefit from continued ST intervention to maximize dysphagia, cognition and dysarthria in order to maximize functional independence at d/c.    Intensity: Minumum of 1-2 x/day, 30 to 90 minutes Frequency: 3 to 5 out of 7 days Duration/Length of Stay: 11/5 Treatment/Interventions: Cognitive remediation/compensation;Dysphagia/aspiration precaution training;Internal/external aids;Speech/Language facilitation;Therapeutic Activities;Functional  tasks;Multimodal communication approach;Patient/family education   Daily Session  Skilled Therapeutic Interventions:  Skilled therapy session focused on dysphagia, communicative and cognitive goals. SLP facilitated session by assisting patient in lunch meal (D3/thin liquids). Patient with mildly prolonged mastication, however complete oral clearance. Observed x1 instance of cough after thin liquids, however no other s/sx of aspiration. Recommend continuation of current diet with physical assistance for feeding. SLP targeted cognitive goals through memory task. SLP review WRAP memory strategies and encouraged use during paragraph retention task. Patient independently recalled all important details after a 15 minute delay. During delay, patient verbalized information regarding life history with 80% intelligibility at the sentence level. Patient emotionally labile throughout requiring encouragement from SLP. Patient left in [facility] with call bell in reach. Continue POC.      Pain None reported   Therapy/Group: Individual Therapy  Sakai Wolford M.A., CCC-SLP 11/24/2023, 1:44 PM

## 2023-11-24 NOTE — Progress Notes (Signed)
 Daily Progress Note   Patient Name: [name]       Date: 11/24/2023 DOB: [birth year]  Age: 83 y.o. MRN#: [id number] Attending Physician: Urbano Albright, MD Primary Care Physician: [name], MD Admit Date: 11/09/2023  Reason for Consultation/Follow-up: Establishing goals of care  Subjective: Medical records reviewed including progress notes, labs and imaging.  Patient assessed at the bedside at 2:40 PM.  She reports improvement in her shoulder pain from yesterday.  Her daughter was not present yet.  Patient is not sure when she will be present.  Patient was agreeable to proceeding with goals of care discussion today.  Initially was very emotional about her situation, sharing its most worrisome to her that she might not get the help she needs at home.  Reassurance was provided.  She understands that SNF options are currently being pursued and explored.  Considered her quality of life and what may or may not be acceptable to her.  Patient feels if she needed long-term care not only short-term rehab, it would be hard for her.  She would not find it acceptable to be completely bedbound and would want to get out into a wheelchair.  She would not find it acceptable to be nonverbal.  She would be fine with a PEG as well as CPR.  She would not want mechanical ventilation.  Education was provided regarding the likelihood of requiring intubation after CPR if this is successful.  She is ultimately agreeable to remaining a full code and going through both if necessary.  Emphasize importance of considering what might happen after SNF.  Reviewed option of transition to home with hospice if she is not agreeable to long-term care placement.  Patient verbalized understanding.  There are no other limitations or concerns that she would like to discuss  today.  Called patient's daughter [name] and provided update on the above conversation.  Discussed appropriateness of outpatient palliative care for continued conversations based on how things go at [facility]  Confirmed that LCSW brought by list for SNF facilities while I was at the bedside.  She will be coming by later today at some point.   Questions and concerns addressed. PMT will continue to support holistically.   Length of Stay: 15   Physical Exam Vitals and nursing note reviewed.  Constitutional:      General: She is not in acute distress. HENT:     Head: Normocephalic and atraumatic.  Cardiovascular:     Rate and Rhythm: Normal rate.  Pulmonary:     Effort: Pulmonary effort is normal.  Skin:    General: Skin is warm  and dry.  Neurological:     Mental Status: She is alert. Mental status is at baseline.     Motor: Weakness present.  Psychiatric:        Mood and Affect: Mood normal.        Behavior: Behavior normal. Behavior is cooperative.            Vital Signs: BP (!) 128/50 (BP Location: Right Arm)   Pulse 76   Temp 98.9 F (37.2 C)   Resp 17   Ht 5' 6.5 (1.689 m)   Wt (S) 53.8 kg Comment: 5.7 pounds loss in weight since last weight checked  SpO2 100%   BMI 18.86 kg/m  SpO2: SpO2: 100 % O2 Device: O2 Device: Room Air O2 Flow Rate:     Palliative Care Assessment & Plan   Patient Profile: 83 y.o. female  with past medical history of A.fib on Eliquis , recent dx of adenocarcinoma of lung undergoing radiation treatment, hx breast cancer s/p mastectomy, CAD, SLE with interstitial lung disease, hypertension, DM2, hypothyroidism, recent admission for NSTEMI with right heart cath (10/4-10/7)  admitted on 11/09/2023 with right sided weakness, slurred speech, altered mental status.    Initial CT imaging of the head indicated a large hypodensity on the right side, suggestive of a large territory infarct. An MRI of the brain revealed multiple acute infarcts in a bilateral  watershed distribution, affecting the convexities, inferior right frontal lobe, right caudate head, posterior parietal occipital lobes, right lateral pons, and posterior cerebellum. There were also bilateral embolic watershed infarcts and bilateral cerebellum infarcts identified.  Patient ultimately went to CIR for rehabilitation for her functional deficits.   PMT has been consulted to assist with goals of care conversation.  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  Recommendations/Plan: Continue full code/full scope treatment  Patient would not want intubation but ultimately agreed to remaining a full code as she would like to have CPR if necessary Would be okay with PEG Would not find it acceptable to be bedbound or nonverbal Would have great difficulty if she required long-term care placement Discussed option of hospice if quality of life decline further Patient would benefit from outpatient palliative care referral Psychosocial and emotional support provided PMT will continue to follow    Prognosis:  Unable to determine  Discharge Planning: To Be Determined  Care plan was discussed with Patient, patient's daughter, LCSW   Sheila Ocasio SHAUNNA Fell, PA-C  Palliative Medicine Team Team phone # [phone]  Thank you for allowing the Palliative Medicine Team to assist in the care of this patient. Please utilize secure chat with additional questions, if there is no response within 30 minutes please call the above phone number.  Palliative Medicine Team providers are available by phone from 7am to 7pm daily and can be reached through the team cell phone.  Should this patient require assistance outside of these hours, please call the patient's attending physician.    Time Total:  50  Visit consisted of counseling and education dealing with the complex and emotionally intense issues of symptom management and palliative care in the setting of serious and potentially life-threatening illness. Greater than 50% of this time was spent counseling and coordinating care related to the above assessment and plan.  Personally spent 50 minutes in patient care including extensive chart review (labs, imaging, progress/consult notes,  vital signs), medically appropraite exam, discussed with treatment team, education to patient, family, and staff, documenting clinical information, medication review and management, coordination of care, and available advanced directive documents.

## 2023-11-24 NOTE — Plan of Care (Signed)
  Problem: Consults Goal: RH STROKE PATIENT EDUCATION Description: See Patient Education module for education specifics  Outcome: Progressing   Problem: RH SAFETY Goal: RH STG ADHERE TO SAFETY PRECAUTIONS W/ASSISTANCE/DEVICE Description: STG Adhere to Safety Precautions With cues Assistance/Device. Outcome: Progressing   Problem: RH KNOWLEDGE DEFICIT Goal: RH STG INCREASE KNOWLEDGE OF DIABETES Description: Patient and dtr will be able to manage DM using educational resources for medications and dietary modification independently Outcome: Progressing Goal: RH STG INCREASE KNOWLEDGE OF HYPERTENSION Description: Patient and dtr will be able to manage HTN using educational resources for medications and dietary modification independently Outcome: Progressing Goal: RH STG INCREASE KNOWLEGDE OF HYPERLIPIDEMIA Description: Patient and dtr will be able to manage HLD using educational resources for medications and dietary modification independently Outcome: Progressing Goal: RH STG 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 Description: Patient and dtr will be able to manage secondary risks using educational resources for medications and dietary modification independently Outcome: Progressing   Problem: Education: Goal: Ability to describe self-care measures that may prevent or decrease complications (Diabetes Survival Skills Education) will improve Outcome: Progressing Goal: Individualized Educational Video(s) Outcome: Progressing   Problem: Coping: Goal: Ability to adjust to condition or change in health will improve Outcome: Progressing   Problem: Fluid Volume: Goal: Ability to maintain a balanced intake and output will improve Outcome: Progressing   Problem: Health Behavior/Discharge Planning: Goal: Ability to identify and utilize available resources and services will improve Outcome: Progressing Goal: Ability to manage health-related needs will improve Outcome: Progressing    Problem: Metabolic: Goal: Ability to maintain appropriate glucose levels will improve Outcome: Progressing   Problem: Nutritional: Goal: Maintenance of adequate nutrition will improve Outcome: Progressing Goal: Progress toward achieving an optimal weight will improve Outcome: Progressing   Problem: Skin Integrity: Goal: Risk for impaired skin integrity will decrease Outcome: Progressing   Problem: Tissue Perfusion: Goal: Adequacy of tissue perfusion will improve Outcome: Progressing

## 2023-11-24 NOTE — Patient Care Conference (Signed)
 Inpatient RehabilitationTeam Conference and Plan of Care Update Date: 11/24/2023   Time: 9:40 AM    Patient Name: [name]      Medical Record Number: [id number]  Date of Birth: 12/05/1940 Sex: Female         Room/Bed: 4M09C/4M09C-01 Payor Info: Payor: [employer]ICARE / Plan: DREMA DUAL COMPLETE / Product Type: *No Product type* /    Admit Date/Time:  11/09/2023  2:30 PM  Primary Diagnosis:  Acute right hemiparesis [facility])  Hospital Problems: Principal Problem:   Acute right hemiparesis (HCC) Active Problems:   Cerebellar stroke, acute (HCC)   Protein-calorie malnutrition, severe    Expected Discharge Date: Expected Discharge Date:  (Changed to SNF)  Team Members Present: Physician leading conference: Dr. Villasenor Collier Social Worker Present: Rhoda Clement, LCSW Nurse Present: Barnie Ronde, RN PT Present: [name], PT;[name] Green, PT OT Present: Michaelyn Seip, OT SLP Present: Blaise Alderman, SLP     Current Status/Progress Goal Weekly Team Focus  Bowel/Bladder   Pt is incontinent of bowel and bladder   Pt will regain continence of bowel and bladder   Provide assistance with  tolieting every 4 hrs while awake    Swallow/Nutrition/ Hydration   regular/thin MBS today   supervision  ongoing diet tolerance    ADL's   max to total   mod to max   balance, postural control, participation, alertness and fatigue, mobility, family education- SNF vs Home at bed level. daughter wants home at bed level . D/c 11/5?    Mobility   Max (+2 at times)-total A for all mobility and transfers   max A/mod A for WC  DC 11/5 SNF vs home; FU HHPT; Pt's overall progress toward goals limited by weakness, dense R hemiparesis, poor postural control, R UE/LE inattention, significant fatigue, and motivation. barriers to Dischare: pt family abiliyt to provide necessary assist, pt needs ramp for 2 step home entry.  Another family education planned for Thursday  and WC evaluation on Friday (if pt family opts to bring her home)    Communication   moderate dysarthria - impacted by lack of dentition   minA   increased use of speech intelligibility strategies    Safety/Cognition/ Behavioral Observations  moderate cognitive deficits in problem solving, STM, attention   minA   sustained attention, mildly complex problem solving, use of memory log    Pain   Pt complains of left neck and shoulder pain   Pt pain will be 3 or less on the pain scale   Assess pain level to ensure pain is 3 or less in the pain scale    Skin   Pt skin is intact   Ensure pt skin remains intact with no breakdown  Ensure pt remains clean and dry and pt is turned Q 2hrs for positioning, comfort and  skin integrity      Discharge Planning:  Met with daughter and son who are discussing discharge plan-made aware of 24/7 physical care. Palliative involved also. Family to get back with worker regarding plan home versus SNF   Team Discussion: Patient admitted post right frontal caudate parietal occipital and pontine CVA; bilateral watershed presentation, dysarthria, right inattention, poor spatial awareness, and right hemiparesis with a history of breast cancer/radiation therapy with NSTEMI. Chronic shoulder pain with new musculoskeletal radiation. RRT called 11/23/23, EKG completed. Functional progress limited by limited participation, poor balance, and emotional lability.  Patient on target to meet rehab goals: no, currently needs max - total  assist for ADLs and mobility using a hoyer lift and power chair.  *See Care Plan and progress notes for long and short-term goals.   Revisions to Treatment Plan:  Palliative Care following Dietary consult GI consult Downgraded goals to mod - max assist overall MBS/Esophogram 11/23/23; medications crushed in puree  Teaching Needs: Safety, medications, transfers, toileting, dietary modification, skin care/wound care, etc.    Current Barriers to Discharge: Decreased caregiver support, Home enviroment access/layout, and Incontinence  Possible Resolutions to Barriers: Family education DME: [employer] wheelchair, ramp for entry to home, [facility], hospital bed, etc HH follow up services Private pay caregivers to supplement family care  SNF recommended     Medical Summary Current Status: bilateral embolic shower, HTN, T2DM, Anemia, Shoulder pain, Azotemia, hyponatremia, malnutrtion  Barriers to Discharge: Electrolyte abnormality;Medical stability;Inadequate Nutritional Intake;Self-care education;Uncontrolled Pain  Barriers to Discharge Comments: bilateral embolic shower, HTN, T2DM, Anemia, Shoulder pain, Azotemia, hyponatremia, malnutrtion Possible Resolutions to [name] Focus: shoulder, monitor CBC/BMP, monitor BP, monitor glucose   Continued Need for Acute Rehabilitation Level of Care: The patient requires daily medical management by a physician with specialized training in physical medicine and rehabilitation for the following reasons: Direction of a multidisciplinary physical rehabilitation program to maximize functional independence : Yes Medical management of patient stability for increased activity during participation in an intensive rehabilitation regime.: Yes Analysis of laboratory values and/or radiology reports with any subsequent need for medication adjustment and/or medical intervention. : Yes   I attest that I was present, lead the team conference, and concur with the assessment and plan of the team.   [name] 11/24/2023, 3:50 PM

## 2023-11-24 NOTE — Progress Notes (Signed)
 1                                                        PROGRESS NOTE    Subjective/Complaints: Had L shoulder pain yesterday, cardiac workup completed and seen by rapid response to r/o cardiac.  Pain felt to be MSK related and she was given baclofen.  Had swallow study and BMS.    ROS: Limited due to cognitive/behavioral. Denies CP/SOB, Nausea, Vomiting + Painful swallowing - improved + L shoulder pain-continued  Objective:   DG ESOPHAGUS W SINGLE CM (SOL OR THIN BA) Result Date: 11/23/2023 CLINICAL DATA:  Inpatient being treated for recent CVA with c/o dysphagia. Exam limited by mobility and some views limited by kyphotic posture. EXAM: ESOPHAGUS/BARIUM SWALLOW/TABLET STUDY TECHNIQUE: Single contrast examination was performed using thin liquid barium. This exam was performed by Laymon Coast, NP, and was supervised and interpreted by Dr. Johann. Images obtained in LPO and RPO positioning. FLUOROSCOPY: Radiation Exposure Index (as provided by the fluoroscopic device): 15 mGy Kerma COMPARISON:  None Available. FINDINGS: Swallowing: Appears normal. No vestibular penetration or aspiration seen. Pharynx: Unremarkable. Esophagus: Normal appearance. Esophageal motility: Esophageal dysmotility seen as tertiary contractions resulting in contrast stasis within the mid and distal esophagus. Hiatal Hernia: Small sliding type hiatal hernia seen. Gastroesophageal reflux: None visualized. Ingested 13 mm barium tablet: Passed without significant delay to the stomach. Other: None. IMPRESSION: 1.  Esophageal dysmotility. 2.  Small sliding type hiatal hernia. Electronically Signed   By: JONETTA Johann M.D.   On: 11/23/2023 12:17   DG Swallowing Func-Speech Pathology Result Date: 11/23/2023 Table formatting from the original result was not included. Modified Barium Swallow Study Patient Details Name: [name] MRN: [id number] Date of Birth: 06/04/1940 Today's Date: 11/23/2023 HPI/PMH: HPI: [name] is  a 83 y.o. 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       CT of the head revealed large hypodensity on the right concerning for large territory infarct.  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  Stroke consistent with embolic.  Patient apparently missed Eliquis  doses. Echo shows EF 60%, increased RVSP at 36.9 mmHg, mild MR, moderate AR. Continue aspirin , statin.  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 S/p radiation, on anastrozole .She is at risk for strokes due to her cancer history. Therapies are recommending intensive rehab. Clinical Impression: Patient presents with a functional oropharyngeal swallow. Oral phase is remarkable for mild oral residuals and mildly prolonged mastication times (patient with missing dentition) however patient is able to clear w/ additional swallows or liquid wash. Pharyngeal phase is remarkable for complete epiglottic inversion, laryngeal vestibular closure and hyoid excursion. Noted x1 instance of trace penetration, however no other instances of penetration/aspiration across all consistencies tested. Full clearance of pharyngeal space. Patient unable to transit pill from oral cavity, therefore expectorated. Recommend continuation of D3/thin liquid diet with medications crushed in puree. Patient should receive physical and verbal assistance during meals to cue for head up posture. Factors  that may increase risk of adverse event in presence of aspiration Noe & Lianne 2021): No data recorded Recommendations/Plan: Swallowing  Evaluation Recommendations Swallowing Evaluation Recommendations Recommendations: PO diet PO Diet Recommendation: Dysphagia 3 (Mechanical soft); Thin liquids (Level 0) Liquid Administration via: Straw Medication Administration: Crushed with puree Supervision: Full assist for feeding Swallowing strategies  : Slow rate; Small bites/sips Postural changes: Position pt fully upright for meals Oral care recommendations: Oral care BID (2x/day) Treatment Plan Treatment Plan Treatment recommendations: Therapy as outlined in treatment plan below Follow-up recommendations: Follow physicians's recommendations for discharge plan and follow up therapies Functional status assessment: Patient has had a recent decline in their functional status and demonstrates the ability to make significant improvements in function in a reasonable and predictable amount of time. Treatment frequency: Min 2x/week Treatment duration: 2 weeks Interventions: Compensatory techniques; Patient/family education; Diet toleration management by SLP Recommendations Recommendations for follow up therapy are one component of a multi-disciplinary discharge planning process, led by the attending physician.  Recommendations may be updated based on patient status, additional functional criteria and insurance authorization. Assessment: Orofacial Exam: Orofacial Exam Oral Cavity - Dentition: Edentulous Orofacial Anatomy: WFL Anatomy: Anatomy: WFL Boluses Administered: Boluses Administered Boluses Administered: Thin liquids (Level 0); Mildly thick liquids (Level 2, nectar thick); Puree; Solid  Oral Impairment Domain: Oral Impairment Domain Lip Closure: Interlabial escape, no progression to anterior lip Tongue control during bolus hold: Cohesive bolus between tongue to palatal seal Bolus preparation/mastication: Slow prolonged chewing/mashing with complete recollection Bolus transport/lingual motion: Brisk tongue motion Oral residue: Trace residue lining oral structures  Location of oral residue : Tongue Initiation of pharyngeal swallow : Posterior laryngeal surface of the epiglottis  Pharyngeal Impairment Domain: Pharyngeal Impairment Domain Soft palate elevation: No bolus between soft palate (SP)/pharyngeal wall (PW) Laryngeal elevation: Complete superior movement of thyroid  cartilage with complete approximation of arytenoids to epiglottic petiole Anterior hyoid excursion: Complete anterior movement Epiglottic movement: Complete inversion Laryngeal vestibule closure: Complete, no air/contrast in laryngeal vestibule Pharyngeal stripping wave : Present - complete Pharyngeal contraction (A/P view only): N/A Pharyngoesophageal segment opening: Complete distension and complete duration, no obstruction of flow Tongue base retraction: No contrast between tongue base and posterior pharyngeal wall (PPW) Pharyngeal residue: Trace residue within or on pharyngeal structures  Esophageal Impairment Domain: Esophageal Impairment Domain Esophageal clearance upright position: Complete clearance, esophageal coating Pill: Pill Consistency administered: -- (expectorated) Penetration/Aspiration Scale Score: Penetration/Aspiration Scale Score 1.  Material does not enter airway: Puree; Solid; Mildly thick liquids (Level 2, nectar thick) 2.  Material enters airway, remains ABOVE vocal cords then ejected out: Thin liquids (Level 0) Compensatory Strategies: Compensatory Strategies Compensatory strategies: Yes Straw: Effective Effective Straw: Thin liquid (Level 0); Mildly thick liquid (Level 2, nectar thick)   General Information: No data recorded Diet Prior to this Study: Dysphagia 3 (mechanical soft); Thin liquids (Level 0)   No data recorded  Respiratory Status: WFL   Supplemental O2: None (Room air)   No data recorded Behavior/Cognition: Alert; Lethargic/Drowsy Self-Feeding Abilities: Able to self-feed Baseline vocal quality/speech: Normal Volitional Cough: Able to elicit Volitional Swallow: Able to  elicit Exam Limitations: Poor positioning Goal Planning: Prognosis for improved oropharyngeal function: Good No data recorded No data recorded No data recorded Consulted and agree with results and recommendations: Patient; Nurse Pain: No data recorded End of Session: Start Time:No data recorded Stop Time: No data recorded Time Calculation:No data recorded Charges: No data recorded SLP visit diagnosis: SLP Visit Diagnosis: Dysphagia, unspecified (R13.10) Past Medical History: Past  Medical History: Diagnosis Date  Acute bronchitis 12/17/2021  Acute cystitis 02/20/2019  Arthritis   Atrial flutter (HCC)   Breast cancer (HCC)   left  2009 and 2025  Diabetes mellitus without complication (HCC)   Dyspnea   mostly with exertion  GERD (gastroesophageal reflux disease)   History of radiation therapy   Left Lung-08/09/23-10/01/23- Dr. 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[facility])   Lupus   Pneumonia 07/31/2021 Past Surgical History: Past Surgical History: Procedure Laterality Date  BREAST BIOPSY Left 01/15/2023  US  LT BREAST BX W LOC DEV 1ST LESION IMG BX SPEC US  GUIDE 01/15/2023 [facility] MAMMOGRAPHY  BREAST LUMPECTOMY Left 2009  BRONCHIAL BIOPSY  06/29/2023  Procedure: BRONCHOSCOPY, WITH BIOPSY;  Surgeon: [name], MD;  Location: [facility] ENDOSCOPY;  Service: Pulmonary;;  BRONCHIAL BRUSHINGS  06/29/2023  Procedure: BRONCHOSCOPY, WITH BRUSH BIOPSY;  Surgeon: [name], MD;  Location: [facility] ENDOSCOPY;  Service: Pulmonary;;  BRONCHIAL NEEDLE ASPIRATION BIOPSY  06/29/2023  Procedure: BRONCHOSCOPY, WITH NEEDLE ASPIRATION BIOPSY;  Surgeon: [name], MD;  Location: [facility] ENDOSCOPY;  Service: Pulmonary;;  IR 3D INDEPENDENT WKST  05/29/2021  IR ANGIO INTRA EXTRACRAN SEL COM CAROTID INNOMINATE BILAT MOD SED  05/29/2021  IR ANGIO VERTEBRAL SEL VERTEBRAL BILAT MOD SED  05/29/2021  LEFT HEART CATH AND CORONARY ANGIOGRAPHY N/A 11/01/2023  Procedure: LEFT HEART CATH AND CORONARY ANGIOGRAPHY;  Surgeon: Wonda Sharper, MD;  Location: [facility] INVASIVE CV LAB;  Service: Cardiovascular;  Laterality: N/A;  SIMPLE MASTECTOMY WITH AXILLARY SENTINEL NODE BIOPSY Left 03/31/2023  Procedure: LEFT MASTECTOMY;  Surgeon: Vernetta Berg, MD;  Location: [facility] OR;  Service: General;  Laterality: Left;  LMA PEC BLOCK  VIDEO BRONCHOSCOPY WITH ENDOBRONCHIAL NAVIGATION Left 06/29/2023  Procedure: VIDEO BRONCHOSCOPY WITH ENDOBRONCHIAL NAVIGATION;  Surgeon: [name], MD;  Location: [facility] ENDOSCOPY;  Service: Pulmonary;  Laterality: Left;  VIDEO BRONCHOSCOPY WITH ENDOBRONCHIAL ULTRASOUND Bilateral 06/29/2023  Procedure: BRONCHOSCOPY, WITH EBUS;  Surgeon: [name], MD;  Location[facility] ENDOSCOPY;  Service: Pulmonary;  Laterality: Bilateral; Cassidi F Sockwell 11/23/2023, 10:22 AM   Recent Labs    11/23/23 0502 11/24/23 0454  WBC 6.5 6.0  HGB 8.2* 8.1*  HCT 24.2* 24.3*  PLT 132* 132*    Recent Labs    11/23/23 0502 11/24/23 0454  NA 130* 130*  K 4.3 4.3  CL 102 101  CO2 20* 20*  GLUCOSE 110* 130*  BUN 18 17  CREATININE 0.96 1.05*  CALCIUM  9.2 9.3     Intake/Output Summary (Last 24 hours) at 11/24/2023 0940 Last data filed at 11/24/2023 0900 Gross per 24 hour  Intake 320 ml  Output --  Net 320 ml        Physical Exam: Vital Signs Blood pressure (!) 128/50, pulse 76, temperature 98.9 F (37.2 C), resp. rate 17, height 5' 6.5 (1.689 m), weight (S) 53.8 kg, SpO2 100%.   Constitutional: No distress . Vital signs reviewed.  Sitting up in bed. Appears chronically ill but comfortable at the moment.  HEENT: NCAT, EOMI, oral membranes moist. Neck: supple Cardiovascular: RRR without murmur. No JVD    Respiratory/Chest: CTA Bilaterally without wheezes or rales. Normal effort    GI/Abdomen: BS +, non-tender, non-distended Ext: no clubbing, cyanosis, or edema Psych: flat but cooperative  Skin: Clean and intact without signs of breakdown  Neuro: Awake and alert, delayed responses.  Minimal verbal output.  R  inattention  Decreased sensation light touch right lower extremity.  Decreased to  light touch on RLE< but not RUE or face  RUE- tr/5 ff x1, unable to repeat; 0/5 otherwise  LUE 4+/5 throughout RLE- HF 2/5; KE/KF 2/5; DF 0/5; PF 2-/5 LLE- 4+/5 throughout  Tone: MAS 2 right shoulder, MAS 3 right elbow, MAS 0 wrists, fingers, MAS 2 knee flexors  Prior neuro assessment is c/w today's exam 11/24/2023.   Musculoskeletal: No significant neck tenderness or pain with flexion extension of the neck noted.  No significant periscapular muscle tenderness noted today, Tender L anterior and posterior shoulder, pain with L shoulder ROM     Assessment/Plan: 1. Functional deficits which require 3+ hours per day of interdisciplinary therapy in a comprehensive inpatient rehab setting. Physiatrist is providing close team supervision and 24 hour management of active medical problems listed below. Physiatrist and rehab team continue to assess barriers to discharge/monitor patient progress toward functional and medical goals  Care Tool:  Bathing    Body parts bathed by patient: Right arm, Left arm, Chest, Abdomen, Front perineal area   Body parts bathed by helper: Abdomen, Chest, Front perineal area, Buttocks, Right upper leg, Left upper leg, Right lower leg, Left lower leg, Face     Bathing assist Assist Level: Total Assistance - Patient < 25%     Upper Body Dressing/Undressing Upper body dressing   What is the patient wearing?: Hospital gown only, Pull over shirt    Upper body assist Assist Level: Total Assistance - Patient < 25%    Lower Body Dressing/Undressing Lower body dressing      What is the patient wearing?: Underwear/pull up     Lower body assist Assist for lower body dressing: Total Assistance - Patient < 25%     Toileting Toileting    Toileting assist Assist for toileting: Total Assistance - Patient < 25%     Transfers Chair/bed transfer  Transfers assist  Chair/bed  transfer activity did not occur: Safety/medical concerns (fatigue)  Chair/bed transfer assist level: Maximal Assistance - Patient 25 - 49%     Locomotion Ambulation   Ambulation assist   Ambulation activity did not occur: Safety/medical concerns          Walk 10 feet activity   Assist  Walk 10 feet activity did not occur: Safety/medical concerns        Walk 50 feet activity   Assist Walk 50 feet with 2 turns activity did not occur: Safety/medical concerns         Walk 150 feet activity   Assist Walk 150 feet activity did not occur: Safety/medical concerns         Walk 10 feet on uneven surface  activity   Assist Walk 10 feet on uneven surfaces activity did not occur: Safety/medical concerns         Wheelchair     Assist Is the patient using a wheelchair?: Yes Type of Wheelchair: Manual (TIS)    Wheelchair assist level: Dependent - Patient 0%      Wheelchair 50 feet with 2 turns activity    Assist        Assist Level: Dependent - Patient 0%   Wheelchair 150 feet activity     Assist      Assist Level: Dependent - Patient 0%   Blood pressure (!) 128/50, pulse 76, temperature 98.9 F (37.2 C), resp. rate 17, height 5' 6.5 (1.689 m), weight (S) 53.8 kg, SpO2 100%.  Medical Problem List and Plan: 1. Functional deficits secondary to  bilateral embolic shower, embolic  pattern, etiology:  Afib and missed Eliquis  doses vs from NSTEMI with cardiac cath.              -patient may  shower             -ELOS/Goals: 12-15 days min A-              Cannot sit upright without propping            -Continue CIR therapies including PT, OT, and SLP   - Ordered PRAFO/WHO right upper extremity, sling for right upper extremity also ordered  -Will plan for palliative consult for goals of care - notified daughter and pt, pt seen yesterday, appreciate assistance  - Recheck CBC and BMP today due to reports of fatigue but overall appears around  her baseline, Check CXR- later in afternoon pt reported to nursing/therapy she had cough in AM that resolved, per therapy patient reporting she feels she may have pneumonia - says she's had it before and feels like it's the same , Will add U/A  -possibly mood related?, she had recent goals of care discussion   10-25: Chest x-ray unchanged, increasingly hyponatremic.  Increase IV fluids to 75 cc/h, urinalysis pending.  10-26: Lethargy improving, more engaged today. 11-22-26 lethargy remains improved(back to basline), consider SNF recommendation  Team conference today please see physician documentation under team conference tab, met with team  to discuss problems,progress, and goals. Formulized individual treatment plan based on medical history, underlying problem and comorbidities.     2.  Antithrombotics: -DVT/anticoagulation:  Pharmaceutical: Eliquis  Transitioned from IV Heparin  to Eliquis  10/14             -antiplatelet therapy: Asprin 81 mg  3. Pain Management: Gabapentin  and Tylenol  as needed  -Voltaren  R shoulder for R shoulder pain  - 10/17 patient now reporting some pain in her left shoulder, suspect from increased use with therapy.  Right shoulder doing better.  Will try Voltaren  gel to left shoulder, consider imaging if does not improve  10/18 added kpad for neck/shoulder as well  -10/22-23 Continue voltaren  gel for occasional shoulder pain  - Can try K-pad for neck as she has some soreness this morning  11-19-25 no complaints of pain  10/28, intermittent shoulder pain, had pain yesterday and had rapid response called yesterday to r/o cardiac event and had EKG/Trop, was found to have pain in trapezius muscle. Xray L shoulder, try lidocaine  patch  4. Mood/Behavior/Sleep: LCSW to follow for evaluation and support when available.              -antipsychotic agents: N/A 5. Neuropsych/cognition: This patient is not quite capable of making decisions on her own behalf. 6. Skin/Wound  Care: routine pressure relief measures.  7. Fluids/Electrolytes/Nutrition: monitor intake and output. Follow up labs in a.m.              - SLP consult: Carb modified diet + Ensure  -nutrition consult for decreased p.o. intake  10/18 intake remains minimal. RD following   -added megace for appetitte  10/19 obsv today. Recheck labs, prealbumin in AM   10/20 Prealbumin 21 this AM, continue to monitor PO intake  10/22 Hx of pain with swallowing, appears has been chronic for several, nursing reports daughter said yesterday she was seen by GI in the past. Started PPI  10/23 called GI for consult- she has been seen by [facility] outpatient  10/24 Seen by GI appreciate- Barium swallow, Sucralfate   She ate 40-50% meals  yesterday, poor intake but a little better  11-19-25: Eating 25 to 50% of meals  Seen by nutrition- for severe malnutrition, appreciate assistance  10/29 poor po intake continues, continue to encouarge  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 Continue aspirin  and statin.   9. CAD: 10/4-10/7 admitted for NSTEMI  10. HLD: Lipitor 40 mg  11. HTN: monitor blood pressures on  Hyzaar, Cardizem , and Aldactone      11/24/2023    5:19 AM 11/23/2023   10:03 PM 11/23/2023    4:00 PM  Vitals with BMI  Systolic 128 130 866  Diastolic 50 49 50  Pulse 76 84 79  - 10/27-29 BP stable, continue current  12. Paroxymal Atrial Flutter/Fib: continue Eliquis  2.5 bid  13. T2DM: HgbA1c 6.0, resumed home Amaryl   - monitor cbg AC/HS with SSI.  CBG (last 3)  Recent Labs    11/23/23 1656 11/23/23 2205 11/24/23 0518  GLUCAP 104* 148* 130*  - 10/15 Will order CBG/SSI  - CBGs well-controlled, holding amaryl . But not eating much  -10/20-29 controlled, continue current regimen, continued poor PO intake  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  Hypothyroidism: Synthroid  17.  Chronic anemia: Stable,  monitor CBC.   -10/17 Hgb improved to 10 yesterday  -10/18 lower HGB at 8.4 suspect dilutional due to IVF, recheck tomorrow  - 10-26: Hemoglobin from 10-8.2.  May be dilutional due to IV fluids, will get iron studies and FOBT to evaluate --FOBT positive, hesitant to hold [facility] given recent embolic strokes, will increase GI prophylaxis to 40 mg twice daily and defer AC discussion to primary team.  Trend hemoglobin. 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  Continue to monitor  -10/20 NA 131 today, continue to monitor   -10/23 NA stable 131  10-25: Sodium 128 yesterday, repeat today similar.  Serum osmole's within normal limits, increasing IV fluids from 50 to 75 cc/h overnight and repeat labs in AM.  Placed 1200 cc p.o. fluid restriction.  Urine studies pending.  10-26: Sodium remains 128 despite IV fluids; urine/serum osmole's indicative of SIADH. ?  Due to lung pathology, DC trazodone , DC IV fluids, start salt tabs 1 g 2 times daily and repeat labs in AM.  10/29 NA stable 130, continue current   21. Azotemia  -10/20 IVF NS 50ml/hr  -10/23 BUN and CR improved, continue IVF at night for poor oral intake, can hold if drinks 1L  10-25: Creatinine increased to 1.02; increasing IV fluids as above  10-26: Creatinine 0.94, DC IV fluids due to SIADH as above.  -10/27 Cr/BUN  trending up as IVF stopped, recheck tomorrow  -10/29 Bun creatinine overall stable  22.  Urinary incontinence/UTI  - 10-25 urinalysis mildly positive; start Keflex  500 mg twice daily for 7 days; follow sensitivities- Ecoli sensitive  to cefazolin - continue current regimen  23.  Right hemiparesis/spasticity  - 10-26: Due to cognitive issues and lethargy, hesitant to schedule muscle relaxers.  Start baclofen 5 mg 3 times  daily as needed.  -Continue PRN as above to avoid sedation  LOS: 15 days A FACE TO FACE EVALUATION WAS PERFORMED  Smaldone Collier 11/24/2023, 9:40 AM

## 2023-11-24 NOTE — Plan of Care (Signed)
 DC goals due to safety concerns.  Problem: RH Balance Goal: LTG Patient will maintain dynamic standing balance (PT) Description: LTG:  Patient will maintain dynamic standing balance with assistance during mobility activities (PT) Outcome: Not Applicable   Problem: RH Car Transfers Goal: LTG Patient will perform car transfers with assist (PT) Description: LTG: Patient will perform car transfers with assistance (PT). Outcome: Not Applicable   Problem: RH Ambulation Goal: LTG Patient will ambulate in controlled environment (PT) Description: LTG: Patient will ambulate in a controlled environment, # of feet with assistance (PT). Outcome: Not Applicable   Problem: RH Stairs Goal: LTG Patient will ambulate up and down stairs w/assist (PT) Description: LTG: Patient will ambulate up and down # of stairs with assistance (PT) Outcome: Not Applicable

## 2023-11-24 NOTE — Plan of Care (Signed)
 Downgraded goals from min A to max A overall due to slower than anticipated progress.  Problem: Sit to Stand Goal: LTG:  Patient will perform sit to stand with assistance level (PT) Description: LTG:  Patient will perform sit to stand with assistance level (PT) Flowsheets (Taken 11/24/2023 0755) LTG: PT will perform sit to stand in preparation for functional mobility with assistance level: Maximal Assistance - Patient 25 - 49%   Problem: RH Bed to Chair Transfers Goal: LTG Patient will perform bed/chair transfers w/assist (PT) Description: LTG: Patient will perform bed to chair transfers with assistance (PT). Flowsheets (Taken 11/24/2023 0755) LTG: Pt will perform Bed to Chair Transfers with assistance level: Maximal Assistance - Patient 25 - 49%   Problem: RH Wheelchair Mobility Goal: LTG Patient will propel w/c in controlled environment (PT) Description: LTG: Patient will propel wheelchair in controlled environment, # of feet with assist (PT) Flowsheets (Taken 11/24/2023 0755) LTG: Pt will propel w/c in controlled environ  assist needed:: Moderate Assistance - Patient 50 - 74% Goal: LTG Patient will propel w/c in home environment (PT) Description: LTG: Patient will propel wheelchair in home environment, # of feet with assistance (PT). Flowsheets (Taken 11/24/2023 0755) LTG: Pt will propel w/c in home environ  assist needed:: Moderate Assistance - Patient 50 - 74%

## 2023-11-24 NOTE — Patient Care Conference (Incomplete)
 Inpatient RehabilitationTeam Conference and Plan of Care Update Date: 11/24/2023   Time: 4:27 AM    Patient Name: [name]      Medical Record Number: [id number]  Date of Birth: 04-01-1940 Sex: Female         Room/Bed: 4M09C/4M09C-01 Payor Info: Payor: [employer] MEDICARE / Plan: DREMA DUAL COMPLETE / Product Type: *No Product type* /    Admit Date/Time:  11/09/2023  2:30 PM  Primary Diagnosis:  Acute right hemiparesis [facility])  Hospital Problems: Principal Problem:   Acute right hemiparesis (HCC) Active Problems:   Cerebellar stroke, acute (HCC)   Protein-calorie malnutrition, severe    Expected Discharge Date: Expected Discharge Date: 12/01/23  Team Members Present:       Current Status/Progress Goal Weekly Team Focus  Bowel/Bladder      ***          Swallow/Nutrition/ Hydration   regular/thin MBS today  *** supervision  ongoing diet tolerance    ADL's      ***          Mobility   Max-total A for all mobility and transfers  *** min A from WC level - need to revise?  DC 11/5 SNF vs home; FU HHPT; Pt's overall progress toward goals limited by weakness, dense R hemiparesis, poor postural control, R UE/LE inattention, significant fatigue, and motivation. Another family education planned for Thursday and [facility] evaluation on Friday?    Communication   moderate dysarthria - impacted by lack of dentition  *** minA   increased use of speech intelligibility strategies    Safety/Cognition/ Behavioral Observations  moderate cognitive deficits in problem solving, STM, attention  *** minA   sustained attention, mildly complex problem solving, use of memory log    Pain      ***          Skin      ***           Discharge Planning:  Met with daughter and son who are discussing discharge plan-made aware of 24/7 physical care. Palliative involved also. Family to get back with worker regarding plan home versus SNF   Team  Discussion: *** Patient on target to meet rehab goals: {IP REHAB YES/NO WITH TPOIRJMID:75863}  *See Care Plan and progress notes for long and short-term goals.   Revisions to Treatment Plan:  ***  Teaching Needs: ***  Current Barriers to Discharge: {BARRIERS TO IPDRYJMHZ:75864}  Possible Resolutions to Barriers: ***     Medical Summary               I attest that I was present, lead the team conference, and concur with the assessment and plan of the team.   Randine JONETTA Stevens GLENWOOD Con 11/24/2023, 4:27 AM

## 2023-11-24 NOTE — Progress Notes (Signed)
 Physical Therapy Session Note  Patient Details  Name: [name] MRN: [id number] Date of Birth: 17-Dec-1940   Today's Date: 11/24/2023 PT Individual Time: 0800-0857 PT Individual Time Calculation (min): 57 min   Short Term Goals: Week 3:  PT Short Term Goal 1 (Week 3): STG=LTG due to ELOS  Skilled Therapeutic Interventions/Progress Updates:      Pt supine/hunched over toward R side in bed with HOB elevated upon arrival. Pt appeared very fatigued, reported pain in her L shoulder (unspecified rating), but agreeable to therapy. Initially pt would only respond to questions with head nods - max cueing required to get a verbal response (due to fatigue?). PT spoke to MD and obtained clearance to proceed with therapy due to elevated troponins. Pt incontinent of bladder in brief and on sacral dressing. Despite max verbal cueing and encouragement, pt required max A (+2) for rolling in bed in order for PT to change sacral dressing and brief. Very minimal effort demonstrated by patient, therefore, pt required dependent Maximove transfer from standard hospital bed to air mattress bed. Safety plan changed to Maximove for nursing. Once transfer completed, PT performed manual stretching of R ankle, hand, and elbow due to increased stiffness, for contracture prevention/reduction. Pt remained in supine position and all needs within reach upon PT exiting room.  Therapy Documentation Precautions:  Precautions Precautions: Fall Recall of Precautions/Restrictions: Intact Precaution/Restrictions Comments: SBP <180, R hemiparesis Required Braces or Orthoses: Other Brace Other Brace: R LE PRAFO, R UE sling, R UE resting hand splint Restrictions Weight Bearing Restrictions Per Provider Order: No  Therapy/Group: Individual Therapy  [name], DPT 11/24/2023, 7:23 AM

## 2023-11-24 NOTE — Progress Notes (Signed)
 Occupational Therapist participated in the interdisciplinary team conference, providing clinical information regarding the patient's current status, treatment goals, and weekly focus, including any barriers that need to be addressed. Please see the Inpatient Rehabilitation Team Conference and Plan of Care Update for further details.   [name] OTR/L

## 2023-11-24 NOTE — NC FL2 (Signed)
 [place]  MEDICAID FL2 LEVEL OF CARE FORM     IDENTIFICATION  Patient Name: [name] Birthdate: [birth year] Sex: female Admission Date (Current Location): 11/09/2023  [place] and [place] Number:  Lloyd [id number] Facility and Address:  [facility], [address] [place], [place] [address]      Provider Number: [id number]  Attending Physician Name and Address:  Urbano Albright, MD  Relative Name and Phone Number:  [name] Clonts [phone]    Current Level of Care: Other (Comment) (Rehab) Recommended Level of Care: Skilled Nursing Facility Prior Approval Number:    Date Approved/Denied:   PASRR Number: [id number]  Discharge Plan: SNF    Current Diagnoses: Patient Active Problem List   Diagnosis Date Noted   Protein-calorie malnutrition, severe 11/19/2023   Acute right hemiparesis (HCC) 11/06/2023   Normocytic anemia 11/06/2023   History of lung cancer 11/06/2023   Cerebellar stroke, acute (HCC) 11/06/2023   NSTEMI (non-ST elevated myocardial infarction) (HCC) 10/30/2023   Allergic rhinitis due to pollen 10/19/2023   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   Abnormal gait 10/19/2023   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   Bloating 10/19/2023   Centrilobular emphysema (HCC) 10/19/2023   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   Diabetic mononeuropathy associated with type 2 diabetes mellitus (HCC) 10/19/2023   Family history of colon cancer in father 10/19/2023   Flatulence, eructation and gas pain 10/19/2023   Gastroesophageal reflux disease 10/19/2023   History of colonic polyps 10/19/2023   High glucose level 10/19/2023   Headache disorder 10/19/2023   Hyperglycemia due to type 2 diabetes mellitus (HCC) 10/19/2023   History of endocarditis in adulthood 10/19/2023   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/19/2023   Iron deficiency anemia 10/19/2023   Parietoalveolar pneumopathy (HCC) 10/19/2023    Osteoarthritis 10/19/2023   Neuropathy 10/19/2023   Personal history of malignant neoplasm of breast 10/19/2023   Myopathy, unspecified 10/19/2023   Pure hypercholesterolemia 10/19/2023   Stage 3a chronic kidney disease (HCC) 10/19/2023   Traumatic plantar fasciitis 10/19/2023   Atherosclerosis of coronary artery 10/19/2023   CAD S/P percutaneous coronary angioplasty 10/19/2023   Chronic systolic heart failure (HCC) 10/19/2023   Type 2 diabetes mellitus with other specified complication (HCC) 10/19/2023   Adverse effect of antihyperlipidemic and antiarteriosclerotic drugs, initial encounter 10/19/2023   Adverse effect of antihyperlipidemic drug 10/19/2023   Family history of malignant neoplasm of gastrointestinal tract 10/19/2023   Shortness of breath 10/19/2023   Encounter for screening for malignant neoplasm of colon 10/19/2023   Decreased estrogen level 10/19/2023   Elevated TSH 10/19/2023   Acquired hypothyroidism 10/19/2023   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   Systemic lupus erythematosus (HCC) 10/19/2023   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) 10/19/2023   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) 10/19/2023   Malignant neoplasm of lung (HCC) 10/19/2023   Essential hypertension 10/19/2023   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) 07/22/2023   Mediastinal adenopathy 06/29/2023   Lung mass 06/20/2023   Pulmonary embolism (HCC) 06/19/2023   S/P left mastectomy 03/31/2023   Genetic testing 03/10/2023   Family history of malignant neoplasm of breast 02/26/2023   Family history of malignant neoplasm of prostate 02/26/2023   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) 02/18/2023   Long term (current) use of anticoagulants 10/13/2022   Sepsis due to pneumonia (HCC) 10/11/2022   AKI (acute kidney injury) 10/11/2022   Paroxysmal atrial flutter (HCC) 10/11/2022  Diarrhea 10/11/2022    Controlled type 2 diabetes mellitus without complication, without long-term current use of insulin  (HCC) 10/11/2022   Mixed hyperlipidemia 10/11/2022   Atherosclerosis of aorta 10/11/2022   Diabetes mellitus type 2, noninsulin dependent (HCC) 10/11/2022   Benign hypertension 10/11/2022   Acute febrile illness 10/11/2022   Atrial flutter with rapid ventricular response (HCC) 10/11/2022   Chronic diastolic CHF (congestive heart failure) (HCC) 10/11/2022   Syncope 04/25/2022   Near syncope 02/18/2019   Hyponatremia 02/18/2019   ILD (interstitial lung disease) (HCC) 07/30/2016   Dyspnea 07/16/2016   Bibasilar crackles 07/16/2016   History of systemic lupus erythematosus (SLE) (HCC) 07/16/2016   Cough 07/16/2016   Encounter for monitoring ACE-inhibitor therapy 07/16/2016   History of asthma 07/16/2016   Hypothyroidism 01/03/2015   Primary hypertension 01/03/2015   UTI (urinary tract infection) 01/03/2015   Drug-induced hypersensitivity reaction 01/03/2015   Lupus 01/03/2015   Pyrexia 01/02/2015    Orientation RESPIRATION BLADDER Height & Weight     Self, Time, Situation, Place  Normal Incontinent Weight: (S) 118 lb 9.7 oz (53.8 kg) (5.7 pounds loss in weight since last weight checked) Height:  5' 6.5 (168.9 cm)  BEHAVIORAL SYMPTOMS/MOOD NEUROLOGICAL BOWEL NUTRITION STATUS      Incontinent Diet (regular thin liquids)  AMBULATORY STATUS COMMUNICATION OF NEEDS Skin   Total Care Verbally Normal                       Personal Care Assistance Level of Assistance  Total care       Total Care Assistance: Maximum assistance   Functional Limitations Info  Speech     Speech Info: Impaired    SPECIAL CARE FACTORS FREQUENCY  PT (By licensed PT), OT (By licensed OT), Bowel and bladder program, Speech therapy     PT Frequency: 5x week OT Frequency: 5x week Bowel and Bladder Program Frequency: Timed tolieting and bowel program   Speech Therapy Frequency: 3-5 x week       Contractures Contractures Info: Not present    Additional Factors Info  Code Status, Allergies Code Status Info: Full Code Allergies Info: Lisinopril , Ofev , Crestor , Ciprofloxacin           Current Medications (11/24/2023):  This is the current hospital active medication list Current Facility-Administered Medications  Medication Dose Route Frequency Provider Last Rate Last Admin   acetaminophen  (TYLENOL ) tablet 325-650 mg  325-650 mg Oral Q4H PRN Jerilynn Daphne SAILOR, NP   650 mg at 11/23/23 1452   albuterol  (PROVENTIL ) (2.5 MG/3ML) 0.083% nebulizer solution 2.5 mg  2.5 mg Nebulization Q6H PRN Lawrence, Brandi N, NP       alum & mag hydroxide-simeth (MAALOX/MYLANTA) 200-200-20 MG/5ML suspension 30 mL  30 mL Oral Q4H PRN Lawrence, Brandi N, NP   30 mL at 11/16/23 2055   anastrozole  (ARIMIDEX ) tablet 1 mg  1 mg Oral Daily Jerilynn Daphne SAILOR, NP   1 mg at 11/24/23 9052   apixaban  (ELIQUIS ) tablet 2.5 mg  2.5 mg Oral BID Jerilynn Daphne SAILOR, NP   2.5 mg at 11/24/23 0740   aspirin  EC tablet 81 mg  81 mg Oral Daily Jerilynn Daphne SAILOR, NP   81 mg at 11/24/23 0740   atorvastatin  (LIPITOR) tablet 40 mg  40 mg Oral Daily Lawrence, Brandi N, NP   40 mg at 11/24/23 0741   baclofen (LIORESAL) tablet 5 mg  5 mg Oral TID PRN Engler, Morgan C, DO   5 mg at  11/23/23 1542   bisacodyl (DULCOLAX) suppository 10 mg  10 mg Rectal Daily PRN Lawrence, Brandi N, NP   10 mg at 11/15/23 1845   cephALEXin  (KEFLEX ) capsule 500 mg  500 mg Oral Q12H Engler, Morgan C, DO   500 mg at 11/24/23 0740   diclofenac  Sodium (VOLTAREN ) 1 % topical gel 2 g  2 g Topical QID Urbano Albright, MD   2 g at 11/24/23 1252   diltiazem  (CARDIZEM  CD) 24 hr capsule 120 mg  120 mg Oral Daily Lawrence, Brandi N, NP   120 mg at 11/24/23 9051   diphenhydrAMINE  (BENADRYL ) capsule 25 mg  25 mg Oral Q6H PRN Jerilynn Daphne SAILOR, NP       escitalopram (LEXAPRO) tablet 10 mg  10 mg Oral Daily Urbano Albright, MD   10 mg at 11/24/23 1248    feeding supplement (ENSURE PLUS HIGH PROTEIN) liquid 237 mL  237 mL Oral BID BM Urbano Albright, MD   237 mL at 11/24/23 1049   gabapentin  (NEURONTIN ) capsule 300 mg  300 mg Oral TID Jerilynn Daphne SAILOR, NP   300 mg at 11/24/23 1248   Gerhardt's butt cream   Topical BID Urbano Albright, MD   Given at 11/24/23 0747   guaiFENesin -dextromethorphan  (ROBITUSSIN DM) 100-10 MG/5ML syrup 5-10 mL  5-10 mL Oral Q6H PRN Jerilynn Daphne SAILOR, NP   10 mL at 11/10/23 2041   hydroxychloroquine  (PLAQUENIL ) tablet 200 mg  200 mg Oral Daily Jerilynn Daphne SAILOR, NP   200 mg at 11/24/23 9052   insulin  aspart (novoLOG ) injection 0-6 Units  0-6 Units Subcutaneous TID WC Urbano Albright, MD   1 Units at 11/22/23 1312   levothyroxine  (SYNTHROID ) tablet 100 mcg  100 mcg Oral Daily Jerilynn Daphne SAILOR, NP   100 mcg at 11/24/23 0558   lidocaine  (LIDODERM ) 5 % 1 patch  1 patch Transdermal Q24H Urbano Albright, MD   1 patch at 11/24/23 1251   magnesium  hydroxide (MILK OF MAGNESIA) suspension 15 mL  15 mL Oral Daily PRN Lovorn, Megan, MD   15 mL at 11/15/23 0539   megestrol (MEGACE) 400 MG/10ML suspension 400 mg  400 mg Oral BID Swartz, Zachary T, MD   400 mg at 11/24/23 0740   multivitamin liquid 15 mL  15 mL Oral Daily Urbano Albright, MD   15 mL at 11/24/23 0740   oxymetazoline (AFRIN) 0.05 % nasal spray 1 spray  1 spray Each Nare BID PRN Street, [place], PA-C   1 spray at 11/23/23 9377   pantoprazole  (PROTONIX ) EC tablet 40 mg  40 mg Oral BID AC Engler, Morgan C, DO   40 mg at 11/24/23 0740   polyethylene glycol (MIRALAX  / GLYCOLAX ) packet 17 g  17 g Oral Daily Lovorn, Megan, MD   17 g at 11/24/23 0740   prochlorperazine (COMPAZINE) tablet 5-10 mg  5-10 mg Oral Q6H PRN Jerilynn Daphne SAILOR, NP       Or   prochlorperazine (COMPAZINE) suppository 12.5 mg  12.5 mg Rectal Q6H PRN Jerilynn Daphne SAILOR, NP       Or   prochlorperazine (COMPAZINE) injection 5-10 mg  5-10 mg Intravenous Q6H PRN Jerilynn Daphne SAILOR, NP        senna-docusate (Senokot-S) tablet 1 tablet  1 tablet Oral QHS PRN Lawrence, Brandi N, NP   1 tablet at 11/13/23 2010   sodium chloride  tablet 1 g  1 g Oral BID WC Emeline Search C, DO   1 g at 11/24/23 0740  sucralfate  (CARAFATE ) 1 GM/10ML suspension 1 g  1 g Oral TID WC & HS Kriss Stagger H, DO   1 g at 11/24/23 1248     Discharge Medications: Please see discharge summary for a list of discharge medications.  Relevant Imaging Results:  Relevant Lab Results:   Additional Information SSN: [id number]  Leonard Feigel, [place] MATSU, LCSW

## 2023-11-24 NOTE — Progress Notes (Signed)
 Patient ID: [name], female   DOB: 1940/06/05, 83 y.o.   MRN: [id number] Met with pt and called daughter-[name] to discuss the discharge plan. She reports she and Mom feel going to a SNF for rehab and only short term will be the best option. She wants a list of facilities and she wants to research them. Daughter to meet with palliative care today when here. Will leave a SNF list for [name] in the room.

## 2023-11-24 NOTE — Progress Notes (Signed)
 Physical Therapist participated in the interdisciplinary team conference, providing clinical information regarding the patient's current status, treatment goals, and weekly focus, including any barriers that need to be addressed. Please see the Inpatient Rehabilitation Team Conference and Plan of Care Update for further details.     Comer [place]ines, DPT

## 2023-11-24 NOTE — Plan of Care (Signed)
 Downgraded from min A to max A due to slower than anticipated progress.  Problem: RH Balance Goal: LTG Patient will maintain dynamic sitting balance (PT) Description: LTG:  Patient will maintain dynamic sitting balance with assistance during mobility activities (PT) Outcome: Not Progressing Flowsheets (Taken 11/24/2023 0755) LTG: Pt will maintain dynamic sitting balance during mobility activities with:: Maximal Assistance - Patient 25 - 49%

## 2023-11-24 NOTE — Progress Notes (Signed)
 Occupational Therapy Session Note  Patient Details  Name: [name] MRN: [id number] Date of Birth: January 08, 1941  Today's Date: 11/24/2023 OT Individual Time: 1400-1430 OT Individual Time Calculation (min): 30 min    Short Term Goals: Week 2:  OT Short Term Goal 1 (Week 2): patient will demonstrate toilet transfers with maxA OT Short Term Goal 1 - Progress (Week 2): Progressing toward goal OT Short Term Goal 2 (Week 2): patient will complete LB donning with max A of 1 OT Short Term Goal 2 - Progress (Week 2): Progressing toward goal OT Short Term Goal 3 (Week 2): patient be able to complete static standing in prep for ADLS with max A OT Short Term Goal 3 - Progress (Week 2): Progressing toward goal  Skilled Therapeutic Interventions/Progress Updates:      Therapy Documentation Precautions:  Precautions Precautions: Fall Recall of Precautions/Restrictions: Intact Precaution/Restrictions Comments: SBP <180, R hemiparesis Required Braces or Orthoses: Other Brace Other Brace: R LE PRAFO, R UE sling, R UE resting hand splint Restrictions Weight Bearing Restrictions Per Provider Order: No General: Pt seated in W/C upon OT arrival, agreeable to OT.  Pain: no pain reported  Other Treatments: OT providing therapeutic use of self in order to build rapport and discuss patient current situation and goals for therapy. OT educating pt on SNF and therapy in SNF. Pt feels as if this will be good to help get her stronger in order to go home. OT educating pt on DME that could be recommended for a safe D/C once D/C with SNF. OT also educating pt on positioning hemi side and purpose of resting hand splint for nighttime use as well as making sure Rt side is stretched in order to prevent increased tone.  Pt appreciative of information.    Pt seated in W/C at end of session with W/C alarm donned, call light within reach and 4Ps assessed.    Therapy/Group: Individual Therapy  Camie Hoe, OTD,  OTR/L 11/24/2023, 2:32 PM

## 2023-11-25 DIAGNOSIS — M542 Cervicalgia: Secondary | ICD-10-CM

## 2023-11-25 LAB — BASIC METABOLIC PANEL WITH GFR
Anion gap: 11 (ref 5–15)
BUN: 20 mg/dL (ref 8–23)
CO2: 20 mmol/L — ABNORMAL LOW (ref 22–32)
Calcium: 9.2 mg/dL (ref 8.9–10.3)
Chloride: 98 mmol/L (ref 98–111)
Creatinine, Ser: 0.93 mg/dL (ref 0.44–1.00)
GFR, Estimated: >60 mL/min (ref >60)
Glucose, Bld: 105 mg/dL — ABNORMAL HIGH (ref 70–99)
Potassium: 4.4 mmol/L (ref 3.5–5.1)
Sodium: 129 mmol/L — ABNORMAL LOW (ref 135–145)

## 2023-11-25 LAB — CBC
HCT: 23.2 % — ABNORMAL LOW (ref 36.0–46.0)
Hemoglobin: 7.9 g/dL — ABNORMAL LOW (ref 12.0–15.0)
MCH: 29.4 pg (ref 26.0–34.0)
MCHC: 34.1 g/dL (ref 30.0–36.0)
MCV: 86.2 fL (ref 80.0–100.0)
Platelets: 120 K/uL — ABNORMAL LOW (ref 150–400)
RBC: 2.69 MIL/uL — ABNORMAL LOW (ref 3.87–5.11)
RDW: 17.7 % — ABNORMAL HIGH (ref 11.5–15.5)
WBC: 6.1 K/uL (ref 4.0–10.5)
nRBC: 0 % (ref 0.0–0.2)

## 2023-11-25 LAB — GLUCOSE, CAPILLARY
Glucose-Capillary: 109 mg/dL — ABNORMAL HIGH (ref 70–99)
Glucose-Capillary: 116 mg/dL — ABNORMAL HIGH (ref 70–99)
Glucose-Capillary: 145 mg/dL — ABNORMAL HIGH (ref 70–99)
Glucose-Capillary: 157 mg/dL — ABNORMAL HIGH (ref 70–99)

## 2023-11-25 NOTE — Progress Notes (Signed)
 1                                                        PROGRESS NOTE    Subjective/Complaints: Pt reported L neck this AM.  Has not had any medications for pain this AM.    ROS: Limited due to cognitive/behavioral. Denies CP/SOB, Nausea, Vomiting + Painful swallowing - improved + L shoulder pain-continued + neck pain  Objective:   DG Shoulder Left Result Date: 11/24/2023 CLINICAL DATA:  Pain. EXAM: LEFT SHOULDER - 2+ VIEW COMPARISON:  None Available. FINDINGS: There is no evidence of fracture or dislocation. Mild acromioclavicular degenerative spurring. Subcortical cystic change in the lateral humeral head. No erosion or bony destructive change. Soft tissues are unremarkable. IMPRESSION: 1. Mild acromioclavicular degenerative change. 2. Subcortical cystic change in the lateral humeral head, can be seen with rotator cuff arthropathy. Electronically Signed   By: Andrea Gasman M.D.   On: 11/24/2023 15:02    Recent Labs    11/24/23 0454 11/25/23 0452  WBC 6.0 6.1  HGB 8.1* 7.9*  HCT 24.3* 23.2*  PLT 132* 120*    Recent Labs    11/24/23 0454 11/25/23 0452  NA 130* 129*  K 4.3 4.4  CL 101 98  CO2 20* 20*  GLUCOSE 130* 105*  BUN 17 20  CREATININE 1.05* 0.93  CALCIUM  9.3 9.2     Intake/Output Summary (Last 24 hours) at 11/25/2023 1235 Last data filed at 11/25/2023 0802 Gross per 24 hour  Intake 480 ml  Output --  Net 480 ml        Physical Exam: Vital Signs Blood pressure (!) 115/36, pulse 76, temperature 98.4 F (36.9 C), temperature source Oral, resp. rate 18, height 5' 6.5 (1.689 m), weight (S) 53.8 kg, SpO2 100%.   Constitutional: No distress . Vital signs reviewed.  Sitting up in bed. Appears chronically ill  HEENT: NCAT, EOMI, oral membranes moist. Neck: supple Cardiovascular: RRR without murmur. No JVD    Respiratory/Chest: CTA Bilaterally without wheezes or rales. Normal effort    GI/Abdomen: BS +, non-tender, non-distended Ext: no clubbing,  cyanosis, or edema Psych: flat but cooperative  Skin: Clean and intact without signs of breakdown  Neuro: Awake and alert, delayed responses.  Minimal verbal output.  R inattention  Decreased sensation light touch right lower extremity.  Decreased to light touch on RLE< but not RUE or face  RUE- tr/5 ff x1, unable to repeat; 0/5 otherwise  LUE 4+/5 throughout RLE- HF 2/5; KE/KF 2/5; DF 0/5; PF 2-/5 LLE- 4+/5 throughout  Tone: MAS 2 right shoulder, MAS 3 right elbow, MAS 0 wrists, fingers, MAS 2 knee flexors  Prior neuro assessment is c/w today's exam 11/25/2023.   Musculoskeletal: Mild tenderness Mid left C spine paraspinal muscles, head forward posture, minimal pain L shoulder rom, minimal L trapezius tenderness     Assessment/Plan: 1. Functional deficits which require 3+ hours per day of interdisciplinary therapy in a comprehensive inpatient rehab setting. Physiatrist is providing close team supervision and 24 hour management of active medical problems listed below. Physiatrist and rehab team continue to assess barriers to discharge/monitor patient progress toward functional and medical goals  Care Tool:  Bathing    Body parts bathed by patient: Right arm, Left arm, Chest, Abdomen, Front perineal area   Body  parts bathed by helper: Abdomen, Chest, Front perineal area, Buttocks, Right upper leg, Left upper leg, Right lower leg, Left lower leg, Face     Bathing assist Assist Level: Total Assistance - Patient < 25%     Upper Body Dressing/Undressing Upper body dressing   What is the patient wearing?: Hospital gown only, Pull over shirt    Upper body assist Assist Level: Total Assistance - Patient < 25%    Lower Body Dressing/Undressing Lower body dressing      What is the patient wearing?: Underwear/pull up     Lower body assist Assist for lower body dressing: Total Assistance - Patient < 25%     Toileting Toileting    Toileting assist Assist for toileting:  Dependent - Patient 0%     Transfers Chair/bed transfer  Transfers assist  Chair/bed transfer activity did not occur: Safety/medical concerns (fatigue)  Chair/bed transfer assist level: Maximal Assistance - Patient 25 - 49%     Locomotion Ambulation   Ambulation assist   Ambulation activity did not occur: Safety/medical concerns          Walk 10 feet activity   Assist  Walk 10 feet activity did not occur: Safety/medical concerns        Walk 50 feet activity   Assist Walk 50 feet with 2 turns activity did not occur: Safety/medical concerns         Walk 150 feet activity   Assist Walk 150 feet activity did not occur: Safety/medical concerns         Walk 10 feet on uneven surface  activity   Assist Walk 10 feet on uneven surfaces activity did not occur: Safety/medical concerns         Wheelchair     Assist Is the patient using a wheelchair?: Yes Type of Wheelchair: Manual (TIS)    Wheelchair assist level: Dependent - Patient 0%      Wheelchair 50 feet with 2 turns activity    Assist        Assist Level: Dependent - Patient 0%   Wheelchair 150 feet activity     Assist      Assist Level: Dependent - Patient 0%   Blood pressure (!) 115/36, pulse 76, temperature 98.4 F (36.9 C), temperature source Oral, resp. rate 18, height 5' 6.5 (1.689 m), weight (S) 53.8 kg, SpO2 100%.  Medical Problem List and Plan: 1. Functional deficits secondary to  bilateral embolic shower, embolic pattern, etiology:  Afib and missed Eliquis  doses vs from NSTEMI with cardiac cath.              -patient may  shower             -ELOS/Goals: 12-15 days min A-              Cannot sit upright without propping            -Continue CIR therapies including PT, OT, and SLP   - Ordered PRAFO/WHO right upper extremity, sling for right upper extremity also ordered  -Will plan for palliative consult for goals of care - notified daughter and pt, pt seen  yesterday, appreciate assistance  - Recheck CBC and BMP today due to reports of fatigue but overall appears around her baseline, Check CXR- later in afternoon pt reported to nursing/therapy she had cough in AM that resolved, per therapy patient reporting she feels she may have pneumonia - says she's had it before and feels like it's the same ,  Will add U/A  -possibly mood related?, she had recent goals of care discussion   10-25: Chest x-ray unchanged, increasingly hyponatremic.  Increase IV fluids to 75 cc/h, urinalysis pending.  10-26: Lethargy improving, more engaged today. 11-22-26 lethargy remains improved(back to basline), consider SNF recommendation  Team conference today please see physician documentation under team conference tab, met with team  to discuss problems,progress, and goals. Formulized individual treatment plan based on medical history, underlying problem and comorbidities.  -Family Looking into SNF options- think this is a good idea    2.  Antithrombotics: -DVT/anticoagulation:  Pharmaceutical: Eliquis  Transitioned from IV Heparin  to Eliquis  10/14             -antiplatelet therapy: Asprin 81 mg  3. Pain Management: Gabapentin  and Tylenol  as needed  -Voltaren  R shoulder for R shoulder pain  - 10/17 patient now reporting some pain in her left shoulder, suspect from increased use with therapy.  Right shoulder doing better.  Will try Voltaren  gel to left shoulder, consider imaging if does not improve  10/18 added kpad for neck/shoulder as well  -10/22-23 Continue voltaren  gel for occasional shoulder pain  - Can try K-pad for neck as she has some soreness this morning  11-19-25 no complaints of pain  10/28, intermittent shoulder pain, had pain yesterday and had rapid response called yesterday to r/o cardiac event and had EKG/Trop, was found to have pain in trapezius muscle. Xray L shoulder, try lidocaine  patch  10/30 neck pain reported this AM, spoke with nursing early  afternoon, pain improved with heatpack/repositioning and voltaren  gel  4. Mood/Behavior/Sleep: LCSW to follow for evaluation and support when available.              -antipsychotic agents: N/A 5. Neuropsych/cognition: This patient is not quite capable of making decisions on her own behalf. 6. Skin/Wound Care: routine pressure relief measures.  7. Fluids/Electrolytes/Nutrition: monitor intake and output. Follow up labs in a.m.              - SLP consult: Carb modified diet + Ensure  -nutrition consult for decreased p.o. intake  10/18 intake remains minimal. RD following   -added megace for appetitte  10/19 obsv today. Recheck labs, prealbumin in AM   10/20 Prealbumin 21 this AM, continue to monitor PO intake  10/22 Hx of pain with swallowing, appears has been chronic for several, nursing reports daughter said yesterday she was seen by GI in the past. 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 Continue aspirin  and statin.   9. CAD: 10/4-10/7 admitted for NSTEMI  10. HLD: Lipitor 40 mg  11. HTN: monitor blood pressures on  Hyzaar, Cardizem , and Aldactone      11/25/2023    5:28 AM 11/24/2023    9:21 PM 11/24/2023    5:19 AM  Vitals with BMI  Systolic 115 144 871  Diastolic 36 53 50  Pulse 76 85 76  - 10/27-30 BP stable, continue current  12. Paroxymal Atrial Flutter/Fib: continue Eliquis   2.5 bid  13. T2DM: HgbA1c 6.0, resumed home Amaryl   - monitor cbg AC/HS with SSI.  CBG (last 3)  Recent  Labs    11/24/23 2204 11/25/23 0633 11/25/23 1118  GLUCAP 149* 109* 116*  - 10/15 Will order CBG/SSI  - CBGs well-controlled, holding amaryl . 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  Hypothyroidism: Synthroid  17.  Chronic anemia: Stable, monitor CBC.   -10/17 Hgb improved to 10 yesterday  -10/18 lower HGB at 8.4 suspect dilutional due to IVF, recheck tomorrow  - 10-26: Hemoglobin from 10-8.2.  May be dilutional due to IV fluids, will get iron studies and FOBT to evaluate --FOBT positive, hesitant to hold [facility] given recent embolic strokes, will increase GI prophylaxis to 40 mg twice daily and defer AC discussion to primary team.  Trend hemoglobin. 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  Continue to monitor  -10/20 NA 131 today, continue to monitor   -10/23 NA stable 131  10-25: Sodium 128 yesterday, repeat today similar.  Serum osmole's within normal limits, increasing IV fluids from 50 to 75 cc/h overnight and repeat labs in AM.  Placed 1200 cc p.o. fluid restriction.  Urine studies pending.  10-26: Sodium remains 128 despite IV fluids; urine/serum osmole's indicative of SIADH. ?  Due to lung pathology, DC trazodone , DC IV fluids, start salt tabs 1 g 2 times daily and repeat labs in AM.  10/29 NA stable 130, continue current   10/30 NA 129, overall stable. Appears hyponatremia has been chronic for several  years.   21. Azotemia  -10/20 IVF NS 50ml/hr  -10/23 BUN and CR improved, continue IVF at night for poor oral intake, can hold if drinks 1L  10-25: Creatinine increased to 1.02; increasing IV fluids as above  10-26: Creatinine 0.94, DC IV fluids due to SIADH as above.  -10/27 Cr/BUN  trending up as IVF stopped, recheck tomorrow  -10/30 Bun 20, 0.93 Cr stable overall   22.  Urinary incontinence/UTI  - 10-25 urinalysis mildly positive; start Keflex  500 mg twice daily for 7 days; follow sensitivities- Ecoli sensitive to cefazolin - continue current regimen  23.  Right hemiparesis/spasticity  - 10-26: Due to cognitive issues and lethargy, hesitant to schedule muscle relaxers.  Start baclofen 5 mg 3 times daily as needed.  -Continue PRN as above to avoid sedation  LOS: 16 days A FACE TO FACE EVALUATION WAS PERFORMED  Lavine Collier 11/25/2023, 12:35 PM

## 2023-11-25 NOTE — Progress Notes (Signed)
 Physical Therapy Note  Patient Details  Name: [name] MRN: [id number] Date of Birth: Mar 14, 1940 Today's Date: 11/25/2023    Pt's plan of care adjusted to 15/7 after speaking with care team and discussed with MD as pt currently unable to tolerate current therapy schedule with OT, PT, and SLP.     [name], DPT 11/25/2023, 5:10 PM

## 2023-11-25 NOTE — Progress Notes (Signed)
 Physical Therapy Session Note  Patient Details  Name: [name] MRN: [id number] Date of Birth: March 21, 1940  Today's Date: 11/25/2023 PT Individual Time: 1135-1200 PT Individual Time Calculation (min): 25 min   Short Term Goals: Week 3:  PT Short Term Goal 1 (Week 3): STG=LTG due to ELOS  Skilled Therapeutic Interventions/Progress Updates: Pt presented in bed agreeable to therapy. Pt with kpad at neck shoulders denies pain at start of session. PTA performed ROM at ankles/hip knee. Pt then performed AA heel slides progressing to active hip flexion/extension with RLE on beach ball x 10. Pt also performed AA progressing to active SAQ 2 x 5 with cues for increased activation nearing full knee extension. R hip fall outs with active adduction back to neutral requiring max multimodal cues. Once completed pt left in bed at end of session with call bell within reach and needs met.      Therapy Documentation Precautions:  Precautions Precautions: Fall Recall of Precautions/Restrictions: Intact Precaution/Restrictions Comments: SBP <180, R hemiparesis Required Braces or Orthoses: Other Brace Other Brace: R LE PRAFO, R UE sling, R UE resting hand splint Restrictions Weight Bearing Restrictions Per Provider Order: No   Therapy/Group: Individual Therapy  Kalynne Womac 11/25/2023, 2:25 PM

## 2023-11-25 NOTE — Progress Notes (Addendum)
 Patient ID: [name], female   DOB: Sep 26, 1940, 83 y.o.   MRN: [id number]  Send out the [facility] and will see what offers are accepted then can let daughter know and she can tour and or call them to decide which one she will accept for Mom  3:17 PM Spoke with [name]-daughter who has contacted [employer] and [employer] and would like to consider both of these. Will reach out to them due to sent FL2 and is still pending approval

## 2023-11-25 NOTE — Plan of Care (Signed)
  Problem: Consults Goal: RH STROKE PATIENT EDUCATION Description: See Patient Education module for education specifics  Outcome: Progressing   Problem: RH SAFETY Goal: RH STG ADHERE TO SAFETY PRECAUTIONS W/ASSISTANCE/DEVICE Description: STG Adhere to Safety Precautions With cues Assistance/Device. Outcome: Progressing   Problem: RH KNOWLEDGE DEFICIT Goal: RH STG INCREASE KNOWLEDGE OF DIABETES Description: Patient and dtr will be able to manage DM using educational resources for medications and dietary modification independently Outcome: Progressing Goal: RH STG INCREASE KNOWLEDGE OF HYPERTENSION Description: Patient and dtr will be able to manage HTN using educational resources for medications and dietary modification independently Outcome: Progressing Goal: RH STG INCREASE KNOWLEGDE OF HYPERLIPIDEMIA Description: Patient and dtr will be able to manage HLD using educational resources for medications and dietary modification independently Outcome: Progressing Goal: RH STG 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 Description: Patient and dtr will be able to manage secondary risks using educational resources for medications and dietary modification independently Outcome: Progressing   Problem: Education: Goal: Ability to describe self-care measures that may prevent or decrease complications (Diabetes Survival Skills Education) will improve Outcome: Progressing Goal: Individualized Educational Video(s) Outcome: Progressing   Problem: Coping: Goal: Ability to adjust to condition or change in health will improve Outcome: Progressing   Problem: Fluid Volume: Goal: Ability to maintain a balanced intake and output will improve Outcome: Progressing   Problem: Health Behavior/Discharge Planning: Goal: Ability to identify and utilize available resources and services will improve Outcome: Progressing Goal: Ability to manage health-related needs will improve Outcome: Progressing    Problem: Metabolic: Goal: Ability to maintain appropriate glucose levels will improve Outcome: Progressing   Problem: Nutritional: Goal: Maintenance of adequate nutrition will improve Outcome: Progressing Goal: Progress toward achieving an optimal weight will improve Outcome: Progressing   Problem: Skin Integrity: Goal: Risk for impaired skin integrity will decrease Outcome: Progressing   Problem: Tissue Perfusion: Goal: Adequacy of tissue perfusion will improve Outcome: Progressing

## 2023-11-25 NOTE — Progress Notes (Signed)
 Physical Therapy Session Note  Patient Details  Name: [name] MRN: [id number] Date of Birth: Sep 14, 1940  Today's Date: 11/25/2023 PT Individual Time: 1417-1500 PT Individual Time Calculation (min): 43 min   Short Term Goals: Week 3:  PT Short Term Goal 1 (Week 3): STG=LTG due to ELOS  Skilled Therapeutic Interventions/Progress Updates:     Pt seated in TIS upon arrival. Pt denied pain and agreeable to therapy. Session scheduled for family training, however, family did not show.  Session emphasized functional strengthening and trunk control with transfers, sitting balance, and standing tolerance. Pt transported dependent in TIS to main gym. Pt transferred TIS to EOM with max A. Initially pt demonstrated good seated balance with CGA and verbal cueing to maintain midline position though head remain down. For this reason, we worked on standing tolerance in [place]. At this point, pt participation declined. Pt required total A (+2) for sit to stand transfers x2 trials with reduced movement initiation/participation during second stand. Once seated EOM again, pt unable to achieve or maintain midline, upright seated position without mod-total A. In attempt to improve engagement within session, during seated balance activity, pt instructed to pull squig off mirror using L UE, however, patient refused to participate in activity stating she was too tired. Pt transferred back to TIS total A, transported dependent in TIS back to room, and transferred back to bed total A. Pt positioned for comfort, bed alarm set, and all needs within reach at end of session.  Max verbal and tactile/manual cueing and external encouragement required throughout session for movement initiation as pt participation very limited.  Therapy Documentation Precautions:  Precautions Precautions: Fall Recall of Precautions/Restrictions: Intact Precaution/Restrictions Comments: SBP <180, R hemiparesis Required Braces or Orthoses:  Other Brace Other Brace: R LE PRAFO, R UE sling, R UE resting hand splint Restrictions Weight Bearing Restrictions Per Provider Order: No  Therapy/Group: Individual Therapy  [name], DPT 11/25/2023, 4:29 PM

## 2023-11-25 NOTE — Progress Notes (Signed)
 Speech Language Pathology Daily Session Note  Patient Details  Name: [name] MRN: [id number] Date of Birth: [birth year]  Today's Date: 11/25/2023 SLP Individual Time: 1500-1530 SLP Individual Time Calculation (min): 30 min  Short Term Goals: Week 3: SLP Short Term Goal 1 (Week 3): STG = LTG due to ELOS  Skilled Therapeutic Interventions: Skilled therapy session focused on education goals. Upon entrance, family not present, therefore SLP educated patient on recent recommendations from MBS and speech intelligibility. During education, patients granddaughter entered room. SLP introduced self and role. SLP educated family on cognitive goals and diet recommendations. SLP shared images of patients MBS and reviewed basic oropharyngeal anatomy and physiology. SLP provided examples of D3/thin diet and medications crushed in puree. SLP provided ideas for family to assist in R attention outside of therapy including sitting on the patients R during conversation. Family verbalized understanding of education with no further questions. Patient left in bed with alarm set and call bell in reach. Continue POC.    Pain Neck pain - repositioned   Therapy/Group: Individual Therapy  Corderius Saraceni M.A., CCC-SLP 11/25/2023, 7:48 AM

## 2023-11-25 NOTE — Progress Notes (Signed)
 Occupational Therapy Session Note  Patient Details  Name: KEYANNI WHITTINGHILL MRN: [id number] Date of Birth: 05-24-1940  Today's Date: 11/25/2023 OT Individual Time: [phone] OT Individual Time Calculation (min): 56 min    Short Term Goals: Week 3:  OT Short Term Goal 1 (Week 3): patient will demonstrate toilet transfers with maxA OT Short Term Goal 2 (Week 3): patient will complete LB donning with max A of 1 OT Short Term Goal 3 (Week 3): patient be able to complete static standing in prep for ADLS with max A OT Short Term Goal 4 (Week 3): patient will complete toileting on BSC or toilet with max A  Skilled Therapeutic Interventions/Progress Updates:    Patient received supine in bed, reporting soreness on buttocks from rough bathing.  Patient assisted to roll toward left and transition to sitting.  Patient able to maintain static sitting balance with intermittent min assist while preparing for transfer to tilt in space wheelchair.  Patient able to scoot toward edge of bed with mod assist.  Stand pivot transfer with max assist.  Patient with flexed trunk.  Transported to gym to address sit to stand transition and modified plantigrade position to allow RUE/RLE to be part of base of support.  Patient with min shoulder flex/ext following weight bearing. Subsequent attempts to stand less effective, patient becoming fatigued.  Patient taken to room to clean up after incontinent episode.  Patient able to assist by bridging, and rolling left/right.  Patient reports pain in left limbs - unable to determine where specifically.  Patient consistently reaching for left neck.  Applied Kpad and positioned pillow to support head in more neutral position.  Bed alarm engaged and call bell/ personal items in reach.    Therapy Documentation Precautions:  Precautions Precautions: Fall Recall of Precautions/Restrictions: Intact Precaution/Restrictions Comments: SBP <180, R hemiparesis Required Braces or Orthoses:  Other Brace Other Brace: R LE PRAFO, R UE sling, R UE resting hand splint Restrictions Weight Bearing Restrictions Per Provider Order: No Pain:  Unscored pain in neck, LEFT arm/ leg.  Repositioned and applied heat.     Therapy/Group: Individual Therapy  Josefita Weissmann M 11/25/2023, 10:43 AM

## 2023-11-25 NOTE — Progress Notes (Signed)
 Occupational Therapy Session Note  Patient Details  Name: [name] MRN: [id number] Date of Birth: 1940/12/03  Today's Date: 11/25/2023 OT Individual Time: 8698-8655 OT Individual Time Calculation (min): 43 min    Short Term Goals: Week 3:  OT Short Term Goal 1 (Week 3): patient will demonstrate toilet transfers with maxA OT Short Term Goal 2 (Week 3): patient will complete LB donning with max A of 1 OT Short Term Goal 3 (Week 3): patient be able to complete static standing in prep for ADLS with max A OT Short Term Goal 4 (Week 3): patient will complete toileting on BSC or toilet with max A  Skilled Therapeutic Interventions/Progress Updates:   Patient received sitting up in bed, direct hand off from NT who was providing supervision and assistance for lunch.  Patient able to feed herself with her left hand, although needing frequent redirection.  Patient at times benefited from being fed due to UE fatigue.  Once patient indicated she was finished eating, assisted to roll left/right to ensure she was in a clean and dry brief.   Patient assisted to roll toward left and assisted to sit at edge of bed.  Patient with poor sitting balance this session as compared to earlier session.  Needing mod assist initially and over time progressing to intermittent min assist.  Patient completed stand pivot transfer with max assist to tilt in space chair.  Patient reclined to support head.  Patient reporting 10/10 pain in left humerus.  Describes pain as aching - constant.  Reports she has started on a muscle relaxant.  Patient having difficulty maintaining eyes open throughout this session.  Kpad applied to neck and left arm.  Patient reports pain resolving.   Belt alarm applied and call bell/ personal items in reach.    Therapy Documentation Precautions:  Precautions Precautions: Fall Recall of Precautions/Restrictions: Intact Precaution/Restrictions Comments: SBP <180, R hemiparesis Required Braces  or Orthoses: Other Brace Other Brace: R LE PRAFO, R UE sling, R UE resting hand splint Restrictions Weight Bearing Restrictions Per Provider Order: No   Pain:  10/10 pain left humerus - mid shaft- lateral aspect Pain reduced with repositioning/ support and Kpad.    Therapy/Group: Individual Therapy  Icyss Skog M 11/25/2023, 1:46 PM

## 2023-11-26 LAB — GLUCOSE, CAPILLARY
Glucose-Capillary: 116 mg/dL — ABNORMAL HIGH (ref 70–99)
Glucose-Capillary: 131 mg/dL — ABNORMAL HIGH (ref 70–99)
Glucose-Capillary: 104 mg/dL — ABNORMAL HIGH (ref 70–99)
Glucose-Capillary: 107 mg/dL — ABNORMAL HIGH (ref 70–99)

## 2023-11-26 NOTE — Progress Notes (Signed)
 Speech Language Pathology Daily Session Note  Patient Details  Name: [name] MRN: [id number] Date of Birth: 08/06/1940  Today's Date: 11/26/2023 SLP Individual Time: 1030-1127 SLP Individual Time Calculation (min): 57 min  Short Term Goals: Week 3: SLP Short Term Goal 1 (Week 3): STG = LTG due to ELOS  Skilled Therapeutic Interventions: SLP conducted skilled therapy session targeting cognition and communication goals. SLP reviewed SLOP speech intelligibility strategies prior to task initiation. Patient then utilized strategies at the word and phrase levels given supervision assist to achieve 95% intelligibility. During mildly complex reasoning and problem solving task, patient benefited from supervision to generate accurate responses. Of note, patient in significant pain this date and lethargic. She required max cues to sustain attention to all tasks. RN notified of pain and reports plan to administer pain medications.  Patient was left in room with call bell in reach and alarm set. SLP will continue to target goals per plan of care.        Pain Pain Assessment Pain Scale: 0-10 Pain Score: 7  Pain Location: Neck Pain Intervention(s): Medication (See eMAR)  Therapy/Group: Individual Therapy  [name], M.Kathryn., CCC-SLP  [name] [name] 11/26/2023, 11:32 AM

## 2023-11-26 NOTE — Progress Notes (Signed)
 Physical Therapy Discharge Summary  Patient Details  Name: [name] MRN: [id number] Date of Birth: 1940/10/03  Date of Discharge from PT service:December 09, 2023   Patient has met 2 of 3 long term goals.  Patient to discharge at a wheelchair level Total Assist. Pt to DC to SNF due to high care needs.  Reasons goals not met: Limited progress toward goals due to pain, weakness, significant fatigue and limited endurance/activity tolerance, and self-limiting behaviors.  Recommendation:  Patient will benefit from ongoing skilled PT services in skilled nursing facility setting to continue to advance safe functional mobility, address ongoing impairments in strength, activity tolerance, endurance, mobility, transfers, balance, NMR, and minimize fall risk.  Equipment: No equipment provided  Reasons for discharge: lack of progress toward goals  Patient/family agrees with progress made and goals achieved: Yes  PT Discharge Precautions/Restrictions Precautions Precautions: Fall Recall of Precautions/Restrictions: Intact Precaution/Restrictions Comments: SBP <180, R hemiparesis Other Brace: R LE PRAFO, R UE sling, R UE resting hand splint Restrictions Weight Bearing Restrictions Per Provider Order: No Pain Interference Pain Interference Pain Effect on Sleep: 4. Almost constantly Pain Interference with Therapy Activities: 4. Almost constantly Pain Interference with Day-to-Day Activities: 4. Almost constantly Vision/Perception  Vision - History Ability to See in Adequate Light: 0 Adequate Perception Perception: Impaired Preception Impairment Details: Inattention/Neglect Praxis Praxis: Impaired Praxis Impairment Details: Initiation;Motor planning  Cognition Overall Cognitive Status: Impaired/Different from baseline Arousal/Alertness: Lethargic Memory: Impaired Awareness: Appears intact Problem Solving: Impaired Safety/Judgment: Appears intact Sensation Sensation Light  Touch: Impaired by gross assessment Proprioception: Impaired by gross assessment Additional Comments: R hemiparesis Coordination Gross Motor Movements are Fluid and Coordinated: No Coordination and Movement Description: limited due to R hemiparesis Motor  Motor Motor: Hemiplegia Motor - Discharge Observations: R hemiparesis/neglect/inattention  Mobility Bed Mobility Bed Mobility: Rolling Right;Rolling Left;Supine to Sit;Sit to Supine Rolling Right: Maximal Assistance - Patient 25-49% Rolling Left: Total Assistance - Patient < 25%;Maximal Assistance - Patient 25-49% Supine to Sit: Total Assistance - Patient < 25%;Maximal Assistance - Patient - Patient 25-49% Sit to Supine: Total Assistance - Patient < 25%;Dependent - Patient equal 0% Transfers Transfers: Sit to Stand;Stand to Sit;Squat Pivot Transfers Sit to Stand: 2 Helpers;Dependent - Patient 0% Stand to Sit: 2 Helpers;Dependent - Patient equal 0% Squat Pivot Transfers: Dependent - Patient 0%;2 Helpers Transfer (Assistive device): None Locomotion  Gait Ambulation: No Gait Gait: No Stairs / Additional Locomotion Stairs: No Wheelchair Mobility Wheelchair Mobility: Yes Wheelchair Assistance: Dependent - Patient 0% Wheelchair Parts Management: Other (comment) (Dependent)  Trunk/Postural Assessment  Cervical Assessment Cervical Assessment: Exceptions to [facility] (significant forward head) Thoracic Assessment Thoracic Assessment: Exceptions to [name] (rounded shoulders) Lumbar Assessment Lumbar Assessment: Exceptions to [place] Postural Control Postural Control: Deficits on evaluation Head Control: difficulty holding head up especially with fatigue Trunk Control: heavy lateral trunk lean to R Righting Reactions: delayed, requires L UE support Protective Responses: delayed  Balance Balance Balance Assessed: Yes Static Sitting Balance Static Sitting - Balance Support: Left upper extremity supported Static Sitting - Level of  Assistance: 2: Max assist;1: +1 Total assist Dynamic Sitting Balance Dynamic Sitting - Balance Support: Left upper extremity supported Dynamic Sitting - Level of Assistance: 2: Max assist;1: +1 Total assist Extremity Assessment  RLE Assessment RLE Assessment: Exceptions to [facility] RLE Strength Right Hip Flexion: 2-/5 Right Hip ABduction: 2-/5 Right Hip ADduction: 2-/5 Right Knee Flexion: 2/5 Right Knee Extension: 2-/5 Right Ankle Dorsiflexion: 1/5 Right Ankle Plantar Flexion: 2-/5 LLE Assessment General Strength Comments: grossly 4/5  [name], DPT 11/26/2023, 5:26 PM

## 2023-11-26 NOTE — Progress Notes (Addendum)
 Patient ID: [name], female   DOB: Oct 23, 1940, 83 y.o.   MRN: [id number]  Have reached out to [facility] and [facility] to see if can look at the information and let this worker know if either of them can take. Await response.  2:23 PM Heard back form [facility] they are full and do not forsee's a bed opening up. Will let daughter know this. Did and let know still waiting to hear back from [facility]. Have expanded the search.

## 2023-11-26 NOTE — Progress Notes (Signed)
 Physical Therapy Session Note  Patient Details  Name: [name] MRN: [id number] Date of Birth: [birth year]  Today's Date: 11/26/2023 PT Individual Time: 0806-0915 PT Individual Time Calculation (min): 69 min   Short Term Goals: Week 3:  PT Short Term Goal 1 (Week 3): STG=LTG due to ELOS  Skilled Therapeutic Interventions/Progress Updates:     Pt in bed finishing up breakfast with tech upon arrival. Pt reported shoulder pain but agreeable to therapy. Pt has lidocaine  patch on L shoulder and pain level improved with repositioning. Session emphasized activity tolerance, functional strengthening/endurance, coordination, and trunk control with transfers and sitting balance. Pt lifted L LE with verbal cueing and R LE with mod A to don pants. Pt instructed in and pushed through B LE (mod-max A to block R LE) to perform bridge in order to pull pants up. Verbal cues required for patient participation using L UE. Pt performed supine to sit transfer to EOB with max A and verbal cueing for LEs (R>L) and to push through L UE to bring trunk to upright position. Pt performed squat pivot transfer EOB to TIS with max A and verbal cueing for L UE placement. Pt participated in game of connect 4 to work on sitting balance and L UE reaching. Pt able to maintain good static sitting balance at times with L UE support, however, requires min-max A, depending on fatigue level. Pt required mod-total A (+2 at times) with dynamic sitting balance in order to reach for game pieces and place into slot. Pt required significant verbal cueing and encouragement for participation as fatigue increased. Pt upright posture improved with tactile cueing at chest and shoulders. Worked on upright sitting with manual facilitation of R UE into weight bearing position to assist with balance. Pt transported dependent in TIS back to room and transferred back to bed via squat pivot with max A. Pt returned to supine with max A. All needs within  reach and bed alarm on at end of session.  Therapy Documentation Precautions:  Precautions Precautions: Fall Recall of Precautions/Restrictions: Intact Precaution/Restrictions Comments: SBP <180, R hemiparesis Required Braces or Orthoses: Other Brace Other Brace: R LE PRAFO, R UE sling, R UE resting hand splint Restrictions Weight Bearing Restrictions Per Provider Order: No  Therapy/Group: Individual Therapy  [name], DPT 11/26/2023, 8:02 AM

## 2023-11-26 NOTE — Plan of Care (Signed)
  Problem: Consults Goal: RH STROKE PATIENT EDUCATION Description: See Patient Education module for education specifics  Outcome: Progressing   Problem: RH SAFETY Goal: RH STG ADHERE TO SAFETY PRECAUTIONS W/ASSISTANCE/DEVICE Description: STG Adhere to Safety Precautions With cues Assistance/Device. Outcome: Progressing   Problem: RH KNOWLEDGE DEFICIT Goal: RH STG INCREASE KNOWLEDGE OF DIABETES Description: Patient and dtr will be able to manage DM using educational resources for medications and dietary modification independently Outcome: Progressing Goal: RH STG INCREASE KNOWLEDGE OF HYPERTENSION Description: Patient and dtr will be able to manage HTN using educational resources for medications and dietary modification independently Outcome: Progressing Goal: RH STG INCREASE KNOWLEGDE OF HYPERLIPIDEMIA Description: Patient and dtr will be able to manage HLD using educational resources for medications and dietary modification independently Outcome: Progressing Goal: RH STG 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 Description: Patient and dtr will be able to manage secondary risks using educational resources for medications and dietary modification independently Outcome: Progressing   Problem: Education: Goal: Ability to describe self-care measures that may prevent or decrease complications (Diabetes Survival Skills Education) will improve Outcome: Progressing Goal: Individualized Educational Video(s) Outcome: Progressing   Problem: Coping: Goal: Ability to adjust to condition or change in health will improve Outcome: Progressing   Problem: Fluid Volume: Goal: Ability to maintain a balanced intake and output will improve Outcome: Progressing   Problem: Health Behavior/Discharge Planning: Goal: Ability to identify and utilize available resources and services will improve Outcome: Progressing Goal: Ability to manage health-related needs will improve Outcome: Progressing    Problem: Metabolic: Goal: Ability to maintain appropriate glucose levels will improve Outcome: Progressing   Problem: Nutritional: Goal: Maintenance of adequate nutrition will improve Outcome: Progressing Goal: Progress toward achieving an optimal weight will improve Outcome: Progressing   Problem: Skin Integrity: Goal: Risk for impaired skin integrity will decrease Outcome: Progressing   Problem: Tissue Perfusion: Goal: Adequacy of tissue perfusion will improve Outcome: Progressing

## 2023-11-26 NOTE — Progress Notes (Signed)
 1                                                        PROGRESS NOTE    Subjective/Complaints: Neck pain controlled with tylenol , heating pad.  Po intake moderate to poor.    ROS: Limited due to cognitive/behavioral. Denies CP/SOB, Nausea, Vomiting, abdominal pain + Painful swallowing - improved + L shoulder pain-continued + neck pain-continued   Objective:   No results found.   Recent Labs    11/24/23 0454 11/25/23 0452  WBC 6.0 6.1  HGB 8.1* 7.9*  HCT 24.3* 23.2*  PLT 132* 120*    Recent Labs    11/24/23 0454 11/25/23 0452  NA 130* 129*  K 4.3 4.4  CL 101 98  CO2 20* 20*  GLUCOSE 130* 105*  BUN 17 20  CREATININE 1.05* 0.93  CALCIUM  9.3 9.2     Intake/Output Summary (Last 24 hours) at 11/26/2023 1323 Last data filed at 11/26/2023 1311 Gross per 24 hour  Intake 720 ml  Output --  Net 720 ml        Physical Exam: Vital Signs Blood pressure 134/60, pulse 84, temperature 99.8 F (37.7 C), resp. rate 18, height 5' 6.5 (1.689 m), weight (S) 53.8 kg, SpO2 100%.   Constitutional: No distress . Vital signs reviewed.  Sitting up in bed. Appears chronically ill  HEENT: NCAT, EOMI, oral membranes moist. Neck: supple, heading pad behind her neck Cardiovascular: RRR without murmur. No JVD    Respiratory/Chest: CTA Bilaterally without wheezes or rales. Normal effort    GI/Abdomen: BS +, non-tender, non-distended Ext: no clubbing, cyanosis, or edema Psych: flat but cooperative  Skin: Clean and intact without signs of breakdown  Neuro: Awake and alert, delayed responses.  Minimal verbal output.  R inattention  Decreased sensation light touch right lower extremity.  Decreased to light touch on RLE< but not RUE or face  RUE- tr/5 ff x1, unable to repeat; 0/5 otherwise  LUE 4+/5 throughout RLE- HF 2/5; KE/KF 2/5; DF 0/5; PF 2-/5 LLE- 4+/5 throughout  Tone: MAS 2 right shoulder, MAS 3 right elbow, MAS 0 wrists, fingers, MAS 2 knee flexors  Prior neuro  assessment is c/w today's exam 11/26/2023.   Musculoskeletal: Mild tenderness Mid left C spine paraspinal muscles, head forward posture, minimal pain L shoulder rom, minimal L trapezius tenderness     Assessment/Plan: 1. Functional deficits which require 3+ hours per day of interdisciplinary therapy in a comprehensive inpatient rehab setting. Physiatrist is providing close team supervision and 24 hour management of active medical problems listed below. Physiatrist and rehab team continue to assess barriers to discharge/monitor patient progress toward functional and medical goals  Care Tool:  Bathing    Body parts bathed by patient: Right arm, Left arm, Chest, Abdomen, Front perineal area   Body parts bathed by helper: Abdomen, Chest, Front perineal area, Buttocks, Right upper leg, Left upper leg, Right lower leg, Left lower leg, Face     Bathing assist Assist Level: Total Assistance - Patient < 25%     Upper Body Dressing/Undressing Upper body dressing   What is the patient wearing?: Hospital gown only, Pull over shirt    Upper body assist Assist Level: Total Assistance - Patient < 25%    Lower Body Dressing/Undressing Lower body dressing  What is the patient wearing?: Underwear/pull up     Lower body assist Assist for lower body dressing: Total Assistance - Patient < 25%     Toileting Toileting    Toileting assist Assist for toileting: Dependent - Patient 0%     Transfers Chair/bed transfer  Transfers assist  Chair/bed transfer activity did not occur: Safety/medical concerns (fatigue)  Chair/bed transfer assist level: Total Assistance - Patient < 25%     Locomotion Ambulation   Ambulation assist   Ambulation activity did not occur: Safety/medical concerns          Walk 10 feet activity   Assist  Walk 10 feet activity did not occur: Safety/medical concerns        Walk 50 feet activity   Assist Walk 50 feet with 2 turns activity did  not occur: Safety/medical concerns         Walk 150 feet activity   Assist Walk 150 feet activity did not occur: Safety/medical concerns         Walk 10 feet on uneven surface  activity   Assist Walk 10 feet on uneven surfaces activity did not occur: Safety/medical concerns         Wheelchair     Assist Is the patient using a wheelchair?: Yes Type of Wheelchair: Manual (TIS)    Wheelchair assist level: Dependent - Patient 0%      Wheelchair 50 feet with 2 turns activity    Assist        Assist Level: Dependent - Patient 0%   Wheelchair 150 feet activity     Assist      Assist Level: Dependent - Patient 0%   Blood pressure 134/60, pulse 84, temperature 99.8 F (37.7 C), resp. rate 18, height 5' 6.5 (1.689 m), weight (S) 53.8 kg, SpO2 100%.  Medical Problem List and Plan: 1. Functional deficits secondary to  bilateral embolic shower, embolic pattern, etiology:  Afib and missed Eliquis  doses vs from NSTEMI with cardiac cath.              -patient may  shower             -ELOS/Goals: 12-15 days min A-              Cannot sit upright without propping            -Continue CIR therapies including PT, OT, and SLP   - Ordered PRAFO/WHO right upper extremity, sling for right upper extremity also ordered  -Will plan for palliative consult for goals of care - notified daughter and pt, pt seen yesterday, appreciate assistance  - Recheck CBC and BMP today due to reports of fatigue but overall appears around her baseline, Check CXR- later in afternoon pt reported to nursing/therapy she had cough in AM that resolved, per therapy patient reporting she feels she may have pneumonia - says she's had it before and feels like it's the same , Will add U/A  -possibly mood related?, she had recent goals of care discussion   10-25: Chest x-ray unchanged, increasingly hyponatremic.  Increase IV fluids to 75 cc/h, urinalysis pending.  10-26: Lethargy improving, more  engaged today. 11-22-26 lethargy remains improved(back to basline), consider SNF recommendation  Team conference today please see physician documentation under team conference tab, met with team  to discuss problems,progress, and goals. Formulized individual treatment plan based on medical history, underlying problem and comorbidities.  -Family Looking into SNF options- think this is a good idea  2.  Antithrombotics: -DVT/anticoagulation:  Pharmaceutical: Eliquis  Transitioned from IV Heparin  to Eliquis  10/14             -antiplatelet therapy: Asprin 81 mg  3. Pain Management: Gabapentin  and Tylenol  as needed  -Voltaren  R shoulder for R shoulder pain  - 10/17 patient now reporting some pain in her left shoulder, suspect from increased use with therapy.  Right shoulder doing better.  Will try Voltaren  gel to left shoulder, consider imaging if does not improve  10/18 added kpad for neck/shoulder as well  -10/22-23 Continue voltaren  gel for occasional shoulder pain  - Can try K-pad for neck as she has some soreness this morning  11-19-25 no complaints of pain  10/28, intermittent shoulder pain, had pain yesterday and had rapid response called yesterday to r/o cardiac event and had EKG/Trop, was found to have pain in trapezius muscle. Xray L shoulder, try lidocaine  patch  10/30 neck pain reported this AM, spoke with nursing early afternoon, pain improved with heatpack/repositioning and voltaren  gel  10/31 could consider tramadol  PRN but will hold off for now due to risk of sedation  4. Mood/Behavior/Sleep: LCSW to follow for evaluation and support when available.              -antipsychotic agents: N/A 5. Neuropsych/cognition: This patient is not quite capable of making decisions on her own behalf. 6. Skin/Wound Care: routine pressure relief measures.  7. Fluids/Electrolytes/Nutrition: monitor intake and output. Follow up labs in a.m.              - SLP consult: Carb modified diet +  Ensure  -nutrition consult for decreased p.o. intake  10/18 intake remains minimal. RD following   -added megace for appetitte  10/19 obsv today. Recheck labs, prealbumin in AM   10/20 Prealbumin 21 this AM, continue to monitor PO intake  10/22 Hx of pain with swallowing, appears has been chronic for several, nursing reports daughter said yesterday she was seen by GI in the past. Started PPI  10/23 called GI for consult- she has been seen by [facility] outpatient  10/24 Seen by GI appreciate- Barium swallow, Sucralfate   She ate 40-50% meals yesterday, poor intake but a little better  11-19-25: Eating 25 to 50% of meals  Seen by nutrition- for severe malnutrition, appreciate assistance  10/29 poor po intake continues, continue to encouarge  10/31 continues to have suboptimal PO intake.  Continue to encourage supplements. 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 Continue aspirin  and statin.   9. CAD: 10/4-10/7 admitted for NSTEMI  10. HLD: Lipitor 40 mg  11. HTN: monitor blood pressures on  Hyzaar, Cardizem , and Aldactone      11/26/2023    4:39 AM 11/25/2023    8:25 PM 11/25/2023    2:03 PM  Vitals with BMI  Systolic 134 128 861  Diastolic 60 45 43  Pulse 84 79 72  - 10/27-31 BP stable, continue current  12. Paroxymal Atrial Flutter/Fib: continue Eliquis  2.5 bid  13. T2DM: HgbA1c 6.0, resumed home Amaryl   - monitor cbg AC/HS with SSI.  CBG (last 3)  Recent Labs    11/25/23 2116 11/26/23 0632 11/26/23 1128  GLUCAP 157* 116* 131*  - 10/15 Will order CBG/SSI  - CBGs well-controlled, holding amaryl . But not eating much  -10/31 CBGs remain stable   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  Hypothyroidism: Synthroid  17.  Chronic anemia: Stable, monitor CBC.   -10/17 Hgb improved to 10 yesterday  -10/18 lower HGB at 8.4 suspect dilutional due to IVF, recheck  tomorrow  - 10-26: Hemoglobin from 10-8.2.  May be dilutional due to IV fluids, will get iron studies and FOBT to evaluate --FOBT positive, hesitant to hold [facility] given recent embolic strokes, will increase GI prophylaxis to 40 mg twice daily and defer AC discussion to primary team.  Trend hemoglobin. -10/27 stable overall at 8.2, appears to fluctuate, continue to monitor for now for reasons as above  -10/30 slightly lower at 7.9, will call GI Dr. 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  Continue to monitor  -10/20 NA 131 today, continue to monitor   -10/23 NA stable 131  10-25: Sodium 128 yesterday, repeat today similar.  Serum osmole's within normal limits, increasing IV fluids from 50 to 75 cc/h overnight and repeat labs in AM.  Placed 1200 cc p.o. fluid restriction.  Urine studies pending.  10-26: Sodium remains 128 despite IV fluids; urine/serum osmole's indicative of SIADH. ?  Due to lung pathology, DC trazodone , DC IV fluids, start salt tabs 1 g 2 times daily and repeat labs in AM.  10/29 NA stable 130, continue current   10/30 NA 129, overall stable. Appears hyponatremia has been chronic for several years.   -10/31, Na stable overall, appears has been low for many years, discuss with nephrology Dr. Melia, continue to monitor as long in stable range, will check TSH/cortisol next labs  21. Azotemia  -10/20 IVF NS 50ml/hr  -10/23 BUN and CR improved, continue IVF at night for poor oral intake, can hold if drinks 1L  10-25: Creatinine increased to 1.02; increasing IV fluids as above  10-26: Creatinine 0.94, DC IV fluids due to SIADH as above.  -10/27 Cr/BUN   trending up as IVF stopped, recheck tomorrow  -10/30 Bun 20, 0.93 Cr stable overall   Continue to monitor   22.  Urinary incontinence/UTI  - 10-25 urinalysis mildly positive; start Keflex  500 mg twice daily for 7 days; follow sensitivities- Ecoli sensitive to cefazolin - continue current regimen  23.  Right hemiparesis/spasticity  - 10-26: Due to cognitive issues and lethargy, hesitant to schedule muscle relaxers.  Start baclofen 5 mg 3 times daily as needed.  -Continue PRN as above to avoid sedation  LOS: 17 days A FACE TO FACE EVALUATION WAS PERFORMED  Kulakowski Collier 11/26/2023, 1:23 PM

## 2023-11-26 NOTE — Progress Notes (Signed)
 Occupational Therapy Session Note  Patient Details  Name: ADIEL MCNAMARA MRN: [id number] Date of Birth: 1940/06/04  Today's Date: 11/26/2023 OT Individual Time: [phone]      Short Term Goals: Week 3:  OT Short Term Goal 1 (Week 3): patient will demonstrate toilet transfers with maxA OT Short Term Goal 2 (Week 3): patient will complete LB donning with max A of 1 OT Short Term Goal 3 (Week 3): patient be able to complete static standing in prep for ADLS with max A OT Short Term Goal 4 (Week 3): patient will complete toileting on BSC or toilet with max A  Skilled Therapeutic Interventions/Progress Updates:  Patient agreeable to participate in OT session. Reports pain level in neck, no number given however reached for area and shook head for pain. Nursing administered medication.   Patient participated in skilled OT session focusing on alertness, sitting balance,, activity tolerance, and standing. Patient received in bed. Max A to EOB. Patient mod to max A to sit upright. OT completed contracture management on RUE elbow due to tightness of biceps tendon. Patient elbow able to be passively moved through ROM -10 in extension. Patient then able to complete standing x4 for 10 seconds with max A x2 to total A x2 with facilitation at chest and buttocks for upright posture and verbal cueing for looking forward. OT additionally facilitated LLE due to buckling. Patient then returned to supine. Total A x2 to scoot up in bed.   Therapy Documentation Precautions:  Precautions Precautions: Fall Recall of Precautions/Restrictions: Intact Precaution/Restrictions Comments: SBP <180, R hemiparesis Required Braces or Orthoses: Other Brace Other Brace: R LE PRAFO, R UE sling, R UE resting hand splint Restrictions Weight Bearing Restrictions Per Provider Order: No  Therapy/Group: Individual Therapy  D'mariea L Yaslin Kirtley 11/26/2023, 8:07 AM

## 2023-11-27 ENCOUNTER — Inpatient Hospital Stay (HOSPITAL_COMMUNITY)

## 2023-11-27 DIAGNOSIS — B370 Candidal stomatitis: Secondary | ICD-10-CM

## 2023-11-27 LAB — GLUCOSE, CAPILLARY
Glucose-Capillary: 91 mg/dL (ref 70–99)
Glucose-Capillary: 120 mg/dL — ABNORMAL HIGH (ref 70–99)
Glucose-Capillary: 105 mg/dL — ABNORMAL HIGH (ref 70–99)
Glucose-Capillary: 142 mg/dL — ABNORMAL HIGH (ref 70–99)

## 2023-11-27 MED ORDER — TRAMADOL HCL 50 MG PO TABS
50.0000 mg | ORAL_TABLET | Freq: Four times a day (QID) | ORAL | Status: DC | PRN
  Administered 2023-11-28 – 2023-12-06 (×8): 50 mg via ORAL
  Filled 2023-11-27 (×10): qty 1

## 2023-11-27 MED ORDER — NYSTATIN 100000 UNIT/ML MT SUSP
5.0000 mL | Freq: Four times a day (QID) | OROMUCOSAL | Status: DC
  Administered 2023-11-27 – 2023-12-09 (×33): 500000 [IU] via OROMUCOSAL
  Filled 2023-11-27 (×34): qty 5

## 2023-11-27 NOTE — Progress Notes (Addendum)
 1                                                        PROGRESS NOTE    Subjective/Complaints: Continues to reports neck discomfort.  PO intake suboptimal.    ROS: Limited due to cognitive/behavioral. Denies CP/SOB, Nausea, Vomiting, abdominal pain + Painful swallowing - continued + L shoulder pain-continued + neck pain-continued   Objective:   No results found.   Recent Labs    11/25/23 0452  WBC 6.1  HGB 7.9*  HCT 23.2*  PLT 120*    Recent Labs    11/25/23 0452  NA 129*  K 4.4  CL 98  CO2 20*  GLUCOSE 105*  BUN 20  CREATININE 0.93  CALCIUM  9.2     Intake/Output Summary (Last 24 hours) at 11/27/2023 1116 Last data filed at 11/27/2023 0825 Gross per 24 hour  Intake 478 ml  Output --  Net 478 ml        Physical Exam: Vital Signs Blood pressure (!) 129/54, pulse 69, temperature 98.4 F (36.9 C), temperature source Oral, resp. rate 19, height 5' 6.5 (1.689 m), weight 55 kg, SpO2 100%.   Constitutional: No distress . Vital signs reviewed.  Sitting up in bed. Appears chronically ill. HEENT: NCAT, EOMI, oral membranes dry, small amount trush noted Neck: supple, heading pad behind her neck again today Cardiovascular: RRR without murmur. No JVD    Respiratory/Chest: CTA Bilaterally without wheezes or rales. Normal effort    GI/Abdomen: BS +, non-tender, non-distended Ext: no clubbing, cyanosis, or edema Psych: flat but cooperative  Skin: Clean and intact without signs of breakdown  Neuro: Awake and alert,  R inattention, delayed responses  Decreased sensation light touch right lower extremity.  Decreased to light touch on RLE< but not RUE or face  RUE- tr/5 ff x1, unable to repeat; 0/5 otherwise  LUE 4+/5 throughout RLE- HF 2/5; KE/KF 2/5; DF 0/5; PF 2-/5 LLE- 4+/5 throughout  Tone: MAS 2 right shoulder, MAS 3 right elbow, MAS 0 wrists, fingers, MAS 2 knee flexors  Prior neuro assessment is c/w today's exam 11/27/2023.   Musculoskeletal: Mild  tenderness Mid left C spine paraspinal muscles, head forward posture, minimal pain L shoulder rom, minimal L trapezius tenderness     Assessment/Plan: 1. Functional deficits which require 3+ hours per day of interdisciplinary therapy in a comprehensive inpatient rehab setting. Physiatrist is providing close team supervision and 24 hour management of active medical problems listed below. Physiatrist and rehab team continue to assess barriers to discharge/monitor patient progress toward functional and medical goals  Care Tool:  Bathing    Body parts bathed by patient: Right arm, Left arm, Chest, Abdomen, Front perineal area   Body parts bathed by helper: Abdomen, Chest, Front perineal area, Buttocks, Right upper leg, Left upper leg, Right lower leg, Left lower leg, Face     Bathing assist Assist Level: Total Assistance - Patient < 25%     Upper Body Dressing/Undressing Upper body dressing   What is the patient wearing?: Hospital gown only, Pull over shirt    Upper body assist Assist Level: Total Assistance - Patient < 25%    Lower Body Dressing/Undressing Lower body dressing      What is the patient wearing?: Underwear/pull up     Lower  body assist Assist for lower body dressing: Total Assistance - Patient < 25%     Toileting Toileting    Toileting assist Assist for toileting: Dependent - Patient 0%     Transfers Chair/bed transfer  Transfers assist  Chair/bed transfer activity did not occur: Safety/medical concerns (fatigue)  Chair/bed transfer assist level: Maximal Assistance - Patient 25 - 49%     Locomotion Ambulation   Ambulation assist   Ambulation activity did not occur: Safety/medical concerns (weakness/fatigue/pain)          Walk 10 feet activity   Assist  Walk 10 feet activity did not occur: Safety/medical concerns        Walk 50 feet activity   Assist Walk 50 feet with 2 turns activity did not occur: Safety/medical concerns          Walk 150 feet activity   Assist Walk 150 feet activity did not occur: Safety/medical concerns         Walk 10 feet on uneven surface  activity   Assist Walk 10 feet on uneven surfaces activity did not occur: Safety/medical concerns         Wheelchair     Assist Is the patient using a wheelchair?: Yes Type of Wheelchair: Manual    Wheelchair assist level: Dependent - Patient 0%      Wheelchair 50 feet with 2 turns activity    Assist        Assist Level: Dependent - Patient 0%   Wheelchair 150 feet activity     Assist      Assist Level: Dependent - Patient 0%   Blood pressure (!) 129/54, pulse 69, temperature 98.4 F (36.9 C), temperature source Oral, resp. rate 19, height 5' 6.5 (1.689 m), weight 55 kg, SpO2 100%.  Medical Problem List and Plan: 1. Functional deficits secondary to  bilateral embolic shower, embolic pattern, etiology:  Afib and missed Eliquis  doses vs from NSTEMI with cardiac cath.              -patient may  shower             -ELOS/Goals: 12-15 days min A-              Cannot sit upright without propping            -Continue CIR therapies including PT, OT, and SLP   - Ordered PRAFO/WHO right upper extremity, sling for right upper extremity also ordered  -Will plan for palliative consult for goals of care - notified daughter and pt, pt seen yesterday, appreciate assistance  - Recheck CBC and BMP today due to reports of fatigue but overall appears around her baseline, Check CXR- later in afternoon pt reported to nursing/therapy she had cough in AM that resolved, per therapy patient reporting she feels she may have pneumonia - says she's had it before and feels like it's the same , Will add U/A  -possibly mood related?, she had recent goals of care discussion   10-25: Chest x-ray unchanged, increasingly hyponatremic.  Increase IV fluids to 75 cc/h, urinalysis pending.  10-26: Lethargy improving, more engaged today. 11-22-26  lethargy remains improved(back to basline), consider SNF recommendation  -Looking for SNF - think this is likely a good option    2.  Antithrombotics: -DVT/anticoagulation:  Pharmaceutical: Eliquis  Transitioned from IV Heparin  to Eliquis  10/14             -antiplatelet therapy: Asprin 81 mg  3. Pain Management: Gabapentin  and Tylenol   as needed  -Voltaren  R shoulder for R shoulder pain  - 10/17 patient now reporting some pain in her left shoulder, suspect from increased use with therapy.  Right shoulder doing better.  Will try Voltaren  gel to left shoulder, consider imaging if does not improve  10/18 added kpad for neck/shoulder as well  -10/22-23 Continue voltaren  gel for occasional shoulder pain  - Can try K-pad for neck as she has some soreness this morning  11-19-25 no complaints of pain  10/28, intermittent shoulder pain, had pain yesterday and had rapid response called yesterday to r/o cardiac event and had EKG/Trop, was found to have pain in trapezius muscle. Xray L shoulder, try lidocaine  patch  10/30 neck pain reported this AM, spoke with nursing early afternoon, pain improved with heatpack/repositioning and voltaren  gel  11/1 tramadol  prn for severe pain, C spine Xray, she just got tylenol  few min before I saw her  4. Mood/Behavior/Sleep: LCSW to follow for evaluation and support when available.              -antipsychotic agents: N/A 5. Neuropsych/cognition: This patient is not quite capable of making decisions on her own behalf. 6. Skin/Wound Care: routine pressure relief measures.  7. Fluids/Electrolytes/Nutrition: monitor intake and output. Follow up labs in a.m.              - SLP consult: Carb modified diet + Ensure  -nutrition consult for decreased p.o. intake  10/18 intake remains minimal. RD following   -added megace for appetitte  10/19 obsv today. Recheck labs, prealbumin in AM   10/20 Prealbumin 21 this AM, continue to monitor PO intake  10/22 Hx of pain with  swallowing, appears has been chronic for several, nursing reports daughter said yesterday she was seen by GI in the past. Started PPI  10/23 called GI for consult- she has been seen by [facility] outpatient  10/24 Seen by GI appreciate- Barium swallow, Sucralfate   She ate 40-50% meals yesterday, poor intake but a little better  11-19-25: Eating 25 to 50% of meals  Seen by nutrition- for severe malnutrition, appreciate assistance  10/29 poor po intake continues, continue to encouarge  10/31-11/1 continues to have suboptimal PO intake.  Continue to encourage supplements. 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 Continue aspirin  and statin.   9. CAD: 10/4-10/7 admitted for NSTEMI  10. HLD: Lipitor 40 mg  11. HTN: monitor blood pressures on  Hyzaar, Cardizem , and Aldactone      11/27/2023    6:13 AM 11/27/2023    5:56 AM 11/26/2023    9:00 PM  Vitals with BMI  Weight 121 lbs 4 oz    BMI 19.28    Systolic  129 145  Diastolic  54 52  Pulse  69 79  -11/1 BP stable overall   12. Paroxymal Atrial Flutter/Fib: continue Eliquis  2.5 bid  13. T2DM: HgbA1c 6.0, resumed home Amaryl   - monitor cbg AC/HS with SSI.  CBG (last 3)  Recent Labs    11/26/23 1631 11/26/23 2159 11/27/23 0618  GLUCAP 104* 107* 91  - 10/15 Will order CBG/SSI  - CBGs well-controlled, holding amaryl . 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  Hypothyroidism: Synthroid  17.  Chronic anemia: Stable, monitor CBC.   -10/17 Hgb improved to 10 yesterday  -10/18 lower HGB at 8.4 suspect  dilutional due to IVF, recheck tomorrow  - 10-26: Hemoglobin from 10-8.2.  May be dilutional due to IV fluids, will get iron studies and FOBT to evaluate --FOBT positive, hesitant to hold [facility] given recent embolic strokes, will increase GI prophylaxis to 40 mg twice daily and defer AC  discussion to primary team.  Trend hemoglobin. -10/27 stable overall at 8.2, appears to fluctuate, continue to monitor for now for reasons as above  -10/30 slightly lower at 7.9, will call GI Dr. Fayne- called left voicemail with my callback information -11/1 discussed with GI Dr. 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  Continue to monitor  -10/20 NA 131 today, continue to monitor   -10/23 NA stable 131  10-25: Sodium 128 yesterday, repeat today similar.  Serum osmole's within normal limits, increasing IV fluids from 50 to 75 cc/h overnight and repeat labs in AM.  Placed 1200 cc p.o. fluid restriction.  Urine studies pending.  10-26: Sodium remains 128 despite IV fluids; urine/serum osmole's indicative of SIADH. ?  Due to lung pathology, DC trazodone , DC IV fluids, start salt tabs 1 g 2 times daily and repeat labs in AM.  10/29 NA stable 130, continue current   10/30 NA 129, overall stable. Appears hyponatremia has been chronic for several years.   -10/31, Na stable overall, appears has been low for many years, discuss with nephrology Dr. Melia, continue to monitor as long in stable range, will check TSH/cortisol next labs  Recheck monday  21. Azotemia  -10/20 IVF NS 50ml/hr  -10/23 BUN and CR improved, continue IVF at night for poor oral intake, can hold if drinks 1L  10-25: Creatinine increased to 1.02; increasing IV fluids as above  10-26: Creatinine 0.94, DC IV fluids due to SIADH as above.  -10/27 Cr/BUN  trending up as IVF stopped, recheck tomorrow  -10/30 Bun 20, 0.93 Cr stable overall    Recheck monday  22.  Urinary incontinence/UTI  - 10-25 urinalysis mildly positive; start Keflex  500 mg twice daily for 7 days; follow sensitivities- Ecoli sensitive to cefazolin - continue current regimen  23.  Right hemiparesis/spasticity  - 10-26: Due to cognitive issues and lethargy, hesitant to schedule muscle relaxers.  Start baclofen 5 mg 3 times daily as needed.  -Continue PRN as above to avoid sedation  24. Thrush  -Start Nystatin   LOS: 18 days A FACE TO FACE EVALUATION WAS PERFORMED  Hurta Collier 11/27/2023, 11:16 AM

## 2023-11-27 NOTE — Plan of Care (Signed)
  Problem: Consults Goal: RH STROKE PATIENT EDUCATION Description: See Patient Education module for education specifics  Outcome: Progressing   Problem: RH SAFETY Goal: RH STG ADHERE TO SAFETY PRECAUTIONS W/ASSISTANCE/DEVICE Description: STG Adhere to Safety Precautions With cues Assistance/Device. Outcome: Progressing   Problem: RH KNOWLEDGE DEFICIT Goal: RH STG INCREASE KNOWLEDGE OF DIABETES Description: Patient and dtr will be able to manage DM using educational resources for medications and dietary modification independently Outcome: Progressing Goal: RH STG INCREASE KNOWLEDGE OF HYPERTENSION Description: Patient and dtr will be able to manage HTN using educational resources for medications and dietary modification independently Outcome: Progressing Goal: RH STG INCREASE KNOWLEGDE OF HYPERLIPIDEMIA Description: Patient and dtr will be able to manage HLD using educational resources for medications and dietary modification independently Outcome: Progressing Goal: RH STG 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 Description: Patient and dtr will be able to manage secondary risks using educational resources for medications and dietary modification independently Outcome: Progressing   Problem: Education: Goal: Ability to describe self-care measures that may prevent or decrease complications (Diabetes Survival Skills Education) will improve Outcome: Progressing Goal: Individualized Educational Video(s) Outcome: Progressing   Problem: Coping: Goal: Ability to adjust to condition or change in health will improve Outcome: Progressing   Problem: Fluid Volume: Goal: Ability to maintain a balanced intake and output will improve Outcome: Progressing   Problem: Health Behavior/Discharge Planning: Goal: Ability to identify and utilize available resources and services will improve Outcome: Progressing Goal: Ability to manage health-related needs will improve Outcome: Progressing    Problem: Metabolic: Goal: Ability to maintain appropriate glucose levels will improve Outcome: Progressing   Problem: Nutritional: Goal: Maintenance of adequate nutrition will improve Outcome: Progressing Goal: Progress toward achieving an optimal weight will improve Outcome: Progressing   Problem: Skin Integrity: Goal: Risk for impaired skin integrity will decrease Outcome: Progressing   Problem: Tissue Perfusion: Goal: Adequacy of tissue perfusion will improve Outcome: Progressing

## 2023-11-28 LAB — CBC
HCT: 23.9 % — ABNORMAL LOW (ref 36.0–46.0)
Hemoglobin: 8.1 g/dL — ABNORMAL LOW (ref 12.0–15.0)
MCH: 29.6 pg (ref 26.0–34.0)
MCHC: 33.9 g/dL (ref 30.0–36.0)
MCV: 87.2 fL (ref 80.0–100.0)
Platelets: 109 K/uL — ABNORMAL LOW (ref 150–400)
RBC: 2.74 MIL/uL — ABNORMAL LOW (ref 3.87–5.11)
RDW: 18.5 % — ABNORMAL HIGH (ref 11.5–15.5)
WBC: 7.1 K/uL (ref 4.0–10.5)
nRBC: 0 % (ref 0.0–0.2)

## 2023-11-28 LAB — GLUCOSE, CAPILLARY
Glucose-Capillary: 97 mg/dL (ref 70–99)
Glucose-Capillary: 109 mg/dL — ABNORMAL HIGH (ref 70–99)
Glucose-Capillary: 101 mg/dL — ABNORMAL HIGH (ref 70–99)
Glucose-Capillary: 118 mg/dL — ABNORMAL HIGH (ref 70–99)

## 2023-11-28 NOTE — Progress Notes (Signed)
 1                                                        PROGRESS NOTE    Subjective/Complaints: Neck pain doing better today. Pt reports she feels sleepy. No additional concerns.    ROS: Limited due to cognitive/behavioral. Denies CP/SOB, Nausea, Vomiting, abdominal pain + Painful swallowing - improved + L shoulder pain-improved today + neck pain-improved  Objective:   DG Cervical Spine 2 or 3 views Result Date: 11/27/2023 CLINICAL DATA:  Left neck pain EXAM: CERVICAL SPINE - 2 VIEW COMPARISON:  CT cervical spine dated 06/19/2023 FINDINGS: There is no evidence of cervical spine fracture or prevertebral soft tissue swelling. Straightening of the cervical lordosis. Again seen are multilevel degenerative changes of the cervical spine characterized by intervertebral disc space narrowing and disc osteophyte formation. IMPRESSION: 1. No acute fracture or traumatic listhesis. 2. Unchanged multilevel degenerative changes of the cervical spine. Electronically Signed   By: Limin  Xu M.D.   On: 11/27/2023 15:12     Recent Labs    11/28/23 0716  WBC 7.1  HGB 8.1*  HCT 23.9*  PLT 109*    No results for input(s): NA, K, CL, CO2, GLUCOSE, BUN, CREATININE, CALCIUM  in the last 72 hours.    Intake/Output Summary (Last 24 hours) at 11/28/2023 1153 Last data filed at 11/28/2023 0751 Gross per 24 hour  Intake 483 ml  Output --  Net 483 ml        Physical Exam: Vital Signs Blood pressure (!) 138/45, pulse 73, temperature 98.6 F (37 C), temperature source Oral, resp. rate 18, height 5' 6.5 (1.689 m), weight 55 kg, SpO2 100%.   Constitutional: No distress . Vital signs reviewed.  Sitting up in bed. Appears chronically ill. HEENT: NCAT, EOMI, oral membranes dry, small amount thrush- improved Neck: supple, heading pad behind her neck again today Cardiovascular: RRR without murmur. No JVD    Respiratory/Chest: CTA Bilaterally without wheezes or rales. Normal effort     GI/Abdomen: BS +, non-tender, non-distended Ext: no clubbing, cyanosis, or edema Psych: flat but cooperative  Skin: Clean and intact without signs of breakdown  Neuro: Awake and alert,  R inattention, delayed responses  Decreased sensation light touch right lower extremity.  Continued RUE and RLE weakness  Tone: MAS 2 right shoulder, MAS 3 right elbow, MAS 0 wrists, fingers, MAS 2 knee flexors  Prior neuro assessment is c/w today's exam 11/28/2023.   Musculoskeletal: Mild tenderness Mid left C spine paraspinal muscles, head forward posture, minimal pain L shoulder rom, minimal L trapezius tenderness     Assessment/Plan: 1. Functional deficits which require 3+ hours per day of interdisciplinary therapy in a comprehensive inpatient rehab setting. Physiatrist is providing close team supervision and 24 hour management of active medical problems listed below. Physiatrist and rehab team continue to assess barriers to discharge/monitor patient progress toward functional and medical goals  Care Tool:  Bathing    Body parts bathed by patient: Right arm, Left arm, Chest, Abdomen, Front perineal area   Body parts bathed by helper: Abdomen, Chest, Front perineal area, Buttocks, Right upper leg, Left upper leg, Right lower leg, Left lower leg, Face     Bathing assist Assist Level: Total Assistance - Patient < 25%     Upper Body Dressing/Undressing Upper  body dressing   What is the patient wearing?: Hospital gown only, Pull over shirt    Upper body assist Assist Level: Total Assistance - Patient < 25%    Lower Body Dressing/Undressing Lower body dressing      What is the patient wearing?: Underwear/pull up     Lower body assist Assist for lower body dressing: Total Assistance - Patient < 25%     Toileting Toileting    Toileting assist Assist for toileting: Dependent - Patient 0%     Transfers Chair/bed transfer  Transfers assist  Chair/bed transfer activity did not  occur: Safety/medical concerns (fatigue)  Chair/bed transfer assist level: Maximal Assistance - Patient 25 - 49%     Locomotion Ambulation   Ambulation assist   Ambulation activity did not occur: Safety/medical concerns (weakness/fatigue/pain)          Walk 10 feet activity   Assist  Walk 10 feet activity did not occur: Safety/medical concerns        Walk 50 feet activity   Assist Walk 50 feet with 2 turns activity did not occur: Safety/medical concerns         Walk 150 feet activity   Assist Walk 150 feet activity did not occur: Safety/medical concerns         Walk 10 feet on uneven surface  activity   Assist Walk 10 feet on uneven surfaces activity did not occur: Safety/medical concerns         Wheelchair     Assist Is the patient using a wheelchair?: Yes Type of Wheelchair: Manual    Wheelchair assist level: Dependent - Patient 0%      Wheelchair 50 feet with 2 turns activity    Assist        Assist Level: Dependent - Patient 0%   Wheelchair 150 feet activity     Assist      Assist Level: Dependent - Patient 0%   Blood pressure (!) 138/45, pulse 73, temperature 98.6 F (37 C), temperature source Oral, resp. rate 18, height 5' 6.5 (1.689 m), weight 55 kg, SpO2 100%.  Medical Problem List and Plan: 1. Functional deficits secondary to  bilateral embolic shower, embolic pattern, etiology:  Afib and missed Eliquis  doses vs from NSTEMI with cardiac cath.              -patient may  shower             -ELOS/Goals: 12-15 days min A-              Cannot sit upright without propping            -Continue CIR therapies including PT, OT, and SLP   - Ordered PRAFO/WHO right upper extremity, sling for right upper extremity also ordered  -Will plan for palliative consult for goals of care - notified daughter and pt, pt seen yesterday, appreciate assistance  - Recheck CBC and BMP today due to reports of fatigue but overall appears  around her baseline, Check CXR- later in afternoon pt reported to nursing/therapy she had cough in AM that resolved, per therapy patient reporting she feels she may have pneumonia - says she's had it before and feels like it's the same , Will add U/A  -possibly mood related?, she had recent goals of care discussion   10-25: Chest x-ray unchanged, increasingly hyponatremic.  Increase IV fluids to 75 cc/h, urinalysis pending.  10-26: Lethargy improving, more engaged today. 11-22-26 lethargy remains improved(back to basline), consider  SNF recommendation  -Plan for SNF disccharge    2.  Antithrombotics: -DVT/anticoagulation:  Pharmaceutical: Eliquis  Transitioned from IV Heparin  to Eliquis  10/14             -antiplatelet therapy: Asprin 81 mg  3. Pain Management: Gabapentin  and Tylenol  as needed  -Voltaren  R shoulder for R shoulder pain  - 10/17 patient now reporting some pain in her left shoulder, suspect from increased use with therapy.  Right shoulder doing better.  Will try Voltaren  gel to left shoulder, consider imaging if does not improve  10/18 added kpad for neck/shoulder as well  -10/22-23 Continue voltaren  gel for occasional shoulder pain  - Can try K-pad for neck as she has some soreness this morning  11-19-25 no complaints of pain  10/28, intermittent shoulder pain, had pain yesterday and had rapid response called yesterday to r/o cardiac event and had EKG/Trop, was found to have pain in trapezius muscle. Xray L shoulder, try lidocaine  patch  10/30 neck pain reported this AM, spoke with nursing early afternoon, pain improved with heatpack/repositioning and voltaren  gel  11/1 tramadol  prn for severe pain, C spine Xray, she just got tylenol  few min before I saw her  11/2 C spine xray with spondylosis, neck pain doing better today  4. Mood/Behavior/Sleep: LCSW to follow for evaluation and support when available.              -antipsychotic agents: N/A 5. Neuropsych/cognition: This  patient is not quite capable of making decisions on her own behalf. 6. Skin/Wound Care: routine pressure relief measures.  7. Fluids/Electrolytes/Nutrition: monitor intake and output. Follow up labs in a.m.              - SLP consult: Carb modified diet + Ensure  -nutrition consult for decreased p.o. intake  10/18 intake remains minimal. RD following   -added megace for appetitte  10/19 obsv today. Recheck labs, prealbumin in AM   10/20 Prealbumin 21 this AM, continue to monitor PO intake  10/22 Hx of pain with swallowing, appears has been chronic for several, nursing reports daughter said yesterday she was seen by GI in the past. Started PPI  10/23 called GI for consult- she has been seen by [facility] outpatient  10/24 Seen by GI appreciate- Barium swallow, Sucralfate   She ate 40-50% meals yesterday, poor intake but a little better  11-19-25: Eating 25 to 50% of meals  Seen by nutrition- for severe malnutrition, appreciate assistance  10/29 poor po intake continues, continue to encouarge  11/2 continue megace, encourage ensure. 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 Continue aspirin  and statin.   9. CAD: 10/4-10/7 admitted for NSTEMI  10. HLD: Lipitor 40 mg  11. HTN: monitor blood pressures on  Hyzaar, Cardizem , and Aldactone      11/27/2023    7:58 PM 11/27/2023    3:46 PM 11/27/2023    6:13 AM  Vitals with BMI  Weight   121 lbs 4 oz  BMI   19.28  Systolic 138 135   Diastolic 45 46   Pulse 73 83   -11/2 DBP a little low but stable overall, continue diltiazem   12. Paroxymal Atrial Flutter/Fib: continue Eliquis  2.5 bid  13. T2DM: HgbA1c 6.0, resumed home Amaryl   - monitor cbg AC/HS with SSI.  CBG (last 3)  Recent Labs    11/27/23 2127 11/28/23 0708 11/28/23 1117  GLUCAP 142* 97 109*  - 10/15 Will order CBG/SSI  -  CBGs well-controlled, holding amaryl . 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  Hypothyroidism: Synthroid  17.  Chronic anemia: Stable, monitor CBC.   -10/17 Hgb improved to 10 yesterday  -10/18 lower HGB at 8.4 suspect dilutional due to IVF, recheck tomorrow  - 10-26: Hemoglobin from 10-8.2.  May be dilutional due to IV fluids, will get iron studies and FOBT to evaluate --FOBT positive, hesitant to hold [facility] given recent embolic strokes, will increase GI prophylaxis to 40 mg twice daily and defer AC discussion to primary team.  Trend hemoglobin. -10/27 stable overall at 8.2, appears to fluctuate, continue to monitor for now for reasons as above  -10/30 slightly lower at 7.9, will call GI Dr. Fayne- called left voicemail with my callback information -11/1 discussed with GI Dr. 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  Continue to monitor  -10/20 NA 131 today, continue to monitor   -10/23 NA stable 131  10-25: Sodium 128 yesterday, repeat today similar.  Serum osmole's within normal limits, increasing IV fluids from 50 to 75 cc/h overnight and repeat labs in AM.  Placed 1200 cc p.o. fluid restriction.  Urine studies pending.  10-26: Sodium remains 128 despite IV fluids; urine/serum osmole's indicative of SIADH. ?  Due to lung pathology, DC trazodone , DC IV fluids, start salt tabs 1 g 2 times daily and repeat labs in AM.  10/29 NA stable 130, continue current   10/30 NA 129, overall stable. Appears hyponatremia has been chronic for several years.   -10/31, Na  stable overall, appears has been low for many years, discuss with nephrology Dr. Melia, continue to monitor as long in stable range, will check TSH/cortisol next labs  Recheck BMP/check TSH/Cortisol Monday. Hyponatremia has been chronic for years, dont think she needs fluid restrictions  21. Azotemia  -10/20 IVF NS 50ml/hr  -10/23 BUN and CR improved, continue IVF at night for poor oral intake, can hold if drinks 1L  10-25: Creatinine increased to 1.02; increasing IV fluids as above  10-26: Creatinine 0.94, DC IV fluids due to SIADH as above.  -10/27 Cr/BUN  trending up as IVF stopped, recheck tomorrow  -10/30 Bun 20, 0.93 Cr stable overall   Recheck tomorrow  22.  Urinary incontinence/UTI  - 10-25 urinalysis mildly positive; start Keflex  500 mg twice daily for 7 days; follow sensitivities- Ecoli sensitive to cefazolin - continue current regimen  23.  Right hemiparesis/spasticity  - 10-26: Due to cognitive issues and lethargy, hesitant to schedule muscle relaxers.  Start baclofen 5 mg 3 times daily as needed.  -Continue PRN as above to avoid sedation  -Consider outpatient botox  24. Thrush  -Start Nystatin   -11/2 improved  LOS: 19 days A FACE TO FACE EVALUATION WAS PERFORMED  Buxbaum Collier 11/28/2023, 11:53 AM

## 2023-11-28 NOTE — Progress Notes (Signed)
 Occupational Therapy Session Note  Patient Details  Name: SHALISA MCQUADE MRN: [id number] Date of Birth: [birth year]  Today's Date: 11/28/2023 OT Individual Time: 1345-1400 OT Individual Time Calculation (min): 15 min  and Today's Date: 11/28/2023 OT Missed Time: 30 Minutes Missed Time Reason: Patient fatigue (would not/could not keep eyes open to participate)   Short Term Goals: Week 3:  OT Short Term Goal 1 (Week 3): patient will demonstrate toilet transfers with maxA OT Short Term Goal 2 (Week 3): patient will complete LB donning with max A of 1 OT Short Term Goal 3 (Week 3): patient be able to complete static standing in prep for ADLS with max A OT Short Term Goal 4 (Week 3): patient will complete toileting on BSC or toilet with max A  Skilled Therapeutic Interventions/Progress Updates:    Pt resting in bed with eyes closed upon arrival. Pt required mod verbal cues for pt to partially open eyes. Pt mouthed yes when questioned regarding sleepiness. Attempted to engage but pt did not/would not respond to commands and/or further questions. Pt missed 30 mins skilled OT services 2/2 lethargy. Will attempt to reschedule as time allows.   Therapy Documentation Precautions:  Precautions Precautions: Fall Recall of Precautions/Restrictions: Intact Precaution/Restrictions Comments: SBP <180, R hemiparesis Required Braces or Orthoses: Other Brace Other Brace: R LE PRAFO, R UE sling, R UE resting hand splint Restrictions Weight Bearing Restrictions Per Provider Order: No General: General OT Amount of Missed Time: 30 Minutes   Pain: Pt did not verbalize any pain when questioned  Therapy/Group: Individual Therapy  Maritza Debby Mare 11/28/2023, 2:07 PM

## 2023-11-28 NOTE — Plan of Care (Signed)
  Problem: Consults Goal: RH STROKE PATIENT EDUCATION Description: See Patient Education module for education specifics  Outcome: Progressing   Problem: RH SAFETY Goal: RH STG ADHERE TO SAFETY PRECAUTIONS W/ASSISTANCE/DEVICE Description: STG Adhere to Safety Precautions With cues Assistance/Device. Outcome: Progressing   Problem: RH KNOWLEDGE DEFICIT Goal: RH STG INCREASE KNOWLEDGE OF DIABETES Description: Patient and dtr will be able to manage DM using educational resources for medications and dietary modification independently Outcome: Progressing Goal: RH STG INCREASE KNOWLEDGE OF HYPERTENSION Description: Patient and dtr will be able to manage HTN using educational resources for medications and dietary modification independently Outcome: Progressing Goal: RH STG INCREASE KNOWLEGDE OF HYPERLIPIDEMIA Description: Patient and dtr will be able to manage HLD using educational resources for medications and dietary modification independently Outcome: Progressing Goal: RH STG 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 Description: Patient and dtr will be able to manage secondary risks using educational resources for medications and dietary modification independently Outcome: Progressing   Problem: Education: Goal: Ability to describe self-care measures that may prevent or decrease complications (Diabetes Survival Skills Education) will improve Outcome: Progressing Goal: Individualized Educational Video(s) Outcome: Progressing   Problem: Coping: Goal: Ability to adjust to condition or change in health will improve Outcome: Progressing   Problem: Fluid Volume: Goal: Ability to maintain a balanced intake and output will improve Outcome: Progressing   Problem: Health Behavior/Discharge Planning: Goal: Ability to identify and utilize available resources and services will improve Outcome: Progressing Goal: Ability to manage health-related needs will improve Outcome: Progressing    Problem: Metabolic: Goal: Ability to maintain appropriate glucose levels will improve Outcome: Progressing   Problem: Nutritional: Goal: Maintenance of adequate nutrition will improve Outcome: Progressing Goal: Progress toward achieving an optimal weight will improve Outcome: Progressing   Problem: Skin Integrity: Goal: Risk for impaired skin integrity will decrease Outcome: Progressing   Problem: Tissue Perfusion: Goal: Adequacy of tissue perfusion will improve Outcome: Progressing

## 2023-11-29 LAB — BASIC METABOLIC PANEL WITH GFR
Anion gap: 10 (ref 5–15)
BUN: 18 mg/dL (ref 8–23)
CO2: 20 mmol/L — ABNORMAL LOW (ref 22–32)
Calcium: 9.3 mg/dL (ref 8.9–10.3)
Chloride: 100 mmol/L (ref 98–111)
Creatinine, Ser: 0.97 mg/dL (ref 0.44–1.00)
GFR, Estimated: 59 mL/min — ABNORMAL LOW (ref >60)
Glucose, Bld: 95 mg/dL (ref 70–99)
Potassium: 4.3 mmol/L (ref 3.5–5.1)
Sodium: 130 mmol/L — ABNORMAL LOW (ref 135–145)

## 2023-11-29 LAB — GLUCOSE, CAPILLARY
Glucose-Capillary: 98 mg/dL (ref 70–99)
Glucose-Capillary: 113 mg/dL — ABNORMAL HIGH (ref 70–99)
Glucose-Capillary: 122 mg/dL — ABNORMAL HIGH (ref 70–99)
Glucose-Capillary: 148 mg/dL — ABNORMAL HIGH (ref 70–99)

## 2023-11-29 LAB — CBC
HCT: 25.6 % — ABNORMAL LOW (ref 36.0–46.0)
Hemoglobin: 8.5 g/dL — ABNORMAL LOW (ref 12.0–15.0)
MCH: 29.4 pg (ref 26.0–34.0)
MCHC: 33.2 g/dL (ref 30.0–36.0)
MCV: 88.6 fL (ref 80.0–100.0)
Platelets: 113 K/uL — ABNORMAL LOW (ref 150–400)
RBC: 2.89 MIL/uL — ABNORMAL LOW (ref 3.87–5.11)
RDW: 18.5 % — ABNORMAL HIGH (ref 11.5–15.5)
WBC: 6.3 K/uL (ref 4.0–10.5)
nRBC: 0 % (ref 0.0–0.2)

## 2023-11-29 LAB — CORTISOL: Cortisol, Plasma: 2.2 ug/dL

## 2023-11-29 LAB — TSH: TSH: 2.688 u[IU]/mL (ref 0.350–4.500)

## 2023-11-29 MED ORDER — METHYLPHENIDATE HCL 5 MG PO TABS
5.0000 mg | ORAL_TABLET | Freq: Two times a day (BID) | ORAL | Status: DC
  Administered 2023-11-30 – 2023-12-02 (×5): 5 mg via ORAL
  Filled 2023-11-29 (×5): qty 1

## 2023-11-29 NOTE — Progress Notes (Signed)
 Speech Language Pathology Daily Session Note  Patient Details  Name: [name] MRN: [id number] Date of Birth: 09/14/1940  Today's Date: 11/29/2023 SLP Individual Time: 8669-8576 SLP Individual Time Calculation (min): 53 min and Today's Date: 11/29/2023 SLP Missed Time: 7 Minutes Missed Time Reason: Patient fatigue  Short Term Goals: Week 3: SLP Short Term Goal 1 (Week 3): STG = LTG due to ELOS  Skilled Therapeutic Interventions:   Pt greeted at bedside for tx targeting cognition, communication, and dysphagia. RNT present and was setting up her tray for noon meal - SLP relieved her. The pt appeared visibly fatigued, but was able to respond yes to wanting lunch. She required maxA to initiate responses d/t fatigue and totalA for self feeding. Pt required a notable amount of time for mastication, but was eventually demonstrated adequate clearance of her first trial of Dys 3 textures. SLP then provided magic cup (Dys 1) instead given fatigue. She still appeared to require additional time for swallow initiation and required frequent breaks in between trials. SLP attempted conversation w/ patient to assist w/ attention, but she continuously required maxA to respond. Minimal vocal intensity noted and she was judged to be ~50% intelligible bc of this. She did spontaneously state I want some more food, however, trials were discontinued after pt fell asleep while masticating her bite of meatloaf. She required maxA to finally initiate her swallow and clear oral cavity. No overt s/s of airway invasion were noted. Tx tasks were discontinued d/t fatigue and she was left semi reclined in bed w/ the alarm set and call light within reach. Nursing was encouraged to only provide PO intake when pt is FULLY awake/alert given reduced success this tx session. Recommend cont ST per POC.   Pain  None reported  Therapy/Group: Individual Therapy  [name] 11/29/2023, 4:47 PM

## 2023-11-29 NOTE — Plan of Care (Signed)
   Medical records reviewed. Patient having continued shoulder/neck pain, intermittently worsening at times.  Goal is for full code/full scope treatment and discharge to SNF for continued rehabilitation efforts. Patient awaits bed offers. Tenuous prognosis given lethargy and high risk for further decline.   PMT will continue to follow peripherally for needs.  Thank you for your referral and allowing PMT to assist in [name]'s care.   Betsabe Iglesia, PA-C Palliative Medicine Team  Team Phone # [phone]   NO CHARGE

## 2023-11-29 NOTE — Progress Notes (Signed)
 Occupational Therapy Session Note  Patient Details  Name: [name] MRN: [id number] Date of Birth: 1940-04-23  Today's Date: 11/29/2023 OT Individual Time: 1303-1330 OT Individual Time Calculation (min): 27 min    Short Term Goals: Week 2:  OT Short Term Goal 1 (Week 2): patient will demonstrate toilet transfers with maxA OT Short Term Goal 1 - Progress (Week 2): Progressing toward goal OT Short Term Goal 2 (Week 2): patient will complete LB donning with max A of 1 OT Short Term Goal 2 - Progress (Week 2): Progressing toward goal OT Short Term Goal 3 (Week 2): patient be able to complete static standing in prep for ADLS with max A OT Short Term Goal 3 - Progress (Week 2): Progressing toward goal  Skilled Therapeutic Interventions/Progress Updates:    Pt received resting in bed with eyes closed, however receptive to skilled OT session reporting no pain, however difficult to assess d/t significant lethargy and fatigue- OT offering intermittent rest breaks, repositioning, and therapeutic support to optimize participation in therapy session. Focused this session on sitting balance and overall body awareness to improve righting and protective reactions. Supine>EOB with MAX A d/t fatigue. Pt completed 10 min sitting EOB with MOD A to maintain upright posture in static sitting while removing support to focus on protective and right responses with MAX-total A required to recover. Provided tactile cues for trunk flexion to facilitate stretching/ anterior weight shifting with MAX A. EOB>supine with MAX A. Provided PROM stretching to RUE and BLEs with MAX A to increase ROM, decrease pain, and prevent contractures. Provided pillows to facilitate optimal positioning. Completed face washing with wash cloth with MAX A to increase level of arousal. MAX multimodal cues required throughout the session to increase Pt alertness, however Pt only able to tolerate eyes open for 3 sec at a time. Pt was left resting in  bed with call bell in reach, bed alarm on, and all needs met.    Therapy Documentation Precautions:  Precautions Precautions: Fall Recall of Precautions/Restrictions: Intact Precaution/Restrictions Comments: SBP <180, R hemiparesis Required Braces or Orthoses: Other Brace Other Brace: R LE PRAFO, R UE sling, R UE resting hand splint Restrictions Weight Bearing Restrictions Per Provider Order: No   Therapy/Group: Individual Therapy  Darick Fetters Van Schaick 11/29/2023, 1:21 PM

## 2023-11-29 NOTE — Progress Notes (Signed)
 Occupational Therapy Session Note  Patient Details  Name: [name] MRN: [id number] Date of Birth: 10-Sep-1940  Today's Date: 11/29/2023 OT Individual Time: 0905-1000 OT Individual Time Calculation (min): 55 min    Short Term Goals: Week 3:  OT Short Term Goal 1 (Week 3): patient will demonstrate toilet transfers with maxA OT Short Term Goal 2 (Week 3): patient will complete LB donning with max A of 1 OT Short Term Goal 3 (Week 3): patient be able to complete static standing in prep for ADLS with max A OT Short Term Goal 4 (Week 3): patient will complete toileting on BSC or toilet with max A  Skilled Therapeutic Interventions/Progress Updates:   Pt greeted resting in bed, no reports of pain. Decreased verbal communication, presenting with increased lethargy. OT turns lights on, opens blinds, and plays gentle music to increase arousal. Gentle PROM provided prior to start of ADL tasks to manage potential discomfort. Bed-level sponge-bathing completed with Max-Total A, anticipate partly due to level of lethargy, Max multimodal cuing to encourage alertness. Pt with small smear BM/bladder incontinence. Pt remained resting in bed, positioning onto L-sidelying for pressure relief, and door open.   Therapy Documentation Precautions:  Precautions Precautions: Fall Recall of Precautions/Restrictions: Intact Precaution/Restrictions Comments: SBP <180, R hemiparesis Required Braces or Orthoses: Other Brace Other Brace: R LE PRAFO, R UE sling, R UE resting hand splint Restrictions Weight Bearing Restrictions Per Provider Order: No   Therapy/Group: Individual Therapy  Nereida Habermann, OTR/L, MSOT  11/29/2023, 8:48 AM

## 2023-11-29 NOTE — Progress Notes (Signed)
 Nutrition Follow-up  DOCUMENTATION CODES:   Severe malnutrition in context of chronic illness  INTERVENTION:  Pt's intake remains very poor; suspect pt is not meeting calorie and protein needs by mouth. With ongoing lethargy and inability to participate in rehab due to lethargy, discussion of goals is appropriate If intake does not improve, pt may benefit from nutrition support  Continue Ensure Plus High Protein po BID, each supplement provides 350 kcal and 20 grams of protein.  Continue feeding assistance and using dentures at meals  Encourage adequate intake that meets calorie and protein needs  Continue Magic cup TID with meals, each supplement provides 290 kcal and 9 grams of protein  NUTRITION DIAGNOSIS:   Severe Malnutrition related to chronic illness as evidenced by severe fat depletion, severe muscle depletion, percent weight loss, energy intake < or equal to 75% for > or equal to 1 month.  Remains applicable  GOAL:   Patient will meet greater than or equal to 90% of their needs, Weight gain  Progressing  MONITOR:   PO intake, Supplement acceptance, Weight trends  REASON FOR ASSESSMENT:   Consult Poor PO  ASSESSMENT:   PMH significant for DM2, recent dx lung CA undergoing radiation, breast CA s/p mastectomy, recent NSTEMI admission 10/4-10/7, Afib, CAD, HTN, interstitial lung disease, hypothyroidism. 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   Spoke with pt, tech present in room at time of assessment. NT reports she helped pt with breakfast this morning and was able to get pt to eat magic cup but only took bites of other items. Pt has been refusing Ensure supplements last few days. Encouraged pt to drink them when alert to help with calorie and protein intake. Pt very lethargic during assessment and could not verbalize any responses or keep eyes open. Expressed concern about current nutrition  status to pt and attempted to reach daughter listed in chart but unable to connect.  Recommend continuing to encourage eating at all meals and supplements. Unsure if pt does better at meals when family is present, but average recorded intake has declined since last assessment, now at 16% average intake per meal. Pt has been on appetite stimulant for a few weeks with no progress seen. Pt remains full code and GOC discussions ongoing. Discussions of nutrition support appear appropriate at this time if family wants to pursue aggressive care. Pt waiting on bed placement at SNF for continued rehab.  Average Meal Completion:  10/25-10/28 30% average intake x 8 recorded meals 10/31-11/2:16% average intake x 7 recorded meals   Medications: Ensure Plus BID  SSI 0-6 units TID Levothyroxine  Megace BID Liquid MVI Protonix  Miralax  Sodium chloride  tablet BID  Labs: CBG x 24 h: 98-118 mg/dL Sodium 869  Diet Order:   Diet Order             DIET DYS 3 Room service appropriate? Yes; Fluid consistency: Thin  Diet effective now                   EDUCATION NEEDS:   Education needs have been addressed  Skin:  Skin Assessment: Reviewed RN Assessment  Last BM:  10/27 type 6  Height:   Ht Readings from Last 1 Encounters:  11/09/23 5' 6.5 (1.689 m)    Weight:   Wt Readings from Last 1 Encounters:  11/27/23 55 kg    Ideal Body Weight:  61.4 kg  BMI:  Body mass index is 19.28 kg/m.  Estimated Nutritional Needs:   Kcal:  1700-1900 kcal/d  Protein:  80-100g/d  Fluid:  >/=1.7L/d    Josette Glance, MS, RDN, LDN Clinical Dietitian I Please reach out via secure chat

## 2023-11-29 NOTE — Progress Notes (Signed)
 Physical Therapy Session Note  Patient Details  Name: [name] MRN: [id number] Date of Birth: Nov 18, 1940  Today's Date: 11/29/2023 PT Individual Time: 1345-1430 PT Individual Time Calculation (min): 45 min   Short Term Goals: Week 3:  PT Short Term Goal 1 (Week 3): STG=LTG due to ELOS  Skilled Therapeutic Interventions/Progress Updates:      Pt lying in bed asleep with her daughter, [name], at the bedside. RN also present for med pass. Pt mostly nonverbal throughout session other than a few words here and there when pushed to talk. Daughter feels that her presentation is different than last week - appears more sleepy and less talkative. Discussed that her fatigue and lethargy has been ongoing since admission, but do agree that more nonverbal compared to past however unsure if this is behavioral or not.   Patient assisted to EOB with totalA (pt < 25%) with assist for BLE and trunk support. Encouraged patient to engage as much as she could for assistance and effort. Pt completed squat pivot transfer with totalA using over the back technique from bed to TIS w/c, towards her weaker R side.   Patient transported from main gym to ortho gym at w/c level and assisted to the mat table with similar assist level. Worked on sitting balance, upright tolerance, and reaching tasks. Pt initially able to sit with just CGA with adequate balance, however she was quick to fatigue and needing mod/maxA for static sitting balance after just a minute or 2. Worked on reaching to colored cones that were called out by PT - pt seems to have R visual deficits vs R inattention affecting her ability to locate cones outside of midline, on her R side. Pt continuing to require max to total cues for awakeness/alertness throughout the session.   Pt returned to her w/c and then back to her room. She was left reclined in TIS w/c with safety measures in place, daughter at bedside.   Therapy Documentation Precautions:   Precautions Precautions: Fall Recall of Precautions/Restrictions: Intact Precaution/Restrictions Comments: SBP <180, R hemiparesis Required Braces or Orthoses: Other Brace Other Brace: R LE PRAFO, R UE sling, R UE resting hand splint Restrictions Weight Bearing Restrictions Per Provider Order: No General:      Therapy/Group: Individual Therapy  [name] 11/29/2023, 7:40 AM

## 2023-11-29 NOTE — Progress Notes (Addendum)
 1                                                        PROGRESS NOTE    Subjective/Complaints: Pt had severe shoulder pain last night- got tramadol  due to crying from pain per nursing. - also used voltaren  gel and kpad.  Took AM meds but hard to wake up to give her meds.  Pt kept falling asleep for me- would nod head mainly no- and then ended with OK when I said I'd let her go back to sleep.   LBM last night.   ROS: Limited due to cognitive/behavioral  + Painful swallowing - improved + L shoulder pain-worse last night + neck pain-improved  Objective:   DG Cervical Spine 2 or 3 views Result Date: 11/27/2023 CLINICAL DATA:  Left neck pain EXAM: CERVICAL SPINE - 2 VIEW COMPARISON:  CT cervical spine dated 06/19/2023 FINDINGS: There is no evidence of cervical spine fracture or prevertebral soft tissue swelling. Straightening of the cervical lordosis. Again seen are multilevel degenerative changes of the cervical spine characterized by intervertebral disc space narrowing and disc osteophyte formation. IMPRESSION: 1. No acute fracture or traumatic listhesis. 2. Unchanged multilevel degenerative changes of the cervical spine. Electronically Signed   By: Limin  Xu M.D.   On: 11/27/2023 15:12     Recent Labs    11/28/23 0716 11/29/23 0516  WBC 7.1 6.3  HGB 8.1* 8.5*  HCT 23.9* 25.6*  PLT 109* 113*    Recent Labs    11/29/23 0516  NA 130*  K 4.3  CL 100  CO2 20*  GLUCOSE 95  BUN 18  CREATININE 0.97  CALCIUM  9.3      Intake/Output Summary (Last 24 hours) at 11/29/2023 9177 Last data filed at 11/28/2023 1829 Gross per 24 hour  Intake 268 ml  Output --  Net 268 ml        Physical Exam: Vital Signs Blood pressure (!) 136/44, pulse 68, temperature 98.3 F (36.8 C), resp. rate 16, height 5' 6.5 (1.689 m), weight 55 kg, SpO2 100%.    General: asleep- woke briefly- appears almost cachetic in face/body; did open eyes multiple times and nod yes/no, but not conversant;  NAD HENT: conjugate gaze when seen briefly; oropharynx dry CV: regular rate and rhythm; no JVD Pulmonary: CTA B/L; no W/R/R- good air movement GI: soft, NT, ND, (+)BS- hypoactive Psychiatric: extremely sleepy Neurological: woke briefly, but didn't stay awake   Skin: Clean and intact without signs of breakdown  Neuro: Awake and alert,  R inattention, delayed responses  Decreased sensation light touch right lower extremity.  Continued RUE and RLE weakness  Tone: MAS 2 right shoulder, MAS 3 right elbow, MAS 0 wrists, fingers, MAS 2 knee flexors  Prior neuro assessment is c/w today's exam 11/29/2023.   Musculoskeletal: Mild tenderness Mid left C spine paraspinal muscles, head forward posture, minimal pain L shoulder rom, minimal L trapezius tenderness     Assessment/Plan: 1. Functional deficits which require 3+ hours per day of interdisciplinary therapy in a comprehensive inpatient rehab setting. Physiatrist is providing close team supervision and 24 hour management of active medical problems listed below. Physiatrist and rehab team continue to assess barriers to discharge/monitor patient progress toward functional and medical goals  Care Tool:  Bathing    Body  parts bathed by patient: Right arm, Left arm, Chest, Abdomen, Front perineal area   Body parts bathed by helper: Abdomen, Chest, Front perineal area, Buttocks, Right upper leg, Left upper leg, Right lower leg, Left lower leg, Face     Bathing assist Assist Level: Total Assistance - Patient < 25%     Upper Body Dressing/Undressing Upper body dressing   What is the patient wearing?: Hospital gown only, Pull over shirt    Upper body assist Assist Level: Total Assistance - Patient < 25%    Lower Body Dressing/Undressing Lower body dressing      What is the patient wearing?: Underwear/pull up     Lower body assist Assist for lower body dressing: Total Assistance - Patient < 25%     Toileting Toileting     Toileting assist Assist for toileting: Dependent - Patient 0%     Transfers Chair/bed transfer  Transfers assist  Chair/bed transfer activity did not occur: Safety/medical concerns (fatigue)  Chair/bed transfer assist level: Maximal Assistance - Patient 25 - 49%     Locomotion Ambulation   Ambulation assist   Ambulation activity did not occur: Safety/medical concerns (weakness/fatigue/pain)          Walk 10 feet activity   Assist  Walk 10 feet activity did not occur: Safety/medical concerns        Walk 50 feet activity   Assist Walk 50 feet with 2 turns activity did not occur: Safety/medical concerns         Walk 150 feet activity   Assist Walk 150 feet activity did not occur: Safety/medical concerns         Walk 10 feet on uneven surface  activity   Assist Walk 10 feet on uneven surfaces activity did not occur: Safety/medical concerns         Wheelchair     Assist Is the patient using a wheelchair?: Yes Type of Wheelchair: Manual    Wheelchair assist level: Dependent - Patient 0%      Wheelchair 50 feet with 2 turns activity    Assist        Assist Level: Dependent - Patient 0%   Wheelchair 150 feet activity     Assist      Assist Level: Dependent - Patient 0%   Blood pressure (!) 136/44, pulse 68, temperature 98.3 F (36.8 C), resp. rate 16, height 5' 6.5 (1.689 m), weight 55 kg, SpO2 100%.  Medical Problem List and Plan: 1. Functional deficits secondary to  bilateral embolic shower, embolic pattern, etiology:  Afib and missed Eliquis  doses vs from NSTEMI with cardiac cath.              -patient may  shower             -ELOS/Goals: 12-15 days min A-              Cannot sit upright without propping         Con't CIR PT OT and SLP  - Ordered PRAFO/WHO right upper extremity, sling for right upper extremity also ordered  -Will plan for palliative consult for goals of care - notified daughter and pt, pt seen  yesterday, appreciate assistance  - Recheck CBC and BMP today due to reports of fatigue but overall appears around her baseline, Check CXR- later in afternoon pt reported to nursing/therapy she had cough in AM that resolved, per therapy patient reporting she feels she may have pneumonia - says she's had it before  and feels like it's the same , Will add U/A  -possibly mood related?, she had recent goals of care discussion   10-25: Chest x-ray unchanged, increasingly hyponatremic.  Increase IV fluids to 75 cc/h, urinalysis pending.  10-26: Lethargy improving, more engaged today. 11-22-26 lethargy remains improved(back to basline), consider SNF recommendation  -Plan for SNF disccharge    2.  Antithrombotics: -DVT/anticoagulation:  Pharmaceutical: Eliquis  Transitioned from IV Heparin  to Eliquis  10/14             -antiplatelet therapy: Asprin 81 mg  3. Pain Management: Gabapentin  and Tylenol  as needed  -Voltaren  R shoulder for R shoulder pain  - 10/17 patient now reporting some pain in her left shoulder, suspect from increased use with therapy.  Right shoulder doing better.  Will try Voltaren  gel to left shoulder, consider imaging if does not improve  10/18 added kpad for neck/shoulder as well  -10/22-23 Continue voltaren  gel for occasional shoulder pain  - Can try K-pad for neck as she has some soreness this morning  11-19-25 no complaints of pain  10/28, intermittent shoulder pain, had pain yesterday and had rapid response called yesterday to r/o cardiac event and had EKG/Trop, was found to have pain in trapezius muscle. Xray L shoulder, try lidocaine  patch  10/30 neck pain reported this AM, spoke with nursing early afternoon, pain improved with heatpack/repositioning and voltaren  gel  11/1 tramadol  prn for severe pain, C spine Xray, she just got tylenol  few min before I saw her  11/2 C spine xray with spondylosis, neck pain doing better today  11/3- Pt required tramadol  last night for  shoulder pain- was better this AM 4. Mood/Behavior/Sleep: LCSW to follow for evaluation and support when available.              -antipsychotic agents: N/A 5. Neuropsych/cognition: This patient is not  capable of making decisions on her own behalf. 6. Skin/Wound Care: routine pressure relief measures.  7. Fluids/Electrolytes/Nutrition: monitor intake and output. Follow up labs in a.m.              - SLP consult: Carb modified diet + Ensure  -nutrition consult for decreased p.o. intake  10/18 intake remains minimal. RD following   -added megace for appetitte  10/19 obsv today. Recheck labs, prealbumin in AM   10/20 Prealbumin 21 this AM, continue to monitor PO intake  10/22 Hx of pain with swallowing, appears has been chronic for several, nursing reports daughter said yesterday she was seen by GI in the past. Started PPI  10/23 called GI for consult- she has been seen by [facility] outpatient  10/24 Seen by GI appreciate- Barium swallow, Sucralfate   She ate 40-50% meals yesterday, poor intake but a little better  11-19-25: Eating 25 to 50% of meals  Seen by nutrition- for severe malnutrition, appreciate assistance  10/29 poor po intake continues, continue to encouarge  11/2 continue megace, encourage ensure. Hopefully treating thrush will help  11/3- not really awake this AM- an issue as well overnight 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 Continue aspirin  and statin.   9. CAD: 10/4-10/7 admitted for NSTEMI  10. HLD: Lipitor 40 mg  11. HTN: monitor blood pressures on  Hyzaar, Cardizem , and Aldactone      11/29/2023    4:36 AM 11/28/2023    7:36 PM 11/28/2023    2:24 PM  Vitals with BMI  Systolic 136 151 865  Diastolic 44 54 55  Pulse 68 80 76  -  11/2 DBP a little low but stable overall, continue diltiazem   12. Paroxymal Atrial Flutter/Fib: continue Eliquis  2.5 bid  13. T2DM: HgbA1c 6.0, resumed home Amaryl   - monitor cbg AC/HS with SSI.  CBG (last 3)  Recent Labs     11/28/23 1641 11/28/23 2132 11/29/23 0531  GLUCAP 101* 118* 98  - 10/15 Will order CBG/SSI  - CBGs well-controlled, holding amaryl . But not eating much  -11/1 CBGs well controlled 11/3- probably well controlled due to lack of eating- con't to encourage pt to eat more. 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  Hypothyroidism: Synthroid  17.  Chronic anemia: Stable, monitor CBC.   -10/17 Hgb improved to 10 yesterday  -10/18 lower HGB at 8.4 suspect dilutional due to IVF, recheck tomorrow  - 10-26: Hemoglobin from 10-8.2.  May be dilutional due to IV fluids, will get iron studies and FOBT to evaluate --FOBT positive, hesitant to hold [facility] given recent embolic strokes, will increase GI prophylaxis to 40 mg twice daily and defer AC discussion to primary team.  Trend hemoglobin. -10/27 stable overall at 8.2, appears to fluctuate, continue to monitor for now for reasons as above  -10/30 slightly lower at 7.9, will call GI Dr. Fayne- called left voicemail with my callback information -11/1 discussed with GI Dr. 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  Continue to monitor  -10/20 NA 131 today, continue to monitor   -10/23 NA stable 131  10-25: Sodium 128 yesterday, repeat today similar.  Serum osmole's within normal limits, increasing IV fluids from 50 to 75 cc/h overnight and repeat labs in AM.  Placed 1200 cc p.o. fluid restriction.  Urine  studies pending.  10-26: Sodium remains 128 despite IV fluids; urine/serum osmole's indicative of SIADH. ?  Due to lung pathology, DC trazodone , DC IV fluids, start salt tabs 1 g 2 times daily and repeat labs in AM.  10/29 NA stable 130, continue current   10/30 NA 129, overall stable. Appears hyponatremia has been chronic for several years.   -10/31, Na stable overall, appears has been low for many years, discuss with nephrology Dr. Melia, continue to monitor as long in stable range, will check TSH/cortisol next labs  Recheck BMP/check TSH/Cortisol Monday. Hyponatremia has been chronic for years, dont think she needs fluid restrictions  11/3- Na 130- is stable 21. Azotemia  -10/20 IVF NS 50ml/hr  -10/23 BUN and CR improved, continue IVF at night for poor oral intake, can hold if drinks 1L  10-25: Creatinine increased to 1.02; increasing IV fluids as above  10-26: Creatinine 0.94, DC IV fluids due to SIADH as above.  -10/27 Cr/BUN  trending up as IVF stopped, recheck tomorrow  -10/30 Bun 20, 0.93 Cr stable overall   Recheck tomorrow  11/3- BUN 18 and Cr 0.97- slightly better- con't to monitor 22.  Urinary incontinence/UTI  - 10-25 urinalysis mildly positive; start Keflex  500 mg twice daily for 7 days;  follow sensitivities- Ecoli sensitive to cefazolin - continue current regimen  23.  Right hemiparesis/spasticity  - 10-26: Due to cognitive issues and lethargy, hesitant to schedule muscle relaxers.  Start baclofen 5 mg 3 times daily as needed.  -Continue PRN as above to avoid sedation  -Consider outpatient botox  24. Thrush  -Start Nystatin   -11/2 improved 25. Sedation/hard to wake up  11/3- per staff, continues to be sleepy- hard to wake up- might benefit from Ritalin? Will d/w team   I spent a total of 37   minutes on total care today- >50% coordination of care- due to  D/w nursing and sent message to team- about sedation- also review of labs, vitals and B/B  Addendum- spoke with team-  will add Ritalin 5 mg BID with breakfast and lunch- 0630 and noon- and depending on results, will reduce to every day  LOS: 20 days A FACE TO FACE EVALUATION WAS PERFORMED  Lezley Bedgood 11/29/2023, 8:22 AM

## 2023-11-30 LAB — GLUCOSE, CAPILLARY
Glucose-Capillary: 120 mg/dL — ABNORMAL HIGH (ref 70–99)
Glucose-Capillary: 143 mg/dL — ABNORMAL HIGH (ref 70–99)
Glucose-Capillary: 167 mg/dL — ABNORMAL HIGH (ref 70–99)
Glucose-Capillary: 123 mg/dL — ABNORMAL HIGH (ref 70–99)

## 2023-11-30 NOTE — Progress Notes (Signed)
 Occupational Therapy Weekly Progress Note  Patient Details  Name: [name] MRN: [id number] Date of Birth: Sep 13, 1940  Beginning of progress report period: November 23, 2023 End of progress report period: December 01, 2023  Today's Date: 11/30/2023 OT Individual Time: 8667-8584 OT Individual Time Calculation (min): 43 min    Patient has met 4 of 4 short term goals.  Patient has demonstrated decrease in participation with fluctuation during ADLs. Patient able to complete static sitting on EOB with CGA to max A. Patient D?C have been updated to SNF vs home at bed level due to patients fluctuation and decrease in alertness. Patients goals will be updated accordingly. Medical team attempting varying medications to elicit increased participation in skilled therapy due to lethargic behavior.   Patient continues to demonstrate the following deficits: muscle weakness, decreased cardiorespiratoy endurance, impaired timing and sequencing, abnormal tone, unbalanced muscle activation, motor apraxia, ataxia, decreased coordination, and decreased motor planning, decreased initiation, decreased attention, decreased awareness, decreased problem solving, decreased safety awareness, decreased memory, and delayed processing, and decreased sitting balance, decreased standing balance, decreased postural control, hemiplegia, and decreased balance strategies and therefore will continue to benefit from skilled OT intervention to enhance overall performance with BADL and Reduce care partner burden.  Patient not progressing toward long term goals.  See goal revision..  Plan of care revisions: patient goals set at max A level due to patients fluctuation in levels, further downgraded from previous spot. .  OT Short Term Goals Week 1:  OT Short Term Goal 1 (Week 1): patient will complete UB dressing with max A OT Short Term Goal 1 - Progress (Week 1): Met OT Short Term Goal 2 (Week 1): patient will complete grooming with  mod A OT Short Term Goal 2 - Progress (Week 1): Met OT Short Term Goal 3 (Week 1): patient will complete functional toilet transfer to bsc max A OT Short Term Goal 3 - Progress (Week 1): Progressing toward goal OT Short Term Goal 4 (Week 1): patient will complete toileting on BSC or toilet with total A OT Short Term Goal 4 - Progress (Week 1): Progressing toward goal  Skilled Therapeutic Interventions/Progress Updates:   Patient agreeable to participate in OT session. Reports no pain level.   Patient participated in skilled OT session focusing on sitting balance, functional activity tolerance, dynamic sitting balance, UE ROM. Patient received tilted back in wc. OT adjusted patients tilt and leg position due to patient reported discomfort. Patient transported to therapy gym. Patient then able to complete transfer to mat table total A. Patient CG To max A for static sitting balance. Max A for static/ dynamic sitting balance legs unsupported . Patient then +2 for facilitation of LE movement with assist for postural trunk control in edge of mat sitting. Patient required max verbal cues and max A for alertness due to extreme fatigue and lethargy during session. Patient returned to room, alarm on in bed all needs in reach.   Therapy Documentation Precautions:  Precautions Precautions: Fall Recall of Precautions/Restrictions: Intact Precaution/Restrictions Comments: SBP <180, R hemiparesis Required Braces or Orthoses: Other Brace Other Brace: R LE PRAFO, R UE sling, R UE resting hand splint Restrictions Weight Bearing Restrictions Per Provider Order: No  Therapy/Group: Individual Therapy  D'mariea L Doryan Bahl 11/30/2023, 3:49 PM

## 2023-11-30 NOTE — Progress Notes (Signed)
 Patient ID: [name], female   DOB: [birth year], 83 y.o.   MRN: [id number]  Spoke with patients daughter with an update. Informed her that the discharge plan is for her mother to discharge to SNF placement. She informed SW that she was promised by [facility] that if they could print Priority on the form that services could be expedited - SW informed her that there are only 2 options available at present (SNF placement or home) however, [facility] services may not be readily available upon discharge.   SW encouraged [name] that she will need to make a decision since there were bed offers made. SW will provide her with a list of facilities that have extended a bed offer even though they were not a preference.   [name] understood and was made aware of her options.   SW will keep the team updated.

## 2023-11-30 NOTE — Progress Notes (Signed)
 Physical Therapy Session Note  Patient Details  Name: [name] MRN: [id number] Date of Birth: 22-Jul-1940  Today's Date: 11/30/2023 PT Individual Time: 0903-0959 PT Individual Time Calculation (min): 56 min   Short Term Goals: Week 3:  PT Short Term Goal 1 (Week 3): STG=LTG due to ELOS  Skilled Therapeutic Interventions/Progress Updates:      Pt in bed finishing up breakfast upon arrival - pt states she is unable to eat more than 2 bites due to not having dentures. Denies pain and agreeable to therapy.  Session emphasized endurance, activity tolerance, functional strengthening, and trunk control with transfers and seated balance. To don pants, pt able to lift L LE with verbal cueing and R LE with max A for PT to thread pants onto LEs. Pt instructed in and pushed through B LE (max A to block R LE) to perform bridge in order to pull pants up. Verbal cues required for patient participation using L UE - minimal initiation/participation. Pt performed supine to sit transfer to EOB with max A and verbal cueing for LEs (R>L) and to push through L UE to bring trunk to upright position. Seated EOB, pt instructed to maintain upright sitting balance with midline posture in order to drink shake. However, required mod/max A and heavy verbal cueing/encouragement to do so. Pt demonstrated tendency to lose balance posteriorly and toward R as trunk control extremely limited. Pt reported fatigue and struggled to keep her eyes open. Max verbal cueing for cervical extension to keep head upright while drinking through straw. Pt instructed in and performed squat pivot transfer to TIS toward weaker side with total A. Transported dependent in WC to main gym for time management. Pt transferred TIS <> EOM with total A. Worked on seated balance EOM as well with same levels of assist and cueing required as before. Pt continuing to require max-total cueing for awakeness/ alertness throughout the sessions. Pt remained in TIS,  seatbelt alarm set, and all needs within reach on L side at end of session.  Therapy Documentation Precautions:  Precautions Precautions: Fall Recall of Precautions/Restrictions: Intact Precaution/Restrictions Comments: SBP <180, R hemiparesis Required Braces or Orthoses: Other Brace Other Brace: R LE PRAFO, R UE sling, R UE resting hand splint Restrictions Weight Bearing Restrictions Per Provider Order: No  Therapy/Group: Individual Therapy  [name], DPT 11/30/2023, 5:11 PM

## 2023-11-30 NOTE — Progress Notes (Signed)
 Speech Language Pathology Daily Session Note  Patient Details  Name: [name] MRN: [id number] Date of Birth: 1940-12-05  Today's Date: 11/30/2023 SLP Individual Time: 0815-0900 SLP Individual Time Calculation (min): 45 min  Short Term Goals: Week 3: SLP Short Term Goal 1 (Week 3): STG = LTG due to ELOS  Skilled Therapeutic Interventions:   SLP conducted skilled therapy session targeting swallowing and cognition goals. Upon SLP entry, patient laying in bed. Patient appeared fatigued though agreeable to breakfast. Clinician provided totalA for repositioning patient, breakfast tray set up, and feeding. Patient consumed two bites each of sausage, eggs, and pancakes. Cough x2 and belch x1 observed after consuming thin liquid via cup and cough x1 while ingesting eggs. Note, no overt s/sx present after consuming thin liquid via straw. Despite repositioning patient and environmental modifications, patient remained lethargic requiring increased time for mastication though modI for swallow initiation. Additionally, patient modA for sustained attention as she required numerous verbal and tactile cues to remain awake. Patient supervisionA for ordering dinner and requested to continue eating post therapy session. Direct handoff to nursing student for remaining breakfast. Patient was left in room with call bell in reach and alarm set. SLP will continue to target goals per plan of care.    Pain   none  Therapy/Group: Individual Therapy  Ashley Ellin, M.A., CCC-SLP  Ashley A Ellin 11/30/2023, 9:05 AM

## 2023-11-30 NOTE — Progress Notes (Signed)
 Patient ID: [name], female   DOB: [birth year], 83 y.o.   MRN: [id number]  1537: [name] left a voicemail for an update on list.   1546: SW attempted to make contact twice with no success. No way to leave a voicemail.   Will follow up.

## 2023-12-01 DIAGNOSIS — J849 Interstitial pulmonary disease, unspecified: Secondary | ICD-10-CM

## 2023-12-01 DIAGNOSIS — C50512 Malignant neoplasm of lower-outer quadrant of left female breast: Secondary | ICD-10-CM

## 2023-12-01 DIAGNOSIS — Z170 Estrogen receptor positive status [ER+]: Secondary | ICD-10-CM

## 2023-12-01 DIAGNOSIS — I5032 Chronic diastolic (congestive) heart failure: Secondary | ICD-10-CM

## 2023-12-01 DIAGNOSIS — C3490 Malignant neoplasm of unspecified part of unspecified bronchus or lung: Secondary | ICD-10-CM

## 2023-12-01 LAB — CBC WITH DIFFERENTIAL/PLATELET
Abs Immature Granulocytes: 0.09 K/uL — ABNORMAL HIGH (ref 0.00–0.07)
Basophils Absolute: 0.1 K/uL (ref 0.0–0.1)
Basophils Relative: 1 %
Eosinophils Absolute: 0.1 K/uL (ref 0.0–0.5)
Eosinophils Relative: 2 %
HCT: 26.9 % — ABNORMAL LOW (ref 36.0–46.0)
Hemoglobin: 8.7 g/dL — ABNORMAL LOW (ref 12.0–15.0)
Immature Granulocytes: 1 %
Lymphocytes Relative: 16 %
Lymphs Abs: 1.3 K/uL (ref 0.7–4.0)
MCH: 29.1 pg (ref 26.0–34.0)
MCHC: 32.3 g/dL (ref 30.0–36.0)
MCV: 90 fL (ref 80.0–100.0)
Monocytes Absolute: 0.9 K/uL (ref 0.1–1.0)
Monocytes Relative: 11 %
Neutro Abs: 5.8 K/uL (ref 1.7–7.7)
Neutrophils Relative %: 69 %
Platelets: 100 K/uL — ABNORMAL LOW (ref 150–400)
RBC: 2.99 MIL/uL — ABNORMAL LOW (ref 3.87–5.11)
RDW: 18.4 % — ABNORMAL HIGH (ref 11.5–15.5)
WBC: 8.3 K/uL (ref 4.0–10.5)
nRBC: 0 % (ref 0.0–0.2)

## 2023-12-01 LAB — GLUCOSE, CAPILLARY
Glucose-Capillary: 95 mg/dL (ref 70–99)
Glucose-Capillary: 130 mg/dL — ABNORMAL HIGH (ref 70–99)
Glucose-Capillary: 104 mg/dL — ABNORMAL HIGH (ref 70–99)

## 2023-12-01 MED ORDER — OXYMETAZOLINE HCL 0.05 % NA SOLN
2.0000 | Freq: Two times a day (BID) | NASAL | Status: AC | PRN
  Administered 2023-12-01 – 2023-12-03 (×3): 2 via NASAL
  Filled 2023-12-01: qty 30

## 2023-12-01 NOTE — Progress Notes (Signed)
 Patient's nose bleed continued despite pressure and application of gauzes. NP and Charge Nurse notified. NP came in the room to assess patient and put in [facility]. Tramadol  and tylenol  PRN administered for pain. Patient's family arrived later and had questions about the nosebleed event. NP returned to answer family's questions and concerns.   At 1745 family asked for MRI or CT. NP contacted and stated no indication for such intervention. Nightshift MD on call updated.

## 2023-12-01 NOTE — Plan of Care (Signed)
 Downgraded goals due to slow progress and limited participation in therapy sessions.  Problem: RH Balance Goal: LTG Patient will maintain dynamic sitting balance (PT) Description: LTG:  Patient will maintain dynamic sitting balance with assistance during mobility activities (PT) Flowsheets (Taken 12/01/2023 0808) LTG: Pt will maintain dynamic sitting balance during mobility activities with:: Total Assistance - Patient < 25%   Problem: Sit to Stand Goal: LTG:  Patient will perform sit to stand with assistance level (PT) Description: LTG:  Patient will perform sit to stand with assistance level (PT) Flowsheets (Taken 12/01/2023 0808) LTG: PT will perform sit to stand in preparation for functional mobility with assistance level: Total Assistance - Patient < 25%   Problem: RH Bed to Chair Transfers Goal: LTG Patient will perform bed/chair transfers w/assist (PT) Description: LTG: Patient will perform bed to chair transfers with assistance (PT). Flowsheets (Taken 12/01/2023 0808) LTG: Pt will perform Bed to Chair Transfers with assistance level: Total Assistance - Patient < 25%

## 2023-12-01 NOTE — Plan of Care (Signed)
 Goals downgraded due to severity of impairments and progress made thus far:   Problem: RH Swallowing Goal: LTG Patient will consume least restrictive diet using compensatory strategies with assistance (SLP) Description: LTG:  Patient will consume least restrictive diet using compensatory strategies with assistance (SLP) Flowsheets (Taken 12/01/2023 1037) LTG: Pt Patient will consume least restrictive diet using compensatory strategies with assistance of (SLP): Moderate Assistance - Patient 50 - 74%   Problem: RH Expression Communication Goal: LTG Patient will increase speech intelligibility (SLP) Description: LTG: Patient will increase speech intelligibility at word/phrase/conversation level with cues, % of the time (SLP) Flowsheets Taken 12/01/2023 1037 by Bea Knee A, CCC-SLP LTG: Patient will increase speech intelligibility (SLP): Moderate Assistance - Patient 50 - 74% Level: Phrase Taken 11/10/2023 1216 by Sockwell, Cassidi F, CCC-SLP Percent of time patient will use intelligible speech: 80   Problem: RH Problem Solving Goal: LTG Patient will demonstrate problem solving for (SLP) Description: LTG:  Patient will demonstrate problem solving for basic/complex daily situations with cues  (SLP) Flowsheets Taken 12/01/2023 1037 by Bea Knee A, CCC-SLP LTG Patient will demonstrate problem solving for: Moderate Assistance - Patient 50 - 74% Taken 11/10/2023 1216 by Sockwell, Cassidi F, CCC-SLP LTG: Patient will demonstrate problem solving for (SLP): Basic daily situations   Problem: RH Memory Goal: LTG Patient will use memory compensatory aids to (SLP) Description: LTG:  Patient will use memory compensatory aids to recall biographical/new, daily complex information with cues (SLP) Flowsheets (Taken 12/01/2023 1037) LTG: Patient will use memory compensatory aids to (SLP): Moderate Assistance - Patient 50 - 74%

## 2023-12-01 NOTE — Progress Notes (Signed)
 Physical Therapy Weekly Progress Note  Patient Details  Name: [name] MRN: [id number] Date of Birth: [birth year]  Beginning of progress report period: November 24, 2023 End of progress report period: December 01, 2023  Pt's overall level of assist remains max-total A (+2 at times) for bed mobility, transfers, sitting balance, and standing within the past week. Decreased participation in therapy sessions noted within past week. Pt's overall progress toward goals remains limited by weakness, dense R hemiparesis, poor postural control, R UE/LE inattention, significant fatigue, limited alertness, and motivation, and therefore, LTGs have been downgraded further to reflect this. Medical team attempting varying medications to elicit increased participation in skilled therapy sessions due to lethargy and limited alertness. Pt awaiting SNF placement at this time.  Patient continues to demonstrate the following deficits muscle weakness and muscle joint tightness, decreased cardiorespiratoy endurance, impaired timing and sequencing, abnormal tone, unbalanced muscle activation, decreased coordination, and decreased motor planning, decreased midline orientation, decreased attention to right, right side neglect, and decreased motor planning, decreased initiation, decreased attention, decreased awareness, decreased problem solving, and delayed processing, and decreased sitting balance, decreased standing balance, decreased postural control, hemiplegia, and decreased balance strategies and therefore will continue to benefit from skilled PT intervention to increase functional independence with mobility.  Patient not progressing toward long term goals.  See goal revision..  Plan of care revisions: 12/01/23.  PT Short Term Goals Week 3:  PT Short Term Goal 1 (Week 3): STG=LTG due to ELOS Week 4:  PT Short Term Goal 1 (Week 4): STG=LTG due to ELOS  Skilled Therapeutic Interventions/Progress Updates:      Therapy  Documentation Precautions:  Precautions Precautions: Fall Recall of Precautions/Restrictions: Intact Precaution/Restrictions Comments: SBP <180, R hemiparesis Required Braces or Orthoses: Other Brace Other Brace: R LE PRAFO, R UE sling, R UE resting hand splint Restrictions Weight Bearing Restrictions Per Provider Order: No  Therapy/Group: Individual Therapy  [name], DPT 12/01/2023, 7:26 AM

## 2023-12-01 NOTE — Progress Notes (Signed)
 Nose bleed observed through right nares by one of the medical staff. Applied pressure for a few minutes but bleeding continued. Contacted NP on call. Gauze applied in right nare. Will continue to monitor.

## 2023-12-01 NOTE — Progress Notes (Signed)
 Occupational Therapy Session Note  Patient Details  Name: TALIN ROZEBOOM MRN: [id number] Date of Birth: Oct 03, 1940  Today's Date: 12/01/2023 OT Individual Time: 1001-1044 OT Individual Time Calculation (min): 43 min    Short Term Goals: Week 4:  OT Short Term Goal 1 (Week 4): STG =LTG d/t ELOS   Skilled Therapeutic Interventions/Progress Updates:  Patient agreeable to participate in OT session. Reports no verbal pain level, however patient had grimacing with movement. Patient sitting in wc when OT arrived. OT and patient began by completing face grooming. Patient initially able to complete CG however faded to max A due to fatigue. Patient nose began bleeding with slow constant pace. OT attempted to stop bleed however reported to nursing due to it continuing. Nursing charted and reported to MD. Patient then attempted 1x sit to stand with total x 2 for partial stand . Patient demonstrated increased lethargy today with max cues to maintain awake. Patient, OT, and PA discussed palliative care options. Patient then progressed through PROM with grimacing in abduction on B/L UE. Patient then returned to sitting and reclined with all needs in reach.    Therapy Documentation Precautions:  Precautions Precautions: Fall Recall of Precautions/Restrictions: Intact Precaution/Restrictions Comments: SBP <180, R hemiparesis Required Braces or Orthoses: Other Brace Other Brace: R LE PRAFO, R UE sling, R UE resting hand splint Restrictions Weight Bearing Restrictions Per Provider Order: No  Therapy/Group: Individual Therapy  D'mariea L Konnor Jorden 12/01/2023, 7:19 AM

## 2023-12-01 NOTE — Progress Notes (Signed)
 Daily Progress Note   Patient Name: [name]       Date: 12/01/2023 DOB: June 27, 1940  Age: 83 y.o. MRN#: [id number] Attending Physician: Urbano Albright, MD Primary Care Physician: [name], MD Admit Date: 11/09/2023  Reason for Consultation/Follow-up: Establishing goals of care  Subjective: Medical records reviewed including progress notes, labs and imaging.  Patient assessed at the bedside.  She is sitting in wheelchair with assistance from OT, her nose is dripping blood.  No present or my visit.  Created space and opportunity for patient's thoughts and feelings on her current illness.  Attempted to explore her care preferences, inquiring if this last week of recovery/rehabilitation has altered any of her wishes, priorities, or feelings about her quality of life.  She seems a little confused by my questions and when I attempted to clarify, she did not notify me of any changes to her goals of care.  She did nod to indicate her understanding that she always has the right to continue pursuing rehabilitation and improvement versus shifting to comfort focused care and hospice philosophy.  Called daughter [name] to provide update on the above interaction and explore her thoughts and feelings as well.  She tells me that over the past few days, patient has to been too tired and has not talked to her much about anything either.  She is hoping that a less intensive therapy plan will give patient the opportunity to balance her rest and exercises at SNF.  I shared my concern that patient may still not improve at SNF given her lethargy and what seems to be depression.  She is concerned about this as well.  She also notes that she just found out minutes ago that there is mold in her rental home and feel like patient would definitely would need to go  to SNF while this is addressed.  I encouraged continued goals of care conversations with patient and family as well as outpatient palliative care referral.  Daughter was agreeable.  She understands the plan and options moving forward based on patient's trajectory.  Questions and concerns addressed. PMT will continue to support holistically.   Length of Stay: 22   Physical Exam Vitals and nursing note reviewed.  Constitutional:      General: She is not in acute distress. HENT:     Head: Normocephalic and atraumatic.  Cardiovascular:     Rate and Rhythm: Normal rate.  Pulmonary:     Effort: Pulmonary effort is normal.  Skin:    General: Skin is warm and dry.  Neurological:     Mental Status: She is alert.  Motor: Weakness present.  Psychiatric:        Mood and Affect: Mood is depressed.        Behavior: Behavior is slowed.            Vital Signs: BP (!) 145/48 (BP Location: Right Arm)   Pulse 78   Temp 97.6 F (36.4 C)   Resp 19   Ht 5' 6.5 (1.689 m)   Wt 55 kg   SpO2 100%   BMI 19.28 kg/m  SpO2: SpO2: 100 % O2 Device: O2 Device: Room Air O2 Flow Rate:     Palliative Care Assessment & Plan   Patient Profile: 83 y.o. female  with past medical history of A.fib on Eliquis , recent dx of adenocarcinoma of lung undergoing radiation treatment, hx breast cancer s/p mastectomy, CAD, SLE with interstitial lung disease, hypertension, DM2, hypothyroidism, recent admission for NSTEMI with right heart cath (10/4-10/7)  admitted on 11/09/2023 with right sided weakness, slurred speech, altered mental status.    Initial CT imaging of the head indicated a large hypodensity on the right side, suggestive of a large territory infarct. 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  Patient ultimately went to [facility] for rehabilitation for her functional deficits.   PMT has been consulted to assist with goals of care conversation.  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 She feels she would have great difficulty if she required long-term care placement Revisited option of hospice if quality of life declines further TOC consulted for outpatient palliative care referral Psychosocial and emotional support provided PMT will continue to follow incrementally    Prognosis:  High risk for further decline and decompensation  Discharge Planning: Skilled Nursing Facility for rehab with Palliative care service follow-up  Care plan was discussed with Patient, patient's daughter, LCSW   Bindi Klomp SHAUNNA Fell, PA-C  Palliative Medicine Team Team phone # [phone]  Thank you for allowing the Palliative Medicine Team to assist in the care of this patient. Please utilize secure chat with additional questions, if there is no response within 30 minutes please call the above phone number.  Palliative Medicine Team providers are available by phone from 7am to 7pm daily and can be reached through the team cell phone.  Should this patient require assistance outside of these hours, please call the patient's attending physician.    Time Total: 35  Visit consisted of counseling and education dealing with the complex and  emotionally intense issues of symptom management and palliative care in the setting of serious and potentially life-threatening illness. Greater than 50% of this time was spent counseling and coordinating care related to the above assessment and plan.  Personally spent 35 minutes in patient care including extensive chart review (labs, imaging, progress/consult notes, vital signs), medically appropraite exam, discussed with treatment team, education to patient, family, and staff, documenting clinical information, medication review and management, coordination  of care, and available advanced directive documents.

## 2023-12-01 NOTE — Progress Notes (Signed)
 Occupational Therapist participated in the interdisciplinary team conference, providing clinical information regarding the patient's current status, treatment goals, and weekly focus, including any barriers that need to be addressed. Please see the Inpatient Rehabilitation Team Conference and Plan of Care Update for further details.   [name] OTR/L

## 2023-12-01 NOTE — Progress Notes (Signed)
 Speech Language Pathology Daily Session Note  Patient Details  Name: [name] MRN: [id number] Date of Birth: [birth year]  Today's Date: 12/01/2023 SLP Individual Time: 1400-1417 SLP Individual Time Calculation (min): 17 min SLP Missed Time: 43 min SLP Missed Time Reason: Nosebleed, pain  Short Term Goals: Week 3: SLP Short Term Goal 1 (Week 3): STG = LTG due to ELOS  Skilled Therapeutic Interventions: SLP conducted skilled therapy session targeting communication goals. Upon SLP entry, patient upright in chair with significant nosebleed, though per chart and per appearance, nosebleed addressed by nursing and NP prior to session initiation. Patient laments that she would like to eat but cannot due to ongoing bleeding. Patient often groaning/moaning throughout session and speech is largely unintelligible, requiring max cues to achieve conveyance of wants/needs at the phrase level. SLP attempted to facilitate cognitive tasks planned for the session, however patient nonparticipatory due to pain and fatigue. SLP provided max cuing to engage patient in activities however patient eventually endorsed she did not want to further attempt therapy tasks. Missed 43 minutes skilled therapy time. Patient was left in room with call bell in reach and alarm set. SLP will continue to target goals per plan of care.       Pain Pain Assessment Pain Scale: Faces Faces Pain Scale: Hurts whole lot  Therapy/Group: Individual Therapy  Ellis Koffler, M.A., CCC-SLP  Deyona Soza A Jaquis Picklesimer 12/01/2023, 2:31 PM

## 2023-12-01 NOTE — Patient Care Conference (Signed)
 Inpatient RehabilitationTeam Conference and Plan of Care Update Date: 12/01/2023   Time: 09:35 AM    Patient Name: [name]      Medical Record Number: [id number]  Date of Birth: April 10, 1940 Sex: Female         Room/Bed: 4M09C/4M09C-01 Payor Info: Payor: [employer]ICARE / Plan: DREMA DUAL COMPLETE / Product Type: *No Product type* /    Admit Date/Time:  11/09/2023  2:30 PM  Primary Diagnosis:  Acute right hemiparesis [facility])  Hospital Problems: Principal Problem:   Acute right hemiparesis (HCC) Active Problems:   Hypothyroidism   Primary hypertension   UTI (urinary tract infection)   ILD (interstitial lung disease) (HCC)   Hyponatremia   AKI (acute kidney injury)   Paroxysmal atrial flutter (HCC)   Diabetes mellitus type 2, noninsulin dependent (HCC)   Chronic diastolic CHF (congestive heart failure) (HCC)   Long term (current) use of anticoagulants   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 Prentice Compton Social Worker Present: [name] [name], LCSW-A Nurse Present: Barnie Ronde, RN PT Present: Izetta Seip, PT OT Present: Michaelyn Seip, OT SLP Present: Rosina Downy, SLP     Current Status/Progress Goal Weekly Team Focus  Bowel/Bladder      Incontinent of bowel and bladder   Incontinence managed with min assist    Toileting protocol   Swallow/Nutrition/ Hydration   Dys2/thin liquids - downgraded due to significantly prolonged mastication d/t lethargy in recent consecutive sessions, hope diet  downgrade will promote increased intake   supervision  ongoing diet tolerance and reupgrade when able    ADL's   total, sometimes max   downgraded to max - QD?   balance, postural control, participation, alertness, sitting tolerance, OOB tolerance, waiting on SNF selection    Mobility   Max (+2 at times) to total A for all mobility and transfers; dependent WC   downgraded to total A  DC to SNF; Pt's overall progress toward goals limited by weakness, dense R hemiparesis, poor postural control, R UE/LE inattention, significant fatigue, and motivation. Barriers- activity tolerance/fatige/lethargy, motivation    Communication   moderate dysarthria - imapcted by lack of dentition   minA   increased use of speech intelligibility strategies    Safety/Cognition/ Behavioral Observations  moderate cognitive deficits in problem solving, STM, attention - much more lethargic this reporting period, often max assist for sustained attention   minA   sustained attention, problem solving, use of memory log    Pain      Pain          Skin                 Discharge Planning:  Plans to make a decision of SNF bed offers. CAP-DA paperwork completed. Palliative involved. No DME or HH/OP follow-up due to placement.   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 Chronic shoulder pain with new musculoskeletal radiation. RRT called 11/23/23,  EKG completed. Functional progress limited by limited participation, poor balance, spasticity and lethargy.  Patient on target to meet rehab goals: no, currently limited participation with therapy. Needs max - total assist for mobility and max - dependent for ADLs and sitting edge of the bed. Continue to work on moderate cognitive  deficits and dysarthria.  *See Care Plan and progress notes for long and short-term goals.   Revisions to Treatment Plan:   Ritalin trial Downgraded goals Palliative Care consult Changed to q day therapy schedule   Teaching Needs: Safety, medications, skin care, dietary modification for Dysphagia 2 diet  Current Barriers to Discharge: Home enviroment access/layout, Incontinence, Lack of/limited family support, and Behavior  Possible Resolutions to Barriers: SNF recommended     Medical Summary Current Status: difficulty with wakefulness, multiple mebolic strokes;m shoulder and neck pain; using Kpad; hard ot participate in therapies, on [facility]  Barriers to Discharge: Behavior/Mood;Inadequate Nutritional Intake;Incontinence;Self-care education;Uncontrolled Pain  Barriers to Discharge Comments: pain in neck and shoulders; difficulty with staying awake; poor insight and memory; depression?; Possible Resolutions to [name] Focus: started ritalin 5 mg BID 11/4 to help with arousal; kapd and tramadol  for pain- might need to increase- d/c plan- SNF   Continued Need for Acute Rehabilitation Level of Care: The patient requires daily medical management by a physician with specialized training in physical medicine and rehabilitation for the following reasons: Direction of a multidisciplinary physical rehabilitation program to maximize functional independence : Yes Medical management of patient stability for increased activity during participation in an intensive rehabilitation regime.: Yes Analysis of laboratory values and/or radiology reports with any subsequent need for medication adjustment and/or medical intervention. : Yes   I attest that I was present, lead the team conference, and concur with the assessment and plan of the team.   [name] 12/01/2023, 1:29 PM

## 2023-12-01 NOTE — Progress Notes (Signed)
 Physical Therapy Session Note  Patient Details  Name: [name] MRN: [id number] Date of Birth: 04-12-1940  Today's Date: 12/01/2023 PT Individual Time: 0800-0857 PT Individual Time Calculation (min): 57 min   Short Term Goals: Week 3:  PT Short Term Goal 1 (Week 3): STG=LTG due to ELOS  Skilled Therapeutic Interventions/Progress Updates:     Pt supine upon arrival. Pt denies pain and agreeable to therapy. Pt very lethargic and struggled to keep her eyes open throughout entire session despite max-total verbal cueing/encouragement.   Session emphasized endurance, activity tolerance, functional strengthening, and trunk control with transfers and seated balance. Pt incontinent of bladder. Pt required total A for rolling in order for PT to change brief. PT placed washcloth in pt's L hand and instructed the pt to perform/assist with peri-hygiene, however, no initiation by patient despite verbal cueing. To don pants, pt lifted L LE with min A and R LE with max A for PT to thread pants onto LEs. Pt instructed in and pushed through B LE (max A to block R LE) to perform bridge in order to pull pants up. Pt performed supine to sit transfer to EOB with total A (+2 for safety). Seated EOB, pt struggled to maintain balance for upright position. Pt demonstrated tendency to lose balance posteriorly and toward R and L as trunk control extremely limited. Pt instructed to use her L UE to help maintain balance as well as to push through L UE to return to upright position when losing her balance toward L or R side - minimal effort noted. Mod-total A required to maintain upright seated balance. While seated EOB, attempted to have patient reach for fork with L UE in order to eat breakfast - no initiation noted. Pt stated she was not hungry. Pt instructed in and performed squat pivot transfer to TIS toward weaker side with total A. Pt remained in TIS, seatbelt alarm set, and all needs within reach on L side at end of  session.  Therapy Documentation Precautions:  Precautions Precautions: Fall Recall of Precautions/Restrictions: Intact Precaution/Restrictions Comments: SBP <180, R hemiparesis Required Braces or Orthoses: Other Brace Other Brace: R LE PRAFO, R UE sling, R UE resting hand splint Restrictions Weight Bearing Restrictions Per Provider Order: No  Therapy/Group: Individual Therapy  [name], DPT 12/01/2023, 7:22 AM

## 2023-12-01 NOTE — Plan of Care (Signed)
 DC WC goals due to safety (limited alertness) and dense R hemiplegia. Anticipate TIS WC at DC.  Problem: RH Wheelchair Mobility Goal: LTG Patient will propel w/c in controlled environment (PT) Description: LTG: Patient will propel wheelchair in controlled environment, # of feet with assist (PT) Outcome: Not Applicable Goal: LTG Patient will propel w/c in home environment (PT) Description: LTG: Patient will propel wheelchair in home environment, # of feet with assistance (PT). Outcome: Not Applicable

## 2023-12-01 NOTE — Progress Notes (Signed)
                                                         PROGRESS NOTE  Patient with epistasis from right nare. Afrin attempted without resolve. Rhino rocket size 4.5 instilled into right nare, bleeding controlled. Neurology consulted and advised to hold Eliquis  for a few days and resume when bleeding stops. Bleeding remained controlled but I was called back to the room around 1715 for complaints of pain and patient crying. Patients daughter at bedside, education provided. Prn pain medication being administered, ice pack to be placed for discomfort.     A FACE TO FACE EVALUATION WAS PERFORMED  [name] 12/01/2023, 5:22 PM

## 2023-12-01 NOTE — Progress Notes (Addendum)
 Patient ID: [name], female   DOB: [birth year], 83 y.o.   MRN: [id number]  Attempted to make contact with daughter, [name], for an update no answer and voicemail box full.   Text message was sent with team conference update. I also explained that the CAP-DA documentation she asked to be completed was done. She was also provided with the list of SNF acceptances she requested.   Awaiting correspondence.  UPDATE:  1213: Spoke with [name] and was able to provided an update from conference - she is aware of the SNF acceptances and plans on reviewing them and providing a choice so that we can notify the facility and begin auth for transfer.   She is agreeable with discharge plan

## 2023-12-01 NOTE — Progress Notes (Signed)
 1                                                        PROGRESS NOTE    Subjective/Complaints: Pt using Kpad for shoulder pain this AM- says has some pain.   Slept OK- Denies any issues by shaking head no-   More sleepy than yesterday when seen.     ROS: limited due to behavioral/cognitive + Painful swallowing - improved + L shoulder pain-worse overnight + neck pain-worse overnight  Objective:   No results found.    Recent Labs    11/29/23 0516  WBC 6.3  HGB 8.5*  HCT 25.6*  PLT 113*    Recent Labs    11/29/23 0516  NA 130*  K 4.3  CL 100  CO2 20*  GLUCOSE 95  BUN 18  CREATININE 0.97  CALCIUM  9.3      Intake/Output Summary (Last 24 hours) at 12/01/2023 9177 Last data filed at 11/30/2023 1800 Gross per 24 hour  Intake 218 ml  Output --  Net 218 ml        Physical Exam: Vital Signs Blood pressure (!) 145/48, pulse 78, temperature 97.6 F (36.4 C), resp. rate 19, height 5' 6.5 (1.689 m), weight 55 kg, SpO2 100%.     General: sleepy- awake, but kept closing eyes- has kpad on shoulders/neck; , NAD HENT: conjugate gaze; oropharynx moist CV: regular rate and rhythm; no JVD Pulmonary: slightly coarse, no actual Wheezing GI: soft, NT, ND, (+)BS- hypoactive Psychiatric: flat; sparse words Neurological: said a few words, but less awake than yesterday- still more awake than Monday Skin: Clean and intact without signs of breakdown  Neuro: Awake and alert,  R inattention, delayed responses  Decreased sensation light touch right lower extremity.  Continued RUE and RLE weakness  Tone: MAS 2 right shoulder, MAS 3 right elbow, MAS 0 wrists, fingers, MAS 2 knee flexors  Prior neuro assessment is c/w today's exam 12/01/2023.   Musculoskeletal: Mild tenderness Mid left C spine paraspinal muscles, head forward posture, minimal pain L shoulder rom, minimal L trapezius tenderness     Assessment/Plan: 1. Functional deficits which require 3+ hours  per day of interdisciplinary therapy in a comprehensive inpatient rehab setting. Physiatrist is providing close team supervision and 24 hour management of active medical problems listed below. Physiatrist and rehab team continue to assess barriers to discharge/monitor patient progress toward functional and medical goals  Care Tool:  Bathing    Body parts bathed by patient: Right arm, Left arm, Chest, Abdomen, Front perineal area   Body parts bathed by helper: Abdomen, Chest, Front perineal area, Buttocks, Right upper leg, Left upper leg, Right lower leg, Left lower leg, Face     Bathing assist Assist Level: Total Assistance - Patient < 25%     Upper Body Dressing/Undressing Upper body dressing   What is the patient wearing?: Hospital gown only, Pull over shirt    Upper body assist Assist Level: Total Assistance - Patient < 25%    Lower Body Dressing/Undressing Lower body dressing      What is the patient wearing?: Underwear/pull up     Lower body assist Assist for lower body dressing: Total Assistance - Patient < 25%     Toileting Toileting    Toileting assist Assist for toileting: Dependent -  Patient 0%     Transfers Chair/bed transfer  Transfers assist  Chair/bed transfer activity did not occur: Safety/medical concerns (fatigue)  Chair/bed transfer assist level: Total Assistance - Patient < 25%     Locomotion Ambulation   Ambulation assist   Ambulation activity did not occur: Safety/medical concerns (weakness/fatigue/pain)          Walk 10 feet activity   Assist  Walk 10 feet activity did not occur: Safety/medical concerns        Walk 50 feet activity   Assist Walk 50 feet with 2 turns activity did not occur: Safety/medical concerns         Walk 150 feet activity   Assist Walk 150 feet activity did not occur: Safety/medical concerns         Walk 10 feet on uneven surface  activity   Assist Walk 10 feet on uneven surfaces  activity did not occur: Safety/medical concerns         Wheelchair     Assist Is the patient using a wheelchair?: Yes Type of Wheelchair: Manual    Wheelchair assist level: Dependent - Patient 0%      Wheelchair 50 feet with 2 turns activity    Assist        Assist Level: Dependent - Patient 0%   Wheelchair 150 feet activity     Assist      Assist Level: Dependent - Patient 0%   Blood pressure (!) 145/48, pulse 78, temperature 97.6 F (36.4 C), resp. rate 19, height 5' 6.5 (1.689 m), weight 55 kg, SpO2 100%.  Medical Problem List and Plan: 1. Functional deficits secondary to  bilateral embolic shower, embolic pattern, etiology:  Afib and missed Eliquis  doses vs from NSTEMI with cardiac cath.              -patient may  shower             -ELOS/Goals: 12-15 days min A-              Cannot sit upright without propping         Con't CIR PT, OT and SLP  - Ordered PRAFO/WHO right upper extremity, sling for right upper extremity also ordered  -Will plan for palliative consult for goals of care - notified daughter and pt, pt seen yesterday, appreciate assistance  - Recheck CBC and BMP today due to reports of fatigue but overall appears around her baseline, Check CXR- later in afternoon pt reported to nursing/therapy she had cough in AM that resolved, per therapy patient reporting she feels she may have pneumonia - says she's had it before and feels like it's the same , Will add U/A  -possibly mood related?, she had recent goals of care discussion   10-25: Chest x-ray unchanged, increasingly hyponatremic.  Increase IV fluids to 75 cc/h, urinalysis pending.  10-26: Lethargy improving, more engaged today. 11-22-26 lethargy remains improved(back to basline), consider SNF recommendation  -Plan for SNF disccharge Added Ritalin 5 mg BID breakfast and lunch on Tuesday AM- slightly more awake.    2.  Antithrombotics: -DVT/anticoagulation:  Pharmaceutical: Eliquis   Transitioned from IV Heparin  to Eliquis  10/14             -antiplatelet therapy: Asprin 81 mg  3. Pain Management: Gabapentin  and Tylenol  as needed  -Voltaren  R shoulder for R shoulder pain  - 10/17 patient now reporting some pain in her left shoulder, suspect from increased use with therapy.  Right shoulder doing  better.  Will try Voltaren  gel to left shoulder, consider imaging if does not improve  10/18 added kpad for neck/shoulder as well  -10/22-23 Continue voltaren  gel for occasional shoulder pain  - Can try K-pad for neck as she has some soreness this morning  11-19-25 no complaints of pain  10/28, intermittent shoulder pain, had pain yesterday and had rapid response called yesterday to r/o cardiac event and had EKG/Trop, was found to have pain in trapezius muscle. Xray L shoulder, try lidocaine  patch  10/30 neck pain reported this AM, spoke with nursing early afternoon, pain improved with heatpack/repositioning and voltaren  gel  11/1 tramadol  prn for severe pain, C spine Xray, she just got tylenol  few min before I saw her  11/2 C spine xray with spondylosis, neck pain doing better today  11/3- Pt required tramadol  last night for shoulder pain- was better this AM  11/5- mainly using kpad a lot- had tramadol  last night at 10pm 4. Mood/Behavior/Sleep: LCSW to follow for evaluation and support when available.              -antipsychotic agents: N/A 5. Neuropsych/cognition: This patient is not  capable of making decisions on her own behalf. 6. Skin/Wound Care: routine pressure relief measures.  7. Fluids/Electrolytes/Nutrition: monitor intake and output. Follow up labs in a.m.              - SLP consult: Carb modified diet + Ensure  -nutrition consult for decreased p.o. intake  10/18 intake remains minimal. RD following   -added megace for appetitte  10/19 obsv today. Recheck labs, prealbumin in AM   10/20 Prealbumin 21 this AM, continue to monitor PO intake  10/22 Hx of pain with  swallowing, appears has been chronic for several, nursing reports daughter said yesterday she was seen by GI in the past. Started PPI  10/23 called GI for consult- she has been seen by [facility] outpatient  10/24 Seen by GI appreciate- Barium swallow, Sucralfate   She ate 40-50% meals yesterday, poor intake but a little better  11-19-25: Eating 25 to 50% of meals  Seen by nutrition- for severe malnutrition, appreciate assistance  10/29 poor po intake continues, continue to encouarge  11/2 continue megace, encourage ensure. Hopefully treating thrush will help  11/3- not really awake this AM- an issue as well overnight 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 Continue aspirin  and statin.   9. CAD: 10/4-10/7 admitted for NSTEMI  10. HLD: Lipitor 40 mg  11. HTN: monitor blood pressures on  Hyzaar, Cardizem , and Aldactone      12/01/2023    6:19 AM 11/30/2023    8:44 PM 11/30/2023   12:55 PM  Vitals with BMI  Systolic 145 156 864  Diastolic 48 58 45  Pulse 78 84 85  -11/2 DBP a little low but stable overall, continue diltiazem   11/5- BP a little high Systolic and low Diastolic- will con't to monitor 12. Paroxymal Atrial Flutter/Fib: continue Eliquis  2.5 bid  11/5- Regular rhythm this AM 13. T2DM: HgbA1c 6.0, resumed home Amaryl   - monitor cbg AC/HS with SSI.  CBG (last 3)  Recent Labs    11/30/23 1607 11/30/23 2127 12/01/23 0615  GLUCAP 167* 123* 95  - 10/15 Will order CBG/SSI  - CBGs well-controlled, holding amaryl . But not eating much  -11/1 CBGs well controlled 11/3--11/5- probably well controlled due to lack of eating- con't to encourage pt to eat more. 14 Right Breast CA: Post mastectomy, on anastrozole   15.  Stage IIIa Lung Adenocarcinoma: s/p radiation -follow up with Oncology  16.  Hypothyroidism: Synthroid  17.  Chronic anemia: Stable, monitor CBC.   -10/17 Hgb improved to 10 yesterday  -10/18 lower HGB at 8.4 suspect dilutional due to IVF, recheck tomorrow  - 10-26:  Hemoglobin from 10-8.2.  May be dilutional due to IV fluids, will get iron studies and FOBT to evaluate --FOBT positive, hesitant to hold [facility] given recent embolic strokes, will increase GI prophylaxis to 40 mg twice daily and defer AC discussion to primary team.  Trend hemoglobin. -10/27 stable overall at 8.2, appears to fluctuate, continue to monitor for now for reasons as above  -10/30 slightly lower at 7.9, will call GI Dr. Fayne- called left voicemail with my callback information -11/1 discussed with GI Dr. 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  Continue to monitor  -10/20 NA 131 today, continue to monitor   -10/23 NA stable 131  10-25: Sodium 128 yesterday, repeat today similar.  Serum osmole's within normal limits, increasing IV fluids from 50 to 75 cc/h overnight and repeat labs in AM.  Placed 1200 cc p.o. fluid restriction.  Urine studies pending.  10-26: Sodium remains 128 despite IV fluids; urine/serum osmole's indicative of SIADH. ?  Due to lung pathology, DC trazodone , DC IV fluids, start salt tabs 1 g 2 times daily and repeat labs in AM.  10/29 NA stable 130, continue current   10/30 NA 129, overall stable. Appears hyponatremia has been chronic for several years.   -10/31, Na stable overall, appears has been low for many years, discuss with nephrology Dr. Melia, continue to monitor as long in stable range, will check TSH/cortisol next labs  Recheck BMP/check TSH/Cortisol Monday. Hyponatremia has been chronic for years, dont  think she needs fluid restrictions  11/3- Na 130- is stable 21. Azotemia  -10/20 IVF NS 50ml/hr  -10/23 BUN and CR improved, continue IVF at night for poor oral intake, can hold if drinks 1L  10-25: Creatinine increased to 1.02; increasing IV fluids as above  10-26: Creatinine 0.94, DC IV fluids due to SIADH as above.  -10/27 Cr/BUN  trending up as IVF stopped, recheck tomorrow  -10/30 Bun 20, 0.93 Cr stable overall   Recheck tomorrow  11/3- BUN 18 and Cr 0.97- slightly better- con't to monitor 22.  Urinary incontinence/UTI  - 10-25 urinalysis mildly positive; start Keflex  500 mg twice daily for 7 days; follow sensitivities- Ecoli sensitive to cefazolin - continue current regimen  23.  Right hemiparesis/spasticity  - 10-26: Due to cognitive issues and lethargy, hesitant to schedule muscle relaxers.  Start baclofen 5 mg 3 times daily as needed.  -Continue PRN as above to avoid sedation  -Consider outpatient botox  24. Thrush  -Start Nystatin   -11/2 improved 25. Sedation/hard to wake up  11/3- per staff, continues to be sleepy- hard to wake up- might benefit from Ritalin? Will d/w team   LOS: 22 days A FACE TO FACE EVALUATION WAS PERFORMED  Hilda Rynders 12/01/2023, 8:22 AM

## 2023-12-01 NOTE — Progress Notes (Signed)
 Physical Therapist participated in the interdisciplinary team conference, providing clinical information regarding the patient's current status, treatment goals, and weekly focus, including any barriers that need to be addressed. Please see the Inpatient Rehabilitation Team Conference and Plan of Care Update for further details.     Comer [place]ines, DPT

## 2023-12-02 ENCOUNTER — Inpatient Hospital Stay (HOSPITAL_COMMUNITY)

## 2023-12-02 DIAGNOSIS — D508 Other iron deficiency anemias: Secondary | ICD-10-CM

## 2023-12-02 DIAGNOSIS — N179 Acute kidney failure, unspecified: Secondary | ICD-10-CM

## 2023-12-02 LAB — CBC
HCT: 23.7 % — ABNORMAL LOW (ref 36.0–46.0)
Hemoglobin: 8 g/dL — ABNORMAL LOW (ref 12.0–15.0)
MCH: 29.7 pg (ref 26.0–34.0)
MCHC: 33.8 g/dL (ref 30.0–36.0)
MCV: 88.1 fL (ref 80.0–100.0)
Platelets: 97 K/uL — ABNORMAL LOW (ref 150–400)
RBC: 2.69 MIL/uL — ABNORMAL LOW (ref 3.87–5.11)
RDW: 18.3 % — ABNORMAL HIGH (ref 11.5–15.5)
WBC: 6.7 K/uL (ref 4.0–10.5)
nRBC: 0 % (ref 0.0–0.2)

## 2023-12-02 LAB — BASIC METABOLIC PANEL WITH GFR
Anion gap: 9 (ref 5–15)
BUN: 23 mg/dL (ref 8–23)
CO2: 19 mmol/L — ABNORMAL LOW (ref 22–32)
Calcium: 9.2 mg/dL (ref 8.9–10.3)
Chloride: 102 mmol/L (ref 98–111)
Creatinine, Ser: 0.89 mg/dL (ref 0.44–1.00)
GFR, Estimated: >60 mL/min (ref >60)
Glucose, Bld: 104 mg/dL — ABNORMAL HIGH (ref 70–99)
Potassium: 4.2 mmol/L (ref 3.5–5.1)
Sodium: 130 mmol/L — ABNORMAL LOW (ref 135–145)

## 2023-12-02 LAB — GLUCOSE, CAPILLARY
Glucose-Capillary: 113 mg/dL — ABNORMAL HIGH (ref 70–99)
Glucose-Capillary: 128 mg/dL — ABNORMAL HIGH (ref 70–99)
Glucose-Capillary: 137 mg/dL — ABNORMAL HIGH (ref 70–99)
Glucose-Capillary: 187 mg/dL — ABNORMAL HIGH (ref 70–99)

## 2023-12-02 MED ORDER — SODIUM CHLORIDE 0.9 % IV SOLN: INTRAVENOUS | Status: AC

## 2023-12-02 MED ORDER — ESCITALOPRAM OXALATE 10 MG PO TABS
10.0000 mg | ORAL_TABLET | Freq: Every day | ORAL | Status: DC
  Administered 2023-12-03 – 2023-12-08 (×5): 10 mg via ORAL
  Filled 2023-12-02 (×6): qty 1

## 2023-12-02 MED ORDER — GABAPENTIN 100 MG PO CAPS
100.0000 mg | ORAL_CAPSULE | Freq: Three times a day (TID) | ORAL | Status: DC
  Administered 2023-12-02 – 2023-12-04 (×4): 100 mg via ORAL
  Filled 2023-12-02 (×5): qty 1

## 2023-12-02 MED ORDER — METHYLPHENIDATE HCL 5 MG PO TABS
10.0000 mg | ORAL_TABLET | Freq: Two times a day (BID) | ORAL | Status: DC
  Administered 2023-12-02 – 2023-12-09 (×12): 10 mg via ORAL
  Filled 2023-12-02 (×13): qty 2

## 2023-12-02 NOTE — Progress Notes (Signed)
 Physical Therapy Note  Patient Details  Name: TATUMN CORBRIDGE MRN: [id number] Date of Birth: 12-26-1940 Today's Date: 12/02/2023    Pt's plan of care adjusted to every after speaking with care team and discussed with MD in team conference as pt currently unable to tolerate current therapy schedule with OT, PT, and SLP.    [name] Pines 12/02/2023, 4:32 PM

## 2023-12-02 NOTE — Progress Notes (Signed)
 Physical Therapy Session Note  Patient Details  Name: DESTYNI HOPPEL MRN: [id number] Date of Birth: 1941/01/10  Today's Date: 12/02/2023 PT Missed Time: 30 Minutes Missed Time Reason: Other (Comment) (Out of room for CT scan)  Short Term Goals: Week 4:  PT Short Term Goal 1 (Week 4): STG=LTG due to ELOS  Skilled Therapeutic Interventions/Progress Updates:     Pt missed 30 min due to being out of room for CT scan.  Therapy Documentation Precautions:  Precautions Precautions: Fall Recall of Precautions/Restrictions: Intact Precaution/Restrictions Comments: SBP <180, R hemiparesis Required Braces or Orthoses: Other Brace Other Brace: R LE PRAFO, R UE sling, R UE resting hand splint Restrictions Weight Bearing Restrictions Per Provider Order: No  Therapy/Group: Individual Therapy  [name], DPT 12/02/2023, 7:22 AM

## 2023-12-02 NOTE — Progress Notes (Addendum)
 1                                                        PROGRESS NOTE    Subjective/Complaints: Pt remains lethargic. Nose bleed last night requiring nasal rocket. Daughter present and has noticed a general decline this past week. Won't engage much with family either.  Palliative care saw pt with recs documented.     ROS: Limited due to cognitive/behavioral   Objective:   No results found.    Recent Labs    12/01/23 1600 12/02/23 0507  WBC 8.3 6.7  HGB 8.7* 8.0*  HCT 26.9* 23.7*  PLT 100* 97*    Recent Labs    12/02/23 0507  NA 130*  K 4.2  CL 102  CO2 19*  GLUCOSE 104*  BUN 23  CREATININE 0.89  CALCIUM  9.2      Intake/Output Summary (Last 24 hours) at 12/02/2023 0939 Last data filed at 12/01/2023 1230 Gross per 24 hour  Intake 150 ml  Output --  Net 150 ml        Physical Exam: Vital Signs Blood pressure (!) 142/46, pulse 86, temperature 99.6 F (37.6 C), temperature source Oral, resp. rate 15, height 5' 6.5 (1.689 m), weight 55 kg, SpO2 100%.     Constitutional: lethargic . Vital signs reviewed. HEENT: NCAT, EOMI, oral membranes moist, rocket in right nare Neck: supple Cardiovascular: RRR without murmur. No JVD    Respiratory/Chest: CTA Bilaterally without wheezes or rales. Normal effort    GI/Abdomen: BS +, non-tender, non-distended Ext: no clubbing, cyanosis, or edema Psych: pleasant and cooperative  Neurological: non-verbal. Selectively opens eyes and makes contac.  Skin: Clean and intact without signs of breakdown  Neuro: Awake and alert,  R inattention, delayed responses  Decreased sensation light touch right lower extremity.  Continued RUE and RLE weakness. Does not initiate movement on right  Tone: MAS 2 right shoulder, MAS 3 right elbow, MAS 0 wrists, fingers, MAS 2 knee flexors Musculoskeletal: Mild tenderness Mid left C spine paraspinal muscles, head forward posture, minimal pain L shoulder rom, minimal L trapezius tenderness      Assessment/Plan: 1. Functional deficits which require 3+ hours per day of interdisciplinary therapy in a comprehensive inpatient rehab setting. Physiatrist is providing close team supervision and 24 hour management of active medical problems listed below. Physiatrist and rehab team continue to assess barriers to discharge/monitor patient progress toward functional and medical goals  Care Tool:  Bathing    Body parts bathed by patient: Right arm, Left arm, Chest, Abdomen, Front perineal area   Body parts bathed by helper: Abdomen, Chest, Front perineal area, Buttocks, Right upper leg, Left upper leg, Right lower leg, Left lower leg, Face     Bathing assist Assist Level: Total Assistance - Patient < 25%     Upper Body Dressing/Undressing Upper body dressing   What is the patient wearing?: Hospital gown only, Pull over shirt    Upper body assist Assist Level: Total Assistance - Patient < 25%    Lower Body Dressing/Undressing Lower body dressing      What is the patient wearing?: Underwear/pull up     Lower body assist Assist for lower body dressing: Total Assistance - Patient < 25%     Toileting Toileting    Toileting assist Assist  for toileting: Dependent - Patient 0%     Transfers Chair/bed transfer  Transfers assist  Chair/bed transfer activity did not occur: Safety/medical concerns (fatigue)  Chair/bed transfer assist level: Total Assistance - Patient < 25%     Locomotion Ambulation   Ambulation assist   Ambulation activity did not occur: Safety/medical concerns (weakness/fatigue/pain)          Walk 10 feet activity   Assist  Walk 10 feet activity did not occur: Safety/medical concerns        Walk 50 feet activity   Assist Walk 50 feet with 2 turns activity did not occur: Safety/medical concerns         Walk 150 feet activity   Assist Walk 150 feet activity did not occur: Safety/medical concerns         Walk 10 feet on  uneven surface  activity   Assist Walk 10 feet on uneven surfaces activity did not occur: Safety/medical concerns         Wheelchair     Assist Is the patient using a wheelchair?: Yes Type of Wheelchair: Manual    Wheelchair assist level: Dependent - Patient 0%      Wheelchair 50 feet with 2 turns activity    Assist        Assist Level: Dependent - Patient 0%   Wheelchair 150 feet activity     Assist      Assist Level: Dependent - Patient 0%   Blood pressure (!) 142/46, pulse 86, temperature 99.6 F (37.6 C), temperature source Oral, resp. rate 15, height 5' 6.5 (1.689 m), weight 55 kg, SpO2 100%.  Medical Problem List and Plan: 1. Functional deficits secondary to  bilateral embolic shower, embolic pattern, etiology:  Afib and missed Eliquis  doses vs from NSTEMI with cardiac cath.              -patient may  shower             -ELOS/Goals: SNF pending?-              Cannot sit upright without propping         Con't CIR PT, OT and SLP  - Ordered PRAFO/WHO right upper extremity, sling for right upper extremity also ordered  11/6-appreciate palliative care consult. Spoke with daughter about presentation which could be mood driven. Daughter concerned about futher neurological decline. will check head CT today. Consider neuro re-assessment?      2.  Antithrombotics: -DVT/anticoagulation:  Pharmaceutical: Eliquis  Transitioned from IV Heparin  to Eliquis  10/14             -antiplatelet therapy: Asprin 81 mg  3. Pain Management: Gabapentin  and Tylenol  as needed  -Voltaren  R shoulder for R shoulder pain  - 10/17 patient now reporting some pain in her left shoulder, suspect from increased use with therapy.  Right shoulder doing better.  Will try Voltaren  gel to left shoulder, consider imaging if does not improve  10/18 added kpad for neck/shoulder as well  -10/22-23 Continue voltaren  gel for occasional shoulder pain  - Can try K-pad for neck as she has some  soreness this morning  11-19-25 no complaints of pain  10/28, intermittent shoulder pain, had pain yesterday and had rapid response called yesterday to r/o cardiac event and had EKG/Trop, was found to have pain in trapezius muscle. Xray L shoulder, try lidocaine  patch  10/30 neck pain reported this AM, spoke with nursing early afternoon, pain improved with heatpack/repositioning and voltaren  gel  11/1 tramadol  prn for severe pain, C spine Xray, she just got tylenol  few min before I saw her  11/2 C spine xray with spondylosis, neck pain doing better today  11/3- Pt required tramadol  last night for shoulder pain- was better this AM  11/5- mainly using kpad a lot- had tramadol  last night at 10pm 4. Mood/Behavior/Sleep: LCSW to follow for evaluation and support when available.              -antipsychotic agents: N/A  -11/6 pt on lexapro for mood--change to hs  -increase ritalin to 10mg  to see if it helps arousal/mood 5. Neuropsych/cognition: This patient is not  capable of making decisions on her own behalf. 6. Skin/Wound Care: routine pressure relief measures.  7. Fluids/Electrolytes/Nutrition: monitor intake and output. Follow up labs in a.m.              - SLP consult: Carb modified diet + Ensure  -nutrition consult for decreased p.o. intake  10/18 intake remains minimal. RD following   -added megace for appetitte  10/19 obsv today. Recheck labs, prealbumin in AM   10/20 Prealbumin 21 this AM, continue to monitor PO intake  10/22 Hx of pain with swallowing, appears has been chronic for several, nursing reports daughter said yesterday she was seen by GI in the past. 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 Continue aspirin  and statin.   9. CAD: 10/4-10/7 admitted for NSTEMI  10. HLD: Lipitor 40 mg  11. HTN: monitor blood pressures on  Hyzaar, Cardizem , and Aldactone      12/02/2023    5:23 AM 12/01/2023    8:24 PM 12/01/2023    2:44 PM  Vitals with BMI  Systolic 142 158 853  Diastolic 46 50 48  Pulse 86  81  -11/2 DBP a little low but stable overall, continue diltiazem   11/5- BP a little high Systolic and low Diastolic- will con't to monitor 12. Paroxymal Atrial Flutter/Fib: continue Eliquis  2.5 bid  11/5- Regular rhythm this AM 13. T2DM: HgbA1c 6.0, resumed home Amaryl   - monitor cbg AC/HS with SSI.  CBG (last 3)  Recent Labs    12/01/23 1132 12/01/23 2053 12/02/23 0530  GLUCAP 130* 104* 113*  - 10/15 Will order CBG/SSI  - CBGs well-controlled, holding amaryl . But not eating much  -11/1 CBGs well controlled 11/7 well controlled due to lack of eating- con't to encourage pt to eat more. 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  Hypothyroidism: Synthroid  17.  Chronic anemia: Stable, monitor CBC.   -10/17 Hgb improved to 10 yesterday  -10/18 lower HGB at 8.4 suspect dilutional due to IVF, recheck tomorrow  - 10-26: Hemoglobin from 10-8.2.  May be dilutional due to IV fluids, will get iron studies and FOBT to evaluate --FOBT positive, hesitant to hold [facility] given recent embolic strokes, will increase GI prophylaxis to 40 mg twice daily and defer AC discussion to primary team.  Trend hemoglobin. -10/27 stable overall at 8.2,  appears to fluctuate, continue to monitor for now for reasons as above  -10/30 slightly lower at 7.9, will call GI Dr. Fayne- called left voicemail with my callback information -11/1 discussed with GI Dr. 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  Continue to monitor  -10/20 NA 131 today, continue to monitor   -10/23 NA stable 131  10-25: Sodium 128 yesterday, repeat today similar.  Serum osmole's within normal limits, increasing IV fluids from 50 to 75 cc/h overnight and repeat labs in AM.  Placed 1200 cc p.o. fluid restriction.  Urine studies pending.  10-26: Sodium remains 128 despite IV fluids; urine/serum osmole's indicative of SIADH. ?  Due to lung pathology, DC trazodone , DC IV fluids, start salt tabs 1 g 2 times daily and repeat labs in AM.  10/29 NA stable 130, continue current   10/30 NA 129, overall stable. Appears hyponatremia has been chronic for several years.   -10/31, Na stable overall, appears has been low for many years, discuss with nephrology Dr. Melia, continue to monitor as long in stable range, will check TSH/cortisol next labs  Recheck BMP/check TSH/Cortisol Monday. Hyponatremia has been chronic for years, dont think she needs fluid restrictions  11/6- Na 130- is stable 21. Azotemia  -10/20 IVF NS 50ml/hr  -10/23 BUN and CR improved, continue IVF at night for poor oral intake, can hold if drinks 1L  10-25: Creatinine increased to 1.02; increasing IV fluids as above  10-26: Creatinine 0.94, DC IV fluids due to SIADH as above.  -10/27 Cr/BUN  trending up as IVF stopped, recheck tomorrow  -10/30 Bun 20, 0.93 Cr stable overall   Recheck tomorrow  11/6==BUN up to 23, eating nothing--add back IVF 22.  Urinary incontinence/UTI  - 10-25 urinalysis mildly  positive; start Keflex  500 mg twice daily for 7 days; follow sensitivities- Ecoli sensitive to cefazolin - continue current regimen  23.  Right hemiparesis/spasticity  - 10-26: Due to cognitive issues and lethargy, hesitant to schedule muscle relaxers.  Start baclofen 5 mg 3 times daily as needed.  -Continue PRN as above to avoid sedation  -Consider outpatient botox  24. Epistaxis  -a/c held per NP note.  -continue rhino rocket for now     LOS: 23 days A FACE TO FACE EVALUATION WAS PERFORMED  Arthea ONEIDA Gunther 12/02/2023, 9:39 AM

## 2023-12-02 NOTE — Plan of Care (Signed)
  Problem: Consults Goal: RH STROKE PATIENT EDUCATION Description: See Patient Education module for education specifics  Outcome: Progressing   Problem: RH SAFETY Goal: RH STG ADHERE TO SAFETY PRECAUTIONS W/ASSISTANCE/DEVICE Description: STG Adhere to Safety Precautions With cues Assistance/Device. Outcome: Progressing   Problem: RH KNOWLEDGE DEFICIT Goal: RH STG INCREASE KNOWLEDGE OF DIABETES Description: Patient and dtr will be able to manage DM using educational resources for medications and dietary modification independently Outcome: Progressing Goal: RH STG INCREASE KNOWLEDGE OF HYPERTENSION Description: Patient and dtr will be able to manage HTN using educational resources for medications and dietary modification independently Outcome: Progressing Goal: RH STG INCREASE KNOWLEGDE OF HYPERLIPIDEMIA Description: Patient and dtr will be able to manage HLD using educational resources for medications and dietary modification independently Outcome: Progressing Goal: RH STG 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 Description: Patient and dtr will be able to manage secondary risks using educational resources for medications and dietary modification independently Outcome: Progressing   Problem: Education: Goal: Ability to describe self-care measures that may prevent or decrease complications (Diabetes Survival Skills Education) will improve Outcome: Progressing Goal: Individualized Educational Video(s) Outcome: Progressing   Problem: Coping: Goal: Ability to adjust to condition or change in health will improve Outcome: Progressing   Problem: Fluid Volume: Goal: Ability to maintain a balanced intake and output will improve Outcome: Progressing   Problem: Health Behavior/Discharge Planning: Goal: Ability to identify and utilize available resources and services will improve Outcome: Progressing Goal: Ability to manage health-related needs will improve Outcome: Progressing    Problem: Metabolic: Goal: Ability to maintain appropriate glucose levels will improve Outcome: Progressing   Problem: Nutritional: Goal: Maintenance of adequate nutrition will improve Outcome: Progressing Goal: Progress toward achieving an optimal weight will improve Outcome: Progressing   Problem: Skin Integrity: Goal: Risk for impaired skin integrity will decrease Outcome: Progressing   Problem: Tissue Perfusion: Goal: Adequacy of tissue perfusion will improve Outcome: Progressing

## 2023-12-02 NOTE — Progress Notes (Signed)
 Occupational Therapy Session Note  Patient Details  Name: [name] MRN: [id number] Date of Birth: 09-25-1940  Today's Date: 12/02/2023 OT Individual Time: 1035-1100 OT Individual Time Calculation (min): 25 min    Short Term Goals: Week 4:  OT Short Term Goal 1 (Week 4): STG =LTG d/t ELOS  Skilled Therapeutic Interventions/Progress Updates:     Pt received deeply sleeping in bed upon OT arrival, difficult to arouse and barely opening eyes throughout session. Pt moaning out in pain, however unable to verbalize location of pain or provide numerical value- OT offering intermittent rest breaks, repositioning, and therapeutic support to optimize participation in therapy session. Pt's DTR present in room with questions regarding Pt's current functional status and anticipated outcomes. Provided gentle education on Pt's status and DTR in agreement that Pt has been declining over the past week. Educated on options of services including hospice vs palliative and option of changing therapy schedule to every day as Pt has been too lethargic to participate. Pt's DTR receptive to education- also informed RN and PA planning to speak with DTR as well. Provided Pt with warm wash cloth and attempted to engage her in face washing, however she was too lethargic to complete task. Completed gentle PROM to B U/LEs to prevent onset of tone and maintain ROM with Pt able to tolerate with MIN increase in pain noted. Spent time at end of session positioning Pt in bed to support R hemibody, increase comfort, and prevent skin breakdown. Pt was left resting in bed with call bell in reach, bed alarm on, and all needs met.    Therapy Documentation Precautions:  Precautions Precautions: Fall Recall of Precautions/Restrictions: Intact Precaution/Restrictions Comments: SBP <180, R hemiparesis Required Braces or Orthoses: Other Brace Other Brace: R LE PRAFO, R UE sling, R UE resting hand splint Restrictions Weight Bearing  Restrictions Per Provider Order: No  Therapy/Group: Individual Therapy  [name] 12/02/2023, 12:44 PM

## 2023-12-02 NOTE — Progress Notes (Signed)
 Daily Progress Note   Patient Name: [name]       Date: 12/02/2023 DOB: 1940/09/06  Age: 83 y.o. MRN#: [id number] Attending Physician: Urbano Albright, MD Primary Care Physician: [name], MD Admit Date: 11/09/2023  Reason for Consultation/Follow-up: Establishing goals of care  Subjective: Medical records reviewed including progress notes, labs and imaging.  Patient assessed at the bedside.  She is very lethargic, will open her eyes to my very loud voice but then falls back asleep quickly.  Daughter was attempting to feed her lunch and we needed to call for assistance to clean out her mouth given that she was too lethargic to swallow.  Created space and opportunity for family's thoughts and feelings on patient's current illness. [name] shared that she is greatly concerned that there is something that has changed in patient's condition, sharing that patient's church members visited yesterday and felt the same concern.  Confirmed that CT was pending at her request.    I reviewed previous goals of care discussions, such as patient's wishes not to have life-prolonging measures if she was bedbound or nonverbal.  I shared my hope that patient is found to have something treatable/reversible and that I would pray for this.  I also shared my concern that this is part of patient's acute/chronic disease burden and trajectory of decline may not be reversible.  We reviewed the different paths forward including treating the treatable versus consideration of hospice sooner rather than later.  I fear that she is likely heading towards needing hospice.  [name] understands this.  She would be appreciative of palliative follow-up tomorrow after additional testing/results are ready, for continued goals of care conversations based on findings.  Questions  and concerns addressed. PMT will continue to support holistically.   Length of Stay: 23   Physical Exam Vitals and nursing note reviewed.  Constitutional:      General: She is not in acute distress. HENT:     Head: Normocephalic and atraumatic.  Cardiovascular:     Rate and Rhythm: Normal rate.  Pulmonary:     Effort: Pulmonary effort is normal.  Skin:    General: Skin is warm and dry.  Neurological:     Mental Status: She is lethargic.     Motor: Weakness present.  Psychiatric:        Mood and Affect: Mood is depressed.        Behavior: Behavior is slowed.            Vital Signs: BP (!) 142/46 (BP Location: Right Arm)   Pulse 86   Temp 99.6 F (37.6 C) (  Oral)   Resp 15   Ht 5' 6.5 (1.689 m)   Wt 55 kg   SpO2 100%   BMI 19.28 kg/m  SpO2: SpO2: 100 % O2 Device: O2 Device: Room Air O2 Flow Rate:     Palliative Care Assessment & Plan   Patient Profile: 83 y.o. female  with past medical history of A.fib on Eliquis , recent dx of adenocarcinoma of lung undergoing radiation treatment, hx breast cancer s/p mastectomy, CAD, SLE with interstitial lung disease, hypertension, DM2, hypothyroidism, recent admission for NSTEMI with right heart cath (10/4-10/7)  admitted on 11/09/2023 with right sided weakness, slurred speech, altered mental status.    Initial CT imaging of the head indicated a large hypodensity on the right side, suggestive of a large territory infarct. 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 There were also bilateral embolic watershed infarcts and bilateral cerebellum infarcts identified.  Patient ultimately went to CIR for rehabilitation for her functional deficits.   PMT has been consulted to assist with goals of care conversation.  Assessment: Goals of  care conversation Functional deficits secondary to bilateral embolic shower, embolic pattern Recent NSTEMI Right Breast CA diagnosed 12/2022 s/p mastectomy Stage IIIa Lung Adenocarcinoma diagnosed 07/2023 s/p radiation SLE  Recommendations/Plan: CODE STATUS changed to DNR/DNI.  Gold form signed and placed in patient's hard chart Continue current care plan, workup to determine if cause of patient's lethargy is is treatable/reversible.  Patient's daughter would desire rehospitalization if so Per previous discussions, she would not find it acceptable to be bedbound or nonverbal. She feels she would have great difficulty if she required long-term care placement Ongoing goals of care discussions Psychosocial and emotional support provided PMT will follow-up with patient's daughter tomorrow   Prognosis:  High risk for further decline and decompensation  Discharge Planning: Skilled Nursing Facility for rehab with Palliative care service follow-up  Care plan was discussed with Patient, patient's daughter, NT, MD  Mickle SHAUNNA Fell, PA-C  Palliative Medicine Team Team phone # [phone]  Thank you for allowing the Palliative Medicine Team to assist in the care of this patient. Please utilize secure chat with additional questions, if there is no response within 30 minutes please call the above phone number.  Palliative Medicine Team providers are available by phone from 7am to 7pm daily and can be reached through the team cell phone.  Should this patient require assistance outside of these hours, please call the patient's attending physician.   MDM: High

## 2023-12-02 NOTE — Progress Notes (Signed)
 Speech Language Pathology Weekly Progress and Session Note  Patient Details  Name: [name] MRN: [id number] Date of Birth: 1940-12-14  Beginning of progress report period: November 22, 2023 End of progress report period: December 02, 2023  Today's Date: 12/02/2023 SLP Individual Time: 0830-0900 SLP Individual Time Calculation (min): 30 min  Short Term Goals: Week 3: SLP Short Term Goal 1 (Week 3): STG = LTG due to ELOS SLP Short Term Goal 1 - Progress (Week 3): Progressing toward goal    New Short Term Goals: Week 4: SLP Short Term Goal 1 (Week 4): Patient will attend to functional therapy tasks for 1 min given mod assist. SLP Short Term Goal 2 (Week 4): Patient will solve basic functional problems with 20% accuracy given mod assist. SLP Short Term Goal 3 (Week 4): Patient will tolerate Dys2/thin liquid diet given mod assist for use of safe swallowing strategies. SLP Short Term Goal 4 (Week 4): Patient will utiize speech intelligibility strategies at the word level to achieve 50% intelligibility given mod assist.  Weekly Progress Updates: Patient has made limited progress towards therapy goals and goals have been downgraded to reflect current status and halted improvement. Patient currently benefits from total assist to attend to functional therapy tasks for 30 second intervals. Due to this limited engagement and extreme fatigue, she has been unable to complete and functional nor structured problem solving tasks. Patient's diet was downgraded to Dys2 this reporting period d/t fatigue in hopes of improving intake and reducing energy expended during meals. She requires max assist to utilize speech intelligibility strategies at the word level.   Intensity: Minumum of 1-2 x/day, 30 to 90 minutes Frequency: 3 to 5 out of 7 days Duration/Length of Stay: SNF pending Treatment/Interventions: Cognitive remediation/compensation;Dysphagia/aspiration precaution training;Internal/external  aids;Speech/Language facilitation;Therapeutic Activities;Functional tasks;Multimodal communication approach;Patient/family education  Daily Session  Skilled Therapeutic Interventions: SLP conducted skilled therapy session targeting cognitive-communication goals. Upon SLP entry, patient in bed and exhibited groaning and grimacing. Clinician asked patient to identify pain level and location however no response given. Patient breakfast tray present however she declined food items. SLP attempted to facilitate a mildly complex problem solving activity though initially patient declined. Originally, patient stated go away however through encouragement patient eventually agreeable. SLP pivoted to target speech intelligibility and long term memory through functional questions pertaining to pts life. Patient ~50% intelligible at word and phrase level requiring mod to max cuing to overarticulate and increase vocal loudness. Patient supervisionA for long term memory recall however d/t pain level, patient frequently did not answer clinican questions. Direct handoff to physician and patients daughter. Patient was left in room with call bell in reach and alarm set. SLP will continue to target goals per plan of care.        Pain  None endorsed  Therapy/Group: Individual Therapy  Ashley Ellin, M.A., CCC-SLP  Ashley A Ellin 12/02/2023, 9:13 AM

## 2023-12-03 DIAGNOSIS — R040 Epistaxis: Secondary | ICD-10-CM

## 2023-12-03 DIAGNOSIS — Z7901 Long term (current) use of anticoagulants: Secondary | ICD-10-CM

## 2023-12-03 LAB — BASIC METABOLIC PANEL WITH GFR
Anion gap: 8 (ref 5–15)
BUN: 23 mg/dL (ref 8–23)
CO2: 20 mmol/L — ABNORMAL LOW (ref 22–32)
Calcium: 9.1 mg/dL (ref 8.9–10.3)
Chloride: 103 mmol/L (ref 98–111)
Creatinine, Ser: 0.93 mg/dL (ref 0.44–1.00)
GFR, Estimated: >60 mL/min (ref >60)
Glucose, Bld: 120 mg/dL — ABNORMAL HIGH (ref 70–99)
Potassium: 4.2 mmol/L (ref 3.5–5.1)
Sodium: 131 mmol/L — ABNORMAL LOW (ref 135–145)

## 2023-12-03 LAB — CBC
HCT: 22.6 % — ABNORMAL LOW (ref 36.0–46.0)
Hemoglobin: 7.5 g/dL — ABNORMAL LOW (ref 12.0–15.0)
MCH: 29.5 pg (ref 26.0–34.0)
MCHC: 33.2 g/dL (ref 30.0–36.0)
MCV: 89 fL (ref 80.0–100.0)
Platelets: 82 K/uL — ABNORMAL LOW (ref 150–400)
RBC: 2.54 MIL/uL — ABNORMAL LOW (ref 3.87–5.11)
RDW: 18.5 % — ABNORMAL HIGH (ref 11.5–15.5)
WBC: 6.8 K/uL (ref 4.0–10.5)
nRBC: 0 % (ref 0.0–0.2)

## 2023-12-03 LAB — GLUCOSE, CAPILLARY
Glucose-Capillary: 128 mg/dL — ABNORMAL HIGH (ref 70–99)
Glucose-Capillary: 120 mg/dL — ABNORMAL HIGH (ref 70–99)
Glucose-Capillary: 101 mg/dL — ABNORMAL HIGH (ref 70–99)
Glucose-Capillary: 102 mg/dL — ABNORMAL HIGH (ref 70–99)

## 2023-12-03 LAB — PREALBUMIN: Prealbumin: 23 mg/dL (ref 18–38)

## 2023-12-03 NOTE — Progress Notes (Signed)
 Attempted to administered morning medication, crushed in apple sauce. Pt was able to accept one bite of mixture, but refused to swallow. Fluids encouraged but pt did not want to swallow. Therapy at bedside, provided oral care and suctioned pt's mouth. No other oral medication administered at this time as pt continues to refuse.

## 2023-12-03 NOTE — Progress Notes (Signed)
 Occupational Therapy Session Note  Patient Details  Name: RONALEE SCHEUNEMANN MRN: [id number] Date of Birth: 11-Sep-1940  Today's Date: 12/03/2023 OT Individual Time: 1020-1045 OT Individual Time Calculation (min): 25 min    Short Term Goals: Week 4:  OT Short Term Goal 1 (Week 4): STG =LTG d/t ELOS  Skilled Therapeutic Interventions/Progress Updates:     Pt received deeply sleeping in bed, difficult to wake. Pt's nose noted to still be bleeding- RN informed and present for duration of session. Pt nodding no when OT inquired about pain. Noted to have blood in mouth from nose bleed. Assisted Pt with oral care completed total A d/t decreased alertness. Washed Pt's face with cool wash cloth and Pt did demonstrate improved arousal and able to maintain eyes open when OT consistently completed sternal rub and called out Pt's name. Collaborated with RN in assisting with Pt's arousal to allow for her to take morning medications. She tolerated taking one bite of apple sauce with medications with one small swallow, however she would not take additional swallow despite max encouragement so OT assisted RN with suctioning mouth to prevent Pt from choking. Pt also noted to have increased respiratory speed (~19 per min)- RN assessed vitals and SPO2 was still 100%. Assisted Pt to comfortable position in bed. Pt was left resting in bed with call bell in reach, bed alarm on, and all needs met.   Therapy Documentation Precautions:  Precautions Precautions: Fall Recall of Precautions/Restrictions: Intact Precaution/Restrictions Comments: SBP <180, R hemiparesis Required Braces or Orthoses: Other Brace Other Brace: R LE PRAFO, R UE sling, R UE resting hand splint Restrictions Weight Bearing Restrictions Per Provider Order: No   Therapy/Group: Individual Therapy  [name] 12/03/2023, 12:44 PM

## 2023-12-03 NOTE — Progress Notes (Addendum)
 Patient ID: [name], female   DOB: December 27, 1940, 83 y.o.   MRN: [id number]  Attempted to make contact with patient's daughter, [name], to get an update on SNF choice. There was no answer and no way to leave a voicemail due to voicemail box being full.   Will attempt to contact again.   UPDATE:  Visited [name] in patient's room and discussed the options as her mother has experienced a great decline. SNF may no longer be an option due to patient not being rehabable.   Palliative care entered the room and took over to discuss palliative and comfort care options.   Will await update.

## 2023-12-03 NOTE — Progress Notes (Signed)
 Physical Therapy Session Note  Patient Details  Name: [name] MRN: [id number] Date of Birth: Oct 31, 1940  Today's Date: 12/03/2023 PT Missed Time: 30 Minutes Missed Time Reason: Other (Comment) (Fatigue/lethargic/unable to keep eyes open)  Short Term Goals: Week 4:  PT Short Term Goal 1 (Week 4): STG=LTG due to ELOS  Skilled Therapeutic Interventions/Progress Updates:     Pt asleep in bed upon arrival with family present. Pt very lethargic and unable to keep her eyes open while PT attempted to talk to her. Therefore, pt missed 30 min treatment time.  Therapy Documentation Precautions:  Precautions Precautions: Fall Recall of Precautions/Restrictions: Intact Precaution/Restrictions Comments: SBP <180, R hemiparesis Required Braces or Orthoses: Other Brace Other Brace: R LE PRAFO, R UE sling, R UE resting hand splint Restrictions Weight Bearing Restrictions Per Provider Order: No  Therapy/Group: Individual Therapy  [name], DPT 12/03/2023, 7:26 AM

## 2023-12-03 NOTE — Consult Note (Signed)
 Reason forconsult: Epistaxis Referring Physician: Dr.Shtridelman  [name] is an 83 y.o. female.  HPI: History of epistaxis for about a week.  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  She has had very intermittent short bleeding for years.  Never seen a ENT.  No nasal obstruction.  Past Medical History:  Diagnosis Date   Acute bronchitis 12/17/2021   Acute cystitis 02/20/2019   Arthritis    Atrial flutter (HCC)    Breast cancer (HCC)    left  2009 and 2025   Diabetes mellitus without complication (HCC)    Dyspnea    mostly with exertion   GERD (gastroesophageal reflux disease)    History of radiation therapy    Left Lung-08/09/23-10/01/23- Dr. 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[facility])    Lupus    Pneumonia 07/31/2021    Past Surgical History:  Procedure Laterality Date   BREAST BIOPSY Left 01/15/2023   US  LT BREAST BX W LOC DEV 1ST LESION IMG BX SPEC US  GUIDE 01/15/2023 [facility]MO[facility]   BREAST LUMPECTOMY Left 2009   BRONCHIAL BIOPSY  06/29/2023   Procedure: BRONCHOSCOPY, WITH BIOPSY;  Surgeon: [name], MD;  Location: [facility] ENDOSCOPY;  Service: Pulmonary;;   BRONCHIAL BRUSHINGS  06/29/2023   Procedure: BRONCHOSCOPY, WITH BRUSH BIOPSY;  Surgeon: [name], MD;  Location: [facility] ENDOSCOPY;  Service: Pulmonary;;   BRONCHIAL NEEDLE ASPIRATION BIOPSY  06/29/2023   Procedure: BRONCHOSCOPY, WITH NEEDLE ASPIRATION BIOPSY;  Surgeon: [name], MD;  Location: [facility] ENDOSCOPY;  Service: Pulmonary;;   IR 3D INDEPENDENT WKST  05/29/2021   IR ANGIO INTRA EXTRACRAN SEL COM CAROTID INNOMINATE BILAT MOD SED  05/29/2021   IR ANGIO VERTEBRAL SEL VERTEBRAL BILAT MOD SED  05/29/2021   LEFT HEART CATH AND CORONARY ANGIOGRAPHY N/A 11/01/2023   Procedure: LEFT HEART CATH AND CORONARY ANGIOGRAPHY;  Surgeon: Wonda Sharper, MD;  Location: [facility] INVASIVE CV LAB;  Service:  Cardiovascular;  Laterality: N/A;   SIMPLE MASTECTOMY WITH AXILLARY SENTINEL NODE BIOPSY Left 03/31/2023   Procedure: LEFT MASTECTOMY;  Surgeon: Vernetta Berg, MD;  Location: [facility] OR;  Service: General;  Laterality: Left;  LMA PEC BLOCK   VIDEO BRONCHOSCOPY WITH ENDOBRONCHIAL NAVIGATION Left 06/29/2023   Procedure: VIDEO BRONCHOSCOPY WITH ENDOBRONCHIAL NAVIGATION;  Surgeon: [name], MD;  Location: [facility] ENDOSCOPY;  Service: Pulmonary;  Laterality: Left;   VIDEO BRONCHOSCOPY WITH ENDOBRONCHIAL ULTRASOUND Bilateral 06/29/2023   Procedure: BRONCHOSCOPY, WITH EBUS;  Surgeon: [name], MD;  Location: [facility] ENDOSCOPY;  Service: Pulmonary;  Laterality: Bilateral;    Family History  Problem Relation Age of Onset   Cancer Father        prostate   Prostate cancer Father 24 - 45       metaststic   Asthma Brother    Cancer Daughter        breast   Breast cancer Daughter 99    Social History:  reports that she has never smoked. She has never been exposed to tobacco smoke. She does not have any smokeless tobacco history on file. She reports that she does not drink alcohol  and does not use drugs.  Allergies:  Allergies  Allergen Reactions   Ciprofloxacin Itching, Swelling and Other (See Comments)    Possibly causing tremors?   Crestor  [Rosuvastatin ] Other (See Comments)    Myalgia and back pain   Lisinopril   Cough   Ofev  [Nintedanib] Nausea Only    Medications: I have reviewed the patient's current medications.  Results for orders placed or performed during the hospital encounter of 11/09/23 (from the past 48 hours)  CBC with Differential/Platelet     Status: Abnormal   Collection Time: 12/01/23  4:00 PM  Result Value Ref Range   WBC 8.3 4.0 - 10.5 K/uL   RBC 2.99 (L) 3.87 - 5.11 MIL/uL   Hemoglobin 8.7 (L) 12.0 - 15.0 g/dL   HCT 73.0 (L) 63.9 - 53.9 %   MCV 90.0 80.0 - 100.0 fL   MCH 29.1 26.0 - 34.0 pg   MCHC 32.3 30.0 - 36.0 g/dL   RDW 81.5 (H) 88.4 - 84.4 %   Platelets 100  (L) 150 - 400 K/uL   nRBC 0.0 0.0 - 0.2 %   Neutrophils Relative % 69 %   Neutro Abs 5.8 1.7 - 7.7 K/uL   Lymphocytes Relative 16 %   Lymphs Abs 1.3 0.7 - 4.0 K/uL   Monocytes Relative 11 %   Monocytes Absolute 0.9 0.1 - 1.0 K/uL   Eosinophils Relative 2 %   Eosinophils Absolute 0.1 0.0 - 0.5 K/uL   Basophils Relative 1 %   Basophils Absolute 0.1 0.0 - 0.1 K/uL   Immature Granulocytes 1 %   Abs Immature Granulocytes 0.09 (H) 0.00 - 0.07 K/uL    Comment: Performed at [facility], [address] [place], [place] [address]  Glucose, capillary     Status: Abnormal   Collection Time: 12/01/23  8:53 PM  Result Value Ref Range   Glucose-Capillary 104 (H) 70 - 99 mg/dL    Comment: Glucose reference range applies only to samples taken after fasting for at least 8 hours.  Basic metabolic panel with GFR     Status: Abnormal   Collection Time: 12/02/23  5:07 AM  Result Value Ref Range   Sodium 130 (L) 135 - 145 mmol/L   Potassium 4.2 3.5 - 5.1 mmol/L   Chloride 102 98 - 111 mmol/L   CO2 19 (L) 22 - 32 mmol/L   Glucose, Bld 104 (H) 70 - 99 mg/dL    Comment: Glucose reference range applies only to samples taken after fasting for at least 8 hours.   BUN 23 8 - 23 mg/dL   Creatinine, Ser 9.10 0.44 - 1.00 mg/dL   Calcium  9.2 8.9 - 10.3 mg/dL   GFR, Estimated >39 >39 mL/min    Comment: (NOTE) Calculated using the CKD-EPI Creatinine Equation (2021)    Anion gap 9 5 - 15    Comment: Performed at [facility], [address] [place], [place] [address]  CBC     Status: Abnormal   Collection Time: 12/02/23  5:07 AM  Result Value Ref Range   WBC 6.7 4.0 - 10.5 K/uL   RBC 2.69 (L) 3.87 - 5.11 MIL/uL   Hemoglobin 8.0 (L) 12.0 - 15.0 g/dL   HCT 76.2 (L) 63.9 - 53.9 %   MCV 88.1 80.0 - 100.0 fL   MCH 29.7 26.0 - 34.0 pg   MCHC 33.8 30.0 - 36.0 g/dL   RDW 81.6 (H) 88.4 - 84.4 %   Platelets 97 (L) 150 - 400 K/uL    Comment: REPEATED TO VERIFY Immature Platelet Fraction may  be clinically indicated, consider ordering this additional test [id number]    nRBC 0.0 0.0 - 0.2 %    Comment: Performed at [facility]  Lab, [address] [place], [place] [address]  Glucose, capillary     Status: Abnormal   Collection Time: 12/02/23  5:30 AM  Result Value Ref Range   Glucose-Capillary 113 (H) 70 - 99 mg/dL    Comment: Glucose reference range applies only to samples taken after fasting for at least 8 hours.  Glucose, capillary     Status: Abnormal   Collection Time: 12/02/23 11:27 AM  Result Value Ref Range   Glucose-Capillary 128 (H) 70 - 99 mg/dL    Comment: Glucose reference range applies only to samples taken after fasting for at least 8 hours.  Glucose, capillary     Status: Abnormal   Collection Time: 12/02/23  4:56 PM  Result Value Ref Range   Glucose-Capillary 137 (H) 70 - 99 mg/dL    Comment: Glucose reference range applies only to samples taken after fasting for at least 8 hours.  Glucose, capillary     Status: Abnormal   Collection Time: 12/02/23  9:18 PM  Result Value Ref Range   Glucose-Capillary 187 (H) 70 - 99 mg/dL    Comment: Glucose reference range applies only to samples taken after fasting for at least 8 hours.  Basic metabolic panel with GFR     Status: Abnormal   Collection Time: 12/03/23  5:27 AM  Result Value Ref Range   Sodium 131 (L) 135 - 145 mmol/L   Potassium 4.2 3.5 - 5.1 mmol/L   Chloride 103 98 - 111 mmol/L   CO2 20 (L) 22 - 32 mmol/L   Glucose, Bld 120 (H) 70 - 99 mg/dL    Comment: Glucose reference range applies only to samples taken after fasting for at least 8 hours.   BUN 23 8 - 23 mg/dL   Creatinine, Ser 9.06 0.44 - 1.00 mg/dL   Calcium  9.1 8.9 - 10.3 mg/dL   GFR, Estimated >39 >39 mL/min    Comment: (NOTE) Calculated using the CKD-EPI Creatinine Equation (2021)    Anion gap 8 5 - 15    Comment: Performed at [facility], [address] [place], [place] [address]  CBC     Status: Abnormal   Collection Time:  12/03/23  5:27 AM  Result Value Ref Range   WBC 6.8 4.0 - 10.5 K/uL   RBC 2.54 (L) 3.87 - 5.11 MIL/uL   Hemoglobin 7.5 (L) 12.0 - 15.0 g/dL   HCT 77.3 (L) 63.9 - 53.9 %   MCV 89.0 80.0 - 100.0 fL   MCH 29.5 26.0 - 34.0 pg   MCHC 33.2 30.0 - 36.0 g/dL   RDW 81.4 (H) 88.4 - 84.4 %   Platelets 82 (L) 150 - 400 K/uL    Comment: REPEATED TO VERIFY Immature Platelet Fraction may be clinically indicated, consider ordering this additional test [id number]    nRBC 0.0 0.0 - 0.2 %    Comment: Performed at [facility], [address], [place], [place] [address]  Prealbumin     Status: None   Collection Time: 12/03/23  5:27 AM  Result Value Ref Range   Prealbumin 23 18 - 38 mg/dL    Comment: Performed at [facility], [address] [place], [place] [address]  Glucose, capillary     Status: Abnormal   Collection Time: 12/03/23  6:02 AM  Result Value Ref Range   Glucose-Capillary 128 (H) 70 - 99 mg/dL    Comment: Glucose reference range applies only to samples taken after  fasting for at least 8 hours.  Glucose, capillary     Status: Abnormal   Collection Time: 12/03/23 11:42 AM  Result Value Ref Range   Glucose-Capillary 120 (H) 70 - 99 mg/dL    Comment: Glucose reference range applies only to samples taken after fasting for at least 8 hours.    CT HEAD WO CONTRAST ( ) Result Date: 12/02/2023 EXAM: CT HEAD WITHOUT CONTRAST 12/02/2023 03:21:42 PM TECHNIQUE: CT of the head was performed without the administration of intravenous contrast. Automated exposure control, iterative reconstruction, and/or weight based adjustment of the mA/kV was utilized to reduce the radiation dose to as low as reasonably achievable. COMPARISON: 11/06/2023 CLINICAL HISTORY: Mental status change, persistent or worsening FINDINGS: BRAIN AND VENTRICLES: No acute hemorrhage. No evidence of acute infarct. Nonspecific periventricular and subcortical white matter hypoattenuation, consistent with chronic  microvascular ischemic changes. Prominent ventricles suggesting underlying parenchymal volume loss. No extra-axial collection. No mass effect or midline shift. Atherosclerotic calcifications of carotid siphons. ORBITS: No acute abnormality. SINUSES: Mucosal thickening in right maxillary sinus and left sphenoid sinus. Frothy material in left sphenoid sinus. SOFT TISSUES AND SKULL: No acute soft tissue abnormality. No skull fracture. IMPRESSION: 1. No acute intracranial abnormality. 2. Prominent ventricles suggesting underlying parenchymal volume loss. 3. Nonspecific periventricular and subcortical white matter hypoattenuation consistent with chronic microvascular ischemic changes. 4. Paranasal sinus disease involving the right maxillary and left sphenoid sinuses, with frothy material in the left sphenoid sinus. Electronically signed by: Lonni Necessary MD 12/02/2023 06:58 PM EST RP Workstation: [id number]    ROS Blood pressure (!) 139/46, pulse 79, temperature 98.6 F (37 C), resp. rate 18, height 5' 6.5 (1.689 m), weight 55 kg, SpO2 100%. Physical Exam Constitutional:      Appearance: Normal appearance.  HENT:     Head: Normocephalic and atraumatic.     Right Ear: Tympanic membrane is without lesions and middle ear aerated, ear canal and external ear normal.     Left Ear: Tympanic membrane is without lesions and middle ear aerated, ear canal and external ear normal.     Nose: Nose has a pack in the right side that is a [employer].  There is no blood coming from t either side of the nose.  Turbinates with mild hypertrophy, No significant swelling or masses.     Oral cavity/oropharynx: I see no excessive amount of blood dripping down the back of her throat and she is not coughing up any blood mucous membranes are moist. No lesions or masses    Larynx: normal voice. Mirror attempted without success    Eyes:     Extraocular Movements: Extraocular movements intact.     Conjunctiva/sclera:  Conjunctivae normal.     Pupils: Pupils are equal, round, and reactive to light.  Cardiovascular:     Rate and Rhythm: Normal rate.  Pulmonary:     Effort: Pulmonary effort is normal.  Musculoskeletal:     Cervical back: Normal range of motion and neck supple. No rigidity.  Lymphadenopathy:     Cervical: No cervical adenopathy or masses.salivary glands without lesions. .  Neurological:     Mental Status: He is alert. CN 2-12 intact. No nystagmus      Assessment/Plan: Epistaxis-right now I do not see any excessive bleeding and the packing is in place.  She is rather sedated appearing.  The family member states that is hard to manipulate anything with her so I do not think taking this pack out at this time would be  prudent especially with almost no bleeding.  The plaque usually stays in for about 5 days to rule out clotting and certainly she is going to clot slower being on a blood thinner.  If it increases in the amount of bleeding then I will be forced to proceed with further manipulation.  Norleen Notice 12/03/2023, 2:46 PM

## 2023-12-03 NOTE — Progress Notes (Signed)
 Daily Progress Note   Patient Name: [name]       Date: 12/03/2023 DOB: 05/31/1940  Age: 83 y.o. MRN#: [id number] Attending Physician: Urbano Albright, MD Primary Care Physician: [name], MD Admit Date: 11/09/2023  Reason for Consultation/Follow-up: Establishing goals of care   Length of Stay: 24  Current Medications: Scheduled Meds:   anastrozole   1 mg Oral Daily   aspirin  EC  81 mg Oral Daily   atorvastatin   40 mg Oral Daily   diclofenac  Sodium  2 g Topical QID   diltiazem   120 mg Oral Daily   escitalopram  10 mg Oral QHS   feeding supplement  237 mL Oral BID BM   gabapentin   100 mg Oral TID   Gerhardt's butt cream   Topical BID   hydroxychloroquine   200 mg Oral Daily   insulin  aspart  0-6 Units Subcutaneous TID WC   levothyroxine   100 mcg Oral Daily   lidocaine   1 patch Transdermal Q24H   megestrol  400 mg Oral BID   methylphenidate  10 mg Oral BID WC   multivitamin  15 mL Oral Daily   nystatin   5 mL Mouth/Throat QID   pantoprazole   40 mg Oral BID AC   polyethylene glycol  17 g Oral Daily   sodium chloride   1 g Oral BID WC   sucralfate   1 g Oral TID WC & HS    Continuous Infusions:  sodium chloride  50 mL/hr at 12/03/23 1135    PRN Meds: acetaminophen , albuterol , alum & mag hydroxide-simeth, baclofen, bisacodyl, diphenhydrAMINE , guaiFENesin -dextromethorphan , magnesium  hydroxide, oxymetazoline, prochlorperazine **OR** prochlorperazine **OR** prochlorperazine, senna-docusate, traMADol   Physical Exam Vitals reviewed.  Constitutional:      General: She is sleeping. She is not in acute distress.    Appearance: She is ill-appearing.  HENT:     Nose:     Left Nostril: Epistaxis present.  Cardiovascular:     Rate and Rhythm: Normal rate.  Pulmonary:      Effort: Pulmonary effort is normal.  Skin:    General: Skin is dry.             Vital Signs: BP (!) 134/58 (BP Location: Right Arm)   Pulse 83   Temp 98.7 F (37.1 C) (Oral)   Resp 19   Ht 5' 6.5 (1.689  m)   Wt 55 kg   SpO2 100%   BMI 19.28 kg/m  SpO2: SpO2: 100 % O2 Device: O2 Device: Room Air O2 Flow Rate:       Patient Active Problem List   Diagnosis Date Noted   Protein-calorie malnutrition, severe 11/19/2023   Acute right hemiparesis (HCC) 11/06/2023   Normocytic anemia 11/06/2023   History of lung cancer 11/06/2023   Cerebellar stroke, acute (HCC) 11/06/2023   NSTEMI (non-ST elevated myocardial infarction) (HCC) 10/30/2023   Allergic rhinitis due to pollen 10/19/2023   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   Abnormal gait 10/19/2023   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   Bloating 10/19/2023   Centrilobular emphysema (HCC) 10/19/2023   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   Diabetic mononeuropathy associated with type 2 diabetes mellitus (HCC) 10/19/2023   Family history of colon cancer in father 10/19/2023   Flatulence, eructation and gas pain 10/19/2023   Gastroesophageal reflux disease 10/19/2023   History of colonic polyps 10/19/2023   High glucose level 10/19/2023   Headache disorder 10/19/2023   Hyperglycemia due to type 2 diabetes mellitus (HCC) 10/19/2023   History of endocarditis in adulthood 10/19/2023   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/19/2023   Iron deficiency anemia 10/19/2023   Parietoalveolar pneumopathy (HCC) 10/19/2023   Osteoarthritis 10/19/2023   Neuropathy 10/19/2023   Personal history of malignant neoplasm of breast 10/19/2023   Myopathy, unspecified 10/19/2023   Pure hypercholesterolemia 10/19/2023   Stage 3a chronic kidney disease (HCC) 10/19/2023   Traumatic plantar fasciitis 10/19/2023   Atherosclerosis of coronary artery 10/19/2023   CAD S/P percutaneous coronary angioplasty 10/19/2023   Chronic systolic  heart failure (HCC) 90/76/7974   Type 2 diabetes mellitus with other specified complication (HCC) 10/19/2023   Adverse effect of antihyperlipidemic and antiarteriosclerotic drugs, initial encounter 10/19/2023   Adverse effect of antihyperlipidemic drug 10/19/2023   Family history of malignant neoplasm of gastrointestinal tract 10/19/2023   Shortness of breath 10/19/2023   Encounter for screening for malignant neoplasm of colon 10/19/2023   Decreased estrogen level 10/19/2023   Elevated TSH 10/19/2023   Acquired hypothyroidism 10/19/2023   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   Systemic lupus erythematosus (HCC) 10/19/2023   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) 10/19/2023   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) 10/19/2023   Malignant neoplasm of lung (HCC) 10/19/2023   Essential hypertension 10/19/2023   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) 07/22/2023   Mediastinal adenopathy 06/29/2023   Lung mass 06/20/2023   Pulmonary embolism (HCC) 06/19/2023   S/P left mastectomy 03/31/2023   Genetic testing 03/10/2023   Family history of malignant neoplasm of breast 02/26/2023   Family history of malignant neoplasm of prostate 02/26/2023   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) 02/18/2023   Long term (current) use of anticoagulants 10/13/2022   Sepsis due to pneumonia (HCC) 10/11/2022   AKI (acute kidney injury) 10/11/2022   Paroxysmal atrial flutter (HCC) 10/11/2022   Diarrhea 10/11/2022   Controlled type 2 diabetes mellitus without complication, without long-term current use of insulin  (HCC) 10/11/2022   Mixed hyperlipidemia 10/11/2022   Atherosclerosis of aorta 10/11/2022   Diabetes mellitus type 2, noninsulin dependent (HCC) 10/11/2022   Benign hypertension 10/11/2022   Acute febrile illness 10/11/2022   Atrial flutter with rapid ventricular response (HCC) 10/11/2022   Chronic diastolic  CHF (congestive heart failure) (HCC) 10/11/2022   Syncope 04/25/2022   Near syncope 02/18/2019   Hyponatremia  02/18/2019   ILD (interstitial lung disease) (HCC) 07/30/2016   Dyspnea 07/16/2016   Bibasilar crackles 07/16/2016   History of systemic lupus erythematosus (SLE) (HCC) 07/16/2016   Cough 07/16/2016   Encounter for monitoring ACE-inhibitor therapy 07/16/2016   History of asthma 07/16/2016   Hypothyroidism 01/03/2015   Primary hypertension 01/03/2015   UTI (urinary tract infection) 01/03/2015   Drug-induced hypersensitivity reaction 01/03/2015   Lupus 01/03/2015   Pyrexia 01/02/2015    Palliative Care Assessment & Plan   Patient Profile: 83 y.o. female  with past medical history of A.fib on Eliquis , recent dx of adenocarcinoma of lung undergoing radiation treatment, hx breast cancer s/p mastectomy, CAD, SLE with interstitial lung disease, hypertension, DM2, hypothyroidism, recent admission for NSTEMI with right heart cath (10/4-10/7)  admitted on 11/09/2023 with right sided weakness, slurred speech, altered mental status.    Initial CT imaging of the head indicated a large hypodensity on the right side, suggestive of a large territory infarct. 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 There were also bilateral embolic watershed infarcts and bilateral cerebellum infarcts identified.  Patient ultimately went to CIR for rehabilitation for her functional deficits.   PMT has been consulted to assist with goals of care conversation.  Today's Discussion: Reviewed chart and received update from attending and SW. Patient dozes in and out of sleep. She looks frail and has epistaxis of her left side and packing in the right side. Her meal tray is at bedside- she has not eaten. Patient's  daughter [name] and SW are at bedside.  Patient's daughter asks if PMT can explain comfort options. Explained the difference between continuing aggressive treatment measures or transitioning to comfort focused care. We discussed potential locations for end of life care. Patient's daughter shared the patient told her this morning that she wants to keep fighting. I asked if she thought the patient understood the totality of her condition and failure to thrive. I shared my concern that the patient has had a significant decline in function despite our best efforts and aggressive treatment. I shared that I thought the patient was appropriate for hospice care. Daughter would like to consider options while continuing current scope of care.  I encouraged her daughter to consider the patient's quality of life, overall suffering, what would be acceptable to her moving forward. Encouraged daughter to read Hard choices and Gone from my sight booklets.   Encouraged family to call with questions or concerns. Plan for PMT to follow up Sunday.  Recommendations/Plan: Continue DNR/DNI Continue to treat the treatable- family has hopes of meaningful recovery Ongoing goals of care discussions PMT will follow-up Sunday 11/9 Please notify PMT if needs arise sooner    Code Status:    Code Status Orders  (From admission, onward)           Start     Ordered   12/02/23 1242  Do not attempt resuscitation (DNR)- Limited -Do Not Intubate (DNI)  (Code Status)  Continuous       Question Answer Comment  If pulseless and not breathing No CPR or chest compressions.   In Pre-Arrest Conditions (Patient Is Breathing and Has A Pulse) Do not intubate. Provide all appropriate non-invasive medical interventions. Avoid ICU transfer unless indicated or required.   Consent: Discussion documented in EHR or advanced directives reviewed  12/02/23 1242         Extensive chart review has been completed prior to seeing  the patient including labs, vital signs, imaging, progress/consult notes, orders, medications, and available advance directive documents.   Care plan was discussed with bedside RN, Dr. Babs, Daphne Satterfield NP, and SW  Time spent: 55 minutes  Thank you for allowing the Palliative Medicine Team to assist in the care of this patient.   Stephane CHRISTELLA Palin, NP  Please contact Palliative Medicine Team phone at [phone] for questions and concerns.

## 2023-12-03 NOTE — Progress Notes (Addendum)
 1                                                        PROGRESS NOTE    Subjective/Complaints: No real changes yesterday. Appreciate palliative care input. CT results as below   ROS: Limited due to cognitive/behavioral   Objective:   CT HEAD WO CONTRAST ( ) Result Date: 12/02/2023 EXAM: CT HEAD WITHOUT CONTRAST 12/02/2023 03:21:42 PM TECHNIQUE: CT of the head was performed without the administration of intravenous contrast. Automated exposure control, iterative reconstruction, and/or weight based adjustment of the mA/kV was utilized to reduce the radiation dose to as low as reasonably achievable. COMPARISON: 11/06/2023 CLINICAL HISTORY: Mental status change, persistent or worsening FINDINGS: BRAIN AND VENTRICLES: No acute hemorrhage. No evidence of acute infarct. Nonspecific periventricular and subcortical white matter hypoattenuation, consistent with chronic microvascular ischemic changes. Prominent ventricles suggesting underlying parenchymal volume loss. No extra-axial collection. No mass effect or midline shift. Atherosclerotic calcifications of carotid siphons. ORBITS: No acute abnormality. SINUSES: Mucosal thickening in right maxillary sinus and left sphenoid sinus. Frothy material in left sphenoid sinus. SOFT TISSUES AND SKULL: No acute soft tissue abnormality. No skull fracture. IMPRESSION: 1. No acute intracranial abnormality. 2. Prominent ventricles suggesting underlying parenchymal volume loss. 3. Nonspecific periventricular and subcortical white matter hypoattenuation consistent with chronic microvascular ischemic changes. 4. Paranasal sinus disease involving the right maxillary and left sphenoid sinuses, with frothy material in the left sphenoid sinus. Electronically signed by: Lonni Necessary MD 12/02/2023 06:58 PM EST RP Workstation: [id number]      Recent Labs    12/01/23 1600 12/02/23 0507  WBC 8.3 6.7  HGB 8.7* 8.0*  HCT 26.9* 23.7*  PLT 100* 97*    Recent Labs     12/02/23 0507  NA 130*  K 4.2  CL 102  CO2 19*  GLUCOSE 104*  BUN 23  CREATININE 0.89  CALCIUM  9.2      Intake/Output Summary (Last 24 hours) at 12/03/2023 0648 Last data filed at 12/03/2023 0500 Gross per 24 hour  Intake 955.16 ml  Output --  Net 955.16 ml     Wound 12/02/23 1653 Pressure Injury Sacrum Mid Stage 2 -  Partial thickness loss of dermis presenting as a shallow open injury with a red, pink wound bed without slough. (Active)    Physical Exam: Vital Signs Blood pressure (!) 134/58, pulse 80, temperature 98.7 F (37.1 C), temperature source Oral, resp. rate 18, height 5' 6.5 (1.689 m), weight 55 kg, SpO2 100%.     Constitutional: No distress . Vital signs reviewed. HEENT: NCAT, EOMI, oral membranes moist Neck: supple Cardiovascular: RRR without murmur. No JVD    Respiratory/Chest: CTA Bilaterally without wheezes or rales. Normal effort    GI/Abdomen: BS +, non-tender, non-distended Ext: no clubbing, cyanosis, or edema Psych: pleasant and cooperative  Skin: warm Neurological: remains essentially non-verbal. Selectively opens eyes and makes contact.  Decreased sensation to pain in right lower extremity.  Continued RUE and RLE weakness. Does not initiate movement on right  Tone: MAS 2 right shoulder, MAS 3 right elbow, MAS 0 wrists, fingers, MAS 2 knee flexors Musculoskeletal: Mild tenderness left cervical paraspinal muscles,minimal pain L shoulder rom     Assessment/Plan: 1. Functional deficits which require 3+ hours per day of interdisciplinary therapy in a comprehensive inpatient rehab setting. Physiatrist  is providing close team supervision and 24 hour management of active medical problems listed below. Physiatrist and rehab team continue to assess barriers to discharge/monitor patient progress toward functional and medical goals  Care Tool:  Bathing    Body parts bathed by patient: Right arm, Left arm, Chest, Abdomen, Front perineal area    Body parts bathed by helper: Abdomen, Chest, Front perineal area, Buttocks, Right upper leg, Left upper leg, Right lower leg, Left lower leg, Face     Bathing assist Assist Level: Total Assistance - Patient < 25%     Upper Body Dressing/Undressing Upper body dressing   What is the patient wearing?: Hospital gown only, Pull over shirt    Upper body assist Assist Level: Total Assistance - Patient < 25%    Lower Body Dressing/Undressing Lower body dressing      What is the patient wearing?: Underwear/pull up     Lower body assist Assist for lower body dressing: Total Assistance - Patient < 25%     Toileting Toileting    Toileting assist Assist for toileting: Dependent - Patient 0%     Transfers Chair/bed transfer  Transfers assist  Chair/bed transfer activity did not occur: Safety/medical concerns (fatigue)  Chair/bed transfer assist level: Total Assistance - Patient < 25%     Locomotion Ambulation   Ambulation assist   Ambulation activity did not occur: Safety/medical concerns (weakness/fatigue/pain)          Walk 10 feet activity   Assist  Walk 10 feet activity did not occur: Safety/medical concerns        Walk 50 feet activity   Assist Walk 50 feet with 2 turns activity did not occur: Safety/medical concerns         Walk 150 feet activity   Assist Walk 150 feet activity did not occur: Safety/medical concerns         Walk 10 feet on uneven surface  activity   Assist Walk 10 feet on uneven surfaces activity did not occur: Safety/medical concerns         Wheelchair     Assist Is the patient using a wheelchair?: Yes Type of Wheelchair: Manual    Wheelchair assist level: Dependent - Patient 0%      Wheelchair 50 feet with 2 turns activity    Assist        Assist Level: Dependent - Patient 0%   Wheelchair 150 feet activity     Assist      Assist Level: Dependent - Patient 0%   Blood pressure (!)  134/58, pulse 80, temperature 98.7 F (37.1 C), temperature source Oral, resp. rate 18, height 5' 6.5 (1.689 m), weight 55 kg, SpO2 100%.  Medical Problem List and Plan: 1. Functional deficits secondary to  bilateral embolic shower, embolic pattern, etiology:  Afib and missed Eliquis  doses vs from NSTEMI with cardiac cath.              -patient may  shower             -ELOS/Goals: SNF pending?-              Con't CIR PT, OT and SLP as tolerated  - Ordered PRAFO/WHO right upper extremity, sling for right upper extremity also ordered  11/6-7-appreciate palliative care assistance. CT of head demonstrates no acute changes which would account for presentation.   -suspect a mood component    -pt is now DNR/DNI    -will need to pursue SNF vs hospice  if no improvement over weekend   -consider neurology follow up to re-assess   2.  Antithrombotics: -DVT/anticoagulation:  Pharmaceutical: Eliquis  Transitioned from IV Heparin  to Eliquis  10/14             -antiplatelet therapy: Asprin 81 mg  3. Pain Management: Gabapentin  and Tylenol  as needed  -Voltaren  R shoulder for R shoulder pain  - 10/17 patient now reporting some pain in her left shoulder, suspect from increased use with therapy.  Right shoulder doing better.  Will try Voltaren  gel to left shoulder, consider imaging if does not improve  10/18 added kpad for neck/shoulder as well  -10/22-23 Continue voltaren  gel for occasional shoulder pain  - Can try K-pad for neck as she has some soreness this morning  11-19-25 no complaints of pain  10/28, intermittent shoulder pain, had pain yesterday and had rapid response called yesterday to r/o cardiac event and had EKG/Trop, was found to have pain in trapezius muscle. Xray L shoulder, try lidocaine  patch  10/30 neck pain reported this AM, spoke with nursing early afternoon, pain improved with heatpack/repositioning and voltaren  gel  11/1 tramadol  prn for severe pain, C spine Xray, she just got tylenol   few min before I saw her  11/2 C spine xray with spondylosis, neck pain doing better today  11/3- Pt required tramadol  last night for shoulder pain- was better this AM  11/7-  using kpad, tramadol  4. Mood/Behavior/Sleep: LCSW to follow for evaluation and support when available.              -antipsychotic agents: N/A  -11/6 pt on lexapro for mood--change to hs  -increase ritalin to 10mg  to see if it helps arousal/mood 5. Neuropsych/cognition: This patient is not  capable of making decisions on her own behalf. 6. Skin/Wound Care: routine pressure relief measures.  7. Fluids/Electrolytes/Nutrition: monitor intake and output. Follow up labs in a.m.              - SLP consult: Carb modified diet + Ensure  -nutrition consult for decreased p.o. intake  10/18 intake remains minimal. RD following   -added megace for appetitte  10/19 obsv today. Recheck labs, prealbumin in AM   10/20 Prealbumin 21 this AM, continue to monitor PO intake  10/22 Hx of pain with swallowing, appears has been chronic for several, nursing reports daughter said yesterday she was seen by GI in the past. Started PPI  10/23 called GI for consult- she has been seen by [facility] GI outpatient  10/24 Seen by GI appreciate- Barium swallow, Sucralfate   She ate 40-50% meals yesterday, poor intake but a little better  11-19-25: Eating 25 to 50% of meals  Seen by nutrition- for severe malnutrition, appreciate assistance  11/7 po intake is minimal, eating 5-10 % -continue megace   -recent bmet stable except for BUN which was sl elevated   -labs pending for today 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 Continue aspirin  and statin.   9. CAD: 10/4-10/7 admitted for NSTEMI  10. HLD: Lipitor 40 mg  11. HTN: monitor blood pressures on  Hyzaar, Cardizem , and Aldactone      12/03/2023    4:27 AM 12/02/2023    7:40 PM 12/02/2023    2:32 PM  Vitals with BMI  Systolic 134 154 857  Diastolic 58 55 51  Pulse 80 98 99  -11/2 DBP a little  low but stable overall, continue diltiazem   11/5- BP a little high Systolic and low Diastolic- will con't to  monitor 12. Paroxymal Atrial Flutter/Fib: continue Eliquis  2.5 bid  11/5- Regular rhythm this AM 13. T2DM: HgbA1c 6.0, resumed home Amaryl   - monitor cbg AC/HS with SSI.  CBG (last 3)  Recent Labs    12/02/23 1656 12/02/23 2118 12/03/23 0602  GLUCAP 137* 187* 128*  - 10/15 Will order CBG/SSI  - CBGs well-controlled, holding amaryl . But not eating much  -11/1 CBGs well controlled 11/7 generally well controlled due to lack of eating- con't to encourage pt to eat more. 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  Hypothyroidism: Synthroid  17.  Chronic anemia: Stable, monitor CBC.   -10/17 Hgb improved to 10 yesterday  -10/18 lower HGB at 8.4 suspect dilutional due to IVF, recheck tomorrow  - 10-26: Hemoglobin from 10-8.2.  May be dilutional due to IV fluids, will get iron studies and FOBT to evaluate --FOBT positive, hesitant to hold [facility] given recent embolic strokes, will increase GI prophylaxis to 40 mg twice daily and defer AC discussion to primary team.  Trend hemoglobin. -10/27 stable overall at 8.2, appears to fluctuate, continue to monitor for now for reasons as above  -10/30 slightly lower at 7.9, will call GI Dr. Fayne- called left voicemail with my callback information -11/1 discussed with GI Dr. Fayne yesterday, continue current and monitor trend for now, call GI back if continues to decline -11/2 HGB 8.1 stable, continue tomitor 11/7   hgb has dropped further from  8.0, to 7.5 may be due to recent epistaxis, platelets sl less too, again   -will contact GI again for further recs.  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  Continue to monitor  -10/20 NA 131 today, continue to monitor   -10/23 NA stable 131    10/30 NA 129, overall stable. Appears hyponatremia has been chronic for several years.   -10/31, Na stable overall, appears has been low for many years, discuss with nephrology Dr. Melia, continue to monitor as long in stable range, will check TSH/cortisol next labs  Recheck BMP/check TSH/Cortisol Monday. Hyponatremia has been chronic for years, dont think she needs fluid restrictions  11/6- Na 130- is stable 21. Azotemia  -10/20 IVF NS 50ml/hr  -10/23 BUN and CR improved, continue IVF at night for poor oral intake, can hold if drinks 1L  10-25: Creatinine increased to 1.02; increasing IV fluids as above  10-26: Creatinine 0.94, DC IV fluids due to SIADH as above.  -10/27 Cr/BUN  trending up as IVF stopped, recheck tomorrow  -10/30 Bun 20, 0.93 Cr stable overall   Recheck tomorrow  11/7==BUN still at 23, eating nothing--continue  IVF   =recheck labs tomorrow 22.  Urinary incontinence/UTI  - 10-25 urinalysis mildly positive; start Keflex  500 mg twice daily for 7 days; follow sensitivities- Ecoli sensitive to cefazolin - continue current regimen  23.  Right hemiparesis/spasticity  - 10-26: Due to cognitive issues and lethargy, hesitant to schedule muscle relaxers.  Start baclofen 5 mg 3 times daily as needed.  -Continue PRN as above to avoid sedation  -Consider outpatient botox  24. Epistaxis  -plavix still on hold.  -rhino rocket dc'ed yesterday  *addendum  11/7 @1330 --recurrent bleeding. Rhino rocket replaced   -reach out to ENT re: recs     LOS: 24 days A FACE TO FACE EVALUATION WAS PERFORMED  Arthea ONEIDA Gunther 12/03/2023, 6:48 AM

## 2023-12-03 NOTE — Plan of Care (Signed)
  Problem: Consults Goal: RH STROKE PATIENT EDUCATION Description: See Patient Education module for education specifics  Outcome: Not Progressing   Problem: RH SAFETY Goal: RH STG ADHERE TO SAFETY PRECAUTIONS W/ASSISTANCE/DEVICE Description: STG Adhere to Safety Precautions With cues Assistance/Device. Outcome: Not Progressing   Problem: RH KNOWLEDGE DEFICIT Goal: RH STG INCREASE KNOWLEDGE OF DIABETES Description: Patient and dtr will be able to manage DM using educational resources for medications and dietary modification independently Outcome: Not Progressing Goal: RH STG INCREASE KNOWLEDGE OF HYPERTENSION Description: Patient and dtr will be able to manage HTN using educational resources for medications and dietary modification independently Outcome: Not Progressing Goal: RH STG INCREASE KNOWLEGDE OF HYPERLIPIDEMIA Description: Patient and dtr will be able to manage HLD using educational resources for medications and dietary modification independently Outcome: Not Progressing Goal: RH STG 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 Description: Patient and dtr will be able to manage secondary risks using educational resources for medications and dietary modification independently Outcome: Not Progressing   Problem: Education: Goal: Ability to describe self-care measures that may prevent or decrease complications (Diabetes Survival Skills Education) will improve Outcome: Not Progressing Goal: Individualized Educational Video(s) Outcome: Not Progressing   Problem: Coping: Goal: Ability to adjust to condition or change in health will improve Outcome: Not Progressing   Problem: Fluid Volume: Goal: Ability to maintain a balanced intake and output will improve Outcome: Not Progressing   Problem: Health Behavior/Discharge Planning: Goal: Ability to identify and utilize available resources and services will improve Outcome: Not Progressing Goal: Ability to manage health-related needs  will improve Outcome: Not Progressing   Problem: Metabolic: Goal: Ability to maintain appropriate glucose levels will improve Outcome: Not Progressing   Problem: Nutritional: Goal: Maintenance of adequate nutrition will improve Outcome: Not Progressing Goal: Progress toward achieving an optimal weight will improve Outcome: Not Progressing   Problem: Skin Integrity: Goal: Risk for impaired skin integrity will decrease Outcome: Not Progressing   Problem: Tissue Perfusion: Goal: Adequacy of tissue perfusion will improve Outcome: Not Progressing

## 2023-12-04 LAB — BASIC METABOLIC PANEL WITH GFR
Anion gap: 8 (ref 5–15)
BUN: 19 mg/dL (ref 8–23)
CO2: 20 mmol/L — ABNORMAL LOW (ref 22–32)
Calcium: 9 mg/dL (ref 8.9–10.3)
Chloride: 104 mmol/L (ref 98–111)
Creatinine, Ser: 0.88 mg/dL (ref 0.44–1.00)
GFR, Estimated: >60 mL/min (ref >60)
Glucose, Bld: 86 mg/dL (ref 70–99)
Potassium: 3.9 mmol/L (ref 3.5–5.1)
Sodium: 132 mmol/L — ABNORMAL LOW (ref 135–145)

## 2023-12-04 LAB — CBC
HCT: 21.3 % — ABNORMAL LOW (ref 36.0–46.0)
Hemoglobin: 7.1 g/dL — ABNORMAL LOW (ref 12.0–15.0)
MCH: 29.6 pg (ref 26.0–34.0)
MCHC: 33.3 g/dL (ref 30.0–36.0)
MCV: 88.8 fL (ref 80.0–100.0)
Platelets: 67 K/uL — ABNORMAL LOW (ref 150–400)
RBC: 2.4 MIL/uL — ABNORMAL LOW (ref 3.87–5.11)
RDW: 18.3 % — ABNORMAL HIGH (ref 11.5–15.5)
WBC: 6.3 K/uL (ref 4.0–10.5)
nRBC: 0 % (ref 0.0–0.2)

## 2023-12-04 LAB — GLUCOSE, CAPILLARY
Glucose-Capillary: 81 mg/dL (ref 70–99)
Glucose-Capillary: 66 mg/dL — ABNORMAL LOW (ref 70–99)
Glucose-Capillary: 99 mg/dL (ref 70–99)
Glucose-Capillary: 101 mg/dL — ABNORMAL HIGH (ref 70–99)
Glucose-Capillary: 112 mg/dL — ABNORMAL HIGH (ref 70–99)

## 2023-12-04 MED ORDER — GABAPENTIN 100 MG PO CAPS
100.0000 mg | ORAL_CAPSULE | Freq: Two times a day (BID) | ORAL | Status: DC
  Administered 2023-12-04 – 2023-12-05 (×2): 100 mg via ORAL
  Filled 2023-12-04 (×2): qty 1

## 2023-12-04 MED ORDER — BACLOFEN 5 MG HALF TABLET: 5.0000 mg | ORAL_TABLET | Freq: Two times a day (BID) | ORAL | Status: DC | PRN

## 2023-12-04 NOTE — Progress Notes (Signed)
   Subjective:    Patient ID: [name], female    DOB: 29-Oct-1940, 83 y.o.   MRN: [id number]  HPI epistaxis for week and on eloquis. Right sided pack. No bleeding of significance since yesterday        Objective:   Physical Exam  Pack in place and no bleeding evident       Assessment & Plan:   No bleeding so continue the pack for about 5 days

## 2023-12-04 NOTE — Progress Notes (Signed)
 [facility] Progress Note  SUBJECTIVE:   Interval history: [name] was seen and evaluated today at bedside. GI was requested to re-evaluate patient given down trending hemoglobin.  [name] initially presented to hospital on 11/06/2023 with chief complaint of slurred speech and weakness.  MRI of brain showed multiple acute infarcts. 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  Medical history also significant for breast cancer status post mastectomy in March 2025.  History of gastroesophageal reflux disease, hypothyroidism, diabetes mellitus, SLE with interstitial lung disease and atrial fibrillation on Eliquis  (which has been held in setting of down-trending Hgb).   Patient was previously seen during this hospitalization by gastroenterology for dysphagia, recommended modified barium swallow, study completed and recommendations reviewed.  Since last evaluation by GI on 11/24/23 there has been marginal improvement in odynophagia, she is on PPI therapy as well as sucralfate . Had epistaxis from R nare on 12/01/23 which required placement of rhino rocket, ENT has subsequently evaluated. Per review of record, bowel movements have appeared brown in color. Fecal occult blood testing is pending. Palliative care has been involved in patient's care for goals of care discussion.  Today, at bedside, patient being fed meal by grand daughter. Multiple other family members present at bedside. Patient noted that she has been experiencing some dysphagia and odynophagia. She denied chest pain and shortness of breath. Possibly some upper abdominal pain. She engages with conversation, though does appear quite fatigued.   Pertinent prior GI studies:  EGD 09/10/2015 for functional dyspepsia and GERD (Dr. Dyane) showed widely patent Schatzki ring, small hiatal hernia,  gastritis (biopsy showed benign tissue, H. pylori negative).   Colonoscopy 02/11/2022 for personal history of colon polyp and colorectal cancer in her father (43s) (Dr. Saintclair) showed tubular adenoma x 2 in transverse colon, transverse colon diverticula, internal hemorrhoids.  Past Medical History:  Diagnosis Date   Acute bronchitis 12/17/2021   Acute cystitis 02/20/2019   Arthritis    Atrial flutter (HCC)    Breast cancer (HCC)    left  2009 and 2025   Diabetes mellitus without complication (HCC)    Dyspnea    mostly with exertion   GERD (gastroesophageal reflux disease)    History of radiation therapy    Left Lung-08/09/23-10/01/23- Dr. 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[facility])    Lupus    Pneumonia 07/31/2021   Past Surgical History:  Procedure Laterality Date   BREAST BIOPSY Left 01/15/2023   US  LT BREAST BX W LOC DEV 1ST LESION IMG BX SPEC US  GUIDE 01/15/2023 [facility] MAMMOGRAPHY   BREAST LUMPECTOMY Left 2009   BRONCHIAL BIOPSY  06/29/2023   Procedure: BRONCHOSCOPY, WITH BIOPSY;  Surgeon: [name], MD;  Location: [facility] ENDOSCOPY;  Service: Pulmonary;;   BRONCHIAL BRUSHINGS  06/29/2023   Procedure: BRONCHOSCOPY, WITH BRUSH BIOPSY;  Surgeon: [name], MD;  Location: [facility] ENDOSCOPY;  Service: Pulmonary;;   BRONCHIAL NEEDLE ASPIRATION BIOPSY  06/29/2023   Procedure: BRONCHOSCOPY, WITH NEEDLE ASPIRATION BIOPSY;  Surgeon: [name], MD;  Location: [facility] ENDOSCOPY;  Service: Pulmonary;;   IR 3D INDEPENDENT WKST  05/29/2021   IR ANGIO INTRA EXTRACRAN SEL COM CAROTID INNOMINATE BILAT MOD SED  05/29/2021   IR ANGIO VERTEBRAL SEL VERTEBRAL BILAT MOD SED  05/29/2021   LEFT HEART CATH AND CORONARY ANGIOGRAPHY N/A 11/01/2023   Procedure: LEFT HEART  CATH AND CORONARY ANGIOGRAPHY;  Surgeon: Kathryn Sharper, MD;  Location: [facility] INVASIVE CV LAB;  Service: Cardiovascular;  Laterality: N/A;   SIMPLE MASTECTOMY WITH AXILLARY SENTINEL NODE BIOPSY Left  03/31/2023   Procedure: LEFT MASTECTOMY;  Surgeon: Kathryn Berg, MD;  Location: [facility] OR;  Service: General;  Laterality: Left;  LMA PEC BLOCK   VIDEO BRONCHOSCOPY WITH ENDOBRONCHIAL NAVIGATION Left 06/29/2023   Procedure: VIDEO BRONCHOSCOPY WITH ENDOBRONCHIAL NAVIGATION;  Surgeon: [name], MD;  Location: [facility] ENDOSCOPY;  Service: Pulmonary;  Laterality: Left;   VIDEO BRONCHOSCOPY WITH ENDOBRONCHIAL ULTRASOUND Bilateral 06/29/2023   Procedure: BRONCHOSCOPY, WITH EBUS;  Surgeon: [name], MD;  Location: [facility] ENDOSCOPY;  Service: Pulmonary;  Laterality: Bilateral;   Current Facility-Administered Medications  Medication Dose Route Frequency Provider Last Rate Last Admin   acetaminophen  (TYLENOL ) tablet 325-650 mg  325-650 mg Oral Q4H PRN Jerilynn Daphne SAILOR, NP   650 mg at 12/02/23 1947   albuterol  (PROVENTIL ) (2.5 MG/3ML) 0.083% nebulizer solution 2.5 mg  2.5 mg Nebulization Q6H PRN Lawrence, Brandi N, NP       alum & mag hydroxide-simeth (MAALOX/MYLANTA) 200-200-20 MG/5ML suspension 30 mL  30 mL Oral Q4H PRN Lawrence, Brandi N, NP   30 mL at 11/16/23 2055   anastrozole  (ARIMIDEX ) tablet 1 mg  1 mg Oral Daily Jerilynn Daphne SAILOR, NP   1 mg at 12/04/23 0955   aspirin  EC tablet 81 mg  81 mg Oral Daily Jerilynn Daphne SAILOR, NP   81 mg at 12/04/23 9044   atorvastatin  (LIPITOR) tablet 40 mg  40 mg Oral Daily Lawrence, Brandi N, NP   40 mg at 12/04/23 0955   baclofen (LIORESAL) tablet 5 mg  5 mg Oral BID PRN Raulkar, Krutika P, MD       bisacodyl (DULCOLAX) suppository 10 mg  10 mg Rectal Daily PRN Jerilynn Daphne SAILOR, NP   10 mg at 11/15/23 1845   diclofenac  Sodium (VOLTAREN ) 1 % topical gel 2 g  2 g Topical QID Urbano Albright, MD   2 g at 12/04/23 1152   diltiazem  (CARDIZEM  CD) 24 hr capsule 120 mg  120 mg Oral Daily Jerilynn Daphne SAILOR, NP   120 mg at 12/04/23 0956   diphenhydrAMINE  (BENADRYL ) capsule 25 mg  25 mg Oral Q6H PRN Jerilynn Daphne SAILOR, NP       escitalopram (LEXAPRO) tablet 10 mg  10  mg Oral QHS Swartz, Zachary T, MD   10 mg at 12/03/23 2158   feeding supplement (ENSURE PLUS HIGH PROTEIN) liquid 237 mL  237 mL Oral BID BM Urbano Albright, MD   237 mL at 12/01/23 1335   gabapentin  (NEURONTIN ) capsule 100 mg  100 mg Oral BID Raulkar, Krutika P, MD       Gerhardt's butt cream   Topical BID Urbano Albright, MD   Given at 12/04/23 0956   guaiFENesin -dextromethorphan  (ROBITUSSIN DM) 100-10 MG/5ML syrup 5-10 mL  5-10 mL Oral Q6H PRN Jerilynn Daphne SAILOR, NP   10 mL at 11/10/23 2041   hydroxychloroquine  (PLAQUENIL ) tablet 200 mg  200 mg Oral Daily Jerilynn Daphne SAILOR, NP   200 mg at 12/04/23 9043   insulin  aspart (novoLOG ) injection 0-6 Units  0-6 Units Subcutaneous TID WC Urbano Albright, MD   1 Units at 11/30/23 1827   levothyroxine  (SYNTHROID ) tablet 100 mcg  100 mcg Oral Daily Lawrence, Brandi N, NP   100 mcg at 12/04/23 0604   lidocaine  (LIDODERM ) 5 % 1 patch  1 patch Transdermal Q24H  Urbano Albright, MD   1 patch at 12/04/23 1401   magnesium  hydroxide (MILK OF MAGNESIA) suspension 15 mL  15 mL Oral Daily PRN Lovorn, Megan, MD   15 mL at 11/15/23 0539   megestrol (MEGACE) 400 MG/10ML suspension 400 mg  400 mg Oral BID Swartz, Zachary T, MD   400 mg at 12/04/23 0956   methylphenidate (RITALIN) tablet 10 mg  10 mg Oral BID WC Swartz, Zachary T, MD   10 mg at 12/04/23 1136   multivitamin liquid 15 mL  15 mL Oral Daily Urbano Albright, MD   15 mL at 12/02/23 9177   nystatin  (MYCOSTATIN ) 100000 UNIT/ML suspension 500,000 Units  5 mL Mouth/Throat QID Urbano Albright, MD   500,000 Units at 12/02/23 1947   pantoprazole  (PROTONIX ) EC tablet 40 mg  40 mg Oral BID AC Engler, Morgan C, DO   40 mg at 12/04/23 0604   polyethylene glycol (MIRALAX  / GLYCOLAX ) packet 17 g  17 g Oral Daily Lovorn, Megan, MD   17 g at 12/02/23 9177   prochlorperazine (COMPAZINE) tablet 5-10 mg  5-10 mg Oral Q6H PRN Jerilynn Daphne SAILOR, NP       Or   prochlorperazine (COMPAZINE) suppository 12.5 mg  12.5 mg  Rectal Q6H PRN Jerilynn Daphne SAILOR, NP       Or   prochlorperazine (COMPAZINE) injection 5-10 mg  5-10 mg Intravenous Q6H PRN Jerilynn Daphne SAILOR, NP       senna-docusate (Senokot-S) tablet 1 tablet  1 tablet Oral QHS PRN Lawrence, Brandi N, NP   1 tablet at 11/13/23 2010   sodium chloride  tablet 1 g  1 g Oral BID WC Engler, Morgan C, DO   1 g at 12/04/23 0955   sucralfate  (CARAFATE ) 1 GM/10ML suspension 1 g  1 g Oral TID WC & HS Kriss Stagger H, DO   1 g at 12/03/23 2203   traMADol  (ULTRAM ) tablet 50 mg  50 mg Oral Q6H PRN Urbano Albright, MD   50 mg at 12/02/23 1724   Allergies as of 11/09/2023 - Review Complete 11/09/2023  Allergen Reaction Noted   Ciprofloxacin Itching, Swelling, and Other (See Comments) 06/20/2014   Crestor  [rosuvastatin ] Other (See Comments) 09/15/2019   Lisinopril  Cough 05/27/2022   Ofev  [nintedanib] Nausea Only 01/13/2022   Review of Systems:  Review of Systems  Respiratory:  Negative for shortness of breath.   Cardiovascular:  Negative for chest pain.  Gastrointestinal:  Negative for nausea and vomiting.       Dysphagia. Odynophagia.    OBJECTIVE:   Temp:  [98.4 F (36.9 C)-99 F (37.2 C)] 98.4 F (36.9 C) (11/08 0545) Pulse Rate:  [88] 88 (11/08 0545) Resp:  [18] 18 (11/08 0545) BP: (140-149)/(47-51) 149/51 (11/08 0545) SpO2:  [100 %] 100 % (11/08 0545) Last BM Date : 12/04/23 Physical Exam Constitutional:      General: She is not in acute distress.    Appearance: She is not toxic-appearing or diaphoretic.  Cardiovascular:     Rate and Rhythm: Normal rate and regular rhythm.  Pulmonary:     Effort: No respiratory distress.     Breath sounds: Normal breath sounds.  Abdominal:     General: Bowel sounds are normal. There is no distension.     Palpations: Abdomen is soft.     Tenderness: There is no abdominal tenderness. There is no guarding.     Labs: Recent Labs    12/02/23 0507 12/03/23 0527 12/04/23 0526  WBC 6.7  6.8 6.3  HGB 8.0*  7.5* 7.1*  HCT 23.7* 22.6* 21.3*  PLT 97* 82* 67*   BMET Recent Labs    12/02/23 0507 12/03/23 0527 12/04/23 0526  NA 130* 131* 132*  K 4.2 4.2 3.9  CL 102 103 104  CO2 19* 20* 20*  GLUCOSE 104* 120* 86  BUN 23 23 19   CREATININE 0.89 0.93 0.88  CALCIUM  9.2 9.1 9.0   LFT No results for input(s): PROT, ALBUMIN, AST, ALT, ALKPHOS, BILITOT, BILIDIR, IBILI in the last 72 hours. PT/INR No results for input(s): LABPROT, INR in the last 72 hours. Diagnostic imaging: CT HEAD WO CONTRAST ( ) Result Date: 12/02/2023 EXAM: CT HEAD WITHOUT CONTRAST 12/02/2023 03:21:42 PM TECHNIQUE: CT of the head was performed without the administration of intravenous contrast. Automated exposure control, iterative reconstruction, and/or weight based adjustment of the mA/kV was utilized to reduce the radiation dose to as low as reasonably achievable. COMPARISON: 11/06/2023 CLINICAL HISTORY: Mental status change, persistent or worsening FINDINGS: BRAIN AND VENTRICLES: No acute hemorrhage. No evidence of acute infarct. Nonspecific periventricular and subcortical white matter hypoattenuation, consistent with chronic microvascular ischemic changes. Prominent ventricles suggesting underlying parenchymal volume loss. No extra-axial collection. No mass effect or midline shift. Atherosclerotic calcifications of carotid siphons. ORBITS: No acute abnormality. SINUSES: Mucosal thickening in right maxillary sinus and left sphenoid sinus. Frothy material in left sphenoid sinus. SOFT TISSUES AND SKULL: No acute soft tissue abnormality. No skull fracture. IMPRESSION: 1. No acute intracranial abnormality. 2. Prominent ventricles suggesting underlying parenchymal volume loss. 3. Nonspecific periventricular and subcortical white matter hypoattenuation consistent with chronic microvascular ischemic changes. 4. Paranasal sinus disease involving the right maxillary and left sphenoid sinuses, with frothy material in the  left sphenoid sinus. Electronically signed by: Lonni Necessary MD 12/02/2023 06:58 PM EST RP Workstation: [id number]   IMPRESSION: Normocytic anemia, mild worsening from chronic (12/04/23 Hgb 7.1 from 11/23/23 Hgb 8.2) Dysphagia, multifactorial Epistaxis Metastatic pulmonary adenocarcinoma  Acute CVA History breast cancer 03/2023 s/p radiation therapy Atrial fibrillation, Eliquis  now on hold SLE with interstitial lung disease  PLAN: -No signs of acute GI bleeding based on patient evaluation and chart review -Has had recent epistaxis which could contribute to mild drop in Hgb, ENT has evaluated -Discussed with patient the possibility to complete EGD and colonoscopy to evaluate anemia, she respectfully declined this evaluation -Can follow up FOBT results, though will not change management as patient declined endoscopic evaluation -Continue supportive measures with PPI and sucralfate  -Appreciate Palliative Care -Ok to use Eliquis  from GI perspective -Please recall [facility] with questions   LOS: 25 days   Estefana Keas, [facility]

## 2023-12-04 NOTE — Progress Notes (Signed)
 1                                                        PROGRESS NOTE    Subjective/Complaints: GI consulted due to continued decrease in Hgb, stool occult ordered Patient is taking medications and is more alert today as per nursing  ROS: Limited due to cognitive/behavioral   Objective:   CT HEAD WO CONTRAST ( ) Result Date: 12/02/2023 EXAM: CT HEAD WITHOUT CONTRAST 12/02/2023 03:21:42 PM TECHNIQUE: CT of the head was performed without the administration of intravenous contrast. Automated exposure control, iterative reconstruction, and/or weight based adjustment of the mA/kV was utilized to reduce the radiation dose to as low as reasonably achievable. COMPARISON: 11/06/2023 CLINICAL HISTORY: Mental status change, persistent or worsening FINDINGS: BRAIN AND VENTRICLES: No acute hemorrhage. No evidence of acute infarct. Nonspecific periventricular and subcortical white matter hypoattenuation, consistent with chronic microvascular ischemic changes. Prominent ventricles suggesting underlying parenchymal volume loss. No extra-axial collection. No mass effect or midline shift. Atherosclerotic calcifications of carotid siphons. ORBITS: No acute abnormality. SINUSES: Mucosal thickening in right maxillary sinus and left sphenoid sinus. Frothy material in left sphenoid sinus. SOFT TISSUES AND SKULL: No acute soft tissue abnormality. No skull fracture. IMPRESSION: 1. No acute intracranial abnormality. 2. Prominent ventricles suggesting underlying parenchymal volume loss. 3. Nonspecific periventricular and subcortical white matter hypoattenuation consistent with chronic microvascular ischemic changes. 4. Paranasal sinus disease involving the right maxillary and left sphenoid sinuses, with frothy material in the left sphenoid sinus. Electronically signed by: Lonni Necessary MD 12/02/2023 06:58 PM EST RP Workstation: [id number]      Recent Labs    12/03/23 0527 12/04/23 0526  WBC 6.8 6.3  HGB 7.5*  7.1*  HCT 22.6* 21.3*  PLT 82* 67*    Recent Labs    12/03/23 0527 12/04/23 0526  NA 131* 132*  K 4.2 3.9  CL 103 104  CO2 20* 20*  GLUCOSE 120* 86  BUN 23 19  CREATININE 0.93 0.88  CALCIUM  9.1 9.0      Intake/Output Summary (Last 24 hours) at 12/04/2023 1237 Last data filed at 12/04/2023 1145 Gross per 24 hour  Intake 1556.36 ml  Output --  Net 1556.36 ml     Wound 12/02/23 1653 Pressure Injury Sacrum Mid Stage 2 -  Partial thickness loss of dermis presenting as a shallow open injury with a red, pink wound bed without slough. (Active)    Physical Exam: Vital Signs Blood pressure (!) 149/51, pulse 88, temperature 98.4 F (36.9 C), temperature source Axillary, resp. rate 18, height 5' 6.5 (1.689 m), weight 55 kg, SpO2 100%.     Constitutional: No distress . Vital signs reviewed. Alert, sitting up in chair, taking medications, smiling HEENT: NCAT, EOMI, oral membranes moist Neck: supple Cardiovascular: RRR without murmur. No JVD    Respiratory/Chest: CTA Bilaterally without wheezes or rales. Normal effort    GI/Abdomen: BS +, non-tender, non-distended Ext: no clubbing, cyanosis, or edema Psych: pleasant and cooperative  Skin: warm PRIOR EXAMS: Neurological: remains essentially non-verbal. Selectively opens eyes and makes contact.  Decreased sensation to pain in right lower extremity.  Continued RUE and RLE weakness. Does not initiate movement on right  Tone: MAS 2 right shoulder, MAS 3 right elbow, MAS 0 wrists, fingers, MAS 2 knee flexors Musculoskeletal: Mild tenderness left cervical  paraspinal muscles,minimal pain L shoulder rom     Assessment/Plan: 1. Functional deficits which require 3+ hours per day of interdisciplinary therapy in a comprehensive inpatient rehab setting. Physiatrist is providing close team supervision and 24 hour management of active medical problems listed below. Physiatrist and rehab team continue to assess barriers to  discharge/monitor patient progress toward functional and medical goals  Care Tool:  Bathing    Body parts bathed by patient: Right arm, Left arm, Chest, Abdomen, Front perineal area   Body parts bathed by helper: Abdomen, Chest, Front perineal area, Buttocks, Right upper leg, Left upper leg, Right lower leg, Left lower leg, Face     Bathing assist Assist Level: Total Assistance - Patient < 25%     Upper Body Dressing/Undressing Upper body dressing   What is the patient wearing?: Hospital gown only, Pull over shirt    Upper body assist Assist Level: Total Assistance - Patient < 25%    Lower Body Dressing/Undressing Lower body dressing      What is the patient wearing?: Underwear/pull up     Lower body assist Assist for lower body dressing: Total Assistance - Patient < 25%     Toileting Toileting    Toileting assist Assist for toileting: Dependent - Patient 0%     Transfers Chair/bed transfer  Transfers assist  Chair/bed transfer activity did not occur: Safety/medical concerns (fatigue)  Chair/bed transfer assist level: Total Assistance - Patient < 25%     Locomotion Ambulation   Ambulation assist   Ambulation activity did not occur: Safety/medical concerns (weakness/fatigue/pain)          Walk 10 feet activity   Assist  Walk 10 feet activity did not occur: Safety/medical concerns        Walk 50 feet activity   Assist Walk 50 feet with 2 turns activity did not occur: Safety/medical concerns         Walk 150 feet activity   Assist Walk 150 feet activity did not occur: Safety/medical concerns         Walk 10 feet on uneven surface  activity   Assist Walk 10 feet on uneven surfaces activity did not occur: Safety/medical concerns         Wheelchair     Assist Is the patient using a wheelchair?: Yes Type of Wheelchair: Manual    Wheelchair assist level: Dependent - Patient 0%      Wheelchair 50 feet with 2 turns  activity    Assist        Assist Level: Dependent - Patient 0%   Wheelchair 150 feet activity     Assist      Assist Level: Dependent - Patient 0%   Blood pressure (!) 149/51, pulse 88, temperature 98.4 F (36.9 C), temperature source Axillary, resp. rate 18, height 5' 6.5 (1.689 m), weight 55 kg, SpO2 100%.  Medical Problem List and Plan: 1. Functional deficits secondary to  bilateral embolic shower, embolic pattern, etiology:  Afib and missed Eliquis  doses vs from NSTEMI with cardiac cath.              -patient may  shower             -ELOS/Goals: SNF pending?-              Continue CIR PT, OT and SLP as tolerated  - Ordered PRAFO/WHO right upper extremity, sling for right upper extremity also ordered  11/6-7-appreciate palliative care assistance. CT of head demonstrates no  acute changes which would account for presentation.   -suspect a mood component    -pt is now DNR/DNI    -will need to pursue SNF vs hospice if no improvement over weekend   -consider neurology follow up to re-assess   2.  Anemia: Eliquis  held due to dropping Hgb, GI consulted             -antiplatelet therapy: Asprin 81 mg   3. Pain Management: Gabapentin  decreased to BID due to lethargy  -Voltaren  R shoulder for R shoulder pain  - 10/17 patient now reporting some pain in her left shoulder, suspect from increased use with therapy.  Right shoulder doing better.  Will try Voltaren  gel to left shoulder, consider imaging if does not improve  10/18 added kpad for neck/shoulder as well  -10/22-23 Continue voltaren  gel for occasional shoulder pain  - Can try K-pad for neck as she has some soreness this morning  11-19-25 no complaints of pain  10/28, intermittent shoulder pain, had pain yesterday and had rapid response called yesterday to r/o cardiac event and had EKG/Trop, was found to have pain in trapezius muscle. Xray L shoulder, try lidocaine  patch  10/30 neck pain reported this AM, spoke with  nursing early afternoon, pain improved with heatpack/repositioning and voltaren  gel  11/1 tramadol  prn for severe pain, C spine Xray, she just got tylenol  few min before I saw her  11/2 C spine xray with spondylosis, neck pain doing better today  11/3- Pt required tramadol  last night for shoulder pain- was better this AM  11/7-  using kpad, tramadol  4. Mood/Behavior/Sleep: LCSW to follow for evaluation and support when available.              -antipsychotic agents: N/A  -11/6 pt on lexapro for mood--change to hs  -increase ritalin to 10mg  to see if it helps arousal/mood 5. Neuropsych/cognition: This patient is not  capable of making decisions on her own behalf. 6. Skin/Wound Care: routine pressure relief measures.  7. Fluids/Electrolytes/Nutrition: monitor intake and output. Follow up labs in a.m.              - SLP consult: Carb modified diet + Ensure  -nutrition consult for decreased p.o. intake  10/18 intake remains minimal. RD following   -added megace for appetitte  10/19 obsv today. Recheck labs, prealbumin in AM   10/20 Prealbumin 21 this AM, continue to monitor PO intake  10/22 Hx of pain with swallowing, appears has been chronic for several, nursing reports daughter said yesterday she was seen by GI in the past. 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 Continue aspirin  and statin.   9. CAD: 10/4-10/7 admitted for NSTEMI  10. HLD: Lipitor 40 mg  11. HTN: monitor blood pressures on  Hyzaar, Cardizem , and Aldactone      12/04/2023    5:45 AM 12/03/2023    7:47 PM 12/03/2023    2:18 PM  Vitals with BMI  Systolic  149 140 139  Diastolic 51 47 46  Pulse 88 88 79  -DBP consistently hypotensive:, decrease baclofen to BID prn  12.  Paroxymal Atrial Flutter/Fib: continue Eliquis  2.5 bid  11/5- Regular rhythm this AM 13. T2DM: HgbA1c 6.0, resumed home Amaryl   - monitor cbg AC/HS with SSI.  CBG (last 3)  Recent Labs    12/04/23 0614 12/04/23 1121 12/04/23 1159  GLUCAP 81 66* 99  - 10/15 Will order CBG/SSI  - CBGs well-controlled, holding amaryl . But not eating much  -11/1 CBGs well controlled 11/7 generally well controlled due to lack of eating- con't to encourage pt to eat more. 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  Hypothyroidism: Synthroid  17.  Chronic anemia: Stable, monitor CBC.   -10/17 Hgb improved to 10 yesterday  -10/18 lower HGB at 8.4 suspect dilutional due to IVF, recheck tomorrow  - 10-26: Hemoglobin from 10-8.2.  May be dilutional due to IV fluids, will get iron studies and FOBT to evaluate --FOBT positive, hesitant to hold [facility] given recent embolic strokes, will increase GI prophylaxis to 40 mg twice daily and defer AC discussion to primary team.  Trend hemoglobin. -10/27 stable overall at 8.2, appears to fluctuate, continue to monitor for now for reasons as above  -10/30 slightly lower at 7.9, will call GI Dr. Fayne- called left voicemail with my callback information -11/1 discussed with GI Dr. Fayne yesterday, continue current and monitor trend for now, call GI back if continues to decline -11/2 HGB 8.1 stable, continue tomitor 11/7   hgb has dropped further from  8.0, to 7.5 may be due to recent epistaxis, platelets sl less too, again   -will contact GI again for further recs. 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  Continue to monitor  -10/20 NA 131 today, continue to monitor   -10/23 NA stable 131    10/30 NA 129, overall stable. Appears hyponatremia has been chronic for several years.   -10/31, Na stable overall, appears has been low for many years, discuss with nephrology Dr. Melia, continue to monitor as long in stable range, will check TSH/cortisol next labs  Recheck BMP/check TSH/Cortisol Monday. Hyponatremia has been chronic for years, dont think she needs fluid restrictions  11/6- Na 130- is stable 21. Azotemia  -10/20 IVF NS 50ml/hr  -10/23 BUN and CR improved, continue IVF at night for poor oral intake, can hold if drinks 1L  10-25: Creatinine increased to 1.02; increasing IV fluids as above  10-26: Creatinine 0.94, DC IV fluids due to SIADH as above.  -10/27 Cr/BUN  trending up as IVF stopped, recheck tomorrow  -10/30 Bun 20, 0.93 Cr stable overall   Recheck tomorrow  11/7==BUN still at 23, eating nothing--continue  IVF   =recheck labs tomorrow 22.  Urinary incontinence/UTI  - 10-25 urinalysis mildly positive; start Keflex  500 mg twice daily for 7 days; follow sensitivities- Ecoli sensitive to cefazolin - continue current regimen  23.  Right hemiparesis/spasticity  - 10-26: Due to cognitive issues and lethargy, hesitant to schedule muscle relaxers.  Start baclofen 5 mg 3 times daily as needed.  -Continue PRN as above to avoid sedation  -Consider outpatient botox  24. Epistaxis  -plavix still on hold.  -rhino rocket dc'ed yesterday  *addendum 11/7 @  1330--recurrent bleeding. Rhino rocket replaced   -reach out to ENT re: recs     LOS: 25 days A FACE TO FACE EVALUATION WAS PERFORMED  Sven SQUIBB Noah Lembke 12/04/2023, 12:37 PM

## 2023-12-04 NOTE — Progress Notes (Signed)
 Pt alert this morning, oriented to person. voice. She tolerated some PO medication. No new nasal bleeding observed at this time. Pt in bed with eyes closed, even and unlabored breathing. She has no complaints at this time.

## 2023-12-04 NOTE — Progress Notes (Signed)
 Pt's CBG: 66. Standing orders placed and pt encouraged to drink juice per order. Pt was able to tolerate after encouragement. CBG recheck: 99. Pt in room with family. She has no complaints at this time.

## 2023-12-04 NOTE — Plan of Care (Signed)
  Problem: Consults Goal: RH STROKE PATIENT EDUCATION Description: See Patient Education module for education specifics  Outcome: Progressing   Problem: RH SAFETY Goal: RH STG ADHERE TO SAFETY PRECAUTIONS W/ASSISTANCE/DEVICE Description: STG Adhere to Safety Precautions With cues Assistance/Device. Outcome: Progressing   Problem: RH KNOWLEDGE DEFICIT Goal: RH STG INCREASE KNOWLEDGE OF DIABETES Description: Patient and dtr will be able to manage DM using educational resources for medications and dietary modification independently Outcome: Progressing Goal: RH STG INCREASE KNOWLEDGE OF HYPERTENSION Description: Patient and dtr will be able to manage HTN using educational resources for medications and dietary modification independently Outcome: Progressing Goal: RH STG INCREASE KNOWLEGDE OF HYPERLIPIDEMIA Description: Patient and dtr will be able to manage HLD using educational resources for medications and dietary modification independently Outcome: Progressing Goal: RH STG 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 Description: Patient and dtr will be able to manage secondary risks using educational resources for medications and dietary modification independently Outcome: Progressing   Problem: Education: Goal: Ability to describe self-care measures that may prevent or decrease complications (Diabetes Survival Skills Education) will improve Outcome: Progressing Goal: Individualized Educational Video(s) Outcome: Progressing   Problem: Coping: Goal: Ability to adjust to condition or change in health will improve Outcome: Progressing   Problem: Fluid Volume: Goal: Ability to maintain a balanced intake and output will improve Outcome: Progressing   Problem: Health Behavior/Discharge Planning: Goal: Ability to identify and utilize available resources and services will improve Outcome: Progressing Goal: Ability to manage health-related needs will improve Outcome: Progressing    Problem: Metabolic: Goal: Ability to maintain appropriate glucose levels will improve Outcome: Progressing   Problem: Nutritional: Goal: Maintenance of adequate nutrition will improve Outcome: Progressing Goal: Progress toward achieving an optimal weight will improve Outcome: Progressing   Problem: Skin Integrity: Goal: Risk for impaired skin integrity will decrease Outcome: Progressing   Problem: Tissue Perfusion: Goal: Adequacy of tissue perfusion will improve Outcome: Progressing

## 2023-12-05 LAB — GLUCOSE, CAPILLARY
Glucose-Capillary: 100 mg/dL — ABNORMAL HIGH (ref 70–99)
Glucose-Capillary: 106 mg/dL — ABNORMAL HIGH (ref 70–99)
Glucose-Capillary: 120 mg/dL — ABNORMAL HIGH (ref 70–99)
Glucose-Capillary: 122 mg/dL — ABNORMAL HIGH (ref 70–99)

## 2023-12-05 LAB — CBC WITH DIFFERENTIAL/PLATELET
Abs Immature Granulocytes: 0.04 K/uL (ref 0.00–0.07)
Basophils Absolute: 0 K/uL (ref 0.0–0.1)
Basophils Relative: 0 %
Eosinophils Absolute: 0.2 K/uL (ref 0.0–0.5)
Eosinophils Relative: 3 %
HCT: 24.7 % — ABNORMAL LOW (ref 36.0–46.0)
Hemoglobin: 7.9 g/dL — ABNORMAL LOW (ref 12.0–15.0)
Immature Granulocytes: 1 %
Lymphocytes Relative: 17 %
Lymphs Abs: 1.2 K/uL (ref 0.7–4.0)
MCH: 29 pg (ref 26.0–34.0)
MCHC: 32 g/dL (ref 30.0–36.0)
MCV: 90.8 fL (ref 80.0–100.0)
Monocytes Absolute: 0.5 K/uL (ref 0.1–1.0)
Monocytes Relative: 7 %
Neutro Abs: 4.8 K/uL (ref 1.7–7.7)
Neutrophils Relative %: 72 %
Platelets: 70 K/uL — ABNORMAL LOW (ref 150–400)
RBC: 2.72 MIL/uL — ABNORMAL LOW (ref 3.87–5.11)
RDW: 18.1 % — ABNORMAL HIGH (ref 11.5–15.5)
WBC: 6.7 K/uL (ref 4.0–10.5)
nRBC: 0 % (ref 0.0–0.2)

## 2023-12-05 LAB — OCCULT BLOOD X 1 CARD TO LAB, STOOL: Fecal Occult Bld: POSITIVE — AB

## 2023-12-05 MED ORDER — GABAPENTIN 100 MG PO CAPS
100.0000 mg | ORAL_CAPSULE | Freq: Every day | ORAL | Status: DC
  Administered 2023-12-06 – 2023-12-08 (×3): 100 mg via ORAL
  Filled 2023-12-05 (×3): qty 1

## 2023-12-05 NOTE — Progress Notes (Signed)
 1                                                        PROGRESS NOTE    Subjective/Complaints: Patient states she does not want medications or EGD, but family states patient wants EGD, requested palliative care to speak with patient and family today to clarify goals  ROS: Limited due to cognitive/behavioral   Objective:   No results found.     Recent Labs    12/03/23 0527 12/04/23 0526  WBC 6.8 6.3  HGB 7.5* 7.1*  HCT 22.6* 21.3*  PLT 82* 67*    Recent Labs    12/03/23 0527 12/04/23 0526  NA 131* 132*  K 4.2 3.9  CL 103 104  CO2 20* 20*  GLUCOSE 120* 86  BUN 23 19  CREATININE 0.93 0.88  CALCIUM  9.1 9.0      Intake/Output Summary (Last 24 hours) at 12/05/2023 1318 Last data filed at 12/05/2023 0700 Gross per 24 hour  Intake 259 ml  Output --  Net 259 ml     Wound 12/02/23 1653 Pressure Injury Sacrum Mid Stage 2 -  Partial thickness loss of dermis presenting as a shallow open injury with a red, pink wound bed without slough. (Active)    Physical Exam: Vital Signs Blood pressure (!) 127/45, pulse 76, temperature 97.7 F (36.5 C), temperature source Oral, resp. rate 18, height 5' 6.5 (1.689 m), weight 55 kg, SpO2 100%. Constitutional: No distress . Vital signs reviewed. Alert, sitting up in chair, taking medications, smiling HEENT: NCAT, EOMI, oral membranes moist Neck: supple Cardiovascular: RRR without murmur. No JVD    Respiratory/Chest: CTA Bilaterally without wheezes or rales. Normal effort    GI/Abdomen: BS +, non-tender, non-distended Ext: no clubbing, cyanosis, or edema Psych: pleasant and cooperative  Skin: warm PRIOR EXAMS: Neurological: remains essentially non-verbal. Selectively opens eyes and makes contact.  Decreased sensation to pain in right lower extremity.  Continued RUE and RLE weakness. Does not initiate movement on right  Tone: MAS 2 right shoulder, MAS 3 right elbow, MAS 0 wrists, fingers, MAS 2 knee flexors, stable  11/9 Musculoskeletal: Mild tenderness left cervical paraspinal muscles,minimal pain L shoulder rom     Assessment/Plan: 1. Functional deficits which require 3+ hours per day of interdisciplinary therapy in a comprehensive inpatient rehab setting. Physiatrist is providing close team supervision and 24 hour management of active medical problems listed below. Physiatrist and rehab team continue to assess barriers to discharge/monitor patient progress toward functional and medical goals  Care Tool:  Bathing    Body parts bathed by patient: Right arm, Left arm, Chest, Abdomen, Front perineal area   Body parts bathed by helper: Abdomen, Chest, Front perineal area, Buttocks, Right upper leg, Left upper leg, Right lower leg, Left lower leg, Face     Bathing assist Assist Level: Total Assistance - Patient < 25%     Upper Body Dressing/Undressing Upper body dressing   What is the patient wearing?: Hospital gown only, Pull over shirt    Upper body assist Assist Level: Total Assistance - Patient < 25%    Lower Body Dressing/Undressing Lower body dressing      What is the patient wearing?: Underwear/pull up     Lower body assist Assist for lower body dressing: Total Assistance - Patient <  25%     Toileting Toileting    Toileting assist Assist for toileting: Dependent - Patient 0%     Transfers Chair/bed transfer  Transfers assist  Chair/bed transfer activity did not occur: Safety/medical concerns (fatigue)  Chair/bed transfer assist level: Total Assistance - Patient < 25%     Locomotion Ambulation   Ambulation assist   Ambulation activity did not occur: Safety/medical concerns (weakness/fatigue/pain)          Walk 10 feet activity   Assist  Walk 10 feet activity did not occur: Safety/medical concerns        Walk 50 feet activity   Assist Walk 50 feet with 2 turns activity did not occur: Safety/medical concerns         Walk 150 feet  activity   Assist Walk 150 feet activity did not occur: Safety/medical concerns         Walk 10 feet on uneven surface  activity   Assist Walk 10 feet on uneven surfaces activity did not occur: Safety/medical concerns         Wheelchair     Assist Is the patient using a wheelchair?: Yes Type of Wheelchair: Manual    Wheelchair assist level: Dependent - Patient 0%      Wheelchair 50 feet with 2 turns activity    Assist        Assist Level: Dependent - Patient 0%   Wheelchair 150 feet activity     Assist      Assist Level: Dependent - Patient 0%   Blood pressure (!) 127/45, pulse 76, temperature 97.7 F (36.5 C), temperature source Oral, resp. rate 18, height 5' 6.5 (1.689 m), weight 55 kg, SpO2 100%.  Medical Problem List and Plan: 1. Functional deficits secondary to  bilateral embolic shower, embolic pattern, etiology:  Afib and missed Eliquis  doses vs from NSTEMI with cardiac cath.              -patient may  shower             -ELOS/Goals: SNF pending?-              Continue CIR PT, OT and SLP as tolerated  - Ordered PRAFO/WHO right upper extremity, sling for right upper extremity also ordered  11/6-7-appreciate palliative care assistance. CT of head demonstrates no acute changes which would account for presentation.   -suspect a mood component    -pt is now DNR/DNI    -will need to pursue SNF vs hospice if no improvement over weekend   -consider neurology follow up to re-assess  Requested palliative care speak with family today to discuss goals of care   2.  Anemia: Eliquis  held due to dropping Hgb, GI consulted, patient told GI she did not want EGD but family says she wants EDG, see #1, will check CBC today to determine if Eliquis  can be restarted, clear to restart as per GI             -antiplatelet therapy: Asprin 81 mg   3. Pain Management: Gabapentin  decreased to HS due to lethargy  -Voltaren  R shoulder for R shoulder pain  - 10/17  patient now reporting some pain in her left shoulder, suspect from increased use with therapy.  Right shoulder doing better.  Will try Voltaren  gel to left shoulder, consider imaging if does not improve  10/18 added kpad for neck/shoulder as well  -10/22-23 Continue voltaren  gel for occasional shoulder pain  - Can try K-pad for  neck as she has some soreness this morning  11-19-25 no complaints of pain  10/28, intermittent shoulder pain, had pain yesterday and had rapid response called yesterday to r/o cardiac event and had EKG/Trop, was found to have pain in trapezius muscle. Xray L shoulder, try lidocaine  patch  10/30 neck pain reported this AM, spoke with nursing early afternoon, pain improved with heatpack/repositioning and voltaren  gel  11/1 tramadol  prn for severe pain, C spine Xray, she just got tylenol  few min before I saw her  11/2 C spine xray with spondylosis, neck pain doing better today  11/3- Pt required tramadol  last night for shoulder pain- was better this AM  11/7-  using kpad, tramadol  4. Mood/Behavior/Sleep: LCSW to follow for evaluation and support when available.              -antipsychotic agents: N/A  -11/6 pt on lexapro for mood--change to hs  Continue ritalin to 10mg  to see if it helps arousal/mood  5. Neuropsych/cognition: This patient is not  capable of making decisions on her own behalf.  6. Skin/Wound Care: routine pressure relief measures.   7. Fluids/Electrolytes/Nutrition: monitor intake and output. Follow up labs in a.m.              - SLP consult: Carb modified diet + Ensure  -nutrition consult for decreased p.o. intake  10/18 intake remains minimal. RD following   -added megace for appetitte  10/19 obsv today. Recheck labs, prealbumin in AM   10/20 Prealbumin 21 this AM, continue to monitor PO intake  10/22 Hx of pain with swallowing, appears has been chronic for several, nursing reports daughter said yesterday she was seen by GI in the past. Started  PPI  10/23 called GI for consult- she has been seen by [facility] GI outpatient  10/24 Seen by GI appreciate- Barium swallow, Sucralfate   She ate 40-50% meals yesterday, poor intake but a little better  11-19-25: Eating 25 to 50% of meals  Seen by nutrition- for severe malnutrition, appreciate assistance  11/7 po intake is minimal, eating 5-10 % -continue megace  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 Continue aspirin  and statin.   9. CAD: 10/4-10/7 admitted for NSTEMI  10. HLD: Lipitor 40 mg  11. HTN: monitor blood pressures on  Hyzaar, Cardizem , and Aldactone      12/05/2023    4:31 AM 12/04/2023    8:40 PM 12/04/2023    4:20 PM  Vitals with BMI  Systolic 127 134 862  Diastolic 45 52 50  Pulse 76 96 83  -DBP consistently hypotensive:, decrease baclofen to BID prn  12. Paroxymal Atrial Flutter/Fib: continue Eliquis  2.5 bid  11/5- Regular rhythm this AM 13. T2DM: HgbA1c 6.0, resumed home Amaryl   - monitor cbg AC/HS with SSI.  CBG (last 3)  Recent Labs    12/04/23 2106 12/05/23 0620 12/05/23 1131  GLUCAP 112* 100* 106*  - 10/15 Will order CBG/SSI  - CBGs well-controlled, holding amaryl . But not eating much  -11/1 CBGs well controlled 11/7 generally well controlled due to lack of eating- con't to encourage pt to eat more. 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  Hypothyroidism: Synthroid  17.  Chronic anemia: Stable, monitor CBC.   -10/17 Hgb improved to 10 yesterday  -10/18 lower HGB at 8.4 suspect dilutional due to IVF, recheck tomorrow  - 10-26: Hemoglobin from 10-8.2.  May be dilutional due to IV  fluids, will get iron studies and FOBT to evaluate --FOBT positive, hesitant to hold [facility] given recent embolic strokes, will increase GI prophylaxis to 40 mg twice daily and defer AC discussion to primary team.  Trend hemoglobin. -10/27 stable overall at 8.2, appears to fluctuate, continue to  monitor for now for reasons as above  -10/30 slightly lower at 7.9, will call GI Dr. Fayne- called left voicemail with my callback information -11/1 discussed with GI Dr. Fayne yesterday, continue current and monitor trend for now, call GI back if continues to decline -11/2 HGB 8.1 stable, continue tomitor 11/7   hgb has dropped further from  8.0, to 7.5 may be due to recent epistaxis, platelets sl less too, again   -will contact GI again for further recs. 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  Continue to monitor  -10/20 NA 131 today, continue to monitor   -10/23 NA stable 131    10/30 NA 129, overall stable. Appears hyponatremia has been chronic for several years.   -10/31, Na stable overall, appears has been low for many years, discuss with nephrology Dr. Melia, continue to monitor as long in stable range, will check TSH/cortisol next labs  Recheck BMP/check TSH/Cortisol Monday. Hyponatremia has been chronic for years, dont think she needs fluid restrictions  11/6- Na 130- is stable 21. Azotemia  -10/20 IVF NS 50ml/hr  -10/23 BUN and CR improved, continue IVF at night for poor oral intake, can hold if drinks 1L  10-25: Creatinine increased to 1.02; increasing IV fluids as above  10-26: Creatinine 0.94, DC IV fluids due to SIADH as above.  -10/27 Cr/BUN  trending up as IVF stopped, recheck tomorrow  -10/30 Bun 20, 0.93 Cr stable overall   Recheck tomorrow  11/7==BUN still at 23, eating nothing--continue  IVF   =recheck labs tomorrow 22.  Urinary incontinence/UTI  - 10-25 urinalysis mildly positive; start Keflex  500 mg twice daily for 7 days; follow sensitivities- Ecoli sensitive to cefazolin - continue current  regimen  23.  Right hemiparesis/spasticity  - 10-26: Due to cognitive issues and lethargy, hesitant to schedule muscle relaxers.  Start baclofen 5 mg 3 times daily as needed.  -Continue PRN as above to avoid sedation  -Consider outpatient botox  24. Epistaxis  -plavix still on hold.  -rhino rocket dc'ed yesterday  *addendum 11/7 @1330 --recurrent bleeding. Rhino rocket replaced  Appreciate ENT following     LOS: 26 days A FACE TO FACE EVALUATION WAS PERFORMED  Gussie Towson P Sakiya Stepka 12/05/2023, 1:18 PM

## 2023-12-05 NOTE — Progress Notes (Signed)
 Daily Progress Note   Patient Name: [name]       Date: 12/05/2023 DOB: [birth year]  Age: 83 y.o. MRN#: [id number] Attending Physician: Urbano Albright, MD Primary Care Physician: [name], MD Admit Date: 11/09/2023  Reason for Consultation/Follow-up: Establishing goals of care  Subjective: Medical records reviewed including progress notes, labs and imaging.  Patient assessed at the bedside.  She more alert today, struggling to think of the word hospital but oriented otherwise. Denies pain or other distressing symptoms, states she feels better today. Her daughter and darra are present visiting.   Created space and opportunity for patient and family's thoughts and feelings on patient's current illness. Discussed her energy levels, appetite, and overall tenuous prognosis. Counseled on treatment options and different paths to pursue - comfort focus, supportive care without EGD, or proceeding with EGD.   [name] shares that patient was able to have several visits from family yesterday - they were very concerned that she was closer to EOL. Patient also visited with her minister. She expressed her desire to improve and fight during these visits. I acknowledged that anything can happen at any time, sharing that I was quite worried she was approaching end-of-life sooner rather than later when I last saw her. When I asked her directly, patient was clear that she was not ready for end of life or comfort care at this time. However, she would want comfort care if she was unable to engage with others or get out of bed, similarly to last week, but for several days and if it was unlikely she would bounce back.  After some encouragement from her daughter, patient shares she is agreeable to EGD and taking her medications.  Questions and  concerns addressed. PMT will continue to support holistically.   Length of Stay: 26   Physical Exam Vitals and nursing note reviewed.  Constitutional:      General: She is not in acute distress. HENT:     Head: Normocephalic and atraumatic.  Cardiovascular:     Rate and Rhythm: Normal rate.  Pulmonary:     Effort: Pulmonary effort is normal.  Skin:    General: Skin is warm and dry.  Neurological:     Mental Status: She is alert.  Psychiatric:        Mood and Affect: Mood normal.        Speech: Speech is delayed.            Vital Signs: BP (!) 127/45 (BP Location: Right Arm)   Pulse 76   Temp 97.7 F (36.5 C) (Oral)   Resp 18  Ht 5' 6.5 (1.689 m)   Wt 55 kg   SpO2 100%   BMI 19.28 kg/m  SpO2: SpO2: 100 % O2 Device: O2 Device: Room Air O2 Flow Rate:     Palliative Care Assessment & Plan   Patient Profile: 83 y.o. female  with past medical history of A.fib on Eliquis , recent dx of adenocarcinoma of lung undergoing radiation treatment, hx breast cancer s/p mastectomy, CAD, SLE with interstitial lung disease, hypertension, DM2, hypothyroidism, recent admission for NSTEMI with right heart cath (10/4-10/7)  admitted on 11/09/2023 with right sided weakness, slurred speech, altered mental status.    Initial CT imaging of the head indicated a large hypodensity on the right side, suggestive of a large territory infarct. 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 There were also bilateral embolic watershed infarcts and bilateral cerebellum infarcts identified.  Patient ultimately went to CIR for rehabilitation for her functional deficits.   PMT has been consulted to assist with goals of care conversation.  Assessment: Goals of care conversation Functional deficits secondary  to bilateral embolic shower, embolic pattern Recent NSTEMI Right Breast CA diagnosed 12/2022 s/p mastectomy Stage IIIa Lung Adenocarcinoma diagnosed 07/2023 s/p radiation SLE  Recommendations/Plan: Continue DNR/DNI Continue current care plan with hope for improvement Patient would not find it acceptable to be bedbound or nonverbal for a prolonged period of time. She would prefer comfort care in that situation Outpatient palliative care follow up for GOC discussions remains appropriate Psychosocial and emotional support provided PMT will continue to follow   Prognosis:  High risk for further decline and decompensation  Discharge Planning: Skilled Nursing Facility for rehab with Palliative care service follow-up  Care plan was discussed with Patient, patient's daughter, MD  Mickle SHAUNNA Fell, PA-C  Palliative Medicine Team Team phone # [phone]  Thank you for allowing the Palliative Medicine Team to assist in the care of this patient. Please utilize secure chat with additional questions, if there is no response within 30 minutes please call the above phone number.  Palliative Medicine Team providers are available by phone from 7am to 7pm daily and can be reached through the team cell phone.  Should this patient require assistance outside of these hours, please call the patient's attending physician.

## 2023-12-05 NOTE — Progress Notes (Signed)
   Subjective:    Patient ID: [name], female    DOB: 04/19/1940, 83 y.o.   MRN: [id number]  HPI no evidence of bleeding from the nose. Pack is in place.          Objective:   Physical Exam  Pack in place and no oozing or bleeding. No blood in mouth.       Assessment & Plan:   I think the epistaxis has resolved for nowe and pack can come out in few more days

## 2023-12-05 NOTE — Plan of Care (Signed)
  Problem: Consults Goal: RH STROKE PATIENT EDUCATION Description: See Patient Education module for education specifics  Outcome: Progressing   Problem: RH SAFETY Goal: RH STG ADHERE TO SAFETY PRECAUTIONS W/ASSISTANCE/DEVICE Description: STG Adhere to Safety Precautions With cues Assistance/Device. Outcome: Progressing   Problem: RH KNOWLEDGE DEFICIT Goal: RH STG INCREASE KNOWLEDGE OF DIABETES Description: Patient and dtr will be able to manage DM using educational resources for medications and dietary modification independently Outcome: Progressing Goal: RH STG INCREASE KNOWLEDGE OF HYPERTENSION Description: Patient and dtr will be able to manage HTN using educational resources for medications and dietary modification independently Outcome: Progressing Goal: RH STG INCREASE KNOWLEGDE OF HYPERLIPIDEMIA Description: Patient and dtr will be able to manage HLD using educational resources for medications and dietary modification independently Outcome: Progressing Goal: RH STG 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 Description: Patient and dtr will be able to manage secondary risks using educational resources for medications and dietary modification independently Outcome: Progressing   Problem: Education: Goal: Ability to describe self-care measures that may prevent or decrease complications (Diabetes Survival Skills Education) will improve Outcome: Progressing Goal: Individualized Educational Video(s) Outcome: Progressing   Problem: Coping: Goal: Ability to adjust to condition or change in health will improve Outcome: Progressing   Problem: Fluid Volume: Goal: Ability to maintain a balanced intake and output will improve Outcome: Progressing   Problem: Health Behavior/Discharge Planning: Goal: Ability to identify and utilize available resources and services will improve Outcome: Progressing Goal: Ability to manage health-related needs will improve Outcome: Progressing    Problem: Metabolic: Goal: Ability to maintain appropriate glucose levels will improve Outcome: Progressing   Problem: Nutritional: Goal: Maintenance of adequate nutrition will improve Outcome: Progressing Goal: Progress toward achieving an optimal weight will improve Outcome: Progressing   Problem: Skin Integrity: Goal: Risk for impaired skin integrity will decrease Outcome: Progressing   Problem: Tissue Perfusion: Goal: Adequacy of tissue perfusion will improve Outcome: Progressing

## 2023-12-06 DIAGNOSIS — D500 Iron deficiency anemia secondary to blood loss (chronic): Secondary | ICD-10-CM

## 2023-12-06 DIAGNOSIS — R630 Anorexia: Secondary | ICD-10-CM

## 2023-12-06 LAB — CBC
HCT: 23.5 % — ABNORMAL LOW (ref 36.0–46.0)
Hemoglobin: 7.8 g/dL — ABNORMAL LOW (ref 12.0–15.0)
MCH: 29.8 pg (ref 26.0–34.0)
MCHC: 33.2 g/dL (ref 30.0–36.0)
MCV: 89.7 fL (ref 80.0–100.0)
Platelets: 68 K/uL — ABNORMAL LOW (ref 150–400)
RBC: 2.62 MIL/uL — ABNORMAL LOW (ref 3.87–5.11)
RDW: 17.9 % — ABNORMAL HIGH (ref 11.5–15.5)
WBC: 7.5 K/uL (ref 4.0–10.5)
nRBC: 0 % (ref 0.0–0.2)

## 2023-12-06 LAB — BASIC METABOLIC PANEL WITH GFR
Anion gap: 11 (ref 5–15)
BUN: 22 mg/dL (ref 8–23)
CO2: 19 mmol/L — ABNORMAL LOW (ref 22–32)
Calcium: 8.8 mg/dL — ABNORMAL LOW (ref 8.9–10.3)
Chloride: 104 mmol/L (ref 98–111)
Creatinine, Ser: 0.87 mg/dL (ref 0.44–1.00)
GFR, Estimated: >60 mL/min (ref >60)
Glucose, Bld: 107 mg/dL — ABNORMAL HIGH (ref 70–99)
Potassium: 4 mmol/L (ref 3.5–5.1)
Sodium: 134 mmol/L — ABNORMAL LOW (ref 135–145)

## 2023-12-06 LAB — GLUCOSE, CAPILLARY
Glucose-Capillary: 106 mg/dL — ABNORMAL HIGH (ref 70–99)
Glucose-Capillary: 119 mg/dL — ABNORMAL HIGH (ref 70–99)
Glucose-Capillary: 122 mg/dL — ABNORMAL HIGH (ref 70–99)
Glucose-Capillary: 131 mg/dL — ABNORMAL HIGH (ref 70–99)

## 2023-12-06 MED ORDER — SODIUM CHLORIDE 0.9 % IV SOLN: INTRAVENOUS | Status: DC

## 2023-12-06 NOTE — H&P (View-Only) (Signed)
 Subjective: Patient states she is willing to undergo an EGD.   Objective: Vital signs in last 24 hours: Temp:  [98.4 F (36.9 C)-99.1 F (37.3 C)] 99.1 F (37.3 C) (11/10 0510) Pulse Rate:  [77-83] 77 (11/10 0510) Resp:  [18-20] 18 (11/10 0510) BP: (128-144)/(44-48) 132/44 (11/10 0510) SpO2:  [95 %-100 %] 99 % (11/10 0510) Weight change:  Last BM Date : 12/06/23  PE: Ill-appearing GENERAL: Prominent pallor  ABDOMEN: Soft, nondistended EXTREMITIES: No deformity  Lab Results: Results for orders placed or performed during the hospital encounter of 11/09/23 (from the past 48 hours)  Glucose, capillary     Status: Abnormal   Collection Time: 12/04/23 11:21 AM  Result Value Ref Range   Glucose-Capillary 66 (L) 70 - 99 mg/dL    Comment: Glucose reference range applies only to samples taken after fasting for at least 8 hours.  Glucose, capillary     Status: None   Collection Time: 12/04/23 11:59 AM  Result Value Ref Range   Glucose-Capillary 99 70 - 99 mg/dL    Comment: Glucose reference range applies only to samples taken after fasting for at least 8 hours.  Glucose, capillary     Status: Abnormal   Collection Time: 12/04/23  6:06 PM  Result Value Ref Range   Glucose-Capillary 101 (H) 70 - 99 mg/dL    Comment: Glucose reference range applies only to samples taken after fasting for at least 8 hours.  Glucose, capillary     Status: Abnormal   Collection Time: 12/04/23  9:06 PM  Result Value Ref Range   Glucose-Capillary 112 (H) 70 - 99 mg/dL    Comment: Glucose reference range applies only to samples taken after fasting for at least 8 hours.  Glucose, capillary     Status: Abnormal   Collection Time: 12/05/23  6:20 AM  Result Value Ref Range   Glucose-Capillary 100 (H) 70 - 99 mg/dL    Comment: Glucose reference range applies only to samples taken after fasting for at least 8 hours.  Glucose, capillary     Status: Abnormal   Collection Time: 12/05/23 11:31 AM  Result Value  Ref Range   Glucose-Capillary 106 (H) 70 - 99 mg/dL    Comment: Glucose reference range applies only to samples taken after fasting for at least 8 hours.  CBC with Differential/Platelet     Status: Abnormal   Collection Time: 12/05/23  2:57 PM  Result Value Ref Range   WBC 6.7 4.0 - 10.5 K/uL   RBC 2.72 (L) 3.87 - 5.11 MIL/uL   Hemoglobin 7.9 (L) 12.0 - 15.0 g/dL   HCT 75.2 (L) 63.9 - 53.9 %   MCV 90.8 80.0 - 100.0 fL   MCH 29.0 26.0 - 34.0 pg   MCHC 32.0 30.0 - 36.0 g/dL   RDW 81.8 (H) 88.4 - 84.4 %   Platelets 70 (L) 150 - 400 K/uL    Comment: CONSISTENT WITH PREVIOUS RESULT PLATELET COUNT CONFIRMED BY SMEAR REPEATED TO VERIFY Immature Platelet Fraction may be clinically indicated, consider ordering this additional test [id number]    nRBC 0.0 0.0 - 0.2 %   Neutrophils Relative % 72 %   Neutro Abs 4.8 1.7 - 7.7 K/uL   Lymphocytes Relative 17 %   Lymphs Abs 1.2 0.7 - 4.0 K/uL   Monocytes Relative 7 %   Monocytes Absolute 0.5 0.1 - 1.0 K/uL   Eosinophils Relative 3 %   Eosinophils Absolute 0.2 0.0 - 0.5 K/uL  Basophils Relative 0 %   Basophils Absolute 0.0 0.0 - 0.1 K/uL   Immature Granulocytes 1 %   Abs Immature Granulocytes 0.04 0.00 - 0.07 K/uL    Comment: Performed at [facility], [address], [place], [place] [address]  Glucose, capillary     Status: Abnormal   Collection Time: 12/05/23  4:23 PM  Result Value Ref Range   Glucose-Capillary 120 (H) 70 - 99 mg/dL    Comment: Glucose reference range applies only to samples taken after fasting for at least 8 hours.  Occult blood card to lab, stool     Status: Abnormal   Collection Time: 12/05/23  9:19 PM  Result Value Ref Range   Fecal Occult Bld POSITIVE (A) NEGATIVE    Comment: Performed at [facility], [address], [place], [place] [address]  Glucose, capillary     Status: Abnormal   Collection Time: 12/05/23  9:25 PM  Result Value Ref Range   Glucose-Capillary 122 (H) 70 - 99 mg/dL    Comment:  Glucose reference range applies only to samples taken after fasting for at least 8 hours.   Comment 1 Notify RN   Glucose, capillary     Status: Abnormal   Collection Time: 12/06/23  6:17 AM  Result Value Ref Range   Glucose-Capillary 106 (H) 70 - 99 mg/dL    Comment: Glucose reference range applies only to samples taken after fasting for at least 8 hours.   Comment 1 Notify RN     Studies/Results: No results found.  Medications: I have reviewed the patient's current medications.  Assessment: Dysphagia, barium swallow 11/23/2023 showed esophageal dysmotility, small sliding-type hiatal hernia Anemia, hemoglobin 7.9, normocytic, iron saturation 12%, ferritin 652, compatible with anemia of chronic disease Occult blood in stool Thrombocytopenia, platelets 70 Minimally elevated AST 51  Epistaxis: Status post ENT evaluation and nasal packing  Colonoscopy 02/11/2022: 2 tubular adenomas were removed from transverse colon, transverse colon diverticulosis, internal hemorrhoids  Comorbidities: Multiple acute infarcts, left-sided pulmonary adenocarcinoma, history of breast cancer status postmastectomy in March 2025, hypothyroidism, diabetes, SLE, interstitial lung disease, atrial fibrillation, Eliquis  on hold  Plan: Discussed with patient and her daughter [name] Griffiths over the phone, they want to proceed with diagnostic endoscopy, it has been scheduled for 8:30 AM tomorrow. I will keep the patient n.p.o. postmidnight. Continue pantoprazole  40 mg twice a day and sucralfate  suspension 1 g 4 times a day.  [name], MD 12/06/2023, 8:21 AM

## 2023-12-06 NOTE — Anesthesia Preprocedure Evaluation (Signed)
 Anesthesia Evaluation  Patient identified by MRN, date of birth, ID band Patient awake    Reviewed: Allergy & Precautions, NPO status , Patient's Chart, lab work & pertinent test results  Airway Mallampati: III  TM Distance: >3 FB Neck ROM: Full    Dental  (+) Edentulous Upper, Edentulous Lower   Pulmonary shortness of breath, pneumonia, COPD Lung mass   Pulmonary exam normal breath sounds clear to auscultation       Cardiovascular hypertension, Pt. on medications pulmonary hypertension+ CAD, + Past MI and +CHF  Normal cardiovascular exam+ dysrhythmias Atrial Fibrillation + Valvular Problems/Murmurs MR and AI  Rhythm:Regular Rate:Normal  Echo 10/2023  1. Left ventricular ejection fraction, by estimation, is 60 to 65%. The left  ventricle has normal function. The left ventricle has no regional wall motion abnormalities. There is moderate asymmetric left ventricular hypertrophy  of the septal and basal segments. Left ventricular diastolic parameters are indeterminate.   2. Right ventricular systolic function is normal. The right ventricular  size is normal. There is mildly elevated pulmonary artery systolic  pressure. The estimated right ventricular systolic pressure is 36.9 mmHg.   3. The mitral valve is degenerative. There is nodular thickening at the  leatlet tips and loose chordal structure as well. Mild mitral valve  regurgitation.   4. The aortic valve is tricuspid. Aortic valve regurgitation is moderate.   5. The inferior vena cava is normal in size with greater than 50%  respiratory variability, suggesting right atrial pressure of 3 mmHg.   Comparison(s): Prior images reviewed side by side. LVEF 60-65%. Mild  mitral regurgitation with degenerative valve. Aortic regurgitation is now  moderate - no obvious vegetation.     Hx/o [place]annie [place] endocarditis   Neuro/Psych  Headaches  Neuromuscular disease CVA  negative psych  ROS   GI/Hepatic ,GERD  Medicated,,(+)     substance abuse    Endo/Other  diabetes, Well Controlled, Type 2, Oral Hypoglycemic AgentsHypothyroidism  HLD  Renal/GU Renal disease     Musculoskeletal  (+) Arthritis , Osteoarthritis,    Abdominal   Peds  Hematology  (+) Blood dyscrasia, anemia , DOES NOT REFUSE BLOOD PRODUCTSEliquis therapy- last dose 5/31 SLE   Anesthesia Other Findings   Reproductive/Obstetrics                              Anesthesia Physical Anesthesia Plan  ASA: 4  Anesthesia Plan: MAC   Post-op Pain Management: Minimal or no pain anticipated   Induction: Intravenous  PONV Risk Score and Plan: 4 or greater and Treatment may vary due to age or medical condition, Propofol  infusion, Ondansetron  and TIVA  Airway Management Planned: Natural Airway  Additional Equipment: None  Intra-op Plan:   Post-operative Plan:   Informed Consent: I have reviewed the patients History and Physical, chart, labs and discussed the procedure including the risks, benefits and alternatives for the proposed anesthesia with the patient or authorized representative who has indicated his/her understanding and acceptance.   Patient has DNR.  Discussed DNR with patient, Discussed DNR with power of attorney and Suspend DNR.   Dental advisory given and Consent reviewed with POA  Plan Discussed with: [name]  Anesthesia Plan Comments: (PAT note by Lynwood [name], PA-C:  83 year old female follows with cardiology for history of HTN, HLD, paroxysmal a flutter, coronary calcium  on CT, systemic lupus.  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  She was treated with anticoagulation. Xarelto  was discontinued in 2022 through shared decision making due to echocardiogram revealing well-healed vegetation at the tip of the mitral valve.  Admitted  Sept 2024 with progressive SOB, intermittent fevers, found with PNA and Aflutter. Had spontaneous CV to SR, started on OAC.  Seen by Charlies Arthur, PA-C 03/01/2023 for preop evaluation prior to mastectomy.  Per note, Low RCRI score.  Reviewed with Dr. Ladona who knows her well.  No cardiac contraindication to mastectomy.  OK to old Eliquis  2 days pre-op to resume when safe to do so afterwards. She had subsequent followup with EP cardiologist Dr. Cindie to discuss ablation for atrial flutter; decided on conservative approach for now.  Follows with pulmonologist Dr. Geronimo for history of asthma, systemic lupus, ILD. Per notes, PFTs have been stable since 2019, ONO in October 2024 showed no desaturations.  Recent admission 5/24-5/24/25 after presenting with shortness of breath and weakness. Chest CT showed enlarging masslike density left lower lobe 3.1 x 3.3 cm. There was initial concern for PE, but finding on CTA was ultimately felt to be artifact. Pulmonology consulted and recommended outpatient bronchoscopy. She also completed a course of Augmentin  and steroids.   History of breast cancer in 2009 treated with lumpectomy and radiation with recent recurrence. She is s/p left mastectomy on 04/01/23. No chemo or radiation. She is on anastrozole .   Other pertinent history includes GERD (on omeprazole), hypothyroid, non-insulin -dependent DM2 (A1c 6.8 on 03/26/23).  CMP and CBC 06/20/23 reviewed, creatinine mildly elevated 1.19, mildly elevated WBC 12.9, mild anemia Hgb 11.0.  She will need DOS evaluation.   EKG 03/01/2023: NSR.  Rate 83.  Nonspecific ST-T changes.  CTA Chest 06/19/23: IMPRESSION: 1. Filling defect within segmental branch of the left lower lobe pulmonary artery is suspicious for pulmonary embolism. 2. Signs of chronic interstitial lung disease with bilateral peripheral, subpleural interstitial reticulation and honeycombing with a lower lung zone predominance. These findings appear  similar to the prior examination. 3. Bilateral heterogeneous ground-glass attenuation is noted within both lower lobes with mild consolidative changes noted in the superior segment of left lower lobe. Although nonspecific these findings may reflect superimposed inflammation or infection versus progressive changes of interstitial lung disease. 4. Increased size of mediastinal and bilateral hilar lymph nodes. These are favored to be reactive in etiology. 5. Enlarging masslike density within the left lower lobe which measures 3.1 x 3.3 cm. On the previous exam from 10/22/2022 this measured 2.8 x 2.1 cm. On the examination from 04/26/2022 this area measured 1.5 x 1.5 cm. Findings are concerning for enlarging neoplasm. Recommend referral to pulmonary medicine or thoracic surgery for further management. 6. Coronary artery calcifications. 7.  Aortic Atherosclerosis (ICD10-I70.0).  Critical Value/emergent results were called by telephone at the time of interpretation on 06/19/2023 at 8:34 am to provider Koren Arch, who verbally acknowledged these results.  PFTs 03/08/2023: FVC-%Pred-Pre % 62 FEV1-%Pred-Pre % 67 Pre FEV1/FVC ratio % 82 FEV1FVC-%Pred-Pre % 110 DLCO unc % pred % 44  TTE 10/12/2022: 1. Left ventricular ejection fraction, by estimation, is 60 to 65%. The  left ventricle has normal function. The left ventricle has no regional  wall motion abnormalities. There is mild left ventricular hypertrophy.  Left ventricular diastolic parameters  are consistent with Grade II diastolic dysfunction (pseudonormalization).  Elevated left atrial pressure.  2. Right ventricular systolic function is normal. The right ventricular  size is normal. There  is mildly elevated pulmonary artery systolic  pressure.  3. Left atrial size was mildly dilated.  4. Right atrial size was mildly dilated.  5. The mitral valve is normal in structure. Mild mitral valve  regurgitation. No evidence of mitral  stenosis.  6. The aortic valve is tricuspid. Aortic valve regurgitation is trivial.  Aortic valve sclerosis is present, with no evidence of aortic valve  stenosis.  7. The inferior vena cava is normal in size with greater than 50%  respiratory variability, suggesting right atrial pressure of 3 mmHg.   Lexiscan  myoview  stress test 07/03/2016: 1. The resting electrocardiogram demonstrated normal sinus rhythm, normal resting conduction, PVC and normal rest repolarization. Stress EKG is non-diagnostic for ischemia as it a pharmacologic stress using Lexiscan . Stress symptoms included dyspnea. 2. Myocardial perfusion imaging is normal. Overall left ventricular systolic function was normal without regional wall motion abnormalities. The left ventricular ejection fraction was 75%.  )         Anesthesia Quick Evaluation

## 2023-12-06 NOTE — Progress Notes (Signed)
 1                                                        PROGRESS NOTE    Subjective/Complaints: No new complaints or concerns this morning.  Appears per GI note patient was willing to undergo EGD and is scheduled for tomorrow at 8:30 AM.  ROS: Limited due to cognitive/behavioral  Denies chest pain or shortness of breath Objective:   No results found.     Recent Labs    12/05/23 1457 12/06/23 0831  WBC 6.7 7.5  HGB 7.9* 7.8*  HCT 24.7* 23.5*  PLT 70* 68*    Recent Labs    12/04/23 0526 12/06/23 0831  NA 132* 134*  K 3.9 4.0  CL 104 104  CO2 20* 19*  GLUCOSE 86 107*  BUN 19 22  CREATININE 0.88 0.87  CALCIUM  9.0 8.8*      Intake/Output Summary (Last 24 hours) at 12/06/2023 1629 Last data filed at 12/06/2023 1340 Gross per 24 hour  Intake 270 ml  Output --  Net 270 ml     Wound 12/02/23 1653 Pressure Injury Sacrum Mid Stage 2 -  Partial thickness loss of dermis presenting as a shallow open injury with a red, pink wound bed without slough. (Active)    Physical Exam: Vital Signs Blood pressure (!) 132/40, pulse 78, temperature 97.7 F (36.5 C), temperature source Oral, resp. rate 17, height 5' 6.5 (1.689 m), weight 55 kg, SpO2 99%. Constitutional: No distress . Vital signs reviewed. Alert, lying in bed appears comfortable  HEENT: NCAT, EOMI, oral membranes moist, nasal pack in place, no current bleeding noted Neck: supple Cardiovascular: RRR without murmur. No JVD    Respiratory/Chest: CTA Bilaterally without wheezes or rales. Normal effort    GI/Abdomen: BS +, non-tender, non-distended Ext: no clubbing, cyanosis, or edema Psych: pleasant and cooperative  Skin: warm PRIOR EXAMS: Neurological: Limited verbal output, selectively opens eyes and makes contact.  Decreased sensation to pain in right lower extremity.  Continued RUE and RLE weakness. Does not initiate movement on right  Tone: MAS 2 right shoulder, MAS 3 right elbow, MAS 0 wrists, fingers,  MAS 2 knee flexors, stable 11/10 Musculoskeletal: Mild tenderness left cervical paraspinal muscles,minimal pain L shoulder rom     Assessment/Plan: 1. Functional deficits which require 3+ hours per day of interdisciplinary therapy in a comprehensive inpatient rehab setting. Physiatrist is providing close team supervision and 24 hour management of active medical problems listed below. Physiatrist and rehab team continue to assess barriers to discharge/monitor patient progress toward functional and medical goals  Care Tool:  Bathing    Body parts bathed by patient: Right arm, Left arm, Chest, Abdomen, Front perineal area   Body parts bathed by helper: Abdomen, Chest, Front perineal area, Buttocks, Right upper leg, Left upper leg, Right lower leg, Left lower leg, Face     Bathing assist Assist Level: Total Assistance - Patient < 25%     Upper Body Dressing/Undressing Upper body dressing   What is the patient wearing?: Hospital gown only, Pull over shirt    Upper body assist Assist Level: Total Assistance - Patient < 25%    Lower Body Dressing/Undressing Lower body dressing      What is the patient wearing?: Underwear/pull up     Lower body  assist Assist for lower body dressing: Total Assistance - Patient < 25%     Toileting Toileting    Toileting assist Assist for toileting: Dependent - Patient 0%     Transfers Chair/bed transfer  Transfers assist  Chair/bed transfer activity did not occur: Safety/medical concerns (fatigue)  Chair/bed transfer assist level: Total Assistance - Patient < 25%     Locomotion Ambulation   Ambulation assist   Ambulation activity did not occur: Safety/medical concerns (weakness/fatigue/pain)          Walk 10 feet activity   Assist  Walk 10 feet activity did not occur: Safety/medical concerns        Walk 50 feet activity   Assist Walk 50 feet with 2 turns activity did not occur: Safety/medical concerns          Walk 150 feet activity   Assist Walk 150 feet activity did not occur: Safety/medical concerns         Walk 10 feet on uneven surface  activity   Assist Walk 10 feet on uneven surfaces activity did not occur: Safety/medical concerns         Wheelchair     Assist Is the patient using a wheelchair?: Yes Type of Wheelchair: Manual    Wheelchair assist level: Dependent - Patient 0%      Wheelchair 50 feet with 2 turns activity    Assist        Assist Level: Dependent - Patient 0%   Wheelchair 150 feet activity     Assist      Assist Level: Dependent - Patient 0%   Blood pressure (!) 132/40, pulse 78, temperature 97.7 F (36.5 C), temperature source Oral, resp. rate 17, height 5' 6.5 (1.689 m), weight 55 kg, SpO2 99%.  Medical Problem List and Plan: 1. Functional deficits secondary to  bilateral embolic shower, embolic pattern, etiology:  Afib and missed Eliquis  doses vs from NSTEMI with cardiac cath.              -patient may  shower             -ELOS/Goals: SNF pending?-              Continue CIR PT, OT and SLP as tolerated  - Ordered PRAFO/WHO right upper extremity, sling for right upper extremity also ordered  11/6-7-appreciate palliative care assistance. CT of head demonstrates no acute changes which would account for presentation.   -suspect a mood component    -pt is now DNR/DNI    -will need to pursue SNF vs hospice if no improvement over weekend-social work working with family on finding SNF bed   -consider neurology follow up to re-assess  Requested palliative care speak with family today to discuss goals of care   2.  Anemia: Eliquis  held due to dropping Hgb, GI consulted, patient told GI she did not want EGD but family says she wants EDG, see #1, will check CBC today to determine if Eliquis  can be restarted, clear to restart as per GI             -antiplatelet therapy: Asprin 81 mg   3. Pain Management: Gabapentin  decreased to HS  due to lethargy  -Voltaren  R shoulder for R shoulder pain  - 10/17 patient now reporting some pain in her left shoulder, suspect from increased use with therapy.  Right shoulder doing better.  Will try Voltaren  gel to left shoulder, consider imaging if does not improve  10/18 added kpad  for neck/shoulder as well  -10/22-23 Continue voltaren  gel for occasional shoulder pain  - Can try K-pad for neck as she has some soreness this morning  11-19-25 no complaints of pain  10/28, intermittent shoulder pain, had pain yesterday and had rapid response called yesterday to r/o cardiac event and had EKG/Trop, was found to have pain in trapezius muscle. Xray L shoulder, try lidocaine  patch  10/30 neck pain reported this AM, spoke with nursing early afternoon, pain improved with heatpack/repositioning and voltaren  gel  11/1 tramadol  prn for severe pain, C spine Xray, she just got tylenol  few min before I saw her  11/2 C spine xray with spondylosis, neck pain doing better today  11/3- Pt required tramadol  last night for shoulder pain- was better this AM  11/7-  using kpad, tramadol  4. Mood/Behavior/Sleep: LCSW to follow for evaluation and support when available.              -antipsychotic agents: N/A  -11/6 pt on lexapro for mood--change to hs  Continue ritalin to 10mg  to see if it helps arousal/mood  5. Neuropsych/cognition: This patient is not  capable of making decisions on her own behalf.  6. Skin/Wound Care: routine pressure relief measures.   7. Fluids/Electrolytes/Nutrition: monitor intake and output. Follow up labs in a.m.              - SLP consult: Carb modified diet + Ensure  -nutrition consult for decreased p.o. intake  10/18 intake remains minimal. RD following   -added megace for appetitte  10/19 obsv today. Recheck labs, prealbumin in AM   10/20 Prealbumin 21 this AM, continue to monitor PO intake  10/22 Hx of pain with swallowing, appears has been chronic for several, nursing reports  daughter said yesterday she was seen by GI in the past. 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 Continue aspirin  and statin.   9. CAD: 10/4-10/7 admitted for NSTEMI  10. HLD: Lipitor 40 mg  11. HTN: monitor blood pressures on  Hyzaar, Cardizem , and Aldactone      12/06/2023    2:00 PM 12/06/2023    5:10 AM 12/05/2023    9:00 PM  Vitals with BMI  Systolic 132 132 871  Diastolic 40 44 48  Pulse 78 77 83  -DBP consistently hypotensive:, decrease baclofen to BID prn - 11/10 systolic stable but diastolic remains hypotensive, continue to monitor for now 12. Paroxymal Atrial Flutter/Fib: continue Eliquis  2.5 bid  11/5- Regular rhythm this AM 13. T2DM: HgbA1c 6.0, resumed home Amaryl   - monitor cbg AC/HS with SSI.  CBG (last 3)  Recent Labs    12/05/23 2125 12/06/23 0617 12/06/23 1154  GLUCAP 122* 106* 119*  - 10/15 Will order CBG/SSI  - CBGs well-controlled, holding amaryl . But not eating much  -11/1 CBGs well controlled 11/7 generally well controlled due to lack of eating- con't to encourage pt to eat more. 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  Hypothyroidism: Synthroid  17.  Chronic anemia: Stable, monitor CBC.   -10/17 Hgb improved to 10 yesterday  -10/18 lower HGB at 8.4 suspect dilutional due to IVF, recheck tomorrow  - 10-26: Hemoglobin from 10-8.2.  May be dilutional due to IV fluids, will get iron studies and FOBT to evaluate --FOBT positive, hesitant to  hold [facility] given recent embolic strokes, will increase GI prophylaxis to 40 mg twice daily and defer AC discussion to primary team.  Trend hemoglobin. -10/27 stable overall at 8.2, appears to fluctuate, continue to monitor for now for reasons as above  -10/30 slightly lower at 7.9, will call GI Dr. Fayne- called left voicemail with my callback information -11/1 discussed with GI Dr. Fayne yesterday, continue current and monitor trend for now, call GI back if continues to decline -11/2 HGB 8.1 stable, continue tomitor 11/7   hgb has dropped further from  8.0, to 7.5 may be due to recent epistaxis, platelets sl less too, again   -will contact GI again for further recs. See #2  11/10 patient has EGD scheduled for tomorrow, hemoglobin 7.8.  Appreciate GI assistance 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  Continue to monitor  -10/20 NA 131 today, continue to monitor   -10/23 NA stable 131    10/30 NA 129, overall stable. Appears hyponatremia has been chronic for several years.   -10/31, Na stable overall, appears has been low for many years, discuss with nephrology Dr. Melia, continue to monitor as long in stable range, will check TSH/cortisol next labs  Recheck BMP/check TSH/Cortisol Monday. Hyponatremia has been chronic for years, dont think she needs fluid restrictions  11/6- Na 130- is stable  11/10 sodium up to 134 21. Azotemia  -10/20 IVF NS 50ml/hr  -10/23 BUN and CR improved, continue IVF at night for poor oral intake, can hold if drinks 1L  10-25: Creatinine increased to 1.02; increasing IV fluids as above  10-26: Creatinine 0.94, DC IV fluids due to SIADH as above.  -10/27 Cr/BUN  trending up as IVF stopped, recheck  tomorrow  -10/30 Bun 20, 0.93 Cr stable overall   Recheck tomorrow  11/7==BUN still at 23, eating nothing--continue  IVF 22.  Urinary incontinence/UTI  - 10-25 urinalysis mildly positive; start Keflex  500 mg twice daily for 7 days; follow sensitivities- Ecoli sensitive to cefazolin - continue current regimen  23.  Right hemiparesis/spasticity  - 10-26: Due to cognitive issues and lethargy, hesitant to schedule muscle relaxers.  Start baclofen 5 mg 3 times daily as needed.  -Continue PRN as above to avoid sedation  -Consider outpatient botox  24. Epistaxis  -plavix still on hold.  -rhino rocket dc'ed yesterday  *addendum 11/7 @1330 --recurrent bleeding. Rhino rocket replaced  Appreciate ENT following     LOS: 27 days A FACE TO FACE EVALUATION WAS PERFORMED  Zobel Collier 12/06/2023, 4:29 PM

## 2023-12-06 NOTE — Progress Notes (Signed)
 Physical Therapy Session Note  Patient Details  Name: [name] MRN: [id number] Date of Birth: 06/01/1940  Today's Date: 12/06/2023 PT Individual Time: 1116-1155 PT Individual Time Calculation (min): 39 min   Short Term Goals: Week 4:  PT Short Term Goal 1 (Week 4): STG=LTG due to ELOS  Skilled Therapeutic Interventions/Progress Updates:      Pt lying in bed with nursing at bedside for med pass. Pt mostly nonverbal during session, other than a few words. Remains lethargic and apathetic towards engaging in any meaningful forms of physical therapy.   Pt instructed in rolling at bed level, with +2 totalA for donning maxi-move sling. Pt transferred via lift from bed to TIS w/c with +2 dependent assist. Multiple blankets provided to stay warm and comfortable.   Pt transported off CIR unit, outdoors, for fresh air and change of scenery to help boost mood and well-being. Pt enjoyed sitting in the sun but continued to not speak during session. Attempted to engage in seated there-ex but limited to no effort made.   Returned upstairs to CIR floor and attempted the UE ergometer nustep at w/c level. Even with AAROM from PT, patient unable to tolerate.   Returned patient to her room and she was left in wheelchair with her needs met.   Therapy Documentation Precautions:  Precautions Precautions: Fall Recall of Precautions/Restrictions: Intact Precaution/Restrictions Comments: SBP <180, R hemiparesis Required Braces or Orthoses: Other Brace Other Brace: R LE PRAFO, R UE sling, R UE resting hand splint Restrictions Weight Bearing Restrictions Per Provider Order: No General:      Therapy/Group: Individual Therapy  [name] SHAUNNA Perks 12/06/2023, 7:48 AM

## 2023-12-06 NOTE — Progress Notes (Addendum)
 Patient ID: [name], female   DOB: Nov 16, 1940, 83 y.o.   MRN: [id number]  Patient's daughter asked if there were more facilities that have accepted. SW informed her that unfortunately no other facilities have made bed offers and she would have to choose from the list provided to her prior to asking - SW then explained the insurance auth process and informed her that it is not guaranteed due to patients condition.   Will follow-up.   UPDATE:   1023:   SW checked for any updated bed offers and there were none.   Provided an update of correspondences to [name]:  Patient was declined by all SNF preferences: [facility], [facility] declined. [facility] and [facility] have no bed offers.   UPDATE:  11:47   [name] informed SW that they want to shoot for [facility]. SW to start auth. [facility] accepted and awaiting a female bed to open.

## 2023-12-06 NOTE — Plan of Care (Signed)
  Problem: Consults Goal: RH STROKE PATIENT EDUCATION Description: See Patient Education module for education specifics  Outcome: Progressing   Problem: RH SAFETY Goal: RH STG ADHERE TO SAFETY PRECAUTIONS W/ASSISTANCE/DEVICE Description: STG Adhere to Safety Precautions With cues Assistance/Device. Outcome: Progressing   Problem: RH KNOWLEDGE DEFICIT Goal: RH STG INCREASE KNOWLEDGE OF DIABETES Description: Patient and dtr will be able to manage DM using educational resources for medications and dietary modification independently Outcome: Progressing Goal: RH STG INCREASE KNOWLEDGE OF HYPERTENSION Description: Patient and dtr will be able to manage HTN using educational resources for medications and dietary modification independently Outcome: Progressing Goal: RH STG INCREASE KNOWLEGDE OF HYPERLIPIDEMIA Description: Patient and dtr will be able to manage HLD using educational resources for medications and dietary modification independently Outcome: Progressing Goal: RH STG 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 Description: Patient and dtr will be able to manage secondary risks using educational resources for medications and dietary modification independently Outcome: Progressing   Problem: Education: Goal: Ability to describe self-care measures that may prevent or decrease complications (Diabetes Survival Skills Education) will improve Outcome: Progressing Goal: Individualized Educational Video(s) Outcome: Progressing   Problem: Coping: Goal: Ability to adjust to condition or change in health will improve Outcome: Progressing   Problem: Fluid Volume: Goal: Ability to maintain a balanced intake and output will improve Outcome: Progressing   Problem: Health Behavior/Discharge Planning: Goal: Ability to identify and utilize available resources and services will improve Outcome: Progressing Goal: Ability to manage health-related needs will improve Outcome: Progressing    Problem: Metabolic: Goal: Ability to maintain appropriate glucose levels will improve Outcome: Progressing   Problem: Nutritional: Goal: Maintenance of adequate nutrition will improve Outcome: Progressing Goal: Progress toward achieving an optimal weight will improve Outcome: Progressing   Problem: Skin Integrity: Goal: Risk for impaired skin integrity will decrease Outcome: Progressing   Problem: Tissue Perfusion: Goal: Adequacy of tissue perfusion will improve Outcome: Progressing

## 2023-12-06 NOTE — Progress Notes (Signed)
 Subjective: Patient states she is willing to undergo an EGD.   Objective: Vital signs in last 24 hours: Temp:  [98.4 F (36.9 C)-99.1 F (37.3 C)] 99.1 F (37.3 C) (11/10 0510) Pulse Rate:  [77-83] 77 (11/10 0510) Resp:  [18-20] 18 (11/10 0510) BP: (128-144)/(44-48) 132/44 (11/10 0510) SpO2:  [95 %-100 %] 99 % (11/10 0510) Weight change:  Last BM Date : 12/06/23  PE: Ill-appearing GENERAL: Prominent pallor  ABDOMEN: Soft, nondistended EXTREMITIES: No deformity  Lab Results: Results for orders placed or performed during the hospital encounter of 11/09/23 (from the past 48 hours)  Glucose, capillary     Status: Abnormal   Collection Time: 12/04/23 11:21 AM  Result Value Ref Range   Glucose-Capillary 66 (L) 70 - 99 mg/dL    Comment: Glucose reference range applies only to samples taken after fasting for at least 8 hours.  Glucose, capillary     Status: None   Collection Time: 12/04/23 11:59 AM  Result Value Ref Range   Glucose-Capillary 99 70 - 99 mg/dL    Comment: Glucose reference range applies only to samples taken after fasting for at least 8 hours.  Glucose, capillary     Status: Abnormal   Collection Time: 12/04/23  6:06 PM  Result Value Ref Range   Glucose-Capillary 101 (H) 70 - 99 mg/dL    Comment: Glucose reference range applies only to samples taken after fasting for at least 8 hours.  Glucose, capillary     Status: Abnormal   Collection Time: 12/04/23  9:06 PM  Result Value Ref Range   Glucose-Capillary 112 (H) 70 - 99 mg/dL    Comment: Glucose reference range applies only to samples taken after fasting for at least 8 hours.  Glucose, capillary     Status: Abnormal   Collection Time: 12/05/23  6:20 AM  Result Value Ref Range   Glucose-Capillary 100 (H) 70 - 99 mg/dL    Comment: Glucose reference range applies only to samples taken after fasting for at least 8 hours.  Glucose, capillary     Status: Abnormal   Collection Time: 12/05/23 11:31 AM  Result Value  Ref Range   Glucose-Capillary 106 (H) 70 - 99 mg/dL    Comment: Glucose reference range applies only to samples taken after fasting for at least 8 hours.  CBC with Differential/Platelet     Status: Abnormal   Collection Time: 12/05/23  2:57 PM  Result Value Ref Range   WBC 6.7 4.0 - 10.5 K/uL   RBC 2.72 (L) 3.87 - 5.11 MIL/uL   Hemoglobin 7.9 (L) 12.0 - 15.0 g/dL   HCT 75.2 (L) 63.9 - 53.9 %   MCV 90.8 80.0 - 100.0 fL   MCH 29.0 26.0 - 34.0 pg   MCHC 32.0 30.0 - 36.0 g/dL   RDW 81.8 (H) 88.4 - 84.4 %   Platelets 70 (L) 150 - 400 K/uL    Comment: CONSISTENT WITH PREVIOUS RESULT PLATELET COUNT CONFIRMED BY SMEAR REPEATED TO VERIFY Immature Platelet Fraction may be clinically indicated, consider ordering this additional test [id number]    nRBC 0.0 0.0 - 0.2 %   Neutrophils Relative % 72 %   Neutro Abs 4.8 1.7 - 7.7 K/uL   Lymphocytes Relative 17 %   Lymphs Abs 1.2 0.7 - 4.0 K/uL   Monocytes Relative 7 %   Monocytes Absolute 0.5 0.1 - 1.0 K/uL   Eosinophils Relative 3 %   Eosinophils Absolute 0.2 0.0 - 0.5 K/uL  Basophils Relative 0 %   Basophils Absolute 0.0 0.0 - 0.1 K/uL   Immature Granulocytes 1 %   Abs Immature Granulocytes 0.04 0.00 - 0.07 K/uL    Comment: Performed at [facility], [address], [place], [place] [address]  Glucose, capillary     Status: Abnormal   Collection Time: 12/05/23  4:23 PM  Result Value Ref Range   Glucose-Capillary 120 (H) 70 - 99 mg/dL    Comment: Glucose reference range applies only to samples taken after fasting for at least 8 hours.  Occult blood card to lab, stool     Status: Abnormal   Collection Time: 12/05/23  9:19 PM  Result Value Ref Range   Fecal Occult Bld POSITIVE (A) NEGATIVE    Comment: Performed at [facility], [address], [place], [place] [address]  Glucose, capillary     Status: Abnormal   Collection Time: 12/05/23  9:25 PM  Result Value Ref Range   Glucose-Capillary 122 (H) 70 - 99 mg/dL    Comment:  Glucose reference range applies only to samples taken after fasting for at least 8 hours.   Comment 1 Notify RN   Glucose, capillary     Status: Abnormal   Collection Time: 12/06/23  6:17 AM  Result Value Ref Range   Glucose-Capillary 106 (H) 70 - 99 mg/dL    Comment: Glucose reference range applies only to samples taken after fasting for at least 8 hours.   Comment 1 Notify RN     Studies/Results: No results found.  Medications: I have reviewed the patient's current medications.  Assessment: Dysphagia, barium swallow 11/23/2023 showed esophageal dysmotility, small sliding-type hiatal hernia Anemia, hemoglobin 7.9, normocytic, iron saturation 12%, ferritin 652, compatible with anemia of chronic disease Occult blood in stool Thrombocytopenia, platelets 70 Minimally elevated AST 51  Epistaxis: Status post ENT evaluation and nasal packing  Colonoscopy 02/11/2022: 2 tubular adenomas were removed from transverse colon, transverse colon diverticulosis, internal hemorrhoids  Comorbidities: Multiple acute infarcts, left-sided pulmonary adenocarcinoma, history of breast cancer status postmastectomy in March 2025, hypothyroidism, diabetes, SLE, interstitial lung disease, atrial fibrillation, Eliquis  on hold  Plan: Discussed with patient and her daughter [name] Griffiths over the phone, they want to proceed with diagnostic endoscopy, it has been scheduled for 8:30 AM tomorrow. I will keep the patient n.p.o. postmidnight. Continue pantoprazole  40 mg twice a day and sucralfate  suspension 1 g 4 times a day.  [name], MD 12/06/2023, 8:21 AM

## 2023-12-06 NOTE — Progress Notes (Signed)
 Occupational Therapy Session Note  Patient Details  Name: [name] MRN: [id number] Date of Birth: 05-10-1940  Today's Date: 12/06/2023 OT Individual Time: 9069-9045 OT Individual Time Calculation (min): 24 min    Short Term Goals: Week 2:  OT Short Term Goal 1 (Week 2): patient will demonstrate toilet transfers with maxA OT Short Term Goal 1 - Progress (Week 2): Progressing toward goal OT Short Term Goal 2 (Week 2): patient will complete LB donning with max A of 1 OT Short Term Goal 2 - Progress (Week 2): Progressing toward goal OT Short Term Goal 3 (Week 2): patient be able to complete static standing in prep for ADLS with max A OT Short Term Goal 3 - Progress (Week 2): Progressing toward goal Week 3:  OT Short Term Goal 1 (Week 3): patient will demonstrate toilet transfers with maxA OT Short Term Goal 1 - Progress (Week 3): Progressing toward goal OT Short Term Goal 2 (Week 3): patient will complete LB donning with max A of 1 OT Short Term Goal 2 - Progress (Week 3): Progressing toward goal OT Short Term Goal 3 (Week 3): patient be able to complete static standing in prep for ADLS with max A OT Short Term Goal 3 - Progress (Week 3): Progressing toward goal OT Short Term Goal 4 (Week 3): patient will complete toileting on BSC or toilet with max A OT Short Term Goal 4 - Progress (Week 3): Progressing toward goal  Skilled Therapeutic Interventions/Progress Updates:    Pt bed level at time of session, NT performing brief change bed level and OT assisting. Rolling L/R with total A, brief change dependent. Repositioned bed level dependently with RUE elevated. Face washing bed level with LUE and OT assisting for thoroughness. Self care task for drinking water using B hands with hand over hand and MAX facilitation of R hand to hold cup for PO intake. Bed level at end of session alarm on call bell in reach.   Therapy Documentation Precautions:  Precautions Precautions: Fall Recall of  Precautions/Restrictions: Intact Precaution/Restrictions Comments: SBP <180, R hemiparesis Required Braces or Orthoses: Other Brace Other Brace: R LE PRAFO, R UE sling, R UE resting hand splint Restrictions Weight Bearing Restrictions Per Provider Order: No    Therapy/Group: Individual Therapy  [name] 12/06/2023, 7:28 AM

## 2023-12-06 NOTE — Progress Notes (Signed)
 Nutrition Follow-up  DOCUMENTATION CODES:   Severe malnutrition in context of chronic illness  INTERVENTION:  Monitor ongoing GOC discussions and discharge plans Currently been worked up for SNF discharge pending bed openings  Monitor results/ recommendations following EGD tomorrow  Continue Ensure Plus High Protein po BID, each supplement provides 350 kcal and 20 grams of protein.  Continue feeding assistance and using dentures at meals  Encourage adequate intake that meets calorie and protein needs  Continue Magic cup TID with meals, each supplement provides 290 kcal and 9 grams of protein  NUTRITION DIAGNOSIS:   Severe Malnutrition related to chronic illness as evidenced by severe fat depletion, severe muscle depletion, percent weight loss, energy intake < or equal to 75% for > or equal to 1 month.  Remains applicable  GOAL:   Patient will meet greater than or equal to 90% of their needs, Weight gain  Currently unmet  MONITOR:   PO intake, Supplement acceptance, Weight trends  REASON FOR ASSESSMENT:   Consult Poor PO  ASSESSMENT:   PMH significant for DM2, recent dx lung CA undergoing radiation, breast CA s/p mastectomy, recent NSTEMI admission 10/4-10/7, Afib, CAD, HTN, interstitial lung disease, hypothyroidism. 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   Spoke with pt who was resting in bed at time of assessment. Pt continues to be lethargic and unable to participate meaningfully in rehab therapy sessions. Palliative following with ongoing GOC discussions regarding pt's care. Pt and family have agreed to proceed with diagnostic EGD, planned for tomorrow 11/11. Continued poor PO intake and intermittent refusal of Ensure supplements. Pt did drink 1 Ensure today but has otherwise refused the last 3 days. Pt opened eyes for brief moment during follow up and states she feels bad but could not expand  upon how when asked. SW working on discharge to SNF for pt as she is no longer able to participate in therapy appropriately. Will continue to monitor intake and disposition.   Average Meal Completion:  10/25-10/28 30% average intake x 8 recorded meals 10/31-11/2:16% average intake x 7 recorded meals 11/7-11/10: 20% average intake x 7 recorded meals   Medications: Ensure Plus BID  SSI 0-6 units TID Levothyroxine  Megace BID Ritalin BID Liquid MVI Protonix  Miralax  Sodium chloride  tablet BID  Labs: CBG x 24 h: 106-122 mg/dL Sodium 865<--869  Diet Order:   Diet Order             Diet NPO time specified Except for: Sips with Meds  Diet effective midnight           Diet NPO time specified  Diet effective midnight           DIET DYS 2 Fluid consistency: Thin  Diet effective now                   EDUCATION NEEDS:   Education needs have been addressed  Skin:  Skin Assessment: Reviewed RN Assessment  Last BM:  11/10 type 4  Height:   Ht Readings from Last 1 Encounters:  11/09/23 5' 6.5 (1.689 m)    Weight:   Wt Readings from Last 1 Encounters:  11/27/23 55 kg    Ideal Body Weight:  61.4 kg  BMI:  Body mass index is 19.28 kg/m.  Estimated Nutritional Needs:   Kcal:  1500-1700  Protein:  55-75g  Fluid:  1.5-1.7L    Josette Glance, MS, RDN, LDN Clinical Dietitian I Please reach out via  secure chat

## 2023-12-07 ENCOUNTER — Inpatient Hospital Stay (HOSPITAL_COMMUNITY): Payer: Self-pay | Admitting: Anesthesiology

## 2023-12-07 ENCOUNTER — Encounter (HOSPITAL_COMMUNITY): Payer: Self-pay | Admitting: Physical Medicine & Rehabilitation

## 2023-12-07 ENCOUNTER — Encounter (HOSPITAL_COMMUNITY): Payer: Self-pay | Admitting: Anesthesiology

## 2023-12-07 ENCOUNTER — Encounter (HOSPITAL_COMMUNITY)
Admission: AD | Disposition: A | Discharge status: Skilled Nursing Facility | Payer: Self-pay | Source: Intra-hospital | Attending: Physical Medicine & Rehabilitation

## 2023-12-07 DIAGNOSIS — I110 Hypertensive heart disease with heart failure: Secondary | ICD-10-CM

## 2023-12-07 DIAGNOSIS — I4891 Unspecified atrial fibrillation: Secondary | ICD-10-CM

## 2023-12-07 DIAGNOSIS — I2510 Atherosclerotic heart disease of native coronary artery without angina pectoris: Secondary | ICD-10-CM

## 2023-12-07 DIAGNOSIS — I5020 Unspecified systolic (congestive) heart failure: Secondary | ICD-10-CM

## 2023-12-07 DIAGNOSIS — K449 Diaphragmatic hernia without obstruction or gangrene: Secondary | ICD-10-CM

## 2023-12-07 HISTORY — PX: ESOPHAGOGASTRODUODENOSCOPY: SHX5428

## 2023-12-07 LAB — GLUCOSE, CAPILLARY
Glucose-Capillary: 85 mg/dL (ref 70–99)
Glucose-Capillary: 85 mg/dL (ref 70–99)
Glucose-Capillary: 70 mg/dL (ref 70–99)
Glucose-Capillary: 100 mg/dL — ABNORMAL HIGH (ref 70–99)
Glucose-Capillary: 121 mg/dL — ABNORMAL HIGH (ref 70–99)

## 2023-12-07 LAB — ABO/RH: ABO/RH(D): O POS

## 2023-12-07 LAB — TYPE AND SCREEN
ABO/RH(D): O POS
Antibody Screen: NEGATIVE

## 2023-12-07 SURGERY — EGD (ESOPHAGOGASTRODUODENOSCOPY)
Anesthesia: Monitor Anesthesia Care

## 2023-12-07 MED ORDER — NOREPINEPHRINE 4 MG/250ML-% IV SOLN
INTRAVENOUS | Status: AC
  Filled 2023-12-07: qty 250

## 2023-12-07 MED ORDER — MEDIHONEY WOUND/BURN DRESSING EX PSTE: 1.0000 | PASTE | Freq: Every day | CUTANEOUS | Status: DC

## 2023-12-07 MED ORDER — PROPOFOL 10 MG/ML IV BOLUS
INTRAVENOUS | Status: DC | PRN
  Administered 2023-12-07: 30 mg via INTRAVENOUS

## 2023-12-07 MED ORDER — CARMEX CLASSIC LIP BALM EX OINT: TOPICAL_OINTMENT | CUTANEOUS | Status: DC | PRN

## 2023-12-07 MED ORDER — PROPOFOL 500 MG/50ML IV EMUL
INTRAVENOUS | Status: DC | PRN
  Administered 2023-12-07: 100 ug/kg/min via INTRAVENOUS

## 2023-12-07 MED ORDER — LIDOCAINE HCL (CARDIAC) PF 100 MG/5ML IV SOSY
PREFILLED_SYRINGE | INTRAVENOUS | Status: DC | PRN
  Administered 2023-12-07: 20 mg via INTRAVENOUS

## 2023-12-07 MED ORDER — EPINEPHRINE HCL 5 MG/250ML IV SOLN IN NS
INTRAVENOUS | Status: AC
  Filled 2023-12-07: qty 250

## 2023-12-07 MED ORDER — COLLAGENASE 250 UNIT/GM EX OINT
TOPICAL_OINTMENT | Freq: Every day | CUTANEOUS | Status: DC
  Filled 2023-12-07: qty 30

## 2023-12-07 MED ORDER — SODIUM CHLORIDE 0.9 % IV SOLN: INTRAVENOUS | Status: AC

## 2023-12-07 MED ORDER — PHENYLEPHRINE 80 MCG/ML (10ML) SYRINGE FOR IV PUSH (FOR BLOOD PRESSURE SUPPORT)
PREFILLED_SYRINGE | INTRAVENOUS | Status: DC | PRN
  Administered 2023-12-07 (×2): 160 ug via INTRAVENOUS

## 2023-12-07 MED ORDER — SODIUM CHLORIDE 0.9 % IV SOLN
INTRAVENOUS | Status: AC | PRN
  Administered 2023-12-07: 500 mL via INTRAMUSCULAR

## 2023-12-07 NOTE — Interval H&P Note (Signed)
 History and Physical Interval Note: 83/female with dysphagia, anemia for EGD with possible balloon dilation with propofol .  12/07/2023 7:48 AM  [name]  has presented today for EGD with possible balloon dilation with propofol , with the diagnosis of dysphagia,anemia, occult blood in stool.  The various methods of treatment have been discussed with the patient and family. After consideration of risks, benefits and other options for treatment, the patient has consented to  Procedure(s): EGD (ESOPHAGOGASTRODUODENOSCOPY) (N/A) as a surgical intervention.  The patient's history has been reviewed, patient examined, no change in status, stable for surgery.  I have reviewed the patient's chart and labs.  Questions were answered to the patient's satisfaction.     [name]

## 2023-12-07 NOTE — Progress Notes (Addendum)
 1                                                        PROGRESS NOTE    Subjective/Complaints: EGD today with small hiatal hernia but no bleeding reported in esophagus, stomach or duodenum.  There was blood in her mouth noted, suspected from epistaxis.  GI restarting diet and signing off.  ROS: Limited due to cognitive/behavioral  Denies chest pain or shortness of breath.  Denies abdominal pain Objective:   No results found.     Recent Labs    12/05/23 1457 12/06/23 0831  WBC 6.7 7.5  HGB 7.9* 7.8*  HCT 24.7* 23.5*  PLT 70* 68*    Recent Labs    12/06/23 0831  NA 134*  K 4.0  CL 104  CO2 19*  GLUCOSE 107*  BUN 22  CREATININE 0.87  CALCIUM  8.8*      Intake/Output Summary (Last 24 hours) at 12/07/2023 1341 Last data filed at 12/07/2023 1303 Gross per 24 hour  Intake 665.58 ml  Output --  Net 665.58 ml     Wound 12/02/23 1653 Pressure Injury Sacrum Mid Stage 2 -  Partial thickness loss of dermis presenting as a shallow open injury with a red, pink wound bed without slough. (Active)    Physical Exam: Vital Signs Blood pressure (!) 132/39, pulse 79, temperature 97.7 F (36.5 C), resp. rate 18, height 5' 6.5 (1.689 m), weight 55 kg, SpO2 100%. Constitutional: No distress . Vital signs reviewed. Alert, lying in bed appears comfortable  HEENT: NCAT, EOMI, oral membranes moist, nasal pack in place, no current bleeding noted Neck: supple Cardiovascular: RRR without murmur. No JVD    Respiratory/Chest: CTA Bilaterally without wheezes or rales. Normal effort    GI/Abdomen: BS +, non-tender, non-distended Ext: no clubbing, cyanosis, or edema Psych: pleasant and cooperative  Skin: warm PRIOR EXAMS: Neurological: Limited verbal output, selectively opens eyes and makes contact.  Decreased sensation to pain in right lower extremity.  Continued RUE and RLE weakness. Does not initiate movement on right  Tone: MAS 2 right shoulder, MAS 3 right elbow, MAS 0  wrists, fingers, MAS 2 knee flexors, stable 11/10 Musculoskeletal: Mild tenderness left cervical paraspinal muscles,minimal pain L shoulder rom     Assessment/Plan: 1. Functional deficits which require 3+ hours per day of interdisciplinary therapy in a comprehensive inpatient rehab setting. Physiatrist is providing close team supervision and 24 hour management of active medical problems listed below. Physiatrist and rehab team continue to assess barriers to discharge/monitor patient progress toward functional and medical goals  Care Tool:  Bathing    Body parts bathed by patient: Right arm, Left arm, Chest, Abdomen, Front perineal area   Body parts bathed by helper: Abdomen, Chest, Front perineal area, Buttocks, Right upper leg, Left upper leg, Right lower leg, Left lower leg, Face     Bathing assist Assist Level: Total Assistance - Patient < 25%     Upper Body Dressing/Undressing Upper body dressing   What is the patient wearing?: Hospital gown only, Pull over shirt    Upper body assist Assist Level: Total Assistance - Patient < 25%    Lower Body Dressing/Undressing Lower body dressing      What is the patient wearing?: Underwear/pull up     Lower body assist Assist  for lower body dressing: Total Assistance - Patient < 25%     Toileting Toileting    Toileting assist Assist for toileting: Dependent - Patient 0%     Transfers Chair/bed transfer  Transfers assist  Chair/bed transfer activity did not occur: Safety/medical concerns (fatigue)  Chair/bed transfer assist level: Total Assistance - Patient < 25%     Locomotion Ambulation   Ambulation assist   Ambulation activity did not occur: Safety/medical concerns (weakness/fatigue/pain)          Walk 10 feet activity   Assist  Walk 10 feet activity did not occur: Safety/medical concerns        Walk 50 feet activity   Assist Walk 50 feet with 2 turns activity did not occur: Safety/medical  concerns         Walk 150 feet activity   Assist Walk 150 feet activity did not occur: Safety/medical concerns         Walk 10 feet on uneven surface  activity   Assist Walk 10 feet on uneven surfaces activity did not occur: Safety/medical concerns         Wheelchair     Assist Is the patient using a wheelchair?: Yes Type of Wheelchair: Manual    Wheelchair assist level: Dependent - Patient 0%      Wheelchair 50 feet with 2 turns activity    Assist        Assist Level: Dependent - Patient 0%   Wheelchair 150 feet activity     Assist      Assist Level: Dependent - Patient 0%   Blood pressure (!) 132/39, pulse 79, temperature 97.7 F (36.5 C), resp. rate 18, height 5' 6.5 (1.689 m), weight 55 kg, SpO2 100%.  Medical Problem List and Plan: 1. Functional deficits secondary to  bilateral embolic shower, embolic pattern, etiology:  Afib and missed Eliquis  doses vs from NSTEMI with cardiac cath.              -patient may  shower             -ELOS/Goals: SNF pending?-              Continue CIR PT, OT and SLP as tolerated  - Ordered PRAFO/WHO right upper extremity, sling for right upper extremity also ordered  11/6-7-appreciate palliative care assistance. CT of head demonstrates no acute changes which would account for presentation.   -suspect a mood component    -pt is now DNR/DNI    -will need to pursue SNF vs hospice if no improvement over weekend-social work working with family on finding SNF bed   -consider neurology follow up to re-assess  Requested palliative care speak with family today to discuss goals of care  Team conference tomorrow  - SLP session this morning with improved speech intelligibility compared to last week reported.   2.  Anemia: Eliquis  held due to dropping Hgb, GI consulted, patient told GI she did not want EGD but family says she wants EDG, see #1, will check CBC today to determine if Eliquis  can be restarted, clear to  restart as per GI             -antiplatelet therapy: Asprin 81 mg   3. Pain Management: Gabapentin  decreased to HS due to lethargy  -Voltaren  R shoulder for R shoulder pain  - 10/17 patient now reporting some pain in her left shoulder, suspect from increased use with therapy.  Right shoulder doing better.  Will try Voltaren   gel to left shoulder, consider imaging if does not improve  10/18 added kpad for neck/shoulder as well  -10/22-23 Continue voltaren  gel for occasional shoulder pain  - Can try K-pad for neck as she has some soreness this morning  11-19-25 no complaints of pain  10/28, intermittent shoulder pain, had pain yesterday and had rapid response called yesterday to r/o cardiac event and had EKG/Trop, was found to have pain in trapezius muscle. Xray L shoulder, try lidocaine  patch  10/30 neck pain reported this AM, spoke with nursing early afternoon, pain improved with heatpack/repositioning and voltaren  gel  11/1 tramadol  prn for severe pain, C spine Xray, she just got tylenol  few min before I saw her  11/2 C spine xray with spondylosis, neck pain doing better today  11/3- Pt required tramadol  last night for shoulder pain- was better this AM  11/7-  using kpad, tramadol  4. Mood/Behavior/Sleep: LCSW to follow for evaluation and support when available.              -antipsychotic agents: N/A  -11/6 pt on lexapro for mood--change to hs  Continue ritalin to 10mg  to see if it helps arousal/mood  5. Neuropsych/cognition: This patient is not  capable of making decisions on her own behalf.  6. Skin/Wound Care: routine pressure relief measures.   7. Fluids/Electrolytes/Nutrition: monitor intake and output. Follow up labs in a.m.              - SLP consult: Carb modified diet + Ensure  -nutrition consult for decreased p.o. intake  10/18 intake remains minimal. RD following   -added megace for appetitte  10/19 obsv today. Recheck labs, prealbumin in AM   10/20 Prealbumin 21 this AM,  continue to monitor PO intake  10/22 Hx of pain with swallowing, appears has been chronic for several, nursing reports daughter said yesterday she was seen by GI in the past. 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 Continue aspirin  and statin.   9. CAD: 10/4-10/7 admitted for NSTEMI  10. HLD: Lipitor 40 mg  11. HTN: monitor blood pressures on  Hyzaar, Cardizem , and Aldactone      12/07/2023    1:05 PM 12/07/2023    9:13 AM 12/07/2023    9:10 AM  Vitals with BMI  Systolic 132 124 880  Diastolic 39 40 42  Pulse 79 80 82  -DBP consistently hypotensive:, decrease baclofen to BID prn - 11/10-11 systolic stable but diastolic remains hypotensive, will start some gentle normal saline infusion 12. Paroxymal Atrial Flutter/Fib: continue Eliquis  2.5 bid  11/5- Regular rhythm this AM 13. T2DM: HgbA1c 6.0, resumed home Amaryl   - monitor cbg AC/HS with SSI.  CBG (last 3)  Recent Labs    12/07/23 0626 12/07/23 0859 12/07/23 1130  GLUCAP 85 85 70  - 10/15 Will order CBG/SSI  - CBGs well-controlled, holding amaryl . But not eating much  -11/1 CBGs well controlled 11/7 generally well controlled due to lack of eating- con't to encourage pt to eat more. 11/10 remains well-controlled 14 Right Breast  CA: Post mastectomy, on anastrozole   15. Stage IIIa Lung Adenocarcinoma: s/p radiation -follow up with Oncology  16.  Hypothyroidism: Synthroid  17.  Chronic anemia: Stable, monitor CBC.   -10/17 Hgb improved to 10 yesterday  -10/18 lower HGB at 8.4 suspect  dilutional due to IVF, recheck tomorrow  - 10-26: Hemoglobin from 10-8.2.  May be dilutional due to IV fluids, will get iron studies and FOBT to evaluate --FOBT positive, hesitant to hold [facility] given recent embolic strokes, will increase GI prophylaxis to 40 mg twice daily and defer AC discussion to primary team.  Trend hemoglobin. -10/27 stable overall at 8.2, appears to fluctuate, continue to monitor for now for reasons as above  -10/30 slightly lower at 7.9, will call GI Dr. Fayne- called left voicemail with my callback information -11/1 discussed with GI Dr. Fayne yesterday, continue current and monitor trend for now, call GI back if continues to decline -11/2 HGB 8.1 stable, continue tomitor 11/7   hgb has dropped further from  8.0, to 7.5 may be due to recent epistaxis, platelets sl less too, again   -will contact GI again for further recs. See #2  11/10 patient has EGD scheduled for tomorrow, hemoglobin 7.8.  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  Continue to monitor  -10/20 NA 131 today, continue to monitor   -10/23 NA stable 131    10/30 NA 129, overall stable. Appears hyponatremia has been chronic for several years.   -10/31, Na stable overall, appears has been low for many years, discuss with nephrology Dr. Melia, continue to monitor as long in stable range, will check TSH/cortisol next labs  Recheck BMP/check TSH/Cortisol Monday. Hyponatremia has been chronic for years, dont think she needs fluid restrictions  11/6- Na 130- is stable  11/10 sodium up to 134  Recheck tomorrow 21. Azotemia  -10/20 IVF NS 50ml/hr  -10/23 BUN and CR improved,  continue IVF at night for poor oral intake, can hold if drinks 1L  10-25: Creatinine increased to 1.02; increasing IV fluids as above  10-26: Creatinine 0.94, DC IV fluids due to SIADH as above.  -10/27 Cr/BUN  trending up as IVF stopped, recheck tomorrow  -10/30 Bun 20, 0.93 Cr stable overall   Recheck tomorrow  11/7==BUN still at 23, eating nothing--continue  IVF  11/11 start gentle IVF 50ml/hr as above, recheck tomorrow labs 22.  Urinary incontinence/UTI  - 10-25 urinalysis mildly positive; start Keflex  500 mg twice daily for 7 days; follow sensitivities- Ecoli sensitive to cefazolin - continue current regimen  23.  Right hemiparesis/spasticity  - 10-26: Due to cognitive issues and lethargy, hesitant to schedule muscle relaxers.  Start baclofen 5 mg 3 times daily as needed.  -Continue PRN as above to avoid sedation  -Consider outpatient botox  24. Epistaxis  -plavix still on hold.  -rhino rocket dc'ed yesterday  *addendum 11/7 @1330 --recurrent bleeding. Rhino rocket replaced  Appreciate ENT following   CBC tomorrow   LOS: 28 days A FACE TO FACE EVALUATION WAS PERFORMED  Leinweber Collier 12/07/2023, 1:41 PM

## 2023-12-07 NOTE — Progress Notes (Signed)
 Physical Therapy Session Note  Patient Details  Name: SAFIYA GIRDLER MRN: [id number] Date of Birth: [birth year]  Today's Date: 12/07/2023 PT Individual Time: 1300-1325 PT Individual Time Calculation (min): 25 min   Short Term Goals: Week 4:  PT Short Term Goal 1 (Week 4): STG=LTG due to ELOS  Skilled Therapeutic Interventions/Progress Updates:     Pt supine with HOB elevated upon arrival. Pt reported some pain in her buttock but agreeable to therapy. Session emphasized functional strengthening and endurance/activity tolerance with transfers and seated balance. Supine <> sitting EOB transfer max A (+2). Pt required max-total A (+2) at times to maintain seated balance to visit with daughters in room. PT provided verbal cueing for use of L UE to assist with maintaining upright position EOB, which she was able to do for very short periods of time with more mod A. Pt did a better job keeping her head up and eyes open today compared to when I saw her last week. Pt returned to supine, positioned for comfort with total A (+2), and all needs within reach at end of session. Spoke to pt's daughter who stated they decided on a SNF and are now waiting for a bed to open for the pt.  Therapy Documentation Precautions:  Precautions Precautions: Fall Recall of Precautions/Restrictions: Intact Precaution/Restrictions Comments: SBP <180, R hemiparesis Required Braces or Orthoses: Other Brace Other Brace: R LE PRAFO, R UE sling, R UE resting hand splint Restrictions Weight Bearing Restrictions Per Provider Order: No  Therapy/Group: Individual Therapy  [name], PT, DPT 12/07/2023, 3:54 PM

## 2023-12-07 NOTE — Plan of Care (Signed)
  Problem: Consults Goal: RH STROKE PATIENT EDUCATION Description: See Patient Education module for education specifics  Outcome: Progressing   Problem: RH SAFETY Goal: RH STG ADHERE TO SAFETY PRECAUTIONS W/ASSISTANCE/DEVICE Description: STG Adhere to Safety Precautions With cues Assistance/Device. Outcome: Progressing   Problem: RH KNOWLEDGE DEFICIT Goal: RH STG INCREASE KNOWLEDGE OF DIABETES Description: Patient and dtr will be able to manage DM using educational resources for medications and dietary modification independently Outcome: Progressing Goal: RH STG INCREASE KNOWLEDGE OF HYPERTENSION Description: Patient and dtr will be able to manage HTN using educational resources for medications and dietary modification independently Outcome: Progressing Goal: RH STG INCREASE KNOWLEGDE OF HYPERLIPIDEMIA Description: Patient and dtr will be able to manage HLD using educational resources for medications and dietary modification independently Outcome: Progressing Goal: RH STG 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 Description: Patient and dtr will be able to manage secondary risks using educational resources for medications and dietary modification independently Outcome: Progressing   Problem: Education: Goal: Ability to describe self-care measures that may prevent or decrease complications (Diabetes Survival Skills Education) will improve Outcome: Progressing Goal: Individualized Educational Video(s) Outcome: Progressing   Problem: Coping: Goal: Ability to adjust to condition or change in health will improve Outcome: Progressing   Problem: Fluid Volume: Goal: Ability to maintain a balanced intake and output will improve Outcome: Progressing   Problem: Health Behavior/Discharge Planning: Goal: Ability to identify and utilize available resources and services will improve Outcome: Progressing Goal: Ability to manage health-related needs will improve Outcome: Progressing    Problem: Metabolic: Goal: Ability to maintain appropriate glucose levels will improve Outcome: Progressing   Problem: Nutritional: Goal: Maintenance of adequate nutrition will improve Outcome: Progressing Goal: Progress toward achieving an optimal weight will improve Outcome: Progressing   Problem: Skin Integrity: Goal: Risk for impaired skin integrity will decrease Outcome: Progressing   Problem: Tissue Perfusion: Goal: Adequacy of tissue perfusion will improve Outcome: Progressing

## 2023-12-07 NOTE — Progress Notes (Signed)
 Occupational Therapy Session Note  Patient Details  Name: [name] MRN: [id number] Date of Birth: 02-02-1940  Today's Date: 12/07/2023 OT Individual Time: 8495-8466 OT Individual Time Calculation (min): 29 min    Short Term Goals: Week 4:  OT Short Term Goal 1 (Week 4): STG =LTG d/t ELOS  Skilled Therapeutic Interventions/Progress Updates:  Patient agreeable to participate in OT session. Reports no pain level.   Patient participated in skilled OT session focusing on functional mobility. Patient able to complete functional transfer to wc with max to total A. Patient bed mobility at max to total A. Patient then able to complete core strengthening via sitting up in wc without lateral lean. Patient demonstrated increased alertness today. Patient then able to complete transfer to bed with total A. Total x2 scooting to HOB. Patient and daughter needs met, all questions answered.    Therapy Documentation Precautions:  Precautions Precautions: Fall Recall of Precautions/Restrictions: Intact Precaution/Restrictions Comments: SBP <180, R hemiparesis Required Braces or Orthoses: Other Brace Other Brace: R LE PRAFO, R UE sling, R UE resting hand splint Restrictions Weight Bearing Restrictions Per Provider Order: No  Therapy/Group: Individual Therapy  D'mariea L Kason Benak 12/07/2023, 12:50 PM

## 2023-12-07 NOTE — Progress Notes (Signed)
 Speech Language Pathology Daily Session Note  Patient Details  Name: [name] MRN: [id number] Date of Birth: 09/11/1940  Today's Date: 12/07/2023 SLP Individual Time: 1230-1300 SLP Individual Time Calculation (min): 30 min  Short Term Goals: Week 4: SLP Short Term Goal 1 (Week 4): Patient will attend to functional therapy tasks for 1 min given mod assist. SLP Short Term Goal 2 (Week 4): Patient will solve basic functional problems with 20% accuracy given mod assist. SLP Short Term Goal 3 (Week 4): Patient will tolerate Dys2/thin liquid diet given mod assist for use of safe swallowing strategies. SLP Short Term Goal 4 (Week 4): Patient will utiize speech intelligibility strategies at the word level to achieve 50% intelligibility given mod assist.  Skilled Therapeutic Interventions: Skilled therapy session focused on dysphagia and communication goals. SLP facilitated session by observing patient during lunch tray consisting of D3/thin liquids. Patient with prolonged mastication, though complete oral clearance. Observed occasional throat clearing with solids, though patient with EGD this AM. No s/sx of aspiration during thin liquid trials. Recommend continuation of current diet with full supervision- only allowing patient to consume PO if she is completely alert. SLP targeted communication goals through orientation task and verbalization of wants/needs. Patient with seemingly improved speech intelligibility compared to last week, as she was able to clearly articulate reason for admission, current location, time and information about self. Patient required minA to increase vocal intensity to share wants/needs. Patient left in bed with alarm set and call bell in reach. Continue POC.   Pain Hip/buttocks pain - repositioned  Therapy/Group: Individual Therapy  Kashmere Staffa M.A., CCC-SLP 12/07/2023, 7:41 AM

## 2023-12-07 NOTE — Anesthesia Postprocedure Evaluation (Signed)
 Anesthesia Post Note  Patient: [name]  Procedure(s) Performed: EGD (ESOPHAGOGASTRODUODENOSCOPY)     Patient location during evaluation: PACU Anesthesia Type: MAC Level of consciousness: awake and alert Pain management: pain level controlled Vital Signs Assessment: post-procedure vital signs reviewed and stable Respiratory status: spontaneous breathing Cardiovascular status: stable Anesthetic complications: no   No notable events documented.  Last Vitals:  Vitals:   12/07/23 0910 12/07/23 0913  BP: (!) 119/42 (!) 124/40  Pulse: 82 80  Resp: (!) 21 20  Temp:    SpO2: 97% 97%    Last Pain:  Vitals:   12/07/23 0913  TempSrc:   PainSc: 0-No pain                 Norleen Pope

## 2023-12-07 NOTE — Transfer of Care (Signed)
 Immediate Anesthesia Transfer of Care Note  Patient: [name]  Procedure(s) Performed: EGD (ESOPHAGOGASTRODUODENOSCOPY)  Patient Location: PACU and Endoscopy Unit  Anesthesia Type:MAC  Level of Consciousness: sedated  Airway & Oxygen  Therapy: Patient Spontanous Breathing  Post-op Assessment: Report given to RN  Post vital signs: Reviewed and stable  Last Vitals:  Vitals Value Taken Time  BP    Temp    Pulse    Resp    SpO2      Last Pain:  Vitals:   12/07/23 0854  TempSrc: Temporal  PainSc: Asleep      Patients Stated Pain Goal: 0 (11/10/23 2000)  Complications: No notable events documented.

## 2023-12-07 NOTE — Op Note (Signed)
 [facility] Patient Name: [name] Procedure Date : 12/07/2023 MRN: [id number] Attending MD: [name] , MD, [id number] Date of Birth: 1940/04/26 CSN: [id number] Age: 83 Admit Type: Inpatient Procedure:                Upper GI endoscopy Indications:              Unexplained iron deficiency anemia, Dysphagia, Heme                            positive stool Providers:                [name], MD, Curtistine Bishop, Technician,                            Hoy Penner, RN Referring MD:             [facility] Physical Medicine and Rehabilitation Medicines:                Monitored Anesthesia Care Complications:            No immediate complications. Estimated Blood Loss:     Estimated blood loss: none. Procedure:                Pre-Anesthesia Assessment:                           - Prior to the procedure, a History and Physical                            was performed, and patient medications and                            allergies were reviewed. The patient's tolerance of                            previous anesthesia was also reviewed. The risks                            and benefits of the procedure and the sedation                            options and risks were discussed with the patient.                            All questions were answered, and informed consent                            was obtained. Prior Anticoagulants: The patient has                            taken Eliquis  (apixaban ), last dose was 6 days                            prior to procedure. ASA Grade Assessment: IV - A  patient with severe systemic disease that is a                            constant threat to life. After reviewing the risks                            and benefits, the patient was deemed in                            satisfactory condition to undergo the procedure.                           After obtaining informed consent, the endoscope was                             passed under direct vision. Throughout the                            procedure, the patient's blood pressure, pulse, and                            oxygen  saturations were monitored continuously. The                            [id number]-[id number] ([id number]) Olympus endoscope was introduced                            through the mouth, and advanced to the second part                            of duodenum. The upper GI endoscopy was                            accomplished without difficulty. The patient                            tolerated the procedure well. Scope In: Scope Out: Findings:      The examined esophagus was normal.      A 2 cm hiatal hernia was present.      The entire examined stomach was normal.      The cardia and gastric fundus were normal on retroflexion.      The examined duodenum was normal.      Small amount of blood was noted in the oral cavity, likely related to       epistaxis and nasal packing. Impression:               - Normal esophagus.                           - 2 cm hiatal hernia.                           - Normal stomach.                           -  Normal examined duodenum.                           - No specimens collected. Moderate Sedation:      Patient did not receive moderate sedation for this procedure, but       instead received monitored anesthesia care. Recommendation:           - Mechanical soft diet.                           - Continue present medications. Procedure Code(s):        --- Professional ---                           [id number], Esophagogastroduodenoscopy, flexible,                            transoral; diagnostic, including collection of                            specimen(s) by brushing or washing, when performed                            (separate procedure) Diagnosis Code(s):        --- Professional ---                           K44.9, Diaphragmatic hernia without obstruction or                             gangrene                           D50.9, Iron deficiency anemia, unspecified                           R13.10, Dysphagia, unspecified                           R19.5, Other fecal abnormalities CPT copyright 2022 [employer]. All rights reserved. The codes documented in this report are preliminary and upon coder review may  be revised to meet current compliance requirements. [name], MD 12/07/2023 8:52:54 AM This report has been signed electronically. Number of Addenda: 0

## 2023-12-08 ENCOUNTER — Encounter (HOSPITAL_COMMUNITY): Payer: Self-pay | Admitting: Gastroenterology

## 2023-12-08 DIAGNOSIS — D649 Anemia, unspecified: Secondary | ICD-10-CM

## 2023-12-08 LAB — BASIC METABOLIC PANEL WITH GFR
Anion gap: 9 (ref 5–15)
BUN: 19 mg/dL (ref 8–23)
CO2: 19 mmol/L — ABNORMAL LOW (ref 22–32)
Calcium: 8.4 mg/dL — ABNORMAL LOW (ref 8.9–10.3)
Chloride: 106 mmol/L (ref 98–111)
Creatinine, Ser: 0.85 mg/dL (ref 0.44–1.00)
GFR, Estimated: >60 mL/min (ref >60)
Glucose, Bld: 96 mg/dL (ref 70–99)
Potassium: 3.7 mmol/L (ref 3.5–5.1)
Sodium: 134 mmol/L — ABNORMAL LOW (ref 135–145)

## 2023-12-08 LAB — CBC
HCT: 22.4 % — ABNORMAL LOW (ref 36.0–46.0)
Hemoglobin: 7.5 g/dL — ABNORMAL LOW (ref 12.0–15.0)
MCH: 30.2 pg (ref 26.0–34.0)
MCHC: 33.5 g/dL (ref 30.0–36.0)
MCV: 90.3 fL (ref 80.0–100.0)
Platelets: 59 K/uL — ABNORMAL LOW (ref 150–400)
RBC: 2.48 MIL/uL — ABNORMAL LOW (ref 3.87–5.11)
RDW: 18.3 % — ABNORMAL HIGH (ref 11.5–15.5)
WBC: 7.1 K/uL (ref 4.0–10.5)
nRBC: 0 % (ref 0.0–0.2)

## 2023-12-08 LAB — GLUCOSE, CAPILLARY
Glucose-Capillary: 92 mg/dL (ref 70–99)
Glucose-Capillary: 106 mg/dL — ABNORMAL HIGH (ref 70–99)
Glucose-Capillary: 84 mg/dL (ref 70–99)
Glucose-Capillary: 117 mg/dL — ABNORMAL HIGH (ref 70–99)

## 2023-12-08 MED ORDER — NYSTATIN 100000 UNIT/ML MT SUSP: 5.0000 mL | Freq: Four times a day (QID) | OROMUCOSAL | Status: DC

## 2023-12-08 MED ORDER — COLLAGENASE 250 UNIT/GM EX OINT: TOPICAL_OINTMENT | Freq: Every day | CUTANEOUS | Status: DC

## 2023-12-08 MED ORDER — DICLOFENAC SODIUM 1 % EX GEL: 2.0000 g | Freq: Four times a day (QID) | CUTANEOUS | Status: DC

## 2023-12-08 MED ORDER — SALINE SPRAY 0.65 % NA SOLN: 1.0000 | NASAL | 0 refills | Status: DC | PRN

## 2023-12-08 MED ORDER — FERROUS GLUCONATE 324 (38 FE) MG PO TABS: 324.0000 mg | ORAL_TABLET | Freq: Every day | ORAL | Status: DC

## 2023-12-08 MED ORDER — MEGESTROL ACETATE 400 MG/10ML PO SUSP: 400.0000 mg | Freq: Two times a day (BID) | ORAL | Status: DC

## 2023-12-08 MED ORDER — MAGNESIUM HYDROXIDE 400 MG/5ML PO SUSP: 15.0000 mL | Freq: Every day | ORAL | Status: DC | PRN

## 2023-12-08 MED ORDER — PANTOPRAZOLE SODIUM 40 MG PO TBEC: 40.0000 mg | DELAYED_RELEASE_TABLET | Freq: Two times a day (BID) | ORAL | Status: DC

## 2023-12-08 MED ORDER — BACLOFEN 5 MG PO TABS: 5.0000 mg | ORAL_TABLET | Freq: Two times a day (BID) | ORAL | Status: DC | PRN

## 2023-12-08 MED ORDER — ESCITALOPRAM OXALATE 10 MG PO TABS: 10.0000 mg | ORAL_TABLET | Freq: Every day | ORAL | Status: DC

## 2023-12-08 MED ORDER — SODIUM CHLORIDE 1 G PO TABS: 1.0000 g | ORAL_TABLET | Freq: Two times a day (BID) | ORAL | Status: DC

## 2023-12-08 MED ORDER — SENNOSIDES-DOCUSATE SODIUM 8.6-50 MG PO TABS: 1.0000 | ORAL_TABLET | Freq: Every evening | ORAL | Status: DC | PRN

## 2023-12-08 MED ORDER — FERROUS GLUCONATE 324 (38 FE) MG PO TABS
324.0000 mg | ORAL_TABLET | Freq: Every day | ORAL | Status: DC
  Administered 2023-12-09: 324 mg via ORAL
  Filled 2023-12-08: qty 1

## 2023-12-08 MED ORDER — SALINE SPRAY 0.65 % NA SOLN
1.0000 | NASAL | Status: DC | PRN
  Filled 2023-12-08: qty 44

## 2023-12-08 MED ORDER — ACETAMINOPHEN 325 MG PO TABS: 325.0000 mg | ORAL_TABLET | ORAL | Status: DC | PRN

## 2023-12-08 MED ORDER — MEDIHONEY WOUND/BURN DRESSING EX PSTE
1.0000 | PASTE | Freq: Every day | CUTANEOUS | Status: DC
  Filled 2023-12-08: qty 44

## 2023-12-08 MED ORDER — LIDOCAINE 5 % EX PTCH: 1.0000 | MEDICATED_PATCH | CUTANEOUS | Status: DC

## 2023-12-08 MED ORDER — ENSURE PLUS HIGH PROTEIN PO LIQD: 237.0000 mL | Freq: Two times a day (BID) | ORAL | Status: DC

## 2023-12-08 MED ORDER — ADULT MULTIVITAMIN LIQUID CH: 15.0000 mL | Freq: Every day | ORAL | Status: DC

## 2023-12-08 MED ORDER — COLLAGENASE 250 UNIT/GM EX OINT
TOPICAL_OINTMENT | Freq: Every day | CUTANEOUS | Status: DC
  Filled 2023-12-08: qty 30

## 2023-12-08 MED ORDER — METHYLPHENIDATE HCL 10 MG PO TABS: 10.0000 mg | ORAL_TABLET | Freq: Two times a day (BID) | ORAL | 0 refills | Status: DC

## 2023-12-08 MED ORDER — TRAMADOL HCL 50 MG PO TABS: 50.0000 mg | ORAL_TABLET | Freq: Four times a day (QID) | ORAL | 0 refills | Status: DC | PRN

## 2023-12-08 MED ORDER — POLYETHYLENE GLYCOL 3350 17 G PO PACK: 17.0000 g | PACK | Freq: Every day | ORAL | Status: DC

## 2023-12-08 MED ORDER — MEDIHONEY WOUND/BURN DRESSING EX PSTE: 1.0000 | PASTE | Freq: Every day | CUTANEOUS | Status: DC

## 2023-12-08 MED ORDER — CARMEX CLASSIC LIP BALM EX OINT: TOPICAL_OINTMENT | CUTANEOUS | Status: DC | PRN

## 2023-12-08 MED ORDER — GABAPENTIN 100 MG PO CAPS: 100.0000 mg | ORAL_CAPSULE | Freq: Every day | ORAL | Status: DC

## 2023-12-08 NOTE — Progress Notes (Signed)
 Occupational Therapy Discharge Summary  Patient Details  Name: [name] MRN: [id number] Date of Birth: 1940-05-03  Date of Discharge from OT service:December 08, 2023   Patient has met 2 of 11 long term goals due to improved activity tolerance, postural control, and improved alertness although fluctuating levels.  Patient to discharge at overall Total Assist level.  Patient's care partner is independent to provide the necessary physical assistance at discharge. D/C to SNF    Reasons goals not met: patient demonstrating fluctuating levels with decreased alertness. Patient at overall max A, D/c to SNF for continued therapy   Recommendation:  Patient will benefit from ongoing skilled OT services in skilled nursing facility setting to continue to advance functional skills in the area of BADL and Reduce care partner burden.  Equipment: No equipment provided  Reasons for discharge: lack of progress toward goals  Patient/family agrees with progress made and goals achieved: Yes  OT Discharge Precautions/Restrictions  Precautions Precautions: Fall Recall of Precautions/Restrictions: Intact Precaution/Restrictions Comments: SBP <180, R hemiparesis Required Braces or Orthoses: Other Brace Other Brace: R LE PRAFO, R UE sling, R UE resting hand splint Restrictions Weight Bearing Restrictions Per Provider Order: No General   Vital Signs   Pain Pain Assessment Pain Scale: 0-10 Pain Score: 0-No pain ADL ADL Equipment Provided: Reacher Eating: Set up, Minimal assistance Grooming: Setup, Minimal assistance Where Assessed-Grooming: Sitting at sink, Wheelchair Upper Body Bathing: Maximal assistance Where Assessed-Upper Body Bathing: Bed level Lower Body Bathing: Dependent Where Assessed-Lower Body Bathing: Bed level Upper Body Dressing: Dependent, Maximal assistance Where Assessed-Upper Body Dressing: Sitting at sink Lower Body Dressing: Dependent Where Assessed-Lower Body  Dressing: Bed level Toileting: Dependent Where Assessed-Toileting: Bed level Toilet Transfer: Dependent, Maximal assistance Toilet Transfer Method: Squat pivot Toilet Transfer Equipment: Psychiatric Nurse: Unable to assess Vision Baseline Vision/History: 1 Wears glasses Patient Visual Report: No change from baseline Vision Assessment?: No apparent visual deficits;Wears glasses for reading Perception  Perception: Impaired Praxis Praxis: Impaired Praxis Impairment Details: Initiation;Motor planning Cognition Cognition Overall Cognitive Status: Impaired/Different from baseline Arousal/Alertness: Lethargic Memory: Impaired Memory Impairment: Decreased short term memory Decreased Short Term Memory: Verbal basic;Functional basic Attention: Sustained Brief Interview for Mental Status (BIMS) Repetition of Three Words (First Attempt): 3 Temporal Orientation: Year: Correct Temporal Orientation: Month: No answer Temporal Orientation: Day: No answer Recall: Sock: No, could not recall Recall: Blue: No, could not recall Recall: Bed: No, could not recall BIMS Summary Score: 6 Sensation Sensation Light Touch: Impaired by gross assessment Proprioception: Impaired by gross assessment Additional Comments: R hemiparesis Coordination Gross Motor Movements are Fluid and Coordinated: No Fine Motor Movements are Fluid and Coordinated: No Coordination and Movement Description: limited due to R hemiparesis Finger Nose Finger Test: limited by weakness Heel Shin Test: limited by weakness Motor  Motor Motor: Hemiplegia Motor - Discharge Observations: R hemiparesis/neglect/inattention Mobility  Bed Mobility Bed Mobility: Rolling Right;Rolling Left;Supine to Sit;Sit to Supine Rolling Right: Moderate Assistance - Patient 50-74%;Maximal Assistance - Patient 25-49% Rolling Left: Total Assistance - Patient < 25%;Maximal Assistance - Patient 25-49% Left Sidelying to Sit:  Total Assistance - Patient < 25%;Maximal Assistance - Patient 25-49% Supine to Sit: Total Assistance - Patient < 25%;Maximal Assistance - Patient - Patient 25-49% Sit to Supine: Total Assistance - Patient < 25%;Dependent - Patient equal 0% Transfers Sit to Stand: 2 Helpers;Dependent - Patient 0% Stand to Sit: 2 Helpers;Dependent - Patient equal 0%  Trunk/Postural Assessment  Cervical Assessment Cervical Assessment: Exceptions to [facility] (significantly forward  head) Thoracic Assessment Thoracic Assessment: Exceptions to [facility] (rounded shoulders fatigue) Lumbar Assessment Lumbar Assessment: Exceptions to [facility] Postural Control Postural Control: Deficits on evaluation Head Control: difficulty holding head up especially with fatigue Trunk Control: heavy lateral trunk lean to R Righting Reactions: delayed, requires L UE support  Balance Balance Balance Assessed: Yes Static Sitting Balance Static Sitting - Balance Support: Left upper extremity supported Static Sitting - Level of Assistance: 2: Max assist;1: +1 Total assist Dynamic Sitting Balance Dynamic Sitting - Balance Support: Left upper extremity supported Dynamic Sitting - Level of Assistance: 2: Max assist;1: +1 Total assist Dynamic Sitting - Balance Activities: Head control activities Extremity/Trunk Assessment RUE Assessment RUE Assessment: Exceptions to [facility] Active Range of Motion (AROM) Comments: no active range General Strength Comments: 0/5 RUE Body System: Neuro LUE Assessment LUE Assessment: Exceptions to [facility] Active Range of Motion (AROM) Comments: decreased AROM, able to reach mouth inconsistently General Strength Comments: 3/5   [name] 12/08/2023, 12:20 PM

## 2023-12-08 NOTE — Progress Notes (Signed)
 Patient ID: [name], female   DOB: 1940-09-23, 83 y.o.   MRN: [id number]  Have reviewed team conference with pt and family. Both aware and agreeable with targeted d/c date of 11/13 to [facility].   [facility] scheduled for 2:30pm.

## 2023-12-08 NOTE — Plan of Care (Signed)
  Problem: RH Eating Goal: LTG Patient will perform eating w/assist, cues/equip (OT) Description: LTG: Patient will perform eating with assist, with/without cues using equipment (OT) Outcome: Completed/Met   Problem: RH Grooming Goal: LTG Patient will perform grooming w/assist,cues/equip (OT) Description: LTG: Patient will perform grooming with assist, with/without cues using equipment (OT) Outcome: Completed/Met   Problem: RH Balance Goal: LTG: Patient will maintain dynamic sitting balance (OT) Description: LTG:  Patient will maintain dynamic sitting balance with assistance during activities of daily living (OT) Outcome: Not Met (add Reason)   Problem: Sit to Stand Goal: LTG:  Patient will perform sit to stand in prep for activites of daily living with assistance level (OT) Description: LTG:  Patient will perform sit to stand in prep for activites of daily living with assistance level (OT) Outcome: Not Met (add Reason)   Problem: RH Bathing Goal: LTG Patient will bathe all body parts with assist levels (OT) Description: LTG: Patient will bathe all body parts with assist levels (OT) Outcome: Not Met (add Reason)   Problem: RH Dressing Goal: LTG Patient will perform upper body dressing (OT) Description: LTG Patient will perform upper body dressing with assist, with/without cues (OT). Outcome: Not Met (add Reason) Goal: LTG Patient will perform lower body dressing w/assist (OT) Description: LTG: Patient will perform lower body dressing with assist, with/without cues in positioning using equipment (OT) Outcome: Not Met (add Reason)   Problem: RH Toileting Goal: LTG Patient will perform toileting task (3/3 steps) with assistance level (OT) Description: LTG: Patient will perform toileting task (3/3 steps) with assistance level (OT)  Outcome: Not Met (add Reason)   Problem: RH Functional Use of Upper Extremity Goal: LTG Patient will use RT/LT upper extremity as a (OT) Description:  LTG: Patient will use right/left upper extremity as a stabilizer/gross assist/diminished/nondominant/dominant level with assist, with/without cues during functional activity (OT) Outcome: Not Met (add Reason)   Problem: RH Toilet Transfers Goal: LTG Patient will perform toilet transfers w/assist (OT) Description: LTG: Patient will perform toilet transfers with assist, with/without cues using equipment (OT) Outcome: Not Met (add Reason)   Problem: RH Tub/Shower Transfers Goal: LTG Patient will perform tub/shower transfers w/assist (OT) Description: LTG: Patient will perform tub/shower transfers with assist, with/without cues using equipment (OT) Outcome: Not Met (add Reason)

## 2023-12-08 NOTE — Discharge Summary (Addendum)
 Physician Discharge Summary  Patient ID: ROIZY HAROLD MRN: [id number] DOB/AGE: 29-Jan-1940 83 y.o.  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 Automated exposure control, iterative reconstruction, and/or weight based adjustment of the mA/kV was utilized to reduce the radiation dose to as low as reasonably achievable. COMPARISON: 11/06/2023 CLINICAL HISTORY: Mental status change, persistent or worsening FINDINGS: BRAIN AND VENTRICLES: No acute hemorrhage. No evidence of acute infarct. Nonspecific periventricular and subcortical white matter hypoattenuation, consistent with chronic microvascular ischemic changes. Prominent ventricles suggesting underlying parenchymal volume loss. No extra-axial collection. No mass effect or midline shift. Atherosclerotic calcifications of carotid siphons. ORBITS: No acute abnormality. SINUSES: Mucosal  thickening in right maxillary sinus and left sphenoid sinus. Frothy material in left sphenoid sinus. SOFT TISSUES AND SKULL: No acute soft tissue abnormality. No skull fracture. IMPRESSION: 1. No acute intracranial abnormality. 2. Prominent ventricles suggesting underlying parenchymal volume loss. 3. Nonspecific periventricular and subcortical white matter hypoattenuation consistent with chronic microvascular ischemic changes. 4. Paranasal sinus disease involving the right maxillary and left sphenoid sinuses, with frothy material in the left sphenoid sinus. Electronically signed by: Lonni Necessary MD 12/02/2023 06:58 PM EST RP Workstation: [id number]   DG Cervical Spine 2 or 3 views Result Date: 11/27/2023 CLINICAL DATA:  Left neck pain EXAM: CERVICAL SPINE - 2 VIEW COMPARISON:  CT cervical spine dated 06/19/2023 FINDINGS: There is no evidence of cervical spine fracture or prevertebral soft tissue swelling. Straightening of the cervical lordosis. Again seen are multilevel degenerative changes of the cervical spine characterized by intervertebral disc space narrowing and disc osteophyte formation. IMPRESSION: 1. No acute fracture or traumatic listhesis. 2. Unchanged multilevel degenerative changes of the cervical spine. Electronically Signed   By: Limin  Xu M.D.   On: 11/27/2023 15:12   DG Shoulder Left Result Date: 11/24/2023 CLINICAL DATA:  Pain. EXAM: LEFT SHOULDER - 2+ VIEW COMPARISON:  None Available. FINDINGS: There is no evidence of fracture or dislocation. Mild acromioclavicular degenerative spurring. Subcortical cystic change in the lateral humeral head. No erosion or bony destructive change. Soft tissues are unremarkable. IMPRESSION: 1. Mild acromioclavicular degenerative change. 2. Subcortical cystic change in the lateral humeral head, can be seen with rotator cuff arthropathy. Electronically Signed   By: Andrea Gasman M.D.   On: 11/24/2023 15:02   DG ESOPHAGUS W SINGLE CM (SOL OR THIN  BA) Result Date: 11/23/2023 CLINICAL DATA:  Inpatient being treated for recent CVA with c/o dysphagia. Exam limited by mobility and some views limited by kyphotic posture. EXAM:  ESOPHAGUS/BARIUM SWALLOW/TABLET STUDY TECHNIQUE: Single contrast examination was performed using thin liquid barium. This exam was performed by Laymon Coast, NP, and was supervised and interpreted by Dr. Johann. Images obtained in LPO and RPO positioning. FLUOROSCOPY: Radiation Exposure Index (as provided by the fluoroscopic device): 15 mGy Kerma COMPARISON:  None Available. FINDINGS: Swallowing: Appears normal. No vestibular penetration or aspiration seen. Pharynx: Unremarkable. Esophagus: Normal appearance. Esophageal motility: Esophageal dysmotility seen as tertiary contractions resulting in contrast stasis within the mid and distal esophagus. Hiatal Hernia: Small sliding type hiatal hernia seen. Gastroesophageal reflux: None visualized. Ingested 13 mm barium tablet: Passed without significant delay to the stomach. Other: None. IMPRESSION: 1.  Esophageal dysmotility. 2.  Small sliding type hiatal hernia. Electronically Signed   By: JONETTA Johann M.D.   On: 11/23/2023 12:17   DG Swallowing Func-Speech Pathology Result Date: 11/23/2023 Table formatting from the original result was not included. Modified Barium Swallow Study Patient Details Name: JOANNE SALAH MRN: [id number] Date of Birth: Jan 31, 1940 Today's Date: 11/23/2023 HPI/PMH: HPI: LAURYN LIZARDI is a 83 y.o. 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       CT of the head revealed large hypodensity on the right concerning for large territory infarct.  MRI of the brain revealed multiple acute infarcts in bilateral watershed distribution involving the convexities,  inferior right frontal lobe, right caudate head, posterior parietal and occipital lobes, right lateral pons and posterior cerebellum.  Stroke consistent with embolic.  Patient apparently missed Eliquis  doses. Echo shows EF 60%, increased RVSP at 36.9 mmHg, mild MR, moderate AR. Continue aspirin , statin.  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 S/p radiation, on anastrozole .She is at risk for strokes due to her cancer history. Therapies are recommending intensive rehab. Clinical Impression: Patient presents with a functional oropharyngeal swallow. Oral phase is remarkable for mild oral residuals and mildly prolonged mastication times (patient with missing dentition) however patient is able to clear w/ additional swallows or liquid wash. Pharyngeal phase is remarkable for complete epiglottic inversion, laryngeal vestibular closure and hyoid excursion. Noted x1 instance of trace penetration, however no other instances of penetration/aspiration across all consistencies tested. Full clearance of pharyngeal space. Patient unable to transit pill from oral cavity, therefore expectorated. Recommend continuation of D3/thin liquid diet with medications crushed in puree. Patient should receive physical and verbal assistance during meals to cue for head up posture. Factors that may increase risk of adverse event in presence of aspiration Noe & Lianne 2021): No data recorded Recommendations/Plan: Swallowing Evaluation Recommendations Swallowing Evaluation Recommendations Recommendations: PO diet PO Diet Recommendation: Dysphagia 3 (Mechanical soft); Thin liquids (Level 0) Liquid Administration via: Straw Medication Administration: Crushed with puree Supervision: Full assist for feeding Swallowing strategies  : Slow rate; Small bites/sips Postural changes: Position pt fully upright for meals Oral care recommendations: Oral care BID (2x/day) Treatment Plan Treatment Plan Treatment  recommendations: Therapy as outlined in treatment plan below Follow-up recommendations: Follow physicians's recommendations for discharge plan and follow up therapies Functional status assessment: Patient has had a recent decline in their functional status and demonstrates the ability to make significant improvements in function in a reasonable and predictable amount of time. Treatment frequency: Min 2x/week Treatment duration: 2 weeks Interventions: Compensatory techniques; Patient/family education; Diet toleration management by SLP Recommendations Recommendations for follow up therapy  are one component of a multi-disciplinary discharge planning process, led by the attending physician.  Recommendations may be updated based on patient status, additional functional criteria and insurance authorization. Assessment: Orofacial Exam: Orofacial Exam Oral Cavity - Dentition: Edentulous Orofacial Anatomy: WFL Anatomy: Anatomy: WFL Boluses Administered: Boluses Administered Boluses Administered: Thin liquids (Level 0); Mildly thick liquids (Level 2, nectar thick); Puree; Solid  Oral Impairment Domain: Oral Impairment Domain Lip Closure: Interlabial escape, no progression to anterior lip Tongue control during bolus hold: Cohesive bolus between tongue to palatal seal Bolus preparation/mastication: Slow prolonged chewing/mashing with complete recollection Bolus transport/lingual motion: Brisk tongue motion Oral residue: Trace residue lining oral structures Location of oral residue : Tongue Initiation of pharyngeal swallow : Posterior laryngeal surface of the epiglottis  Pharyngeal Impairment Domain: Pharyngeal Impairment Domain Soft palate elevation: No bolus between soft palate (SP)/pharyngeal wall (PW) Laryngeal elevation: Complete superior movement of thyroid  cartilage with complete approximation of arytenoids to epiglottic petiole Anterior hyoid excursion: Complete anterior movement Epiglottic movement: Complete inversion  Laryngeal vestibule closure: Complete, no air/contrast in laryngeal vestibule Pharyngeal stripping wave : Present - complete Pharyngeal contraction (A/P view only): N/A Pharyngoesophageal segment opening: Complete distension and complete duration, no obstruction of flow Tongue base retraction: No contrast between tongue base and posterior pharyngeal wall (PPW) Pharyngeal residue: Trace residue within or on pharyngeal structures  Esophageal Impairment Domain: Esophageal Impairment Domain Esophageal clearance upright position: Complete clearance, esophageal coating Pill: Pill Consistency administered: -- (expectorated) Penetration/Aspiration Scale Score: Penetration/Aspiration Scale Score 1.  Material does not enter airway: Puree; Solid; Mildly thick liquids (Level 2, nectar thick) 2.  Material enters airway, remains ABOVE vocal cords then ejected out: Thin liquids (Level 0) Compensatory Strategies: Compensatory Strategies Compensatory strategies: Yes Straw: Effective Effective Straw: Thin liquid (Level 0); Mildly thick liquid (Level 2, nectar thick)   General Information: No data recorded Diet Prior to this Study: Dysphagia 3 (mechanical soft); Thin liquids (Level 0)   No data recorded  Respiratory Status: WFL   Supplemental O2: None (Room air)   No data recorded Behavior/Cognition: Alert; Lethargic/Drowsy Self-Feeding Abilities: Able to self-feed Baseline vocal quality/speech: Normal Volitional Cough: Able to elicit Volitional Swallow: Able to elicit Exam Limitations: Poor positioning Goal Planning: Prognosis for improved oropharyngeal function: Good No data recorded No data recorded No data recorded Consulted and agree with results and recommendations: Patient; Nurse Pain: No data recorded End of Session: Start Time:No data recorded Stop Time: No data recorded Time Calculation:No data recorded Charges: No data recorded SLP visit diagnosis: SLP Visit Diagnosis: Dysphagia, unspecified (R13.10) Past Medical History:  Past Medical History: Diagnosis Date  Acute bronchitis 12/17/2021  Acute cystitis 02/20/2019  Arthritis   Atrial flutter (HCC)   Breast cancer (HCC)   left  2009 and 2025  Diabetes mellitus without complication (HCC)   Dyspnea   mostly with exertion  GERD (gastroesophageal reflux disease)   History of radiation therapy   Left Lung-08/09/23-10/01/23- Dr. 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[facility])   Lupus   Pneumonia 07/31/2021 Past Surgical History: Past Surgical History: Procedure Laterality Date  BREAST BIOPSY Left 01/15/2023  US  LT BREAST BX W LOC DEV 1ST LESION IMG BX SPEC US  GUIDE 01/15/2023 [facility] MAMMOGRAPHY  BREAST LUMPECTOMY Left 2009  BRONCHIAL BIOPSY  06/29/2023  Procedure: BRONCHOSCOPY, WITH BIOPSY;  Surgeon: [name], MD;  Location: [facility] ENDOSCOPY;  Service: Pulmonary;;  BRONCHIAL BRUSHINGS  06/29/2023  Procedure: BRONCHOSCOPY, WITH BRUSH BIOPSY;  Surgeon: [name],  MD;  Location: [facility] ENDOSCOPY;  Service: Pulmonary;;  BRONCHIAL NEEDLE ASPIRATION BIOPSY  06/29/2023  Procedure: BRONCHOSCOPY, WITH NEEDLE ASPIRATION BIOPSY;  Surgeon: [name], MD;  Location: [facility] ENDOSCOPY;  Service: Pulmonary;;  IR 3D INDEPENDENT WKST  05/29/2021  IR ANGIO INTRA EXTRACRAN SEL COM CAROTID INNOMINATE BILAT MOD SED  05/29/2021  IR ANGIO VERTEBRAL SEL VERTEBRAL BILAT MOD SED  05/29/2021  LEFT HEART CATH AND CORONARY ANGIOGRAPHY N/A 11/01/2023  Procedure: LEFT HEART CATH AND CORONARY ANGIOGRAPHY;  Surgeon: Wonda Sharper, MD;  Location: [facility] INVASIVE CV LAB;  Service: Cardiovascular;  Laterality: N/A;  SIMPLE MASTECTOMY WITH AXILLARY SENTINEL NODE BIOPSY Left 03/31/2023  Procedure: LEFT MASTECTOMY;  Surgeon: Vernetta Berg, MD;  Location: [facility] OR;  Service: General;  Laterality: Left;  LMA PEC BLOCK  VIDEO BRONCHOSCOPY WITH ENDOBRONCHIAL NAVIGATION Left 06/29/2023  Procedure: VIDEO BRONCHOSCOPY WITH ENDOBRONCHIAL NAVIGATION;  Surgeon: [name], MD;  Location: [facility] ENDOSCOPY;  Service:  Pulmonary;  Laterality: Left;  VIDEO BRONCHOSCOPY WITH ENDOBRONCHIAL ULTRASOUND Bilateral 06/29/2023  Procedure: BRONCHOSCOPY, WITH EBUS;  Surgeon: [name], MD;  Location: [facility] ENDOSCOPY;  Service: Pulmonary;  Laterality: Bilateral; Blaise FALCON Sockwell 11/23/2023, 10:22 AM  DG Chest 2 View Result Date: 11/19/2023 CLINICAL DATA:  Cough. EXAM: DG CHEST 2V COMPARISON:  Radiograph 10/30/2023, CT 06/19/2023 FINDINGS: Upper normal heart size. Stable mediastinal contours with aortic atherosclerosis. Scattered areas of parenchymal scarring. Peripheral left lower lobe opacity may be related to treatment related changes, lung nodule on prior CT. Small left pleural effusion. No pulmonary edema. No pneumothorax. IMPRESSION: Peripheral left lower lobe opacity may be related to treatment related changes, lung nodule on prior CT. Small left pleural effusion. Electronically Signed   By: Andrea Gasman M.D.   On: 11/19/2023 20:15    Labs:  Basic Metabolic Panel: Recent Labs  Lab 12/03/23 0527 12/04/23 0526 12/06/23 0831 12/08/23 0430 12/09/23 0517  NA 131* 132* 134* 134* 131*  K 4.2 3.9 4.0 3.7 3.5  CL 103 104 104 106 107  CO2 20* 20* 19* 19* 17*  GLUCOSE 120* 86 107* 96 95  BUN 23 19 22 19 13   CREATININE 0.93 0.88 0.87 0.85 0.75  CALCIUM  9.1 9.0 8.8* 8.4* 8.3*    CBC: Recent Labs  Lab 12/05/23 1457 12/06/23 0831 12/08/23 0430 12/09/23 0517  WBC 6.7 7.5 7.1 7.2  NEUTROABS 4.8  --   --   --   HGB 7.9* 7.8* 7.5* 7.3*  HCT 24.7* 23.5* 22.4* 21.8*  MCV 90.8 89.7 90.3 90.1  PLT 70* 68* 59* 58*    CBG: Recent Labs  Lab 12/08/23 0535 12/08/23 1201 12/08/23 1646 12/08/23 2124 12/09/23 0828  GLUCAP 92 106* 84 117* 95    Brief HPI:   JAKYIA GACCIONE is a 83 y.o. female with PMHx of A.fib on Eliquis , recent dx of adenocarcinoma of lung undergoing radiation treatment, hx breast cancer, recent admission for NSTEMI with right heart cath (10/4-10/7) presented to [facility] on November 06, 2023, with symptoms of altered mental status, slurred speech, and right-sided weakness. Initial CT imaging of the head indicated a large hypodensity on the right side, suggestive of a large territory infarct. 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 There were also bilateral embolic watershed infarcts and bilateral cerebellum infarcts identified. Neurology assessed the stroke to be potentially related to  atrial fibrillation with missed doses of Eliquis  or the recent NSTEMI with cardiac catheterization. An MRA of the head and neck was negative for large vessel occlusion and showed no significant stenosis. Prior to her hospital admission, Britta was limited in the community, using a rollator walker more frequently over the past week and requiring assistance with mobility and functional cognition. Currently, she needs minimal to maximal assistance with activities of daily living (ADLs) and general mobility, and she transfers using moderate assistance. The patient resides with her family in a one-level home with a single step to enter. Therapy evaluations completed due to patient decreased functional mobility was admitted for a comprehensive rehab program.     Inpatient Rehabilitation Course: KALLIOPE RIESEN was admitted to rehab 11/09/2023 for inpatient therapies to consist of PT, ST and OT at least three hours five days a week. Past admission physiatrist, therapy team and rehab RN have worked together to provide customized collaborative inpatient rehab.  Anticoagulation: Continue Asprin 81 mg daily. Repeat CBC in 1 week. If stable and no nose bleeding/other bleeding, resume Eliquis  2.5 mg bid.  Pain Management: Right hemiparesis/spasticity continue gabapentin  100 mg nightly.  Intermittent bilateral  shoulder pain, Lidocaine  patch, Voltaren  and K-pad for comfort.  Tramadol  and baclofen prn for severe pain. Baclofen decreased due to consistent hypotension. Consider outpatient botox for right hemiparesis/spasit  Mood/Behavior/Sleep: Patient started Lexapro for depressed mood and Ritalin 10 mg for arousal, showed much improvement.  Continue regimen as directed. Palliative Care team consulted for goals of care, patient and family agreeable for DNR limited.  Skin/Wound Care: Stage II sacral wound is stable.  Continue using Mepilex and Medihoney.  Wound care/signs and symptoms of infection discussed at discharge.    Fluid/Nutrition/Electrolytes: Dysphagia 3 diet with thin liquids with feeding assistance and using dentures at meal.Registered dietitian following in the setting of severe malnutrition.  P.o. intake has improved and she is eating up to 75% of meals.   Continue Ensure Plus High Protein po BID, each supplement provides 350 kcal and 20 grams of protein.  Megace twice daily.    Hypertension: Blood pressures were monitored per protocol.  Hyzaar, Cardizem , and Aldactone  discontinued and patient required gentle IVF hydration in the setting of hypotension.     Hyperlipidemia: Lipitor 40 mg    T2DM: Hgb A1c 6.0. Amaryl  has been held due to poor appetite and p.o intake. Blood sugars remained well controlled.    Right Breast CA: Post mastectomy, continue anastrozole  1 mg.  Chronic anemia: hemoglobin trend fluctuated, lowest 7.5. FOBT positive, GI consulted. Patient underwent EGD on 11/11 with normal findings, there was evidence of small blood in the oral cavity, likely related to epistaxis and nasal packing. Continue prophylaxis increased to 40 mg twice daily with addition of ferrous gluconate.  Neurology advised that since pt has had issues with epistaxis, plan to hold Eliquis  for another week then resume. However, after discussion with oncology, should platelets continue to drop Eliquis  should be  held.  [facility] has been contacted with these updates.    Epistaxis: Required Rhino Rocket duet to recurrent bleeding. Held Eliquis  starting 11/5 per neurology recommendation. ENT consulted and was agreeable to plan. Rhino Rocket removed on 11/12, no further bleeding. Continue saline nasal mist and Afrin as needed.   Paroxymal Atrial Flutter/Fib: Hr stable. Eliquis  held for epistaxis. Plan to resume see above.   Stage IIIa Lung Adenocarcinoma: s/p radiation -follow up with Oncology.   Hyponatremia/SIADH: Stable, continue to monitor.   Azotemia:  BUN and creatinine with mild improvement  while on IVF, encourage oral intake.   SLE: Stable, continue Plaquenil .  Urinary incontinence/UTI: Urinalysis was mildly positive, patient started on Keflex  500 mg twice daily for 7 days.  Follow-up sensitivities positive for E. coli sensitive to cefazolin .  Stage IIIa lung adenocarcinoma: Status post radiation.  Plan follow-up with oncology for monitoring of anemia/thrombocytopenia.  Discussed pt with oncology and f/u visit outpatient was scheduled, appreciate assistance.    Planned Outpatient Follow-Up:  -[facility] -PCP -PM&R - Oncology   Rehab course: During patient's stay in rehab weekly team conferences were held to monitor patient's progress, set goals and discuss barriers to discharge. At admission, patient required minimal to maximal assistance with activities of daily living (ADLs) and general mobility, and she transfers using moderate assistance.    Occupational Therapy: Patient has met 2 of 11 long-term goals due to improved activity tolerance, improved balance, and improved alertness although fluctuating.  Patient to discharge at overall total assist. Patient's care partner is independent to provide the necessary physical assistance at discharge.  She will benefit from ongoing OT in skilled nursing facility setting to continue therapy.  Physical Therapy: Patient has met not  long term goals due to alertness and weakness.  Patient to discharge at an wheelchair level total assist due to limited progress towards goals due to pain, weakness, and significant fatigue.  She will benefit from ongoing skilled PT services in SNF setting to continue to advance safe functional mobility, address ongoing impairments in strength, ROM, balance, endurance, gait, and minimize fall risk.   Speech Therapy: Patient has met 4 of 4 long-term goals and will discharge at overall mod level.  Patient will benefit from min to mod assist at meals for swallow safety and tolerates dysphagia 3/3 liquids when alert.  Discharge plan was discussed with patient and/or family member and they verbalized understanding and agreed with it.      Disposition:  Discharge disposition: 03-Skilled Nursing Facility        Diet: Dys3 thin with supervision   Special Instructions:  Repeat CBC and trend for low hgb and platelets. If platelets counts continue to drop hold off on resuming Eliquis . Otherwise If stable and no nose bleeding/other bleeding, resume Eliquis  2.5 mg bid.  -No driving or operating heavy machinery until cleared by provider  -No smoking or alcohol  or illicit drug use    Discharge Instructions     Ambulatory referral to Neurology   Complete by: As directed    An appointment is requested in approximately: 4-6 weeks   Ambulatory referral to Physical Medicine Rehab   Complete by: As directed    Hospital follow up 6 weeks      Allergies as of 12/09/2023       Reactions   Ciprofloxacin Itching, Swelling, Other (See Comments)   Possibly causing tremors?   Crestor  [rosuvastatin ] Other (See Comments)   Myalgia and back pain   Lisinopril  Cough   Ofev  [nintedanib] Nausea Only        Medication List     PAUSE taking these medications    apixaban  2.5 MG Tabs tablet Wait to take this until your doctor or other care provider tells you to start again. Commonly known as:  ELIQUIS  Take 1 tablet (2.5 mg total) by mouth 2 (two) times daily.       STOP taking these medications    Combivent Respimat 20-100 MCG/ACT Aers respimat Generic drug: Ipratropium-Albuterol    glimepiride  1 MG tablet  Commonly known as: AMARYL    omeprazole 20 MG capsule Commonly known as: PRILOSEC   spironolactone  25 MG tablet Commonly known as: ALDACTONE        TAKE these medications    acetaminophen  325 MG tablet Commonly known as: TYLENOL  Take 1-2 tablets (325-650 mg total) by mouth every 4 (four) hours as needed for mild pain (pain score 1-3).   albuterol  108 (90 Base) MCG/ACT inhaler Commonly known as: VENTOLIN  HFA Inhale 2 puffs into the lungs every 6 (six) hours as needed for wheezing or shortness of breath. What changed: Another medication with the same name was removed. Continue taking this medication, and follow the directions you see here.   anastrozole  1 MG tablet Commonly known as: ARIMIDEX  Take 1 tablet (1 mg total) by mouth daily.   aspirin  EC 81 MG tablet Take 1 tablet (81 mg total) by mouth daily. Swallow whole.   atorvastatin  40 MG tablet Commonly known as: LIPITOR Take 1 tablet (40 mg total) by mouth daily.   Baclofen 5 MG Tabs Take 1 tablet (5 mg total) by mouth 2 (two) times daily as needed for muscle spasms.   diclofenac  Sodium 1 % Gel Commonly known as: VOLTAREN  Apply 2 g topically 4 (four) times daily. To left shoulder What changed:  how much to take when to take this reasons to take this additional instructions   diltiazem  120 MG 24 hr capsule Commonly known as: Cardizem  CD Take 1 capsule (120 mg total) by mouth daily.   escitalopram 10 MG tablet Commonly known as: LEXAPRO Take 1 tablet (10 mg total) by mouth at bedtime.   feeding supplement Liqd Take 237 mLs by mouth 2 (two) times daily between meals.   ferrous gluconate 324 MG tablet Commonly known as: FERGON Take 1 tablet (324 mg total) by mouth daily with breakfast.    gabapentin  100 MG capsule Commonly known as: NEURONTIN  Take 1 capsule (100 mg total) by mouth at bedtime. What changed:  medication strength how much to take when to take this   hydroxychloroquine  200 MG tablet Commonly known as: PLAQUENIL  Take 200 mg by mouth daily.   leptospermum manuka honey Pste paste Apply 1 Application topically daily.   levothyroxine  100 MCG tablet Commonly known as: SYNTHROID  Take 100 mcg by mouth daily.   lidocaine  5 % Commonly known as: LIDODERM  Place 1 patch onto the skin daily. Remove & Discard patch within 12 hours or as directed by MD   lip balm ointment Apply topically as needed.   magnesium  hydroxide 400 MG/5ML suspension Commonly known as: MILK OF MAGNESIA Take 15 mLs by mouth daily as needed for moderate constipation.   megestrol 400 MG/10ML suspension Commonly known as: MEGACE Take 10 mLs (400 mg total) by mouth 2 (two) times daily.   methylphenidate 10 MG tablet Commonly known as: RITALIN Take 1 tablet (10 mg total) by mouth 2 (two) times daily with breakfast and lunch.   multivitamin Liqd Take 15 mLs by mouth daily.   nystatin  100000 UNIT/ML suspension Commonly known as: MYCOSTATIN  Use as directed 5 mLs (500,000 Units total) in the mouth or throat 4 (four) times daily.   pantoprazole  40 MG tablet Commonly known as: PROTONIX  Take 1 tablet (40 mg total) by mouth 2 (two) times daily before a meal.   polyethylene glycol 17 g packet Commonly known as: MIRALAX  / GLYCOLAX  Take 17 g by mouth daily.   senna-docusate 8.6-50 MG tablet Commonly known as: Senokot-S Take 1 tablet by mouth at bedtime as  needed for mild constipation.   sodium chloride  0.65 % Soln nasal spray Commonly known as: OCEAN Place 1 spray into both nostrils as needed for congestion (nasal dryness).   sodium chloride  1 g tablet Take 1 tablet (1 g total) by mouth 2 (two) times daily with a meal.   sucralfate  1 g tablet Commonly known as: CARAFATE  Take 1 g  by mouth 4 (four) times daily.   traMADol  50 MG tablet Commonly known as: ULTRAM  Take 1 tablet (50 mg total) by mouth every 6 (six) hours as needed for severe pain (pain score 7-10).        Follow-up Information     [name], MD. Call.   Specialty: Family Medicine Why: after discharge from SNF Contact information: [address] [place] [address] [phone]         [facility] Follow up.   Why: office will call you with follow up appointment Contact information: [address]     [address] [place]  [address] [phone]        Urbano Albright, MD Follow up.   Specialty: Physical Medicine and Rehabilitation Why: office will call you with follow up appointment Contact information: [address] [place] [address] [phone]                 Signed: Daphne LOISE Satterfield 12/09/2023, 10:32 AM

## 2023-12-08 NOTE — Progress Notes (Signed)
 Occupational Therapist participated in the interdisciplinary team conference, providing clinical information regarding the patient's current status, treatment goals, and weekly focus, including any barriers that need to be addressed. Please see the Inpatient Rehabilitation Team Conference and Plan of Care Update for further details.   D'mariea Osceola Depaz OTR/L

## 2023-12-08 NOTE — Progress Notes (Signed)
 Physical Therapist participated in the interdisciplinary team conference, providing clinical information regarding the patient's current status, treatment goals, and weekly focus, including any barriers that need to be addressed. Please see the Inpatient Rehabilitation Team Conference and Plan of Care Update for further details.     Comer Pines, PT, DPT

## 2023-12-08 NOTE — Progress Notes (Signed)
 Occupational Therapy Session Note  Patient Details  Name: ACHAIA GARLOCK MRN: [id number] Date of Birth: 12-29-1940  Today's Date: 12/08/2023 OT Individual Time: 8897-8868 OT Individual Time Calculation (min): 29 min    Short Term Goals: Week 4:  OT Short Term Goal 1 (Week 4): STG =LTG d/t ELOS  Skilled Therapeutic Interventions/Progress Updates:  Patient agreeable to participate in OT session. Reports no pain level.   Patient participated in skilled OT session focusing on DC planning, UE use, functional grooming, and alertness. Patient received in chair in room. Patient demonstrated increased alertness patient able to remain alert for 10-15 minutes without verbal cueing. Patient completed face washing and oral hygiene SU assist in chair. Patient max A to total A for UE ROM> patient left in chair alarm on all needs in reach.      Therapy Documentation Precautions:  Precautions Precautions: Fall Recall of Precautions/Restrictions: Intact Precaution/Restrictions Comments: SBP <180, R hemiparesis Required Braces or Orthoses: Other Brace Other Brace: R LE PRAFO, R UE sling, R UE resting hand splint Restrictions Weight Bearing Restrictions Per Provider Order: No  Therapy/Group: Individual Therapy  D'mariea L Cecilia Nishikawa 12/08/2023, 7:10 AM

## 2023-12-08 NOTE — Progress Notes (Signed)
 Physical Therapy Session Note  Patient Details  Name: [name] MRN: [id number] Date of Birth: [birth year]  Today's Date: 12/08/2023 PT Individual Time: 0900-0929 PT Individual Time Calculation (min): 29 min   Short Term Goals: Week 4:  PT Short Term Goal 1 (Week 4): STG=LTG due to ELOS  Skilled Therapeutic Interventions/Progress Updates:     Pt supine with HOB elevated upon arrival. Denies pain but agreeable to therapy. Appears more alert today. Pt in air mattress bed. Session emphasized functional strengthening and endurance/activity tolerance with transfers and seated balance. Pt donned pants in supine position and sweatshirt in seated position with max-total A. Supine <> sitting EOB transfer max A (+2). Pt required max-total A (+2) at times to maintain seated balance to visit with daughters in room. PT provided verbal cueing for use of L UE to assist with maintaining upright position EOB. Pt performed squat pivot transfer EOB to TIS with total A. Seatbelt alarm on and all needs within reach at end of session. Nursing present at end of PT session.  Therapy Documentation Precautions:  Precautions Precautions: Fall Recall of Precautions/Restrictions: Intact Precaution/Restrictions Comments: SBP <180, R hemiparesis Required Braces or Orthoses: Other Brace Other Brace: R LE PRAFO, R UE sling, R UE resting hand splint Restrictions Weight Bearing Restrictions Per Provider Order: No  Therapy/Group: Individual Therapy  [name], PT, DPT 12/08/2023, 8:15 AM

## 2023-12-08 NOTE — Progress Notes (Addendum)
 1                                                        PROGRESS NOTE    Subjective/Complaints: No new complaints this AM. SNF discharge scheduled for tomorrow. Denies pain today.   ROS: Limited due to cognitive/behavioral  Denies chest pain or shortness of breath.  Denies abdominal pain. Denies headache.  Objective:   No results found.     Recent Labs    12/06/23 0831 12/08/23 0430  WBC 7.5 7.1  HGB 7.8* 7.5*  HCT 23.5* 22.4*  PLT 68* 59*    Recent Labs    12/06/23 0831 12/08/23 0430  NA 134* 134*  K 4.0 3.7  CL 104 106  CO2 19* 19*  GLUCOSE 107* 96  BUN 22 19  CREATININE 0.87 0.85  CALCIUM  8.8* 8.4*      Intake/Output Summary (Last 24 hours) at 12/08/2023 0934 Last data filed at 12/08/2023 0851 Gross per 24 hour  Intake 1226.91 ml  Output --  Net 1226.91 ml     Wound 12/02/23 1653 Pressure Injury Sacrum Mid Stage 2 -  Partial thickness loss of dermis presenting as a shallow open injury with a red, pink wound bed without slough. (Active)    Physical Exam: Vital Signs Blood pressure (!) 142/52, pulse 88, temperature 98 F (36.7 C), resp. rate 17, height 5' 6.5 (1.689 m), weight 55 kg, SpO2 100%. Constitutional: No distress . Vital signs reviewed. Alert, lying in bed appears comfortable  HEENT: NCAT, EOMI, oral membranes moist, nasal pack in place, no current bleeding noted Neck: supple Cardiovascular: RRR without murmur. No JVD    Respiratory/Chest: CTA Bilaterally without wheezes or rales. Normal effort    GI/Abdomen: BS +, non-tender, non-distended Ext: no clubbing, cyanosis, or edema Psych: pleasant and cooperative  Skin: warm PRIOR EXAMS: Neurological: Limited verbal output but able to answer simple questions including reason for admission, answer simple questions with delay similar to prior, eyes open. Smiling.  Decreased sensation to pain in right lower extremity.  Continued RUE and RLE weakness. Does not initiate movement on  right  Tone: MAS 2 right shoulder, MAS 3 right elbow, MAS 0 wrists, fingers, MAS 2 knee flexors,  Musculoskeletal: minimal C spine tenderness today   Prior neuro assessment is c/w today's exam 12/08/2023.    Assessment/Plan: 1. Functional deficits which require 3+ hours per day of interdisciplinary therapy in a comprehensive inpatient rehab setting. Physiatrist is providing close team supervision and 24 hour management of active medical problems listed below. Physiatrist and rehab team continue to assess barriers to discharge/monitor patient progress toward functional and medical goals  Care Tool:  Bathing    Body parts bathed by patient: Right arm, Left arm, Chest, Abdomen, Front perineal area   Body parts bathed by helper: Abdomen, Chest, Front perineal area, Buttocks, Right upper leg, Left upper leg, Right lower leg, Left lower leg, Face     Bathing assist Assist Level: Total Assistance - Patient < 25%     Upper Body Dressing/Undressing Upper body dressing   What is the patient wearing?: Hospital gown only, Pull over shirt    Upper body assist Assist Level: Total Assistance - Patient < 25%    Lower Body Dressing/Undressing Lower body dressing      What is the  patient wearing?: Underwear/pull up     Lower body assist Assist for lower body dressing: Total Assistance - Patient < 25%     Toileting Toileting    Toileting assist Assist for toileting: Dependent - Patient 0%     Transfers Chair/bed transfer  Transfers assist  Chair/bed transfer activity did not occur: Safety/medical concerns (fatigue)  Chair/bed transfer assist level: Total Assistance - Patient < 25%     Locomotion Ambulation   Ambulation assist   Ambulation activity did not occur: Safety/medical concerns (weakness/fatigue/pain)          Walk 10 feet activity   Assist  Walk 10 feet activity did not occur: Safety/medical concerns        Walk 50 feet activity   Assist Walk 50  feet with 2 turns activity did not occur: Safety/medical concerns         Walk 150 feet activity   Assist Walk 150 feet activity did not occur: Safety/medical concerns         Walk 10 feet on uneven surface  activity   Assist Walk 10 feet on uneven surfaces activity did not occur: Safety/medical concerns         Wheelchair     Assist Is the patient using a wheelchair?: Yes Type of Wheelchair: Manual    Wheelchair assist level: Dependent - Patient 0%      Wheelchair 50 feet with 2 turns activity    Assist        Assist Level: Dependent - Patient 0%   Wheelchair 150 feet activity     Assist      Assist Level: Dependent - Patient 0%   Blood pressure (!) 142/52, pulse 88, temperature 98 F (36.7 C), resp. rate 17, height 5' 6.5 (1.689 m), weight 55 kg, SpO2 100%.  Medical Problem List and Plan: 1. Functional deficits secondary to  bilateral embolic shower, embolic pattern, etiology:  Afib and missed Eliquis  doses vs from NSTEMI with cardiac cath.              -patient may  shower             -ELOS/Goals: SNF pending?-              Continue CIR PT, OT and SLP as tolerated  - Ordered PRAFO/WHO right upper extremity, sling for right upper extremity also ordered  11/6-7-appreciate palliative care assistance. CT of head demonstrates no acute changes which would account for presentation.   -suspect a mood component    -pt is now DNR/DNI    -will need to pursue SNF vs hospice if no improvement over weekend-social work working with family on finding SNF bed  Requested palliative care speak with family today to discuss goals of care  Team conference tomorrow  - SLP session this morning with improved speech intelligibility compared to last week reported.  - Speech appears to be improved this week similar to her prior baseline.  She has been able to articulate answers to simple questions and appears alert.  -SNF bed tomorrow available-plan for DC  -Team  conference today please see physician documentation under team conference tab, met with team  to discuss problems,progress, and goals. Formulized individual treatment plan based on medical history, underlying problem and comorbidities.   - Follow-up PM&R clinic outpatient    2.  Anemia: Eliquis  held due to dropping Hgb, GI consulted, patient told GI she did not want EGD but family says she wants EDG, see #1, will  check CBC today to determine if Eliquis  can be restarted, clear to restart as per GI             -antiplatelet therapy: Asprin 81 mg   3. Pain Management: Gabapentin  decreased to HS due to lethargy  -Voltaren  R shoulder for R shoulder pain  - 10/17 patient now reporting some pain in her left shoulder, suspect from increased use with therapy.  Right shoulder doing better.  Will try Voltaren  gel to left shoulder, consider imaging if does not improve  10/18 added kpad for neck/shoulder as well  -10/22-23 Continue voltaren  gel for occasional shoulder pain  - Can try K-pad for neck as she has some soreness this morning  11-19-25 no complaints of pain  10/28, intermittent shoulder pain, had pain yesterday and had rapid response called yesterday to r/o cardiac event and had EKG/Trop, was found to have pain in trapezius muscle. Xray L shoulder, try lidocaine  patch  10/30 neck pain reported this AM, spoke with nursing early afternoon, pain improved with heatpack/repositioning and voltaren  gel  11/1 tramadol  prn for severe pain, C spine Xray, she just got tylenol  few min before I saw her  11/2 C spine xray with spondylosis, neck pain doing better today  11/3- Pt required tramadol  last night for shoulder pain- was better this AM  11/7-  using kpad, tramadol   11/12 pain controlled overall 4. Mood/Behavior/Sleep: LCSW to follow for evaluation and support when available.              -antipsychotic agents: N/A  -11/6 pt on lexapro for mood--change to hs  Continue ritalin to 10mg  to see if it helps  arousal/mood  -11/12 continue current regimen, daughter yesterday felt arousal improved after ritalin started, mood appears to be a little improved  5. Neuropsych/cognition: This patient is not  capable of making decisions on her own behalf.  6. Skin/Wound Care: routine pressure relief measures.   7. Fluids/Electrolytes/Nutrition: monitor intake and output. Follow up labs in a.m.              - SLP consult: Carb modified diet + Ensure  -nutrition consult for decreased p.o. intake  10/18 intake remains minimal. RD following   -added megace for appetitte  10/19 obsv today. Recheck labs, prealbumin in AM   10/20 Prealbumin 21 this AM, continue to monitor PO intake  10/22 Hx of pain with swallowing, appears has been chronic for several, nursing reports daughter said yesterday she was seen by GI in the past. 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 Continue aspirin  and statin.   9. CAD: 10/4-10/7 admitted for NSTEMI  10. HLD: Lipitor 40 mg  11. HTN: monitor blood pressures on  Hyzaar, Cardizem , and Aldactone      12/08/2023    3:03 AM 12/07/2023    7:42 PM 12/07/2023    1:05 PM  Vitals with BMI  Systolic 142 147 867  Diastolic 52 47 39  Pulse 88 84 79  -DBP consistently  hypotensive:, decrease baclofen to BID prn - 11/10-11 systolic stable but diastolic remains hypotensive, will start some gentle normal saline infusion -11/12 continue IVF for now 12. Paroxymal Atrial Flutter/Fib: continue Eliquis  2.5  bid  11/5- Regular rhythm this AM 13. T2DM: HgbA1c 6.0, resumed home Amaryl   - monitor cbg AC/HS with SSI.  CBG (last 3)  Recent Labs    12/07/23 1628 12/07/23 2111 12/08/23 0535  GLUCAP 100* 121* 92  - 10/15 Will order CBG/SSI  - CBGs well-controlled, holding amaryl . But not eating much  -11/1 CBGs well controlled 11/7 generally well controlled due to lack of eating- con't to encourage pt to eat more. 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  Hypothyroidism: Synthroid  17.  Chronic anemia: Stable, monitor CBC.   -10/17 Hgb improved to 10 yesterday  -10/18 lower HGB at 8.4 suspect dilutional due to IVF, recheck tomorrow  - 10-26: Hemoglobin from 10-8.2.  May be dilutional due to IV fluids, will get iron studies and FOBT to evaluate --FOBT positive, hesitant to hold [facility] given recent embolic strokes, will increase GI prophylaxis to 40 mg twice daily and defer AC discussion to primary team.  Trend hemoglobin. -10/27 stable overall at 8.2, appears to fluctuate, continue to monitor for now for reasons as above  -10/30 slightly lower at 7.9, will call GI Dr. Fayne- called left voicemail with my callback information -11/1 discussed with GI Dr. Fayne yesterday, continue current and monitor trend for now, call GI back if continues to decline -11/2 HGB 8.1 stable, continue tomitor 11/7   hgb has dropped further from  8.0, to 7.5 may be due to recent epistaxis, platelets sl less too, again   -will contact GI again for further recs. See #2  11/10 patient has EGD scheduled for tomorrow, hemoglobin 7.8.  Appreciate GI assistance  11/11 EGD completed this morning, GI signed off  11/12 GI had said okay for their perspective to resume Eliquis  in prior note.  However she has had issues with epistaxis and hemoglobin low at 7.5.  Discussed resumption of Eliquis .  Discussed with neurology Dr.  Michaela appreciate assistance.  Must weigh risk of bleeding versus risk of repeat stroke.  Since has had issues with epistaxis we will plan to hold for another week and assess for recheck at her next facility.  If hemoglobin stable and no further significant bleeding would recommend restarting the Eliquis . Will add ferrous gluconate.   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  Continue to monitor  -10/20 NA 131 today, continue to monitor   -10/23 NA stable 131    10/30 NA 129, overall stable. Appears hyponatremia has been chronic for several years.   -10/31, Na stable overall, appears has been low for many years, discuss with nephrology Dr. Melia, continue to monitor as long in stable range, will check TSH/cortisol next labs  Recheck BMP/check TSH/Cortisol Monday. Hyponatremia has been chronic for years, dont think she needs fluid restrictions  11/6- Na 130- is stable  11/10 sodium up to 134  Recheck tomorrow 21. Azotemia  -10/20 IVF NS 50ml/hr  -10/23 BUN and CR improved, continue  IVF at night for poor oral intake, can hold if drinks 1L  10-25: Creatinine increased to 1.02; increasing IV fluids as above  10-26: Creatinine 0.94, DC IV fluids due to SIADH as above.  -10/27 Cr/BUN  trending up as IVF stopped, recheck tomorrow  -10/30 Bun 20, 0.93 Cr stable overall   Recheck tomorrow  11/7==BUN still at 23, eating nothing--continue  IVF  11/11 start gentle IVF 50ml/hr as above, recheck tomorrow labs  11/12 BUN and creatinine a little improved today, continue IVF for now due to poor fluid intake for another 12 hours   22.  Urinary incontinence/UTI  - 10-25 urinalysis mildly positive; start Keflex  500 mg twice daily for 7 days;  follow sensitivities- Ecoli sensitive to cefazolin - continue current regimen  23.  Right hemiparesis/spasticity  - 10-26: Due to cognitive issues and lethargy, hesitant to schedule muscle relaxers.  Start baclofen 5 mg 3 times daily as needed.  -Continue PRN as above to avoid sedation  -Consider outpatient botox  24. Epistaxis  -plavix still on hold.  -rhino rocket dc'ed yesterday  *addendum 11/7 @1330 --recurrent bleeding. Rhino rocket replaced  Appreciate ENT following   -11/12 HGB 7.5 today, remove Rhino Rocket today.  Does not appear to have active bleeding.  Last ENT note indicates pack can come out in few more days    LOS: 29 days A FACE TO FACE EVALUATION WAS PERFORMED  [name] 12/08/2023, 9:34 AM

## 2023-12-08 NOTE — Plan of Care (Signed)
  Problem: Consults Goal: RH STROKE PATIENT EDUCATION Description: See Patient Education module for education specifics  Outcome: Progressing   Problem: RH SAFETY Goal: RH STG ADHERE TO SAFETY PRECAUTIONS W/ASSISTANCE/DEVICE Description: STG Adhere to Safety Precautions With cues Assistance/Device. Outcome: Progressing   Problem: RH KNOWLEDGE DEFICIT Goal: RH STG INCREASE KNOWLEDGE OF DIABETES Description: Patient and dtr will be able to manage DM using educational resources for medications and dietary modification independently Outcome: Progressing Goal: RH STG INCREASE KNOWLEDGE OF HYPERTENSION Description: Patient and dtr will be able to manage HTN using educational resources for medications and dietary modification independently Outcome: Progressing Goal: RH STG INCREASE KNOWLEGDE OF HYPERLIPIDEMIA Description: Patient and dtr will be able to manage HLD using educational resources for medications and dietary modification independently Outcome: Progressing Goal: RH STG 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 Description: Patient and dtr will be able to manage secondary risks using educational resources for medications and dietary modification independently Outcome: Progressing   Problem: Education: Goal: Ability to describe self-care measures that may prevent or decrease complications (Diabetes Survival Skills Education) will improve Outcome: Progressing Goal: Individualized Educational Video(s) Outcome: Progressing   Problem: Coping: Goal: Ability to adjust to condition or change in health will improve Outcome: Progressing   Problem: Fluid Volume: Goal: Ability to maintain a balanced intake and output will improve Outcome: Progressing   Problem: Health Behavior/Discharge Planning: Goal: Ability to identify and utilize available resources and services will improve Outcome: Progressing Goal: Ability to manage health-related needs will improve Outcome: Progressing    Problem: Metabolic: Goal: Ability to maintain appropriate glucose levels will improve Outcome: Progressing   Problem: Nutritional: Goal: Maintenance of adequate nutrition will improve Outcome: Progressing Goal: Progress toward achieving an optimal weight will improve Outcome: Progressing   Problem: Skin Integrity: Goal: Risk for impaired skin integrity will decrease Outcome: Progressing   Problem: Tissue Perfusion: Goal: Adequacy of tissue perfusion will improve Outcome: Progressing

## 2023-12-08 NOTE — Progress Notes (Addendum)
 Inpatient Rehabilitation Discharge Medication Review by a Pharmacist  A complete drug regimen review was completed for this patient to identify any potential clinically significant medication issues.  High Risk Drug Classes Is patient taking? Indication by Medication  Antipsychotic No   Anticoagulant No   Antibiotic No   Opioid Yes Tramadol  - prn pain  Antiplatelet Yes bASA - CVA   Hypoglycemics/insulin  No   Vasoactive Medication Yes Diltiazem  - AF  Chemotherapy No   Other Yes Escitalopram - mood Anastrozole  - breast CA Atorvastatin  - HLD Methylphenidate - arousal/mood Gabapentin  - neuropathy Plaquenil  - SLE Synthroid  - hypothyroidism Megestrol - appetite Santyl - wound Diclofenac  gel, Lidoderm  - pain NaCl, MVI, ferrous gluconate - supplement Nystatin  - thrush Pantoprazole , sucralfate  - odynophagia, reflux  Acetaminophen  - prn pain Albuterol  - prn short of breath MOM, Miralax , Senokot-S - prn constipation Baclofen - prn muscle spasm     Type of Medication Issue Identified Description of Issue Recommendation(s)  Drug Interaction(s) (clinically significant)     Duplicate Therapy     Allergy     No Medication Administration End Date     Incorrect Dose     Additional Drug Therapy Needed     Significant med changes from prior encounter (inform family/care partners about these prior to discharge). PTA medications not resumed: -apixaban  held until repeat CBC complete and no bleeding noted -glimepiride  discontinued -Combivent discontinued -spironolactone  discontinued -omeprazole changed to pantoprazole  Communicate changes to patient/family on discharge  Other        Clinically significant medication issues were identified that warrant physician communication and completion of prescribed/recommended actions by midnight of the next day:  No  Name of provider notified for urgent issues identified:   Provider Method of Notification:     Pharmacist comments:    Time spent performing this drug regimen review (minutes):  20  Delon Sax, PharmD, BCPS Clinical Pharmacist Clinical phone for 12/08/2023 is [phone] 12/08/2023 11:32 AM

## 2023-12-09 LAB — CBC
HCT: 21.8 % — ABNORMAL LOW (ref 36.0–46.0)
Hemoglobin: 7.3 g/dL — ABNORMAL LOW (ref 12.0–15.0)
MCH: 30.2 pg (ref 26.0–34.0)
MCHC: 33.5 g/dL (ref 30.0–36.0)
MCV: 90.1 fL (ref 80.0–100.0)
Platelets: 58 K/uL — ABNORMAL LOW (ref 150–400)
RBC: 2.42 MIL/uL — ABNORMAL LOW (ref 3.87–5.11)
RDW: 18.2 % — ABNORMAL HIGH (ref 11.5–15.5)
WBC: 7.2 K/uL (ref 4.0–10.5)
nRBC: 0 % (ref 0.0–0.2)

## 2023-12-09 LAB — BASIC METABOLIC PANEL WITH GFR
Anion gap: 7 (ref 5–15)
BUN: 13 mg/dL (ref 8–23)
CO2: 17 mmol/L — ABNORMAL LOW (ref 22–32)
Calcium: 8.3 mg/dL — ABNORMAL LOW (ref 8.9–10.3)
Chloride: 107 mmol/L (ref 98–111)
Creatinine, Ser: 0.75 mg/dL (ref 0.44–1.00)
GFR, Estimated: >60 mL/min (ref >60)
Glucose, Bld: 95 mg/dL (ref 70–99)
Potassium: 3.5 mmol/L (ref 3.5–5.1)
Sodium: 131 mmol/L — ABNORMAL LOW (ref 135–145)

## 2023-12-09 LAB — GLUCOSE, CAPILLARY
Glucose-Capillary: 95 mg/dL (ref 70–99)
Glucose-Capillary: 121 mg/dL — ABNORMAL HIGH (ref 70–99)

## 2023-12-09 NOTE — Progress Notes (Addendum)
 1                                                        PROGRESS NOTE    Subjective/Complaints: No new complaints today. Plan for SNF transfer today.  ROS: Limited due to cognitive/behavioral  No CP, SOB, HA, Neck pain, nausea, vomiting, abdominal pain, new Vision changes  Objective:   No results found.     Recent Labs    12/08/23 0430 12/09/23 0517  WBC 7.1 7.2  HGB 7.5* 7.3*  HCT 22.4* 21.8*  PLT 59* 58*    Recent Labs    12/08/23 0430 12/09/23 0517  NA 134* 131*  K 3.7 3.5  CL 106 107  CO2 19* 17*  GLUCOSE 96 95  BUN 19 13  CREATININE 0.85 0.75  CALCIUM  8.4* 8.3*      Intake/Output Summary (Last 24 hours) at 12/09/2023 1318 Last data filed at 12/09/2023 0820 Gross per 24 hour  Intake 645.43 ml  Output --  Net 645.43 ml     Wound 12/02/23 1653 Pressure Injury Sacrum Mid Stage 2 -  Partial thickness loss of dermis presenting as a shallow open injury with a red, pink wound bed without slough. (Active)    Physical Exam: Vital Signs Blood pressure (!) 137/45, pulse 82, temperature 98.7 F (37.1 C), temperature source Oral, resp. rate 18, height 5' 6.5 (1.689 m), weight 55 kg, SpO2 99%. Constitutional: No distress . Vital signs reviewed. Alert, lying in bed appears comfortable  HEENT: NCAT, EOMI, oral membranes moist, nasal pack has been removed Neck: supple Cardiovascular: RRR without murmur. No JVD    Respiratory/Chest: CTA Bilaterally without wheezes or rales. Normal effort    GI/Abdomen: BS +, non-tender, non-distended Ext: no clubbing, cyanosis, or edema Psych: pleasant and cooperative  Skin: warm PRIOR EXAMS: Neurological: Awake, alert, follows simple commands, answers simple questions such as location, why she is here, yes or no questions mostly appropriately, Smiling.  Decreased sensation to pain in right lower extremity.  Continued RUE and RLE weakness. Does not initiate movement on right  Tone: MAS 2 right shoulder, MAS 3 right elbow,  MAS 0 wrists, fingers, MAS 2 knee flexors,  Musculoskeletal: minimal C spine tenderness today   Prior neuro assessment is c/w today's exam 12/09/2023.    Assessment/Plan: 1. Functional deficits which require 3+ hours per day of interdisciplinary therapy in a comprehensive inpatient rehab setting. Physiatrist is providing close team supervision and 24 hour management of active medical problems listed below. Physiatrist and rehab team continue to assess barriers to discharge/monitor patient progress toward functional and medical goals  Care Tool:  Bathing    Body parts bathed by patient: Right arm, Left arm, Chest   Body parts bathed by helper: Abdomen, Chest, Front perineal area, Buttocks, Right upper leg, Left upper leg, Right lower leg, Left lower leg, Face, Left arm, Right arm     Bathing assist Assist Level: Total Assistance - Patient < 25%     Upper Body Dressing/Undressing Upper body dressing   What is the patient wearing?: Hospital gown only, Pull over shirt    Upper body assist Assist Level: Total Assistance - Patient < 25%    Lower Body Dressing/Undressing Lower body dressing      What is the patient wearing?: Underwear/pull up  Lower body assist Assist for lower body dressing: Total Assistance - Patient < 25%     Toileting Toileting    Toileting assist Assist for toileting: Dependent - Patient 0%     Transfers Chair/bed transfer  Transfers assist  Chair/bed transfer activity did not occur: Safety/medical concerns (fatigue)  Chair/bed transfer assist level: 2 Helpers     Locomotion Ambulation   Ambulation assist   Ambulation activity did not occur: Safety/medical concerns (weakness/fatigue/pain)          Walk 10 feet activity   Assist  Walk 10 feet activity did not occur: Safety/medical concerns (weakness/fatigue/pain)        Walk 50 feet activity   Assist Walk 50 feet with 2 turns activity did not occur: Safety/medical  concerns (weakness/fatigue/pain)         Walk 150 feet activity   Assist Walk 150 feet activity did not occur: Safety/medical concerns (weakness/fatigue/pain)         Walk 10 feet on uneven surface  activity   Assist Walk 10 feet on uneven surfaces activity did not occur: Safety/medical concerns (weakness/fatigue/pain)         Wheelchair     Assist Is the patient using a wheelchair?: Yes Type of Wheelchair: Manual    Wheelchair assist level: Dependent - Patient 0%      Wheelchair 50 feet with 2 turns activity    Assist        Assist Level: Dependent - Patient 0%   Wheelchair 150 feet activity     Assist      Assist Level: Dependent - Patient 0%   Blood pressure (!) 137/45, pulse 82, temperature 98.7 F (37.1 C), temperature source Oral, resp. rate 18, height 5' 6.5 (1.689 m), weight 55 kg, SpO2 99%.  Medical Problem List and Plan: 1. Functional deficits secondary to  bilateral embolic shower, embolic pattern, etiology:  Afib and missed Eliquis  doses vs from NSTEMI with cardiac cath.              -patient may  shower             -ELOS/Goals: SNF pending?-              Continue CIR PT, OT and SLP as tolerated  - Ordered PRAFO/WHO right upper extremity, sling for right upper extremity also ordered  11/6-7-appreciate palliative care assistance. CT of head demonstrates no acute changes which would account for presentation.   -suspect a mood component    -pt is now DNR/DNI    -will need to pursue SNF vs hospice if no improvement over weekend-social work working with family on finding SNF bed  Requested palliative care speak with family today to discuss goals of care  Team conference tomorrow  - SLP session this morning with improved speech intelligibility compared to last week reported.  - Speech appears to be improved this week similar to her prior baseline.  She has been able to articulate answers to simple questions and appears alert.  -SNF  bed tomorrow available-plan for DC  - Follow-up PM&R clinic outpatient- DC today    2.  Anemia: Eliquis  held due to dropping Hgb, GI consulted, patient told GI she did not want EGD but family says she wants EDG, see #1, will check CBC today to determine if Eliquis  can be restarted, clear to restart as per GI             -antiplatelet therapy: Asprin 81 mg   3. Pain  Management: Gabapentin  decreased to HS due to lethargy  -Voltaren  R shoulder for R shoulder pain  - 10/17 patient now reporting some pain in her left shoulder, suspect from increased use with therapy.  Right shoulder doing better.  Will try Voltaren  gel to left shoulder, consider imaging if does not improve  10/18 added kpad for neck/shoulder as well  -10/22-23 Continue voltaren  gel for occasional shoulder pain  - Can try K-pad for neck as she has some soreness this morning  11-19-25 no complaints of pain  10/28, intermittent shoulder pain, had pain yesterday and had rapid response called yesterday to r/o cardiac event and had EKG/Trop, was found to have pain in trapezius muscle. Xray L shoulder, try lidocaine  patch  10/30 neck pain reported this AM, spoke with nursing early afternoon, pain improved with heatpack/repositioning and voltaren  gel  11/1 tramadol  prn for severe pain, C spine Xray, she just got tylenol  few min before I saw her  11/2 C spine xray with spondylosis, neck pain doing better today  11/3- Pt required tramadol  last night for shoulder pain- was better this AM  11/7-  using kpad, tramadol   11/12 pain controlled overall 4. Mood/Behavior/Sleep: LCSW to follow for evaluation and support when available.              -antipsychotic agents: N/A  -11/6 pt on lexapro for mood--change to hs  Continue ritalin to 10mg  to see if it helps arousal/mood  -11/12 continue current regimen, daughter yesterday felt arousal improved after ritalin started, mood appears to be a little improved  -11/13 alertness mood improved, continue  current  5. Neuropsych/cognition: This patient is not  capable of making decisions on her own behalf.  6. Skin/Wound Care: routine pressure relief measures.   7. Fluids/Electrolytes/Nutrition: monitor intake and output. Follow up labs in a.m.              - SLP consult: Carb modified diet + Ensure  -nutrition consult for decreased p.o. intake  10/18 intake remains minimal. RD following   -added megace for appetitte  10/19 obsv today. Recheck labs, prealbumin in AM   10/20 Prealbumin 21 this AM, continue to monitor PO intake  10/22 Hx of pain with swallowing, appears has been chronic for several, nursing reports daughter said yesterday she was seen by GI in the past. Started PPI  10/23 called GI for consult- she has been seen by [facility] outpatient  10/24 Seen by GI appreciate- Barium swallow, Sucralfate   She ate 40-50% meals yesterday, poor intake but a little better  11-19-25: Eating 25 to 50% of meals  Seen by nutrition- for severe malnutrition, appreciate assistance  11/7 po intake is minimal, eating 5-10 % -continue megace 11/10 eating about 10 to 30% 11/11 diet has been resumed after EGD 11/12 continue to encourage PO intake, remains poor 11/13 ate better this AM 75%, she reports doesn't like food, hopefully with like better at next facility and this could help  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 Continue aspirin  and statin.   9. CAD: 10/4-10/7 admitted for NSTEMI  10. HLD: Lipitor 40 mg  11. HTN: monitor blood pressures on  Hyzaar, Cardizem , and Aldactone      12/09/2023    1:00 PM 12/09/2023    6:02 AM 12/08/2023    1:47 PM  Vitals with BMI  Systolic 137 132 851  Diastolic 45 47 47  Pulse 82 77 83  -DBP consistently hypotensive:, decrease baclofen to BID prn - 11/10-11  systolic stable but diastolic remains hypotensive, will start some gentle normal saline infusion -11/12 continue IVF for now -11/13 stable overall, IVF has been stopped 12. Paroxymal Atrial  Flutter/Fib: continue Eliquis  2.5 bid  11/5- Regular rhythm this AM 13. T2DM: HgbA1c 6.0, resumed home Amaryl   - monitor cbg AC/HS with SSI.  CBG (last 3)  Recent Labs    12/08/23 2124 12/09/23 0828 12/09/23 1224  GLUCAP 117* 95 121*  - 10/15 Will order CBG/SSI  - CBGs well-controlled, holding amaryl . But not eating much  -11/1 CBGs well controlled 11/7 generally well controlled due to lack of eating- con't to encourage pt to eat more. 11/12-13 controlled 14 Right Breast CA: Post mastectomy, on anastrozole   15. Stage IIIa Lung Adenocarcinoma: s/p radiation -follow up with Oncology   -Will contact oncology service to discuss and help facility f/u-discussed with Dr. Onesimo to help facilitate follow-up-will plan to pass along to her primary oncologist.  Also discussed regarding anemia/thrombocytopenia for continued monitoring outpatient.   Oncology note 12/09/23 Dr. Loretha ----- Embolic CVA, presented with right sided weakness Likely non compliance, pt wasn't taking her eliquis  as prescribed. I discussed with pt and her daughter about the importance of being compliant with anticoagulation. Ok to resume Eliquis  upon discharge and follow up with cardiology and neurology for anticoagulation follow up. I dont think she will be a good candidate for injectable blood thinners.   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 Not a candidate for concurrent chemoradiation or immunotherapy alone based on TMB status. Discussed sequential chemotherapy and radiation, acknowledging radiation alone may not be curative. - No actionable mutations, PDL1 TPS score is 0. - She underwent definitive radiation alone.  - Surveillance with follow-up scans in a few months to assess radiation therapy effectiveness.   Invasive ductal carcinoma of right breast, post-mastectomy, on anastrozole  Post-mastectomy status for  invasive ductal carcinoma of the right breast. Currently on anastrozole  with no reported side effects.   Normocytic normochromic anemia Likely secondary to chronic co-morbidites. Ok to monitor. -----------------------------   16.  Hypothyroidism: Synthroid  17.  Chronic anemia: Stable, monitor CBC.   -10/17 Hgb improved to 10 yesterday  -10/18 lower HGB at 8.4 suspect dilutional due to IVF, recheck tomorrow  - 10-26: Hemoglobin from 10-8.2.  May be dilutional due to IV fluids, will get iron studies and FOBT to evaluate --FOBT positive, hesitant to hold [facility] given recent embolic strokes, will increase GI prophylaxis to 40 mg twice daily and defer AC discussion to primary team.  Trend hemoglobin. -10/27 stable overall at 8.2, appears to fluctuate, continue to monitor for now for reasons as above  -10/30 slightly lower at 7.9, will call GI Dr. Fayne- called left voicemail with my callback information -11/1 discussed with GI Dr. Fayne yesterday, continue current and monitor trend for now, call GI back if continues to decline -11/2 HGB 8.1 stable, continue tomitor 11/7   hgb has dropped further from  8.0, to 7.5 may be due to recent epistaxis, platelets sl less too, again   -will contact GI again for further recs. See #2  11/10 patient has EGD scheduled for tomorrow, hemoglobin  7.8.  Appreciate GI assistance  11/11 EGD completed this morning, GI signed off  11/12 GI had said okay for their perspective to resume Eliquis  in prior note.  However she has had issues with epistaxis and hemoglobin low at 7.5.  Discussed resumption of Eliquis .  Discussed with neurology Dr. Michaela appreciate assistance.  Must weigh risk of bleeding versus risk of repeat stroke.  Since has had issues with epistaxis we will plan to hold for another week and assess for recheck at her next facility.  If hemoglobin stable and no further significant bleeding would recommend restarting the Eliquis . Will add ferrous gluconate.   Also would recommend holding off if platelet count continues to drop on CBC  11/13 platelets lower at 58, HGB down to 7.3 but suspect change may be at least partially dilutional. May be related to radiation therapy treatment.  Recommend repeat at SNF.  Discussed with oncology as above to continue follow-up after discharge.   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  Continue to monitor  -10/20 NA 131 today, continue to monitor   -10/23 NA stable 131    10/30 NA 129, overall stable. Appears hyponatremia has been chronic for several years.   -10/31, Na stable overall, appears has been low for many years, discuss with nephrology Dr. Melia, continue to monitor as long in stable range, will check TSH/cortisol next labs  Recheck BMP/check TSH/Cortisol Monday. Hyponatremia has been chronic for years, dont think she needs fluid restrictions  11/6- Na 130- is stable  11/10 sodium up to 134  -11/13 low but overall stable at 131 21. Azotemia  -10/20 IVF NS 50ml/hr  -10/23 BUN and CR improved, continue IVF at night for poor oral intake, can hold if drinks 1L  10-25: Creatinine increased to 1.02; increasing IV fluids as above  10-26: Creatinine 0.94, DC IV fluids due to SIADH as above.  -10/27 Cr/BUN  trending up as IVF stopped, recheck tomorrow  -10/30 Bun 20, 0.93 Cr stable overall   Recheck tomorrow  11/7==BUN still at 23, eating nothing--continue  IVF  11/11 start gentle IVF 50ml/hr as above, recheck tomorrow labs  11/13 BUN/CR down to 0.75/13 after IVF, fluids stopped   22.  Urinary incontinence/UTI  - 10-25 urinalysis mildly positive; start Keflex  500 mg twice daily for 7 days; follow sensitivities- Ecoli sensitive to cefazolin -  continue current regimen  23.  Right hemiparesis/spasticity  - 10-26: Due to cognitive issues and lethargy, hesitant to schedule muscle relaxers.  Start baclofen 5 mg 3 times daily as needed.  -Continue PRN as above to avoid sedation  -Consider outpatient botox  24. Epistaxis  -plavix still on hold.  -rhino rocket dc'ed yesterday  *addendum 11/7 @1330 --recurrent bleeding. Rhino rocket replaced  Appreciate ENT following   -11/12 HGB 7.5 today, remove Rhino Rocket today.  Does not appear to have active bleeding.  Last ENT note indicates pack can come out in few more days   11/13 Rhino rocked was removed yesterday, no further bleeding reported   LOS: 30 days A FACE TO FACE EVALUATION WAS PERFORMED  Seebeck Collier 12/09/2023, 1:18 PM

## 2023-12-09 NOTE — Plan of Care (Signed)
  Problem: Sit to Stand Goal: LTG:  Patient will perform sit to stand with assistance level (PT) Description: LTG:  Patient will perform sit to stand with assistance level (PT) Outcome: Not Met (add Reason) Flowsheets (Taken 12/09/2023 1601) LTG: PT will perform sit to stand in preparation for functional mobility with assistance level: (weakness/hemiplegia/pain/fatigue) --   Problem: RH Balance Goal: LTG Patient will maintain dynamic sitting balance (PT) Description: LTG:  Patient will maintain dynamic sitting balance with assistance during mobility activities (PT) Outcome: Completed/Met   Problem: RH Bed to Chair Transfers Goal: LTG Patient will perform bed/chair transfers w/assist (PT) Description: LTG: Patient will perform bed to chair transfers with assistance (PT). Outcome: Completed/Met

## 2023-12-09 NOTE — Progress Notes (Signed)
 Speech Language Pathology Discharge Summary  Patient Details  Name: PROMYSE ARDITO MRN: [id number] Date of Birth: 13-Nov-1940  Date of Discharge from SLP service:December 09, 2023  Patient has met 4 of 4 long term goals.  Patient to discharge at overall Mod level.  Reasons goals not met: n/a   Clinical Impression/Discharge Summary:   Patient has made limited progress across therapy admission, though met 4/4 long term goals met. During stay, goals downgraded to moderate assistance due to making limited gains and progress plateau. Patient currently benefits from min-mod assist at meals for swallow safety and tolerates Dys3/thin liquids when alert. She benefits from overall mod assist for use of speech intelligibility strategies, but can be as little as min when fully awake and participatory. She benefits from mod assist for basic orientation, problem solving, recall, and sustained attention. Patient and family education complete. SLP will sign off.   Care Partner:  Caregiver Able to Provide Assistance: Yes  Type of Caregiver Assistance: Cognitive  Recommendation:  Skilled Nursing facility  Rationale for SLP Follow Up: Maximize functional communication;Maximize cognitive function and independence;Maximize swallowing safety   Equipment: n/a   Reasons for discharge: Treatment goals met;Discharged from hospital   Patient/Family Agrees with Progress Made and Goals Achieved: Yes   Lamika Connolly, M.A., CCC-SLP   Suzanne Garbers A Ameerah Huffstetler 12/09/2023, 8:10 AM

## 2023-12-09 NOTE — Progress Notes (Signed)
 Inpatient Rehabilitation Care Coordinator Discharge Note   Patient Details  Name: [name] MRN: [id number] Date of Birth: Jan 06, 1941   Discharge location: [facility]  Length of Stay: 29 days  Discharge activity level: Total Assistance - Patient < 25%  Home/community participation: Homebound  Patient response un:Yzjouy Literacy - How often do you need to have someone help you when you read instructions, pamphlets, or other written material from your doctor or pharmacy?: Rarely (Patient understands most things.)  Patient response un:Dnrpjo Isolation - How often do you feel lonely or isolated from those around you?: Never  Services provided included: MD, RD, PT, OT, SLP, RN, CM, TR, Pharmacy, Neuropsych, SW  Financial Services:  Field Seismologist Utilized: Private Insurance [employer] MEDICARE / DREMA DUAL COMPLETE  Choices offered to/list presented to: Family  Follow-up services arranged:  Other (Comment) [facility] SNF)  Patient response to transportation need: Is the patient able to respond to transportation needs?: Yes In the past 12 months, has lack of transportation kept you from medical appointments or from getting medications?: No In the past 12 months, has lack of transportation kept you from meetings, work, or from getting things needed for daily living?: No   Patient/Family verbalized understanding of follow-up arrangements:  Other (Comment), Yes (Family)  Individual responsible for coordination of the follow-up plan: Family  Confirmed correct DME delivered: [name]  [name] 12/09/2023    Comments (or additional information): Patient discharging to [facility] Room 105. [facility] transport for 2:30pm.   Summary of Stay    Date/Time Discharge Planning CSW  12/08/23 1326 Bed offer accepted at [facility]. Auth started. Transportation set up with [facility] at for 11/13 at 2:30pm. DS   12/08/23 1038 Bed offered at [facility]. Auth started. Will set up to transportation for tomorrow as we are awaiting insurance auth. Family aware. DS  12/08/23 0929 Bed offered at [facility] - will set up to transfer DS  12/01/23 1233 Plans to make a decision of SNF bed offers. CAP-DA paperwork completed. Palliative involved. No DME or HH/OP follow-up due to placement. DS  12/01/23 1011 Plans are still SNF vs Home per daughter - she was provided with a list of bed offers and encouraged to make a decision. CAP-DA paperwork completed. Palliative involved. DS  11/30/23 1112 Plans are still SNF vs Home per daughter - she was provided with a list for preference and encouraged to make a decision. CAP-DA paperwork completed. Palliative involved. DS  11/23/23 1500 Met with daughter and son who are discussing discharge plan-made aware of 24/7 physical care. Palliative involved also. Family to get back with worker regarding plan home versus SNF RGD  11/22/23 1309 New plan to have patient discharged to SNF due to LOC. FL2 completed. Palliative care consult placed - awaiting visit. DS  11/17/23 1640 Plans to discharge home with daughter who will provide care. Family ed set up for 10/24 and 10/30. CAP-DA to begin. Wheelchair referral and [facility] PT/OT/SLP recommendations. DS  11/17/23 0905 Plans to discharge home with daughter who will provide care. Awaiting therapy follow up recommendations. DS       [name]  [name]

## 2023-12-09 NOTE — Plan of Care (Signed)
 Problem: RH Swallowing Goal: LTG Patient will consume least restrictive diet using compensatory strategies with assistance (SLP) Description: LTG:  Patient will consume least restrictive diet using compensatory strategies with assistance (SLP) Outcome: Completed/Met   Problem: RH Expression Communication Goal: LTG Patient will increase speech intelligibility (SLP) Description: LTG: Patient will increase speech intelligibility at word/phrase/conversation level with cues, % of the time (SLP) Outcome: Completed/Met   Problem: RH Problem Solving Goal: LTG Patient will demonstrate problem solving for (SLP) Description: LTG:  Patient will demonstrate problem solving for basic/complex daily situations with cues  (SLP) Outcome: Completed/Met   Problem: RH Memory Goal: LTG Patient will use memory compensatory aids to (SLP) Description: LTG:  Patient will use memory compensatory aids to recall biographical/new, daily complex information with cues (SLP) Outcome: Completed/Met

## 2023-12-09 NOTE — Plan of Care (Signed)
  Problem: Consults Goal: RH STROKE PATIENT EDUCATION Description: See Patient Education module for education specifics  Outcome: Progressing   Problem: RH SAFETY Goal: RH STG ADHERE TO SAFETY PRECAUTIONS W/ASSISTANCE/DEVICE Description: STG Adhere to Safety Precautions With cues Assistance/Device. Outcome: Progressing   Problem: RH KNOWLEDGE DEFICIT Goal: RH STG INCREASE KNOWLEDGE OF DIABETES Description: Patient and dtr will be able to manage DM using educational resources for medications and dietary modification independently Outcome: Progressing Goal: RH STG INCREASE KNOWLEDGE OF HYPERTENSION Description: Patient and dtr will be able to manage HTN using educational resources for medications and dietary modification independently Outcome: Progressing Goal: RH STG INCREASE KNOWLEGDE OF HYPERLIPIDEMIA Description: Patient and dtr will be able to manage HLD using educational resources for medications and dietary modification independently Outcome: Progressing Goal: RH STG 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 Description: Patient and dtr will be able to manage secondary risks using educational resources for medications and dietary modification independently Outcome: Progressing   Problem: Education: Goal: Ability to describe self-care measures that may prevent or decrease complications (Diabetes Survival Skills Education) will improve Outcome: Progressing Goal: Individualized Educational Video(s) Outcome: Progressing   Problem: Coping: Goal: Ability to adjust to condition or change in health will improve Outcome: Progressing   Problem: Fluid Volume: Goal: Ability to maintain a balanced intake and output will improve Outcome: Progressing   Problem: Health Behavior/Discharge Planning: Goal: Ability to identify and utilize available resources and services will improve Outcome: Progressing Goal: Ability to manage health-related needs will improve Outcome: Progressing    Problem: Metabolic: Goal: Ability to maintain appropriate glucose levels will improve Outcome: Progressing   Problem: Nutritional: Goal: Maintenance of adequate nutrition will improve Outcome: Progressing Goal: Progress toward achieving an optimal weight will improve Outcome: Progressing   Problem: Skin Integrity: Goal: Risk for impaired skin integrity will decrease Outcome: Progressing   Problem: Tissue Perfusion: Goal: Adequacy of tissue perfusion will improve Outcome: Progressing

## 2023-12-09 NOTE — Progress Notes (Deleted)
 Inpatient Rehabilitation Care Coordinator Discharge Note   Patient Details  Name: [name] MRN: [id number] Date of Birth: [birth year]   Discharge location: [facility]  Length of Stay: 29 days  Discharge activity level: Total Assistance - Patient < 25%  Home/community participation: Homebound  Patient response un:Yzjouy Literacy - How often do you need to have someone help you when you read instructions, pamphlets, or other written material from your doctor or pharmacy?: Never  Patient response un:Dnrpjo Isolation - How often do you feel lonely or isolated from those around you?: Never  Services provided included: MD, RD, PT, OT, SLP, RN, CM, TR, Pharmacy, Neuropsych, SW  Financial Services:  Field Seismologist Utilized: Private Insurance [employer] MEDICARE / DREMA DUAL COMPLETE  Choices offered to/list presented to: Patient/Family  Follow-up services arranged:  Other (Comment) (SNF Placement)  Patient response to transportation need: Is the patient able to respond to transportation needs?: Yes In the past 12 months, has lack of transportation kept you from medical appointments or from getting medications?: No In the past 12 months, has lack of transportation kept you from meetings, work, or from getting things needed for daily living?: No   Patient/Family verbalized understanding of follow-up arrangements:  Yes  Individual responsible for coordination of the follow-up plan: Family  Confirmed correct DME delivered: [name]  [name] 12/09/2023    Comments (or additional information): Patient discharging to SNF per patient/family request. Room 105. Transportation set up via [facility] for 2:30pm.   Summary of Stay    Date/Time Discharge Planning CSW  12/08/23 1326 Bed offer accepted at [facility]. Auth started. Transportation set up with [facility] at for 11/13 at 2:30pm. DS  12/08/23 1038 Bed offered at [facility].  Auth started. Will set up to transportation for tomorrow as we are awaiting insurance auth. Family aware. DS  12/08/23 0929 Bed offered at [facility] - will set up to transfer DS  12/01/23 1233 Plans to make a decision of SNF bed offers. CAP-DA paperwork completed. Palliative involved. No DME or HH/OP follow-up due to placement. DS  12/01/23 1011 Plans are still SNF vs Home per daughter - she was provided with a list of bed offers and encouraged to make a decision. CAP-DA paperwork completed. Palliative involved. DS  11/30/23 1112 Plans are still SNF vs Home per daughter - she was provided with a list for preference and encouraged to make a decision. CAP-DA paperwork completed. Palliative involved. DS  11/23/23 1500 Met with daughter and son who are discussing discharge plan-made aware of 24/7 physical care. Palliative involved also. Family to get back with worker regarding plan home versus SNF RGD  11/22/23 1309 New plan to have patient discharged to SNF due to LOC. FL2 completed. Palliative care consult placed - awaiting visit. DS  11/17/23 1640 Plans to discharge home with daughter who will provide care. Family ed set up for 10/24 and 10/30. CAP-DA to begin. Wheelchair referral and [facility] PT/OT/SLP recommendations. DS  11/17/23 0905 Plans to discharge home with daughter who will provide care. Awaiting therapy follow up recommendations. DS       [name]  [name]

## 2023-12-09 NOTE — Patient Care Conference (Signed)
 Inpatient RehabilitationTeam Conference and Plan of Care Update Date: 12/08/2023   Time: 09:32 AM    Patient Name: [name]      Medical Record Number: [id number]  Date of Birth: 1940-10-17 Sex: Female         Room/Bed: 4M09C/4M09C-01 Payor Info: Payor: [employer]ICARE / Plan: DREMA DUAL COMPLETE / Product Type: *No Product type* /    Admit Date/Time:  11/09/2023  2:30 PM  Primary Diagnosis:  Acute right hemiparesis [facility])  Hospital Problems: Principal Problem:   Acute right hemiparesis (HCC) Active Problems:   Hypothyroidism   Primary hypertension   UTI (urinary tract infection)   ILD (interstitial lung disease) (HCC)   Hyponatremia   AKI (acute kidney injury)   Paroxysmal atrial flutter (HCC)   Diabetes mellitus type 2, noninsulin dependent (HCC)   Chronic diastolic CHF (congestive heart failure) (HCC)   Long term (current) use of anticoagulants   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 Bugbee Collier Social Worker Present: Waverly Gentry, LCSW-A Nurse Present: Barnie Ronde, RN PT Present: Izetta Seip, PT OT Present: Michaelyn Seip, OT SLP Present: Rosina Downy, SLP     Current Status/Progress Goal Weekly Team Focus  Bowel/Bladder   Patient is incontinent of bowel and bladder. Last BM 11/12.   Patient will be continent of bowel and bladder.   Assist patient with toileting every 4 hours while awake.    Swallow/Nutrition/ Hydration   D3/thin   modA  ongoing diet tolerance, use of strategies,  increased timeliness of mastication    ADL's   total   family training for week- downgrading   balance, sitting tolerance, participation, OOB toleranc,e waiting on bed for SNF    Mobility   Max-total A (+2 at times) for all mobility and transfers; dependent WC   total A  SNF pending; Pt's overall progress toward goals limited by weakness, dense R hemiparesis, poor postural control, R UE/LE inattention, significant fatigue - improving?, and motivation; Barriers - activity tolerance/fatigue/lethargy, motivation    Communication   moderate dysarthria   modA   increased use of speech intelligibility strategies    Safety/Cognition/ Behavioral Observations  mod cognitive deficits   modA   sustained attention, increased alertness, problem solving, memory log    Pain   Patient neck, shoulder, and back pain. Treated with scheduled voltaren , lidocaine  patches, and PRN tylenol  and ultram .   Patient will report a pain score of less than 4/10.   Assess pain every shift and as needed. Provide interventions as necessary.    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 Treating with gerhardt's butt cream and a sacral heart. No other skin breakdown noted.   Healing of stage 2 on sacrum and prevention of further breakdown.  Assess skin every shift and as needed. Provide pressure relief to prevent the progression of the pressure injury. Monitor for incontinence and provide peri care promptly to prevent MASD.      Discharge Planning:  Bed offer accepted at [facility]. Auth started. Transportation set up with [facility] at for 11/13 at  2:30pm.   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 Chronic shoulder pain with new musculoskeletal radiation. RRT called  11/23/23, EKG completed. Epistaxis noted; Otolaryngologist consulted. Gastrologist consulted; EGD completed 12/07/2023 Functional progress limited by limited participation, poor balance, spasticity and lethargy.  Patient on target to meet rehab goals: Arousal improved after ritalin started, mood appears to be a little improved however continue to note poor appetite and little participation in therapy and little functional change noted overall.   *See Care Plan and progress notes for long and short-term goals.   Revisions to Treatment Plan:  Changed to DNR Palliative Care Consult Otolaryngologist consult; Eliquis  on hold; Rhino rocket in place Gastroenterologist consult  Teaching Needs: Safety, medications, skin care, transfers, toileting, etc.    Current Barriers to Discharge: Home enviroment access/layout, Incontinence, Wound care, and Lack of/limited family support  Possible Resolutions to Barriers: SNF placement recommended     Medical Summary Current Status: CVA, anemia, nosebleed, poor PO intake,DM2,  Barriers to Discharge: Electrolyte abnormality;Self-care education;Behavior/Mood;Complicated Wound  Barriers to Discharge Comments: CVA, anemia, nosebleed, poor PO intake,DM2 Possible Resolutions to [name] Focus: monitor CBC, continue lexapro/riatlin, monitor BP   Continued Need for Acute Rehabilitation Level of Care: The patient requires daily medical management by a physician with specialized training in physical medicine and rehabilitation for the following reasons: Direction of a multidisciplinary physical rehabilitation program to maximize functional independence : Yes Medical management of patient stability for increased activity during participation in an intensive rehabilitation regime.: Yes Analysis of laboratory values and/or radiology reports with any subsequent need for medication adjustment and/or medical intervention. : Yes   I attest that I was present,  lead the team conference, and concur with the assessment and plan of the team.   [name] 12/09/2023, 10:32 AM

## 2023-12-10 ENCOUNTER — Telehealth: Payer: Self-pay | Admitting: Hematology and Oncology

## 2023-12-10 NOTE — Telephone Encounter (Signed)
 I spoke with patient's daughter to schedule follow up visit with Dr. Loretha. Daughter stated the appointment scheduled for 12/16/2023 was [facility] and if anything changes, she would give me a call back.

## 2023-12-13 ENCOUNTER — Encounter: Payer: Self-pay | Admitting: Radiation Oncology

## 2023-12-13 ENCOUNTER — Ambulatory Visit
Admission: RE | Admit: 2023-12-13 | Discharge: 2023-12-13 | Disposition: A | Discharge status: Home / Self Care | Source: Ambulatory Visit | Attending: Radiation Oncology | Admitting: Radiation Oncology

## 2023-12-13 VITALS — BP 140/50 | HR 86 | Temp 97.4°F | Resp 20 | Ht 66.5 in

## 2023-12-13 DIAGNOSIS — Z791 Long term (current) use of non-steroidal anti-inflammatories (NSAID): Secondary | ICD-10-CM | POA: Insufficient documentation

## 2023-12-13 DIAGNOSIS — K449 Diaphragmatic hernia without obstruction or gangrene: Secondary | ICD-10-CM | POA: Insufficient documentation

## 2023-12-13 DIAGNOSIS — Z1732 Human epidermal growth factor receptor 2 negative status: Secondary | ICD-10-CM | POA: Insufficient documentation

## 2023-12-13 DIAGNOSIS — C50912 Malignant neoplasm of unspecified site of left female breast: Secondary | ICD-10-CM | POA: Insufficient documentation

## 2023-12-13 DIAGNOSIS — M47812 Spondylosis without myelopathy or radiculopathy, cervical region: Secondary | ICD-10-CM | POA: Insufficient documentation

## 2023-12-13 DIAGNOSIS — R1310 Dysphagia, unspecified: Secondary | ICD-10-CM | POA: Insufficient documentation

## 2023-12-13 DIAGNOSIS — Z170 Estrogen receptor positive status [ER+]: Secondary | ICD-10-CM | POA: Insufficient documentation

## 2023-12-13 DIAGNOSIS — Z923 Personal history of irradiation: Secondary | ICD-10-CM | POA: Insufficient documentation

## 2023-12-13 DIAGNOSIS — Z79811 Long term (current) use of aromatase inhibitors: Secondary | ICD-10-CM | POA: Insufficient documentation

## 2023-12-13 DIAGNOSIS — Z8673 Personal history of transient ischemic attack (TIA), and cerebral infarction without residual deficits: Secondary | ICD-10-CM | POA: Insufficient documentation

## 2023-12-13 DIAGNOSIS — D649 Anemia, unspecified: Secondary | ICD-10-CM | POA: Insufficient documentation

## 2023-12-13 DIAGNOSIS — I252 Old myocardial infarction: Secondary | ICD-10-CM | POA: Insufficient documentation

## 2023-12-13 DIAGNOSIS — Z9012 Acquired absence of left breast and nipple: Secondary | ICD-10-CM | POA: Insufficient documentation

## 2023-12-13 DIAGNOSIS — J90 Pleural effusion, not elsewhere classified: Secondary | ICD-10-CM | POA: Insufficient documentation

## 2023-12-13 DIAGNOSIS — Z1721 Progesterone receptor positive status: Secondary | ICD-10-CM | POA: Insufficient documentation

## 2023-12-13 DIAGNOSIS — C3432 Malignant neoplasm of lower lobe, left bronchus or lung: Secondary | ICD-10-CM | POA: Insufficient documentation

## 2023-12-13 DIAGNOSIS — I700 Atherosclerosis of aorta: Secondary | ICD-10-CM | POA: Insufficient documentation

## 2023-12-13 NOTE — Progress Notes (Signed)
 SHANTE ARCHAMBEAULT is here today for follow up post radiation to the lung.  Lung Side: Left, patient completed treatment on 10/01/23  Does the patient complain of any of the following: Pain: Denies pain to treatment field. Reports severe left shoulder pain. Rates 10/10.  Shortness of breath w/wo exertion: No Cough: Yes Hemoptysis: No  Pain with swallowing: No Swallowing/choking concerns: Yes choking on medication  Appetite: Poor  Energy Level: Low  Post radiation skin Changes: No     Additional comments if applicable:   BP (!) 140/50 (BP Location: Right Arm, Patient Position: Sitting, Cuff Size: Large)   Pulse 86   Temp (!) 97.4 F (36.3 C)   Resp 20   Ht 5' 6.5 (1.689 m)   SpO2 100%   BMI 19.28 kg/m

## 2023-12-13 NOTE — Progress Notes (Signed)
 Radiation Oncology         [phone] ________________________________  Name: [name] MRN: [id number]  Date: 12/13/2023  DOB: 04-02-1940  Follow-Up Visit Note  CC: [name], MD  [name], MD    ICD-10-CM   1. Malignant neoplasm of lower lobe bronchus, left [facility])  C34.32        Diagnosis: The encounter diagnosis was Malignant neoplasm of lower lobe bronchus, left (HCC).   Stage IIIA (cT2a, N2, M0) adenocarcinoma, non-small cell lung cancer, of the LLL   History of left sided breast cancer treated with surgery and adjuvant radiation in 2010.    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, ER+/PR-/HER2-; s/p left mastectomy in March of 2025  Interval Since Last Radiation: 2 months and 12 days   Intent: Curative  Radiation Treatment Dates: First Treatment Date: 2023-08-09 -- Last Treatment Date: 2023-10-01 Site/Dose/Technique/Mode:  Plan Name: Lung_L Site: Lung, Left Technique: 3D Mode: Photon Dose Per Fraction: 2 Gy Prescribed Dose (Delivered / Prescribed): 60 Gy / 60 Gy Prescribed Fxs (Delivered / Prescribed): 30 / 30   Plan Name: Lung_L_Bst Site: Lung, Left Technique: 3D Mode: Photon Dose Per Fraction: 2 Gy Prescribed Dose (Delivered / Prescribed): 6 Gy / 6 Gy Prescribed Fxs (Delivered / Prescribed): 3 / 3  Narrative:  The patient returns today for routine follow-up. She developed  fatigue, dysphagia, odynophagia, and anticipated skin changes in the treatment field. She also had a 10 lb weight loss in one week due to not being able to eat or drink adequately throughout her treatment. She was given viscous lidocaine  and IV fluids to helps with esophageal issues.   Her symptoms also led to her presenting to the ED on several occasions while undergoing treatment (encounter dates and details listed as follows):  -- Presented to the ED on 08/18 with fatigue and headaches since starting radiation therapy in  July. She attributed her fatigue to lack of p.o. intake as noted above. She also reported having one isolated episode of diarrhea that morning. A CT of the head was obtained in the setting of her headaches which was unremarkable overall. Labs were overall unremarkable as well other than UA showing evidence of UTI (started on Keflex ). She also received IV fluids.   -- Presented to the ED on 09/02 with ongoing pain/difficulty swallowing with radiating pain to her right chest. A chest x-ray was obtained which showed a decrease in size of the LLL pulmonary nodule and no acute cardiopulmonary findings. Labs were overall stable other than borderline hypocalcemia with calcium  of 10.5 and hemoglobin of 11.5 (improved from prior reading showing 10.8 two weeks prior). She was also evaluated by GI who advised her to continue using carafate  and viscous lidocaine  in addition to starting PPI and Nystatin .   Since completing radiation therapy, she was unfortunately hospitalized again from 10/30/23 through 11/02/79 for management of NSTEMI. Hospital course consisted of heart catheterization on 10/06; procedural findings included large dominant circumflex w/ moderately severe stenosis at the distal vessel involving the origin of the small L PDA branch, and small nondominant RCA w/ prox occlusion w/ collaterals present.   After being discharged, she returned to the ED on 11/06/23 with right sided weakness and slurred speech and was ultimately admitted for management of an acute CVA. CT of the head performed prior to admission demonstrated a large hypodensity on the right side, suggestive of a large territory infarct. An MRI of  the brain was also performed upon admission that showed multiple acute infarcts in a watershed distribution involving right frontal lobe right caudate head, posterior bilateral occipital lobes right lateral pons and posterior cerebellum, suggestive of a cardioembolic source. Her hospital course consisted  of a Heparin  drip which was later transitioned to Eliquis  per neurology and oncology request. She was discharged to IP rehab on 11/09/23 to address her functional deficits and remained there until 12/09/23. Per encounter notes, she did not meet the majority of her functional goals due to weakness and limited mobility. She was subsequently discharged to an SNF for ongoing skilled PT services.   - Of note: She endorsed ongoing swallowing issues during this admission and underwent barium swallow study on 11/22/23 which showed evidence of esophageal dysmotility as well as a small sliding type hiatal hernia.   In the setting of her persistent changes in mental status, she did have a repeat CT of the head performed while admitted to IP rehab on 12/02/23 which demonstrated no acute findings overall. Non-acute findings included: nonspecific periventricular and subcortical white matter hypoattenuation consistent with chronic microvascular ischemic changes; and paranasal sinus disease involving the right maxillary and left sphenoid sinuses, with frothy material in the left sphenoid sinus present.   She also underwent an EGD on 12/07/23 (while admitted to IP rehab) in the setting of anemia, heme positive stools, and her ongoing swallowing issues. Procedural findings included: a normal appearing esophagus, a 2 cm hital hernia, and a normal appearing stomach and duodenum. (No specimens were collected).   Of note: She continues to follow with Dr. Loretha in the setting of her breast cancer history and was most recently seen by Dr. Loretha for a routine follow-up visit on 10/29/23. She was noted to be tolerating anastrozole  well at that time and denied any side effects.   Today the patient denies any pain with swallowing, shortness of breath or chest pain. She notes 10/10 shoulder and buttock pain from a pressure sore, but denies pain elsewhere. She continues to experience difficulty with swallowing and is on a soft foods  diet. She was recently transferred to [facility] for PT and OT rehabilitation.                         Allergies:  is allergic to ciprofloxacin, crestor  [rosuvastatin ], lisinopril , and ofev  [nintedanib].  Meds: Current Outpatient Medications  Medication Sig Dispense Refill   acetaminophen  (TYLENOL ) 325 MG tablet Take 1-2 tablets (325-650 mg total) by mouth every 4 (four) hours as needed for mild pain (pain score 1-3).     albuterol  (VENTOLIN  HFA) 108 (90 Base) MCG/ACT inhaler Inhale 2 puffs into the lungs every 6 (six) hours as needed for wheezing or shortness of breath. 8.5 g 2   anastrozole  (ARIMIDEX ) 1 MG tablet Take 1 tablet (1 mg total) by mouth daily. 90 tablet 3   [Paused] apixaban  (ELIQUIS ) 2.5 MG TABS tablet Take 1 tablet (2.5 mg total) by mouth 2 (two) times daily. 60 tablet 3   aspirin  EC 81 MG tablet Take 1 tablet (81 mg total) by mouth daily. Swallow whole.     atorvastatin  (LIPITOR) 40 MG tablet Take 1 tablet (40 mg total) by mouth daily. 30 tablet 0   Baclofen 5 MG TABS Take 1 tablet (5 mg total) by mouth 2 (two) times daily as needed for muscle spasms.     diclofenac  Sodium (VOLTAREN ) 1 % GEL Apply 2 g topically 4 (four)  times daily. To left shoulder     diltiazem  (CARDIZEM  CD) 120 MG 24 hr capsule Take 1 capsule (120 mg total) by mouth daily. 90 capsule 3   escitalopram (LEXAPRO) 10 MG tablet Take 1 tablet (10 mg total) by mouth at bedtime.     feeding supplement (ENSURE PLUS HIGH PROTEIN) LIQD Take 237 mLs by mouth 2 (two) times daily between meals.     ferrous gluconate (FERGON) 324 MG tablet Take 1 tablet (324 mg total) by mouth daily with breakfast.     gabapentin  (NEURONTIN ) 100 MG capsule Take 1 capsule (100 mg total) by mouth at bedtime.     hydroxychloroquine  (PLAQUENIL ) 200 MG tablet Take 200 mg by mouth daily.     leptospermum manuka honey (MEDIHONEY) PSTE paste Apply 1 Application topically daily.     levothyroxine  (SYNTHROID ) 100 MCG tablet Take 100 mcg by  mouth daily.     lidocaine  (LIDODERM ) 5 % Place 1 patch onto the skin daily. Remove & Discard patch within 12 hours or as directed by MD     lip balm (CARMEX) ointment Apply topically as needed.     magnesium  hydroxide (MILK OF MAGNESIA) 400 MG/5ML suspension Take 15 mLs by mouth daily as needed for moderate constipation.     megestrol (MEGACE) 400 MG/10ML suspension Take 10 mLs (400 mg total) by mouth 2 (two) times daily.     methylphenidate (RITALIN) 10 MG tablet Take 1 tablet (10 mg total) by mouth 2 (two) times daily with breakfast and lunch. 10 tablet 0   Multiple Vitamin (MULTIVITAMIN) LIQD Take 15 mLs by mouth daily.     nystatin  (MYCOSTATIN ) 100000 UNIT/ML suspension Use as directed 5 mLs (500,000 Units total) in the mouth or throat 4 (four) times daily.     pantoprazole  (PROTONIX ) 40 MG tablet Take 1 tablet (40 mg total) by mouth 2 (two) times daily before a meal.     polyethylene glycol (MIRALAX  / GLYCOLAX ) 17 g packet Take 17 g by mouth daily.     senna-docusate (SENOKOT-S) 8.6-50 MG tablet Take 1 tablet by mouth at bedtime as needed for mild constipation.     sodium chloride  (OCEAN) 0.65 % SOLN nasal spray Place 1 spray into both nostrils as needed for congestion (nasal dryness). 50 mL 0   sodium chloride  1 g tablet Take 1 tablet (1 g total) by mouth 2 (two) times daily with a meal.     sucralfate  (CARAFATE ) 1 g tablet Take 1 g by mouth 4 (four) times daily.     traMADol  (ULTRAM ) 50 MG tablet Take 1 tablet (50 mg total) by mouth every 6 (six) hours as needed for severe pain (pain score 7-10). 5 tablet 0   No current facility-administered medications for this encounter.    Physical Findings: The patient is in no acute distress. Patient is alert and oriented.  height is 5' 6.5 (1.689 m). Her temperature is 97.4 F (36.3 C) (abnormal). Her blood pressure is 140/50 (abnormal) and her pulse is 86. Her respiration is 20 and oxygen  saturation is 100%. .    In general this is a fatigued  appearing elderly female in no acute distress. She's alert and oriented x4 and appropriate throughout the examination. Cardiopulmonary assessment is negative for acute distress and she exhibits normal effort. She is resting in a wheelchair.      Lab Findings: Lab Results  Component Value Date   WBC 7.2 12/09/2023   HGB 7.3 (L) 12/09/2023   HCT 21.8 (  L) 12/09/2023   MCV 90.1 12/09/2023   PLT 58 (L) 12/09/2023    Radiographic Findings: CT HEAD WO CONTRAST ( ) Result Date: 12/02/2023 EXAM: CT HEAD WITHOUT CONTRAST 12/02/2023 03:21:42 PM TECHNIQUE: CT of the head was performed without the administration of intravenous contrast. Automated exposure control, iterative reconstruction, and/or weight based adjustment of the mA/kV was utilized to reduce the radiation dose to as low as reasonably achievable. COMPARISON: 11/06/2023 CLINICAL HISTORY: Mental status change, persistent or worsening FINDINGS: BRAIN AND VENTRICLES: No acute hemorrhage. No evidence of acute infarct. Nonspecific periventricular and subcortical white matter hypoattenuation, consistent with chronic microvascular ischemic changes. Prominent ventricles suggesting underlying parenchymal volume loss. No extra-axial collection. No mass effect or midline shift. Atherosclerotic calcifications of carotid siphons. ORBITS: No acute abnormality. SINUSES: Mucosal thickening in right maxillary sinus and left sphenoid sinus. Frothy material in left sphenoid sinus. SOFT TISSUES AND SKULL: No acute soft tissue abnormality. No skull fracture. IMPRESSION: 1. No acute intracranial abnormality. 2. Prominent ventricles suggesting underlying parenchymal volume loss. 3. Nonspecific periventricular and subcortical white matter hypoattenuation consistent with chronic microvascular ischemic changes. 4. Paranasal sinus disease involving the right maxillary and left sphenoid sinuses, with frothy material in the left sphenoid sinus. Electronically signed by:  Lonni Necessary MD 12/02/2023 06:58 PM EST RP Workstation: [id number]   DG Cervical Spine 2 or 3 views Result Date: 11/27/2023 CLINICAL DATA:  Left neck pain EXAM: CERVICAL SPINE - 2 VIEW COMPARISON:  CT cervical spine dated 06/19/2023 FINDINGS: There is no evidence of cervical spine fracture or prevertebral soft tissue swelling. Straightening of the cervical lordosis. Again seen are multilevel degenerative changes of the cervical spine characterized by intervertebral disc space narrowing and disc osteophyte formation. IMPRESSION: 1. No acute fracture or traumatic listhesis. 2. Unchanged multilevel degenerative changes of the cervical spine. Electronically Signed   By: Limin  Xu M.D.   On: 11/27/2023 15:12   DG Shoulder Left Result Date: 11/24/2023 CLINICAL DATA:  Pain. EXAM: LEFT SHOULDER - 2+ VIEW COMPARISON:  None Available. FINDINGS: There is no evidence of fracture or dislocation. Mild acromioclavicular degenerative spurring. Subcortical cystic change in the lateral humeral head. No erosion or bony destructive change. Soft tissues are unremarkable. IMPRESSION: 1. Mild acromioclavicular degenerative change. 2. Subcortical cystic change in the lateral humeral head, can be seen with rotator cuff arthropathy. Electronically Signed   By: Andrea Gasman M.D.   On: 11/24/2023 15:02   DG ESOPHAGUS W SINGLE CM (SOL OR THIN BA) Result Date: 11/23/2023 CLINICAL DATA:  Inpatient being treated for recent CVA with c/o dysphagia. Exam limited by mobility and some views limited by kyphotic posture. EXAM: ESOPHAGUS/BARIUM SWALLOW/TABLET STUDY TECHNIQUE: Single contrast examination was performed using thin liquid barium. This exam was performed by Laymon Coast, NP, and was supervised and interpreted by Dr. Johann. Images obtained in LPO and RPO positioning. FLUOROSCOPY: Radiation Exposure Index (as provided by the fluoroscopic device): 15 mGy Kerma COMPARISON:  None Available. FINDINGS: Swallowing:  Appears normal. No vestibular penetration or aspiration seen. Pharynx: Unremarkable. Esophagus: Normal appearance. Esophageal motility: Esophageal dysmotility seen as tertiary contractions resulting in contrast stasis within the mid and distal esophagus. Hiatal Hernia: Small sliding type hiatal hernia seen. Gastroesophageal reflux: None visualized. Ingested 13 mm barium tablet: Passed without significant delay to the stomach. Other: None. IMPRESSION: 1.  Esophageal dysmotility. 2.  Small sliding type hiatal hernia. Electronically Signed   By: JONETTA Johann M.D.   On: 11/23/2023 12:17   DG Swallowing Func-Speech Pathology Result Date: 11/23/2023 Table  formatting from the original result was not included. Modified Barium Swallow Study Patient Details Name: [name] MRN: [id number] Date of Birth: 03/14/1940 Today's Date: 11/23/2023 HPI/PMH: HPI: TENEKA MALMBERG is a 83 y.o. 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       CT of the head revealed large hypodensity on the right concerning for large territory infarct.  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  Stroke consistent with embolic.  Patient apparently missed Eliquis  doses. Echo shows EF 60%, increased RVSP at 36.9 mmHg, mild MR, moderate AR. Continue aspirin , statin.  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 S/p radiation, on anastrozole .She is at risk for strokes due to her cancer history. Therapies are recommending intensive rehab. Clinical Impression: Patient presents with a functional oropharyngeal swallow.  Oral phase is remarkable for mild oral residuals and mildly prolonged mastication times (patient with missing dentition) however patient is able to clear w/ additional swallows or liquid wash. Pharyngeal phase is remarkable for complete epiglottic inversion, laryngeal vestibular closure and hyoid excursion. Noted x1 instance of trace penetration, however no other instances of penetration/aspiration across all consistencies tested. Full clearance of pharyngeal space. Patient unable to transit pill from oral cavity, therefore expectorated. Recommend continuation of D3/thin liquid diet with medications crushed in puree. Patient should receive physical and verbal assistance during meals to cue for head up posture. Factors that may increase risk of adverse event in presence of aspiration Noe & Lianne 2021): No data recorded Recommendations/Plan: Swallowing Evaluation Recommendations Swallowing Evaluation Recommendations Recommendations: PO diet PO Diet Recommendation: Dysphagia 3 (Mechanical soft); Thin liquids (Level 0) Liquid Administration via: Straw Medication Administration: Crushed with puree Supervision: Full assist for feeding Swallowing strategies  : Slow rate; Small bites/sips Postural changes: Position pt fully upright for meals Oral care recommendations: Oral care BID (2x/day) Treatment Plan Treatment Plan Treatment recommendations: Therapy as outlined in treatment plan below Follow-up recommendations: Follow physicians's recommendations for discharge plan and follow up therapies Functional status assessment: Patient has had a recent decline in their functional status and demonstrates the ability to make significant improvements in function in a reasonable and predictable amount of time. Treatment frequency: Min 2x/week Treatment duration: 2 weeks Interventions: Compensatory techniques; Patient/family education; Diet toleration management by SLP Recommendations Recommendations for follow up therapy are  one component of a multi-disciplinary discharge planning process, led by the attending physician.  Recommendations may be updated based on patient status, additional functional criteria and insurance authorization. Assessment: Orofacial Exam: Orofacial Exam Oral Cavity - Dentition: Edentulous Orofacial Anatomy: WFL Anatomy: Anatomy: WFL Boluses Administered: Boluses Administered Boluses Administered: Thin liquids (Level 0); Mildly thick liquids (Level 2, nectar thick); Puree; Solid  Oral Impairment Domain: Oral Impairment Domain Lip Closure: Interlabial escape, no progression to anterior lip Tongue control during bolus hold: Cohesive bolus between tongue to palatal seal Bolus preparation/mastication: Slow prolonged chewing/mashing with complete recollection Bolus transport/lingual motion: Brisk tongue motion Oral residue: Trace residue lining oral structures Location of oral residue : Tongue Initiation of pharyngeal swallow : Posterior laryngeal surface of the epiglottis  Pharyngeal Impairment Domain: Pharyngeal Impairment Domain Soft palate elevation: No bolus between soft palate (SP)/pharyngeal wall (PW)  Laryngeal elevation: Complete superior movement of thyroid  cartilage with complete approximation of arytenoids to epiglottic petiole Anterior hyoid excursion: Complete anterior movement Epiglottic movement: Complete inversion Laryngeal vestibule closure: Complete, no air/contrast in laryngeal vestibule Pharyngeal stripping wave : Present - complete Pharyngeal contraction (A/P view only): N/A Pharyngoesophageal segment opening: Complete distension and complete duration, no obstruction of flow Tongue base retraction: No contrast between tongue base and posterior pharyngeal wall (PPW) Pharyngeal residue: Trace residue within or on pharyngeal structures  Esophageal Impairment Domain: Esophageal Impairment Domain Esophageal clearance upright position: Complete clearance, esophageal coating Pill: Pill Consistency  administered: -- (expectorated) Penetration/Aspiration Scale Score: Penetration/Aspiration Scale Score 1.  Material does not enter airway: Puree; Solid; Mildly thick liquids (Level 2, nectar thick) 2.  Material enters airway, remains ABOVE vocal cords then ejected out: Thin liquids (Level 0) Compensatory Strategies: Compensatory Strategies Compensatory strategies: Yes Straw: Effective Effective Straw: Thin liquid (Level 0); Mildly thick liquid (Level 2, nectar thick)   General Information: No data recorded Diet Prior to this Study: Dysphagia 3 (mechanical soft); Thin liquids (Level 0)   No data recorded  Respiratory Status: WFL   Supplemental O2: None (Room air)   No data recorded Behavior/Cognition: Alert; Lethargic/Drowsy Self-Feeding Abilities: Able to self-feed Baseline vocal quality/speech: Normal Volitional Cough: Able to elicit Volitional Swallow: Able to elicit Exam Limitations: Poor positioning Goal Planning: Prognosis for improved oropharyngeal function: Good No data recorded No data recorded No data recorded Consulted and agree with results and recommendations: Patient; Nurse Pain: No data recorded End of Session: Start Time:No data recorded Stop Time: No data recorded Time Calculation:No data recorded Charges: No data recorded SLP visit diagnosis: SLP Visit Diagnosis: Dysphagia, unspecified (R13.10) Past Medical History: Past Medical History: Diagnosis Date  Acute bronchitis 12/17/2021  Acute cystitis 02/20/2019  Arthritis   Atrial flutter (HCC)   Breast cancer (HCC)   left  2009 and 2025  Diabetes mellitus without complication (HCC)   Dyspnea   mostly with exertion  GERD (gastroesophageal reflux disease)   History of radiation therapy   Left Lung-08/09/23-10/01/23- Dr. 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[facility])   Lupus   Pneumonia 07/31/2021 Past Surgical History: Past Surgical History: Procedure Laterality Date  BREAST BIOPSY Left 01/15/2023  US  LT BREAST BX W LOC DEV  1ST LESION IMG BX SPEC US  GUIDE 01/15/2023 [facility] MAMMOGRAPHY  BREAST LUMPECTOMY Left 2009  BRONCHIAL BIOPSY  06/29/2023  Procedure: BRONCHOSCOPY, WITH BIOPSY;  Surgeon: [name], MD;  Location: [facility] ENDOSCOPY;  Service: Pulmonary;;  BRONCHIAL BRUSHINGS  06/29/2023  Procedure: BRONCHOSCOPY, WITH BRUSH BIOPSY;  Surgeon: [name], MD;  Location: [facility] ENDOSCOPY;  Service: Pulmonary;;  BRONCHIAL NEEDLE ASPIRATION BIOPSY  06/29/2023  Procedure: BRONCHOSCOPY, WITH NEEDLE ASPIRATION BIOPSY;  Surgeon: [name], MD;  Location: [facility] ENDOSCOPY;  Service: Pulmonary;;  IR 3D INDEPENDENT WKST  05/29/2021  IR ANGIO INTRA EXTRACRAN SEL COM CAROTID INNOMINATE BILAT MOD SED  05/29/2021  IR ANGIO VERTEBRAL SEL VERTEBRAL BILAT MOD SED  05/29/2021  LEFT HEART CATH AND CORONARY ANGIOGRAPHY N/A 11/01/2023  Procedure: LEFT HEART CATH AND CORONARY ANGIOGRAPHY;  Surgeon: Wonda Sharper, MD;  Location: [facility] INVASIVE CV LAB;  Service: Cardiovascular;  Laterality: N/A;  SIMPLE MASTECTOMY WITH AXILLARY SENTINEL NODE BIOPSY Left 03/31/2023  Procedure: LEFT MASTECTOMY;  Surgeon: Vernetta Berg, MD;  Location: [facility] OR;  Service: General;  Laterality: Left;  LMA PEC BLOCK  VIDEO BRONCHOSCOPY WITH ENDOBRONCHIAL NAVIGATION Left 06/29/2023  Procedure: VIDEO BRONCHOSCOPY WITH ENDOBRONCHIAL  NAVIGATION;  Surgeon: [name], MD;  Location: [facility] ENDOSCOPY;  Service: Pulmonary;  Laterality: Left;  VIDEO BRONCHOSCOPY WITH ENDOBRONCHIAL ULTRASOUND Bilateral 06/29/2023  Procedure: BRONCHOSCOPY, WITH EBUS;  Surgeon: [name], MD;  Location: [facility] ENDOSCOPY;  Service: Pulmonary;  Laterality: Bilateral; Blaise FALCON [place] 11/23/2023, 10:22 AM  DG Chest 2 View Result Date: 11/19/2023 CLINICAL DATA:  Cough. EXAM: DG CHEST 2V COMPARISON:  Radiograph 10/30/2023, CT 06/19/2023 FINDINGS: Upper normal heart size. Stable mediastinal contours with aortic atherosclerosis. Scattered areas of parenchymal scarring. Peripheral left lower lobe opacity may be  related to treatment related changes, lung nodule on prior CT. Small left pleural effusion. No pulmonary edema. No pneumothorax. IMPRESSION: Peripheral left lower lobe opacity may be related to treatment related changes, lung nodule on prior CT. Small left pleural effusion. Electronically Signed   By: Andrea Gasman M.D.   On: 11/19/2023 20:15    Impression/Plans: Stage IIIA (cT2a, N2, M0) adenocarcinoma, non-small cell lung cancer, of the LLL; s/p definitive radiation completed on 10/01/2023   History of left sided breast cancer treated with surgery and adjuvant radiation in 2010.    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   She is fortunately not experiencing any discomfort with swallowing. She continues to have odynophagia which has been evaluated by [facility] and attributed to dysmotility issues. She is currently on a soft food diet and is working with OT at her assisted living facility for these issues.   She is experiencing left shoulder pain that she developed while hospitalized. Left shoulder X-ray showed no acute findings. Recommend Ultram  and Voltaren  gel PRN.   We discussed benefits of having palliative care a part of care. Patient's daughter states that the hospital's palliative care team is already seeing the patient.   Patient will continue to follow with Dr. Loretha for management of her breast and lung cancer. We will defer restaging imaging to Dr. Loretha. Radiation follow-up as needed.    25 minutes of total time was spent for this patient encounter, including preparation, face-to-face counseling with the patient and coordination of care, physical exam, and documentation of the encounter. ____________________________________    Leeroy Due, PA-C   This document serves as a record of services personally performed by Leeroy Due, PA-C. It was created on her behalf by Dorthy Fuse, a trained medical scribe. The creation of this record is based on the scribe's personal observations and the provider's statements to them. This document has been checked and approved by the attending provider.

## 2023-12-15 ENCOUNTER — Other Ambulatory Visit: Payer: Self-pay

## 2023-12-15 ENCOUNTER — Encounter (HOSPITAL_COMMUNITY): Payer: Self-pay

## 2023-12-15 ENCOUNTER — Emergency Department (HOSPITAL_COMMUNITY)

## 2023-12-15 ENCOUNTER — Inpatient Hospital Stay (HOSPITAL_COMMUNITY)
Admission: EM | Admit: 2023-12-15 | Discharge: 2023-12-21 | DRG: 871 | Disposition: A | Discharge status: Hospice | Source: Skilled Nursing Facility | Attending: Internal Medicine | Admitting: Internal Medicine

## 2023-12-15 DIAGNOSIS — J84112 Idiopathic pulmonary fibrosis: Secondary | ICD-10-CM | POA: Diagnosis present

## 2023-12-15 DIAGNOSIS — E43 Unspecified severe protein-calorie malnutrition: Secondary | ICD-10-CM | POA: Diagnosis present

## 2023-12-15 DIAGNOSIS — Z1722 Progesterone receptor negative status: Secondary | ICD-10-CM

## 2023-12-15 DIAGNOSIS — A419 Sepsis, unspecified organism: Principal | ICD-10-CM | POA: Diagnosis present

## 2023-12-15 DIAGNOSIS — J9811 Atelectasis: Secondary | ICD-10-CM | POA: Diagnosis present

## 2023-12-15 DIAGNOSIS — Z7189 Other specified counseling: Secondary | ICD-10-CM

## 2023-12-15 DIAGNOSIS — C3432 Malignant neoplasm of lower lobe, left bronchus or lung: Secondary | ICD-10-CM | POA: Diagnosis present

## 2023-12-15 DIAGNOSIS — J849 Interstitial pulmonary disease, unspecified: Secondary | ICD-10-CM | POA: Diagnosis present

## 2023-12-15 DIAGNOSIS — R6520 Severe sepsis without septic shock: Secondary | ICD-10-CM | POA: Diagnosis present

## 2023-12-15 DIAGNOSIS — Z803 Family history of malignant neoplasm of breast: Secondary | ICD-10-CM

## 2023-12-15 DIAGNOSIS — Z923 Personal history of irradiation: Secondary | ICD-10-CM

## 2023-12-15 DIAGNOSIS — I10 Essential (primary) hypertension: Secondary | ICD-10-CM | POA: Diagnosis present

## 2023-12-15 DIAGNOSIS — N3000 Acute cystitis without hematuria: Secondary | ICD-10-CM | POA: Diagnosis not present

## 2023-12-15 DIAGNOSIS — C3492 Malignant neoplasm of unspecified part of left bronchus or lung: Secondary | ICD-10-CM | POA: Diagnosis not present

## 2023-12-15 DIAGNOSIS — R64 Cachexia: Secondary | ICD-10-CM | POA: Diagnosis present

## 2023-12-15 DIAGNOSIS — D62 Acute posthemorrhagic anemia: Secondary | ICD-10-CM | POA: Diagnosis not present

## 2023-12-15 DIAGNOSIS — Z66 Do not resuscitate: Secondary | ICD-10-CM | POA: Diagnosis present

## 2023-12-15 DIAGNOSIS — Z7984 Long term (current) use of oral hypoglycemic drugs: Secondary | ICD-10-CM

## 2023-12-15 DIAGNOSIS — I480 Paroxysmal atrial fibrillation: Secondary | ICD-10-CM | POA: Diagnosis present

## 2023-12-15 DIAGNOSIS — Z888 Allergy status to other drugs, medicaments and biological substances status: Secondary | ICD-10-CM

## 2023-12-15 DIAGNOSIS — E860 Dehydration: Secondary | ICD-10-CM | POA: Diagnosis present

## 2023-12-15 DIAGNOSIS — Z7982 Long term (current) use of aspirin: Secondary | ICD-10-CM

## 2023-12-15 DIAGNOSIS — Z825 Family history of asthma and other chronic lower respiratory diseases: Secondary | ICD-10-CM

## 2023-12-15 DIAGNOSIS — R52 Pain, unspecified: Secondary | ICD-10-CM

## 2023-12-15 DIAGNOSIS — I4892 Unspecified atrial flutter: Secondary | ICD-10-CM | POA: Diagnosis present

## 2023-12-15 DIAGNOSIS — K219 Gastro-esophageal reflux disease without esophagitis: Secondary | ICD-10-CM | POA: Diagnosis present

## 2023-12-15 DIAGNOSIS — E872 Acidosis, unspecified: Secondary | ICD-10-CM | POA: Diagnosis present

## 2023-12-15 DIAGNOSIS — Z1732 Human epidermal growth factor receptor 2 negative status: Secondary | ICD-10-CM

## 2023-12-15 DIAGNOSIS — I5032 Chronic diastolic (congestive) heart failure: Secondary | ICD-10-CM | POA: Diagnosis present

## 2023-12-15 DIAGNOSIS — F419 Anxiety disorder, unspecified: Secondary | ICD-10-CM | POA: Diagnosis present

## 2023-12-15 DIAGNOSIS — I69351 Hemiplegia and hemiparesis following cerebral infarction affecting right dominant side: Secondary | ICD-10-CM

## 2023-12-15 DIAGNOSIS — I252 Old myocardial infarction: Secondary | ICD-10-CM

## 2023-12-15 DIAGNOSIS — R9431 Abnormal electrocardiogram [ECG] [EKG]: Secondary | ICD-10-CM | POA: Diagnosis present

## 2023-12-15 DIAGNOSIS — N390 Urinary tract infection, site not specified: Secondary | ICD-10-CM | POA: Diagnosis present

## 2023-12-15 DIAGNOSIS — D649 Anemia, unspecified: Secondary | ICD-10-CM | POA: Diagnosis present

## 2023-12-15 DIAGNOSIS — J189 Pneumonia, unspecified organism: Secondary | ICD-10-CM | POA: Diagnosis present

## 2023-12-15 DIAGNOSIS — J90 Pleural effusion, not elsewhere classified: Secondary | ICD-10-CM | POA: Diagnosis not present

## 2023-12-15 DIAGNOSIS — Z515 Encounter for palliative care: Secondary | ICD-10-CM

## 2023-12-15 DIAGNOSIS — Z853 Personal history of malignant neoplasm of breast: Secondary | ICD-10-CM

## 2023-12-15 DIAGNOSIS — R4182 Altered mental status, unspecified: Secondary | ICD-10-CM

## 2023-12-15 DIAGNOSIS — Z9012 Acquired absence of left breast and nipple: Secondary | ICD-10-CM

## 2023-12-15 DIAGNOSIS — I69322 Dysarthria following cerebral infarction: Secondary | ICD-10-CM

## 2023-12-15 DIAGNOSIS — Z711 Person with feared health complaint in whom no diagnosis is made: Secondary | ICD-10-CM

## 2023-12-15 DIAGNOSIS — Z85118 Personal history of other malignant neoplasm of bronchus and lung: Secondary | ICD-10-CM

## 2023-12-15 DIAGNOSIS — E119 Type 2 diabetes mellitus without complications: Secondary | ICD-10-CM

## 2023-12-15 DIAGNOSIS — I110 Hypertensive heart disease with heart failure: Secondary | ICD-10-CM | POA: Diagnosis present

## 2023-12-15 DIAGNOSIS — E039 Hypothyroidism, unspecified: Secondary | ICD-10-CM | POA: Diagnosis present

## 2023-12-15 DIAGNOSIS — R4589 Other symptoms and signs involving emotional state: Secondary | ICD-10-CM

## 2023-12-15 DIAGNOSIS — Z7901 Long term (current) use of anticoagulants: Secondary | ICD-10-CM

## 2023-12-15 DIAGNOSIS — Z8673 Personal history of transient ischemic attack (TIA), and cerebral infarction without residual deficits: Secondary | ICD-10-CM

## 2023-12-15 DIAGNOSIS — J439 Emphysema, unspecified: Secondary | ICD-10-CM | POA: Diagnosis present

## 2023-12-15 DIAGNOSIS — Z681 Body mass index (BMI) 19 or less, adult: Secondary | ICD-10-CM

## 2023-12-15 DIAGNOSIS — Z79811 Long term (current) use of aromatase inhibitors: Secondary | ICD-10-CM

## 2023-12-15 DIAGNOSIS — I2510 Atherosclerotic heart disease of native coronary artery without angina pectoris: Secondary | ICD-10-CM | POA: Diagnosis present

## 2023-12-15 DIAGNOSIS — Z993 Dependence on wheelchair: Secondary | ICD-10-CM

## 2023-12-15 DIAGNOSIS — R54 Age-related physical debility: Secondary | ICD-10-CM | POA: Diagnosis present

## 2023-12-15 DIAGNOSIS — Z1152 Encounter for screening for COVID-19: Secondary | ICD-10-CM

## 2023-12-15 DIAGNOSIS — D630 Anemia in neoplastic disease: Secondary | ICD-10-CM | POA: Diagnosis present

## 2023-12-15 DIAGNOSIS — G9341 Metabolic encephalopathy: Secondary | ICD-10-CM | POA: Diagnosis present

## 2023-12-15 DIAGNOSIS — E1169 Type 2 diabetes mellitus with other specified complication: Secondary | ICD-10-CM | POA: Diagnosis present

## 2023-12-15 DIAGNOSIS — E7800 Pure hypercholesterolemia, unspecified: Secondary | ICD-10-CM | POA: Diagnosis present

## 2023-12-15 DIAGNOSIS — J45909 Unspecified asthma, uncomplicated: Secondary | ICD-10-CM | POA: Diagnosis present

## 2023-12-15 DIAGNOSIS — J9601 Acute respiratory failure with hypoxia: Secondary | ICD-10-CM | POA: Diagnosis present

## 2023-12-15 DIAGNOSIS — R1310 Dysphagia, unspecified: Secondary | ICD-10-CM | POA: Diagnosis present

## 2023-12-15 DIAGNOSIS — Z881 Allergy status to other antibiotic agents status: Secondary | ICD-10-CM

## 2023-12-15 DIAGNOSIS — M329 Systemic lupus erythematosus, unspecified: Secondary | ICD-10-CM | POA: Diagnosis present

## 2023-12-15 DIAGNOSIS — J9312 Secondary spontaneous pneumothorax: Secondary | ICD-10-CM | POA: Diagnosis present

## 2023-12-15 DIAGNOSIS — Z79890 Hormone replacement therapy (postmenopausal): Secondary | ICD-10-CM

## 2023-12-15 DIAGNOSIS — Z79899 Other long term (current) drug therapy: Secondary | ICD-10-CM

## 2023-12-15 DIAGNOSIS — I351 Nonrheumatic aortic (valve) insufficiency: Secondary | ICD-10-CM | POA: Diagnosis present

## 2023-12-15 DIAGNOSIS — D696 Thrombocytopenia, unspecified: Secondary | ICD-10-CM | POA: Diagnosis present

## 2023-12-15 DIAGNOSIS — C50512 Malignant neoplasm of lower-outer quadrant of left female breast: Secondary | ICD-10-CM | POA: Diagnosis present

## 2023-12-15 DIAGNOSIS — R627 Adult failure to thrive: Secondary | ICD-10-CM | POA: Diagnosis present

## 2023-12-15 DIAGNOSIS — J918 Pleural effusion in other conditions classified elsewhere: Secondary | ICD-10-CM | POA: Diagnosis present

## 2023-12-15 LAB — I-STAT CHEM 8, ED
BUN: 14 mg/dL (ref 8–23)
Calcium, Ion: 1.24 mmol/L (ref 1.15–1.40)
Chloride: 109 mmol/L (ref 98–111)
Creatinine, Ser: 0.7 mg/dL (ref 0.44–1.00)
Glucose, Bld: 94 mg/dL (ref 70–99)
HCT: 28 % — ABNORMAL LOW (ref 36.0–46.0)
Hemoglobin: 9.5 g/dL — ABNORMAL LOW (ref 12.0–15.0)
Potassium: 3.5 mmol/L (ref 3.5–5.1)
Sodium: 139 mmol/L (ref 135–145)
TCO2: 17 mmol/L — ABNORMAL LOW (ref 22–32)

## 2023-12-15 LAB — CBC WITH DIFFERENTIAL/PLATELET
Abs Immature Granulocytes: 0.07 K/uL (ref 0.00–0.07)
Basophils Absolute: 0 K/uL (ref 0.0–0.1)
Basophils Relative: 0 %
Eosinophils Absolute: 0 K/uL (ref 0.0–0.5)
Eosinophils Relative: 0 %
HCT: 27.5 % — ABNORMAL LOW (ref 36.0–46.0)
Hemoglobin: 8.6 g/dL — ABNORMAL LOW (ref 12.0–15.0)
Immature Granulocytes: 1 %
Lymphocytes Relative: 20 %
Lymphs Abs: 1.9 K/uL (ref 0.7–4.0)
MCH: 30.2 pg (ref 26.0–34.0)
MCHC: 31.3 g/dL (ref 30.0–36.0)
MCV: 96.5 fL (ref 80.0–100.0)
Monocytes Absolute: 0.6 K/uL (ref 0.1–1.0)
Monocytes Relative: 6 %
Neutro Abs: 7 K/uL (ref 1.7–7.7)
Neutrophils Relative %: 73 %
Platelets: 67 K/uL — ABNORMAL LOW (ref 150–400)
RBC: 2.85 MIL/uL — ABNORMAL LOW (ref 3.87–5.11)
RDW: 19.1 % — ABNORMAL HIGH (ref 11.5–15.5)
WBC: 9.6 K/uL (ref 4.0–10.5)
nRBC: 0 % (ref 0.0–0.2)

## 2023-12-15 LAB — COMPREHENSIVE METABOLIC PANEL WITH GFR
ALT: 32 U/L (ref 0–44)
AST: 47 U/L — ABNORMAL HIGH (ref 15–41)
Albumin: 3.3 g/dL — ABNORMAL LOW (ref 3.5–5.0)
Alkaline Phosphatase: 84 U/L (ref 38–126)
Anion gap: 13 (ref 5–15)
BUN: 15 mg/dL (ref 8–23)
CO2: 17 mmol/L — ABNORMAL LOW (ref 22–32)
Calcium: 9.1 mg/dL (ref 8.9–10.3)
Chloride: 107 mmol/L (ref 98–111)
Creatinine, Ser: 0.72 mg/dL (ref 0.44–1.00)
GFR, Estimated: >60 mL/min (ref >60)
Glucose, Bld: 95 mg/dL (ref 70–99)
Potassium: 3.5 mmol/L (ref 3.5–5.1)
Sodium: 137 mmol/L (ref 135–145)
Total Bilirubin: 1 mg/dL (ref 0.0–1.2)
Total Protein: 6.9 g/dL (ref 6.5–8.1)

## 2023-12-15 LAB — URINALYSIS, ROUTINE W REFLEX MICROSCOPIC
Bilirubin Urine: NEGATIVE
Glucose, UA: 50 mg/dL — AB
Hgb urine dipstick: NEGATIVE
Ketones, ur: 5 mg/dL — AB
Leukocytes,Ua: NEGATIVE
Nitrite: NEGATIVE
Protein, ur: 100 mg/dL — AB
Specific Gravity, Urine: 1.02 (ref 1.005–1.030)
pH: 5 (ref 5.0–8.0)

## 2023-12-15 LAB — I-STAT CG4 LACTIC ACID, ED: Lactic Acid, Venous: 1.8 mmol/L (ref 0.5–1.9)

## 2023-12-15 LAB — BLOOD GAS, VENOUS
Acid-base deficit: 10.7 mmol/L — ABNORMAL HIGH (ref 0.0–2.0)
Bicarbonate: 13.3 mmol/L — ABNORMAL LOW (ref 20.0–28.0)
O2 Saturation: 87.3 %
Patient temperature: 37
pCO2, Ven: 23 mmHg — ABNORMAL LOW (ref 44–60)
pH, Ven: 7.37 (ref 7.25–7.43)
pO2, Ven: 55 mmHg — ABNORMAL HIGH (ref 32–45)

## 2023-12-15 LAB — AMMONIA: Ammonia: 15 umol/L (ref 9–35)

## 2023-12-15 LAB — TSH: TSH: 4.09 u[IU]/mL (ref 0.350–4.500)

## 2023-12-15 MED ORDER — THIAMINE HCL 100 MG/ML IJ SOLN
100.0000 mg | Freq: Every day | INTRAMUSCULAR | Status: DC
  Administered 2023-12-15 – 2023-12-18 (×4): 100 mg via INTRAVENOUS
  Filled 2023-12-15 (×4): qty 2

## 2023-12-15 MED ORDER — LACTATED RINGERS IV BOLUS
500.0000 mL | Freq: Once | INTRAVENOUS | Status: AC
  Administered 2023-12-15: 500 mL via INTRAVENOUS

## 2023-12-15 MED ORDER — SODIUM CHLORIDE 0.9 % IV SOLN
1.0000 g | Freq: Once | INTRAVENOUS | Status: AC
  Administered 2023-12-15: 1 g via INTRAVENOUS
  Filled 2023-12-15: qty 10

## 2023-12-15 MED ORDER — HYDROMORPHONE HCL 1 MG/ML IJ SOLN
0.5000 mg | Freq: Once | INTRAMUSCULAR | Status: AC
  Administered 2023-12-15: 0.5 mg via INTRAVENOUS
  Filled 2023-12-15: qty 1

## 2023-12-15 MED ORDER — SODIUM CHLORIDE 0.9% FLUSH
10.0000 mL | Freq: Three times a day (TID) | INTRAVENOUS | Status: DC
  Administered 2023-12-15 – 2023-12-19 (×10): 10 mL via INTRAPLEURAL

## 2023-12-15 NOTE — Assessment & Plan Note (Signed)
 Does not appear to be fluid overloaded at this time

## 2023-12-15 NOTE — Assessment & Plan Note (Signed)
 Obtain anemia panel  Transfuse for Hg <7 , rapidly dropping or  if symptomatic

## 2023-12-15 NOTE — Subjective & Objective (Addendum)
 Patient history of lung cancer brought from [facility] today secondary to decrease in mental status for the past 3 days only responding to grunting at her baseline she is alert and oriented but now not following commands has been having increased shortness of breath and coughing past 2 days Known history of stroke with Right side weakness In ER noted to be in respiratory distress started on biPAP CXR showed Large left lural effusion  Chest tube placed in ER and 2 L of fluid produced,  Lung re expended and patient respiratory effort improved but not the mental status  Pulmonology consulted  Patient is to be admitted to medicine service with pulmonology consult

## 2023-12-15 NOTE — Progress Notes (Signed)
 Called to see patient for Pulmonary consult in the am. Indication: Pleural effusion and chest tube placed in the ED. Pulmonary team will see in the am. Please call me if you have any questions overnight.  Tamela Stakes, MD  Attending Physician, Critical Care Medicine [facility] See Amion for pager If no response to pager, please call [phone] until 7pm After 7pm, Please call E-link [phone]

## 2023-12-15 NOTE — Progress Notes (Signed)
   12/15/23 1935  BiPAP/CPAP/SIPAP  $ Face Mask Small Yes  BiPAP/CPAP/SIPAP Pt Type Adult  BiPAP/CPAP/SIPAP V60  Mask Type Full face mask  Mask Size Small  Set Rate 12 breaths/min  Respiratory Rate 34 breaths/min  IPAP 12 cmH20  EPAP 6 cmH2O  FiO2 (%) 40 %  Minute Ventilation 14.6  Leak 4  Peak Inspiratory Pressure (PIP) 13  Tidal Volume (Vt) 433  Patient Home Machine No  Patient Home Mask No  Patient Home Tubing No  Auto Titrate No  Press High Alarm 30 cmH2O  Press Low Alarm 5 cmH2O  CPAP/SIPAP surface wiped down Yes  Device Plugged into RED Power Outlet Yes  BiPAP/CPAP /SiPAP Vitals  Bilateral Breath Sounds Fine crackles

## 2023-12-15 NOTE — Assessment & Plan Note (Signed)
 Unclear if underlying infectious process will hold off on Plaquenil  and prednisone  for tonight until pleural fluid results are back

## 2023-12-15 NOTE — Assessment & Plan Note (Signed)
 Lung cancer failure new pleural effusion appreciate pulmonology consult will need to send message to oncologist the patient has been admitted

## 2023-12-15 NOTE — ED Triage Notes (Signed)
 Pt BIB EMS from [facility] due to decrease in mental status for the last 3 days, worst today is only responding by grunting. Pt family reports pt is baseline AAOx4, on arrival pt Alert, Awake, but not oriented or responding to any commands. Family also reports cough for a few days. Hx of CVA, right side weakness at baseline. Possible Sepsis.  CBG 108 18 ga IV Let forearm 200 mL LR RR 24 HR 110 BP 140/60 SpO2 97% RA

## 2023-12-15 NOTE — Assessment & Plan Note (Addendum)
 Restart Eliquis  if okay per pulmonology Continue Cardizem  if able to tolerate p.o. hold for right now

## 2023-12-15 NOTE — H&P (Incomplete)
 [name] FMW:[id number] DOB: [birth year] DOA: 12/15/2023     PCP: [name], MD   Outpatient Specialists:  CARDS:  Dr. Gordy Bergamo, MD  RadOnc Dr. Shannon  Oncology   Dr. Loretha Berke Dr. [place]clair  Patient arrived to ER on 12/15/23 at 1518 Referred by Attending Randol Simmonds, MD   Patient coming from:    From facility   SNF,      Chief Complaint:   Chief Complaint  Patient presents with   Altered Mental Status    HPI: [name] is a 83 y.o. 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, atrial flutter, diabetes, GERD, hypertension, hypothyroidism, history of lupus    Presented with confusion respiratory distress Patient history of lung cancer brought from [facility] today secondary to decrease in mental status for the past 3 days only responding to grunting at her baseline she is alert and oriented but now not following commands has been having increased shortness of breath and coughing past 2 days Known history of stroke with Right side weakness In ER noted to be in respiratory distress started on biPAP CXR showed Large left lural effusion  Chest tube placed in ER and 2 L of fluid produced,  Lung re expended and patient respiratory effort improved but not the mental status  Pulmonology consulted  Patient is to be admitted to medicine service with pulmonology consult Patient has been getting radiation therapy for her lung cancer she has been having esophagitis resulting in difficulty eating and drinking resulting malnutrition She has known history of breast cancer for which she takes anastrozole  status post mastectomy in the right She is wheelchair-bound Denies significant ETOH intake   Does not smoke      Regarding pertinent Chronic problems:    Hyperlipidemia - on statins Lipitor (atorvastatin )   Lipid Panel     Component Value Date/Time   CHOL 139 11/07/2023 0653   TRIG 103 11/07/2023 0653   HDL 44 11/07/2023 0653   CHOLHDL 3.2 11/07/2023 0653   VLDL 21 11/07/2023 0653   LDLCALC 74 11/07/2023 0653     HTN on cardizem , lasix, cozaar , spironolactone   Lupus on Plaquenil , prednisone     last echo  Recent Results (from the past 56199 hours)  ECHOCARDIOGRAM COMPLETE   Collection Time: 11/07/23 10:21 AM  Result Value   Weight 1,920   Height 67   BP 147/77   S' Lateral 2.00   AR max vel 2.54   AV Peak grad 6.7   Ao pk vel 1.29   Area-P 1/2 3.30   Est EF 60 - 65%   Narrative      ECHOCARDIOGRAM REPORT        1. Left ventricular ejection fraction, by estimation, is 60 to 65%. The left ventricle has normal function. The left ventricle has no regional wall motion abnormalities. There is moderate asymmetric left ventricular hypertrophy of the septal and basal  segments. Left ventricular diastolic parameters are indeterminate.  2. Right ventricular systolic function is normal. The right ventricular size is normal. There is mildly elevated pulmonary artery systolic pressure. The estimated right ventricular systolic pressure is 36.9 mmHg.  3. The mitral valve is degenerative. There is nodular thickening at the leatlet tips and loose chordal structure as well. Mild mitral valve regurgitation.  4. The aortic valve is tricuspid. Aortic valve  regurgitation is moderate.  5. The inferior vena cava is normal in size with greater than 50% respiratory variability, suggesting right atrial pressure of 3 mmHg.               DM 2 -  Lab Results  Component Value Date   HGBA1C 6.0 (H) 11/06/2023   , diet controlled   Hypothyroidism:   Lab Results  Component Value Date   TSH 4.090 12/15/2023   on synthroid       Hx of CVA -  with  residual deficits on  Eliquis    A. Fib -   atrial fibrillation CHA2DS2 vas score    9       current  on anticoagulation with  Eliquis ,            -  Rate control:  Currently controlled with  Diltiazem ,           Chronic anemia - baseline hg Hemoglobin &     Cancer: New diagnosis of lung cancer followed by Dr. Benjamen has been getting radiation therapy     While in ER: Clinical Course as of 12/15/23 2139  Wed Dec 15, 2023  1900 Head CT without acute abnormality. [JK]  1901 Chest x-ray shows new large left pleural effusion.  Patient also has patchy opacities in the right lung base [JK]  1901 CBC WITH DIFFERENTIAL(!) CBC normal.  TSH normal.  Lactic acid level normal.  Metabolic panel normal. [JK]  1901 Blood gas, venous(!) No acidosis [JK]  1901 Ammonia Ammonia normal [JK]  1921 CXR shows large  [JK]  1932 Notified that patient's breathing is acutely worse.  Patient's respiratory rate is in the 40s now.  Will start on BiPAP.  I have reviewed her results that did show she has a large pleural effusion.  Family is at bedside and discussed these results with them.  I did also review the patient's healthcare power of attorney, living will.  She is DNR and also stipulates no intubation.  Will place a chest tube [JK]  2051 Chest tube drainage shows almost 2000 cc of fluid removed [JK]  2103 Case discussed with Dr Silvester [JK]  2135 Case discussed with Dr Gatha.  Pulmonary will see pt in am [JK]    Clinical Course User Index [JK] Randol Simmonds, MD         Lab Orders         Blood culture (routine x 2)         Body fluid culture w Gram Stain         Comprehensive metabolic panel         CBC WITH DIFFERENTIAL         Urinalysis, Routine w reflex microscopic -Urine, Catheterized         TSH         Ammonia         Blood gas, venous         Body fluid cell count with differential         pH, Body Fluid [as Miscellaneous test (send-out)]         I-stat chem 8, ED (not at [facility], [facility] or [facility])         I-Stat Lactic Acid, ED      CT HEAD . 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  CXR - New large  left pleural effusion. 2. Patchy opacities in the right lung base may represent atelectasis or infection.  Repeat 1. New left pigtail chest tube in appropriate position with near-complete resolution of prior left pleural effusion. Possible trace left apical pneumothorax. 2. Residual left basilar atelectasis/consolidation.  Following Medications were ordered in ER: Medications  sodium chloride  flush (NS) 0.9 % injection 10 mL (has no administration in time range)  lactated ringers  bolus 500 mL (500 mLs Intravenous New Bag/Given 12/15/23 1759)  HYDROmorphone  (DILAUDID ) injection 0.5 mg (0.5 mg Intravenous Given 12/15/23 2031)    _______________________________________________________ ER Provider Called:     Pulmonology   Dr.Shah  They Recommend admit to medicine   Will see in AM        ED Triage Vitals [12/15/23 1546]  Encounter Vitals Group     BP (!) 162/66     Girls Systolic BP Percentile      Girls Diastolic BP Percentile      Boys Systolic BP Percentile      Boys Diastolic BP Percentile      Pulse Rate 100     Resp (!) 24     Temp 97.8 F (36.6 C)     Temp Source Oral     SpO2 98 %     Weight      Height      Head Circumference      Peak Flow      Pain Score      Pain Loc      Pain Education      Exclude from Growth Chart   UFJK(75)@     _________________________________________ Significant initial  Findings: Abnormal Labs Reviewed  COMPREHENSIVE METABOLIC PANEL WITH GFR - Abnormal; Notable for the following components:      Result Value   CO2 17 (*)    Albumin 3.3 (*)    AST 47 (*)    All other components within normal limits  CBC WITH DIFFERENTIAL/PLATELET - Abnormal; Notable for the following components:   RBC 2.85 (*)    Hemoglobin 8.6 (*)    HCT 27.5 (*)    RDW 19.1 (*)    Platelets 67 (*)    All other components within normal limits  URINALYSIS, ROUTINE W REFLEX MICROSCOPIC - Abnormal; Notable for the following components:   Color, Urine AMBER  (*)    APPearance CLOUDY (*)    Glucose, UA 50 (*)    Ketones, ur 5 (*)    Protein, ur 100 (*)    Bacteria, UA RARE (*)    All other components within normal limits  BLOOD GAS, VENOUS - Abnormal; Notable for the following components:   pCO2, Ven 23 (*)    pO2, Ven 55 (*)    Bicarbonate 13.3 (*)    Acid-base deficit 10.7 (*)    All other components within normal limits  I-STAT CHEM 8, ED - Abnormal; Notable for the following components:   TCO2 17 (*)    Hemoglobin 9.5 (*)    HCT 28.0 (*)    All other components within normal limits      _________________________ Troponin ***ordered Cardiac Panel (last 3 results) No results for input(s): CKTOTAL, CKMB, TROPONINIHS, RELINDX in the last 72 hours.   ECG: Ordered Personally reviewed and interpreted by me showing: HR : *** Rhythm: *NSR, Sinus tachycardia * A.fib. W RVR, RBBB, LBBB, Paced Ischemic changes*nonspecific changes, no evidence of ischemic changes QTC*  BNP (last 3 results) No results for input(s): BNP in the last 8760 hours.    No results for input(s): DDIMER, FERRITIN, LDH, CRP in the  last 72 hours.    ____________________ This patient meets SIRS Criteria and may be septic. SIRS = Systemic Inflammatory Response Syndrome  Order a lactic acid level if needed AND/OR Initiate the sepsis protocol with the attached order set OR Click Treating Associated Infection or Illness if the patient is being treated for an infection that is a known cause of these abnormalities     The recent clinical data is shown below. Vitals:   12/15/23 1815 12/15/23 1835 12/15/23 1924 12/15/23 1930  BP: (!) 154/63   (!) 158/70  Pulse: (!) 101   (!) 139  Resp: (!) 25   (!) 34  Temp:  97.8 F (36.6 C) 98.3 F (36.8 C)   TempSrc:   Rectal   SpO2: 90%   100%        WBC     Component Value Date/Time   WBC 9.6 12/15/2023 1702   LYMPHSABS 1.9 12/15/2023 1702   LYMPHSABS 1.7 04/25/2022 1223   MONOABS 0.6  12/15/2023 1702   EOSABS 0.0 12/15/2023 1702   EOSABS 0.1 04/25/2022 1223   BASOSABS 0.0 12/15/2023 1702   BASOSABS 0.0 04/25/2022 1223        Lactic Acid, Venous    Component Value Date/Time   LATICACIDVEN 1.8 12/15/2023 1713     Lactic Acid, Venous    Component Value Date/Time   LATICACIDVEN 1.8 12/15/2023 1713    Procalcitonin *** Ordered      UA *** no evidence of UTI  ***Pending ***not ordered   Urine analysis:    Component Value Date/Time   COLORURINE AMBER (A) 12/15/2023 2041   APPEARANCEUR CLOUDY (A) 12/15/2023 2041   LABSPEC 1.020 12/15/2023 2041   PHURINE 5.0 12/15/2023 2041   GLUCOSEU 50 (A) 12/15/2023 2041   HGBUR NEGATIVE 12/15/2023 2041   BILIRUBINUR NEGATIVE 12/15/2023 2041   BILIRUBINUR negative 10/19/2023 1549   KETONESUR 5 (A) 12/15/2023 2041   PROTEINUR 100 (A) 12/15/2023 2041   UROBILINOGEN 0.2 10/19/2023 1549   UROBILINOGEN 0.2 06/20/2014 1842   NITRITE NEGATIVE 12/15/2023 2041   LEUKOCYTESUR NEGATIVE 12/15/2023 2041    Results for orders placed or performed during the hospital encounter of 11/09/23  Urine Culture     Status: Abnormal   Collection Time: 11/21/23  7:55 AM   Specimen: Urine, Random  Result Value Ref Range Status   Specimen Description URINE, RANDOM  Final   Special Requests   Final    NONE Reflexed from [id number] Performed at [facility], [address], [place], [place] [address]    Culture >=100,000 COLONIES/mL ESCHERICHIA COLI (A)  Final   Report Status 11/23/2023 FINAL  Final   Organism ID, Bacteria ESCHERICHIA COLI (A)  Final      Susceptibility   Escherichia coli - MIC*    AMPICILLIN >=32 RESISTANT Resistant     CEFAZOLIN  (URINE) Value in next row Sensitive      <=1 SENSITIVEThis is a modified FDA-approved test that has been validated and its performance characteristics determined by the reporting laboratory.  This laboratory is certified under the Clinical Laboratory Improvement Amendments CLIA as qualified  to perform high complexity clinical laboratory testing.    CEFEPIME Value in next row Sensitive      <=1 SENSITIVEThis is a modified FDA-approved test that has been validated and its performance characteristics determined by the reporting laboratory.  This laboratory is certified under the Clinical Laboratory Improvement Amendments CLIA as qualified to perform high complexity clinical laboratory testing.  ERTAPENEM Value in next row Sensitive      <=1 SENSITIVEThis is a modified FDA-approved test that has been validated and its performance characteristics determined by the reporting laboratory.  This laboratory is certified under the Clinical Laboratory Improvement Amendments CLIA as qualified to perform high complexity clinical laboratory testing.    CEFTRIAXONE  Value in next row Sensitive      <=1 SENSITIVEThis is a modified FDA-approved test that has been validated and its performance characteristics determined by the reporting laboratory.  This laboratory is certified under the Clinical Laboratory Improvement Amendments CLIA as qualified to perform high complexity clinical laboratory testing.    CIPROFLOXACIN Value in next row Sensitive      <=1 SENSITIVEThis is a modified FDA-approved test that has been validated and its performance characteristics determined by the reporting laboratory.  This laboratory is certified under the Clinical Laboratory Improvement Amendments CLIA as qualified to perform high complexity clinical laboratory testing.    GENTAMICIN Value in next row Sensitive      <=1 SENSITIVEThis is a modified FDA-approved test that has been validated and its performance characteristics determined by the reporting laboratory.  This laboratory is certified under the Clinical Laboratory Improvement Amendments CLIA as qualified to perform high complexity clinical laboratory testing.    NITROFURANTOIN Value in next row Sensitive      <=1 SENSITIVEThis is a modified FDA-approved test that has  been validated and its performance characteristics determined by the reporting laboratory.  This laboratory is certified under the Clinical Laboratory Improvement Amendments CLIA as qualified to perform high complexity clinical laboratory testing.    TRIMETH/SULFA Value in next row Sensitive      <=1 SENSITIVEThis is a modified FDA-approved test that has been validated and its performance characteristics determined by the reporting laboratory.  This laboratory is certified under the Clinical Laboratory Improvement Amendments CLIA as qualified to perform high complexity clinical laboratory testing.    AMPICILLIN/SULBACTAM Value in next row Sensitive      <=1 SENSITIVEThis is a modified FDA-approved test that has been validated and its performance characteristics determined by the reporting laboratory.  This laboratory is certified under the Clinical Laboratory Improvement Amendments CLIA as qualified to perform high complexity clinical laboratory testing.    PIP/TAZO Value in next row Sensitive      <=4 SENSITIVEThis is a modified FDA-approved test that has been validated and its performance characteristics determined by the reporting laboratory.  This laboratory is certified under the Clinical Laboratory Improvement Amendments CLIA as qualified to perform high complexity clinical laboratory testing.    MEROPENEM Value in next row Sensitive      <=4 SENSITIVEThis is a modified FDA-approved test that has been validated and its performance characteristics determined by the reporting laboratory.  This laboratory is certified under the Clinical Laboratory Improvement Amendments CLIA as qualified to perform high complexity clinical laboratory testing.    * >=100,000 COLONIES/mL ESCHERICHIA COLI    ABX started Antibiotics Given (last 72 hours)     None        Susceptibility data from last 90 days. Collected Specimen Info Organism AMPICILLIN AMPICILLIN/SULBACTAM CEFAZOLIN  (URINE) CEFEPIME CEFTRIAXONE   Ciprofloxacin Ertapenem Gentamicin Susc lslt Meropenem Nitrofurantoin Susc lslt Piperacillin + Tazobactam  11/21/23 Urine, Random Escherichia coli  R  S S  S  S  S  S  S  S  S S  10/19/23 Urine, Clean Catch Escherichia coli  R  S S  S  S  S  S  S  S  S S   Collected Specimen Info Organism Trimethoprim/Sulfa  11/21/23 Urine, Random Escherichia coli  S  10/19/23 Urine, Clean Catch Escherichia coli  S     ________________________________________________________________  Arterial ***Venous  Blood Gas result:  pH *** pCO2 ***; pO2 ***;     %O2 Sat ***.  ABG    Component Value Date/Time   HCO3 13.3 (L) 12/15/2023 1702   TCO2 17 (L) 12/15/2023 1713   ACIDBASEDEF 10.7 (H) 12/15/2023 1702   O2SAT 87.3 12/15/2023 1702       __________________________________________________________ Recent Labs  Lab 12/09/23 0517 12/15/23 1702 12/15/23 1713  NA 131* 137 139  K 3.5 3.5 3.5  CO2 17* 17*  --   GLUCOSE 95 95 94  BUN 13 15 14   CREATININE 0.75 0.72 0.70  CALCIUM  8.3* 9.1  --     Cr  * stable,  Up from baseline see below Lab Results  Component Value Date   CREATININE 0.70 12/15/2023   CREATININE 0.72 12/15/2023   CREATININE 0.75 12/09/2023    Recent Labs  Lab 12/15/23 1702  AST 47*  ALT 32  ALKPHOS 84  BILITOT 1.0  PROT 6.9  ALBUMIN 3.3*   Lab Results  Component Value Date   CALCIUM  9.1 12/15/2023          Plt: Lab Results  Component Value Date   PLT 67 (L) 12/15/2023         Recent Labs  Lab 12/09/23 0517 12/15/23 1702 12/15/23 1713  WBC 7.2 9.6  --   NEUTROABS  --  7.0  --   HGB 7.3* 8.6* 9.5*  HCT 21.8* 27.5* 28.0*  MCV 90.1 96.5  --   PLT 58* 67*  --     HG/HCT * stable,  Down *Up from baseline see below    Component Value Date/Time   HGB 9.5 (L) 12/15/2023 1713   HGB 11.4 (L) 08/31/2023 1349   HGB 11.7 01/07/2023 1439   HCT 28.0 (L) 12/15/2023 1713   HCT 36.4 01/07/2023 1439   MCV 96.5 12/15/2023 1702   MCV 89 01/07/2023 1439      No  results for input(s): LIPASE, AMYLASE in the last 168 hours. Recent Labs  Lab 12/15/23 1702  AMMONIA 15      _______________________________________________ Hospitalist was called for admission for *** Pleural effusion ***  Altered mental status, unspecified altered mental status type ***  Malignant neoplasm of left lung, unspecified part of lung (HCC) ***    The following Work up has been ordered so far:  Orders Placed This Encounter  Procedures   CHEST TUBE INSERTION   Critical Care   Blood culture (routine x 2)   Body fluid culture w Gram Stain   CT HEAD WO CONTRAST   DG Chest Port 1 View   DG Chest Portable 1 View   Comprehensive metabolic panel   CBC WITH DIFFERENTIAL   Urinalysis, Routine w reflex microscopic -Urine, Catheterized   TSH   Ammonia   Blood gas, venous   Body fluid cell count with differential   pH, Body Fluid [as Miscellaneous test (send-out)]   ED Cardiac monitoring   Initiate Carrier Fluid Protocol   In and Out Cath   Chest tube tray   Chest tube set 20 cm suction unless otherwise specified by MD   Patient may travel on water seal   Notify physician (specify) of uncontrolled pain at chest tube insertion site or worsening respiratory status   RN  to change chest tube dressing   RN to record chest tube output at least q shift or more frequently if directed by provider   Do not attempt resuscitation (DNR)- Limited -Do Not Intubate (DNI)   Consult to intensivist   Consult to hospitalist   ED Pulse oximetry, continuous   Bipap   I-stat chem 8, ED (not at [facility], [facility] or [facility])   I-Stat Lactic Acid, ED   ED EKG   EKG 12-Lead   Insert peripheral IV     OTHER Significant initial  Findings:  labs showing:     DM  labs:  HbA1C: Recent Labs    03/26/23 1345 11/06/23 1433  HGBA1C 6.8* 6.0*       CBG (last 3)  No results for input(s): GLUCAP in the last 72 hours.        Cultures:    Component Value Date/Time   SDES URINE,  RANDOM 11/21/2023 0755   SPECREQUEST  11/21/2023 0755    NONE Reflexed from [id number] Performed at [facility], [address], [place], [place] [address]    CULT >=100,000 COLONIES/mL ESCHERICHIA COLI (A) 11/21/2023 0755   REPTSTATUS 11/23/2023 FINAL 11/21/2023 0755     Radiological Exams on Admission: DG Chest Portable 1 View Result Date: 12/15/2023 EXAM: 1 VIEW(S) XRAY OF THE CHEST 12/15/2023 08:55:00 PM COMPARISON: 12/15/2023 CLINICAL HISTORY: post chest tube FINDINGS: LINES, TUBES AND DEVICES: New pigtail left chest tube in left mid lung zone. LUNGS AND PLEURA: Near complete resolution of a trace left pleural effusion. Possible trace left apical pneumothorax. Bilateral lower lung zone fibrosis. Residual atelectasis/consolidation at left lung base. HEART AND MEDIASTINUM: No acute abnormality of the cardiac and mediastinal silhouettes. BONES AND SOFT TISSUES: No acute osseous abnormality. IMPRESSION: 1. New left pigtail chest tube in appropriate position with near-complete resolution of prior left pleural effusion. Possible trace left apical pneumothorax. 2. Residual left basilar atelectasis/consolidation. Electronically signed by: Morgane Naveau MD 12/15/2023 09:18 PM EST RP Workstation: [id number]   CT HEAD WO CONTRAST Result Date: 12/15/2023 EXAM: CT HEAD WITHOUT CONTRAST 12/15/2023 06:04:15 PM TECHNIQUE: CT of the head was performed without the administration of intravenous contrast. Automated exposure control, iterative reconstruction, and/or weight based adjustment of the mA/kV was utilized to reduce the radiation dose to as low as reasonably achievable. COMPARISON: CT Head Dec 02, 2023 CLINICAL HISTORY: Mental status change, unknown cause FINDINGS: BRAIN AND VENTRICLES: No acute hemorrhage. 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 No evidence of acute large vascular territory infarct. Similar  patchy white matter hypodensities, likely chronic microvascular ischemic changes. No hydrocephalus. No extra-axial collection. No mass effect or midline shift. ORBITS: No acute abnormality. SINUSES: No acute abnormality. SOFT TISSUES AND SKULL: No acute soft tissue abnormality. No skull fracture. IMPRESSION: 1. No evidence of acute intracranial abnormality. Please note prior watershed infarcts where occult by CT and an MRI could provide more sensitive evaluation if clinically warranted. 2. 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 Electronically signed by: Gilmore Molt MD 12/15/2023 06:26 PM EST RP Workstation: [id number]   DG Chest Port 1 View Result Date: 12/15/2023 CLINICAL DATA:  Altered mental status EXAM: PORTABLE CHEST 1 VIEW COMPARISON:  CT chest 06/19/2023 FINDINGS: There is a new large left pleural effusion. There is some patchy opacities in the right lung base. There is no pneumothorax. The heart is enlarged, unchanged. No acute fractures are seen. IMPRESSION: 1. New large left  pleural effusion. 2. Patchy opacities in the right lung base may represent atelectasis or infection. Electronically Signed   By: Greig Pique M.D.   On: 12/15/2023 17:40   _______________________________________________________________________________________________________ Latest  Blood pressure (!) 158/70, pulse (!) 139, temperature 98.3 F (36.8 C), temperature source Rectal, resp. rate (!) 34, SpO2 100%.   Vitals  labs and radiology finding personally reviewed  Review of Systems:    Pertinent positives include: ***  Constitutional:  No weight loss, night sweats, Fevers, chills, fatigue, weight loss  HEENT:  No headaches, Difficulty swallowing,Tooth/dental problems,Sore throat,  No sneezing, itching, ear ache, nasal congestion, post nasal drip,  Cardio-vascular:  No chest pain, Orthopnea, PND, anasarca, dizziness, palpitations.no Bilateral lower extremity swelling   GI:  No heartburn, indigestion, abdominal pain, nausea, vomiting, diarrhea, change in bowel habits, loss of appetite, melena, blood in stool, hematemesis Resp:  no shortness of breath at rest. No dyspnea on exertion, No excess mucus, no productive cough, No non-productive cough, No coughing up of blood.No change in color of mucus.No wheezing. Skin:  no rash or lesions. No jaundice GU:  no dysuria, change in color of urine, no urgency or frequency. No straining to urinate.  No flank pain.  Musculoskeletal:  No joint pain or no joint swelling. No decreased range of motion. No back pain.  Psych:  No change in mood or affect. No depression or anxiety. No memory loss.  Neuro: no localizing neurological complaints, no tingling, no weakness, no double vision, no gait abnormality, no slurred speech, no confusion  All systems reviewed and apart from HOPI all are negative _______________________________________________________________________________________________ Past Medical History:   Past Medical History:  Diagnosis Date   Acute bronchitis 12/17/2021   Acute cystitis 02/20/2019   Arthritis    Atrial flutter (HCC)    Breast cancer (HCC)    left  2009 and 2025   Diabetes mellitus without complication (HCC)    Dyspnea    mostly with exertion   GERD (gastroesophageal reflux disease)    History of radiation therapy    Left Lung-08/09/23-10/01/23- Dr. 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[facility])    Lupus    Pneumonia 07/31/2021      Past Surgical History:  Procedure Laterality Date   BREAST BIOPSY Left 01/15/2023   US  LT BREAST BX W LOC DEV 1ST LESION IMG BX SPEC US  GUIDE 01/15/2023 [facility]   BREAST LUMPECTOMY Left 2009   BRONCHIAL BIOPSY  06/29/2023   Procedure: BRONCHOSCOPY, WITH BIOPSY;  Surgeon: [name], MD;  Location: [facility] ENDOSCOPY;  Service: Pulmonary;;   BRONCHIAL BRUSHINGS  06/29/2023   Procedure: BRONCHOSCOPY, WITH  BRUSH BIOPSY;  Surgeon: [name], MD;  Location: [facility] ENDOSCOPY;  Service: Pulmonary;;   BRONCHIAL NEEDLE ASPIRATION BIOPSY  06/29/2023   Procedure: BRONCHOSCOPY, WITH NEEDLE ASPIRATION BIOPSY;  Surgeon: [name], MD;  Location: [facility] ENDOSCOPY;  Service: Pulmonary;;   ESOPHAGOGASTRODUODENOSCOPY N/A 12/07/2023   Procedure: EGD (ESOPHAGOGASTRODUODENOSCOPY);  Surgeon: Saintclair Jasper, MD;  Location: [facility] ENDOSCOPY;  Service: Gastroenterology;  Laterality: N/A;   IR 3D INDEPENDENT WKST  05/29/2021   IR ANGIO INTRA EXTRACRAN SEL COM CAROTID INNOMINATE BILAT MOD SED  05/29/2021   IR ANGIO VERTEBRAL SEL VERTEBRAL BILAT MOD SED  05/29/2021   LEFT HEART CATH AND CORONARY ANGIOGRAPHY N/A 11/01/2023   Procedure: LEFT HEART CATH AND CORONARY ANGIOGRAPHY;  Surgeon: Wonda Sharper, MD;  [facility] INVASIVE CV LAB;  Service: Cardiovascular;  Laterality:  N/A;   SIMPLE MASTECTOMY WITH AXILLARY SENTINEL NODE BIOPSY Left 03/31/2023   Procedure: LEFT MASTECTOMY;  Surgeon: Vernetta Berg, MD;  Location: [facility] OR;  Service: General;  Laterality: Left;  LMA PEC BLOCK   VIDEO BRONCHOSCOPY WITH ENDOBRONCHIAL NAVIGATION Left 06/29/2023   Procedure: VIDEO BRONCHOSCOPY WITH ENDOBRONCHIAL NAVIGATION;  Surgeon: [name], MD;  Location: [facility] ENDOSCOPY;  Service: Pulmonary;  Laterality: Left;   VIDEO BRONCHOSCOPY WITH ENDOBRONCHIAL ULTRASOUND Bilateral 06/29/2023   Procedure: BRONCHOSCOPY, WITH EBUS;  Surgeon: [name], MD;  Location: [facility] ENDOSCOPY;  Service: Pulmonary;  Laterality: Bilateral;    Social History:  Ambulatory *** independently cane, walker  wheelchair bound, bed bound     reports that she has never smoked. She has never been exposed to tobacco smoke. She does not have any smokeless tobacco history on file. She reports that she does not drink alcohol  and does not use drugs.     Family History: *** Family History  Problem Relation Age of Onset   Cancer Father        prostate   Prostate cancer  Father 65 - 25       metaststic   Asthma Brother    Cancer Daughter        breast   Breast cancer Daughter 37   ______________________________________________________________________________________________ Allergies: Allergies  Allergen Reactions   Ciprofloxacin Itching, Swelling and Other (See Comments)    Possibly causing tremors?   Crestor  [Rosuvastatin ] Other (See Comments)    Myalgia and back pain   Lisinopril  Cough   Ofev  [Nintedanib] Nausea Only     Prior to Admission medications   Medication Sig Start Date End Date Taking? Authorizing Provider  acetaminophen  (TYLENOL ) 325 MG tablet Take 1-2 tablets (325-650 mg total) by mouth every 4 (four) hours as needed for mild pain (pain score 1-3). 12/08/23   Love, Sharlet RAMAN, PA-C  albuterol  (VENTOLIN  HFA) 108 (90 Base) MCG/ACT inhaler Inhale 2 puffs into the lungs every 6 (six) hours as needed for wheezing or shortness of breath. 07/09/23   Ruthell Lauraine FALCON, NP  anastrozole  (ARIMIDEX ) 1 MG tablet Take 1 tablet (1 mg total) by mouth daily. 04/08/23   [name], [name], MD  apixaban  (ELIQUIS ) 2.5 MG TABS tablet Take 1 tablet (2.5 mg total) by mouth 2 (two) times daily. 11/09/23   Darci Pore, MD  aspirin  EC 81 MG tablet Take 1 tablet (81 mg total) by mouth daily. Swallow whole. 11/05/23   Ladona Heinz, MD  atorvastatin  (LIPITOR) 40 MG tablet Take 1 tablet (40 mg total) by mouth daily. 11/03/23 12/03/23  Cindy Garnette POUR, MD  Baclofen  5 MG TABS Take 1 tablet (5 mg total) by mouth 2 (two) times daily as needed for muscle spasms. 12/08/23   Love, Sharlet RAMAN, PA-C  diclofenac  Sodium (VOLTAREN ) 1 % GEL Apply 2 g topically 4 (four) times daily. To left shoulder 12/08/23   Love, Sharlet RAMAN, PA-C  diltiazem  (CARDIZEM  CD) 120 MG 24 hr capsule Take 1 capsule (120 mg total) by mouth daily. 01/07/23   Dunn, Dayna N, PA-C  escitalopram  (LEXAPRO ) 10 MG tablet Take 1 tablet (10 mg total) by mouth at bedtime. 12/08/23   Love, Sharlet RAMAN, PA-C  feeding  supplement (ENSURE PLUS HIGH PROTEIN) LIQD Take 237 mLs by mouth 2 (two) times daily between meals. 12/08/23   Love, Sharlet RAMAN, PA-C  ferrous gluconate  (FERGON) 324 MG tablet Take 1 tablet (324 mg total) by mouth daily with breakfast. 12/09/23   Jerilynn Daphne SAILOR,  NP  gabapentin  (NEURONTIN ) 100 MG capsule Take 1 capsule (100 mg total) by mouth at bedtime. 12/08/23   Love, Sharlet RAMAN, PA-C  hydroxychloroquine  (PLAQUENIL ) 200 MG tablet Take 200 mg by mouth daily.    [provider]  leptospermum manuka honey (MEDIHONEY) PSTE paste Apply 1 Application topically daily. 12/08/23   Jerilynn Daphne SAILOR, NP  levothyroxine  (SYNTHROID ) 100 MCG tablet Take 100 mcg by mouth daily. 02/05/21   [provider]  lidocaine  (LIDODERM ) 5 % Place 1 patch onto the skin daily. Remove & Discard patch within 12 hours or as directed by MD 12/08/23   Love, Sharlet RAMAN, PA-C  lip balm (CARMEX) ointment Apply topically as needed. 12/08/23   Love, Sharlet RAMAN, PA-C  magnesium  hydroxide (MILK OF MAGNESIA) 400 MG/5ML suspension Take 15 mLs by mouth daily as needed for moderate constipation. 12/08/23   Love, Sharlet RAMAN, PA-C  megestrol  (MEGACE ) 400 MG/10ML suspension Take 10 mLs (400 mg total) by mouth 2 (two) times daily. 12/08/23   Love, Sharlet RAMAN, PA-C  methylphenidate  (RITALIN ) 10 MG tablet Take 1 tablet (10 mg total) by mouth 2 (two) times daily with breakfast and lunch. 12/08/23   Love, Sharlet RAMAN, PA-C  Multiple Vitamin (MULTIVITAMIN) LIQD Take 15 mLs by mouth daily. 12/09/23   Love, Sharlet RAMAN, PA-C  nystatin  (MYCOSTATIN ) 100000 UNIT/ML suspension Use as directed 5 mLs (500,000 Units total) in the mouth or throat 4 (four) times daily. 12/08/23   Love, Sharlet RAMAN, PA-C  pantoprazole  (PROTONIX ) 40 MG tablet Take 1 tablet (40 mg total) by mouth 2 (two) times daily before a meal. 12/08/23   Love, Sharlet RAMAN, PA-C  polyethylene glycol (MIRALAX  / GLYCOLAX ) 17 g packet Take 17 g by mouth daily. 12/09/23   Love, Sharlet RAMAN, PA-C   senna-docusate (SENOKOT-S) 8.6-50 MG tablet Take 1 tablet by mouth at bedtime as needed for mild constipation. 12/08/23   Love, Sharlet RAMAN, PA-C  sodium chloride  (OCEAN) 0.65 % SOLN nasal spray Place 1 spray into both nostrils as needed for congestion (nasal dryness). 12/08/23   Jerilynn Daphne SAILOR, NP  sodium chloride  1 g tablet Take 1 tablet (1 g total) by mouth 2 (two) times daily with a meal. 12/08/23   Love, Sharlet RAMAN, PA-C  sucralfate  (CARAFATE ) 1 g tablet Take 1 g by mouth 4 (four) times daily.    [provider]  traMADol  (ULTRAM ) 50 MG tablet Take 1 tablet (50 mg total) by mouth every 6 (six) hours as needed for severe pain (pain score 7-10). 12/08/23   Maurice Sharlet RAMAN, PA-C    ___________________________________________________________________________________________________ Physical Exam:    12/15/2023    7:30 PM 12/15/2023    6:15 PM 12/15/2023    3:46 PM  Vitals with BMI  Systolic 158 154 837  Diastolic 70 63 66  Pulse 139 101 100     1. General:  in No ***Acute distress***increased work of breathing ***complaining of severe pain****agitated * Chronically ill *well *cachectic *toxic acutely ill -appearing 2. Psychological: Alert and *** Oriented 3. Head/ENT:   Moist *** Dry Mucous Membranes                          Head Non traumatic, neck supple                          Normal *** Poor Dentition 4. SKIN: normal *** decreased Skin turgor,  Skin clean Dry and  intact no rash    5. Heart: Regular rate and rhythm no*** Murmur, no Rub or gallop 6. Lungs: ***Clear to auscultation bilaterally, no wheezes or crackles   7. Abdomen: Soft, ***non-tender, Non distended *** obese ***bowel sounds present 8. Lower extremities: no clubbing, cyanosis, no ***edema 9. Neurologically Grossly intact, moving all 4 extremities equally *** strength 5 out of 5 in all 4 extremities cranial nerves II through XII intact 10. MSK: Normal range of motion    Chart has been  reviewed  ______________________________________________________________________________________________  Assessment/Plan  ***  Admitted for *** Pleural effusion ***  Altered mental status, unspecified altered mental status type ***  Malignant neoplasm of left lung, unspecified part of lung (HCC) ***    Present on Admission: **None**     No problem-specific Assessment & Plan notes found for this encounter.    Other plan as per orders.  DVT prophylaxis:  SCD *** Lovenox        Code Status:    Code Status: Limited: Do not attempt resuscitation (DNR) -DNR-LIMITED -Do Not Intubate/DNI  FULL CODE *** DNR/DNI ***comfort care as per patient ***family  I had personally discussed CODE STATUS with patient and family*  ACP *** none has been reviewed ***   Family Communication:   Family not at  Bedside  plan of care was discussed on the phone with *** Son, Daughter, Wife, Husband, Sister, Brother , father, mother  Diet    Disposition Plan:   *** likely will need placement for rehabilitation                          Back to current facility when stable                            To home once workup is complete and patient is stable  ***Following barriers for discharge:                             Chest pain *** Stroke *** Syncope ***work up is complete                            Electrolytes corrected                               Anemia corrected h/H stable                             Pain controlled with PO medications                               Afebrile, white count improving able to transition to PO antibiotics                             Will need to be able to tolerate PO                            Will likely need home health, home O2, set up  Will need consultants to evaluate patient prior to discharge                           Work of breathing improves       Consult Orders  (From admission, onward)           Start     Ordered    12/15/23 2036  Consult to hospitalist  Once       Provider:  (Not yet assigned)  Question Answer Comment  Place call to: [facility]   Reason for Consult Admit      12/15/23 2035                              ***Would benefit from PT/OT eval prior to DC  Ordered                   Swallow eval - SLP ordered                   Diabetes care coordinator                   Transition of care consulted                   Nutrition    consulted                  Wound care  consulted                   Palliative care    consulted                   Behavioral health  consulted                    Consults called: pulmonology is aware, emailed oncology    Admission status:  ED Disposition     None        Obs***  ***  inpatient     I Expect 2 midnight stay secondary to severity of patient's current illness need for inpatient interventions justified by the following: ***hemodynamic instability despite optimal treatment (tachycardia *hypotension * tachypnea *hypoxia, hypercapnia) *** Severe lab/radiological/exam abnormalities including:    Pleural effusion ***  Altered mental status, unspecified altered mental status type ***  Malignant neoplasm of left lung, unspecified part of lung (HCC) ***  and extensive comorbidities including: *substance abuse  *Chronic pain *DM2  * CHF * CAD  * COPD/asthma *Morbid Obesity * CKD *dementia *liver disease *history of stroke with residual deficits *  malignancy, * sickle cell disease  History of amputation Chronic anticoagulation  That are currently affecting medical management.   I expect  patient to be hospitalized for 2 midnights requiring inpatient medical care.  Patient is at high risk for adverse outcome (such as loss of life or disability) if not treated.  Indication for inpatient stay as follows:  Severe change from baseline regarding mental status Hemodynamic instability despite maximal medical therapy,   severe pain requiring acute inpatient management,  inability to maintain oral hydration   persistent chest pain despite medical management Need for operative/procedural  intervention New or worsening hypoxia ongoing suicidal ideations   Need for IV antibiotics, IV fluids,, IV pain medications, IV anticoagulation,  IV rate controling medications, IV antihypertensives need for biPAP Frequent labs    Level of care  progressive       Brian Zeitlin 12/15/2023, 9:39 PM ***  [facility]     after 2 AM please page floor coverage   If 7AM-7PM, please contact the day team taking care of the patient using [email or web address]

## 2023-12-15 NOTE — Assessment & Plan Note (Signed)
 Allow permissive hypertension

## 2023-12-15 NOTE — ED Provider Notes (Signed)
 [place] EMERGENCY DEPARTMENT AT [facility] Provider Note   CSN: [id number] Arrival date & time: 12/15/23  1518     Patient presents with: Altered Mental Status   [name] is a 83 y.o. female.    Altered Mental Status    Patient has history of hypothyroidism hypertension UTI lupus, asthma, interstitial lung disease, hyponatremia, sepsis, atrial flutter, CHF, lung cancer, diabetes, hypercholesterolemia, hypothyroidism.  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  Reportedly patient is more alert and talkative.  She resides at a nursing facility.  Patient's had a decline in her mental status reportedly over the last 3 days.  Patient has not been speaking at all or answering any questions.  There was some report of cough recently but no known fever.  Patient looks at me when I speak to her.  She does not answer any questions verbally.  Prior to Admission medications   Medication Sig Start Date End Date Taking? Authorizing Provider  acetaminophen  (TYLENOL ) 325 MG tablet Take 1-2 tablets (325-650 mg total) by mouth every 4 (four) hours as needed for mild pain (pain score 1-3). 12/08/23   Love, Sharlet RAMAN, PA-C  albuterol  (VENTOLIN  HFA) 108 (90 Base) MCG/ACT inhaler Inhale 2 puffs into the lungs every 6 (six) hours as needed for wheezing or shortness of breath. 07/09/23   Ruthell Lauraine FALCON, NP  anastrozole  (ARIMIDEX ) 1 MG tablet Take 1 tablet (1 mg total) by mouth daily. 04/08/23   [name], [name], MD  apixaban  (ELIQUIS ) 2.5 MG TABS tablet Take 1 tablet (2.5 mg total) by mouth 2 (two) times daily. 11/09/23   Darci Pore, MD  aspirin  EC 81 MG tablet Take 1 tablet (81 mg total) by mouth daily. Swallow whole. 11/05/23   Ladona Heinz, MD  atorvastatin  (LIPITOR) 40 MG tablet Take 1 tablet (40 mg total) by mouth daily. 11/03/23 12/03/23  Cindy Garnette POUR, MD  Baclofen  5 MG TABS Take 1 tablet (5 mg total) by mouth 2 (two) times daily as  needed for muscle spasms. 12/08/23   Love, Sharlet RAMAN, PA-C  diclofenac  Sodium (VOLTAREN ) 1 % GEL Apply 2 g topically 4 (four) times daily. To left shoulder 12/08/23   Love, Sharlet RAMAN, PA-C  diltiazem  (CARDIZEM  CD) 120 MG 24 hr capsule Take 1 capsule (120 mg total) by mouth daily. 01/07/23   Dunn, Dayna N, PA-C  escitalopram  (LEXAPRO ) 10 MG tablet Take 1 tablet (10 mg total) by mouth at bedtime. 12/08/23   Love, Sharlet RAMAN, PA-C  feeding supplement (ENSURE PLUS HIGH PROTEIN) LIQD Take 237 mLs by mouth 2 (two) times daily between meals. 12/08/23   Love, Sharlet RAMAN, PA-C  ferrous gluconate  (FERGON) 324 MG tablet Take 1 tablet (324 mg total) by mouth daily with breakfast. 12/09/23   Jerilynn Daphne SAILOR, NP  gabapentin  (NEURONTIN ) 100 MG capsule Take 1 capsule (100 mg total) by mouth at bedtime. 12/08/23   Love, Sharlet RAMAN, PA-C  hydroxychloroquine  (PLAQUENIL ) 200 MG tablet Take 200 mg by mouth daily.    [provider]  leptospermum manuka honey (MEDIHONEY) PSTE paste Apply 1 Application topically daily. 12/08/23   Jerilynn Daphne SAILOR, NP  levothyroxine  (SYNTHROID ) 100 MCG tablet Take 100 mcg by mouth daily. 02/05/21   [provider]  lidocaine  (LIDODERM ) 5 % Place 1 patch onto the skin daily. Remove & Discard patch within 12 hours or as directed by MD 12/08/23   Love, Sharlet RAMAN, PA-C  lip balm (CARMEX) ointment  Apply topically as needed. 12/08/23   Love, Sharlet RAMAN, PA-C  magnesium  hydroxide (MILK OF MAGNESIA) 400 MG/5ML suspension Take 15 mLs by mouth daily as needed for moderate constipation. 12/08/23   Love, Sharlet RAMAN, PA-C  megestrol (MEGACE) 400 MG/10ML suspension Take 10 mLs (400 mg total) by mouth 2 (two) times daily. 12/08/23   Love, Sharlet RAMAN, PA-C  methylphenidate (RITALIN) 10 MG tablet Take 1 tablet (10 mg total) by mouth 2 (two) times daily with breakfast and lunch. 12/08/23   Love, Sharlet RAMAN, PA-C  Multiple Vitamin (MULTIVITAMIN) LIQD Take 15 mLs by mouth daily. 12/09/23   Love, Sharlet RAMAN,  PA-C  nystatin  (MYCOSTATIN ) 100000 UNIT/ML suspension Use as directed 5 mLs (500,000 Units total) in the mouth or throat 4 (four) times daily. 12/08/23   Love, Sharlet RAMAN, PA-C  pantoprazole  (PROTONIX ) 40 MG tablet Take 1 tablet (40 mg total) by mouth 2 (two) times daily before a meal. 12/08/23   Love, Sharlet RAMAN, PA-C  polyethylene glycol (MIRALAX  / GLYCOLAX ) 17 g packet Take 17 g by mouth daily. 12/09/23   Love, Sharlet RAMAN, PA-C  senna-docusate (SENOKOT-S) 8.6-50 MG tablet Take 1 tablet by mouth at bedtime as needed for mild constipation. 12/08/23   Love, Sharlet RAMAN, PA-C  sodium chloride  (OCEAN) 0.65 % SOLN nasal spray Place 1 spray into both nostrils as needed for congestion (nasal dryness). 12/08/23   Jerilynn Daphne SAILOR, NP  sodium chloride  1 g tablet Take 1 tablet (1 g total) by mouth 2 (two) times daily with a meal. 12/08/23   Love, Sharlet RAMAN, PA-C  sucralfate  (CARAFATE ) 1 g tablet Take 1 g by mouth 4 (four) times daily.    [provider]  traMADol  (ULTRAM ) 50 MG tablet Take 1 tablet (50 mg total) by mouth every 6 (six) hours as needed for severe pain (pain score 7-10). 12/08/23   Love, Sharlet RAMAN, PA-C    Allergies: Ciprofloxacin, Crestor  [rosuvastatin ], Lisinopril , and Ofev  [nintedanib]    Review of Systems  Updated Vital Signs BP (!) 158/70   Pulse (!) 139   Temp 98.3 F (36.8 C) (Rectal)   Resp (!) 34   SpO2 100%   Physical Exam Vitals and nursing note reviewed.  Constitutional:      General: She is not in acute distress.    Appearance: She is well-developed. She is not diaphoretic.  HENT:     Head: Normocephalic and atraumatic.     Right Ear: External ear normal.     Left Ear: External ear normal.  Eyes:     General: No scleral icterus.       Right eye: No discharge.        Left eye: No discharge.     Conjunctiva/sclera: Conjunctivae normal.  Neck:     Trachea: No tracheal deviation.  Cardiovascular:     Rate and Rhythm: Normal rate and regular rhythm.  Pulmonary:      Effort: Pulmonary effort is normal. No respiratory distress.     Breath sounds: Normal breath sounds. No stridor. No wheezing or rales.  Abdominal:     General: Bowel sounds are normal. There is no distension.     Palpations: Abdomen is soft.     Tenderness: There is no abdominal tenderness. There is no guarding or rebound.  Musculoskeletal:        General: No tenderness or deformity.     Cervical back: Neck supple.  Skin:    General: Skin is warm and dry.  Findings: No rash.  Neurological:     General: No focal deficit present.     Mental Status: She is alert.     Cranial Nerves: No dysarthria or facial asymmetry.     Motor: Weakness present. No abnormal muscle tone or seizure activity.     Comments: Patient looks at me but not verbally answering any questions.  Some delayed responses but does seem to follow commands when asked to move her hand.  Patient does not seem to move her right arm or right leg but there is movement of her left hand left leg  Psychiatric:        Mood and Affect: Mood normal.     (all labs ordered are listed, but only abnormal results are displayed) Labs Reviewed  COMPREHENSIVE METABOLIC PANEL WITH GFR - Abnormal; Notable for the following components:      Result Value   CO2 17 (*)    Albumin 3.3 (*)    AST 47 (*)    All other components within normal limits  CBC WITH DIFFERENTIAL/PLATELET - Abnormal; Notable for the following components:   RBC 2.85 (*)    Hemoglobin 8.6 (*)    HCT 27.5 (*)    RDW 19.1 (*)    Platelets 67 (*)    All other components within normal limits  BLOOD GAS, VENOUS - Abnormal; Notable for the following components:   pCO2, Ven 23 (*)    pO2, Ven 55 (*)    Bicarbonate 13.3 (*)    Acid-base deficit 10.7 (*)    All other components within normal limits  I-STAT CHEM 8, ED - Abnormal; Notable for the following components:   TCO2 17 (*)    Hemoglobin 9.5 (*)    HCT 28.0 (*)    All other components within normal limits   CULTURE, BLOOD (ROUTINE X 2)  CULTURE, BLOOD (ROUTINE X 2)  BODY FLUID CULTURE W GRAM STAIN  TSH  AMMONIA  URINALYSIS, ROUTINE W REFLEX MICROSCOPIC  BODY FLUID CELL COUNT WITH DIFFERENTIAL  MISCELLANEOUS TEST  I-STAT CG4 LACTIC ACID, ED  CYTOLOGY - NON PAP    EKG: EKG Interpretation Date/Time:  Wednesday December 15 2023 17:06:01 EST Ventricular Rate:  114 PR Interval:  128 QRS Duration:  168 QT Interval:  477 QTC Calculation: 657 R Axis:   15  Text Interpretation: Sinus tachycardia Probable left ventricular hypertrophy Prolonged QT interval Since last tracing rate faster Confirmed by Randol Simmonds [phone]) on 12/15/2023 7:03:29 PM  Radiology: CT HEAD WO CONTRAST Result Date: 12/15/2023 EXAM: CT HEAD WITHOUT CONTRAST 12/15/2023 06:04:15 PM TECHNIQUE: CT of the head was performed without the administration of intravenous contrast. Automated exposure control, iterative reconstruction, and/or weight based adjustment of the mA/kV was utilized to reduce the radiation dose to as low as reasonably achievable. COMPARISON: CT Head Dec 02, 2023 CLINICAL HISTORY: Mental status change, unknown cause FINDINGS: BRAIN AND VENTRICLES: No acute hemorrhage. 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 No evidence of acute large vascular territory infarct. Similar patchy white matter hypodensities, likely chronic microvascular ischemic changes. No hydrocephalus. No extra-axial collection. No mass effect or midline shift. ORBITS: No acute abnormality. SINUSES: No acute abnormality. SOFT TISSUES AND SKULL: No acute soft tissue abnormality. No skull fracture. IMPRESSION: 1. No evidence of acute intracranial abnormality. Please note prior watershed infarcts where occult by CT and an MRI could provide more sensitive evaluation if clinically warranted. 2. 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 Electronically signed by: Gilmore Molt MD 12/15/2023 06:26 PM EST RP Workstation: [id number]   DG Chest Port 1 View Result Date: 12/15/2023 CLINICAL DATA:  Altered mental status EXAM: PORTABLE CHEST 1 VIEW COMPARISON:  CT chest 06/19/2023 FINDINGS: There is a new large left pleural effusion. There is some patchy opacities in the right lung base. There is no pneumothorax. The heart is enlarged, unchanged. No acute fractures are seen. IMPRESSION: 1. New large left pleural effusion. 2. Patchy opacities in the right lung base may represent atelectasis or infection. Electronically Signed   By: Greig Pique M.D.   On: 12/15/2023 17:40     CHEST TUBE INSERTION  Date/Time: 12/15/2023 8:52 PM  Performed by: Randol Simmonds, MD Authorized by: Randol Simmonds, MD   Consent:    Consent obtained:  Verbal   Consent given by:  Healthcare agent   Risks, benefits, and alternatives were discussed: yes     Risks discussed:  Bleeding, incomplete drainage, infection, damage to surrounding structures and nerve damage   Alternatives discussed:  No treatment Universal protocol:    Procedure explained and questions answered to patient or proxy's satisfaction: yes     Immediately prior to procedure, a time out was called: yes     Patient identity confirmed:  Hospital-assigned identification number Pre-procedure details:    Skin preparation:  Chlorhexidine    Preparation: Patient was prepped and draped in the usual sterile fashion   Sedation:    Sedation type:  None Anesthesia:    Anesthesia method:  Local infiltration   Local anesthetic:  Lidocaine  1% w/o epi Procedure details:    Placement location:  L lateral   Tube size (French): 18.   Ultrasound guidance: no     Tension pneumothorax: no     Tube connected to:  Suction   Drainage characteristics:  Serosanguinous Post-procedure details:    Procedure completion:  Tolerated well, no immediate complications .Critical Care  Performed by: Randol Simmonds, MD Authorized by: Randol Simmonds, MD   Critical care provider statement:    Critical care time (minutes):  30   Critical care was time spent personally by me on the following activities:  Development of treatment plan with patient or surrogate, discussions with consultants, evaluation of patient's response to treatment, examination of patient, ordering and review of laboratory studies, ordering and review of radiographic studies, ordering and performing treatments and interventions, pulse oximetry, re-evaluation of patient's condition and review of old charts    Medications Ordered in the ED  lactated ringers  bolus 500 mL (500 mLs Intravenous New Bag/Given 12/15/23 1759)  HYDROmorphone  (DILAUDID ) injection 0.5 mg (0.5 mg Intravenous Given 12/15/23 2031)    Clinical Course as of 12/15/23 2210  Wed Dec 15, 2023  1900 Head CT without acute abnormality. [JK]  1901 Chest x-ray shows new large left pleural effusion.  Patient also has patchy opacities in the right lung base [JK]  1901 CBC WITH DIFFERENTIAL(!) CBC normal.  TSH normal.  Lactic acid level normal.  Metabolic panel normal. [JK]  1901 Blood gas, venous(!) No acidosis [JK]  1901 Ammonia Ammonia normal [JK]  1921 CXR shows large  [JK]  1932 Notified that patient's breathing is acutely worse.  Patient's respiratory rate is in the 40s now.  Will start on BiPAP.  I have reviewed her results that did show she has a large pleural effusion.  Family is at bedside and discussed these results with them.  I did also review  the patient's healthcare power of attorney, living will.  She is DNR and also stipulates no intubation.  Will place a chest tube [JK]  2051 Chest tube drainage shows almost 2000 cc of fluid removed [JK]  2103 Case discussed with Dr Silvester [JK]  2135 Case discussed with Dr Gatha.  Pulmonary will see pt in am [JK]    Clinical Course User Index [JK] Randol Simmonds, MD                                 Medical Decision  Making Problems Addressed: Altered mental status, unspecified altered mental status type: acute illness or injury that poses a threat to life or bodily functions Malignant neoplasm of left lung, unspecified part of lung [facility]): chronic illness or injury Pleural effusion: acute illness or injury that poses a threat to life or bodily functions  Amount and/or Complexity of Data Reviewed Labs: ordered. Decision-making details documented in ED Course. Radiology: ordered and independent interpretation performed.  Risk Prescription drug management. Decision regarding hospitalization.   Patient presented to the ED for evaluation of altered mental status.  Patient at baseline has several comorbidities.  She has had prior strokes.  Her ED workup did not show any signs of any significant electrolyte abnormalities.  Her anemia was stable.  Patient did not have any signs of severe acidosis although there was a component suggestive of decreased oxygenation and hyperventilation.  Patient's CT scan of her head did not show any signs of hemorrhage.  Still cannot rule out the possibility of occult stroke.  Patient's x-ray however did show signs of a large pleural effusion.  Family states patient has been diagnosed recently with lung cancer.  I suspect this could be a malignant effusion.  Patient started having increasing respiratory distress.  She became more tachypneic.  I placed a chest tube pigtail catheter at the bedside.  Patient's respiratory status has improved.  I did review her DNR and healthcare power of attorney.  Patient would not want intubation or CPR but it does appear that she would agree to the measures that we are providing at this time.  I will consult was pulmonary critical care regarding her chest tube and we will consult with the hospitalist service for admission     Final diagnoses:  Pleural effusion  Altered mental status, unspecified altered mental status type  Malignant neoplasm of  left lung, unspecified part of lung [facility])    ED Discharge Orders     None          Randol Simmonds, MD 12/15/23 2211

## 2023-12-16 ENCOUNTER — Inpatient Hospital Stay (HOSPITAL_COMMUNITY)

## 2023-12-16 ENCOUNTER — Inpatient Hospital Stay: Admitting: Hematology and Oncology

## 2023-12-16 ENCOUNTER — Inpatient Hospital Stay (HOSPITAL_COMMUNITY)
Admit: 2023-12-16 | Discharge: 2023-12-16 | Disposition: A | Discharge status: Home / Self Care | Attending: Internal Medicine | Admitting: Internal Medicine

## 2023-12-16 ENCOUNTER — Inpatient Hospital Stay

## 2023-12-16 DIAGNOSIS — Z8673 Personal history of transient ischemic attack (TIA), and cerebral infarction without residual deficits: Secondary | ICD-10-CM

## 2023-12-16 DIAGNOSIS — Z515 Encounter for palliative care: Secondary | ICD-10-CM

## 2023-12-16 DIAGNOSIS — G9341 Metabolic encephalopathy: Secondary | ICD-10-CM | POA: Diagnosis present

## 2023-12-16 DIAGNOSIS — R4182 Altered mental status, unspecified: Secondary | ICD-10-CM | POA: Diagnosis not present

## 2023-12-16 DIAGNOSIS — Z7189 Other specified counseling: Secondary | ICD-10-CM

## 2023-12-16 DIAGNOSIS — J9601 Acute respiratory failure with hypoxia: Secondary | ICD-10-CM | POA: Diagnosis not present

## 2023-12-16 DIAGNOSIS — R9431 Abnormal electrocardiogram [ECG] [EKG]: Secondary | ICD-10-CM | POA: Diagnosis present

## 2023-12-16 DIAGNOSIS — R4589 Other symptoms and signs involving emotional state: Secondary | ICD-10-CM

## 2023-12-16 DIAGNOSIS — Z66 Do not resuscitate: Secondary | ICD-10-CM

## 2023-12-16 DIAGNOSIS — J90 Pleural effusion, not elsewhere classified: Secondary | ICD-10-CM | POA: Diagnosis not present

## 2023-12-16 LAB — CBC WITH DIFFERENTIAL/PLATELET
Abs Immature Granulocytes: 0.05 K/uL (ref 0.00–0.07)
Basophils Absolute: 0 K/uL (ref 0.0–0.1)
Basophils Relative: 0 %
Eosinophils Absolute: 0 K/uL (ref 0.0–0.5)
Eosinophils Relative: 0 %
HCT: 23.7 % — ABNORMAL LOW (ref 36.0–46.0)
Hemoglobin: 7.3 g/dL — ABNORMAL LOW (ref 12.0–15.0)
Immature Granulocytes: 1 %
Lymphocytes Relative: 18 %
Lymphs Abs: 1.7 K/uL (ref 0.7–4.0)
MCH: 30.4 pg (ref 26.0–34.0)
MCHC: 30.8 g/dL (ref 30.0–36.0)
MCV: 98.8 fL (ref 80.0–100.0)
Monocytes Absolute: 0.6 K/uL (ref 0.1–1.0)
Monocytes Relative: 7 %
Neutro Abs: 7 K/uL (ref 1.7–7.7)
Neutrophils Relative %: 74 %
Platelets: 32 K/uL — ABNORMAL LOW (ref 150–400)
RBC: 2.4 MIL/uL — ABNORMAL LOW (ref 3.87–5.11)
RDW: 19 % — ABNORMAL HIGH (ref 11.5–15.5)
Smear Review: NORMAL
WBC: 9.4 K/uL (ref 4.0–10.5)
nRBC: 0 % (ref 0.0–0.2)

## 2023-12-16 LAB — CBC
HCT: 26.2 % — ABNORMAL LOW (ref 36.0–46.0)
Hemoglobin: 7.5 g/dL — ABNORMAL LOW (ref 12.0–15.0)
MCH: 29.9 pg (ref 26.0–34.0)
MCHC: 28.6 g/dL — ABNORMAL LOW (ref 30.0–36.0)
MCV: 104.4 fL — ABNORMAL HIGH (ref 80.0–100.0)
Platelets: 27 K/uL — CL (ref 150–400)
RBC: 2.51 MIL/uL — ABNORMAL LOW (ref 3.87–5.11)
RDW: 19.1 % — ABNORMAL HIGH (ref 11.5–15.5)
WBC: 9.3 K/uL (ref 4.0–10.5)
nRBC: 0 % (ref 0.0–0.2)

## 2023-12-16 LAB — BASIC METABOLIC PANEL WITH GFR
Anion gap: 13 (ref 5–15)
BUN: 19 mg/dL (ref 8–23)
CO2: 15 mmol/L — ABNORMAL LOW (ref 22–32)
Calcium: 8.8 mg/dL — ABNORMAL LOW (ref 8.9–10.3)
Chloride: 112 mmol/L — ABNORMAL HIGH (ref 98–111)
Creatinine, Ser: 0.72 mg/dL (ref 0.44–1.00)
GFR, Estimated: >60 mL/min (ref >60)
Glucose, Bld: 124 mg/dL — ABNORMAL HIGH (ref 70–99)
Potassium: 3.9 mmol/L (ref 3.5–5.1)
Sodium: 140 mmol/L (ref 135–145)

## 2023-12-16 LAB — COMPREHENSIVE METABOLIC PANEL WITH GFR
ALT: 28 U/L (ref 0–44)
AST: 53 U/L — ABNORMAL HIGH (ref 15–41)
Albumin: 2.8 g/dL — ABNORMAL LOW (ref 3.5–5.0)
Alkaline Phosphatase: 71 U/L (ref 38–126)
Anion gap: 14 (ref 5–15)
BUN: 18 mg/dL (ref 8–23)
CO2: 14 mmol/L — ABNORMAL LOW (ref 22–32)
Calcium: 8.6 mg/dL — ABNORMAL LOW (ref 8.9–10.3)
Chloride: 112 mmol/L — ABNORMAL HIGH (ref 98–111)
Creatinine, Ser: 0.69 mg/dL (ref 0.44–1.00)
GFR, Estimated: >60 mL/min (ref >60)
Glucose, Bld: 121 mg/dL — ABNORMAL HIGH (ref 70–99)
Potassium: 4 mmol/L (ref 3.5–5.1)
Sodium: 140 mmol/L (ref 135–145)
Total Bilirubin: 0.6 mg/dL (ref 0.0–1.2)
Total Protein: 5.9 g/dL — ABNORMAL LOW (ref 6.5–8.1)

## 2023-12-16 LAB — BLOOD GAS, VENOUS
Acid-base deficit: 6.5 mmol/L — ABNORMAL HIGH (ref 0.0–2.0)
Bicarbonate: 19.7 mmol/L — ABNORMAL LOW (ref 20.0–28.0)
O2 Saturation: 57.6 %
Patient temperature: 37
pCO2, Ven: 41 mmHg — ABNORMAL LOW (ref 44–60)
pH, Ven: 7.29 (ref 7.25–7.43)
pO2, Ven: 38 mmHg (ref 32–45)

## 2023-12-16 LAB — IRON AND TIBC
Iron: 30 ug/dL (ref 28–170)
Saturation Ratios: 14 % (ref 10.4–31.8)
TIBC: 216 ug/dL — ABNORMAL LOW (ref 250–450)
UIBC: 186 ug/dL

## 2023-12-16 LAB — FERRITIN: Ferritin: 787 ng/mL — ABNORMAL HIGH (ref 11–307)

## 2023-12-16 LAB — TROPONIN T, HIGH SENSITIVITY
Troponin T High Sensitivity: 225 ng/L (ref 0–19)
Troponin T High Sensitivity: 218 ng/L (ref 0–19)

## 2023-12-16 LAB — T4, FREE: Free T4: 1.09 ng/dL (ref 0.61–1.12)

## 2023-12-16 LAB — PHOSPHORUS
Phosphorus: 2.7 mg/dL (ref 2.5–4.6)
Phosphorus: 2.6 mg/dL (ref 2.5–4.6)

## 2023-12-16 LAB — RESP PANEL BY RT-PCR (RSV, FLU A&B, COVID)  RVPGX2
Influenza A by PCR: NEGATIVE
Influenza B by PCR: NEGATIVE
Resp Syncytial Virus by PCR: NEGATIVE
SARS Coronavirus 2 by RT PCR: NEGATIVE

## 2023-12-16 LAB — BODY FLUID CELL COUNT WITH DIFFERENTIAL
Eos, Fluid: 0 %
Lymphs, Fluid: 18 %
Monocyte-Macrophage-Serous Fluid: 0 % — ABNORMAL LOW (ref 50–90)
Neutrophil Count, Fluid: 82 % — ABNORMAL HIGH (ref 0–25)
Total Nucleated Cell Count, Fluid: 1800 uL — ABNORMAL HIGH (ref 0–1000)

## 2023-12-16 LAB — MAGNESIUM
Magnesium: 2 mg/dL (ref 1.7–2.4)
Magnesium: 2 mg/dL (ref 1.7–2.4)

## 2023-12-16 LAB — GLUCOSE, CAPILLARY
Glucose-Capillary: 107 mg/dL — ABNORMAL HIGH (ref 70–99)
Glucose-Capillary: 108 mg/dL — ABNORMAL HIGH (ref 70–99)

## 2023-12-16 LAB — PROCALCITONIN
Procalcitonin: 0.12 ng/mL
Procalcitonin: 0.57 ng/mL

## 2023-12-16 LAB — RETICULOCYTES
Immature Retic Fract: 35.5 % — ABNORMAL HIGH (ref 2.3–15.9)
RBC.: 2.17 MIL/uL — ABNORMAL LOW (ref 3.87–5.11)
Retic Count, Absolute: 104.8 K/uL (ref 19.0–186.0)
Retic Ct Pct: 4.8 % — ABNORMAL HIGH (ref 0.4–3.1)

## 2023-12-16 LAB — APTT: aPTT: 43 s — ABNORMAL HIGH (ref 24–36)

## 2023-12-16 LAB — MRSA NEXT GEN BY PCR, NASAL: MRSA by PCR Next Gen: NOT DETECTED

## 2023-12-16 LAB — CREATININE, URINE, RANDOM: Creatinine, Urine: 148 mg/dL

## 2023-12-16 LAB — CBG MONITORING, ED
Glucose-Capillary: 112 mg/dL — ABNORMAL HIGH (ref 70–99)
Glucose-Capillary: 118 mg/dL — ABNORMAL HIGH (ref 70–99)

## 2023-12-16 LAB — CK: Total CK: 152 U/L (ref 38–234)

## 2023-12-16 LAB — HEPARIN LEVEL (UNFRACTIONATED): Heparin Unfractionated: <0.1 [IU]/mL — ABNORMAL LOW (ref 0.30–0.70)

## 2023-12-16 LAB — LACTATE DEHYDROGENASE: LDH: 554 U/L — ABNORMAL HIGH (ref 105–235)

## 2023-12-16 MED ORDER — SODIUM CHLORIDE 0.9 % IV SOLN
2.0000 g | INTRAVENOUS | Status: DC
  Administered 2023-12-16 – 2023-12-18 (×3): 2 g via INTRAVENOUS
  Filled 2023-12-16 (×3): qty 20

## 2023-12-16 MED ORDER — GADOBUTROL 1 MMOL/ML IV SOLN
5.0000 mL | Freq: Once | INTRAVENOUS | Status: AC | PRN
  Administered 2023-12-16: 5 mL via INTRAVENOUS

## 2023-12-16 MED ORDER — ACETAMINOPHEN 500 MG PO TABS: 1000.0000 mg | ORAL_TABLET | Freq: Once | ORAL | Status: DC

## 2023-12-16 MED ORDER — FENTANYL CITRATE (PF) 50 MCG/ML IJ SOSY: 12.5000 ug | PREFILLED_SYRINGE | INTRAMUSCULAR | Status: DC | PRN

## 2023-12-16 MED ORDER — MORPHINE SULFATE (PF) 2 MG/ML IV SOLN
1.0000 mg | INTRAVENOUS | Status: DC | PRN
  Administered 2023-12-16 – 2023-12-19 (×12): 1 mg via INTRAVENOUS
  Filled 2023-12-16 (×12): qty 1

## 2023-12-16 MED ORDER — ACETAMINOPHEN 325 MG PO TABS: 650.0000 mg | ORAL_TABLET | Freq: Four times a day (QID) | ORAL | Status: DC | PRN

## 2023-12-16 MED ORDER — HYDROCODONE-ACETAMINOPHEN 5-325 MG PO TABS: 1.0000 | ORAL_TABLET | ORAL | Status: DC | PRN

## 2023-12-16 MED ORDER — ACETAMINOPHEN 650 MG RE SUPP: 650.0000 mg | Freq: Four times a day (QID) | RECTAL | Status: DC | PRN

## 2023-12-16 MED ORDER — NYSTATIN 100000 UNIT/ML MT SUSP
5.0000 mL | Freq: Four times a day (QID) | OROMUCOSAL | Status: DC
  Administered 2023-12-17 – 2023-12-18 (×8): 500000 [IU] via ORAL
  Filled 2023-12-16 (×7): qty 5

## 2023-12-16 MED ORDER — ORAL CARE MOUTH RINSE: 15.0000 mL | OROMUCOSAL | Status: DC | PRN

## 2023-12-16 MED ORDER — ALBUTEROL SULFATE (2.5 MG/3ML) 0.083% IN NEBU
2.5000 mg | INHALATION_SOLUTION | RESPIRATORY_TRACT | Status: DC | PRN
Start: 2023-12-16 — End: 2023-12-16

## 2023-12-16 MED ORDER — DILTIAZEM HCL-DEXTROSE 125-5 MG/125ML-% IV SOLN (PREMIX)
5.0000 mg/h | INTRAVENOUS | Status: DC
  Administered 2023-12-16 – 2023-12-18 (×3): 5 mg/h via INTRAVENOUS
  Filled 2023-12-16 (×3): qty 125

## 2023-12-16 MED ORDER — CARMEX CLASSIC LIP BALM EX OINT
TOPICAL_OINTMENT | CUTANEOUS | Status: DC | PRN
  Filled 2023-12-16: qty 10

## 2023-12-16 MED ORDER — SODIUM CHLORIDE 0.9 % IV SOLN
100.0000 mg | Freq: Two times a day (BID) | INTRAVENOUS | Status: DC
  Administered 2023-12-16 – 2023-12-19 (×7): 100 mg via INTRAVENOUS
  Filled 2023-12-16 (×8): qty 100

## 2023-12-16 MED ORDER — ALBUTEROL SULFATE (2.5 MG/3ML) 0.083% IN NEBU: 2.5000 mg | INHALATION_SOLUTION | RESPIRATORY_TRACT | Status: DC | PRN

## 2023-12-16 MED ORDER — SODIUM CHLORIDE 0.9 % IV BOLUS
500.0000 mL | Freq: Once | INTRAVENOUS | Status: AC
  Administered 2023-12-16: 500 mL via INTRAVENOUS

## 2023-12-16 MED ORDER — SODIUM CHLORIDE 0.9 % IV SOLN: INTRAVENOUS | Status: DC

## 2023-12-16 MED ORDER — PANTOPRAZOLE SODIUM 40 MG IV SOLR
40.0000 mg | Freq: Every day | INTRAVENOUS | Status: DC
  Administered 2023-12-16 – 2023-12-18 (×3): 40 mg via INTRAVENOUS
  Filled 2023-12-16 (×3): qty 10

## 2023-12-16 MED ORDER — SODIUM CHLORIDE 0.9% IV SOLUTION: Freq: Once | INTRAVENOUS | Status: AC

## 2023-12-16 MED ORDER — SODIUM CHLORIDE 0.9 % IV SOLN: INTRAVENOUS | Status: AC

## 2023-12-16 NOTE — Consult Note (Signed)
 NAME:  [name], MRN:  [id number], DOB:  [birth year], LOS: 1 ADMISSION DATE:  12/15/2023, CONSULTATION DATE:  12/15/2023 REFERRING MD:  [facility], CHIEF COMPLAINT:  pleural effusion   History of Present Illness:   46 yoF with PMH as below significant for stage IIIa lung adenocarcinoma undergoing radiation therapy, emphysema, ILD with UIP pattern, SLE on plaquenil  and prednisone , HTN, afib on Eliquis , hx breast cancer s/p mastectomy, DMT2, hypothyroidism, CVA with right sided hemiplegia, HTN, HFpEF, CAD, NSTEMI, hyponatremia/ SIADH, and chronic anemia.  Recent admit 10/14- 11/13 with acute CVA discharged to CIR then to SNF on 11/13.  Recently having esophagitis after radiation with some difficulty in eating and drinking.  Presented from SNF with 3 days of AMS, some cough but no known fever.  In ER, afebrile, WBC 9.6, H/H 8.6/ 27, plts 67 (previously 58), lactic 1.8, PCT 0.12, UA neg.  CTH neg.  CXR showed large left pleural effusion and right base opacities vs atelectasis.  Developed respiratory distress in ER, placed on BiPAP and underwent CT placement in ER with 2L drained and sent for pleural studies, culture and cytology.  Started empirically on ceftriaxone  and doxycycline . Admitted to [facility].  Pt is DNR/ DNI.  Pulmonary consulted for further input with left pleural effusion and CT management.   Pt remains encephalopathic.  Son at bedside but verbalizes he lives out of town and his he is unsure of specific details, states pt was living with his sister prior to hospitalizations and she would know more.      Pertinent  Medical History   Past Medical History:  Diagnosis Date   Acute bronchitis 12/17/2021   Acute cystitis 02/20/2019   Arthritis    Atrial flutter (HCC)    Breast cancer (HCC)    left  2009 and 2025   Diabetes mellitus without complication (HCC)    Dyspnea    mostly with exertion   GERD (gastroesophageal reflux disease)    History of radiation therapy    Left  Lung-08/09/23-10/01/23- Dr. 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 rate (!) 21, SpO2 99%.    FiO2 (%):  [35 %-40 %] 35 %   Intake/Output Summary (Last 24 hours) at 12/16/2023 0902 Last data filed at 12/16/2023 0210 Gross per 24 hour  Intake --  Output 1900 ml  Net -1900 ml   There were no vitals filed for this visit.  Examination: General:  chronically ill and frail appearing elderly female lying in bed in NAD HEENT: MM pink/dry, edentulous  Neuro: awake to verbal, looks at me, attempts to verbalize name, intermittently follows simple commands, wiggles toes L>R, no spont movement to gravity CV: rr, ?pericardial rub PULM:  non labored, scattered wheeze, few rales/ rhonchi R base, left lateral pigtail CT with bulky dressing- flushed/ patent, no airleak/ tidaling, serosanguinous drainage~340 ml, currently on VM at 35%/ 6L at 100% Extremities: warm/dry, no pitting LE edema  Skin: no rashes  No I/Os recorded> ER procedure noted almost 2L output after CT placement  Resolved problem list   Assessment and Plan  Left pleural effusion LLL atelectasis vs PNA vs underlying scaring LLL and bronchus adenocarcinoma stage IIIa s/p radiation Hx breast Cancer  Acute hypoxic respiratory failure ILD, hx Lupus  Dysphagia  DNR/ DNI - small left pleural effusion noted back on CXR 11/19/23 P:  - neutrophil predominant.  Add on protein, glucose and LDH.  GS neg. Await culture and cytology - cont CT to 20 sxn, flush q8hrs - follow CT output closely, ideally consider removal with 24hr output < 100 ml -  urine strep and legionella ordered, check MRSA PCR - cont ceftriaxone / doxycycline  - remains NPO with encephalopathy, prior dysphagia 3 diet - recheck CXR and CBC now - cont to hold eliquis  today  - albuterol  PRN  - wean supplemental O2 for sat goal > 90% - cont to hold plaquenil / prednisone  - pulmonary will continue to follow    Remainder per primary team  Encephalopathy - pending MRI  HTN HFpEF- does not appear to be exacerbation PAF/ flutter Anemia   Labs   CBC: Recent Labs  Lab 12/15/23 1702 12/15/23 1713  WBC 9.6  --   NEUTROABS 7.0  --   HGB 8.6* 9.5*  HCT 27.5* 28.0*  MCV 96.5  --   PLT 67*  --     Basic Metabolic Panel: Recent Labs  Lab 12/15/23 1702 12/15/23 1713  NA 137 139  K 3.5 3.5  CL 107 109  CO2 17*  --   GLUCOSE 95 94  BUN 15 14  CREATININE 0.72 0.70  CALCIUM  9.1  --   MG 2.0  --   PHOS 2.7  --    GFR: Estimated Creatinine Clearance: 47.9 mL/min (by C-G formula based on SCr of 0.7 mg/dL). Recent Labs  Lab 12/15/23 1702 12/15/23 1713  PROCALCITON 0.12  --   WBC 9.6  --   LATICACIDVEN  --  1.8    Liver Function Tests: Recent Labs  Lab 12/15/23 1702  AST 47*  ALT 32  ALKPHOS 84  BILITOT 1.0  PROT 6.9  ALBUMIN 3.3*   No results for input(s): LIPASE, AMYLASE in the last 168 hours. Recent Labs  Lab 12/15/23 1702  AMMONIA 15    ABG    Component Value Date/Time   HCO3 19.7 (L) 12/16/2023 0415   TCO2 17 (L) 12/15/2023 1713   ACIDBASEDEF 6.5 (H) 12/16/2023 0415   O2SAT 57.6 12/16/2023 0415     Coagulation Profile: No results for input(s): INR, PROTIME in the last 168 hours.  Cardiac Enzymes: Recent Labs  Lab 12/15/23 1702  CKTOTAL 152    HbA1C: Hgb A1c MFr Bld  Date/Time Value Ref Range Status  11/06/2023 02:33 PM 6.0 (H) 4.8 - 5.6 % Final    Comment:    (NOTE) Diagnosis of Diabetes The following HbA1c ranges recommended by the American Diabetes Association (ADA) may be used as an aid in the  diagnosis of diabetes mellitus.  Hemoglobin             Suggested A1C NGSP%              Diagnosis  <5.7                   Non Diabetic  5.7-6.4                Pre-Diabetic  >6.4                   Diabetic  <7.0  Glycemic control for                       adults with diabetes.    03/26/2023 01:45 PM 6.8 (H) 4.8 - 5.6 % Final    Comment:    (NOTE) Pre diabetes:          5.7%-6.4%  Diabetes:              >6.4%  Glycemic control for   <7.0% adults with diabetes     CBG: Recent Labs  Lab 12/09/23 1224 12/16/23 0256 12/16/23 0411  GLUCAP 121* 112* 118*    Review of Systems:   Unable   Past Medical History:  She,  has a past medical history of Acute bronchitis (12/17/2021), Acute cystitis (02/20/2019), Arthritis, Atrial flutter (HCC), Breast cancer (HCC), Diabetes mellitus without complication (HCC), Dyspnea, GERD (gastroesophageal reflux disease), History of radiation therapy, Hypertension, Hypothyroidism, Libman Sacks endocarditis (HCC), Lupus, and Pneumonia (07/31/2021).   Surgical History:   Past Surgical History:  Procedure Laterality Date   BREAST BIOPSY Left 01/15/2023   US  LT BREAST BX W LOC DEV 1ST LESION IMG BX SPEC US  GUIDE 01/15/2023 [facility]MOGR[facility]   BREAST LUMPECTOMY Left 2009   BRONCHIAL BIOPSY  06/29/2023   Procedure: BRONCHOSCOPY, WITH BIOPSY;  Surgeon: [name], MD;  Location: [facility] ENDOSCOPY;  Service: Pulmonary;;   BRONCHIAL BRUSHINGS  06/29/2023   Procedure: BRONCHOSCOPY, WITH BRUSH BIOPSY;  Surgeon: [name], MD;  Location: [facility] ENDOSCOPY;  Service: Pulmonary;;   BRONCHIAL NEEDLE ASPIRATION BIOPSY  06/29/2023   Procedure: BRONCHOSCOPY, WITH NEEDLE ASPIRATION BIOPSY;  Surgeon: [name], MD;  Location: [facility] ENDOSCOPY;  Service: Pulmonary;;   ESOPHAGOGASTRODUODENOSCOPY N/A 12/07/2023   Procedure: EGD (ESOPHAGOGASTRODUODENOSCOPY);  Surgeon: Saintclair Jasper, MD;  Location: [facility] ENDOSCOPY;  Service: Gastroenterology;  Laterality:  N/A;   IR 3D INDEPENDENT WKST  05/29/2021   IR ANGIO INTRA EXTRACRAN SEL COM CAROTID INNOMINATE BILAT MOD SED  05/29/2021   IR ANGIO VERTEBRAL SEL VERTEBRAL BILAT MOD SED  05/29/2021   LEFT HEART CATH AND CORONARY ANGIOGRAPHY N/A 11/01/2023   Procedure: LEFT HEART CATH AND CORONARY ANGIOGRAPHY;  Surgeon: Wonda Sharper, MD;  Location: [facility] INVASIVE CV LAB;  Service: Cardiovascular;  Laterality: N/A;   SIMPLE MASTECTOMY WITH AXILLARY SENTINEL NODE BIOPSY Left 03/31/2023   Procedure: LEFT MASTECTOMY;  Surgeon: Vernetta Berg, MD;  Location[facility] OR;  Service: General;  Laterality: Left;  LMA PEC BLOCK   VIDEO BRONCHOSCOPY WITH ENDOBRONCHIAL NAVIGATION Left 06/29/2023   Procedure: VIDEO BRONCHOSCOPY WITH ENDOBRONCHIAL NAVIGATION;  Surgeon: [name], MD;  Location: [facility] ENDOSCOPY;  Service: Pulmonary;  Laterality: Left;   VIDEO BRONCHOSCOPY WITH ENDOBRONCHIAL ULTRASOUND Bilateral 06/29/2023   Procedure: BRONCHOSCOPY, WITH EBUS;  Surgeon: [name], MD;  Location: [facility] ENDOSCOPY;  Service: Pulmonary;  Laterality: Bilateral;     Social History:   reports that she has never smoked. She has never been exposed to tobacco smoke. She does not have any smokeless tobacco history on file. She reports that she does not drink alcohol  and does not use drugs.   Family History:  Her family history includes Asthma in her brother; Breast cancer (age of onset: 57) in her daughter; Cancer in her daughter and father; Prostate cancer (age of onset: 21 - 71) in her father.   Allergies Allergies  Allergen Reactions   Ciprofloxacin Itching, Swelling and Other (See Comments)    Possibly causing tremors?   Crestor  [Rosuvastatin ] Other (See Comments)  Myalgia and back pain   Lisinopril  Cough   Ofev  [Nintedanib] Nausea Only     Home Medications  Prior to Admission medications   Medication Sig Start Date End Date Taking? Authorizing Provider  acetaminophen  (TYLENOL ) 325 MG tablet Take 1-2 tablets (325-650 mg  total) by mouth every 4 (four) hours as needed for mild pain (pain score 1-3). Patient taking differently: Take 650 mg by mouth every 4 (four) hours as needed for mild pain (pain score 1-3). 12/08/23  Yes Love, Sharlet RAMAN, PA-C  albuterol  (VENTOLIN  HFA) 108 (90 Base) MCG/ACT inhaler Inhale 2 puffs into the lungs every 6 (six) hours as needed for wheezing or shortness of breath. 07/09/23  Yes Ruthell Lauraine FALCON, NP  anastrozole  (ARIMIDEX ) 1 MG tablet Take 1 tablet (1 mg total) by mouth daily. 04/08/23  Yes [name], [name], MD  aspirin  EC 81 MG tablet Take 1 tablet (81 mg total) by mouth daily. Swallow whole. 11/05/23  Yes Ladona Heinz, MD  atorvastatin  (LIPITOR) 40 MG tablet Take 40 mg by mouth daily at 6 PM.   Yes [provider]  Baclofen 5 MG TABS Take 1 tablet (5 mg total) by mouth 2 (two) times daily as needed for muscle spasms. 12/08/23  Yes Love, Sharlet RAMAN, PA-C  diclofenac  Sodium (VOLTAREN ) 1 % GEL Apply 2 g topically 4 (four) times daily. To left shoulder Patient taking differently: Apply 2 g topically See admin instructions. Apply 2 grams to the left shoulder every 6 hours 12/08/23  Yes Love, Sharlet RAMAN, PA-C  diltiazem  (CARDIZEM  CD) 120 MG 24 hr capsule Take 1 capsule (120 mg total) by mouth daily. 01/07/23  Yes Dunn, Dayna N, PA-C  escitalopram (LEXAPRO) 10 MG tablet Take 1 tablet (10 mg total) by mouth at bedtime. 12/08/23  Yes Love, Sharlet RAMAN, PA-C  ferrous gluconate (FERGON) 324 MG tablet Take 1 tablet (324 mg total) by mouth daily with breakfast. 12/09/23  Yes Jerilynn Daphne SAILOR, NP  gabapentin  (NEURONTIN ) 100 MG capsule Take 1 capsule (100 mg total) by mouth at bedtime. 12/08/23  Yes Love, Sharlet RAMAN, PA-C  hydroxychloroquine  (PLAQUENIL ) 200 MG tablet Take 200 mg by mouth daily.   Yes [provider]  levothyroxine  (SYNTHROID ) 100 MCG tablet Take 100 mcg by mouth daily before breakfast. 02/05/21  Yes [provider]  lidocaine  (LIDODERM ) 5 % Place 1 patch onto the skin daily.  Remove & Discard patch within 12 hours or as directed by MD Patient taking differently: Place 1-3 patches onto the skin See admin instructions. Apply 1-3 patches topically to both shoulders at 8 AM- Remove & Discard patch within 12 hours or as directed by MD 12/08/23  Yes Love, Sharlet RAMAN, PA-C  magnesium  hydroxide (MILK OF MAGNESIA) 400 MG/5ML suspension Take 15 mLs by mouth daily as needed for moderate constipation. Patient taking differently: Take 15 mLs by mouth daily as needed (for constipation). 12/08/23  Yes Love, Sharlet RAMAN, PA-C  megestrol (MEGACE) 400 MG/10ML suspension Take 10 mLs (400 mg total) by mouth 2 (two) times daily. Patient taking differently: Take 400 mg by mouth in the morning and at bedtime. 12/08/23  Yes Love, Sharlet RAMAN, PA-C  methylphenidate (RITALIN) 10 MG tablet Take 1 tablet (10 mg total) by mouth 2 (two) times daily with breakfast and lunch. 12/08/23  Yes Love, Sharlet RAMAN, PA-C  Multiple Vitamin (MULTIVITAMIN) tablet Take 1 tablet by mouth daily with breakfast.   Yes [provider]  nystatin  (MYCOSTATIN ) 100000 UNIT/ML suspension Use as directed 5  mLs (500,000 Units total) in the mouth or throat 4 (four) times daily. 12/08/23  Yes Love, Sharlet RAMAN, PA-C  OVER THE COUNTER MEDICATION Take 120 mLs by mouth See admin instructions. House supplement 2.0 - 120 ml's by mouth three times a day   Yes [provider]  pantoprazole  (PROTONIX ) 40 MG tablet Take 1 tablet (40 mg total) by mouth 2 (two) times daily before a meal. 12/08/23  Yes Love, Sharlet RAMAN, PA-C  polyethylene glycol (MIRALAX  / GLYCOLAX ) 17 g packet Take 17 g by mouth daily. 12/09/23  Yes Love, Sharlet RAMAN, PA-C  senna-docusate (SENOKOT-S) 8.6-50 MG tablet Take 1 tablet by mouth at bedtime as needed for mild constipation. 12/08/23  Yes Love, Sharlet RAMAN, PA-C  sodium chloride  (OCEAN) 0.65 % SOLN nasal spray Place 1 spray into both nostrils as needed for congestion (nasal dryness). 12/08/23  Yes Jerilynn Daphne SAILOR, NP   sodium chloride  0.9 % infusion Inject 75 mL/hr into the vein continuous. 12/14/23 12/16/23 Yes [provider]  sodium chloride  1 g tablet Take 1 tablet (1 g total) by mouth 2 (two) times daily with a meal. 12/08/23  Yes Love, Sharlet RAMAN, PA-C  sucralfate  (CARAFATE ) 1 g tablet Take 1 g by mouth 4 (four) times daily.   Yes [provider]  traMADol  (ULTRAM ) 50 MG tablet Take 1 tablet (50 mg total) by mouth every 6 (six) hours as needed for severe pain (pain score 7-10). 12/08/23  Yes Love, Sharlet RAMAN, PA-C  apixaban  (ELIQUIS ) 2.5 MG TABS tablet Take 1 tablet (2.5 mg total) by mouth 2 (two) times daily. 11/09/23   Darci Pore, MD  atorvastatin  (LIPITOR) 40 MG tablet Take 1 tablet (40 mg total) by mouth daily. Patient not taking: Reported on 12/15/2023 11/03/23 12/15/23  Cindy Garnette POUR, MD  feeding supplement (ENSURE PLUS HIGH PROTEIN) LIQD Take 237 mLs by mouth 2 (two) times daily between meals. Patient not taking: Reported on 12/15/2023 12/08/23   Love, Sharlet RAMAN, PA-C  leptospermum manuka honey (MEDIHONEY) PSTE paste Apply 1 Application topically daily. Patient not taking: Reported on 12/15/2023 12/08/23   Jerilynn Daphne SAILOR, NP  lip balm (CARMEX) ointment Apply topically as needed. Patient not taking: Reported on 12/15/2023 12/08/23   Maurice Sharlet RAMAN, PA-C     Critical care time: n/a     Lyle Pesa, NP [facility] 12/16/2023, 10:18 AM  See Amion for pager If no response to pager , please call [phone] until 7pm After 7:00 pm call Elink  [phone]

## 2023-12-16 NOTE — ED Notes (Signed)
 Chest tray emptied at 0210 1900 output

## 2023-12-16 NOTE — Assessment & Plan Note (Signed)
-  will monitor on tele avoid QT prolonging medications, rehydrate correct electrolytes ? ?

## 2023-12-16 NOTE — Consult Note (Signed)
 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  Upon admission, patient initially required support with BiPAP and imaging obtained noted large left pleural effusion which required chest tube placement.  PCCM and oncology consulted for recommendations.  Since admission patient has continued to receive workup for acute respiratory failure secondary to pleural effusion and altered mental status.  Palliative medicine team consulted to assist with complex medical decision making.  Extensive review of EMR including recent documentation from ED physician, hospitalist, PCCM, and oncology.  Also noted neurology performed procedure today and EEG was suggestive of moderate diffuse encephalopathy nonspecific etiology but likely related to sedation, toxic metabolic etiology.  Did not show any signs of seizures or epileptiform discharges.  Patient has had drainage from left chest tube and noted improvement of pleural effusion on imaging, does have underlying fibrosis.  Personally reviewed this chest x-ray imaging and MRI brain imaging.  MRI brain imaging on 12/16/2023 noted evolution at prior sites of stroke.  Personally  reviewed CMP which appeared relatively within range.  Reviewed CBC noting platelets low at 67.  Of note, patient has ACP documentation on file of signed DNR dated 12/02/2023.  Presented to bedside to meet with patient in the ER.  When presenting to bedside, patient laying in bed minimally able to open eyes.  Very lethargic.  Able to speak with patient's son and granddaughter in person at bedside with patient's daughter on speaker phone.  Introduced myself as a member of the palliative medicine team and my role in patient's medical care.  Patient has been seen by inpatient palliative medicine team previously and so family familiar with palliative.  Spent time learning about patient's stay at rehab facility.  Family describes up until 2-3 days ago, patient was able to interact appropriately and recognize family.  Patient is essentially bedbound at baseline and required assistance being placed into wheelchair for movement.  Over the past 2 to 3 days patient developed worsening cough and confusion which is what prompted her to be brought to hospital.  Patient's medical records from nursing facility were present at bedside so this provider was able to review regarding medication administration.  Family overall noting that they are unsure of next steps and what current evaluation is showed.  Spent time reviewing imaging including MRI and chest x-ray and management with chest tube.  Son noted that he had heard concern that this was a progression of patient's cancer.  Noted would follow-up with Dr. Loretha about evaluation. At this time, noted attempting to find potential causes of patient's encephalopathy that can be reversed.  Family is hopeful patient could get back to a point where she is interacting with them.  Also discussed that if a reversible cause cannot be found, would need to further discuss  patient's quality of life and where to proceed moving forward in terms of medical interventions.  Family acknowledged  this.  Did inquire about ACP documentation.  Granddaughter, [name], noted that she is patient's HCPOA though they all come together as a family including patient's son and daughter to make medical decisions for the patient.  Acknowledged this.  Patient is already appropriately DNR/DNI.  All questions answered at that time.  Noted palliative medicine team to continue to follow patient's medical journey.  Discussed care with PCCM, oncology, bedside RN, and hospitalist to assist with coordinating care moving forward  Primary Diagnoses  Present on Admission:  Pleural effusion  Chronic diastolic CHF (congestive heart failure) (HCC)  History of systemic lupus erythematosus (SLE) (HCC)  Essential hypertension  Paroxysmal atrial flutter (HCC)  Normocytic anemia  Sepsis due to pneumonia (HCC)  UTI (urinary tract infection)  ILD (interstitial lung disease) (HCC)  Hypothyroidism  Acute metabolic encephalopathy  Acute respiratory failure with hypoxia (HCC)  Type 2 diabetes mellitus with other specified complication (HCC)  Prolonged QT interval    Past Medical History:  Diagnosis Date   Acute bronchitis 12/17/2021   Acute cystitis 02/20/2019   Arthritis    Atrial flutter (HCC)    Breast cancer (HCC)    left  2009 and 2025   Diabetes mellitus without complication (HCC)    Dyspnea    mostly with exertion   GERD (gastroesophageal reflux disease)    History of radiation therapy    Left Lung-08/09/23-10/01/23- Dr. 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[facility])    Lupus    Pneumonia 07/31/2021   Social History   Socioeconomic History   Marital status: Single    Spouse name: Not on file   Number of children: 2   Years of education: Not on file   Highest education level: Not on file  Occupational History   Not on file  Tobacco Use   Smoking status: Never    Passive exposure: Never   Smokeless tobacco: Not on file  Vaping Use   Vaping status:  Never Used  Substance and Sexual Activity   Alcohol  use: No   Drug use: No   Sexual activity: Not Currently  Other Topics Concern   Not on file  Social History Narrative   Not on file   Social Drivers of Health   Financial Resource Strain: Not on file  Food Insecurity: Food Insecurity Present (11/09/2023)   Hunger Vital Sign    Worried About Running Out of Food in the Last Year: Sometimes true    Ran Out of Food in the Last Year: Sometimes true  Transportation Needs: No Transportation Needs (11/09/2023)   PRAPARE - Administrator, Civil Service (Medical): No    Lack of Transportation (Non-Medical): No  Physical Activity: Not on file  Stress: Not on file  Social Connections: Moderately Integrated (11/07/2023)   Social Connection and Isolation Panel    Frequency of Communication with Friends and Family: More than three times a week    Frequency of Social Gatherings with Friends and Family: Once a week    Attends Religious Services: More than 4 times per year    Active Member of [employer] or Organizations: Yes    Attends Banker Meetings: 1 to 4 times per year    Marital Status: Divorced   Family History  Problem Relation Age of Onset   Cancer Father  prostate   Prostate cancer Father 66 - 24       metaststic   Asthma Brother    Cancer Daughter        breast   Breast cancer Daughter 46   Scheduled Meds:  sodium chloride  flush  10 mL Intrapleural Q8H   thiamine (VITAMIN B1) injection  100 mg Intravenous Daily   Continuous Infusions:  sodium chloride  100 mL/hr at 12/16/23 0310   doxycycline  (VIBRAMYCIN ) IV Stopped (12/16/23 0637)   PRN Meds:. Allergies  Allergen Reactions   Ciprofloxacin Itching, Swelling and Other (See Comments)    Possibly causing tremors?   Crestor  [Rosuvastatin ] Other (See Comments)    Myalgia and back pain   Lisinopril  Cough   Ofev  [Nintedanib] Nausea Only   CBC:    Component Value Date/Time   WBC 9.6  12/15/2023 1702   HGB 9.5 (L) 12/15/2023 1713   HGB 11.4 (L) 08/31/2023 1349   HGB 11.7 01/07/2023 1439   HCT 28.0 (L) 12/15/2023 1713   HCT 36.4 01/07/2023 1439   PLT 67 (L) 12/15/2023 1702   PLT 203 08/31/2023 1349   PLT 257 01/07/2023 1439   MCV 96.5 12/15/2023 1702   MCV 89 01/07/2023 1439   NEUTROABS 7.0 12/15/2023 1702   NEUTROABS 4.7 04/25/2022 1223   LYMPHSABS 1.9 12/15/2023 1702   LYMPHSABS 1.7 04/25/2022 1223   MONOABS 0.6 12/15/2023 1702   EOSABS 0.0 12/15/2023 1702   EOSABS 0.1 04/25/2022 1223   BASOSABS 0.0 12/15/2023 1702   BASOSABS 0.0 04/25/2022 1223   Comprehensive Metabolic Panel:    Component Value Date/Time   NA 139 12/15/2023 1713   NA 138 01/07/2023 1439   K 3.5 12/15/2023 1713   CL 109 12/15/2023 1713   CO2 17 (L) 12/15/2023 1702   BUN 14 12/15/2023 1713   BUN 12 01/07/2023 1439   CREATININE 0.70 12/15/2023 1713   CREATININE 1.10 (H) 08/31/2023 1349   GLUCOSE 94 12/15/2023 1713   CALCIUM  9.1 12/15/2023 1702   AST 47 (H) 12/15/2023 1702   AST 17 08/31/2023 1349   ALT 32 12/15/2023 1702   ALT 9 08/31/2023 1349   ALKPHOS 84 12/15/2023 1702   BILITOT 1.0 12/15/2023 1702   BILITOT 0.4 08/31/2023 1349   PROT 6.9 12/15/2023 1702   PROT 9.0 (H) 04/25/2022 1223   ALBUMIN 3.3 (L) 12/15/2023 1702   ALBUMIN 4.3 04/25/2022 1223    Physical Exam: Vital Signs: BP (!) 139/58 (BP Location: Right Arm)   Pulse 98   Temp 98 F (36.7 C) (Oral)   Resp (!) 21   SpO2 99%  SpO2: SpO2: 99 % O2 Device: O2 Device: Venturi Mask (Pt taken off bipap per MD and placed on venturi mask) O2 Flow Rate: O2 Flow Rate (L/min): 6 L/min Intake/output summary: No intake or output data in the 24 hours ending 12/16/23 0650 LBM:   Baseline Weight:   Most recent weight:    General: Ill-appearing, lethargic, cachectic, frail Cardiovascular: Tachycardia noted Respiratory: On Venturi mask for respiratory support, left chest tube in place Neuro: Lethargic          Palliative  Performance Scale: 10%              Additional Data Reviewed: Recent Labs    12/15/23 1702 12/15/23 1713  WBC 9.6  --   HGB 8.6* 9.5*  PLT 67*  --   NA 137 139  BUN 15 14  CREATININE 0.72 0.70    Imaging: DG Chest  Portable 1 View EXAM: 1 VIEW(S) XRAY OF THE CHEST 12/15/2023 08:55:00 PM  COMPARISON: 12/15/2023  CLINICAL HISTORY: post chest tube  FINDINGS:  LINES, TUBES AND DEVICES: New pigtail left chest tube in left mid lung zone.  LUNGS AND PLEURA: Near complete resolution of a trace left pleural effusion. Possible trace left apical pneumothorax. Bilateral lower lung zone fibrosis. Residual atelectasis/consolidation at left lung base.  HEART AND MEDIASTINUM: No acute abnormality of the cardiac and mediastinal silhouettes.  BONES AND SOFT TISSUES: No acute osseous abnormality.  IMPRESSION: 1. New left pigtail chest tube in appropriate position with near-complete resolution of prior left pleural effusion. Possible trace left apical pneumothorax. 2. Residual left basilar atelectasis/consolidation.  Electronically signed by: Morgane Naveau MD 12/15/2023 09:18 PM EST RP Workstation: [id number] CT HEAD WO CONTRAST EXAM: CT HEAD WITHOUT CONTRAST 12/15/2023 06:04:15 PM  TECHNIQUE: CT of the head was performed without the administration of intravenous contrast. Automated exposure control, iterative reconstruction, and/or weight based adjustment of the mA/kV was utilized to reduce the radiation dose to as low as reasonably achievable.  COMPARISON: CT Head Dec 02, 2023  CLINICAL HISTORY: Mental status change, unknown cause  FINDINGS:  BRAIN AND VENTRICLES: No acute hemorrhage. 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 No evidence of acute large vascular territory infarct. Similar patchy white matter hypodensities, likely chronic microvascular ischemic  changes. No hydrocephalus. No extra-axial collection. No mass effect or midline shift.  ORBITS: No acute abnormality.  SINUSES: No acute abnormality.  SOFT TISSUES AND SKULL: No acute soft tissue abnormality. No skull fracture.  IMPRESSION: 1. No evidence of acute intracranial abnormality. Please note prior watershed infarcts where occult by CT and an MRI could provide more sensitive evaluation if clinically warranted. 2. 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  Electronically signed by: Gilmore Molt MD 12/15/2023 06:26 PM EST RP Workstation: [id number] DG Chest Port 1 View CLINICAL DATA:  Altered mental status  EXAM: PORTABLE CHEST 1 VIEW  COMPARISON:  CT chest 06/19/2023  FINDINGS: There is a new large left pleural effusion. There is some patchy opacities in the right lung base. There is no pneumothorax. The heart is enlarged, unchanged. No acute fractures are seen.  IMPRESSION: 1. New large left pleural effusion. 2. Patchy opacities in the right lung base may represent atelectasis or infection.  Electronically Signed   By: Greig Pique M.D.   On: 12/15/2023 17:40    I personally reviewed recent imaging.   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  Upon admission, patient initially required support with  BiPAP and imaging obtained noted large left pleural effusion which required chest tube placement.  PCCM and oncology consulted for recommendations.  Since admission patient has continued to receive workup for acute respiratory failure secondary to pleural effusion and altered mental status.  Palliative medicine team consulted to assist with complex medical decision making.  # Complex medical decision making/goals  of care  - Patient unable to participate in complex medical decision making due to underlying medical status.  - Discussed care with patient's granddaughter [name] (reported HCPOA), patient's son, and patient's daughter as detailed above in HPI.  At this time, continuing appropriate medical interventions to determine if there is a reversible cause to patient's change in mental status.  Have discussed with family that should a reversible cause not be found, would need to have further discussions about care planning and quality of life moving forward.  Patient had previously discussed with palliative medicine team during prior hospitalizations that patient would not find good quality of life and being permanently bedbound and/or nonverbal.  Palliative medicine team continuing to engage in conversations as able and appropriate moving forward.  -  Code Status: Limited: Do not attempt resuscitation (DNR) -DNR-LIMITED -Do Not Intubate/DNI     - Patient appropriately DNR/DNI.  Has DNR form on file and ACP documentation from 12/02/2023.  # Psycho-social/Spiritual Support:  - Support System: Daughter, son, granddaughter  # Discharge Planning:  To Be Determined  Thank you for allowing the palliative care team to participate in the care [name].  Tinnie Radar, DO Palliative Care Provider PMT # [phone]  If patient remains symptomatic despite maximum doses, please call PMT at [phone] between 0700 and 1900. Outside of these hours, please call attending, as PMT does not have night  coverage.  Billing based on MDM: High  Problems Addressed: One or more chronic illnesses with severe exacerbation, progression, or side effects of treatment.  Amount and/or Complexity of Data: Category 1:Review of prior external note(s) from each unique source, Review of the result(s) of each unique test, and Assessment requiring an independent historian(s), Category 2:Independent interpretation of a test performed by another physician/other qualified health care professional (not separately reported), and Category 3:Discussion of management or test interpretation with external physician/other qualified health care professional/appropriate source (not separately reported)

## 2023-12-16 NOTE — Assessment & Plan Note (Signed)
Order sliding scale  

## 2023-12-16 NOTE — ED Notes (Signed)
Bladder scanned - 65 ml

## 2023-12-16 NOTE — Progress Notes (Signed)
     Patient Name: [name]           DOB: October 09, 1940  MRN: [id number]      Admission Date: 12/15/2023  Attending Provider: Soledad Ligas, DO  Primary Diagnosis: Pleural effusion   Level of care: Progressive   OVERNIGHT EVENT  Heparin  per pharmacy consult order was placed earlier today for A-fib. Previously on Eliquis .  Per nursing staff, patient is draining sanguinous fluid from chest tube.  ~ 2500 cc output from chest tube. Given high risk of bleed, heparin  order DC'd. Current labs - Platelets 27, hemoglobin 7.5.  No other acute changes reported.  Patient resting without complications Troponin 218, no clear documentation as to event that lead to this being drawn. No acute chest pain reported at this time.  EKG showing sinus tachycardia with ST and T wave abnormalities.  Redraw, follow-up on troponin level.  Could be elevated due to demand ischemia related to acute respiratory failure, A-fib, anemia.    Plan: Discontinue heparin  gtt. Transfuse 1 unit platelets given bleeding Monitor troponin  EKG repeat in the morning   Kathryn Noyce, DNP, ACNPC- AG [facility] [place]

## 2023-12-16 NOTE — Assessment & Plan Note (Addendum)
 Patient not localizing Symptoms been ongoing for over the past 24 hours Discussed with family obtain primary imaging such as MRI Family understands at this point emergent imaging will not change our management will obtain MRI in the morning for prognostication purposes If patient remains unresponsive family did state that she did not wish to have overaggressive intervention such as tube feeds   - most likely multifactorial secondary to combination of  infection  dehydration secondary to decreased by mouth intake,   - Will rehydrate   - treat underlining infection   - Hold contributing medications  Patient with known history of stroke recurrent stroke is a possibility Order MRI of the brain and EEG If abnormal will need neurology consult I suspect very poor prognosis given underlying malignancy and extensive medical problems  - neurological exam appears to be nonfocal but patient unable to cooperate fully  - VBG unremarkable no evidence of hypercarbia   - no history of liver disease ammonia  ordered Ordered palliative care consult

## 2023-12-16 NOTE — Assessment & Plan Note (Signed)
-  SIRS criteria met with      Component Value Date/Time   WBC 9.6 12/15/2023 1702   LYMPHSABS 1.9 12/15/2023 1702   LYMPHSABS 1.7 04/25/2022 1223     tachycardia    RR >20 Today's Vitals   12/15/23 1924 12/15/23 1930 12/15/23 2145 12/16/23 0030  BP:  (!) 158/70 (!) 135/53 (!) 136/56  Pulse:  (!) 139 95 94  Resp:  (!) 34 20 13  Temp: 98.3 F (36.8 C)   98.2 F (36.8 C)  TempSrc: Rectal     SpO2:  100% 100% 100%      -Most likely source being: pulmonary,     Source of sepsis is unknown but given clinical picture will continue to treat   Patient meeting criteria for Severe sepsis with    evidence of end organ damage/organ dysfunction such as    acute metabolic encephalopathy  SBP<90 mmhg or MAP < 65 mmhg,   Acute hypoxia requiring new supplemental oxygen , SpO2: 100 % O2 Flow Rate (L/min): 2 L/min FiO2 (%): 40 % Acute thrombocytopenia with platelet < 100,000      Component Value Date/Time   PLT 67 (L) 12/15/2023 1702   PLT 203 08/31/2023 1349   PLT 257 01/07/2023 1439     - Obtain serial lactic acid and procalcitonin level.  - Initiated IV antibiotics in ER: Antibiotics Given (last 72 hours)     Date/Time Action Medication Dose Rate   12/15/23 2308 New Bag/Given   cefTRIAXone  (ROCEPHIN ) 1 g in sodium chloride  0.9 % 100 mL IVPB 1 g 200 mL/hr       Will continue  on : Rocephin  azithromycin    - await results of blood and urine culture  - Rehydrate  Intravenous fluids were administered,    1:24 AM

## 2023-12-16 NOTE — Progress Notes (Addendum)
 Pt arrived from ED at approx 1530 with ED RN and NT.  This RN, Consulting Civil Engineer, NT in to assist transferring pt.  Chest tube dressing CDI, no crepitus noted, draining sanguineous fluid.  Chest tube attached to suction as ordered.  Pt on 5L VM on arrival.  Stage II on sacrum noted when pt transferred from stretcher to bed.  Pictures taken, area cleansed, foam pad applied.  Pt offloaded to right side.    Pt transported to CT Scan with RN at 1550. Pt back from CT at 1615

## 2023-12-16 NOTE — Progress Notes (Signed)
  Progress Note   Patient: [name] FMW:[id number] DOB: 1940/04/28 DOA: 12/15/2023     2 DOS: the patient was seen and examined on 12/16/2023   Brief hospital course: The patient is a 83 yr old woman who presented from SNF for decreased level of responsiveness and altered mental status.  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 In the ED she was initially in respiratory distress. She was found to have a large left pleural effusion. Pulmonology was consulted and chest tube was placed.  At the time of exam the patient remains non-verbal, but awake. Her baseline is to be awake alert and conversant. Son is at bedside.  CT head ws negative for acute infarcts. MRI is pending.    Assessment and Plan: Altered mental status ith a history of prior CVA, lupus, and stage IIIb lung cancer with CT brain that was negative for acute infarct. MRI brain is pending.  Acute hypoxic respiratory failure  with large pleural effusion. Chest tube placed by pulmonology in the ED. 2L removed.Pulmonology following and fluid sent for studies.  DM II: Follow glucoses with FSBS and SSI.  Hypertension: Blood pressures are currently normotensive   Subjective: The patient is awake, tracking, but non-verbal and not following commands.  Physical Exam: Vitals:   12/16/23 2226 12/17/23 0030 12/17/23 0059 12/17/23 0242  BP: (!) 110/51 (!) 113/46 (!) 121/51 (!) 119/55  Pulse:  (!) 103 100 (!) 113  Resp:  19 19   Temp:  98.3 F (36.8 C) 98.2 F (36.8 C) 97.8 F (36.6 C)  TempSrc:  Oral Axillary Oral  SpO2:  100% 100% 100%   Exam:  Constitutional:  The patient is awake, alert, but not verbally responsive and not following commands. Eyes:  pupils and irises appear normal Normal lids and conjunctivae Respiratory:  No increased work of breathing. No wheezes, rales, or rhonchi No tactile  fremitus Cardiovascular:  Regular rate and rhythm No murmurs, ectopy, or gallups. No lateral PMI. No thrills. Diminishdd breathh sounds at bases. Abdomen:  Abdomen is soft, non-tender, non-distended No hernias, masses, or organomegaly Normoactive bowel sounds.  Musculoskeletal:  No cyanosis, clubbing, or edema Skin:  No rashes, lesions, ulcers palpation of skin: no induration or nodules Neurologic:  CN 2-12 intact Sensation all 4 extremities intact Psychiatric:  Mental status Mood, affect appropriate Orientation to person, place, time  judgment and insight appear intact  Data Reviewed:  CBC, BMP, CT chest  Family Communication: Son at bedside  Disposition: Status is: Inpatient Remains inpatient appropriate because: Medically complex patient requiring close monitoring, subspecialty support, and MRI.  Planned Discharge Destination: Skilled nursing facility    Time spent: 45 minutes  Author: Keaghan Staton, DO 12/16/2023 6:34 PM  For on call review [email or web address].

## 2023-12-16 NOTE — Assessment & Plan Note (Signed)
-   Check TSH continue home medications when able to tolerate po  Synthroid  at 100 mcg po q day Hold for today

## 2023-12-16 NOTE — Assessment & Plan Note (Signed)
 Monitor cbg

## 2023-12-16 NOTE — ED Notes (Signed)
 Patient is responding to voice. Patient said goodmorning and I asked if she was feeling better and patient said yes.

## 2023-12-16 NOTE — Progress Notes (Signed)
 EEG complete - results pending

## 2023-12-16 NOTE — Assessment & Plan Note (Signed)
 In the setting of pleural effusion. Appreciate pulmonology consult Now status post chest tube with improvement Patient is on BiPAP given that patient is significantly altered would attempt to change to facial mask Patient is DO NOT RESUSCITATE DO NOT INTUBATE

## 2023-12-16 NOTE — Assessment & Plan Note (Signed)
 Likely reach contributing to acute hypoxic respiratory failure

## 2023-12-16 NOTE — Plan of Care (Signed)

## 2023-12-16 NOTE — Progress Notes (Addendum)
 [name]   DOB:[birth year]   FM#:[id number]      ASSESSMENT & PLAN:  [name] is an 83 year old female patient who came to the hospital overnight from SNF due to complaints of altered mental status with respiratory distress.  Oncologic history significant for lung cancer and breast cancer.  Medical oncology following.  Left pleural effusion Acute hypoxic respiratory failure Altered mental status - Patient brought to the hospital from SNF overnight due to complaints of respiratory distress with confusion. - Left-sided chest tube intact and draining bloody drainage. - Chest x-ray done showed left pleural effusion. - Continue respiratory therapy as ordered - Pulmonary following  Malignant neoplasm of bronchus and lung, stage IIIb - Diagnosed 07/22/2023 - Status post radiation therapy from July to September 2025.  Forward patient had radiation esophagitis and malnutrition. -- Seen in [facility] 10/29/23 and discussed per documentation that she is not a candidate for concurrent chemoradiation or immunotherapy alone based on TMB status.  Plan was to continue to follow-up with periodic imaging. -Medical oncology/Dr. Loretha following  Malignant neoplasm of breast, stage Ib - Diagnosed 01/15/2023 - Status postmastectomy right breast - On anastrozole . - Medical oncology/Dr. Loretha following  Generalized weakness Failure to thrive - Continue supportive care - Palliative team following    Code Status DNR-Limited  Subjective:  Patient seen lethargic, weak appearing laying in bed in ED.  Did not respond verbally, responded with grunts only.  However it is noted she did speak when another family member entered the room and spoke to her.  Family members at bedside.  Left sided chest tube intact and draining bloody drainage.  Objective:   Intake/Output Summary (Last 24 hours) at 12/16/2023 0939 Last data filed at 12/16/2023 0210 Gross per 24 hour  Intake --  Output 1900 ml  Net -1900 ml      PHYSICAL EXAMINATION: ECOG PERFORMANCE STATUS: 4 - Bedbound  Vitals:   12/16/23 0630 12/16/23 0641  BP: (!) 139/56 (!) 139/58  Pulse: 99 98  Resp: 20 (!) 21  Temp:  98 F (36.7 C)  SpO2: 99% 99%   There were no vitals filed for this visit.  GENERAL: + Lethargic, + respiratory distress + chronically ill-appearing SKIN: skin color, texture, turgor are normal, no rashes or significant lesions EYES: normal, conjunctiva are pink and non-injected, sclera clear OROPHARYNX: no exudate, no erythema and lips, buccal mucosa, and tongue normal  NECK: supple, thyroid  normal size, non-tender, without nodularity LYMPH: no palpable lymphadenopathy in the cervical, axillary or inguinal LUNGS: +diminished to auscultation and percussion with normal breathing effort HEART: regular rate & rhythm and no murmurs and no lower extremity edema ABDOMEN: abdomen soft, non-tender and normal bowel sounds MUSCULOSKELETAL: no cyanosis of digits and no clubbing  PSYCH: +lethargic    All questions were answered. The patient knows to call the clinic with any problems, questions or concerns.   The total time spent in the appointment was 40 minutes encounter with patient including review of chart and various tests results, discussions about plan of care and coordination of care plan  Kathryn JINNY Brunner, NP 12/16/2023 9:39 AM    Labs Reviewed:  Lab Results  Component Value Date   WBC 9.6 12/15/2023   HGB 9.5 (L) 12/15/2023   HCT 28.0 (L) 12/15/2023   MCV 96.5 12/15/2023   PLT 67 (L) 12/15/2023   Recent Labs    11/06/23 1258 11/10/23 0504 11/11/23 0558 12/08/23 0430 12/09/23 0517 12/15/23 1702 12/15/23 1713  NA  133* 131*   < > 134* 131* 137 139  K 3.8 4.3   < > 3.7 3.5 3.5 3.5  CL 99 99   < > 106 107 107 109  CO2 24 23   < > 19* 17* 17*  --   GLUCOSE 95 107*   < > 96 95 95 94  BUN 10 15   < > 19 13 15 14   CREATININE 1.01* 0.91   < > 0.85 0.75 0.72 0.70  CALCIUM  9.4 9.1   < > 8.4* 8.3* 9.1   --   GFRNONAA 56* >60   < > >60 >60 >60  --   PROT 8.0 7.0  --   --   --  6.9  --   ALBUMIN 3.7 3.1*  --   --   --  3.3*  --   AST 32 51*  --   --   --  47*  --   ALT 20 37  --   --   --  32  --   ALKPHOS 64 69  --   --   --  84  --   BILITOT 0.5 0.7  --   --   --  1.0  --    < > = values in this interval not displayed.    Studies Reviewed:  MR BRAIN W WO CONTRAST Result Date: 12/16/2023 EXAM: MRI BRAIN WITH AND WITHOUT CONTRAST 12/16/2023 08:56:09 AM TECHNIQUE: Multiplanar multisequence MRI of the head/brain was performed with and without the administration of intravenous contrast. COMPARISON: Brain MRI 11/06/2023. CLINICAL HISTORY: 83 year old female with history of widely scattered small infarcts in bilateral brain and cerebellum last month. FINDINGS: BRAIN AND VENTRICLES: Stable major vascular flow voids. Evolution of abnormal diffusion since the MRI last month. Areas of patchy and more confluent trace diffusion abnormality are now present in the superior hemisphere watershed areas (series 5 image 25), but are no longer restricted on ADC (Apparent Diffusion Coefficient). New areas of ischemia are identified, and punctate foci in the periventricular white matter and involving the left caudate nucleus still appear faintly diffusion restricted (series 5 image 19). Petechial hemorrhage has mildly progressed in the left MCA (Middle Cerebral Artery) and bilateral PCA (Posterior Cerebral Artery) cortical areas (series 10 image 47 on the left). No malignant hemorrhagic transformation. Progressive confluent cytotoxic edema related T2 and FLAIR hyperintensity is present in both hemispheres, but without mass effect, developing cortical encephalomalacia in some areas. No mass effect or midline shift. No hydrocephalus. The sella is unremarkable. No mass or abnormal enhancement. ORBITS: No acute abnormality. SINUSES: No acute abnormality. BONES AND SOFT TISSUES: Normal bone marrow signal and enhancement. No  acute soft tissue abnormality. IMPRESSION: 1. No progressive ischemia since last month. Evolution of widely scattered infarcts which were suggestive of embolic etiology. Increased areas of petechial hemorrhage, but no malignant hemorrhagic transformation. No intracranial mass effect. 2. No new intracranial abnormality. Electronically signed by: Helayne Hurst MD 12/16/2023 09:05 AM EST RP Workstation: [id number]   DG Chest Portable 1 View Result Date: 12/15/2023 EXAM: 1 VIEW(S) XRAY OF THE CHEST 12/15/2023 08:55:00 PM COMPARISON: 12/15/2023 CLINICAL HISTORY: post chest tube FINDINGS: LINES, TUBES AND DEVICES: New pigtail left chest tube in left mid lung zone. LUNGS AND PLEURA: Near complete resolution of a trace left pleural effusion. Possible trace left apical pneumothorax. Bilateral lower lung zone fibrosis. Residual atelectasis/consolidation at left lung base. HEART AND MEDIASTINUM: No acute abnormality of the cardiac and mediastinal silhouettes. BONES AND SOFT  TISSUES: No acute osseous abnormality. IMPRESSION: 1. New left pigtail chest tube in appropriate position with near-complete resolution of prior left pleural effusion. Possible trace left apical pneumothorax. 2. Residual left basilar atelectasis/consolidation. Electronically signed by: Morgane Naveau MD 12/15/2023 09:18 PM EST RP Workstation: [id number]   CT HEAD WO CONTRAST Result Date: 12/15/2023 EXAM: CT HEAD WITHOUT CONTRAST 12/15/2023 06:04:15 PM TECHNIQUE: CT of the head was performed without the administration of intravenous contrast. Automated exposure control, iterative reconstruction, and/or weight based adjustment of the mA/kV was utilized to reduce the radiation dose to as low as reasonably achievable. COMPARISON: CT Head Dec 02, 2023 CLINICAL HISTORY: Mental status change, unknown cause FINDINGS: BRAIN AND VENTRICLES: No acute hemorrhage. Subtle cortical hyperdesnity along the left parietooccipital convexity is compatible with laminar  necrosis from prior watershed infarcts. No evidence of acute large vascular territory infarct. Similar patchy white matter hypodensities, likely chronic microvascular ischemic changes. No hydrocephalus. No extra-axial collection. No mass effect or midline shift. ORBITS: No acute abnormality. SINUSES: No acute abnormality. SOFT TISSUES AND SKULL: No acute soft tissue abnormality. No skull fracture. IMPRESSION: 1. No evidence of acute intracranial abnormality. Please note prior watershed infarcts where occult by CT and an MRI could provide more sensitive evaluation if clinically warranted. 2. 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 Electronically signed by: Gilmore Molt MD 12/15/2023 06:26 PM EST RP Workstation: [id number]   DG Chest Port 1 View Result Date: 12/15/2023 CLINICAL DATA:  Altered mental status EXAM: PORTABLE CHEST 1 VIEW COMPARISON:  CT chest 06/19/2023 FINDINGS: There is a new large left pleural effusion. There is some patchy opacities in the right lung base. There is no pneumothorax. The heart is enlarged, unchanged. No acute fractures are seen. IMPRESSION: 1. New large left pleural effusion. 2. Patchy opacities in the right lung base may represent atelectasis or infection. Electronically Signed   By: Greig Pique M.D.   On: 12/15/2023 17:40   CT HEAD WO CONTRAST ( ) Result Date: 12/02/2023 EXAM: CT HEAD WITHOUT CONTRAST 12/02/2023 03:21:42 PM TECHNIQUE: CT of the head was performed without the administration of intravenous contrast. Automated exposure control, iterative reconstruction, and/or weight based adjustment of the mA/kV was utilized to reduce the radiation dose to as low as reasonably achievable. COMPARISON: 11/06/2023 CLINICAL HISTORY: Mental status change, persistent or worsening FINDINGS: BRAIN AND VENTRICLES: No acute hemorrhage. No evidence of acute infarct. Nonspecific periventricular and subcortical white matter hypoattenuation,  consistent with chronic microvascular ischemic changes. Prominent ventricles suggesting underlying parenchymal volume loss. No extra-axial collection. No mass effect or midline shift. Atherosclerotic calcifications of carotid siphons. ORBITS: No acute abnormality. SINUSES: Mucosal thickening in right maxillary sinus and left sphenoid sinus. Frothy material in left sphenoid sinus. SOFT TISSUES AND SKULL: No acute soft tissue abnormality. No skull fracture. IMPRESSION: 1. No acute intracranial abnormality. 2. Prominent ventricles suggesting underlying parenchymal volume loss. 3. Nonspecific periventricular and subcortical white matter hypoattenuation consistent with chronic microvascular ischemic changes. 4. Paranasal sinus disease involving the right maxillary and left sphenoid sinuses, with frothy material in the left sphenoid sinus. Electronically signed by: Lonni Necessary MD 12/02/2023 06:58 PM EST RP Workstation: [id number]   DG Cervical Spine 2 or 3 views Result Date: 11/27/2023 CLINICAL DATA:  Left neck pain EXAM: CERVICAL SPINE - 2 VIEW COMPARISON:  CT cervical spine dated 06/19/2023 FINDINGS: There is no evidence of cervical spine fracture or prevertebral soft tissue swelling. Straightening of the cervical lordosis. Again seen are multilevel degenerative changes of the cervical spine characterized  by intervertebral disc space narrowing and disc osteophyte formation. IMPRESSION: 1. No acute fracture or traumatic listhesis. 2. Unchanged multilevel degenerative changes of the cervical spine. Electronically Signed   By: Limin  Xu M.D.   On: 11/27/2023 15:12   DG Shoulder Left Result Date: 11/24/2023 CLINICAL DATA:  Pain. EXAM: LEFT SHOULDER - 2+ VIEW COMPARISON:  None Available. FINDINGS: There is no evidence of fracture or dislocation. Mild acromioclavicular degenerative spurring. Subcortical cystic change in the lateral humeral head. No erosion or bony destructive change. Soft tissues are  unremarkable. IMPRESSION: 1. Mild acromioclavicular degenerative change. 2. Subcortical cystic change in the lateral humeral head, can be seen with rotator cuff arthropathy. Electronically Signed   By: Andrea Gasman M.D.   On: 11/24/2023 15:02   DG ESOPHAGUS W SINGLE CM (SOL OR THIN BA) Result Date: 11/23/2023 CLINICAL DATA:  Inpatient being treated for recent CVA with c/o dysphagia. Exam limited by mobility and some views limited by kyphotic posture. EXAM: ESOPHAGUS/BARIUM SWALLOW/TABLET STUDY TECHNIQUE: Single contrast examination was performed using thin liquid barium. This exam was performed by Laymon Coast, NP, and was supervised and interpreted by Dr. Johann. Images obtained in LPO and RPO positioning. FLUOROSCOPY: Radiation Exposure Index (as provided by the fluoroscopic device): 15 mGy Kerma COMPARISON:  None Available. FINDINGS: Swallowing: Appears normal. No vestibular penetration or aspiration seen. Pharynx: Unremarkable. Esophagus: Normal appearance. Esophageal motility: Esophageal dysmotility seen as tertiary contractions resulting in contrast stasis within the mid and distal esophagus. Hiatal Hernia: Small sliding type hiatal hernia seen. Gastroesophageal reflux: None visualized. Ingested 13 mm barium tablet: Passed without significant delay to the stomach. Other: None. IMPRESSION: 1.  Esophageal dysmotility. 2.  Small sliding type hiatal hernia. Electronically Signed   By: JONETTA Johann M.D.   On: 11/23/2023 12:17   DG Swallowing Func-Speech Pathology Result Date: 11/23/2023 Table formatting from the original result was not included. Modified Barium Swallow Study Patient Details Name: ARIELYS WANDERSEE MRN: [id number] Date of Birth: Feb 13, 1940 Today's Date: 11/23/2023 HPI/PMH: HPI: [name] is a 83 y.o. female with a history of atrial fibrillation on Eliquis , recently diagnosed adenocarcinoma of the lungs on radiation, history of breast cancer who was recently discharged after NSTEMI  who presented on 11/06/2023 with slurred speech and right-sided weakness.       CT of the head revealed large hypodensity on the right concerning for large territory infarct.  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  Stroke consistent with embolic.  Patient apparently missed Eliquis  doses. Echo shows EF 60%, increased RVSP at 36.9 mmHg, mild MR, moderate AR. Continue aspirin , statin.  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 S/p radiation, on anastrozole .She is at risk for strokes due to her cancer history. Therapies are recommending intensive rehab. Clinical Impression: Patient presents with a functional oropharyngeal swallow. Oral phase is remarkable for mild oral residuals and mildly prolonged mastication times (patient with missing dentition) however patient is able to clear w/ additional swallows or liquid wash. Pharyngeal phase is remarkable for complete epiglottic inversion, laryngeal vestibular closure and hyoid excursion. Noted x1 instance of trace penetration, however no other instances of penetration/aspiration across all consistencies tested. Full clearance of pharyngeal space. Patient unable to transit pill from oral cavity, therefore expectorated. Recommend continuation of D3/thin liquid diet with medications crushed in puree. Patient should receive physical and  verbal assistance during meals to cue for head up posture. Factors that may increase risk of adverse event in presence of aspiration Noe & Lianne 2021): No data recorded Recommendations/Plan: Swallowing Evaluation Recommendations Swallowing Evaluation Recommendations Recommendations: PO diet PO Diet Recommendation: Dysphagia 3 (Mechanical soft); Thin liquids (Level 0) Liquid Administration via: Straw  Medication Administration: Crushed with puree Supervision: Full assist for feeding Swallowing strategies  : Slow rate; Small bites/sips Postural changes: Position pt fully upright for meals Oral care recommendations: Oral care BID (2x/day) Treatment Plan Treatment Plan Treatment recommendations: Therapy as outlined in treatment plan below Follow-up recommendations: Follow physicians's recommendations for discharge plan and follow up therapies Functional status assessment: Patient has had a recent decline in their functional status and demonstrates the ability to make significant improvements in function in a reasonable and predictable amount of time. Treatment frequency: Min 2x/week Treatment duration: 2 weeks Interventions: Compensatory techniques; Patient/family education; Diet toleration management by SLP Recommendations Recommendations for follow up therapy are one component of a multi-disciplinary discharge planning process, led by the attending physician.  Recommendations may be updated based on patient status, additional functional criteria and insurance authorization. Assessment: Orofacial Exam: Orofacial Exam Oral Cavity - Dentition: Edentulous Orofacial Anatomy: WFL Anatomy: Anatomy: WFL Boluses Administered: Boluses Administered Boluses Administered: Thin liquids (Level 0); Mildly thick liquids (Level 2, nectar thick); Puree; Solid  Oral Impairment Domain: Oral Impairment Domain Lip Closure: Interlabial escape, no progression to anterior lip Tongue control during bolus hold: Cohesive bolus between tongue to palatal seal Bolus preparation/mastication: Slow prolonged chewing/mashing with complete recollection Bolus transport/lingual motion: Brisk tongue motion Oral residue: Trace residue lining oral structures Location of oral residue : Tongue Initiation of pharyngeal swallow : Posterior laryngeal surface of the epiglottis  Pharyngeal Impairment Domain: Pharyngeal Impairment Domain Soft palate elevation: No  bolus between soft palate (SP)/pharyngeal wall (PW) Laryngeal elevation: Complete superior movement of thyroid  cartilage with complete approximation of arytenoids to epiglottic petiole Anterior hyoid excursion: Complete anterior movement Epiglottic movement: Complete inversion Laryngeal vestibule closure: Complete, no air/contrast in laryngeal vestibule Pharyngeal stripping wave : Present - complete Pharyngeal contraction (A/P view only): N/A Pharyngoesophageal segment opening: Complete distension and complete duration, no obstruction of flow Tongue base retraction: No contrast between tongue base and posterior pharyngeal wall (PPW) Pharyngeal residue: Trace residue within or on pharyngeal structures  Esophageal Impairment Domain: Esophageal Impairment Domain Esophageal clearance upright position: Complete clearance, esophageal coating Pill: Pill Consistency administered: -- (expectorated) Penetration/Aspiration Scale Score: Penetration/Aspiration Scale Score 1.  Material does not enter airway: Puree; Solid; Mildly thick liquids (Level 2, nectar thick) 2.  Material enters airway, remains ABOVE vocal cords then ejected out: Thin liquids (Level 0) Compensatory Strategies: Compensatory Strategies Compensatory strategies: Yes Straw: Effective Effective Straw: Thin liquid (Level 0); Mildly thick liquid (Level 2, nectar thick)   General Information: No data recorded Diet Prior to this Study: Dysphagia 3 (mechanical soft); Thin liquids (Level 0)   No data recorded  Respiratory Status: WFL   Supplemental O2: None (Room air)   No data recorded Behavior/Cognition: Alert; Lethargic/Drowsy Self-Feeding Abilities: Able to self-feed Baseline vocal quality/speech: Normal Volitional Cough: Able to elicit Volitional Swallow: Able to elicit Exam Limitations: Poor positioning Goal Planning: Prognosis for improved oropharyngeal function: Good No data recorded No data recorded No data recorded Consulted and agree with results and  recommendations: Patient; Nurse Pain: No data recorded End of Session: Start Time:No data recorded Stop Time: No data recorded Time Calculation:No data recorded Charges: No data recorded SLP visit  diagnosis: SLP Visit Diagnosis: Dysphagia, unspecified (R13.10) Past Medical History: Past Medical History: Diagnosis Date  Acute bronchitis 12/17/2021  Acute cystitis 02/20/2019  Arthritis   Atrial flutter (HCC)   Breast cancer (HCC)   left  2009 and 2025  Diabetes mellitus without complication (HCC)   Dyspnea   mostly with exertion  GERD (gastroesophageal reflux disease)   History of radiation therapy   Left Lung-08/09/23-10/01/23- Dr. 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[facility])   Lupus   Pneumonia 07/31/2021 Past Surgical History: Past Surgical History: Procedure Laterality Date  BREAST BIOPSY Left 01/15/2023  US  LT BREAST BX W LOC DEV 1ST LESION IMG BX SPEC US  GUIDE 01/15/2023 [facility] MAMMOGRAPHY  BREAST LUMPECTOMY Left 2009  BRONCHIAL BIOPSY  06/29/2023  Procedure: BRONCHOSCOPY, WITH BIOPSY;  Surgeon: [name], MD;  Location: [facility] ENDOSCOPY;  Service: Pulmonary;;  BRONCHIAL BRUSHINGS  06/29/2023  Procedure: BRONCHOSCOPY, WITH BRUSH BIOPSY;  Surgeon: [name], MD;  Location: [facility] ENDOSCOPY;  Service: Pulmonary;;  BRONCHIAL NEEDLE ASPIRATION BIOPSY  06/29/2023  Procedure: BRONCHOSCOPY, WITH NEEDLE ASPIRATION BIOPSY;  Surgeon: [name], MD;  Location: [facility] ENDOSCOPY;  Service: Pulmonary;;  IR 3D INDEPENDENT WKST  05/29/2021  IR ANGIO INTRA EXTRACRAN SEL COM CAROTID INNOMINATE BILAT MOD SED  05/29/2021  IR ANGIO VERTEBRAL SEL VERTEBRAL BILAT MOD SED  05/29/2021  LEFT HEART CATH AND CORONARY ANGIOGRAPHY N/A 11/01/2023  Procedure: LEFT HEART CATH AND CORONARY ANGIOGRAPHY;  Surgeon: Wonda Sharper, MD;  Location: [facility] INVASIVE CV LAB;  Service: Cardiovascular;  Laterality: N/A;  SIMPLE MASTECTOMY WITH AXILLARY SENTINEL NODE BIOPSY Left 03/31/2023  Procedure: LEFT MASTECTOMY;  Surgeon:  Vernetta Berg, MD;  Location: [facility] OR;  Service: General;  Laterality: Left;  LMA PEC BLOCK  VIDEO BRONCHOSCOPY WITH ENDOBRONCHIAL NAVIGATION Left 06/29/2023  Procedure: VIDEO BRONCHOSCOPY WITH ENDOBRONCHIAL NAVIGATION;  Surgeon: [name], MD;  Location: [facility] ENDOSCOPY;  Service: Pulmonary;  Laterality: Left;  VIDEO BRONCHOSCOPY WITH ENDOBRONCHIAL ULTRASOUND Bilateral 06/29/2023  Procedure: BRONCHOSCOPY, WITH EBUS;  Surgeon: [name], MD;  Location: [facility] ENDOSCOPY;  Service: Pulmonary;  Laterality: Bilateral; Blaise FALCON [place] 11/23/2023, 10:22 AM  DG Chest 2 View Result Date: 11/19/2023 CLINICAL DATA:  Cough. EXAM: DG CHEST 2V COMPARISON:  Radiograph 10/30/2023, CT 06/19/2023 FINDINGS: Upper normal heart size. Stable mediastinal contours with aortic atherosclerosis. Scattered areas of parenchymal scarring. Peripheral left lower lobe opacity may be related to treatment related changes, lung nodule on prior CT. Small left pleural effusion. No pulmonary edema. No pneumothorax. IMPRESSION: Peripheral left lower lobe opacity may be related to treatment related changes, lung nodule on prior CT. Small left pleural effusion. Electronically Signed   By: Andrea Gasman M.D.   On: 11/19/2023 20:15   Attending Note  I personally saw the patient, reviewed the chart and examined the patient. The plan of care was discussed with the patient and the admitting team. I agree with the assessment and plan as documented above. Thank you very much for the consultation. I saw the pt, discussed with family. She is not doing well. She was moaning and confused at the time of my visit. I am not entirely clear that there is evidence of cancer progression, but even at baseline she had borderline PS Since her treatment for lung cancer, she had multiple hospitalizations and has gradually declined We discussed that even if there is concern for cancer progression, she will not be a candidate for further treatment We  discussed about palliative care measures  including hospice Family wants to see if she makes any progress  in the next couple days. If she continues to do unwell, they will discuss with palliative care team again about goals of care again Daughter and sister are agreeable that the focus should be on keeping her comfortable but they are hoping she improves Thrombocytopenia noted, please hold anticoagulation unless plts are over 30 K Please consider PRBC to maintain a Hb of 7-8 g/dl I wonder if there is any hemorrhagic pleural effusion given the drop in Hb, NO evidence of hemolysis. Even during the hospitalization since her

## 2023-12-16 NOTE — Assessment & Plan Note (Signed)
 Come off of Rocephin  for now await results of urine culture

## 2023-12-16 NOTE — Assessment & Plan Note (Signed)
 With chronic right side hemiplegia For the past few days appears to be significantly altered and now progressively getting more lethargic and poorly responsive Order MRI with and without contrast and EEG Discussed with family will look for reversible causes  Palliative care consult Sent msg to oncology to discuss goals of care with family as well No recent fever or neck stiffness

## 2023-12-16 NOTE — Progress Notes (Signed)
 Clinical/Bedside Swallow Evaluation Patient Details  Name: [name] MRN: [id number] Date of Birth: 1940-10-25  Today's Date: 12/16/2023 Time: SLP Start Time (ACUTE ONLY): 1805 SLP Stop Time (ACUTE ONLY): 1840 SLP Time Calculation (min) (ACUTE ONLY): 35 min  Past Medical History:  Past Medical History:  Diagnosis Date   Acute bronchitis 12/17/2021   Acute cystitis 02/20/2019   Arthritis    Atrial flutter (HCC)    Breast cancer (HCC)    left  2009 and 2025   Diabetes mellitus without complication (HCC)    Dyspnea    mostly with exertion   GERD (gastroesophageal reflux disease)    History of radiation therapy    Left Lung-08/09/23-10/01/23- Dr. 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[facility])    Lupus    Pneumonia 07/31/2021   Past Surgical History:  Past Surgical History:  Procedure Laterality Date   BREAST BIOPSY Left 01/15/2023   US  LT BREAST BX W LOC DEV 1ST LESION IMG BX SPEC US  GUIDE 01/15/2023 [facility] MAMMOGRAPHY   BREAST LUMPECTOMY Left 2009   BRONCHIAL BIOPSY  06/29/2023   Procedure: BRONCHOSCOPY, WITH BIOPSY;  Surgeon: [name], MD;  Location: [facility] ENDOSCOPY;  Service: Pulmonary;;   BRONCHIAL BRUSHINGS  06/29/2023   Procedure: BRONCHOSCOPY, WITH BRUSH BIOPSY;  Surgeon: [name], MD;  Location: [facility] ENDOSCOPY;  Service: Pulmonary;;   BRONCHIAL NEEDLE ASPIRATION BIOPSY  06/29/2023   Procedure: BRONCHOSCOPY, WITH NEEDLE ASPIRATION BIOPSY;  Surgeon: [name], MD;  Location: [facility] ENDOSCOPY;  Service: Pulmonary;;   ESOPHAGOGASTRODUODENOSCOPY N/A 12/07/2023   Procedure: EGD (ESOPHAGOGASTRODUODENOSCOPY);  Surgeon: Saintclair Jasper, MD;  Location: [facility] ENDOSCOPY;  Service: Gastroenterology;  Laterality: N/A;   IR 3D INDEPENDENT WKST  05/29/2021   IR ANGIO INTRA EXTRACRAN SEL COM CAROTID INNOMINATE BILAT MOD SED  05/29/2021   IR ANGIO VERTEBRAL SEL VERTEBRAL BILAT MOD SED  05/29/2021   LEFT HEART CATH AND CORONARY ANGIOGRAPHY N/A  11/01/2023   Procedure: LEFT HEART CATH AND CORONARY ANGIOGRAPHY;  Surgeon: Wonda Sharper, MD;  Location: [facility] INVASIVE CV LAB;  Service: Cardiovascular;  Laterality: N/A;   SIMPLE MASTECTOMY WITH AXILLARY SENTINEL NODE BIOPSY Left 03/31/2023   Procedure: LEFT MASTECTOMY;  Surgeon: Vernetta Berg, MD;  Location: [facility] OR;  Service: General;  Laterality: Left;  LMA PEC BLOCK   VIDEO BRONCHOSCOPY WITH ENDOBRONCHIAL NAVIGATION Left 06/29/2023   Procedure: VIDEO BRONCHOSCOPY WITH ENDOBRONCHIAL NAVIGATION;  Surgeon: [name], MD;  Location: [facility] ENDOSCOPY;  Service: Pulmonary;  Laterality: Left;   VIDEO BRONCHOSCOPY WITH ENDOBRONCHIAL ULTRASOUND Bilateral 06/29/2023   Procedure: BRONCHOSCOPY, WITH EBUS;  Surgeon: [name], MD;  Location: [facility] ENDOSCOPY;  Service: Pulmonary;  Laterality: Bilateral;   HPI:  Per palliative MD note STARIA [name] is a 83 y.o. female with a history of  lung carcinoma Diagnosed 07/22/2023  - Status post radiation therapy from July to September 2025. Pt with subsequent radiation esophagitis and malnutrition.  -- Seen in [facility] 10/29/23 and discussed per documentation that she is not a candidate for concurrent chemoradiation or immunotherapy alone based on TMB status.  Plan was to continue to follow-up with periodic imaging.  10/2023 pt suffered slurred speech and right-sided weakness with CT of the head showing large hypodensity on the right concerning for large territory infarct.  MRI of the brain revealed multiple acute infarcts in bilateral watershed distribution involving the convexities, inferior right frontal lobe, right caudate head, posterior parietal and occipital lobes, right lateral  pons and posterior cerebellum.   11/23/2023 MBS revealed functional swallow with recommendations for dys3/thin as pt had oral retention requiring liquids or extra time to clear.  She also did not swallow a tablet with liquids.  She has undergone prior EGD 11/11 conducted for anemia - and was  normal except 2 cm hiatal hernia and pt was found to have blood in her oral cavity from epistaxis.  She has been at [facility] and is essentially bed/chair bound with increased cough/congestion recently. Required a chest tube placement - CT chest  Small left pneumothorax, less than 10%. Left chest tube in place.  2. Bibasilar atelectasis and pneumonia. Underlying mass is not  excluded.  3. Small left pleural effusion.  4.  Aortic Atherosclerosis (ICD10-I70.0).  Swallow eval ordered.  Pt has been lethargic throughout the day and is on facemask oxygen  of approx 6 liters.  Family present *daughter reports her po intake has been very poor since her Stroke. She spent time on CIR at [facility] and then transferred to [facility] last Thursday  *Nov 13th.    Assessment / Plan / Recommendation  Clinical Impression  At this time, patient is grossly weak overall, demonstrates weak voice - answering in very low volume to waterand mostly only moans. Concern for oral candidiasis present as coating remained after oral care.    Only provided pt with water via tsp and small dixie cup due to her fatigue/lethargy - as this provided her with the most comfort.  She is too lethargic for PO at this time. Her daughter reports that the pt has not been eating but a few bites/sips since her strokes. Question if she could have aspirated from her recent epistaxis event.    Recommend frequent oral care and sips of room temperature water for comfort. Will follow up next date for further diagnostic testing - hopefully with improved mentation.   Concern for pt to consume adequate nutrition and aspiration risk is present given poor intake and function PTA and current compromised medical status.   Note she is being followed by palliative care and oncology.  Suspect significant exacerbation of baseline deficits due to comorbidities.  Comfort PO may be a reasonable plan for this patient.   Family educated to plan and sign posted above bed for reference.   They are in agreement with current plan.  Messaged MD re: potential oral candidiasis.     SLP Visit Diagnosis: Dysphagia, oropharyngeal phase (R13.12)    Aspiration Risk  Moderate aspiration risk;Risk for inadequate nutrition/hydration    Diet Recommendation NPO except meds;Free water protocol after oral care    Liquid Administration via: Cup;Spoon Medication Administration: Crushed with puree Supervision: Full supervision/cueing for compensatory strategies    Other  Recommendations Oral Care Recommendations: Oral care QID Caregiver Recommendations: Have oral suction available     Assistance Recommended at Discharge  TBD  Functional Status Assessment Patient has had a recent decline in their functional status and/or demonstrates limited ability to make significant improvements in function in a reasonable and predictable amount of time  Frequency and Duration min 1 x/week  1 week       Prognosis Prognosis for improved oropharyngeal function: Guarded Barriers to Reach Goals: Severity of deficits;Other (Comment)      Swallow Study   General Date of Onset: 12/16/23 HPI: Per palliative MD note NATIA FAHMY is a 83 y.o. female with a history of  lung carcinoma Diagnosed 07/22/2023  - Status post radiation therapy from July to September 2025.  Pt with subsequent radiation esophagitis and malnutrition.  -- Seen in [facility] 10/29/23 and discussed per documentation that she is not a candidate for concurrent chemoradiation or immunotherapy alone based on TMB status.  Plan was to continue to follow-up with periodic imaging.  10/2023 pt suffered slurred speech and right-sided weakness with CT of the head showing large hypodensity on the right concerning for large territory infarct.  MRI of the brain revealed multiple acute infarcts in bilateral watershed distribution involving the convexities, inferior right frontal lobe, right caudate head, posterior parietal and occipital lobes, right lateral pons  and posterior cerebellum.   11/23/2023 MBS revealed functional swallow with recommendations for dys3/thin as pt had oral retention requiring liquids or extra time to clear.  She also did not swallow a tablet with liquids.  She has undergone prior EGD 11/11 conducted for anemia - and was normal except 2 cm hiatal hernia and pt was found to have blood in her oral cavity from epistaxis.  She has been at [facility] and is essentially bed/chair bound with increased cough/congestion recently. Required a chest tube placement - CT chest  Small left pneumothorax, less than 10%. Left chest tube in place.  2. Bibasilar atelectasis and pneumonia. Underlying mass is not  excluded.  3. Small left pleural effusion.  4.  Aortic Atherosclerosis (ICD10-I70.0).  Swallow eval ordered.  Pt has been lethargic throughout the day and is on facemask oxygen  of approx 6 liters.  Family present *daughter reports her po intake has been very poor since her Stroke. She spent time on CIR at [facility] and then transferred to [facility] last Thursday  *Nov 13th. Type of Study: Bedside Swallow Evaluation Previous Swallow Assessment: MBS functional, esophagram dysmotility - Diet Prior to this Study: NPO Temperature Spikes Noted: No Respiratory Status: Venti-mask (six liters) Oral Cavity Assessment: Dry;Other (comment) (suspect oral candidiasis) Oral Care Completed by SLP: Yes (extensive oral care provided for pt) Oral Cavity - Dentition: Other (Comment) Vision: Impaired for self-feeding Self-Feeding Abilities: Total assist Patient Positioning: Upright in bed Baseline Vocal Quality: Low vocal intensity Volitional Cough: Cognitively unable to elicit Volitional Swallow: Unable to elicit    Oral/Motor/Sensory Function Overall Oral Motor/Sensory Function: Mild impairment Facial ROM:  (decreased labial ROM on the right) Facial Strength: Reduced right Lingual ROM:  (generally weak) Velum: Other (comment) (could not view due to venturi mask)   Ice Chips Ice  chips: Not tested   Thin Liquid Thin Liquid: Impaired Presentation: Cup;Spoon Oral Phase Impairments: Reduced labial seal Oral Phase Functional Implications: Right anterior spillage Pharyngeal  Phase Impairments: Suspected delayed Swallow    Nectar Thick Nectar Thick Liquid: Not tested   Honey Thick Honey Thick Liquid: Not tested   Puree Puree: Not tested   Solid  DNT more than water due to pt's mentation, current degree of weakness an respiratory needs.    Solid: Not tested      Nicolas Emmie Caldron 12/16/2023,7:09 PM  Madelin POUR, MS [facility] SLP Acute Rehab Services Office [phone]

## 2023-12-16 NOTE — Procedures (Signed)
 Patient Name: [name]  MRN: [id number]  Epilepsy Attending: Pastor Falling  Referring Physician/Provider: No ref. provider found      Date: 12/16/2023 Duration: 23 minutes   Patient history: 83 year old woman with Altered mental status, EEG to rule out seizure  Level of alertness: Lethargic   AEDs during EEG study: None   Technical aspects: This EEG study was done with scalp electrodes positioned according to the 10-20 International system of electrode placement. Electrical activity was reviewed with band pass filter of 1-70Hz , sensitivity of 7 uV/mm, display speed of 72mm/sec with a 60Hz  notched filter applied as appropriate. EEG data were recorded continuously and digitally stored.  Video monitoring was available and reviewed as appropriate.  Description: EEG showed continuous generalized polymorphic sharply contoured 3 to 6 Hz theta-delta slowing. There were also generalized periodic discharges with triphasic morphology. Hyperventilation and photic stimulation were not performed.     ABNORMALITY - Continuous slow, generalized - Periodic discharges with triphasic morphology, generalized ( GPDs)   IMPRESSION: This study is suggestive of moderate diffuse encephalopathy, nonspecific etiology but likely related to sedation, toxic-metabolic etiology. No seizures or epileptiform discharges were seen throughout the recording.   Pastor Falling MD Neurology

## 2023-12-17 DIAGNOSIS — J9601 Acute respiratory failure with hypoxia: Secondary | ICD-10-CM | POA: Diagnosis not present

## 2023-12-17 DIAGNOSIS — Z8673 Personal history of transient ischemic attack (TIA), and cerebral infarction without residual deficits: Secondary | ICD-10-CM | POA: Diagnosis not present

## 2023-12-17 DIAGNOSIS — J90 Pleural effusion, not elsewhere classified: Secondary | ICD-10-CM | POA: Diagnosis not present

## 2023-12-17 DIAGNOSIS — Z66 Do not resuscitate: Secondary | ICD-10-CM | POA: Diagnosis not present

## 2023-12-17 DIAGNOSIS — R4182 Altered mental status, unspecified: Secondary | ICD-10-CM | POA: Diagnosis not present

## 2023-12-17 DIAGNOSIS — Z515 Encounter for palliative care: Secondary | ICD-10-CM | POA: Diagnosis not present

## 2023-12-17 DIAGNOSIS — Z85118 Personal history of other malignant neoplasm of bronchus and lung: Secondary | ICD-10-CM | POA: Diagnosis not present

## 2023-12-17 LAB — BASIC METABOLIC PANEL WITH GFR
Anion gap: 14 (ref 5–15)
BUN: 20 mg/dL (ref 8–23)
CO2: 15 mmol/L — ABNORMAL LOW (ref 22–32)
Calcium: 8.6 mg/dL — ABNORMAL LOW (ref 8.9–10.3)
Chloride: 112 mmol/L — ABNORMAL HIGH (ref 98–111)
Creatinine, Ser: 0.74 mg/dL (ref 0.44–1.00)
GFR, Estimated: >60 mL/min (ref >60)
Glucose, Bld: 120 mg/dL — ABNORMAL HIGH (ref 70–99)
Potassium: 3.6 mmol/L (ref 3.5–5.1)
Sodium: 141 mmol/L (ref 135–145)

## 2023-12-17 LAB — CBC WITH DIFFERENTIAL/PLATELET
Abs Immature Granulocytes: 0.08 K/uL — ABNORMAL HIGH (ref 0.00–0.07)
Basophils Absolute: 0 K/uL (ref 0.0–0.1)
Basophils Relative: 0 %
Eosinophils Absolute: 0 K/uL (ref 0.0–0.5)
Eosinophils Relative: 0 %
HCT: 28.8 % — ABNORMAL LOW (ref 36.0–46.0)
Hemoglobin: 8.8 g/dL — ABNORMAL LOW (ref 12.0–15.0)
Immature Granulocytes: 1 %
Lymphocytes Relative: 16 %
Lymphs Abs: 1.3 K/uL (ref 0.7–4.0)
MCH: 30.3 pg (ref 26.0–34.0)
MCHC: 30.6 g/dL (ref 30.0–36.0)
MCV: 99.3 fL (ref 80.0–100.0)
Monocytes Absolute: 0.6 K/uL (ref 0.1–1.0)
Monocytes Relative: 7 %
Neutro Abs: 5.9 K/uL (ref 1.7–7.7)
Neutrophils Relative %: 76 %
Platelets: 42 K/uL — ABNORMAL LOW (ref 150–400)
RBC: 2.9 MIL/uL — ABNORMAL LOW (ref 3.87–5.11)
RDW: 17.6 % — ABNORMAL HIGH (ref 11.5–15.5)
WBC: 7.9 K/uL (ref 4.0–10.5)
nRBC: 0 % (ref 0.0–0.2)

## 2023-12-17 LAB — CBC
HCT: 20.1 % — ABNORMAL LOW (ref 36.0–46.0)
Hemoglobin: 6.4 g/dL — CL (ref 12.0–15.0)
MCH: 30.8 pg (ref 26.0–34.0)
MCHC: 31.8 g/dL (ref 30.0–36.0)
MCV: 96.6 fL (ref 80.0–100.0)
Platelets: 72 K/uL — ABNORMAL LOW (ref 150–400)
RBC: 2.08 MIL/uL — ABNORMAL LOW (ref 3.87–5.11)
RDW: 19.1 % — ABNORMAL HIGH (ref 11.5–15.5)
WBC: 9.2 K/uL (ref 4.0–10.5)
nRBC: 0 % (ref 0.0–0.2)

## 2023-12-17 LAB — T3: T3, Total: 62 ng/dL — ABNORMAL LOW (ref 71–180)

## 2023-12-17 LAB — PROTEIN, PLEURAL OR PERITONEAL FLUID: Total protein, fluid: 4.2 g/dL

## 2023-12-17 LAB — GLUCOSE, PLEURAL OR PERITONEAL FLUID: Glucose, Fluid: 88 mg/dL

## 2023-12-17 LAB — TROPONIN T, HIGH SENSITIVITY: Troponin T High Sensitivity: 192 ng/L (ref 0–19)

## 2023-12-17 LAB — GLUCOSE, CAPILLARY
Glucose-Capillary: 118 mg/dL — ABNORMAL HIGH (ref 70–99)
Glucose-Capillary: 107 mg/dL — ABNORMAL HIGH (ref 70–99)
Glucose-Capillary: 116 mg/dL — ABNORMAL HIGH (ref 70–99)
Glucose-Capillary: 111 mg/dL — ABNORMAL HIGH (ref 70–99)
Glucose-Capillary: 113 mg/dL — ABNORMAL HIGH (ref 70–99)

## 2023-12-17 LAB — LACTATE DEHYDROGENASE, PLEURAL OR PERITONEAL FLUID: LD, Fluid: 1120 U/L — ABNORMAL HIGH (ref 3–23)

## 2023-12-17 LAB — URINE CULTURE: Culture: NO GROWTH

## 2023-12-17 LAB — PREPARE RBC (CROSSMATCH)

## 2023-12-17 MED ORDER — SODIUM BICARBONATE 650 MG PO TABS
1300.0000 mg | ORAL_TABLET | Freq: Two times a day (BID) | ORAL | Status: DC
  Administered 2023-12-18: 1300 mg via ORAL
  Filled 2023-12-17 (×2): qty 2

## 2023-12-17 MED ORDER — SODIUM CHLORIDE 0.9 % IV SOLN: INTRAVENOUS | Status: AC

## 2023-12-17 MED ORDER — SODIUM CHLORIDE 0.9% IV SOLUTION: Freq: Once | INTRAVENOUS | Status: AC

## 2023-12-17 NOTE — Progress Notes (Signed)
 Initial Nutrition Assessment  DOCUMENTATION CODES:   Severe malnutrition in context of chronic illness  INTERVENTION:  - NPO. SLP is following.   - Will monitor for diet advancement and/or further goals of care discussions.  NUTRITION DIAGNOSIS:   Severe Malnutrition related to chronic illness (Stage IIIb cancer of the lung) as evidenced by moderate fat depletion, severe muscle depletion, energy intake < or equal to 75% for > or equal to 1 month, percent weight loss (15.5% in 2 months).  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 Admitted for PE.   No family at bedside at time of visit. Patient awake but did not respond to any questions orally.  Per EMR, patient had a 22# or 15.5% weight loss from July to September, which is significant for a time frame of two months. Weight appears more stable since that time however no weight has been taken yet this admission (last weight from 11/11).  Per MD notes, patient has recently been experiencing radiation esophagitis which has resulted in difficulty eating and drinking. Patient was admitted to [facility] last month and was followed by an RD during that time. It is noted that she was only consuming 16-30% averages of meals and was not taking in Ensures consistently.   Patient now NPO. Had SLP eval yesterday and was recommended NPO due to being too lethargic for PO intake. Unfortunately, patient was too lethargic again today for further SLP eval.  Palliative care has been consulted and is following. Current plan is  to allow time for patient's mentation to improve. Will monitor for diet advancement/further GOC discussions.   Medications reviewed and include: Protonix , 100mg  thiamine   Labs reviewed:  HA1C 6.0 (as of 10/11)   NUTRITION - FOCUSED PHYSICAL EXAM:  Flowsheet Row Most Recent Value  Orbital Region Moderate depletion  Upper Arm Region Severe depletion  Thoracic and Lumbar Region Unable to assess  Buccal Region Mild depletion  Temple Region Moderate depletion  Clavicle Bone Region Moderate depletion  Clavicle and Acromion Bone Region Severe depletion  Scapular Bone Region Unable to assess  Dorsal Hand Severe depletion  Patellar Region Moderate depletion  Anterior Thigh Region Moderate depletion  Posterior Calf Region Mild depletion  Edema (RD Assessment) None  Hair Reviewed  Eyes Reviewed  Mouth Unable to assess  Skin Reviewed  Nails Reviewed    Diet Order:   Diet Order             Diet NPO time specified  Diet effective now                   EDUCATION NEEDS:  Not appropriate for education at this time  Skin:  Skin Assessment: Skin Integrity Issues: Skin Integrity Issues:: Stage II Stage II: Sacrum  Last BM:  PTA  Height:  Ht Readings from Last 1 Encounters:  12/13/23 5' 6.5 (1.689 m)   Weight:  Wt Readings from Last 1 Encounters:  12/07/23 55 kg    BMI:  There is no height or weight on file to calculate BMI.  Estimated Nutritional  Needs:  Kcal:  1650-1800 kcals Protein:  65-85 grams Fluid:  >/= 1.6L    Trude Ned RD, LDN Contact via Secure Chat.

## 2023-12-17 NOTE — Progress Notes (Signed)
 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  Recent admit 10/14- 11/13 with acute CVA discharged to CIR then to SNF on 11/13.  Recently having esophagitis after radiation with some difficulty in eating and drinking.  Presented from SNF with 3 days of AMS, some cough but no known fever.  In ER, afebrile, WBC 9.6, H/H 8.6/ 27, plts 67 (previously 58), lactic 1.8, PCT 0.12, UA neg.  CTH neg.  CXR showed large left pleural effusion and right base opacities vs atelectasis.  Developed respiratory distress in ER, placed on BiPAP and underwent CT placement in ER with 2L drained and sent for pleural studies, culture and cytology.  Started empirically on ceftriaxone  and doxycycline . Admitted to [facility].  Pt is DNR/ DNI.  Pulmonary consulted for further input with left pleural effusion and CT management.   Pt remains encephalopathic.  Son at bedside but verbalizes he lives out of town and his he is unsure of specific details, states pt was living with his sister prior to hospitalizations and she would know more.      Pertinent  Medical History   Past Medical History:  Diagnosis Date   Acute bronchitis 12/17/2021   Acute cystitis 02/20/2019   Arthritis    Atrial flutter (HCC)    Breast cancer (HCC)    left  2009 and 2025   Diabetes mellitus without complication (HCC)    Dyspnea    mostly with exertion   GERD (gastroesophageal reflux disease)    History of radiation therapy    Left  Lung-08/09/23-10/01/23- Dr. 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  Now appearing less bloody and serosanguineous in nature Total drainage 2200 cc  Objective    Blood pressure (!) 122/48, pulse 95, temperature 98.3 F (36.8 C), temperature source Oral, resp. rate 18, SpO2 100%.        Intake/Output Summary (Last 24 hours) at 12/17/2023 0901 Last data filed at 12/17/2023 0602 Gross per 24 hour  Intake 937.84 ml  Output 270 ml  Net 667.84 ml   There were no vitals filed for this visit.  Examination: Chronically ill-appearing, frail Awake, does not answer questions Lungs are clear to auscultation Heart rate regular rate and rhythm  Pleural fluid 1800 nucleated cells, 82% neutrophil Microbiology negative.   CT chest on 12/16/2023 both small left pneumothorax, bibasal atelectasis, pneumonia  Resolved problem list   Assessment and Plan   Left pleural effusion LLL atelectasis vs PNA vs underlying scaring LLL and bronchus adenocarcinoma stage IIIa s/p radiation Hx breast Cancer  Acute hypoxic respiratory failure ILD, hx Lupus  Dysphagia  DNR/ DNI Small left pleural effusion noted back on CXR 11/19/23 Effusion may  be parapneumonic versus malignant.  CT reviewed with basal infiltrates and size of underlying mass is not clear. P:  - Neutrophil predominant.  Follow protein, glucose and LDH and cytology - Will send the pleural VAC container down for second cytology test. - cont CT to 20 sxn, flush q8hrs - follow CT output closely, ideally consider removal with 24hr output < 150 ml - Urine strep and legionella ordered. - Cont ceftriaxone /  doxycycline  - Hold anticoagulation as chest tube output is serosanguineous - albuterol  PRN  - wean supplemental O2 for sat goal > 90% - cont to hold plaquenil / prednisone  - pulmonary will continue to follow   Remainder per primary team  Encephalopathy s/p MRI, EEG HTN HFpEF- does not appear to be exacerbation PAF/ flutter Anemia  Signature:   Alizzon Dioguardi MD [facility] See Amion for pager  If no response to pager , please call [phone] until 7pm After 7:00 pm call Elink  [phone] 12/17/2023, 9:24 AM

## 2023-12-17 NOTE — TOC Initial Note (Signed)
 Transition of Care [facility]) - Initial/Assessment Note    Patient Details  Name: [name] MRN: [id number] Date of Birth: 1940-11-26  Transition of Care [facility]) CM/SW Contact:    Tawni CHRISTELLA Eva, LCSW Phone Number: 12/17/2023, 3:17 PM  Clinical Narrative:                  Pt a SNF resident from [facility]. CSW spoke with Kia pt can return , if plan is to return will need new insurance authorization. ICM to follow.    Barriers to Discharge: Continued Medical Work up   Patient Goals and CMS Choice            Expected Discharge Plan and Services                                              Prior Living Arrangements/Services                       Activities of Daily Living      Permission Sought/Granted                  Emotional Assessment              Admission diagnosis:  Pleural effusion [J90] Malignant neoplasm of left lung, unspecified part of lung (HCC) [C34.92] Altered mental status, unspecified altered mental status type [R41.82] Patient Active Problem List   Diagnosis Date Noted   Acute metabolic encephalopathy 12/16/2023   Acute respiratory failure with hypoxia (HCC) 12/16/2023   History of stroke 12/16/2023   Prolonged QT interval 12/16/2023   DNR (do not resuscitate) 12/16/2023   Altered mental status 12/16/2023   Need for emotional support 12/16/2023   Counseling and coordination of care 12/16/2023   Goals of care, counseling/discussion 12/16/2023   Palliative care encounter 12/16/2023   Pleural effusion 12/15/2023   Protein-calorie malnutrition, severe 11/19/2023   Acute right hemiparesis (HCC) 11/06/2023   Normocytic anemia 11/06/2023   History of lung cancer 11/06/2023   Cerebellar stroke, acute (HCC) 11/06/2023   NSTEMI (non-ST elevated myocardial infarction) (HCC) 10/30/2023   Allergic rhinitis due to pollen 10/19/2023   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   Abnormal gait 10/19/2023    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   Bloating 10/19/2023   Centrilobular emphysema (HCC) 10/19/2023   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   Diabetic mononeuropathy associated with type 2 diabetes mellitus (HCC) 10/19/2023   Family history of colon cancer in father 10/19/2023   Flatulence, eructation and gas pain 10/19/2023   Gastroesophageal reflux disease 10/19/2023   History of colonic polyps 10/19/2023   High glucose level 10/19/2023   Headache disorder 10/19/2023   Hyperglycemia due to type 2 diabetes mellitus (HCC) 10/19/2023   History of endocarditis in adulthood 10/19/2023   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/19/2023   Iron  deficiency anemia 10/19/2023   Parietoalveolar pneumopathy (HCC) 10/19/2023   Osteoarthritis 10/19/2023   Neuropathy 10/19/2023   Personal history of malignant neoplasm of breast 10/19/2023   Myopathy, unspecified 10/19/2023   Pure hypercholesterolemia 10/19/2023   Stage 3a chronic kidney disease (HCC) 10/19/2023   Traumatic plantar fasciitis 10/19/2023   Atherosclerosis of coronary artery 10/19/2023   CAD S/P percutaneous coronary angioplasty 10/19/2023   Chronic systolic heart failure (HCC) 10/19/2023   Type 2 diabetes mellitus with other specified complication (HCC)  10/19/2023   Adverse effect of antihyperlipidemic and antiarteriosclerotic drugs, initial encounter 10/19/2023   Adverse effect of antihyperlipidemic drug 10/19/2023   Family history of malignant neoplasm of gastrointestinal tract 10/19/2023   Shortness of breath 10/19/2023   Encounter for screening for malignant neoplasm of colon 10/19/2023   Decreased estrogen level 10/19/2023   Elevated TSH 10/19/2023   Acquired hypothyroidism 10/19/2023   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   Systemic lupus erythematosus (HCC) 10/19/2023   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) 10/19/2023   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) 10/19/2023   Malignant neoplasm of lung (HCC) 10/19/2023   Essential hypertension 10/19/2023   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) 07/22/2023   Mediastinal adenopathy 06/29/2023   Lung mass 06/20/2023   Pulmonary embolism (HCC) 06/19/2023   S/P left mastectomy 03/31/2023   Genetic testing 03/10/2023   Family history of malignant neoplasm of breast 02/26/2023   Family history of malignant neoplasm of prostate 02/26/2023   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) 02/18/2023   Long term (current) use of anticoagulants 10/13/2022   Sepsis due to pneumonia (HCC) 10/11/2022   AKI (acute kidney injury) 10/11/2022   Paroxysmal atrial flutter (HCC) 10/11/2022   Diarrhea 10/11/2022   Controlled type 2 diabetes mellitus without complication, without long-term current use of insulin  (HCC) 10/11/2022   Mixed hyperlipidemia 10/11/2022   Atherosclerosis of aorta 10/11/2022   Diabetes mellitus type 2, noninsulin dependent (HCC) 10/11/2022   Benign hypertension 10/11/2022   Acute febrile illness 10/11/2022   Atrial flutter with rapid ventricular response (HCC) 10/11/2022   Chronic diastolic CHF (congestive heart failure) (HCC) 10/11/2022   Syncope 04/25/2022   Near syncope 02/18/2019   Hyponatremia 02/18/2019   ILD (interstitial lung disease) (HCC) 07/30/2016   Dyspnea 07/16/2016   Bibasilar crackles 07/16/2016   History of systemic lupus erythematosus (SLE) (HCC) 07/16/2016   Cough 07/16/2016   Encounter for monitoring ACE-inhibitor therapy 07/16/2016   History of asthma 07/16/2016   Hypothyroidism 01/03/2015   Primary hypertension 01/03/2015   UTI (urinary tract infection) 01/03/2015   Drug-induced hypersensitivity reaction 01/03/2015   Lupus 01/03/2015   Pyrexia 01/02/2015   PCP:  [name], MD Pharmacy:  No Pharmacies Listed    Social Drivers of Health (SDOH) Social History: SDOH Screenings   Food Insecurity: Patient Unable To  Answer (12/17/2023)  Recent Concern: Food Insecurity - Food Insecurity Present (11/09/2023)  Housing: Patient Unable To Answer (12/17/2023)  Recent Concern: Housing - High Risk (11/09/2023)  Transportation Needs: Patient Unable To Answer (12/17/2023)  Utilities: Patient Unable To Answer (12/17/2023)  Depression (PHQ2-9): Low Risk  (08/20/2023)  Social Connections: Unknown (12/17/2023)  Tobacco Use: Unknown (12/15/2023)  Recent Concern: Tobacco Use - High Risk (09/28/2023)   SDOH Interventions:     Readmission Risk Interventions     No data to display

## 2023-12-17 NOTE — Progress Notes (Signed)
 Daily Progress Note   Patient Name: [name]       Date: 12/17/2023 DOB: 1940/06/10  Age: 83 y.o. MRN#: [id number] Attending Physician: Rosario Leatrice FERNS, MD Primary Care Physician: [name], MD Admit Date: 12/15/2023 Length of Stay: 2 days  Reason for Consultation/Follow-up: Establishing goals of care  Subjective:   Reviewed EMR including recent documentation from hospitalist, oncologist, SLP, and PCCM. Oncologist discussed with patient's daughter and sister that patient would not be a candidate for further cancer directed therapies due to her functional status. Family had stated to oncologist that they wanted to see if patient made any progress over the next couple of days to determine how to proceed. Should patient not do well, family was willing to then revisit goals of care discussions. PCCM sent pleural VAC contained down for second cytology test today after seeing the patient. CT chest imaging noting concern for PNA vs underlying mass. Patient continues to appropriately receive antibiotics to continue appropraite management related to concerns for possible infection.   Presented to bedside to see patient in the afternoon. No visitors were present at bedside. Patient was laying comfortably in bed though awakened by opening eyes when attempting to interact with her. Though patient did not speak, she was noted to be more awake and attempting to interaction than yesterday.  PMT continuing to follow along with patient's medical journey.   Objective:   Vital Signs:  BP (!) 123/47 (BP Location: Left Arm)   Pulse 96   Temp 97.6 F (36.4 C) (Oral)   Resp 19   SpO2 100%   Physical Exam: General: easily awakened to voice, chronically ill appearing, frail, cachectic  Cardiovascular: RRR Respiratory: no increased work of breathing noted, not in respiratory distress, on [place] 5L/min Abdomen: not distended Neuro: easily awakens to voice by opening eyes and moving head, did not  speak    Assessment & Plan:   Assessment: Patient is an 83 year old female with a past medical history of CVA with residual right-sided hemiplegia, history of breast cancer status postmastectomy, HFpEF, CAD, NSTEMI, SIADH, 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  Upon admission, patient initially required support with BiPAP and imaging obtained noted large left pleural effusion which required chest tube placement.  PCCM and oncology consulted for recommendations.  Since admission patient has continued to receive workup for acute respiratory failure secondary to pleural effusion and altered mental status.  Palliative medicine team consulted to assist with complex medical decision making.   Recommendations/Plan: # Complex medical decision making/goals of care:        - Patient unable to participate in complex medical decision making due to underlying medical status.                - Patient currently continuing to receive appropraite medical interventions to determine if her mental will improve. Allowing time for outcomes related to this. PMT will continue to engage in conversations as able and appropriate moving forward.                 -  Code Status: Limited: Do not attempt resuscitation (DNR) -DNR-LIMITED -Do Not Intubate/DNI                                 -  Patient appropriately DNR/DNI.  Has DNR form on file and ACP documentation from 12/02/2023.   # Psycho-social/Spiritual Support:  - Support System: Daughter, son, granddaughter   # Discharge Planning:  To Be Determined  Thank you for allowing the palliative care team to participate in the care [name].  Tinnie Radar, DO Palliative Care Provider PMT # [phone]  If patient remains symptomatic despite maximum doses, please call PMT at [phone] between 0700 and 1900. Outside of these hours, please call attending, as PMT does not have night coverage.

## 2023-12-17 NOTE — Plan of Care (Signed)
  Problem: Clinical Measurements: Goal: Ability to maintain clinical measurements within normal limits will improve Outcome: Progressing   Problem: Clinical Measurements: Goal: Will remain free from infection Outcome: Progressing   Problem: Pain Managment: Goal: General experience of comfort will improve and/or be controlled Outcome: Progressing   Problem: Safety: Goal: Ability to remain free from injury will improve Outcome: Progressing

## 2023-12-17 NOTE — Plan of Care (Signed)
  Problem: Clinical Measurements: Goal: Respiratory complications will improve Outcome: Progressing   Problem: Coping: Goal: Level of anxiety will decrease Outcome: Progressing   Problem: Pain Managment: Goal: General experience of comfort will improve and/or be controlled Outcome: Progressing   Problem: Skin Integrity: Goal: Risk for impaired skin integrity will decrease Outcome: Progressing   Problem: Respiratory: Goal: Ability to maintain adequate ventilation will improve Outcome: Progressing Goal: Ability to maintain a clear airway will improve Outcome: Progressing   Problem: Education: Goal: Knowledge of General Education information will improve Description: Including pain rating scale, medication(s)/side effects and non-pharmacologic comfort measures Outcome: Not Progressing   Problem: Health Behavior/Discharge Planning: Goal: Ability to manage health-related needs will improve Outcome: Not Progressing   Problem: Clinical Measurements: Goal: Ability to maintain clinical measurements within normal limits will improve Outcome: Not Progressing Goal: Will remain free from infection Outcome: Not Progressing Goal: Diagnostic test results will improve Outcome: Not Progressing Goal: Cardiovascular complication will be avoided Outcome: Not Progressing   Problem: Activity: Goal: Risk for activity intolerance will decrease Outcome: Not Progressing   Problem: Nutrition: Goal: Adequate nutrition will be maintained Outcome: Not Progressing   Problem: Elimination: Goal: Will not experience complications related to bowel motility Outcome: Not Progressing Goal: Will not experience complications related to urinary retention Outcome: Not Progressing   Problem: Safety: Goal: Ability to remain free from injury will improve Outcome: Not Progressing   Problem: Activity: Goal: Ability to tolerate increased activity will improve Outcome: Not Progressing   Problem:  Clinical Measurements: Goal: Ability to maintain a body temperature in the normal range will improve Outcome: Not Progressing

## 2023-12-17 NOTE — Progress Notes (Signed)
 Progress Note   Patient: [name] FMW:[id number] DOB: 28-Aug-1940 DOA: 12/15/2023     2 DOS: the patient was seen and examined on 12/16/2023   Brief hospital course: The patient is a 83 yr old woman who presented from SNF for decreased level of responsiveness and altered mental status.  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 In the ED she was initially in respiratory distress. She was found to have a large left pleural effusion. Pulmonology was consulted and chest tube was placed.  At the time of exam the patient remains non-verbal, but awake. Her baseline is to be awake alert and conversant. Son is at bedside.  CT head ws negative for acute infarcts. MRI is pending.  12/17/2023: Patient seen alongside patient's daughter.  No new changes.  MRI brain is nonrevealing.  CO2 is noted to be 15.  VBG done yesterday revealed pH of 7.29, pCO2 of 41 and O2 of 38.  Anion gap is normal.  No diarrhea.  Assessment and Plan: Altered mental status: - History of prior CVA, lupus, and stage IIIb lung cancer with CT brain that was negative for acute infarct.  12/17/2023: MRI brain has not shown any acute findings.  Likely metabolic acidosis noted, CO2 of 15.  Etiology uncertain.  Acute hypoxic respiratory failure  with large pleural effusion: - Chest tube placed by pulmonology in the ED. 2L removed.Pulmonology following and fluid sent for studies. -Pleural fluid 1800 nucleated cells, 82% neutrophil.  Microbiology negative to date.  LDH of 1120.  Total protein of 4.2 (serum protein of 5.9)  Likely metabolic acidosis: - See above documentation) - CO2 of 15. - pH of 7.29. - Normal anion gap. - No history of diarrhea. -No history of liver disease i.e. liver cirrhosis.  However, AST was 53 and ALT 28 (AST/ALT ratio is close to 2).  No imaging of the liver visualized.  Platelet count of  42-72 noted (will check HIT antibody). - Start sodium bicarbonate  1300 mg p.o. twice daily.  Thrombocytopenia: - See above documentation -Check HIT antibody. - Platelet count of 42-72. - No bleeding noted. - Low threshold to consider liver ultrasound.  DM II: Follow glucoses with FSBS and SSI.  Hypertension: Blood pressures are currently normotensive   Subjective:  - No new complaints. - Minimal history from patient due to recent acute CVA with right-sided hemiplegia.  Physical Exam: Vitals:   12/17/23 1200 12/17/23 1201 12/17/23 1446 12/17/23 1500  BP:  (!) 116/49    Pulse:  93    Resp: 19 17  20   Temp:  98.1 F (36.7 C)    TempSrc:  Oral    SpO2:  100%    Weight:   56.4 kg    Exam:  Constitutional:  The patient is awake, alert.  Patient verbalizes, however, minimal, due to recent acute CVA.  Dysarthria is noted.  Right-sided hemiplegia. Respiratory:  Clear to auscultation. Cardiovascular:  S1-S2.SABRA Abdomen:  Abdomen is soft, non-tender.  Neurologic:  Right-sided hemiaplasia, with speech impairment.   Data Reviewed:  CBC, BMP, CT chest  Family Communication: Son at bedside  Disposition: Status is: Inpatient Remains inpatient appropriate because: Medically complex patient requiring close monitoring, subspecialty support, and MRI.  Planned Discharge Destination: Skilled nursing facility    Time spent: 35 minutes  Author: Leatrice LILLETTE Chapel, MD 12/16/2023 6:34 PM  For on call review [email or web address].

## 2023-12-17 NOTE — Progress Notes (Signed)
   12/17/23 0900  SLP Visit Information  SLP Received On 12/17/23  Reason Eval/Treat Not Completed Fatigue/lethargy limiting ability to participate (pt lethargic at this time; she did not awaken adequately for po trials; please refer to prior evalaution from evening prior)   Tammy K, MS [facility] SLP Acute Rehab Services Office [phone]

## 2023-12-18 ENCOUNTER — Inpatient Hospital Stay (HOSPITAL_COMMUNITY)

## 2023-12-18 DIAGNOSIS — J90 Pleural effusion, not elsewhere classified: Secondary | ICD-10-CM | POA: Diagnosis not present

## 2023-12-18 DIAGNOSIS — J189 Pneumonia, unspecified organism: Secondary | ICD-10-CM | POA: Diagnosis not present

## 2023-12-18 DIAGNOSIS — Z66 Do not resuscitate: Secondary | ICD-10-CM | POA: Diagnosis not present

## 2023-12-18 DIAGNOSIS — J939 Pneumothorax, unspecified: Secondary | ICD-10-CM

## 2023-12-18 DIAGNOSIS — Z515 Encounter for palliative care: Secondary | ICD-10-CM | POA: Diagnosis not present

## 2023-12-18 DIAGNOSIS — Z7189 Other specified counseling: Secondary | ICD-10-CM | POA: Diagnosis not present

## 2023-12-18 DIAGNOSIS — J849 Interstitial pulmonary disease, unspecified: Secondary | ICD-10-CM | POA: Diagnosis not present

## 2023-12-18 LAB — RENAL FUNCTION PANEL
Albumin: 2.9 g/dL — ABNORMAL LOW (ref 3.5–5.0)
Anion gap: 14 (ref 5–15)
BUN: 23 mg/dL (ref 8–23)
CO2: 16 mmol/L — ABNORMAL LOW (ref 22–32)
Calcium: 9.5 mg/dL (ref 8.9–10.3)
Chloride: 114 mmol/L — ABNORMAL HIGH (ref 98–111)
Creatinine, Ser: 0.76 mg/dL (ref 0.44–1.00)
GFR, Estimated: >60 mL/min (ref >60)
Glucose, Bld: 131 mg/dL — ABNORMAL HIGH (ref 70–99)
Phosphorus: 2.1 mg/dL — ABNORMAL LOW (ref 2.5–4.6)
Potassium: 3.5 mmol/L (ref 3.5–5.1)
Sodium: 143 mmol/L (ref 135–145)

## 2023-12-18 LAB — CBC WITH DIFFERENTIAL/PLATELET
Abs Immature Granulocytes: 0.09 K/uL — ABNORMAL HIGH (ref 0.00–0.07)
Basophils Absolute: 0 K/uL (ref 0.0–0.1)
Basophils Relative: 0 %
Eosinophils Absolute: 0 K/uL (ref 0.0–0.5)
Eosinophils Relative: 0 %
HCT: 30.5 % — ABNORMAL LOW (ref 36.0–46.0)
Hemoglobin: 9.4 g/dL — ABNORMAL LOW (ref 12.0–15.0)
Immature Granulocytes: 1 %
Lymphocytes Relative: 18 %
Lymphs Abs: 1.6 K/uL (ref 0.7–4.0)
MCH: 30.8 pg (ref 26.0–34.0)
MCHC: 30.8 g/dL (ref 30.0–36.0)
MCV: 100 fL (ref 80.0–100.0)
Monocytes Absolute: 0.5 K/uL (ref 0.1–1.0)
Monocytes Relative: 6 %
Neutro Abs: 7.1 K/uL (ref 1.7–7.7)
Neutrophils Relative %: 75 %
Platelets: 36 K/uL — ABNORMAL LOW (ref 150–400)
RBC: 3.05 MIL/uL — ABNORMAL LOW (ref 3.87–5.11)
RDW: 18.5 % — ABNORMAL HIGH (ref 11.5–15.5)
WBC: 9.3 K/uL (ref 4.0–10.5)
nRBC: 0 % (ref 0.0–0.2)

## 2023-12-18 LAB — GLUCOSE, CAPILLARY
Glucose-Capillary: 136 mg/dL — ABNORMAL HIGH (ref 70–99)
Glucose-Capillary: 127 mg/dL — ABNORMAL HIGH (ref 70–99)
Glucose-Capillary: 128 mg/dL — ABNORMAL HIGH (ref 70–99)
Glucose-Capillary: 136 mg/dL — ABNORMAL HIGH (ref 70–99)
Glucose-Capillary: 125 mg/dL — ABNORMAL HIGH (ref 70–99)

## 2023-12-18 LAB — STREP PNEUMONIAE URINARY ANTIGEN: Strep Pneumo Urinary Antigen: NEGATIVE

## 2023-12-18 LAB — MAGNESIUM: Magnesium: 2.1 mg/dL (ref 1.7–2.4)

## 2023-12-18 MED ORDER — POLYVINYL ALCOHOL 1.4 % OP SOLN
1.0000 [drp] | OPHTHALMIC | Status: DC | PRN
Start: 2023-12-18 — End: 2023-12-21
  Administered 2023-12-18: 1 [drp] via OPHTHALMIC
  Filled 2023-12-18: qty 15

## 2023-12-18 NOTE — Progress Notes (Signed)
 NAME:  [name], MRN:  [id number], DOB:  1940-11-12, LOS: 3 ADMISSION DATE:  12/15/2023, CONSULTATION DATE:  @TODAY @ REFERRING MD:  Dr. Rosario, CHIEF COMPLAINT:  Left pleural effusion   History of Present Illness:  47 yoF with PMH as below significant for stage IIIa lung adenocarcinoma undergoing radiation therapy, emphysema, ILD with UIP pattern, SLE on plaquenil  and prednisone , HTN, afib on Eliquis , hx breast cancer s/p mastectomy, DMT2, hypothyroidism, CVA with right sided hemiplegia, HTN, HFpEF, CAD, NSTEMI, hyponatremia/ SIADH, and chronic anemia.  Recent admit 10/14- 11/13 with acute CVA discharged to CIR then to SNF on 11/13.  Recently having esophagitis after radiation with some difficulty in eating and drinking.  Presented from SNF with 3 days of AMS, some cough but no known fever.  In ER, afebrile, WBC 9.6, H/H 8.6/ 27, plts 67 (previously 58), lactic 1.8, PCT 0.12, UA neg.  CTH neg.  CXR showed large left pleural effusion and right base opacities vs atelectasis.  Developed respiratory distress in ER, placed on BiPAP and underwent CT placement in ER with 2L drained and sent for pleural studies, culture and cytology.  Started empirically on ceftriaxone  and doxycycline . Admitted to [facility].  Pt is DNR/ DNI.  Pulmonary consulted for further input with left pleural effusion and CT management.    Pt remains encephalopathic.  Son at bedside but verbalizes he lives out of town and his he is unsure of specific details, states pt was living with his sister prior to hospitalizations and she would know more.      Pertinent  Medical History   Past Medical History:  Diagnosis Date   Acute bronchitis 12/17/2021   Acute cystitis 02/20/2019   Arthritis    Atrial flutter (HCC)    Breast cancer (HCC)    left  2009 and 2025   Diabetes mellitus without complication (HCC)    Dyspnea    mostly with exertion   GERD (gastroesophageal reflux disease)    History of radiation therapy    Left  Lung-08/09/23-10/01/23- Dr. 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 Drained 385 cc from chest tube. Pleural fluid bloody.  Objective    Blood pressure (!) 140/58, pulse (!) 104, temperature 98.3 F (36.8 C), temperature source Oral, resp. rate 18, height 5' 6 (1.676 m), weight 57.2 kg, SpO2 96%.        Intake/Output Summary (Last 24 hours) at 12/18/2023 1800 Last data filed at 12/18/2023 1334 Gross per 24 hour  Intake 746.73 ml  Output 240 ml  Net 506.73 ml   Filed Weights   12/17/23 1446 12/18/23 0401  Weight: 56.4 kg 57.2 kg  CT OP overnight is 435 mL  Examination: General: somnolent, chronically ill appearing HENT: pale conjunctivae Lungs: clear anteriorly, serosanguinous drainage from CT, no air leak, CT on - 20 suction Cardiovascular: distant heart sounds  Resolved problem list   Assessment and Plan   #Left Secondary Spontaneous Pneumothorax: #Community Acquired Pneumonia: #Parapneumonic effusion: LDH 1120 indicating complicated exudative pleural effusion. WBC 1800, 82% PMNs. This suggests a parapneumonic effusion. MRSA swab negative. Cultures no growth to date. Cytology in process. - FU cytology results -  Daily CXR - Keep CT on - 20 suction for now - Will keep CT in place given significant output as noted above - Will consider removing CT when output is < 150 mL - Agree with antimicrobials per primary medical team  Labs   CBC: Recent Labs  Lab 12/15/23 1702 12/15/23 1713 12/16/23 1933 12/16/23 2138 12/17/23 0409 12/17/23 1814 12/18/23 0413  WBC 9.6  --  9.3 9.4 9.2 7.9 9.3  NEUTROABS 7.0  --   --  7.0  --  5.9 7.1  HGB  8.6*   < > 7.5* 7.3* 6.4* 8.8* 9.4*  HCT 27.5*   < > 26.2* 23.7* 20.1* 28.8* 30.5*  MCV 96.5  --  104.4* 98.8 96.6 99.3 100.0  PLT 67*  --  27* 32* 72* 42* 36*   < > = values in this interval not displayed.    Basic Metabolic Panel: Recent Labs  Lab 12/15/23 1702 12/15/23 1713 12/16/23 1933 12/16/23 2138 12/17/23 0409 12/18/23 0413  NA 137 139 140 140 141 143  K 3.5 3.5 4.0 3.9 3.6 3.5  CL 107 109 112* 112* 112* 114*  CO2 17*  --  14* 15* 15* 16*  GLUCOSE 95 94 121* 124* 120* 131*  BUN 15 14 18 19 20 23   CREATININE 0.72 0.70 0.69 0.72 0.74 0.76  CALCIUM  9.1  --  8.6* 8.8* 8.6* 9.5  MG 2.0  --  2.0  --   --  2.1  PHOS 2.7  --  2.6  --   --  2.1*   GFR: Estimated Creatinine Clearance: 49.8 mL/min (by C-G formula based on SCr of 0.76 mg/dL). Recent Labs  Lab 12/15/23 1702 12/15/23 1713 12/16/23 1933 12/16/23 2138 12/17/23 0409 12/17/23 1814 12/18/23 0413  PROCALCITON 0.12  --  0.57  --   --   --   --   WBC 9.6  --  9.3 9.4 9.2 7.9 9.3  LATICACIDVEN  --  1.8  --   --   --   --   --     Liver Function Tests: Recent Labs  Lab 12/15/23 1702 12/16/23 1933 12/18/23 0413  AST 47* 53*  --   ALT 32 28  --   ALKPHOS 84 71  --   BILITOT 1.0 0.6  --   PROT 6.9 5.9*  --   ALBUMIN 3.3* 2.8* 2.9*   No results for input(s): LIPASE, AMYLASE in the last 168 hours. Recent Labs  Lab 12/15/23 1702  AMMONIA 15    ABG    Component Value Date/Time   HCO3 19.7 (L) 12/16/2023 0415   TCO2 17 (L) 12/15/2023 1713   ACIDBASEDEF 6.5 (H) 12/16/2023 0415   O2SAT 57.6 12/16/2023 0415     Coagulation Profile: No results for input(s): INR, PROTIME in the last 168 hours.  Cardiac Enzymes: Recent Labs  Lab 12/15/23 1702  CKTOTAL 152    HbA1C: Hgb A1c MFr Bld  Date/Time Value Ref Range Status  11/06/2023 02:33 PM 6.0 (H) 4.8 - 5.6 % Final    Comment:    (NOTE) Diagnosis of Diabetes The following HbA1c ranges recommended by the American Diabetes Association  (ADA) may be used as an aid in the diagnosis of diabetes mellitus.  Hemoglobin             Suggested A1C NGSP%              Diagnosis  <5.7  Non Diabetic  5.7-6.4                Pre-Diabetic  >6.4                   Diabetic  <7.0                   Glycemic control for                       adults with diabetes.    03/26/2023 01:45 PM 6.8 (H) 4.8 - 5.6 % Final    Comment:    (NOTE) Pre diabetes:          5.7%-6.4%  Diabetes:              >6.4%  Glycemic control for   <7.0% adults with diabetes     CBG: Recent Labs  Lab 12/17/23 2024 12/18/23 0247 12/18/23 0813 12/18/23 1207 12/18/23 1639  GLUCAP 113* 136* 127* 128* 136*    Review of Systems:   Not obtained  Past Medical History:  She,  has a past medical history of Acute bronchitis (12/17/2021), Acute cystitis (02/20/2019), Arthritis, Atrial flutter (HCC), Breast cancer (HCC), Diabetes mellitus without complication (HCC), Dyspnea, GERD (gastroesophageal reflux disease), History of radiation therapy, Hypertension, Hypothyroidism, Libman Sacks endocarditis (HCC), Lupus, and Pneumonia (07/31/2021).   Surgical History:   Past Surgical History:  Procedure Laterality Date   BREAST BIOPSY Left 01/15/2023   US  LT BREAST BX W LOC DEV 1ST LESION IMG BX SPEC US  GUIDE 01/15/2023 [facility] MAMMOGRAPHY   BREAST LUMPECTOMY Left 2009   BRONCHIAL BIOPSY  06/29/2023   Procedure: BRONCHOSCOPY, WITH BIOPSY;  Surgeon: [name], MD;  Location: [facility] ENDOSCOPY;  Service: Pulmonary;;   BRONCHIAL BRUSHINGS  06/29/2023   Procedure: BRONCHOSCOPY, WITH BRUSH BIOPSY;  Surgeon: [name], MD;  Location: [facility] ENDOSCOPY;  Service: Pulmonary;;   BRONCHIAL NEEDLE ASPIRATION BIOPSY  06/29/2023   Procedure: BRONCHOSCOPY, WITH NEEDLE ASPIRATION BIOPSY;  Surgeon: [name], MD;  Location: [facility] ENDOSCOPY;  Service: Pulmonary;;   ESOPHAGOGASTRODUODENOSCOPY N/A 12/07/2023   Procedure: EGD (ESOPHAGOGASTRODUODENOSCOPY);  Surgeon:  Saintclair Jasper, MD;  Location: [facility] ENDOSCOPY;  Service: Gastroenterology;  Laterality: N/A;   IR 3D INDEPENDENT WKST  05/29/2021   IR ANGIO INTRA EXTRACRAN SEL COM CAROTID INNOMINATE BILAT MOD SED  05/29/2021   IR ANGIO VERTEBRAL SEL VERTEBRAL BILAT MOD SED  05/29/2021   LEFT HEART CATH AND CORONARY ANGIOGRAPHY N/A 11/01/2023   Procedure: LEFT HEART CATH AND CORONARY ANGIOGRAPHY;  Surgeon: Wonda Sharper, MD;  Location: [facility] INVASIVE CV LAB;  Service: Cardiovascular;  Laterality: N/A;   SIMPLE MASTECTOMY WITH AXILLARY SENTINEL NODE BIOPSY Left 03/31/2023   Procedure: LEFT MASTECTOMY;  Surgeon: Vernetta Berg, MD;  Location[facility] OR;  Service: General;  Laterality: Left;  LMA PEC BLOCK   VIDEO BRONCHOSCOPY WITH ENDOBRONCHIAL NAVIGATION Left 06/29/2023   Procedure: VIDEO BRONCHOSCOPY WITH ENDOBRONCHIAL NAVIGATION;  Surgeon: [name], MD;  Location: [facility] ENDOSCOPY;  Service: Pulmonary;  Laterality: Left;   VIDEO BRONCHOSCOPY WITH ENDOBRONCHIAL ULTRASOUND Bilateral 06/29/2023   Procedure: BRONCHOSCOPY, WITH EBUS;  Surgeon: [name], MD;  Location: [facility] ENDOSCOPY;  Service: Pulmonary;  Laterality: Bilateral;     Social History:   reports that she has never smoked. She has never been exposed to tobacco smoke. She does not have any smokeless tobacco history on file. She reports that she does not drink alcohol  and does not use drugs.  Family History:  Her family history includes Asthma in her brother; Breast cancer (age of onset: 74) in her daughter; Cancer in her daughter and father; Prostate cancer (age of onset: 32 - 65) in her father.   Allergies Allergies  Allergen Reactions   Ciprofloxacin Itching, Swelling and Other (See Comments)    Possibly causing tremors?   Crestor  [Rosuvastatin ] Other (See Comments)    Myalgia and back pain   Lisinopril  Cough   Ofev  [Nintedanib] Nausea Only     Home Medications  Prior to Admission medications   Medication Sig Start Date End Date Taking?  Authorizing Provider  acetaminophen  (TYLENOL ) 325 MG tablet Take 1-2 tablets (325-650 mg total) by mouth every 4 (four) hours as needed for mild pain (pain score 1-3). Patient taking differently: Take 650 mg by mouth every 4 (four) hours as needed for mild pain (pain score 1-3). 12/08/23  Yes Love, Sharlet RAMAN, PA-C  albuterol  (VENTOLIN  HFA) 108 (90 Base) MCG/ACT inhaler Inhale 2 puffs into the lungs every 6 (six) hours as needed for wheezing or shortness of breath. 07/09/23  Yes Ruthell Lauraine FALCON, NP  anastrozole  (ARIMIDEX ) 1 MG tablet Take 1 tablet (1 mg total) by mouth daily. 04/08/23  Yes [name], [name], MD  aspirin  EC 81 MG tablet Take 1 tablet (81 mg total) by mouth daily. Swallow whole. 11/05/23  Yes Ladona Heinz, MD  atorvastatin  (LIPITOR) 40 MG tablet Take 40 mg by mouth daily at 6 PM.   Yes [provider]  Baclofen  5 MG TABS Take 1 tablet (5 mg total) by mouth 2 (two) times daily as needed for muscle spasms. 12/08/23  Yes Love, Sharlet RAMAN, PA-C  diclofenac  Sodium (VOLTAREN ) 1 % GEL Apply 2 g topically 4 (four) times daily. To left shoulder Patient taking differently: Apply 2 g topically See admin instructions. Apply 2 grams to the left shoulder every 6 hours 12/08/23  Yes Love, Sharlet RAMAN, PA-C  diltiazem  (CARDIZEM  CD) 120 MG 24 hr capsule Take 1 capsule (120 mg total) by mouth daily. 01/07/23  Yes Dunn, Dayna N, PA-C  escitalopram  (LEXAPRO ) 10 MG tablet Take 1 tablet (10 mg total) by mouth at bedtime. 12/08/23  Yes Love, Sharlet RAMAN, PA-C  ferrous gluconate  (FERGON) 324 MG tablet Take 1 tablet (324 mg total) by mouth daily with breakfast. 12/09/23  Yes Jerilynn Daphne SAILOR, NP  gabapentin  (NEURONTIN ) 100 MG capsule Take 1 capsule (100 mg total) by mouth at bedtime. 12/08/23  Yes Love, Sharlet RAMAN, PA-C  hydroxychloroquine  (PLAQUENIL ) 200 MG tablet Take 200 mg by mouth daily.   Yes [provider]  levothyroxine  (SYNTHROID ) 100 MCG tablet Take 100 mcg by mouth daily before breakfast. 02/05/21   Yes [provider]  lidocaine  (LIDODERM ) 5 % Place 1 patch onto the skin daily. Remove & Discard patch within 12 hours or as directed by MD Patient taking differently: Place 1-3 patches onto the skin See admin instructions. Apply 1-3 patches topically to both shoulders at 8 AM- Remove & Discard patch within 12 hours or as directed by MD 12/08/23  Yes Love, Sharlet RAMAN, PA-C  magnesium  hydroxide (MILK OF MAGNESIA) 400 MG/5ML suspension Take 15 mLs by mouth daily as needed for moderate constipation. Patient taking differently: Take 15 mLs by mouth daily as needed (for constipation). 12/08/23  Yes Love, Sharlet RAMAN, PA-C  megestrol  (MEGACE ) 400 MG/10ML suspension Take 10 mLs (400 mg total) by mouth 2 (two) times daily. Patient taking differently: Take 400 mg by mouth in the  morning and at bedtime. 12/08/23  Yes Love, Sharlet RAMAN, PA-C  methylphenidate  (RITALIN ) 10 MG tablet Take 1 tablet (10 mg total) by mouth 2 (two) times daily with breakfast and lunch. 12/08/23  Yes Love, Sharlet RAMAN, PA-C  Multiple Vitamin (MULTIVITAMIN) tablet Take 1 tablet by mouth daily with breakfast.   Yes [provider]  nystatin  (MYCOSTATIN ) 100000 UNIT/ML suspension Use as directed 5 mLs (500,000 Units total) in the mouth or throat 4 (four) times daily. 12/08/23  Yes Love, Sharlet RAMAN, PA-C  OVER THE COUNTER MEDICATION Take 120 mLs by mouth See admin instructions. House supplement 2.0 - 120 ml's by mouth three times a day   Yes [provider]  pantoprazole  (PROTONIX ) 40 MG tablet Take 1 tablet (40 mg total) by mouth 2 (two) times daily before a meal. 12/08/23  Yes Love, Sharlet RAMAN, PA-C  polyethylene glycol (MIRALAX  / GLYCOLAX ) 17 g packet Take 17 g by mouth daily. 12/09/23  Yes Love, Sharlet RAMAN, PA-C  senna-docusate (SENOKOT-S) 8.6-50 MG tablet Take 1 tablet by mouth at bedtime as needed for mild constipation. 12/08/23  Yes Love, Sharlet RAMAN, PA-C  sodium chloride  (OCEAN) 0.65 % SOLN nasal spray Place 1 spray into  both nostrils as needed for congestion (nasal dryness). 12/08/23  Yes Jerilynn Daphne SAILOR, NP  sodium chloride  1 g tablet Take 1 tablet (1 g total) by mouth 2 (two) times daily with a meal. 12/08/23  Yes Love, Sharlet RAMAN, PA-C  sucralfate  (CARAFATE ) 1 g tablet Take 1 g by mouth 4 (four) times daily.   Yes [provider]  traMADol  (ULTRAM ) 50 MG tablet Take 1 tablet (50 mg total) by mouth every 6 (six) hours as needed for severe pain (pain score 7-10). 12/08/23  Yes Love, Sharlet RAMAN, PA-C  apixaban  (ELIQUIS ) 2.5 MG TABS tablet Take 1 tablet (2.5 mg total) by mouth 2 (two) times daily. 11/09/23   Darci Pore, MD  atorvastatin  (LIPITOR) 40 MG tablet Take 1 tablet (40 mg total) by mouth daily. Patient not taking: Reported on 12/15/2023 11/03/23 12/15/23  Cindy Garnette POUR, MD  feeding supplement (ENSURE PLUS HIGH PROTEIN) LIQD Take 237 mLs by mouth 2 (two) times daily between meals. Patient not taking: Reported on 12/15/2023 12/08/23   Love, Sharlet RAMAN, PA-C  leptospermum manuka honey (MEDIHONEY) PSTE paste Apply 1 Application topically daily. Patient not taking: Reported on 12/15/2023 12/08/23   Jerilynn Daphne SAILOR, NP  lip balm (CARMEX) ointment Apply topically as needed. Patient not taking: Reported on 12/15/2023 12/08/23   Maurice Sharlet RAMAN, PA-C     Thank you for this interesting consult. I have spent 35 minutes evaluating patient, reviewing chart, and discussing plan of care with patient, family, and primary medical team. If you have any questions or concerns please reach out to me via secure chat.  Paula Southerly, MD [facility]

## 2023-12-18 NOTE — Plan of Care (Signed)

## 2023-12-18 NOTE — Progress Notes (Signed)
 Patient's sister and daughter Annis) missed palliative consult at bedside Saturday and said they plan to be with patient Sunday morning (Sister has to fly back home Sunday afternoon). If daughter and sister are not at bedside, they have asked if provider can call [phone]  as they may be eating or temporarily out of the room.

## 2023-12-18 NOTE — Plan of Care (Signed)
  Problem: Education: Goal: Knowledge of General Education information will improve Description: Including pain rating scale, medication(s)/side effects and non-pharmacologic comfort measures Outcome: Progressing   Problem: Clinical Measurements: Goal: Ability to maintain clinical measurements within normal limits will improve Outcome: Progressing Goal: Will remain free from infection Outcome: Progressing Goal: Diagnostic test results will improve Outcome: Progressing Goal: Respiratory complications will improve Outcome: Progressing Goal: Cardiovascular complication will be avoided Outcome: Progressing   Problem: Coping: Goal: Level of anxiety will decrease Outcome: Progressing   Problem: Pain Managment: Goal: General experience of comfort will improve and/or be controlled Outcome: Progressing   Problem: Safety: Goal: Ability to remain free from injury will improve Outcome: Progressing   Problem: Skin Integrity: Goal: Risk for impaired skin integrity will decrease Outcome: Progressing   Problem: Health Behavior/Discharge Planning: Goal: Ability to manage health-related needs will improve Outcome: Not Progressing   Problem: Activity: Goal: Risk for activity intolerance will decrease Outcome: Not Progressing   Problem: Nutrition: Goal: Adequate nutrition will be maintained Outcome: Not Progressing   Problem: Elimination: Goal: Will not experience complications related to bowel motility Outcome: Not Progressing Goal: Will not experience complications related to urinary retention Outcome: Not Progressing   Problem: Activity: Goal: Ability to tolerate increased activity will improve Outcome: Not Progressing

## 2023-12-18 NOTE — Progress Notes (Signed)
 Daily Progress Note   Patient Name: [name]       Date: 12/18/2023 DOB: [birth year]  Age: 83 y.o. MRN#: [id number] Attending Physician: Rosario Leatrice FERNS, MD Primary Care Physician: [name], MD Admit Date: 12/15/2023 Length of Stay: 3 days  Reason for Consultation/Follow-up: Establishing goals of care  Subjective:   Reviewed EMR including recent documentation from hospitalist and SLP.  Reviewed recent BMP noting albumin low at 2.9.  Also reviewed CBC noting platelets low at 36.  Presented to bedside to see patient.  Patient laying in bed comfortably.  No visitors present at bedside.  Patient did again awaken to voice and attempted to verbalize to this provider.  Patient did shake her head yes and no minimally.  Have been allowing time for outcomes as per family's request.  Palliative medicine team to continue to follow along with patient's medical journey.  Discussed care with hospitalist to coordinate care.  Objective:   Vital Signs:  BP (!) 143/47 (BP Location: Right Arm)   Pulse 93   Temp 97.8 F (36.6 C)   Resp 16   Ht 5' 6 (1.676 m)   Wt 57.2 kg   SpO2 100%   BMI 20.35 kg/m   Physical Exam: General: easily awakened to voice, chronically ill appearing, frail, cachectic  Cardiovascular: RRR Respiratory: no increased work of breathing noted, not in respiratory distress, on [place] 5L/min Abdomen: not distended Neuro: easily awakens to voice by opening eyes and moving head, did not speak    Assessment & Plan:   Assessment: Patient is an 83 year old female with a past medical history of CVA with residual right-sided hemiplegia, history of breast cancer status postmastectomy, HFpEF, CAD, NSTEMI, SIADH, stage IIIb malignant neoplasm of bronchus of the lung undergoing radiation therapy, emphysema/ILD, atrial flutter, diabetes mellitus, GERD, hypertension, hypothyroidism, stage II sacral wound POA, and SLE on Plaquenil  and prednisone  who was admitted on 12/15/2023  from rehab at [facility] for management of decreased mental status and increased shortness of breath.  Upon admission, patient initially required support with BiPAP and imaging obtained noted large left pleural effusion which required chest tube placement.  PCCM and oncology consulted for recommendations.  Since admission patient has continued to receive workup for acute respiratory failure secondary to pleural effusion and altered mental status.  Palliative medicine team consulted to assist with complex medical decision making.   Recommendations/Plan: # Complex medical decision making/goals of care:        - Patient unable to participate in complex medical decision making due to underlying medical status.                - Patient currently continuing to receive appropraite medical interventions to determine if her mental will improve. Allowing time for outcomes related to this. PMT will continue to engage in conversations as able and appropriate moving forward.                 -  Code Status: Limited: Do not attempt resuscitation (DNR) -DNR-LIMITED -Do Not Intubate/DNI                                 - Patient appropriately DNR/DNI.  Has DNR form on file and ACP documentation from 12/02/2023.   # Psycho-social/Spiritual Support:  - Support System: Daughter, son, granddaughter   # Discharge Planning:  To Be Determined  Thank you for allowing the palliative care team  to participate in the care [name].  Tinnie Radar, DO Palliative Care Provider PMT # [phone]  If patient remains symptomatic despite maximum doses, please call PMT at [phone] between 0700 and 1900. Outside of these hours, please call attending, as PMT does not have night coverage.

## 2023-12-18 NOTE — Progress Notes (Signed)
 Speech Language Pathology Treatment: Dysphagia  Patient Details Name: [name] MRN: [id number] DOB: 05/30/1940 Today's Date: 12/18/2023 Time: 8766-8751 SLP Time Calculation (min) (ACUTE ONLY): 15 min  Assessment / Plan / Recommendation Clinical Impression  RN reports frequent fluctuations in level of alertness and poor command following this date. Her ongoing fatigue and poor mentation limited today's PO trials and dx assessment, but with brief periods of wakefulness and attention pt accepted a few ice chips and small sips of thin liquids. She actively accept ice chips x2 from spoon, demonstrating adequate labial seal. Poor awareness noted once accepted and pt required cueing to manipulate ice chip and trigger a swallow due to oral holding. Thin liquids via cup were spilled anteriorly (R side) and with suspected delayed swallow trigger, no overt s/sx of aspiration to follow. She sipped liquids via straw x1 successfully, but with immediate strong cough to follow. POs terminated due to ongoing fatigue and inattention.  PLAN: Pt's current mentation and level of alertness are highly impacting her ability to safely consume POs and have adequate nutrition. Recommend continue NPO with sips of thin liquid after oral care and when alert. SLP team will continue f/u for dx assessment.      HPI HPI: Per palliative MD note [name] is a 83 y.o. female with a history of  lung carcinoma Diagnosed 07/22/2023  - Status post radiation therapy from July to September 2025. Pt with subsequent radiation esophagitis and malnutrition.  -- Seen in [name] 10/29/23 and discussed per documentation that she is not a candidate for concurrent chemoradiation or immunotherapy alone based on TMB status.  Plan was to continue to follow-up with periodic imaging.  10/2023 pt suffered slurred speech and right-sided weakness with CT of the head showing large hypodensity on the right concerning for large territory infarct.  MRI of  the brain revealed multiple acute infarcts in bilateral watershed distribution involving the convexities, inferior right frontal lobe, right caudate head, posterior parietal and occipital lobes, right lateral pons and posterior cerebellum.   11/23/2023 MBS revealed functional swallow with recommendations for dys3/thin as pt had oral retention requiring liquids or extra time to clear.  She also did not swallow a tablet with liquids.  She has undergone prior EGD 11/11 conducted for anemia - and was normal except 2 cm hiatal hernia and pt was found to have blood in her oral cavity from epistaxis.  She has been at [name] and is essentially bed/chair bound with increased cough/congestion recently. Required a chest tube placement - CT chest  Small left pneumothorax, less than 10%. Left chest tube in place.  2. Bibasilar atelectasis and pneumonia. Underlying mass is not  excluded.  3. Small left pleural effusion.  4.  Aortic Atherosclerosis (ICD10-I70.0).  Swallow eval ordered.  Pt has been lethargic throughout the day and is on facemask oxygen  of approx 6 liters.  Family present *daughter reports her po intake has been very poor since her Stroke. She spent time on CIR at [facility] and then transferred to [facility] last Thursday  *Nov 13th.      SLP Plan  Continue with current plan of care          Recommendations  Diet recommendations: NPO (free water  protocol) Liquids provided via: Cup Medication Administration: Via alternative means (can trial crushed in puree if able to maintain alertness) Supervision: Trained caregiver to feed patient;Full supervision/cueing for compensatory strategies Compensations: Slow rate;Small sips/bites Postural Changes and/or Swallow Maneuvers: Seated upright 90 degrees  Oral care QID     Dysphagia, oropharyngeal phase (R13.12)     Continue with current plan of care      [name], MA, CCC-SLP Acute Rehabilitation Services Office Number: [phone]  Wilder [name]  12/18/2023, 12:57 PM

## 2023-12-18 NOTE — Progress Notes (Signed)
 Patient unable to swallow PO pills (sodium bicarbonate ). Crushed and given in very small sips of water , unsure if whole dose received since patient coughs and have to swab mouth clear with any intake.

## 2023-12-18 NOTE — Progress Notes (Signed)
 Progress Note   Patient: [name] FMW:[id number] DOB: 1940/03/12 DOA: 12/15/2023     3 DOS: the patient was seen and examined on 12/16/2023   Brief hospital course: The patient is a 83 yr old woman who presented from SNF for decreased level of responsiveness and altered mental status.  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 In the ED she was initially in respiratory distress. She was found to have a large left pleural effusion. Pulmonology was consulted and chest tube was placed.  At the time of exam the patient remains non-verbal, but awake. Her baseline is to be awake alert and conversant. Son is at bedside.  CT head ws negative for acute infarcts. MRI is pending.  12/17/2023: Patient seen alongside patient's daughter.  No new changes.  MRI brain is nonrevealing.  CO2 is noted to be 15.  VBG done yesterday revealed pH of 7.29, pCO2 of 41 and O2 of 38.  Anion gap is normal.  No diarrhea.  12/18/2023: Patient seen alongside patient's daughter and granddaughter.  No significant improvement is noted.  Palliative care team to assist in defining goals of care.  Guarded prognosis.  Assessment and Plan: Altered mental status: - History of prior CVA, lupus, and stage IIIb lung cancer with CT brain that was negative for acute infarct.  12/17/2023: MRI brain has not shown any acute findings.  Likely metabolic acidosis noted, CO2 of 15.  Etiology uncertain.  Acute hypoxic respiratory failure  with large pleural effusion: - Chest tube placed by pulmonology in the ED. 2L removed.Pulmonology following and fluid sent for studies. -Pleural fluid 1800 nucleated cells, 82% neutrophil.  Microbiology negative to date.  LDH of 1120.  Total protein of 4.2 (serum protein of 5.9)  Likely metabolic acidosis: - See above documentation) - CO2 of 15. - pH of 7.29. - Normal anion gap. - No  history of diarrhea. -No history of liver disease i.e. liver cirrhosis.  However, AST was 53 and ALT 28 (AST/ALT ratio is close to 2).  No imaging of the liver visualized.  Platelet count of 42-72 noted (will check HIT antibody). - Start sodium bicarbonate  1300 mg p.o. twice daily.  Thrombocytopenia: - See above documentation -Check HIT antibody. - Platelet count of 42-72. - No bleeding noted. - Low threshold to consider liver ultrasound.  DM II: Follow glucoses with FSBS and SSI.  Hypertension: Blood pressures are currently normotensive   Subjective:  - No new complaints. - Minimal history from patient due to recent acute CVA with right-sided hemiplegia.  Physical Exam: Vitals:   12/17/23 2300 12/18/23 0315 12/18/23 0401 12/18/23 1334  BP:   (!) 143/47 (!) 140/58  Pulse:   93 (!) 104  Resp:   16 18  Temp:   97.8 F (36.6 C) 98.3 F (36.8 C)  TempSrc:    Oral  SpO2: 94%  100% 96%  Weight:   57.2 kg   Height:  5' 6 (1.676 m)     Exam:  Constitutional:  The patient is awake, alert.  Patient verbalizes, however, minimal, due to recent acute CVA.  Dysarthria is noted.  Right-sided hemiplegia. Respiratory:  Clear to auscultation. Cardiovascular:  S1-S2.SABRA Abdomen:  Abdomen is soft, non-tender.  Neurologic:  Right-sided hemiaplasia, with speech impairment.   Data Reviewed:  CBC, BMP, CT chest  Family Communication: Son at bedside  Disposition: Status is: Inpatient  Remains inpatient appropriate because: Medically complex patient requiring close monitoring, subspecialty support, and MRI.  Planned Discharge Destination: Skilled nursing facility    Time spent: 35 minutes  Author: Leatrice LILLETTE Chapel, MD 12/16/2023 6:34 PM  For on call review [email or web address].

## 2023-12-19 ENCOUNTER — Inpatient Hospital Stay (HOSPITAL_COMMUNITY)

## 2023-12-19 DIAGNOSIS — R4589 Other symptoms and signs involving emotional state: Secondary | ICD-10-CM | POA: Diagnosis not present

## 2023-12-19 DIAGNOSIS — Z79899 Other long term (current) drug therapy: Secondary | ICD-10-CM

## 2023-12-19 DIAGNOSIS — Z7189 Other specified counseling: Secondary | ICD-10-CM

## 2023-12-19 DIAGNOSIS — J849 Interstitial pulmonary disease, unspecified: Secondary | ICD-10-CM | POA: Diagnosis not present

## 2023-12-19 DIAGNOSIS — J90 Pleural effusion, not elsewhere classified: Secondary | ICD-10-CM | POA: Diagnosis not present

## 2023-12-19 DIAGNOSIS — R9431 Abnormal electrocardiogram [ECG] [EKG]: Secondary | ICD-10-CM | POA: Diagnosis not present

## 2023-12-19 DIAGNOSIS — Z711 Person with feared health complaint in whom no diagnosis is made: Secondary | ICD-10-CM

## 2023-12-19 DIAGNOSIS — Z515 Encounter for palliative care: Secondary | ICD-10-CM | POA: Diagnosis not present

## 2023-12-19 DIAGNOSIS — R52 Pain, unspecified: Secondary | ICD-10-CM

## 2023-12-19 LAB — LEGIONELLA PNEUMOPHILA SEROGP 1 UR AG: L. pneumophila Serogp 1 Ur Ag: NEGATIVE

## 2023-12-19 LAB — GLUCOSE, CAPILLARY: Glucose-Capillary: 116 mg/dL — ABNORMAL HIGH (ref 70–99)

## 2023-12-19 LAB — VITAMIN B1: Vitamin B1 (Thiamine): 172.9 nmol/L (ref 66.5–200.0)

## 2023-12-19 LAB — HEPARIN INDUCED PLATELET AB (HIT ANTIBODY): Heparin Induced Plt Ab: 0.09 {OD_unit} (ref 0.000–0.400)

## 2023-12-19 MED ORDER — BIOTENE DRY MOUTH MT LIQD: 15.0000 mL | OROMUCOSAL | Status: DC | PRN

## 2023-12-19 MED ORDER — MORPHINE SULFATE (PF) 2 MG/ML IV SOLN
2.0000 mg | INTRAVENOUS | Status: DC | PRN
  Administered 2023-12-19: 2 mg via INTRAVENOUS
  Filled 2023-12-19: qty 1

## 2023-12-19 MED ORDER — LORAZEPAM 2 MG/ML IJ SOLN: 1.0000 mg | INTRAMUSCULAR | Status: DC | PRN

## 2023-12-19 MED ORDER — MORPHINE SULFATE (PF) 4 MG/ML IV SOLN
4.0000 mg | INTRAVENOUS | Status: DC | PRN
  Administered 2023-12-19 – 2023-12-21 (×12): 4 mg via INTRAVENOUS
  Filled 2023-12-19 (×13): qty 1

## 2023-12-19 MED ORDER — GLYCOPYRROLATE 0.2 MG/ML IJ SOLN: 0.2000 mg | INTRAMUSCULAR | Status: DC | PRN

## 2023-12-19 NOTE — Progress Notes (Signed)
 Daily Progress Note   Patient Name: [name]       Date: 12/19/2023 DOB: April 02, 1940  Age: 83 y.o. MRN#: [id number] Attending Physician: Rosario Leatrice FERNS, MD Primary Care Physician: [name], MD Admit Date: 12/15/2023 Length of Stay: 4 days  Reason for Consultation/Follow-up: Establishing goals of care  Subjective:   Reviewed EMR including recent documentation from hospitalist, SLP, and PCCM.  At time of EMR review on past 24 hours patient has required as needed IV morphine  1 mg x 6 doses.  Yesterday patient had 385 cc of bloody output from chest tube concerning for complicated parapneumonic effusion.  Discussed care with bedside RN for updates.  Patient having worsening pain and moaning and yelling out overnight and this morning.  As needed IV morphine  1 mg no longer assisting with management.  Appropriately increased slightly and noted would follow-up with family who RN states are at bedside.  ------------------------------------------------------------------------------------------------------------- Advance Care Planning Conversation  Pertinent diagnosis: CVA with residual right-sided hemiplegia, history of breast cancer status postmastectomy, HFpEF, CAD, NSTEMI, SIADH, stage IIIb malignant neoplasm of bronchus of the lung undergoing radiation therapy, emphysema/ILD, atrial flutter, diabetes mellitus, GERD, hypertension, hypothyroidism, stage II sacral wound POA, SLE, pneumonia with parapneumonic effusion  The patient and family consented to a voluntary Advance Care Planning Conversation in person and over the phone. Individuals present for the conversation: Patient unable to participate in complex medical decision making due to underlying medical status.  This provider discussed care with multiple members of patient's family in person and over the phone including patient's daughter, sister, friend, son, and granddaughter.  Summary of the conversation:  Presented to bedside to  meet with family.  Family asked to speak in separate area away from patient.  Went to separate area to privately discussed care with patient's sister, daughter, and friend in person along with patient's granddaughter and son over the phone.  We discussed that despite appropriate medical interventions, patient has continued to medically deteriorate.  Family described how patient overnight had continued signs of pain and was calling out seeing her own mother.  Normalized patient reaching end-of-life.  Discussed care moving forward.  Spent time explaining transition to comfort focused care at end-of-life including discontinuing interventions such as IV fluids, antibiotics, lab work, and imaging and instead would provide medications for pain, anxiety, and nausea and vomiting at end-of-life.  Expressed concern that time is getting short and likely in terms of hours to days.  Patient has had no oral intake and is requiring more frequent medications for symptom management at end-of-life.  Spent time answering questions regarding this.  All family agreeing with transition to comfort focused care at this time.  They do not want patient to suffer at the end of life as she has already been through so much.  Discussed would also reach out to PCCM about chest tube removal as do not want that to cause discomfort for patient.  Family agreeing with this as well.  Also discussed reaching out to hospice about possible inpatient hospice for end-of-life care.  Family requested that patient have referral to [facility] to remain here in [place] for inpatient hospice.  Noted would involve TOC and reach out to [facility] liaison accordingly.   Spent time providing emotional support via active listening.  Family appropriately tearful during discussion.  Encouraged family members come to visit at this time.  All questions answered at that time.  Noted palliative medicine team to continue to follow along with patient's medical  journey.  Outcome of the conversations and/or documents completed:  Transition to full comfort focused care at this time.  I spent 35 minutes providing separately identifiable ACP services with the patient and/or surrogate decision maker in a voluntary, in-person conversation discussing the patient's wishes and goals as detailed in the above note.  Tinnie Radar, DO Palliative Medicine Provider  -------------------------------------------------------------------------------------------------------------  Discussed care with hospitalist, PCCM, bedside RN, TOC, and ACC liaison to coordinate transition to comfort focused care and referral to [facility] for inpatient hospice evaluation at [facility].  PCCM noted appropriate for chest tube removal at this time.  Objective:   Vital Signs:  BP (!) 127/56 (BP Location: Left Arm)   Pulse 91   Temp 98.1 F (36.7 C) (Oral)   Resp 14   Ht 5' 6 (1.676 m)   Wt 57.2 kg   SpO2 97%   BMI 20.35 kg/m   Physical Exam: General: ill appearing, frail, cachectic, unresponsive Cardiovascular: RRR Respiratory: no increased work of breathing noted, not in respiratory distress Abdomen: not distended Neuro: Unresponsive though reported to be moaning out at times  Assessment & Plan:   Assessment: Patient is an 83 year old female with a past medical history of CVA with residual right-sided hemiplegia, history of breast cancer status postmastectomy, HFpEF, CAD, NSTEMI, SIADH, 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  Upon admission, patient initially required support with BiPAP and imaging obtained noted large left pleural effusion  which required chest tube placement.  PCCM and oncology consulted for recommendations.  Since admission patient has continued to receive workup for acute respiratory failure secondary to pleural effusion and altered mental status.  Palliative medicine team consulted to assist with complex medical decision making.   Recommendations/Plan: # Complex medical decision making/goals of care:  -Patient unable to participate in medical decision making secondary to medical status.  -Spoke with multiple members of patient's family including patient's son, daughter, granddaughter, sister, and friend to discuss care planning moving forward.  Despite appropriate medical interventions, patient has continued to deteriorate.  At this time discussed care pathways moving forward including transition to comfort focused care at end-of-life.  Family agreeing with transition to full comfort focused care as they do not want patient to suffer anymore after all she has gone through.  Involving TOC and ACC liaison for referral for inpatient hospice at [facility] as per patient's family's request.  Out of medicine team continuing to follow with patient's medical journey.  -At this time we will discontinue interventions that are no longer focused on comfort such as IV fluids, imaging, or lab work.  Will instead focus on symptom management of pain, dyspnea, and agitation in the setting of end-of-life care.    Code Status: Do not attempt resuscitation (DNR) - Comfort care  # Symptom management Patient is receiving these palliative interventions for symptom management with an intent to improve quality of life.     -Pain/Dyspnea, acute in the setting of end-of-life care                               -Start IV morphine  4 mg every hour as needed.  Continue to adjust based on  patient's symptom burden.  If patient needing frequent dosing, may need to consider continuous infusion.                  -Anxiety/agitation, in the setting of  end-of-life care                               -Start IV Ativan  0.5 mg every 4 hours as needed. Continue to adjust based on patient's symptom burden.                    -Secretions, in the setting of end-of-life care                               -Start IV glycopyrrolate  0.2 mg every 4 hours as needed.  # Psycho-social/Spiritual Support:  - Support System: Daughter, son, granddaughter   # Discharge Planning: Referral for inpatient hospice evaluation.  Transition to full comfort focused care.  If not transferred to inpatient hospice, in-hospital death anticipated.  Thank you for allowing the palliative care team to participate in the care [name].  Tinnie Radar, DO Palliative Care Provider PMT # [phone]  If patient remains symptomatic despite maximum doses, please call PMT at [phone] between 0700 and 1900. Outside of these hours, please call attending, as PMT does not have night coverage.

## 2023-12-19 NOTE — Progress Notes (Signed)
 [facility] Room 1445  [facility] Liaison Note  Referral received from [facility] for family interest in [facility].   Met with family in waiting room to explain services and hospice philosophy and all questions answered.   [facility] is able to accept patient, however at this time, daughter has declined bed offer. She states she feels that patient is declining rapidly and would prefer to keep patient in hospital.  Updated attending and [facility] via Epic chat.  Thank you for the opportunity to participate in this patient's care   Nat Babe, BSN, RN Hospice Nurse Liaison [phone]

## 2023-12-19 NOTE — TOC Progression Note (Signed)
 Transition of Care [facility]) - Progression Note    Patient Details  Name: [name] MRN: [id number] Date of Birth: Jan 24, 1941  [facility] CM/SW Contact  Sonda Manuella Quill, RN Phone Number: 12/19/2023, 2:25 PM  Clinical Narrative:    IP CM consult received for residential hospice; family selected [facility] per Dr Clayton note; pt offered bed but dtr declined; IP CM is following.     Barriers to Discharge: Continued Medical Work up               Expected Discharge Plan and Services                                               Social Drivers of Health (SDOH) Interventions SDOH Screenings   Food Insecurity: Patient Unable To Answer (12/17/2023)  Recent Concern: Food Insecurity - Food Insecurity Present (11/09/2023)  Housing: Patient Unable To Answer (12/17/2023)  Recent Concern: Housing - High Risk (11/09/2023)  Transportation Needs: Patient Unable To Answer (12/17/2023)  Utilities: Patient Unable To Answer (12/17/2023)  Depression (PHQ2-9): Low Risk  (08/20/2023)  Social Connections: Unknown (12/17/2023)  Tobacco Use: Unknown (12/15/2023)  Recent Concern: Tobacco Use - High Risk (09/28/2023)    Readmission Risk Interventions     No data to display

## 2023-12-19 NOTE — Plan of Care (Signed)
Patient transitioned to comfort care. 

## 2023-12-19 NOTE — Progress Notes (Signed)
 Progress Note   Patient: [name] FMW:[id number] DOB: 03/16/1940 DOA: 12/15/2023     4 DOS: the patient was seen and examined on 12/16/2023   Brief hospital course: The patient is a 83 yr old woman who presented from SNF for decreased level of responsiveness and altered mental status.  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 In the ED she was initially in respiratory distress. She was found to have a large left pleural effusion. Pulmonology was consulted and chest tube was placed.  At the time of exam the patient remains non-verbal, but awake. Her baseline is to be awake alert and conversant. Son is at bedside.  CT head ws negative for acute infarcts. MRI is pending.  12/17/2023: Patient seen alongside patient's daughter.  No new changes.  MRI brain is nonrevealing.  CO2 is noted to be 15.  VBG done yesterday revealed pH of 7.29, pCO2 of 41 and O2 of 38.  Anion gap is normal.  No diarrhea.  12/18/2023: Patient seen alongside patient's daughter and granddaughter.  No significant improvement is noted.  Palliative care team to assist in defining goals of care.  Guarded prognosis.  Assessment and Plan: Altered mental status: - History of prior CVA, lupus, and stage IIIb lung cancer with CT brain that was negative for acute infarct.  12/17/2023: MRI brain has not shown any acute findings.  Likely metabolic acidosis noted, CO2 of 15.  Etiology uncertain.  Acute hypoxic respiratory failure  with large pleural effusion: - Chest tube placed by pulmonology in the ED. 2L removed.Pulmonology following and fluid sent for studies. -Pleural fluid 1800 nucleated cells, 82% neutrophil.  Microbiology negative to date.  LDH of 1120.  Total protein of 4.2 (serum protein of 5.9)  Likely metabolic acidosis: - See above documentation) - CO2 of 15. - pH of 7.29. - Normal anion gap. - No  history of diarrhea. -No history of liver disease i.e. liver cirrhosis.  However, AST was 53 and ALT 28 (AST/ALT ratio is close to 2).  No imaging of the liver visualized.  Platelet count of 42-72 noted (will check HIT antibody). - Start sodium bicarbonate  1300 mg p.o. twice daily.  Thrombocytopenia: - See above documentation -Check HIT antibody. - Platelet count of 42-72. - No bleeding noted. - Low threshold to consider liver ultrasound.  DM II: Follow glucoses with FSBS and SSI.  Hypertension: Blood pressures are currently normotensive   Subjective:  - No new complaints. - Minimal history from patient due to recent acute CVA with right-sided hemiplegia.  Physical Exam: Vitals:   12/18/23 0401 12/18/23 1334 12/18/23 2029 12/19/23 0546  BP: (!) 143/47 (!) 140/58 (!) 126/46 (!) 127/56  Pulse: 93 (!) 104 90 91  Resp: 16 18 14    Temp: 97.8 F (36.6 C) 98.3 F (36.8 C) 97.9 F (36.6 C) 98.1 F (36.7 C)  TempSrc:  Oral Oral Oral  SpO2: 100% 96% 100% 97%  Weight: 57.2 kg     Height:       Exam:  Constitutional:  The patient is awake, alert.  Patient verbalizes, however, minimal, due to recent acute CVA.  Dysarthria is noted.  Right-sided hemiplegia. Respiratory:  Clear to auscultation. Cardiovascular:  S1-S2.SABRA Abdomen:  Abdomen is soft, non-tender.  Neurologic:  Right-sided hemiaplasia, with speech impairment.   Data Reviewed:  CBC, BMP, CT chest  Family Communication: Son at bedside  Disposition: Status is: Inpatient Remains inpatient appropriate because: Medically complex patient requiring close monitoring, subspecialty support, and MRI.  Planned Discharge Destination: Skilled nursing facility {Tip this will not be part of the note when signed  DVT Prophylaxis  .,  (Optional):[id number]}   Time spent: 35 minutes  Author: Leatrice LILLETTE Chapel, MD 12/16/2023 6:34 PM  For on call review [email or web address].

## 2023-12-19 NOTE — Plan of Care (Signed)
  Problem: Clinical Measurements: Goal: Ability to maintain clinical measurements within normal limits will improve Outcome: Progressing Goal: Will remain free from infection Outcome: Progressing Goal: Diagnostic test results will improve Outcome: Progressing Goal: Respiratory complications will improve Outcome: Progressing Goal: Cardiovascular complication will be avoided Outcome: Progressing   Problem: Coping: Goal: Level of anxiety will decrease Outcome: Progressing   Problem: Elimination: Goal: Will not experience complications related to bowel motility Outcome: Progressing Goal: Will not experience complications related to urinary retention Outcome: Progressing   Problem: Pain Managment: Goal: General experience of comfort will improve and/or be controlled Outcome: Progressing   Problem: Safety: Goal: Ability to remain free from injury will improve Outcome: Progressing   Problem: Skin Integrity: Goal: Risk for impaired skin integrity will decrease Outcome: Progressing   Problem: Clinical Measurements: Goal: Ability to maintain a body temperature in the normal range will improve Outcome: Progressing   Problem: Respiratory: Goal: Ability to maintain adequate ventilation will improve Outcome: Progressing Goal: Ability to maintain a clear airway will improve Outcome: Progressing

## 2023-12-20 DIAGNOSIS — J90 Pleural effusion, not elsewhere classified: Secondary | ICD-10-CM | POA: Diagnosis not present

## 2023-12-20 LAB — BPAM PLATELET PHERESIS
Blood Product Expiration Date: 202511232359
ISSUE DATE / TIME: 202511210038
Unit Type and Rh: 7300

## 2023-12-20 LAB — TYPE AND SCREEN
ABO/RH(D): O POS
Antibody Screen: NEGATIVE
Unit division: 0

## 2023-12-20 LAB — CULTURE, BLOOD (ROUTINE X 2)
Culture: NO GROWTH
Culture: NO GROWTH
Special Requests: ADEQUATE
Special Requests: ADEQUATE

## 2023-12-20 LAB — PREPARE PLATELET PHERESIS: Unit division: 0

## 2023-12-20 LAB — BPAM RBC
Blood Product Expiration Date: 202512232359
ISSUE DATE / TIME: 202511210844
Unit Type and Rh: 5100

## 2023-12-20 LAB — BODY FLUID CULTURE W GRAM STAIN: Culture: NO GROWTH

## 2023-12-20 NOTE — Progress Notes (Signed)
  Progress Note   Patient: [name] FMW:[id number] DOB: 05-07-1940 DOA: 12/15/2023     5 DOS: the patient was seen and examined on 12/16/2023   Brief hospital course: The patient is a 83 yr old woman who presented from SNF with decreased responsiveness and altered mental status.  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 CT head ws negative for acute infarcts. MRI head did not reveal any new findings.  Patient continued to deteriorate.  Palliative care team was consulted.  Patient is now comfort cares only.  Assessment and Plan: Altered mental status: Acute hypoxic respiratory failure  with large pleural effusion: Status postplacement and removal of chest tube. Likely metabolic acidosis: Thrombocytopenia: DM II: Hypertension: -Comfort directed care. - Patient remains comfortable.  Subjective:  -No history. - Patient is comfortable.  Physical Exam: Vitals:   12/18/23 0401 12/18/23 1334 12/18/23 2029 12/19/23 0546  BP: (!) 143/47 (!) 140/58 (!) 126/46 (!) 127/56  Pulse: 93 (!) 104 90 91  Resp: 16 18 14    Temp: 97.8 F (36.6 C) 98.3 F (36.8 C) 97.9 F (36.6 C) 98.1 F (36.7 C)  TempSrc:  Oral Oral Oral  SpO2: 100% 96% 100% 97%  Weight: 57.2 kg     Height:       Exam:  Constitutional:  The patient is resting quietly. Respiratory:  Clear to auscultation. Cardiovascular:  S1-S2.SABRA Abdomen:  Abdomen is soft, non-tender.   Family Communication: Daughter, sister and other family members at bedside.  Disposition: Status is: Inpatient Remains inpatient appropriate   Time spent: 35 minutes  Author: Leatrice LILLETTE Chapel, MD 12/16/2023 6:34 PM  For on call review [email or web address].

## 2023-12-20 NOTE — Progress Notes (Signed)
 Daily Progress Note   Patient Name: [name]       Date: 12/20/2023 DOB: March 10, 1940  Age: 83 y.o. MRN#: [id number] Attending Physician: Rosario Leatrice FERNS, MD Primary Care Physician: [name], MD Admit Date: 12/15/2023 Length of Stay: 5 days  Reason for Consultation/Follow-up: Establishing goals of care  Subjective:   Reviewed EMR including recent documentation from hospitalist  Family member at bedside, spiritual care has also provided assistance. Overall appears comfortable, TOC note reviewed, ACC liaison note also reviewed.   Discussed care with hospitalist, PCCM, bedside RN, TOC, and ACC liaison to coordinate transition to comfort focused care and referral to [facility] for inpatient hospice evaluation at [facility].  PCCM noted appropriate for chest tube removal at this time.  Objective:   Vital Signs:  BP (!) 127/56 (BP Location: Left Arm)   Pulse 91   Temp 98.1 F (36.7 C) (Oral)   Resp 14   Ht 5' 6 (1.676 m)   Wt 57.2 kg   SpO2 97%   BMI 20.35 kg/m   Physical Exam: General: ill appearing, frail, cachectic, unresponsive Cardiovascular: RRR Respiratory: no increased work of breathing noted, not in respiratory distress Abdomen: not distended Neuro: Unresponsive    Assessment & Plan:   Assessment: Patient is an 83 year old female with a past medical history of CVA with residual right-sided hemiplegia, history of breast cancer status postmastectomy, HFpEF, CAD, NSTEMI, SIADH, 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  Upon admission, patient initially required support with BiPAP and imaging obtained noted large left pleural effusion  which required chest tube placement.  PCCM and oncology consulted for recommendations.  Since admission patient has continued to receive workup for acute respiratory failure secondary to pleural effusion and altered mental status.  Palliative medicine team consulted to assist with complex medical decision making.   Recommendations/Plan: # Complex medical decision making/goals of care:  -Patient unable to participate in medical decision making secondary to medical status.  -Patient has been transitioned to comfort measures.   -At this time we will discontinue interventions that are no longer focused on comfort such as IV fluids, imaging, or lab work.  Will instead focus on symptom management of pain, dyspnea, and agitation in the setting of end-of-life care.    Code Status: Do not attempt resuscitation (DNR) - Comfort care  # Symptom management Patient is receiving these palliative interventions for symptom management with an intent to improve quality of life.     -Pain/Dyspnea, acute in the setting of end-of-life care                              IV morphine  4 mg every hour as needed.  Continue to adjust based on patient's symptom burden.  If patient needing frequent dosing, may need to consider continuous infusion. PMT to continue to follow symptom management needs. Appreciate chaplain assistance as well.                   -Anxiety/agitation, in the setting of end-of-life care  IV Ativan  0.5 mg every 4 hours as needed. Continue to adjust based on patient's symptom burden.                    -Secretions, in the setting of end-of-life care                             IV glycopyrrolate  0.2 mg every 4 hours as needed.  # Psycho-social/Spiritual Support:  - Support System: Daughter, son, granddaughter   # Discharge Planning:   in-hospital death anticipated.  Thank you for allowing the palliative care team to participate in the care [name].  High MDM Lonia Serve MD.  Palliative Care Provider PMT # [phone]  If patient remains symptomatic despite maximum doses, please call PMT at [phone] between 0700 and 1900. Outside of these hours, please call attending, as PMT does not have night coverage.

## 2023-12-20 NOTE — Progress Notes (Signed)
   12/20/23 1540  Spiritual Encounters  Type of Visit Follow up  Care provided to: Pt and family  Referral source Chaplain assessment  Reason for visit End-of-life   I provided follow up spiritual care support for [name], particularly returning to meet her daughter, [name], who was there with her daughter and grandsons at bedside.  [name] shared the importance of Kathryn Ackroyd participation in her church and the role of [name] faith as a source of strength and meaning. Members of their faith community were also present earlier to show their love and support.  I affirmed their presence together. I invited reflection on Kathryn Somarriba's life and sharing what she found most meaningful. I offered prayer with family consent and let them know how to reach a chaplain 24/7 through their care team.  Bentley Fissel L. Delores HERO.Div

## 2023-12-20 NOTE — Progress Notes (Signed)
 Nutrition Brief Note  Chart reviewed. Pt now transitioning to comfort care.  No further nutrition interventions planned at this time.  Please re-consult as needed.   Shelle Iron RD, LDN Contact via [employer].

## 2023-12-20 NOTE — Progress Notes (Signed)
   12/20/23 1050  Spiritual Encounters  Type of Visit Initial  Care provided to: Pt and family  Referral source Physician  Reason for visit End-of-life  OnCall Visit No  Interventions  Spiritual Care Interventions Made Established relationship of care and support;Compassionate presence;Prayer;Bereavement/grief support   I responded to a spiritual care consult request by Tinnie Radar, DO to offer support as Mrs Mcclarty approaches life transition.  At time of visit, Mrs Nulty appeared comfortable but not responsive verbally. Her cousin, Dajai Wahlert was at bedside. She reported daughter, [name], had gone home for much needed self care.  I provided compassionate presence and active listening, affirming presence of family and acknowledging their [name] faith outlook as source of strength and meaning. Affirmed [name]'s self carte and shared that I would return to offer grief support. I offered prayer with family consent.  Encouraged reaching out through care team for any spiritual care support needs.  Martin Belling L. Delores HERO.Div

## 2023-12-21 DIAGNOSIS — J90 Pleural effusion, not elsewhere classified: Secondary | ICD-10-CM | POA: Diagnosis not present

## 2023-12-21 NOTE — Progress Notes (Signed)
 Daily Progress Note   Patient Name: [name]       Date: 12/21/2023 DOB: 1940-02-11  Age: 83 y.o. MRN#: [id number] Attending Physician: Jillian Buttery, MD Primary Care Physician: [name], MD Admit Date: 12/15/2023 Length of Stay: 6 days  Reason for Consultation/Follow-up: Establishing goals of care  Subjective:   Reviewed EMR including recent documentation from hospitalist  Daughter at bedside, spiritual care has also provided assistance. Overall appears comfortable, TOC note reviewed, [facility] liaison note also reviewed.   Discussed care with hospitalist, bedside RN, TOC, and ACC liaison to coordinate transition to comfort focused care and referral to [facility] for inpatient hospice evaluation at [facility].     Objective:   Vital Signs:  BP (!) 127/56 (BP Location: Left Arm)   Pulse 91   Temp 98.1 F (36.7 C) (Oral)   Resp 14   Ht 5' 6 (1.676 m)   Wt 57.2 kg   SpO2 97%   BMI 20.35 kg/m   Physical Exam: General: ill appearing, frail, cachectic, unresponsive Cardiovascular: RRR Respiratory: mild to moderate increased work of breathing noted, mild respiratory distress Abdomen: not distended Neuro: eyes open, distant gaze   Assessment & Plan:   Assessment: Patient is an 83 year old female with a past medical history of CVA with residual right-sided hemiplegia, history of breast cancer status postmastectomy, HFpEF, CAD, NSTEMI, SIADH, 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  Upon admission, patient initially required support with BiPAP and imaging obtained noted large left pleural effusion which required chest tube placement.  PCCM and  oncology consulted for recommendations.  Since admission patient has continued to receive workup for acute respiratory failure secondary to pleural effusion and altered mental status.  Palliative medicine team consulted to assist with complex medical decision making.   Recommendations/Plan: # Complex medical decision making/goals of care:  -Patient unable to participate in medical decision making secondary to medical status.  -Patient has been transitioned to comfort measures.   -At this time we have discontinued interventions that are no longer focused on comfort such as IV fluids, imaging, or lab work.  Will instead focus on symptom management of pain, dyspnea, and agitation in the setting of end-of-life care.    Code Status: Do not attempt resuscitation (DNR) - Comfort care  # Symptom management Patient is receiving these palliative interventions for symptom management with an intent to improve quality of life.     -Pain/Dyspnea, acute in the setting of end-of-life care                              IV morphine  4 mg every hour as needed.  4-5 doses required in the past 24 hours.  Continue to adjust based on patient's symptom burden.  If patient needing frequent dosing, may need to consider continuous infusion. PMT to continue to follow symptom management needs. Appreciate chaplain assistance as well.                   -Anxiety/agitation, in the setting of end-of-life care  IV Ativan  0.5 mg every 4 hours as needed. Continue to adjust based on patient's symptom burden.                    -Secretions, in the setting of end-of-life care                             IV glycopyrrolate  0.2 mg every 4 hours as needed.  # Psycho-social/Spiritual Support:  - Support System: Daughter, son, granddaughter   # Discharge Planning:   consider transfer to residential hospice if able to be arranged.   Thank you for allowing the palliative care team to participate in the care [name].  High MDM Kathryn Serve MD.  Palliative Care Provider PMT # [phone]  If patient remains symptomatic despite maximum doses, please call PMT at [phone] between 0700 and 1900. Outside of these hours, please call attending, as PMT does not have night coverage.

## 2023-12-21 NOTE — Progress Notes (Signed)
 [name] [address] [facility] hospital liaison note   Referral received from [facility] for family interest in [facility].   Met with patient's daughter [name] in room to explain services and hospice philosophy and all questions answered.  [facility] is able to accept patient this afternoon. Consents have been completed.   RN staff, you may call report at any time to [phone], room is assigned when report is called.  Please leave IV intact and send completed DNR with patient.   Updated attending and [facility] manager via [employer].  Thank you for the opportunity to participate in this patient's care  Amy Darien BSN, RN [facility] Liaison [phone]

## 2023-12-21 NOTE — Plan of Care (Signed)
°  Problem: Education: Goal: Knowledge of General Education information will improve Description: Including pain rating scale, medication(s)/side effects and non-pharmacologic comfort measures Outcome: Not Applicable   Problem: Health Behavior/Discharge Planning: Goal: Ability to manage health-related needs will improve Outcome: Not Applicable   Problem: Clinical Measurements: Goal: Ability to maintain clinical measurements within normal limits will improve Outcome: Not Applicable

## 2023-12-21 NOTE — TOC Transition Note (Signed)
 Transition of Care [facility]) - Discharge Note   Patient Details  Name: [name] MRN: [id number] Date of Birth: 1940/08/26  [facility] CM/SW Contact:  Tawni CHRISTELLA Eva, LCSW Phone Number: 12/21/2023, 9:13 AM   Clinical Narrative:     Pt to be d/c to residential hospice at [facility].PTAR called , ICM sign off  Final next level of care: Hospice Medical Facility Barriers to Discharge: Barriers Resolved   Patient Goals and CMS Choice Patient states their goals for this hospitalization and ongoing recovery are:: residential hospice CMS Medicare.gov Compare Post Acute Care list provided to:: Patient Represenative (must comment) Choice offered to / list presented to : Adult Children      Discharge Placement                Patient to be transferred to facility by: EMS Name of family member notified: Ramonita Rio (Daughter)  [phone] [facility] Phone) Patient and family notified of of transfer: 12/21/23  Discharge Plan and Services Additional resources added to the After Visit Summary for                                       Social Drivers of Health (SDOH) Interventions SDOH Screenings   Food Insecurity: Patient Unable To Answer (12/17/2023)  Recent Concern: Food Insecurity - Food Insecurity Present (11/09/2023)  Housing: Patient Unable To Answer (12/17/2023)  Recent Concern: Housing - High Risk (11/09/2023)  Transportation Needs: Patient Unable To Answer (12/17/2023)  Utilities: Patient Unable To Answer (12/17/2023)  Depression (PHQ2-9): Low Risk  (08/20/2023)  Social Connections: Unknown (12/17/2023)  Tobacco Use: Unknown (12/15/2023)  Recent Concern: Tobacco Use - High Risk (09/28/2023)     Readmission Risk Interventions     No data to display

## 2023-12-21 NOTE — Discharge Summary (Signed)
 Physician Discharge Summary  [name] FMW:[id number] DOB: 01-Apr-1940 DOA: 12/15/2023  PCP: [name], MD  Admit date: 12/15/2023 Discharge date: 12/21/2023  Admitted From: Home Disposition:  Residential Hospice  Discharge Condition:Stable CODE STATUS:comfort care Diet recommendation:regular  Brief/Interim Summary: Patient is a 83 yr old woman who presented from SNF with decreased responsiveness and altered mental status.  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 CT head was negative for acute infarcts. MRI head did not reveal any new findings.  Patient continued to deteriorate.  Palliative care team was consulted.  Goals of care discussed.  Patient's care transition to comfort.Chest tube removed.  Plan for discharge to residential hospice.  Following problems were addressed during the hospitalization:  Altered mental status: - History of prior CVA, lupus, and stage IIIb lung cancer with CT brain that was negative for acute infarct.  12/17/2023: MRI brain has not shown any acute findings.  Now on comfort care   Acute hypoxic respiratory failure  with large pleural effusion: - Chest tube placed by pulmonology in the ED. 2L removed.  Transitioned to comfort care ,so chest tube removed  Other medical problems:Thrombocytopenia,DM II,Hypertension    Discharge Diagnoses:  Principal Problem:   Pleural effusion Active Problems:   UTI (urinary tract infection)   Sepsis due to pneumonia (HCC)   Chronic diastolic CHF (congestive heart failure) (HCC)   History of systemic lupus erythematosus (SLE) (HCC)   ILD (interstitial lung disease) (HCC)   Essential hypertension   Paroxysmal atrial flutter (HCC)    Normocytic anemia   History of lung cancer   Diabetes mellitus type 2, noninsulin dependent (HCC)   Hypothyroidism   Type 2 diabetes mellitus with other specified complication (HCC)   Acute metabolic encephalopathy   Acute respiratory failure with hypoxia (HCC)   History of stroke   Prolonged QT interval   DNR (do not resuscitate)   Altered mental status   Need for emotional support   Counseling and coordination of care   Goals of care, counseling/discussion   Palliative care encounter   High risk medication use   Pain   Concern about end of life   ACP (advance care planning)   Medication management    Discharge Instructions   Allergies as of 12/21/2023       Reactions   Ciprofloxacin Itching, Swelling, Other (See Comments)   Possibly causing tremors?   Crestor  [rosuvastatin ] Other (See Comments)   Myalgia and back pain   Lisinopril  Cough   Ofev  [nintedanib] Nausea Only        Medication List     STOP taking these medications    acetaminophen  325 MG tablet Commonly known as: TYLENOL    albuterol  108 (90 Base) MCG/ACT inhaler Commonly known as: VENTOLIN  HFA   anastrozole  1 MG tablet Commonly known as: ARIMIDEX    apixaban  2.5 MG Tabs tablet Commonly known as: ELIQUIS    aspirin  EC 81 MG tablet   atorvastatin  40 MG tablet Commonly known as: LIPITOR   Baclofen  5 MG Tabs   diclofenac  Sodium 1 % Gel Commonly known as: VOLTAREN    diltiazem  120 MG 24 hr capsule Commonly known as: Cardizem  CD   escitalopram  10 MG tablet Commonly known as: LEXAPRO    feeding supplement Liqd   ferrous gluconate  324 MG  tablet Commonly known as: FERGON   gabapentin  100 MG capsule Commonly known as: NEURONTIN    hydroxychloroquine  200 MG tablet Commonly known as: PLAQUENIL    leptospermum manuka honey Pste paste   levothyroxine  100 MCG tablet Commonly known as: SYNTHROID    lidocaine  5 % Commonly known as: LIDODERM    lip balm ointment   magnesium  hydroxide 400  MG/5ML suspension Commonly known as: MILK OF MAGNESIA   megestrol  400 MG/10ML suspension Commonly known as: MEGACE    methylphenidate  10 MG tablet Commonly known as: RITALIN    multivitamin tablet   nystatin  100000 UNIT/ML suspension Commonly known as: MYCOSTATIN    OVER THE COUNTER MEDICATION   pantoprazole  40 MG tablet Commonly known as: PROTONIX    polyethylene glycol 17 g packet Commonly known as: MIRALAX  / GLYCOLAX    senna-docusate 8.6-50 MG tablet Commonly known as: Senokot-S   sodium chloride  0.65 % Soln nasal spray Commonly known as: OCEAN   sodium chloride  0.9 % infusion   sodium chloride  1 g tablet   sucralfate  1 g tablet Commonly known as: CARAFATE    traMADol  50 MG tablet Commonly known as: ULTRAM         Allergies  Allergen Reactions   Ciprofloxacin Itching, Swelling and Other (See Comments)    Possibly causing tremors?   Crestor  [Rosuvastatin ] Other (See Comments)    Myalgia and back pain   Lisinopril  Cough   Ofev  [Nintedanib] Nausea Only    Consultations: PCCM, palliative care   Procedures/Studies: DG CHEST PORT 1 VIEW Result Date: 12/19/2023 EXAM: 1 VIEW(S) XRAY OF THE CHEST 12/19/2023 04:32:18 AM COMPARISON: 12/18/2023 CLINICAL HISTORY: Pleural effusion FINDINGS: LINES, TUBES AND DEVICES: Left chest tube stable in place. LUNGS AND PLEURA: Diffuse interstitial opacities, most likely pulmonary edema. Small bilateral pleural effusions. Trace residual left apical pneumothorax, unchanged. Worsening aeration to the right lung base. HEART AND MEDIASTINUM: No acute abnormality of the cardiac and mediastinal silhouettes. BONES AND SOFT TISSUES: No acute osseous abnormality. IMPRESSION: 1. Small bilateral pleural effusions. Unchanged. 2. Worsening aeration to the right lung base, which may reflect asymmetric edema versus progressive pneumonia. No change in aeration to the left base. 3. Diffuse increased interstitial markings compatible with edema. 4. Trace  residual left apical pneumothorax, unchanged. Electronically signed by: Waddell Calk MD 12/19/2023 05:31 AM EST RP Workstation: [id number]   DG CHEST PORT 1 VIEW Result Date: 12/18/2023 EXAM: 1 VIEW(S) XRAY OF THE CHEST 12/18/2023 06:52:00 AM COMPARISON: 12/16/2023 CLINICAL HISTORY: Pleural effusion FINDINGS: LINES, TUBES AND DEVICES: Left pleural drainage catheter stable in place. LUNGS AND PLEURA: Small left pleural effusion. Trace left apical pneumothorax, decreased compared to prior exam. Similar hazy bibasilar airspace opacities. Chronic lung changes with fibrosis. HEART AND MEDIASTINUM: No acute abnormality of the cardiac and mediastinal silhouettes. BONES AND SOFT TISSUES: No acute osseous abnormality. IMPRESSION: 1. Small left pleural effusion. 2. Trace left apical pneumothorax, decreased from prior. 3. Left pleural drainage catheter stable in position. Electronically signed by: Waddell Calk MD 12/18/2023 07:30 AM EST RP Workstation: [id number]   CT CHEST WO CONTRAST Result Date: 12/16/2023 CLINICAL DATA:  Pleural effusion.  History of lung cancer. EXAM: CT CHEST WITHOUT CONTRAST TECHNIQUE: Multidetector CT imaging of the chest was performed following the standard protocol without IV contrast. RADIATION DOSE REDUCTION: This exam was performed according to the departmental dose-optimization program which includes automated exposure control, adjustment of the mA and/or kV according to patient size and/or use of iterative reconstruction technique. COMPARISON:  Chest CT dated 06/19/2023. FINDINGS: Evaluation of this exam is limited in the  absence of intravenous contrast ostial as a to respiratory motion. Cardiovascular: There is no cardiomegaly or pericardial effusion. There is advanced coronary vascular calcification and atherosclerotic calcification of the thoracic aorta. No aneurysmal dilatation. The central pulmonary arteries are grossly unremarkable. Mediastinum/Nodes: Bilateral hilar fullness  suboptimally evaluated on this noncontrast CT. Top-normal lymph node in the anterior mediastinum measures 10 mm short axis. The esophagus is grossly unremarkable. There is mediastinal stranding and nodularity. No mediastinal fluid collection. Lungs/Pleura: Diffuse interstitial coarsening. Bibasilar interstitial reticulation and airspace densities as well as focal area of consolidation at the left lung base likely combination of atelectasis and pneumonia. Underlying mass is not excluded. Small left pleural effusion. There is a small left pneumothorax, less than 10%. A left chest tube with tip along the posterior pleural surface. The central airways are patent. Upper Abdomen: No acute abnormality. Musculoskeletal: Osteopenia with degenerative changes of spine. No acute osseous pathology. IMPRESSION: 1. Small left pneumothorax, less than 10%. Left chest tube in place. 2. Bibasilar atelectasis and pneumonia. Underlying mass is not excluded. 3. Small left pleural effusion. 4.  Aortic Atherosclerosis (ICD10-I70.0). Electronically Signed   By: Vanetta Chou M.D.   On: 12/16/2023 16:31   EEG adult Result Date: 12/16/2023 Gregg Lek, MD     12/16/2023  2:20 PM Patient Name: NIKKIA DEVOSS MRN: [id number] Epilepsy Attending: Lek Gregg Referring Physician/Provider: No ref. provider found     Date: 12/16/2023 Duration: 23 minutes Patient history: 83 year old woman with Altered mental status, EEG to rule out seizure Level of alertness: Lethargic AEDs during EEG study: None Technical aspects: This EEG study was done with scalp electrodes positioned according to the 10-20 International system of electrode placement. Electrical activity was reviewed with band pass filter of 1-70Hz , sensitivity of 7 uV/mm, display speed of 35mm/sec with a 60Hz  notched filter applied as appropriate. EEG data were recorded continuously and digitally stored.  Video monitoring was available and reviewed as appropriate. Description: EEG  showed continuous generalized polymorphic sharply contoured 3 to 6 Hz theta-delta slowing. There were also generalized periodic discharges with triphasic morphology. Hyperventilation and photic stimulation were not performed.   ABNORMALITY - Continuous slow, generalized - Periodic discharges with triphasic morphology, generalized ( GPDs) IMPRESSION: This study is suggestive of moderate diffuse encephalopathy, nonspecific etiology but likely related to sedation, toxic-metabolic etiology. No seizures or epileptiform discharges were seen throughout the recording. Lek Gregg MD Neurology    DG Chest Port 1 View Result Date: 12/16/2023 CLINICAL DATA:  Pleural effusion. EXAM: PORTABLE CHEST 1 VIEW COMPARISON:  Radiographs 12/15/2023, 11/19/2023 and 10/30/2023. CT 06/19/2023. FINDINGS: 1016 hours. Small caliber left pleural pigtail catheter remains in place. No change in small left apical pneumothorax. No significant change in residual small left pleural effusion and bibasilar pulmonary opacities, in part secondary to underlying fibrosis. There is no edema or confluent airspace disease. Stable cardiomegaly and aortic atherosclerosis. IMPRESSION: No significant change in small left apical pneumothorax and small left pleural effusion. Stable bibasilar pulmonary opacities, in part secondary to underlying fibrosis. Electronically Signed   By: Elsie Perone M.D.   On: 12/16/2023 11:09   MR BRAIN W WO CONTRAST Result Date: 12/16/2023 EXAM: MRI BRAIN WITH AND WITHOUT CONTRAST 12/16/2023 08:56:09 AM TECHNIQUE: Multiplanar multisequence MRI of the head/brain was performed with and without the administration of intravenous contrast. COMPARISON: Brain MRI 11/06/2023. CLINICAL HISTORY: 83 year old female with history of widely scattered small infarcts in bilateral brain and cerebellum last month. FINDINGS: BRAIN AND VENTRICLES: Stable major vascular flow  voids. Evolution of abnormal diffusion since the MRI last month.  Areas of patchy and more confluent trace diffusion abnormality are now present in the superior hemisphere watershed areas (series 5 image 25), but are no longer restricted on ADC (Apparent Diffusion Coefficient). New areas of ischemia are identified, and punctate foci in the periventricular white matter and involving the left caudate nucleus still appear faintly diffusion restricted (series 5 image 19). Petechial hemorrhage has mildly progressed in the left MCA (Middle Cerebral Artery) and bilateral PCA (Posterior Cerebral Artery) cortical areas (series 10 image 47 on the left). No malignant hemorrhagic transformation. Progressive confluent cytotoxic edema related T2 and FLAIR hyperintensity is present in both hemispheres, but without mass effect, developing cortical encephalomalacia in some areas. No mass effect or midline shift. No hydrocephalus. The sella is unremarkable. No mass or abnormal enhancement. ORBITS: No acute abnormality. SINUSES: No acute abnormality. BONES AND SOFT TISSUES: Normal bone marrow signal and enhancement. No acute soft tissue abnormality. IMPRESSION: 1. No progressive ischemia since last month. Evolution of widely scattered infarcts which were suggestive of embolic etiology. Increased areas of petechial hemorrhage, but no malignant hemorrhagic transformation. No intracranial mass effect. 2. No new intracranial abnormality. Electronically signed by: Helayne Hurst MD 12/16/2023 09:05 AM EST RP Workstation: [id number]   DG Chest Portable 1 View Result Date: 12/15/2023 EXAM: 1 VIEW(S) XRAY OF THE CHEST 12/15/2023 08:55:00 PM COMPARISON: 12/15/2023 CLINICAL HISTORY: post chest tube FINDINGS: LINES, TUBES AND DEVICES: New pigtail left chest tube in left mid lung zone. LUNGS AND PLEURA: Near complete resolution of a trace left pleural effusion. Possible trace left apical pneumothorax. Bilateral lower lung zone fibrosis. Residual atelectasis/consolidation at left lung base. HEART AND  MEDIASTINUM: No acute abnormality of the cardiac and mediastinal silhouettes. BONES AND SOFT TISSUES: No acute osseous abnormality. IMPRESSION: 1. New left pigtail chest tube in appropriate position with near-complete resolution of prior left pleural effusion. Possible trace left apical pneumothorax. 2. Residual left basilar atelectasis/consolidation. Electronically signed by: Morgane Naveau MD 12/15/2023 09:18 PM EST RP Workstation: [id number]   CT HEAD WO CONTRAST Result Date: 12/15/2023 EXAM: CT HEAD WITHOUT CONTRAST 12/15/2023 06:04:15 PM TECHNIQUE: CT of the head was performed without the administration of intravenous contrast. Automated exposure control, iterative reconstruction, and/or weight based adjustment of the mA/kV was utilized to reduce the radiation dose to as low as reasonably achievable. COMPARISON: CT Head Dec 02, 2023 CLINICAL HISTORY: Mental status change, unknown cause FINDINGS: BRAIN AND VENTRICLES: No acute hemorrhage. 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 No evidence of acute large vascular territory infarct. Similar patchy white matter hypodensities, likely chronic microvascular ischemic changes. No hydrocephalus. No extra-axial collection. No mass effect or midline shift. ORBITS: No acute abnormality. SINUSES: No acute abnormality. SOFT TISSUES AND SKULL: No acute soft tissue abnormality. No skull fracture. IMPRESSION: 1. No evidence of acute intracranial abnormality. Please note prior watershed infarcts where occult by CT and an MRI could provide more sensitive evaluation if clinically warranted. 2. 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 Electronically signed by: Gilmore Molt MD 12/15/2023 06:26 PM EST RP Workstation: [id number]   DG Chest Port 1 View Result Date: 12/15/2023 CLINICAL DATA:  Altered mental status EXAM: PORTABLE CHEST 1 VIEW  COMPARISON:  CT chest 06/19/2023 FINDINGS: There is a new large left pleural effusion. There is some patchy opacities in the right lung base. There is no pneumothorax. The heart is enlarged, unchanged. No acute fractures  are seen. IMPRESSION: 1. New large left pleural effusion. 2. Patchy opacities in the right lung base may represent atelectasis or infection. Electronically Signed   By: Greig Pique M.D.   On: 12/15/2023 17:40   CT HEAD WO CONTRAST ( ) Result Date: 12/02/2023 EXAM: CT HEAD WITHOUT CONTRAST 12/02/2023 03:21:42 PM TECHNIQUE: CT of the head was performed without the administration of intravenous contrast. Automated exposure control, iterative reconstruction, and/or weight based adjustment of the mA/kV was utilized to reduce the radiation dose to as low as reasonably achievable. COMPARISON: 11/06/2023 CLINICAL HISTORY: Mental status change, persistent or worsening FINDINGS: BRAIN AND VENTRICLES: No acute hemorrhage. No evidence of acute infarct. Nonspecific periventricular and subcortical white matter hypoattenuation, consistent with chronic microvascular ischemic changes. Prominent ventricles suggesting underlying parenchymal volume loss. No extra-axial collection. No mass effect or midline shift. Atherosclerotic calcifications of carotid siphons. ORBITS: No acute abnormality. SINUSES: Mucosal thickening in right maxillary sinus and left sphenoid sinus. Frothy material in left sphenoid sinus. SOFT TISSUES AND SKULL: No acute soft tissue abnormality. No skull fracture. IMPRESSION: 1. No acute intracranial abnormality. 2. Prominent ventricles suggesting underlying parenchymal volume loss. 3. Nonspecific periventricular and subcortical white matter hypoattenuation consistent with chronic microvascular ischemic changes. 4. Paranasal sinus disease involving the right maxillary and left sphenoid sinuses, with frothy material in the left sphenoid sinus. Electronically signed by: Lonni Necessary MD  12/02/2023 06:58 PM EST RP Workstation: [id number]   DG Cervical Spine 2 or 3 views Result Date: 11/27/2023 CLINICAL DATA:  Left neck pain EXAM: CERVICAL SPINE - 2 VIEW COMPARISON:  CT cervical spine dated 06/19/2023 FINDINGS: There is no evidence of cervical spine fracture or prevertebral soft tissue swelling. Straightening of the cervical lordosis. Again seen are multilevel degenerative changes of the cervical spine characterized by intervertebral disc space narrowing and disc osteophyte formation. IMPRESSION: 1. No acute fracture or traumatic listhesis. 2. Unchanged multilevel degenerative changes of the cervical spine. Electronically Signed   By: Limin  Xu M.D.   On: 11/27/2023 15:12   DG Shoulder Left Result Date: 11/24/2023 CLINICAL DATA:  Pain. EXAM: LEFT SHOULDER - 2+ VIEW COMPARISON:  None Available. FINDINGS: There is no evidence of fracture or dislocation. Mild acromioclavicular degenerative spurring. Subcortical cystic change in the lateral humeral head. No erosion or bony destructive change. Soft tissues are unremarkable. IMPRESSION: 1. Mild acromioclavicular degenerative change. 2. Subcortical cystic change in the lateral humeral head, can be seen with rotator cuff arthropathy. Electronically Signed   By: Andrea Gasman M.D.   On: 11/24/2023 15:02   DG ESOPHAGUS W SINGLE CM (SOL OR THIN BA) Result Date: 11/23/2023 CLINICAL DATA:  Inpatient being treated for recent CVA with c/o dysphagia. Exam limited by mobility and some views limited by kyphotic posture. EXAM: ESOPHAGUS/BARIUM SWALLOW/TABLET STUDY TECHNIQUE: Single contrast examination was performed using thin liquid barium. This exam was performed by Laymon Coast, NP, and was supervised and interpreted by Dr. Johann. Images obtained in LPO and RPO positioning. FLUOROSCOPY: Radiation Exposure Index (as provided by the fluoroscopic device): 15 mGy Kerma COMPARISON:  None Available. FINDINGS: Swallowing: Appears normal. No vestibular  penetration or aspiration seen. Pharynx: Unremarkable. Esophagus: Normal appearance. Esophageal motility: Esophageal dysmotility seen as tertiary contractions resulting in contrast stasis within the mid and distal esophagus. Hiatal Hernia: Small sliding type hiatal hernia seen. Gastroesophageal reflux: None visualized. Ingested 13 mm barium tablet: Passed without significant delay to the stomach. Other: None. IMPRESSION: 1.  Esophageal dysmotility. 2.  Small sliding type hiatal hernia. Electronically Signed  By: JONETTA Faes M.D.   On: 11/23/2023 12:17   DG Swallowing Func-Speech Pathology Result Date: 11/23/2023 Table formatting from the original result was not included. Modified Barium Swallow Study Patient Details Name: ZAKIYAH DIOP MRN: [id number] Date of Birth: [birth year] Today's Date: 11/23/2023 HPI/PMH: HPI: TEIANA HAJDUK is a 83 y.o. 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       CT of the head revealed large hypodensity on the right concerning for large territory infarct.  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  Stroke consistent with embolic.  Patient apparently missed Eliquis  doses. Echo shows EF 60%, increased RVSP at 36.9 mmHg, mild MR, moderate AR. Continue aspirin , statin.  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 S/p radiation, on anastrozole .She is at risk for strokes due to her cancer history. Therapies are recommending intensive rehab. Clinical Impression: Patient presents with a functional oropharyngeal swallow. Oral phase is remarkable for  mild oral residuals and mildly prolonged mastication times (patient with missing dentition) however patient is able to clear w/ additional swallows or liquid wash. Pharyngeal phase is remarkable for complete epiglottic inversion, laryngeal vestibular closure and hyoid excursion. Noted x1 instance of trace penetration, however no other instances of penetration/aspiration across all consistencies tested. Full clearance of pharyngeal space. Patient unable to transit pill from oral cavity, therefore expectorated. Recommend continuation of D3/thin liquid diet with medications crushed in puree. Patient should receive physical and verbal assistance during meals to cue for head up posture. Factors that may increase risk of adverse event in presence of aspiration Noe & Lianne 2021): No data recorded Recommendations/Plan: Swallowing Evaluation Recommendations Swallowing Evaluation Recommendations Recommendations: PO diet PO Diet Recommendation: Dysphagia 3 (Mechanical soft); Thin liquids (Level 0) Liquid Administration via: Straw Medication Administration: Crushed with puree Supervision: Full assist for feeding Swallowing strategies  : Slow rate; Small bites/sips Postural changes: Position pt fully upright for meals Oral care recommendations: Oral care BID (2x/day) Treatment Plan Treatment Plan Treatment recommendations: Therapy as outlined in treatment plan below Follow-up recommendations: Follow physicians's recommendations for discharge plan and follow up therapies Functional status assessment: Patient has had a recent decline in their functional status and demonstrates the ability to make significant improvements in function in a reasonable and predictable amount of time. Treatment frequency: Min 2x/week Treatment duration: 2 weeks Interventions: Compensatory techniques; Patient/family education; Diet toleration management by SLP Recommendations Recommendations for follow up therapy are one component of a  multi-disciplinary discharge planning process, led by the attending physician.  Recommendations may be updated based on patient status, additional functional criteria and insurance authorization. Assessment: Orofacial Exam: Orofacial Exam Oral Cavity - Dentition: Edentulous Orofacial Anatomy: WFL Anatomy: Anatomy: WFL Boluses Administered: Boluses Administered Boluses Administered: Thin liquids (Level 0); Mildly thick liquids (Level 2, nectar thick); Puree; Solid  Oral Impairment Domain: Oral Impairment Domain Lip Closure: Interlabial escape, no progression to anterior lip Tongue control during bolus hold: Cohesive bolus between tongue to palatal seal Bolus preparation/mastication: Slow prolonged chewing/mashing with complete recollection Bolus transport/lingual motion: Brisk tongue motion Oral residue: Trace residue lining oral structures Location of oral residue : Tongue Initiation of pharyngeal swallow : Posterior laryngeal surface of  the epiglottis  Pharyngeal Impairment Domain: Pharyngeal Impairment Domain Soft palate elevation: No bolus between soft palate (SP)/pharyngeal wall (PW) Laryngeal elevation: Complete superior movement of thyroid  cartilage with complete approximation of arytenoids to epiglottic petiole Anterior hyoid excursion: Complete anterior movement Epiglottic movement: Complete inversion Laryngeal vestibule closure: Complete, no air/contrast in laryngeal vestibule Pharyngeal stripping wave : Present - complete Pharyngeal contraction (A/P view only): N/A Pharyngoesophageal segment opening: Complete distension and complete duration, no obstruction of flow Tongue base retraction: No contrast between tongue base and posterior pharyngeal wall (PPW) Pharyngeal residue: Trace residue within or on pharyngeal structures  Esophageal Impairment Domain: Esophageal Impairment Domain Esophageal clearance upright position: Complete clearance, esophageal coating Pill: Pill Consistency administered: --  (expectorated) Penetration/Aspiration Scale Score: Penetration/Aspiration Scale Score 1.  Material does not enter airway: Puree; Solid; Mildly thick liquids (Level 2, nectar thick) 2.  Material enters airway, remains ABOVE vocal cords then ejected out: Thin liquids (Level 0) Compensatory Strategies: Compensatory Strategies Compensatory strategies: Yes Straw: Effective Effective Straw: Thin liquid (Level 0); Mildly thick liquid (Level 2, nectar thick)   General Information: No data recorded Diet Prior to this Study: Dysphagia 3 (mechanical soft); Thin liquids (Level 0)   No data recorded  Respiratory Status: WFL   Supplemental O2: None (Room air)   No data recorded Behavior/Cognition: Alert; Lethargic/Drowsy Self-Feeding Abilities: Able to self-feed Baseline vocal quality/speech: Normal Volitional Cough: Able to elicit Volitional Swallow: Able to elicit Exam Limitations: Poor positioning Goal Planning: Prognosis for improved oropharyngeal function: Good No data recorded No data recorded No data recorded Consulted and agree with results and recommendations: Patient; Nurse Pain: No data recorded End of Session: Start Time:No data recorded Stop Time: No data recorded Time Calculation:No data recorded Charges: No data recorded SLP visit diagnosis: SLP Visit Diagnosis: Dysphagia, unspecified (R13.10) Past Medical History: Past Medical History: Diagnosis Date  Acute bronchitis 12/17/2021  Acute cystitis 02/20/2019  Arthritis   Atrial flutter (HCC)   Breast cancer (HCC)   left  2009 and 2025  Diabetes mellitus without complication (HCC)   Dyspnea   mostly with exertion  GERD (gastroesophageal reflux disease)   History of radiation therapy   Left Lung-08/09/23-10/01/23- Dr. 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[facility])   Lupus   Pneumonia 07/31/2021 Past Surgical History: Past Surgical History: Procedure Laterality Date  BREAST BIOPSY Left 01/15/2023  US  LT BREAST BX W LOC DEV 1ST LESION IMG  BX SPEC US  GUIDE 01/15/2023 [facility] MAMMOGRAPHY  BREAST LUMPECTOMY Left 2009  BRONCHIAL BIOPSY  06/29/2023  Procedure: BRONCHOSCOPY, WITH BIOPSY;  Surgeon: [name], MD;  Location: [facility] ENDOS[facility];  Service: Pulmonary;;  BRONCHIAL BRUSHINGS  06/29/2023  Procedure: BRONCHOSCOPY, WITH BRUSH BIOPSY;  Surgeon: [name], MD;  Location: [facility]OS[facility];  Service: Pulmonary;;  BRONCHIAL NEEDLE ASPIRATION BIOPSY  06/29/2023  Procedure: BRONCHOSCOPY, WITH NEEDLE ASPIRATION BIOPSY;  Surgeon: [name], MD;  Location: [facility]OS[facility];  Service: Pulmonary;;  IR 3D INDEPENDENT WKST  05/29/2021  IR ANGIO INTRA EXTRACRAN SEL COM CAROTID INNOMINATE BILAT MOD SED  05/29/2021  IR ANGIO VERTEBRAL SEL VERTEBRAL BILAT MOD SED  05/29/2021  LEFT HEART CATH AND CORONARY ANGIOGRAPHY N/A 11/01/2023  Procedure: LEFT HEART CATH AND CORONARY ANGIOGRAPHY;  Surgeon: Wonda Sharper, MD;  Location: [facility] INVASIVE CV LAB;  Service: Cardiovascular;  Laterality: N/A;  SIMPLE MASTECTOMY WITH AXILLARY SENTINEL NODE BIOPSY Left 03/31/2023  Procedure: LEFT MASTECTOMY;  Surgeon: Vernetta Berg, MD;  Location: [facility] OR;  Service: General;  Laterality: Left;  LMA PEC BLOCK  VIDEO BRONCHOSCOPY WITH ENDOBRONCHIAL NAVIGATION Left 06/29/2023  Procedure: VIDEO BRONCHOSCOPY WITH ENDOBRONCHIAL NAVIGATION;  Surgeon: [name], MD;  Location: [facility] ENDOSCOPY;  Service: Pulmonary;  Laterality: Left;  VIDEO BRONCHOSCOPY WITH ENDOBRONCHIAL ULTRASOUND Bilateral 06/29/2023  Procedure: BRONCHOSCOPY, WITH EBUS;  Surgeon: [name], MD;  Location: [facility] ENDOSCOPY;  Service: Pulmonary;  Laterality: Bilateral; Cassidi F Sockwell 11/23/2023, 10:22 AM     Subjective: Patient seen at bedside.  On full comfort care.  Eyes open, unresponsive.  Overall comfortable.  Daughter at bedside.  Plan for discharge to residential hospice today  Discharge Exam: Vitals:   12/18/23 2029 12/19/23 0546  BP: (!) 126/46 (!) 127/56  Pulse: 90 91  Resp: 14   Temp: 97.9 F (36.6 C)  98.1 F (36.7 C)  SpO2: 100% 97%   Vitals:   12/18/23 0401 12/18/23 1334 12/18/23 2029 12/19/23 0546  BP: (!) 143/47 (!) 140/58 (!) 126/46 (!) 127/56  Pulse: 93 (!) 104 90 91  Resp: 16 18 14    Temp: 97.8 F (36.6 C) 98.3 F (36.8 C) 97.9 F (36.6 C) 98.1 F (36.7 C)  TempSrc:  Oral Oral Oral  SpO2: 100% 96% 100% 97%  Weight: 57.2 kg     Height:        General: Lying in bed, unresponsive, cachectic, eyes open Cardiovascular: RRR, Respiratory: No obvious wheezing or crackles, increased work of breathing Abdominal: Soft, NT, ND Extremities: no edema, no cyanosis    The results of significant diagnostics from this hospitalization (including imaging, microbiology, ancillary and laboratory) are listed below for reference.     Microbiology: Recent Results (from the past 240 hours)  Blood culture (routine x 2)     Status: None   Collection Time: 12/15/23  5:02 PM   Specimen: BLOOD  Result Value Ref Range Status   Specimen Description   Final    BLOOD BLOOD LEFT ARM Performed at [facility], [address], [place], [place] [address]    Special Requests   Final    BOTTLES DRAWN AEROBIC AND ANAEROBIC Blood Culture adequate volume Performed at [facility], [address] [place], [place] [address]    Culture   Final    NO GROWTH 5 DAYS Performed at [facility], [address] [place], [place] [address]    Report Status 12/20/2023 FINAL  Final  Blood culture (routine x 2)     Status: None   Collection Time: 12/15/23  6:17 PM   Specimen: BLOOD  Result Value Ref Range Status   Specimen Description   Final    BLOOD BLOOD RIGHT ARM Performed at [facility], [address] [place], [place] [address]    Special Requests   Final    BOTTLES DRAWN AEROBIC AND ANAEROBIC Blood Culture adequate volume Performed at [facility], [address] [place], [place] [address]    Culture   Final     NO GROWTH 5 DAYS Performed at [facility], [address], [place], [place] [address]    Report Status 12/20/2023 FINAL  Final  Urine Culture (for pregnant, neutropenic or urologic patients or patients with an indwelling urinary catheter)     Status: None   Collection Time: 12/15/23  8:41 PM   Specimen: Urine, Clean Catch  Result Value Ref Range Status   Specimen Description   Final    URINE, CLEAN CATCH Performed at [facility], [address], [place], [place] [address]    Special Requests   Final    NONE Performed at [facility], [address], [place], [place] [address]    Culture   Final    NO GROWTH Performed at [facility], [address], [place], [place] [address]    Report Status 12/17/2023 FINAL  Final  Body fluid culture w Gram Stain     Status: None   Collection Time: 12/15/23 11:21 PM   Specimen: Pleural Fluid  Result Value Ref Range Status   Specimen Description   Final    PLEURAL EFFUSION Performed at [facility], [address] [place], [place] [address]    Special Requests   Final    NONE Performed at [facility], [address] [place], [place] [address]    Gram Stain RARE WBC SEEN NO ORGANISMS SEEN   Final   Culture   Final    NO GROWTH 3 DAYS Performed at [facility], [address], [place], [place] [address]    Report Status 12/20/2023 FINAL  Final  MRSA Next Gen by PCR, Nasal     Status: None   Collection Time: 12/16/23 12:14 PM   Specimen: Anterior Nasal Swab  Result Value Ref Range Status   MRSA by PCR Next Gen NOT DETECTED NOT DETECTED Final    Comment: (NOTE) The GeneXpert MRSA Assay (FDA approved for NASAL specimens only), is one component of a comprehensive MRSA colonization surveillance program. It is not intended to diagnose MRSA infection nor to guide or monitor treatment for MRSA infections. Test performance is not FDA approved  in patients less than 57 years old. Performed at [facility], [address], [place], [place] [address]   Resp panel by RT-PCR (RSV, Flu A&B, Covid) Anterior Nasal Swab     Status: None   Collection Time: 12/16/23  1:24 PM   Specimen: Anterior Nasal Swab  Result Value Ref Range Status   SARS Coronavirus 2 by RT PCR NEGATIVE NEGATIVE Final    Comment: (NOTE) SARS-CoV-2 target nucleic acids are NOT DETECTED.  The SARS-CoV-2 RNA is generally detectable in upper respiratory specimens during the acute phase of infection. The lowest concentration of SARS-CoV-2 viral copies this assay can detect is 138 copies/mL. A negative result does not preclude SARS-Cov-2 infection and should not be used as the sole basis for treatment or other patient management decisions. A negative result may occur with  improper specimen collection/handling, submission of specimen other than nasopharyngeal swab, presence of viral mutation(s) within the areas targeted by this assay, and inadequate number of viral copies(<138 copies/mL). A negative result must be combined with clinical observations, patient history, and epidemiological information. The expected result is Negative.  Fact Sheet for Patients:  bloggercourse.com  Fact Sheet for Healthcare Providers:  seriousbroker.it  This test is no t yet approved or cleared by the [place]  FDA and  has been authorized for detection and/or diagnosis of SARS-CoV-2 by FDA under an Emergency Use Authorization (EUA). This EUA will remain  in effect (meaning this test can be used) for the duration of the COVID-19 declaration under Section 564(b)(1) of the Act, 21 U.S.C.section 360bbb-3(b)(1), unless the authorization is terminated  or revoked sooner.       Influenza A by PCR NEGATIVE NEGATIVE Final   Influenza B by PCR NEGATIVE NEGATIVE Final    Comment: (NOTE) The Xpert Xpress  SARS-CoV-2/FLU/RSV plus assay is intended as an aid in the diagnosis of influenza from Nasopharyngeal swab specimens and should not be used as a sole basis for treatment. Nasal washings and aspirates are unacceptable for Xpert Xpress SARS-CoV-2/FLU/RSV testing.  Fact Sheet for Patients: bloggercourse.com  Fact Sheet for Healthcare Providers: seriousbroker.it  This test is not yet approved or cleared by the [place]  FDA and has been authorized for detection and/or diagnosis of SARS-CoV-2 by FDA under an Emergency Use Authorization (EUA). This EUA will remain in effect (meaning this test can be used) for the duration of the COVID-19 declaration under Section 564(b)(1) of the Act, 21 U.S.C. section 360bbb-3(b)(1), unless the authorization is terminated or revoked.     Resp Syncytial Virus by PCR NEGATIVE NEGATIVE Final    Comment: (NOTE) Fact Sheet for Patients: bloggercourse.com  Fact Sheet for Healthcare Providers: seriousbroker.it  This test is not yet approved or cleared by the [place]  FDA and has been authorized for detection and/or diagnosis of SARS-CoV-2 by FDA under an Emergency Use Authorization (EUA). This EUA will remain in effect (meaning this test can be used) for the duration of the COVID-19 declaration under Section 564(b)(1) of the Act, 21 U.S.C. section 360bbb-3(b)(1), unless the authorization is terminated or revoked.  Performed at [facility], [address] [place], [place] [address]      Labs: BNP (last 3 results) No results for input(s): BNP in the last 8760 hours. Basic Metabolic Panel: Recent Labs  Lab 12/15/23 1702 12/15/23 1713 12/16/23 1933 12/16/23 2138 12/17/23 0409 12/18/23 0413  NA 137 139 140 140 141 143  K 3.5 3.5 4.0 3.9 3.6 3.5  CL 107 109 112* 112* 112* 114*  CO2 17*  --  14* 15* 15* 16*  GLUCOSE  95 94 121* 124* 120* 131*  BUN 15 14 18 19 20 23   CREATININE 0.72 0.70 0.69 0.72 0.74 0.76  CALCIUM  9.1  --  8.6* 8.8* 8.6* 9.5  MG 2.0  --  2.0  --   --  2.1  PHOS 2.7  --  2.6  --   --  2.1*   Liver Function Tests: Recent Labs  Lab 12/15/23 1702 12/16/23 1933 12/18/23 0413  AST 47* 53*  --   ALT 32 28  --   ALKPHOS 84 71  --   BILITOT 1.0 0.6  --   PROT 6.9 5.9*  --   ALBUMIN 3.3* 2.8* 2.9*   No results for input(s): LIPASE, AMYLASE in the last 168 hours. Recent Labs  Lab 12/15/23 1702  AMMONIA 15   CBC: Recent Labs  Lab 12/15/23 1702 12/15/23 1713 12/16/23 1933 12/16/23 2138 12/17/23 0409 12/17/23 1814 12/18/23 0413  WBC 9.6  --  9.3 9.4 9.2 7.9 9.3  NEUTROABS 7.0  --   --  7.0  --  5.9 7.1  HGB 8.6*   < > 7.5* 7.3* 6.4* 8.8* 9.4*  HCT 27.5*   < > 26.2* 23.7* 20.1* 28.8* 30.5*  MCV 96.5  --  104.4* 98.8 96.6 99.3 100.0  PLT 67*  --  27* 32* 72* 42* 36*   < > = values in this interval not displayed.   Cardiac Enzymes: Recent Labs  Lab 12/15/23 1702  CKTOTAL 152   BNP: Invalid input(s): POCBNP CBG: Recent Labs  Lab 12/18/23 0813 12/18/23 1207 12/18/23 1639 12/18/23 2126 12/19/23 0232  GLUCAP 127* 128* 136* 125* 116*   D-Dimer No results for input(s): DDIMER in the last  72 hours. Hgb A1c No results for input(s): HGBA1C in the last 72 hours. Lipid Profile No results for input(s): CHOL, HDL, LDLCALC, TRIG, CHOLHDL, LDLDIRECT in the last 72 hours. Thyroid  function studies No results for input(s): TSH, T4TOTAL, T3FREE, THYROIDAB in the last 72 hours.  Invalid input(s): FREET3 Anemia work up No results for input(s): VITAMINB12, FOLATE, FERRITIN, TIBC, IRON , RETICCTPCT in the last 72 hours. Urinalysis    Component Value Date/Time   COLORURINE AMBER (A) 12/15/2023 2041   APPEARANCEUR CLOUDY (A) 12/15/2023 2041   LABSPEC 1.020 12/15/2023 2041   PHURINE 5.0 12/15/2023 2041   GLUCOSEU 50 (A) 12/15/2023  2041   HGBUR NEGATIVE 12/15/2023 2041   BILIRUBINUR NEGATIVE 12/15/2023 2041   BILIRUBINUR negative 10/19/2023 1549   KETONESUR 5 (A) 12/15/2023 2041   PROTEINUR 100 (A) 12/15/2023 2041   UROBILINOGEN 0.2 10/19/2023 1549   UROBILINOGEN 0.2 06/20/2014 1842   NITRITE NEGATIVE 12/15/2023 2041   LEUKOCYTESUR NEGATIVE 12/15/2023 2041   Sepsis Labs Recent Labs  Lab 12/16/23 2138 12/17/23 0409 12/17/23 1814 12/18/23 0413  WBC 9.4 9.2 7.9 9.3   Microbiology Recent Results (from the past 240 hours)  Blood culture (routine x 2)     Status: None   Collection Time: 12/15/23  5:02 PM   Specimen: BLOOD  Result Value Ref Range Status   Specimen Description   Final    BLOOD BLOOD LEFT ARM Performed at [facility], [address] [place], [place] [address]    Special Requests   Final    BOTTLES DRAWN AEROBIC AND ANAEROBIC Blood Culture adequate volume Performed at [facility], [address] [place], [place] [address]    Culture   Final    NO GROWTH 5 DAYS Performed at [facility], [address], [place], [place] [address]    Report Status 12/20/2023 FINAL  Final  Blood culture (routine x 2)     Status: None   Collection Time: 12/15/23  6:17 PM   Specimen: BLOOD  Result Value Ref Range Status   Specimen Description   Final    BLOOD BLOOD RIGHT ARM Performed at [facility], [address] [place], [place] [address]    Special Requests   Final    BOTTLES DRAWN AEROBIC AND ANAEROBIC Blood Culture adequate volume Performed at [facility], [address], [place], [place] [address]    Culture   Final    NO GROWTH 5 DAYS Performed at [facility], [address], [place], [place] [address]    Report Status 12/20/2023 FINAL  Final  Urine Culture (for pregnant, neutropenic or urologic patients or patients with an indwelling urinary catheter)     Status: None   Collection Time: 12/15/23   8:41 PM   Specimen: Urine, Clean Catch  Result Value Ref Range Status   Specimen Description   Final    URINE, CLEAN CATCH Performed at [facility], [address], [place], [place] [address]    Special Requests   Final    NONE Performed at [facility], [address] [place], [place] [address]    Culture   Final    NO GROWTH Performed at [facility], [address], [place], [place] [address]    Report Status 12/17/2023 FINAL  Final  Body fluid culture w Gram Stain     Status: None   Collection Time: 12/15/23 11:21 PM  Specimen: Pleural Fluid  Result Value Ref Range Status   Specimen Description   Final    PLEURAL EFFUSION Performed at [facility], [address], [place], [place] [address]    Special Requests   Final    NONE Performed at [facility], [address], [place], [place] [address]    Gram Stain RARE WBC SEEN NO ORGANISMS SEEN   Final   Culture   Final    NO GROWTH 3 DAYS Performed at [facility], [address] [place], [place] [address]    Report Status 12/20/2023 FINAL  Final  MRSA Next Gen by PCR, Nasal     Status: None   Collection Time: 12/16/23 12:14 PM   Specimen: Anterior Nasal Swab  Result Value Ref Range Status   MRSA by PCR Next Gen NOT DETECTED NOT DETECTED Final    Comment: (NOTE) The GeneXpert MRSA Assay (FDA approved for NASAL specimens only), is one component of a comprehensive MRSA colonization surveillance program. It is not intended to diagnose MRSA infection nor to guide or monitor treatment for MRSA infections. Test performance is not FDA approved in patients less than 78 years old. Performed at [facility], [address] [place], [place] [address]   Resp panel by RT-PCR (RSV, Flu A&B, Covid) Anterior Nasal Swab     Status: None   Collection Time: 12/16/23  1:24 PM   Specimen: Anterior Nasal Swab  Result Value  Ref Range Status   SARS Coronavirus 2 by RT PCR NEGATIVE NEGATIVE Final    Comment: (NOTE) SARS-CoV-2 target nucleic acids are NOT DETECTED.  The SARS-CoV-2 RNA is generally detectable in upper respiratory specimens during the acute phase of infection. The lowest concentration of SARS-CoV-2 viral copies this assay can detect is 138 copies/mL. A negative result does not preclude SARS-Cov-2 infection and should not be used as the sole basis for treatment or other patient management decisions. A negative result may occur with  improper specimen collection/handling, submission of specimen other than nasopharyngeal swab, presence of viral mutation(s) within the areas targeted by this assay, and inadequate number of viral copies(<138 copies/mL). A negative result must be combined with clinical observations, patient history, and epidemiological information. The expected result is Negative.  Fact Sheet for Patients:  bloggercourse.com  Fact Sheet for Healthcare Providers:  seriousbroker.it  This test is no t yet approved or cleared by the [place]  FDA and  has been authorized for detection and/or diagnosis of SARS-CoV-2 by FDA under an Emergency Use Authorization (EUA). This EUA will remain  in effect (meaning this test can be used) for the duration of the COVID-19 declaration under Section 564(b)(1) of the Act, 21 U.S.C.section 360bbb-3(b)(1), unless the authorization is terminated  or revoked sooner.       Influenza A by PCR NEGATIVE NEGATIVE Final   Influenza B by PCR NEGATIVE NEGATIVE Final    Comment: (NOTE) The Xpert Xpress SARS-CoV-2/FLU/RSV plus assay is intended as an aid in the diagnosis of influenza from Nasopharyngeal swab specimens and should not be used as a sole basis for treatment. Nasal washings and aspirates are unacceptable for Xpert Xpress SARS-CoV-2/FLU/RSV testing.  Fact Sheet for  Patients: bloggercourse.com  Fact Sheet for Healthcare Providers: seriousbroker.it  This test is not yet approved or cleared by the [place]  FDA and has been authorized for detection and/or diagnosis of SARS-CoV-2 by FDA under an Emergency Use Authorization (EUA). This EUA will remain in effect (  meaning this test can be used) for the duration of the COVID-19 declaration under Section 564(b)(1) of the Act, 21 U.S.C. section 360bbb-3(b)(1), unless the authorization is terminated or revoked.     Resp Syncytial Virus by PCR NEGATIVE NEGATIVE Final    Comment: (NOTE) Fact Sheet for Patients: bloggercourse.com  Fact Sheet for Healthcare Providers: seriousbroker.it  This test is not yet approved or cleared by the [place]  FDA and has been authorized for detection and/or diagnosis of SARS-CoV-2 by FDA under an Emergency Use Authorization (EUA). This EUA will remain in effect (meaning this test can be used) for the duration of the COVID-19 declaration under Section 564(b)(1) of the Act, 21 U.S.C. section 360bbb-3(b)(1), unless the authorization is terminated or revoked.  Performed at [facility], [address] [place], [place] [address]     Please note: You were cared for by a hospitalist during your hospital stay. Once you are discharged, your primary care physician will handle any further medical issues. Please note that NO REFILLS for any discharge medications will be authorized once you are discharged, as it is imperative that you return to your primary care physician (or establish a relationship with a primary care physician if you do not have one) for your post hospital discharge needs so that they can reassess your need for medications and monitor your lab values.    Time coordinating discharge: 40 minutes  SIGNED:   Ivonne Mustache, MD  [facility] 12/21/2023, 9:17 AM Pager [phone]  If 7PM-7AM, please contact night-coverage [email or web address] Password [email or web address]

## 2023-12-21 NOTE — Progress Notes (Addendum)
 Detailed report called to [facility]. Patient transported via [facility] & daughter [name] aware of transport & departed at same time with belongings (she expressed her gratitude to all of the [facility] staff involved in her mothers care). X2 IV's in place at d/c per instructions from palliative team.

## 2023-12-22 LAB — MISCELLANEOUS TEST

## 2023-12-22 LAB — CYTOLOGY - NON PAP

## 2023-12-30 LAB — CYTOLOGY - NON PAP

## 2023-12-31 ENCOUNTER — Telehealth: Payer: Self-pay | Admitting: Pharmacist

## 2023-12-31 NOTE — Progress Notes (Signed)
   12/31/2023  Patient ID: [name], female   DOB: March 16, 1940, 83 y.o.   MRN: [id number]  Per Margarete PCP note, pt passed on 12-27-23 at [facility] place. please mark account thanks.   Sending message to upcoming appointment with doctors to close account appropriately.

## 2023-12-31 NOTE — Progress Notes (Signed)
 Mail forwarded to [employer] address at [address] [place] [address]

## 2024-01-06 ENCOUNTER — Encounter: Admitting: Physical Medicine & Rehabilitation

## 2024-05-04 ENCOUNTER — Ambulatory Visit: Admitting: Hematology and Oncology

## 2024-05-05 ENCOUNTER — Inpatient Hospital Stay: Admitting: Hematology and Oncology
